# Patient Record
Sex: Female | Born: 1940 | Race: White | Hispanic: No | Marital: Single
Health system: Southern US, Community
[De-identification: ages and names within clinical notes are randomized; demographics above are authoritative.]

## PROBLEM LIST (undated history)

## (undated) DIAGNOSIS — M8580 Other specified disorders of bone density and structure, unspecified site: Secondary | ICD-10-CM

## (undated) DIAGNOSIS — I10 Essential (primary) hypertension: Secondary | ICD-10-CM

## (undated) DIAGNOSIS — Z923 Personal history of irradiation: Secondary | ICD-10-CM

## (undated) DIAGNOSIS — I472 Ventricular tachycardia: Secondary | ICD-10-CM

## (undated) DIAGNOSIS — I779 Disorder of arteries and arterioles, unspecified: Secondary | ICD-10-CM

## (undated) DIAGNOSIS — I4819 Other persistent atrial fibrillation: Secondary | ICD-10-CM

## (undated) DIAGNOSIS — C50919 Malignant neoplasm of unspecified site of unspecified female breast: Secondary | ICD-10-CM

## (undated) DIAGNOSIS — E785 Hyperlipidemia, unspecified: Secondary | ICD-10-CM

## (undated) DIAGNOSIS — I5030 Unspecified diastolic (congestive) heart failure: Secondary | ICD-10-CM

## (undated) DIAGNOSIS — I499 Cardiac arrhythmia, unspecified: Secondary | ICD-10-CM

## (undated) DIAGNOSIS — M159 Polyosteoarthritis, unspecified: Secondary | ICD-10-CM

## (undated) DIAGNOSIS — K219 Gastro-esophageal reflux disease without esophagitis: Secondary | ICD-10-CM

## (undated) DIAGNOSIS — I739 Peripheral vascular disease, unspecified: Secondary | ICD-10-CM

## (undated) DIAGNOSIS — C801 Malignant (primary) neoplasm, unspecified: Secondary | ICD-10-CM

## (undated) DIAGNOSIS — R000 Tachycardia, unspecified: Secondary | ICD-10-CM

## (undated) DIAGNOSIS — L409 Psoriasis, unspecified: Secondary | ICD-10-CM

## (undated) HISTORY — DX: Peripheral vascular disease, unspecified: I73.9

## (undated) HISTORY — DX: Essential (primary) hypertension: I10

## (undated) HISTORY — DX: Disorder of arteries and arterioles, unspecified: I77.9

## (undated) HISTORY — PX: FRACTURE SURGERY: SHX138

## (undated) HISTORY — PX: TONSILLECTOMY AND ADENOIDECTOMY: SHX28

## (undated) HISTORY — DX: Ventricular tachycardia: I47.2

## (undated) HISTORY — DX: Polyosteoarthritis, unspecified: M15.9

## (undated) HISTORY — PX: OOPHORECTOMY: SHX86

## (undated) HISTORY — DX: Gastro-esophageal reflux disease without esophagitis: K21.9

## (undated) HISTORY — DX: Malignant (primary) neoplasm, unspecified: C80.1

## (undated) HISTORY — DX: Other specified disorders of bone density and structure, unspecified site: M85.80

## (undated) HISTORY — PX: ABDOMINAL HYSTERECTOMY: SHX81

## (undated) HISTORY — DX: Tachycardia, unspecified: R00.0

## (undated) HISTORY — PX: BREAST EXCISIONAL BIOPSY: SUR124

## (undated) HISTORY — DX: Psoriasis, unspecified: L40.9

## (undated) HISTORY — DX: Unspecified diastolic (congestive) heart failure: I50.30

## (undated) HISTORY — DX: Other persistent atrial fibrillation: I48.19

## (undated) HISTORY — DX: Hyperlipidemia, unspecified: E78.5

---

## 1999-07-21 DIAGNOSIS — C801 Malignant (primary) neoplasm, unspecified: Secondary | ICD-10-CM

## 1999-07-21 DIAGNOSIS — C50919 Malignant neoplasm of unspecified site of unspecified female breast: Secondary | ICD-10-CM

## 1999-07-21 DIAGNOSIS — Z923 Personal history of irradiation: Secondary | ICD-10-CM

## 1999-07-21 HISTORY — DX: Malignant (primary) neoplasm, unspecified: C80.1

## 1999-07-21 HISTORY — PX: BREAST BIOPSY: SHX20

## 1999-07-21 HISTORY — DX: Personal history of irradiation: Z92.3

## 1999-07-21 HISTORY — PX: BREAST LUMPECTOMY: SHX2

## 1999-07-21 HISTORY — DX: Malignant neoplasm of unspecified site of unspecified female breast: C50.919

## 2000-08-17 ENCOUNTER — Encounter: Admission: RE | Admit: 2000-08-17 | Discharge: 2000-11-15 | Payer: Self-pay | Admitting: *Deleted

## 2002-07-20 HISTORY — PX: TOTAL HIP ARTHROPLASTY: SHX124

## 2004-06-03 ENCOUNTER — Ambulatory Visit: Payer: Self-pay | Admitting: Oncology

## 2004-06-25 ENCOUNTER — Ambulatory Visit: Payer: Self-pay | Admitting: Oncology

## 2004-08-12 ENCOUNTER — Ambulatory Visit: Payer: Self-pay | Admitting: Gastroenterology

## 2004-10-19 ENCOUNTER — Inpatient Hospital Stay (HOSPITAL_COMMUNITY): Admission: EM | Admit: 2004-10-19 | Discharge: 2004-10-24 | Payer: Self-pay | Admitting: Emergency Medicine

## 2004-10-20 ENCOUNTER — Encounter (INDEPENDENT_AMBULATORY_CARE_PROVIDER_SITE_OTHER): Payer: Self-pay | Admitting: Cardiology

## 2004-10-23 ENCOUNTER — Encounter (INDEPENDENT_AMBULATORY_CARE_PROVIDER_SITE_OTHER): Payer: Self-pay | Admitting: Specialist

## 2004-10-23 HISTORY — PX: CAROTID ENDARTERECTOMY: SUR193

## 2004-12-22 ENCOUNTER — Ambulatory Visit: Payer: Self-pay | Admitting: Oncology

## 2005-06-08 ENCOUNTER — Ambulatory Visit: Payer: Self-pay | Admitting: Oncology

## 2005-06-23 ENCOUNTER — Ambulatory Visit: Payer: Self-pay | Admitting: Oncology

## 2005-08-04 ENCOUNTER — Ambulatory Visit: Payer: Self-pay | Admitting: Oncology

## 2005-12-18 HISTORY — PX: SHOULDER SURGERY: SHX246

## 2006-01-04 ENCOUNTER — Ambulatory Visit: Payer: Self-pay | Admitting: General Practice

## 2006-02-01 ENCOUNTER — Ambulatory Visit: Payer: Self-pay | Admitting: Gastroenterology

## 2006-02-11 ENCOUNTER — Ambulatory Visit: Payer: Self-pay | Admitting: Gastroenterology

## 2006-03-26 ENCOUNTER — Ambulatory Visit: Payer: Self-pay | Admitting: General Practice

## 2006-05-17 ENCOUNTER — Ambulatory Visit: Payer: Self-pay | Admitting: Gastroenterology

## 2006-06-11 ENCOUNTER — Ambulatory Visit: Payer: Self-pay | Admitting: Unknown Physician Specialty

## 2006-10-19 ENCOUNTER — Inpatient Hospital Stay: Payer: Self-pay | Admitting: Unknown Physician Specialty

## 2006-10-19 ENCOUNTER — Other Ambulatory Visit: Payer: Self-pay

## 2006-10-19 HISTORY — PX: ANKLE FRACTURE SURGERY: SHX122

## 2006-12-07 ENCOUNTER — Encounter: Payer: Self-pay | Admitting: Internal Medicine

## 2006-12-08 ENCOUNTER — Emergency Department: Payer: Self-pay | Admitting: Emergency Medicine

## 2006-12-19 ENCOUNTER — Encounter: Payer: Self-pay | Admitting: Internal Medicine

## 2007-01-18 ENCOUNTER — Encounter: Payer: Self-pay | Admitting: Internal Medicine

## 2007-02-18 ENCOUNTER — Encounter: Payer: Self-pay | Admitting: Internal Medicine

## 2007-03-21 ENCOUNTER — Encounter: Payer: Self-pay | Admitting: Internal Medicine

## 2007-03-29 ENCOUNTER — Ambulatory Visit: Payer: Self-pay | Admitting: Gastroenterology

## 2007-06-29 ENCOUNTER — Ambulatory Visit: Payer: Self-pay | Admitting: Unknown Physician Specialty

## 2007-10-25 ENCOUNTER — Ambulatory Visit: Payer: Self-pay | Admitting: Vascular Surgery

## 2008-05-20 ENCOUNTER — Ambulatory Visit: Payer: Self-pay | Admitting: Oncology

## 2008-06-12 ENCOUNTER — Ambulatory Visit: Payer: Self-pay | Admitting: Oncology

## 2008-06-19 ENCOUNTER — Ambulatory Visit: Payer: Self-pay | Admitting: Oncology

## 2008-07-06 ENCOUNTER — Ambulatory Visit: Payer: Self-pay | Admitting: Unknown Physician Specialty

## 2008-07-20 ENCOUNTER — Ambulatory Visit: Payer: Self-pay | Admitting: Oncology

## 2008-07-24 ENCOUNTER — Ambulatory Visit: Payer: Self-pay | Admitting: Oncology

## 2008-08-20 ENCOUNTER — Ambulatory Visit: Payer: Self-pay | Admitting: Oncology

## 2008-10-18 ENCOUNTER — Ambulatory Visit: Payer: Self-pay | Admitting: Oncology

## 2008-10-23 ENCOUNTER — Ambulatory Visit: Payer: Self-pay | Admitting: Oncology

## 2008-11-17 ENCOUNTER — Ambulatory Visit: Payer: Self-pay | Admitting: Oncology

## 2009-01-29 ENCOUNTER — Ambulatory Visit: Payer: Self-pay | Admitting: Oncology

## 2009-02-17 ENCOUNTER — Ambulatory Visit: Payer: Self-pay | Admitting: Oncology

## 2009-03-12 ENCOUNTER — Ambulatory Visit: Payer: Self-pay | Admitting: Unknown Physician Specialty

## 2009-03-26 ENCOUNTER — Ambulatory Visit: Payer: Self-pay | Admitting: Gastroenterology

## 2009-04-19 ENCOUNTER — Ambulatory Visit: Payer: Self-pay | Admitting: Oncology

## 2009-04-23 ENCOUNTER — Ambulatory Visit: Payer: Self-pay | Admitting: Oncology

## 2009-05-20 ENCOUNTER — Ambulatory Visit: Payer: Self-pay | Admitting: Oncology

## 2009-09-23 ENCOUNTER — Ambulatory Visit: Payer: Self-pay | Admitting: Unknown Physician Specialty

## 2009-10-24 ENCOUNTER — Ambulatory Visit: Payer: Self-pay | Admitting: Vascular Surgery

## 2009-10-29 ENCOUNTER — Ambulatory Visit: Payer: Self-pay | Admitting: Oncology

## 2009-11-17 ENCOUNTER — Ambulatory Visit: Payer: Self-pay | Admitting: Oncology

## 2010-04-19 ENCOUNTER — Ambulatory Visit: Payer: Self-pay | Admitting: Oncology

## 2010-04-29 ENCOUNTER — Ambulatory Visit: Payer: Self-pay | Admitting: Oncology

## 2010-05-20 ENCOUNTER — Ambulatory Visit: Payer: Self-pay | Admitting: Oncology

## 2010-09-25 ENCOUNTER — Ambulatory Visit: Payer: PRIVATE HEALTH INSURANCE | Admitting: Unknown Physician Specialty

## 2010-12-02 NOTE — Procedures (Signed)
CAROTID DUPLEX EXAM   INDICATION:  Carotid disease.   HISTORY:  Diabetes:  No.  Cardiac:  No.  Hypertension:  Yes.  Smoking:  Previous.  Previous Surgery:  Left carotid endarterectomy on 10/23/2004.  CV History:  Currently asymptomatic.  Amaurosis Fugax No, Paresthesias No, Hemiparesis No.                                       RIGHT             LEFT  Brachial systolic pressure:         128               120  Brachial Doppler waveforms:         Normal            Normal  Vertebral direction of flow:        Antegrade         Antegrade  DUPLEX VELOCITIES (cm/sec)  CCA peak systolic                   87                97  ECA peak systolic                   60                84  ICA peak systolic                   80                80  ICA end diastolic                   29                34  PLAQUE MORPHOLOGY:                  Calcific  PLAQUE AMOUNT:                      Mild              None  PLAQUE LOCATION:                    ICA   IMPRESSION:  1. 1% to 39% stenosis of the right internal carotid artery.  2. Patent left carotid endarterectomy site with no left internal      carotid artery stenosis.  3. No significant change noted when compared to the previous      examination on 10/25/2007.   ___________________________________________  Di Kindle. Edilia Bo, M.D.   CH/MEDQ  D:  10/24/2009  T:  10/24/2009  Job:  [id number]

## 2010-12-02 NOTE — Procedures (Signed)
CAROTID DUPLEX EXAM   INDICATION:  Followup, carotid artery disease.   HISTORY:  Diabetes:  No.  Cardiac:  No.  Hypertension:  Yes.  Smoking:  Quit.  Previous Surgery:  Left CEA with DPA on 10/23/04 by Dr. Edilia Bo.  CV History:  Patient has had several syncopal episodes prior to CEA,  asymptomatic now.  Amaurosis Fugax No, Paresthesias No, Hemiparesis No                                       RIGHT             LEFT  Brachial systolic pressure:         122               118  Brachial Doppler waveforms:         Triphasic         Triphasic  Vertebral direction of flow:        Antegrade         Antegrade  DUPLEX VELOCITIES (cm/sec)  CCA peak systolic                   91                96  ECA peak systolic                   89                100  ICA peak systolic                   91                102  ICA end diastolic                   29                36  PLAQUE MORPHOLOGY:                  Calcified         None  PLAQUE AMOUNT:                      Mild              None  PLAQUE LOCATION:                    ICA               None   IMPRESSION:  1. Right internal carotid artery shows evidence of 1-39% stenosis.  2. No left internal carotid artery stenosis, status post carotid      endarterectomy with tortuosity noted distally.   ___________________________________________  Di Kindle. Edilia Bo, M.D.   AS/MEDQ  D:  10/25/2007  T:  10/25/2007  Job:  [id number]

## 2010-12-05 ENCOUNTER — Ambulatory Visit: Payer: PRIVATE HEALTH INSURANCE | Admitting: Gastroenterology

## 2010-12-05 NOTE — H&P (Signed)
[name], [name]            ACCOUNT NO.:  [id number]   MEDICAL RECORD NO.:  [id number]          PATIENT TYPE:  INP   LOCATION:  1831                         FACILITY:  [facility]   PHYSICIAN:  Lonia Blood, M.D.      DATE OF BIRTH:  March 23, 1941   DATE OF ADMISSION:  10/19/2004  DATE OF DISCHARGE:                                HISTORY & PHYSICAL   PRIMARY CARE PHYSICIAN:  Patient is unassigned. She is from [place].   PRESENTING COMPLAINT:  Falls, passing out, and pain in the right foot.   HISTORY OF PRESENT ILLNESS:  This is a 70 year old white female with history  of hypertension, breast cancer for the past four years, and arthritis who  presents with history of falls over the past 10 days. The patient has fallen  three times at least in the past four days all of which coming essentially  as passing out. She had no recollection of events at the time. His passing  out has lasted between 45 seconds to just a few seconds. Each time the  patient has either hit her head on the floor or one instance on the toilet  bowl. The patient has just started Baclofen last week. For the past six  months she has had spasms of her right hand for which she was referred to a  neurologist in [place], Dr. Quintella Reichert. He conducted nerve conduction studies  and EMG tests on her that indicated low normal, according to the patient. He  subsequently placed her on the baclofen last week which he started taking  just a week ago. The patient denied any dizziness, vertigo, nausea, and  vomiting. She says she takes water adequately. Per EMS record, the patient's  blood pressure was in the 80s over 40s when they went to her house. She,  however, denied feeling real dizziness. She is seeing what she calls  darkness or clouding of her vision, but that is about it.   PAST MEDICAL HISTORY:  1.  Hypertension.  2.  Osteoarthritis.  3.  Status post right hip replacement.  4.  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  5.  Psoriasis.  6.  GERD.  7.  Arthritis.  8.  History of breast cancer four years ago. She is currently on tamoxifen.   ALLERGIES:  The patient has no known drug allergies.   MEDICATIONS:  1.  Atacand/HCT 32/12.5 one daily.  2.  Metoprolol 100 mg two and a half tablets daily.  3.  Aciphex 20 mg daily.  4.  Tamoxifen 20 mg daily.  5.  Fosamax 70 mg daily.  6.  Baclofen 10 mg t.i.d.   SOCIAL HISTORY:  The patient lives in [place] with her husband. She has  been pretty active, driving, and she is a Education administrator. Denies any history of  tobacco use.  The patient takes one cocktail at night and one glass of wine  with dinner, but no other alcohol intake.   FAMILY HISTORY:  Mother died of old age.  Father died in his 86s from a  stroke. No family history  of cardiac disease. Some family members have had  cancer, leukemia to be exact.   REVIEW OF SYSTEMS:  Essentially as in HPI. All systems reviewed and stable  except for right leg pain and occasional hip pain. The patient also  complained of some headaches.   PHYSICAL EXAMINATION:  VITAL SIGNS: On exam here, temperature 97.1, blood  pressure 130/69, pulse 67, respiratory rate 20. Orthostatic check shows  blood pressure of 117/68 with a pulse of 70 lying and blood pressure of  104/63 with pulse of 71 sitting, and then 103/58 with a pulse of 59  standing. Respiratory rate 20 and saturation is 100% on room air.  GENERAL: The patient is alert and oriented, very pleasant, and in no acute  distress.  HEENT: PERRL. EOMI.  NECK: Supple with no JVD or lymphadenopathy. No carotid bruits appreciated.  RESPIRATORY: The patient has good air entry bilaterally. No wheezes or  rales.  CARDIOVASCULAR:  Regular rate and rhythm. No murmurs.  ABDOMEN: Soft, nontender, positive bowel sounds.  EXTREMITIES: No significant edema on the left. Mild swelling in the lateral  ankle on the right, which is tender to touch.   SKIN: The patient has psoriatic lesion in the right elbow and bruits.  Lesions on the left elbow.   LABORATORY DATA:  Sodium 140, potassium 4.0, chloride 112, BUN 34, glucose  100, bicarbonate 19. Hemoglobin 12.2.  Creatinine is 1.4. The rest of the  labs are pending.   Head CT without contrast is negative. Spinal CT and C-spine CT were also  negative. X-ray of the right foot showed  hairline fracture in the lateral  part of the malleoli which looks like an avulsion fracture.  EKG shows a  normal sinus rhythm with a rate of 65, normal intervals, normal axis, no ST-  T wave changes.   ASSESSMENT:  This is a 70 year old female with prior history of breast  cancer on tamoxifen and currently on baclofen and metoprolol who is  presenting with syncopal episode as well as a hairline fracture of the right  fibula. The differential diagnosis in this patient is numerous including  cardiac which may include arrhythmias, particularly drug-induced  bradyarrhythmias. It could be an MI, although less likely, also from a  cardiomyopathy.  1.  Neuro.  The patient could have TIAs, but if it is TIA must be of the      brainstem. It could be orthostasis secondary to either her drugs or      relative adrenal insufficiency since the patient has previously been on      steroids and she has had radiation. Other possibilities are some other      drugs themselves like baclofen, although her symptoms have started      before she got on the Baclofen. We will admit the patient to a telemetry      bed, hydrate her, check orthostatics daily. Check cardiac enzymes,      carotid Dopplers, TSH, and fasting lipid panel. Will also check a 2-D      echocardiogram and follow her telemetry tracing daily. Also, decrease      the dose of Baclofen to 5 mg t.i.d. since we cannot stop it abruptly.      Will put a foot splint. If all these fail to not show anything should     consider something like Holter monitoring versus tilt  table testing and      get EP involved.  2.  Avulsion fracture of the right  fibula. Will put the patient in a cast      and continue pain control.  Patient will also need possibly follow-up or      at least with her PCP on discharge.  3.  Hypertension. The patient is currently on metoprolol and Atacand/HCT.      The dose of  the metoprolol seems to be high. The patient is taking      about 250 mg and that will be the reason behind some of her falls. I      will put her on home medications so we will see if that is an issue that      may have been exacerbating and causing the falls. If that is the case we      will change her blood pressure medications and see if it resolves the      problem.  4.  Gastroesophageal reflux disease. I will put the patient on Protonix. She      is taking Aciphex at home.  5.  Osteoporosis. Will continue with the Fosamax and probably calcium      supplement also while she is here in the hospital.  6.  Psoriasis. The patient has been managing her psoriasis conservatively.      She currently does not have any symptoms that warrant except for the      mild lesion that is on the right elbow. I would therefore leave it alone      for now.      LG/MEDQ  D:  10/19/2004  T:  10/19/2004  Job:  [id number]

## 2010-12-05 NOTE — Op Note (Signed)
NAMEJOSILYN, [name]            ACCOUNT NO.:  [id number]   MEDICAL RECORD NO.:  [id number]          PATIENT TYPE:  INP   LOCATION:  4733                         FACILITY:  [facility]   PHYSICIAN:  Di Kindle. Edilia Bo, M.D.DATE OF BIRTH:  02-25-1941   DATE OF PROCEDURE:  10/23/2004  DATE OF DISCHARGE:                                 OPERATIVE REPORT   PREOPERATIVE DIAGNOSIS:  Greater than 80% left carotid stenosis.   POSTOPERATIVE DIAGNOSIS:  Greater than 80% left carotid stenosis.   PROCEDURE:  Left carotid endarterectomy with Dacron patch angioplasty.   SURGEON:  Di Kindle. Edilia Bo, M.D.   ASSISTANT:  Zadie Rhine, P.A.   ANESTHESIA:  General.   INDICATIONS:  This is a 70 year old woman who had been admitted after having  multiple episodes of falls and passing out. In addition, she had had some  unusual symptoms in both upper extremities including some episodes where she  was dropping things with her right hand. Part of her workup included a  carotid duplex scan which showed a greater than 80% left carotid stenosis  with no significant right carotid stenosis. Given the severity of the  stenosis on the left regardless of whether not it was potentially causing  her symptoms, left carotid endarterectomy was recommended in order to lower  her risk of future stroke. The procedure and potential complications  including but not limited to bleeding, nerve injury, stroke (periprocedural  risk 1-2%), MI, or other unpredictable medical problems were all discussed  with the patient and her husband.  All of their questions were answered and  they were agreeable to proceed.   TECHNIQUE:  The patient was taken to the operating room and received a  general anesthetic. The left neck was prepped and draped in the usual  sterile fashion. An incision was made along the anterior border of the  sternocleidomastoid and the dissection carried down to the common carotid  artery which was  dissected free and controlled with a Rumel tourniquet.  Internal carotid artery above the plaque was controlled with a blue vessel  loop. The external carotid artery was controlled with a blue vessel loop.  The patient was then heparinized. Clamps were then placed on the internal,  then the common, and the external carotid artery. Longitudinal arteriotomy  was made in the common carotid artery and this was extended through the  plaque into the internal carotid artery above the plaque. A #10 shunt was  placed into the internal carotid artery, back bled, and then placed into the  common carotid artery, and secured with a Rumel tourniquet. Flow was  reestablished through the shunt. An endarterectomy plane was established  proximally and the plaque was sharply divided. Eversion endarterectomy was  performed of the external carotid artery. Distally, there was a nice taper  in the plaque and no tacking sutures were required. The artery was irrigated  with copious amounts of heparin and dextran and all loose debris removed.  The Dacron patch was then sewn using continuous 6-0 Prolene suture. Prior to  completing the patch closure, the shunt was removed. The arteries were back  bled  and flushed appropriately and the patch closure completed. Flow was  reestablished first to the external carotid artery and then to the internal  carotid artery. At the completion, there was an excellent Doppler signal  distal to the patch and a good pulse. Hemostasis was obtained of the wounds  and the heparin was partially reversed with Protamine. The wound was closed  with a deep layer of 3-0 Vicryl and the platysma was closed with  running 3-0 Vicryl. The skin was closed with a 4-0 subcuticular stitch. A  sterile dressing was applied. The patient tolerated the procedure well and  was transferred to the recovery room in satisfactory condition. All needle  and sponge counts were correct.      CSD/MEDQ  D:   10/23/2004  T:  10/23/2004  Job:  [id number]   cc:   Dr. Garnette Czech

## 2010-12-05 NOTE — Consult Note (Signed)
[name], [name]            ACCOUNT NO.:  [id number]   MEDICAL RECORD NO.:  [id number]          PATIENT TYPE:  INP   LOCATION:  4733                         FACILITY:  [facility]   PHYSICIAN:  Di Kindle. Edilia Bo, M.D.DATE OF BIRTH:  [birth year]   DATE OF CONSULTATION:  10/21/2004  DATE OF DISCHARGE:                                   CONSULTATION   REASON FOR CONSULTATION:  Greater than 80% left carotid stenosis.   HISTORY:  This is a pleasant 70 year old right-handed woman who was admitted  on October 20, 2003 as she had been having multiple episodes of passing out and  falling. Over the last 10 days she has fallen multiple times; at least three  times in the last four days. She has no recollection of these events and she  usually awakens fairly quickly. Most recently, she injured her right leg  when she fell. She has also had problems with spasms in both hands and is  being seen by a neurologist in [place]. She has apparently had fairly  extensive workup for this and had been recently placed on baclofen.   On my questioning she denies any history of previous stroke, TIAs,  expressive or receptive aphasia, or amaurosis fugax.   PAST MEDICAL HISTORY:  1.  Significant for hypertension.  2.  Osteoporosis.  3.  History of breast cancer treated four years ago.  4.  She denies any history of diabetes, hypercholesterolemia, history of      previous myocardial infarction, history of congestive heart failure, or      history of emphysema.   PAST SURGICAL HISTORY:  Significant for previous right hip replacement two  years ago. She also had a left breast lumpectomy with postoperative  radiation therapy for breast cancer four years ago.   SOCIAL HISTORY:  She is married and has four children. She is a Education administrator. She  quit tobacco seven years ago. She had been smoking one pack per day for over  40 years.   ALLERGIES:  Patient that has no known drug allergies.   MEDICATIONS:  1.   Baclofen 10 mg p.o. t.i.d.  2.  Fosamax 70 mg daily.  3.  Tamoxifen 20 mg daily.  4.  Aciphex 20 mg daily.  5.  Metoprolol 100 mg two and a half tablets q.d.  6.  Atacand/HCT 32/12.51 mg with 1 q.d.   FAMILY HISTORY:  There is no history of premature cardiovascular disease.  Mother died of old age. Her father died in his 70s from stroke.   REVIEW OF SYSTEMS:  CARDIOVASCULAR:  She has had no recent chest pain, chest  pressure, palpitations, or arrhythmias. PULMONARY:  She had no bronchitis,  asthma or wheezing. GI: She does have a history of gastroesophageal reflux  disease. SKIN: She has a history of psoriasis. GU: She had no dysuria or  frequency. VASCULAR: She had no claudication, rest pain or nonhealing ulcer.  She has had some leg cramps at night. NEUROLOGICAL:  She has had blackouts  as above but no dizziness, headaches or seizures. Review of systems is  otherwise unremarkable.   PHYSICAL EXAMINATION:  VITAL SIGNS:  Temperature is 99.3, heart rate 86,  blood pressure 126/80.  NECK:  I did not detect any carotid bruits. There is no cervical  lymphadenopathy.  LUNGS:  Lungs are clear bilaterally to auscultation.  CARDIOVASCULAR:  She has a regular rate and rhythm.  ABDOMEN: Soft and nontender. I cannot palpate an aneurysm.  PULSES:  She has palpable femoral and popliteal pulses bilaterally.  EXTREMITIES:  She has a splint on her right leg but a warm, well-perfused  foot. On the left side, she is a palpable dorsalis pedis pulse. Feet are  warm and well-perfused.  NEUROLOGIC: Nonfocal.   LABORATORY DATA:  Carotid duplex scan shows a greater than a 80% left  carotid stenosis. End-diastolic velocity of 140 cm/sec would suggest  probably a 90% stenosis. There is no significant right carotid stenosis and  both vertebral arteries are patent with normally directed flow.   RECOMMENDATIONS:  I explained to the patient that I did not think any of her  symptoms could be attributed to her  left carotid stenosis. Certainly the  cramps in the hands would not fit with this and given that her vertebral  arteries area patent and she has no carotid disease on the right, I do not  think her passing out could be related to this. On my history, she does not  describe any history of TIAs, although on the note, there is some mention of  some transient right arm weakness. Regardless, given that the stenosis  greater than 80%, I have recommended left carotid endarterectomy in order to  lower her risk of future stroke. I have discussed the indications for  surgery, the potential complications including but not limited to bleeding,  perioperative stroke (1-2% risk), nerve injury, MI or, or other  unpredictable medical problems. As on my history, she is asymptomatic. If  her syncope workup needs to be completed before then we could schedule her  surgery electively once this is complete. If on the other hand it is felt  that she is safe for surgery and she would like to proceed, we could  potentially do her surgery later this week. I have, looking at her MRA, does  not look like she is on aspirin so I have added aspirin. She would like to  discuss this with her husband and I will discuss this more with her  tomorrow.      CSD/MEDQ  D:  10/21/2004  T:  10/21/2004  Job:  [id number]

## 2010-12-05 NOTE — Discharge Summary (Signed)
[name], [name]            ACCOUNT NO.:  [id number]   MEDICAL RECORD NO.:  [id number]          PATIENT TYPE:  INP   LOCATION:  3308                         FACILITY:  [facility]   PHYSICIAN:  Danae Chen, M.D.DATE OF BIRTH:  07-28-1940   DATE OF ADMISSION:  10/19/2004  DATE OF DISCHARGE:  10/24/2004                                 DISCHARGE SUMMARY   DISCHARGE DIAGNOSIS:  1.  Left carotid endarterectomy for left internal carotid artery stenosis.  2.  History of multiple falls in the past several weeks.  3.  Nondisplaced fracture of the distal right fibula.  4.  History of osteoporosis.  5.  History of  hypertension.  6.  History of breast cancer treated four years ago and currently on      Tamoxifen.  7.  Hypotension status post surgery, resolved.   DISCHARGE MEDICATIONS:  The patient has had some changes made to her  discharge medicines, she will be changed to Lopressor 12.5 mg p.o. b.i.d.,  new medication added is aspirin 325 mg daily, Fosamax 70 mg p.o. weekly,  AcipHex 20 mg p.o. daily, Tamoxifen 20 mg p.o. daily, Percocet 5/325 1-2  tablets every 4-6 hours as needed for moderate to severe pain, Tylox 1-2  q.4h. as needed for mild pain.  The patient is to discontinue her Atacand  and hydrochlorothiazide until she sees her primary care physician.   DISCHARGE INSTRUCTIONS:  She has a follow up appointment with Dr.  Waverly Ferrari on Wednesday, Nov 19, 2004, at 12:40 p.m.  The patient is  to call her primary care physician, Dr. Neldon Mc for follow up  appointment in one week time where she should be rechecked with her blood  pressure.  Also, to follow up with Dr. Ernest Pine, her primary orthopedist, in  one weeks time for follow up of her nondisplaced fibular fracture.   CONSULTATIONS:  Di Kindle. Edilia Bo, M.D., [facility]   PROCEDURE:  1.  Carotid Dopplers showing no evidence of significant ICA stenosis on the      right and 80% stenosis on the left with  bilateral vertebral antegrade      flow, October 20, 2004.  2.  Head CT with contrast showing no acute abnormality on October 20, 2004.  3.  CT of cervical spine without contrast showing no acute fractures, some      mild disc degeneration and spondylosis.  4.  MRI of the brain with and without contrast showing atrophy and mild      hemispheric small vessel disease, no acute process.  5.  MRA done on October 20, 2004, shows normal intracranial MR angiography, no      posterior circulation insufficiency.  6.  3-view of the right ankle done on October 20, 2004, shows nondisplaced      fracture of the distal fibula, medial malleolus intact, ankle mortise      maintained with some lateral soft tissue swelling.   HOSPITAL COURSE:  The patient is a very pleasant 70 year old white female  who was admitted after having several episodes of syncopal type episodes and  injuries sustained due to this.  She had some right foot pain, x-ray showing  the nondisplaced fracture as above.  A workup ensued and carotid Dopplers  did show significant stenosis of the left internal carotid artery.  Therefore, [facility] was consulted.  They did recommend a carotid endarterectomy  and that was performed on October 21, 2004, without complications.  During her  hospital stay, the patient did not have any recurrent symptoms of syncope,  headache, vision or memory loss.  It was felt that her frequent falls may  have been attributable to a combination of her muscle relaxant with some  orthostatic hypotension from low blood pressure.  Therefore, she was taken  off her ARB and diuretic and her beta blocker dosage was decreased.  An  aspirin was also added to her regimen.  The patient tolerated this well, in  fact, had hypotension that did require vasopressor support prior to  discharge.  At the time of discharge, however, her condition is improved,  she has been weaned off her Dopamine, ambulating well, and blood pressure is  normotensive.   Further instructions on wound care per CVTS, she is to keep  the incision site washed with warm soap and water and to notify the CVTS  office if she notices any drainage or opening of the incision site.  She is  not to drive or lift any heavy objects until she is followed up with CV[facility]  and she is also to ambulate 3-4 times a day and to progress as tolerated.  Regarding her nondisplaced fibular fracture, the patient may ambulate on it  with the boot walker she has currently.  Her primary orthopedist may elect  to repeat x-ray the extremity on follow up and make other recommendations as  needed at the time.  In regards to her follow up with the primary care  physician and her blood pressure medications, adjustments can be made based  on how she is responding to this new regimen of low dose beta blocker for  her hypertension.  In addition, we did discontinue the Baclofen to avoid  that contributing to any further falls.  The patient is cautioned to use her  Percocet and Tylox, that this may also cause some decreased mobility, and to  be careful as the patient does have a history of right hip replacement.  At  the time of discharge, the patient is alert and oriented, ambulatory.  There  are no complaints of headache, there is some slight discomfort over her  surgical site.  She is afebrile, blood pressure 115/50, pulse between 75 and  80, she is saturating 100% on room air.  Lungs clear.  Heart rate is  regular.  Her abdomen is soft.  Neurologically, alert and oriented as noted.  She has 5/5 strength in her bilateral upper and lower extremities.  She has  a normal gait.  No focal neurological abnormalities noted.      RLK/MEDQ  D:  10/24/2004  T:  10/24/2004  Job:  [id number]   cc:   Francesco Sor  [address]  [place]  [place] [address]  Fax: [phone]   Neldon Mc, M.D.

## 2011-04-06 ENCOUNTER — Encounter: Payer: Self-pay | Admitting: Vascular Surgery

## 2011-04-29 ENCOUNTER — Other Ambulatory Visit: Payer: Self-pay

## 2011-04-29 ENCOUNTER — Ambulatory Visit: Payer: Self-pay | Admitting: Vascular Surgery

## 2011-04-30 ENCOUNTER — Ambulatory Visit: Payer: PRIVATE HEALTH INSURANCE | Admitting: Oncology

## 2011-05-21 ENCOUNTER — Ambulatory Visit: Payer: PRIVATE HEALTH INSURANCE | Admitting: Oncology

## 2011-05-26 ENCOUNTER — Encounter: Payer: Self-pay | Admitting: Vascular Surgery

## 2011-05-27 ENCOUNTER — Ambulatory Visit (INDEPENDENT_AMBULATORY_CARE_PROVIDER_SITE_OTHER): Payer: Medicare Other | Admitting: Vascular Surgery

## 2011-05-27 ENCOUNTER — Encounter: Payer: Self-pay | Admitting: Vascular Surgery

## 2011-05-27 ENCOUNTER — Other Ambulatory Visit (INDEPENDENT_AMBULATORY_CARE_PROVIDER_SITE_OTHER): Payer: Medicare Other | Admitting: Vascular Surgery

## 2011-05-27 VITALS — BP 149/82 | HR 64 | Resp 12 | Ht 66.5 in | Wt 128.0 lb

## 2011-05-27 DIAGNOSIS — Z48812 Encounter for surgical aftercare following surgery on the circulatory system: Secondary | ICD-10-CM

## 2011-05-27 DIAGNOSIS — I6529 Occlusion and stenosis of unspecified carotid artery: Secondary | ICD-10-CM | POA: Insufficient documentation

## 2011-05-27 NOTE — Progress Notes (Signed)
[facility]  Patient name: [name] MRN: [id number] DOB: 12-May-1941 Sex: female  CC: followup of carotid disease  HPI: [name] is a 70 y.o. female who underwent a left carotid endarterectomy in April of 2006. She comes in for a routine postoperative duplex scan. As I last saw her 18 months ago she denies any history of stroke, TIAs, expressive or receptive aphasia, or amaurosis fugax. She has a history of hypertension and hyperlipidemia both of which are stable and her current medications.  Unfortunately her husband has developed Alzheimer's and she spends a fair amount of time taking care of him.  Past Medical History  Diagnosis Date  . Arthritis   . Hyperlipidemia   . Hypertension   . GERD (gastroesophageal reflux disease)   . Osteoporosis   . Cancer     breast    History reviewed. No pertinent family history.  SOCIAL HISTORY: History  Substance Use Topics  . Smoking status: Former Smoker -- 1.0 packs/day for 40 years    Types: Cigarettes    Quit date: 07/21/1995  . Smokeless tobacco: Not on file  . Alcohol Use: 7.0 oz/week    14 drink(s) per week    No Known Allergies  Current Outpatient Prescriptions  Medication Sig Dispense Refill  . Calcium Carbonate (CALTRATE 600 PO) Take by mouth 2 (two) times daily.        . Cholecalciferol (VITAMIN D PO) Take by mouth daily.        Marland Kitchen esomeprazole (NEXIUM) 40 MG capsule Take 40 mg by mouth daily before breakfast.        . lisinopril (PRINIVIL,ZESTRIL) 20 MG tablet Take 10 mg by mouth daily.       . metoprolol (TOPROL-XL) 50 MG 24 hr tablet Take 50 mg by mouth 2 (two) times daily.       . Multiple Vitamin (MULTIVITAMIN) capsule Take 1 capsule by mouth daily.        . ranitidine (ZANTAC) 150 MG tablet Take 150 mg by mouth 2 (two) times daily.          REVIEW OF SYSTEMS: Arly.Keller ] denotes positive finding; [  ] denotes negative finding CARDIOVASCULAR:  [ ]  chest pain   [ ]  chest pressure    [ ]  palpitations   [ ]  orthopnea   [ ]  dyspnea on exertion   [ ]  claudication   [ ]  rest pain   [ ]  DVT   [ ]  phlebitis PULMONARY:   [ ]  productive cough   [ ]  asthma   [ ]  wheezing NEUROLOGIC:   [ ]  weakness  [ ]  paresthesias  [ ]  aphasia  [ ]  amaurosis  [ ]  dizziness HEMATOLOGIC:   [ ]  bleeding problems   [ ]  clotting disorders MUSCULOSKELETAL:  [ ]  joint pain   [ ]  joint swelling GASTROINTESTINAL: [ ]   blood in stool  [ ]   hematemesis GENITOURINARY:  [ ]   dysuria  [ ]   hematuria PSYCHIATRIC:  [ ]  history of major depression INTEGUMENTARY:  [ ]  rashes  [ ]  ulcers CONSTITUTIONAL:  [ ]  fever   [ ]  chills  PHYSICAL EXAM: Filed Vitals:   05/27/11 1219 05/27/11 1222  BP: 149/81 149/82  Pulse: 67 64  Resp: 12 12  Height: 5' 6.5" (1.689 m)   Weight: 128 lb (58.06 kg)   SpO2: 99%    Body mass index is 20.35 kg/(m^2). GENERAL: The patient is a well-nourished female, in no  acute distress. The vital signs are documented above. CARDIOVASCULAR: There is a regular rate and rhythm without significant murmur appreciated. I do not detect any carotid bruits. PULMONARY: There is good air exchange bilaterally without wheezing or rales. ABDOMEN: Soft and non-tender with normal pitched bowel sounds.  MUSCULOSKELETAL: There are no major deformities or cyanosis. NEUROLOGIC: No focal weakness or paresthesias are detected. SKIN: There are no ulcers or rashes noted. PSYCHIATRIC: The patient has a normal affect.  DATA:  I have independently interpreted her carotid duplex scan. Shows no evidence of recurrent carotid stenosis on the left. She has minimal plaque on the right side with no significant stenosis on the right. Both vertebral arteries are patent with normally directed flow.  MEDICAL ISSUES: Overall pleased with her progress. I have ordered a followup carotid duplex scan in 18 months. I'll see her back at that time. She knows to call sooner if she has problems.  Chamya Hunton S [facility] [place] Office: [phone]

## 2011-06-03 NOTE — Procedures (Unsigned)
CAROTID DUPLEX EXAM  INDICATION:  Follow up carotid stenosis.  HISTORY: Diabetes:  No. Cardiac:  No. Hypertension:  Yes. Smoking:  Previous. Previous Surgery:  Left carotid endarterectomy on 10/23/2004. CV History:  Asymptomatic. Amaurosis Fugax No, Paresthesias No, Hemiparesis No.                                      RIGHT             LEFT Brachial systolic pressure:         170               160 Brachial Doppler waveforms:         WNL               WNL Vertebral direction of flow:        Antegrade         Antegrade DUPLEX VELOCITIES (cm/sec) CCA peak systolic                   104               91 ECA peak systolic                   115               94 ICA peak systolic                   71                78 ICA end diastolic                   16                22 PLAQUE MORPHOLOGY:                  Calcified         Not visualized PLAQUE AMOUNT:                      Mild              Not visualized PLAQUE LOCATION:                    CCA/ICA           Not visualized  IMPRESSION: 1. Right internal carotid artery stenosis present in the 1% to 39%     range. 2. Left internal carotid artery is patent with history of     endarterectomy, no hyperplasia or hemodynamically significant     plaque is identified. 3. Bilateral external carotid arteries are patent. 4. Bilateral vertebral arteries are patent and antegrade, essentially     unchanged since previous study on 04/07/201.       ___________________________________________ Di Kindle. Edilia Bo, M.D.  SH/MEDQ  D:  05/27/2011  T:  05/27/2011  Job:  [id number]

## 2011-08-05 ENCOUNTER — Ambulatory Visit (INDEPENDENT_AMBULATORY_CARE_PROVIDER_SITE_OTHER): Payer: Medicare Other | Admitting: Internal Medicine

## 2011-08-05 ENCOUNTER — Encounter: Payer: Self-pay | Admitting: Internal Medicine

## 2011-08-05 DIAGNOSIS — E785 Hyperlipidemia, unspecified: Secondary | ICD-10-CM | POA: Insufficient documentation

## 2011-08-05 DIAGNOSIS — I6529 Occlusion and stenosis of unspecified carotid artery: Secondary | ICD-10-CM | POA: Diagnosis not present

## 2011-08-05 DIAGNOSIS — M949 Disorder of cartilage, unspecified: Secondary | ICD-10-CM

## 2011-08-05 DIAGNOSIS — M8580 Other specified disorders of bone density and structure, unspecified site: Secondary | ICD-10-CM

## 2011-08-05 DIAGNOSIS — I10 Essential (primary) hypertension: Secondary | ICD-10-CM | POA: Insufficient documentation

## 2011-08-05 DIAGNOSIS — K219 Gastro-esophageal reflux disease without esophagitis: Secondary | ICD-10-CM

## 2011-08-05 DIAGNOSIS — M810 Age-related osteoporosis without current pathological fracture: Secondary | ICD-10-CM | POA: Insufficient documentation

## 2011-08-05 DIAGNOSIS — M899 Disorder of bone, unspecified: Secondary | ICD-10-CM

## 2011-08-05 NOTE — Assessment & Plan Note (Signed)
Asked her to start 81mg  of ASA daily

## 2011-08-05 NOTE — Progress Notes (Signed)
Subjective:    Patient ID: [name], female    DOB: October 16, 1940, 71 y.o.   MRN: [id number]  HPI Transferring care---Dr Lin Givens has moved  Has HTN for many years Has been under control meds first about 30 years ago  Psoriasis for many years Currently under control Mostly on buttocks and elbows---controlled with topical Rx  Has had long standing hyperlipidemia HDL is 74 Had been on statin but off meds now Low risk profile  GERD is also long standing Controlled with the nexium  Current Outpatient Prescriptions on File Prior to Visit  Medication Sig Dispense Refill  . Calcium Carbonate (CALTRATE 600 PO) Take by mouth 2 (two) times daily.        . Cholecalciferol (VITAMIN D PO) Take by mouth 2 (two) times daily.       Marland Kitchen esomeprazole (NEXIUM) 40 MG capsule Take 40 mg by mouth daily before breakfast.        . lisinopril (PRINIVIL,ZESTRIL) 20 MG tablet Take 10 mg by mouth daily.       . Multiple Vitamin (MULTIVITAMIN) capsule Take 1 capsule by mouth daily.          No Known Allergies  Past Medical History  Diagnosis Date  . Osteoarthrosis involving, or with mention of more than one site, but not specified as generalized, multiple sites   . Cancer     breast  . GERD (gastroesophageal reflux disease)   . Hyperlipidemia   . Hypertension   . Osteopenia     Past Surgical History  Procedure Date  . Carotid endarterectomy 10/23/2004  . Total hip arthroplasty 2004    right  . Breast lumpectomy 2001    left  . Shoulder surgery 6/07    left  . Abdominal hysterectomy   . Ankle fracture surgery 4/08    left---hardware still in place    Family History  Problem Relation Age of Onset  . Hypertension Brother   . Heart disease Neg Hx   . Diabetes Neg Hx     History   Social History  . Marital Status: Married    Spouse Name: N/A    Number of Children: N/A  . Years of Education: N/A   Occupational History  . Marketing     Retired   Social History Main Topics    . Smoking status: Former Smoker -- 1.0 packs/day for 40 years    Types: Cigarettes    Quit date: 07/21/1995  . Smokeless tobacco: Never Used  . Alcohol Use: 7.0 oz/week    14 drink(s) per week  . Drug Use:   . Sexually Active:    Other Topics Concern  . Not on file   Social History Narrative   1 natural child3 adopted childrenArtist---still teaches water colorsHusband has AltzheimersHas living willDaughter Amy is health care POARequests DNR No tube feeds if cognitively unaware   Review of Systems  Constitutional: Negative for fatigue and unexpected weight change.       Weight up and down some---stress with husband Wears seat belt  HENT: Negative for hearing loss, dental problem and tinnitus.   Eyes: Negative for visual disturbance.       Had cataract surgery bilat--with IOL  Respiratory: Negative for cough, chest tightness and shortness of breath.   Cardiovascular: Negative for chest pain, palpitations and leg swelling.  Gastrointestinal: Negative for nausea, vomiting, constipation and blood in stool.       Heartburn controlled on the nexium  Genitourinary: Negative for dysuria and  difficulty urinating.  Musculoskeletal: Positive for arthralgias. Negative for joint swelling.       Uses tylenol prn only  Neurological: Negative for dizziness, syncope, weakness, light-headedness and numbness.  Psychiatric/Behavioral: Positive for sleep disturbance. Negative for dysphoric mood. The patient is not nervous/anxious.        Sleep is variable       Objective:   Physical Exam  Constitutional: She is oriented to person, place, and time. She appears well-developed and well-nourished. No distress.  Neck: Normal range of motion. Neck supple. No thyromegaly present.  Cardiovascular: Normal rate, regular rhythm, normal heart sounds and intact distal pulses.  Exam reveals no gallop.   No murmur heard. Pulmonary/Chest: Effort normal and breath sounds normal. No respiratory distress. She has  no wheezes. She has no rales.  Abdominal: Soft. There is no tenderness.  Musculoskeletal: She exhibits no edema and no tenderness.  Lymphadenopathy:    She has no cervical adenopathy.  Neurological: She is alert and oriented to person, place, and time.  Psychiatric: She has a normal mood and affect. Her behavior is normal. Judgment and thought content normal.          Assessment & Plan:

## 2011-08-05 NOTE — Assessment & Plan Note (Signed)
BP Readings from Last 3 Encounters:  08/05/11 140/80  05/27/11 149/82   Good control  No changes Labs fine in October---reviewed

## 2011-08-05 NOTE — Assessment & Plan Note (Signed)
Controlled with the nexium Will continue

## 2011-08-05 NOTE — Assessment & Plan Note (Signed)
HDL 74 LDL 136  I discussed her vascular history and that statin is indicated She is not excited about going back on but will consider

## 2011-08-05 NOTE — Assessment & Plan Note (Signed)
Has DEXA coming up On vitamin D and calcium Has weight bearing exercise

## 2011-08-06 DIAGNOSIS — N951 Menopausal and female climacteric states: Secondary | ICD-10-CM | POA: Diagnosis not present

## 2011-08-19 ENCOUNTER — Telehealth: Payer: Self-pay | Admitting: Internal Medicine

## 2011-08-19 NOTE — Telephone Encounter (Signed)
Patient states she had tetanus in Feb 2010 and is not going to take a shingles shot because she hasn't had chicken pox.

## 2011-09-03 DIAGNOSIS — H353190 Nonexudative age-related macular degeneration, unspecified eye, stage unspecified: Secondary | ICD-10-CM | POA: Diagnosis not present

## 2011-10-27 ENCOUNTER — Telehealth: Payer: Self-pay | Admitting: Internal Medicine

## 2011-10-27 DIAGNOSIS — Z853 Personal history of malignant neoplasm of breast: Secondary | ICD-10-CM

## 2011-10-27 NOTE — Telephone Encounter (Signed)
Pt is calling to set up her Mammogram. She is a breast cancer survivor, she will need a Diagnostic order put in. She goes to [facility].

## 2011-10-28 DIAGNOSIS — Z853 Personal history of malignant neoplasm of breast: Secondary | ICD-10-CM | POA: Insufficient documentation

## 2011-10-28 NOTE — Telephone Encounter (Signed)
Order put in.

## 2011-11-23 ENCOUNTER — Ambulatory Visit: Payer: Self-pay | Admitting: Gastroenterology

## 2011-11-23 DIAGNOSIS — K2270 Barrett's esophagus without dysplasia: Secondary | ICD-10-CM | POA: Diagnosis not present

## 2011-11-23 DIAGNOSIS — Z79899 Other long term (current) drug therapy: Secondary | ICD-10-CM | POA: Diagnosis not present

## 2011-11-23 DIAGNOSIS — M87059 Idiopathic aseptic necrosis of unspecified femur: Secondary | ICD-10-CM | POA: Diagnosis not present

## 2011-11-23 DIAGNOSIS — D649 Anemia, unspecified: Secondary | ICD-10-CM | POA: Diagnosis not present

## 2011-11-23 DIAGNOSIS — K219 Gastro-esophageal reflux disease without esophagitis: Secondary | ICD-10-CM | POA: Diagnosis not present

## 2011-11-23 DIAGNOSIS — I10 Essential (primary) hypertension: Secondary | ICD-10-CM | POA: Diagnosis not present

## 2011-11-23 DIAGNOSIS — K449 Diaphragmatic hernia without obstruction or gangrene: Secondary | ICD-10-CM | POA: Diagnosis not present

## 2011-11-23 DIAGNOSIS — Z853 Personal history of malignant neoplasm of breast: Secondary | ICD-10-CM | POA: Diagnosis not present

## 2011-11-23 DIAGNOSIS — L408 Other psoriasis: Secondary | ICD-10-CM | POA: Diagnosis not present

## 2011-11-23 DIAGNOSIS — Z8261 Family history of arthritis: Secondary | ICD-10-CM | POA: Diagnosis not present

## 2011-11-23 DIAGNOSIS — B3781 Candidal esophagitis: Secondary | ICD-10-CM | POA: Diagnosis not present

## 2011-11-23 DIAGNOSIS — K209 Esophagitis, unspecified without bleeding: Secondary | ICD-10-CM | POA: Diagnosis not present

## 2011-11-23 DIAGNOSIS — B3784 Candidal otitis externa: Secondary | ICD-10-CM | POA: Diagnosis not present

## 2011-11-23 DIAGNOSIS — Z806 Family history of leukemia: Secondary | ICD-10-CM | POA: Diagnosis not present

## 2011-11-23 DIAGNOSIS — R1310 Dysphagia, unspecified: Secondary | ICD-10-CM | POA: Diagnosis not present

## 2011-11-23 DIAGNOSIS — E785 Hyperlipidemia, unspecified: Secondary | ICD-10-CM | POA: Diagnosis not present

## 2011-11-23 LAB — KOH PREP

## 2011-11-26 LAB — PATHOLOGY REPORT

## 2011-11-27 ENCOUNTER — Ambulatory Visit: Payer: Self-pay | Admitting: Internal Medicine

## 2011-11-27 ENCOUNTER — Encounter: Payer: Self-pay | Admitting: Internal Medicine

## 2011-11-27 DIAGNOSIS — Z853 Personal history of malignant neoplasm of breast: Secondary | ICD-10-CM | POA: Diagnosis not present

## 2011-11-30 ENCOUNTER — Telehealth: Payer: Self-pay

## 2011-11-30 MED ORDER — LORAZEPAM 0.5 MG PO TABS: 0.2500 mg | ORAL_TABLET | Freq: Two times a day (BID) | ORAL | Status: DC | PRN

## 2011-11-30 NOTE — Telephone Encounter (Signed)
Spoke with patient and advised results rx called into pharmacy  

## 2011-11-30 NOTE — Telephone Encounter (Signed)
Pt said attending Alzheimers support group; pt  needs med for anxiousness and depression. Pt said feelings come and go depending on pts husbands alzheimers condition. Pt tried wellbutrin years ago and that caused difficulty sleeping and nightmares. [facility] is pharmacy and pt can be reached at [phone].

## 2011-11-30 NOTE — Telephone Encounter (Signed)
Please let her know that I need to see her in office to discuss before I would start any daily anxiety med. If she would like a small quantity of a tranquilizer for when anxiety really flares, okay to send #6 of lorazepam 0.5mg  With no refill 1/2-1 tab po bid prn for anxiety  Have her set up appt in the next couple of weeks to discuss

## 2011-12-01 ENCOUNTER — Ambulatory Visit (INDEPENDENT_AMBULATORY_CARE_PROVIDER_SITE_OTHER): Payer: Medicare Other | Admitting: Internal Medicine

## 2011-12-01 ENCOUNTER — Encounter: Payer: Self-pay | Admitting: Internal Medicine

## 2011-12-01 ENCOUNTER — Encounter: Payer: Self-pay | Admitting: *Deleted

## 2011-12-01 VITALS — BP 122/62 | HR 80 | Temp 97.2°F | Ht 65.5 in | Wt 125.0 lb

## 2011-12-01 DIAGNOSIS — F411 Generalized anxiety disorder: Secondary | ICD-10-CM

## 2011-12-01 DIAGNOSIS — F418 Other specified anxiety disorders: Secondary | ICD-10-CM

## 2011-12-01 DIAGNOSIS — F39 Unspecified mood [affective] disorder: Secondary | ICD-10-CM | POA: Insufficient documentation

## 2011-12-01 NOTE — Assessment & Plan Note (Signed)
She did try lorazepam 0.25mg  last night----didn't see much effect I don't think her mood issues are amenable to medication treatment No ongoing depression or GAD Has clear caregiver stress  Counseled about half of 25 minute visit on reducing caregiver stress No daily meds Will continue the lorazepam on an as needed basis  Gave caregiver bill of rights

## 2011-12-01 NOTE — Progress Notes (Signed)
Subjective:    Patient ID: [name], female    DOB: 08/15/1940, 71 y.o.   MRN: [id number]  HPI Having stress with caregiving for husband with Altzheimer's He gets upset when she has to drive (he will reach across her to honk horn) Had EGD for Barrett's and she was fearful---"what would happen if something happened to me"  Support group at the [facility]. 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 aspirin 81 MG tablet Take 81 mg by mouth daily.      . Calcium Carbonate (CALTRATE 600 PO) Take by mouth 2 (two) times daily.        . Cholecalciferol (VITAMIN D PO) Take by mouth 2 (two) times daily.       Marland Kitchen esomeprazole (NEXIUM) 40 MG capsule Take 40 mg by mouth daily before breakfast.        . lisinopril (PRINIVIL,ZESTRIL) 20 MG tablet Take 10 mg by mouth daily.       Marland Kitchen LORazepam (ATIVAN) 0.5 MG tablet Take 0.5-1 tablets (0.25-0.5 mg total) by mouth 2 (two) times daily as needed for anxiety.  6 tablet  0  . metoprolol (LOPRESSOR) 50 MG tablet Take 50 mg by mouth 2 (two) times daily.       . Multiple Vitamin (MULTIVITAMIN) capsule Take 1 capsule by mouth daily.          Allergies  Allergen Reactions  . Wellbutrin (Bupropion) Other (See Comments)    problems sleeping and nightmares    Past Medical History  Diagnosis Date  . Osteoarthrosis involving, or with mention of more than one site, but not specified as generalized, multiple sites   . Cancer    breast  . GERD (gastroesophageal reflux disease)   . Hyperlipidemia   . Hypertension   . Osteopenia     Past Surgical History  Procedure Date  . Carotid endarterectomy 10/23/2004  . Total hip arthroplasty 2004    right  . Breast lumpectomy 2001    left  . Shoulder surgery 6/07    left  . Abdominal hysterectomy   . Ankle fracture surgery 4/08    left---hardware still in place    Family History  Problem Relation Age of Onset  . Hypertension Brother   . Heart disease Neg Hx   . Diabetes Neg Hx     History   Social History  . Marital Status: Married    Spouse Name: N/A    Number of Children: N/A  . Years of Education: N/A   Occupational History  . Marketing     Retired   Social History Main Topics  . Smoking status: Former Smoker -- 1.0 packs/day for 40 years    Types: Cigarettes    Quit date: 07/21/1995  . Smokeless tobacco: Never Used  . Alcohol Use: 7.0 oz/week    14 drink(s) per week  . Drug Use:   . Sexually Active:  Other Topics Concern  . Not on file   Social History Narrative   1 natural child3 adopted childrenArtist---still teaches water colorsHusband has AltzheimersHas living willDaughter Amy is health care POARequests DNR No tube feeds if cognitively unaware   Review of Systems Appetite is off--doesn't taste good But has put on weight    Objective:   Physical Exam  Constitutional: She is oriented to person, place, and time. She appears well-developed and well-nourished. No distress.  Neurological: She is alert and oriented to person, place, and time.  Psychiatric: She has a normal mood and affect. Her behavior is normal. Judgment and thought content normal.       Mood is neutral           Assessment & Plan:

## 2011-12-08 ENCOUNTER — Other Ambulatory Visit: Payer: Self-pay | Admitting: Internal Medicine

## 2011-12-15 ENCOUNTER — Other Ambulatory Visit: Payer: Self-pay | Admitting: *Deleted

## 2011-12-15 MED ORDER — LORAZEPAM 0.5 MG PO TABS: 0.2500 mg | ORAL_TABLET | Freq: Two times a day (BID) | ORAL | Status: DC | PRN

## 2011-12-15 NOTE — Telephone Encounter (Signed)
rx called into pharmacy

## 2011-12-15 NOTE — Telephone Encounter (Signed)
Okay #30 x 0 

## 2012-01-06 ENCOUNTER — Other Ambulatory Visit: Payer: Self-pay | Admitting: Internal Medicine

## 2012-02-01 ENCOUNTER — Ambulatory Visit (INDEPENDENT_AMBULATORY_CARE_PROVIDER_SITE_OTHER): Payer: Medicare Other | Admitting: Internal Medicine

## 2012-02-01 ENCOUNTER — Encounter: Payer: Self-pay | Admitting: Internal Medicine

## 2012-02-01 VITALS — BP 140/80 | HR 76 | Temp 98.5°F | Ht 65.5 in | Wt 129.0 lb

## 2012-02-01 DIAGNOSIS — I10 Essential (primary) hypertension: Secondary | ICD-10-CM

## 2012-02-01 DIAGNOSIS — E785 Hyperlipidemia, unspecified: Secondary | ICD-10-CM | POA: Diagnosis not present

## 2012-02-01 DIAGNOSIS — K219 Gastro-esophageal reflux disease without esophagitis: Secondary | ICD-10-CM | POA: Diagnosis not present

## 2012-02-01 DIAGNOSIS — F411 Generalized anxiety disorder: Secondary | ICD-10-CM | POA: Diagnosis not present

## 2012-02-01 DIAGNOSIS — F418 Other specified anxiety disorders: Secondary | ICD-10-CM

## 2012-02-01 DIAGNOSIS — M159 Polyosteoarthritis, unspecified: Secondary | ICD-10-CM | POA: Insufficient documentation

## 2012-02-01 LAB — LIPID PANEL
Cholesterol: 271 mg/dL — ABNORMAL HIGH (ref 0–200)
HDL: 77.7 mg/dL (ref >39.00)
Total CHOL/HDL Ratio: 3
Triglycerides: 131 mg/dL (ref 0.0–149.0)
VLDL: 26.2 mg/dL (ref 0.0–40.0)

## 2012-02-01 LAB — BASIC METABOLIC PANEL
BUN: 18 mg/dL (ref 6–23)
CO2: 25 mEq/L (ref 19–32)
Calcium: 9.8 mg/dL (ref 8.4–10.5)
Chloride: 105 mEq/L (ref 96–112)
Creatinine, Ser: 0.9 mg/dL (ref 0.4–1.2)
GFR: 64.43 mL/min (ref >60.00)
Glucose, Bld: 93 mg/dL (ref 70–99)
Potassium: 5.7 mEq/L — ABNORMAL HIGH (ref 3.5–5.1)
Sodium: 139 mEq/L (ref 135–145)

## 2012-02-01 LAB — HEPATIC FUNCTION PANEL
ALT: 13 U/L (ref 0–35)
AST: 20 U/L (ref 0–37)
Albumin: 4.4 g/dL (ref 3.5–5.2)
Alkaline Phosphatase: 55 U/L (ref 39–117)
Bilirubin, Direct: 0 mg/dL (ref 0.0–0.3)
Total Bilirubin: 0.6 mg/dL (ref 0.3–1.2)
Total Protein: 7.2 g/dL (ref 6.0–8.3)

## 2012-02-01 LAB — CBC WITH DIFFERENTIAL/PLATELET
Basophils Absolute: 0 10*3/uL (ref 0.0–0.1)
Basophils Relative: 0.3 % (ref 0.0–3.0)
Eosinophils Absolute: 0 10*3/uL (ref 0.0–0.7)
Eosinophils Relative: 1 % (ref 0.0–5.0)
HCT: 35.5 % — ABNORMAL LOW (ref 36.0–46.0)
Hemoglobin: 11.8 g/dL — ABNORMAL LOW (ref 12.0–15.0)
Lymphocytes Relative: 32.6 % (ref 12.0–46.0)
Lymphs Abs: 1.6 10*3/uL (ref 0.7–4.0)
MCHC: 33.4 g/dL (ref 30.0–36.0)
MCV: 108 fl — ABNORMAL HIGH (ref 78.0–100.0)
Monocytes Absolute: 0.6 10*3/uL (ref 0.1–1.0)
Monocytes Relative: 13.3 % — ABNORMAL HIGH (ref 3.0–12.0)
Neutro Abs: 2.6 10*3/uL (ref 1.4–7.7)
Neutrophils Relative %: 52.8 % (ref 43.0–77.0)
Platelets: 217 10*3/uL (ref 150.0–400.0)
RBC: 3.28 Mil/uL — ABNORMAL LOW (ref 3.87–5.11)
RDW: 14.3 % (ref 11.5–14.6)
WBC: 4.8 10*3/uL (ref 4.5–10.5)

## 2012-02-01 LAB — TSH: TSH: 0.9 u[IU]/mL (ref 0.35–5.50)

## 2012-02-01 LAB — LDL CHOLESTEROL, DIRECT: Direct LDL: 151.4 mg/dL

## 2012-02-01 MED ORDER — ESOMEPRAZOLE MAGNESIUM 40 MG PO CPDR: 40.0000 mg | DELAYED_RELEASE_CAPSULE | Freq: Every day | ORAL | Status: DC

## 2012-02-01 MED ORDER — LORAZEPAM 0.5 MG PO TABS: 0.2500 mg | ORAL_TABLET | Freq: Two times a day (BID) | ORAL | Status: DC | PRN

## 2012-02-01 NOTE — Assessment & Plan Note (Signed)
Mostly in hands Discussed decreased hand stress Tylenol prn

## 2012-02-01 NOTE — Assessment & Plan Note (Signed)
Off statins for about 3 years Not excited about going back on Will reassess

## 2012-02-01 NOTE — Assessment & Plan Note (Signed)
Quiet on the PPI 

## 2012-02-01 NOTE — Progress Notes (Signed)
Subjective:    Patient ID: [name], female    DOB: Dec 02, 1940, 71 y.o.   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 aspirin 81 MG tablet Take 81 mg by mouth daily.      . Calcium Carbonate (CALTRATE 600 PO) Take by mouth 2 (two) times daily.        . Cholecalciferol (VITAMIN D PO) Take by mouth 2 (two) times daily.       Marland Kitchen lisinopril (PRINIVIL,ZESTRIL) 10 MG tablet TAKE 1 TABLET BY MOUTH EVERY DAY  90 tablet  1  . LORazepam (ATIVAN) 0.5 MG tablet Take 0.5-1 tablets (0.25-0.5 mg total) by mouth 2 (two) times daily as needed for anxiety.  30 tablet  0  . metoprolol (LOPRESSOR) 50 MG tablet TAKE 1 TABLET TWICE A DAY  180 tablet  1  . Multiple Vitamin (MULTIVITAMIN) capsule Take 1 capsule by mouth daily.        Marland Kitchen DISCONTD: esomeprazole (NEXIUM) 40 MG capsule Take 40 mg by mouth daily before breakfast.        . DISCONTD: lisinopril (PRINIVIL,ZESTRIL) 20 MG tablet Take 10 mg by mouth daily.         Allergies  Allergen Reactions  . Wellbutrin (Bupropion) Other (See Comments)    problems sleeping and nightmares    Past Medical History  Diagnosis Date  . Osteoarthrosis involving, or with mention of more than one site, but not specified as generalized, multiple sites   . Cancer     breast  . GERD (gastroesophageal reflux disease)   . Hyperlipidemia   . Hypertension   . Osteopenia   . Osteoarthritis, multiple sites     Past Surgical  History  Procedure Date  . Carotid endarterectomy 10/23/2004  . Total hip arthroplasty 2004    right  . Breast lumpectomy 2001    left  . Shoulder surgery 6/07    left  . Abdominal hysterectomy   . Ankle fracture surgery 4/08    left---hardware still in place    Family History  Problem Relation Age of Onset  . Hypertension Brother   . Heart disease Neg Hx   . Diabetes Neg Hx     History   Social History  . Marital Status: Married    Spouse Name: N/A    Number of Children: N/A  . Years of Education: N/A   Occupational History  . Marketing     Retired   Social History Main Topics  . Smoking status: Former Smoker -- 1.0 packs/day for 40 years    Types: Cigarettes    Quit date: 07/21/1995  . Smokeless tobacco: Never Used  . Alcohol Use: 7.0 oz/week    14 drink(s) per week  . Drug Use:   . Sexually Active:    Other Topics Concern  . Not on file   Social History Narrative   1  natural child3 adopted childrenArtist---still teaches water colorsHusband has AltzheimersHas living willDaughter Amy is health care POARequests DNR No tube feeds if cognitively unaware   Review of Systems Appetite is okay Weight up a couple of pounds Sleeps fair---variable. Husband does sleep well so that is not the problem    Objective:   Physical Exam  Constitutional: She appears well-developed and well-nourished. No distress.  Neck: Normal range of motion. Neck supple. No thyromegaly present.  Cardiovascular: Normal rate, regular rhythm, normal heart sounds and intact distal pulses.  Exam reveals no gallop.   No murmur heard. Pulmonary/Chest: Effort normal and breath sounds normal. No respiratory distress. She has no wheezes. She has no rales.  Abdominal: Soft. There is no tenderness.  Musculoskeletal: She exhibits no edema and no tenderness.       Mild thickening in hand joints  Lymphadenopathy:    She has no cervical adenopathy.  Psychiatric: She has a normal mood and affect. Her  behavior is normal. Thought content normal.          Assessment & Plan:

## 2012-02-01 NOTE — Assessment & Plan Note (Signed)
BP Readings from Last 3 Encounters:  02/01/12 140/80  12/01/11 122/62  08/05/11 140/80   Reasonable control Due for labs

## 2012-02-01 NOTE — Assessment & Plan Note (Signed)
Had jolt with early death of another member of the [facility] support group Will continue the lorazepam prn

## 2012-02-15 ENCOUNTER — Ambulatory Visit: Payer: Self-pay | Admitting: Gastroenterology

## 2012-02-15 DIAGNOSIS — K769 Liver disease, unspecified: Secondary | ICD-10-CM | POA: Diagnosis not present

## 2012-02-15 DIAGNOSIS — D1800 Hemangioma unspecified site: Secondary | ICD-10-CM | POA: Diagnosis not present

## 2012-02-15 DIAGNOSIS — C50919 Malignant neoplasm of unspecified site of unspecified female breast: Secondary | ICD-10-CM | POA: Diagnosis not present

## 2012-02-15 DIAGNOSIS — K7689 Other specified diseases of liver: Secondary | ICD-10-CM | POA: Diagnosis not present

## 2012-02-19 ENCOUNTER — Other Ambulatory Visit: Payer: Self-pay | Admitting: Internal Medicine

## 2012-02-19 DIAGNOSIS — E875 Hyperkalemia: Secondary | ICD-10-CM

## 2012-02-19 DIAGNOSIS — D649 Anemia, unspecified: Secondary | ICD-10-CM

## 2012-02-19 DIAGNOSIS — D519 Vitamin B12 deficiency anemia, unspecified: Secondary | ICD-10-CM

## 2012-02-26 ENCOUNTER — Other Ambulatory Visit (INDEPENDENT_AMBULATORY_CARE_PROVIDER_SITE_OTHER): Payer: Medicare Other

## 2012-02-26 DIAGNOSIS — E875 Hyperkalemia: Secondary | ICD-10-CM

## 2012-02-26 DIAGNOSIS — D518 Other vitamin B12 deficiency anemias: Secondary | ICD-10-CM

## 2012-02-26 DIAGNOSIS — D649 Anemia, unspecified: Secondary | ICD-10-CM | POA: Diagnosis not present

## 2012-02-26 DIAGNOSIS — D519 Vitamin B12 deficiency anemia, unspecified: Secondary | ICD-10-CM

## 2012-02-26 LAB — FOLATE: Folate: >24.8 ng/mL (ref >5.9)

## 2012-02-26 LAB — POTASSIUM: Potassium: 5.3 mEq/L — ABNORMAL HIGH (ref 3.5–5.1)

## 2012-02-26 LAB — VITAMIN B12: Vitamin B-12: 330 pg/mL (ref 211–911)

## 2012-02-29 ENCOUNTER — Encounter: Payer: Self-pay | Admitting: *Deleted

## 2012-03-06 ENCOUNTER — Emergency Department: Payer: Self-pay | Admitting: Emergency Medicine

## 2012-03-06 DIAGNOSIS — I10 Essential (primary) hypertension: Secondary | ICD-10-CM | POA: Diagnosis not present

## 2012-03-06 DIAGNOSIS — R6889 Other general symptoms and signs: Secondary | ICD-10-CM | POA: Diagnosis not present

## 2012-03-06 DIAGNOSIS — Z043 Encounter for examination and observation following other accident: Secondary | ICD-10-CM | POA: Diagnosis not present

## 2012-03-06 DIAGNOSIS — T148XXA Other injury of unspecified body region, initial encounter: Secondary | ICD-10-CM | POA: Diagnosis not present

## 2012-03-06 DIAGNOSIS — S060X0A Concussion without loss of consciousness, initial encounter: Secondary | ICD-10-CM | POA: Diagnosis not present

## 2012-03-06 DIAGNOSIS — IMO0002 Reserved for concepts with insufficient information to code with codable children: Secondary | ICD-10-CM | POA: Diagnosis not present

## 2012-03-06 DIAGNOSIS — S0003XA Contusion of scalp, initial encounter: Secondary | ICD-10-CM | POA: Diagnosis not present

## 2012-03-06 DIAGNOSIS — Z79899 Other long term (current) drug therapy: Secondary | ICD-10-CM | POA: Diagnosis not present

## 2012-03-06 DIAGNOSIS — S0990XA Unspecified injury of head, initial encounter: Secondary | ICD-10-CM | POA: Diagnosis not present

## 2012-03-06 DIAGNOSIS — S0083XA Contusion of other part of head, initial encounter: Secondary | ICD-10-CM | POA: Diagnosis not present

## 2012-03-07 ENCOUNTER — Telehealth: Payer: Self-pay

## 2012-03-07 MED ORDER — TRAMADOL HCL 50 MG PO TABS: 25.0000 mg | ORAL_TABLET | Freq: Three times a day (TID) | ORAL | Status: DC | PRN

## 2012-03-07 NOTE — Telephone Encounter (Signed)
Pt fell 03/05/12 while walking her dog, fell off curb; seen [facility] ER (records have been requested). Head, shoulders,hands and knees injured,both eyes are black and swollen but vision is not affected. Pt states CT of head was OK and no broken bones or sutures were needed. Pt has generalized bruising. Pt aching all over and Tylenol is not helping. Pt request med for aching. Pt did not want to make appt. [facility]. Pt request call back.Please advise.

## 2012-03-07 NOTE — Telephone Encounter (Signed)
rx sent to pharmacy by e-script Spoke with patient and advised results   

## 2012-03-07 NOTE — Telephone Encounter (Signed)
I did review the records  Okay to Rx tramadol 50mg  1/2-1 tid prn pain #40 x 0 If ongoing pain, will need reevaluation

## 2012-03-22 ENCOUNTER — Ambulatory Visit (INDEPENDENT_AMBULATORY_CARE_PROVIDER_SITE_OTHER): Payer: Medicare Other | Admitting: Internal Medicine

## 2012-03-22 ENCOUNTER — Encounter: Payer: Self-pay | Admitting: Internal Medicine

## 2012-03-22 VITALS — BP 138/70 | HR 63 | Temp 98.1°F | Ht 65.5 in | Wt 126.0 lb

## 2012-03-22 DIAGNOSIS — S1093XA Contusion of unspecified part of neck, initial encounter: Secondary | ICD-10-CM | POA: Diagnosis not present

## 2012-03-22 DIAGNOSIS — S0003XA Contusion of scalp, initial encounter: Secondary | ICD-10-CM | POA: Diagnosis not present

## 2012-03-22 DIAGNOSIS — IMO0002 Reserved for concepts with insufficient information to code with codable children: Secondary | ICD-10-CM

## 2012-03-22 DIAGNOSIS — S0083XA Contusion of other part of head, initial encounter: Secondary | ICD-10-CM | POA: Diagnosis not present

## 2012-03-22 DIAGNOSIS — S0093XA Contusion of unspecified part of head, initial encounter: Secondary | ICD-10-CM

## 2012-03-22 MED ORDER — HYDROCODONE-ACETAMINOPHEN 5-325 MG PO TABS: 1.0000 | ORAL_TABLET | Freq: Four times a day (QID) | ORAL | Status: AC | PRN

## 2012-03-22 NOTE — Patient Instructions (Signed)
Don't take the lorazepam with the hydrocodone

## 2012-03-22 NOTE — Assessment & Plan Note (Signed)
Mostly hip adductors and abductors Considerable pain and disability more than 2 weeks after fall Will try hydrocodone PT referral

## 2012-03-22 NOTE — Progress Notes (Signed)
Subjective:    Patient ID: [name], female    DOB: [birth year], 71 y.o.   MRN: [id number]  HPI Here with husband Larey Seat 2.5 weeks ago Still with large hematoma on left forehead Bad pain in upper thighs and buttocks Left hand is painful  Slipped in grass walking dog and hit head on street Doesn't remember exactly what happened Thinks she blacked out Had to crawl back to her front door---then got in and called 911 (may have taken her 1 hour) ER records reviewed  CT scan negative  Using tylenol for pain---may be helping somewhat Tramadol no help though  Hard time even walking--pain in legs  Occ aching or "zingers" at left forehead  Memory and thinking are fine No amnesia for event and no cognitive changes  Current Outpatient Prescriptions on File Prior to Visit  Medication Sig Dispense Refill  . aspirin 81 MG tablet Take 81 mg by mouth daily.      . Calcium Carbonate (CALTRATE 600 PO) Take by mouth 2 (two) times daily.        . Cholecalciferol (VITAMIN D PO) Take by mouth 2 (two) times daily.       Marland Kitchen esomeprazole (NEXIUM) 40 MG capsule Take 1 capsule (40 mg total) by mouth daily before breakfast.  90 capsule  3  . lisinopril (PRINIVIL,ZESTRIL) 10 MG tablet TAKE 1 TABLET BY MOUTH EVERY DAY  90 tablet  1  . LORazepam (ATIVAN) 0.5 MG tablet Take 0.5-1 tablets (0.25-0.5 mg total) by mouth 2 (two) times daily as needed for anxiety.  60 tablet  0  . metoprolol (LOPRESSOR) 50 MG tablet TAKE 1 TABLET TWICE A DAY  180 tablet  1  . Multiple Vitamin (MULTIVITAMIN) capsule Take 1 capsule by mouth daily.        . traMADol (ULTRAM) 50 MG tablet Take 0.5-1 tablets (25-50 mg total) by mouth 3 (three) times daily as needed.  40 tablet  0    Allergies  Allergen Reactions  . Wellbutrin (Bupropion) Other (See Comments)    problems sleeping and nightmares    Past Medical History  Diagnosis Date  . Osteoarthrosis involving, or with mention of more than one site, but not specified as  generalized, multiple sites   . Cancer     breast  . GERD (gastroesophageal reflux disease)   . Hyperlipidemia   . Hypertension   . Osteopenia   . Osteoarthritis, multiple sites     Past Surgical History  Procedure Date  . Carotid endarterectomy 10/23/2004  . Total hip arthroplasty 2004    right  . Breast lumpectomy 2001    left  . Shoulder surgery 6/07    left  . Abdominal hysterectomy   . Ankle fracture surgery 4/08    left---hardware still in place    Family History  Problem Relation Age of Onset  . Hypertension Brother   . Heart disease Neg Hx   . Diabetes Neg Hx     History   Social History  . Marital Status: Married    Spouse Name: N/A    Number of Children: N/A  . Years of Education: N/A   Occupational History  . Marketing     Retired   Social History Main Topics  . Smoking status: Former Smoker -- 1.0 packs/day for 40 years    Types: Cigarettes    Quit date: 07/21/1995  . Smokeless tobacco: Never Used  . Alcohol Use: 7.0 oz/week    14 drink(s) per week  .  Drug Use:   . Sexually Active:    Other Topics Concern  . Not on file   Social History Narrative   1 natural child3 adopted childrenArtist---still teaches water colorsHusband has AltzheimersHas living willDaughter Amy is health care POARequests DNR No tube feeds if cognitively unaware   Review of Systems Able to sleep Appetite never good but not really changed No nausea or vomiting No headache other than over the hematoma    Objective:   Physical Exam  Constitutional: She appears well-developed and well-nourished.  HENT:       Moderate hematoma at left temple brusing down left face but no bony tenderness  Musculoskeletal:       ROM of hips is normal without pain Pain recreated with resisted abduction and adduction at both hips  Neurological: She exhibits normal muscle tone.       Gait is stiff and wide based but no ataxia Normal hand strength          Assessment & Plan:

## 2012-03-22 NOTE — Assessment & Plan Note (Signed)
Mild tenderness at site but no evidence of traumatic brain injury No further intervention except analgesia

## 2012-04-27 ENCOUNTER — Ambulatory Visit: Payer: Self-pay | Admitting: Oncology

## 2012-04-27 DIAGNOSIS — Z7982 Long term (current) use of aspirin: Secondary | ICD-10-CM | POA: Diagnosis not present

## 2012-04-27 DIAGNOSIS — Z96649 Presence of unspecified artificial hip joint: Secondary | ICD-10-CM | POA: Diagnosis not present

## 2012-04-27 DIAGNOSIS — Z79899 Other long term (current) drug therapy: Secondary | ICD-10-CM | POA: Diagnosis not present

## 2012-04-27 DIAGNOSIS — Z9089 Acquired absence of other organs: Secondary | ICD-10-CM | POA: Diagnosis not present

## 2012-04-27 DIAGNOSIS — I10 Essential (primary) hypertension: Secondary | ICD-10-CM | POA: Diagnosis not present

## 2012-04-27 DIAGNOSIS — Z90710 Acquired absence of both cervix and uterus: Secondary | ICD-10-CM | POA: Diagnosis not present

## 2012-04-27 DIAGNOSIS — D7589 Other specified diseases of blood and blood-forming organs: Secondary | ICD-10-CM | POA: Diagnosis not present

## 2012-04-27 DIAGNOSIS — M129 Arthropathy, unspecified: Secondary | ICD-10-CM | POA: Diagnosis not present

## 2012-04-27 DIAGNOSIS — D472 Monoclonal gammopathy: Secondary | ICD-10-CM | POA: Diagnosis not present

## 2012-04-27 DIAGNOSIS — L408 Other psoriasis: Secondary | ICD-10-CM | POA: Diagnosis not present

## 2012-04-27 DIAGNOSIS — K2270 Barrett's esophagus without dysplasia: Secondary | ICD-10-CM | POA: Diagnosis not present

## 2012-04-27 DIAGNOSIS — Z9889 Other specified postprocedural states: Secondary | ICD-10-CM | POA: Diagnosis not present

## 2012-04-27 DIAGNOSIS — Z9010 Acquired absence of unspecified breast and nipple: Secondary | ICD-10-CM | POA: Diagnosis not present

## 2012-04-27 DIAGNOSIS — Z853 Personal history of malignant neoplasm of breast: Secondary | ICD-10-CM | POA: Diagnosis not present

## 2012-04-28 DIAGNOSIS — I10 Essential (primary) hypertension: Secondary | ICD-10-CM | POA: Diagnosis not present

## 2012-04-28 DIAGNOSIS — L408 Other psoriasis: Secondary | ICD-10-CM | POA: Diagnosis not present

## 2012-04-28 DIAGNOSIS — D472 Monoclonal gammopathy: Secondary | ICD-10-CM | POA: Diagnosis not present

## 2012-04-28 DIAGNOSIS — M129 Arthropathy, unspecified: Secondary | ICD-10-CM | POA: Diagnosis not present

## 2012-04-28 DIAGNOSIS — D7589 Other specified diseases of blood and blood-forming organs: Secondary | ICD-10-CM | POA: Diagnosis not present

## 2012-04-28 DIAGNOSIS — Z853 Personal history of malignant neoplasm of breast: Secondary | ICD-10-CM | POA: Diagnosis not present

## 2012-04-28 LAB — CBC CANCER CENTER
Basophil #: 0 x10 3/mm (ref 0.0–0.1)
Basophil %: 0.7 %
Eosinophil #: 0.1 x10 3/mm (ref 0.0–0.7)
Eosinophil %: 1.2 %
HCT: 36.9 % (ref 35.0–47.0)
HGB: 12 g/dL (ref 12.0–16.0)
Lymphocyte #: 1.6 x10 3/mm (ref 1.0–3.6)
Lymphocyte %: 37 %
MCH: 36.5 pg — ABNORMAL HIGH (ref 26.0–34.0)
MCHC: 32.6 g/dL (ref 32.0–36.0)
MCV: 112 fL — ABNORMAL HIGH (ref 80–100)
Monocyte #: 0.6 x10 3/mm (ref 0.2–0.9)
Monocyte %: 14 %
Neutrophil #: 2.1 x10 3/mm (ref 1.4–6.5)
Neutrophil %: 47.1 %
Platelet: 282 x10 3/mm (ref 150–440)
RBC: 3.29 10*6/uL — ABNORMAL LOW (ref 3.80–5.20)
RDW: 13.1 % (ref 11.5–14.5)
WBC: 4.4 x10 3/mm (ref 3.6–11.0)

## 2012-04-28 LAB — BASIC METABOLIC PANEL
Anion Gap: 13 (ref 7–16)
BUN: 18 mg/dL (ref 7–18)
Calcium, Total: 9.5 mg/dL (ref 8.5–10.1)
Chloride: 102 mmol/L (ref 98–107)
Co2: 24 mmol/L (ref 21–32)
Creatinine: 0.97 mg/dL (ref 0.60–1.30)
EGFR (African American): >60
EGFR (Non-African Amer.): 58 — ABNORMAL LOW
Glucose: 95 mg/dL (ref 65–99)
Osmolality: 279 (ref 275–301)
Potassium: 5 mmol/L (ref 3.5–5.1)
Sodium: 139 mmol/L (ref 136–145)

## 2012-05-02 LAB — KAPPA/LAMBDA FREE LIGHT CHAINS (ARMC)

## 2012-05-02 LAB — PROT IMMUNOELECTROPHORES(ARMC)

## 2012-05-09 DIAGNOSIS — D472 Monoclonal gammopathy: Secondary | ICD-10-CM | POA: Diagnosis not present

## 2012-05-09 DIAGNOSIS — D7589 Other specified diseases of blood and blood-forming organs: Secondary | ICD-10-CM | POA: Diagnosis not present

## 2012-05-09 DIAGNOSIS — L408 Other psoriasis: Secondary | ICD-10-CM | POA: Diagnosis not present

## 2012-05-09 DIAGNOSIS — I10 Essential (primary) hypertension: Secondary | ICD-10-CM | POA: Diagnosis not present

## 2012-05-09 DIAGNOSIS — M129 Arthropathy, unspecified: Secondary | ICD-10-CM | POA: Diagnosis not present

## 2012-05-09 DIAGNOSIS — Z853 Personal history of malignant neoplasm of breast: Secondary | ICD-10-CM | POA: Diagnosis not present

## 2012-05-12 DIAGNOSIS — Z23 Encounter for immunization: Secondary | ICD-10-CM | POA: Diagnosis not present

## 2012-05-20 ENCOUNTER — Ambulatory Visit: Payer: Self-pay | Admitting: Oncology

## 2012-06-18 ENCOUNTER — Other Ambulatory Visit: Payer: Self-pay | Admitting: Internal Medicine

## 2012-07-06 DIAGNOSIS — L408 Other psoriasis: Secondary | ICD-10-CM | POA: Diagnosis not present

## 2012-07-16 ENCOUNTER — Other Ambulatory Visit: Payer: Self-pay | Admitting: Internal Medicine

## 2012-08-05 ENCOUNTER — Ambulatory Visit (INDEPENDENT_AMBULATORY_CARE_PROVIDER_SITE_OTHER)
Admission: RE | Admit: 2012-08-05 | Discharge: 2012-08-05 | Disposition: A | Discharge status: Home / Self Care | Payer: Medicare Other | Source: Ambulatory Visit | Attending: Internal Medicine | Admitting: Internal Medicine

## 2012-08-05 ENCOUNTER — Encounter: Payer: Self-pay | Admitting: Internal Medicine

## 2012-08-05 ENCOUNTER — Ambulatory Visit (INDEPENDENT_AMBULATORY_CARE_PROVIDER_SITE_OTHER): Payer: Medicare Other | Admitting: Internal Medicine

## 2012-08-05 VITALS — BP 100/80 | HR 64 | Temp 98.0°F | Wt 130.0 lb

## 2012-08-05 DIAGNOSIS — M79643 Pain in unspecified hand: Secondary | ICD-10-CM

## 2012-08-05 DIAGNOSIS — M79609 Pain in unspecified limb: Secondary | ICD-10-CM | POA: Diagnosis not present

## 2012-08-05 DIAGNOSIS — Z1331 Encounter for screening for depression: Secondary | ICD-10-CM | POA: Diagnosis not present

## 2012-08-05 DIAGNOSIS — M79641 Pain in right hand: Secondary | ICD-10-CM | POA: Insufficient documentation

## 2012-08-05 DIAGNOSIS — I10 Essential (primary) hypertension: Secondary | ICD-10-CM | POA: Diagnosis not present

## 2012-08-05 DIAGNOSIS — L409 Psoriasis, unspecified: Secondary | ICD-10-CM | POA: Insufficient documentation

## 2012-08-05 DIAGNOSIS — K219 Gastro-esophageal reflux disease without esophagitis: Secondary | ICD-10-CM | POA: Diagnosis not present

## 2012-08-05 DIAGNOSIS — F418 Other specified anxiety disorders: Secondary | ICD-10-CM

## 2012-08-05 DIAGNOSIS — M19049 Primary osteoarthritis, unspecified hand: Secondary | ICD-10-CM | POA: Diagnosis not present

## 2012-08-05 DIAGNOSIS — F411 Generalized anxiety disorder: Secondary | ICD-10-CM | POA: Diagnosis not present

## 2012-08-05 MED ORDER — LORAZEPAM 0.5 MG PO TABS: 0.2500 mg | ORAL_TABLET | Freq: Two times a day (BID) | ORAL | Status: DC | PRN

## 2012-08-05 NOTE — Assessment & Plan Note (Signed)
BP Readings from Last 3 Encounters:  08/05/12 100/80  03/22/12 138/70  02/01/12 140/80   Good control No changes for now

## 2012-08-05 NOTE — Progress Notes (Signed)
Subjective:    Patient ID: [name], female    DOB: 11-03-1940, 72 y.o.   MRN: [id number]  HPI Here with husband Ongoing stress with his care He goes to [facility] only 2 days per week---discussed increasing Fortunately he doesn't need help with personal care Does use the lorazepam at times---"takes the edge off" At times it is hard to initiate sleep--it helps then  Mostly healed from the head injury with the fall  Tries to exercise--some limitations with husband supervision Goes to silver slippers twice a week No chest pain No SOB or change in stamina No dizziness or syncope  Stomach has been fine Wonders if she still needs the nexium Has cut down to about 1/2 dose No heartburn  Still having a terrible time with her hands Uses tylenol up to bid [facility] and 2nd and 3rd PIP bilaterally  Saw Dr Gwen Pounds for flare of her psoriasis May have psoriatic arthritis He has recommended enbrel Had bad time with MTX in past so she is reluctant  Current Outpatient Prescriptions on File Prior to Visit  Medication Sig Dispense Refill  . aspirin 81 MG tablet Take 81 mg by mouth daily.      . Calcium Carbonate (CALTRATE 600 PO) Take by mouth 2 (two) times daily.        . Cholecalciferol (VITAMIN D PO) Take by mouth 2 (two) times daily.       Marland Kitchen esomeprazole (NEXIUM) 40 MG capsule Take 1 capsule (40 mg total) by mouth daily before breakfast.  90 capsule  3  . lisinopril (PRINIVIL,ZESTRIL) 10 MG tablet TAKE 1 TABLET BY MOUTH EVERY DAY  90 tablet  1  . LORazepam (ATIVAN) 0.5 MG tablet Take 0.5-1 tablets (0.25-0.5 mg total) by mouth 2 (two) times daily as needed for anxiety.  60 tablet  0  . metoprolol (LOPRESSOR) 50 MG tablet TAKE 1 TABLET TWICE A DAY  180 tablet  1  . Multiple Vitamin (MULTIVITAMIN) capsule Take 1 capsule by mouth daily.          Allergies  Allergen Reactions  . Wellbutrin (Bupropion) Other (See Comments)    problems sleeping and nightmares    Past Medical History    Diagnosis Date  . Osteoarthrosis involving, or with mention of more than one site, but not specified as generalized, multiple sites   . Cancer     breast  . GERD (gastroesophageal reflux disease)   . Hyperlipidemia   . Hypertension   . Osteopenia   . Osteoarthritis, multiple sites     Past Surgical History  Procedure Date  . Carotid endarterectomy 10/23/2004  . Total hip arthroplasty 2004    right  . Breast lumpectomy 2001    left  . Shoulder surgery 6/07    left  . Abdominal hysterectomy   . Ankle fracture surgery 4/08    left---hardware still in place    Family History  Problem Relation Age of Onset  . Hypertension Brother   . Heart disease Neg Hx   . Diabetes Neg Hx     History   Social History  . Marital Status: Married    Spouse Name: N/A    Number of Children: N/A  . Years of Education: N/A   Occupational History  . Marketing     Retired   Social History Main Topics  . Smoking status: Former Smoker -- 1.0 packs/day for 40 years    Types: Cigarettes    Quit date: 07/21/1995  . Smokeless tobacco:  Never Used  . Alcohol Use: 7.0 oz/week    14 drink(s) per week  . Drug Use:   . Sexually Active:    Other Topics Concern  . Not on file   Social History Narrative   1 natural child3 adopted childrenArtist---still teaches water colorsHusband has AltzheimersHas living willDaughter Amy is health care POARequests DNR No tube feeds if cognitively unaware   Review of Systems Generally sleeps okay---gets cycles of having more trouble Appetite is okay Weight up slightly---desired     Objective:   Physical Exam  Constitutional: She appears well-developed and well-nourished. No distress.  Neck: Normal range of motion. Neck supple. No thyromegaly present.  Cardiovascular: Normal rate, regular rhythm, normal heart sounds and intact distal pulses.  Exam reveals no gallop.   No murmur heard. Pulmonary/Chest: Effort normal and breath sounds normal. No respiratory  distress. She has no wheezes. She has no rales.  Musculoskeletal: She exhibits no edema and no tenderness.       Swelling in both 2nd and 3rd PIPs  Lymphadenopathy:    She has no cervical adenopathy.  Psychiatric: She has a normal mood and affect. Her behavior is normal.          Assessment & Plan:

## 2012-08-05 NOTE — Assessment & Plan Note (Signed)
Doing well while weaning PPI She will try to wean to stop

## 2012-08-05 NOTE — Assessment & Plan Note (Signed)
Pattern could be consistent with psoriatic arthritis i will check x-rays to see Hold off on enbrel for now---try regular tylenol If worsens, could consider

## 2012-08-05 NOTE — Assessment & Plan Note (Signed)
Ongoing stress Discussed respite Still has caregiver support group occ lorazepam

## 2012-09-30 DIAGNOSIS — H353190 Nonexudative age-related macular degeneration, unspecified eye, stage unspecified: Secondary | ICD-10-CM | POA: Diagnosis not present

## 2012-10-01 ENCOUNTER — Other Ambulatory Visit: Payer: Self-pay | Admitting: Internal Medicine

## 2012-10-01 NOTE — Telephone Encounter (Signed)
Okay #30 x 0 

## 2012-10-03 NOTE — Telephone Encounter (Signed)
rx called into pharmacy

## 2012-10-18 ENCOUNTER — Encounter: Payer: Self-pay | Admitting: Vascular Surgery

## 2012-10-19 ENCOUNTER — Encounter: Payer: Self-pay | Admitting: Vascular Surgery

## 2012-10-19 ENCOUNTER — Ambulatory Visit (INDEPENDENT_AMBULATORY_CARE_PROVIDER_SITE_OTHER): Payer: Medicare Other | Admitting: Vascular Surgery

## 2012-10-19 ENCOUNTER — Ambulatory Visit: Payer: Medicare Other | Admitting: Vascular Surgery

## 2012-10-19 ENCOUNTER — Other Ambulatory Visit: Payer: Self-pay | Admitting: *Deleted

## 2012-10-19 ENCOUNTER — Other Ambulatory Visit: Payer: Medicare Other

## 2012-10-19 ENCOUNTER — Other Ambulatory Visit (INDEPENDENT_AMBULATORY_CARE_PROVIDER_SITE_OTHER): Payer: Medicare Other | Admitting: *Deleted

## 2012-10-19 DIAGNOSIS — Z48812 Encounter for surgical aftercare following surgery on the circulatory system: Secondary | ICD-10-CM

## 2012-10-19 DIAGNOSIS — I6529 Occlusion and stenosis of unspecified carotid artery: Secondary | ICD-10-CM

## 2012-10-19 NOTE — Assessment & Plan Note (Signed)
Carotid duplex shows a widely patent left carotid endarterectomy site without evidence of stenosis on the right. I think we can continue duplex scans at 18 month intervals. I will only need to see her if there is any change in her duplex. We have reviewed the symptoms of ureteral vascular disease she knows to call she develops any new symptoms. She does know to continue taking her aspirin.

## 2012-10-19 NOTE — Progress Notes (Signed)
[facility]  Patient name: [name] MRN: [id number] DOB: 1941/01/01 Sex: female  REASON FOR VISIT: follow up of carotid disease.  HPI: [name] is a 72 y.o. female who underwent a left carotid endarterectomy in April of 2006. She comes in for an 18 month durable duplex. She denies any history of stroke, TIAs, expressive or receptive aphasia, or amaurosis fugax. She continues to teach water coloring. She has a history of hypertension and hyperlipidemia which had been well controlled on her current medications.  Past Medical History  Diagnosis Date  . Osteoarthrosis involving, or with mention of more than one site, but not specified as generalized, multiple sites   . Cancer     breast  . GERD (gastroesophageal reflux disease)   . Hyperlipidemia   . Hypertension   . Osteopenia   . Osteoarthritis, multiple sites   . Psoriasis     Family History  Problem Relation Age of Onset  . Hypertension Brother   . Heart disease Neg Hx   . Diabetes Neg Hx   . Pneumonia Mother   . Leukemia Father     SOCIAL HISTORY: History  Substance Use Topics  . Smoking status: Former Smoker -- 1.00 packs/day for 40 years    Types: Cigarettes    Quit date: 07/21/1995  . Smokeless tobacco: Never Used  . Alcohol Use: 7.0 oz/week    14 drink(s) per week    Allergies  Allergen Reactions  . Ibuprofen Nausea Only  . Wellbutrin (Bupropion) Other (See Comments)    problems sleeping and nightmares    Current Outpatient Prescriptions  Medication Sig Dispense Refill  . aspirin 81 MG tablet Take 81 mg by mouth daily.      . Cholecalciferol (VITAMIN D PO) Take by mouth 2 (two) times daily.       Marland Kitchen lisinopril (PRINIVIL,ZESTRIL) 10 MG tablet TAKE 1 TABLET BY MOUTH EVERY DAY  90 tablet  1  . metoprolol (LOPRESSOR) 50 MG tablet TAKE 1 TABLET TWICE A DAY  180 tablet  1  . Multiple Vitamin (MULTIVITAMIN) capsule Take 1 capsule by mouth daily.        . Calcium  Carbonate (CALTRATE 600 PO) Take by mouth 2 (two) times daily.        Marland Kitchen esomeprazole (NEXIUM) 40 MG capsule Take 1 capsule (40 mg total) by mouth daily before breakfast.  90 capsule  3  . HYDROcodone-acetaminophen (NORCO/VICODIN) 5-325 MG per tablet TAKE 1 TABLET EVERY 6 HOURS AS NEEDED FOR PAIN  30 tablet  0  . LORazepam (ATIVAN) 0.5 MG tablet Take 0.5-1 tablets (0.25-0.5 mg total) by mouth 2 (two) times daily as needed for anxiety.  60 tablet  0   No current facility-administered medications for this visit.    REVIEW OF SYSTEMS: Arly.Keller ] denotes positive finding; [  ] denotes negative finding  CARDIOVASCULAR:  [ ]  chest pain   [ ]  chest pressure   [ ]  palpitations   [ ]  orthopnea   [ ]  dyspnea on exertion   [ ]  claudication   [ ]  rest pain   [ ]  DVT   [ ]  phlebitis PULMONARY:   [ ]  productive cough   [ ]  asthma   [ ]  wheezing NEUROLOGIC:   [ ]  weakness  [ ]  paresthesias  [ ]  aphasia  [ ]  amaurosis  [ ]  dizziness HEMATOLOGIC:   [ ]  bleeding problems   [ ]  clotting disorders MUSCULOSKELETAL:  [ ]   joint pain   [ ]  joint swelling [ ]  leg swelling GASTROINTESTINAL: [ ]   blood in stool  [ ]   hematemesis GENITOURINARY:  [ ]   dysuria  [ ]   hematuria PSYCHIATRIC:  [ ]  history of major depression INTEGUMENTARY:  [ ]  rashes  [ ]  ulcers CONSTITUTIONAL:  [ ]  fever   [ ]  chills  PHYSICAL EXAM: Filed Vitals:   10/19/12 1056 10/19/12 1102  BP: 154/78 126/74  Pulse: 50 50  Temp: 97.4 F (36.3 C)   TempSrc: Oral   Resp: 16   Height: 5' 6.5" (1.689 m)   Weight: 132 lb 4.8 oz (60.011 kg)    Body mass index is 21.04 kg/(m^2). GENERAL: The patient is a well-nourished female, in no acute distress. The vital signs are documented above. CARDIOVASCULAR: There is a regular rate and rhythm. I do not detect carotid bruits. PULMONARY: There is good air exchange bilaterally without wheezing or rales. ABDOMEN: Soft and non-tender with normal pitched bowel sounds.  MUSCULOSKELETAL: There are no major deformities  or cyanosis. NEUROLOGIC: No focal weakness or paresthesias are detected. SKIN: There are no ulcers or rashes noted. PSYCHIATRIC: The patient has a normal affect.  DATA:  I have independently interpreted her carotid duplex in which shows a less than 40% right internal carotid artery stenosis. She has a widely patent left carotid endarterectomy site. Is not pressure discrepancy with evidence of left subclavian artery stenosis however she is asymptomatic from this standpoint.  MEDICAL ISSUES:  Occlusion and stenosis of carotid artery without mention of cerebral infarction Carotid duplex shows a widely patent left carotid endarterectomy site without evidence of stenosis on the right. I think we can continue duplex scans at 18 month intervals. I will only need to see her if there is any change in her duplex. We have reviewed the symptoms of ureteral vascular disease she knows to call she develops any new symptoms. She does know to continue taking her aspirin.   DICKSON,CHRISTOPHER S [facility] [place] [facility]: [phone]

## 2012-12-14 ENCOUNTER — Other Ambulatory Visit: Payer: Self-pay | Admitting: Internal Medicine

## 2012-12-14 NOTE — Telephone Encounter (Signed)
rx called into pharmacy

## 2012-12-14 NOTE — Telephone Encounter (Signed)
Okay #60 x 0 

## 2012-12-17 ENCOUNTER — Other Ambulatory Visit: Payer: Self-pay | Admitting: Internal Medicine

## 2012-12-29 ENCOUNTER — Telehealth: Payer: Self-pay

## 2012-12-29 DIAGNOSIS — Z853 Personal history of malignant neoplasm of breast: Secondary | ICD-10-CM

## 2012-12-29 NOTE — Telephone Encounter (Signed)
Pt request order for diagnostic mammogram to [facility]. Pt's last mammo 11/2011.  Bilateral diag mammo order in pts chart expired 12/27/12. Pt will wait to hear from pt care coordinator about appt.

## 2013-01-02 ENCOUNTER — Encounter: Payer: Self-pay | Admitting: Internal Medicine

## 2013-01-03 NOTE — Telephone Encounter (Signed)
Appt made for patient at [facility] and patient notified.

## 2013-01-07 ENCOUNTER — Other Ambulatory Visit: Payer: Self-pay | Admitting: Internal Medicine

## 2013-01-24 ENCOUNTER — Ambulatory Visit: Payer: Self-pay | Admitting: Internal Medicine

## 2013-01-24 DIAGNOSIS — Z853 Personal history of malignant neoplasm of breast: Secondary | ICD-10-CM | POA: Diagnosis not present

## 2013-01-24 DIAGNOSIS — R928 Other abnormal and inconclusive findings on diagnostic imaging of breast: Secondary | ICD-10-CM | POA: Diagnosis not present

## 2013-01-25 ENCOUNTER — Encounter: Payer: Self-pay | Admitting: Internal Medicine

## 2013-01-25 ENCOUNTER — Encounter: Payer: Self-pay | Admitting: *Deleted

## 2013-02-02 ENCOUNTER — Encounter: Payer: Self-pay | Admitting: Radiology

## 2013-02-03 ENCOUNTER — Ambulatory Visit (INDEPENDENT_AMBULATORY_CARE_PROVIDER_SITE_OTHER): Payer: Medicare Other | Admitting: Internal Medicine

## 2013-02-03 ENCOUNTER — Encounter: Payer: Self-pay | Admitting: Internal Medicine

## 2013-02-03 VITALS — BP 100/60 | HR 64 | Temp 98.3°F | Wt 126.0 lb

## 2013-02-03 DIAGNOSIS — L4050 Arthropathic psoriasis, unspecified: Secondary | ICD-10-CM | POA: Diagnosis not present

## 2013-02-03 DIAGNOSIS — I10 Essential (primary) hypertension: Secondary | ICD-10-CM

## 2013-02-03 DIAGNOSIS — K219 Gastro-esophageal reflux disease without esophagitis: Secondary | ICD-10-CM | POA: Diagnosis not present

## 2013-02-03 MED ORDER — METHOTREXATE 2.5 MG PO TABS: 10.0000 mg | ORAL_TABLET | ORAL | Status: DC

## 2013-02-03 MED ORDER — FOLIC ACID 1 MG PO TABS: 1.0000 mg | ORAL_TABLET | Freq: Every day | ORAL | Status: DC

## 2013-02-03 NOTE — Progress Notes (Signed)
Subjective:    Patient ID: [name], female    DOB: Oct 15, 1940, 72 y.o.   MRN: [id number]  HPI Here with husband  "I am not myself.... Everything is not like usual" Always a positive person and is overwhelmed Some family issues but mostly caring for husband with dementia Doesn't want to entertain now---always enjoyed this Just doesn't have energy Has used the lorazepam rarely to help sleep (keeps her from awakening with nightmares etc)  Ongoing hand pain Tylenol no help Using ibuprofen ~3 times per week with slight help No stomach problems with this  Still not ready for more aggressive Rx  Stomach fine Off the nexium without problems  No chest pain No SOB Still exercises at Y---but limited by supervision for husband  Current Outpatient Prescriptions on File Prior to Visit  Medication Sig Dispense Refill  . aspirin 81 MG tablet Take 81 mg by mouth daily.      . Cholecalciferol (VITAMIN D PO) Take by mouth 2 (two) times daily.       Marland Kitchen lisinopril (PRINIVIL,ZESTRIL) 10 MG tablet TAKE 1 TABLET BY MOUTH EVERY DAY  90 tablet  1  . LORazepam (ATIVAN) 0.5 MG tablet TAKE 1/2 - 1 TABLET TWICE A DAY AS NEEDED FOR ANXIETY  60 tablet  0  . metoprolol (LOPRESSOR) 50 MG tablet TAKE 1 TABLET TWICE A DAY  180 tablet  1  . Multiple Vitamin (MULTIVITAMIN) capsule Take 1 capsule by mouth daily.         No current facility-administered medications on file prior to visit.    Allergies  Allergen Reactions  . Ibuprofen Nausea Only  . Wellbutrin (Bupropion) Other (See Comments)    problems sleeping and nightmares    Past Medical History  Diagnosis Date  . Osteoarthrosis involving, or with mention of more than one site, but not specified as generalized, multiple sites   . Cancer     breast  . GERD (gastroesophageal reflux disease)   . Hyperlipidemia   . Hypertension   . Osteopenia   . Osteoarthritis, multiple sites   . Psoriasis     Past Surgical History  Procedure Laterality  Date  . Total hip arthroplasty  2004    right  . Breast lumpectomy  2001    left  . Shoulder surgery  6/07    left  . Abdominal hysterectomy    . Ankle fracture surgery  4/08    left---hardware still in place  . Fracture surgery    . Carotid endarterectomy Left 10/23/2004    Family History  Problem Relation Age of Onset  . Hypertension Brother   . Heart disease Neg Hx   . Diabetes Neg Hx   . Pneumonia Mother   . Leukemia Father     History   Social History  . Marital Status: Married    Spouse Name: N/A    Number of Children: N/A  . Years of Education: N/A   Occupational History  . Marketing     Retired   Social History Main Topics  . Smoking status: Former Smoker -- 1.00 packs/day for 40 years    Types: Cigarettes    Quit date: 07/21/1995  . Smokeless tobacco: Never Used  . Alcohol Use: 7.0 oz/week    14 drink(s) per week  . Drug Use: No  . Sexually Active: Not on file   Other Topics Concern  . Not on file   Social History Narrative   1 natural child   3  adopted children   Artist---still teaches water colors   Husband has Altzheimers   Has living will   Daughter Amy is health care POA   Requests DNR    No tube feeds if cognitively unaware   Review of Systems Sleeps poorly in general Appetite is fine Weight is up a few pounds     Objective:   Physical Exam  Constitutional: She appears well-developed and well-nourished. No distress.  Neck: Normal range of motion. Neck supple.  Cardiovascular: Normal rate, regular rhythm and normal heart sounds.  Exam reveals no gallop.   No murmur heard. Pulmonary/Chest: Effort normal and breath sounds normal. No respiratory distress. She has no wheezes. She has no rales.  Musculoskeletal: She exhibits no edema and no tenderness.  Marked synovitis in all hand PIPs  Lymphadenopathy:    She has no cervical adenopathy.  Psychiatric: She has a normal mood and affect. Her behavior is normal.          Assessment  & Plan:

## 2013-02-03 NOTE — Assessment & Plan Note (Signed)
Quiet even off the med

## 2013-02-03 NOTE — Patient Instructions (Signed)
Please call [facility] or get a home care aide through memory care. I recommend getting help at least 2-3 days a week, other than when he goes to [employer] [employer]

## 2013-02-03 NOTE — Assessment & Plan Note (Signed)
Ongoing synovitis Discussed her past experience---the drug she took wasn't just once a week, so not MTX Will start low dose MTX-- 10mg  weekly See back in 3 months Folic acid also

## 2013-02-03 NOTE — Assessment & Plan Note (Signed)
BP Readings from Last 3 Encounters:  02/03/13 100/60  10/19/12 126/74  08/05/12 100/80   Good control No changes needed

## 2013-03-18 ENCOUNTER — Other Ambulatory Visit: Payer: Self-pay | Admitting: Internal Medicine

## 2013-03-21 NOTE — Telephone Encounter (Signed)
Okay #60 x 0 

## 2013-03-21 NOTE — Telephone Encounter (Signed)
rx called into pharmacy

## 2013-05-01 ENCOUNTER — Ambulatory Visit: Payer: Self-pay | Admitting: Oncology

## 2013-05-01 DIAGNOSIS — Z79899 Other long term (current) drug therapy: Secondary | ICD-10-CM | POA: Diagnosis not present

## 2013-05-01 DIAGNOSIS — M129 Arthropathy, unspecified: Secondary | ICD-10-CM | POA: Diagnosis not present

## 2013-05-01 DIAGNOSIS — Z7982 Long term (current) use of aspirin: Secondary | ICD-10-CM | POA: Diagnosis not present

## 2013-05-01 DIAGNOSIS — Z90710 Acquired absence of both cervix and uterus: Secondary | ICD-10-CM | POA: Diagnosis not present

## 2013-05-01 DIAGNOSIS — D472 Monoclonal gammopathy: Secondary | ICD-10-CM | POA: Diagnosis not present

## 2013-05-01 DIAGNOSIS — Z806 Family history of leukemia: Secondary | ICD-10-CM | POA: Diagnosis not present

## 2013-05-01 DIAGNOSIS — K2270 Barrett's esophagus without dysplasia: Secondary | ICD-10-CM | POA: Diagnosis not present

## 2013-05-01 DIAGNOSIS — D7589 Other specified diseases of blood and blood-forming organs: Secondary | ICD-10-CM | POA: Diagnosis not present

## 2013-05-01 DIAGNOSIS — I10 Essential (primary) hypertension: Secondary | ICD-10-CM | POA: Diagnosis not present

## 2013-05-01 DIAGNOSIS — L408 Other psoriasis: Secondary | ICD-10-CM | POA: Diagnosis not present

## 2013-05-01 LAB — BASIC METABOLIC PANEL
Anion Gap: 9 (ref 7–16)
BUN: 17 mg/dL (ref 7–18)
Calcium, Total: 9.2 mg/dL (ref 8.5–10.1)
Chloride: 105 mmol/L (ref 98–107)
Co2: 25 mmol/L (ref 21–32)
Creatinine: 0.88 mg/dL (ref 0.60–1.30)
EGFR (African American): >60
EGFR (Non-African Amer.): >60
Glucose: 89 mg/dL (ref 65–99)
Osmolality: 279 (ref 275–301)
Potassium: 4.2 mmol/L (ref 3.5–5.1)
Sodium: 139 mmol/L (ref 136–145)

## 2013-05-01 LAB — CBC CANCER CENTER
Basophil #: 0 x10 3/mm (ref 0.0–0.1)
Basophil %: 0.5 %
Eosinophil #: 0.1 x10 3/mm (ref 0.0–0.7)
Eosinophil %: 1 %
HCT: 33.8 % — ABNORMAL LOW (ref 35.0–47.0)
HGB: 11.8 g/dL — ABNORMAL LOW (ref 12.0–16.0)
Lymphocyte #: 1.8 x10 3/mm (ref 1.0–3.6)
Lymphocyte %: 26.9 %
MCH: 42 pg — ABNORMAL HIGH (ref 26.0–34.0)
MCHC: 34.9 g/dL (ref 32.0–36.0)
MCV: 120 fL — ABNORMAL HIGH (ref 80–100)
Monocyte #: 0.5 x10 3/mm (ref 0.2–0.9)
Monocyte %: 7.7 %
Neutrophil #: 4.2 x10 3/mm (ref 1.4–6.5)
Neutrophil %: 63.9 %
Platelet: 262 x10 3/mm (ref 150–440)
RBC: 2.81 10*6/uL — ABNORMAL LOW (ref 3.80–5.20)
RDW: 16.5 % — ABNORMAL HIGH (ref 11.5–14.5)
WBC: 6.6 x10 3/mm (ref 3.6–11.0)

## 2013-05-02 LAB — PROT IMMUNOELECTROPHORES(ARMC)

## 2013-05-08 DIAGNOSIS — D472 Monoclonal gammopathy: Secondary | ICD-10-CM | POA: Diagnosis not present

## 2013-05-08 DIAGNOSIS — D7589 Other specified diseases of blood and blood-forming organs: Secondary | ICD-10-CM | POA: Diagnosis not present

## 2013-05-10 DIAGNOSIS — Z23 Encounter for immunization: Secondary | ICD-10-CM | POA: Diagnosis not present

## 2013-05-12 ENCOUNTER — Telehealth: Payer: Self-pay

## 2013-05-12 NOTE — Telephone Encounter (Signed)
Pt wanted Dr Alphonsus Sias to know that she has discontinued the Methotrexate and folic acid and will discuss further with Dr Alphonsus Sias at her appt on 05/19/13. Pt does not request any other med at this time and does not need cb.

## 2013-05-13 NOTE — Telephone Encounter (Signed)
Molli Knock We will review options in the office

## 2013-05-19 ENCOUNTER — Encounter: Payer: Self-pay | Admitting: Internal Medicine

## 2013-05-19 ENCOUNTER — Ambulatory Visit (INDEPENDENT_AMBULATORY_CARE_PROVIDER_SITE_OTHER): Payer: Medicare Other | Admitting: Internal Medicine

## 2013-05-19 VITALS — BP 104/66 | HR 59 | Temp 97.5°F | Wt 124.2 lb

## 2013-05-19 DIAGNOSIS — L4050 Arthropathic psoriasis, unspecified: Secondary | ICD-10-CM

## 2013-05-19 DIAGNOSIS — D539 Nutritional anemia, unspecified: Secondary | ICD-10-CM | POA: Diagnosis not present

## 2013-05-19 DIAGNOSIS — K219 Gastro-esophageal reflux disease without esophagitis: Secondary | ICD-10-CM

## 2013-05-19 DIAGNOSIS — E785 Hyperlipidemia, unspecified: Secondary | ICD-10-CM | POA: Diagnosis not present

## 2013-05-19 DIAGNOSIS — D649 Anemia, unspecified: Secondary | ICD-10-CM | POA: Insufficient documentation

## 2013-05-19 DIAGNOSIS — I10 Essential (primary) hypertension: Secondary | ICD-10-CM | POA: Diagnosis not present

## 2013-05-19 DIAGNOSIS — D638 Anemia in other chronic diseases classified elsewhere: Secondary | ICD-10-CM | POA: Insufficient documentation

## 2013-05-19 LAB — HEPATIC FUNCTION PANEL
ALT: 68 U/L — ABNORMAL HIGH (ref 0–35)
AST: 80 U/L — ABNORMAL HIGH (ref 0–37)
Albumin: 4.3 g/dL (ref 3.5–5.2)
Alkaline Phosphatase: 64 U/L (ref 39–117)
Bilirubin, Direct: 0.1 mg/dL (ref 0.0–0.3)
Total Bilirubin: 0.8 mg/dL (ref 0.3–1.2)
Total Protein: 7 g/dL (ref 6.0–8.3)

## 2013-05-19 LAB — CBC WITH DIFFERENTIAL/PLATELET
Basophils Absolute: 0 10*3/uL (ref 0.0–0.1)
Basophils Relative: 0.3 % (ref 0.0–3.0)
Eosinophils Absolute: 0.1 10*3/uL (ref 0.0–0.7)
Eosinophils Relative: 1.5 % (ref 0.0–5.0)
HCT: 32.9 % — ABNORMAL LOW (ref 36.0–46.0)
Hemoglobin: 11.2 g/dL — ABNORMAL LOW (ref 12.0–15.0)
Lymphocytes Relative: 37.5 % (ref 12.0–46.0)
Lymphs Abs: 1.6 10*3/uL (ref 0.7–4.0)
MCHC: 34 g/dL (ref 30.0–36.0)
MCV: 121.4 fl — ABNORMAL HIGH (ref 78.0–100.0)
Monocytes Absolute: 0.7 10*3/uL (ref 0.1–1.0)
Monocytes Relative: 15.8 % — ABNORMAL HIGH (ref 3.0–12.0)
Neutro Abs: 1.9 10*3/uL (ref 1.4–7.7)
Neutrophils Relative %: 44.9 % (ref 43.0–77.0)
Platelets: 221 10*3/uL (ref 150.0–400.0)
RBC: 2.71 Mil/uL — ABNORMAL LOW (ref 3.87–5.11)
RDW: 16.4 % — ABNORMAL HIGH (ref 11.5–14.6)
WBC: 4.3 10*3/uL — ABNORMAL LOW (ref 4.5–10.5)

## 2013-05-19 LAB — BASIC METABOLIC PANEL
BUN: 24 mg/dL — ABNORMAL HIGH (ref 6–23)
CO2: 25 mEq/L (ref 19–32)
Calcium: 9.9 mg/dL (ref 8.4–10.5)
Chloride: 102 mEq/L (ref 96–112)
Creatinine, Ser: 0.9 mg/dL (ref 0.4–1.2)
GFR: 66.73 mL/min (ref >60.00)
Glucose, Bld: 107 mg/dL — ABNORMAL HIGH (ref 70–99)
Potassium: 5.2 mEq/L — ABNORMAL HIGH (ref 3.5–5.1)
Sodium: 137 mEq/L (ref 135–145)

## 2013-05-19 LAB — TSH: TSH: 1.23 u[IU]/mL (ref 0.35–5.50)

## 2013-05-19 NOTE — Assessment & Plan Note (Signed)
Quiet without Rx °

## 2013-05-19 NOTE — Assessment & Plan Note (Signed)
Ongoing synovitis She is concerned about risks with disease modifying Rx Wants to hold off for now Could retry MTX but at higher dose (15-20mg  per week)

## 2013-05-19 NOTE — Assessment & Plan Note (Signed)
I will monitor for now Back to Dr Orlie Dakin only if worsens

## 2013-05-19 NOTE — Assessment & Plan Note (Signed)
Doesn't want primary prevention Will not recheck

## 2013-05-19 NOTE — Assessment & Plan Note (Signed)
BP Readings from Last 3 Encounters:  05/19/13 104/66  02/03/13 100/60  10/19/12 126/74   Will cut the metoprolol in half Due for labs

## 2013-05-19 NOTE — Progress Notes (Signed)
Subjective:    Patient ID: [name], female    DOB: 13-Jun-1941, 72 y.o.   MRN: [id number]  HPI Here with husband  Took the MTX for over 2 months No effect Stopped it Concerned because Dr Orlie Dakin wasn't too happy she was on this He has released her--needs regular follow up for macrocytic anemia here Hands still hurt but she doesn't want other Rx Tylenol helps some  BP has been low No chest pain No SOB Goes to [employer] regularly Does have fatigue---knows she needs to work on it harder  Gets episodic left neck pain Feels crampy Occasionally over to the right side Worse with weights Romeo Apple gay helps  Current Outpatient Prescriptions on File Prior to Visit  Medication Sig Dispense Refill  . aspirin 81 MG tablet Take 81 mg by mouth daily.      . Cholecalciferol (VITAMIN D PO) Take by mouth 2 (two) times daily.       Marland Kitchen lisinopril (PRINIVIL,ZESTRIL) 10 MG tablet TAKE 1 TABLET BY MOUTH EVERY DAY  90 tablet  1  . LORazepam (ATIVAN) 0.5 MG tablet TAKE 1/2 TO 1 TABLET BY MOUTH TWICE A DAY AS NEEDED FOR ANXIETY  60 tablet  0  . metoprolol (LOPRESSOR) 50 MG tablet TAKE 1 TABLET TWICE A DAY  180 tablet  1  . Multiple Vitamin (MULTIVITAMIN) capsule Take 1 capsule by mouth daily.         No current facility-administered medications on file prior to visit.    Allergies  Allergen Reactions  . Ibuprofen Nausea Only  . Wellbutrin [Bupropion] Other (See Comments)    problems sleeping and nightmares    Past Medical History  Diagnosis Date  . Osteoarthrosis involving, or with mention of more than one site, but not specified as generalized, multiple sites   . Cancer     breast  . GERD (gastroesophageal reflux disease)   . Hyperlipidemia   . Hypertension   . Osteopenia   . Osteoarthritis, multiple sites   . Psoriasis     Past Surgical History  Procedure Laterality Date  . Total hip arthroplasty  2004    right  . Breast lumpectomy  2001    left  . Shoulder surgery   6/07    left  . Abdominal hysterectomy    . Ankle fracture surgery  4/08    left---hardware still in place  . Fracture surgery    . Carotid endarterectomy Left 10/23/2004    Family History  Problem Relation Age of Onset  . Hypertension Brother   . Heart disease Neg Hx   . Diabetes Neg Hx   . Pneumonia Mother   . Leukemia Father     History   Social History  . Marital Status: Married    Spouse Name: N/A    Number of Children: N/A  . Years of Education: N/A   Occupational History  . Marketing     Retired   Social History Main Topics  . Smoking status: Former Smoker -- 1.00 packs/day for 40 years    Types: Cigarettes    Quit date: 07/21/1995  . Smokeless tobacco: Never Used  . Alcohol Use: 7.0 oz/week    14 drink(s) per week  . Drug Use: No  . Sexual Activity: Not on file   Other Topics Concern  . Not on file   Social History Narrative   1 natural child   3 adopted children   Artist---still teaches water colors   Husband has  Altzheimers   Has living will   Daughter Amy is health care POA   Requests DNR    No tube feeds if cognitively unaware   Review of Systems Never a good sleeper--no change. Uses lorazepam when her anxiety keeps her up Appetite is about the same--not great Weight down 1.5# Bowels are okay    Objective:   Physical Exam  Constitutional: She appears well-developed and well-nourished. No distress.  Neck: Normal range of motion. Neck supple. No thyromegaly present.  Cardiovascular: Regular rhythm and normal heart sounds.  Exam reveals no gallop.   No murmur heard. Slight bradycardia  Pulmonary/Chest: Effort normal and breath sounds normal. No respiratory distress. She has no wheezes. She has no rales.  Musculoskeletal: She exhibits no edema.  Still with swelling in PIP joints of hands  Lymphadenopathy:    She has no cervical adenopathy.  Psychiatric: She has a normal mood and affect. Her behavior is normal.          Assessment &  Plan:

## 2013-05-20 ENCOUNTER — Ambulatory Visit: Payer: Self-pay | Admitting: Oncology

## 2013-05-26 ENCOUNTER — Encounter: Payer: Self-pay | Admitting: *Deleted

## 2013-06-05 ENCOUNTER — Other Ambulatory Visit: Payer: Self-pay | Admitting: Internal Medicine

## 2013-06-05 NOTE — Telephone Encounter (Signed)
Last filled 03/18/2013

## 2013-06-05 NOTE — Telephone Encounter (Signed)
Okay #60 x 0 

## 2013-06-06 NOTE — Telephone Encounter (Signed)
rx called into pharmacy

## 2013-07-15 ENCOUNTER — Other Ambulatory Visit: Payer: Self-pay | Admitting: Internal Medicine

## 2013-07-22 ENCOUNTER — Other Ambulatory Visit: Payer: Self-pay | Admitting: Internal Medicine

## 2013-08-12 ENCOUNTER — Other Ambulatory Visit: Payer: Self-pay | Admitting: Internal Medicine

## 2013-08-14 NOTE — Telephone Encounter (Signed)
Okay #60 x 0 

## 2013-08-14 NOTE — Telephone Encounter (Signed)
rx called into pharmacy

## 2013-08-14 NOTE — Telephone Encounter (Signed)
06/05/13 

## 2013-09-12 ENCOUNTER — Other Ambulatory Visit: Payer: Self-pay | Admitting: Vascular Surgery

## 2013-09-12 DIAGNOSIS — Z48812 Encounter for surgical aftercare following surgery on the circulatory system: Secondary | ICD-10-CM

## 2013-09-12 DIAGNOSIS — I6529 Occlusion and stenosis of unspecified carotid artery: Secondary | ICD-10-CM

## 2013-10-17 ENCOUNTER — Other Ambulatory Visit: Payer: Self-pay | Admitting: Internal Medicine

## 2013-10-17 NOTE — Telephone Encounter (Signed)
rx called into pharmacy

## 2013-10-17 NOTE — Telephone Encounter (Signed)
08/12/13 

## 2013-10-17 NOTE — Telephone Encounter (Signed)
Okay #60 x 0 

## 2013-11-03 DIAGNOSIS — H353190 Nonexudative age-related macular degeneration, unspecified eye, stage unspecified: Secondary | ICD-10-CM | POA: Diagnosis not present

## 2013-11-20 ENCOUNTER — Encounter: Payer: Medicare Other | Admitting: Internal Medicine

## 2013-12-13 ENCOUNTER — Encounter: Payer: Self-pay | Admitting: *Deleted

## 2013-12-13 ENCOUNTER — Ambulatory Visit (INDEPENDENT_AMBULATORY_CARE_PROVIDER_SITE_OTHER): Payer: Medicare Other | Admitting: Internal Medicine

## 2013-12-13 ENCOUNTER — Encounter: Payer: Self-pay | Admitting: Internal Medicine

## 2013-12-13 VITALS — BP 118/70 | HR 56 | Temp 98.3°F | Ht 66.5 in | Wt 128.0 lb

## 2013-12-13 DIAGNOSIS — L4050 Arthropathic psoriasis, unspecified: Secondary | ICD-10-CM | POA: Diagnosis not present

## 2013-12-13 DIAGNOSIS — Z23 Encounter for immunization: Secondary | ICD-10-CM

## 2013-12-13 DIAGNOSIS — F39 Unspecified mood [affective] disorder: Secondary | ICD-10-CM | POA: Diagnosis not present

## 2013-12-13 DIAGNOSIS — D539 Nutritional anemia, unspecified: Secondary | ICD-10-CM

## 2013-12-13 DIAGNOSIS — I10 Essential (primary) hypertension: Secondary | ICD-10-CM

## 2013-12-13 DIAGNOSIS — Z79899 Other long term (current) drug therapy: Secondary | ICD-10-CM | POA: Diagnosis not present

## 2013-12-13 DIAGNOSIS — Z853 Personal history of malignant neoplasm of breast: Secondary | ICD-10-CM

## 2013-12-13 DIAGNOSIS — Z0000 Encounter for general adult medical examination without abnormal findings: Secondary | ICD-10-CM | POA: Diagnosis not present

## 2013-12-13 DIAGNOSIS — I6529 Occlusion and stenosis of unspecified carotid artery: Secondary | ICD-10-CM | POA: Diagnosis not present

## 2013-12-13 LAB — COMPREHENSIVE METABOLIC PANEL
ALT: 15 U/L (ref 0–35)
AST: 23 U/L (ref 0–37)
Albumin: 4.2 g/dL (ref 3.5–5.2)
Alkaline Phosphatase: 54 U/L (ref 39–117)
BUN: 19 mg/dL (ref 6–23)
CO2: 23 mEq/L (ref 19–32)
Calcium: 9.9 mg/dL (ref 8.4–10.5)
Chloride: 103 mEq/L (ref 96–112)
Creatinine, Ser: 1 mg/dL (ref 0.4–1.2)
GFR: 58.84 mL/min — ABNORMAL LOW (ref >60.00)
Glucose, Bld: 79 mg/dL (ref 70–99)
Potassium: 5.3 mEq/L — ABNORMAL HIGH (ref 3.5–5.1)
Sodium: 137 mEq/L (ref 135–145)
Total Bilirubin: 0.8 mg/dL (ref 0.2–1.2)
Total Protein: 6.8 g/dL (ref 6.0–8.3)

## 2013-12-13 LAB — CBC WITH DIFFERENTIAL/PLATELET
Basophils Absolute: 0 10*3/uL (ref 0.0–0.1)
Basophils Relative: 0.5 % (ref 0.0–3.0)
Eosinophils Absolute: 0.1 10*3/uL (ref 0.0–0.7)
Eosinophils Relative: 1 % (ref 0.0–5.0)
HCT: 37 % (ref 36.0–46.0)
Hemoglobin: 12.2 g/dL (ref 12.0–15.0)
Lymphocytes Relative: 29.1 % (ref 12.0–46.0)
Lymphs Abs: 1.9 10*3/uL (ref 0.7–4.0)
MCHC: 33 g/dL (ref 30.0–36.0)
MCV: 110.8 fl — ABNORMAL HIGH (ref 78.0–100.0)
Monocytes Absolute: 0.9 10*3/uL (ref 0.1–1.0)
Monocytes Relative: 13.2 % — ABNORMAL HIGH (ref 3.0–12.0)
Neutro Abs: 3.7 10*3/uL (ref 1.4–7.7)
Neutrophils Relative %: 56.2 % (ref 43.0–77.0)
Platelets: 253 10*3/uL (ref 150.0–400.0)
RBC: 3.33 Mil/uL — ABNORMAL LOW (ref 3.87–5.11)
RDW: 13.1 % (ref 11.5–15.5)
WBC: 6.7 10*3/uL (ref 4.0–10.5)

## 2013-12-13 LAB — T4, FREE: Free T4: 0.72 ng/dL (ref 0.60–1.60)

## 2013-12-13 LAB — TSH: TSH: 0.29 u[IU]/mL — ABNORMAL LOW (ref 0.35–4.50)

## 2013-12-13 NOTE — Assessment & Plan Note (Signed)
Discussed ongoing secondary screening She prefers no breast exam Will decrease to every other year mammograms

## 2013-12-13 NOTE — Assessment & Plan Note (Signed)
Gets yearly checks ?

## 2013-12-13 NOTE — Progress Notes (Signed)
Pre visit review using our clinic review tool, if applicable. No additional management support is needed unless otherwise documented below in the visit note. 

## 2013-12-13 NOTE — Assessment & Plan Note (Signed)
Mostly stress with caregiving Uses the lorazepam mostly at night

## 2013-12-13 NOTE — Assessment & Plan Note (Signed)
BP Readings from Last 3 Encounters:  12/13/13 118/70  05/19/13 104/66  02/03/13 100/60   Good control  No changes

## 2013-12-13 NOTE — Assessment & Plan Note (Signed)
I have personally reviewed the Medicare Annual Wellness questionnaire and have noted 1. The patient's medical and social history 2. Their use of alcohol, tobacco or illicit drugs 3. Their current medications and supplements 4. The patient's functional ability including ADL's, fall risks, home safety risks and hearing or visual             impairment. 5. Diet and physical activities 6. Evidence for depression or mood disorders  The patients weight, height, BMI and visual acuity have been recorded in the chart I have made referrals, counseling and provided education to the patient based review of the above and I have provided the pt with a written personalized care plan for preventive services.  I have provided you with a copy of your personalized plan for preventive services. Please take the time to review along with your updated medication list.  UTD on colon Will give prevnar No zostavax since never had varicella Yearly flu shots

## 2013-12-13 NOTE — Assessment & Plan Note (Signed)
Ongoing pain and some disability Due to hematologic issue--will hold off on further Rx NSAIDs haven't helped Will try OTC product

## 2013-12-13 NOTE — Patient Instructions (Signed)
You can try fexofenadine 180 daily or loratadine 10mg  1-2 daily for the drainage.

## 2013-12-13 NOTE — Addendum Note (Signed)
Addended by: Despina Hidden on: 12/13/2013 01:10 PM   Modules accepted: Orders

## 2013-12-13 NOTE — Assessment & Plan Note (Signed)
Will recheck Back to heme if worsens

## 2013-12-13 NOTE — Progress Notes (Signed)
Subjective:    Patient ID: [name], female    DOB: 08/17/1940, 73 y.o.   MRN: [id number]  HPI Here for Medicare wellness and follow up Reviewed her form and advanced directives No doctors beside for eyes and Dr Scot Dock for carotids Does exercise regularly No tobacco or alcohol Independent in instrumental ADLs No cognitive problems Hearing and vision are fine  Ongoing stress with caring for husband with Alzheimers Does get some help from support group He goes twice a week to [facility] Uses the lorazepam to help her sleep---prevents nightmares. Tries to limit to every other night Rare during the day if husband difficult Does have some down times--caregiver stress and limitations due to hands. 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 aspirin 81 MG tablet Take 81 mg by mouth daily.      . Cholecalciferol (VITAMIN D PO) Take by mouth 2 (two) times daily.       Marland Kitchen lisinopril (PRINIVIL,ZESTRIL) 10 MG tablet TAKE 1 TABLET BY MOUTH EVERY DAY  90 tablet  1  . LORazepam (ATIVAN) 0.5 MG tablet TAKE 1/2 - 1 TABLET TWICE A DAY AS NEEDED FOR ANXIETY  60 tablet  0  . metoprolol (LOPRESSOR) 50 MG tablet TAKE 1 TABLET TWICE A DAY  180 tablet  1  . Multiple Vitamin (MULTIVITAMIN) capsule Take 1 capsule by mouth daily.         No current facility-administered medications on file prior to visit.    Allergies  Allergen Reactions  . Ibuprofen Nausea Only  . Wellbutrin [Bupropion] Other (See Comments)   problems sleeping and nightmares    Past Medical History  Diagnosis Date  . Osteoarthrosis involving, or with mention of more than one site, but not specified as generalized, multiple sites   . Cancer     breast  . GERD (gastroesophageal reflux disease)   . Hyperlipidemia   . Hypertension   . Osteopenia   . Osteoarthritis, multiple sites   . Psoriasis     Past Surgical History  Procedure Laterality Date  . Total hip arthroplasty  2004    right  . Breast lumpectomy  2001    left  . Shoulder surgery  6/07    left  . Abdominal hysterectomy    . Ankle fracture surgery  4/08    left---hardware still in place  . Fracture surgery    . Carotid endarterectomy Left 10/23/2004    Family History  Problem Relation Age of Onset  . Hypertension Brother   . Heart disease Neg Hx   . Diabetes Neg Hx   . Pneumonia Mother   . Leukemia Father     History   Social History  . Marital Status: Married    Spouse Name: N/A    Number of Children: N/A  . Years of Education: N/A   Occupational History  . Marketing     Retired   Social History Main Topics  . Smoking status: Former Smoker -- 1.00 packs/day for 40 years  Types: Cigarettes    Quit date: 07/21/1995  . Smokeless tobacco: Never Used  . Alcohol Use: 7.0 oz/week    14 drink(s) per week  . Drug Use: No  . Sexual Activity: Not on file   Other Topics Concern  . Not on file   Social History Narrative   1 natural child   3 adopted children   Artist---still teaches water colors   Husband has Altzheimers      Has living will   Daughter Amy is health care POA   Requests DNR    No tube feeds if cognitively unaware   Review of Systems Appetite is fine Weight is stable Bowels are okay No urinary problems     Objective:   Physical Exam  Constitutional: She is oriented to person, place, and time. She appears well-developed and well-nourished. No distress.  HENT:  Mouth/Throat: Oropharynx is clear and moist. No  oropharyngeal exudate.  Neck: Normal range of motion. Neck supple. No thyromegaly present.  Cardiovascular: Normal rate, regular rhythm, normal heart sounds and intact distal pulses.  Exam reveals no gallop.   No murmur heard. Pulmonary/Chest: Effort normal and breath sounds normal. No respiratory distress. She has no wheezes. She has no rales.  Abdominal: Soft. She exhibits no distension. There is no tenderness. There is no rebound and no guarding.  Musculoskeletal: She exhibits no edema.  Moderate swelling of PIPs in hands  Lymphadenopathy:    She has no cervical adenopathy.  Neurological: She is alert and oriented to person, place, and time.  President-- "Obama, Tawni Pummel, [place] [id number] Recall 3/3  Skin: No rash noted. No erythema.  Psychiatric: She has a normal mood and affect. Her behavior is normal.          Assessment & Plan:

## 2013-12-14 ENCOUNTER — Telehealth: Payer: Self-pay | Admitting: Internal Medicine

## 2013-12-14 NOTE — Telephone Encounter (Signed)
Relevant patient education mailed to patient.  

## 2013-12-19 ENCOUNTER — Encounter: Payer: Self-pay | Admitting: *Deleted

## 2013-12-28 ENCOUNTER — Other Ambulatory Visit: Payer: Self-pay | Admitting: Internal Medicine

## 2013-12-28 NOTE — Telephone Encounter (Signed)
rx called into pharmacy

## 2013-12-28 NOTE — Telephone Encounter (Signed)
Ok to phone in ativan 

## 2013-12-28 NOTE — Telephone Encounter (Signed)
LETVAK PATIENT, Please send back to me for call in Last filled 10/17/13

## 2014-01-01 ENCOUNTER — Encounter: Payer: Self-pay | Admitting: Internal Medicine

## 2014-01-20 ENCOUNTER — Other Ambulatory Visit: Payer: Self-pay | Admitting: Internal Medicine

## 2014-02-15 DIAGNOSIS — R1310 Dysphagia, unspecified: Secondary | ICD-10-CM | POA: Diagnosis not present

## 2014-02-22 ENCOUNTER — Ambulatory Visit: Payer: Self-pay | Admitting: Gastroenterology

## 2014-02-22 DIAGNOSIS — Z87891 Personal history of nicotine dependence: Secondary | ICD-10-CM | POA: Diagnosis not present

## 2014-02-22 DIAGNOSIS — Z79899 Other long term (current) drug therapy: Secondary | ICD-10-CM | POA: Diagnosis not present

## 2014-02-22 DIAGNOSIS — R1310 Dysphagia, unspecified: Secondary | ICD-10-CM | POA: Diagnosis not present

## 2014-02-22 DIAGNOSIS — Z7982 Long term (current) use of aspirin: Secondary | ICD-10-CM | POA: Diagnosis not present

## 2014-02-22 DIAGNOSIS — K2100 Gastro-esophageal reflux disease with esophagitis, without bleeding: Secondary | ICD-10-CM | POA: Diagnosis not present

## 2014-02-22 DIAGNOSIS — Z886 Allergy status to analgesic agent status: Secondary | ICD-10-CM | POA: Diagnosis not present

## 2014-02-23 LAB — PATHOLOGY REPORT

## 2014-02-28 DIAGNOSIS — K2100 Gastro-esophageal reflux disease with esophagitis, without bleeding: Secondary | ICD-10-CM | POA: Diagnosis not present

## 2014-02-28 DIAGNOSIS — R1314 Dysphagia, pharyngoesophageal phase: Secondary | ICD-10-CM | POA: Diagnosis not present

## 2014-03-05 ENCOUNTER — Other Ambulatory Visit: Payer: Self-pay | Admitting: Internal Medicine

## 2014-03-05 NOTE — Telephone Encounter (Signed)
12/28/2013 

## 2014-03-05 NOTE — Telephone Encounter (Signed)
rx called into pharmacy

## 2014-03-05 NOTE — Telephone Encounter (Signed)
Okay #60 x 0 

## 2014-04-25 ENCOUNTER — Ambulatory Visit (HOSPITAL_COMMUNITY)
Admission: RE | Admit: 2014-04-25 | Discharge: 2014-04-25 | Disposition: A | Discharge status: Home / Self Care | Payer: Medicare Other | Source: Ambulatory Visit | Attending: Vascular Surgery | Admitting: Vascular Surgery

## 2014-04-25 DIAGNOSIS — I6529 Occlusion and stenosis of unspecified carotid artery: Secondary | ICD-10-CM | POA: Diagnosis not present

## 2014-04-25 DIAGNOSIS — Z48812 Encounter for surgical aftercare following surgery on the circulatory system: Secondary | ICD-10-CM | POA: Insufficient documentation

## 2014-05-07 ENCOUNTER — Other Ambulatory Visit: Payer: Self-pay | Admitting: Internal Medicine

## 2014-05-07 NOTE — Telephone Encounter (Signed)
03/05/14 

## 2014-05-08 NOTE — Telephone Encounter (Signed)
rx called into pharmacy

## 2014-05-08 NOTE — Telephone Encounter (Signed)
Okay #60 x 0 

## 2014-05-11 DIAGNOSIS — Z23 Encounter for immunization: Secondary | ICD-10-CM | POA: Diagnosis not present

## 2014-06-10 ENCOUNTER — Other Ambulatory Visit: Payer: Self-pay | Admitting: Internal Medicine

## 2014-07-03 ENCOUNTER — Other Ambulatory Visit: Payer: Self-pay | Admitting: Internal Medicine

## 2014-07-03 NOTE — Telephone Encounter (Signed)
05/08/14 

## 2014-07-04 NOTE — Telephone Encounter (Signed)
rx called into pharmacy

## 2014-07-04 NOTE — Telephone Encounter (Signed)
Approved: #[id number] 

## 2014-07-10 ENCOUNTER — Telehealth (HOSPITAL_COMMUNITY): Payer: Self-pay | Admitting: *Deleted

## 2014-07-10 NOTE — Telephone Encounter (Signed)
Patient called and left a voice message requesting results from a carotid duplex exam performed in our office on 04/25/2014.  I attempted to contact patient at 1:27 pm and there was no answer.  I left my contact information.  Amato Sevillano, RVT, RDCS

## 2014-07-10 NOTE — Telephone Encounter (Signed)
I spoke with [name] and gave her the results of her carotid duplex exam done on 04/25/2014.  I informed the patient that there was no significant changes when compared to her previous exam of 10/19/12.  [name], RVT, RDCS

## 2014-07-12 ENCOUNTER — Encounter: Payer: Self-pay | Admitting: Family

## 2014-07-12 ENCOUNTER — Other Ambulatory Visit: Payer: Self-pay

## 2014-07-12 DIAGNOSIS — I6522 Occlusion and stenosis of left carotid artery: Secondary | ICD-10-CM

## 2014-07-12 DIAGNOSIS — Z4889 Encounter for other specified surgical aftercare: Secondary | ICD-10-CM

## 2014-07-12 NOTE — Patient Instructions (Signed)
Dear Ms. Whitacre,  Your recent Vascular Lab study Oct. 7, 2015 indicates: NO significant change noted when compared to the previous exam on October 19, 2012. Please follow up with [place] in 18 months.          Stroke Prevention Some medical conditions and behaviors are associated with an increased chance of having a stroke. You may prevent a stroke by making healthy choices and managing medical conditions. HOW CAN I REDUCE MY RISK OF HAVING A STROKE?   Stay physically active. Get at least 30 minutes of activity on most or all days.  Do not smoke. It may also be helpful to avoid exposure to secondhand smoke.  Limit alcohol use. Moderate alcohol use is considered to be:  No more than 2 drinks per day for men.  No more than 1 drink per day for nonpregnant women.  Eat healthy foods. This involves:  Eating 5 or more servings of fruits and vegetables a day.  Making dietary changes that address high blood pressure (hypertension), high cholesterol, diabetes, or obesity.  Manage your cholesterol levels.  Making food choices that are high in fiber and low in saturated fat, trans fat, and cholesterol may control cholesterol levels.  Take any prescribed medicines to control cholesterol as directed by your health care provider.  Manage your diabetes.  Controlling your carbohydrate and sugar intake is recommended to manage diabetes.  Take any prescribed medicines to control diabetes as directed by your health care provider.  Control your hypertension.  Making food choices that are low in salt (sodium), saturated fat, trans fat, and cholesterol is recommended to manage hypertension.  Take any prescribed medicines to control hypertension as directed by your health care provider.  Maintain a healthy weight.  Reducing calorie intake and making food choices that are low in sodium, saturated fat, trans fat, and cholesterol are recommended to manage weight.  Stop drug abuse.  Avoid  taking birth control pills.  Talk to your health care provider about the risks of taking birth control pills if you are over 15 years old, smoke, get migraines, or have ever had a blood clot.  Get evaluated for sleep disorders (sleep apnea).  Talk to your health care provider about getting a sleep evaluation if you snore a lot or have excessive sleepiness.  Take medicines only as directed by your health care provider.  For some people, aspirin or blood thinners (anticoagulants) are helpful in reducing the risk of forming abnormal blood clots that can lead to stroke. If you have the irregular heart rhythm of atrial fibrillation, you should be on a blood thinner unless there is a good reason you cannot take them.  Understand all your medicine instructions.  Make sure that other conditions (such as anemia or atherosclerosis) are addressed. SEEK IMMEDIATE MEDICAL CARE IF:   You have sudden weakness or numbness of the face, arm, or leg, especially on one side of the body.  Your face or eyelid droops to one side.  You have sudden confusion.  You have trouble speaking (aphasia) or understanding.  You have sudden trouble seeing in one or both eyes.  You have sudden trouble walking.  You have dizziness.  You have a loss of balance or coordination.  You have a sudden, severe headache with no known cause.  You have new chest pain or an irregular heartbeat. Any of these symptoms may represent a serious problem that is an emergency. Do not wait to see if the symptoms will go away.  Get medical help at once. Call your local emergency services (911 in [place] Do not drive yourself to the hospital. Document Released: 08/13/2004 Document Revised: 11/20/2013 Document Reviewed: 01/06/2013 [facility] Patient Information 2015 [place], [place]. This information is not intended to replace advice given to you by your health care provider. Make sure you discuss any questions you have with your health care  provider.

## 2014-07-16 ENCOUNTER — Encounter: Payer: Self-pay | Admitting: Vascular Surgery

## 2014-07-25 ENCOUNTER — Other Ambulatory Visit: Payer: Self-pay | Admitting: Internal Medicine

## 2014-08-29 ENCOUNTER — Other Ambulatory Visit: Payer: Self-pay | Admitting: Internal Medicine

## 2014-08-29 NOTE — Telephone Encounter (Signed)
Approved: #[id number] 

## 2014-08-29 NOTE — Telephone Encounter (Signed)
Last filled 07/04/2014--please advise

## 2014-08-29 NOTE — Telephone Encounter (Signed)
Rx called in to pharmacy. 

## 2014-10-09 ENCOUNTER — Telehealth: Payer: Self-pay | Admitting: Internal Medicine

## 2014-10-09 DIAGNOSIS — Z853 Personal history of malignant neoplasm of breast: Secondary | ICD-10-CM

## 2014-10-09 NOTE — Telephone Encounter (Signed)
Please put in order for MM. Last one done 12/2012 that reccommended annual mammography. I'm am not sure if it needs to be screening or diagnostic b/c the report does not say. Please advise.  [place] Can go any day; early morning  [phone]

## 2014-10-10 NOTE — Telephone Encounter (Signed)
Order placed

## 2014-10-16 NOTE — Telephone Encounter (Signed)
Order cancelled

## 2014-10-16 NOTE — Addendum Note (Signed)
Addended by: Viviana Simpler I on: 10/16/2014 09:09 AM   Modules accepted: Orders

## 2014-10-16 NOTE — Telephone Encounter (Signed)
Just FYI: When scheduled MM with [facility] staff, they stated pt only needed screening MM. After 7 years of regular mammmograms, they switch back to screening.  Scheduled for Wed April 6 at 820am.

## 2014-10-16 NOTE — Telephone Encounter (Signed)
Sorry..please cancel diag bilat mm!

## 2014-10-24 ENCOUNTER — Ambulatory Visit
Admit: 2014-10-24 | Disposition: A | Discharge status: Home / Self Care | Payer: Self-pay | Attending: Internal Medicine | Admitting: Internal Medicine

## 2014-10-24 DIAGNOSIS — Z853 Personal history of malignant neoplasm of breast: Secondary | ICD-10-CM | POA: Diagnosis not present

## 2014-10-24 DIAGNOSIS — Z1231 Encounter for screening mammogram for malignant neoplasm of breast: Secondary | ICD-10-CM | POA: Diagnosis not present

## 2014-10-25 ENCOUNTER — Other Ambulatory Visit: Payer: Self-pay | Admitting: Internal Medicine

## 2014-10-25 NOTE — Telephone Encounter (Signed)
Approved: #[id number] 

## 2014-10-25 NOTE — Telephone Encounter (Signed)
rx called into pharmacy

## 2014-10-25 NOTE — Telephone Encounter (Signed)
08/29/14 

## 2014-10-31 ENCOUNTER — Encounter: Payer: Self-pay | Admitting: Internal Medicine

## 2014-11-01 ENCOUNTER — Encounter: Payer: Self-pay | Admitting: *Deleted

## 2014-11-30 DIAGNOSIS — H26491 Other secondary cataract, right eye: Secondary | ICD-10-CM | POA: Diagnosis not present

## 2014-12-18 ENCOUNTER — Ambulatory Visit (INDEPENDENT_AMBULATORY_CARE_PROVIDER_SITE_OTHER): Payer: Medicare Other | Admitting: Nurse Practitioner

## 2014-12-18 ENCOUNTER — Encounter (INDEPENDENT_AMBULATORY_CARE_PROVIDER_SITE_OTHER): Payer: Self-pay

## 2014-12-18 ENCOUNTER — Encounter: Payer: Self-pay | Admitting: Nurse Practitioner

## 2014-12-18 ENCOUNTER — Encounter: Payer: Medicare Other | Admitting: Internal Medicine

## 2014-12-18 VITALS — BP 128/80 | HR 62 | Temp 98.1°F | Resp 14 | Ht 66.0 in | Wt 127.8 lb

## 2014-12-18 DIAGNOSIS — E785 Hyperlipidemia, unspecified: Secondary | ICD-10-CM | POA: Diagnosis not present

## 2014-12-18 DIAGNOSIS — Z130 Encounter for screening for diseases of the blood and blood-forming organs and certain disorders involving the immune mechanism: Secondary | ICD-10-CM

## 2014-12-18 DIAGNOSIS — R208 Other disturbances of skin sensation: Secondary | ICD-10-CM | POA: Diagnosis not present

## 2014-12-18 DIAGNOSIS — Z131 Encounter for screening for diabetes mellitus: Secondary | ICD-10-CM | POA: Diagnosis not present

## 2014-12-18 DIAGNOSIS — I10 Essential (primary) hypertension: Secondary | ICD-10-CM

## 2014-12-18 DIAGNOSIS — L4050 Arthropathic psoriasis, unspecified: Secondary | ICD-10-CM

## 2014-12-18 DIAGNOSIS — IMO0002 Reserved for concepts with insufficient information to code with codable children: Secondary | ICD-10-CM

## 2014-12-18 LAB — COMPREHENSIVE METABOLIC PANEL
ALT: 14 U/L (ref 0–35)
AST: 21 U/L (ref 0–37)
Albumin: 4.4 g/dL (ref 3.5–5.2)
Alkaline Phosphatase: 53 U/L (ref 39–117)
BUN: 18 mg/dL (ref 6–23)
CO2: 26 mEq/L (ref 19–32)
Calcium: 9.9 mg/dL (ref 8.4–10.5)
Chloride: 104 mEq/L (ref 96–112)
Creatinine, Ser: 0.76 mg/dL (ref 0.40–1.20)
GFR: 78.69 mL/min (ref >60.00)
Glucose, Bld: 85 mg/dL (ref 70–99)
Potassium: 5.4 mEq/L — ABNORMAL HIGH (ref 3.5–5.1)
Sodium: 137 mEq/L (ref 135–145)
Total Bilirubin: 0.4 mg/dL (ref 0.2–1.2)
Total Protein: 7.1 g/dL (ref 6.0–8.3)

## 2014-12-18 LAB — VITAMIN B12: Vitamin B-12: 300 pg/mL (ref 211–911)

## 2014-12-18 LAB — LIPID PANEL
Cholesterol: 233 mg/dL — ABNORMAL HIGH (ref 0–200)
HDL: 80.7 mg/dL (ref >39.00)
LDL Cholesterol: 127 mg/dL — ABNORMAL HIGH (ref 0–99)
NonHDL: 152.3
Total CHOL/HDL Ratio: 3
Triglycerides: 127 mg/dL (ref 0.0–149.0)
VLDL: 25.4 mg/dL (ref 0.0–40.0)

## 2014-12-18 MED ORDER — LORAZEPAM 0.5 MG PO TABS: ORAL_TABLET | ORAL | Status: DC

## 2014-12-18 NOTE — Assessment & Plan Note (Signed)
Obtain updated lipid panel for 2016.

## 2014-12-18 NOTE — Patient Instructions (Addendum)
Nice to meet you!   Please visit the lab before leaving today.   Follow up in 1 year or as needed.

## 2014-12-18 NOTE — Progress Notes (Signed)
Subjective:    Patient ID: [name], female    DOB: 09-Nov-1940, 74 y.o.   MRN: [id number]  HPI  Ms. Ovens is a 74 yo female establishing care today. She was seen by Dr. Silvio Pate in 2015. CC of tingling toes, droopy eyelids, knotty knuckles.   1) New pt into:   Immunizations- Tdap 2012  Mammogram- April 2016   Pap- N/A, Complete hysterectomy   Bone Density- Unsure of date  Colonoscopy- 2 years ago   Eye Exam- UTD 11/2014   Dental Exam- UTD  3) Acute Problems-  Tingling toes- Only when standing up from getting out of bed, goes away shortly after moving around.   [employer]- twice a week [employer]akers   Droopy eyelids- just concerned about aesthetics  Knotty pointer finger of right hand- discussed callus   Review of Systems  Constitutional: Negative for fever, chills, diaphoresis and fatigue.  Respiratory: Negative for chest tightness, shortness of breath and wheezing.   Cardiovascular: Negative for chest pain, palpitations and leg swelling.  Gastrointestinal: Negative for nausea, vomiting, diarrhea and rectal pain.  Skin: Negative for rash.  Neurological: Negative for dizziness, weakness, numbness and headaches.  Psychiatric/Behavioral: The patient is not nervous/anxious.    Past Medical History  Diagnosis Date  . Osteoarthrosis involving, or with mention of more than one site, but not specified as generalized, multiple sites   . Cancer     breast  . GERD (gastroesophageal reflux disease)   . Hyperlipidemia   . Hypertension   . Osteopenia   . Osteoarthritis, multiple sites   . Psoriasis     History   Social History  . Marital Status: Married    Spouse Name: N/A  . Number of Children: N/A  . Years of Education: N/A   Occupational History  . Marketing     Retired   Social History Main Topics  . Smoking status: Former Smoker -- 1.00 packs/day for 40 years    Types: Cigarettes    Quit date: 07/21/1995  . Smokeless tobacco: Never Used  . Alcohol Use: 8.4  oz/week    14 Standard drinks or equivalent per week  . Drug Use: No  . Sexual Activity: No   Other Topics Concern  . Not on file   Social History Narrative   1 natural child   3 adopted children   Artist---still teaches water colors   Husband has Alzheimers      Has living will   Daughter Amy is health care POA   DNR    No tube feeds if cognitively unaware    Past Surgical History  Procedure Laterality Date  . Total hip arthroplasty  2004    right  . Breast lumpectomy  2001    left  . Shoulder surgery  6/07    left  . Abdominal hysterectomy    . Ankle fracture surgery  4/08    left---hardware still in place  . Fracture surgery    . Carotid endarterectomy Left 10/23/2004  . Tonsillectomy and adenoidectomy      Family History  Problem Relation Age of Onset  . Hypertension Brother   . Heart disease Neg Hx   . Diabetes Neg Hx   . Pneumonia Mother   . Leukemia Father     Allergies  Allergen Reactions  . Ibuprofen Nausea Only  . Wellbutrin [Bupropion] Other (See Comments)    problems sleeping and nightmares    Current Outpatient Prescriptions on File Prior to Visit  Medication Sig Dispense Refill  . aspirin 81 MG tablet Take 81 mg by mouth daily.    . Cholecalciferol (VITAMIN D PO) Take by mouth 2 (two) times daily.     Marland Kitchen lisinopril (PRINIVIL,ZESTRIL) 10 MG tablet TAKE 1 TABLET BY MOUTH EVERY DAY 90 tablet 3  . metoprolol (LOPRESSOR) 50 MG tablet TAKE 1 TABLET TWICE A DAY 180 tablet 1  . Multiple Vitamin (MULTIVITAMIN) capsule Take 1 capsule by mouth daily.       No current facility-administered medications on file prior to visit.      Objective:   Physical Exam  Constitutional: She is oriented to person, place, and time. She appears well-developed and well-nourished. No distress.  BP 128/80 mmHg  Pulse 62  Temp(Src) 98.1 F (36.7 C) (Oral)  Resp 14  Ht 5\' 6"  (1.676 m)  Wt 127 lb 12.8 oz (57.97 kg)  BMI 20.64 kg/m2  SpO2 96%  LMP    HENT:  Head:  Normocephalic and atraumatic.  Right Ear: External ear normal.  Left Ear: External ear normal.  Eyes: EOM are normal. Pupils are equal, round, and reactive to light. Right eye exhibits no discharge. Left eye exhibits no discharge. No scleral icterus.  Cardiovascular: Normal rate, regular rhythm, normal heart sounds and intact distal pulses.  Exam reveals no gallop and no friction rub.   No murmur heard. Pulmonary/Chest: Effort normal and breath sounds normal. No respiratory distress. She has no wheezes. She has no rales. She exhibits no tenderness.  Neurological: She is alert and oriented to person, place, and time. No cranial nerve deficit. She exhibits normal muscle tone. Coordination normal.  Skin: Skin is warm and dry. No rash noted. She is not diaphoretic.  Varicose veins of legs, prominent  Psychiatric: She has a normal mood and affect. Her behavior is normal. Judgment and thought content normal.      Assessment & Plan:

## 2014-12-18 NOTE — Assessment & Plan Note (Signed)
Will check B12 level. Pt has occasional numbness of toes when standing after lying down. This is relieved with moving around.

## 2014-12-18 NOTE — Assessment & Plan Note (Signed)
Stable. Getting updated CMET  BP Readings from Last 3 Encounters:  12/18/14 128/80  12/13/13 118/70  05/19/13 104/66

## 2014-12-18 NOTE — Assessment & Plan Note (Signed)
Discussed possible treatment options and referrals. She would like to try NSAIDs again. Advised slowly try advil for short period of time and take something to protect stomach like Zantac.

## 2014-12-18 NOTE — Progress Notes (Signed)
Pre visit review using our clinic review tool, if applicable. No additional management support is needed unless otherwise documented below in the visit note. 

## 2014-12-19 ENCOUNTER — Other Ambulatory Visit: Payer: Self-pay | Admitting: Internal Medicine

## 2014-12-19 LAB — CBC WITH DIFFERENTIAL/PLATELET
Basophils Absolute: 0.1 10*3/uL (ref 0.0–0.1)
Basophils Relative: 2.3 % (ref 0.0–3.0)
Eosinophils Absolute: 0.1 10*3/uL (ref 0.0–0.7)
Eosinophils Relative: 2.1 % (ref 0.0–5.0)
HCT: 36.8 % (ref 36.0–46.0)
Hemoglobin: 12.2 g/dL (ref 12.0–15.0)
Lymphocytes Relative: 33.1 % (ref 12.0–46.0)
Lymphs Abs: 1.9 10*3/uL (ref 0.7–4.0)
MCHC: 33.2 g/dL (ref 30.0–36.0)
MCV: 110.1 fl — ABNORMAL HIGH (ref 78.0–100.0)
Monocytes Absolute: 0.7 10*3/uL (ref 0.1–1.0)
Monocytes Relative: 11.8 % (ref 3.0–12.0)
Neutro Abs: 2.9 10*3/uL (ref 1.4–7.7)
Neutrophils Relative %: 50.7 % (ref 43.0–77.0)
Platelets: 250 10*3/uL (ref 150.0–400.0)
RBC: 3.34 Mil/uL — ABNORMAL LOW (ref 3.87–5.11)
RDW: 13.6 % (ref 11.5–15.5)
WBC: 5.8 10*3/uL (ref 4.0–10.5)

## 2014-12-19 LAB — HEMOGLOBIN A1C: Hgb A1c MFr Bld: 5.3 % (ref 4.6–6.5)

## 2014-12-20 ENCOUNTER — Other Ambulatory Visit: Payer: Self-pay | Admitting: Nurse Practitioner

## 2014-12-20 DIAGNOSIS — E875 Hyperkalemia: Secondary | ICD-10-CM

## 2014-12-22 ENCOUNTER — Other Ambulatory Visit: Payer: Self-pay | Admitting: Nurse Practitioner

## 2014-12-24 ENCOUNTER — Other Ambulatory Visit: Payer: Self-pay | Admitting: Nurse Practitioner

## 2014-12-24 NOTE — Telephone Encounter (Signed)
Last OV 5.31.16, next OV 6.6.17. Please advise on refill

## 2014-12-26 ENCOUNTER — Other Ambulatory Visit (INDEPENDENT_AMBULATORY_CARE_PROVIDER_SITE_OTHER): Payer: Medicare Other

## 2014-12-26 DIAGNOSIS — E875 Hyperkalemia: Secondary | ICD-10-CM

## 2014-12-26 LAB — POTASSIUM: Potassium: 5.4 mEq/L — ABNORMAL HIGH (ref 3.5–5.1)

## 2015-01-01 ENCOUNTER — Encounter: Payer: Self-pay | Admitting: Nurse Practitioner

## 2015-01-01 ENCOUNTER — Ambulatory Visit (INDEPENDENT_AMBULATORY_CARE_PROVIDER_SITE_OTHER): Payer: Medicare Other | Admitting: Nurse Practitioner

## 2015-01-01 VITALS — BP 118/68 | HR 78 | Temp 98.0°F | Resp 14 | Ht 66.0 in | Wt 126.8 lb

## 2015-01-01 DIAGNOSIS — E875 Hyperkalemia: Secondary | ICD-10-CM | POA: Diagnosis not present

## 2015-01-01 LAB — POTASSIUM: Potassium: 5.6 mEq/L — ABNORMAL HIGH (ref 3.5–5.1)

## 2015-01-01 NOTE — Progress Notes (Signed)
   Subjective:    Patient ID: [name], female    DOB: [birth year], 74 y.o.   MRN: [id number]  HPI  [name] is a 74 yo female here for a follow up and questions about her potassium labs.   1) Wants a list of foods, wants to know about conditions, why it would be so high ect... She has done some research, but seems concerned about info found on the web.   Review of Systems  Constitutional: Negative for fever, chills, diaphoresis and fatigue.  Respiratory: Negative for chest tightness, shortness of breath and wheezing.   Cardiovascular: Negative for chest pain, palpitations and leg swelling.  Gastrointestinal: Negative for nausea, vomiting and diarrhea.  Skin: Negative for rash.  Neurological: Negative for dizziness, weakness, numbness and headaches.  Psychiatric/Behavioral: The patient is not nervous/anxious.        Objective:   Physical Exam  Constitutional: She is oriented to person, place, and time. She appears well-developed and well-nourished. No distress.  BP 118/68 mmHg  Pulse 78  Temp(Src) 98 F (36.7 C) (Oral)  Resp 14  Ht 5\' 6"  (1.676 m)  Wt 126 lb 12.8 oz (57.516 kg)  BMI 20.48 kg/m2  SpO2 95%   HENT:  Head: Normocephalic and atraumatic.  Right Ear: External ear normal.  Left Ear: External ear normal.  Cardiovascular: Normal rate and regular rhythm.   Pulmonary/Chest: Effort normal and breath sounds normal.  Neurological: She is alert and oriented to person, place, and time. Coordination normal.  Skin: Skin is warm and dry. No rash noted. She is not diaphoretic.  Psychiatric: She has a normal mood and affect. Her behavior is normal. Judgment and thought content normal.      Assessment & Plan:

## 2015-01-01 NOTE — Progress Notes (Signed)
Pre visit review using our clinic review tool, if applicable. No additional management support is needed unless otherwise documented below in the visit note. 

## 2015-01-01 NOTE — Assessment & Plan Note (Signed)
Gave pt lots of info about potassium, the correct levels, foods high and low in K+. Pt denies any symptoms such as weakness, nausea, or arrhythmias. Want to keep an eye on trend of K+ so will obtain repeat at today's visit. FU prn worsening/failure to improve.

## 2015-01-01 NOTE — Patient Instructions (Signed)
We will call you with the results and follow up in 1 year.

## 2015-01-03 ENCOUNTER — Other Ambulatory Visit: Payer: Self-pay | Admitting: Nurse Practitioner

## 2015-01-04 ENCOUNTER — Other Ambulatory Visit: Payer: Self-pay | Admitting: Nurse Practitioner

## 2015-01-04 DIAGNOSIS — E875 Hyperkalemia: Secondary | ICD-10-CM

## 2015-01-08 ENCOUNTER — Other Ambulatory Visit (INDEPENDENT_AMBULATORY_CARE_PROVIDER_SITE_OTHER): Payer: Medicare Other

## 2015-01-08 DIAGNOSIS — E875 Hyperkalemia: Secondary | ICD-10-CM

## 2015-01-08 LAB — POTASSIUM: Potassium: 5.1 mEq/L (ref 3.5–5.1)

## 2015-01-15 ENCOUNTER — Telehealth: Payer: Self-pay

## 2015-01-15 NOTE — Telephone Encounter (Signed)
Informed pt that [name] had received BP readings and that she had a good average, not changing BP meds at this time. He potassium looked really good off the Lisinopril.

## 2015-01-15 NOTE — Telephone Encounter (Signed)
-----   Message from Rubbie Battiest, NP sent at 01/14/2015 11:29 AM EDT ----- Please let [name] know I have received her BPs and reviewed them. She has a great average and I will not replace her BP medication at this time. Her Potassium looked great off of lisinopril. Thanks!

## 2015-02-21 DIAGNOSIS — Z96641 Presence of right artificial hip joint: Secondary | ICD-10-CM | POA: Diagnosis not present

## 2015-03-01 ENCOUNTER — Other Ambulatory Visit: Payer: Self-pay | Admitting: Nurse Practitioner

## 2015-03-01 NOTE — Telephone Encounter (Signed)
Ok to fill 

## 2015-03-06 ENCOUNTER — Other Ambulatory Visit: Payer: Self-pay | Admitting: Nurse Practitioner

## 2015-03-06 DIAGNOSIS — F39 Unspecified mood [affective] disorder: Secondary | ICD-10-CM

## 2015-03-06 NOTE — Telephone Encounter (Signed)
Last Ov 6.14.16, next OV 6.6.17. Please advise refill

## 2015-03-07 NOTE — Telephone Encounter (Signed)
Rx faxed to pharmacy on 8.17.16

## 2015-03-07 NOTE — Telephone Encounter (Signed)
Rx sent on 8.17.16

## 2015-05-10 DIAGNOSIS — Z23 Encounter for immunization: Secondary | ICD-10-CM | POA: Diagnosis not present

## 2015-05-15 ENCOUNTER — Other Ambulatory Visit: Payer: Self-pay | Admitting: Nurse Practitioner

## 2015-05-15 NOTE — Telephone Encounter (Signed)
Please advise refill? Last OV was in june

## 2015-05-29 ENCOUNTER — Telehealth: Payer: Self-pay

## 2015-05-29 NOTE — Telephone Encounter (Signed)
Left message for patient to call back when ready to scheduled.

## 2015-05-29 NOTE — Telephone Encounter (Signed)
Pt would like to know if she can be switch to another provider. She feels that having  MD would be better since she is own medicare. Are you ok with this? If so who would you like to put her with?

## 2015-05-29 NOTE — Telephone Encounter (Signed)
Dr. Caryl Bis has agreed to take her as a patient. I am okay with the switch. She can see me for acute needs if she wishes.

## 2015-07-13 ENCOUNTER — Other Ambulatory Visit: Payer: Self-pay | Admitting: Nurse Practitioner

## 2015-07-16 NOTE — Telephone Encounter (Signed)
Advise? Patient seen in May and medication refilled in 05/15/15.

## 2015-08-17 ENCOUNTER — Other Ambulatory Visit: Payer: Self-pay | Admitting: Internal Medicine

## 2015-09-12 ENCOUNTER — Ambulatory Visit (INDEPENDENT_AMBULATORY_CARE_PROVIDER_SITE_OTHER): Payer: Medicare Other

## 2015-09-12 VITALS — BP 122/76 | HR 62 | Temp 97.4°F | Resp 12 | Ht 66.0 in | Wt 125.4 lb

## 2015-09-12 DIAGNOSIS — Z0000 Encounter for general adult medical examination without abnormal findings: Secondary | ICD-10-CM | POA: Diagnosis not present

## 2015-09-12 NOTE — Patient Instructions (Addendum)
Ms. Waage , Thank you for taking time to come for your Medicare Wellness Visit. I appreciate your ongoing commitment to your health goals. Please review the following plan we discussed and let me know if I can assist you in the future.      This is a list of the screening recommended for you and due dates:  Health Maintenance  Topic Date Due  . Flu Shot  02/18/2016  . Mammogram  10/29/2016  . Tetanus Vaccine  03/07/2021  . DEXA scan (bone density measurement)  Completed  . Shingles Vaccine  Addressed  . Pneumonia vaccines  Completed    Health Maintenance, Female Adopting a healthy lifestyle and getting preventive care can go a long way to promote health and wellness. Talk with your health care provider about what schedule of regular examinations is right for you. This is a good chance for you to check in with your provider about disease prevention and staying healthy. In between checkups, there are plenty of things you can do on your own. Experts have done a lot of research about which lifestyle changes and preventive measures are most likely to keep you healthy. Ask your health care provider for more information. WEIGHT AND DIET  Eat a healthy diet  Be sure to include plenty of vegetables, fruits, low-fat dairy products, and lean protein.  Do not eat a lot of foods high in solid fats, added sugars, or salt.  Get regular exercise. This is one of the most important things you can do for your health.  Most adults should exercise for at least 150 minutes each week. The exercise should increase your heart rate and make you sweat (moderate-intensity exercise).  Most adults should also do strengthening exercises at least twice a week. This is in addition to the moderate-intensity exercise.  Maintain a healthy weight  Body mass index (BMI) is a measurement that can be used to identify possible weight problems. It estimates body fat based on height and weight. Your health care provider  can help determine your BMI and help you achieve or maintain a healthy weight.  For females 20 years of age and older:   A BMI below 18.5 is considered underweight.  A BMI of 18.5 to 24.9 is normal.  A BMI of 25 to 29.9 is considered overweight.  A BMI of 30 and above is considered obese.  Watch levels of cholesterol and blood lipids  You should start having your blood tested for lipids and cholesterol at 77 years of age, then have this test every 5 years.  You may need to have your cholesterol levels checked more often if:  Your lipid or cholesterol levels are high.  You are older than 77 years of age.  You are at high risk for heart disease.  CANCER SCREENING   Lung Cancer  Lung cancer screening is recommended for adults 55-80 years old who are at high risk for lung cancer because of a history of smoking.  A yearly low-dose CT scan of the lungs is recommended for people who:  Currently smoke.  Have quit within the past 15 years.  Have at least a 30-pack-year history of smoking. A pack year is smoking an average of one pack of cigarettes a day for 1 year.  Yearly screening should continue until it has been 15 years since you quit.  Yearly screening should stop if you develop a health problem that would prevent you from having lung cancer treatment.  Breast Cancer    Practice breast self-awareness. This means understanding how your breasts normally appear and feel.  It also means doing regular breast self-exams. Let your health care provider know about any changes, no matter how small.  If you are in your 20s or 30s, you should have a clinical breast exam (CBE) by a health care provider every 1-3 years as part of a regular health exam.  If you are 40 or older, have a CBE every year. Also consider having a breast X-ray (mammogram) every year.  If you have a family history of breast cancer, talk to your health care provider about genetic screening.  If you are at  high risk for breast cancer, talk to your health care provider about having an MRI and a mammogram every year.  Breast cancer gene (BRCA) assessment is recommended for women who have family members with BRCA-related cancers. BRCA-related cancers include:  Breast.  Ovarian.  Tubal.  Peritoneal cancers.  Results of the assessment will determine the need for genetic counseling and BRCA1 and BRCA2 testing. Cervical Cancer Your health care provider may recommend that you be screened regularly for cancer of the pelvic organs (ovaries, uterus, and vagina). This screening involves a pelvic examination, including checking for microscopic changes to the surface of your cervix (Pap test). You may be encouraged to have this screening done every 3 years, beginning at age 21.  For women ages 30-65, health care providers may recommend pelvic exams and Pap testing every 3 years, or they may recommend the Pap and pelvic exam, combined with testing for human papilloma virus (HPV), every 5 years. Some types of HPV increase your risk of cervical cancer. Testing for HPV may also be done on women of any age with unclear Pap test results.  Other health care providers may not recommend any screening for nonpregnant women who are considered low risk for pelvic cancer and who do not have symptoms. Ask your health care provider if a screening pelvic exam is right for you.  If you have had past treatment for cervical cancer or a condition that could lead to cancer, you need Pap tests and screening for cancer for at least 20 years after your treatment. If Pap tests have been discontinued, your risk factors (such as having a new sexual partner) need to be reassessed to determine if screening should resume. Some women have medical problems that increase the chance of getting cervical cancer. In these cases, your health care provider may recommend more frequent screening and Pap tests. Colorectal Cancer  This type of cancer  can be detected and often prevented.  Routine colorectal cancer screening usually begins at 77 years of age and continues through 77 years of age.  Your health care provider may recommend screening at an earlier age if you have risk factors for colon cancer.  Your health care provider may also recommend using home test kits to check for hidden blood in the stool.  A small camera at the end of a tube can be used to examine your colon directly (sigmoidoscopy or colonoscopy). This is done to check for the earliest forms of colorectal cancer.  Routine screening usually begins at age 50.  Direct examination of the colon should be repeated every 5-10 years through 77 years of age. However, you may need to be screened more often if early forms of precancerous polyps or small growths are found. Skin Cancer  Check your skin from head to toe regularly.  Tell your health care provider about any   new moles or changes in moles, especially if there is a change in a mole's shape or color.  Also tell your health care provider if you have a mole that is larger than the size of a pencil eraser.  Always use sunscreen. Apply sunscreen liberally and repeatedly throughout the day.  Protect yourself by wearing long sleeves, pants, a wide-brimmed hat, and sunglasses whenever you are outside. HEART DISEASE, DIABETES, AND HIGH BLOOD PRESSURE   High blood pressure causes heart disease and increases the risk of stroke. High blood pressure is more likely to develop in:  People who have blood pressure in the high end of the normal range (130-139/85-89 mm Hg).  People who are overweight or obese.  People who are African American.  If you are 56-32 years of age, have your blood pressure checked every 3-5 years. If you are 55 years of age or older, have your blood pressure checked every year. You should have your blood pressure measured twice--once when you are at a hospital or clinic, and once when you are not at a  hospital or clinic. Record the average of the two measurements. To check your blood pressure when you are not at a hospital or clinic, you can use:  An automated blood pressure machine at a pharmacy.  A home blood pressure monitor.  If you are between 18 years and 107 years old, ask your health care provider if you should take aspirin to prevent strokes.  Have regular diabetes screenings. This involves taking a blood sample to check your fasting blood sugar level.  If you are at a normal weight and have a low risk for diabetes, have this test once every three years after 77 years of age.  If you are overweight and have a high risk for diabetes, consider being tested at a younger age or more often. PREVENTING INFECTION  Hepatitis B  If you have a higher risk for hepatitis B, you should be screened for this virus. You are considered at high risk for hepatitis B if:  You were born in a country where hepatitis B is common. Ask your health care provider which countries are considered high risk.  Your parents were born in a high-risk country, and you have not been immunized against hepatitis B (hepatitis B vaccine).  You have HIV or AIDS.  You use needles to inject street drugs.  You live with someone who has hepatitis B.  You have had sex with someone who has hepatitis B.  You get hemodialysis treatment.  You take certain medicines for conditions, including cancer, organ transplantation, and autoimmune conditions. Hepatitis C  Blood testing is recommended for:  Everyone born from 18 through 1965.  Anyone with known risk factors for hepatitis C. Sexually transmitted infections (STIs)  You should be screened for sexually transmitted infections (STIs) including gonorrhea and chlamydia if:  You are sexually active and are younger than 77 years of age.  You are older than 77 years of age and your health care provider tells you that you are at risk for this type of  infection.  Your sexual activity has changed since you were last screened and you are at an increased risk for chlamydia or gonorrhea. Ask your health care provider if you are at risk.  If you do not have HIV, but are at risk, it may be recommended that you take a prescription medicine daily to prevent HIV infection. This is called pre-exposure prophylaxis (PrEP). You are considered at risk  if:  You are sexually active and do not regularly use condoms or know the HIV status of your partner(s).  You take drugs by injection.  You are sexually active with a partner who has HIV. Talk with your health care provider about whether you are at high risk of being infected with HIV. If you choose to begin PrEP, you should first be tested for HIV. You should then be tested every 3 months for as long as you are taking PrEP.  PREGNANCY   If you are premenopausal and you may become pregnant, ask your health care provider about preconception counseling.  If you may become pregnant, take 400 to 800 micrograms (mcg) of folic acid every day.  If you want to prevent pregnancy, talk to your health care provider about birth control (contraception). OSTEOPOROSIS AND MENOPAUSE   Osteoporosis is a disease in which the bones lose minerals and strength with aging. This can result in serious bone fractures. Your risk for osteoporosis can be identified using a bone density scan.  If you are 55 years of age or older, or if you are at risk for osteoporosis and fractures, ask your health care provider if you should be screened.  Ask your health care provider whether you should take a calcium or vitamin D supplement to lower your risk for osteoporosis.  Menopause may have certain physical symptoms and risks.  Hormone replacement therapy may reduce some of these symptoms and risks. Talk to your health care provider about whether hormone replacement therapy is right for you.  HOME CARE INSTRUCTIONS   Schedule regular  health, dental, and eye exams.  Stay current with your immunizations.   Do not use any tobacco products including cigarettes, chewing tobacco, or electronic cigarettes.  If you are pregnant, do not drink alcohol.  If you are breastfeeding, limit how much and how often you drink alcohol.  Limit alcohol intake to no more than 1 drink per day for nonpregnant women. One drink equals 12 ounces of beer, 5 ounces of wine, or 1 ounces of hard liquor.  Do not use street drugs.  Do not share needles.  Ask your health care provider for help if you need support or information about quitting drugs.  Tell your health care provider if you often feel depressed.  Tell your health care provider if you have ever been abused or do not feel safe at home.   This information is not intended to replace advice given to you by your health care provider. Make sure you discuss any questions you have with your health care provider.   Document Released: 01/19/2011 Document Revised: 07/27/2014 Document Reviewed: 06/07/2013 Elsevier Interactive Patient Education [employer].

## 2015-09-12 NOTE — Progress Notes (Signed)
Subjective:   [name] is a 75 y.o. female who presents for Medicare Annual (Subsequent) preventive examination.  Review of Systems:  No ROS.  Medicare Wellness Visit.  Cardiac Risk Factors include: advanced age (>44men, >29 women);hypertension     Objective:     Vitals: BP 122/76 mmHg  Pulse 62  Temp(Src) 97.4 F (36.3 C) (Oral)  Resp 12  Ht 5\' 6"  (1.676 m)  Wt 125 lb 6.4 oz (56.881 kg)  BMI 20.25 kg/m2  SpO2 98%  Tobacco History  Smoking status  . Former Smoker -- 1.00 packs/day for 40 years  . Types: Cigarettes  . Quit date: 07/21/1995  Smokeless tobacco  . Never Used     Counseling given: Not Answered   Past Medical History  Diagnosis Date  . Osteoarthrosis involving, or with mention of more than one site, but not specified as generalized, multiple sites   . Cancer (HCC)     breast  . GERD (gastroesophageal reflux disease)   . Hyperlipidemia   . Hypertension   . Osteopenia   . Osteoarthritis, multiple sites   . Psoriasis    Past Surgical History  Procedure Laterality Date  . Total hip arthroplasty  2004    right  . Breast lumpectomy  2001    left  . Shoulder surgery  6/07    left  . Abdominal hysterectomy    . Ankle fracture surgery  4/08    left---hardware still in place  . Fracture surgery    . Carotid endarterectomy Left 10/23/2004  . Tonsillectomy and adenoidectomy     Family History  Problem Relation Age of Onset  . Hypertension Brother   . Heart disease Neg Hx   . Diabetes Neg Hx   . Pneumonia Mother   . Leukemia Father    History  Sexual Activity  . Sexual Activity: No    Outpatient Encounter Prescriptions as of 09/12/2015  Medication Sig  . aspirin 81 MG tablet Take 81 mg by mouth daily.  . Cholecalciferol (VITAMIN D PO) Take by mouth 2 (two) times daily.   Marland Kitchen LORazepam (ATIVAN) 0.5 MG tablet TAKE 1/2 TO 1 TABLET BY MOUTH TWICE A DAY AS NEEDED  . metoprolol (LOPRESSOR) 50 MG tablet TAKE 1 TABLET TWICE A DAY  .  Multiple Vitamin (MULTIVITAMIN) capsule Take 1 capsule by mouth daily.    . Multiple Vitamins-Minerals (PRESERVISION AREDS 2) CAPS Take 1 tablet by mouth 2 (two) times daily.   No facility-administered encounter medications on file as of 09/12/2015.    Activities of Daily Living In your present state of health, do you have any difficulty performing the following activities: 09/12/2015  Hearing? N  Vision? N  Difficulty concentrating or making decisions? N  Walking or climbing stairs? N  Dressing or bathing? N  Doing errands, shopping? N  Preparing Food and eating ? N  Using the Toilet? N  In the past six months, have you accidently leaked urine? N  Do you have problems with loss of bowel control? N  Managing your Medications? N  Managing your Finances? N  Housekeeping or managing your Housekeeping? N    Patient Care Team: [name], MD as PCP - General (Family Medicine)    Assessment:   This is a routine wellness examination for [place]. The goal of the wellness visit is to assist the patient how to close the gaps in care and create a preventative care plan for the patient.   Taking VIT  D Calcium as appropriate/Osteoporosis risk reviewed.  Medications reviewed; taking without issues or barriers.  Safety issues reviewed; smoke detectors in the home. No firearms in the home. Wears seatbelts when driving or riding with others. No violence in the home.  No identified risk were noted; The patient was oriented x 3; appropriate in dress and manner and no objective failures at ADL's or IADL's.   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 Nehemiah Massed Episode mood disorder-stable and followed by PCP Unspecified mood (affective) disorder-stable and followed by PCP  Patient Concerns:  C/O Arthritis in the hands getting worse.  Unsteady gait; no falls. No cane/walker used.  Tingles in both feet.  Declines  immediate follow up appointment.  Requests to wait for upcoming appointment in May.  Encouraged to follow up with PCP as needed.    Exercise Activities and Dietary recommendations Current Exercise Habits:: Structured exercise class (Attends [employer] at the [facility]), Time (Minutes): 60, Frequency (Times/Week): 2, Weekly Exercise (Minutes/Week): 120, Intensity: Mild  Goals    . Healthy Lifestyle     Maintain exercise regiment Stay hydrated!  Drink plenty of water Choose low carb foods, fruits and vegetables      Fall Risk Fall Risk  09/12/2015 12/18/2014 12/13/2013 08/05/2012  Falls in the past year? No No No Yes  Number falls in past yr: - - - 1  Injury with Fall? - - - Yes   Depression Screen PHQ 2/9 Scores 09/12/2015 12/18/2014 12/13/2013 08/05/2012  PHQ - 2 Score 0 2 1 0  PHQ- 9 Score - 2 - -     Cognitive Testing MMSE - Mini Mental State Exam 09/12/2015  Orientation to time 5  Orientation to Place 5  Registration 3  Attention/ Calculation 5  Recall 3  Language- name 2 objects 2  Language- repeat 1  Language- follow 3 step command 3  Language- read & follow direction 1  Write a sentence 1  Copy design 1  Total score 30    Immunization History  Administered Date(s) Administered  . Influenza Split 05/20/2011, 05/10/2013  . Influenza, High Dose Seasonal PF 05/10/2015  . Influenza-Unspecified 05/11/2014  . Pneumococcal Conjugate-13 12/13/2013  . Pneumococcal Polysaccharide-23 06/19/2009  . Tdap 03/08/2011   Screening Tests Health Maintenance  Topic Date Due  . INFLUENZA VACCINE  02/18/2016  . MAMMOGRAM  10/29/2016  . TETANUS/TDAP  03/07/2021  . DEXA SCAN  Completed  . ZOSTAVAX  Addressed  . PNA vac Low Risk Adult  Completed      Plan:    End of life planning; Advance aging; Advanced directives discussed. Copy of current HCPOA/Living Will requested.  During the course of the visit the patient was educated and counseled about the following appropriate screening  and preventive services:   Vaccines to include Pneumoccal, Influenza, Hepatitis B, Td, Zostavax, HCV  Electrocardiogram  Cardiovascular Disease  Colorectal cancer screening  Bone density screening  Diabetes screening  Glaucoma screening  Mammography/PAP  Nutrition counseling   Patient Instructions (the written plan) was given to the patient.   Varney Biles, LPN  [id number]

## 2015-09-17 ENCOUNTER — Other Ambulatory Visit: Payer: Self-pay | Admitting: Nurse Practitioner

## 2015-09-17 NOTE — Telephone Encounter (Signed)
Fine to refill x 1 month. This pt has not yet been seen by Dr. Caryl Bis. Looks like previously seen by Dr. Silvio Pate. Needs 67min visit to establish care with [facility].

## 2015-09-17 NOTE — Telephone Encounter (Signed)
Please advise refill.  Thanks 

## 2015-10-02 NOTE — Progress Notes (Signed)
Patient ID: [name], female   DOB: June 25, 1941, 75 y.o.   MRN: [id number] I have reviewed and agree with the above note. I agree the patient should follow up with me as scheduled as she declined immediate follow-up.

## 2015-10-29 ENCOUNTER — Telehealth: Payer: Self-pay

## 2015-10-29 NOTE — Telephone Encounter (Signed)
Phone call from pt.  Reported having "intermittent tightness, and pressure" in left neck.  Reported there is no pain associated with this.  Reported the symptoms occurred over the weekend, intermittently, and she wanted to make Dr. Scot Dock aware of this.  Denied any neurological deficits; denied temporary loss of vision, unilateral weakness, slurred speech, confusion, or imbalance.  Stated she has a follow-up in June, but wanted to check if needed to be evaluated any earlier.  Advised will discuss with Dr. Scot Dock tomorrow, and call pt. back.  Verb. Understanding.

## 2015-10-30 NOTE — Telephone Encounter (Signed)
Discussed pt's symptoms with Dr. Scot Dock; reviewed last carotid duplex of 04/2014.  Per Dr. Scot Dock, the symptoms pt. Reported would not be related to carotid disease; the follow up in June 2017 is appropriate.  Notified pt. Of Dr. Nicole Cella recommendation.  Advised if the tightness and pressure of left neck become persistent, she should f/u with PCP.  Verb. Understanding.

## 2015-11-21 ENCOUNTER — Other Ambulatory Visit: Payer: Self-pay | Admitting: Internal Medicine

## 2015-11-22 ENCOUNTER — Other Ambulatory Visit: Payer: Self-pay | Admitting: *Deleted

## 2015-11-22 ENCOUNTER — Telehealth: Payer: Self-pay

## 2015-11-22 MED ORDER — LORAZEPAM 0.5 MG PO TABS: ORAL_TABLET | ORAL | Status: DC

## 2015-11-22 NOTE — Telephone Encounter (Signed)
Ativan refill request.  Last filled 09/20/2015,  Last seen 01/01/2015.  Please advise.

## 2015-11-22 NOTE — Telephone Encounter (Signed)
Patient has requested a medication refill for lorazepam Pharmacy [facility]

## 2015-11-22 NOTE — Telephone Encounter (Signed)
Pt calling for refill of lorazepam;transferred pt to Janett Billow at [facility]; pt established care with Lorane Gell NP on 12/18/14 and Dr Gilford Rile refilled lorazepam on 09/20/15. Pt has CPX scheduled with Dr Caryl Bis 12/2015.

## 2015-11-22 NOTE — Telephone Encounter (Signed)
Refill given. Patient needs to keep her next appointment.

## 2015-11-22 NOTE — Telephone Encounter (Signed)
Please get the patient scheduled for an appointment for refill. Thanks.

## 2015-11-22 NOTE — Telephone Encounter (Signed)
Patient requesting refill on Lorazepam. Last fill 09/20/2015 with 0 refills- Last OV 01/01/2015-- CPE scheduled on 12/30/2015.

## 2015-12-13 DIAGNOSIS — H353132 Nonexudative age-related macular degeneration, bilateral, intermediate dry stage: Secondary | ICD-10-CM | POA: Diagnosis not present

## 2015-12-24 ENCOUNTER — Encounter: Payer: Self-pay | Admitting: Nurse Practitioner

## 2015-12-24 ENCOUNTER — Encounter: Payer: Self-pay | Admitting: Family Medicine

## 2015-12-30 ENCOUNTER — Ambulatory Visit (INDEPENDENT_AMBULATORY_CARE_PROVIDER_SITE_OTHER): Payer: Medicare Other | Admitting: Family Medicine

## 2015-12-30 ENCOUNTER — Encounter: Payer: Self-pay | Admitting: Family Medicine

## 2015-12-30 VITALS — BP 118/84 | HR 60 | Temp 97.8°F | Ht 66.0 in | Wt 119.4 lb

## 2015-12-30 DIAGNOSIS — R252 Cramp and spasm: Secondary | ICD-10-CM

## 2015-12-30 DIAGNOSIS — Z853 Personal history of malignant neoplasm of breast: Secondary | ICD-10-CM

## 2015-12-30 DIAGNOSIS — Z1239 Encounter for other screening for malignant neoplasm of breast: Secondary | ICD-10-CM | POA: Diagnosis not present

## 2015-12-30 DIAGNOSIS — R634 Abnormal weight loss: Secondary | ICD-10-CM

## 2015-12-30 DIAGNOSIS — R29898 Other symptoms and signs involving the musculoskeletal system: Secondary | ICD-10-CM

## 2015-12-30 DIAGNOSIS — F39 Unspecified mood [affective] disorder: Secondary | ICD-10-CM

## 2015-12-30 LAB — COMPREHENSIVE METABOLIC PANEL
ALT: 10 U/L (ref 0–35)
AST: 18 U/L (ref 0–37)
Albumin: 4.3 g/dL (ref 3.5–5.2)
Alkaline Phosphatase: 52 U/L (ref 39–117)
BUN: 17 mg/dL (ref 6–23)
CO2: 27 mEq/L (ref 19–32)
Calcium: 10.1 mg/dL (ref 8.4–10.5)
Chloride: 101 mEq/L (ref 96–112)
Creatinine, Ser: 0.75 mg/dL (ref 0.40–1.20)
GFR: 79.69 mL/min (ref >60.00)
Glucose, Bld: 91 mg/dL (ref 70–99)
Potassium: 5.4 mEq/L — ABNORMAL HIGH (ref 3.5–5.1)
Sodium: 136 mEq/L (ref 135–145)
Total Bilirubin: 0.6 mg/dL (ref 0.2–1.2)
Total Protein: 7.1 g/dL (ref 6.0–8.3)

## 2015-12-30 LAB — CBC
HCT: 37.9 % (ref 36.0–46.0)
Hemoglobin: 12.5 g/dL (ref 12.0–15.0)
MCHC: 33.1 g/dL (ref 30.0–36.0)
MCV: 109.9 fl — ABNORMAL HIGH (ref 78.0–100.0)
Platelets: 251 10*3/uL (ref 150.0–400.0)
RBC: 3.45 Mil/uL — ABNORMAL LOW (ref 3.87–5.11)
RDW: 13.5 % (ref 11.5–15.5)
WBC: 6 10*3/uL (ref 4.0–10.5)

## 2015-12-30 LAB — C-REACTIVE PROTEIN: CRP: 0.2 mg/dL — ABNORMAL LOW (ref 0.5–20.0)

## 2015-12-30 LAB — CK: Total CK: 33 U/L (ref 7–177)

## 2015-12-30 LAB — SEDIMENTATION RATE: Sed Rate: 7 mm/hr (ref 0–30)

## 2015-12-30 LAB — TSH: TSH: 1.18 u[IU]/mL (ref 0.35–4.50)

## 2015-12-30 MED ORDER — CLONAZEPAM 0.5 MG PO TABS: 0.2500 mg | ORAL_TABLET | Freq: Two times a day (BID) | ORAL | Status: DC | PRN

## 2015-12-30 NOTE — Patient Instructions (Addendum)
Nice to eat you. We are going to obtain some lab work to evaluate for a cause of your weight loss and cramps. We will also get a mammogram scheduled for you. Please do the exercises at home for your legs. We will change you to Klonopin for your anxiety. If you develop weakness, numbness, loss of bowel or bladder function, numbness between her legs, fevers, worsening depression or anxiety, thoughts of harming herself or others, or any new or changing symptoms please seek medical attention.  EXERCISES  RANGE OF MOTION (ROM) AND STRETCHING EXERCISES - Quadriceps Strain These exercises may help you when beginning to rehabilitate your injury. Your symptoms may resolve with or without further involvement from your physician, physical therapist or athletic trainer. While completing these exercises, remember:   Restoring tissue flexibility helps normal motion to return to the joints. This allows healthier, less painful movement and activity.  An effective stretch should be held for at least 30 seconds.  A stretch should never be painful. You should only feel a gentle lengthening or release in the stretched tissue. RANGE OF MOTION - Knee Flexion, Active  Lie on your back with both knees straight. (If this causes back discomfort, bend your opposite knee, placing your foot flat on the floor.)  Slowly slide your heel back toward your buttocks until you feel a gentle stretch in the front of your knee or thigh.  Hold for __________ seconds. Slowly slide your heel back to the starting position. Repeat __________ times. Complete this exercise __________ times per day.  STRETCH - Quadriceps, Prone  Lie on your stomach on a firm surface, such as a bed or padded floor.  Bend your right / left knee and grasp your ankle. If you are unable to reach, your ankle or pant leg, use a belt around your foot to lengthen your reach.  Gently pull your heel toward your buttocks. Your knee should not slide out to the  side. You should feel a stretch in the front of your thigh and/or knee.  Hold this position for __________ seconds. Repeat __________ times. Complete this stretch __________ times per day.  STRETCHING - Hip Flexors, Lunge  Half kneel with your right / left knee on the floor and your opposite knee bent and directly over your ankle.  Keep good posture with your head over your shoulders. Tighten your buttocks to point your tailbone downward; this will prevent your back from arching too much.  You should feel a gentle stretch in the front of your thigh and/or hip. If you do not feel any resistance, slightly slide your opposite foot forward and then slowly lunge forward so your knee once again lines up over your ankle. Be sure your tailbone remains pointed downward.  Hold this stretch for __________ seconds. Repeat __________ times. Complete this stretch __________ times per day. STRENGTHENING EXERCISES - Quadriceps Strain These exercises may help you when beginning to rehabilitate your injury. They may resolve your symptoms with or without further involvement from your physician, physical therapist or athletic trainer. While completing these exercises, remember:   Muscles can gain both the endurance and the strength needed for everyday activities through controlled exercises.  Complete these exercises as instructed by your physician, physical therapist or athletic trainer. Progress the resistance and repetitions only as guided. STRENGTH - Quadriceps, Isometrics  Lie on your back with your right / left leg extended and your opposite knee bent.  Gradually tense the muscles in the front of your right / left  thigh. You should see either your knee cap slide up toward your hip or increased dimpling just above the knee. This motion will push the back of the knee down toward the floor/mat/bed on which you are lying.  Hold the muscle as tight as you can without increasing your pain for __________  seconds.  Relax the muscles slowly and completely in between each repetition. Repeat __________ times. Complete this exercise __________ times per day.  STRENGTH - Quadriceps, Short Arcs   Lie on your back. Place a __________ inch towel roll under your knee so that the knee slightly bends.  Raise only your lower leg by tightening the muscles in the front of your thigh. Do not allow your thigh to rise.  Hold this position for __________ seconds. Repeat __________ times. Complete this exercise __________ times per day.  OPTIONAL ANKLE WEIGHTS: Begin with ____________________, but DO NOT exceed ____________________. Increase in1 lb/0.5 kg increments. STRENGTH - Quadriceps, Straight Leg Raises  Quality counts! Watch for signs that the quadriceps muscle is working to insure you are strengthening the correct muscles and not "cheating" by substituting with healthier muscles.  Lay on your back with your right / left leg extended and your opposite knee bent.  Tense the muscles in the front of your right / left thigh. You should see either your knee cap slide up or increased dimpling just above the knee. Your thigh may even quiver.  Tighten these muscles even more and raise your leg 4 to 6 inches off the floor. Hold for __________ seconds.  Keeping these muscles tense, lower your leg.  Relax the muscles slowly and completely in between each repetition. Repeat __________ times. Complete this exercise __________ times per day.  STRENGTH - Quadriceps, Wall Slides  Follow guidelines for form closely. Increased knee pain often results from poorly placed feet or knees.  Lean against a smooth wall or door and walk your feet out 18-24 inches. Place your feet hip-width apart.  Slowly slide down the wall or door until your knees bend __________ degrees.* Keep your knees over your heels, not your toes, and in line with your hips, not falling to either side.  Hold for __________ seconds. Stand up to rest  for __________ seconds in between each repetition. Repeat __________ times. Complete this exercise __________ times per day. * Your physician, physical therapist or athletic trainer will alter this angle based on your symptoms and progress. STRENGTH - Quadriceps, Step-Ups   Use a thick book, step or step stool that is __________ inches tall.  Holding a wall or counter for balance only, not support.  Slowly step-up with your right / left foot, keeping your knee in line with your hip and foot. Do not allow your knee to bend so far that you cannot see your toes.  Slowly unlock your knee and lower yourself to the starting position. Your muscles, not gravity, should lower you. Repeat __________ times. Complete this exercise __________ times per day.   This information is not intended to replace advice given to you by your health care provider. Make sure you discuss any questions you have with your health care provider.   Document Released: 07/06/2005 Document Revised: 11/20/2014 Document Reviewed: 10/18/2008 Elsevier Interactive Patient Education [employer].

## 2015-12-30 NOTE — Progress Notes (Signed)
Pre visit review using our clinic review tool, if applicable. No additional management support is needed unless otherwise documented below in the visit note. 

## 2015-12-30 NOTE — Progress Notes (Signed)
Patient ID: [name], female   DOB: 03/29/1941, 75 y.o.   MRN: [id number]  Dana Rumps, MD Phone: [phone]  [name] is a 75 y.o. female who presents today for follow-up.  Unintentional weight loss: Patient notes she has not been purposely losing weight. Is down 7 pounds over the last year and 6 pounds since February. She does report stress in her life with her husband having Alzheimer's and some housing issues. She does feel down sometimes. Some depression. Does have a history of breast cancer. Last mammogram was just over a year ago. Reports having had a colonoscopy in the last several years. Per review of chart it appears in 2010 she had a normal colonoscopy. She notes she had a total hysterectomy in the past for excessive bleeding though for noncancerous reasons.  Overall she states she does not have any energy. She does note a subjective weakness in her legs bilaterally. Notes going up stairs or an issue for her. [facility]. She feels her legs are not as strong as they should be. She does report some muscle cramps at night she's had for some time. Describes them as charley horses. Drinks plenty fluids. These resolve with getting up out of bed. She does report she has had mildly elevated potassium in the past. She does report occasionally feeling balance issues with this weakness. No back pain with this. No numbness between her legs. No loss of bowel or bladder function.  Anxiety: Patient notes she occasionally wakes up anxious at night and has to take lorazepam. Notes this helps only for about 3 hours. Occurs sporadically. A prescription usually lasts her about 2 months. She does feel this helps to does not last quite long enough. No SI.  Depression screen [facility] 2/9 12/30/2015 09/12/2015 12/18/2014  Decreased Interest 1 0 1  Down, Depressed, Hopeless 1 0 1  PHQ - 2 Score 2 0 2  Altered sleeping 2 - 0  Tired, decreased energy 3 - 0  Change in  appetite 2 - 0  Feeling bad or failure about yourself  1 - 0  Trouble concentrating 0 - 0  Moving slowly or fidgety/restless 0 - 0  Suicidal thoughts 0 - 0  PHQ-9 Score 10 - 2  Difficult doing work/chores Not difficult at all - Not difficult at all   GAD 7 : Generalized Anxiety Score 12/30/2015  Nervous, Anxious, on Edge 2  Control/stop worrying 2  Worry too much - different things 2  Trouble relaxing 1  Restless 0  Easily annoyed or irritable 1  Afraid - awful might happen 2  Total GAD 7 Score 10  Anxiety Difficulty Not difficult at all    PMH: former smoker  ROS  General:  Negative for nexplained weight loss, fever Skin: Negative for new or changing mole, sore that won't heal HEENT: Negative for trouble hearing, trouble seeing, ringing in ears, mouth sores, hoarseness, change in voice, dysphagia. CV:  Negative for chest pain, dyspnea, edema, palpitations Resp: Negative for cough, dyspnea, hemoptysis GI: Negative for nausea, vomiting, diarrhea, constipation, abdominal pain, melena, hematochezia. GU: Negative for dysuria, incontinence, urinary hesitance, hematuria, vaginal or penile discharge, polyuria, sexual difficulty, lumps in testicle or breasts MSK: Positive for muscle cramps or aches, negative for joint pain or swelling Neuro: Negative for headaches, weakness, numbness, dizziness, passing out/fainting Psych: Negative for depression, anxiety, memory problems  Objective  Physical Exam Filed Vitals:   12/30/15 1035  BP: 118/84  Pulse: 60  Temp: 97.8 F (36.6 C)    BP Readings from Last 3 Encounters:  12/30/15 118/84  09/12/15 122/76  01/01/15 118/68   Wt Readings from Last 3 Encounters:  12/30/15 119 lb 6.4 oz (54.159 kg)  09/12/15 125 lb 6.4 oz (56.881 kg)  01/01/15 126 lb 12.8 oz (57.516 kg)    Physical Exam  Constitutional: She is well-developed, well-nourished, and in no distress.  HENT:  Head: Normocephalic and atraumatic.  Right Ear: External ear  normal.  Left Ear: External ear normal.  Mouth/Throat: Oropharynx is clear and moist. No oropharyngeal exudate.  Eyes: Conjunctivae are normal. Pupils are equal, round, and reactive to light.  Cardiovascular: Normal rate, regular rhythm and normal heart sounds.   Pulmonary/Chest: Effort normal and breath sounds normal.  Abdominal: Soft. Bowel sounds are normal. She exhibits no distension. There is no tenderness. There is no rebound and no guarding.  Neurological: She is alert.  CN 2-12 intact, 5/5 strength in bilateral biceps, triceps, grip, quads, hamstrings, plantar and dorsiflexion, hip flexion, sensation to light touch intact in bilateral UE and LE, normal gait, 2+ patellar reflexes, negative Romberg  Skin: Skin is warm and dry. She is not diaphoretic.  Psychiatric: Mood and affect normal.     Assessment/Plan: Please see individual problem list.  Unintentional weight loss Patient reports unintentional weight loss over the last several months. She is not up-to-date on breast cancer screening. Mammogram is ordered. She is up-to-date on colonoscopy. No need for Pap smear given her report of hysterectomy for noncancerous reasons and given age. Could be related to anxiety and depression. Lab work as outlined below will be obtained to work this up further. Could consider chest x-ray if workup above is negative.  History of breast cancer Mammogram ordered.  Episodic mood disorder Patient with some depression and anxiety. These do not appear to be affecting her life per her report. Discussed that this could be affecting her weight loss. Offered daily medication such as SSRI though she declined this. We will change her to Klonopin to get a possible longer duration of antianxiety effect from her medication. She will stop the lorazepam. She will continue to monitor. She is given return precautions.  Leg weakness, bilateral Patient reports subjective bilateral leg weakness. She rises from seated  position in the room easily. She gets on the exam table easily. Is neurologically intact. No indication that this is coming from her back. No obvious cause on exam or abnormality on exam. We will check a CK to ensure this is normal. We will check lab work as outlined below. Does report occasional muscle cramps. We'll check electrolytes to work this up. She is given exercises to complete at home and offered physical therapy if she would like. She is given return precautions.    Orders Placed This Encounter  Procedures  . MM Digital Diagnostic Bilat    Standing Status: Future     Number of Occurrences:      Standing Expiration Date: 02/28/2017    Order Specific Question:  Reason for Exam (SYMPTOM  OR DIAGNOSIS REQUIRED)    Answer:  breast cnacer screening in patient with history of breast cancer    Order Specific Question:  Preferred imaging location?    Answer:  [facility]  . Comp Met (CMET)  . CBC  . Sed Rate (ESR)  . C-reactive protein  . TSH  . CK (Creatine Kinase)    Meds ordered this encounter  Medications  . clonazePAM (  KLONOPIN) 0.5 MG tablet    Sig: Take 0.5 tablets (0.25 mg total) by mouth 2 (two) times daily as needed for anxiety.    Dispense:  30 tablet    Refill:  [place], MD [place]

## 2015-12-30 NOTE — Assessment & Plan Note (Addendum)
Patient reports unintentional weight loss over the last several months. She is not up-to-date on breast cancer screening. Mammogram is ordered. She is up-to-date on colonoscopy. No need for Pap smear given her report of hysterectomy for noncancerous reasons and given age. Could be related to anxiety and depression. Lab work as outlined below will be obtained to work this up further. Could consider chest x-ray if workup above is negative.

## 2015-12-30 NOTE — Assessment & Plan Note (Signed)
Patient with some depression and anxiety. These do not appear to be affecting her life per her report. Discussed that this could be affecting her weight loss. Offered daily medication such as SSRI though she declined this. We will change her to Klonopin to get a possible longer duration of antianxiety effect from her medication. She will stop the lorazepam. She will continue to monitor. She is given return precautions.

## 2015-12-30 NOTE — Assessment & Plan Note (Signed)
Mammogram ordered

## 2015-12-30 NOTE — Assessment & Plan Note (Signed)
Patient reports subjective bilateral leg weakness. She rises from seated position in the room easily. She gets on the exam table easily. Is neurologically intact. No indication that this is coming from her back. No obvious cause on exam or abnormality on exam. We will check a CK to ensure this is normal. We will check lab work as outlined below. Does report occasional muscle cramps. We'll check electrolytes to work this up. She is given exercises to complete at home and offered physical therapy if she would like. She is given return precautions.

## 2016-01-08 ENCOUNTER — Encounter: Payer: Self-pay | Admitting: Family

## 2016-01-15 ENCOUNTER — Ambulatory Visit (INDEPENDENT_AMBULATORY_CARE_PROVIDER_SITE_OTHER): Payer: Medicare Other | Admitting: Family

## 2016-01-15 ENCOUNTER — Ambulatory Visit (HOSPITAL_COMMUNITY)
Admission: RE | Admit: 2016-01-15 | Discharge: 2016-01-15 | Disposition: A | Discharge status: Home / Self Care | Payer: Medicare Other | Source: Ambulatory Visit | Attending: Family | Admitting: Family

## 2016-01-15 ENCOUNTER — Encounter: Payer: Self-pay | Admitting: Family

## 2016-01-15 VITALS — BP 159/81 | HR 66 | Temp 97.4°F | Resp 18 | Ht 66.0 in | Wt 120.5 lb

## 2016-01-15 DIAGNOSIS — I10 Essential (primary) hypertension: Secondary | ICD-10-CM | POA: Insufficient documentation

## 2016-01-15 DIAGNOSIS — I6521 Occlusion and stenosis of right carotid artery: Secondary | ICD-10-CM | POA: Diagnosis not present

## 2016-01-15 DIAGNOSIS — Z4889 Encounter for other specified surgical aftercare: Secondary | ICD-10-CM | POA: Diagnosis not present

## 2016-01-15 DIAGNOSIS — Z9889 Other specified postprocedural states: Secondary | ICD-10-CM

## 2016-01-15 DIAGNOSIS — E785 Hyperlipidemia, unspecified: Secondary | ICD-10-CM | POA: Diagnosis not present

## 2016-01-15 DIAGNOSIS — I771 Stricture of artery: Secondary | ICD-10-CM

## 2016-01-15 DIAGNOSIS — I708 Atherosclerosis of other arteries: Secondary | ICD-10-CM | POA: Insufficient documentation

## 2016-01-15 DIAGNOSIS — I6523 Occlusion and stenosis of bilateral carotid arteries: Secondary | ICD-10-CM

## 2016-01-15 DIAGNOSIS — Z48812 Encounter for surgical aftercare following surgery on the circulatory system: Secondary | ICD-10-CM | POA: Diagnosis not present

## 2016-01-15 DIAGNOSIS — K219 Gastro-esophageal reflux disease without esophagitis: Secondary | ICD-10-CM | POA: Diagnosis not present

## 2016-01-15 DIAGNOSIS — I6522 Occlusion and stenosis of left carotid artery: Secondary | ICD-10-CM

## 2016-01-15 LAB — VAS US CAROTID
LEFT ECA DIAS: -6 cm/s
Left CCA dist dias: 19 cm/s
Left CCA dist sys: 88 cm/s
Left CCA prox dias: 15 cm/s
Left CCA prox sys: 86 cm/s
Left ICA dist dias: -21 cm/s
Left ICA dist sys: -78 cm/s
Left ICA prox dias: -15 cm/s
Left ICA prox sys: -68 cm/s
RIGHT CCA MID DIAS: 14 cm/s
RIGHT ECA DIAS: -12 cm/s
RIGHT VERTEBRAL DIAS: 21 cm/s
Right CCA prox dias: 16 cm/s
Right CCA prox sys: 100 cm/s
Right cca dist sys: -99 cm/s

## 2016-01-15 NOTE — Progress Notes (Signed)
Filed Vitals:   01/15/16 1151 01/15/16 1157  BP: 135/85 159/81  Pulse: 66   Temp: 97.4 F (36.3 C)   TempSrc: Oral   Resp: 18   Height: 5\' 6"  (1.676 m)   Weight: 120 lb 8 oz (54.658 kg)   SpO2: 100%

## 2016-01-15 NOTE — Patient Instructions (Signed)
Stroke Prevention Some medical conditions and behaviors are associated with an increased chance of having a stroke. You may prevent a stroke by making healthy choices and managing medical conditions. HOW CAN I REDUCE MY RISK OF HAVING A STROKE?   Stay physically active. Get at least 30 minutes of activity on most or all days.  Do not smoke. It may also be helpful to avoid exposure to secondhand smoke.  Limit alcohol use. Moderate alcohol use is considered to be:  No more than 2 drinks per day for men.  No more than 1 drink per day for nonpregnant women.  Eat healthy foods. This involves:  Eating 5 or more servings of fruits and vegetables a day.  Making dietary changes that address high blood pressure (hypertension), high cholesterol, diabetes, or obesity.  Manage your cholesterol levels.  Making food choices that are high in fiber and low in saturated fat, trans fat, and cholesterol may control cholesterol levels.  Take any prescribed medicines to control cholesterol as directed by your health care provider.  Manage your diabetes.  Controlling your carbohydrate and sugar intake is recommended to manage diabetes.  Take any prescribed medicines to control diabetes as directed by your health care provider.  Control your hypertension.  Making food choices that are low in salt (sodium), saturated fat, trans fat, and cholesterol is recommended to manage hypertension.  Ask your health care provider if you need treatment to lower your blood pressure. Take any prescribed medicines to control hypertension as directed by your health care provider.  If you are 18-39 years of age, have your blood pressure checked every 3-5 years. If you are 40 years of age or older, have your blood pressure checked every year.  Maintain a healthy weight.  Reducing calorie intake and making food choices that are low in sodium, saturated fat, trans fat, and cholesterol are recommended to manage  weight.  Stop drug abuse.  Avoid taking birth control pills.  Talk to your health care provider about the risks of taking birth control pills if you are over 35 years old, smoke, get migraines, or have ever had a blood clot.  Get evaluated for sleep disorders (sleep apnea).  Talk to your health care provider about getting a sleep evaluation if you snore a lot or have excessive sleepiness.  Take medicines only as directed by your health care provider.  For some people, aspirin or blood thinners (anticoagulants) are helpful in reducing the risk of forming abnormal blood clots that can lead to stroke. If you have the irregular heart rhythm of atrial fibrillation, you should be on a blood thinner unless there is a good reason you cannot take them.  Understand all your medicine instructions.  Make sure that other conditions (such as anemia or atherosclerosis) are addressed. SEEK IMMEDIATE MEDICAL CARE IF:   You have sudden weakness or numbness of the face, arm, or leg, especially on one side of the body.  Your face or eyelid droops to one side.  You have sudden confusion.  You have trouble speaking (aphasia) or understanding.  You have sudden trouble seeing in one or both eyes.  You have sudden trouble walking.  You have dizziness.  You have a loss of balance or coordination.  You have a sudden, severe headache with no known cause.  You have new chest pain or an irregular heartbeat. Any of these symptoms may represent a serious problem that is an emergency. Do not wait to see if the symptoms will   go away. Get medical help at once. Call your local emergency services (911 in [place] Do not drive yourself to the hospital.   This information is not intended to replace advice given to you by your health care provider. Make sure you discuss any questions you have with your health care provider.   Document Released: 08/13/2004 Document Revised: 07/27/2014 Document Reviewed:  01/06/2013 Elsevier Interactive Patient Education 2016 [employer]  

## 2016-01-15 NOTE — Progress Notes (Signed)
Chief Complaint: Follow up Extracranial Carotid Artery Stenosis   History of Present Illness  [name] is a 75 y.o. female patient of Dr. Scot Dock who is s/p left carotid endarterectomy in April of 2006. She comes in for an 18 month durable duplex. She denies any history of stroke, TIAs, expressive or receptive aphasia, or amaurosis fugax. She continues to teach water coloring. She has a history of hypertension and hyperlipidemia which had been well controlled on her current medications. She is the caregiver for her husband with Alzheimer's Disease.    She reports a mild "achey" intermittent sensation in her right forearm, denies steal sx's in left UE.   She had a left breast lumpectomy in 2001 for breast cancer, denies excision of any lymph nodes.   The patient denies New Medical or Surgical History.  Pt Diabetic: no Pt smoker: former smoker, quit about 2002, started at age 67 yrs  Pt meds include: Statin : no ASA: yes Other anticoagulants/antiplatelets: no    Past Medical History  Diagnosis Date  . Osteoarthrosis involving, or with mention of more than one site, but not specified as generalized, multiple sites   . Cancer (HCC)     breast  . GERD (gastroesophageal reflux disease)   . Hyperlipidemia   . Hypertension   . Osteopenia   . Osteoarthritis, multiple sites   . Psoriasis     Social History Social History  Substance Use Topics  . Smoking status: Former Smoker -- 1.00 packs/day for 40 years    Types: Cigarettes    Quit date: 07/21/1995  . Smokeless tobacco: Never Used  . Alcohol Use: 9.6 oz/week    2 Glasses of wine, 14 Standard drinks or equivalent per week     Comment: daily    Family History Family History  Problem Relation Age of Onset  . Hypertension Brother   . Heart disease Neg Hx   . Diabetes Neg Hx   . Pneumonia Mother   . Leukemia Father     Surgical History Past Surgical History  Procedure Laterality Date  . Total hip  arthroplasty  2004    right  . Breast lumpectomy  2001    left  . Shoulder surgery  6/07    left  . Abdominal hysterectomy    . Ankle fracture surgery  4/08    left---hardware still in place  . Fracture surgery    . Carotid endarterectomy Left 10/23/2004  . Tonsillectomy and adenoidectomy      Allergies  Allergen Reactions  . Ibuprofen Nausea Only  . Wellbutrin [Bupropion] Other (See Comments)    problems sleeping and nightmares    Current Outpatient Prescriptions  Medication Sig Dispense Refill  . aspirin 81 MG tablet Take 81 mg by mouth daily.    . Cholecalciferol (VITAMIN D PO) Take by mouth 2 (two) times daily.     . clonazePAM (KLONOPIN) 0.5 MG tablet Take 0.5 tablets (0.25 mg total) by mouth 2 (two) times daily as needed for anxiety. 30 tablet 1  . metoprolol (LOPRESSOR) 50 MG tablet TAKE 1 TABLET TWICE A DAY (Patient taking differently: takes 25 mg twice daily) 180 tablet 1  . Multiple Vitamin (MULTIVITAMIN) capsule Take 1 capsule by mouth daily.      . Multiple Vitamins-Minerals (PRESERVISION AREDS 2) CAPS Take 1 tablet by mouth 2 (two) times daily.     No current facility-administered medications for this visit.    Review of Systems : See HPI for pertinent  positives and negatives.  Physical Examination  Filed Vitals:   01/15/16 1151 01/15/16 1157  BP: 135/85 159/81  Pulse: 66   Temp: 97.4 F (36.3 C)   TempSrc: Oral   Resp: 18   Height: 5\' 6"  (1.676 m)   Weight: 120 lb 8 oz (54.658 kg)   SpO2: 100%    Body mass index is 19.46 kg/(m^2).  General: WDWN female in NAD GAIT: normal Eyes: PERRLA Pulmonary:  Respirations are non-labored, CTAB  Cardiac: regular rhythm, no detected murmur.  VASCULAR EXAM Carotid Bruits Right Left   Negative Negative    Aorta is not palpable. Radial pulses are 2+ palpable and equal.                                                                                                                            LE Pulses Right  Left       POPLITEAL  not palpable   not palpable       POSTERIOR TIBIAL   palpable    palpable        DORSALIS PEDIS      ANTERIOR TIBIAL  palpable   palpable     Gastrointestinal: soft, nontender, BS WNL, no r/g,  no palpable masses.  Musculoskeletal: no muscle atrophy/wasting. M/S 5/5 throughout except 4/5 left LE, extremities without ischemic changes. Muliple small varicosites in legs.  Neurologic: A&O X 3; Appropriate Affect, Speech is normal CN 2-12 intact, pain and light touch intact in extremities, Motor exam as listed above.   Non-Invasive Vascular Imaging CAROTID DUPLEX 01/15/2016   CEREBROVASCULAR DUPLEX EVALUATION    INDICATION: Carotid endarterectomy     PREVIOUS INTERVENTION(S): Left carotid endarterectomy on 10/19/04    DUPLEX EXAM:     RIGHT  LEFT  Peak Systolic Velocities (cm/s) End Diastolic Velocities (cm/s) Plaque LOCATION Peak Systolic Velocities (cm/s) End Diastolic Velocities (cm/s) Plaque  100 16  CCA PROXIMAL 87 15   66 14  CCA MID 75 19   74 19  CCA DISTAL 88 19   80 12  ECA 72 6   64 16 HT ICA PROXIMAL 68 15   78 23  ICA MID 123 37   99 23  ICA DISTAL 78 21      ICA / CCA Ratio (PSV)   Antegrade Vertebral Flow Antegrade  123XX123 Brachial Systolic Pressure (mmHg) XX123456  Triphasic Subclavian Artery Waveforms Monophasic    Plaque Morphology:  HM = Homogeneous, HT = Heterogeneous, CP = Calcific Plaque, SP = Smooth Plaque, IP = Irregular Plaque     ADDITIONAL FINDINGS: . Monophasic left proximal subclavian artery flow with a proximal peak systolic velocity of A999333, dampened biphasic left mid subclavian artery flow and a 54mm/Mg difference of the bilateral brachial pressures.    IMPRESSION: 1.  1-39% right internal carotid artery stenosis 2. Patent left carotid endarterectomy site with no left internal carotid artery stenosis. 3. Proximal left subclavian artery stenosis.  Compared to the previous exam:  No significant change noted when  compared to the previous exam on 04-25-14.      Assessment: [name] is a 75 y.o. female who is s/p left carotid endarterectomy in April of 2006.  She denies any history of stroke, TIAs, expressive or receptive aphasia, or amaurosis fugax.   She reports a mild "achey" intermittent sensation in her right forearm, denies steal sx's in left UE.   Today's carotid duplex suggests 1-39% right internal carotid artery stenosis Patent left carotid endarterectomy site with no left internal carotid artery stenosis. Proximal left subclavian artery stenosis. No significant change noted when compared to the previous exam on 04-25-14.    Plan: Follow-up in 2 years with Carotid Duplex scan.   I discussed in depth with the patient the nature of atherosclerosis, and emphasized the importance of maximal medical management including strict control of blood pressure, blood glucose, and lipid levels, obtaining regular exercise, and continued cessation of smoking.  The patient is aware that without maximal medical management the underlying atherosclerotic disease process will progress, limiting the benefit of any interventions. The patient was given information about stroke prevention and what symptoms should prompt the patient to seek immediate medical care. Thank you for allowing [place] to participate in this patient's care.  Clemon Chambers, RN, MSN, FNP-C [facility] Office: [phone]  Clinic Physician: Scot Dock  01/15/2016 12:11 PM

## 2016-01-30 ENCOUNTER — Ambulatory Visit: Payer: Self-pay | Admitting: Family Medicine

## 2016-02-07 ENCOUNTER — Other Ambulatory Visit: Payer: Self-pay | Admitting: Family Medicine

## 2016-02-07 NOTE — Telephone Encounter (Signed)
Refill given

## 2016-02-25 ENCOUNTER — Encounter: Payer: Self-pay | Admitting: Family Medicine

## 2016-02-25 ENCOUNTER — Ambulatory Visit (INDEPENDENT_AMBULATORY_CARE_PROVIDER_SITE_OTHER): Payer: Medicare Other | Admitting: Family Medicine

## 2016-02-25 VITALS — BP 112/66 | HR 65 | Temp 98.0°F | Wt 118.4 lb

## 2016-02-25 DIAGNOSIS — I6522 Occlusion and stenosis of left carotid artery: Secondary | ICD-10-CM | POA: Diagnosis not present

## 2016-02-25 DIAGNOSIS — I10 Essential (primary) hypertension: Secondary | ICD-10-CM | POA: Diagnosis not present

## 2016-02-25 DIAGNOSIS — F39 Unspecified mood [affective] disorder: Secondary | ICD-10-CM | POA: Diagnosis not present

## 2016-02-25 DIAGNOSIS — R634 Abnormal weight loss: Secondary | ICD-10-CM | POA: Diagnosis not present

## 2016-02-25 DIAGNOSIS — E875 Hyperkalemia: Secondary | ICD-10-CM

## 2016-02-25 LAB — BASIC METABOLIC PANEL
BUN: 15 mg/dL (ref 6–23)
CO2: 28 mEq/L (ref 19–32)
Calcium: 10 mg/dL (ref 8.4–10.5)
Chloride: 100 mEq/L (ref 96–112)
Creatinine, Ser: 0.69 mg/dL (ref 0.40–1.20)
GFR: 87.7 mL/min (ref >60.00)
Glucose, Bld: 86 mg/dL (ref 70–99)
Potassium: 5 mEq/L (ref 3.5–5.1)
Sodium: 138 mEq/L (ref 135–145)

## 2016-02-25 LAB — CBC WITH DIFFERENTIAL/PLATELET
Basophils Absolute: 0.1 10*3/uL (ref 0.0–0.1)
Basophils Relative: 1 % (ref 0.0–3.0)
Eosinophils Absolute: 0.1 10*3/uL (ref 0.0–0.7)
Eosinophils Relative: 1.5 % (ref 0.0–5.0)
HCT: 37.2 % (ref 36.0–46.0)
Hemoglobin: 12.5 g/dL (ref 12.0–15.0)
Lymphocytes Relative: 28.5 % (ref 12.0–46.0)
Lymphs Abs: 1.7 10*3/uL (ref 0.7–4.0)
MCHC: 33.7 g/dL (ref 30.0–36.0)
MCV: 109.4 fl — ABNORMAL HIGH (ref 78.0–100.0)
Monocytes Absolute: 0.8 10*3/uL (ref 0.1–1.0)
Monocytes Relative: 12.9 % — ABNORMAL HIGH (ref 3.0–12.0)
Neutro Abs: 3.3 10*3/uL (ref 1.4–7.7)
Neutrophils Relative %: 56.1 % (ref 43.0–77.0)
Platelets: 274 10*3/uL (ref 150.0–400.0)
RBC: 3.4 Mil/uL — ABNORMAL LOW (ref 3.87–5.11)
RDW: 13 % (ref 11.5–15.5)
WBC: 5.9 10*3/uL (ref 4.0–10.5)

## 2016-02-25 MED ORDER — LORAZEPAM 0.5 MG PO TABS: 0.5000 mg | ORAL_TABLET | Freq: Two times a day (BID) | ORAL | 0 refills | Status: DC | PRN

## 2016-02-25 MED ORDER — SERTRALINE HCL 50 MG PO TABS: 25.0000 mg | ORAL_TABLET | Freq: Every day | ORAL | 3 refills | Status: DC

## 2016-02-25 NOTE — Assessment & Plan Note (Signed)
Suspect this is related to depression and anxiety. Lab work overall was reassuring. She will continue to monitor as her weight has stabilized. We will treat her depression and anxiety.

## 2016-02-25 NOTE — Progress Notes (Signed)
Pre visit review using our clinic review tool, if applicable. No additional management support is needed unless otherwise documented below in the visit note. 

## 2016-02-25 NOTE — Progress Notes (Signed)
  [name], MD Phone: [phone]  [name] is a 75 y.o. female who presents today for follow-up.  Anxiety/depression: Patient notes this is worsened recently. Her husband has dementia, CHF, and A. fib. Notes they've done everything for him that they can and she has been told that he is going to go sooner rather than later. She's taking Ativan every other day. Notes this helps quickly.. No thoughts of harming herself. Notes not having much of an appetite and not having any interest in eating. Notes her weight has been stable since last checked.   HYPERTENSION  Disease Monitoring  Home BP Monitoring not checking Chest pain- no    Dyspnea- no Medications  Compliance-  taking metoprolol.  Edema- no  Hyperkalemia: Potassium noted to be minimally elevated persistently over the last several checks. She is not on any medications that can necessarily cause this. Does take Advil. No history of kidney issues. No palpitations.   PMH: Former smoker.   ROS see history of present illness  Objective  Physical Exam Vitals:   02/25/16 0841  BP: 112/66  Pulse: 65  Temp: 98 F (36.7 C)    BP Readings from Last 3 Encounters:  02/25/16 112/66  01/15/16 (!) 159/81  12/30/15 118/84   Wt Readings from Last 3 Encounters:  02/25/16 118 lb 6.4 oz (53.7 kg)  01/15/16 120 lb 8 oz (54.7 kg)  12/30/15 119 lb 6.4 oz (54.2 kg)    Physical Exam  Constitutional: No distress.  HENT:  Head: Normocephalic and atraumatic.  Mouth/Throat: Oropharynx is clear and moist. No oropharyngeal exudate.  Eyes: Conjunctivae are normal. Pupils are equal, round, and reactive to light.  Cardiovascular: Normal rate, regular rhythm and normal heart sounds.   Pulmonary/Chest: Effort normal and breath sounds normal.  Neurological: She is alert. Gait normal.  Skin: Skin is warm and dry. She is not diaphoretic.  Psychiatric:  Mood depressed, affect depressed     Assessment/Plan: Please see individual  problem list.  Unintentional weight loss Suspect this is related to depression and anxiety. Lab work overall was reassuring. She will continue to monitor as her weight has stabilized. We will treat her depression and anxiety.  Hyperkalemia Continued to have mildly elevated potassium. We will recheck this today and check an aldosterone and renal in level.  Episodic mood disorder Worsening anxiety and depression. Patient is open to starting an SSRI at this time. We will start on Zoloft. She'll continue Ativan as needed. Given return precautions.  Hypertension At goal. Continue metoprolol.   Orders Placed This Encounter  Procedures  . Basic Metabolic Panel (BMET)  . CBC w/Diff  . Aldosterone + renin activity w/ ratio    Meds ordered this encounter  Medications  . sertraline (ZOLOFT) 50 MG tablet    Sig: Take 0.5 tablets (25 mg total) by mouth daily.    Dispense:  30 tablet    Refill:  3  . LORazepam (ATIVAN) 0.5 MG tablet    Sig: Take 1 tablet (0.5 mg total) by mouth 2 (two) times daily as needed for anxiety.    Dispense:  60 tablet    Refill:  0    Not to exceed 5 additional fills before 05/20/2016    [name], MD [place]

## 2016-02-25 NOTE — Patient Instructions (Addendum)
Nice to see you. We'll start you on Zoloft for your anxiety. You can continue the lorazepam. We will recheck your potassium and another lab test to evaluate for a cause of your elevated potassium. If you develop thoughts of harming herself or worsening anxiety or depression please seek medical attention.

## 2016-02-25 NOTE — Assessment & Plan Note (Signed)
Continued to have mildly elevated potassium. We will recheck this today and check an aldosterone and renal in level.

## 2016-02-25 NOTE — Assessment & Plan Note (Signed)
Worsening anxiety and depression. Patient is open to starting an SSRI at this time. We will start on Zoloft. She'll continue Ativan as needed. Given return precautions.

## 2016-02-25 NOTE — Assessment & Plan Note (Signed)
At goal. Continue metoprolol. 

## 2016-02-26 ENCOUNTER — Encounter: Payer: Self-pay | Admitting: Family Medicine

## 2016-03-01 LAB — ALDOSTERONE + RENIN ACTIVITY W/ RATIO
ALDO / PRA Ratio: 18.9 Ratio (ref 0.9–28.9)
Aldosterone: 7 ng/dL
PRA LC/MS/MS: 0.37 ng/mL/h (ref 0.25–5.82)

## 2016-03-27 ENCOUNTER — Other Ambulatory Visit: Payer: Self-pay | Admitting: Family Medicine

## 2016-03-30 NOTE — Telephone Encounter (Signed)
It appears per my last note that the patient was taking Ativan. Please determine if she is taking Ativan or Klonopin. Thanks.

## 2016-03-30 NOTE — Telephone Encounter (Signed)
Can we refill this? 

## 2016-03-31 NOTE — Telephone Encounter (Signed)
It is not safe for her to be taking 2 different benzodiazepines. This places her at risk for confusion and falls. I will not refill her Klonopin. If she needs something for sleep she should follow-up in the office for a visit to discuss this.

## 2016-03-31 NOTE — Telephone Encounter (Signed)
Spoke with patient and she stated that she is taking the Klonopin about twice a week to help her sleep.

## 2016-04-01 NOTE — Telephone Encounter (Signed)
LM for patient to return call.

## 2016-04-28 ENCOUNTER — Ambulatory Visit: Payer: Self-pay | Admitting: Family Medicine

## 2016-05-01 ENCOUNTER — Ambulatory Visit (INDEPENDENT_AMBULATORY_CARE_PROVIDER_SITE_OTHER): Payer: Medicare Other | Admitting: Family Medicine

## 2016-05-01 ENCOUNTER — Encounter (INDEPENDENT_AMBULATORY_CARE_PROVIDER_SITE_OTHER): Payer: Self-pay

## 2016-05-01 ENCOUNTER — Encounter: Payer: Self-pay | Admitting: Family Medicine

## 2016-05-01 DIAGNOSIS — F39 Unspecified mood [affective] disorder: Secondary | ICD-10-CM | POA: Diagnosis not present

## 2016-05-01 DIAGNOSIS — W19XXXA Unspecified fall, initial encounter: Secondary | ICD-10-CM | POA: Insufficient documentation

## 2016-05-01 DIAGNOSIS — I6522 Occlusion and stenosis of left carotid artery: Secondary | ICD-10-CM

## 2016-05-01 MED ORDER — SERTRALINE HCL 50 MG PO TABS: ORAL_TABLET | ORAL | 3 refills | Status: DC

## 2016-05-01 NOTE — Progress Notes (Signed)
Pre visit review using our clinic review tool, if applicable. No additional management support is needed unless otherwise documented below in the visit note. 

## 2016-05-01 NOTE — Assessment & Plan Note (Signed)
Stable since previous visit. She is on a very small dose of Zoloft. We will increase 50 mg daily for 2 weeks and then increase to 100 mg daily. I offered therapy referral that she declined. Should continue to monitor. She's given return precautions.

## 2016-05-01 NOTE — Assessment & Plan Note (Signed)
Patient with likely mechanical fall over a flower pot. Appears to have recovered well. Asymptomatic at this time. No loss of consciousness. Neurologically intact. I discussed fall prevention strategies. I gave her return precautions.

## 2016-05-01 NOTE — Patient Instructions (Signed)
Nice to see you. We are going to increase her Zoloft. Please follow the directions as outlined on the prescription. If you develop thoughts of harming yourself, headaches, vision changes, or any new or changing symptoms please seek medical attention.

## 2016-05-01 NOTE — Progress Notes (Signed)
  [name], MD Phone: [phone]  [name] is a 75 y.o. female who presents today for follow-up.  Anxiety and depression: Patient notes this is about the same. She's been on 25 mg of Zoloft for the last 2 months. Taking Ativan 3 times a week. Notes her husband has Alzheimer's and her son was in a fairly significant accident recently with fractures to his arm neck and back. She notes dealing with all this has been difficult. No SI.   She notes she did fall 5-6 weeks ago. She tripped over a flower pot. She fell forward and landed on her face. She had 2 black eyes. She also had some dental damage for which she is already seen dentistry. She's not had any headaches or vision changes since then. No loss of consciousness with this. She's asymptomatic at this time.  PMH: Former smoker   ROS see history of present illness  Objective  Physical Exam Vitals:   05/01/16 1107  BP: 136/88  Pulse: 66  Temp: 97.9 F (36.6 C)    BP Readings from Last 3 Encounters:  05/01/16 136/88  02/25/16 112/66  01/15/16 (!) 159/81   Wt Readings from Last 3 Encounters:  05/01/16 116 lb (52.6 kg)  02/25/16 118 lb 6.4 oz (53.7 kg)  01/15/16 120 lb 8 oz (54.7 kg)    Physical Exam  Constitutional: She is well-developed, well-nourished, and in no distress.  HENT:  Head: Normocephalic and atraumatic.  Mouth/Throat: Oropharynx is clear and moist. No oropharyngeal exudate.  No bony tenderness of the forehead, or bony structures of the face  Eyes: Conjunctivae are normal. Pupils are equal, round, and reactive to light.  Cardiovascular: Normal rate, regular rhythm and normal heart sounds.   Pulmonary/Chest: Effort normal and breath sounds normal.  Musculoskeletal: She exhibits no edema.  Neurological: She is alert.  CN 2-12 intact, 5/5 strength in bilateral biceps, triceps, grip, quads, hamstrings, plantar and dorsiflexion, sensation to light touch intact in bilateral UE and LE, normal gait,  2+ patellar reflexes  Psychiatric:  Mood anxious, affect anxious     Assessment/Plan: Please see individual problem list.  Episodic mood disorder Stable since previous visit. She is on a very small dose of Zoloft. We will increase 50 mg daily for 2 weeks and then increase to 100 mg daily. I offered therapy referral that she declined. Should continue to monitor. She's given return precautions.  Fall Patient with likely mechanical fall over a flower pot. Appears to have recovered well. Asymptomatic at this time. No loss of consciousness. Neurologically intact. I discussed fall prevention strategies. I gave her return precautions.   No orders of the defined types were placed in this encounter.   Meds ordered this encounter  Medications  . sertraline (ZOLOFT) 50 MG tablet    Sig: Take 50 mg (one tablet) daily for 2 weeks, then increase to 100 mg (2 tablets) daily    Dispense:  60 tablet    Refill:  [place], MD [place]

## 2016-05-04 DIAGNOSIS — Z23 Encounter for immunization: Secondary | ICD-10-CM | POA: Diagnosis not present

## 2016-05-18 ENCOUNTER — Telehealth: Payer: Self-pay | Admitting: Family Medicine

## 2016-05-18 ENCOUNTER — Encounter: Payer: Self-pay | Admitting: Family Medicine

## 2016-05-18 ENCOUNTER — Ambulatory Visit (INDEPENDENT_AMBULATORY_CARE_PROVIDER_SITE_OTHER): Payer: Medicare Other | Admitting: Family Medicine

## 2016-05-18 DIAGNOSIS — I6522 Occlusion and stenosis of left carotid artery: Secondary | ICD-10-CM

## 2016-05-18 DIAGNOSIS — R634 Abnormal weight loss: Secondary | ICD-10-CM | POA: Diagnosis not present

## 2016-05-18 DIAGNOSIS — R197 Diarrhea, unspecified: Secondary | ICD-10-CM | POA: Diagnosis not present

## 2016-05-18 MED ORDER — CITALOPRAM HYDROBROMIDE 20 MG PO TABS: 20.0000 mg | ORAL_TABLET | Freq: Every day | ORAL | 3 refills | Status: DC

## 2016-05-18 NOTE — Patient Instructions (Signed)
Nice to see you. We're going to taper you off Zoloft. Please decrease back down to 50 mg for 3 days and then down to 25 mg for one week. You may then start on the Celexa. Please monitor your diarrhea and if this does not improve with this change please let us know. If you develop abdominal pain, blood in your stool, fever, or any new or changing symptoms please seek medical attention.

## 2016-05-18 NOTE — Assessment & Plan Note (Signed)
Patient with several weeks of diarrhea. This coincided with increasing her dose of Zoloft and on review it appears that 20% of patients can get diarrhea with this medication. She has a benign abdominal exam. No nausea, vomiting, or fevers to indicate an infectious cause. Suspect related to her Zoloft. We will taper her down off of this. We will then start her on Celexa which has less of a chance to cause diarrhea. She will continue Imodium. If she develops any new symptoms she'll let us know. She is given return precautions.

## 2016-05-18 NOTE — Telephone Encounter (Signed)
Pt called and stated that she has had diarrhea for 3 weeks. Can Dr. Caryl Bis can prescrbe thing or does she need to come in for an appt. Please advise, thank you !  Call pt @ [phone]

## 2016-05-18 NOTE — Assessment & Plan Note (Addendum)
Weight remains relatively stable. Prior lab work unremarkable. It appears that she is up-to-date on colonoscopy and mammogram. Status post hysterectomy. Suspect related to anxiety and depression. We will treat with Celexa after tapering off of Zoloft for this. She'll continue to monitor her weight.

## 2016-05-18 NOTE — Progress Notes (Signed)
Pre visit review using our clinic review tool, if applicable. No additional management support is needed unless otherwise documented below in the visit note. 

## 2016-05-18 NOTE — Progress Notes (Signed)
  [name], MD Phone: [phone]  [name] is a 75 y.o. female who presents today for follow-up.  Patient notes for the last 3 weeks she's had intermittent diarrhea. Notes sometimes stool is loose and sometimes it is liquidy. Notes other times it is soft. Notes some cramps with this though no abdominal pain. Notes her stomach has been noisy as well. No vomiting. No nausea. No fevers. Just doesn't feel like eating at times. Does not like to cook or eat food very much. Does get full with a little amount of food though this hasn't worsened very much over the years. Weight appears to have been relatively stable recently. She has been using Imodium and reflux pill over-the-counter for this. Of note right before she started to have the symptoms she increase her Zoloft dose from 25 to 50 mg. Currently taking 100 mg for the last 3 days. She is drinking plenty of fluids.  PMH: Former smoker   ROS see history of present illness  Objective  Physical Exam Vitals:   05/18/16 0945  BP: 138/82  Pulse: 66  Temp: 98.2 F (36.8 C)    BP Readings from Last 3 Encounters:  05/18/16 138/82  05/01/16 136/88  02/25/16 112/66   Wt Readings from Last 3 Encounters:  05/18/16 116 lb 12.8 oz (53 kg)  05/01/16 116 lb (52.6 kg)  02/25/16 118 lb 6.4 oz (53.7 kg)    Physical Exam  Constitutional: No distress.  Cardiovascular: Normal rate, regular rhythm and normal heart sounds.   Pulmonary/Chest: Effort normal and breath sounds normal.  Abdominal: Soft. Bowel sounds are normal. She exhibits no distension. There is no tenderness. There is no rebound and no guarding.  Musculoskeletal: She exhibits no edema.  Neurological: She is alert. Gait normal.  Skin: Skin is warm and dry. She is not diaphoretic.     Assessment/Plan: Please see individual problem list.  Diarrhea Patient with several weeks of diarrhea. This coincided with increasing her dose of Zoloft and on review it appears that  20% of patients can get diarrhea with this medication. She has a benign abdominal exam. No nausea, vomiting, or fevers to indicate an infectious cause. Suspect related to her Zoloft. We will taper her down off of this. We will then start her on Celexa which has less of a chance to cause diarrhea. She will continue Imodium. If she develops any new symptoms she'll let us know. She is given return precautions.  Unintentional weight loss Weight remains relatively stable. Prior lab work unremarkable. It appears that she is up-to-date on colonoscopy and mammogram. Status post hysterectomy. Suspect related to anxiety and depression. We will treat with Celexa after tapering off of Zoloft for this. She'll continue to monitor her weight.   No orders of the defined types were placed in this encounter.   Meds ordered this encounter  Medications  . citalopram (CELEXA) 20 MG tablet    Sig: Take 1 tablet (20 mg total) by mouth daily.    Dispense:  30 tablet    Refill:  [place], MD [place]

## 2016-05-18 NOTE — Telephone Encounter (Signed)
Scheduled patient to come in for appointment today at 9:45 AM

## 2016-06-01 ENCOUNTER — Telehealth: Payer: Self-pay | Admitting: *Deleted

## 2016-06-01 DIAGNOSIS — R197 Diarrhea, unspecified: Secondary | ICD-10-CM

## 2016-06-01 NOTE — Telephone Encounter (Signed)
LM for patient to return call and get more information.

## 2016-06-01 NOTE — Telephone Encounter (Signed)
Please call pt after 4pm  Contact [phone]

## 2016-06-01 NOTE — Telephone Encounter (Signed)
Patient requested a call in reference to her diarrhea, she stated that Dr. Caryl Bis was aware of this. She also stated that her OTC imodium AD is no longer working Pt contact [phone]

## 2016-06-01 NOTE — Telephone Encounter (Signed)
Please attempt to call patient and gather more details.

## 2016-06-02 NOTE — Telephone Encounter (Signed)
Spoke with patient and she is still having the diarrhea in the AM that has been going on for about 6 weeks. She has been taking the imodium OTC with out any relief. She is not having any normal bowel movements during the day, no vomiting, fever, bloating or chills. She is having some stomach cramps. Patient stated that she has been taking nexium and this seems to be helping.  She stated that she has had this in the past and was given lomotil in the past and it helped. She is wanting to see if you can send her in something for this.

## 2016-06-02 NOTE — Telephone Encounter (Signed)
Pt called back looking for an update. Thank you!  Call pt @ [phone]

## 2016-06-03 NOTE — Telephone Encounter (Signed)
Given persistent symptoms I would like for her to do stool studies prior to prescribing Lomotil. If the stool studies are negative we can try Lomotil. Thanks.

## 2016-06-03 NOTE — Telephone Encounter (Signed)
Left message to call back. Once pt is notified, please have Dr. Caryl Bis place order for the type of stool study he wants to order.

## 2016-06-03 NOTE — Telephone Encounter (Signed)
Patient will come by tomorrow morning to pick up stool studies. Please place order

## 2016-06-03 NOTE — Telephone Encounter (Signed)
Pt called returning call. Thank you!  Call pt [phone]

## 2016-06-03 NOTE — Telephone Encounter (Signed)
Orders placed.

## 2016-06-05 ENCOUNTER — Other Ambulatory Visit: Payer: Medicare Other

## 2016-06-05 DIAGNOSIS — R197 Diarrhea, unspecified: Secondary | ICD-10-CM | POA: Diagnosis not present

## 2016-06-06 LAB — CLOSTRIDIUM DIFFICILE BY PCR: Toxigenic C. Difficile by PCR: NOT DETECTED

## 2016-06-09 LAB — GASTROINTESTINAL PATHOGEN PANEL PCR
C. difficile Tox A/B, PCR: NOT DETECTED
Campylobacter, PCR: NOT DETECTED
Cryptosporidium, PCR: NOT DETECTED
E coli (ETEC) LT/ST PCR: NOT DETECTED
E coli (STEC) stx1/stx2, PCR: NOT DETECTED
E coli 0157, PCR: NOT DETECTED
Giardia lamblia, PCR: NOT DETECTED
Norovirus, PCR: NOT DETECTED
Rotavirus A, PCR: NOT DETECTED
Salmonella, PCR: NOT DETECTED
Shigella, PCR: NOT DETECTED

## 2016-06-10 ENCOUNTER — Other Ambulatory Visit: Payer: Self-pay | Admitting: Family Medicine

## 2016-06-10 NOTE — Telephone Encounter (Signed)
Refill given. Please fax. 

## 2016-06-10 NOTE — Telephone Encounter (Signed)
RX faxed

## 2016-06-10 NOTE — Telephone Encounter (Signed)
Please advise 

## 2016-06-16 ENCOUNTER — Telehealth: Payer: Self-pay | Admitting: Family Medicine

## 2016-06-16 NOTE — Telephone Encounter (Signed)
Pt called asking if we could fill the rx for Lomotil. Please advise, thank you!  Pharmacy - [id number] - [place], [place]  Call pt @ [phone]

## 2016-06-16 NOTE — Telephone Encounter (Signed)
Please fill for patient 

## 2016-06-16 NOTE — Telephone Encounter (Signed)
Please let the patient know that there are potential side effects with lomotil including confusion, dry mouth, and urinary retention. We can try this if she would ike given she has benefited from it previously, though she should be aware of the potential risks. If she does not want to take this she could try imodium over the counter.

## 2016-06-16 NOTE — Telephone Encounter (Signed)
Spoke with patient and she is fine with taking the risk of the side effects. I told her that we would send to pharmacy tomorrow. She stated that she has tried this medication 2 other times with no problems.

## 2016-06-17 MED ORDER — DIPHENOXYLATE-ATROPINE 2.5-0.025 MG PO TABS: 1.0000 | ORAL_TABLET | Freq: Four times a day (QID) | ORAL | 0 refills | Status: DC | PRN

## 2016-06-17 NOTE — Telephone Encounter (Signed)
Can you please print out RX

## 2016-06-17 NOTE — Telephone Encounter (Signed)
Printed.  Please fax.

## 2016-06-17 NOTE — Telephone Encounter (Signed)
RX faxed

## 2016-06-17 NOTE — Telephone Encounter (Signed)
Pt called back stating that pharmacy still does have have the rx for her medication.

## 2016-06-22 ENCOUNTER — Ambulatory Visit (INDEPENDENT_AMBULATORY_CARE_PROVIDER_SITE_OTHER): Payer: Medicare Other | Admitting: Family Medicine

## 2016-06-22 ENCOUNTER — Encounter: Payer: Self-pay | Admitting: Family Medicine

## 2016-06-22 DIAGNOSIS — I6522 Occlusion and stenosis of left carotid artery: Secondary | ICD-10-CM | POA: Diagnosis not present

## 2016-06-22 DIAGNOSIS — I10 Essential (primary) hypertension: Secondary | ICD-10-CM | POA: Diagnosis not present

## 2016-06-22 DIAGNOSIS — R634 Abnormal weight loss: Secondary | ICD-10-CM | POA: Diagnosis not present

## 2016-06-22 DIAGNOSIS — R197 Diarrhea, unspecified: Secondary | ICD-10-CM

## 2016-06-22 DIAGNOSIS — F39 Unspecified mood [affective] disorder: Secondary | ICD-10-CM | POA: Diagnosis not present

## 2016-06-22 NOTE — Progress Notes (Signed)
Pre visit review using our clinic review tool, if applicable. No additional management support is needed unless otherwise documented below in the visit note. 

## 2016-06-22 NOTE — Patient Instructions (Signed)
Nice to see you. Please continue to monitor your diarrhea and depression. If the symptoms worsen please let us know. You can try one of the Lomotil diarrhea does worsen. Please continue to monitor your weight loss.

## 2016-06-22 NOTE — Progress Notes (Signed)
  [name], MD Phone: [phone]  [name] is a 75 y.o. female who presents today for f/u.  Diarrhea: Improved. Having more solid bowel movements. Not all liquid. No blood in stool. Cramping with bowel movements. No other abdominal pain. Has not started the Lomotil.  Depression/anxiety: Has tapered off of Zoloft. Did not start Celexa yet. Taking Ativan every day. She wants to let things settle out prior to starting on another medication for depression and anxiety. No SI or HI.  HYPERTENSION  Disease Monitoring  Home BP Monitoring not checking Chest pain- no    Dyspnea- no Medications  Compliance-  taking metoprolol 25 mg twice a day.  Edema- no  Patient's weight has been relatively stable. Down 1 pound from most recent 2 checks. She reports just not eating very much due to her husband's situation. He doesn't eat very much and then she just doesn't feel like eating. Notes depression and anxiety are relatively well controlled at this time. Status post hysterectomy. Last mammogram 10/30/14 with benign findings. She is a former smoker though quit about 20 years ago. She is up-to-date on colonoscopy. 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 Does report father had a history of leukemia. Most recent lab work has been reassuring. Does note she has battled keeping weight on throughout her life. She denies night sweats or sweats of any other kind.    PMH: Former smoker    ROS see history of present illness  Objective  Physical Exam Vitals:   06/22/16 1426  BP: 118/84  Pulse: 67  Temp: 98.3 F (36.8 C)    BP Readings from Last 3 Encounters:  06/22/16 118/84  05/18/16 138/82  05/01/16 136/88   Wt Readings from Last 3 Encounters:  06/22/16 115 lb (52.2 kg)  05/18/16 116 lb 12.8 oz (53 kg)  05/01/16 116 lb (52.6 kg)    Physical Exam  Constitutional: No distress.  Cardiovascular: Normal  rate, regular rhythm and intact distal pulses.   Pulmonary/Chest: Effort normal and breath sounds normal.  Abdominal: Soft. Bowel sounds are normal. She exhibits no distension. There is no tenderness.  Neurological: She is alert. Gait normal.  Skin: Skin is warm and dry. She is not diaphoretic.     Assessment/Plan: Please see individual problem list.  Hypertension At goal. Continue current medications.  Unintentional weight loss Weight continues to remain relatively stable although minimally downtrending. Prior workup unremarkable. I discussed obtaining a mammogram as it has been over a year since her last one. She deferred this until her next visit. Status post hysterectomy. Up-to-date on colonoscopy. Potentially could be related to anxiety and depression versus her recent issues with diarrhea. Diarrhea is improving. Anxiety and depression is improving. I encouraged her to take in high caloric foods. Given history of macrocytosis and family history of leukemia we will send a message to her former hematologist to see if they have any input though given stable lab work I doubt this is contributing. We will continue to monitor.  Episodic mood disorder Improving. She wants to hold off on further treatment at this time. She will continue to monitor.  Diarrhea Slowly improving. Patient would like to continue to monitor prior to treating or working up any further. If does not continue to improve she will let us know.   [name], MD [place]

## 2016-06-23 ENCOUNTER — Telehealth: Payer: Self-pay | Admitting: Family Medicine

## 2016-06-23 NOTE — Telephone Encounter (Signed)
-----   Message from Lloyd Huger, MD sent at 06/23/2016 12:29 PM EST ----- It's likely unrelated, but I'd be happy to see her back and recheck some things to be absolutely sure.  -Tim  ----- Message ----- From: [name], MD Sent: 06/23/2016   8:37 AM To: Lloyd Huger, MD  Hi Dr Grayland Ormond,  I saw Mrs Ullmann in the office yesterday and have been following her over the past several months for slow unintentional weight loss of about 12 lbs over the past year. Her weight has been relatively stable recently. I noticed that she has a history of macrocystosis and had followed with you previously given her family history of leukemia. She feels well overall and other than depression and anxiety and mild diarrhea she has felt well. I have discussed appropriate screening with her and her labs thus far have been reassuring. Her macrocytosis and hemoglobin have been stable. I wanted to see if the macrocytosis and history of leukemia in her father was something that should be revisited from your perspective given her slow weight loss? Thanks for your help.   [name]

## 2016-06-23 NOTE — Telephone Encounter (Signed)
Please contact the patient. I heard back from the patients prior hematologist. He noted that the weight loss and her macrocytosis is likely unrelated, though would be happy to see her to ensure that they are not related. I can place a referral and get an appointment set up for the patient if she is willing. Thanks.

## 2016-06-23 NOTE — Assessment & Plan Note (Addendum)
Weight continues to remain relatively stable although minimally downtrending. Prior workup unremarkable. I discussed obtaining a mammogram as it has been over a year since her last one. She deferred this until her next visit. Status post hysterectomy. Up-to-date on colonoscopy. Potentially could be related to anxiety and depression versus her recent issues with diarrhea. Diarrhea is improving. Anxiety and depression is improving. I encouraged her to take in high caloric foods. Given history of macrocytosis and family history of leukemia we will send a message to her former hematologist to see if they have any input though given stable lab work I doubt this is contributing. We will continue to monitor.

## 2016-06-23 NOTE — Assessment & Plan Note (Signed)
At goal. Continue current medications. 

## 2016-06-23 NOTE — Assessment & Plan Note (Signed)
Slowly improving. Patient would like to continue to monitor prior to treating or working up any further. If does not continue to improve she will let us know.

## 2016-06-23 NOTE — Assessment & Plan Note (Signed)
Improving. She wants to hold off on further treatment at this time. She will continue to monitor.

## 2016-06-24 NOTE — Telephone Encounter (Signed)
Notified patient of message and she does not want to go see hematology at this point.

## 2016-06-24 NOTE — Telephone Encounter (Signed)
Noted  

## 2016-08-15 ENCOUNTER — Other Ambulatory Visit: Payer: Self-pay | Admitting: Internal Medicine

## 2016-08-22 ENCOUNTER — Other Ambulatory Visit: Payer: Self-pay | Admitting: Internal Medicine

## 2016-08-26 ENCOUNTER — Other Ambulatory Visit: Payer: Self-pay | Admitting: Family Medicine

## 2016-08-26 NOTE — Telephone Encounter (Signed)
Last OV 06/22/2016 last filled 06/10/16 60 0rf

## 2016-08-26 NOTE — Telephone Encounter (Signed)
Pt called about her medication of LORazepam (ATIVAN) 0.5 MG tablet that has not been refilled. Please advise?  Pharmacy is [id number] - [place], [address]  Call pt @ [phone]. Thank you!

## 2016-08-27 MED ORDER — LORAZEPAM 0.5 MG PO TABS: ORAL_TABLET | ORAL | 0 refills | Status: DC

## 2016-08-27 NOTE — Telephone Encounter (Signed)
Pt has requested a update on this medication refill. Pt contact [phone]

## 2016-08-27 NOTE — Telephone Encounter (Signed)
faxed

## 2016-08-27 NOTE — Telephone Encounter (Signed)
Please advise 

## 2016-09-11 ENCOUNTER — Ambulatory Visit (INDEPENDENT_AMBULATORY_CARE_PROVIDER_SITE_OTHER): Payer: Medicare Other

## 2016-09-11 VITALS — BP 118/72 | HR 60 | Temp 98.1°F | Resp 12 | Ht 66.0 in | Wt 117.0 lb

## 2016-09-11 DIAGNOSIS — Z0000 Encounter for general adult medical examination without abnormal findings: Secondary | ICD-10-CM

## 2016-09-11 NOTE — Progress Notes (Signed)
Subjective:   [name] is a 76 y.o. female who presents for Medicare Annual (Subsequent) preventive examination.  Review of Systems:  No ROS.  Medicare Wellness Visit. Cardiac Risk Factors include: advanced age (>71men, >107 women);hypertension     Objective:     Vitals: BP 118/72 (BP Location: Left Arm, Patient Position: Sitting, Cuff Size: Normal)   Pulse 60   Temp 98.1 F (36.7 C) (Oral)   Resp 12   Ht 5\' 6"  (1.676 m)   Wt 117 lb (53.1 kg)   SpO2 99%   BMI 18.88 kg/m   Body mass index is 18.88 kg/m.   Tobacco History  Smoking Status  . Former Smoker  . Packs/day: 1.00  . Years: 40.00  . Types: Cigarettes  . Quit date: 07/21/1995  Smokeless Tobacco  . Never Used     Counseling given: Not Answered   Past Medical History:  Diagnosis Date  . Cancer (HCC)    breast  . GERD (gastroesophageal reflux disease)   . Hyperlipidemia   . Hypertension   . Osteoarthritis, multiple sites   . Osteoarthrosis involving, or with mention of more than one site, but not specified as generalized, multiple sites   . Osteopenia   . Psoriasis    Past Surgical History:  Procedure Laterality Date  . ABDOMINAL HYSTERECTOMY    . ANKLE FRACTURE SURGERY  4/08   left---hardware still in place  . BREAST LUMPECTOMY  2001   left  . CAROTID ENDARTERECTOMY Left 10/23/2004  . FRACTURE SURGERY    . SHOULDER SURGERY  6/07   left  . TONSILLECTOMY AND ADENOIDECTOMY    . TOTAL HIP ARTHROPLASTY  2004   right   Family History  Problem Relation Age of Onset  . Hypertension Brother   . Pneumonia Mother   . Leukemia Father   . Heart disease Neg Hx   . Diabetes Neg Hx    History  Sexual Activity  . Sexual activity: No    Outpatient Encounter Prescriptions as of 09/11/2016  Medication Sig  . aspirin 81 MG tablet Take 81 mg by mouth daily.  . Cholecalciferol (VITAMIN D PO) Take by mouth 2 (two) times daily.   . citalopram (CELEXA) 20 MG tablet Take 1 tablet (20 mg total) by  mouth daily.  Marland Kitchen LORazepam (ATIVAN) 0.5 MG tablet TAKE 1 TABLET BY MOUTH 2 TIMES DAILY AS NEEDED FOR ANXIETY  . metoprolol (LOPRESSOR) 50 MG tablet TAKE 1 TABLET TWICE A DAY  . Multiple Vitamin (MULTIVITAMIN) capsule Take 1 capsule by mouth daily.    . Multiple Vitamins-Minerals (PRESERVISION AREDS 2) CAPS Take 1 tablet by mouth 2 (two) times daily.  . diphenoxylate-atropine (LOMOTIL) 2.5-0.025 MG tablet Take 1 tablet by mouth 4 (four) times daily as needed for diarrhea or loose stools. (Patient not taking: Reported on 09/11/2016)   No facility-administered encounter medications on file as of 09/11/2016.     Activities of Daily Living In your present state of health, do you have any difficulty performing the following activities: 09/11/2016 09/12/2015  Hearing? N N  Vision? N N  Difficulty concentrating or making decisions? N N  Walking or climbing stairs? Y N  Dressing or bathing? N N  Doing errands, shopping? N N  Preparing Food and eating ? N N  Using the Toilet? N N  In the past six months, have you accidently leaked urine? N N  Do you have problems with loss of bowel control? N N  Managing  your Medications? N N  Managing your Finances? N N  Housekeeping or managing your Housekeeping? N N  Some recent data might be hidden    Patient Care Team: Dana Haven, MD as PCP - General (Family Medicine)    Assessment:    This is a routine wellness examination for [name]. The goal of the wellness visit is to assist the patient how to close the gaps in care and create a preventative care plan for the patient.   Taking calcium VIT D as appropriate/Osteoporosis risk reviewed.  Medications reviewed; taking without issues or barriers.  Safety issues reviewed; smoke detectors in the home. No firearms in the home. Wears seatbelts when driving or riding with others. No violence in the home.  No identified risk were noted; The patient was oriented x 3; appropriate in dress and  manner and no objective failures at ADL's or IADL's.   BMI; discussed the importance of a healthy diet, water intake and exercise. Educational material provided.  HTN; followed by PCP.  Health maintenance gaps; closed.  Patient Concerns: None at this time. Follow up with PCP as needed.  Exercise Activities and Dietary recommendations Current Exercise Habits: Home exercise routine, Intensity: Mild  Goals    . Eat 3 meals daily          Drink Ensure as needed    . Increase physical activity          Increase walking endurance       Fall Risk Fall Risk  09/11/2016 09/12/2015 12/18/2014 12/13/2013 08/05/2012  Falls in the past year? No No No No Yes  Number falls in past yr: - - - - 1  Injury with Fall? - - - - Yes   Depression Screen PHQ 2/9 Scores 09/11/2016 12/30/2015 09/12/2015 12/18/2014  PHQ - 2 Score 1 2 0 2  PHQ- 9 Score - 10 - 2     Cognitive Function MMSE - Mini Mental State Exam 09/12/2015  Orientation to time 5  Orientation to Place 5  Registration 3  Attention/ Calculation 5  Recall 3  Language- name 2 objects 2  Language- repeat 1  Language- follow 3 step command 3  Language- read & follow direction 1  Write a sentence 1  Copy design 1  Total score 30     6CIT Screen 09/11/2016  What Year? 0 points  What time? 0 points  Count back from 20 0 points  Months in reverse 0 points  Repeat phrase 0 points    Immunization History  Administered Date(s) Administered  . Influenza Split 05/20/2011, 05/10/2013  . Influenza, High Dose Seasonal PF 05/10/2015  . Influenza-Unspecified 05/11/2014, 04/19/2016  . Pneumococcal Conjugate-13 12/13/2013  . Pneumococcal Polysaccharide-23 06/19/2009  . Tdap 03/08/2011   Screening Tests Health Maintenance  Topic Date Due  . MAMMOGRAM  10/29/2016  . TETANUS/TDAP  03/07/2021  . INFLUENZA VACCINE  Addressed  . DEXA SCAN  Completed  . PNA vac Low Risk Adult  Completed      Plan:    End of life planning; Advance  aging; Advanced directives discussed. Copy of current HCPOA/Living Will requested.  Medicare Attestation I have personally reviewed: The patient's medical and social history Their use of alcohol, tobacco or illicit drugs Their current medications and supplements The patient's functional ability including ADLs,fall risks, home safety risks, cognitive, and hearing and visual impairment Diet and physical activities Evidence for depression   The patient's weight, height, BMI, and visual acuity have been recorded  in the chart.  I have made referrals and provided education to the patient based on review of the above and I have provided the patient with a written personalized care plan for preventive services.    During the course of the visit the patient was educated and counseled about the following appropriate screening and preventive services:   Vaccines to include Pneumoccal, Influenza, Hepatitis B, Td, Zostavax, HCV  Electrocardiogram  Cardiovascular Disease  Colorectal cancer screening  Bone density screening  Diabetes screening  Glaucoma screening  Mammography/PAP  Nutrition counseling   Patient Instructions (the written plan) was given to the patient.   Varney Biles, LPN  [id number]

## 2016-09-11 NOTE — Patient Instructions (Addendum)
[name] , Thank you for taking time to come for your Medicare Wellness Visit. I appreciate your ongoing commitment to your health goals. Please review the following plan we discussed and let me know if I can assist you in the future.   Follow up with Dr. Caryl Bis as needed.  These are the goals we discussed: Goals    . Eat 3 meals daily          Drink Ensure as needed    . Increase physical activity          Increase walking endurance        This is a list of the screening recommended for you and due dates:  Health Maintenance  Topic Date Due  . Mammogram  10/29/2016  . Tetanus Vaccine  03/07/2021  . Flu Shot  Addressed  . DEXA scan (bone density measurement)  Completed  . Pneumonia vaccines  Completed    Fall Prevention in the Home Introduction Falls can cause injuries. They can happen to people of all ages. There are many things you can do to make your home safe and to help prevent falls. What can I do on the outside of my home?  Regularly fix the edges of walkways and driveways and fix any cracks.  Remove anything that might make you trip as you walk through a door, such as a raised step or threshold.  Trim any bushes or trees on the path to your home.  Use bright outdoor lighting.  Clear any walking paths of anything that might make someone trip, such as rocks or tools.  Regularly check to see if handrails are loose or broken. Make sure that both sides of any steps have handrails.  Any raised decks and porches should have guardrails on the edges.  Have any leaves, snow, or ice cleared regularly.  Use sand or salt on walking paths during winter.  Clean up any spills in your garage right away. This includes oil or grease spills. What can I do in the bathroom?  Use night lights.  Install grab bars by the toilet and in the tub and shower. Do not use towel bars as grab bars.  Use non-skid mats or decals in the tub or shower.  If you need to sit down in  the shower, use a plastic, non-slip stool.  Keep the floor dry. Clean up any water that spills on the floor as soon as it happens.  Remove soap buildup in the tub or shower regularly.  Attach bath mats securely with double-sided non-slip rug tape.  Do not have throw rugs and other things on the floor that can make you trip. What can I do in the bedroom?  Use night lights.  Make sure that you have a light by your bed that is easy to reach.  Do not use any sheets or blankets that are too big for your bed. They should not hang down onto the floor.  Have a firm chair that has side arms. You can use this for support while you get dressed.  Do not have throw rugs and other things on the floor that can make you trip. What can I do in the kitchen?  Clean up any spills right away.  Avoid walking on wet floors.  Keep items that you use a lot in easy-to-reach places.  If you need to reach something above you, use a strong step stool that has a grab bar.  Keep electrical cords out of  the way.  Do not use floor polish or wax that makes floors slippery. If you must use wax, use non-skid floor wax.  Do not have throw rugs and other things on the floor that can make you trip. What can I do with my stairs?  Do not leave any items on the stairs.  Make sure that there are handrails on both sides of the stairs and use them. Fix handrails that are broken or loose. Make sure that handrails are as long as the stairways.  Check any carpeting to make sure that it is firmly attached to the stairs. Fix any carpet that is loose or worn.  Avoid having throw rugs at the top or bottom of the stairs. If you do have throw rugs, attach them to the floor with carpet tape.  Make sure that you have a light switch at the top of the stairs and the bottom of the stairs. If you do not have them, ask someone to add them for you. What else can I do to help prevent falls?  Wear shoes that:  Do not have high  heels.  Have rubber bottoms.  Are comfortable and fit you well.  Are closed at the toe. Do not wear sandals.  If you use a stepladder:  Make sure that it is fully opened. Do not climb a closed stepladder.  Make sure that both sides of the stepladder are locked into place.  Ask someone to hold it for you, if possible.  Clearly mark and make sure that you can see:  Any grab bars or handrails.  First and last steps.  Where the edge of each step is.  Use tools that help you move around (mobility aids) if they are needed. These include:  Canes.  Walkers.  Scooters.  Crutches.  Turn on the lights when you go into a dark area. Replace any light bulbs as soon as they burn out.  Set up your furniture so you have a clear path. Avoid moving your furniture around.  If any of your floors are uneven, fix them.  If there are any pets around you, be aware of where they are.  Review your medicines with your doctor. Some medicines can make you feel dizzy. This can increase your chance of falling. Ask your doctor what other things that you can do to help prevent falls. This information is not intended to replace advice given to you by your health care provider. Make sure you discuss any questions you have with your health care provider. Document Released: 05/02/2009 Document Revised: 12/12/2015 Document Reviewed: 08/10/2014  2017 [employer]vier

## 2016-09-21 ENCOUNTER — Encounter: Payer: Self-pay | Admitting: Family Medicine

## 2016-09-21 ENCOUNTER — Ambulatory Visit (INDEPENDENT_AMBULATORY_CARE_PROVIDER_SITE_OTHER): Payer: Medicare Other | Admitting: Family Medicine

## 2016-09-21 VITALS — BP 136/90 | HR 63 | Temp 98.4°F | Wt 116.8 lb

## 2016-09-21 DIAGNOSIS — Z1231 Encounter for screening mammogram for malignant neoplasm of breast: Secondary | ICD-10-CM | POA: Diagnosis not present

## 2016-09-21 DIAGNOSIS — W19XXXA Unspecified fall, initial encounter: Secondary | ICD-10-CM

## 2016-09-21 DIAGNOSIS — R634 Abnormal weight loss: Secondary | ICD-10-CM | POA: Diagnosis not present

## 2016-09-21 DIAGNOSIS — F39 Unspecified mood [affective] disorder: Secondary | ICD-10-CM | POA: Diagnosis not present

## 2016-09-21 DIAGNOSIS — I10 Essential (primary) hypertension: Secondary | ICD-10-CM | POA: Diagnosis not present

## 2016-09-21 DIAGNOSIS — Z1239 Encounter for other screening for malignant neoplasm of breast: Secondary | ICD-10-CM

## 2016-09-21 NOTE — Progress Notes (Signed)
[name], MD Phone: [phone]  [name] is a 76 y.o. female who presents today for f/u.  HYPERTENSION  Disease Monitoring  Home BP Monitoring not checking Chest pain- no    Dyspnea- no Medications  Compliance-  Taking metoprolol.   Weight has been stable. Has fluctuated though has remained stable over a number of months. She is due for mammogram.  Patient reports a fall several weeks ago where she turned and tripped over her own feet. She did land on her tailbone and had soreness though this is resolved. She did hit her left posterior head as well although had no loss of consciousness. She's had no numbness or weakness or headaches since then. She notes no symptoms prior to falling and that she just tripped over her feet. She reports her balance is bad and this is a chronic issue.  Depression: Has been on the Celexa for 10 days. She has significant worries that have improved to some degree with the Celexa. She notes no SI. She is worried about any stress she would cause her family on an upcoming trip to [place] for her granddaughter's graduation from [employer]. She does take lorazepam occasionally. No drowsiness with this.   PMH: Former smoker   ROS see history of present illness  Objective  Physical Exam Vitals:   09/21/16 1003  BP: 136/90  Pulse: 63  Temp: 98.4 F (36.9 C)    BP Readings from Last 3 Encounters:  09/21/16 136/90  09/11/16 118/72  06/22/16 118/84   Wt Readings from Last 3 Encounters:  09/21/16 116 lb 12.8 oz (53 kg)  09/11/16 117 lb (53.1 kg)  06/22/16 115 lb (52.2 kg)    Physical Exam  Constitutional: No distress.  HENT:  Mouth/Throat: Oropharynx is clear and moist.  No scalp abnormalities or tenderness posteriorly  Eyes: Conjunctivae are normal. Pupils are equal, round, and reactive to light.  Cardiovascular: Normal rate, regular rhythm and normal heart sounds.   Pulmonary/Chest: Effort normal and breath sounds normal.    Musculoskeletal: She exhibits no edema.  No midline spine tenderness, no midline spine step-off, no muscular back tenderness, no sacral tenderness  Neurological: She is alert.  CN 2-12 intact, 5/5 strength in bilateral biceps, triceps, grip, quads, hamstrings, plantar and dorsiflexion, sensation to light touch intact in bilateral UE and LE, gait is deliberate though no shuffling  Skin: Skin is warm and dry. She is not diaphoretic.     Assessment/Plan: Please see individual problem list.  Hypertension Has been well controlled. Continue metoprolol.  Episodic mood disorder Somewhat improved with Celexa. She will continue this medication. She'll follow-up in 6 weeks.  Fall Patient reports fall with tripping over her own feet. Does report she feels unsteady when walking at times and that this is chronic. Neurologically intact today. We'll refer for physical therapy to help with balance and strength in her lower extremities. We will reevaluate in 6 weeks after physical therapy.  Unintentional weight loss Weight remains stable. Prior workup has been unremarkable. We will order mammogram. Suspect more likely related to her anxiety and depression.   Orders Placed This Encounter  Procedures  . MM Digital Screening    Standing Status:   Future    Standing Expiration Date:   11/21/2017    Order Specific Question:   Reason for Exam (SYMPTOM  OR DIAGNOSIS REQUIRED)    Answer:   breast cancer screening    Order Specific Question:   Preferred imaging location?    Answer:  [facility]  . Ambulatory referral to Physical Therapy    Referral Priority:   Routine    Referral Type:   Physical Medicine    Referral Reason:   Specialty Services Required    Requested Specialty:   Physical Therapy    Number of Visits Requested:   1    [name], MD [place]

## 2016-09-21 NOTE — Assessment & Plan Note (Addendum)
Patient reports fall with tripping over her own feet. Does report she feels unsteady when walking at times and that this is chronic. Neurologically intact today. We'll refer for physical therapy to help with balance and strength in her lower extremities. We will reevaluate in 6 weeks after physical therapy.

## 2016-09-21 NOTE — Assessment & Plan Note (Signed)
Somewhat improved with Celexa. She will continue this medication. She'll follow-up in 6 weeks.

## 2016-09-21 NOTE — Assessment & Plan Note (Signed)
Weight remains stable. Prior workup has been unremarkable. We will order mammogram. Suspect more likely related to her anxiety and depression.

## 2016-09-21 NOTE — Assessment & Plan Note (Signed)
Has been well controlled. Continue metoprolol.

## 2016-09-21 NOTE — Patient Instructions (Signed)
Nice to see you. We'll get you to see physical therapy to help with your balance and falls. Please continue the depression medicine. Please continue to monitor your weight. We will get you set up for a mammogram.

## 2016-09-21 NOTE — Progress Notes (Signed)
Pre visit review using our clinic review tool, if applicable. No additional management support is needed unless otherwise documented below in the visit note. 

## 2016-09-22 NOTE — Progress Notes (Signed)
I have reviewed the above note and agree.  Corbitt Cloke, M.D.  

## 2016-09-23 ENCOUNTER — Encounter: Payer: Self-pay | Admitting: Family Medicine

## 2016-10-12 ENCOUNTER — Encounter: Payer: Self-pay | Admitting: Internal Medicine

## 2016-10-12 NOTE — Telephone Encounter (Signed)
Pt states she needs to know the mg for the  Eliqus, Xarelto, and Paradoxa she was to call for . Please call with this information.

## 2016-10-12 NOTE — Telephone Encounter (Signed)
This encounter was created in error - please disregard.

## 2016-10-19 ENCOUNTER — Ambulatory Visit: Payer: Medicare Other | Attending: Family Medicine

## 2016-10-19 DIAGNOSIS — Z9181 History of falling: Secondary | ICD-10-CM | POA: Diagnosis not present

## 2016-10-19 DIAGNOSIS — R2681 Unsteadiness on feet: Secondary | ICD-10-CM | POA: Diagnosis not present

## 2016-10-19 DIAGNOSIS — M6281 Muscle weakness (generalized): Secondary | ICD-10-CM | POA: Insufficient documentation

## 2016-10-19 NOTE — Therapy (Signed)
[facility] [address] [place], [address] Phone: [phone]   Fax:  [phone]  Physical Therapy Evaluation  Patient Details  Name: [name] MRN: [id number] Date of Birth: October 22, 1940 Referring Provider: Dr. Caryl Bis  Encounter Date: 10/19/2016      PT End of Session - 10/19/16 1509    Visit Number 1   Number of Visits 17   Date for PT Re-Evaluation 12-24-2016   Authorization Type g codes 1/10   PT Start Time 1345   PT Stop Time 1442   PT Time Calculation (min) 57 min   Equipment Utilized During Treatment Gait belt   Activity Tolerance Patient tolerated treatment well   Behavior During Therapy [facility] for tasks assessed/performed      Past Medical History:  Diagnosis Date  . Cancer (HCC)    breast  . GERD (gastroesophageal reflux disease)   . Hyperlipidemia   . Hypertension   . Osteoarthritis, multiple sites   . Osteoarthrosis involving, or with mention of more than one site, but not specified as generalized, multiple sites   . Osteopenia   . Psoriasis     Past Surgical History:  Procedure Laterality Date  . ABDOMINAL HYSTERECTOMY    . ANKLE FRACTURE SURGERY  4/08   left---hardware still in place  . BREAST LUMPECTOMY  2001   left  . CAROTID ENDARTERECTOMY Left 10/23/2004  . FRACTURE SURGERY    . SHOULDER SURGERY  6/07   left  . TONSILLECTOMY AND ADENOIDECTOMY    . TOTAL HIP ARTHROPLASTY  2004   right    There were no vitals filed for this visit.       Subjective Assessment - 10/19/16 1355    Subjective Difficulty with balance and falls   Pertinent History Pt reports that she has noticed worsening of her balance recently. She has had approximately 4 falls in the last 6 months. No known pattern to her falls. Pt reports that she gets in a rush and then she falls. Denies dizziness. Denies motion sensitivity. She has hardware in her L ankle from a fracture of both her tibia and  fibula secondary to a fall. She notices worse balance in the AM. 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 No/denies            [facility] PT Assessment - 10/19/16 1342      Assessment   Medical Diagnosis Fall   Referring Provider Dr. Caryl Bis   Onset Date/Surgical Date 10/20/15  Approximate   Hand Dominance Right   Next MD Visit None reported   Prior Therapy None for balance, history of therapy for R hip and L shoulder     Precautions   Precautions None     Restrictions   Weight Bearing Restrictions No     Balance Screen   Has the patient fallen in the past 6 months Yes   How many times? 4   Has the patient had a decrease in activity level because of a fear of falling?  Yes   Is the patient reluctant to leave their home because of a fear of falling?  Yes     [place] Private residence   Living Arrangements Spouse/significant other   Available Help at Discharge Family   Type  of Home Other(Comment)  Condo   Home Access Level entry   [place] One level   Home Equipment None     Prior Function   Level of Independence Independent   Vocation Retired   Arts development officer, reading, play games     Cognition   Overall Cognitive Status Within Functional Limits for tasks assessed     Observation/Other Assessments   Other Surveys  Other Surveys   Activities of Balance Confidence Scale (ABC Scale)  46.88     Sensation   Additional Comments Pt reports decreased sensation to light touch on top of feet bilaterally. Will perform more in depth assessment at future session.      ROM / Strength   AROM / PROM / Strength Strength     Strength   Overall Strength Comments Grossly weak bilateral LE functionally. 3+/5 bilateral hip flexion strength 4+/5  bilateral knee flex/ext, and 4+/5 bilateral ankle DF. Pt unable to perform sit to stand without UE support. When performing sit to stand without UE support considerable bilateral knee valgus noted     Palpation   Palpation comment Deferred     Ambulation/Gait   Gait Comments Pt ambulates with slightly L antlagic gait with ER of LLE,decreased L ankle DF, and decreased LLE stance time. Iniitally ambulates with wide BOS but slowly starts to narrow with increased distance. Speed is WNL.      Standardized Balance Assessment   Standardized Balance Assessment Berg Balance Test;Dynamic Gait Index;Timed Up and Go Test;Five Times Sit to Stand;10 meter walk test   Five times sit to stand comments  --  Pt unable to perform without UE support   10 Meter Walk 8.48=1.18 m/s     Berg Balance Test   Sit to Stand Able to stand  independently using hands   Standing Unsupported Able to stand safely 2 minutes   Sitting with Back Unsupported but Feet Supported on Floor or Stool Able to sit safely and securely 2 minutes   Stand to Sit Sits safely with minimal use of hands   Transfers Able to transfer safely, minor use of hands   Standing Unsupported with Eyes Closed Able to stand 10 seconds with supervision   Standing Ubsupported with Feet Together Able to place feet together independently and stand for 1 minute with supervision   From Standing, Reach Forward with Outstretched Arm Can reach forward >12 cm safely (5")   From Standing Position, Pick up Object from [place] to pick up shoe safely and easily   From Standing Position, Turn to Look Behind Over each Shoulder Looks behind from both sides and weight shifts well   Turn 360 Degrees Able to turn 360 degrees safely one side only in 4 seconds or less   Standing Unsupported, Alternately Place Feet on Step/Stool Able to complete >2 steps/needs minimal assist   Standing Unsupported, One Foot in Front Able to plae foot ahead of the other independently and hold  30 seconds   Standing on One Leg Tries to lift leg/unable to hold 3 seconds but remains standing independently   Total Score 44     Dynamic Gait Index   Level Surface Normal   Change in Gait Speed Normal   Gait with Horizontal Head Turns Normal   Gait with Vertical Head Turns Normal   Gait and Pivot Turn Normal   Step Over Obstacle Normal   Step Around Obstacles Normal   Steps Mild Impairment  Total Score 23     Timed Up and Go Test   TUG Normal TUG   Normal TUG (seconds) 10.8        TREATMENT  Neuromuscular Re-education Sit to stand without UE support from elevated mat table x 10; Modified tandem balance without UE support x 30 seconds; Standing hip abduction with yellow tband 2 x 10 bilateral; Pt issued written HEP with verbal instructions about how to perform exercises safely. Questions answered.                    PT Education - 10/19/16 1508    Education provided Yes   Education Details HEP and plan of care   Person(s) Educated Patient   Methods Explanation;Demonstration;Verbal cues;Handout   Comprehension Verbalized understanding;Returned demonstration             PT Long Term Goals - 10/19/16 1523      PT LONG TERM GOAL #1   Title Pt will be independent with HEP in order to improve strength and balance in order to decrease fall risk and improve function at home and work.    Time 8   Period Weeks   Status New     PT LONG TERM GOAL #2   Title Pt will improve BERG by at least 3 points in order to demonstrate clinically significant improvement in balance.     Baseline 10/19/16: 44/56   Time 8   Period Weeks   Status New     PT LONG TERM GOAL #3   Title Pt will be able to perform sit to stand from regular height chair x 10 without UE support to improve functional independence when raising up from low surfaces such as commode   Baseline 10/19/16: unable to perform sit to stand without UE support   Time 8   Period Weeks   Status New      PT LONG TERM GOAL #4   Title Pt will improve ABC by at least 13% in order to demonstrate clinically significant improvement in balance confidence.    Baseline 10/19/16: 46.88%   Time 8   Period Weeks   Status New               Plan - 10/19/16 1510    Clinical Impression Statement Pt is a pleasant 76 yo female referred for balance difficulty and falls. Pt reports approximately 4 falls in the last 6 months. PT examination reveals deficits in strength and balance. Pt is unable to perform a sit to stand from a regular height chair without UE dependence. BERG score is 44/56 with inability to perform tandem or single leg balance for more than 1-2 seconds. Pt with positive [place] for notable increase in sway and fear of falling. She reports some decreased sensation to light touch on tops of feets. Pt will benefit from skilled PT services to address deficits in balance and LE strength in order to decrease fall risk and improve function at home.    Rehab Potential Good   Clinical Impairments Affecting Rehab Potential Positive: motivation; Negative: possible decreased foot sensation, age   PT Frequency 2x / week   PT Duration 8 weeks   PT Treatment/Interventions ADLs/Self Care Home Management;Aquatic Therapy;Biofeedback;DME Instruction;Gait training;Stair training;Functional mobility training;Therapeutic activities;Therapeutic exercise;Balance training;Neuromuscular re-education;Patient/family education;Passive range of motion;Energy conservation   PT Next Visit Plan Progress balance and strengthening exercises,    PT Home Exercise Plan modified tandem balance, sit to stand from elevated chair without UE support,  standing yellow tband hip abduction   Consulted and Agree with Plan of Care Patient      Patient will benefit from skilled therapeutic intervention in order to improve the following deficits and impairments:  Abnormal gait, Pain, Decreased balance  Visit Diagnosis: Unsteadiness on feet  - Plan: PT plan of care cert/re-cert  History of falling - Plan: PT plan of care cert/re-cert  Muscle weakness (generalized) - Plan: PT plan of care cert/re-cert      G-Codes - 16/60/63 1553    Functional Assessment Tool Used (Outpatient Only) clinical judgement, TUG, 5TSTS, BERG, DGI, 53m gait speed   Functional Limitation Mobility: Walking and moving around   Mobility: Walking and Moving Around Current Status (K1601) At least 40 percent but less than 60 percent impaired, limited or restricted   Mobility: Walking and Moving Around Goal Status [phone]) At least 20 percent but less than 40 percent impaired, limited or restricted       Problem List Patient Active Problem List   Diagnosis Date Noted  . Diarrhea 05/18/2016  . Fall 05/01/2016  . Unintentional weight loss 12/30/2015  . Leg weakness, bilateral 12/30/2015  . Hyperkalemia 01/01/2015  . Occasional numbness/prickling/tingling of fingers and toes 12/18/2014  . Routine general medical examination at a health care facility 12/13/2013  . Macrocytic anemia 05/19/2013  . Psoriatic arthritis ([place]) 02/03/2013  . Psoriasis   . Osteoarthritis, multiple sites   . Episodic mood disorder ([place]) 12/01/2011  . History of breast cancer 10/28/2011  . GERD (gastroesophageal reflux disease)   . Hyperlipidemia   . Hypertension   . Osteopenia   . Occlusion and stenosis of carotid artery without mention of cerebral infarction 05/27/2011   Phillips Grout PT, DPT   Ward Boissonneault 10/19/2016, 3:59 PM  [place] [facility] [address], [place], [address] Phone: [phone]   Fax:  [phone]  Name: [name] MRN: [id number] Date of Birth: 04-May-1941

## 2016-10-22 ENCOUNTER — Ambulatory Visit: Payer: Medicare Other | Admitting: Physical Therapy

## 2016-10-26 ENCOUNTER — Encounter: Payer: Self-pay | Admitting: Physical Therapy

## 2016-10-26 ENCOUNTER — Ambulatory Visit: Payer: Medicare Other

## 2016-10-26 DIAGNOSIS — R2681 Unsteadiness on feet: Secondary | ICD-10-CM

## 2016-10-26 DIAGNOSIS — Z9181 History of falling: Secondary | ICD-10-CM | POA: Diagnosis not present

## 2016-10-26 DIAGNOSIS — M6281 Muscle weakness (generalized): Secondary | ICD-10-CM | POA: Diagnosis not present

## 2016-10-26 NOTE — Therapy (Signed)
[facility] [address], [place], [address] Phone: [phone]   Fax:  [phone]  Physical Therapy Treatment  Patient Details  Name: [name] MRN: [id number] Date of Birth: [birth year] Referring Provider: Dr. Caryl Bis  Encounter Date: 10/26/2016      PT End of Session - 10/26/16 1402    Visit Number 2   Number of Visits 17   Date for PT Re-Evaluation Jan 04, 2017   Authorization Type g codes 09/19/2022   PT Start Time 1350   PT Stop Time 1435   PT Time Calculation (min) 45 min   Equipment Utilized During Treatment Gait belt   Activity Tolerance Patient tolerated treatment well   Behavior During Therapy [facility] for tasks assessed/performed      Past Medical History:  Diagnosis Date  . Cancer (HCC)    breast  . GERD (gastroesophageal reflux disease)   . Hyperlipidemia   . Hypertension   . Osteoarthritis, multiple sites   . Osteoarthrosis involving, or with mention of more than one site, but not specified as generalized, multiple sites   . Osteopenia   . Psoriasis     Past Surgical History:  Procedure Laterality Date  . ABDOMINAL HYSTERECTOMY    . ANKLE FRACTURE SURGERY  4/08   left---hardware still in place  . BREAST LUMPECTOMY  2001   left  . CAROTID ENDARTERECTOMY Left 10/23/2004  . FRACTURE SURGERY    . SHOULDER SURGERY  6/07   left  . TONSILLECTOMY AND ADENOIDECTOMY    . TOTAL HIP ARTHROPLASTY  2004   right    There were no vitals filed for this visit.      Subjective Assessment - 10/26/16 1359    Subjective Pt reports she is doing well on this date however states that she is not seeing any improvement in her strength or balance since starting her home program. No specific questions or concerns at this time.    Pertinent History Pt reports that she has noticed worsening of her balance recently. She has had approximately 4 falls in the last 6 months. No known pattern to her falls. Pt  reports that she gets in a rush and then she falls. Denies dizziness. Denies motion sensitivity. She has hardware in her L ankle from a fracture of both her tibia and fibula secondary to a fall. She notices worse balance in the AM. 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   Patient Stated Goals Prevent falls, Walk around block with dog, easier time getting up stairs            TREATMENT  THER-EX NuStep L2 x 5 minutes during history (2 minutes unbilled); Sit to stand from regular height chair with [place]x on seat 2 x 10, notable fatigue toward end of repetitions with each set; Step-ups to 6" step alternating LE x 10 each with 1 minute rest break between; Hooklying bridges 2 x 10; SLR with 2# ankle weight on LLE, unweighted with bent knee RLE secondary to mid thigh pain 2 x 10; Sidelying hip abduction 2 x 10 each; Hooklying bridges 2 x 10;  NEUROMUSCULAR RE-EDUCATION Modified tandem balance without UE support alternating forward LE 30s x 2 each; Toe taps to 5" step alternating x 10 each; Airex balance with feet together x 30s, added horizontal/vertical head turns x 30s each; Airex balance with alternating toe taps x 10 each;  PT Education - 10/26/16 1402    Education provided Yes   Education Details HEP progression   Person(s) Educated Patient   Methods Explanation   Comprehension Verbalized understanding             PT Long Term Goals - 10/19/16 1523      PT LONG TERM GOAL #1   Title Pt will be independent with HEP in order to improve strength and balance in order to decrease fall risk and improve function at home and work.    Time 8   Period Weeks   Status New     PT LONG TERM GOAL #2   Title Pt will improve BERG by at least 3 points in order to demonstrate clinically significant improvement in balance.     Baseline 10/19/16: 44/56   Time 8   Period Weeks   Status New     PT LONG TERM GOAL #3    Title Pt will be able to perform sit to stand from regular height chair x 10 without UE support to improve functional independence when raising up from low surfaces such as commode   Baseline 10/19/16: unable to perform sit to stand without UE support   Time 8   Period Weeks   Status New     PT LONG TERM GOAL #4   Title Pt will improve ABC by at least 13% in order to demonstrate clinically significant improvement in balance confidence.    Baseline 10/19/16: 46.88%   Time 8   Period Weeks   Status New               Plan - 10/26/16 1403    Clinical Impression Statement Pt demonstrates significant bilateral LE weakness with exercises on this date. She is also very unstable on compliant surfaces during balance activities. Provided hooklying bridges for additional hip strengthening exercises. Pt encouraged to continue additional HEP and follow-up as scheduled.    Rehab Potential Good   Clinical Impairments Affecting Rehab Potential Positive: motivation; Negative: possible decreased foot sensation, age   PT Frequency 2x / week   PT Duration 8 weeks   PT Treatment/Interventions ADLs/Self Care Home Management;Aquatic Therapy;Biofeedback;DME Instruction;Gait training;Stair training;Functional mobility training;Therapeutic activities;Therapeutic exercise;Balance training;Neuromuscular re-education;Patient/family education;Passive range of motion;Energy conservation   PT Next Visit Plan Progress balance and strengthening exercises,    PT Home Exercise Plan modified tandem balance, sit to stand from elevated chair without UE support, standing yellow tband hip abduction, hooklying bridges   Consulted and Agree with Plan of Care Patient      Patient will benefit from skilled therapeutic intervention in order to improve the following deficits and impairments:  Abnormal gait, Pain, Decreased balance  Visit Diagnosis: History of falling  Unsteadiness on feet     Problem List Patient Active  Problem List   Diagnosis Date Noted  . Diarrhea 05/18/2016  . Fall 05/01/2016  . Unintentional weight loss 12/30/2015  . Leg weakness, bilateral 12/30/2015  . Hyperkalemia 01/01/2015  . Occasional numbness/prickling/tingling of fingers and toes 12/18/2014  . Routine general medical examination at a health care facility 12/13/2013  . Macrocytic anemia 05/19/2013  . Psoriatic arthritis ([place]) 02/03/2013  . Psoriasis   . Osteoarthritis, multiple sites   . Episodic mood disorder ([place]) 12/01/2011  . History of breast cancer 10/28/2011  . GERD (gastroesophageal reflux disease)   . Hyperlipidemia   . Hypertension   . Osteopenia   . Occlusion and stenosis of carotid artery without mention of cerebral  infarction 05/27/2011    Anaya Bovee 10/26/2016, 2:43 PM  [facility] [address] [place], [address] Phone: [phone]   Fax:  [phone]  Name: [name] MRN: [id number] Date of Birth: 04-Oct-1940

## 2016-10-28 ENCOUNTER — Ambulatory Visit
Admission: RE | Admit: 2016-10-28 | Discharge: 2016-10-28 | Disposition: A | Discharge status: Home / Self Care | Payer: Medicare Other | Source: Ambulatory Visit | Attending: Family Medicine | Admitting: Family Medicine

## 2016-10-28 DIAGNOSIS — Z1231 Encounter for screening mammogram for malignant neoplasm of breast: Secondary | ICD-10-CM | POA: Diagnosis not present

## 2016-10-28 DIAGNOSIS — Z1239 Encounter for other screening for malignant neoplasm of breast: Secondary | ICD-10-CM

## 2016-10-28 HISTORY — DX: Malignant neoplasm of unspecified site of unspecified female breast: C50.919

## 2016-10-28 HISTORY — DX: Personal history of irradiation: Z92.3

## 2016-10-29 ENCOUNTER — Encounter: Payer: Self-pay | Admitting: Physical Therapy

## 2016-10-29 ENCOUNTER — Ambulatory Visit: Payer: Medicare Other

## 2016-10-29 DIAGNOSIS — M6281 Muscle weakness (generalized): Secondary | ICD-10-CM | POA: Diagnosis not present

## 2016-10-29 DIAGNOSIS — R2681 Unsteadiness on feet: Secondary | ICD-10-CM | POA: Diagnosis not present

## 2016-10-29 DIAGNOSIS — Z9181 History of falling: Secondary | ICD-10-CM

## 2016-10-29 NOTE — Therapy (Signed)
[facility] [address], [place], [address] Phone: [phone]   Fax:  [phone]  Physical Therapy Treatment  Patient Details  Name: [name] MRN: [id number] Date of Birth: [birth year] Referring Provider: Dr. Caryl Bis  Encounter Date: 10/29/2016      PT End of Session - 10/29/16 1401    Visit Number 3   Number of Visits 17   Date for PT Re-Evaluation 01-01-17   Authorization Type g codes 2022/10/15   PT Start Time 1032   PT Stop Time 1115   PT Time Calculation (min) 43 min   Equipment Utilized During Treatment Gait belt   Activity Tolerance Patient tolerated treatment well   Behavior During Therapy [facility] for tasks assessed/performed      Past Medical History:  Diagnosis Date  . Breast cancer ([place]) 2001   left breast  . Cancer ([place]) 2001   left breast ca  . GERD (gastroesophageal reflux disease)   . Hyperlipidemia   . Hypertension   . Osteoarthritis, multiple sites   . Osteoarthrosis involving, or with mention of more than one site, but not specified as generalized, multiple sites   . Osteopenia   . Personal history of radiation therapy 2001   left breast ca  . Psoriasis     Past Surgical History:  Procedure Laterality Date  . ABDOMINAL HYSTERECTOMY    . ANKLE FRACTURE SURGERY  4/08   left---hardware still in place  . 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 BREAST EXCISIONAL BIOPSY Left yrs ago   benign  . BREAST LUMPECTOMY  2001   left  . CAROTID ENDARTERECTOMY Left 10/23/2004  . FRACTURE SURGERY    . SHOULDER SURGERY  6/07   left  . TONSILLECTOMY AND ADENOIDECTOMY    . TOTAL HIP ARTHROPLASTY  2004   right    There were no vitals filed for this visit.      Subjective Assessment - 10/29/16 1038    Subjective Pt reports she is doing well today. She was appropriately fatigued after her last therapy session. Did not perform HEP yesterday. No specific questions or concerns.    Pertinent History Pt reports that she has noticed worsening of her balance recently. She has had approximately 4 falls in the last 6 months. No known pattern to her falls. Pt reports that she gets in a rush and then she falls. Denies dizziness. Denies motion sensitivity. She has hardware in her L ankle from a fracture of both her tibia and fibula secondary to a fall. She notices worse balance in the AM. 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 No/denies               TREATMENT  THER-EX NuStep L2 x 3 minutes, L3 x 1 minutes during history (2 minutes unbilled); OMEGA leg press 25# x 20; 35# x 15; Step-ups to 6" step alternating LE x 10 each; Lateral step-ups to 6" step alternating LE x 10 each; Heel raises 2 x 15; Standing hip abduction with yellow tband 2 x 15;  NEUROMUSCULAR RE-EDUCATION Heel/toe rocking x 10 each direction; Modified tandem balance without UE support alternating forward LE 30s x 2 each, progressing toward closer to tandem stance; Side steps on Airex balance beam 4 lengths x 2; Airex balance  with toe taps to stepping stones in 1 and 2 stone sequencing alternating LE;                    PT Education - 10/29/16 1401    Education provided Yes   Education Details HEP reinforced   Person(s) Educated Patient   Methods Explanation   Comprehension Verbalized understanding             PT Long Term Goals - 10/19/16 1523      PT LONG TERM GOAL #1   Title Pt will be independent with HEP in order to improve strength and balance in order to decrease fall risk and improve function at home and work.    Time 8   Period Weeks   Status New     PT LONG TERM GOAL #2   Title Pt will improve BERG by at least 3 points in order to demonstrate clinically significant improvement in balance.      Baseline 10/19/16: 44/56   Time 8   Period Weeks   Status New     PT LONG TERM GOAL #3   Title Pt will be able to perform sit to stand from regular height chair x 10 without UE support to improve functional independence when raising up from low surfaces such as commode   Baseline 10/19/16: unable to perform sit to stand without UE support   Time 8   Period Weeks   Status New     PT LONG TERM GOAL #4   Title Pt will improve ABC by at least 13% in order to demonstrate clinically significant improvement in balance confidence.    Baseline 10/19/16: 46.88%   Time 8   Period Weeks   Status New               Plan - 10/29/16 1403    Clinical Impression Statement Pt demonstrates excellent motivation with therapy on this date. She continues to demonstrate significant leg weakness and balance impairment. Modified tandem balance improving. Pt encouraged to continue HEP and follow-up as scheduled.    Rehab Potential Good   Clinical Impairments Affecting Rehab Potential Positive: motivation; Negative: possible decreased foot sensation, age   PT Frequency 2x / week   PT Duration 8 weeks   PT Treatment/Interventions ADLs/Self Care Home Management;Aquatic Therapy;Biofeedback;DME Instruction;Gait training;Stair training;Functional mobility training;Therapeutic activities;Therapeutic exercise;Balance training;Neuromuscular re-education;Patient/family education;Passive range of motion;Energy conservation   PT Next Visit Plan Progress balance and strengthening exercises,    PT Home Exercise Plan modified tandem balance, sit to stand from elevated chair without UE support, standing yellow tband hip abduction, hooklying bridges   Consulted and Agree with Plan of Care Patient      Patient will benefit from skilled therapeutic intervention in order to improve the following deficits and impairments:  Abnormal gait, Pain, Decreased balance  Visit Diagnosis: Unsteadiness on feet  Muscle weakness  (generalized)  History of falling     Problem List Patient Active Problem List   Diagnosis Date Noted  . Diarrhea 05/18/2016  . Fall 05/01/2016  . Unintentional weight loss 12/30/2015  . Leg weakness, bilateral 12/30/2015  . Hyperkalemia 01/01/2015  . Occasional numbness/prickling/tingling of fingers and toes 12/18/2014  . Routine general medical examination at a health care facility 12/13/2013  . Macrocytic anemia 05/19/2013  . 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 Psoriasis   . Osteoarthritis, multiple sites   . Episodic mood disorder ([place]) 12/01/2011  . History of breast cancer 10/28/2011  .  GERD (gastroesophageal reflux disease)   . Hyperlipidemia   . Hypertension   . Osteopenia   . Occlusion and stenosis of carotid artery without mention of cerebral infarction 05/27/2011   Phillips Grout PT, DPT   Huprich,Jason 10/29/2016, 2:08 PM  [facility] [address], [place], [address] Phone: [phone]   Fax:  [phone]  Name: [name] MRN: [id number] Date of Birth: 15-Feb-1941

## 2016-11-03 ENCOUNTER — Ambulatory Visit (INDEPENDENT_AMBULATORY_CARE_PROVIDER_SITE_OTHER): Payer: Medicare Other | Admitting: Family Medicine

## 2016-11-03 ENCOUNTER — Encounter: Payer: Self-pay | Admitting: Family Medicine

## 2016-11-03 VITALS — BP 112/62 | HR 59 | Temp 97.5°F | Wt 114.0 lb

## 2016-11-03 DIAGNOSIS — W19XXXD Unspecified fall, subsequent encounter: Secondary | ICD-10-CM

## 2016-11-03 DIAGNOSIS — Z5181 Encounter for therapeutic drug level monitoring: Secondary | ICD-10-CM | POA: Diagnosis not present

## 2016-11-03 DIAGNOSIS — F39 Unspecified mood [affective] disorder: Secondary | ICD-10-CM | POA: Diagnosis not present

## 2016-11-03 DIAGNOSIS — R634 Abnormal weight loss: Secondary | ICD-10-CM

## 2016-11-03 LAB — COMPREHENSIVE METABOLIC PANEL
ALT: 14 U/L (ref 0–35)
AST: 23 U/L (ref 0–37)
Albumin: 4.7 g/dL (ref 3.5–5.2)
Alkaline Phosphatase: 57 U/L (ref 39–117)
BUN: 16 mg/dL (ref 6–23)
CO2: 29 mEq/L (ref 19–32)
Calcium: 10.2 mg/dL (ref 8.4–10.5)
Chloride: 96 mEq/L (ref 96–112)
Creatinine, Ser: 0.68 mg/dL (ref 0.40–1.20)
GFR: 89.03 mL/min (ref >60.00)
Glucose, Bld: 92 mg/dL (ref 70–99)
Potassium: 4.5 mEq/L (ref 3.5–5.1)
Sodium: 134 mEq/L — ABNORMAL LOW (ref 135–145)
Total Bilirubin: 0.7 mg/dL (ref 0.2–1.2)
Total Protein: 7.5 g/dL (ref 6.0–8.3)

## 2016-11-03 MED ORDER — CITALOPRAM HYDROBROMIDE 20 MG PO TABS: 40.0000 mg | ORAL_TABLET | Freq: Every day | ORAL | 3 refills | Status: DC

## 2016-11-03 NOTE — Progress Notes (Signed)
[name], MD Phone: [phone]  [name] is a 76 y.o. female who presents today for f/u.  Depression: Notes this is not improved at all. She feels overwhelmed. Things seem so monumental to her. Some anxiety. Taking Celexa. Taking lorazepam as well. Does not make her drowsy. No SI or HI. She just feels out of whack caring for her husband with Alzheimer's.  Patient notes her weight is down a little bit from previously. Her appetite has not been good. She's not been eating much. She does not eat breakfast. Nothing looks good to her. She only has small amount of soup and half a sandwich for lunch. Does not always eat all of her dinner as her husband doesn't always eat everything. She notes no night sweats. She's not had any cough or congestion. No cough productive of blood. Mammogram was normal. Last colonoscopy with no polyps. She is status post total hysterectomy and BSO. She did smoke for about 45 years though quit 20 years ago.  Falls: Notes no recent falls. Physical therapy has been beneficial though she has not had good luck being able to complete PT exercises at home.  PMH: Former smoker   ROS see history of present illness  Objective  Physical Exam Vitals:   11/03/16 1000  BP: 112/62  Pulse: (!) 59  Temp: 97.5 F (36.4 C)    BP Readings from Last 3 Encounters:  11/03/16 112/62  09/21/16 136/90  09/11/16 118/72   Wt Readings from Last 3 Encounters:  11/03/16 114 lb (51.7 kg)  09/21/16 116 lb 12.8 oz (53 kg)  09/11/16 117 lb (53.1 kg)    Physical Exam  Constitutional: No distress.  HENT:  Head: Normocephalic and atraumatic.  Mouth/Throat: Oropharynx is clear and moist.  Neck: Neck supple.  Cardiovascular: Normal rate, regular rhythm and normal heart sounds.   Pulmonary/Chest: Effort normal and breath sounds normal.  Musculoskeletal: She exhibits no edema.  Lymphadenopathy:       Head (right side): No submental and no submandibular adenopathy  present.       Head (left side): No submental and no submandibular adenopathy present.    She has no cervical adenopathy.    She has no axillary adenopathy.       Right: No supraclavicular adenopathy present.       Left: No supraclavicular adenopathy present.  Neurological: She is alert. Gait normal.  Skin: Skin is warm and dry. She is not diaphoretic.     Assessment/Plan: Please see individual problem list.  Fall No recurrent falls. She has done well with physical therapy.  Episodic mood disorder No improvement. Still feels depressed. This is reflected in her report and in the PHQ 9. Suspect this is the cause of her weight loss as it has led to decreased appetite. We will increase her Celexa to 40 mg. We'll check lab work today. She'll follow-up in 6 weeks. She is given return precautions.  Unintentional weight loss Weight is down 2 pounds. Down about 10 pounds over the last year and a half. Lab work previously was unremarkable. Mammogram is normal. Colonoscopy most recently with no polyps. She is status post total hysterectomy and BSO. Symptoms most likely related to her anxiety and depression and we are working on treating this. I did discuss given her smoking history obtaining a chest x-ray though she declined this today and opted to continue to monitor. Discussed getting 3 good meals a day with high calorie foods and also meal supplementation shakes in  between meals.   Orders Placed This Encounter  Procedures  . Comp Met (CMET)    Meds ordered this encounter  Medications  . citalopram (CELEXA) 20 MG tablet    Sig: Take 2 tablets (40 mg total) by mouth daily.    Dispense:  60 tablet    Refill:  [place], MD [place]

## 2016-11-03 NOTE — Assessment & Plan Note (Signed)
Weight is down 2 pounds. Down about 10 pounds over the last year and a half. Lab work previously was unremarkable. Mammogram is normal. Colonoscopy most recently with no polyps. She is status post total hysterectomy and BSO. Symptoms most likely related to her anxiety and depression and we are working on treating this. I did discuss given her smoking history obtaining a chest x-ray though she declined this today and opted to continue to monitor. Discussed getting 3 good meals a day with high calorie foods and also meal supplementation shakes in between meals.

## 2016-11-03 NOTE — Progress Notes (Signed)
Pre visit review using our clinic review tool, if applicable. No additional management support is needed unless otherwise documented below in the visit note. 

## 2016-11-03 NOTE — Patient Instructions (Signed)
Nice to see you. We will increase your Celexa to 40 mg. Please continue physical therapy. Please try to eat 3 good meals a day. You can supplement with boost or in sure. We'll continue to monitor your weight.

## 2016-11-03 NOTE — Assessment & Plan Note (Signed)
No recurrent falls. She has done well with physical therapy.

## 2016-11-03 NOTE — Assessment & Plan Note (Signed)
No improvement. Still feels depressed. This is reflected in her report and in the PHQ 9. Suspect this is the cause of her weight loss as it has led to decreased appetite. We will increase her Celexa to 40 mg. We'll check lab work today. She'll follow-up in 6 weeks. She is given return precautions.

## 2016-11-04 ENCOUNTER — Ambulatory Visit: Payer: Medicare Other | Admitting: Physical Therapy

## 2016-11-04 DIAGNOSIS — R2681 Unsteadiness on feet: Secondary | ICD-10-CM | POA: Diagnosis not present

## 2016-11-04 DIAGNOSIS — M6281 Muscle weakness (generalized): Secondary | ICD-10-CM | POA: Diagnosis not present

## 2016-11-04 DIAGNOSIS — Z9181 History of falling: Secondary | ICD-10-CM

## 2016-11-04 NOTE — Patient Instructions (Addendum)
TRX sit to stands with cuing for moving as fast as possible 3 sets x 8 repetitions   BOSU  Step ups - with 1 HHA x10 for 2 sets with marching movement on contralateral movement   BP - 188/82 HR - 63   After 3 minutes of rest -- 107/82 HR - 60   Leg Press 35# x 6 repetitions for 3 sets   BOSU standing hip abductions x 12 for 2 sets (BP in between to 171/66 - HR - 59)  BP- 119/64

## 2016-11-04 NOTE — Therapy (Signed)
[facility] [address] [place], [address] Phone: [phone]   Fax:  [phone]  Physical Therapy Treatment  Patient Details  Name: [name] MRN: [id number] Date of Birth: 1941/04/22 Referring Provider: Dr. Caryl Bis  Encounter Date: 11/04/2016      PT End of Session - 11/04/16 0932    Visit Number 4   Number of Visits 17   Date for PT Re-Evaluation 2017-01-12   Authorization Type g codes 11/26/2022   PT Start Time 0919   PT Stop Time 1001   PT Time Calculation (min) 42 min   Equipment Utilized During Treatment Gait belt   Activity Tolerance Patient tolerated treatment well   Behavior During Therapy [facility] for tasks assessed/performed      Past Medical History:  Diagnosis Date  . Breast cancer ([place]) 1999/11/15   left breast  . Cancer ([place]) 1999/11/15   left breast ca  . GERD (gastroesophageal reflux disease)   . Hyperlipidemia   . Hypertension   . Osteoarthritis, multiple sites   . Osteoarthrosis involving, or with mention of more than one site, but not specified as generalized, multiple sites   . Osteopenia   . Personal history of radiation therapy 1999-11-15   left breast ca  . Psoriasis     Past Surgical History:  Procedure Laterality Date  . ABDOMINAL HYSTERECTOMY    . ANKLE FRACTURE SURGERY  4/08   left---hardware still in place  . BREAST BIOPSY Left 15-Nov-1999   breast ca  . BREAST EXCISIONAL BIOPSY Left yrs ago   benign  . BREAST LUMPECTOMY  1999-11-15   left  . CAROTID ENDARTERECTOMY Left 10/23/2004  . FRACTURE SURGERY    . SHOULDER SURGERY  6/07   left  . TONSILLECTOMY AND ADENOIDECTOMY    . TOTAL HIP ARTHROPLASTY  Nov 15, 2002   right    There were no vitals filed for this visit.      Subjective Assessment - 11/04/16 0921    Subjective Patient reports she fell about 2 months ago. Patient has been completing HEP (primarily static balance and standing hip abductions).    Pertinent History Pt reports  that she has noticed worsening of her balance recently. She has had approximately 4 falls in the last 6 months. No known pattern to her falls. Pt reports that she gets in a rush and then she falls. Denies dizziness. Denies motion sensitivity. She has hardware in her L ankle from a fracture of both her tibia and fibula secondary to a fall. She notices worse balance in the AM. 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 No/denies      TRX sit to stands with cuing for moving as fast as possible 3 sets x 8 repetitions   BOSU  Step ups - with 1 HHA x10 for 2 sets with marching movement on contralateral movement   BP - 188/82 HR - 63   After 3 minutes of rest -- 107/82 HR - 60   Leg Press 35# x 6 repetitions for 3 sets   BOSU standing hip abductions x 12 for 2 sets (BP in between to 171/66 - HR - 59)  BP- 119/64  PT Education - 11/04/16 0932    Education provided Yes   Education Details That speed/strength are also important in balance (specifically catching yourself if your COM leaves base of support).    Person(s) Educated Patient   Methods Explanation;Demonstration   Comprehension Verbalized understanding;Returned demonstration             PT Long Term Goals - 10/19/16 1523      PT LONG TERM GOAL #1   Title Pt will be independent with HEP in order to improve strength and balance in order to decrease fall risk and improve function at home and work.    Time 8   Period Weeks   Status New     PT LONG TERM GOAL #2   Title Pt will improve BERG by at least 3 points in order to demonstrate clinically significant improvement in balance.     Baseline 10/19/16: 44/56   Time 8   Period Weeks   Status New     PT LONG TERM GOAL #3   Title Pt will be able to perform sit to stand from  regular height chair x 10 without UE support to improve functional independence when raising up from low surfaces such as commode   Baseline 10/19/16: unable to perform sit to stand without UE support   Time 8   Period Weeks   Status New     PT LONG TERM GOAL #4   Title Pt will improve ABC by at least 13% in order to demonstrate clinically significant improvement in balance confidence.    Baseline 10/19/16: 46.88%   Time 8   Period Weeks   Status New               Plan - 11/04/16 0933    Clinical Impression Statement Progressed patient with more speed/power based training on this date as she has been completing static center of gravity management exercises at home. She demonstrates significant LE weakness (Leg press at 35#), which would impair her ability to generate or dissipate forces quickly, leading to increased falls risk. She would benefit from additional strength and power based training to address functional power deficits.    Rehab Potential Good   Clinical Impairments Affecting Rehab Potential Positive: motivation; Negative: possible decreased foot sensation, age   PT Frequency 2x / week   PT Duration 8 weeks   PT Treatment/Interventions ADLs/Self Care Home Management;Aquatic Therapy;Biofeedback;DME Instruction;Gait training;Stair training;Functional mobility training;Therapeutic activities;Therapeutic exercise;Balance training;Neuromuscular re-education;Patient/family education;Passive range of motion;Energy conservation   PT Next Visit Plan Progress balance and strengthening exercises,    PT Home Exercise Plan modified tandem balance, sit to stand from elevated chair without UE support, standing yellow tband hip abduction, hooklying bridges   Consulted and Agree with Plan of Care Patient      Patient will benefit from skilled therapeutic intervention in order to improve the following deficits and impairments:  Abnormal gait, Pain, Decreased balance  Visit  Diagnosis: Unsteadiness on feet  Muscle weakness (generalized)  History of falling     Problem List Patient Active Problem List   Diagnosis Date Noted  . Diarrhea 05/18/2016  . Fall 05/01/2016  . Unintentional weight loss 12/30/2015  . Leg weakness, bilateral 12/30/2015  . Hyperkalemia 01/01/2015  . Occasional numbness/prickling/tingling of fingers and toes 12/18/2014  . Routine general medical examination at a health care facility 12/13/2013  . Macrocytic anemia 05/19/2013  . 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 Psoriasis   . Osteoarthritis, multiple sites   .  Episodic mood disorder ([place]) 12/01/2011  . History of breast cancer 10/28/2011  . GERD (gastroesophageal reflux disease)   . Hyperlipidemia   . Hypertension   . Osteopenia   . Occlusion and stenosis of carotid artery without mention of cerebral infarction 05/27/2011    Royce Macadamia PT, DPT, CSCS    11/04/2016, 11:06 AM  [facility] [address] [place], [address] Phone: [phone]   Fax:  [phone]  Name: SHERITA DECOSTE MRN: [id number] Date of Birth: 1941-07-18

## 2016-11-06 ENCOUNTER — Telehealth: Payer: Self-pay

## 2016-11-06 NOTE — Telephone Encounter (Signed)
error 

## 2016-11-06 NOTE — Telephone Encounter (Signed)
Patient advised of below and verbalized an understanding .  Patient wants to wait until  12/21/16  To re-check sodium level is that ok. Please advise.  Thanks.

## 2016-11-06 NOTE — Telephone Encounter (Signed)
Left voice mail to call back 

## 2016-11-06 NOTE — Telephone Encounter (Signed)
-----   Message from [name], MD sent at 11/05/2016  2:10 PM EDT ----- Please let the patient know her sodium is minimally low otherwise her labs are normal. We'll need to recheck this in several weeks after she has been on the increased dose of Celexa. Thanks.

## 2016-11-06 NOTE — Telephone Encounter (Signed)
Patient requested a call [phone]

## 2016-11-06 NOTE — Telephone Encounter (Signed)
That will be fine. 

## 2016-11-06 NOTE — Telephone Encounter (Deleted)
-----   Message from [name], MD sent at 11/05/2016  2:10 PM EDT ----- Please let the patient know her sodium is minimally low otherwise her labs are normal. We'll need to recheck this in several weeks after she has been on the increased dose of Celexa. Thanks.

## 2016-11-09 ENCOUNTER — Ambulatory Visit: Payer: Medicare Other

## 2016-11-09 DIAGNOSIS — M6281 Muscle weakness (generalized): Secondary | ICD-10-CM

## 2016-11-09 DIAGNOSIS — R2681 Unsteadiness on feet: Secondary | ICD-10-CM | POA: Diagnosis not present

## 2016-11-09 DIAGNOSIS — Z9181 History of falling: Secondary | ICD-10-CM | POA: Diagnosis not present

## 2016-11-09 NOTE — Therapy (Signed)
[facility] [address], [place], [address] Phone: [phone]   Fax:  [phone]  Physical Therapy Treatment  Patient Details  Name: [name] MRN: [id number] Date of Birth: [birth year] Referring Provider: Dr. Caryl Bis  Encounter Date: 11/09/2016      PT End of Session - 11/09/16 1143    Visit Number 5   Number of Visits 17   Date for PT Re-Evaluation 2017-01-05   Authorization Type g codes 5/10   PT Start Time 11-07-1116   PT Stop Time 1200   PT Time Calculation (min) 42 min   Equipment Utilized During Treatment Gait belt   Activity Tolerance Patient tolerated treatment well   Behavior During Therapy [facility] for tasks assessed/performed      Past Medical History:  Diagnosis Date  . Breast cancer ([place]) 08-Nov-1999   left breast  . Cancer ([place]) Nov 08, 1999   left breast ca  . GERD (gastroesophageal reflux disease)   . Hyperlipidemia   . Hypertension   . Osteoarthritis, multiple sites   . Osteoarthrosis involving, or with mention of more than one site, but not specified as generalized, multiple sites   . Osteopenia   . Personal history of radiation therapy 11/08/99   left breast ca  . Psoriasis     Past Surgical History:  Procedure Laterality Date  . ABDOMINAL HYSTERECTOMY    . ANKLE FRACTURE SURGERY  4/08   left---hardware still in place  . BREAST BIOPSY Left 1999-11-08   breast ca  . BREAST EXCISIONAL BIOPSY Left yrs ago   benign  . BREAST LUMPECTOMY  11-08-1999   left  . CAROTID ENDARTERECTOMY Left 10/23/2004  . FRACTURE SURGERY    . SHOULDER SURGERY  6/07   left  . TONSILLECTOMY AND ADENOIDECTOMY    . TOTAL HIP ARTHROPLASTY  2002-11-08   right    There were no vitals filed for this visit.      Subjective Assessment - 11/09/16 1123    Subjective Patient reports no major changes since the previous visit. Patient reports she is doing well today.    Pertinent History Pt reports that she has noticed worsening of  her balance recently. She has had approximately 4 falls in the last 6 months. No known pattern to her falls. Pt reports that she gets in a rush and then she falls. Denies dizziness. Denies motion sensitivity. She has hardware in her L ankle from a fracture of both her tibia and fibula secondary to a fall. She notices worse balance in the AM. 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 No/denies        Therapeutic Exercise: Side stepping on airex beam - x7 down/back with intermittent UE support Nustep with level 3 with cueing on speed of performance - 65min Tandem ambulation across airex beam - x5 down and back  Semi tandem stance with body rotations - x 30 B sides Step taps from airex pad onto 8 in step - x 25  Sit to stands with use of TRX straps - x20 Step ups with unilateral UE support - x 20 B Les Side stepping with RTB - 76ft x 2 B        PT Education - 11/09/16 1136    Education provided Yes   Education Details Form/technique with  exercise performance   Person(s) Educated Patient   Methods Explanation;Demonstration   Comprehension Verbalized understanding;Returned demonstration             PT Long Term Goals - 10/19/16 1523      PT LONG TERM GOAL #1   Title Pt will be independent with HEP in order to improve strength and balance in order to decrease fall risk and improve function at home and work.    Time 8   Period Weeks   Status New     PT LONG TERM GOAL #2   Title Pt will improve BERG by at least 3 points in order to demonstrate clinically significant improvement in balance.     Baseline 10/19/16: 44/56   Time 8   Period Weeks   Status New     PT LONG TERM GOAL #3   Title Pt will be able to perform sit to stand from regular height chair x 10 without UE support to improve functional independence when raising up  from low surfaces such as commode   Baseline 10/19/16: unable to perform sit to stand without UE support   Time 8   Period Weeks   Status New     PT LONG TERM GOAL #4   Title Pt will improve ABC by at least 13% in order to demonstrate clinically significant improvement in balance confidence.    Baseline 10/19/16: 46.88%   Time 8   Period Weeks   Status New               Plan - 11/09/16 1153    Clinical Impression Statement Patient demonstrates decreased hip ER/abduction with performing step ups indicating increased weakness in the lateral hip muscular. Patient demonstrates decreased quadriceps strength and balance as indicated by increased postural sway with exercise and patient will benefit from furter skilled therapy to return to prior level of function.    Rehab Potential Good   Clinical Impairments Affecting Rehab Potential Positive: motivation; Negative: possible decreased foot sensation, age   PT Frequency 2x / week   PT Duration 8 weeks   PT Treatment/Interventions ADLs/Self Care Home Management;Aquatic Therapy;Biofeedback;DME Instruction;Gait training;Stair training;Functional mobility training;Therapeutic activities;Therapeutic exercise;Balance training;Neuromuscular re-education;Patient/family education;Passive range of motion;Energy conservation   PT Next Visit Plan Progress balance and strengthening exercises,    PT Home Exercise Plan modified tandem balance, sit to stand from elevated chair without UE support, standing yellow tband hip abduction, hooklying bridges   Consulted and Agree with Plan of Care Patient      Patient will benefit from skilled therapeutic intervention in order to improve the following deficits and impairments:  Abnormal gait, Pain, Decreased balance  Visit Diagnosis: Unsteadiness on feet  Muscle weakness (generalized)  History of falling     Problem List Patient Active Problem List   Diagnosis Date Noted  . Diarrhea 05/18/2016  . Fall  05/01/2016  . Unintentional weight loss 12/30/2015  . Leg weakness, bilateral 12/30/2015  . Hyperkalemia 01/01/2015  . Occasional numbness/prickling/tingling of fingers and toes 12/18/2014  . Routine general medical examination at a health care facility 12/13/2013  . Macrocytic anemia 05/19/2013  . 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 Psoriasis   . Osteoarthritis, multiple sites   . Episodic mood disorder ([place]) 12/01/2011  . History of breast cancer 10/28/2011  . GERD (gastroesophageal reflux disease)   . Hyperlipidemia   . Hypertension   . Osteopenia   . Occlusion and stenosis of carotid artery without mention of cerebral infarction 05/27/2011  Blythe Stanford, PT DPT 11/09/2016, 12:07 PM  [facility] [address], [place], [address] Phone: [phone]   Fax:  [phone]  Name: [name] MRN: [id number] Date of Birth: [birth year]

## 2016-11-10 ENCOUNTER — Encounter: Payer: Self-pay | Admitting: Physical Therapy

## 2016-11-11 ENCOUNTER — Ambulatory Visit: Payer: Medicare Other | Admitting: Physical Therapy

## 2016-11-12 ENCOUNTER — Ambulatory Visit: Payer: Medicare Other

## 2016-11-12 DIAGNOSIS — R2681 Unsteadiness on feet: Secondary | ICD-10-CM | POA: Diagnosis not present

## 2016-11-12 DIAGNOSIS — Z9181 History of falling: Secondary | ICD-10-CM

## 2016-11-12 DIAGNOSIS — M6281 Muscle weakness (generalized): Secondary | ICD-10-CM | POA: Diagnosis not present

## 2016-11-12 NOTE — Therapy (Signed)
[facility] [address], [place], [address] Phone: [phone]   Fax:  [phone]  Physical Therapy Treatment  Patient Details  Name: [name] MRN: [id number] Date of Birth: [birth year] Referring Provider: Dr. Caryl Bis  Encounter Date: 11/12/2016      PT End of Session - 11/12/16 1439    Visit Number 6   Number of Visits 17   Date for PT Re-Evaluation 2017-01-13   Authorization Type g codes 6/10   PT Start Time 1430   PT Stop Time 1515   PT Time Calculation (min) 45 min   Equipment Utilized During Treatment Gait belt   Activity Tolerance Patient tolerated treatment well   Behavior During Therapy [facility] for tasks assessed/performed      Past Medical History:  Diagnosis Date  . Breast cancer ([place]) 2001   left breast  . Cancer ([place]) 2001   left breast ca  . GERD (gastroesophageal reflux disease)   . Hyperlipidemia   . Hypertension   . Osteoarthritis, multiple sites   . Osteoarthrosis involving, or with mention of more than one site, but not specified as generalized, multiple sites   . Osteopenia   . Personal history of radiation therapy 2001   left breast ca  . Psoriasis     Past Surgical History:  Procedure Laterality Date  . ABDOMINAL HYSTERECTOMY    . ANKLE FRACTURE SURGERY  4/08   left---hardware still in place  . 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 BREAST EXCISIONAL BIOPSY Left yrs ago   benign  . BREAST LUMPECTOMY  2001   left  . CAROTID ENDARTERECTOMY Left 10/23/2004  . FRACTURE SURGERY    . SHOULDER SURGERY  6/07   left  . TONSILLECTOMY AND ADENOIDECTOMY    . TOTAL HIP ARTHROPLASTY  2004   right    There were no vitals filed for this visit.      Subjective Assessment - 11/12/16 1435    Subjective Patient reports no major changes since the previous visit; Patient reports no increased soreness since her previous visit.    Pertinent History Pt reports that she has  noticed worsening of her balance recently. She has had approximately 4 falls in the last 6 months. No known pattern to her falls. Pt reports that she gets in a rush and then she falls. Denies dizziness. Denies motion sensitivity. She has hardware in her L ankle from a fracture of both her tibia and fibula secondary to a fall. She notices worse balance in the AM. 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 No/denies        Therapeutic Exercise: Side stepping on airex beam - x8 down/back with intermittent UE support Nustep with level 5 with cueing on speed of performance and use of UEs- 71min Sit to stands with therapist assist from chair with airex on top - 2 x 10 and GTB around knees Mini Squats on airex beam - with minimal UE support - 2 x 10  Hip machine - hip abduction - 2 x 15 with cueing on hip and UE positioning. Heel strike from airex pad onto 6" - x15 B  Side stepping up onto 6" B - 2 x10 Foot slides with intermittent UE support - 2 x 10 B  PT Education - 11/12/16 1438    Education provided Yes   Education Details form/technique with exercise   Person(s) Educated Patient   Methods Demonstration;Explanation   Comprehension Returned demonstration;Verbalized understanding             PT Long Term Goals - 10/19/16 1523      PT LONG TERM GOAL #1   Title Pt will be independent with HEP in order to improve strength and balance in order to decrease fall risk and improve function at home and work.    Time 8   Period Weeks   Status New     PT LONG TERM GOAL #2   Title Pt will improve BERG by at least 3 points in order to demonstrate clinically significant improvement in balance.     Baseline 10/19/16: 44/56   Time 8   Period Weeks   Status New     PT LONG TERM GOAL #3   Title Pt will be able to perform sit to stand  from regular height chair x 10 without UE support to improve functional independence when raising up from low surfaces such as commode   Baseline 10/19/16: unable to perform sit to stand without UE support   Time 8   Period Weeks   Status New     PT LONG TERM GOAL #4   Title Pt will improve ABC by at least 13% in order to demonstrate clinically significant improvement in balance confidence.    Baseline 10/19/16: 46.88%   Time 8   Period Weeks   Status New               Plan - 11/12/16 1457    Clinical Impression Statement Patient demonstrates improvement in ER/abduction requiring less cueing to correct versus previous treatment indicating functional carryover between visits. Although pt is improving, she continues to demonstrate decreased static balance and muscular strength/endurance and will benefit from further skilled therapy to retun to prior level of function.    Rehab Potential Good   Clinical Impairments Affecting Rehab Potential Positive: motivation; Negative: possible decreased foot sensation, age   PT Frequency 2x / week   PT Duration 8 weeks   PT Treatment/Interventions ADLs/Self Care Home Management;Aquatic Therapy;Biofeedback;DME Instruction;Gait training;Stair training;Functional mobility training;Therapeutic activities;Therapeutic exercise;Balance training;Neuromuscular re-education;Patient/family education;Passive range of motion;Energy conservation   PT Next Visit Plan Progress balance and strengthening exercises,    PT Home Exercise Plan modified tandem balance, sit to stand from elevated chair without UE support, standing yellow tband hip abduction, hooklying bridges   Consulted and Agree with Plan of Care Patient      Patient will benefit from skilled therapeutic intervention in order to improve the following deficits and impairments:  Abnormal gait, Pain, Decreased balance  Visit Diagnosis: Unsteadiness on feet  Muscle weakness (generalized)  History of  falling     Problem List Patient Active Problem List   Diagnosis Date Noted  . Diarrhea 05/18/2016  . Fall 05/01/2016  . Unintentional weight loss 12/30/2015  . Leg weakness, bilateral 12/30/2015  . Hyperkalemia 01/01/2015  . Occasional numbness/prickling/tingling of fingers and toes 12/18/2014  . Routine general medical examination at a health care facility 12/13/2013  . Macrocytic anemia 05/19/2013  . 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 Psoriasis   . Osteoarthritis, multiple sites   . Episodic mood disorder ([place]) 12/01/2011  . History of breast cancer 10/28/2011  . GERD (gastroesophageal reflux disease)   . Hyperlipidemia   . Hypertension   . Osteopenia   .  Occlusion and stenosis of carotid artery without mention of cerebral infarction 05/27/2011    Blythe Stanford, PT DPT 11/12/2016, 3:11 PM  [facility] [address], [place], [address] Phone: [phone]   Fax:  [phone]  Name: [name] MRN: [id number] Date of Birth: [birth year]

## 2016-11-16 ENCOUNTER — Ambulatory Visit: Payer: Medicare Other | Admitting: Physical Therapy

## 2016-11-18 ENCOUNTER — Ambulatory Visit: Payer: Medicare Other | Admitting: Physical Therapy

## 2016-11-18 ENCOUNTER — Ambulatory Visit: Payer: Medicare Other | Attending: Family Medicine

## 2016-11-18 DIAGNOSIS — Z9181 History of falling: Secondary | ICD-10-CM | POA: Diagnosis not present

## 2016-11-18 DIAGNOSIS — R2681 Unsteadiness on feet: Secondary | ICD-10-CM | POA: Insufficient documentation

## 2016-11-18 DIAGNOSIS — M6281 Muscle weakness (generalized): Secondary | ICD-10-CM | POA: Diagnosis not present

## 2016-11-18 NOTE — Therapy (Signed)
[facility] [address] [place], [address] Phone: [phone]   Fax:  [phone]  Physical Therapy Treatment  Patient Details  Name: [name] MRN: [id number] Date of Birth: [birth year] Referring Provider: Dr. Caryl Bis  Encounter Date: 11/18/2016      PT End of Session - 11/18/16 1028    Visit Number 7   Number of Visits 17   Date for PT Re-Evaluation 01-05-17   Authorization Type g codes 7/10   PT Start Time 11-07-16   PT Stop Time 1100   PT Time Calculation (min) 42 min   Equipment Utilized During Treatment Gait belt   Activity Tolerance Patient tolerated treatment well   Behavior During Therapy [facility] for tasks assessed/performed      Past Medical History:  Diagnosis Date  . Breast cancer ([place]) November 08, 1999   left breast  . Cancer ([place]) 1999/11/08   left breast ca  . GERD (gastroesophageal reflux disease)   . Hyperlipidemia   . Hypertension   . Osteoarthritis, multiple sites   . Osteoarthrosis involving, or with mention of more than one site, but not specified as generalized, multiple sites   . Osteopenia   . Personal history of radiation therapy 11/08/99   left breast ca  . Psoriasis     Past Surgical History:  Procedure Laterality Date  . ABDOMINAL HYSTERECTOMY    . ANKLE FRACTURE SURGERY  4/08   left---hardware still in place  . BREAST BIOPSY Left November 08, 1999   breast ca  . BREAST EXCISIONAL BIOPSY Left yrs ago   benign  . BREAST LUMPECTOMY  1999/11/08   left  . CAROTID ENDARTERECTOMY Left 10/23/2004  . FRACTURE SURGERY    . SHOULDER SURGERY  6/07   left  . TONSILLECTOMY AND ADENOIDECTOMY    . TOTAL HIP ARTHROPLASTY  11/08/02   right    There were no vitals filed for this visit.      Subjective Assessment - 11/18/16 1026    Subjective Patient reports no major changes since the previous visit and states no increased pain or soreness after the previous treatment.    Pertinent History Pt reports that  she has noticed worsening of her balance recently. She has had approximately 4 falls in the last 6 months. No known pattern to her falls. Pt reports that she gets in a rush and then she falls. Denies dizziness. Denies motion sensitivity. She has hardware in her L ankle from a fracture of both her tibia and fibula secondary to a fall. She notices worse balance in the AM. 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 No/denies        Therapeutic Exercise: Side stepping on airex beam - x8 down/back with intermittent UE support Nustep with level 5 with cueing on speed of performance and use of UEs- 4min Hip machine - hip abduction/ extension - 2 x 10 with cueing on hip and UE positioning 40# Step ups onto 8" step with B LE - 2 x 10 with B LEs Sit to stands with therapist assist from chair with airex on top - 2 x 10 and GTB around knees Seated clamshells with GTB - 2 x 15 Walking across balance stones forward/backward - x 5 (6 stones) Single leg hip abduction with stance leg on balance stone --  2 x 10        PT Education - 11/18/16 1027    Education provided Yes   Education Details Form/technique with exercise   Person(s) Educated Patient   Methods Explanation;Demonstration   Comprehension Verbalized understanding;Returned demonstration             PT Long Term Goals - 10/19/16 1523      PT LONG TERM GOAL #1   Title Pt will be independent with HEP in order to improve strength and balance in order to decrease fall risk and improve function at home and work.    Time 8   Period Weeks   Status New     PT LONG TERM GOAL #2   Title Pt will improve BERG by at least 3 points in order to demonstrate clinically significant improvement in balance.     Baseline 10/19/16: 44/56   Time 8   Period Weeks   Status New     PT LONG TERM GOAL #3    Title Pt will be able to perform sit to stand from regular height chair x 10 without UE support to improve functional independence when raising up from low surfaces such as commode   Baseline 10/19/16: unable to perform sit to stand without UE support   Time 8   Period Weeks   Status New     PT LONG TERM GOAL #4   Title Pt will improve ABC by at least 13% in order to demonstrate clinically significant improvement in balance confidence.    Baseline 10/19/16: 46.88%   Time 8   Period Weeks   Status New               Plan - 11/18/16 1035    Clinical Impression Statement Patient demonstrates decreased strength and focused on improving strength and power to improve reactionary forces when patient has loss of balance in standing. Patient continues to demonstrate hip IR and ADD with squatting motions and patient will benefit from further skilled therapy to return to prior level of function.     Rehab Potential Good   Clinical Impairments Affecting Rehab Potential Positive: motivation; Negative: possible decreased foot sensation, age   PT Frequency 2x / week   PT Duration 8 weeks   PT Treatment/Interventions ADLs/Self Care Home Management;Aquatic Therapy;Biofeedback;DME Instruction;Gait training;Stair training;Functional mobility training;Therapeutic activities;Therapeutic exercise;Balance training;Neuromuscular re-education;Patient/family education;Passive range of motion;Energy conservation   PT Next Visit Plan Progress balance and strengthening exercises,    PT Home Exercise Plan modified tandem balance, sit to stand from elevated chair without UE support, standing yellow tband hip abduction, hooklying bridges   Consulted and Agree with Plan of Care Patient      Patient will benefit from skilled therapeutic intervention in order to improve the following deficits and impairments:  Abnormal gait, Pain, Decreased balance  Visit Diagnosis: Unsteadiness on feet  Muscle weakness  (generalized)  History of falling     Problem List Patient Active Problem List   Diagnosis Date Noted  . Diarrhea 05/18/2016  . Fall 05/01/2016  . Unintentional weight loss 12/30/2015  . Leg weakness, bilateral 12/30/2015  . Hyperkalemia 01/01/2015  . Occasional numbness/prickling/tingling of fingers and toes 12/18/2014  . Routine general medical examination at a health care facility 12/13/2013  . Macrocytic anemia 05/19/2013  . 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 Psoriasis   . Osteoarthritis, multiple sites   . Episodic mood disorder ([place]) 12/01/2011  . History of breast cancer 10/28/2011  . GERD (gastroesophageal reflux  disease)   . Hyperlipidemia   . Hypertension   . Osteopenia   . Occlusion and stenosis of carotid artery without mention of cerebral infarction 05/27/2011    Blythe Stanford, PT DPT 11/18/2016, 10:52 AM  [facility] [address], [place], [address] Phone: [phone]   Fax:  [phone]  Name: [name] MRN: [id number] Date of Birth: Jul 19, 1941

## 2016-11-19 ENCOUNTER — Encounter: Payer: Self-pay | Admitting: Physical Therapy

## 2016-11-20 ENCOUNTER — Other Ambulatory Visit: Payer: Self-pay | Admitting: *Deleted

## 2016-11-20 MED ORDER — LORAZEPAM 0.5 MG PO TABS: ORAL_TABLET | ORAL | 0 refills | Status: DC

## 2016-11-20 NOTE — Telephone Encounter (Signed)
Last OV 11/03/16 last filled 08/27/16  60 0rf

## 2016-11-20 NOTE — Telephone Encounter (Signed)
Medication Refill requested for :Lorazepam  Pharmacy:[facility] [address]  Return Contact : [phone]

## 2016-11-20 NOTE — Telephone Encounter (Signed)
faxed

## 2016-11-23 ENCOUNTER — Ambulatory Visit: Payer: Medicare Other

## 2016-11-23 DIAGNOSIS — Z9181 History of falling: Secondary | ICD-10-CM | POA: Diagnosis not present

## 2016-11-23 DIAGNOSIS — R2681 Unsteadiness on feet: Secondary | ICD-10-CM | POA: Diagnosis not present

## 2016-11-23 DIAGNOSIS — M6281 Muscle weakness (generalized): Secondary | ICD-10-CM | POA: Diagnosis not present

## 2016-11-23 NOTE — Therapy (Signed)
[facility] [address] [place], [address] Phone: [phone]   Fax:  [phone]  Physical Therapy Treatment  Patient Details  Name: [name] MRN: [id number] Date of Birth: [birth year] Referring Provider: Dr. Caryl Bis  Encounter Date: 11/23/2016      PT End of Session - 11/23/16 0915    Visit Number 8   Number of Visits 17   Date for PT Re-Evaluation 18-Dec-2016   Authorization Type g codes 8/10   PT Start Time 0830   PT Stop Time 0845   PT Time Calculation (min) 15 min   Equipment Utilized During Treatment Gait belt   Activity Tolerance Patient tolerated treatment well   Behavior During Therapy [facility] for tasks assessed/performed      Past Medical History:  Diagnosis Date  . Breast cancer ([place]) 2001   left breast  . Cancer ([place]) 2001   left breast ca  . GERD (gastroesophageal reflux disease)   . Hyperlipidemia   . Hypertension   . Osteoarthritis, multiple sites   . Osteoarthrosis involving, or with mention of more than one site, but not specified as generalized, multiple sites   . Osteopenia   . Personal history of radiation therapy 2001   left breast ca  . Psoriasis     Past Surgical History:  Procedure Laterality Date  . ABDOMINAL HYSTERECTOMY    . ANKLE FRACTURE SURGERY  4/08   left---hardware still in place  . 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 BREAST EXCISIONAL BIOPSY Left yrs ago   benign  . BREAST LUMPECTOMY  2001   left  . CAROTID ENDARTERECTOMY Left 10/23/2004  . FRACTURE SURGERY    . SHOULDER SURGERY  6/07   left  . TONSILLECTOMY AND ADENOIDECTOMY    . TOTAL HIP ARTHROPLASTY  2004   right    There were no vitals filed for this visit.      Subjective Assessment - 11/23/16 0906    Subjective Patient reports she is feeling stronger and her balance is feeling improved today. Patient states no increased soreness after the previous session.    Pertinent History  Pt reports that she has noticed worsening of her balance recently. She has had approximately 4 falls in the last 6 months. No known pattern to her falls. Pt reports that she gets in a rush and then she falls. Denies dizziness. Denies motion sensitivity. She has hardware in her L ankle from a fracture of both her tibia and fibula secondary to a fall. She notices worse balance in the AM. 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 No/denies       Therapeutic Exercise: Side stepping on airex beam - x10 down/back with intermittent UE support Nustep with level 4 with cueing on speed of performance and use of UEs- 33min Semi tandem stance with intermittent UE support - 2 x 55ft Four square circles with YTB around knees - x15; rotational stepping B - x 10  Sit to stands with therapist assist from chair-  x 10 and YTB around knees, x10 with airex on top Hip machine - hip abduction - 2 x 10 with cueing on hip and UE positioning 40# Leg Press for quadriceps strength - 2 x 10 35# Tandem Ambulation without UE support - 6  x 10 forward Step ups onto 8" step with B LE - x 10 with B Les  -Patient demonstrate increased fatigue at end of therapy       PT Education - 11/23/16 0908    Education provided Yes   Education Details form/technique with exercise   Person(s) Educated Patient   Methods Explanation;Demonstration   Comprehension Verbalized understanding;Returned demonstration             PT Long Term Goals - 10/19/16 1523      PT LONG TERM GOAL #1   Title Pt will be independent with HEP in order to improve strength and balance in order to decrease fall risk and improve function at home and work.    Time 8   Period Weeks   Status New     PT LONG TERM GOAL #2   Title Pt will improve BERG by at least 3 points in order to demonstrate  clinically significant improvement in balance.     Baseline 10/19/16: 44/56   Time 8   Period Weeks   Status New     PT LONG TERM GOAL #3   Title Pt will be able to perform sit to stand from regular height chair x 10 without UE support to improve functional independence when raising up from low surfaces such as commode   Baseline 10/19/16: unable to perform sit to stand without UE support   Time 8   Period Weeks   Status New     PT LONG TERM GOAL #4   Title Pt will improve ABC by at least 13% in order to demonstrate clinically significant improvement in balance confidence.    Baseline 10/19/16: 46.88%   Time 8   Period Weeks   Status New               Plan - 11/23/16 0916    Clinical Impression Statement Patient demonstrates improvement in strength requiring less use of UEs to perform sit to stand exercise. Patient demonstrates improved balance and reactionary stepping ability to maintain BOS. Although patient is improving, she demonstrates increased LE weakness and patient will benefit from further skilled therapy to return to prior level of function.    Rehab Potential Good   Clinical Impairments Affecting Rehab Potential Positive: motivation; Negative: possible decreased foot sensation, age   PT Frequency 2x / week   PT Duration 8 weeks   PT Treatment/Interventions ADLs/Self Care Home Management;Aquatic Therapy;Biofeedback;DME Instruction;Gait training;Stair training;Functional mobility training;Therapeutic activities;Therapeutic exercise;Balance training;Neuromuscular re-education;Patient/family education;Passive range of motion;Energy conservation   PT Next Visit Plan Progress balance and strengthening exercises,    PT Home Exercise Plan modified tandem balance, sit to stand from elevated chair without UE support, standing yellow tband hip abduction, hooklying bridges   Consulted and Agree with Plan of Care Patient      Patient will benefit from skilled therapeutic  intervention in order to improve the following deficits and impairments:  Abnormal gait, Pain, Decreased balance  Visit Diagnosis: Unsteadiness on feet  Muscle weakness (generalized)  History of falling     Problem List Patient Active Problem List   Diagnosis Date Noted  . Diarrhea 05/18/2016  . Fall 05/01/2016  . Unintentional weight loss 12/30/2015  . Leg weakness, bilateral 12/30/2015  . Hyperkalemia 01/01/2015  . Occasional numbness/prickling/tingling of fingers and toes 12/18/2014  . Routine general medical examination at a health care facility 12/13/2013  . Macrocytic anemia 05/19/2013  . 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 Psoriasis   .  Osteoarthritis, multiple sites   . Episodic mood disorder ([place]) 12/01/2011  . History of breast cancer 10/28/2011  . GERD (gastroesophageal reflux disease)   . Hyperlipidemia   . Hypertension   . Osteopenia   . Occlusion and stenosis of carotid artery without mention of cerebral infarction 05/27/2011    Blythe Stanford, PT DPT 11/23/2016, 9:22 AM  [facility] [address], [place], [address] Phone: [phone]   Fax:  [phone]  Name: IDA MILBRATH MRN: [id number] Date of Birth: [birth year]

## 2016-11-24 ENCOUNTER — Ambulatory Visit: Payer: Medicare Other

## 2016-11-25 ENCOUNTER — Telehealth: Payer: Self-pay | Admitting: *Deleted

## 2016-11-25 ENCOUNTER — Encounter: Payer: Self-pay | Admitting: Physical Therapy

## 2016-11-25 NOTE — Telephone Encounter (Signed)
Medication Refill requested for : [email or web address]  Pharmacy: [facility]  Return Contact : [phone]

## 2016-11-25 NOTE — Telephone Encounter (Signed)
Last OV 11/03/16 last filled 11/20/16

## 2016-11-25 NOTE — Telephone Encounter (Signed)
Patient notified rx was sent to pharmacy 11/20/16

## 2016-11-26 ENCOUNTER — Ambulatory Visit: Payer: Medicare Other | Admitting: Physical Therapy

## 2016-11-26 DIAGNOSIS — M6281 Muscle weakness (generalized): Secondary | ICD-10-CM | POA: Diagnosis not present

## 2016-11-26 DIAGNOSIS — Z9181 History of falling: Secondary | ICD-10-CM

## 2016-11-26 DIAGNOSIS — R2681 Unsteadiness on feet: Secondary | ICD-10-CM

## 2016-11-26 NOTE — Patient Instructions (Addendum)
TRX sit to stands x 12    Step ups laterally to two steps on RLE, 1 step on LLE x 10 in each condition for 2nd set 1 step on each side   Single leg stance with minA on blue side of BOSU up 2 sets x 15" secnds per side

## 2016-11-26 NOTE — Therapy (Signed)
[facility] [address], [place], [address] Phone: [phone]   Fax:  [phone]  Physical Therapy Treatment  Patient Details  Name: [name] MRN: [id number] Date of Birth: 10-13-1940 Referring Provider: Dr. Caryl Bis  Encounter Date: 11/26/2016      PT End of Session - 11/26/16 1010    Visit Number 9   Number of Visits 17   Date for PT Re-Evaluation 12/28/16   Authorization Type g codes 1/10   PT Start Time 0953   PT Stop Time 1034   PT Time Calculation (min) 41 min   Equipment Utilized During Treatment Gait belt   Activity Tolerance Patient tolerated treatment well   Behavior During Therapy [facility] for tasks assessed/performed      Past Medical History:  Diagnosis Date  . Breast cancer ([place]) 2001   left breast  . Cancer ([place]) 2001   left breast ca  . GERD (gastroesophageal reflux disease)   . Hyperlipidemia   . Hypertension   . Osteoarthritis, multiple sites   . Osteoarthrosis involving, or with mention of more than one site, but not specified as generalized, multiple sites   . Osteopenia   . Personal history of radiation therapy 2001   left breast ca  . Psoriasis     Past Surgical History:  Procedure Laterality Date  . ABDOMINAL HYSTERECTOMY    . ANKLE FRACTURE SURGERY  4/08   left---hardware still in place  . 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 BREAST EXCISIONAL BIOPSY Left yrs ago   benign  . BREAST LUMPECTOMY  2001   left  . CAROTID ENDARTERECTOMY Left 10/23/2004  . FRACTURE SURGERY    . SHOULDER SURGERY  6/07   left  . TONSILLECTOMY AND ADENOIDECTOMY    . TOTAL HIP ARTHROPLASTY  2004   right    There were no vitals filed for this visit.      Subjective Assessment - 11/26/16 1010    Subjective Patient reports no new falls, she has been completing her HEP and feels like she is getting stronger but still relies heavily on her UEs to transition from sit to  stand.   Pertinent History Pt reports that she has noticed worsening of her balance recently. She has had approximately 4 falls in the last 6 months. No known pattern to her falls. Pt reports that she gets in a rush and then she falls. Denies dizziness. Denies motion sensitivity. She has hardware in her L ankle from a fracture of both her tibia and fibula secondary to a fall. She notices worse balance in the AM. 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 No/denies      TRX sit to stands x 12  X 2 sets (challenging on her quads)  Step ups laterally to two steps on RLE, 1 step on LLE x 10 in each condition for 2nd set 1 step on each side   Single leg stance with minA on blue side of BOSU up 2 sets x 15" secnds per side   Supine bridging x 12 (moderate) x 5 per side in single leg (much more challenging)                       [facility] Adult PT Treatment/Exercise -  11/26/16 0001      Berg Balance Test   Sit to Stand Able to stand  independently using hands   Standing Unsupported Able to stand safely 2 minutes   Sitting with Back Unsupported but Feet Supported on Floor or Stool Able to sit safely and securely 2 minutes   Stand to Sit Controls descent by using hands   Transfers Able to transfer safely, definite need of hands   Standing Unsupported with Eyes Closed Able to stand 10 seconds safely   Standing Ubsupported with Feet Together Able to place feet together independently and stand 1 minute safely   From Standing, Reach Forward with Outstretched Arm Can reach confidently >25 cm (10")   From Standing Position, Pick up Object from Floor Able to pick up shoe safely and easily   From Standing Position, Turn to Look Behind Over each Shoulder Looks behind from both sides and weight shifts well   Turn 360 Degrees Able to turn 360  degrees safely in 4 seconds or less   Standing Unsupported, Alternately Place Feet on Step/Stool Able to stand independently and safely and complete 8 steps in 20 seconds   Standing Unsupported, One Foot in Front Needs help to step but can hold 15 seconds   Standing on One Leg Tries to lift leg/unable to hold 3 seconds but remains standing independently   Total Score 47                PT Education - 11/26/16 1019    Education provided Yes   Education Details Progress to single leg bridging exercise.    Person(s) Educated Patient   Methods Explanation;Demonstration   Comprehension Verbalized understanding;Returned demonstration             PT Long Term Goals - 11/26/16 1044      PT LONG TERM GOAL #1   Title Pt will be independent with HEP in order to improve strength and balance in order to decrease fall risk and improve function at home and work.    Time 8   Period Weeks   Status Achieved     PT LONG TERM GOAL #2   Title Pt will improve BERG by at least 3 points in order to demonstrate clinically significant improvement in balance.     Baseline 10/19/16: 44/56, 11/26/16 -- 47/56   Time 8   Period Weeks   Status Achieved     PT LONG TERM GOAL #3   Title Pt will be able to perform sit to stand from regular height chair x 10 without UE support to improve functional independence when raising up from low surfaces such as commode   Baseline 10/19/16: unable to perform sit to stand without UE support -- 5/10 still unable to complete 1 without use of UEs.    Time 8   Period Weeks   Status On-going     PT LONG TERM GOAL #4   Title Pt will improve ABC by at least 13% in order to demonstrate clinically significant improvement in balance confidence.    Baseline 10/19/16: 46.88%   Time 8   Period Weeks   Status On-going               Plan - 11/26/16 1010    Clinical Impression Statement Patient was re-evaluated this date, noted to have improvement in Berg Balance  score from 44 to 47. Her deficits currently are in narrow base of support and single leg stance, likely a combination of  neuropathy in her feet and decreased LE strength inhibiting her from appropriately adjusting her COM over BOS. She would continue to benefit from skilled PT services to address the listed deficits.    Rehab Potential Good   Clinical Impairments Affecting Rehab Potential Positive: motivation; Negative: possible decreased foot sensation, age   PT Frequency 2x / week   PT Duration 8 weeks   PT Treatment/Interventions ADLs/Self Care Home Management;Aquatic Therapy;Biofeedback;DME Instruction;Gait training;Stair training;Functional mobility training;Therapeutic activities;Therapeutic exercise;Balance training;Neuromuscular re-education;Patient/family education;Passive range of motion;Energy conservation   PT Next Visit Plan Progress balance and strengthening exercises,    PT Home Exercise Plan modified tandem balance, sit to stand from elevated chair without UE support, standing yellow tband hip abduction, hooklying bridges   Consulted and Agree with Plan of Care Patient      Patient will benefit from skilled therapeutic intervention in order to improve the following deficits and impairments:  Abnormal gait, Pain, Decreased balance  Visit Diagnosis: Unsteadiness on feet  Muscle weakness (generalized)  History of falling       G-Codes - Dec 16, 2016 1036    Functional Assessment Tool Used (Outpatient Only) BERG   Functional Limitation Mobility: Walking and moving around   Mobility: Walking and Moving Around Current Status ([id number]) At least 20 percent but less than 40 percent impaired, limited or restricted   Mobility: Walking and Moving Around Goal Status [phone]) At least 20 percent but less than 40 percent impaired, limited or restricted      Problem List Patient Active Problem List   Diagnosis Date Noted  . Diarrhea 05/18/2016  . Fall 05/01/2016  . Unintentional weight  loss 12/30/2015  . Leg weakness, bilateral 12/30/2015  . Hyperkalemia 01/01/2015  . Occasional numbness/prickling/tingling of fingers and toes 12/18/2014  . Routine general medical examination at a health care facility 12/13/2013  . Macrocytic anemia 05/19/2013  . 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 Psoriasis   . Osteoarthritis, multiple sites   . Episodic mood disorder ([place]) 12/01/2011  . History of breast cancer 10/28/2011  . GERD (gastroesophageal reflux disease)   . Hyperlipidemia   . Hypertension   . Osteopenia   . Occlusion and stenosis of carotid artery without mention of cerebral infarction 05/27/2011   Royce Macadamia PT, DPT, CSCS    2016-12-16, 10:45 AM  [facility] [address] [place], [address] Phone: [phone]   Fax:  [phone]  Name: [name] MRN: [id number] Date of Birth: Mar 17, 1941

## 2016-11-30 ENCOUNTER — Encounter: Payer: Self-pay | Admitting: Physical Therapy

## 2016-12-01 ENCOUNTER — Ambulatory Visit: Payer: Medicare Other

## 2016-12-02 ENCOUNTER — Encounter: Payer: Self-pay | Admitting: Physical Therapy

## 2016-12-03 ENCOUNTER — Ambulatory Visit: Payer: Medicare Other

## 2016-12-03 DIAGNOSIS — Z9181 History of falling: Secondary | ICD-10-CM

## 2016-12-03 DIAGNOSIS — R2681 Unsteadiness on feet: Secondary | ICD-10-CM

## 2016-12-03 DIAGNOSIS — M6281 Muscle weakness (generalized): Secondary | ICD-10-CM | POA: Diagnosis not present

## 2016-12-03 NOTE — Therapy (Signed)
[facility] [address] [place], [address] Phone: [phone]   Fax:  [phone]  Physical Therapy Treatment  Patient Details  Name: [name] MRN: [id number] Date of Birth: 10/22/1940 Referring Provider: Dr. Caryl Bis  Encounter Date: 12/03/2016      PT End of Session - 12/03/16 1316    Visit Number 10   Number of Visits 17   Date for PT Re-Evaluation December 22, 2016   Authorization Type g codes 1/10   PT Start Time 10/24/05   PT Stop Time 1345   PT Time Calculation (min) 38 min   Equipment Utilized During Treatment Gait belt   Activity Tolerance Patient tolerated treatment well   Behavior During Therapy [facility] for tasks assessed/performed      Past Medical History:  Diagnosis Date  . Breast cancer ([place]) 25-Oct-1999   left breast  . Cancer ([place]) 1999-10-25   left breast ca  . GERD (gastroesophageal reflux disease)   . Hyperlipidemia   . Hypertension   . Osteoarthritis, multiple sites   . Osteoarthrosis involving, or with mention of more than one site, but not specified as generalized, multiple sites   . Osteopenia   . Personal history of radiation therapy 10-25-1999   left breast ca  . Psoriasis     Past Surgical History:  Procedure Laterality Date  . ABDOMINAL HYSTERECTOMY    . ANKLE FRACTURE SURGERY  4/08   left---hardware still in place  . BREAST BIOPSY Left October 25, 1999   breast ca  . BREAST EXCISIONAL BIOPSY Left yrs ago   benign  . BREAST LUMPECTOMY  October 25, 1999   left  . CAROTID ENDARTERECTOMY Left 10/23/2004  . FRACTURE SURGERY    . SHOULDER SURGERY  6/07   left  . TONSILLECTOMY AND ADENOIDECTOMY    . TOTAL HIP ARTHROPLASTY  25-Oct-2002   right    There were no vitals filed for this visit.      Subjective Assessment - 12/03/16 1310    Subjective Patient reports no falls since the previous visit. Patient reports she feels physically weak today from not getting enough sleep.    Pertinent History Pt reports that  she has noticed worsening of her balance recently. She has had approximately 4 falls in the last 6 months. No known pattern to her falls. Pt reports that she gets in a rush and then she falls. Denies dizziness. Denies motion sensitivity. She has hardware in her L ankle from a fracture of both her tibia and fibula secondary to a fall. She notices worse balance in the AM. 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 No/denies      Therapeutic Exercise: Side stepping on airex beam - x10 down/back with intermittent UE support Nustep with level 4 with cueing on speed of performance and use of UEs- 30min Step ups onto 8" step with B LE - 2 x 10 with B Les Airex pad step taps onto 8" - 2 x 20  Hip machine - hip abduction - 2 x 15 with cueing on hip and UE positioning 40#  Heel raises in standing with UE support - 2 x 20 Mini Squats with UE support and cueing to perform greater hip flexion - 2 x 20  Stepping marches on balance stones - 2 x 15 with UE support  Reaching forward  with single leg stance on airex pad - 2 x8 round (~3 directions)  -Patient demonstrate increased fatigue in the LEs at end of therapy       PT Education - 12/03/16 1315    Education provided Yes   Education Details form/technique with exercise   Person(s) Educated Patient   Methods Explanation;Demonstration   Comprehension Verbalized understanding;Returned demonstration             PT Long Term Goals - 11/26/16 1044      PT LONG TERM GOAL #1   Title Pt will be independent with HEP in order to improve strength and balance in order to decrease fall risk and improve function at home and work.    Time 8   Period Weeks   Status Achieved     PT LONG TERM GOAL #2   Title Pt will improve BERG by at least 3 points in order to demonstrate clinically significant  improvement in balance.     Baseline 10/19/16: 44/56, 11/26/16 -- 47/56   Time 8   Period Weeks   Status Achieved     PT LONG TERM GOAL #3   Title Pt will be able to perform sit to stand from regular height chair x 10 without UE support to improve functional independence when raising up from low surfaces such as commode   Baseline 10/19/16: unable to perform sit to stand without UE support -- 5/10 still unable to complete 1 without use of UEs.    Time 8   Period Weeks   Status On-going     PT LONG TERM GOAL #4   Title Pt will improve ABC by at least 13% in order to demonstrate clinically significant improvement in balance confidence.    Baseline 10/19/16: 46.88%   Time 8   Period Weeks   Status On-going               Plan - 12/03/16 1317    Clinical Impression Statement Continue to address balance and strength definiencies as patient demonstrates increased postural sway and fatigue with exercise performance. Patient demonstrates increased postural sway most noteably with narrow base of support and patient will benefit from further skilled therapy focused on improving current limitations to decrease fall risk and return to prior level of function.   Rehab Potential Good   Clinical Impairments Affecting Rehab Potential Positive: motivation; Negative: possible decreased foot sensation, age   PT Frequency 2x / week   PT Duration 8 weeks   PT Treatment/Interventions ADLs/Self Care Home Management;Aquatic Therapy;Biofeedback;DME Instruction;Gait training;Stair training;Functional mobility training;Therapeutic activities;Therapeutic exercise;Balance training;Neuromuscular re-education;Patient/family education;Passive range of motion;Energy conservation   PT Next Visit Plan Progress balance and strengthening exercises,    PT Home Exercise Plan modified tandem balance, sit to stand from elevated chair without UE support, standing yellow tband hip abduction, hooklying bridges   Consulted and  Agree with Plan of Care Patient      Patient will benefit from skilled therapeutic intervention in order to improve the following deficits and impairments:  Abnormal gait, Pain, Decreased balance  Visit Diagnosis: Unsteadiness on feet  Muscle weakness (generalized)  History of falling     Problem List Patient Active Problem List   Diagnosis Date Noted  . Diarrhea 05/18/2016  . Fall 05/01/2016  . Unintentional weight loss 12/30/2015  . Leg weakness, bilateral 12/30/2015  . Hyperkalemia 01/01/2015  . Occasional numbness/prickling/tingling of fingers and toes 12/18/2014  . Routine general medical examination at a health care  facility 12/13/2013  . Macrocytic anemia 05/19/2013  . 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 Psoriasis   . Osteoarthritis, multiple sites   . Episodic mood disorder ([place]) 12/01/2011  . History of breast cancer 10/28/2011  . GERD (gastroesophageal reflux disease)   . Hyperlipidemia   . Hypertension   . Osteopenia   . Occlusion and stenosis of carotid artery without mention of cerebral infarction 05/27/2011    Blythe Stanford, PT DPT 12/03/2016, 1:42 PM  [facility] [address] [place], [address] Phone: [phone]   Fax:  [phone]  Name: [name] MRN: [id number] Date of Birth: [birth year]

## 2016-12-08 ENCOUNTER — Encounter: Payer: Self-pay | Admitting: Physical Therapy

## 2016-12-10 ENCOUNTER — Encounter: Payer: Self-pay | Admitting: Physical Therapy

## 2016-12-11 ENCOUNTER — Ambulatory Visit: Payer: Medicare Other

## 2016-12-15 ENCOUNTER — Encounter: Payer: Self-pay | Admitting: Physical Therapy

## 2016-12-16 ENCOUNTER — Ambulatory Visit: Payer: Medicare Other

## 2016-12-16 DIAGNOSIS — Z9181 History of falling: Secondary | ICD-10-CM

## 2016-12-16 DIAGNOSIS — R2681 Unsteadiness on feet: Secondary | ICD-10-CM | POA: Diagnosis not present

## 2016-12-16 DIAGNOSIS — M6281 Muscle weakness (generalized): Secondary | ICD-10-CM | POA: Diagnosis not present

## 2016-12-16 NOTE — Therapy (Signed)
[facility] [address] [place], [address] Phone: [phone]   Fax:  [phone]  Physical Therapy Treatment  Patient Details  Name: BREA COLESON MRN: [id number] Date of Birth: [birth year] Referring Provider: Dr. Caryl Bis  Encounter Date: 12/16/2016      PT End of Session - 12/16/16 1122    Visit Number 11   Number of Visits 17   Date for PT Re-Evaluation 01/30/17   Authorization Type g codes 2022-10-14   PT Start Time 1045   PT Stop Time 1130   PT Time Calculation (min) 45 min   Equipment Utilized During Treatment Gait belt   Activity Tolerance Patient tolerated treatment well   Behavior During Therapy [facility] for tasks assessed/performed      Past Medical History:  Diagnosis Date  . Breast cancer ([place]) 2001   left breast  . Cancer ([place]) 2001   left breast ca  . GERD (gastroesophageal reflux disease)   . Hyperlipidemia   . Hypertension   . Osteoarthritis, multiple sites   . Osteoarthrosis involving, or with mention of more than one site, but not specified as generalized, multiple sites   . Osteopenia   . Personal history of radiation therapy 2001   left breast ca  . Psoriasis     Past Surgical History:  Procedure Laterality Date  . ABDOMINAL HYSTERECTOMY    . ANKLE FRACTURE SURGERY  4/08   left---hardware still in place  . 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 BREAST EXCISIONAL BIOPSY Left yrs ago   benign  . BREAST LUMPECTOMY  2001   left  . CAROTID ENDARTERECTOMY Left 10/23/2004  . FRACTURE SURGERY    . SHOULDER SURGERY  6/07   left  . TONSILLECTOMY AND ADENOIDECTOMY    . TOTAL HIP ARTHROPLASTY  2004   right    There were no vitals filed for this visit.      Subjective Assessment - 12/16/16 1105    Subjective Patient reports no fall since the previous visit. Patient reports she got a charlie horse last Thursday which resulted in a muscle pull of her R hip flexor. Patient  reports increased pain with perform hip flexion on the R.    Pertinent History Pt reports that she has noticed worsening of her balance recently. She has had approximately 4 falls in the last 6 months. No known pattern to her falls. Pt reports that she gets in a rush and then she falls. Denies dizziness. Denies motion sensitivity. She has hardware in her L ankle from a fracture of both her tibia and fibula secondary to a fall. She notices worse balance in the AM. 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 Yes   Pain Score 4    Pain Location Hip   Pain Orientation Right   Pain Descriptors / Indicators Sharp   Pain Type Acute pain   Pain Onset In the past 7 days   Pain Frequency Intermittent      TREATMENT: Recert forward forward g Codes Nustep - 6 min Level 1 to improve hip mobility and cueing on speed Bridges - x 20 in hooklying with cueing to activate glutes Psoas stetch in standing - 2 x 30sec holds Hip adduction in hooklying with cueing on glute activation against ball -  x 20 Hip abduction in hooklying against RTB - x 20 Isometric hip flexion knee bent - x2 sec hold x 10  Isometric hip flexion knee straight - x 2 sec hold x 10 Marches in Hooklying - 2 x 10  Manual therapy: STM rectus femoris, iliopsoas with patient in hooklying to decrease pain and spasms in the affected area       PT Education - 12/16/16 1108    Education provided Yes   Education Details Educated on muscle strains and POC   Person(s) Educated Patient   Methods Explanation;Demonstration   Comprehension Returned demonstration;Verbalized understanding             PT Long Term Goals - 12/16/16 1219      PT LONG TERM GOAL #1   Title Pt will be independent with HEP in order to improve strength and balance in order to decrease fall risk and improve  function at home and work.    Time 8   Period Weeks   Status Achieved     PT LONG TERM GOAL #2   Title Pt will improve BERG by at least 3 points in order to demonstrate clinically significant improvement in balance.     Baseline 10/19/16: 44/56, 11/26/16 -- 47/56   Time 8   Period Weeks   Status Achieved     PT LONG TERM GOAL #3   Title Pt will be able to perform sit to stand from regular height chair x 10 without UE support to improve functional independence when raising up from low surfaces such as commode   Baseline 10/19/16: unable to perform sit to stand without UE support -- 5/10 still unable to complete 1 without use of UEs.    Time 8   Period Weeks   Status On-going     PT LONG TERM GOAL #4   Title Pt will improve ABC by at least 13% in order to demonstrate clinically significant improvement in balance confidence.    Baseline 10/19/16: 46.88%   Time 8   Period Weeks   Status On-going               Plan - 12/16/16 1216    Clinical Impression Statement Patient demonstrates increased pain in her R hip flexor and required exercises aimed at improving pain to perform standing balance exercises. Patient demonstrated decreased pain after performing hip flexion isometrics, hip flexion AAROM, and Nustep activitty. Patient reports decreased pain at end of session indicating decreased guarding and patient will benefit from further skilled therapy focused on improving R hip flexor pain to allow for the performance of balance/strength exercises to decrease fall risk.    Rehab Potential Good   Clinical Impairments Affecting Rehab Potential Positive: motivation; Negative: possible decreased foot sensation, age   PT Frequency 2x / week   PT Duration 8 weeks   PT Treatment/Interventions ADLs/Self Care Home Management;Aquatic Therapy;Biofeedback;DME Instruction;Gait training;Stair training;Functional mobility training;Therapeutic activities;Therapeutic exercise;Balance training;Neuromuscular  re-education;Patient/family education;Passive range of motion;Energy conservation   PT Next Visit Plan Progress balance and strengthening exercises,    PT Home Exercise Plan modified tandem balance, sit to stand from elevated chair without UE support, standing yellow tband hip abduction, hooklying bridges   Consulted and Agree with Plan of Care Patient      Patient will benefit from skilled therapeutic intervention in order to improve the following deficits and impairments:  Abnormal gait, Pain, Decreased balance  Visit Diagnosis: Unsteadiness on feet  Muscle weakness (generalized)  History of falling  Problem List Patient Active Problem List   Diagnosis Date Noted  . Diarrhea 05/18/2016  . Fall 05/01/2016  . Unintentional weight loss 12/30/2015  . Leg weakness, bilateral 12/30/2015  . Hyperkalemia 01/01/2015  . Occasional numbness/prickling/tingling of fingers and toes 12/18/2014  . Routine general medical examination at a health care facility 12/13/2013  . Macrocytic anemia 05/19/2013  . 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 Psoriasis   . Osteoarthritis, multiple sites   . Episodic mood disorder ([place]) 12/01/2011  . History of breast cancer 10/28/2011  . GERD (gastroesophageal reflux disease)   . Hyperlipidemia   . Hypertension   . Osteopenia   . Occlusion and stenosis of carotid artery without mention of cerebral infarction 05/27/2011    Blythe Stanford, PT DPT 12/16/2016, 12:21 PM  [facility] [address], [place], [address] Phone: [phone]   Fax:  [phone]  Name: GIAH FICKETT MRN: [id number] Date of Birth: 15-Oct-1940

## 2016-12-17 ENCOUNTER — Encounter: Payer: Self-pay | Admitting: Physical Therapy

## 2016-12-21 ENCOUNTER — Ambulatory Visit (INDEPENDENT_AMBULATORY_CARE_PROVIDER_SITE_OTHER): Payer: Medicare Other | Admitting: Family Medicine

## 2016-12-21 ENCOUNTER — Encounter: Payer: Self-pay | Admitting: Family Medicine

## 2016-12-21 VITALS — BP 158/102 | HR 64 | Temp 98.6°F | Wt 116.0 lb

## 2016-12-21 DIAGNOSIS — R634 Abnormal weight loss: Secondary | ICD-10-CM | POA: Diagnosis not present

## 2016-12-21 DIAGNOSIS — I10 Essential (primary) hypertension: Secondary | ICD-10-CM | POA: Diagnosis not present

## 2016-12-21 DIAGNOSIS — F39 Unspecified mood [affective] disorder: Secondary | ICD-10-CM

## 2016-12-21 LAB — COMPREHENSIVE METABOLIC PANEL
ALT: 10 U/L (ref 0–35)
AST: 18 U/L (ref 0–37)
Albumin: 4.4 g/dL (ref 3.5–5.2)
Alkaline Phosphatase: 58 U/L (ref 39–117)
BUN: 19 mg/dL (ref 6–23)
CO2: 30 mEq/L (ref 19–32)
Calcium: 9.8 mg/dL (ref 8.4–10.5)
Chloride: 98 mEq/L (ref 96–112)
Creatinine, Ser: 0.71 mg/dL (ref 0.40–1.20)
GFR: 84.67 mL/min (ref >60.00)
Glucose, Bld: 90 mg/dL (ref 70–99)
Potassium: 4.8 mEq/L (ref 3.5–5.1)
Sodium: 135 mEq/L (ref 135–145)
Total Bilirubin: 0.5 mg/dL (ref 0.2–1.2)
Total Protein: 6.8 g/dL (ref 6.0–8.3)

## 2016-12-21 NOTE — Assessment & Plan Note (Addendum)
Uncontrolled today. Has not been checking at home. Was previously well controlled. Discussed starting on an additional medication given elevation today though she was hesitant to do this. She will return in 1 week for a blood pressure check with our nurse. If remains elevated we will start on new medication. We'll check a CMP today.

## 2016-12-21 NOTE — Assessment & Plan Note (Signed)
Continues to have no improvement with the increased dose of Celexa. Still feels depressed and anxious. Suspect this is leading to her prior weight loss which has stabilized. We will taper her off of the Celexa. Once she is off of this we will trial her on Remeron. She'll follow-up 8 weeks after starting on the Remeron.

## 2016-12-21 NOTE — Assessment & Plan Note (Signed)
This has stabilized. This is most likely related to her anxiety and depression and we are working on treating this. I did rediscuss obtaining a chest x-ray given her smoking history though she opted against this at this time. Other workup has been unremarkable. She'll continue to monitor her weight at home and if it trends down she will let us know.

## 2016-12-21 NOTE — Patient Instructions (Addendum)
Nice to see you. Please continue your blood pressure medication. Please start checking your blood pressure at home. We will taper you off of Celexa. You should take Celexa 20 mg by mouth daily for 2 weeks, then decrease to 10 mg by mouth daily for 1 week and then decrease to 10 mg by mouth every other day for 1 week and discontinue. When you get to the last week please contact us and we will send in a new medication for you.

## 2016-12-21 NOTE — Progress Notes (Signed)
[name], MD Phone: [phone]  [name] is a 76 y.o. female who presents today for follow-up.  Hypertension: Not checking at home. Is taking metoprolol. No chest pain or shortness of breath or edema.  Depression: Notes no difference in this with the increased dose of Celexa. No SI or HI. Still remains depressed and anxious. Just has no energy. Appetite is not there. Finds it difficult to cook large amounts of food given that her husband doesn't eat very much with his Alzheimer's. Does feel like she has support from her brother and daughters. Also has an Alzheimer's support group at her living facility. Weight has been stable now for some time. Prior workup has been unremarkable. Cancer screening up-to-date. Does not eat a whole lot given lack of appetite. She reports no early satiety. No night sweats. No abdominal pain. She is a former smoker. We discussed obtaining a chest x-ray during her last visit.  PMH: Former smoker   ROS see history of present illness  Objective  Physical Exam Vitals:   12/21/16 1359  BP: (!) 158/102  Pulse: 64  Temp: 98.6 F (37 C)    BP Readings from Last 3 Encounters:  12/21/16 (!) 158/102  11/03/16 112/62  09/21/16 136/90   Wt Readings from Last 3 Encounters:  12/21/16 116 lb (52.6 kg)  11/03/16 114 lb (51.7 kg)  09/21/16 116 lb 12.8 oz (53 kg)    Physical Exam  Constitutional: No distress.  Cardiovascular: Normal rate, regular rhythm and normal heart sounds.   Pulmonary/Chest: Effort normal and breath sounds normal.  Abdominal: Soft. Bowel sounds are normal. She exhibits no distension. There is no tenderness. There is no rebound and no guarding.  Musculoskeletal: She exhibits no edema.  Lymphadenopathy:       Head (right side): No submental and no submandibular adenopathy present.       Head (left side): No submental and no submandibular adenopathy present.    She has no cervical adenopathy.       Right: No  supraclavicular adenopathy present.       Left: No supraclavicular adenopathy present.  Neurological: She is alert. Gait normal.  Skin: Skin is warm and dry. She is not diaphoretic.     Assessment/Plan: Please see individual problem list.  Episodic mood disorder Continues to have no improvement with the increased dose of Celexa. Still feels depressed and anxious. Suspect this is leading to her prior weight loss which has stabilized. We will taper her off of the Celexa. Once she is off of this we will trial her on Remeron. She'll follow-up 8 weeks after starting on the Remeron.  Hypertension Uncontrolled today. Has not been checking at home. Was previously well controlled. Discussed starting on an additional medication given elevation today though she was hesitant to do this. She will return in 1 week for a blood pressure check with our nurse. If remains elevated we will start on new medication. We'll check a CMP today.  Unintentional weight loss This has stabilized. This is most likely related to her anxiety and depression and we are working on treating this. I did rediscuss obtaining a chest x-ray given her smoking history though she opted against this at this time. Other workup has been unremarkable. She'll continue to monitor her weight at home and if it trends down she will let us know.   Orders Placed This Encounter  Procedures  . Comp Met (CMET)   [name], MD [place]

## 2016-12-22 ENCOUNTER — Encounter: Payer: Self-pay | Admitting: Physical Therapy

## 2016-12-22 ENCOUNTER — Ambulatory Visit: Payer: Medicare Other | Attending: Family Medicine

## 2016-12-22 DIAGNOSIS — R2681 Unsteadiness on feet: Secondary | ICD-10-CM | POA: Insufficient documentation

## 2016-12-22 DIAGNOSIS — Z9181 History of falling: Secondary | ICD-10-CM | POA: Insufficient documentation

## 2016-12-22 DIAGNOSIS — M6281 Muscle weakness (generalized): Secondary | ICD-10-CM | POA: Diagnosis not present

## 2016-12-22 NOTE — Therapy (Signed)
[facility] [address] [place], [address] Phone: [phone]   Fax:  [phone]  Physical Therapy Treatment  Patient Details  Name: [name] MRN: [id number] Date of Birth: [birth year] Referring Provider: Dr. Caryl Bis  Encounter Date: 12/22/2016      PT End of Session - 12/22/16 1156    Visit Number 12   Number of Visits 17   Date for PT Re-Evaluation Jan 26, 2017   Authorization Type g codes 2022-11-10   PT Start Time Oct 29, 1117   PT Stop Time 10-30-1155   PT Time Calculation (min) 38 min   Equipment Utilized During Treatment Gait belt   Activity Tolerance Patient tolerated treatment well   Behavior During Therapy [facility] for tasks assessed/performed      Past Medical History:  Diagnosis Date  . Breast cancer ([place]) 10/30/1999   left breast  . Cancer ([place]) October 30, 1999   left breast ca  . GERD (gastroesophageal reflux disease)   . Hyperlipidemia   . Hypertension   . Osteoarthritis, multiple sites   . Osteoarthrosis involving, or with mention of more than one site, but not specified as generalized, multiple sites   . Osteopenia   . Personal history of radiation therapy 10/30/1999   left breast ca  . Psoriasis     Past Surgical History:  Procedure Laterality Date  . ABDOMINAL HYSTERECTOMY    . ANKLE FRACTURE SURGERY  4/08   left---hardware still in place  . BREAST BIOPSY Left 10-30-1999   breast ca  . BREAST EXCISIONAL BIOPSY Left yrs ago   benign  . BREAST LUMPECTOMY  October 30, 1999   left  . CAROTID ENDARTERECTOMY Left 10/23/2004  . FRACTURE SURGERY    . SHOULDER SURGERY  6/07   left  . TONSILLECTOMY AND ADENOIDECTOMY    . TOTAL HIP ARTHROPLASTY  10/30/02   right    There were no vitals filed for this visit.      Subjective Assessment - 12/22/16 1149    Subjective Patient reports the previous treatment helped a lot to decrease her pain. Patient reports her groin pain she had before the previous visit has decreased considerably  and does not currently have any pain in her groin.    Pertinent History Pt reports that she has noticed worsening of her balance recently. She has had approximately 4 falls in the last 6 months. No known pattern to her falls. Pt reports that she gets in a rush and then she falls. Denies dizziness. Denies motion sensitivity. She has hardware in her L ankle from a fracture of both her tibia and fibula secondary to a fall. She notices worse balance in the AM. 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 No/denies   Pain Onset In the past 7 days        TREATMENT:   Nustep - 6 min Level 3 to improve hip mobility and cueing on speed Side stepping along airex beam - x 8 with intermittent UE support  Step up/ side stepping with airex beam - x 3 down and back  Stand ups from chair with airex pad - x 15 Hip abduction with light use of UEs - 2x 15 B Feet together Pertubations for balance - 2 x 91min  Reaching out laterally forward and backward for hip stability - x  3 directions x7 bouts  Step ups onto 6" step - 2 x 10 B LE        PT Education - 12/22/16 1154    Education provided Yes   Education Details form/technique with exercise   Person(s) Educated Patient   Methods Explanation;Demonstration   Comprehension Verbalized understanding;Returned demonstration             PT Long Term Goals - 12/16/16 1219      PT LONG TERM GOAL #1   Title Pt will be independent with HEP in order to improve strength and balance in order to decrease fall risk and improve function at home and work.    Time 8   Period Weeks   Status Achieved     PT LONG TERM GOAL #2   Title Pt will improve BERG by at least 3 points in order to demonstrate clinically significant improvement in balance.     Baseline 10/19/16: 44/56, 11/26/16 -- 47/56   Time 8   Period  Weeks   Status Achieved     PT LONG TERM GOAL #3   Title Pt will be able to perform sit to stand from regular height chair x 10 without UE support to improve functional independence when raising up from low surfaces such as commode   Baseline 10/19/16: unable to perform sit to stand without UE support -- 5/10 still unable to complete 1 without use of UEs.    Time 8   Period Weeks   Status On-going     PT LONG TERM GOAL #4   Title Pt will improve ABC by at least 13% in order to demonstrate clinically significant improvement in balance confidence.    Baseline 10/19/16: 46.88%   Time 8   Period Weeks   Status On-going               Plan - 12/22/16 1238    Clinical Impression Statement Patient demonstrates improvement in hip flexor strain and is able to tolerate increased resistance exercise without increase in pain. Patient demonstrates fatigue and weakness in B LEs and hips and patient will benefit from further skilled therapy focused on increasing hip strength to return to prior level of function.    Rehab Potential Good   Clinical Impairments Affecting Rehab Potential Positive: motivation; Negative: possible decreased foot sensation, age   PT Frequency 2x / week   PT Duration 8 weeks   PT Treatment/Interventions ADLs/Self Care Home Management;Aquatic Therapy;Biofeedback;DME Instruction;Gait training;Stair training;Functional mobility training;Therapeutic activities;Therapeutic exercise;Balance training;Neuromuscular re-education;Patient/family education;Passive range of motion;Energy conservation   PT Next Visit Plan Progress balance and strengthening exercises,    PT Home Exercise Plan modified tandem balance, sit to stand from elevated chair without UE support, standing yellow tband hip abduction, hooklying bridges   Consulted and Agree with Plan of Care Patient      Patient will benefit from skilled therapeutic intervention in order to improve the following deficits and  impairments:  Abnormal gait, Pain, Decreased balance  Visit Diagnosis: Unsteadiness on feet  Muscle weakness (generalized)  History of falling     Problem List Patient Active Problem List   Diagnosis Date Noted  . Diarrhea 05/18/2016  . Fall 05/01/2016  . Unintentional weight loss 12/30/2015  . Leg weakness, bilateral 12/30/2015  . Hyperkalemia 01/01/2015  . Occasional numbness/prickling/tingling of fingers and toes 12/18/2014  . Routine general medical examination at a health care facility 12/13/2013  . Macrocytic anemia 05/19/2013  . 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  Psoriasis   . Osteoarthritis, multiple sites   . Episodic mood disorder ([place]) 12/01/2011  . History of breast cancer 10/28/2011  . GERD (gastroesophageal reflux disease)   . Hyperlipidemia   . Hypertension   . Osteopenia   . Occlusion and stenosis of carotid artery without mention of cerebral infarction 05/27/2011    Blythe Stanford, PT DPT 12/22/2016, 12:45 PM  [facility] [address] [place], [address] Phone: [phone]   Fax:  [phone]  Name: [name] MRN: [id number] Date of Birth: 09-Apr-1941

## 2016-12-24 ENCOUNTER — Ambulatory Visit: Payer: Medicare Other

## 2016-12-24 ENCOUNTER — Encounter: Payer: Self-pay | Admitting: Physical Therapy

## 2016-12-24 DIAGNOSIS — M6281 Muscle weakness (generalized): Secondary | ICD-10-CM | POA: Diagnosis not present

## 2016-12-24 DIAGNOSIS — R2681 Unsteadiness on feet: Secondary | ICD-10-CM | POA: Diagnosis not present

## 2016-12-24 DIAGNOSIS — Z9181 History of falling: Secondary | ICD-10-CM

## 2016-12-24 NOTE — Therapy (Signed)
[place] [facility] Nov 09, [address], [place], [address] Phone: [phone]   Fax:  [phone]  Physical Therapy Treatment  Patient Details  Name: [name] MRN: [id number] Date of Birth: 08/07/1940 Referring Provider: Dr. Caryl Bis  Encounter Date: 12/24/2016      PT End of Session - 12/24/16 1320    Visit Number 13   Number of Visits 17   Date for PT Re-Evaluation February 06, 2017   Authorization Type g codes 5/10   PT Start Time 11-10-1310   PT Stop Time 1345   PT Time Calculation (min) 33 min   Equipment Utilized During Treatment Gait belt   Activity Tolerance Patient tolerated treatment well   Behavior During Therapy [facility] for tasks assessed/performed      Past Medical History:  Diagnosis Date  . Breast cancer ([place]) 1999/11/10   left breast  . Cancer ([place]) 11/10/99   left breast ca  . GERD (gastroesophageal reflux disease)   . Hyperlipidemia   . Hypertension   . Osteoarthritis, multiple sites   . Osteoarthrosis involving, or with mention of more than one site, but not specified as generalized, multiple sites   . Osteopenia   . Personal history of radiation therapy 11/10/1999   left breast ca  . Psoriasis     Past Surgical History:  Procedure Laterality Date  . ABDOMINAL HYSTERECTOMY    . ANKLE FRACTURE SURGERY  4/08   left---hardware still in place  . BREAST BIOPSY Left 11/10/99   breast ca  . BREAST EXCISIONAL BIOPSY Left yrs ago   benign  . BREAST LUMPECTOMY  November 10, 1999   left  . CAROTID ENDARTERECTOMY Left 10/23/2004  . FRACTURE SURGERY    . SHOULDER SURGERY  6/07   left  . TONSILLECTOMY AND ADENOIDECTOMY    . TOTAL HIP ARTHROPLASTY  2002-11-10   right    There were no vitals filed for this visit.      Subjective Assessment - 12/24/16 1317    Subjective Patient reports no major changes since the previous visit. Patient states she has not fell since the previous visit.    Pertinent History Pt reports that she has  noticed worsening of her balance recently. She has had approximately 4 falls in the last 6 months. No known pattern to her falls. Pt reports that she gets in a rush and then she falls. Denies dizziness. Denies motion sensitivity. She has hardware in her L ankle from a fracture of both her tibia and fibula secondary to a fall. She notices worse balance in the AM. 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 No/denies   Pain Onset In the past 7 days        TREATMENT:    Nustep - 5 min Level 5 to improve hip mobility and cueing on speed Hip Machine:  hip abduction - 2 x 20 40#  Leg Press OMEGA - 2 x 10 65# Side stepping along airex beam with 4" step under beam - x 8 with intermittent UE support  Stand ups from chair with RTB around knees - x 15 Stepping hip abduction/Diagonal/Forward/backward stepping with RTB around knees - x 20 Patient reports increased fatigue at end of session.        PT Education - 12/24/16 1319    Education provided Yes  Education Details form/technique with exercise   Person(s) Educated Patient   Methods Explanation;Demonstration   Comprehension Verbalized understanding;Returned demonstration             PT Long Term Goals - 12/16/16 1219      PT LONG TERM GOAL #1   Title Pt will be independent with HEP in order to improve strength and balance in order to decrease fall risk and improve function at home and work.    Time 8   Period Weeks   Status Achieved     PT LONG TERM GOAL #2   Title Pt will improve BERG by at least 3 points in order to demonstrate clinically significant improvement in balance.     Baseline 10/19/16: 44/56, 11/26/16 -- 47/56   Time 8   Period Weeks   Status Achieved     PT LONG TERM GOAL #3   Title Pt will be able to perform sit to stand from regular height chair x 10 without UE  support to improve functional independence when raising up from low surfaces such as commode   Baseline 10/19/16: unable to perform sit to stand without UE support -- 5/10 still unable to complete 1 without use of UEs.    Time 8   Period Weeks   Status On-going     PT LONG TERM GOAL #4   Title Pt will improve ABC by at least 13% in order to demonstrate clinically significant improvement in balance confidence.    Baseline 10/19/16: 46.88%   Time 8   Period Weeks   Status On-going               Plan - 12/24/16 1320    Clinical Impression Statement Patient arrives 12 min late for appointment and performed shorted session today secondary to tardiness. Continued to focus on improving quadricep strength and hip stabilization to improve sit to stand ability and walking with greater stabilization. Patient demonstrates increased quad weakness and will benefit from further skilled therapy focused on improving quad/hip strength to return to prior level of function.   Rehab Potential Good   Clinical Impairments Affecting Rehab Potential Positive: motivation; Negative: possible decreased foot sensation, age   PT Frequency 2x / week   PT Duration 8 weeks   PT Treatment/Interventions ADLs/Self Care Home Management;Aquatic Therapy;Biofeedback;DME Instruction;Gait training;Stair training;Functional mobility training;Therapeutic activities;Therapeutic exercise;Balance training;Neuromuscular re-education;Patient/family education;Passive range of motion;Energy conservation   PT Next Visit Plan Progress balance and strengthening exercises,    PT Home Exercise Plan modified tandem balance, sit to stand from elevated chair without UE support, standing yellow tband hip abduction, hooklying bridges   Consulted and Agree with Plan of Care Patient      Patient will benefit from skilled therapeutic intervention in order to improve the following deficits and impairments:  Abnormal gait, Pain, Decreased  balance  Visit Diagnosis: Unsteadiness on feet  Muscle weakness (generalized)  History of falling     Problem List Patient Active Problem List   Diagnosis Date Noted  . Diarrhea 05/18/2016  . Fall 05/01/2016  . Unintentional weight loss 12/30/2015  . Leg weakness, bilateral 12/30/2015  . Hyperkalemia 01/01/2015  . Occasional numbness/prickling/tingling of fingers and toes 12/18/2014  . Routine general medical examination at a health care facility 12/13/2013  . Macrocytic anemia 05/19/2013  . Psoriatic arthritis ([place]) 02/03/2013  . Psoriasis   . Osteoarthritis, multiple sites   . Episodic mood disorder ([place]) 12/01/2011  . History of breast cancer 10/28/2011  . GERD (gastroesophageal  reflux disease)   . Hyperlipidemia   . Hypertension   . Osteopenia   . Occlusion and stenosis of carotid artery without mention of cerebral infarction 05/27/2011    Blythe Stanford, PT DPT 12/24/2016, 1:57 PM  [place] [facility] [address] [place], [address] Phone: [phone]   Fax:  [phone]  Name: [name] MRN: [id number] Date of Birth: 11/05/1940

## 2016-12-28 ENCOUNTER — Ambulatory Visit: Payer: Medicare Other

## 2016-12-28 DIAGNOSIS — Z9181 History of falling: Secondary | ICD-10-CM

## 2016-12-28 DIAGNOSIS — M6281 Muscle weakness (generalized): Secondary | ICD-10-CM | POA: Diagnosis not present

## 2016-12-28 DIAGNOSIS — R2681 Unsteadiness on feet: Secondary | ICD-10-CM | POA: Diagnosis not present

## 2016-12-28 NOTE — Therapy (Signed)
[place] [facility] [address], [place], [address] Phone: [phone]   Fax:  [phone]  Physical Therapy Treatment  Patient Details  Name: [name] MRN: [id number] Date of Birth: 08-Oct-1940 Referring Provider: Dr. Caryl Bis  Encounter Date: 12/28/2016      PT End of Session - 12/28/16 0956    Visit Number 14   Number of Visits 17   Date for PT Re-Evaluation 2017-01-16   Authorization Type g codes 6/10   PT Start Time 0950   PT Stop Time 1030   PT Time Calculation (min) 40 min   Equipment Utilized During Treatment Gait belt   Activity Tolerance Patient tolerated treatment well   Behavior During Therapy [facility] for tasks assessed/performed      Past Medical History:  Diagnosis Date  . Breast cancer ([place]) 2001   left breast  . Cancer ([place]) 2001   left breast ca  . GERD (gastroesophageal reflux disease)   . Hyperlipidemia   . Hypertension   . Osteoarthritis, multiple sites   . Osteoarthrosis involving, or with mention of more than one site, but not specified as generalized, multiple sites   . Osteopenia   . Personal history of radiation therapy 2001   left breast ca  . Psoriasis     Past Surgical History:  Procedure Laterality Date  . ABDOMINAL HYSTERECTOMY    . ANKLE FRACTURE SURGERY  4/08   left---hardware still in place  . 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 BREAST EXCISIONAL BIOPSY Left yrs ago   benign  . BREAST LUMPECTOMY  2001   left  . CAROTID ENDARTERECTOMY Left 10/23/2004  . FRACTURE SURGERY    . SHOULDER SURGERY  6/07   left  . TONSILLECTOMY AND ADENOIDECTOMY    . TOTAL HIP ARTHROPLASTY  2004   right    There were no vitals filed for this visit.      Subjective Assessment - 12/28/16 0954    Subjective Patient repots no major changes since the previous visit. Patient reports she has not had a return of the groin pain.   Pertinent History Pt reports that she has  noticed worsening of her balance recently. She has had approximately 4 falls in the last 6 months. No known pattern to her falls. Pt reports that she gets in a rush and then she falls. Denies dizziness. Denies motion sensitivity. She has hardware in her L ankle from a fracture of both her tibia and fibula secondary to a fall. She notices worse balance in the AM. 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 No/denies   Pain Onset In the past 7 days      TREATMENT:    Nustep - 5 min Level 5 to improve hip mobility and cueing on speed Side stepping across airex beam - x 10 down and back Hip Machine:  hip abduction - 2 x 20 40#  Obstacle course stepping over canes and half foam rollers and airex pad s - x5 down and back ~74ft in length Side stepping over 6 " step - 2 x 20 up and over  Leg Press OMEGA - 2 x 10 65# with minimal therapist assist Tandem stance heel/toe raises with intermittent UE support. -- x 20  Patient reports increased fatigue at  end of session.        PT Education - 12/28/16 0955    Education provided Yes   Education Details Form/technique with exercise   Person(s) Educated Patient   Methods Explanation;Demonstration   Comprehension Verbalized understanding;Returned demonstration             PT Long Term Goals - 12/16/16 1219      PT LONG TERM GOAL #1   Title Pt will be independent with HEP in order to improve strength and balance in order to decrease fall risk and improve function at home and work.    Time 8   Period Weeks   Status Achieved     PT LONG TERM GOAL #2   Title Pt will improve BERG by at least 3 points in order to demonstrate clinically significant improvement in balance.     Baseline 10/19/16: 44/56, 11/26/16 -- 47/56   Time 8   Period Weeks   Status Achieved     PT LONG TERM GOAL #3   Title  Pt will be able to perform sit to stand from regular height chair x 10 without UE support to improve functional independence when raising up from low surfaces such as commode   Baseline 10/19/16: unable to perform sit to stand without UE support -- 5/10 still unable to complete 1 without use of UEs.    Time 8   Period Weeks   Status On-going     PT LONG TERM GOAL #4   Title Pt will improve ABC by at least 13% in order to demonstrate clinically significant improvement in balance confidence.    Baseline 10/19/16: 46.88%   Time 8   Period Weeks   Status On-going               Plan - 12/28/16 1023    Clinical Impression Statement Patient demonstrates improvement in LE strength and balance today with less postural sway with balancing activities indicating functional carryover between session. Although patient is improving, she continues to demonstrate decreased LE strength and will benefit from further skilled therapy to return to prior level of function.   Rehab Potential Good   Clinical Impairments Affecting Rehab Potential Positive: motivation; Negative: possible decreased foot sensation, age   PT Frequency 2x / week   PT Duration 8 weeks   PT Treatment/Interventions ADLs/Self Care Home Management;Aquatic Therapy;Biofeedback;DME Instruction;Gait training;Stair training;Functional mobility training;Therapeutic activities;Therapeutic exercise;Balance training;Neuromuscular re-education;Patient/family education;Passive range of motion;Energy conservation   PT Next Visit Plan Progress balance and strengthening exercises,    PT Home Exercise Plan modified tandem balance, sit to stand from elevated chair without UE support, standing yellow tband hip abduction, hooklying bridges   Consulted and Agree with Plan of Care Patient      Patient will benefit from skilled therapeutic intervention in order to improve the following deficits and impairments:  Abnormal gait, Pain, Decreased  balance  Visit Diagnosis: Unsteadiness on feet  Muscle weakness (generalized)  History of falling     Problem List Patient Active Problem List   Diagnosis Date Noted  . Diarrhea 05/18/2016  . Fall 05/01/2016  . Unintentional weight loss 12/30/2015  . Leg weakness, bilateral 12/30/2015  . Hyperkalemia 01/01/2015  . Occasional numbness/prickling/tingling of fingers and toes 12/18/2014  . Routine general medical examination at a health care facility 12/13/2013  . Macrocytic anemia 05/19/2013  . 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 Psoriasis   . Osteoarthritis, multiple sites   . Episodic mood disorder ([place]) 12/01/2011  .  History of breast cancer 10/28/2011  . GERD (gastroesophageal reflux disease)   . Hyperlipidemia   . Hypertension   . Osteopenia   . Occlusion and stenosis of carotid artery without mention of cerebral infarction 05/27/2011    Blythe Stanford, PT DPT 12/28/2016, 10:32 AM  [place] [facility] [address] [place], [address] Phone: [phone]   Fax:  [phone]  Name: [name] MRN: [id number] Date of Birth: 1940/09/01

## 2016-12-29 ENCOUNTER — Ambulatory Visit (INDEPENDENT_AMBULATORY_CARE_PROVIDER_SITE_OTHER): Payer: Medicare Other | Admitting: *Deleted

## 2016-12-29 VITALS — BP 140/88 | HR 59 | Resp 18

## 2016-12-29 DIAGNOSIS — I10 Essential (primary) hypertension: Secondary | ICD-10-CM

## 2016-12-29 DIAGNOSIS — Z111 Encounter for screening for respiratory tuberculosis: Secondary | ICD-10-CM

## 2016-12-29 NOTE — Progress Notes (Signed)
At goal for age. Continue current medications.  [name], M.D.

## 2016-12-29 NOTE — Progress Notes (Signed)
Patient present for 1 week Follow up on BP in office, today BP left arm 138/88 pulse 57, right arm BP attained 140/88 pulse 57 , patient had taken Metoprolol 50 mg at 7 Am .

## 2016-12-29 NOTE — Progress Notes (Signed)
Patient notified and voiced understanding.

## 2016-12-31 ENCOUNTER — Ambulatory Visit: Payer: Medicare Other

## 2016-12-31 DIAGNOSIS — M6281 Muscle weakness (generalized): Secondary | ICD-10-CM

## 2016-12-31 DIAGNOSIS — Z9181 History of falling: Secondary | ICD-10-CM

## 2016-12-31 DIAGNOSIS — R2681 Unsteadiness on feet: Secondary | ICD-10-CM

## 2016-12-31 NOTE — Therapy (Signed)
[place] [facility] [address] [place], [address] Phone: [phone]   Fax:  [phone]  Physical Therapy Treatment  Patient Details  Name: [name] MRN: [id number] Date of Birth: [birth year] Referring Provider: Dr. Caryl Bis  Encounter Date: 12/31/2016      PT End of Session - 12/31/16 1109    Visit Number 15   Number of Visits 17   Date for PT Re-Evaluation 2017-02-06   Authorization Type g codes 7/10   PT Start Time 1040   PT Stop Time 1115   PT Time Calculation (min) 35 min   Equipment Utilized During Treatment Gait belt   Activity Tolerance Patient tolerated treatment well   Behavior During Therapy [facility] for tasks assessed/performed      Past Medical History:  Diagnosis Date  . Breast cancer ([place]) 2001   left breast  . Cancer ([place]) 2001   left breast ca  . GERD (gastroesophageal reflux disease)   . Hyperlipidemia   . Hypertension   . Osteoarthritis, multiple sites   . Osteoarthrosis involving, or with mention of more than one site, but not specified as generalized, multiple sites   . Osteopenia   . Personal history of radiation therapy 2001   left breast ca  . Psoriasis     Past Surgical History:  Procedure Laterality Date  . ABDOMINAL HYSTERECTOMY    . ANKLE FRACTURE SURGERY  4/08   left---hardware still in place  . 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 BREAST EXCISIONAL BIOPSY Left yrs ago   benign  . BREAST LUMPECTOMY  2001   left  . CAROTID ENDARTERECTOMY Left 10/23/2004  . FRACTURE SURGERY    . SHOULDER SURGERY  6/07   left  . TONSILLECTOMY AND ADENOIDECTOMY    . TOTAL HIP ARTHROPLASTY  2004   right    There were no vitals filed for this visit.      Subjective Assessment - 12/31/16 1048    Subjective Patient reports no major changes since the previous treatment session; and reports increased fatigue at end of session.   Pertinent History Pt reports that she has  noticed worsening of her balance recently. She has had approximately 4 falls in the last 6 months. No known pattern to her falls. Pt reports that she gets in a rush and then she falls. Denies dizziness. Denies motion sensitivity. She has hardware in her L ankle from a fracture of both her tibia and fibula secondary to a fall. She notices worse balance in the AM. 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 No/denies   Pain Onset In the past 7 days        TREATMENT:    Nustep - 5 min Level 6 to improve hip mobility and cueing on speed Hip Machine:  hip abduction - x 25 40#, x25 25# Mini Squats with UE support with focus on hip flexion and glute activation - 2 x 25 Wobble board in standing lateral weight shifts --  x29min Leg Press OMEGA - 2 x 10 75# with minimal therapist assist Side stepping across airex beam - x 10 down and back  Patient reports increased fatigue within her quads at end of session       PT Education - 12/31/16 1051  Education provided Yes   Education Details Form/technique with exercise   Person(s) Educated Patient   Methods Explanation;Demonstration   Comprehension Verbalized understanding;Returned demonstration             PT Long Term Goals - 12/16/16 1219      PT LONG TERM GOAL #1   Title Pt will be independent with HEP in order to improve strength and balance in order to decrease fall risk and improve function at home and work.    Time 8   Period Weeks   Status Achieved     PT LONG TERM GOAL #2   Title Pt will improve BERG by at least 3 points in order to demonstrate clinically significant improvement in balance.     Baseline 10/19/16: 44/56, 11/26/16 -- 47/56   Time 8   Period Weeks   Status Achieved     PT LONG TERM GOAL #3   Title Pt will be able to perform sit to stand from regular height  chair x 10 without UE support to improve functional independence when raising up from low surfaces such as commode   Baseline 10/19/16: unable to perform sit to stand without UE support -- 5/10 still unable to complete 1 without use of UEs.    Time 8   Period Weeks   Status On-going     PT LONG TERM GOAL #4   Title Pt will improve ABC by at least 13% in order to demonstrate clinically significant improvement in balance confidence.    Baseline 10/19/16: 46.88%   Time 8   Period Weeks   Status On-going               Plan - 12/31/16 1112    Clinical Impression Statement Patient arrives late for appointment today therefore performed shorted session. Patient demonstrates ability to perform more weight with leg press today indicating improvement in strength and carryover between visits. Patient will benefit from further skilled therpay focused on improving LE strength to return to prior level of function.    Rehab Potential Good   Clinical Impairments Affecting Rehab Potential Positive: motivation; Negative: possible decreased foot sensation, age   PT Frequency 2x / week   PT Duration 8 weeks   PT Treatment/Interventions ADLs/Self Care Home Management;Aquatic Therapy;Biofeedback;DME Instruction;Gait training;Stair training;Functional mobility training;Therapeutic activities;Therapeutic exercise;Balance training;Neuromuscular re-education;Patient/family education;Passive range of motion;Energy conservation   PT Next Visit Plan Progress balance and strengthening exercises,    PT Home Exercise Plan modified tandem balance, sit to stand from elevated chair without UE support, standing yellow tband hip abduction, hooklying bridges   Consulted and Agree with Plan of Care Patient      Patient will benefit from skilled therapeutic intervention in order to improve the following deficits and impairments:  Abnormal gait, Pain, Decreased balance  Visit Diagnosis: Unsteadiness on feet  Muscle  weakness (generalized)  History of falling     Problem List Patient Active Problem List   Diagnosis Date Noted  . Diarrhea 05/18/2016  . Fall 05/01/2016  . Unintentional weight loss 12/30/2015  . Leg weakness, bilateral 12/30/2015  . Hyperkalemia 01/01/2015  . Occasional numbness/prickling/tingling of fingers and toes 12/18/2014  . Routine general medical examination at a health care facility 12/13/2013  . Macrocytic anemia 05/19/2013  . 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 Psoriasis   . Osteoarthritis, multiple sites   . Episodic mood disorder ([place]) 12/01/2011  . History of breast cancer 10/28/2011  . GERD (gastroesophageal reflux disease)   . Hyperlipidemia   .  Hypertension   . Osteopenia   . Occlusion and stenosis of carotid artery without mention of cerebral infarction 05/27/2011    Blythe Stanford, PT DPT 12/31/2016, 11:15 AM  [place] [facility] [address], [place], [address] Phone: [phone]   Fax:  [phone]  Name: CHERREE CONERLY MRN: [id number] Date of Birth: 12/13/1940

## 2017-01-04 ENCOUNTER — Ambulatory Visit: Payer: Medicare Other

## 2017-01-04 DIAGNOSIS — M6281 Muscle weakness (generalized): Secondary | ICD-10-CM | POA: Diagnosis not present

## 2017-01-04 DIAGNOSIS — Z9181 History of falling: Secondary | ICD-10-CM | POA: Diagnosis not present

## 2017-01-04 DIAGNOSIS — R2681 Unsteadiness on feet: Secondary | ICD-10-CM | POA: Diagnosis not present

## 2017-01-04 NOTE — Therapy (Signed)
[facility] [address], [place], [address] Phone: [phone]   Fax:  [phone]  Physical Therapy Treatment  Patient Details  Name: [name] MRN: [id number] Date of Birth: 06/14/1941 Referring Provider: Dr. Caryl Bis  Encounter Date: 01/04/2017      PT End of Session - 01/04/17 1441    Visit Number 16   Number of Visits 17   Date for PT Re-Evaluation 01/30/2017   Authorization Type g codes 8/10   PT Start Time 1430   PT Stop Time 1500   PT Time Calculation (min) 30 min   Equipment Utilized During Treatment Gait belt   Activity Tolerance Patient tolerated treatment well   Behavior During Therapy [facility] for tasks assessed/performed      Past Medical History:  Diagnosis Date  . Breast cancer ([place]) 2001   left breast  . Cancer ([place]) 2001   left breast ca  . GERD (gastroesophageal reflux disease)   . Hyperlipidemia   . Hypertension   . Osteoarthritis, multiple sites   . Osteoarthrosis involving, or with mention of more than one site, but not specified as generalized, multiple sites   . Osteopenia   . Personal history of radiation therapy 2001   left breast ca  . Psoriasis     Past Surgical History:  Procedure Laterality Date  . ABDOMINAL HYSTERECTOMY    . ANKLE FRACTURE SURGERY  4/08   left---hardware still in place  . 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 BREAST EXCISIONAL BIOPSY Left yrs ago   benign  . BREAST LUMPECTOMY  2001   left  . CAROTID ENDARTERECTOMY Left 10/23/2004  . FRACTURE SURGERY    . SHOULDER SURGERY  6/07   left  . TONSILLECTOMY AND ADENOIDECTOMY    . TOTAL HIP ARTHROPLASTY  2004   right    There were no vitals filed for this visit.      Subjective Assessment - 01/04/17 1436    Subjective Patient reports no major changes since the previous visit but reports she feels she is getting stronger.    Pertinent History Pt reports that she has noticed  worsening of her balance recently. She has had approximately 4 falls in the last 6 months. No known pattern to her falls. Pt reports that she gets in a rush and then she falls. Denies dizziness. Denies motion sensitivity. She has hardware in her L ankle from a fracture of both her tibia and fibula secondary to a fall. She notices worse balance in the AM. 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 No/denies   Pain Onset In the past 7 days       TREATMENT:    Nustep - 5 min Level 5 to improve hip mobility and cueing on speed Side stepping across airex beam - x 10 down and back Sit to stands - 2 x 10  Tandem ambulation over airex beam - x 10 down and back  Leg Press OMEGA - 2 x 10 75# with minimal therapist assist Hip Machine:  hip abduction - 2 x 10 40# B   Patient reports increased fatigue within her quads/hips at end of session       PT Education - 01/04/17 1440    Education provided Yes   Education Details  Form/technique with exercise    Person(s) Educated Patient   Methods Explanation;Demonstration   Comprehension Verbalized understanding;Returned demonstration             PT Long Term Goals - 12/16/16 1219      PT LONG TERM GOAL #1   Title Pt will be independent with HEP in order to improve strength and balance in order to decrease fall risk and improve function at home and work.    Time 8   Period Weeks   Status Achieved     PT LONG TERM GOAL #2   Title Pt will improve BERG by at least 3 points in order to demonstrate clinically significant improvement in balance.     Baseline 10/19/16: 44/56, 11/26/16 -- 47/56   Time 8   Period Weeks   Status Achieved     PT LONG TERM GOAL #3   Title Pt will be able to perform sit to stand from regular height chair x 10 without UE support to improve functional independence when  raising up from low surfaces such as commode   Baseline 10/19/16: unable to perform sit to stand without UE support -- 5/10 still unable to complete 1 without use of UEs.    Time 8   Period Weeks   Status On-going     PT LONG TERM GOAL #4   Title Pt will improve ABC by at least 13% in order to demonstrate clinically significant improvement in balance confidence.    Baseline 10/19/16: 46.88%   Time 8   Period Weeks   Status On-going               Plan - 01/04/17 1445    Clinical Impression Statement Patient demonstrates improvement in LE strength and function with ability to perform sit to stand without using her UEs indicating funcitonal carryover between sessions. Patient demonstrates decreased strength and balance with increased postural sway and patient will benefit from further skilled therapy to retutn to prior level of function.     Rehab Potential Good   Clinical Impairments Affecting Rehab Potential Positive: motivation; Negative: possible decreased foot sensation, age   PT Frequency 2x / week   PT Duration 8 weeks   PT Treatment/Interventions ADLs/Self Care Home Management;Aquatic Therapy;Biofeedback;DME Instruction;Gait training;Stair training;Functional mobility training;Therapeutic activities;Therapeutic exercise;Balance training;Neuromuscular re-education;Patient/family education;Passive range of motion;Energy conservation   PT Next Visit Plan Progress balance and strengthening exercises,    PT Home Exercise Plan modified tandem balance, sit to stand from elevated chair without UE support, standing yellow tband hip abduction, hooklying bridges   Consulted and Agree with Plan of Care Patient      Patient will benefit from skilled therapeutic intervention in order to improve the following deficits and impairments:  Abnormal gait, Pain, Decreased balance  Visit Diagnosis: Unsteadiness on feet  Muscle weakness (generalized)  History of falling     Problem  List Patient Active Problem List   Diagnosis Date Noted  . Diarrhea 05/18/2016  . Fall 05/01/2016  . Unintentional weight loss 12/30/2015  . Leg weakness, bilateral 12/30/2015  . Hyperkalemia 01/01/2015  . Occasional numbness/prickling/tingling of fingers and toes 12/18/2014  . Routine general medical examination at a health care facility 12/13/2013  . Macrocytic anemia 05/19/2013  . 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 Psoriasis   . Osteoarthritis, multiple sites   . Episodic mood disorder ([place]) 12/01/2011  . History of breast cancer 10/28/2011  . GERD (gastroesophageal reflux disease)   . Hyperlipidemia   . Hypertension   .  Osteopenia   . Occlusion and stenosis of carotid artery without mention of cerebral infarction 05/27/2011    Blythe Stanford, PT DPT 01/04/2017, 2:59 PM  [facility] [address] [place], [address] Phone: [phone]   Fax:  [phone]  Name: [name] MRN: [id number] Date of Birth: Mar 31, 1941

## 2017-01-07 ENCOUNTER — Ambulatory Visit: Payer: Medicare Other

## 2017-01-07 DIAGNOSIS — M6281 Muscle weakness (generalized): Secondary | ICD-10-CM | POA: Diagnosis not present

## 2017-01-07 DIAGNOSIS — Z9181 History of falling: Secondary | ICD-10-CM | POA: Diagnosis not present

## 2017-01-07 DIAGNOSIS — R2681 Unsteadiness on feet: Secondary | ICD-10-CM | POA: Diagnosis not present

## 2017-01-07 NOTE — Therapy (Signed)
[facility] [address], [place], [address] Phone: [phone]   Fax:  [phone]  Physical Therapy Treatment  Patient Details  Name: [name] MRN: [id number] Date of Birth: Mar 22, 1941 Referring Provider: Dr. Caryl Bis  Encounter Date: 01/07/2017      PT End of Session - 01/07/17 1440    Visit Number 17   Number of Visits 25   Date for PT Re-Evaluation January 22, 2017   Authorization Type g codes 2023-04-08   PT Start Time 1430   PT Stop Time 1515   PT Time Calculation (min) 45 min   Equipment Utilized During Treatment Gait belt   Activity Tolerance Patient tolerated treatment well   Behavior During Therapy [facility] for tasks assessed/performed      Past Medical History:  Diagnosis Date  . Breast cancer ([place]) 2001   left breast  . Cancer ([place]) 2001   left breast ca  . GERD (gastroesophageal reflux disease)   . Hyperlipidemia   . Hypertension   . Osteoarthritis, multiple sites   . Osteoarthrosis involving, or with mention of more than one site, but not specified as generalized, multiple sites   . Osteopenia   . Personal history of radiation therapy 2001   left breast ca  . Psoriasis     Past Surgical History:  Procedure Laterality Date  . ABDOMINAL HYSTERECTOMY    . ANKLE FRACTURE SURGERY  4/08   left---hardware still in place  . 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 BREAST EXCISIONAL BIOPSY Left yrs ago   benign  . BREAST LUMPECTOMY  2001   left  . CAROTID ENDARTERECTOMY Left 10/23/2004  . FRACTURE SURGERY    . SHOULDER SURGERY  6/07   left  . TONSILLECTOMY AND ADENOIDECTOMY    . TOTAL HIP ARTHROPLASTY  2004   right    There were no vitals filed for this visit.      Subjective Assessment - 01/07/17 1437    Subjective Patient reports no major changes since the previous visit and states she feels she continues to be getting stronger and having improved balance   Pertinent History Pt  reports that she has noticed worsening of her balance recently. She has had approximately 4 falls in the last 6 months. No known pattern to her falls. Pt reports that she gets in a rush and then she falls. Denies dizziness. Denies motion sensitivity. She has hardware in her L ankle from a fracture of both her tibia and fibula secondary to a fall. She notices worse balance in the AM. 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 No/denies   Pain Onset In the past 7 days      TREATMENT:    Nustep - 5 min Level 6 to improve hip mobility and cueing on speed Hip Machine:  hip abduction - 2 x 20 40# B Leg Press OMEGA - 2 x 10 75# with minimal therapist assist Side stepping across 6 balance stones - x 7 down and back Sit to stands with blue airex pad - x10 ,x 15 Walking over balance stones  - x 10 down and back  [employer] with 7 # dbs in each hand - 6 x 12 ft  Heel/toe raises on airex pad  -- x 20  Patient reports increased fatigue within her quads/hips at end of session      PT Education - 01/07/17 1439    Education provided Yes   Education Details Reinforced HEP   Person(s) Educated Patient   Methods Explanation;Demonstration   Comprehension Verbalized understanding;Returned demonstration             PT Long Term Goals - 12/16/16 1219      PT LONG TERM GOAL #1   Title Pt will be independent with HEP in order to improve strength and balance in order to decrease fall risk and improve function at home and work.    Time 8   Period Weeks   Status Achieved     PT LONG TERM GOAL #2   Title Pt will improve BERG by at least 3 points in order to demonstrate clinically significant improvement in balance.     Baseline 10/19/16: 44/56, 11/26/16 -- 47/56   Time 8   Period Weeks   Status Achieved     PT LONG TERM GOAL #3   Title Pt  will be able to perform sit to stand from regular height chair x 10 without UE support to improve functional independence when raising up from low surfaces such as commode   Baseline 10/19/16: unable to perform sit to stand without UE support -- 5/10 still unable to complete 1 without use of UEs.    Time 8   Period Weeks   Status On-going     PT LONG TERM GOAL #4   Title Pt will improve ABC by at least 13% in order to demonstrate clinically significant improvement in balance confidence.    Baseline 10/19/16: 46.88%   Time 8   Period Weeks   Status On-going               Plan - 01/07/17 1447    Clinical Impression Statement Patient demonstrates increased postural sway with standing balance exercises indicating decreased dynamic balance. Patient demonstrates improvement in glute med activation as indicated by being able to perform greater amount of exercises before fatigue and patient will benefit from further skilled therapy to return to prior level of function.    Rehab Potential Good   Clinical Impairments Affecting Rehab Potential Positive: motivation; Negative: possible decreased foot sensation, age   PT Frequency 2x / week   PT Duration 8 weeks   PT Treatment/Interventions ADLs/Self Care Home Management;Aquatic Therapy;Biofeedback;DME Instruction;Gait training;Stair training;Functional mobility training;Therapeutic activities;Therapeutic exercise;Balance training;Neuromuscular re-education;Patient/family education;Passive range of motion;Energy conservation   PT Next Visit Plan Progress balance and strengthening exercises,    PT Home Exercise Plan modified tandem balance, sit to stand from elevated chair without UE support, standing yellow tband hip abduction, hooklying bridges   Consulted and Agree with Plan of Care Patient      Patient will benefit from skilled therapeutic intervention in order to improve the following deficits and impairments:  Abnormal gait, Pain, Decreased  balance  Visit Diagnosis: Unsteadiness on feet  Muscle weakness (generalized)  History of falling     Problem List Patient Active Problem List   Diagnosis Date Noted  . Diarrhea 05/18/2016  . Fall 05/01/2016  . Unintentional weight loss 12/30/2015  . Leg weakness, bilateral 12/30/2015  . Hyperkalemia 01/01/2015  . Occasional numbness/prickling/tingling of fingers and toes 12/18/2014  . Routine general medical examination at a health care facility 12/13/2013  . Macrocytic anemia 05/19/2013  . 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 Psoriasis   . Osteoarthritis, multiple sites   .  Episodic mood disorder ([place]) 12/01/2011  . History of breast cancer 10/28/2011  . GERD (gastroesophageal reflux disease)   . Hyperlipidemia   . Hypertension   . Osteopenia   . Occlusion and stenosis of carotid artery without mention of cerebral infarction 05/27/2011    Blythe Stanford, PT DPT 01/07/2017, 3:11 PM  [place] [address] [place], [address] Phone: [phone]   Fax:  [phone]  Name: GORDON CARLSON MRN: [id number] Date of Birth: 1941-02-23

## 2017-01-11 ENCOUNTER — Ambulatory Visit: Payer: Medicare Other

## 2017-01-11 DIAGNOSIS — Z9181 History of falling: Secondary | ICD-10-CM

## 2017-01-11 DIAGNOSIS — M6281 Muscle weakness (generalized): Secondary | ICD-10-CM

## 2017-01-11 DIAGNOSIS — R2681 Unsteadiness on feet: Secondary | ICD-10-CM | POA: Diagnosis not present

## 2017-01-11 NOTE — Therapy (Signed)
[facility] [address] [place], [address] Phone: [phone]   Fax:  [phone]  Physical Therapy Treatment  Patient Details  Name: [name] MRN: [id number] Date of Birth: 09-05-1940 Referring Provider: Dr. Caryl Bis  Encounter Date: 01/11/2017      PT End of Session - 01/11/17 1101    Visit Number 18   Number of Visits 25   Date for PT Re-Evaluation 02/06/2017   Authorization Type g codes 10/10   PT Start Time 1031   PT Stop Time 1115   PT Time Calculation (min) 44 min   Equipment Utilized During Treatment Gait belt   Activity Tolerance Patient tolerated treatment well   Behavior During Therapy [facility] for tasks assessed/performed      Past Medical History:  Diagnosis Date  . Breast cancer ([place]) 2001   left breast  . Cancer ([place]) 2001   left breast ca  . GERD (gastroesophageal reflux disease)   . Hyperlipidemia   . Hypertension   . Osteoarthritis, multiple sites   . Osteoarthrosis involving, or with mention of more than one site, but not specified as generalized, multiple sites   . Osteopenia   . Personal history of radiation therapy 2001   left breast ca  . Psoriasis     Past Surgical History:  Procedure Laterality Date  . ABDOMINAL HYSTERECTOMY    . ANKLE FRACTURE SURGERY  4/08   left---hardware still in place  . 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 BREAST EXCISIONAL BIOPSY Left yrs ago   benign  . BREAST LUMPECTOMY  2001   left  . CAROTID ENDARTERECTOMY Left 10/23/2004  . FRACTURE SURGERY    . SHOULDER SURGERY  6/07   left  . TONSILLECTOMY AND ADENOIDECTOMY    . TOTAL HIP ARTHROPLASTY  2004   right    There were no vitals filed for this visit.      Subjective Assessment - 01/11/17 1052    Subjective Patient reports no major changes since the previous visit. Patient reports increased LE fatigue at end of session.    Pertinent History Pt reports that she has  noticed worsening of her balance recently. She has had approximately 4 falls in the last 6 months. No known pattern to her falls. Pt reports that she gets in a rush and then she falls. Denies dizziness. Denies motion sensitivity. She has hardware in her L ankle from a fracture of both her tibia and fibula secondary to a fall. She notices worse balance in the AM. 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 No/denies   Pain Onset In the past 7 days         TREATMENT:    Nustep - 5 min Level 6 to improve hip mobility and cueing on speed Hip Machine:  hip abduction - 2 x 20 40# B Leg Press OMEGA - 2 x 10 75# with minimal therapist assist Side stepping across 4 balance stones - x 10 down and back Running man - 2x15 B with stance foot on airex pad Total Gym squats at 26 level - 2 x 25 Sit to stands with blue airex pad - x 15   Patient reports increased fatigue within her quads/hips at end of session  PT Education - 2017-01-25 1101    Education provided Yes   Education Details Form/technique with exercise   Person(s) Educated Patient   Methods Explanation;Demonstration   Comprehension Verbalized understanding;Returned demonstration             PT Long Term Goals - 12/16/16 1219      PT LONG TERM GOAL #1   Title Pt will be independent with HEP in order to improve strength and balance in order to decrease fall risk and improve function at home and work.    Time 8   Period Weeks   Status Achieved     PT LONG TERM GOAL #2   Title Pt will improve BERG by at least 3 points in order to demonstrate clinically significant improvement in balance.     Baseline 10/19/16: 44/56, 11/26/16 -- 47/56   Time 8   Period Weeks   Status Achieved     PT LONG TERM GOAL #3   Title Pt will be able to perform sit to stand from regular height  chair x 10 without UE support to improve functional independence when raising up from low surfaces such as commode   Baseline 10/19/16: unable to perform sit to stand without UE support -- 5/10 still unable to complete 1 without use of UEs.    Time 8   Period Weeks   Status On-going     PT LONG TERM GOAL #4   Title Pt will improve ABC by at least 13% in order to demonstrate clinically significant improvement in balance confidence.    Baseline 10/19/16: 46.88%   Time 8   Period Weeks   Status On-going               Plan - 01-25-2017 1107    Clinical Impression Statement Patient demonstrates increased fatigue with hip muscular exercises indicating decreased strength and endurance. Patient demonstrates improvement with LE exercises and improved strength; patient will benefit from further skilled therapy to return to prior level of function.    Rehab Potential Good   Clinical Impairments Affecting Rehab Potential Positive: motivation; Negative: possible decreased foot sensation, age   PT Frequency 2x / week   PT Duration 8 weeks   PT Treatment/Interventions ADLs/Self Care Home Management;Aquatic Therapy;Biofeedback;DME Instruction;Gait training;Stair training;Functional mobility training;Therapeutic activities;Therapeutic exercise;Balance training;Neuromuscular re-education;Patient/family education;Passive range of motion;Energy conservation   PT Next Visit Plan Progress balance and strengthening exercises,    PT Home Exercise Plan modified tandem balance, sit to stand from elevated chair without UE support, standing yellow tband hip abduction, hooklying bridges   Consulted and Agree with Plan of Care Patient      Patient will benefit from skilled therapeutic intervention in order to improve the following deficits and impairments:  Abnormal gait, Pain, Decreased balance  Visit Diagnosis: Unsteadiness on feet  Muscle weakness (generalized)  History of falling       G-Codes -  Jan 25, 2017 1114    Functional Assessment Tool Used (Outpatient Only) clinical judgement, Functional strength [id number]   Functional Limitation Mobility: Walking and moving around   Mobility: Walking and Moving Around Current Status ([id number]) At least 20 percent but less than 40 percent impaired, limited or restricted   Mobility: Walking and Moving Around Goal Status [id number]) At least 20 percent but less than 40 percent impaired, limited or restricted      Problem List Patient Active Problem List   Diagnosis Date Noted  . Diarrhea 05/18/2016  . Fall 05/01/2016  . Unintentional weight loss 12/30/2015  .  Leg weakness, bilateral 12/30/2015  . Hyperkalemia 01/01/2015  . Occasional numbness/prickling/tingling of fingers and toes 12/18/2014  . Routine general medical examination at a health care facility 12/13/2013  . Macrocytic anemia 05/19/2013  . 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 Psoriasis   . Osteoarthritis, multiple sites   . Episodic mood disorder ([place]) 12/01/2011  . History of breast cancer 10/28/2011  . GERD (gastroesophageal reflux disease)   . Hyperlipidemia   . Hypertension   . Osteopenia   . Occlusion and stenosis of carotid artery without mention of cerebral infarction 05/27/2011    Blythe Stanford, PT DPT 01/11/2017, 11:14 AM  [facility] [address], [place], [address] Phone: [phone]   Fax:  [phone]  Name: [name] MRN: [id number] Date of Birth: 1940/12/18

## 2017-01-14 ENCOUNTER — Ambulatory Visit: Payer: Medicare Other

## 2017-01-14 DIAGNOSIS — M6281 Muscle weakness (generalized): Secondary | ICD-10-CM

## 2017-01-14 DIAGNOSIS — Z9181 History of falling: Secondary | ICD-10-CM

## 2017-01-14 DIAGNOSIS — R2681 Unsteadiness on feet: Secondary | ICD-10-CM

## 2017-01-14 NOTE — Therapy (Signed)
[facility] [address] [place], [address] Phone: [phone]   Fax:  [phone]  Physical Therapy Treatment  Patient Details  Name: [name] MRN: [id number] Date of Birth: August 29, 1940 Referring Provider: Dr. Caryl Bis  Encounter Date: 01/14/2017      PT End of Session - 01/14/17 1135    Visit Number 19   Number of Visits 25   Date for PT Re-Evaluation March 13, 2017   Authorization Type g codes 1/10   PT Start Time 1120   PT Stop Time 1200   PT Time Calculation (min) 40 min   Equipment Utilized During Treatment Gait belt   Activity Tolerance Patient tolerated treatment well   Behavior During Therapy [facility] for tasks assessed/performed      Past Medical History:  Diagnosis Date  . Breast cancer ([place]) 2001   left breast  . Cancer ([place]) 2001   left breast ca  . GERD (gastroesophageal reflux disease)   . Hyperlipidemia   . Hypertension   . Osteoarthritis, multiple sites   . Osteoarthrosis involving, or with mention of more than one site, but not specified as generalized, multiple sites   . Osteopenia   . Personal history of radiation therapy 2001   left breast ca  . Psoriasis     Past Surgical History:  Procedure Laterality Date  . ABDOMINAL HYSTERECTOMY    . ANKLE FRACTURE SURGERY  4/08   left---hardware still in place  . 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 BREAST EXCISIONAL BIOPSY Left yrs ago   benign  . BREAST LUMPECTOMY  2001   left  . CAROTID ENDARTERECTOMY Left 10/23/2004  . FRACTURE SURGERY    . SHOULDER SURGERY  6/07   left  . TONSILLECTOMY AND ADENOIDECTOMY    . TOTAL HIP ARTHROPLASTY  2004   right    There were no vitals filed for this visit.      Subjective Assessment - 01/14/17 1134    Subjective Patient reports she's more easily able to walk across the grass/gravel when performing ADLs at home compared to the past.    Pertinent History Pt reports that she has  noticed worsening of her balance recently. She has had approximately 4 falls in the last 6 months. No known pattern to her falls. Pt reports that she gets in a rush and then she falls. Denies dizziness. Denies motion sensitivity. She has hardware in her L ankle from a fracture of both her tibia and fibula secondary to a fall. She notices worse balance in the AM. 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 No/denies   Pain Onset In the past 7 days            [facility] PT Assessment - 01/14/17 0001      Berg Balance Test   Sit to Stand Able to stand  independently using hands   Standing Unsupported Able to stand safely 2 minutes   Sitting with Back Unsupported but Feet Supported on Floor or Stool Able to sit safely and securely 2 minutes   Stand to Sit Sits safely with minimal use of hands   Transfers Able to transfer safely, minor use of hands   Standing Unsupported with Eyes Closed Able to stand 10 seconds safely   Standing Ubsupported with  Feet Together Able to place feet together independently and stand for 1 minute with supervision   From Standing, Reach Forward with Outstretched Arm Can reach confidently >25 cm (10")   From Standing Position, Pick up Object from [place] to pick up shoe safely and easily   From Standing Position, Turn to Look Behind Over each Shoulder Looks behind from both sides and weight shifts well   Turn 360 Degrees Able to turn 360 degrees safely in 4 seconds or less   Standing Unsupported, Alternately Place Feet on Step/Stool Able to complete 4 steps without aid or supervision   Standing Unsupported, One Foot in Front Able to plae foot ahead of the other independently and hold 30 seconds   Standing on One Leg Tries to lift leg/unable to hold 3 seconds but remains standing independently   Total Score 48        Observation: ABC: 65% Sit to stand to fatigue -- x17 with airex pad on chair  TREATMENT: Step taps onto 6" step -- x20 with intermittent UE support Single leg stance -- 2 x 30sec B Heel raises off of airex beam -- x30 with intermittent UE support Sit to stands -- x10, x17 with UE support Side stepping along airex beam without UE support -- x 20  Patient responds well to therapy with increased LE fatigue at end of session.          PT Education - 01/14/17 1134    Education provided Yes   Education Details form/technique with exercise   Person(s) Educated Patient   Methods Explanation;Demonstration   Comprehension Verbalized understanding;Returned demonstration             PT Long Term Goals - 01/14/17 1130      PT LONG TERM GOAL #1   Title Pt will be independent with HEP in order to improve strength and balance in order to decrease fall risk and improve function at home and work.    Time 8   Period Weeks   Status Achieved     PT LONG TERM GOAL #2   Title Pt will improve BERG by at least 3 points in order to demonstrate clinically significant improvement in balance.     Baseline 10/19/16: 44/56, 11/26/16 -- 47/56; 01/14/17: 49/56   Time 8   Period Weeks   Status Achieved     PT LONG TERM GOAL #3   Title Pt will be able to perform sit to stand from regular height chair x 10 without UE support to improve functional independence when raising up from low surfaces such as commode   Baseline 10/19/16: unable to perform sit to stand without UE support -- 5/10 still unable to complete 1 without use of UEs. 01/14/2017: able to perform x17 with airex on chair    Time 8   Period Weeks   Status On-going     PT LONG TERM GOAL #4   Title Pt will improve ABC by at least 13% in order to demonstrate clinically significant improvement in balance confidence.    Baseline 10/19/16: 46.88% 01/14/2017: 65%   Time 8   Period Weeks   Status Achieved               Plan -  01/14/17 1148    Clinical Impression Statement Patient demonstrates improvement with ABC scale, BERG, and sit to stands indicating functional improvement in balance and functional strength. Patient continues to demonstrate difficulty with performing sit to stands without UE support and  patient will benefit from further skilled therapy to return to prior level of funciton.    Rehab Potential Good   Clinical Impairments Affecting Rehab Potential Positive: motivation; Negative: possible decreased foot sensation, age   PT Frequency 2x / week   PT Duration 8 weeks   PT Treatment/Interventions ADLs/Self Care Home Management;Aquatic Therapy;Biofeedback;DME Instruction;Gait training;Stair training;Functional mobility training;Therapeutic activities;Therapeutic exercise;Balance training;Neuromuscular re-education;Patient/family education;Passive range of motion;Energy conservation   PT Next Visit Plan Progress balance and strengthening exercises,    PT Home Exercise Plan modified tandem balance, sit to stand from elevated chair without UE support, standing yellow tband hip abduction, hooklying bridges   Consulted and Agree with Plan of Care Patient      Patient will benefit from skilled therapeutic intervention in order to improve the following deficits and impairments:  Abnormal gait, Pain, Decreased balance  Visit Diagnosis: Unsteadiness on feet - Plan: PT plan of care cert/re-cert  Muscle weakness (generalized) - Plan: PT plan of care cert/re-cert  History of falling - Plan: PT plan of care cert/re-cert     Problem List Patient Active Problem List   Diagnosis Date Noted  . Diarrhea 05/18/2016  . Fall 05/01/2016  . Unintentional weight loss 12/30/2015  . Leg weakness, bilateral 12/30/2015  . Hyperkalemia 01/01/2015  . Occasional numbness/prickling/tingling of fingers and toes 12/18/2014  . Routine general medical examination at a health care facility 12/13/2013  . Macrocytic anemia  05/19/2013  . 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 Psoriasis   . Osteoarthritis, multiple sites   . Episodic mood disorder ([place]) 12/01/2011  . History of breast cancer 10/28/2011  . GERD (gastroesophageal reflux disease)   . Hyperlipidemia   . Hypertension   . Osteopenia   . Occlusion and stenosis of carotid artery without mention of cerebral infarction 05/27/2011    Blythe Stanford, PT DPT 01/14/2017, 12:01 PM  [facility] [address] [place], [address] Phone: [phone]   Fax:  [phone]  Name: [name] MRN: [id number] Date of Birth: 1940-12-22

## 2017-01-15 ENCOUNTER — Telehealth: Payer: Self-pay | Admitting: Family Medicine

## 2017-01-15 MED ORDER — MIRTAZAPINE 15 MG PO TABS: 15.0000 mg | ORAL_TABLET | Freq: Every day | ORAL | 3 refills | Status: DC

## 2017-01-15 NOTE — Telephone Encounter (Signed)
Remeron sent to pharmacy. She should start this 1 week after she takes her last dose of Celexa. She should follow-up in about 8 weeks. Thanks.

## 2017-01-15 NOTE — Telephone Encounter (Signed)
fyi

## 2017-01-15 NOTE — Telephone Encounter (Signed)
Pt returned office phone call. Please call her back. °

## 2017-01-15 NOTE — Telephone Encounter (Signed)
Pt was suppose to call and let Dr. Caryl Bis know when she completed her citalopram (CELEXA) 20 MG tablet. Her last dose will be on Monday, (half of a pill). Please call her at [phone].

## 2017-01-15 NOTE — Telephone Encounter (Signed)
Patient notified

## 2017-01-15 NOTE — Telephone Encounter (Signed)
Left message to return call 

## 2017-01-18 ENCOUNTER — Encounter: Payer: Self-pay | Admitting: Physical Therapy

## 2017-01-18 ENCOUNTER — Ambulatory Visit: Payer: Medicare Other | Attending: Family Medicine | Admitting: Physical Therapy

## 2017-01-18 DIAGNOSIS — Z9181 History of falling: Secondary | ICD-10-CM | POA: Insufficient documentation

## 2017-01-18 DIAGNOSIS — R2681 Unsteadiness on feet: Secondary | ICD-10-CM | POA: Diagnosis not present

## 2017-01-18 DIAGNOSIS — M6281 Muscle weakness (generalized): Secondary | ICD-10-CM

## 2017-01-18 NOTE — Therapy (Signed)
[facility] October 22, [address], [place], [address] Phone: [phone]   Fax:  [phone]  Physical Therapy Treatment  Patient Details  Name: [name] MRN: [id number] Date of Birth: 1940/11/03 Referring Provider: Dr. Caryl Bis  Encounter Date: 01/18/2017      PT End of Session - 01/18/17 1035    Visit Number 20   Number of Visits 25   Date for PT Re-Evaluation February 17, 2017   Authorization Type g codes 09/04/2022   PT Start Time 10-22-1032   PT Stop Time 1113   PT Time Calculation (min) 39 min   Equipment Utilized During Treatment Gait belt   Activity Tolerance Patient tolerated treatment well   Behavior During Therapy [facility] for tasks assessed/performed      Past Medical History:  Diagnosis Date  . Breast cancer ([place]) Oct 23, 1999   left breast  . Cancer ([place]) Oct 23, 1999   left breast ca  . GERD (gastroesophageal reflux disease)   . Hyperlipidemia   . Hypertension   . Osteoarthritis, multiple sites   . Osteoarthrosis involving, or with mention of more than one site, but not specified as generalized, multiple sites   . Osteopenia   . Personal history of radiation therapy 1999-10-23   left breast ca  . Psoriasis     Past Surgical History:  Procedure Laterality Date  . ABDOMINAL HYSTERECTOMY    . ANKLE FRACTURE SURGERY  4/08   left---hardware still in place  . BREAST BIOPSY Left 1999/10/23   breast ca  . BREAST EXCISIONAL BIOPSY Left yrs ago   benign  . BREAST LUMPECTOMY  Oct 23, 1999   left  . CAROTID ENDARTERECTOMY Left 10/23/2004  . FRACTURE SURGERY    . SHOULDER SURGERY  6/07   left  . TONSILLECTOMY AND ADENOIDECTOMY    . TOTAL HIP ARTHROPLASTY  10-23-02   right    There were no vitals filed for this visit.      Subjective Assessment - 01/18/17 1036    Subjective Pt reports she is sore from last session which has since resolved.  Pt denies any falls since starting therapy.     Pertinent History Pt reports that she has noticed  worsening of her balance recently. She has had approximately 4 falls in the last 6 months. No known pattern to her falls. Pt reports that she gets in a rush and then she falls. Denies dizziness. Denies motion sensitivity. She has hardware in her L ankle from a fracture of both her tibia and fibula secondary to a fall. She notices worse balance in the AM. 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 No/denies   Multiple Pain Sites No        TREATMENT:   Step taps onto 6" step -- x20 with intermittent UE support. Cues to increase R knee flexion to prevent R hip compensatory circumduction with improved stability   Single leg stance -- 2 x 30sec Bil. intermittent UE support required   Side stepping over small medicine ball with cues for knee flexion 2x20 each direction   Single leg heel raises off of airex beam - 2x20 each LE with intermittent UE support   Marching on airex pad with intermittent UE support 2x20 each LE   Sit to stands -- x20, x17 without UE support. Cues to power  up into standing.   Side stepping along airex beam without UE support -- x 10            PT Education - 01/18/17 1034    Education provided Yes   Education Details Exercise technique   Person(s) Educated Patient   Methods Explanation;Demonstration;Verbal cues   Comprehension Verbalized understanding;Returned demonstration;Verbal cues required;Need further instruction             PT Long Term Goals - 01/14/17 1130      PT LONG TERM GOAL #1   Title Pt will be independent with HEP in order to improve strength and balance in order to decrease fall risk and improve function at home and work.    Time 8   Period Weeks   Status Achieved     PT LONG TERM GOAL #2   Title Pt will improve BERG by at least 3 points in order to demonstrate clinically  significant improvement in balance.     Baseline 10/19/16: 44/56, 11/26/16 -- 47/56; 01/14/17: 49/56   Time 8   Period Weeks   Status Achieved     PT LONG TERM GOAL #3   Title Pt will be able to perform sit to stand from regular height chair x 10 without UE support to improve functional independence when raising up from low surfaces such as commode   Baseline 10/19/16: unable to perform sit to stand without UE support -- 5/10 still unable to complete 1 without use of UEs. 01/14/2017: able to perform x17 with airex on chair    Time 8   Period Weeks   Status On-going     PT LONG TERM GOAL #4   Title Pt will improve ABC by at least 13% in order to demonstrate clinically significant improvement in balance confidence.    Baseline 10/19/16: 46.88% 01/14/2017: 65%   Time 8   Period Weeks   Status Achieved               Plan - 01/18/17 1045    Clinical Impression Statement Pt demonstrates instability with balance exercises, espcially with SLS activities, requiring intermittent UE support to prevent LOB.  Cues provided for improved Bil knee flexion with toe tapping exercise as compensatory circumduction and hip hike noted, pt with improved stability following cues.  Pt requires several rest breaks due to fatigue and will benefit from continued skilled PT interventions for improved balance and decreased risk of falling.     Rehab Potential Good   Clinical Impairments Affecting Rehab Potential Positive: motivation; Negative: possible decreased foot sensation, age   PT Frequency 2x / week   PT Duration 8 weeks   PT Treatment/Interventions ADLs/Self Care Home Management;Aquatic Therapy;Biofeedback;DME Instruction;Gait training;Stair training;Functional mobility training;Therapeutic activities;Therapeutic exercise;Balance training;Neuromuscular re-education;Patient/family education;Passive range of motion;Energy conservation   PT Next Visit Plan Progress balance and strengthening exercises,    PT Home  Exercise Plan modified tandem balance, sit to stand from elevated chair without UE support, standing yellow tband hip abduction, hooklying bridges   Consulted and Agree with Plan of Care Patient      Patient will benefit from skilled therapeutic intervention in order to improve the following deficits and impairments:  Abnormal gait, Pain, Decreased balance  Visit Diagnosis: Unsteadiness on feet  Muscle weakness (generalized)  History of falling     Problem List Patient Active Problem List   Diagnosis Date Noted  . Diarrhea 05/18/2016  . Fall 05/01/2016  . Unintentional weight loss 12/30/2015  . Leg weakness,  bilateral 12/30/2015  . Hyperkalemia 01/01/2015  . Occasional numbness/prickling/tingling of fingers and toes 12/18/2014  . Routine general medical examination at a health care facility 12/13/2013  . Macrocytic anemia 05/19/2013  . 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 Psoriasis   . Osteoarthritis, multiple sites   . Episodic mood disorder ([place]) 12/01/2011  . History of breast cancer 10/28/2011  . GERD (gastroesophageal reflux disease)   . Hyperlipidemia   . Hypertension   . Osteopenia   . Occlusion and stenosis of carotid artery without mention of cerebral infarction 05/27/2011    Collie Siad PT, DPT 01/18/2017, 11:14 AM  [facility] [address] [place], [address] Phone: [phone]   Fax:  [phone]  Name: [name] MRN: [id number] Date of Birth: 12-02-1940

## 2017-01-25 ENCOUNTER — Ambulatory Visit: Payer: Medicare Other

## 2017-01-26 ENCOUNTER — Ambulatory Visit: Payer: Medicare Other

## 2017-01-28 ENCOUNTER — Ambulatory Visit: Payer: Medicare Other

## 2017-01-28 DIAGNOSIS — M6281 Muscle weakness (generalized): Secondary | ICD-10-CM

## 2017-01-28 DIAGNOSIS — Z9181 History of falling: Secondary | ICD-10-CM | POA: Diagnosis not present

## 2017-01-28 DIAGNOSIS — R2681 Unsteadiness on feet: Secondary | ICD-10-CM | POA: Diagnosis not present

## 2017-01-28 NOTE — Therapy (Signed)
[facility] [address], [place], [address] Phone: [phone]   Fax:  [phone]  Physical Therapy Treatment  Patient Details  Name: [name] MRN: [id number] Date of Birth: [birth year] Referring Provider: Dr. Caryl Bis  Encounter Date: 01/28/2017      PT End of Session - 01/28/17 1000    Visit Number 21   Number of Visits 25   Date for PT Re-Evaluation 03/08/17   Authorization Type g codes 22-Oct-2022   PT Start Time 0945   PT Stop Time 1030   PT Time Calculation (min) 45 min   Equipment Utilized During Treatment Gait belt   Activity Tolerance Patient tolerated treatment well   Behavior During Therapy [facility] for tasks assessed/performed      Past Medical History:  Diagnosis Date  . Breast cancer ([place]) 2001   left breast  . Cancer ([place]) 2001   left breast ca  . GERD (gastroesophageal reflux disease)   . Hyperlipidemia   . Hypertension   . Osteoarthritis, multiple sites   . Osteoarthrosis involving, or with mention of more than one site, but not specified as generalized, multiple sites   . Osteopenia   . Personal history of radiation therapy 2001   left breast ca  . Psoriasis     Past Surgical History:  Procedure Laterality Date  . ABDOMINAL HYSTERECTOMY    . ANKLE FRACTURE SURGERY  4/08   left---hardware still in place  . 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 BREAST EXCISIONAL BIOPSY Left yrs ago   benign  . BREAST LUMPECTOMY  2001   left  . CAROTID ENDARTERECTOMY Left 10/23/2004  . FRACTURE SURGERY    . SHOULDER SURGERY  6/07   left  . TONSILLECTOMY AND ADENOIDECTOMY    . TOTAL HIP ARTHROPLASTY  2004   right    There were no vitals filed for this visit.      Subjective Assessment - 01/28/17 0956    Subjective Patient reports increased soreness after the previous session and states improvement with standing ability.    Pertinent History Pt reports that she has noticed  worsening of her balance recently. She has had approximately 4 falls in the last 6 months. No known pattern to her falls. Pt reports that she gets in a rush and then she falls. Denies dizziness. Denies motion sensitivity. She has hardware in her L ankle from a fracture of both her tibia and fibula secondary to a fall. She notices worse balance in the AM. 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 No/denies      TREATMENT: Nustep - 65min, level 5 at seat level 9 with cueing on speed of performance Sit to stands -- x15, x15 with UE support Side stepping along airex beam without UE support -- x 20 Step taps from airex pad onto 6" step - x 10  Step ups onto 6" step - 2 x 15 B  Step ups onto bosu with marching at the top position - x15 Side stepping up and over on bosu ball - x10 Heel/toe raises on Bosu ball with intermittent UE support    Patient responds well to therapy with increased LE fatigue at end of session.         PT Education - 01/28/17 3474  Education provided Yes   Education Details form/technique with exercise   Person(s) Educated Patient   Methods Explanation;Demonstration   Comprehension Verbalized understanding;Returned demonstration             PT Long Term Goals - 01/14/17 1130      PT LONG TERM GOAL #1   Title Pt will be independent with HEP in order to improve strength and balance in order to decrease fall risk and improve function at home and work.    Time 8   Period Weeks   Status Achieved     PT LONG TERM GOAL #2   Title Pt will improve BERG by at least 3 points in order to demonstrate clinically significant improvement in balance.     Baseline 10/19/16: 44/56, 11/26/16 -- 47/56; 01/14/17: 49/56   Time 8   Period Weeks   Status Achieved     PT LONG TERM GOAL #3   Title Pt will be able to perform  sit to stand from regular height chair x 10 without UE support to improve functional independence when raising up from low surfaces such as commode   Baseline 10/19/16: unable to perform sit to stand without UE support -- 5/10 still unable to complete 1 without use of UEs. 01/14/2017: able to perform x17 with airex on chair    Time 8   Period Weeks   Status On-going     PT LONG TERM GOAL #4   Title Pt will improve ABC by at least 13% in order to demonstrate clinically significant improvement in balance confidence.    Baseline 10/19/16: 46.88% 01/14/2017: 65%   Time 8   Period Weeks   Status Achieved               Plan - 01/28/17 1006    Clinical Impression Statement Pt demonstrates improvement sit to stand ability with ability to perform greater reps before fatigue. Focused on performing LE strengthening and balance to decrease risk of falls and patient will benefit from further skilled therapy to return to prior level of function.    Rehab Potential Good   Clinical Impairments Affecting Rehab Potential Positive: motivation; Negative: possible decreased foot sensation, age   PT Frequency 2x / week   PT Duration 8 weeks   PT Treatment/Interventions ADLs/Self Care Home Management;Aquatic Therapy;Biofeedback;DME Instruction;Gait training;Stair training;Functional mobility training;Therapeutic activities;Therapeutic exercise;Balance training;Neuromuscular re-education;Patient/family education;Passive range of motion;Energy conservation   PT Next Visit Plan Progress balance and strengthening exercises,    PT Home Exercise Plan modified tandem balance, sit to stand from elevated chair without UE support, standing yellow tband hip abduction, hooklying bridges   Consulted and Agree with Plan of Care Patient      Patient will benefit from skilled therapeutic intervention in order to improve the following deficits and impairments:  Abnormal gait, Pain, Decreased balance  Visit  Diagnosis: Unsteadiness on feet  Muscle weakness (generalized)  History of falling     Problem List Patient Active Problem List   Diagnosis Date Noted  . Diarrhea 05/18/2016  . Fall 05/01/2016  . Unintentional weight loss 12/30/2015  . Leg weakness, bilateral 12/30/2015  . Hyperkalemia 01/01/2015  . Occasional numbness/prickling/tingling of fingers and toes 12/18/2014  . Routine general medical examination at a health care facility 12/13/2013  . Macrocytic anemia 05/19/2013  . 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 Psoriasis   . Osteoarthritis, multiple sites   . Episodic mood disorder ([place]) 12/01/2011  . History of breast cancer 10/28/2011  . GERD (  gastroesophageal reflux disease)   . Hyperlipidemia   . Hypertension   . Osteopenia   . Occlusion and stenosis of carotid artery without mention of cerebral infarction 05/27/2011    Blythe Stanford, PT DPT 01/28/2017, 10:25 AM  [facility] [address] [place], [address] Phone: [phone]   Fax:  [phone]  Name: [name] MRN: [id number] Date of Birth: [birth year]

## 2017-02-01 ENCOUNTER — Ambulatory Visit: Payer: Medicare Other

## 2017-02-01 DIAGNOSIS — R2681 Unsteadiness on feet: Secondary | ICD-10-CM

## 2017-02-01 DIAGNOSIS — M6281 Muscle weakness (generalized): Secondary | ICD-10-CM | POA: Diagnosis not present

## 2017-02-01 DIAGNOSIS — Z9181 History of falling: Secondary | ICD-10-CM

## 2017-02-01 NOTE — Therapy (Signed)
[facility] November 09, [address], [place], [address] Phone: [phone]   Fax:  [phone]  Physical Therapy Treatment  Patient Details  Name: [name] MRN: [id number] Date of Birth: 17-Mar-1941 Referring Provider: Dr. Caryl Bis  Encounter Date: 02/01/2017      PT End of Session - 02/01/17 1153    Visit Number 22   Number of Visits 25   Date for PT Re-Evaluation 2017-03-07   Authorization Type g codes Nov 21, 2022   PT Start Time 1116-11-09   PT Stop Time 1155/11/10   PT Time Calculation (min) 39 min   Equipment Utilized During Treatment Gait belt   Activity Tolerance Patient tolerated treatment well   Behavior During Therapy [facility] for tasks assessed/performed      Past Medical History:  Diagnosis Date  . Breast cancer ([place]) November 10, 1999   left breast  . Cancer ([place]) 10-Nov-1999   left breast ca  . GERD (gastroesophageal reflux disease)   . Hyperlipidemia   . Hypertension   . Osteoarthritis, multiple sites   . Osteoarthrosis involving, or with mention of more than one site, but not specified as generalized, multiple sites   . Osteopenia   . Personal history of radiation therapy 11/10/1999   left breast ca  . Psoriasis     Past Surgical History:  Procedure Laterality Date  . ABDOMINAL HYSTERECTOMY    . ANKLE FRACTURE SURGERY  4/08   left---hardware still in place  . BREAST BIOPSY Left 11/10/1999   breast ca  . BREAST EXCISIONAL BIOPSY Left yrs ago   benign  . BREAST LUMPECTOMY  11/10/99   left  . CAROTID ENDARTERECTOMY Left 10/23/2004  . FRACTURE SURGERY    . SHOULDER SURGERY  6/07   left  . TONSILLECTOMY AND ADENOIDECTOMY    . TOTAL HIP ARTHROPLASTY  11-10-2002   right    There were no vitals filed for this visit.      Subjective Assessment - 02/01/17 1146    Subjective Patient reports no major changes since the previous visit. Patient states she feels getting better.   Pertinent History Pt reports that she has noticed worsening of  her balance recently. She has had approximately 4 falls in the last 6 months. No known pattern to her falls. Pt reports that she gets in a rush and then she falls. Denies dizziness. Denies motion sensitivity. She has hardware in her L ankle from a fracture of both her tibia and fibula secondary to a fall. She notices worse balance in the AM. 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 No/denies      TREATMENT: Nustep - 53min, level 6 at seat level 9 with cueing on speed of performance Sit to stands -- x15, x15 with UE support Step ups onto 6" step - x20 B  Hip abduction off of balance stone - x 20 B Standing marches on balance stones - x 20 B  Weight shifting on balance stones - x 2 min Hip machine abduction-2 x 15 40# Standing rotational cone taps from airex pad - x90 sec Heel raises off of step - 2 x 10   Patient responds well to therapy with increased LE fatigue at end of session.       PT Education - 02/01/17 1153    Education provided  Yes   Education Details form/tehcnique with exercise   Person(s) Educated Patient   Methods Explanation;Demonstration   Comprehension Verbalized understanding;Returned demonstration             PT Long Term Goals - 01/14/17 1130      PT LONG TERM GOAL #1   Title Pt will be independent with HEP in order to improve strength and balance in order to decrease fall risk and improve function at home and work.    Time 8   Period Weeks   Status Achieved     PT LONG TERM GOAL #2   Title Pt will improve BERG by at least 3 points in order to demonstrate clinically significant improvement in balance.     Baseline 10/19/16: 44/56, 11/26/16 -- 47/56; 01/14/17: 49/56   Time 8   Period Weeks   Status Achieved     PT LONG TERM GOAL #3   Title Pt will be able to perform sit to stand from regular height  chair x 10 without UE support to improve functional independence when raising up from low surfaces such as commode   Baseline 10/19/16: unable to perform sit to stand without UE support -- 5/10 still unable to complete 1 without use of UEs. 01/14/2017: able to perform x17 with airex on chair    Time 8   Period Weeks   Status On-going     PT LONG TERM GOAL #4   Title Pt will improve ABC by at least 13% in order to demonstrate clinically significant improvement in balance confidence.    Baseline 10/19/16: 46.88% 01/14/2017: 65%   Time 8   Period Weeks   Status Achieved               Plan - 02/01/17 1155    Clinical Impression Statement Continued to advance exercises with patient performing greater amount of exercise repetions and resistance. Although patient is improving, she continues to demonstrate decreased LE strength and coordination. Patient will benefit from further skilled therapy to return to prior level of function.    Rehab Potential Good   Clinical Impairments Affecting Rehab Potential Positive: motivation; Negative: possible decreased foot sensation, age   PT Frequency 2x / week   PT Duration 8 weeks   PT Treatment/Interventions ADLs/Self Care Home Management;Aquatic Therapy;Biofeedback;DME Instruction;Gait training;Stair training;Functional mobility training;Therapeutic activities;Therapeutic exercise;Balance training;Neuromuscular re-education;Patient/family education;Passive range of motion;Energy conservation   PT Next Visit Plan Progress balance and strengthening exercises,    PT Home Exercise Plan modified tandem balance, sit to stand from elevated chair without UE support, standing yellow tband hip abduction, hooklying bridges   Consulted and Agree with Plan of Care Patient      Patient will benefit from skilled therapeutic intervention in order to improve the following deficits and impairments:  Abnormal gait, Pain, Decreased balance  Visit Diagnosis: Unsteadiness  on feet  Muscle weakness (generalized)  History of falling     Problem List Patient Active Problem List   Diagnosis Date Noted  . Diarrhea 05/18/2016  . Fall 05/01/2016  . Unintentional weight loss 12/30/2015  . Leg weakness, bilateral 12/30/2015  . Hyperkalemia 01/01/2015  . Occasional numbness/prickling/tingling of fingers and toes 12/18/2014  . Routine general medical examination at a health care facility 12/13/2013  . Macrocytic anemia 05/19/2013  . 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 Psoriasis   . Osteoarthritis, multiple sites   . Episodic mood disorder ([place]) 12/01/2011  . History of breast cancer 10/28/2011  . GERD (gastroesophageal reflux disease)   .  Hyperlipidemia   . Hypertension   . Osteopenia   . Occlusion and stenosis of carotid artery without mention of cerebral infarction 05/27/2011    Blythe Stanford, PT DPT 02/01/2017, 12:01 PM  [facility] [address], [place], [address] Phone: [phone]   Fax:  [phone]  Name: [name] MRN: [id number] Date of Birth: [birth year]

## 2017-02-04 ENCOUNTER — Ambulatory Visit: Payer: Medicare Other

## 2017-02-04 DIAGNOSIS — Z9181 History of falling: Secondary | ICD-10-CM | POA: Diagnosis not present

## 2017-02-04 DIAGNOSIS — M6281 Muscle weakness (generalized): Secondary | ICD-10-CM | POA: Diagnosis not present

## 2017-02-04 DIAGNOSIS — R2681 Unsteadiness on feet: Secondary | ICD-10-CM

## 2017-02-04 NOTE — Therapy (Signed)
[facility] [address], [place], [address] Phone: [phone]   Fax:  [phone]  Physical Therapy Treatment  Patient Details  Name: [name] MRN: [id number] Date of Birth: 1941/07/13 Referring Provider: Dr. Caryl Bis  Encounter Date: 02/04/2017      PT End of Session - 02/04/17 1405    Visit Number 23   Number of Visits 25   Date for PT Re-Evaluation 13-Mar-2017   Authorization Type g codes 5/10   PT Start Time 1345   PT Stop Time 1430   PT Time Calculation (min) 45 min   Equipment Utilized During Treatment Gait belt   Activity Tolerance Patient tolerated treatment well   Behavior During Therapy [facility] for tasks assessed/performed      Past Medical History:  Diagnosis Date  . Breast cancer ([place]) 2001   left breast  . Cancer ([place]) 2001   left breast ca  . GERD (gastroesophageal reflux disease)   . Hyperlipidemia   . Hypertension   . Osteoarthritis, multiple sites   . Osteoarthrosis involving, or with mention of more than one site, but not specified as generalized, multiple sites   . Osteopenia   . Personal history of radiation therapy 2001   left breast ca  . Psoriasis     Past Surgical History:  Procedure Laterality Date  . ABDOMINAL HYSTERECTOMY    . ANKLE FRACTURE SURGERY  4/08   left---hardware still in place  . 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 BREAST EXCISIONAL BIOPSY Left yrs ago   benign  . BREAST LUMPECTOMY  2001   left  . CAROTID ENDARTERECTOMY Left 10/23/2004  . FRACTURE SURGERY    . SHOULDER SURGERY  6/07   left  . TONSILLECTOMY AND ADENOIDECTOMY    . TOTAL HIP ARTHROPLASTY  2004   right    There were no vitals filed for this visit.      Subjective Assessment - 02/04/17 1354    Subjective Patient reports no major changes since the previous visit. Patient reports no significant soreness after the previous visit.    Pertinent History Pt reports that she  has noticed worsening of her balance recently. She has had approximately 4 falls in the last 6 months. No known pattern to her falls. Pt reports that she gets in a rush and then she falls. Denies dizziness. Denies motion sensitivity. She has hardware in her L ankle from a fracture of both her tibia and fibula secondary to a fall. She notices worse balance in the AM. 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 No/denies      TREATMENT: Hip machine abduction-2 x 15 40# Total Gym squats at level 21 - 3 x 20 with focus on improving quadriceps strength/endurance Nustep - 59min, level 4 at seat level 9 with cueing on speed of performance without UE support Heel taps off of 4" step - 2 x 20 Side Stepping at airex beam without UE support - x6 down and back Heel raises off of airex pad - x 20; off of 4" step - x20  Sit to stands - x5 with UE support; 2 x 15 with airex pad on chair   Patient responds well to therapy with increased LE fatigue at end of session.  PT Education - 02/04/17 1405    Education provided Yes   Education Details form/technique with exercise   Person(s) Educated Patient   Methods Explanation;Demonstration   Comprehension Verbalized understanding;Returned demonstration             PT Long Term Goals - 01/14/17 1130      PT LONG TERM GOAL #1   Title Pt will be independent with HEP in order to improve strength and balance in order to decrease fall risk and improve function at home and work.    Time 8   Period Weeks   Status Achieved     PT LONG TERM GOAL #2   Title Pt will improve BERG by at least 3 points in order to demonstrate clinically significant improvement in balance.     Baseline 10/19/16: 44/56, 11/26/16 -- 47/56; 01/14/17: 49/56   Time 8   Period Weeks   Status Achieved     PT LONG TERM GOAL #3    Title Pt will be able to perform sit to stand from regular height chair x 10 without UE support to improve functional independence when raising up from low surfaces such as commode   Baseline 10/19/16: unable to perform sit to stand without UE support -- 5/10 still unable to complete 1 without use of UEs. 01/14/2017: able to perform x17 with airex on chair    Time 8   Period Weeks   Status On-going     PT LONG TERM GOAL #4   Title Pt will improve ABC by at least 13% in order to demonstrate clinically significant improvement in balance confidence.    Baseline 10/19/16: 46.88% 01/14/2017: 65%   Time 8   Period Weeks   Status Achieved               Plan - 02/04/17 1412    Clinical Impression Statement Focused on improving quadriceps and hip strength to improve sit to stand ability and provide greater muscular stability when performing ambulation and standing. Patient demonstrates increased hip adduction and forward knee translation with exercise performance indicating decreased motor control and strength; patient will benefit from further skilled therapy to return to prior of level of function and improve balance.     Rehab Potential Good   Clinical Impairments Affecting Rehab Potential Positive: motivation; Negative: possible decreased foot sensation, age   PT Frequency 2x / week   PT Duration 8 weeks   PT Treatment/Interventions ADLs/Self Care Home Management;Aquatic Therapy;Biofeedback;DME Instruction;Gait training;Stair training;Functional mobility training;Therapeutic activities;Therapeutic exercise;Balance training;Neuromuscular re-education;Patient/family education;Passive range of motion;Energy conservation   PT Next Visit Plan Progress balance and strengthening exercises,    PT Home Exercise Plan modified tandem balance, sit to stand from elevated chair without UE support, standing yellow tband hip abduction, hooklying bridges   Consulted and Agree with Plan of Care Patient       Patient will benefit from skilled therapeutic intervention in order to improve the following deficits and impairments:  Abnormal gait, Pain, Decreased balance  Visit Diagnosis: Unsteadiness on feet  Muscle weakness (generalized)  History of falling     Problem List Patient Active Problem List   Diagnosis Date Noted  . Diarrhea 05/18/2016  . Fall 05/01/2016  . Unintentional weight loss 12/30/2015  . Leg weakness, bilateral 12/30/2015  . Hyperkalemia 01/01/2015  . Occasional numbness/prickling/tingling of fingers and toes 12/18/2014  . Routine general medical examination at a health care facility 12/13/2013  . Macrocytic anemia 05/19/2013  . 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  Psoriasis   . Osteoarthritis, multiple sites   . Episodic mood disorder ([place]) 12/01/2011  . History of breast cancer 10/28/2011  . GERD (gastroesophageal reflux disease)   . Hyperlipidemia   . Hypertension   . Osteopenia   . Occlusion and stenosis of carotid artery without mention of cerebral infarction 05/27/2011    Blythe Stanford, PT DPT 02/04/2017, 2:24 PM  [facility] [address], [place], [address] Phone: [phone]   Fax:  [phone]  Name: [name] MRN: [id number] Date of Birth: 1941-02-09

## 2017-02-08 ENCOUNTER — Ambulatory Visit: Payer: Medicare Other

## 2017-02-08 DIAGNOSIS — M6281 Muscle weakness (generalized): Secondary | ICD-10-CM

## 2017-02-08 DIAGNOSIS — R2681 Unsteadiness on feet: Secondary | ICD-10-CM | POA: Diagnosis not present

## 2017-02-08 DIAGNOSIS — Z9181 History of falling: Secondary | ICD-10-CM

## 2017-02-08 NOTE — Therapy (Signed)
[facility] [address] [place], [address] Phone: [phone]   Fax:  [phone]  Physical Therapy Treatment  Patient Details  Name: [name] MRN: [id number] Date of Birth: July 18, 1941 Referring Provider: Dr. Caryl Bis  Encounter Date: 02/08/2017      PT End of Session - 02/08/17 1413    Visit Number 24   Number of Visits 25   Date for PT Re-Evaluation February 22, 2017   Authorization Type g codes 6/10   PT Start Time 3557   PT Stop Time 1430   PT Time Calculation (min) 45 min   Equipment Utilized During Treatment Gait belt   Activity Tolerance Patient tolerated treatment well   Behavior During Therapy [facility] for tasks assessed/performed      Past Medical History:  Diagnosis Date  . Breast cancer ([place]) 2001   left breast  . Cancer ([place]) 2001   left breast ca  . GERD (gastroesophageal reflux disease)   . Hyperlipidemia   . Hypertension   . Osteoarthritis, multiple sites   . Osteoarthrosis involving, or with mention of more than one site, but not specified as generalized, multiple sites   . Osteopenia   . Personal history of radiation therapy 2001   left breast ca  . Psoriasis     Past Surgical History:  Procedure Laterality Date  . ABDOMINAL HYSTERECTOMY    . ANKLE FRACTURE SURGERY  4/08   left---hardware still in place  . 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 BREAST EXCISIONAL BIOPSY Left yrs ago   benign  . BREAST LUMPECTOMY  2001   left  . CAROTID ENDARTERECTOMY Left 10/23/2004  . FRACTURE SURGERY    . SHOULDER SURGERY  6/07   left  . TONSILLECTOMY AND ADENOIDECTOMY    . TOTAL HIP ARTHROPLASTY  2004   right    There were no vitals filed for this visit.      Subjective Assessment - 02/08/17 1408    Subjective Patient reports she feels increased stiffness in her hips and low back. Patient reports no soreness since the previous visit.    Pertinent History Pt reports that she  has noticed worsening of her balance recently. She has had approximately 4 falls in the last 6 months. No known pattern to her falls. Pt reports that she gets in a rush and then she falls. Denies dizziness. Denies motion sensitivity. She has hardware in her L ankle from a fracture of both her tibia and fibula secondary to a fall. She notices worse balance in the AM. 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 No/denies        TREATMENT: Nustep - 38min, level 3 at seat level 9 with cueing on speed of performance without UE support CKC lumbar extension in standing - x15 with 3 sec hold Lumbar extension tandem - x15 with 3 sec holds  Hip adduction against ball with glute squeeze - x 15 Feet together on large blue dynadisc - x 20sec x 3 Step taps laterally for single leg - x 10  Step taps onto 6 " step - x 2 min Step ups onto 6" step - 2 x 10  Side stepping onto 6" step - x15 B  Marching on balance stones with UE support - x 10 Sit  to stands - x 15 with airex pad on chair     Patient responds well to therapy with increased LE fatigue at end of session.        PT Education - 02/08/17 1410    Education provided Yes   Education Details form/technique with exercise   Person(s) Educated Patient   Methods Explanation;Demonstration   Comprehension Verbalized understanding;Returned demonstration             PT Long Term Goals - 01/14/17 1130      PT LONG TERM GOAL #1   Title Pt will be independent with HEP in order to improve strength and balance in order to decrease fall risk and improve function at home and work.    Time 8   Period Weeks   Status Achieved     PT LONG TERM GOAL #2   Title Pt will improve BERG by at least 3 points in order to demonstrate clinically significant improvement in balance.     Baseline 10/19/16: 44/56,  11/26/16 -- 47/56; 01/14/17: 49/56   Time 8   Period Weeks   Status Achieved     PT LONG TERM GOAL #3   Title Pt will be able to perform sit to stand from regular height chair x 10 without UE support to improve functional independence when raising up from low surfaces such as commode   Baseline 10/19/16: unable to perform sit to stand without UE support -- 5/10 still unable to complete 1 without use of UEs. 01/14/2017: able to perform x17 with airex on chair    Time 8   Period Weeks   Status On-going     PT LONG TERM GOAL #4   Title Pt will improve ABC by at least 13% in order to demonstrate clinically significant improvement in balance confidence.    Baseline 10/19/16: 46.88% 01/14/2017: 65%   Time 8   Period Weeks   Status Achieved               Plan - 02/08/17 1415    Clinical Impression Statement Focused the start of the session on improving hip/lumbar mobility to improve upon patient's stiffness; then focused on improving hip and LE strength to further improve standing and walking ability. Patient will benefit from further skilled therapy to retrun to prior level of function.    Rehab Potential Good   Clinical Impairments Affecting Rehab Potential Positive: motivation; Negative: possible decreased foot sensation, age   PT Frequency 2x / week   PT Duration 8 weeks   PT Treatment/Interventions ADLs/Self Care Home Management;Aquatic Therapy;Biofeedback;DME Instruction;Gait training;Stair training;Functional mobility training;Therapeutic activities;Therapeutic exercise;Balance training;Neuromuscular re-education;Patient/family education;Passive range of motion;Energy conservation   PT Next Visit Plan Progress balance and strengthening exercises,    PT Home Exercise Plan modified tandem balance, sit to stand from elevated chair without UE support, standing yellow tband hip abduction, hooklying bridges   Consulted and Agree with Plan of Care Patient      Patient will benefit from  skilled therapeutic intervention in order to improve the following deficits and impairments:  Abnormal gait, Pain, Decreased balance  Visit Diagnosis: Unsteadiness on feet  Muscle weakness (generalized)  History of falling     Problem List Patient Active Problem List   Diagnosis Date Noted  . Diarrhea 05/18/2016  . Fall 05/01/2016  . Unintentional weight loss 12/30/2015  . Leg weakness, bilateral 12/30/2015  . Hyperkalemia 01/01/2015  . Occasional numbness/prickling/tingling of fingers and toes 12/18/2014  . Routine general medical examination  at a health care facility 12/13/2013  . Macrocytic anemia 05/19/2013  . 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 Psoriasis   . Osteoarthritis, multiple sites   . Episodic mood disorder ([place]) 12/01/2011  . History of breast cancer 10/28/2011  . GERD (gastroesophageal reflux disease)   . Hyperlipidemia   . Hypertension   . Osteopenia   . Occlusion and stenosis of carotid artery without mention of cerebral infarction 05/27/2011    Blythe Stanford, PT DPT 02/08/2017, 2:33 PM  [facility] [address] [place], [address] Phone: [phone]   Fax:  [phone]  Name: SYMPHANY FLEISSNER MRN: [id number] Date of Birth: 04/21/1941

## 2017-02-11 ENCOUNTER — Ambulatory Visit: Payer: Medicare Other

## 2017-02-11 DIAGNOSIS — Z9181 History of falling: Secondary | ICD-10-CM

## 2017-02-11 DIAGNOSIS — R2681 Unsteadiness on feet: Secondary | ICD-10-CM

## 2017-02-11 DIAGNOSIS — M6281 Muscle weakness (generalized): Secondary | ICD-10-CM

## 2017-02-11 NOTE — Therapy (Signed)
[facility] [address] [place], [address] Phone: [phone]   Fax:  [phone]  Physical Therapy Treatment  Patient Details  Name: [name] MRN: [id number] Date of Birth: 20-Oct-1940 Referring Provider: Dr. Caryl Bis  Encounter Date: February 25, 2017      PT End of Session - 02/25/17 1009    Visit Number 25   Number of Visits 25   Date for PT Re-Evaluation 02-25-2017   Authorization Type g codes 7/10   PT Start Time 0945   PT Stop Time 1030   PT Time Calculation (min) 45 min   Equipment Utilized During Treatment Gait belt   Activity Tolerance Patient tolerated treatment well   Behavior During Therapy [facility] for tasks assessed/performed      Past Medical History:  Diagnosis Date  . Breast cancer ([place]) 2001   left breast  . Cancer ([place]) 2001   left breast ca  . GERD (gastroesophageal reflux disease)   . Hyperlipidemia   . Hypertension   . Osteoarthritis, multiple sites   . Osteoarthrosis involving, or with mention of more than one site, but not specified as generalized, multiple sites   . Osteopenia   . Personal history of radiation therapy 2001   left breast ca  . Psoriasis     Past Surgical History:  Procedure Laterality Date  . ABDOMINAL HYSTERECTOMY    . ANKLE FRACTURE SURGERY  4/08   left---hardware still in place  . 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 BREAST EXCISIONAL BIOPSY Left yrs ago   benign  . BREAST LUMPECTOMY  2001   left  . CAROTID ENDARTERECTOMY Left 10/23/2004  . FRACTURE SURGERY    . SHOULDER SURGERY  6/07   left  . TONSILLECTOMY AND ADENOIDECTOMY    . TOTAL HIP ARTHROPLASTY  2004   right    There were no vitals filed for this visit.      Subjective Assessment - February 25, 2017 0956    Subjective Patient reports she continues to have increased stiffness in her hip and her low back. Patient states no major changes since the previous visit.    Pertinent History  Pt reports that she has noticed worsening of her balance recently. She has had approximately 4 falls in the last 6 months. No known pattern to her falls. Pt reports that she gets in a rush and then she falls. Denies dizziness. Denies motion sensitivity. She has hardware in her L ankle from a fracture of both her tibia and fibula secondary to a fall. She notices worse balance in the AM. 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 No/denies        TREATMENT: Nustep - 71min, level (4) 2.86min; level (3) 2.26min at seat level 9 with cueing on speed of performance without UE support Hip abduction in standing - 2 x 20  Side Stepping up and down - x20 with UE support Stepping out in all directions with single leg on airex pad - x 10 B Rotational step taps onto cones from airex pad - x 20  Sit to stands - 2 x 15 with airex pad on chair  Heel raises/ toe raises in standing - 2 x 20  Side stepping with agility ladder - x 7 down and back  Patient responds well to therapy with increased LE fatigue at end of session.       PT Education - 02/11/17 1008    Education provided Yes   Education Details form/technique with exercise   Person(s) Educated Patient   Methods Demonstration;Explanation   Comprehension Verbalized understanding;Returned demonstration             PT Long Term Goals - 01/14/17 1130      PT LONG TERM GOAL #1   Title Pt will be independent with HEP in order to improve strength and balance in order to decrease fall risk and improve function at home and work.    Time 8   Period Weeks   Status Achieved     PT LONG TERM GOAL #2   Title Pt will improve BERG by at least 3 points in order to demonstrate clinically significant improvement in balance.     Baseline 10/19/16: 44/56, 11/26/16 -- 47/56; 01/14/17: 49/56   Time 8    Period Weeks   Status Achieved     PT LONG TERM GOAL #3   Title Pt will be able to perform sit to stand from regular height chair x 10 without UE support to improve functional independence when raising up from low surfaces such as commode   Baseline 10/19/16: unable to perform sit to stand without UE support -- 5/10 still unable to complete 1 without use of UEs. 01/14/2017: able to perform x17 with airex on chair    Time 8   Period Weeks   Status On-going     PT LONG TERM GOAL #4   Title Pt will improve ABC by at least 13% in order to demonstrate clinically significant improvement in balance confidence.    Baseline 10/19/16: 46.88% 01/14/2017: 65%   Time 8   Period Weeks   Status Achieved               Plan - 02/11/17 1010    Clinical Impression Statement Patient demonstrates increased fatigue along her hips with exercise performance indicating decreased muscular endurance/strength. Conitnued to focus on improving quadricep/hip strength to allow for greater ability to perform sit to stand exercise. Patient continues to demonstrate decreased balance and strength with exercises and will benefit from further skilled therapy to return to prior level of function.    Rehab Potential Good   Clinical Impairments Affecting Rehab Potential Positive: motivation; Negative: possible decreased foot sensation, age   PT Frequency 2x / week   PT Duration 8 weeks   PT Treatment/Interventions ADLs/Self Care Home Management;Aquatic Therapy;Biofeedback;DME Instruction;Gait training;Stair training;Functional mobility training;Therapeutic activities;Therapeutic exercise;Balance training;Neuromuscular re-education;Patient/family education;Passive range of motion;Energy conservation   PT Next Visit Plan Progress balance and strengthening exercises,    PT Home Exercise Plan modified tandem balance, sit to stand from elevated chair without UE support, standing yellow tband hip abduction, hooklying bridges    Consulted and Agree with Plan of Care Patient      Patient will benefit from skilled therapeutic intervention in order to improve the following deficits and impairments:  Abnormal gait, Pain, Decreased balance  Visit Diagnosis: Unsteadiness on feet  Muscle weakness (generalized)  History of falling     Problem List Patient Active Problem List   Diagnosis Date Noted  . Diarrhea 05/18/2016  . Fall 05/01/2016  . Unintentional weight loss 12/30/2015  . Leg weakness, bilateral 12/30/2015  . Hyperkalemia 01/01/2015  . Occasional numbness/prickling/tingling of fingers and toes 12/18/2014  . Routine general medical examination at a health  care facility 12/13/2013  . Macrocytic anemia 05/19/2013  . 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 Psoriasis   . Osteoarthritis, multiple sites   . Episodic mood disorder ([place]) 12/01/2011  . History of breast cancer 10/28/2011  . GERD (gastroesophageal reflux disease)   . Hyperlipidemia   . Hypertension   . Osteopenia   . Occlusion and stenosis of carotid artery without mention of cerebral infarction 05/27/2011    Blythe Stanford, PT DPT 02/11/2017, 10:27 AM  [facility] [address] [place], [address] Phone: [phone]   Fax:  [phone]  Name: [name] MRN: [id number] Date of Birth: 10-01-1940

## 2017-02-15 ENCOUNTER — Ambulatory Visit: Payer: Medicare Other

## 2017-02-15 DIAGNOSIS — R2681 Unsteadiness on feet: Secondary | ICD-10-CM

## 2017-02-15 DIAGNOSIS — M6281 Muscle weakness (generalized): Secondary | ICD-10-CM | POA: Diagnosis not present

## 2017-02-15 DIAGNOSIS — Z9181 History of falling: Secondary | ICD-10-CM | POA: Diagnosis not present

## 2017-02-15 NOTE — Addendum Note (Signed)
Addended by: Blain Pais on: 02/15/2017 11:25 AM   Modules accepted: Orders

## 2017-02-15 NOTE — Therapy (Signed)
[place] [address] [place], [address] Phone: [phone]   Fax:  [phone]  Physical Therapy Treatment  Patient Details  Name: [name] MRN: [id number] Date of Birth: 29-Jan-1941 Referring Provider: Dr. Caryl Bis  Encounter Date: 02/15/2017      PT End of Session - 02/15/17 1108    Visit Number 26   Number of Visits 34   Date for PT Re-Evaluation 2017/03/03   Authorization Type g codes 1/10   PT Start Time 1030   PT Stop Time 1112   PT Time Calculation (min) 42 min   Equipment Utilized During Treatment Gait belt   Activity Tolerance Patient tolerated treatment well   Behavior During Therapy [facility] for tasks assessed/performed      Past Medical History:  Diagnosis Date  . Breast cancer ([place]) 2001   left breast  . Cancer ([place]) 2001   left breast ca  . GERD (gastroesophageal reflux disease)   . Hyperlipidemia   . Hypertension   . Osteoarthritis, multiple sites   . Osteoarthrosis involving, or with mention of more than one site, but not specified as generalized, multiple sites   . Osteopenia   . Personal history of radiation therapy 2001   left breast ca  . Psoriasis     Past Surgical History:  Procedure Laterality Date  . ABDOMINAL HYSTERECTOMY    . ANKLE FRACTURE SURGERY  4/08   left---hardware still in place  . 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 BREAST EXCISIONAL BIOPSY Left yrs ago   benign  . BREAST LUMPECTOMY  2001   left  . CAROTID ENDARTERECTOMY Left 10/23/2004  . FRACTURE SURGERY    . SHOULDER SURGERY  6/07   left  . TONSILLECTOMY AND ADENOIDECTOMY    . TOTAL HIP ARTHROPLASTY  2004   right    There were no vitals filed for this visit.      Subjective Assessment - 02/15/17 1038    Subjective Patient reports no major changes and patient reports her stiffness in her hip and low improves as the day progresses.    Pertinent History Pt reports that she has  noticed worsening of her balance recently. She has had approximately 4 falls in the last 6 months. No known pattern to her falls. Pt reports that she gets in a rush and then she falls. Denies dizziness. Denies motion sensitivity. She has hardware in her L ankle from a fracture of both her tibia and fibula secondary to a fall. She notices worse balance in the AM. 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 No/denies         TREATMENT: Nustep - 57min, level (2) 5 min at seat level 9 with cueing on speed of performance without UE support Sit to stand from chair with airex pad - x 10, with pillow on chair - x10 Single leg stance B - x 68min B Hip abduction at hip machine in standing - 2 x 20 25# Stepping out in all directions - x 10 B Standing feet together on half foam with intermittent UE support - x20 Side stepping down and back airex beam - x5 without UE support   Patient responds well to therapy with increased LE fatigue at end of session.  PT Long Term Goals - 03/10/2017 1049      PT LONG TERM GOAL #1   Title Pt will be independent with HEP in order to improve strength and balance in order to decrease fall risk and improve function at home and work.    Time 8   Period Weeks   Status Achieved     PT LONG TERM GOAL #2   Title Pt will improve BERG by at least 3 points in order to demonstrate clinically significant improvement in balance.     Baseline 10/19/16: 44/56, 11/26/16 -- 47/56; 01/14/17: 49/56   Time 8   Period Weeks   Status Achieved     PT LONG TERM GOAL #3   Title Pt will be able to perform sit to stand from regular height chair x 10 without UE support to improve functional independence when raising up from low surfaces such as commode   Baseline 10/19/16: unable to perform sit to stand without UE support -- 5/10 still  unable to complete 1 without use of UEs. 01/14/2017: able to perform x17 with airex on chair March 10, 2017: Able to perform sit to stand -- x12 with pillow on standard chair    Time 8   Period Weeks   Status On-going     PT LONG TERM GOAL #4   Title Pt will improve ABC by at least 13% in order to demonstrate clinically significant improvement in balance confidence.    Baseline 10/19/16: 46.88% 01/14/2017: 65%   Time 8   Period Weeks   Status Achieved     PT LONG TERM GOAL #5   Title Patient will improve SLS balance to over 10secs B to improve balance while high cabinets and decreasing fall risk.    Baseline 2 sec B   Time 6   Period Weeks   Status New               Plan - 03-10-17 1112    Clinical Impression Statement Patient demonstrates improvement with sit to stand ability and single leg stance compared to beginning of therapy indicating functional improvement in LE strength and static balance. Although patient is improving, she continues to demonstrates decreased hip and LE strength with inability to perform sit to stand without use of UEs. Patient will benefit from further skilled therapy to return to prior level of function.    Rehab Potential Good   Clinical Impairments Affecting Rehab Potential Positive: motivation; Negative: possible decreased foot sensation, age   PT Frequency 2x / week   PT Duration 8 weeks   PT Treatment/Interventions ADLs/Self Care Home Management;Aquatic Therapy;Biofeedback;DME Instruction;Gait training;Stair training;Functional mobility training;Therapeutic activities;Therapeutic exercise;Balance training;Neuromuscular re-education;Patient/family education;Passive range of motion;Energy conservation   PT Next Visit Plan Progress balance and strengthening exercises,    PT Home Exercise Plan modified tandem balance, sit to stand from elevated chair without UE support, standing yellow tband hip abduction, hooklying bridges   Consulted and Agree with Plan of  Care Patient      Patient will benefit from skilled therapeutic intervention in order to improve the following deficits and impairments:  Abnormal gait, Pain, Decreased balance  Visit Diagnosis: Unsteadiness on feet  Muscle weakness (generalized)       G-Codes - March 10, 2017 1114    Functional Assessment Tool Used (Outpatient Only) clinical judgement, Functional strength [id number]   Functional Limitation Mobility: Walking and moving around   Mobility: Walking and Moving Around Current Status ([id number]) At least 20 percent but less than 40 percent impaired,  limited or restricted   Mobility: Walking and Moving Around Goal Status [phone]) At least 20 percent but less than 40 percent impaired, limited or restricted      Problem List Patient Active Problem List   Diagnosis Date Noted  . Diarrhea 05/18/2016  . Fall 05/01/2016  . Unintentional weight loss 12/30/2015  . Leg weakness, bilateral 12/30/2015  . Hyperkalemia 01/01/2015  . Occasional numbness/prickling/tingling of fingers and toes 12/18/2014  . Routine general medical examination at a health care facility 12/13/2013  . Macrocytic anemia 05/19/2013  . 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 Psoriasis   . Osteoarthritis, multiple sites   . Episodic mood disorder ([place]) 12/01/2011  . History of breast cancer 10/28/2011  . GERD (gastroesophageal reflux disease)   . Hyperlipidemia   . Hypertension   . Osteopenia   . Occlusion and stenosis of carotid artery without mention of cerebral infarction 05/27/2011    Blythe Stanford, PT DPT 02/15/2017, 11:15 AM  [place] [address] [place], [address] Phone: [phone]   Fax:  [phone]  Name: ALBANY WINSLOW MRN: [id number] Date of Birth: 01/10/1941

## 2017-02-17 ENCOUNTER — Ambulatory Visit: Payer: Medicare Other | Attending: Family Medicine

## 2017-02-17 DIAGNOSIS — M6281 Muscle weakness (generalized): Secondary | ICD-10-CM | POA: Insufficient documentation

## 2017-02-17 DIAGNOSIS — R2681 Unsteadiness on feet: Secondary | ICD-10-CM | POA: Diagnosis not present

## 2017-02-17 DIAGNOSIS — Z9181 History of falling: Secondary | ICD-10-CM | POA: Diagnosis not present

## 2017-02-17 NOTE — Therapy (Signed)
[facility] [address], [place], [address] Phone: [phone]   Fax:  [phone]  Physical Therapy Treatment  Patient Details  Name: [name] MRN: [id number] Date of Birth: 25-Apr-1941 Referring Provider: Dr. Caryl Bis  Encounter Date: 02/17/2017      PT End of Session - 02/17/17 1553    Visit Number 27   Number of Visits 42   Date for PT Re-Evaluation 03/16/17   Authorization Type g codes Aug 30, 2022   PT Start Time 1545   PT Stop Time 1630   PT Time Calculation (min) 45 min   Equipment Utilized During Treatment Gait belt   Activity Tolerance Patient tolerated treatment well   Behavior During Therapy [facility] for tasks assessed/performed      Past Medical History:  Diagnosis Date  . Breast cancer ([place]) 2001   left breast  . Cancer ([place]) 2001   left breast ca  . GERD (gastroesophageal reflux disease)   . Hyperlipidemia   . Hypertension   . Osteoarthritis, multiple sites   . Osteoarthrosis involving, or with mention of more than one site, but not specified as generalized, multiple sites   . Osteopenia   . Personal history of radiation therapy 2001   left breast ca  . Psoriasis     Past Surgical History:  Procedure Laterality Date  . ABDOMINAL HYSTERECTOMY    . ANKLE FRACTURE SURGERY  4/08   left---hardware still in place  . 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 BREAST EXCISIONAL BIOPSY Left yrs ago   benign  . BREAST LUMPECTOMY  2001   left  . CAROTID ENDARTERECTOMY Left 10/23/2004  . FRACTURE SURGERY    . SHOULDER SURGERY  6/07   left  . TONSILLECTOMY AND ADENOIDECTOMY    . TOTAL HIP ARTHROPLASTY  2004   right    There were no vitals filed for this visit.      Subjective Assessment - 02/17/17 1559    Subjective Patient reports no major changes but reports she feels she's getting stronger.    Pertinent History Pt reports that she has noticed worsening of her balance recently.  She has had approximately 4 falls in the last 6 months. No known pattern to her falls. Pt reports that she gets in a rush and then she falls. Denies dizziness. Denies motion sensitivity. She has hardware in her L ankle from a fracture of both her tibia and fibula secondary to a fall. She notices worse balance in the AM. 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 No/denies         TREATMENT: Nustep - 102min, level (4) 3 min; level (3) 2 min at seat level 9 with cueing on speed of performance without UE support Leg Press at [facility] 55# -- 2 x 20 Side Stepping up and over BOSU ball - x 15 B Step ups onto BOSU ball - x 10  Side stepping down and back on Airex beam with airex pad underneath - x5  Butt kick on double airex pad with unilateral UE support - x 10  Feet together on double airex weight shifting - x 20  Hip abduction at Hip Machine - 2 x 10 40# Sit to stand from chair with pillow on chair - x10, x11 with pillow  on maximally lowered large table Side Step outs B with intermittent UE support - x 15    Patient responds well to therapy with increased LE fatigue at end of session.        PT Education - 02/17/17 1557    Education provided Yes   Education Details form/technique with exercise   Person(s) Educated Patient   Methods Explanation;Demonstration   Comprehension Verbalized understanding;Returned demonstration             PT Long Term Goals - 02/15/17 1049      PT LONG TERM GOAL #1   Title Pt will be independent with HEP in order to improve strength and balance in order to decrease fall risk and improve function at home and work.    Time 8   Period Weeks   Status Achieved     PT LONG TERM GOAL #2   Title Pt will improve BERG by at least 3 points in order to demonstrate clinically significant improvement in balance.      Baseline 10/19/16: 44/56, 11/26/16 -- 47/56; 01/14/17: 49/56   Time 8   Period Weeks   Status Achieved     PT LONG TERM GOAL #3   Title Pt will be able to perform sit to stand from regular height chair x 10 without UE support to improve functional independence when raising up from low surfaces such as commode   Baseline 10/19/16: unable to perform sit to stand without UE support -- 5/10 still unable to complete 1 without use of UEs. 01/14/2017: able to perform x17 with airex on chair 02/15/2017: Able to perform sit to stand -- x12 with pillow on standard chair    Time 8   Period Weeks   Status On-going     PT LONG TERM GOAL #4   Title Pt will improve ABC by at least 13% in order to demonstrate clinically significant improvement in balance confidence.    Baseline 10/19/16: 46.88% 01/14/2017: 65%   Time 8   Period Weeks   Status Achieved     PT LONG TERM GOAL #5   Title Patient will improve SLS balance to over 10secs B to improve balance while high cabinets and decreasing fall risk.    Baseline 2 sec B   Time 6   Period Weeks   Status New               Plan - 02/17/17 1601    Clinical Impression Statement Focused on advancing patient's exercises today trying to improve patient's quadriceps to allow for improvement in performing sit to stands. Although patient is improving, she continues to have difficulty performing sit to stand exercises and will benefit from further skilled therapy to return to prior level of function.    Rehab Potential Good   Clinical Impairments Affecting Rehab Potential Positive: motivation; Negative: possible decreased foot sensation, age   PT Frequency 2x / week   PT Duration 8 weeks   PT Treatment/Interventions ADLs/Self Care Home Management;Aquatic Therapy;Biofeedback;DME Instruction;Gait training;Stair training;Functional mobility training;Therapeutic activities;Therapeutic exercise;Balance training;Neuromuscular re-education;Patient/family education;Passive  range of motion;Energy conservation   PT Next Visit Plan Progress balance and strengthening exercises,    PT Home Exercise Plan modified tandem balance, sit to stand from elevated chair without UE support, standing yellow tband hip abduction, hooklying bridges   Consulted and Agree with Plan of Care Patient      Patient will benefit from skilled therapeutic intervention in order to improve the following deficits and impairments:  Abnormal gait, Pain, Decreased balance  Visit Diagnosis: Unsteadiness on feet  Muscle weakness (generalized)  History of falling     Problem List Patient Active Problem List   Diagnosis Date Noted  . Diarrhea 05/18/2016  . Fall 05/01/2016  . Unintentional weight loss 12/30/2015  . Leg weakness, bilateral 12/30/2015  . Hyperkalemia 01/01/2015  . Occasional numbness/prickling/tingling of fingers and toes 12/18/2014  . Routine general medical examination at a health care facility 12/13/2013  . Macrocytic anemia 05/19/2013  . 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 Psoriasis   . Osteoarthritis, multiple sites   . Episodic mood disorder ([place]) 12/01/2011  . History of breast cancer 10/28/2011  . GERD (gastroesophageal reflux disease)   . Hyperlipidemia   . Hypertension   . Osteopenia   . Occlusion and stenosis of carotid artery without mention of cerebral infarction 05/27/2011    Blythe Stanford, PT DPT 02/17/2017, 4:33 PM  [facility] [address] [place], [address] Phone: [phone]   Fax:  [phone]  Name: ROCKELLE HEUERMAN MRN: [id number] Date of Birth: 10-Feb-1941

## 2017-02-20 ENCOUNTER — Other Ambulatory Visit: Payer: Self-pay | Admitting: Internal Medicine

## 2017-02-23 ENCOUNTER — Ambulatory Visit: Payer: Medicare Other

## 2017-02-23 DIAGNOSIS — M6281 Muscle weakness (generalized): Secondary | ICD-10-CM | POA: Diagnosis not present

## 2017-02-23 DIAGNOSIS — R2681 Unsteadiness on feet: Secondary | ICD-10-CM

## 2017-02-23 DIAGNOSIS — Z9181 History of falling: Secondary | ICD-10-CM | POA: Diagnosis not present

## 2017-02-23 NOTE — Therapy (Signed)
[facility] [address], [place], [address] Phone: [phone]   Fax:  [phone]  Physical Therapy Treatment  Patient Details  Name: [name] MRN: [id number] Date of Birth: May 30, 1941 Referring Provider: Dr. Caryl Bis  Encounter Date: 02/23/2017      PT End of Session - 02/23/17 1010    Visit Number 28   Number of Visits 42   Date for PT Re-Evaluation 03/26/2017   Authorization Type g codes 2022-10-08   PT Start Time 0945   PT Stop Time 1030   PT Time Calculation (min) 45 min   Equipment Utilized During Treatment Gait belt   Activity Tolerance Patient tolerated treatment well   Behavior During Therapy [facility] for tasks assessed/performed      Past Medical History:  Diagnosis Date  . Breast cancer ([place]) 2001   left breast  . Cancer ([place]) 2001   left breast ca  . GERD (gastroesophageal reflux disease)   . Hyperlipidemia   . Hypertension   . Osteoarthritis, multiple sites   . Osteoarthrosis involving, or with mention of more than one site, but not specified as generalized, multiple sites   . Osteopenia   . Personal history of radiation therapy 2001   left breast ca  . Psoriasis     Past Surgical History:  Procedure Laterality Date  . ABDOMINAL HYSTERECTOMY    . ANKLE FRACTURE SURGERY  4/08   left---hardware still in place  . 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 BREAST EXCISIONAL BIOPSY Left yrs ago   benign  . BREAST LUMPECTOMY  2001   left  . CAROTID ENDARTERECTOMY Left 10/23/2004  . FRACTURE SURGERY    . SHOULDER SURGERY  6/07   left  . TONSILLECTOMY AND ADENOIDECTOMY    . TOTAL HIP ARTHROPLASTY  2004   right    There were no vitals filed for this visit.      Subjective Assessment - 02/23/17 1005    Subjective Patient reports increased Right knee pain after falling on her knee while getting out of the car yesterday. Patient reports she 'twisted' her knee and is continueing to  have pain.    Pertinent History Pt reports that she has noticed worsening of her balance recently. She has had approximately 4 falls in the last 6 months. No known pattern to her falls. Pt reports that she gets in a rush and then she falls. Denies dizziness. Denies motion sensitivity. She has hardware in her L ankle from a fracture of both her tibia and fibula secondary to a fall. She notices worse balance in the AM. 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 Yes   Pain Score 6    Pain Location Knee   Pain Orientation Right   Pain Descriptors / Indicators Sharp   Pain Type Acute pain   Pain Onset Yesterday        TREATMENT: Nustep - 31min, level (3) 5 min at seat level 9 with cueing on speed of performance without UE support Bridges in hooklying - 2 x 20 Knees to chest with physioball - x 55min LTRs on physioballs - x 2 min  SLR's in supine - x 10 B Seated knee AROM with weighted ball - x20 B  Resisted hip abduction in hooklying position -  30sec holds x 3 Seated Hip adduction with small pink ball - x20 3 sec holds    Patient responds with no increase to knee pain throughout entirety of session.        PT Education - 02/23/17 1010    Education provided Yes   Education Details form/technique with exercise   Person(s) Educated Patient   Methods Explanation;Demonstration   Comprehension Verbalized understanding;Returned demonstration             PT Long Term Goals - 02/15/17 1049      PT LONG TERM GOAL #1   Title Pt will be independent with HEP in order to improve strength and balance in order to decrease fall risk and improve function at home and work.    Time 8   Period Weeks   Status Achieved     PT LONG TERM GOAL #2   Title Pt will improve BERG by at least 3 points in order to demonstrate clinically significant  improvement in balance.     Baseline 10/19/16: 44/56, 11/26/16 -- 47/56; 01/14/17: 49/56   Time 8   Period Weeks   Status Achieved     PT LONG TERM GOAL #3   Title Pt will be able to perform sit to stand from regular height chair x 10 without UE support to improve functional independence when raising up from low surfaces such as commode   Baseline 10/19/16: unable to perform sit to stand without UE support -- 5/10 still unable to complete 1 without use of UEs. 01/14/2017: able to perform x17 with airex on chair 02/15/2017: Able to perform sit to stand -- x12 with pillow on standard chair    Time 8   Period Weeks   Status On-going     PT LONG TERM GOAL #4   Title Pt will improve ABC by at least 13% in order to demonstrate clinically significant improvement in balance confidence.    Baseline 10/19/16: 46.88% 01/14/2017: 65%   Time 8   Period Weeks   Status Achieved     PT LONG TERM GOAL #5   Title Patient will improve SLS balance to over 10secs B to improve balance while high cabinets and decreasing fall risk.    Baseline 2 sec B   Time 6   Period Weeks   Status New               Plan - 02/23/17 1027    Clinical Impression Statement Performed exercises in non-weight bearing position as patient injured her knee yesterday. Patient demonstrates continuous pain in the knee at end of the session and educated her to monitor symptoms and use ice to decrease swelling. Patient demonstrates increased fatigue with exercises focusing on improving glute activation indicating increased weakness and poor coordination. Patient will benefit from further skilled therapy to return to prior level of function.    Rehab Potential Good   Clinical Impairments Affecting Rehab Potential Positive: motivation; Negative: possible decreased foot sensation, age   PT Frequency 2x / week   PT Duration 8 weeks   PT Treatment/Interventions ADLs/Self Care Home Management;Aquatic Therapy;Biofeedback;DME Instruction;Gait  training;Stair training;Functional mobility training;Therapeutic activities;Therapeutic exercise;Balance training;Neuromuscular re-education;Patient/family education;Passive range of motion;Energy conservation   PT Next Visit Plan Progress balance and strengthening exercises,    PT Home Exercise Plan modified tandem balance, sit to stand from elevated chair without UE support, standing yellow tband hip abduction, hooklying bridges   Consulted and Agree with Plan of Care Patient  Patient will benefit from skilled therapeutic intervention in order to improve the following deficits and impairments:  Abnormal gait, Pain, Decreased balance  Visit Diagnosis: Unsteadiness on feet  Muscle weakness (generalized)  History of falling     Problem List Patient Active Problem List   Diagnosis Date Noted  . Diarrhea 05/18/2016  . Fall 05/01/2016  . Unintentional weight loss 12/30/2015  . Leg weakness, bilateral 12/30/2015  . Hyperkalemia 01/01/2015  . Occasional numbness/prickling/tingling of fingers and toes 12/18/2014  . Routine general medical examination at a health care facility 12/13/2013  . Macrocytic anemia 05/19/2013  . 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 Psoriasis   . Osteoarthritis, multiple sites   . Episodic mood disorder ([place]) 12/01/2011  . History of breast cancer 10/28/2011  . GERD (gastroesophageal reflux disease)   . Hyperlipidemia   . Hypertension   . Osteopenia   . Occlusion and stenosis of carotid artery without mention of cerebral infarction 05/27/2011    Blythe Stanford, PT DPT 02/23/2017, 10:33 AM  [facility] [address] [place], [address] Phone: [phone]   Fax:  [phone]  Name: [name] MRN: [id number] Date of Birth: 1941/03/30

## 2017-02-25 ENCOUNTER — Ambulatory Visit: Payer: Medicare Other

## 2017-02-25 DIAGNOSIS — M6281 Muscle weakness (generalized): Secondary | ICD-10-CM | POA: Diagnosis not present

## 2017-02-25 DIAGNOSIS — R2681 Unsteadiness on feet: Secondary | ICD-10-CM | POA: Diagnosis not present

## 2017-02-25 DIAGNOSIS — Z9181 History of falling: Secondary | ICD-10-CM | POA: Diagnosis not present

## 2017-02-25 NOTE — Therapy (Signed)
[facility] [address] [place], [address] Phone: [phone]   Fax:  [phone]  Physical Therapy Treatment  Patient Details  Name: [name] MRN: [id number] Date of Birth: Nov 22, 1940 Referring Provider: Dr. Caryl Bis  Encounter Date: 02/25/2017      PT End of Session - 02/25/17 1406    Visit Number 28   Number of Visits 42   Date for PT Re-Evaluation 04/07/2017   Authorization Type g codes 10/20/22   PT Start Time 1330   PT Stop Time 1415   PT Time Calculation (min) 45 min   Equipment Utilized During Treatment Gait belt   Activity Tolerance Patient tolerated treatment well   Behavior During Therapy [facility] for tasks assessed/performed      Past Medical History:  Diagnosis Date  . Breast cancer ([place]) 2001   left breast  . Cancer ([place]) 2001   left breast ca  . GERD (gastroesophageal reflux disease)   . Hyperlipidemia   . Hypertension   . Osteoarthritis, multiple sites   . Osteoarthrosis involving, or with mention of more than one site, but not specified as generalized, multiple sites   . Osteopenia   . Personal history of radiation therapy 2001   left breast ca  . Psoriasis     Past Surgical History:  Procedure Laterality Date  . ABDOMINAL HYSTERECTOMY    . ANKLE FRACTURE SURGERY  4/08   left---hardware still in place  . 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 BREAST EXCISIONAL BIOPSY Left yrs ago   benign  . BREAST LUMPECTOMY  2001   left  . CAROTID ENDARTERECTOMY Left 10/23/2004  . FRACTURE SURGERY    . SHOULDER SURGERY  6/07   left  . TONSILLECTOMY AND ADENOIDECTOMY    . TOTAL HIP ARTHROPLASTY  2004   right    There were no vitals filed for this visit.      Subjective Assessment - 02/25/17 1403    Subjective Patient reports the pain in the knee is feeling much better since the previous visit. Patient reports she feels good today.    Pertinent History Pt reports that she  has noticed worsening of her balance recently. She has had approximately 4 falls in the last 6 months. No known pattern to her falls. Pt reports that she gets in a rush and then she falls. Denies dizziness. Denies motion sensitivity. She has hardware in her L ankle from a fracture of both her tibia and fibula secondary to a fall. She notices worse balance in the AM. 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 No/denies   Pain Onset Yesterday         TREATMENT: Nustep - 46min, level (3) 2 min, level (4) 3 min; at seat level 9 with cueing on speed of performance without UE support Tandem Stance on airex beam - UE support x10 with UE support Side stepping across airex beam with airex pad underneath - x20 B Leg Press at [facility] - 55# 3 x 15 Heel raises off of airex beam - x 20 with UE support  Sit to stands with pillow on chair -  x 10 , x 8 Hip machine Hip abduction - 2 x 10 B 40#   Patient demonstrates increased fatigue at the end  of session.         PT Education - 02/25/17 1406    Education provided Yes   Education Details form/technique with exercise   Person(s) Educated Patient   Methods Explanation;Demonstration   Comprehension Returned demonstration;Verbalized understanding             PT Long Term Goals - 02/15/17 1049      PT LONG TERM GOAL #1   Title Pt will be independent with HEP in order to improve strength and balance in order to decrease fall risk and improve function at home and work.    Time 8   Period Weeks   Status Achieved     PT LONG TERM GOAL #2   Title Pt will improve BERG by at least 3 points in order to demonstrate clinically significant improvement in balance.     Baseline 10/19/16: 44/56, 11/26/16 -- 47/56; 01/14/17: 49/56   Time 8   Period Weeks   Status Achieved     PT LONG TERM GOAL #3   Title  Pt will be able to perform sit to stand from regular height chair x 10 without UE support to improve functional independence when raising up from low surfaces such as commode   Baseline 10/19/16: unable to perform sit to stand without UE support -- 5/10 still unable to complete 1 without use of UEs. 01/14/2017: able to perform x17 with airex on chair 02/15/2017: Able to perform sit to stand -- x12 with pillow on standard chair    Time 8   Period Weeks   Status On-going     PT LONG TERM GOAL #4   Title Pt will improve ABC by at least 13% in order to demonstrate clinically significant improvement in balance confidence.    Baseline 10/19/16: 46.88% 01/14/2017: 65%   Time 8   Period Weeks   Status Achieved     PT LONG TERM GOAL #5   Title Patient will improve SLS balance to over 10secs B to improve balance while high cabinets and decreasing fall risk.    Baseline 2 sec B   Time 6   Period Weeks   Status New               Plan - 02/25/17 1407    Clinical Impression Statement Focused on improving strength again today secondary to patient demonstrating improvement with knee pain. Patient demonstrates ability to perform greater amount of sit to stands versus previous visit sessions indicating functional carryover between visits. Patient will benefit from further skilled therapy to return to prior level of function.    Rehab Potential Good   Clinical Impairments Affecting Rehab Potential Positive: motivation; Negative: possible decreased foot sensation, age   PT Frequency 2x / week   PT Duration 8 weeks   PT Treatment/Interventions ADLs/Self Care Home Management;Aquatic Therapy;Biofeedback;DME Instruction;Gait training;Stair training;Functional mobility training;Therapeutic activities;Therapeutic exercise;Balance training;Neuromuscular re-education;Patient/family education;Passive range of motion;Energy conservation   PT Next Visit Plan Progress balance and strengthening exercises,    PT Home  Exercise Plan modified tandem balance, sit to stand from elevated chair without UE support, standing yellow tband hip abduction, hooklying bridges   Consulted and Agree with Plan of Care Patient      Patient will benefit from skilled therapeutic intervention in order to improve the following deficits and impairments:  Abnormal gait, Pain, Decreased balance  Visit Diagnosis: Unsteadiness on feet  Muscle weakness (generalized)  History of falling     Problem List Patient Active Problem List  Diagnosis Date Noted  . Diarrhea 05/18/2016  . Fall 05/01/2016  . Unintentional weight loss 12/30/2015  . Leg weakness, bilateral 12/30/2015  . Hyperkalemia 01/01/2015  . Occasional numbness/prickling/tingling of fingers and toes 12/18/2014  . Routine general medical examination at a health care facility 12/13/2013  . Macrocytic anemia 05/19/2013  . 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 Psoriasis   . Osteoarthritis, multiple sites   . Episodic mood disorder ([place]) 12/01/2011  . History of breast cancer 10/28/2011  . GERD (gastroesophageal reflux disease)   . Hyperlipidemia   . Hypertension   . Osteopenia   . Occlusion and stenosis of carotid artery without mention of cerebral infarction 05/27/2011    Blythe Stanford, PT DPT 02/25/2017, 2:15 PM  [facility] [address], [place], [address] Phone: [phone]   Fax:  [phone]  Name: [name] MRN: [id number] Date of Birth: 1940-10-26

## 2017-02-26 ENCOUNTER — Other Ambulatory Visit: Payer: Self-pay | Admitting: Family Medicine

## 2017-03-01 ENCOUNTER — Ambulatory Visit: Payer: Medicare Other

## 2017-03-01 DIAGNOSIS — M6281 Muscle weakness (generalized): Secondary | ICD-10-CM

## 2017-03-01 DIAGNOSIS — Z9181 History of falling: Secondary | ICD-10-CM | POA: Diagnosis not present

## 2017-03-01 DIAGNOSIS — R2681 Unsteadiness on feet: Secondary | ICD-10-CM

## 2017-03-01 NOTE — Therapy (Signed)
[facility] [address], [place], [address] Phone: [phone]   Fax:  [phone]  Physical Therapy Treatment  Patient Details  Name: [name] MRN: [id number] Date of Birth: Sep 16, 1940 Referring Provider: Dr. Caryl Bis  Encounter Date: 03/01/2017      PT End of Session - 03/01/17 1305    Visit Number 30   Number of Visits 42   Date for PT Re-Evaluation 03-31-17   Authorization Type g codes 5/10   PT Start Time 1300   PT Stop Time 1345   PT Time Calculation (min) 45 min   Equipment Utilized During Treatment Gait belt   Activity Tolerance Patient tolerated treatment well   Behavior During Therapy [facility] for tasks assessed/performed      Past Medical History:  Diagnosis Date  . Breast cancer ([place]) 2001   left breast  . Cancer ([place]) 2001   left breast ca  . GERD (gastroesophageal reflux disease)   . Hyperlipidemia   . Hypertension   . Osteoarthritis, multiple sites   . Osteoarthrosis involving, or with mention of more than one site, but not specified as generalized, multiple sites   . Osteopenia   . Personal history of radiation therapy 2001   left breast ca  . Psoriasis     Past Surgical History:  Procedure Laterality Date  . ABDOMINAL HYSTERECTOMY    . ANKLE FRACTURE SURGERY  4/08   left---hardware still in place  . 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 BREAST EXCISIONAL BIOPSY Left yrs ago   benign  . BREAST LUMPECTOMY  2001   left  . CAROTID ENDARTERECTOMY Left 10/23/2004  . FRACTURE SURGERY    . SHOULDER SURGERY  6/07   left  . TONSILLECTOMY AND ADENOIDECTOMY    . TOTAL HIP ARTHROPLASTY  2004   right    There were no vitals filed for this visit.      Subjective Assessment - 03/01/17 1302    Subjective Pt reports that she is doing well on this date. She believes that she is improving with respect to her strength and balance. No specific questions or concerns at this  time.    Pertinent History Pt reports that she has noticed worsening of her balance recently. She has had approximately 4 falls in the last 6 months. No known pattern to her falls. Pt reports that she gets in a rush and then she falls. Denies dizziness. Denies motion sensitivity. She has hardware in her L ankle from a fracture of both her tibia and fibula secondary to a fall. She notices worse balance in the AM. 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 No/denies         TREATMENT:   Ther-ex Nustep - 93min, level (3) 2 min, level (4) 3 min; at seat level 9 with cueing on speed of performance without UE support Sit to stands with Airex on chair x 10, no Airex x 5 with intermittent minA+1 assist from therapist with reps 3-5; Total Gym (TG) squats L26 x 10; TG single leg squats L12 x 10, L14 x 10 bilateral with contralateral support provided by therapist;   Matrix hip abduction 40# 2 x 10 bilateral;  Neuromuscular Re-education Tandem Stance on airex beam - UE support x10 with UE support;  Side stepping across airex beam x multiple laps; Tandem ambulation on Airex beam x multiple laps; Airex balance with toe tapping on stepping stones in 1 and 2 stone sequencing as called out by therapist;  Patient demonstrates increased fatigue at the end of session. Requires cues throughout session for proper exercise form a technique especially to avoid hip adduction during sit to stand and TG squats.                            PT Education - 03/01/17 1303    Education provided Yes   Education Details form/technique with exercise   Person(s) Educated Patient   Methods Explanation   Comprehension Verbalized understanding             PT Long Term Goals - 02/15/17 1049      PT LONG TERM GOAL #1   Title Pt will be  independent with HEP in order to improve strength and balance in order to decrease fall risk and improve function at home and work.    Time 8   Period Weeks   Status Achieved     PT LONG TERM GOAL #2   Title Pt will improve BERG by at least 3 points in order to demonstrate clinically significant improvement in balance.     Baseline 10/19/16: 44/56, 11/26/16 -- 47/56; 01/14/17: 49/56   Time 8   Period Weeks   Status Achieved     PT LONG TERM GOAL #3   Title Pt will be able to perform sit to stand from regular height chair x 10 without UE support to improve functional independence when raising up from low surfaces such as commode   Baseline 10/19/16: unable to perform sit to stand without UE support -- 5/10 still unable to complete 1 without use of UEs. 01/14/2017: able to perform x17 with airex on chair 02/15/2017: Able to perform sit to stand -- x12 with pillow on standard chair    Time 8   Period Weeks   Status On-going     PT LONG TERM GOAL #4   Title Pt will improve ABC by at least 13% in order to demonstrate clinically significant improvement in balance confidence.    Baseline 10/19/16: 46.88% 01/14/2017: 65%   Time 8   Period Weeks   Status Achieved     PT LONG TERM GOAL #5   Title Patient will improve SLS balance to over 10secs B to improve balance while high cabinets and decreasing fall risk.    Baseline 2 sec B   Time 6   Period Weeks   Status New               Plan - 03/01/17 1305    Clinical Impression Statement Pt continues to demonstrate improvement with sit to stand testing. Requires cues for proper technique and reports fear of falling forward with adequate weight shifting. Pt encouraged to continue HEP and follow-up as scheduled.    Clinical Presentation Stable   Clinical Decision Making Low   Rehab Potential Good   Clinical Impairments Affecting Rehab Potential Positive: motivation; Negative: possible decreased foot sensation, age   PT Frequency 2x / week   PT  Duration 8 weeks   PT Treatment/Interventions ADLs/Self Care Home Management;Aquatic Therapy;Biofeedback;DME Instruction;Gait training;Stair training;Functional mobility training;Therapeutic activities;Therapeutic exercise;Balance training;Neuromuscular re-education;Patient/family education;Passive range of motion;Energy conservation   PT Next Visit Plan Progress balance and strengthening exercises,    PT Home Exercise Plan  modified tandem balance, sit to stand from elevated chair without UE support, standing yellow tband hip abduction, hooklying bridges   Consulted and Agree with Plan of Care Patient      Patient will benefit from skilled therapeutic intervention in order to improve the following deficits and impairments:  Abnormal gait, Pain, Decreased balance  Visit Diagnosis: Unsteadiness on feet  Muscle weakness (generalized)     Problem List Patient Active Problem List   Diagnosis Date Noted  . Diarrhea 05/18/2016  . Fall 05/01/2016  . Unintentional weight loss 12/30/2015  . Leg weakness, bilateral 12/30/2015  . Hyperkalemia 01/01/2015  . Occasional numbness/prickling/tingling of fingers and toes 12/18/2014  . Routine general medical examination at a health care facility 12/13/2013  . Macrocytic anemia 05/19/2013  . 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 Psoriasis   . Osteoarthritis, multiple sites   . Episodic mood disorder ([place]) 12/01/2011  . History of breast cancer 10/28/2011  . GERD (gastroesophageal reflux disease)   . Hyperlipidemia   . Hypertension   . Osteopenia   . Occlusion and stenosis of carotid artery without mention of cerebral infarction 05/27/2011   Phillips Grout PT, DPT   Anureet Bruington 03/01/2017, 3:52 PM  [facility] [address], [place], [address] Phone: [phone]   Fax:  [phone]  Name: [name] MRN: [id number] Date of Birth: 1941-02-25

## 2017-03-04 ENCOUNTER — Ambulatory Visit: Payer: Medicare Other

## 2017-03-04 DIAGNOSIS — R2681 Unsteadiness on feet: Secondary | ICD-10-CM

## 2017-03-04 DIAGNOSIS — M6281 Muscle weakness (generalized): Secondary | ICD-10-CM | POA: Diagnosis not present

## 2017-03-04 DIAGNOSIS — Z9181 History of falling: Secondary | ICD-10-CM

## 2017-03-04 NOTE — Therapy (Signed)
[facility] [address] [place], [address] Phone: [phone]   Fax:  [phone]  Physical Therapy Treatment  Patient Details  Name: [name] MRN: [id number] Date of Birth: 16-Jun-1941 Referring Provider: Dr. Caryl Bis  Encounter Date: 03/04/2017      PT End of Session - 03/04/17 1354    Visit Number 31   Number of Visits 42   Date for PT Re-Evaluation 04/12/17   Authorization Type g codes 6/10   PT Start Time 1346   PT Stop Time 1430   PT Time Calculation (min) 44 min   Equipment Utilized During Treatment Gait belt   Activity Tolerance Patient tolerated treatment well   Behavior During Therapy [facility] for tasks assessed/performed      Past Medical History:  Diagnosis Date  . Breast cancer ([place]) 2001   left breast  . Cancer ([place]) 2001   left breast ca  . GERD (gastroesophageal reflux disease)   . Hyperlipidemia   . Hypertension   . Osteoarthritis, multiple sites   . Osteoarthrosis involving, or with mention of more than one site, but not specified as generalized, multiple sites   . Osteopenia   . Personal history of radiation therapy 2001   left breast ca  . Psoriasis     Past Surgical History:  Procedure Laterality Date  . ABDOMINAL HYSTERECTOMY    . ANKLE FRACTURE SURGERY  4/08   left---hardware still in place  . 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 BREAST EXCISIONAL BIOPSY Left yrs ago   benign  . BREAST LUMPECTOMY  2001   left  . CAROTID ENDARTERECTOMY Left 10/23/2004  . FRACTURE SURGERY    . SHOULDER SURGERY  6/07   left  . TONSILLECTOMY AND ADENOIDECTOMY    . TOTAL HIP ARTHROPLASTY  2004   right    There were no vitals filed for this visit.      Subjective Assessment - 03/04/17 1351    Subjective Pt reports no major changes since the previous visit and states she is gettting stronger.    Pertinent History Pt reports that she has noticed worsening  of her balance recently. She has had approximately 4 falls in the last 6 months. No known pattern to her falls. Pt reports that she gets in a rush and then she falls. Denies dizziness. Denies motion sensitivity. She has hardware in her L ankle from a fracture of both her tibia and fibula secondary to a fall. She notices worse balance in the AM. 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 No/denies      TREATMENT: Nustep - 63min, level (3) 1 min, level (4) 4 min; at seat level 9 with cueing on speed of performance without UE support Seated sit to stand with pillow on a chair - 2x10 Standing hip abduction with RTB around ankles - 2 x 15 Leg Press at [facility] - 55# 3 x 20 Single leg stance with intermittent UE support - 2 x 30sec Side stepping over airex beam with pad underneath - x 10 B Heel raises off of airex beam - x 25 Pre gait weight shifts - x 20     Patient demonstrates increased LE fatigue at the end of session.  PT Education - 03/04/17 1353    Education provided Yes   Education Details form/technique with exercise   Person(s) Educated Patient   Methods Explanation;Demonstration   Comprehension Verbalized understanding;Returned demonstration             PT Long Term Goals - 02/15/17 1049      PT LONG TERM GOAL #1   Title Pt will be independent with HEP in order to improve strength and balance in order to decrease fall risk and improve function at home and work.    Time 8   Period Weeks   Status Achieved     PT LONG TERM GOAL #2   Title Pt will improve BERG by at least 3 points in order to demonstrate clinically significant improvement in balance.     Baseline 10/19/16: 44/56, 11/26/16 -- 47/56; 01/14/17: 49/56   Time 8   Period Weeks   Status Achieved     PT LONG TERM GOAL #3   Title Pt will be able to perform  sit to stand from regular height chair x 10 without UE support to improve functional independence when raising up from low surfaces such as commode   Baseline 10/19/16: unable to perform sit to stand without UE support -- 5/10 still unable to complete 1 without use of UEs. 01/14/2017: able to perform x17 with airex on chair 02/15/2017: Able to perform sit to stand -- x12 with pillow on standard chair    Time 8   Period Weeks   Status On-going     PT LONG TERM GOAL #4   Title Pt will improve ABC by at least 13% in order to demonstrate clinically significant improvement in balance confidence.    Baseline 10/19/16: 46.88% 01/14/2017: 65%   Time 8   Period Weeks   Status Achieved     PT LONG TERM GOAL #5   Title Patient will improve SLS balance to over 10secs B to improve balance while high cabinets and decreasing fall risk.    Baseline 2 sec B   Time 6   Period Weeks   Status New               Plan - 03/04/17 1417    Clinical Impression Statement Patient continues to demonstrates decreased LE strength with exercises as indicated by requiring frequent rest breaks and increased fatigue with therapy. Patient demonstrates improvement with sit to stand performance with ability to perform more repetitions before requiring a sitting rest break. Patient will benefit from further skilled therapy focused on improving current limitations to return to prior level of function.    Rehab Potential Good   Clinical Impairments Affecting Rehab Potential Positive: motivation; Negative: possible decreased foot sensation, age   PT Frequency 2x / week   PT Duration 8 weeks   PT Treatment/Interventions ADLs/Self Care Home Management;Aquatic Therapy;Biofeedback;DME Instruction;Gait training;Stair training;Functional mobility training;Therapeutic activities;Therapeutic exercise;Balance training;Neuromuscular re-education;Patient/family education;Passive range of motion;Energy conservation   PT Next Visit Plan  Progress balance and strengthening exercises,    PT Home Exercise Plan modified tandem balance, sit to stand from elevated chair without UE support, standing yellow tband hip abduction, hooklying bridges   Consulted and Agree with Plan of Care Patient      Patient will benefit from skilled therapeutic intervention in order to improve the following deficits and impairments:  Abnormal gait, Pain, Decreased balance  Visit Diagnosis: Unsteadiness on feet  Muscle weakness (generalized)  History of falling     Problem List Patient Active  Problem List   Diagnosis Date Noted  . Diarrhea 05/18/2016  . Fall 05/01/2016  . Unintentional weight loss 12/30/2015  . Leg weakness, bilateral 12/30/2015  . Hyperkalemia 01/01/2015  . Occasional numbness/prickling/tingling of fingers and toes 12/18/2014  . Routine general medical examination at a health care facility 12/13/2013  . Macrocytic anemia 05/19/2013  . 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 Psoriasis   . Osteoarthritis, multiple sites   . Episodic mood disorder ([place]) 12/01/2011  . History of breast cancer 10/28/2011  . GERD (gastroesophageal reflux disease)   . Hyperlipidemia   . Hypertension   . Osteopenia   . Occlusion and stenosis of carotid artery without mention of cerebral infarction 05/27/2011    Blythe Stanford, PT DPT 03/04/2017, 2:30 PM  [facility] [address], [place], [address] Phone: [phone]   Fax:  [phone]  Name: [name] MRN: [id number] Date of Birth: 1941/03/30

## 2017-03-08 ENCOUNTER — Ambulatory Visit: Payer: Medicare Other

## 2017-03-08 DIAGNOSIS — M6281 Muscle weakness (generalized): Secondary | ICD-10-CM

## 2017-03-08 DIAGNOSIS — R2681 Unsteadiness on feet: Secondary | ICD-10-CM

## 2017-03-08 DIAGNOSIS — Z9181 History of falling: Secondary | ICD-10-CM | POA: Diagnosis not present

## 2017-03-08 NOTE — Therapy (Signed)
[place] [facility] [address], [place], [address] Phone: [phone]   Fax:  [phone]  Physical Therapy Treatment  Patient Details  Name: [name] MRN: [id number] Date of Birth: 1940/11/05 Referring Provider: Dr. Caryl Bis  Encounter Date: 03/08/2017      PT End of Session - 03/08/17 1309    Visit Number 32   Number of Visits 42   Date for PT Re-Evaluation 03-17-17   Authorization Type g codes 7/10   PT Start Time 1300   PT Stop Time 1345   PT Time Calculation (min) 45 min   Equipment Utilized During Treatment Gait belt   Activity Tolerance Patient tolerated treatment well   Behavior During Therapy [facility] for tasks assessed/performed      Past Medical History:  Diagnosis Date  . Breast cancer ([place]) 2001   left breast  . Cancer ([place]) 2001   left breast ca  . GERD (gastroesophageal reflux disease)   . Hyperlipidemia   . Hypertension   . Osteoarthritis, multiple sites   . Osteoarthrosis involving, or with mention of more than one site, but not specified as generalized, multiple sites   . Osteopenia   . Personal history of radiation therapy 2001   left breast ca  . Psoriasis     Past Surgical History:  Procedure Laterality Date  . ABDOMINAL HYSTERECTOMY    . ANKLE FRACTURE SURGERY  4/08   left---hardware still in place  . 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 BREAST EXCISIONAL BIOPSY Left yrs ago   benign  . BREAST LUMPECTOMY  2001   left  . CAROTID ENDARTERECTOMY Left 10/23/2004  . FRACTURE SURGERY    . SHOULDER SURGERY  6/07   left  . TONSILLECTOMY AND ADENOIDECTOMY    . TOTAL HIP ARTHROPLASTY  2004   right    There were no vitals filed for this visit.      Subjective Assessment - 03/08/17 1308    Subjective Pt reports no major changes since the previous visit. Patient reports her legs continue to feel stronger.   Pertinent History Pt reports that she has noticed  worsening of her balance recently. She has had approximately 4 falls in the last 6 months. No known pattern to her falls. Pt reports that she gets in a rush and then she falls. Denies dizziness. Denies motion sensitivity. She has hardware in her L ankle from a fracture of both her tibia and fibula secondary to a fall. She notices worse balance in the AM. 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 No/denies        TREATMENT: Nustep - 48min, level (3) 1 min, level (4) 4 min; at seat level 9 with cueing on speed of performance without UE support Hip Machine Hip abduction - 2 x 10 40#  Leg Press at [facility] - 65# 3 x 10 Feet together on half foam with flat side up - 2 min Side stepping & forward & backwardover 4 cones stacked - x10 in each direction Seated sit to stand with pillow on a chair - x15, x5   Patient demonstrates increased LE fatigue at the end of session.        PT Education - 03/08/17 1309    Education provided Yes   Education  Details form/technique for exercise   Person(s) Educated Patient   Methods Explanation;Demonstration   Comprehension Verbalized understanding;Returned demonstration             PT Long Term Goals - 02/15/17 1049      PT LONG TERM GOAL #1   Title Pt will be independent with HEP in order to improve strength and balance in order to decrease fall risk and improve function at home and work.    Time 8   Period Weeks   Status Achieved     PT LONG TERM GOAL #2   Title Pt will improve BERG by at least 3 points in order to demonstrate clinically significant improvement in balance.     Baseline 10/19/16: 44/56, 11/26/16 -- 47/56; 01/14/17: 49/56   Time 8   Period Weeks   Status Achieved     PT LONG TERM GOAL #3   Title Pt will be able to perform sit to stand from regular height chair x 10 without UE  support to improve functional independence when raising up from low surfaces such as commode   Baseline 10/19/16: unable to perform sit to stand without UE support -- 5/10 still unable to complete 1 without use of UEs. 01/14/2017: able to perform x17 with airex on chair 02/15/2017: Able to perform sit to stand -- x12 with pillow on standard chair    Time 8   Period Weeks   Status On-going     PT LONG TERM GOAL #4   Title Pt will improve ABC by at least 13% in order to demonstrate clinically significant improvement in balance confidence.    Baseline 10/19/16: 46.88% 01/14/2017: 65%   Time 8   Period Weeks   Status Achieved     PT LONG TERM GOAL #5   Title Patient will improve SLS balance to over 10secs B to improve balance while high cabinets and decreasing fall risk.    Baseline 2 sec B   Time 6   Period Weeks   Status New               Plan - 03/08/17 1317    Clinical Impression Statement Patient demonstrates improvement in quadricep strength as indicated by improvement with performance of leg press exercise. Continued to focus on balance and stepping over large items and patient demonstrates improvement to balance requiring intermittent UE support throughout session. Patient will benefit from further skilled therapy to return to prior level of function.    Rehab Potential Good   Clinical Impairments Affecting Rehab Potential Positive: motivation; Negative: possible decreased foot sensation, age   PT Frequency 2x / week   PT Duration 8 weeks   PT Treatment/Interventions ADLs/Self Care Home Management;Aquatic Therapy;Biofeedback;DME Instruction;Gait training;Stair training;Functional mobility training;Therapeutic activities;Therapeutic exercise;Balance training;Neuromuscular re-education;Patient/family education;Passive range of motion;Energy conservation   PT Next Visit Plan Progress balance and strengthening exercises,    PT Home Exercise Plan modified tandem balance, sit to stand  from elevated chair without UE support, standing yellow tband hip abduction, hooklying bridges   Consulted and Agree with Plan of Care Patient      Patient will benefit from skilled therapeutic intervention in order to improve the following deficits and impairments:  Abnormal gait, Pain, Decreased balance  Visit Diagnosis: Unsteadiness on feet  Muscle weakness (generalized)  History of falling     Problem List Patient Active Problem List   Diagnosis Date Noted  . Diarrhea 05/18/2016  . Fall 05/01/2016  . Unintentional weight loss 12/30/2015  .  Leg weakness, bilateral 12/30/2015  . Hyperkalemia 01/01/2015  . Occasional numbness/prickling/tingling of fingers and toes 12/18/2014  . Routine general medical examination at a health care facility 12/13/2013  . Macrocytic anemia 05/19/2013  . 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 Psoriasis   . Osteoarthritis, multiple sites   . Episodic mood disorder ([place]) 12/01/2011  . History of breast cancer 10/28/2011  . GERD (gastroesophageal reflux disease)   . Hyperlipidemia   . Hypertension   . Osteopenia   . Occlusion and stenosis of carotid artery without mention of cerebral infarction 05/27/2011    Blythe Stanford, PT DPT 03/08/2017, 1:43 PM  [place] [facility] [address], [place], [address] Phone: [phone]   Fax:  [phone]  Name: MARZELLE RUTTEN MRN: [id number] Date of Birth: 1941-04-04

## 2017-03-11 ENCOUNTER — Ambulatory Visit: Payer: Medicare Other

## 2017-03-11 DIAGNOSIS — M6281 Muscle weakness (generalized): Secondary | ICD-10-CM | POA: Diagnosis not present

## 2017-03-11 DIAGNOSIS — Z9181 History of falling: Secondary | ICD-10-CM

## 2017-03-11 DIAGNOSIS — R2681 Unsteadiness on feet: Secondary | ICD-10-CM

## 2017-03-11 NOTE — Therapy (Signed)
[place] [facility] [address] [place], [address] Phone: [phone]   Fax:  [phone]  Physical Therapy Treatment  Patient Details  Name: [name] MRN: [id number] Date of Birth: Jul 03, 1941 Referring Provider: Dr. Caryl Bis  Encounter Date: 03/11/2017      PT End of Session - 03/11/17 1015    Visit Number 33   Number of Visits 42   Date for PT Re-Evaluation 04-01-2017   Authorization Type g codes 8/10   PT Start Time 0955   PT Stop Time 1033   PT Time Calculation (min) 38 min   Equipment Utilized During Treatment Gait belt   Activity Tolerance Patient tolerated treatment well   Behavior During Therapy [facility] for tasks assessed/performed      Past Medical History:  Diagnosis Date  . Breast cancer ([place]) 2001   left breast  . Cancer ([place]) 2001   left breast ca  . GERD (gastroesophageal reflux disease)   . Hyperlipidemia   . Hypertension   . Osteoarthritis, multiple sites   . Osteoarthrosis involving, or with mention of more than one site, but not specified as generalized, multiple sites   . Osteopenia   . Personal history of radiation therapy 2001   left breast ca  . Psoriasis     Past Surgical History:  Procedure Laterality Date  . ABDOMINAL HYSTERECTOMY    . ANKLE FRACTURE SURGERY  4/08   left---hardware still in place  . 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 BREAST EXCISIONAL BIOPSY Left yrs ago   benign  . BREAST LUMPECTOMY  2001   left  . CAROTID ENDARTERECTOMY Left 10/23/2004  . FRACTURE SURGERY    . SHOULDER SURGERY  6/07   left  . TONSILLECTOMY AND ADENOIDECTOMY    . TOTAL HIP ARTHROPLASTY  2004   right    There were no vitals filed for this visit.      Subjective Assessment - 03/11/17 0958    Subjective Pt reports no major changes. Reports she was 10min late for her appointment today secondary to her husband having difficulty with arrising from sleeping.    Pertinent  History Pt reports that she has noticed worsening of her balance recently. She has had approximately 4 falls in the last 6 months. No known pattern to her falls. Pt reports that she gets in a rush and then she falls. Denies dizziness. Denies motion sensitivity. She has hardware in her L ankle from a fracture of both her tibia and fibula secondary to a fall. She notices worse balance in the AM. 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 No/denies         TREATMENT: Nustep - 55min, level (4) 5 min; at seat level 9 with cueing on speed of performance without UE support Feet together on half foam with flat side up - 2 min Hip abduction stepping on airex beam - x 10 down and back Tandem ambulation with balance stones - x 5 down and back (4 stones) Seated sit to stand with pillow on a chair - x15, x10 Side stepping up and over airex - x 15 with intermittent UE support    Patient demonstrates increased LE fatigue at the end of session.        PT Education -  03/11/17 1014    Education provided Yes   Education Details form/technique for exercise   Person(s) Educated Patient   Methods Explanation;Demonstration   Comprehension Verbalized understanding;Returned demonstration             PT Long Term Goals - 02/15/17 1049      PT LONG TERM GOAL #1   Title Pt will be independent with HEP in order to improve strength and balance in order to decrease fall risk and improve function at home and work.    Time 8   Period Weeks   Status Achieved     PT LONG TERM GOAL #2   Title Pt will improve BERG by at least 3 points in order to demonstrate clinically significant improvement in balance.     Baseline 10/19/16: 44/56, 11/26/16 -- 47/56; 01/14/17: 49/56   Time 8   Period Weeks   Status Achieved     PT LONG TERM GOAL #3   Title Pt will be  able to perform sit to stand from regular height chair x 10 without UE support to improve functional independence when raising up from low surfaces such as commode   Baseline 10/19/16: unable to perform sit to stand without UE support -- 5/10 still unable to complete 1 without use of UEs. 01/14/2017: able to perform x17 with airex on chair 02/15/2017: Able to perform sit to stand -- x12 with pillow on standard chair    Time 8   Period Weeks   Status On-going     PT LONG TERM GOAL #4   Title Pt will improve ABC by at least 13% in order to demonstrate clinically significant improvement in balance confidence.    Baseline 10/19/16: 46.88% 01/14/2017: 65%   Time 8   Period Weeks   Status Achieved     PT LONG TERM GOAL #5   Title Patient will improve SLS balance to over 10secs B to improve balance while high cabinets and decreasing fall risk.    Baseline 2 sec B   Time 6   Period Weeks   Status New               Plan - 03/11/17 1021    Clinical Impression Statement Patient continues to demonstrate improvement in strength and balance with ability to perform greater amount of sit to stands compared to previous visits indicating functional improvement between visitation sessions. Although patient is improving, she continues to demonstrate decreased balance with exercises. Patient will benefit from further skilled therapy to return to prior level of function.    Rehab Potential Good   Clinical Impairments Affecting Rehab Potential Positive: motivation; Negative: possible decreased foot sensation, age   PT Frequency 2x / week   PT Duration 8 weeks   PT Treatment/Interventions ADLs/Self Care Home Management;Aquatic Therapy;Biofeedback;DME Instruction;Gait training;Stair training;Functional mobility training;Therapeutic activities;Therapeutic exercise;Balance training;Neuromuscular re-education;Patient/family education;Passive range of motion;Energy conservation   PT Next Visit Plan Progress balance  and strengthening exercises,    PT Home Exercise Plan modified tandem balance, sit to stand from elevated chair without UE support, standing yellow tband hip abduction, hooklying bridges   Consulted and Agree with Plan of Care Patient      Patient will benefit from skilled therapeutic intervention in order to improve the following deficits and impairments:  Abnormal gait, Pain, Decreased balance  Visit Diagnosis: Unsteadiness on feet  Muscle weakness (generalized)  History of falling     Problem List Patient Active Problem List   Diagnosis Date Noted  .  Diarrhea 05/18/2016  . Fall 05/01/2016  . Unintentional weight loss 12/30/2015  . Leg weakness, bilateral 12/30/2015  . Hyperkalemia 01/01/2015  . Occasional numbness/prickling/tingling of fingers and toes 12/18/2014  . Routine general medical examination at a health care facility 12/13/2013  . Macrocytic anemia 05/19/2013  . 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 Psoriasis   . Osteoarthritis, multiple sites   . Episodic mood disorder ([place]) 12/01/2011  . History of breast cancer 10/28/2011  . GERD (gastroesophageal reflux disease)   . Hyperlipidemia   . Hypertension   . Osteopenia   . Occlusion and stenosis of carotid artery without mention of cerebral infarction 05/27/2011    Blythe Stanford, PT DPT 03/11/2017, 10:29 AM  [place] [address] [place], [address] Phone: [phone]   Fax:  [phone]  Name: [name] MRN: [id number] Date of Birth: 05-08-1941

## 2017-03-15 ENCOUNTER — Ambulatory Visit: Payer: Medicare Other

## 2017-03-15 DIAGNOSIS — R2681 Unsteadiness on feet: Secondary | ICD-10-CM

## 2017-03-15 DIAGNOSIS — Z9181 History of falling: Secondary | ICD-10-CM | POA: Diagnosis not present

## 2017-03-15 DIAGNOSIS — M6281 Muscle weakness (generalized): Secondary | ICD-10-CM

## 2017-03-15 NOTE — Therapy (Signed)
[facility] [address] [place], [address] Phone: [phone]   Fax:  [phone]  Physical Therapy Treatment  Patient Details  Name: [name] MRN: [id number] Date of Birth: 1941-05-01 Referring Provider: Dr. Caryl Bis  Encounter Date: 04-01-2017      PT End of Session - 04/01/17 1344    Visit Number 34   Number of Visits 42   Date for PT Re-Evaluation April 01, 2017   Authorization Type g codes Apr 16, 2023   PT Start Time 1300   PT Stop Time 1345   PT Time Calculation (min) 45 min   Equipment Utilized During Treatment Gait belt   Activity Tolerance Patient tolerated treatment well   Behavior During Therapy [facility] for tasks assessed/performed      Past Medical History:  Diagnosis Date  . Breast cancer ([place]) 2001   left breast  . Cancer ([place]) 2001   left breast ca  . GERD (gastroesophageal reflux disease)   . Hyperlipidemia   . Hypertension   . Osteoarthritis, multiple sites   . Osteoarthrosis involving, or with mention of more than one site, but not specified as generalized, multiple sites   . Osteopenia   . Personal history of radiation therapy 2001   left breast ca  . Psoriasis     Past Surgical History:  Procedure Laterality Date  . ABDOMINAL HYSTERECTOMY    . ANKLE FRACTURE SURGERY  4/08   left---hardware still in place  . 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 BREAST EXCISIONAL BIOPSY Left yrs ago   benign  . BREAST LUMPECTOMY  2001   left  . CAROTID ENDARTERECTOMY Left 10/23/2004  . FRACTURE SURGERY    . SHOULDER SURGERY  6/07   left  . TONSILLECTOMY AND ADENOIDECTOMY    . TOTAL HIP ARTHROPLASTY  2004   right    There were no vitals filed for this visit.      Subjective Assessment - 04-01-2017 1315    Subjective Patient reports no major changes since the previous visit. Turning around quickly at home to answer the phone is difficult.    Pertinent History Pt reports that she  has noticed worsening of her balance recently. She has had approximately 4 falls in the last 6 months. No known pattern to her falls. Pt reports that she gets in a rush and then she falls. Denies dizziness. Denies motion sensitivity. She has hardware in her L ankle from a fracture of both her tibia and fibula secondary to a fall. She notices worse balance in the AM. 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 No/denies          TREATMENT: Nustep - 7min, level (4) 5 min; at seat level 9 with cueing on speed of performance without UE support Feet together on airex pad  - 2 min, x15  Seated sit to stand with pillow on a chair - x15, x10 Cross tapping cones from airex pad - x 10 Stepping reaching out laterally and posteriorly - 2 x 10 Hip abduction at hip machine - 2 x10 #40 Walking around cones on the ground - x3 around 6 cones  Calf raises airex pad - x 20     Patient demonstrates increased LE fatigue at the end of session.  PT Education - 03/15/17 1343    Education provided Yes   Education Details form/technique with exercise   Person(s) Educated Patient   Methods Demonstration;Explanation   Comprehension Verbalized understanding;Returned demonstration             PT Long Term Goals - 02/15/17 1049      PT LONG TERM GOAL #1   Title Pt will be independent with HEP in order to improve strength and balance in order to decrease fall risk and improve function at home and work.    Time 8   Period Weeks   Status Achieved     PT LONG TERM GOAL #2   Title Pt will improve BERG by at least 3 points in order to demonstrate clinically significant improvement in balance.     Baseline 10/19/16: 44/56, 11/26/16 -- 47/56; 01/14/17: 49/56   Time 8   Period Weeks   Status Achieved     PT LONG TERM GOAL #3   Title Pt will be able to  perform sit to stand from regular height chair x 10 without UE support to improve functional independence when raising up from low surfaces such as commode   Baseline 10/19/16: unable to perform sit to stand without UE support -- 5/10 still unable to complete 1 without use of UEs. 01/14/2017: able to perform x17 with airex on chair 02/15/2017: Able to perform sit to stand -- x12 with pillow on standard chair    Time 8   Period Weeks   Status On-going     PT LONG TERM GOAL #4   Title Pt will improve ABC by at least 13% in order to demonstrate clinically significant improvement in balance confidence.    Baseline 10/19/16: 46.88% 01/14/2017: 65%   Time 8   Period Weeks   Status Achieved     PT LONG TERM GOAL #5   Title Patient will improve SLS balance to over 10secs B to improve balance while high cabinets and decreasing fall risk.    Baseline 2 sec B   Time 6   Period Weeks   Status New               Plan - 03/15/17 1345    Clinical Impression Statement Patient continues to demonstrate improvement in strength. Patient demonstrates decreased balance with activities especially turning/rotation. Patient is making progress towards  long term goals but continues to demonstrate increased difficulty with narrow BOS. Patient will benefit ftom further skilled therapy therapy.    Rehab Potential Good   Clinical Impairments Affecting Rehab Potential Positive: motivation; Negative: possible decreased foot sensation, age   PT Frequency 2x / week   PT Duration 8 weeks   PT Treatment/Interventions ADLs/Self Care Home Management;Aquatic Therapy;Biofeedback;DME Instruction;Gait training;Stair training;Functional mobility training;Therapeutic activities;Therapeutic exercise;Balance training;Neuromuscular re-education;Patient/family education;Passive range of motion;Energy conservation   PT Next Visit Plan Progress balance and strengthening exercises,    PT Home Exercise Plan modified tandem balance, sit to  stand from elevated chair without UE support, standing yellow tband hip abduction, hooklying bridges   Consulted and Agree with Plan of Care Patient      Patient will benefit from skilled therapeutic intervention in order to improve the following deficits and impairments:  Abnormal gait, Pain, Decreased balance  Visit Diagnosis: Unsteadiness on feet  Muscle weakness (generalized)  History of falling     Problem List Patient Active Problem List   Diagnosis Date Noted  . Diarrhea 05/18/2016  . Fall 05/01/2016  . Unintentional weight  loss 12/30/2015  . Leg weakness, bilateral 12/30/2015  . Hyperkalemia 01/01/2015  . Occasional numbness/prickling/tingling of fingers and toes 12/18/2014  . Routine general medical examination at a health care facility 12/13/2013  . Macrocytic anemia 05/19/2013  . 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 Psoriasis   . Osteoarthritis, multiple sites   . Episodic mood disorder ([place]) 12/01/2011  . History of breast cancer 10/28/2011  . GERD (gastroesophageal reflux disease)   . Hyperlipidemia   . Hypertension   . Osteopenia   . Occlusion and stenosis of carotid artery without mention of cerebral infarction 05/27/2011    Blythe Stanford, PT DPT 03/15/2017, 1:49 PM  [facility] [address] [place], [address] Phone: [phone]   Fax:  [phone]  Name: [name] MRN: [id number] Date of Birth: 09-05-1940

## 2017-03-18 ENCOUNTER — Ambulatory Visit: Payer: Medicare Other

## 2017-03-18 DIAGNOSIS — R2681 Unsteadiness on feet: Secondary | ICD-10-CM

## 2017-03-18 DIAGNOSIS — M6281 Muscle weakness (generalized): Secondary | ICD-10-CM

## 2017-03-18 DIAGNOSIS — Z9181 History of falling: Secondary | ICD-10-CM

## 2017-03-18 NOTE — Therapy (Signed)
[facility] [address], [place], [address] Phone: [phone]   Fax:  [phone]  Physical Therapy Treatment  Patient Details  Name: [name] MRN: [id number] Date of Birth: 09-10-1940 Referring Provider: Dr. Caryl Bis  Encounter Date: 03/18/2017      PT End of Session - 03/18/17 1009    Visit Number 35   Number of Visits 50   Date for PT Re-Evaluation March 24, 2017   Authorization Type g codes 10/10   PT Start Time 1000   PT Stop Time 1030   PT Time Calculation (min) 30 min   Equipment Utilized During Treatment Gait belt   Activity Tolerance Patient tolerated treatment well   Behavior During Therapy [facility] for tasks assessed/performed      Past Medical History:  Diagnosis Date  . Breast cancer ([place]) 2001   left breast  . Cancer ([place]) 2001   left breast ca  . GERD (gastroesophageal reflux disease)   . Hyperlipidemia   . Hypertension   . Osteoarthritis, multiple sites   . Osteoarthrosis involving, or with mention of more than one site, but not specified as generalized, multiple sites   . Osteopenia   . Personal history of radiation therapy 2001   left breast ca  . Psoriasis     Past Surgical History:  Procedure Laterality Date  . ABDOMINAL HYSTERECTOMY    . ANKLE FRACTURE SURGERY  4/08   left---hardware still in place  . 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 BREAST EXCISIONAL BIOPSY Left yrs ago   benign  . BREAST LUMPECTOMY  2001   left  . CAROTID ENDARTERECTOMY Left 10/23/2004  . FRACTURE SURGERY    . SHOULDER SURGERY  6/07   left  . TONSILLECTOMY AND ADENOIDECTOMY    . TOTAL HIP ARTHROPLASTY  2004   right    There were no vitals filed for this visit.      Subjective Assessment - 03/18/17 1005    Subjective Patient reports no major changes compared to previous sessions. Patient reports she continues to demonstrate difficulty with turning.    Pertinent History Pt reports  that she has noticed worsening of her balance recently. She has had approximately 4 falls in the last 6 months. No known pattern to her falls. Pt reports that she gets in a rush and then she falls. Denies dizziness. Denies motion sensitivity. She has hardware in her L ankle from a fracture of both her tibia and fibula secondary to a fall. She notices worse balance in the AM. 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 No/denies      Observation: Single leg stance -- 4 sec B Sit to stand test with pillow on standard chair -- 18reps before fatigue  TREATMENT: Single leg stance in standing with intermittent UE support -- 1.5 min performed B Sit to stands from chair with pillow -- x 18 rep Nustep at seat level 9 -- level 4 for 2 min; level 3 for 3 min Serpentine pattern around 4 cones for balance -- x5 down and back for dynamic balance Side stepping over balance stones -- x 3 down and back without UE support  Patient demonstrates increased LE fatigue at end of session.          PT Education -  03/18/17 1008    Education provided Yes   Education Details form/technique with exercise   Person(s) Educated Patient   Methods Explanation;Demonstration   Comprehension Verbalized understanding;Returned demonstration             PT Long Term Goals - 03/18/17 1010      PT LONG TERM GOAL #1   Title Pt will be independent with HEP in order to improve strength and balance in order to decrease fall risk and improve function at home and work.    Time 8   Period Weeks   Status Achieved     PT LONG TERM GOAL #2   Title Pt will improve BERG by at least 3 points in order to demonstrate clinically significant improvement in balance.     Baseline 10/19/16: 44/56, 11/26/16 -- 47/56; 01/14/17: 49/56   Time 8   Period Weeks   Status Achieved      PT LONG TERM GOAL #3   Title Pt will be able to perform sit to stand from regular height chair x 10 without UE support to improve functional independence when raising up from low surfaces such as commode   Baseline 10/19/16: unable to perform sit to stand without UE support -- 5/10 still unable to complete 1 without use of UEs. 01/14/2017: able to perform x17 with airex on chair 02/15/2017: Able to perform sit to stand -- x12 with pillow on standard chair 03/18/17: x18 sit to stands with pillow on a chair     Time 8   Period Weeks   Status On-going     PT LONG TERM GOAL #4   Title Pt will improve ABC by at least 13% in order to demonstrate clinically significant improvement in balance confidence.    Baseline 10/19/16: 46.88% 01/14/2017: 65%   Time 8   Period Weeks   Status Achieved     PT LONG TERM GOAL #5   Title Patient will improve SLS balance to over 10secs B to improve balance while high cabinets and decreasing fall risk.    Baseline 2 sec B; 03/18/2017: 4 sec B   Time 6   Period Weeks   Status On-going               Plan - 03/18/17 1018    Clinical Impression Statement Patient demonstrates improvement with SLS and sit to stand performance indicating improvement in functional muscular strength and static balance. Although patient is improving, she continues to be unable to perform more than 10 sec of SLS and unability to perform sit to stand from standard height chair. Patient will benefit from further skilled therapy focused on improving functional strength  and balance to decrease fall risk.    Rehab Potential Good   Clinical Impairments Affecting Rehab Potential Positive: motivation; Negative: possible decreased foot sensation, age   PT Frequency 2x / week   PT Duration 8 weeks   PT Treatment/Interventions ADLs/Self Care Home Management;Aquatic Therapy;Biofeedback;DME Instruction;Gait training;Stair training;Functional mobility training;Therapeutic activities;Therapeutic  exercise;Balance training;Neuromuscular re-education;Patient/family education;Passive range of motion;Energy conservation   PT Next Visit Plan Progress balance and strengthening exercises,    PT Home Exercise Plan modified tandem balance, sit to stand from elevated chair without UE support, standing yellow tband hip abduction, hooklying bridges   Consulted and Agree with Plan of Care Patient      Patient will benefit from skilled therapeutic intervention in order to improve the following deficits and impairments:  Abnormal gait, Pain, Decreased balance  Visit Diagnosis: Unsteadiness  on feet  Muscle weakness (generalized)  History of falling     Problem List Patient Active Problem List   Diagnosis Date Noted  . Diarrhea 05/18/2016  . Fall 05/01/2016  . Unintentional weight loss 12/30/2015  . Leg weakness, bilateral 12/30/2015  . Hyperkalemia 01/01/2015  . Occasional numbness/prickling/tingling of fingers and toes 12/18/2014  . Routine general medical examination at a health care facility 12/13/2013  . Macrocytic anemia 05/19/2013  . 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 Psoriasis   . Osteoarthritis, multiple sites   . Episodic mood disorder ([place]) 12/01/2011  . History of breast cancer 10/28/2011  . GERD (gastroesophageal reflux disease)   . Hyperlipidemia   . Hypertension   . Osteopenia   . Occlusion and stenosis of carotid artery without mention of cerebral infarction 05/27/2011    Blythe Stanford, PT DPT 03/18/2017, 10:30 AM  [facility] [address] [place], [address] Phone: [phone]   Fax:  [phone]  Name: [name] MRN: [id number] Date of Birth: [birth year]

## 2017-03-18 NOTE — Addendum Note (Signed)
Addended by: Blain Pais on: 03/18/2017 10:57 AM   Modules accepted: Orders

## 2017-03-29 ENCOUNTER — Encounter: Payer: Self-pay | Admitting: Family Medicine

## 2017-03-29 ENCOUNTER — Telehealth: Payer: Self-pay | Admitting: Family Medicine

## 2017-03-29 ENCOUNTER — Ambulatory Visit (INDEPENDENT_AMBULATORY_CARE_PROVIDER_SITE_OTHER): Payer: Medicare Other | Admitting: Family Medicine

## 2017-03-29 ENCOUNTER — Ambulatory Visit
Admission: RE | Admit: 2017-03-29 | Discharge: 2017-03-29 | Disposition: A | Discharge status: Home / Self Care | Payer: Medicare Other | Source: Ambulatory Visit | Attending: Family Medicine | Admitting: Family Medicine

## 2017-03-29 VITALS — BP 122/82 | HR 65 | Temp 97.7°F | Wt 122.2 lb

## 2017-03-29 DIAGNOSIS — M7989 Other specified soft tissue disorders: Secondary | ICD-10-CM

## 2017-03-29 DIAGNOSIS — I10 Essential (primary) hypertension: Secondary | ICD-10-CM

## 2017-03-29 DIAGNOSIS — R634 Abnormal weight loss: Secondary | ICD-10-CM | POA: Diagnosis not present

## 2017-03-29 DIAGNOSIS — M25572 Pain in left ankle and joints of left foot: Secondary | ICD-10-CM | POA: Insufficient documentation

## 2017-03-29 DIAGNOSIS — F39 Unspecified mood [affective] disorder: Secondary | ICD-10-CM

## 2017-03-29 DIAGNOSIS — M79604 Pain in right leg: Secondary | ICD-10-CM | POA: Insufficient documentation

## 2017-03-29 DIAGNOSIS — M25511 Pain in right shoulder: Secondary | ICD-10-CM | POA: Insufficient documentation

## 2017-03-29 DIAGNOSIS — R600 Localized edema: Secondary | ICD-10-CM | POA: Diagnosis not present

## 2017-03-29 DIAGNOSIS — M25472 Effusion, left ankle: Secondary | ICD-10-CM | POA: Insufficient documentation

## 2017-03-29 NOTE — Patient Instructions (Signed)
Nice to see you. We'll get an ultrasound of your right leg. We will have to do physical therapy for your right shoulder. Please monitor your anxiety and depression. If they worsen please let us know.

## 2017-03-29 NOTE — Telephone Encounter (Signed)
[facility] call report patient negative for DVT right leg report in chart.

## 2017-03-29 NOTE — Assessment & Plan Note (Signed)
Weight has trended up a fair bit since starting on Remeron and getting her depression and anxiety under control. I suspect that was the cause of her weight loss. She'll continue to monitor.

## 2017-03-29 NOTE — Assessment & Plan Note (Signed)
New onset swelling in her right lower extremity with slight discomfort. There is a difference between the legs on measurement. We'll obtain an ultrasound to evaluate for DVT. Discussed need for anticoagulation if positive.

## 2017-03-29 NOTE — Telephone Encounter (Signed)
See other phone note

## 2017-03-29 NOTE — Assessment & Plan Note (Signed)
Suspect rotator cuff issue versus arthritic issue. We'll have her do physical therapy for this. Consider having her see her surgeon if not improving.

## 2017-03-29 NOTE — Telephone Encounter (Signed)
Spoke with patient regarding results from ultrasound. Negative for DVT. Swelling possibly related to venous insufficiency given ultrasound being negative. Discussed elevation of her leg and monitoring.

## 2017-03-29 NOTE — Progress Notes (Signed)
[name], MD Phone: [phone]  [name] is a 76 y.o. female who presents today for f/u.  Patient notes for the last week her right leg has been swollen compared to left. It also appears lumpy to her. She's not had any surgeries. She's not had any travel. No blood clot history. No prior swelling.  Hypertension: Not checking at home. She's taking metoprolol. No chest pain or shortness breath.  Anxiety/depression: Patient notes the Remeron is helped significantly. She notes no anxiety or depression. Her weight is up. She has not had any negative dreams though does have some positive odd dreams on the medication.  Patient reports her right shoulder has been bothering her catching at times. Particularly if she reaches a certain way. She had surgery on her left shoulder for similar symptoms and this was found to be related to bone spurs.  PMH: Former smoker   ROS see history of present illness  Objective  Physical Exam Vitals:   03/29/17 1352 03/29/17 1427  BP: (!) 130/92 122/82  Pulse: 65   Temp: 97.7 F (36.5 C)   SpO2: 98%     BP Readings from Last 3 Encounters:  03/29/17 122/82  12/29/16 140/88  12/21/16 (!) 158/102   Wt Readings from Last 3 Encounters:  03/29/17 122 lb 3.2 oz (55.4 kg)  12/21/16 116 lb (52.6 kg)  11/03/16 114 lb (51.7 kg)    Physical Exam  Constitutional: No distress.  Cardiovascular: Normal rate, regular rhythm and normal heart sounds.   Pulmonary/Chest: Effort normal and breath sounds normal.  Musculoskeletal:  Right lower extremity 32 cm, left lower extremity 29 cm, no calf tenderness or cords palpated in either leg, there are chronic varicose veins noted bilaterally, right shoulder with discomfort on active range of motion though not passive range of motion, no tenderness, positive speeds, negative empty can, left shoulder with no discomfort, negative speeds and empty can  Neurological: She is alert. Gait normal.  Skin: Skin is  warm and dry. She is not diaphoretic.  2+ DP pulses   Assessment/Plan: Please see individual problem list.  Episodic mood disorder Much improved on Remeron. Has trended up. Suspect weight loss is related to her anxiety and depression. She'll continue to monitor symptoms and if they worsen or return she'll let us know.  Unintentional weight loss Weight has trended up a fair bit since starting on Remeron and getting her depression and anxiety under control. I suspect that was the cause of her weight loss. She'll continue to monitor.  Pain and swelling of lower extremity, right New onset swelling in her right lower extremity with slight discomfort. There is a difference between the legs on measurement. We'll obtain an ultrasound to evaluate for DVT. Discussed need for anticoagulation if positive.  Right shoulder pain Suspect rotator cuff issue versus arthritic issue. We'll have her do physical therapy for this. Consider having her see her surgeon if not improving.  Hypertension At goal on recheck. Continue current medications.   Orders Placed This Encounter  Procedures  . US Venous Img Lower Unilateral Right    Standing Status:   Future    Standing Expiration Date:   05/29/2018    Order Specific Question:   Reason for Exam (SYMPTOM  OR DIAGNOSIS REQUIRED)    Answer:   right lower ext pain and swelling    Order Specific Question:   Preferred imaging location?    Answer:   [facility]    Order Specific Question:  Call Results- Best Contact Number?    Answer:   [phone] hold patient    [name], MD [place]

## 2017-03-29 NOTE — Assessment & Plan Note (Signed)
At goal on recheck. Continue current medications. 

## 2017-03-29 NOTE — Assessment & Plan Note (Signed)
Much improved on Remeron. Has trended up. Suspect weight loss is related to her anxiety and depression. She'll continue to monitor symptoms and if they worsen or return she'll let us know.

## 2017-03-30 ENCOUNTER — Ambulatory Visit: Payer: Medicare Other | Attending: Family Medicine

## 2017-03-30 DIAGNOSIS — Z9181 History of falling: Secondary | ICD-10-CM | POA: Diagnosis not present

## 2017-03-30 DIAGNOSIS — M6281 Muscle weakness (generalized): Secondary | ICD-10-CM | POA: Insufficient documentation

## 2017-03-30 DIAGNOSIS — R2681 Unsteadiness on feet: Secondary | ICD-10-CM | POA: Insufficient documentation

## 2017-03-30 NOTE — Therapy (Signed)
[facility] [address], [place], [address] Phone: [phone]   Fax:  [phone]  Physical Therapy Treatment  Patient Details  Name: [name] MRN: [id number] Date of Birth: 18-Dec-1940 Referring Provider: Dr. Caryl Bis  Encounter Date: 03/30/2017      PT End of Session - 03/30/17 1140    Visit Number 36   Number of Visits 50   Date for PT Re-Evaluation 2017/04/30   Authorization Type g codes 1/10   PT Start Time 10-30-1116   PT Stop Time 1200   PT Time Calculation (min) 42 min   Equipment Utilized During Treatment Gait belt   Activity Tolerance Patient tolerated treatment well   Behavior During Therapy [facility] for tasks assessed/performed      Past Medical History:  Diagnosis Date  . Breast cancer ([place]) 10/31/1999   left breast  . Cancer ([place]) 10/31/1999   left breast ca  . GERD (gastroesophageal reflux disease)   . Hyperlipidemia   . Hypertension   . Osteoarthritis, multiple sites   . Osteoarthrosis involving, or with mention of more than one site, but not specified as generalized, multiple sites   . Osteopenia   . Personal history of radiation therapy 31-Oct-1999   left breast ca  . Psoriasis     Past Surgical History:  Procedure Laterality Date  . ABDOMINAL HYSTERECTOMY    . ANKLE FRACTURE SURGERY  4/08   left---hardware still in place  . BREAST BIOPSY Left 10/31/1999   breast ca  . BREAST EXCISIONAL BIOPSY Left yrs ago   benign  . BREAST LUMPECTOMY  10/31/99   left  . CAROTID ENDARTERECTOMY Left 10/23/2004  . FRACTURE SURGERY    . SHOULDER SURGERY  6/07   left  . TONSILLECTOMY AND ADENOIDECTOMY    . TOTAL HIP ARTHROPLASTY  31-Oct-2002   right    There were no vitals filed for this visit.      Subjective Assessment - 03/30/17 1128    Subjective Patient reports she continues to have balance difficulties when ambulating over uneven ground. Patient reports trouble and difficulty with turning.     Pertinent  History Pt reports that she has noticed worsening of her balance recently. She has had approximately 4 falls in the last 6 months. No known pattern to her falls. Pt reports that she gets in a rush and then she falls. Denies dizziness. Denies motion sensitivity. She has hardware in her L ankle from a fracture of both her tibia and fibula secondary to a fall. She notices worse balance in the AM. 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 No/denies            TREATMENT: Nustep at seat level 9 -- level 3 for 5.5 min Serpentine pattern around 3 balance stones for balance - x10 down and back for dynamic balance Hip Machine hip abduction - 2 x 15 40# Standing feet together on half foam flat side up - x 20  Side stepping on balance stones in standing with intermittent UE support - x 20 B Squats with UE support performed on balance stones - x 20 Tandem ambulation over airex beam - x 10 down and back   Patient responds well to therapy requiring minimal UE support to perform exercises  PT Education - 03/30/17 1138    Education provided Yes   Education Details form/technique with exercise   Person(s) Educated Patient   Methods Explanation;Demonstration   Comprehension Verbalized understanding;Returned demonstration             PT Long Term Goals - 03/18/17 1010      PT LONG TERM GOAL #1   Title Pt will be independent with HEP in order to improve strength and balance in order to decrease fall risk and improve function at home and work.    Time 8   Period Weeks   Status Achieved     PT LONG TERM GOAL #2   Title Pt will improve BERG by at least 3 points in order to demonstrate clinically significant improvement in balance.     Baseline 10/19/16: 44/56, 11/26/16 -- 47/56; 01/14/17: 49/56   Time 8   Period Weeks   Status Achieved      PT LONG TERM GOAL #3   Title Pt will be able to perform sit to stand from regular height chair x 10 without UE support to improve functional independence when raising up from low surfaces such as commode   Baseline 10/19/16: unable to perform sit to stand without UE support -- 5/10 still unable to complete 1 without use of UEs. 01/14/2017: able to perform x17 with airex on chair 02/15/2017: Able to perform sit to stand -- x12 with pillow on standard chair 03/18/17: x18 sit to stands with pillow on a chair     Time 8   Period Weeks   Status On-going     PT LONG TERM GOAL #4   Title Pt will improve ABC by at least 13% in order to demonstrate clinically significant improvement in balance confidence.    Baseline 10/19/16: 46.88% 01/14/2017: 65%   Time 8   Period Weeks   Status Achieved     PT LONG TERM GOAL #5   Title Patient will improve SLS balance to over 10secs B to improve balance while high cabinets and decreasing fall risk.    Baseline 2 sec B; 03/18/2017: 4 sec B   Time 6   Period Weeks   Status On-going               Plan - 03/30/17 1149    Clinical Impression Statement Patient demonstrates increased muscular fatigue with exercises indicating decreased muscular endurance and strength. Patient required intermittent UE support to perform balancing exercises indicating poor static and dynamic balance. Patient will benefit from further skillled therapy to return to prior level of function.    Rehab Potential Good   Clinical Impairments Affecting Rehab Potential Positive: motivation; Negative: possible decreased foot sensation, age   PT Frequency 2x / week   PT Duration 8 weeks   PT Treatment/Interventions ADLs/Self Care Home Management;Aquatic Therapy;Biofeedback;DME Instruction;Gait training;Stair training;Functional mobility training;Therapeutic activities;Therapeutic exercise;Balance training;Neuromuscular re-education;Patient/family education;Passive range of motion;Energy  conservation   PT Next Visit Plan Progress balance and strengthening exercises,    PT Home Exercise Plan modified tandem balance, sit to stand from elevated chair without UE support, standing yellow tband hip abduction, hooklying bridges   Consulted and Agree with Plan of Care Patient      Patient will benefit from skilled therapeutic intervention in order to improve the following deficits and impairments:  Abnormal gait, Pain, Decreased balance  Visit Diagnosis: Unsteadiness on feet  Muscle weakness (generalized)  History of falling     Problem List Patient Active Problem List  Diagnosis Date Noted  . Pain and swelling of lower extremity, right 03/29/2017  . Right shoulder pain 03/29/2017  . Diarrhea 05/18/2016  . Fall 05/01/2016  . Unintentional weight loss 12/30/2015  . Leg weakness, bilateral 12/30/2015  . Hyperkalemia 01/01/2015  . Occasional numbness/prickling/tingling of fingers and toes 12/18/2014  . Routine general medical examination at a health care facility 12/13/2013  . Macrocytic anemia 05/19/2013  . 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 Psoriasis   . Osteoarthritis, multiple sites   . Episodic mood disorder ([place]) 12/01/2011  . History of breast cancer 10/28/2011  . GERD (gastroesophageal reflux disease)   . Hyperlipidemia   . Hypertension   . Osteopenia   . Occlusion and stenosis of carotid artery without mention of cerebral infarction 05/27/2011    Blythe Stanford, PT DPT 03/30/2017, 12:00 PM  [facility] [address] [place], [address] Phone: [phone]   Fax:  [phone]  Name: [name] MRN: [id number] Date of Birth: [birth year]

## 2017-04-01 DIAGNOSIS — H01003 Unspecified blepharitis right eye, unspecified eyelid: Secondary | ICD-10-CM | POA: Diagnosis not present

## 2017-04-01 DIAGNOSIS — H353132 Nonexudative age-related macular degeneration, bilateral, intermediate dry stage: Secondary | ICD-10-CM | POA: Diagnosis not present

## 2017-04-05 ENCOUNTER — Ambulatory Visit: Payer: Medicare Other

## 2017-04-05 DIAGNOSIS — Z9181 History of falling: Secondary | ICD-10-CM | POA: Diagnosis not present

## 2017-04-05 DIAGNOSIS — R2681 Unsteadiness on feet: Secondary | ICD-10-CM | POA: Diagnosis not present

## 2017-04-05 DIAGNOSIS — M6281 Muscle weakness (generalized): Secondary | ICD-10-CM | POA: Diagnosis not present
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
## 2017-04-08 ENCOUNTER — Encounter: Payer: Self-pay | Admitting: Physical Therapy

## 2017-04-08 ENCOUNTER — Ambulatory Visit: Payer: Medicare Other | Admitting: Physical Therapy

## 2017-04-08 DIAGNOSIS — M6281 Muscle weakness (generalized): Secondary | ICD-10-CM | POA: Diagnosis not present

## 2017-04-08 DIAGNOSIS — R2681 Unsteadiness on feet: Secondary | ICD-10-CM | POA: Diagnosis not present

## 2017-04-08 DIAGNOSIS — Z9181 History of falling: Secondary | ICD-10-CM | POA: Diagnosis not present

## 2017-04-08 NOTE — Therapy (Signed)
[facility] [address] [place], [address] Phone: [phone]   Fax:  [phone]  Physical Therapy Treatment  Patient Details  Name: [name] MRN: [id number] Date of Birth: September 21, 1940 Referring Provider: Dr. Caryl Bis  Encounter Date: 04/08/2017      PT End of Session - 04/08/17 1301    Visit Number 38   Number of Visits 50   Date for PT Re-Evaluation 05-07-17   Authorization Type g codes 2022/10/19   PT Start Time 1300   PT Stop Time 1343   PT Time Calculation (min) 43 min   Equipment Utilized During Treatment Gait belt   Activity Tolerance Patient tolerated treatment well   Behavior During Therapy [facility] for tasks assessed/performed      Past Medical History:  Diagnosis Date  . Breast cancer ([place]) 2001   left breast  . Cancer ([place]) 2001   left breast ca  . GERD (gastroesophageal reflux disease)   . Hyperlipidemia   . Hypertension   . Osteoarthritis, multiple sites   . Osteoarthrosis involving, or with mention of more than one site, but not specified as generalized, multiple sites   . Osteopenia   . Personal history of radiation therapy 2001   left breast ca  . Psoriasis     Past Surgical History:  Procedure Laterality Date  . ABDOMINAL HYSTERECTOMY    . ANKLE FRACTURE SURGERY  4/08   left---hardware still in place  . 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 BREAST EXCISIONAL BIOPSY Left yrs ago   benign  . BREAST LUMPECTOMY  2001   left  . CAROTID ENDARTERECTOMY Left 10/23/2004  . FRACTURE SURGERY    . SHOULDER SURGERY  6/07   left  . TONSILLECTOMY AND ADENOIDECTOMY    . TOTAL HIP ARTHROPLASTY  2004   right    There were no vitals filed for this visit.      Subjective Assessment - 04/08/17 1303    Subjective Pt reports the swelling in her RLE has been improving.  Pt would like to continue working on balance.  Pt reports she is active but has limited time completing her HEP  with her duties caring for her husband.     Pertinent History Pt reports that she has noticed worsening of her balance recently. She has had approximately 4 falls in the last 6 months. No known pattern to her falls. Pt reports that she gets in a rush and then she falls. Denies dizziness. Denies motion sensitivity. She has hardware in her L ankle from a fracture of both her tibia and fibula secondary to a fall. She notices worse balance in the AM. 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 Yes   Pain Score 5    Pain Location --  generalized arthritis   Pain Descriptors / Indicators Aching  arthritic pain   Pain Type Chronic pain   Multiple Pain Sites No       TREATMENT:  Forward walking on balance stones in tandem x1 minute Side stepping on balance stones x1 minute Ambulating in gym with spontaneous cues for direction changes forward, back, left, right.  x2 minutes Marching on airex x30 each LE with cues for increased hip flexion Laterally stepping up and over airex pad x20 each direction  Rhomberg stance on airex with ball toss to each side x20.  Pt reports she enjoyed this exercise because it was challenging and she would like to continue doing this at future sessions.   Lateral stepping over cone x20 each direction Resisted walking with gray theratube around waist x10 ft x5 each direction forward, back, left, right Ambulating in gym with vertical and horizontal head turns x2 minutes Forward step ups to 4" step x10           PT Education - 04/08/17 1301    Education provided Yes   Education Details Exercise technique   Person(s) Educated Patient   Methods Explanation;Demonstration;Verbal cues   Comprehension Verbalized understanding;Returned demonstration;Verbal cues required;Need further instruction             PT  Long Term Goals - 03/18/17 1010      PT LONG TERM GOAL #1   Title Pt will be independent with HEP in order to improve strength and balance in order to decrease fall risk and improve function at home and work.    Time 8   Period Weeks   Status Achieved     PT LONG TERM GOAL #2   Title Pt will improve BERG by at least 3 points in order to demonstrate clinically significant improvement in balance.     Baseline 10/19/16: 44/56, 11/26/16 -- 47/56; 01/14/17: 49/56   Time 8   Period Weeks   Status Achieved     PT LONG TERM GOAL #3   Title Pt will be able to perform sit to stand from regular height chair x 10 without UE support to improve functional independence when raising up from low surfaces such as commode   Baseline 10/19/16: unable to perform sit to stand without UE support -- 5/10 still unable to complete 1 without use of UEs. 01/14/2017: able to perform x17 with airex on chair 02/15/2017: Able to perform sit to stand -- x12 with pillow on standard chair 03/18/17: x18 sit to stands with pillow on a chair     Time 8   Period Weeks   Status On-going     PT LONG TERM GOAL #4   Title Pt will improve ABC by at least 13% in order to demonstrate clinically significant improvement in balance confidence.    Baseline 10/19/16: 46.88% 01/14/2017: 65%   Time 8   Period Weeks   Status Achieved     PT LONG TERM GOAL #5   Title Patient will improve SLS balance to over 10secs B to improve balance while high cabinets and decreasing fall risk.    Baseline 2 sec B; 03/18/2017: 4 sec B   Time 6   Period Weeks   Status On-going               Plan - 04/08/17 1343    Clinical Impression Statement Pt demonstrates a significant fear of falling with balance training, specifically with resisted walking.  She demonstrates impaired balance with single leg activities.  Pt performed well with head turns while ambulating.  She appears to be making progress with her balance but continues to demontrate instability  and will benefit from continued skilled PT interventions for improved strength and balance.    Rehab Potential Good   Clinical Impairments Affecting Rehab Potential Positive: motivation; Negative: possible decreased foot sensation, age   PT Frequency 2x / week   PT Duration 8 weeks   PT Treatment/Interventions ADLs/Self Care Home Management;Aquatic Therapy;Biofeedback;DME Instruction;Gait training;Stair training;Functional mobility training;Therapeutic  activities;Therapeutic exercise;Balance training;Neuromuscular re-education;Patient/family education;Passive range of motion;Energy conservation   PT Next Visit Plan Progress balance and strengthening exercises,    PT Home Exercise Plan modified tandem balance, sit to stand from elevated chair without UE support, standing yellow tband hip abduction, hooklying bridges   Consulted and Agree with Plan of Care Patient      Patient will benefit from skilled therapeutic intervention in order to improve the following deficits and impairments:  Abnormal gait, Pain, Decreased balance  Visit Diagnosis: Unsteadiness on feet  Muscle weakness (generalized)  History of falling     Problem List Patient Active Problem List   Diagnosis Date Noted  . Pain and swelling of lower extremity, right 03/29/2017  . Right shoulder pain 03/29/2017  . Diarrhea 05/18/2016  . Fall 05/01/2016  . Unintentional weight loss 12/30/2015  . Leg weakness, bilateral 12/30/2015  . Hyperkalemia 01/01/2015  . Occasional numbness/prickling/tingling of fingers and toes 12/18/2014  . Routine general medical examination at a health care facility 12/13/2013  . Macrocytic anemia 05/19/2013  . 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 Psoriasis   . Osteoarthritis, multiple sites   . Episodic mood disorder ([place]) 12/01/2011  . History of breast cancer 10/28/2011  . GERD (gastroesophageal reflux disease)   . Hyperlipidemia   . Hypertension   . Osteopenia   . Occlusion and  stenosis of carotid artery without mention of cerebral infarction 05/27/2011    Collie Siad PT, DPT 04/08/2017, 1:45 PM  [facility] [address], [place], [address] Phone: [phone]   Fax:  [phone]  Name: JANAIYA BEAUCHESNE MRN: [id number] Date of Birth: [birth year]

## 2017-04-12 ENCOUNTER — Ambulatory Visit: Payer: Medicare Other

## 2017-04-12 DIAGNOSIS — Z9181 History of falling: Secondary | ICD-10-CM | POA: Diagnosis not present

## 2017-04-12 DIAGNOSIS — M6281 Muscle weakness (generalized): Secondary | ICD-10-CM | POA: Diagnosis not present

## 2017-04-12 DIAGNOSIS — R2681 Unsteadiness on feet: Secondary | ICD-10-CM

## 2017-04-12 NOTE — Therapy (Signed)
[facility] [address], [place], [address] Phone: [phone]   Fax:  [phone]  Physical Therapy Treatment  Patient Details  Name: [name] MRN: [id number] Date of Birth: [birth year] Referring Provider: Dr. Caryl Bis  Encounter Date: 04/12/2017      PT End of Session - 04/12/17 1011    Visit Number 39   Number of Visits 50   Date for PT Re-Evaluation 04-26-2017   Authorization Type g codes 11/08/22   PT Start Time 0952   PT Stop Time 1030   PT Time Calculation (min) 38 min   Equipment Utilized During Treatment Gait belt   Activity Tolerance Patient tolerated treatment well   Behavior During Therapy [facility] for tasks assessed/performed      Past Medical History:  Diagnosis Date  . Breast cancer ([place]) 27-Oct-1999   left breast  . Cancer ([place]) 10/27/99   left breast ca  . GERD (gastroesophageal reflux disease)   . Hyperlipidemia   . Hypertension   . Osteoarthritis, multiple sites   . Osteoarthrosis involving, or with mention of more than one site, but not specified as generalized, multiple sites   . Osteopenia   . Personal history of radiation therapy October 27, 1999   left breast ca  . Psoriasis     Past Surgical History:  Procedure Laterality Date  . ABDOMINAL HYSTERECTOMY    . ANKLE FRACTURE SURGERY  4/08   left---hardware still in place  . BREAST BIOPSY Left 10/27/99   breast ca  . BREAST EXCISIONAL BIOPSY Left yrs ago   benign  . BREAST LUMPECTOMY  10-27-1999   left  . CAROTID ENDARTERECTOMY Left 10/23/2004  . FRACTURE SURGERY    . SHOULDER SURGERY  6/07   left  . TONSILLECTOMY AND ADENOIDECTOMY    . TOTAL HIP ARTHROPLASTY  2002/10/27   right    There were no vitals filed for this visit.      Subjective Assessment - 04/12/17 0956    Subjective Patient states she had a tough weekend because her husband was having difficulty. Patient states no major changes since the previous visit.    Pertinent History Pt  reports that she has noticed worsening of her balance recently. She has had approximately 4 falls in the last 6 months. No known pattern to her falls. Pt reports that she gets in a rush and then she falls. Denies dizziness. Denies motion sensitivity. She has hardware in her L ankle from a fracture of both her tibia and fibula secondary to a fall. She notices worse balance in the AM. 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 No/denies       TREATMENT:  Nustep - 5 min level 4 with cueing on speed of performance Side stepping up and over airex pad without UE support Marching on airex x30 each LE with cues for increased hip flexion Ambulating in gym with spontaneous cues for direction changes forward, back, left, right.  x2 minutes Forward walking on balance stones in tandem - x 10 down and back (4 cones)  Isometric hip flexion in sitting - x 15 Side stepping over 2 cones - x 15 Ambulating in gym with vertical, horizontal, and lateral  head turns x2 minutes Cone taps in standing while on airex pad - x20  Patient tolerates  therapy well with increased ease with hip flexion performance at end of session        PT Education - 04/12/17 1000    Education provided Yes   Education Details form/technique with exercise   Person(s) Educated Patient   Methods Explanation;Demonstration   Comprehension Verbalized understanding;Returned demonstration             PT Long Term Goals - 03/18/17 1010      PT LONG TERM GOAL #1   Title Pt will be independent with HEP in order to improve strength and balance in order to decrease fall risk and improve function at home and work.    Time 8   Period Weeks   Status Achieved     PT LONG TERM GOAL #2   Title Pt will improve BERG by at least 3 points in order to demonstrate clinically significant  improvement in balance.     Baseline 10/19/16: 44/56, 11/26/16 -- 47/56; 01/14/17: 49/56   Time 8   Period Weeks   Status Achieved     PT LONG TERM GOAL #3   Title Pt will be able to perform sit to stand from regular height chair x 10 without UE support to improve functional independence when raising up from low surfaces such as commode   Baseline 10/19/16: unable to perform sit to stand without UE support -- 5/10 still unable to complete 1 without use of UEs. 01/14/2017: able to perform x17 with airex on chair 02/15/2017: Able to perform sit to stand -- x12 with pillow on standard chair 03/18/17: x18 sit to stands with pillow on a chair     Time 8   Period Weeks   Status On-going     PT LONG TERM GOAL #4   Title Pt will improve ABC by at least 13% in order to demonstrate clinically significant improvement in balance confidence.    Baseline 10/19/16: 46.88% 01/14/2017: 65%   Time 8   Period Weeks   Status Achieved     PT LONG TERM GOAL #5   Title Patient will improve SLS balance to over 10secs B to improve balance while high cabinets and decreasing fall risk.    Baseline 2 sec B; 03/18/2017: 4 sec B   Time 6   Period Weeks   Status On-going               Plan - 04/12/17 1017    Clinical Impression Statement Patient demonstrates difficulty with hip flexion performance on the R hip indicating increased weakness and possible tendon involvement. Patient continues to demonstrates difficulty with single leg performance and turning while in standing. Patient will benefit from further skilled therapy to return to prior level of function.    Rehab Potential Good   Clinical Impairments Affecting Rehab Potential Positive: motivation; Negative: possible decreased foot sensation, age   PT Frequency 2x / week   PT Duration 8 weeks   PT Treatment/Interventions ADLs/Self Care Home Management;Aquatic Therapy;Biofeedback;DME Instruction;Gait training;Stair training;Functional mobility training;Therapeutic  activities;Therapeutic exercise;Balance training;Neuromuscular re-education;Patient/family education;Passive range of motion;Energy conservation   PT Next Visit Plan Progress balance and strengthening exercises,    PT Home Exercise Plan modified tandem balance, sit to stand from elevated chair without UE support, standing yellow tband hip abduction, hooklying bridges   Consulted and Agree with Plan of Care Patient      Patient will benefit from skilled therapeutic intervention in order to improve the following deficits and impairments:  Abnormal gait, Pain, Decreased balance  Visit Diagnosis: Unsteadiness on feet  Muscle weakness (generalized)  History of falling     Problem List Patient Active Problem List   Diagnosis Date Noted  . Pain and swelling of lower extremity, right 03/29/2017  . Right shoulder pain 03/29/2017  . Diarrhea 05/18/2016  . Fall 05/01/2016  . Unintentional weight loss 12/30/2015  . Leg weakness, bilateral 12/30/2015  . Hyperkalemia 01/01/2015  . Occasional numbness/prickling/tingling of fingers and toes 12/18/2014  . Routine general medical examination at a health care facility 12/13/2013  . Macrocytic anemia 05/19/2013  . 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 Psoriasis   . Osteoarthritis, multiple sites   . Episodic mood disorder ([place]) 12/01/2011  . History of breast cancer 10/28/2011  . GERD (gastroesophageal reflux disease)   . Hyperlipidemia   . Hypertension   . Osteopenia   . Occlusion and stenosis of carotid artery without mention of cerebral infarction 05/27/2011    Blythe Stanford, PT DPT 04/12/2017, 10:32 AM  [facility] [address], [place], [address] Phone: [phone]   Fax:  [phone]  Name: [name] MRN: [id number] Date of Birth: [birth year]

## 2017-04-15 ENCOUNTER — Ambulatory Visit: Payer: Medicare Other

## 2017-04-19 ENCOUNTER — Ambulatory Visit: Payer: Medicare Other | Attending: Family Medicine

## 2017-04-19 DIAGNOSIS — M6281 Muscle weakness (generalized): Secondary | ICD-10-CM | POA: Insufficient documentation

## 2017-04-19 DIAGNOSIS — R2681 Unsteadiness on feet: Secondary | ICD-10-CM

## 2017-04-19 DIAGNOSIS — Z9181 History of falling: Secondary | ICD-10-CM | POA: Insufficient documentation

## 2017-04-19 NOTE — Therapy (Signed)
[facility] [address], [place], [address] Phone: [phone]   Fax:  [phone]  Physical Therapy Treatment  Patient Details  Name: [name] MRN: [id number] Date of Birth: Jul 20, 1941 Referring Provider: Dr. Caryl Bis  Encounter Date: 04/19/2017      PT End of Session - 04/19/17 1028    Visit Number 40   Number of Visits 50   Date for PT Re-Evaluation May 20, 2017   Authorization Type g codes 5/10   PT Start Time 18-Oct-1005   PT Stop Time 1045   PT Time Calculation (min) 38 min   Equipment Utilized During Treatment Gait belt   Activity Tolerance Patient tolerated treatment well   Behavior During Therapy [facility] for tasks assessed/performed      Past Medical History:  Diagnosis Date  . Breast cancer ([place]) Oct 19, 1999   left breast  . Cancer ([place]) 1999-10-19   left breast ca  . GERD (gastroesophageal reflux disease)   . Hyperlipidemia   . Hypertension   . Osteoarthritis, multiple sites   . Osteoarthrosis involving, or with mention of more than one site, but not specified as generalized, multiple sites   . Osteopenia   . Personal history of radiation therapy 10-19-1999   left breast ca  . Psoriasis     Past Surgical History:  Procedure Laterality Date  . ABDOMINAL HYSTERECTOMY    . ANKLE FRACTURE SURGERY  4/08   left---hardware still in place  . BREAST BIOPSY Left 10/19/99   breast ca  . BREAST EXCISIONAL BIOPSY Left yrs ago   benign  . BREAST LUMPECTOMY  1999-10-19   left  . CAROTID ENDARTERECTOMY Left 10/23/2004  . FRACTURE SURGERY    . SHOULDER SURGERY  6/07   left  . TONSILLECTOMY AND ADENOIDECTOMY    . TOTAL HIP ARTHROPLASTY  2002/10/19   right    There were no vitals filed for this visit.      Subjective Assessment - 04/19/17 1026    Subjective Patient reports no major changes since the previous visit. Patient states she feels she is improving but conitnues to have difficulty with walking.    Pertinent  History Pt reports that she has noticed worsening of her balance recently. She has had approximately 4 falls in the last 6 months. No known pattern to her falls. Pt reports that she gets in a rush and then she falls. Denies dizziness. Denies motion sensitivity. She has hardware in her L ankle from a fracture of both her tibia and fibula secondary to a fall. She notices worse balance in the AM. 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 No/denies      TREATMENT: Therapeutic Exercise: Sit to stands with pillow on a chair -- x 20  Nustep level 4 with seat level 9 -- x 95min Tandem amb on airex beam with YTB around knees -- x 5 down/back Straddling airex beam with YTB around knees -- x5 down/back Side stepping down and back with YTB around knees -- x79ft x 4 Step ups with intermittent UE support -- x10  Observation:  FGA: 25/30  Patient demonstrates increased LE fatigue at end of session           PT Education - 04/19/17 1025/10/18    Education provided Yes   Education Details form/technique  with exercise   Person(s) Educated Patient   Methods Explanation;Demonstration   Comprehension Verbalized understanding;Returned demonstration             PT Long Term Goals - 04/19/17 1029      PT LONG TERM GOAL #1   Title Pt will be independent with HEP in order to improve strength and balance in order to decrease fall risk and improve function at home and work.    Time 8   Period Weeks   Status Achieved     PT LONG TERM GOAL #2   Title Pt will improve BERG by at least 3 points in order to demonstrate clinically significant improvement in balance.     Baseline 10/19/16: 44/56, 11/26/16 -- 47/56; 01/14/17: 49/56   Time 8   Period Weeks   Status Achieved     PT LONG TERM GOAL #3   Title Pt will be able to perform sit to stand from  regular height chair x 10 without UE support to improve functional independence when raising up from low surfaces such as commode   Baseline 10/19/16: unable to perform sit to stand without UE support -- 5/10 still unable to complete 1 without use of UEs. 01/14/2017: able to perform x17 with airex on chair 02/15/2017: Able to perform sit to stand -- x12 with pillow on standard chair 03/18/17: x18 sit to stands with pillow on a chair; 04/19/17: x20 sit to stands with pillow on the chair    Time 8   Period Weeks   Status On-going     PT LONG TERM GOAL #4   Title Pt will improve ABC by at least 13% in order to demonstrate clinically significant improvement in balance confidence.    Baseline 10/19/16: 46.88% 01/14/2017: 65%   Time 8   Period Weeks   Status Achieved     PT LONG TERM GOAL #5   Title Patient will improve SLS balance to over 10secs B to improve balance while high cabinets and decreasing fall risk.    Baseline 2 sec B; 03/18/2017: 4 sec B; 04/19/17: 6 sec   Time 6   Period Weeks   Status On-going     Additional Long Term Goals   Additional Long Term Goals Yes     PT LONG TERM GOAL #6   Title Patient will improve FGA to over 28/30 to improve ability to ambulate over varied surfaces such as grass when at home.    Baseline 25/30   Time 6   Period Weeks   Status New               Plan - 04/19/17 1043    Clinical Impression Statement Patient demonstrates improvement in single leg stance and sit to stand amount completed until fatigue indicating functional improvement in strength and static balance. Patient continues to have difficulty with dynamic balance as indicated by a decreased functional gait assessment score indicating increased fall risk on varied surface when ambulating. Patient will benefit from further skilled therapy focused on improving limitations to return to prior level of function.    Rehab Potential Good   Clinical Impairments Affecting Rehab Potential Positive:  motivation; Negative: possible decreased foot sensation, age   PT Frequency 2x / week   PT Duration 8 weeks   PT Treatment/Interventions ADLs/Self Care Home Management;Aquatic Therapy;Biofeedback;DME Instruction;Gait training;Stair training;Functional mobility training;Therapeutic activities;Therapeutic exercise;Balance training;Neuromuscular re-education;Patient/family education;Passive range of motion;Energy conservation   PT Next Visit Plan Progress balance and strengthening exercises,  PT Home Exercise Plan modified tandem balance, sit to stand from elevated chair without UE support, standing yellow tband hip abduction, hooklying bridges   Consulted and Agree with Plan of Care Patient      Patient will benefit from skilled therapeutic intervention in order to improve the following deficits and impairments:  Abnormal gait, Pain, Decreased balance  Visit Diagnosis: Unsteadiness on feet - Plan: PT plan of care cert/re-cert  Muscle weakness (generalized) - Plan: PT plan of care cert/re-cert  History of falling - Plan: PT plan of care cert/re-cert     Problem List Patient Active Problem List   Diagnosis Date Noted  . Pain and swelling of lower extremity, right 03/29/2017  . Right shoulder pain 03/29/2017  . Diarrhea 05/18/2016  . Fall 05/01/2016  . Unintentional weight loss 12/30/2015  . Leg weakness, bilateral 12/30/2015  . Hyperkalemia 01/01/2015  . Occasional numbness/prickling/tingling of fingers and toes 12/18/2014  . Routine general medical examination at a health care facility 12/13/2013  . Macrocytic anemia 05/19/2013  . 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 Psoriasis   . Osteoarthritis, multiple sites   . Episodic mood disorder ([place]) 12/01/2011  . History of breast cancer 10/28/2011  . GERD (gastroesophageal reflux disease)   . Hyperlipidemia   . Hypertension   . Osteopenia   . Occlusion and stenosis of carotid artery without mention of cerebral infarction  05/27/2011    Blythe Stanford, PT DPT 04/19/2017, 10:47 AM  [facility] [address] [place], [address] Phone: [phone]   Fax:  [phone]  Name: [name] MRN: [id number] Date of Birth: 17-Jul-1941

## 2017-04-22 ENCOUNTER — Ambulatory Visit: Payer: Medicare Other

## 2017-04-22 DIAGNOSIS — M6281 Muscle weakness (generalized): Secondary | ICD-10-CM | POA: Diagnosis not present

## 2017-04-22 DIAGNOSIS — Z9181 History of falling: Secondary | ICD-10-CM | POA: Diagnosis not present

## 2017-04-22 DIAGNOSIS — R2681 Unsteadiness on feet: Secondary | ICD-10-CM | POA: Diagnosis not present

## 2017-04-22 NOTE — Therapy (Signed)
[facility] [address], [place], [address] Phone: [phone]   Fax:  [phone]  Physical Therapy Treatment  Patient Details  Name: [name] MRN: [id number] Date of Birth: Nov 29, 1940 Referring Provider: Dr. Caryl Bis  Encounter Date: 04/22/2017      PT End of Session - 04/22/17 1425    Visit Number 41   Number of Visits 50   Date for PT Re-Evaluation 2017/05/20   Authorization Type g codes 6/10   PT Start Time 1941   PT Stop Time 1430   PT Time Calculation (min) 45 min   Equipment Utilized During Treatment Gait belt   Activity Tolerance Patient tolerated treatment well   Behavior During Therapy [facility] for tasks assessed/performed      Past Medical History:  Diagnosis Date  . Breast cancer ([place]) 2001   left breast  . Cancer ([place]) 2001   left breast ca  . GERD (gastroesophageal reflux disease)   . Hyperlipidemia   . Hypertension   . Osteoarthritis, multiple sites   . Osteoarthrosis involving, or with mention of more than one site, but not specified as generalized, multiple sites   . Osteopenia   . Personal history of radiation therapy 2001   left breast ca  . Psoriasis     Past Surgical History:  Procedure Laterality Date  . ABDOMINAL HYSTERECTOMY    . ANKLE FRACTURE SURGERY  4/08   left---hardware still in place  . 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 BREAST EXCISIONAL BIOPSY Left yrs ago   benign  . BREAST LUMPECTOMY  2001   left  . CAROTID ENDARTERECTOMY Left 10/23/2004  . FRACTURE SURGERY    . SHOULDER SURGERY  6/07   left  . TONSILLECTOMY AND ADENOIDECTOMY    . TOTAL HIP ARTHROPLASTY  2004   right    There were no vitals filed for this visit.      Subjective Assessment - 04/22/17 1420    Subjective Patient reports no major changes since the previous visit, patient states she has difficulty with walking.    Pertinent History Pt reports that she has noticed  worsening of her balance recently. She has had approximately 4 falls in the last 6 months. No known pattern to her falls. Pt reports that she gets in a rush and then she falls. Denies dizziness. Denies motion sensitivity. She has hardware in her L ankle from a fracture of both her tibia and fibula secondary to a fall. She notices worse balance in the AM. 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 No/denies        TREATMENT: Therapeutic Exercise: Nustep level 4 with seat level 9 -- x 44min; level 5 -2 min  Tandem stance ball throws - x 20 B  Step ups onto Blu side of the [place] - 2 x 10  Side stepping with ball tosss - 4 x 18ft ; 4 x 59ft without ball toss Side stepping around cones - 4 x 39ft; with ball toss around cones 4  x 73ft Hip abduction at machine - 2 x 15 B 40#; 2 x 20 55#  Sit to stands with pillow on a chair -- x 20    Patient demonstrates increased LE fatigue at end of session  PT Education - 04/22/17 1424    Education provided Yes   Education Details form/technique with exercise   Person(s) Educated Patient   Methods Explanation;Demonstration   Comprehension Verbalized understanding;Returned demonstration             PT Long Term Goals - 04/19/17 1029      PT LONG TERM GOAL #1   Title Pt will be independent with HEP in order to improve strength and balance in order to decrease fall risk and improve function at home and work.    Time 8   Period Weeks   Status Achieved     PT LONG TERM GOAL #2   Title Pt will improve BERG by at least 3 points in order to demonstrate clinically significant improvement in balance.     Baseline 10/19/16: 44/56, 11/26/16 -- 47/56; 01/14/17: 49/56   Time 8   Period Weeks   Status Achieved     PT LONG TERM GOAL #3   Title Pt will be able to perform sit to stand from regular  height chair x 10 without UE support to improve functional independence when raising up from low surfaces such as commode   Baseline 10/19/16: unable to perform sit to stand without UE support -- 5/10 still unable to complete 1 without use of UEs. 01/14/2017: able to perform x17 with airex on chair 02/15/2017: Able to perform sit to stand -- x12 with pillow on standard chair 03/18/17: x18 sit to stands with pillow on a chair; 04/19/17: x20 sit to stands with pillow on the chair    Time 8   Period Weeks   Status On-going     PT LONG TERM GOAL #4   Title Pt will improve ABC by at least 13% in order to demonstrate clinically significant improvement in balance confidence.    Baseline 10/19/16: 46.88% 01/14/2017: 65%   Time 8   Period Weeks   Status Achieved     PT LONG TERM GOAL #5   Title Patient will improve SLS balance to over 10secs B to improve balance while high cabinets and decreasing fall risk.    Baseline 2 sec B; 03/18/2017: 4 sec B; 04/19/17: 6 sec   Time 6   Period Weeks   Status On-going     Additional Long Term Goals   Additional Long Term Goals Yes     PT LONG TERM GOAL #6   Title Patient will improve FGA to over 28/30 to improve ability to ambulate over varied surfaces such as grass when at home.    Baseline 25/30   Time 6   Period Weeks   Status New               Plan - 04/22/17 1428    Clinical Impression Statement Patient demonstrates improvement with dynamic balance with ability to perform ball toss with side stepping. Although patient is improving, she demonstrates poor balance in single leg stance and requires performance of static balance exercises in tandem. Patient will benefit from further skilled therapy to return to prior level of function.     Rehab Potential Good   Clinical Impairments Affecting Rehab Potential Positive: motivation; Negative: possible decreased foot sensation, age   PT Frequency 2x / week   PT Duration 8 weeks   PT Treatment/Interventions  ADLs/Self Care Home Management;Aquatic Therapy;Biofeedback;DME Instruction;Gait training;Stair training;Functional mobility training;Therapeutic activities;Therapeutic exercise;Balance training;Neuromuscular re-education;Patient/family education;Passive range of motion;Energy conservation   PT Next Visit Plan Progress balance and strengthening exercises,  PT Home Exercise Plan modified tandem balance, sit to stand from elevated chair without UE support, standing yellow tband hip abduction, hooklying bridges   Consulted and Agree with Plan of Care Patient      Patient will benefit from skilled therapeutic intervention in order to improve the following deficits and impairments:  Abnormal gait, Pain, Decreased balance  Visit Diagnosis: Unsteadiness on feet  Muscle weakness (generalized)  History of falling     Problem List Patient Active Problem List   Diagnosis Date Noted  . Pain and swelling of lower extremity, right 03/29/2017  . Right shoulder pain 03/29/2017  . Diarrhea 05/18/2016  . Fall 05/01/2016  . Unintentional weight loss 12/30/2015  . Leg weakness, bilateral 12/30/2015  . Hyperkalemia 01/01/2015  . Occasional numbness/prickling/tingling of fingers and toes 12/18/2014  . Routine general medical examination at a health care facility 12/13/2013  . Macrocytic anemia 05/19/2013  . 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 Psoriasis   . Osteoarthritis, multiple sites   . Episodic mood disorder ([place]) 12/01/2011  . History of breast cancer 10/28/2011  . GERD (gastroesophageal reflux disease)   . Hyperlipidemia   . Hypertension   . Osteopenia   . Occlusion and stenosis of carotid artery without mention of cerebral infarction 05/27/2011    Blythe Stanford, PT DPT 04/22/2017, 2:33 PM  [facility] [address] [place], [address] Phone: [phone]   Fax:  [phone]  Name: HELAYNE METSKER MRN: [id number] Date of Birth: 1941/06/10

## 2017-04-24 DIAGNOSIS — Z23 Encounter for immunization: Secondary | ICD-10-CM | POA: Diagnosis not present

## 2017-04-26 ENCOUNTER — Ambulatory Visit: Payer: Medicare Other

## 2017-04-26 DIAGNOSIS — M6281 Muscle weakness (generalized): Secondary | ICD-10-CM

## 2017-04-26 DIAGNOSIS — R2681 Unsteadiness on feet: Secondary | ICD-10-CM | POA: Diagnosis not present

## 2017-04-26 DIAGNOSIS — Z9181 History of falling: Secondary | ICD-10-CM | POA: Diagnosis not present

## 2017-04-26 NOTE — Therapy (Signed)
[facility] [address], [place], [address] Phone: [phone]   Fax:  [phone]  Physical Therapy Treatment  Patient Details  Name: [name] MRN: [id number] Date of Birth: 1940/07/31 Referring Provider: Dr. Caryl Bis  Encounter Date: 04/26/2017      PT End of Session - 04/26/17 1033    Visit Number 42   Number of Visits 50   Date for PT Re-Evaluation 2017/05/28   Authorization Type g codes 7/10   PT Start Time October 26, 1005   PT Stop Time 1045   PT Time Calculation (min) 38 min   Equipment Utilized During Treatment Gait belt   Activity Tolerance Patient tolerated treatment well   Behavior During Therapy [facility] for tasks assessed/performed      Past Medical History:  Diagnosis Date  . Breast cancer ([place]) 1999-10-27   left breast  . Cancer ([place]) 10-27-1999   left breast ca  . GERD (gastroesophageal reflux disease)   . Hyperlipidemia   . Hypertension   . Osteoarthritis, multiple sites   . Osteoarthrosis involving, or with mention of more than one site, but not specified as generalized, multiple sites   . Osteopenia   . Personal history of radiation therapy Oct 27, 1999   left breast ca  . Psoriasis     Past Surgical History:  Procedure Laterality Date  . ABDOMINAL HYSTERECTOMY    . ANKLE FRACTURE SURGERY  4/08   left---hardware still in place  . BREAST BIOPSY Left 1999/10/27   breast ca  . BREAST EXCISIONAL BIOPSY Left yrs ago   benign  . BREAST LUMPECTOMY  October 27, 1999   left  . CAROTID ENDARTERECTOMY Left 10/23/2004  . FRACTURE SURGERY    . SHOULDER SURGERY  6/07   left  . TONSILLECTOMY AND ADENOIDECTOMY    . TOTAL HIP ARTHROPLASTY  10-27-02   right    There were no vitals filed for this visit.      Subjective Assessment - 04/26/17 1010    Subjective Patient reports she continues to have balance issues with walking.    Pertinent History Pt reports that she has noticed worsening of her balance recently. She has had  approximately 4 falls in the last 6 months. No known pattern to her falls. Pt reports that she gets in a rush and then she falls. Denies dizziness. Denies motion sensitivity. She has hardware in her L ankle from a fracture of both her tibia and fibula secondary to a fall. She notices worse balance in the AM. 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 No/denies         TREATMENT: Therapeutic Exercise: Nustep level 4 with seat level 9 -- x 61min Side stepping with ball tosss - 4 x 3ft  Walking with ball follow with head turns L/R - 4 x 30 Side stepping with high knee with airex pad - x 20  Standing feet apart on wobbleboard - 2 x 60sec Feet together balance on half foam - 2 x 60sec Hip machine- hip abduction - 40#  2 x 15 B Sit to stands with pillow on a chair -- x 20  Obstacle course: walking around 2 cones; stepping over 4 point canes with complete turns at the end of each course - x10 laps   Patient demonstrates increased LE fatigue at  end of session         PT Education - 04/26/17 1013    Education provided Yes   Education Details form/technique with exercise   Person(s) Educated Patient   Methods Explanation;Demonstration   Comprehension Verbalized understanding;Returned demonstration             PT Long Term Goals - 04/19/17 1029      PT LONG TERM GOAL #1   Title Pt will be independent with HEP in order to improve strength and balance in order to decrease fall risk and improve function at home and work.    Time 8   Period Weeks   Status Achieved     PT LONG TERM GOAL #2   Title Pt will improve BERG by at least 3 points in order to demonstrate clinically significant improvement in balance.     Baseline 10/19/16: 44/56, 11/26/16 -- 47/56; 01/14/17: 49/56   Time 8   Period Weeks   Status Achieved     PT LONG TERM  GOAL #3   Title Pt will be able to perform sit to stand from regular height chair x 10 without UE support to improve functional independence when raising up from low surfaces such as commode   Baseline 10/19/16: unable to perform sit to stand without UE support -- 5/10 still unable to complete 1 without use of UEs. 01/14/2017: able to perform x17 with airex on chair 02/15/2017: Able to perform sit to stand -- x12 with pillow on standard chair 03/18/17: x18 sit to stands with pillow on a chair; 04/19/17: x20 sit to stands with pillow on the chair    Time 8   Period Weeks   Status On-going     PT LONG TERM GOAL #4   Title Pt will improve ABC by at least 13% in order to demonstrate clinically significant improvement in balance confidence.    Baseline 10/19/16: 46.88% 01/14/2017: 65%   Time 8   Period Weeks   Status Achieved     PT LONG TERM GOAL #5   Title Patient will improve SLS balance to over 10secs B to improve balance while high cabinets and decreasing fall risk.    Baseline 2 sec B; 03/18/2017: 4 sec B; 04/19/17: 6 sec   Time 6   Period Weeks   Status On-going     Additional Long Term Goals   Additional Long Term Goals Yes     PT LONG TERM GOAL #6   Title Patient will improve FGA to over 28/30 to improve ability to ambulate over varied surfaces such as grass when at home.    Baseline 25/30   Time 6   Period Weeks   Status New               Plan - 04/26/17 1033    Clinical Impression Statement Focused more on balance today to improve ability to perform ADL such as cooking and vaccuming in standing without loss of balance. Patient demonstrates difficulty with single leg stance balance and ambulation with subsequent challenges to improve righting responses and improve ability to perform standing activites without worrying of a fall. Patient continues to demonstrate incrceased postural sway with balance exercises and will benefit from further skilled therapy to return to prior level of  function.    Rehab Potential Good   Clinical Impairments Affecting Rehab Potential Positive: motivation; Negative: possible decreased foot sensation, age   PT Frequency 2x / week   PT Duration 8 weeks  PT Treatment/Interventions ADLs/Self Care Home Management;Aquatic Therapy;Biofeedback;DME Instruction;Gait training;Stair training;Functional mobility training;Therapeutic activities;Therapeutic exercise;Balance training;Neuromuscular re-education;Patient/family education;Passive range of motion;Energy conservation   PT Next Visit Plan Progress balance and strengthening exercises,    PT Home Exercise Plan modified tandem balance, sit to stand from elevated chair without UE support, standing yellow tband hip abduction, hooklying bridges   Consulted and Agree with Plan of Care Patient      Patient will benefit from skilled therapeutic intervention in order to improve the following deficits and impairments:  Abnormal gait, Pain, Decreased balance  Visit Diagnosis: Unsteadiness on feet  Muscle weakness (generalized)  History of falling     Problem List Patient Active Problem List   Diagnosis Date Noted  . Pain and swelling of lower extremity, right 03/29/2017  . Right shoulder pain 03/29/2017  . Diarrhea 05/18/2016  . Fall 05/01/2016  . Unintentional weight loss 12/30/2015  . Leg weakness, bilateral 12/30/2015  . Hyperkalemia 01/01/2015  . Occasional numbness/prickling/tingling of fingers and toes 12/18/2014  . Routine general medical examination at a health care facility 12/13/2013  . Macrocytic anemia 05/19/2013  . 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 Psoriasis   . Osteoarthritis, multiple sites   . Episodic mood disorder ([place]) 12/01/2011  . History of breast cancer 10/28/2011  . GERD (gastroesophageal reflux disease)   . Hyperlipidemia   . Hypertension   . Osteopenia   . Occlusion and stenosis of carotid artery without mention of cerebral infarction 05/27/2011     Blythe Stanford, PT DPT 04/26/2017, 11:18 AM  [facility] [address], [place], [address] Phone: [phone]   Fax:  [phone]  Name: [name] MRN: [id number] Date of Birth: September 10, 1940

## 2017-04-29 ENCOUNTER — Ambulatory Visit: Payer: Medicare Other

## 2017-04-29 DIAGNOSIS — M6281 Muscle weakness (generalized): Secondary | ICD-10-CM | POA: Diagnosis not present

## 2017-04-29 DIAGNOSIS — Z9181 History of falling: Secondary | ICD-10-CM

## 2017-04-29 DIAGNOSIS — R2681 Unsteadiness on feet: Secondary | ICD-10-CM

## 2017-04-29 NOTE — Therapy (Signed)
[facility] [address] [place], [address] Phone: [phone]   Fax:  [phone]  Physical Therapy Treatment  Patient Details  Name: [name] MRN: [id number] Date of Birth: 1940-12-05 Referring Provider: Dr. Caryl Bis  Encounter Date: 04/29/2017      PT End of Session - 04/29/17 1337    Visit Number 43   Number of Visits 50   Date for PT Re-Evaluation 2017/05/26   Authorization Type g codes 8/10   PT Start Time 1330   PT Stop Time 1415   PT Time Calculation (min) 45 min   Equipment Utilized During Treatment Gait belt   Activity Tolerance Patient tolerated treatment well   Behavior During Therapy [facility] for tasks assessed/performed      Past Medical History:  Diagnosis Date  . Breast cancer ([place]) 2001   left breast  . Cancer ([place]) 2001   left breast ca  . GERD (gastroesophageal reflux disease)   . Hyperlipidemia   . Hypertension   . Osteoarthritis, multiple sites   . Osteoarthrosis involving, or with mention of more than one site, but not specified as generalized, multiple sites   . Osteopenia   . Personal history of radiation therapy 2001   left breast ca  . Psoriasis     Past Surgical History:  Procedure Laterality Date  . ABDOMINAL HYSTERECTOMY    . ANKLE FRACTURE SURGERY  4/08   left---hardware still in place  . 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 BREAST EXCISIONAL BIOPSY Left yrs ago   benign  . BREAST LUMPECTOMY  2001   left  . CAROTID ENDARTERECTOMY Left 10/23/2004  . FRACTURE SURGERY    . SHOULDER SURGERY  6/07   left  . TONSILLECTOMY AND ADENOIDECTOMY    . TOTAL HIP ARTHROPLASTY  2004   right    There were no vitals filed for this visit.      Subjective Assessment - 04/29/17 1334    Subjective Patient reports no major changes since the previous session. Patient reports she continues to have difficuly with turning when in standing.    Pertinent History Pt  reports that she has noticed worsening of her balance recently. She has had approximately 4 falls in the last 6 months. No known pattern to her falls. Pt reports that she gets in a rush and then she falls. Denies dizziness. Denies motion sensitivity. She has hardware in her L ankle from a fracture of both her tibia and fibula secondary to a fall. She notices worse balance in the AM. 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 No/denies      TREATMENT: Therapeutic Exercise: Nustep level 5 with seat level 9 -- x 90min, level 4 - x 2 min  Feet together balance on blue airex pad - 20 sec x 6  Hip abduction in standing with UE support - 2 x 20  Marches in standing on balance stones - 2 x 20  Standing feet apart on wobbleboard - 2 x 60sec Standing feet apart mini squats on wobbleboard - x 20  Side stepping over 4 point canes (2 canes) - x10 down and back Step ups onto a 14" step - 2 x 5 B LEs Hip machine- hip abduction - 40#  2 x 15 B  Patient demonstrates increased LE fatigue at end of session       PT Education - 04/29/17 1336    Education provided Yes   Education Details form/technique with exercise   Person(s) Educated Patient   Methods Explanation;Demonstration   Comprehension Verbalized understanding;Returned demonstration             PT Long Term Goals - 04/19/17 1029      PT LONG TERM GOAL #1   Title Pt will be independent with HEP in order to improve strength and balance in order to decrease fall risk and improve function at home and work.    Time 8   Period Weeks   Status Achieved     PT LONG TERM GOAL #2   Title Pt will improve BERG by at least 3 points in order to demonstrate clinically significant improvement in balance.     Baseline 10/19/16: 44/56, 11/26/16 -- 47/56; 01/14/17: 49/56   Time 8   Period Weeks    Status Achieved     PT LONG TERM GOAL #3   Title Pt will be able to perform sit to stand from regular height chair x 10 without UE support to improve functional independence when raising up from low surfaces such as commode   Baseline 10/19/16: unable to perform sit to stand without UE support -- 5/10 still unable to complete 1 without use of UEs. 01/14/2017: able to perform x17 with airex on chair 02/15/2017: Able to perform sit to stand -- x12 with pillow on standard chair 03/18/17: x18 sit to stands with pillow on a chair; 04/19/17: x20 sit to stands with pillow on the chair    Time 8   Period Weeks   Status On-going     PT LONG TERM GOAL #4   Title Pt will improve ABC by at least 13% in order to demonstrate clinically significant improvement in balance confidence.    Baseline 10/19/16: 46.88% 01/14/2017: 65%   Time 8   Period Weeks   Status Achieved     PT LONG TERM GOAL #5   Title Patient will improve SLS balance to over 10secs B to improve balance while high cabinets and decreasing fall risk.    Baseline 2 sec B; 03/18/2017: 4 sec B; 04/19/17: 6 sec   Time 6   Period Weeks   Status On-going     Additional Long Term Goals   Additional Long Term Goals Yes     PT LONG TERM GOAL #6   Title Patient will improve FGA to over 28/30 to improve ability to ambulate over varied surfaces such as grass when at home.    Baseline 25/30   Time 6   Period Weeks   Status New               Plan - 04/29/17 1413    Clinical Impression Statement Continued to focus on improving power and strength with exercises and patient demonstrates decreased strength and balance with exercise performance. Although patient demonstrates decreased strength, her static stance balance is improved compared to the previous visit indicating functional carryover. Patient will benefit from further skilled therapy to return to prior level of function.    Rehab Potential Good   Clinical Impairments Affecting Rehab Potential  Positive: motivation; Negative: possible decreased foot sensation, age   PT Frequency 2x / week   PT Duration 8 weeks   PT Treatment/Interventions ADLs/Self Care Home Management;Aquatic Therapy;Biofeedback;DME Instruction;Gait training;Stair training;Functional mobility training;Therapeutic activities;Therapeutic exercise;Balance training;Neuromuscular re-education;Patient/family education;Passive  range of motion;Energy conservation   PT Next Visit Plan Progress balance and strengthening exercises,    PT Home Exercise Plan modified tandem balance, sit to stand from elevated chair without UE support, standing yellow tband hip abduction, hooklying bridges   Consulted and Agree with Plan of Care Patient      Patient will benefit from skilled therapeutic intervention in order to improve the following deficits and impairments:  Abnormal gait, Pain, Decreased balance  Visit Diagnosis: Unsteadiness on feet  Muscle weakness (generalized)  History of falling     Problem List Patient Active Problem List   Diagnosis Date Noted  . Pain and swelling of lower extremity, right 03/29/2017  . Right shoulder pain 03/29/2017  . Diarrhea 05/18/2016  . Fall 05/01/2016  . Unintentional weight loss 12/30/2015  . Leg weakness, bilateral 12/30/2015  . Hyperkalemia 01/01/2015  . Occasional numbness/prickling/tingling of fingers and toes 12/18/2014  . Routine general medical examination at a health care facility 12/13/2013  . Macrocytic anemia 05/19/2013  . 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 Psoriasis   . Osteoarthritis, multiple sites   . Episodic mood disorder ([place]) 12/01/2011  . History of breast cancer 10/28/2011  . GERD (gastroesophageal reflux disease)   . Hyperlipidemia   . Hypertension   . Osteopenia   . Occlusion and stenosis of carotid artery without mention of cerebral infarction 05/27/2011    Blythe Stanford, PT DPT 04/29/2017, 2:17 PM  [facility] [address] [place], [address] Phone: [phone]   Fax:  [phone]  Name: QUIANA COBAUGH MRN: [id number] Date of Birth: Oct 31, 1940

## 2017-05-03 ENCOUNTER — Ambulatory Visit: Payer: Medicare Other

## 2017-05-06 ENCOUNTER — Ambulatory Visit: Payer: Medicare Other

## 2017-05-06 DIAGNOSIS — R2681 Unsteadiness on feet: Secondary | ICD-10-CM

## 2017-05-06 DIAGNOSIS — M6281 Muscle weakness (generalized): Secondary | ICD-10-CM

## 2017-05-06 DIAGNOSIS — Z9181 History of falling: Secondary | ICD-10-CM | POA: Diagnosis not present

## 2017-05-06 NOTE — Therapy (Signed)
[place] [facility] [address], [place], [address] Phone: [phone]   Fax:  [phone]  Physical Therapy Treatment  Patient Details  Name: [name] MRN: [id number] Date of Birth: Jan 24, 1941 Referring Provider: Dr. Caryl Bis  Encounter Date: 05/06/2017      PT End of Session - 05/06/17 1426    Visit Number 44   Number of Visits 50   Date for PT Re-Evaluation 16-Jun-2017   Authorization Type g codes 29-Apr-2023   PT Start Time 1324   PT Stop Time 1430   PT Time Calculation (min) 45 min   Equipment Utilized During Treatment Gait belt   Activity Tolerance Patient tolerated treatment well   Behavior During Therapy [facility] for tasks assessed/performed      Past Medical History:  Diagnosis Date  . Breast cancer ([place]) 2001   left breast  . Cancer ([place]) 2001   left breast ca  . GERD (gastroesophageal reflux disease)   . Hyperlipidemia   . Hypertension   . Osteoarthritis, multiple sites   . Osteoarthrosis involving, or with mention of more than one site, but not specified as generalized, multiple sites   . Osteopenia   . Personal history of radiation therapy 2001   left breast ca  . Psoriasis     Past Surgical History:  Procedure Laterality Date  . ABDOMINAL HYSTERECTOMY    . ANKLE FRACTURE SURGERY  4/08   left---hardware still in place  . 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 BREAST EXCISIONAL BIOPSY Left yrs ago   benign  . BREAST LUMPECTOMY  2001   left  . CAROTID ENDARTERECTOMY Left 10/23/2004  . FRACTURE SURGERY    . SHOULDER SURGERY  6/07   left  . TONSILLECTOMY AND ADENOIDECTOMY    . TOTAL HIP ARTHROPLASTY  2004   right    There were no vitals filed for this visit.      Subjective Assessment - 05/06/17 1423    Subjective Patient reports no major changes; states she had to miss her apointment secondary to increased family difficulties.    Pertinent History Pt reports that she has  noticed worsening of her balance recently. She has had approximately 4 falls in the last 6 months. No known pattern to her falls. Pt reports that she gets in a rush and then she falls. Denies dizziness. Denies motion sensitivity. She has hardware in her L ankle from a fracture of both her tibia and fibula secondary to a fall. She notices worse balance in the AM. 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 No/denies      TREATMENT: Therapeutic Exercise: Nustep level 4 with seat level 9 -- x 44min Feet together balance on blue airex pad with ball toss - 2 x 20  Hip machine- hip abduction - 40#  2 x 15 B Standing feet apart on wobbleboard - 2 x 60sec Step ups onto a 14" step - 2 x 5 B LEs Feet together balance on half foam roller - 2 x 60sec  Tandem ambulation over airex beam - x 7 down and back on beam Heel lifts off of step - x 20 with UE support   Patient demonstrates increased LE fatigue at end of session       PT Education -  05/06/17 1426    Education provided Yes   Education Details form/technique with exercise   Person(s) Educated Patient   Methods Explanation;Demonstration   Comprehension Verbalized understanding;Returned demonstration             PT Long Term Goals - 04/19/17 1029      PT LONG TERM GOAL #1   Title Pt will be independent with HEP in order to improve strength and balance in order to decrease fall risk and improve function at home and work.    Time 8   Period Weeks   Status Achieved     PT LONG TERM GOAL #2   Title Pt will improve BERG by at least 3 points in order to demonstrate clinically significant improvement in balance.     Baseline 10/19/16: 44/56, 11/26/16 -- 47/56; 01/14/17: 49/56   Time 8   Period Weeks   Status Achieved     PT LONG TERM GOAL #3   Title Pt will be able to perform sit to  stand from regular height chair x 10 without UE support to improve functional independence when raising up from low surfaces such as commode   Baseline 10/19/16: unable to perform sit to stand without UE support -- 5/10 still unable to complete 1 without use of UEs. 01/14/2017: able to perform x17 with airex on chair 02/15/2017: Able to perform sit to stand -- x12 with pillow on standard chair 03/18/17: x18 sit to stands with pillow on a chair; 04/19/17: x20 sit to stands with pillow on the chair    Time 8   Period Weeks   Status On-going     PT LONG TERM GOAL #4   Title Pt will improve ABC by at least 13% in order to demonstrate clinically significant improvement in balance confidence.    Baseline 10/19/16: 46.88% 01/14/2017: 65%   Time 8   Period Weeks   Status Achieved     PT LONG TERM GOAL #5   Title Patient will improve SLS balance to over 10secs B to improve balance while high cabinets and decreasing fall risk.    Baseline 2 sec B; 03/18/2017: 4 sec B; 04/19/17: 6 sec   Time 6   Period Weeks   Status On-going     Additional Long Term Goals   Additional Long Term Goals Yes     PT LONG TERM GOAL #6   Title Patient will improve FGA to over 28/30 to improve ability to ambulate over varied surfaces such as grass when at home.    Baseline 25/30   Time 6   Period Weeks   Status New               Plan - 05/06/17 1427    Clinical Impression Statement Patient demonstrates improvement with balancing on wobble board requiring less use of UE support to perform exercises indicating functional carryover between treatment sessions. Patient continues to demonstrate poor hip control and weakness with hip exercises and will benefit from further skilled therapy to return to prior level of function.    Rehab Potential Good   Clinical Impairments Affecting Rehab Potential Positive: motivation; Negative: possible decreased foot sensation, age   PT Frequency 2x / week   PT Duration 8 weeks   PT  Treatment/Interventions ADLs/Self Care Home Management;Aquatic Therapy;Biofeedback;DME Instruction;Gait training;Stair training;Functional mobility training;Therapeutic activities;Therapeutic exercise;Balance training;Neuromuscular re-education;Patient/family education;Passive range of motion;Energy conservation   PT Next Visit Plan Progress balance and strengthening exercises,    PT Home Exercise Plan  modified tandem balance, sit to stand from elevated chair without UE support, standing yellow tband hip abduction, hooklying bridges   Consulted and Agree with Plan of Care Patient      Patient will benefit from skilled therapeutic intervention in order to improve the following deficits and impairments:  Abnormal gait, Pain, Decreased balance  Visit Diagnosis: Unsteadiness on feet  History of falling  Muscle weakness (generalized)     Problem List Patient Active Problem List   Diagnosis Date Noted  . Pain and swelling of lower extremity, right 03/29/2017  . Right shoulder pain 03/29/2017  . Diarrhea 05/18/2016  . Fall 05/01/2016  . Unintentional weight loss 12/30/2015  . Leg weakness, bilateral 12/30/2015  . Hyperkalemia 01/01/2015  . Occasional numbness/prickling/tingling of fingers and toes 12/18/2014  . Routine general medical examination at a health care facility 12/13/2013  . Macrocytic anemia 05/19/2013  . 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 Psoriasis   . Osteoarthritis, multiple sites   . Episodic mood disorder ([place]) 12/01/2011  . History of breast cancer 10/28/2011  . GERD (gastroesophageal reflux disease)   . Hyperlipidemia   . Hypertension   . Osteopenia   . Occlusion and stenosis of carotid artery without mention of cerebral infarction 05/27/2011    Blythe Stanford, PT DPT 05/06/2017, 2:31 PM  [facility] [address], [place], [address] Phone: [phone]   Fax:   [phone]  Name: [name] MRN: [id number] Date of Birth: [birth year]

## 2017-05-10 ENCOUNTER — Ambulatory Visit: Payer: Medicare Other

## 2017-05-10 DIAGNOSIS — R2681 Unsteadiness on feet: Secondary | ICD-10-CM

## 2017-05-10 DIAGNOSIS — M6281 Muscle weakness (generalized): Secondary | ICD-10-CM | POA: Diagnosis not present

## 2017-05-10 DIAGNOSIS — Z9181 History of falling: Secondary | ICD-10-CM

## 2017-05-10 NOTE — Therapy (Signed)
[facility] [address], [place], [address] Phone: [phone]   Fax:  [phone]  Physical Therapy Treatment  Patient Details  Name: [name] MRN: [id number] Date of Birth: Oct 04, 1940 Referring Provider: Dr. Caryl Bis  Encounter Date: 05/10/2017      PT End of Session - 05/10/17 1027    Visit Number 45   Number of Visits 50   Date for PT Re-Evaluation 05/18/2017   Authorization Type g codes 10/10   PT Start Time 1007   PT Stop Time 1045   PT Time Calculation (min) 38 min   Equipment Utilized During Treatment Gait belt   Activity Tolerance Patient tolerated treatment well   Behavior During Therapy [facility] for tasks assessed/performed      Past Medical History:  Diagnosis Date  . Breast cancer ([place]) 2001   left breast  . Cancer ([place]) 2001   left breast ca  . GERD (gastroesophageal reflux disease)   . Hyperlipidemia   . Hypertension   . Osteoarthritis, multiple sites   . Osteoarthrosis involving, or with mention of more than one site, but not specified as generalized, multiple sites   . Osteopenia   . Personal history of radiation therapy 2001   left breast ca  . Psoriasis     Past Surgical History:  Procedure Laterality Date  . ABDOMINAL HYSTERECTOMY    . ANKLE FRACTURE SURGERY  4/08   left---hardware still in place  . 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 BREAST EXCISIONAL BIOPSY Left yrs ago   benign  . BREAST LUMPECTOMY  2001   left  . CAROTID ENDARTERECTOMY Left 10/23/2004  . FRACTURE SURGERY    . SHOULDER SURGERY  6/07   left  . TONSILLECTOMY AND ADENOIDECTOMY    . TOTAL HIP ARTHROPLASTY  2004   right    There were no vitals filed for this visit.      Subjective Assessment - 05/10/17 1022    Subjective Patient reports she's been tired secondary to dealing with husband with Alzheimer's dz.    Pertinent History Pt reports that she has noticed worsening of her  balance recently. She has had approximately 4 falls in the last 6 months. No known pattern to her falls. Pt reports that she gets in a rush and then she falls. Denies dizziness. Denies motion sensitivity. She has hardware in her L ankle from a fracture of both her tibia and fibula secondary to a fall. She notices worse balance in the AM. 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 No/denies      TREATMENT: Therapeutic Exercise: Nustep level 4 with seat level 9 -- x 55min Standing feet apart on wobbleboard - 2 x 60sec; heel tapping with intermittent UE support Feet together balance on blue airex pad with ball toss - 2 x 20  Side stepping up and over airex pad - x 20 with intermittent UE support Ball toss feet together on airex pad - 2 x 15  Step ups onto a 14" step - 2 x 7 B LEs Step taps onto airex pad from R LE - 2 x 10    Patient demonstrates increased LE fatigue at end of session       PT Education - 05/10/17 1026    Education  provided Yes   Education Details form/technique with exercise   Person(s) Educated Patient   Methods Explanation;Demonstration   Comprehension Verbalized understanding;Returned demonstration             PT Long Term Goals - 05/10/17 1230      PT LONG TERM GOAL #1   Title Pt will be independent with HEP in order to improve strength and balance in order to decrease fall risk and improve function at home and work.    Time 8   Period Weeks   Status Achieved     PT LONG TERM GOAL #2   Title Pt will improve BERG by at least 3 points in order to demonstrate clinically significant improvement in balance.     Baseline 10/19/16: 44/56, 11/26/16 -- 47/56; 01/14/17: 49/56   Time 8   Period Weeks   Status Achieved     PT LONG TERM GOAL #3   Title Pt will be able to perform sit to stand from regular height  chair x 10 without UE support to improve functional independence when raising up from low surfaces such as commode   Baseline 10/19/16: unable to perform sit to stand without UE support -- 5/10 still unable to complete 1 without use of UEs. 01/14/2017: able to perform x17 with airex on chair 02/15/2017: Able to perform sit to stand -- x12 with pillow on standard chair 03/18/17: x18 sit to stands with pillow on a chair; 04/19/17: x20 sit to stands with pillow on the chair    Time 8   Period Weeks   Status On-going     PT LONG TERM GOAL #4   Title Pt will improve ABC by at least 13% in order to demonstrate clinically significant improvement in balance confidence.    Baseline 10/19/16: 46.88% 01/14/2017: 65%   Time 8   Period Weeks   Status Achieved     PT LONG TERM GOAL #5   Title Patient will improve SLS balance to over 10secs B to improve balance while high cabinets and decreasing fall risk.    Baseline 2 sec B; 03/18/2017: 4 sec B; 04/19/17: 6 sec   Time 6   Period Weeks   Status On-going     PT LONG TERM GOAL #6   Title Patient will improve FGA to over 28/30 to improve ability to ambulate over varied surfaces such as grass when at home.    Baseline 25/30   Time 6   Period Weeks   Status New               Plan - 05/10/17 1036    Clinical Impression Statement [place] continues to demonstrate decreased dynamic balance requiring UE support to perform higher level exercises such as movement on airex pad. However, patient demonstrates improvement with static balance requiring less UE support to perform standing with feet together on the wobble board indicating functional carryover between visits. Patient will benefit from further skilled therapy to return to prior level of function.    Rehab Potential Good   Clinical Impairments Affecting Rehab Potential Positive: motivation; Negative: possible decreased foot sensation, age   PT Frequency 2x / week   PT Duration 8 weeks   PT  Treatment/Interventions ADLs/Self Care Home Management;Aquatic Therapy;Biofeedback;DME Instruction;Gait training;Stair training;Functional mobility training;Therapeutic activities;Therapeutic exercise;Balance training;Neuromuscular re-education;Patient/family education;Passive range of motion;Energy conservation   PT Next Visit Plan Progress balance and strengthening exercises,    PT Home Exercise Plan modified tandem balance, sit to stand from elevated chair  without UE support, standing yellow tband hip abduction, hooklying bridges   Consulted and Agree with Plan of Care Patient      Patient will benefit from skilled therapeutic intervention in order to improve the following deficits and impairments:  Abnormal gait, Pain, Decreased balance  Visit Diagnosis: Unsteadiness on feet  History of falling  Muscle weakness (generalized)       G-Codes - 05/30/17 1233    Functional Assessment Tool Used (Outpatient Only) clinical judgement, Functional strength [id number]   Functional Limitation Mobility: Walking and moving around   Mobility: Walking and Moving Around Current Status ([id number]) At least 20 percent but less than 40 percent impaired, limited or restricted   Mobility: Walking and Moving Around Goal Status [phone]) At least 20 percent but less than 40 percent impaired, limited or restricted      Problem List Patient Active Problem List   Diagnosis Date Noted  . Pain and swelling of lower extremity, right 03/29/2017  . Right shoulder pain 03/29/2017  . Diarrhea 05/18/2016  . Fall 05/01/2016  . Unintentional weight loss 12/30/2015  . Leg weakness, bilateral 12/30/2015  . Hyperkalemia 01/01/2015  . Occasional numbness/prickling/tingling of fingers and toes 12/18/2014  . Routine general medical examination at a health care facility 12/13/2013  . Macrocytic anemia 05/19/2013  . 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 Psoriasis   . Osteoarthritis, multiple sites   . Episodic mood disorder  ([place]) 12/01/2011  . History of breast cancer 10/28/2011  . GERD (gastroesophageal reflux disease)   . Hyperlipidemia   . Hypertension   . Osteopenia   . Occlusion and stenosis of carotid artery without mention of cerebral infarction 05/27/2011    Blythe Stanford, PT DPT 05/30/2017, 12:34 PM  [facility] [address], [place], [address] Phone: [phone]   Fax:  [phone]  Name: [name] MRN: [id number] Date of Birth: [birth year]

## 2017-05-13 ENCOUNTER — Ambulatory Visit: Payer: Medicare Other

## 2017-05-13 DIAGNOSIS — M6281 Muscle weakness (generalized): Secondary | ICD-10-CM | POA: Diagnosis not present

## 2017-05-13 DIAGNOSIS — Z9181 History of falling: Secondary | ICD-10-CM | POA: Diagnosis not present

## 2017-05-13 DIAGNOSIS — R2681 Unsteadiness on feet: Secondary | ICD-10-CM | POA: Diagnosis not present

## 2017-05-13 NOTE — Therapy (Signed)
[facility] [address] [place], [address] Phone: [phone]   Fax:  [phone]  Physical Therapy Treatment  Patient Details  Name: [name] MRN: [id number] Date of Birth: 04-21-1941 Referring Provider: Dr. Caryl Bis  Encounter Date: 05/13/2017      PT End of Session - 05/13/17 1434    Visit Number 46   Number of Visits 50   Date for PT Re-Evaluation 06/08/17   Authorization Type g codes 1/10   PT Start Time 1345   PT Stop Time 1430   PT Time Calculation (min) 45 min   Equipment Utilized During Treatment Gait belt   Activity Tolerance Patient tolerated treatment well   Behavior During Therapy [facility] for tasks assessed/performed      Past Medical History:  Diagnosis Date  . Breast cancer ([place]) 2001   left breast  . Cancer ([place]) 2001   left breast ca  . GERD (gastroesophageal reflux disease)   . Hyperlipidemia   . Hypertension   . Osteoarthritis, multiple sites   . Osteoarthrosis involving, or with mention of more than one site, but not specified as generalized, multiple sites   . Osteopenia   . Personal history of radiation therapy 2001   left breast ca  . Psoriasis     Past Surgical History:  Procedure Laterality Date  . ABDOMINAL HYSTERECTOMY    . ANKLE FRACTURE SURGERY  4/08   left---hardware still in place  . 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 BREAST EXCISIONAL BIOPSY Left yrs ago   benign  . BREAST LUMPECTOMY  2001   left  . CAROTID ENDARTERECTOMY Left 10/23/2004  . FRACTURE SURGERY    . SHOULDER SURGERY  6/07   left  . TONSILLECTOMY AND ADENOIDECTOMY    . TOTAL HIP ARTHROPLASTY  2004   right    There were no vitals filed for this visit.      Subjective Assessment - 05/13/17 1402    Subjective Patient reports no major changes since the previous visit.    Pertinent History Pt reports that she has noticed worsening of her balance recently. She has had  approximately 4 falls in the last 6 months. No known pattern to her falls. Pt reports that she gets in a rush and then she falls. Denies dizziness. Denies motion sensitivity. She has hardware in her L ankle from a fracture of both her tibia and fibula secondary to a fall. She notices worse balance in the AM. 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 No/denies      TREATMENT: Therapeutic Exercise: Nustep level 4 with seat level 9 -- x 10min Step ups onto a 14" step - 2 x 7 B LEs Standing feet apart on wobbleboard sideways; and back and forth- 2 x 60sec; heel tapping with intermittent UE support Ball toss widened tandem stance - x 20 on airex B Ball toss narrow tandem stance - x20 on airex B Squats on airex pad - x 25 Hip abduction - Hip machine - 2 x 15 55# Sit to stands - x 20 with pillow on the chair    Patient demonstrates increased LE fatigue at end of session       PT Education - 05/13/17 1433    Education provided Yes  Education Details form/technique with exercise   Person(s) Educated Patient   Methods Explanation;Demonstration   Comprehension Verbalized understanding;Returned demonstration             PT Long Term Goals - 05/10/17 1230      PT LONG TERM GOAL #1   Title Pt will be independent with HEP in order to improve strength and balance in order to decrease fall risk and improve function at home and work.    Time 8   Period Weeks   Status Achieved     PT LONG TERM GOAL #2   Title Pt will improve BERG by at least 3 points in order to demonstrate clinically significant improvement in balance.     Baseline 10/19/16: 44/56, 11/26/16 -- 47/56; 01/14/17: 49/56   Time 8   Period Weeks   Status Achieved     PT LONG TERM GOAL #3   Title Pt will be able to perform sit to stand from regular height chair x 10 without  UE support to improve functional independence when raising up from low surfaces such as commode   Baseline 10/19/16: unable to perform sit to stand without UE support -- 5/10 still unable to complete 1 without use of UEs. 01/14/2017: able to perform x17 with airex on chair 02/15/2017: Able to perform sit to stand -- x12 with pillow on standard chair 03/18/17: x18 sit to stands with pillow on a chair; 04/19/17: x20 sit to stands with pillow on the chair    Time 8   Period Weeks   Status On-going     PT LONG TERM GOAL #4   Title Pt will improve ABC by at least 13% in order to demonstrate clinically significant improvement in balance confidence.    Baseline 10/19/16: 46.88% 01/14/2017: 65%   Time 8   Period Weeks   Status Achieved     PT LONG TERM GOAL #5   Title Patient will improve SLS balance to over 10secs B to improve balance while high cabinets and decreasing fall risk.    Baseline 2 sec B; 03/18/2017: 4 sec B; 04/19/17: 6 sec   Time 6   Period Weeks   Status On-going     PT LONG TERM GOAL #6   Title Patient will improve FGA to over 28/30 to improve ability to ambulate over varied surfaces such as grass when at home.    Baseline 25/30   Time 6   Period Weeks   Status New               Plan - 05/13/17 1436    Clinical Impression Statement Patient continues to demonstrate decreased quadriceps strength with exercises today requiring increased height of the chair to perform sit to stands. Patient demonstrates improvement in static balance requiring less UE support to perform exercises. Patient will benefit from further skilled therapy to return to prior level of function.     Rehab Potential Good   Clinical Impairments Affecting Rehab Potential Positive: motivation; Negative: possible decreased foot sensation, age   PT Frequency 2x / week   PT Duration 8 weeks   PT Treatment/Interventions ADLs/Self Care Home Management;Aquatic Therapy;Biofeedback;DME Instruction;Gait training;Stair  training;Functional mobility training;Therapeutic activities;Therapeutic exercise;Balance training;Neuromuscular re-education;Patient/family education;Passive range of motion;Energy conservation   PT Next Visit Plan Progress balance and strengthening exercises,    PT Home Exercise Plan modified tandem balance, sit to stand from elevated chair without UE support, standing yellow tband hip abduction, hooklying bridges   Consulted and Agree with  Plan of Care Patient      Patient will benefit from skilled therapeutic intervention in order to improve the following deficits and impairments:  Abnormal gait, Pain, Decreased balance  Visit Diagnosis: Unsteadiness on feet  History of falling  Muscle weakness (generalized)     Problem List Patient Active Problem List   Diagnosis Date Noted  . Pain and swelling of lower extremity, right 03/29/2017  . Right shoulder pain 03/29/2017  . Diarrhea 05/18/2016  . Fall 05/01/2016  . Unintentional weight loss 12/30/2015  . Leg weakness, bilateral 12/30/2015  . Hyperkalemia 01/01/2015  . Occasional numbness/prickling/tingling of fingers and toes 12/18/2014  . Routine general medical examination at a health care facility 12/13/2013  . Macrocytic anemia 05/19/2013  . 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 Psoriasis   . Osteoarthritis, multiple sites   . Episodic mood disorder ([place]) 12/01/2011  . History of breast cancer 10/28/2011  . GERD (gastroesophageal reflux disease)   . Hyperlipidemia   . Hypertension   . Osteopenia   . Occlusion and stenosis of carotid artery without mention of cerebral infarction 05/27/2011    Blythe Stanford, PT DPT 05/13/2017, 3:00 PM  [facility] [address], [place], [address] Phone: [phone]   Fax:  [phone]  Name: [name] MRN: [id number] Date of Birth: Aug 16, 1940

## 2017-05-17 ENCOUNTER — Ambulatory Visit: Payer: Medicare Other

## 2017-05-17 DIAGNOSIS — R2681 Unsteadiness on feet: Secondary | ICD-10-CM

## 2017-05-17 DIAGNOSIS — Z9181 History of falling: Secondary | ICD-10-CM

## 2017-05-17 DIAGNOSIS — M6281 Muscle weakness (generalized): Secondary | ICD-10-CM

## 2017-05-17 NOTE — Therapy (Signed)
[facility] [address] [place], [address] Phone: [phone]   Fax:  [phone]  Physical Therapy Treatment  Patient Details  Name: [name] MRN: [id number] Date of Birth: Jun 18, 1941 Referring Provider: Dr. Caryl Bis  Encounter Date: 2017/06/02      PT End of Session - 06/02/2017 1011    Visit Number 47   Number of Visits 50   Date for PT Re-Evaluation 06/02/17   Authorization Type g codes Sep 14, 2022   PT Start Time 1000   PT Stop Time 1045   PT Time Calculation (min) 45 min   Equipment Utilized During Treatment Gait belt   Activity Tolerance Patient tolerated treatment well   Behavior During Therapy [facility] for tasks assessed/performed      Past Medical History:  Diagnosis Date  . Breast cancer ([place]) 2001   left breast  . Cancer ([place]) 2001   left breast ca  . GERD (gastroesophageal reflux disease)   . Hyperlipidemia   . Hypertension   . Osteoarthritis, multiple sites   . Osteoarthrosis involving, or with mention of more than one site, but not specified as generalized, multiple sites   . Osteopenia   . Personal history of radiation therapy 2001   left breast ca  . Psoriasis     Past Surgical History:  Procedure Laterality Date  . ABDOMINAL HYSTERECTOMY    . ANKLE FRACTURE SURGERY  4/08   left---hardware still in place  . 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 BREAST EXCISIONAL BIOPSY Left yrs ago   benign  . BREAST LUMPECTOMY  2001   left  . CAROTID ENDARTERECTOMY Left 10/23/2004  . FRACTURE SURGERY    . SHOULDER SURGERY  6/07   left  . TONSILLECTOMY AND ADENOIDECTOMY    . TOTAL HIP ARTHROPLASTY  2004   right    There were no vitals filed for this visit.      Subjective Assessment - 06/02/2017 1007    Subjective Patient reports no major changes since the previous visit and states her weekend was 'lousy' reporting difficulty with managing husband's illness.   Pertinent  History Pt reports that she has noticed worsening of her balance recently. She has had approximately 4 falls in the last 6 months. No known pattern to her falls. Pt reports that she gets in a rush and then she falls. Denies dizziness. Denies motion sensitivity. She has hardware in her L ankle from a fracture of both her tibia and fibula secondary to a fall. She notices worse balance in the AM. 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 No/denies      TREATMENT: Therapeutic Exercise: Nustep level 4 with seat level 9 -- x 85min Standing feet apart on wobbleboard sideways; and back and forth- 2 x 60sec; heel tapping with intermittent UE support Walking head turns - up/down, left/right, head circles - 2 x 55ft each way  Step ups onto a 14" step - 2 x 5 B LEs [place] pattern around cones - 3 x 5 cones  Eye closed - ambulation forward - 3 x 38ft   Patient demonstrates increased LE fatigue at end of session          PT Education - 2017-06-02 1010    Education provided Yes   Education Details  form/technique with exercise   Person(s) Educated Patient   Methods Explanation;Demonstration   Comprehension Verbalized understanding;Returned demonstration             PT Long Term Goals - 05/10/17 1230      PT LONG TERM GOAL #1   Title Pt will be independent with HEP in order to improve strength and balance in order to decrease fall risk and improve function at home and work.    Time 8   Period Weeks   Status Achieved     PT LONG TERM GOAL #2   Title Pt will improve BERG by at least 3 points in order to demonstrate clinically significant improvement in balance.     Baseline 10/19/16: 44/56, 11/26/16 -- 47/56; 01/14/17: 49/56   Time 8   Period Weeks   Status Achieved     PT LONG TERM GOAL #3   Title Pt will be able to perform sit to  stand from regular height chair x 10 without UE support to improve functional independence when raising up from low surfaces such as commode   Baseline 10/19/16: unable to perform sit to stand without UE support -- 5/10 still unable to complete 1 without use of UEs. 01/14/2017: able to perform x17 with airex on chair 02/15/2017: Able to perform sit to stand -- x12 with pillow on standard chair 03/18/17: x18 sit to stands with pillow on a chair; 04/19/17: x20 sit to stands with pillow on the chair    Time 8   Period Weeks   Status On-going     PT LONG TERM GOAL #4   Title Pt will improve ABC by at least 13% in order to demonstrate clinically significant improvement in balance confidence.    Baseline 10/19/16: 46.88% 01/14/2017: 65%   Time 8   Period Weeks   Status Achieved     PT LONG TERM GOAL #5   Title Patient will improve SLS balance to over 10secs B to improve balance while high cabinets and decreasing fall risk.    Baseline 2 sec B; 03/18/2017: 4 sec B; 04/19/17: 6 sec   Time 6   Period Weeks   Status On-going     PT LONG TERM GOAL #6   Title Patient will improve FGA to over 28/30 to improve ability to ambulate over varied surfaces such as grass when at home.    Baseline 25/30   Time 6   Period Weeks   Status New               Plan - 05/17/17 1046    Clinical Impression Statement Focused on improving dynamic balance as patient continues to report balance difficulties with  waking up in the middle of the night. Patient reports she has to walk with her eyes closed at night and has difficulty with performance. Patient demonstrates poor balance without eyes closed with serious gait deviations. Patient will benefit from further skilled therapy focused on improving limitations to return to prior level of function.    Rehab Potential Good   Clinical Impairments Affecting Rehab Potential Positive: motivation; Negative: possible decreased foot sensation, age   PT Frequency 2x / week   PT  Duration 8 weeks   PT Treatment/Interventions ADLs/Self Care Home Management;Aquatic Therapy;Biofeedback;DME Instruction;Gait training;Stair training;Functional mobility training;Therapeutic activities;Therapeutic exercise;Balance training;Neuromuscular re-education;Patient/family education;Passive range of motion;Energy conservation   PT Next Visit Plan Progress balance and strengthening exercises,    PT Home Exercise Plan modified tandem balance, sit to stand from elevated  chair without UE support, standing yellow tband hip abduction, hooklying bridges   Consulted and Agree with Plan of Care Patient      Patient will benefit from skilled therapeutic intervention in order to improve the following deficits and impairments:  Abnormal gait, Pain, Decreased balance  Visit Diagnosis: Unsteadiness on feet  History of falling  Muscle weakness (generalized)     Problem List Patient Active Problem List   Diagnosis Date Noted  . Pain and swelling of lower extremity, right 03/29/2017  . Right shoulder pain 03/29/2017  . Diarrhea 05/18/2016  . Fall 05/01/2016  . Unintentional weight loss 12/30/2015  . Leg weakness, bilateral 12/30/2015  . Hyperkalemia 01/01/2015  . Occasional numbness/prickling/tingling of fingers and toes 12/18/2014  . Routine general medical examination at a health care facility 12/13/2013  . Macrocytic anemia 05/19/2013  . 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 Psoriasis   . Osteoarthritis, multiple sites   . Episodic mood disorder ([place]) 12/01/2011  . History of breast cancer 10/28/2011  . GERD (gastroesophageal reflux disease)   . Hyperlipidemia   . Hypertension   . Osteopenia   . Occlusion and stenosis of carotid artery without mention of cerebral infarction 05/27/2011    Blythe Stanford, PT DPT 05/17/2017, 10:50 AM  [facility] [address] [place], [address] Phone: [phone]    Fax:  [phone]  Name: [name] MRN: [id number] Date of Birth: December 29, 1940

## 2017-05-20 ENCOUNTER — Ambulatory Visit: Payer: Medicare Other

## 2017-05-24 ENCOUNTER — Ambulatory Visit: Payer: Medicare Other | Attending: Family Medicine

## 2017-05-24 DIAGNOSIS — Z9181 History of falling: Secondary | ICD-10-CM | POA: Insufficient documentation

## 2017-05-24 DIAGNOSIS — M25511 Pain in right shoulder: Secondary | ICD-10-CM | POA: Diagnosis not present

## 2017-05-24 DIAGNOSIS — M6281 Muscle weakness (generalized): Secondary | ICD-10-CM | POA: Diagnosis not present

## 2017-05-24 DIAGNOSIS — R2681 Unsteadiness on feet: Secondary | ICD-10-CM | POA: Insufficient documentation

## 2017-05-24 NOTE — Therapy (Signed)
[facility] [address] [place], [address] Phone: [phone]   Fax:  [phone]  Physical Therapy Treatment  Patient Details  Name: [name] MRN: [id number] Date of Birth: [birth year] Referring Provider: Dr. Caryl Bis   Encounter Date: 05/24/2017  PT End of Session - 05/24/17 1426    Visit Number  48    Number of Visits  58    Date for PT Re-Evaluation  06/21/17    Authorization Type  g codes 10/23/22    PT Start Time  2297    PT Stop Time  1427    PT Time Calculation (min)  40 min    Equipment Utilized During Treatment  Gait belt    Activity Tolerance  Patient tolerated treatment well    Behavior During Therapy  [facility] for tasks assessed/performed       Past Medical History:  Diagnosis Date  . Breast cancer ([place]) 2001   left breast  . Cancer ([place]) 2001   left breast ca  . GERD (gastroesophageal reflux disease)   . Hyperlipidemia   . Hypertension   . Osteoarthritis, multiple sites   . Osteoarthrosis involving, or with mention of more than one site, but not specified as generalized, multiple sites   . Osteopenia   . Personal history of radiation therapy 2001   left breast ca  . Psoriasis     Past Surgical History:  Procedure Laterality Date  . ABDOMINAL HYSTERECTOMY    . ANKLE FRACTURE SURGERY  4/08   left---hardware still in place  . 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 BREAST EXCISIONAL BIOPSY Left yrs ago   benign  . BREAST LUMPECTOMY  2001   left  . CAROTID ENDARTERECTOMY Left 10/23/2004  . FRACTURE SURGERY    . SHOULDER SURGERY  6/07   left  . TONSILLECTOMY AND ADENOIDECTOMY    . TOTAL HIP ARTHROPLASTY  2004   right    There were no vitals filed for this visit.  Subjective Assessment - 05/24/17 1422    Subjective  Patient reports having a difficult weekend with managing husbands symptoms over the weekend. Patient states no major changes otherwise.     Pertinent  History  Pt reports that she has noticed worsening of her balance recently. She has had approximately 4 falls in the last 6 months. No known pattern to her falls. Pt reports that she gets in a rush and then she falls. Denies dizziness. Denies motion sensitivity. She has hardware in her L ankle from a fracture of both her tibia and fibula secondary to a fall. She notices worse balance in the AM. 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  No/denies         TREATMENT: Therapeutic Exercise: Nustep level 5 with seat level 9 -- x 53min ; with use of UEs to performed Standing feet apart on wobbleboard sideways; and back and forth- 2 x 60sec; heel tapping with intermittent UE support Tandem stance ambulation over airex beam - x 7 down and back with minimal use of UEs  Eye closed - ambulation forward - 3 x 70ft Walking head turns -head circles - 2 x 58ft each way  Sit to stands from chair - x 10 with pillow on chair Step  ups onto a 14" step -  x 5 B LEs   Patient demonstrates increased LE fatigue at end of session    PT Education - 05/24/17 1425    Education provided  Yes    Education Details  form/technique with exercise    Person(s) Educated  Patient    Methods  Explanation;Demonstration    Comprehension  Verbalized understanding;Returned demonstration          PT Long Term Goals - 05/24/17 1432      PT LONG TERM GOAL #1   Title  Pt will be independent with HEP in order to improve strength and balance in order to decrease fall risk and improve function at home and work.     Time  8    Period  Weeks    Status  Achieved      PT LONG TERM GOAL #2   Title  Pt will improve BERG by at least 3 points in order to demonstrate clinically significant improvement in balance.      Baseline  10/19/16: 44/56, 11/26/16 -- 47/56; 01/14/17: 49/56     Time  8    Period  Weeks    Status  Achieved      PT LONG TERM GOAL #3   Title  Pt will be able to perform sit to stand from regular height chair x 10 without UE support to improve functional independence when raising up from low surfaces such as commode    Baseline  10/19/16: unable to perform sit to stand without UE support -- 5/10 still unable to complete 1 without use of UEs. 01/14/2017: able to perform x17 with airex on chair 02/15/2017: Able to perform sit to stand -- x12 with pillow on standard chair 03/18/17: x18 sit to stands with pillow on a chair; 04/19/17: x20 sit to stands with pillow on the chair     Time  8    Period  Weeks    Status  On-going      PT LONG TERM GOAL #4   Title  Pt will improve ABC by at least 13% in order to demonstrate clinically significant improvement in balance confidence.     Baseline  10/19/16: 46.88% 01/14/2017: 65%    Time  8    Period  Weeks    Status  Achieved      PT LONG TERM GOAL #5   Title  Patient will improve SLS balance to over 10secs B to improve balance while high cabinets and decreasing fall risk.     Baseline  2 sec B; 03/18/2017: 4 sec B; 04/19/17: 6 sec    Time  6    Period  Weeks    Status  On-going      PT LONG TERM GOAL #6   Title  Patient will improve FGA to over 28/30 to improve ability to ambulate over varied surfaces such as grass when at home.     Baseline  25/30    Time  6    Period  Weeks    Status  New            Plan - 05/24/17 1428    Clinical Impression Statement  Continued to focus on improving dynamic balanace and LE strengthening as patient continues to difficulties with these motions. Patient is making progress towards long term goals however, with improvement in DGI and sit to stand performance. However patient continues to demonstrates decreased balance most noteably in dynamic balance. Patient will benefit  from further skilled therapy to return to prior level of function.     Rehab Potential  Good     Clinical Impairments Affecting Rehab Potential  Positive: motivation; Negative: possible decreased foot sensation, age    PT Frequency  2x / week    PT Duration  8 weeks    PT Treatment/Interventions  ADLs/Self Care Home Management;Aquatic Therapy;Biofeedback;DME Instruction;Gait training;Stair training;Functional mobility training;Therapeutic activities;Therapeutic exercise;Balance training;Neuromuscular re-education;Patient/family education;Passive range of motion;Energy conservation    PT Next Visit Plan  Progress balance and strengthening exercises,     PT Home Exercise Plan  modified tandem balance, sit to stand from elevated chair without UE support, standing yellow tband hip abduction, hooklying bridges    Consulted and Agree with Plan of Care  Patient       Patient will benefit from skilled therapeutic intervention in order to improve the following deficits and impairments:  Abnormal gait, Pain, Decreased balance  Visit Diagnosis: Unsteadiness on feet  History of falling  Muscle weakness (generalized)     Problem List Patient Active Problem List   Diagnosis Date Noted  . Pain and swelling of lower extremity, right 03/29/2017  . Right shoulder pain 03/29/2017  . Diarrhea 05/18/2016  . Fall 05/01/2016  . Unintentional weight loss 12/30/2015  . Leg weakness, bilateral 12/30/2015  . Hyperkalemia 01/01/2015  . Occasional numbness/prickling/tingling of fingers and toes 12/18/2014  . Routine general medical examination at a health care facility 12/13/2013  . Macrocytic anemia 05/19/2013  . 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 Psoriasis   . Osteoarthritis, multiple sites   . Episodic mood disorder ([place]) 12/01/2011  . History of breast cancer 10/28/2011  . GERD (gastroesophageal reflux disease)   . Hyperlipidemia   . Hypertension   . Osteopenia   . Occlusion and stenosis of carotid artery without mention of cerebral infarction 05/27/2011    Blythe Stanford, PT  DPT 05/24/2017, 2:33 PM  [facility] [address], [place], [address] Phone: [phone]   Fax:  [phone]  Name: [name] MRN: [id number] Date of Birth: 10/25/1940

## 2017-05-26 ENCOUNTER — Ambulatory Visit: Payer: Medicare Other

## 2017-05-26 DIAGNOSIS — R2681 Unsteadiness on feet: Secondary | ICD-10-CM

## 2017-05-26 DIAGNOSIS — M6281 Muscle weakness (generalized): Secondary | ICD-10-CM | POA: Diagnosis not present

## 2017-05-26 DIAGNOSIS — Z9181 History of falling: Secondary | ICD-10-CM

## 2017-05-26 DIAGNOSIS — M25511 Pain in right shoulder: Secondary | ICD-10-CM | POA: Diagnosis not present

## 2017-05-26 NOTE — Therapy (Signed)
[facility] [address] [place], [address] Phone: [phone]   Fax:  [phone]  Physical Therapy Treatment  Patient Details  Name: [name] MRN: [id number] Date of Birth: 1941/06/18 Referring Provider: Dr. Caryl Bis   Encounter Date: 05/26/2017  PT End of Session - 05/26/17 0956    Visit Number  50    Number of Visits  58    Date for PT Re-Evaluation  06/21/17    Authorization Type  g codes 11-18-2022    PT Start Time  0948    PT Stop Time  1020    PT Time Calculation (min)  32 min    Equipment Utilized During Treatment  Gait belt    Activity Tolerance  Patient tolerated treatment well    Behavior During Therapy  [facility] for tasks assessed/performed       Past Medical History:  Diagnosis Date  . Breast cancer ([place]) 2001   left breast  . Cancer ([place]) 2001   left breast ca  . GERD (gastroesophageal reflux disease)   . Hyperlipidemia   . Hypertension   . Osteoarthritis, multiple sites   . Osteoarthrosis involving, or with mention of more than one site, but not specified as generalized, multiple sites   . Osteopenia   . Personal history of radiation therapy 2001   left breast ca  . Psoriasis     Past Surgical History:  Procedure Laterality Date  . ABDOMINAL HYSTERECTOMY    . ANKLE FRACTURE SURGERY  4/08   left---hardware still in place  . 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 BREAST EXCISIONAL BIOPSY Left yrs ago   benign  . BREAST LUMPECTOMY  2001   left  . CAROTID ENDARTERECTOMY Left 10/23/2004  . FRACTURE SURGERY    . SHOULDER SURGERY  6/07   left  . TONSILLECTOMY AND ADENOIDECTOMY    . TOTAL HIP ARTHROPLASTY  2004   right    There were no vitals filed for this visit.  Subjective Assessment - 05/26/17 0953    Subjective  Patient reports no major changes since the previous session. Patient reports she still afraid of falling but reports she is much stronger.     Pertinent  History  Pt reports that she has noticed worsening of her balance recently. She has had approximately 4 falls in the last 6 months. No known pattern to her falls. Pt reports that she gets in a rush and then she falls. Denies dizziness. Denies motion sensitivity. She has hardware in her L ankle from a fracture of both her tibia and fibula secondary to a fall. She notices worse balance in the AM. 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  No/denies       TREATMENT: Therapeutic Exercise: Nustep level 5 with seat level 9 -- x 63min ; with use of UEs to performed Standing feet apart on wobbleboard sideways; and back and forth- 2 x 60sec; heel tapping with intermittent UE support Hip sliders hip abduction with UE support - x 20 B  Side stepping across balance stones - x down and back x10 with 4 balance stones Tandem stance ambulation over balance stones - x 5 down and back  Heel raises in standing onto balance stones - x15  Patient demonstrates increased LE fatigue at end of session     PT Education - 05/26/17 0956    Education provided  Yes    Education Details  form/technique with exercise    Person(s) Educated  Patient    Methods  Explanation;Demonstration    Comprehension  Verbalized understanding;Returned demonstration          PT Long Term Goals - 05/24/17 1432      PT LONG TERM GOAL #1   Title  Pt will be independent with HEP in order to improve strength and balance in order to decrease fall risk and improve function at home and work.     Time  8    Period  Weeks    Status  Achieved      PT LONG TERM GOAL #2   Title  Pt will improve BERG by at least 3 points in order to demonstrate clinically significant improvement in balance.      Baseline  10/19/16: 44/56, 11/26/16 -- 47/56; 01/14/17: 49/56    Time  8    Period  Weeks     Status  Achieved      PT LONG TERM GOAL #3   Title  Pt will be able to perform sit to stand from regular height chair x 10 without UE support to improve functional independence when raising up from low surfaces such as commode    Baseline  10/19/16: unable to perform sit to stand without UE support -- 5/10 still unable to complete 1 without use of UEs. 01/14/2017: able to perform x17 with airex on chair 02/15/2017: Able to perform sit to stand -- x12 with pillow on standard chair 03/18/17: x18 sit to stands with pillow on a chair; 04/19/17: x20 sit to stands with pillow on the chair     Time  8    Period  Weeks    Status  On-going      PT LONG TERM GOAL #4   Title  Pt will improve ABC by at least 13% in order to demonstrate clinically significant improvement in balance confidence.     Baseline  10/19/16: 46.88% 01/14/2017: 65%    Time  8    Period  Weeks    Status  Achieved      PT LONG TERM GOAL #5   Title  Patient will improve SLS balance to over 10secs B to improve balance while high cabinets and decreasing fall risk.     Baseline  2 sec B; 03/18/2017: 4 sec B; 04/19/17: 6 sec    Time  6    Period  Weeks    Status  On-going      PT LONG TERM GOAL #6   Title  Patient will improve FGA to over 28/30 to improve ability to ambulate over varied surfaces such as grass when at home.     Baseline  25/30    Time  6    Period  Weeks    Status  New            Plan - 05/26/17 1011    Clinical Impression Statement  Had to end treatment session 10 min early secondary to patient's husband being at home alone. Patient demonstrates improvement with single leg stance requiring UE support after abount 5 sec of balancing. Patient demonstrates decreased confidence with single leg stance with poorer performance when stepping away from UE support. Patient will benefit from further skilled therapy to return to prior level of function.  Rehab Potential  Good    Clinical Impairments Affecting Rehab  Potential  Positive: motivation; Negative: possible decreased foot sensation, age    PT Frequency  2x / week    PT Duration  8 weeks    PT Treatment/Interventions  ADLs/Self Care Home Management;Aquatic Therapy;Biofeedback;DME Instruction;Gait training;Stair training;Functional mobility training;Therapeutic activities;Therapeutic exercise;Balance training;Neuromuscular re-education;Patient/family education;Passive range of motion;Energy conservation    PT Next Visit Plan  Progress balance and strengthening exercises,     PT Home Exercise Plan  modified tandem balance, sit to stand from elevated chair without UE support, standing yellow tband hip abduction, hooklying bridges    Consulted and Agree with Plan of Care  Patient       Patient will benefit from skilled therapeutic intervention in order to improve the following deficits and impairments:  Abnormal gait, Pain, Decreased balance  Visit Diagnosis: Muscle weakness (generalized)  History of falling  Unsteadiness on feet     Problem List Patient Active Problem List   Diagnosis Date Noted  . Pain and swelling of lower extremity, right 03/29/2017  . Right shoulder pain 03/29/2017  . Diarrhea 05/18/2016  . Fall 05/01/2016  . Unintentional weight loss 12/30/2015  . Leg weakness, bilateral 12/30/2015  . Hyperkalemia 01/01/2015  . Occasional numbness/prickling/tingling of fingers and toes 12/18/2014  . Routine general medical examination at a health care facility 12/13/2013  . Macrocytic anemia 05/19/2013  . 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 Psoriasis   . Osteoarthritis, multiple sites   . Episodic mood disorder ([place]) 12/01/2011  . History of breast cancer 10/28/2011  . GERD (gastroesophageal reflux disease)   . Hyperlipidemia   . Hypertension   . Osteopenia   . Occlusion and stenosis of carotid artery without mention of cerebral infarction 05/27/2011    Blythe Stanford, PT DPT 05/26/2017, 10:26 AM  [facility] [address], [place], [address] Phone: [phone]   Fax:  [phone]  Name: [name] MRN: [id number] Date of Birth: Mar 02, 1941

## 2017-06-02 ENCOUNTER — Ambulatory Visit: Payer: Medicare Other

## 2017-06-02 DIAGNOSIS — R2681 Unsteadiness on feet: Secondary | ICD-10-CM | POA: Diagnosis not present

## 2017-06-02 DIAGNOSIS — Z9181 History of falling: Secondary | ICD-10-CM

## 2017-06-02 DIAGNOSIS — M6281 Muscle weakness (generalized): Secondary | ICD-10-CM

## 2017-06-02 DIAGNOSIS — M25511 Pain in right shoulder: Secondary | ICD-10-CM | POA: Diagnosis not present

## 2017-06-02 NOTE — Therapy (Signed)
[facility] [address], [place], [address] Phone: [phone]   Fax:  [phone]  Physical Therapy Evaluation  Patient Details  Name: [name] MRN: [id number] Date of Birth: [birth year] Referring Provider: Dr. Caryl Bis   Encounter Date: 06/02/2017  PT End of Session - 06/02/17 1021    Visit Number  50    Number of Visits  58    Date for PT Re-Evaluation  06/21/17    Authorization Type  g codes 5/10    PT Start Time  1000    PT Stop Time  1045    PT Time Calculation (min)  45 min    Equipment Utilized During Treatment  Gait belt    Activity Tolerance  Patient tolerated treatment well    Behavior During Therapy  [facility] for tasks assessed/performed       Past Medical History:  Diagnosis Date  . Breast cancer ([place]) 2001   left breast  . Cancer ([place]) 2001   left breast ca  . GERD (gastroesophageal reflux disease)   . Hyperlipidemia   . Hypertension   . Osteoarthritis, multiple sites   . Osteoarthrosis involving, or with mention of more than one site, but not specified as generalized, multiple sites   . Osteopenia   . Personal history of radiation therapy 2001   left breast ca  . Psoriasis     Past Surgical History:  Procedure Laterality Date  . ABDOMINAL HYSTERECTOMY    . ANKLE FRACTURE SURGERY  4/08   left---hardware still in place  . 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 BREAST EXCISIONAL BIOPSY Left yrs ago   benign  . BREAST LUMPECTOMY  2001   left  . CAROTID ENDARTERECTOMY Left 10/23/2004  . FRACTURE SURGERY    . SHOULDER SURGERY  6/07   left  . TONSILLECTOMY AND ADENOIDECTOMY    . TOTAL HIP ARTHROPLASTY  2004   right    There were no vitals filed for this visit.   Subjective Assessment - 06/02/17 1017    Subjective  Patient reports she continues to have balancing difficulties most notably when walking and turning directions. Patient reports she continues to have a  fear of falling.    Pertinent History  Pt reports that she has noticed worsening of her balance recently. She has had approximately 4 falls in the last 6 months. No known pattern to her falls. Pt reports that she gets in a rush and then she falls. Denies dizziness. Denies motion sensitivity. She has hardware in her L ankle from a fracture of both her tibia and fibula secondary to a fall. She notices worse balance in the AM. 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  No/denies       Objective measurements completed on examination: See above findings.    TREATMENT: Therapeutic Exercise: Nustep level 5 with seat level 9 -- x 36min ; with use of UEs to performed Standing feet apart on wobbleboard sideways; and back and forth- 2 x 60sec; heel tapping with intermittent UE support Hip machine hip abduction - 2 x 20 40# Squats in standing on airex pad - x 20  Leg Press - quadriceps activation - 2 x 20 45# TRX single leg stance for balance - 30sec  x 2  Marches in standing on balance stones - x 20 with UE support   Patient demonstrates increased LE fatigue at end of session    PT Education - 06/02/17 1020    Education provided  Yes    Education Details  form/technique with exercise    Person(s) Educated  Patient    Methods  Explanation;Demonstration    Comprehension  Verbalized understanding;Returned demonstration          PT Long Term Goals - 05/24/17 1432      PT LONG TERM GOAL #1   Title  Pt will be independent with HEP in order to improve strength and balance in order to decrease fall risk and improve function at home and work.     Time  8    Period  Weeks    Status  Achieved      PT LONG TERM GOAL #2   Title  Pt will improve BERG by at least 3 points in order to demonstrate clinically significant improvement in balance.       Baseline  10/19/16: 44/56, 11/26/16 -- 47/56; 01/14/17: 49/56    Time  8    Period  Weeks    Status  Achieved      PT LONG TERM GOAL #3   Title  Pt will be able to perform sit to stand from regular height chair x 10 without UE support to improve functional independence when raising up from low surfaces such as commode    Baseline  10/19/16: unable to perform sit to stand without UE support -- 5/10 still unable to complete 1 without use of UEs. 01/14/2017: able to perform x17 with airex on chair 02/15/2017: Able to perform sit to stand -- x12 with pillow on standard chair 03/18/17: x18 sit to stands with pillow on a chair; 04/19/17: x20 sit to stands with pillow on the chair     Time  8    Period  Weeks    Status  On-going      PT LONG TERM GOAL #4   Title  Pt will improve ABC by at least 13% in order to demonstrate clinically significant improvement in balance confidence.     Baseline  10/19/16: 46.88% 01/14/2017: 65%    Time  8    Period  Weeks    Status  Achieved      PT LONG TERM GOAL #5   Title  Patient will improve SLS balance to over 10secs B to improve balance while high cabinets and decreasing fall risk.     Baseline  2 sec B; 03/18/2017: 4 sec B; 04/19/17: 6 sec    Time  6    Period  Weeks    Status  On-going      PT LONG TERM GOAL #6   Title  Patient will improve FGA to over 28/30 to improve ability to ambulate over varied surfaces such as grass when at home.     Baseline  25/30    Time  6    Period  Weeks    Status  New             Plan - 06/02/17 1040    Clinical Impression Statement  Patient demonstrates difficulty with performing single leg stance and continues to demonstrate increased weakeness along her LEs. Patient requires UE support to perform balancing activites indicating decreased ability with narrow BOS and patient will benefit from further skilled therapy to return to prior level of function.  Rehab Potential  Good    Clinical Impairments Affecting Rehab  Potential  Positive: motivation; Negative: possible decreased foot sensation, age    PT Frequency  2x / week    PT Duration  8 weeks    PT Treatment/Interventions  ADLs/Self Care Home Management;Aquatic Therapy;Biofeedback;DME Instruction;Gait training;Stair training;Functional mobility training;Therapeutic activities;Therapeutic exercise;Balance training;Neuromuscular re-education;Patient/family education;Passive range of motion;Energy conservation    PT Next Visit Plan  Progress balance and strengthening exercises,     PT Home Exercise Plan  modified tandem balance, sit to stand from elevated chair without UE support, standing yellow tband hip abduction, hooklying bridges    Consulted and Agree with Plan of Care  Patient       Patient will benefit from skilled therapeutic intervention in order to improve the following deficits and impairments:  Abnormal gait, Pain, Decreased balance  Visit Diagnosis: History of falling  Unsteadiness on feet  Muscle weakness (generalized)     Problem List Patient Active Problem List   Diagnosis Date Noted  . Pain and swelling of lower extremity, right 03/29/2017  . Right shoulder pain 03/29/2017  . Diarrhea 05/18/2016  . Fall 05/01/2016  . Unintentional weight loss 12/30/2015  . Leg weakness, bilateral 12/30/2015  . Hyperkalemia 01/01/2015  . Occasional numbness/prickling/tingling of fingers and toes 12/18/2014  . Routine general medical examination at a health care facility 12/13/2013  . Macrocytic anemia 05/19/2013  . 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 Psoriasis   . Osteoarthritis, multiple sites   . Episodic mood disorder ([place]) 12/01/2011  . History of breast cancer 10/28/2011  . GERD (gastroesophageal reflux disease)   . Hyperlipidemia   . Hypertension   . Osteopenia   . Occlusion and stenosis of carotid artery without mention of cerebral infarction 05/27/2011    Blythe Stanford, PT DPT 06/02/2017, 10:46 AM  [facility] [address] [place], [address] Phone: [phone]   Fax:  [phone]  Name: [name] MRN: [id number] Date of Birth: January 03, 1941

## 2017-06-03 ENCOUNTER — Ambulatory Visit: Payer: Medicare Other

## 2017-06-03 DIAGNOSIS — M25511 Pain in right shoulder: Secondary | ICD-10-CM | POA: Diagnosis not present

## 2017-06-03 DIAGNOSIS — M6281 Muscle weakness (generalized): Secondary | ICD-10-CM | POA: Diagnosis not present

## 2017-06-03 DIAGNOSIS — G8929 Other chronic pain: Secondary | ICD-10-CM | POA: Diagnosis not present

## 2017-06-03 DIAGNOSIS — Z96641 Presence of right artificial hip joint: Secondary | ICD-10-CM | POA: Diagnosis not present

## 2017-06-03 DIAGNOSIS — Z9181 History of falling: Secondary | ICD-10-CM | POA: Diagnosis not present

## 2017-06-03 DIAGNOSIS — R2681 Unsteadiness on feet: Secondary | ICD-10-CM | POA: Diagnosis not present

## 2017-06-03 DIAGNOSIS — M7541 Impingement syndrome of right shoulder: Secondary | ICD-10-CM | POA: Diagnosis not present

## 2017-06-03 NOTE — Therapy (Signed)
[facility] [address], [place], [address] Phone: [phone]   Fax:  [phone]  Physical Therapy Evaluation  Patient Details  Name: [name] MRN: [id number] Date of Birth: Mar 08, 1941 Referring Provider: Dr. Marry Guan   Encounter Date: 06/03/2017  PT End of Session - 06/03/17 1541    Visit Number  1    Number of Visits  13    Date for PT Re-Evaluation  07/15/17    Authorization Type  g codes 5/10    PT Start Time  1430    PT Stop Time  1515    PT Time Calculation (min)  45 min    Equipment Utilized During Treatment  Gait belt    Activity Tolerance  Patient tolerated treatment well    Behavior During Therapy  [facility] for tasks assessed/performed       Past Medical History:  Diagnosis Date  . Breast cancer ([place]) 2001   left breast  . Cancer ([place]) 2001   left breast ca  . GERD (gastroesophageal reflux disease)   . Hyperlipidemia   . Hypertension   . Osteoarthritis, multiple sites   . Osteoarthrosis involving, or with mention of more than one site, but not specified as generalized, multiple sites   . Osteopenia   . Personal history of radiation therapy 2001   left breast ca  . Psoriasis     Past Surgical History:  Procedure Laterality Date  . ABDOMINAL HYSTERECTOMY    . ANKLE FRACTURE SURGERY  4/08   left---hardware still in place  . 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 BREAST EXCISIONAL BIOPSY Left yrs ago   benign  . BREAST LUMPECTOMY  2001   left  . CAROTID ENDARTERECTOMY Left 10/23/2004  . FRACTURE SURGERY    . SHOULDER SURGERY  6/07   left  . TONSILLECTOMY AND ADENOIDECTOMY    . TOTAL HIP ARTHROPLASTY  2004   right    There were no vitals filed for this visit.   Subjective Assessment - 06/03/17 1447    Subjective  Patient reports increased R shoulder pain starting 6 months prior from insidious onsetlif. Patient reports increased pain with lifting her arm over head,  turning a key in an ignition of a car, lifting items from the floor(most notebaly when dressing her and her husband) , and lowering from the overhead position. Patient reports without movement, she does not have pain without movement of her shoulder. Patient reports minor radiating pain running down the anterior aspect of the arm and momentarily into the cervical region. Patient reports no current neck pain. Patient reports she has a headache ~ 2 x week. 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 Patient reports increased shoulder pain along the shoulder joint.     Pertinent History  Patient has been being seen for balance and strengthening of her LEs. Patient reports no falls in the past six months. Patient reports pain has not improved for the past month and has been about the same since the onset of pain.     Patient Stated Goals  Improve shoulder pain    Currently in Pain?  Yes    Pain Score  0-No pain 5/10 Worst; best 0/10    Pain Location  Shoulder    Pain Orientation  Right    Pain Descriptors / Indicators  Aching  Pain Type  Chronic pain    Pain Onset  More than a month ago    Pain Frequency  Intermittent         [facility] PT Assessment - 06/03/17 1445      Assessment   Medical Diagnosis  R shoulder pain    Referring Provider  Dr. Marry Guan    Onset Date/Surgical Date  11/17/16    Hand Dominance  Right    Next MD Visit  07/01/2017    Prior Therapy  yes      Balance Screen   Has the patient fallen in the past 6 months  No    Has the patient had a decrease in activity level because of a fear of falling?   No    Is the patient reluctant to leave their home because of a fear of falling?   No      Prior Function   Level of Independence  Independent    Vocation  Retired    Tree surgeon, reading, play games       Objective measurements completed on examination: See  above findings.   TREATMENT: Therapeutic Exercise Hooklying shoulder flexion AAROM -- x15 Behind the back Scapular retraction -- x15  Patient demonstrates no increase in pain throughout therapy session      PT Education - 06/03/17 1539    Education provided  Yes    Education Details  HEP: shoulder flexion in hooklying AAROM; behind the back scapular retraction    Person(s) Educated  Patient    Methods  Explanation;Demonstration;Handout    Comprehension  Verbalized understanding;Returned demonstration          PT Long Term Goals - 06/03/17 1545      PT LONG TERM GOAL #1   Title  Patient will be independent with HEP to continue benefits of therapy after discharge.     Baseline  Dependent with form and technique     Time  6    Period  Weeks    Status  New    Target Date  07/01/17      PT LONG TERM GOAL #2   Title  Patient will have full pain free shoulder flexion AROM to be able to lifts items on a high cabinet without pain/difficulty.     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     Baseline  Increased pain when dressing her husband    Time  6    Period  Weeks    Status  New    Target Date  07/15/17             Plan - 06/03/17 1542    Clinical Impression Statement  Patient is a 76 yo right hand dominant female presenting with increased R shoulder pain from insidious onset 6 months prior. Patient demonstrates R shoulder dysfunction with possible impingement and supraspinatus involvement. Patient also demonstrates decreased muscular endurance, strength, and coordination and patient will benefit from further skilled therapy tor return to prior  level of funciton.     Rehab Potential  Good    Clinical Impairments Affecting Rehab Potential  Positive: motivation; Negative: possible decreased foot sensation, age    PT Frequency  2x / week    PT Duration  6 weeks    PT Treatment/Interventions  ADLs/Self Care Home Management;Aquatic Therapy;Biofeedback;DME Instruction;Gait training;Stair training;Functional mobility training;Therapeutic activities;Therapeutic exercise;Balance training;Neuromuscular re-education;Patient/family education;Passive range of motion;Energy conservation;Ultrasound;Moist Heat;Iontophoresis 4mg /ml Dexamethasone;Cryotherapy;Electrical Stimulation;Manual techniques;Dry needling    PT Next Visit Plan  Progress balance and strengthening exercises,     PT Home Exercise Plan  See education section    Consulted and Agree with Plan of Care  Patient       Patient will benefit from skilled therapeutic intervention in order to improve the following deficits and impairments:  Pain, Increased fascial restricitons, Decreased coordination, Decreased mobility, Increased muscle spasms, Decreased activity tolerance, Decreased endurance, Decreased range of motion, Decreased strength, Hypomobility, Postural dysfunction  Visit Diagnosis: Acute pain of right shoulder - Plan: PT plan of care cert/re-cert  Muscle weakness (generalized) - Plan: PT plan of care cert/re-cert  G-Codes - 93/57/01 1549    Functional Assessment Tool Used (Outpatient Only)  clinical judgement, MMT, painful arc, AROM    Functional Limitation  Changing and maintaining body position    Mobility: Walking and Moving Around Current Status (X7939)  --    Mobility: Walking and Moving Around Goal Status (Q3009)  --    Changing and Maintaining Body Position Current Status (Q3300)  At least 40 percent but less than 60 percent impaired, limited or restricted    Changing and Maintaining Body Position Goal Status (T6226)  At least 1 percent but less than 20 percent impaired,  limited or restricted        Problem List Patient Active Problem List   Diagnosis Date Noted  . Pain and swelling of lower extremity, right 03/29/2017  . Right shoulder pain 03/29/2017  . Diarrhea 05/18/2016  . Fall 05/01/2016  . Unintentional weight loss 12/30/2015  . Leg weakness, bilateral 12/30/2015  . Hyperkalemia 01/01/2015  . Occasional numbness/prickling/tingling of fingers and toes 12/18/2014  . Routine general medical examination at a health care facility 12/13/2013  . Macrocytic anemia 05/19/2013  . 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 Psoriasis   . Osteoarthritis, multiple sites   . Episodic mood disorder ([place]) 12/01/2011  . History of breast cancer 10/28/2011  . GERD (gastroesophageal reflux disease)   . Hyperlipidemia   . Hypertension   . Osteopenia   . Occlusion and stenosis of carotid artery without mention of cerebral infarction 05/27/2011    Blythe Stanford, PT DPT 06/03/2017, 3:52 PM  [facility] [address], [place], [address] Phone: [phone]   Fax:  [phone]  Name: [name] MRN: [id number] Date of Birth: 10-Nov-1940

## 2017-06-07 ENCOUNTER — Ambulatory Visit: Payer: Medicare Other

## 2017-06-07 DIAGNOSIS — M25511 Pain in right shoulder: Secondary | ICD-10-CM | POA: Diagnosis not present

## 2017-06-07 DIAGNOSIS — M6281 Muscle weakness (generalized): Secondary | ICD-10-CM

## 2017-06-07 DIAGNOSIS — Z9181 History of falling: Secondary | ICD-10-CM | POA: Diagnosis not present

## 2017-06-07 DIAGNOSIS — R2681 Unsteadiness on feet: Secondary | ICD-10-CM | POA: Diagnosis not present

## 2017-06-07 NOTE — Therapy (Signed)
[facility] [address] [place], [address] Phone: [phone]   Fax:  [phone]  Physical Therapy Treatment  Patient Details  Name: [name] MRN: [id number] Date of Birth: 09-13-1940 Referring Provider: Dr. Marry Guan   Encounter Date: 06/07/2017  PT End of Session - 06/07/17 1113    Visit Number  2    Number of Visits  13    Date for PT Re-Evaluation  07/15/17    Authorization Type  g codes 09/14/2022    PT Start Time  11/01/30    PT Stop Time  1100    PT Time Calculation (min)  28 min    Equipment Utilized During Treatment  Gait belt    Activity Tolerance  Patient tolerated treatment well    Behavior During Therapy  [facility] for tasks assessed/performed       Past Medical History:  Diagnosis Date  . Breast cancer ([place]) Nov 01, 1999   left breast  . Cancer ([place]) 1999-11-01   left breast ca  . GERD (gastroesophageal reflux disease)   . Hyperlipidemia   . Hypertension   . Osteoarthritis, multiple sites   . Osteoarthrosis involving, or with mention of more than one site, but not specified as generalized, multiple sites   . Osteopenia   . Personal history of radiation therapy 11/01/1999   left breast ca  . Psoriasis     Past Surgical History:  Procedure Laterality Date  . ABDOMINAL HYSTERECTOMY    . ANKLE FRACTURE SURGERY  4/08   left---hardware still in place  . BREAST BIOPSY Left November 01, 1999   breast ca  . BREAST EXCISIONAL BIOPSY Left yrs ago   benign  . BREAST LUMPECTOMY  11-01-99   left  . CAROTID ENDARTERECTOMY Left 10/23/2004  . FRACTURE SURGERY    . SHOULDER SURGERY  6/07   left  . TONSILLECTOMY AND ADENOIDECTOMY    . TOTAL HIP ARTHROPLASTY  2002-11-01   right    There were no vitals filed for this visit.  Subjective Assessment - 06/07/17 1043    Subjective  Patient reports her shouder has been feeling better since the previous treatment session. Patient reports no pain at rest but a 3/10 pain when lifting her arm  overhead.    Pertinent History  Patient has been being seen for balance and strengthening of her LEs. Patient reports no falls in the past six months. Patient reports pain has not improved for the past month and has been about the same since the onset of pain.     Patient Stated Goals  Improve shoulder pain    Currently in Pain?  No/denies    Pain Onset  More than a month ago          TREATMENT Therapeutic Exercise Standing scapular retractions at [place] -x 20 10# AAROM in standing with other UE support  -- x 20  UE ranger ER/IR and flexion standing - x 20 for each movement Standing scapular retraction without resistance - x20 with arms behind back  Standing straight arm push downs at [place] - x 10 10# Patient reports increase in fatigue at end of session.     PT Education - 06/07/17 1107    Education provided  Yes    Education Details  HEP shoulder flexion with AAROM    Person(s) Educated  Patient    Methods  Explanation;Demonstration    Comprehension  Verbalized understanding;Returned demonstration  PT Long Term Goals - 06/03/17 1545      PT LONG TERM GOAL #1   Title  Patient will be independent with HEP to continue benefits of therapy after discharge.     Baseline  Dependent with form and technique     Time  6    Period  Weeks    Status  New    Target Date  07/01/17      PT LONG TERM GOAL #2   Title  Patient will have full pain free shoulder flexion AROM to be able to lifts items on a high cabinet without pain/difficulty.     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     Baseline  Increased pain when dressing her husband    Time  6     Period  Weeks    Status  New    Target Date  07/15/17            Plan - 06/07/17 1114    Clinical Impression Statement  Patient demonstrates improvement in pain when raising the shoulder overhead compared to the previous visits. Patient demonstrates less painful arc symptoms and has a lowered amount of pain throughout a fuller AROM indicating shoulder muscular weakness and poor motor control. Patient will benefit from further skilled therapy focused on improving limitations to return to prior level of function.      Rehab Potential  Good    Clinical Impairments Affecting Rehab Potential  Positive: motivation; Negative: possible decreased foot sensation, age    PT Frequency  2x / week    PT Duration  6 weeks    PT Treatment/Interventions  ADLs/Self Care Home Management;Aquatic Therapy;Biofeedback;DME Instruction;Gait training;Stair training;Functional mobility training;Therapeutic activities;Therapeutic exercise;Balance training;Neuromuscular re-education;Patient/family education;Passive range of motion;Energy conservation;Ultrasound;Moist Heat;Iontophoresis 4mg /ml Dexamethasone;Cryotherapy;Electrical Stimulation;Manual techniques;Dry needling    PT Next Visit Plan  Progress balance and strengthening exercises,     PT Home Exercise Plan  See education section    Consulted and Agree with Plan of Care  Patient       Patient will benefit from skilled therapeutic intervention in order to improve the following deficits and impairments:  Pain, Increased fascial restricitons, Decreased coordination, Decreased mobility, Increased muscle spasms, Decreased activity tolerance, Decreased endurance, Decreased range of motion, Decreased strength, Hypomobility, Postural dysfunction  Visit Diagnosis: Acute pain of right shoulder  Muscle weakness (generalized)     Problem List Patient Active Problem List   Diagnosis Date Noted  . Pain and swelling of lower extremity, right 03/29/2017  . Right  shoulder pain 03/29/2017  . Diarrhea 05/18/2016  . Fall 05/01/2016  . Unintentional weight loss 12/30/2015  . Leg weakness, bilateral 12/30/2015  . Hyperkalemia 01/01/2015  . Occasional numbness/prickling/tingling of fingers and toes 12/18/2014  . Routine general medical examination at a health care facility 12/13/2013  . Macrocytic anemia 05/19/2013  . 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 Psoriasis   . Osteoarthritis, multiple sites   . Episodic mood disorder ([place]) 12/01/2011  . History of breast cancer 10/28/2011  . GERD (gastroesophageal reflux disease)   .  Hyperlipidemia   . Hypertension   . Osteopenia   . Occlusion and stenosis of carotid artery without mention of cerebral infarction 05/27/2011    Blythe Stanford, PT DPT 06/07/2017, 11:17 AM  [facility] [address], [place], [address] Phone: [phone]   Fax:  [phone]  Name: [name] MRN: [id number] Date of Birth: Apr 05, 1941

## 2017-06-09 ENCOUNTER — Ambulatory Visit: Payer: Medicare Other

## 2017-06-09 DIAGNOSIS — Z9181 History of falling: Secondary | ICD-10-CM | POA: Diagnosis not present

## 2017-06-09 DIAGNOSIS — M25511 Pain in right shoulder: Secondary | ICD-10-CM

## 2017-06-09 DIAGNOSIS — M6281 Muscle weakness (generalized): Secondary | ICD-10-CM | POA: Diagnosis not present

## 2017-06-09 DIAGNOSIS — R2681 Unsteadiness on feet: Secondary | ICD-10-CM | POA: Diagnosis not present

## 2017-06-09 NOTE — Therapy (Signed)
[facility] [address] [place], [address] Phone: [phone]   Fax:  [phone]  Physical Therapy Treatment  Patient Details  Name: [name] MRN: [id number] Date of Birth: 1940-10-30 Referring Provider: Dr. Marry Guan   Encounter Date: 06/09/2017  PT End of Session - 06/09/17 1003    Visit Number  3    Number of Visits  13    Date for PT Re-Evaluation  07/15/17    Authorization Type  g codes 2022/10/02    PT Start Time  0945    PT Stop Time  1030    PT Time Calculation (min)  45 min    Equipment Utilized During Treatment  Gait belt    Activity Tolerance  Patient tolerated treatment well    Behavior During Therapy  [facility] for tasks assessed/performed       Past Medical History:  Diagnosis Date  . Breast cancer ([place]) 2001   left breast  . Cancer ([place]) 2001   left breast ca  . GERD (gastroesophageal reflux disease)   . Hyperlipidemia   . Hypertension   . Osteoarthritis, multiple sites   . Osteoarthrosis involving, or with mention of more than one site, but not specified as generalized, multiple sites   . Osteopenia   . Personal history of radiation therapy 2001   left breast ca  . Psoriasis     Past Surgical History:  Procedure Laterality Date  . ABDOMINAL HYSTERECTOMY    . ANKLE FRACTURE SURGERY  4/08   left---hardware still in place  . 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 BREAST EXCISIONAL BIOPSY Left yrs ago   benign  . BREAST LUMPECTOMY  2001   left  . CAROTID ENDARTERECTOMY Left 10/23/2004  . FRACTURE SURGERY    . SHOULDER SURGERY  6/07   left  . TONSILLECTOMY AND ADENOIDECTOMY    . TOTAL HIP ARTHROPLASTY  2004   right    There were no vitals filed for this visit.  Subjective Assessment - 06/09/17 0956    Subjective  Patient reports she aggrvated her shoulder yeserday when lifting her dog up. Patient reports a resting pain of 1/10.     Pertinent History  Patient has been  being seen for balance and strengthening of her LEs. Patient reports no falls in the past six months. Patient reports pain has not improved for the past month and has been about the same since the onset of pain.     Patient Stated Goals  Improve shoulder pain    Currently in Pain?  No/denies    Pain Onset  More than a month ago       TREATMENT Therapeutic Exercise Sitting scapular retractions at [place] -x 20 10# AAROM in standing with other UE support  -- 2 x 10 Standing ER with YTB - x 20  Seated High Row at [facility] - x 20 15# Low Row in standing at [place] - x 20 5# UE ranger ER/IR and flexion standing - x 20 for each movement Behind the back with 4# weight - x 20  Serratus punches with YTB - x 20   Patient reports increase in fatigue at end of session.    PT Education - 06/09/17 1002    Education provided  Yes    Education Details  Form/technique with exercise    Person(s) Educated  Patient    Methods  Explanation;Demonstration    Comprehension  Verbalized  understanding;Returned demonstration          PT Long Term Goals - 06/03/17 1545      PT LONG TERM GOAL #1   Title  Patient will be independent with HEP to continue benefits of therapy after discharge.     Baseline  Dependent with form and technique     Time  6    Period  Weeks    Status  New    Target Date  07/01/17      PT LONG TERM GOAL #2   Title  Patient will have full pain free shoulder flexion AROM to be able to lifts items on a high cabinet without pain/difficulty.     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     Baseline  Increased pain when dressing her husband    Time  6    Period   Weeks    Status  New    Target Date  07/15/17            Plan - 06/09/17 1018    Clinical Impression Statement  Patient demonstrates improvement in pain and movement compared to previous visits with ability to perform higher level exercises without increase in pain indicating functional carryover between sessions. Although patient is improving, she continues to demonstrate increased pain and muscular spasms with performing overhead motions. Patient will benefit from further skilled therapy to return to prior level  of function.      Rehab Potential  Good    Clinical Impairments Affecting Rehab Potential  Positive: motivation; Negative: possible decreased foot sensation, age    PT Frequency  2x / week    PT Duration  6 weeks    PT Treatment/Interventions  ADLs/Self Care Home Management;Aquatic Therapy;Biofeedback;DME Instruction;Gait training;Stair training;Functional mobility training;Therapeutic activities;Therapeutic exercise;Balance training;Neuromuscular re-education;Patient/family education;Passive range of motion;Energy conservation;Ultrasound;Moist Heat;Iontophoresis 4mg /ml Dexamethasone;Cryotherapy;Electrical Stimulation;Manual techniques;Dry needling    PT Next Visit Plan  Progress balance and strengthening exercises,     PT Home Exercise Plan  See education section    Consulted and Agree with Plan of Care  Patient       Patient will benefit from skilled therapeutic intervention in order to improve the following deficits and impairments:  Pain, Increased fascial restricitons, Decreased coordination, Decreased mobility, Increased muscle spasms, Decreased activity tolerance, Decreased endurance, Decreased range of motion, Decreased strength, Hypomobility, Postural dysfunction  Visit Diagnosis: Acute pain of right shoulder  Muscle weakness (generalized)     Problem List Patient Active Problem List   Diagnosis Date Noted  . Pain and swelling of lower extremity, right  03/29/2017  . Right shoulder pain 03/29/2017  . Diarrhea 05/18/2016  . Fall 05/01/2016  . Unintentional weight loss 12/30/2015  . Leg weakness, bilateral 12/30/2015  . Hyperkalemia 01/01/2015  . Occasional numbness/prickling/tingling of fingers and toes 12/18/2014  . Routine general medical examination at a health care facility 12/13/2013  . Macrocytic anemia 05/19/2013  . 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 Psoriasis   . Osteoarthritis, multiple sites   . Episodic mood disorder ([place]) 12/01/2011  .  History of breast cancer 10/28/2011  . GERD (gastroesophageal reflux disease)   . Hyperlipidemia   . Hypertension   . Osteopenia   . Occlusion and stenosis of carotid artery without mention of cerebral infarction 05/27/2011    Blythe Stanford 06/09/2017, 12:24 PM  [facility] [address], [place], [address] Phone: [phone]   Fax:  [phone]  Name: NIMA BAMBURG MRN: [id number] Date of Birth: May 06, 1941

## 2017-06-14 ENCOUNTER — Ambulatory Visit: Payer: Medicare Other

## 2017-06-14 DIAGNOSIS — M25511 Pain in right shoulder: Secondary | ICD-10-CM

## 2017-06-14 DIAGNOSIS — R2681 Unsteadiness on feet: Secondary | ICD-10-CM

## 2017-06-14 DIAGNOSIS — M6281 Muscle weakness (generalized): Secondary | ICD-10-CM

## 2017-06-14 DIAGNOSIS — Z9181 History of falling: Secondary | ICD-10-CM | POA: Diagnosis not present

## 2017-06-14 NOTE — Therapy (Signed)
[facility] [address] [place], [address] Phone: [phone]   Fax:  [phone]  Physical Therapy Treatment  Patient Details  Name: [name] MRN: [id number] Date of Birth: 07-Aug-1940 Referring Provider: Dr. Marry Guan   Encounter Date: 06/14/2017  PT End of Session - 06/14/17 1420    Visit Number  4    Number of Visits  13    Date for PT Re-Evaluation  07/15/17    Authorization Type  g codes 10-31-2022    PT Start Time  3716    PT Stop Time  1430    PT Time Calculation (min)  45 min    Equipment Utilized During Treatment  Gait belt    Activity Tolerance  Patient tolerated treatment well    Behavior During Therapy  [facility] for tasks assessed/performed       Past Medical History:  Diagnosis Date  . Breast cancer ([place]) 2001   left breast  . Cancer ([place]) 2001   left breast ca  . GERD (gastroesophageal reflux disease)   . Hyperlipidemia   . Hypertension   . Osteoarthritis, multiple sites   . Osteoarthrosis involving, or with mention of more than one site, but not specified as generalized, multiple sites   . Osteopenia   . Personal history of radiation therapy 2001   left breast ca  . Psoriasis     Past Surgical History:  Procedure Laterality Date  . ABDOMINAL HYSTERECTOMY    . ANKLE FRACTURE SURGERY  4/08   left---hardware still in place  . 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 BREAST EXCISIONAL BIOPSY Left yrs ago   benign  . BREAST LUMPECTOMY  2001   left  . CAROTID ENDARTERECTOMY Left 10/23/2004  . FRACTURE SURGERY    . SHOULDER SURGERY  6/07   left  . TONSILLECTOMY AND ADENOIDECTOMY    . TOTAL HIP ARTHROPLASTY  2004   right    There were no vitals filed for this visit.  Subjective Assessment - 06/14/17 1404    Subjective  Patient reports her shoulder is feeling than it did the previous visit but continues to have increased shoulder pain when lowering her arm from flexion.     Pertinent History  Patient has been being seen for balance and strengthening of her LEs. Patient reports no falls in the past six months. Patient reports pain has not improved for the past month and has been about the same since the onset of pain.     Patient Stated Goals  Improve shoulder pain    Currently in Pain?  No/denies    Pain Onset  More than a month ago       TREATMENT Therapeutic Exercise Seated shoulder pulleys - 5 min  TRX Rows in standing - x30 Shoulder AROM in standing - x 20 Sitting scapular retractions at OMEGA -x 20 10# Straight arm push down at [place] - x20 10# Standing ER with YTB - x 20  UE Ranger flexion - x 20  Low Row in standing at [place] - x 20 7# Serratus punches with YTB - x 20  Behind the back with 4# weight - x 20    Patient reports increase in fatigue at end of session.     PT Education - 06/14/17 1419    Education provided  Yes    Education Details  form/technique with exercise    Person(s) Educated  Patient  Methods  Explanation;Demonstration    Comprehension  Verbalized understanding;Returned demonstration          PT Long Term Goals - 06/03/17 1545      PT LONG TERM GOAL #1   Title  Patient will be independent with HEP to continue benefits of therapy after discharge.     Baseline  Dependent with form and technique     Time  6    Period  Weeks    Status  New    Target Date  07/01/17      PT LONG TERM GOAL #2   Title  Patient will have full pain free shoulder flexion AROM to be able to lifts items on a high cabinet without pain/difficulty.     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     Baseline  Increased pain  when dressing her husband    Time  6    Period  Weeks    Status  New    Target Date  07/15/17            Plan - 06/14/17 1423    Clinical Impression Statement  Patient demonstrates improvement with demonstrating greater AROM with less pain indicating functional carryover between sessions. Although patient is improving, she continues to have increased pain and spasms with performing shoulder flexion outside of BOS and will benefit from further skilled therapy to return to prior level of function.     Rehab Potential  Good    Clinical Impairments Affecting Rehab Potential  Positive: motivation; Negative: possible decreased foot sensation, age    PT Frequency  2x / week    PT Duration  6 weeks    PT Treatment/Interventions  ADLs/Self Care Home Management;Aquatic Therapy;Biofeedback;DME Instruction;Gait training;Stair training;Functional mobility training;Therapeutic activities;Therapeutic exercise;Balance training;Neuromuscular re-education;Patient/family education;Passive range of motion;Energy conservation;Ultrasound;Moist Heat;Iontophoresis 4mg /ml Dexamethasone;Cryotherapy;Electrical Stimulation;Manual techniques;Dry needling    PT Next Visit Plan  Progress balance and strengthening exercises,     PT Home Exercise Plan  See education section    Consulted and Agree with Plan of Care  Patient       Patient will benefit from skilled therapeutic intervention in order to improve the following deficits and impairments:  Pain, Increased fascial restricitons, Decreased coordination, Decreased mobility, Increased muscle spasms, Decreased activity tolerance, Decreased endurance, Decreased range of motion, Decreased strength, Hypomobility, Postural dysfunction  Visit Diagnosis: Acute pain of right shoulder  Muscle weakness (generalized)  History of falling  Unsteadiness on feet     Problem List Patient Active Problem List   Diagnosis Date Noted  . Pain and swelling of lower extremity,  right 03/29/2017  . Right shoulder pain 03/29/2017  . Diarrhea 05/18/2016  . Fall 05/01/2016  . Unintentional weight loss 12/30/2015  . Leg weakness, bilateral 12/30/2015  . Hyperkalemia 01/01/2015  . Occasional numbness/prickling/tingling of fingers and toes 12/18/2014  . Routine general medical examination at a health care facility 12/13/2013  . Macrocytic anemia 05/19/2013  . 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 Psoriasis   . Osteoarthritis, multiple sites   . Episodic mood  disorder ([place]) 12/01/2011  . History of breast cancer 10/28/2011  . GERD (gastroesophageal reflux disease)   . Hyperlipidemia   . Hypertension   . Osteopenia   . Occlusion and stenosis of carotid artery without mention of cerebral infarction 05/27/2011    Blythe Stanford, PT DPT 06/14/2017, 2:29 PM  [facility] [address], [place], [address] Phone: [phone]   Fax:  [phone]  Name: [name] MRN: [id number] Date of Birth: 1941/01/06

## 2017-06-17 ENCOUNTER — Ambulatory Visit: Payer: Medicare Other

## 2017-06-21 ENCOUNTER — Ambulatory Visit: Payer: Medicare Other | Attending: Family Medicine

## 2017-06-21 DIAGNOSIS — M25511 Pain in right shoulder: Secondary | ICD-10-CM | POA: Diagnosis not present

## 2017-06-21 DIAGNOSIS — R2681 Unsteadiness on feet: Secondary | ICD-10-CM | POA: Insufficient documentation

## 2017-06-21 DIAGNOSIS — M6281 Muscle weakness (generalized): Secondary | ICD-10-CM | POA: Diagnosis not present

## 2017-06-21 DIAGNOSIS — Z9181 History of falling: Secondary | ICD-10-CM | POA: Insufficient documentation

## 2017-06-21 NOTE — Therapy (Signed)
[facility] [address], [place], [address] Phone: [phone]   Fax:  [phone]  Physical Therapy Treatment  Patient Details  Name: [name] MRN: [id number] Date of Birth: 10-24-1940 Referring Provider: Dr. Marry Guan   Encounter Date: 06/21/2017  PT End of Session - 06/21/17 1017    Visit Number  5    Number of Visits  13    Date for PT Re-Evaluation  07/15/17    Authorization Type  g codes 5/10    PT Start Time  10/29/1005    PT Stop Time  1045    PT Time Calculation (min)  38 min    Equipment Utilized During Treatment  Gait belt    Activity Tolerance  Patient tolerated treatment well    Behavior During Therapy  [facility] for tasks assessed/performed       Past Medical History:  Diagnosis Date  . Breast cancer ([place]) 1999-10-30   left breast  . Cancer ([place]) 10/30/99   left breast ca  . GERD (gastroesophageal reflux disease)   . Hyperlipidemia   . Hypertension   . Osteoarthritis, multiple sites   . Osteoarthrosis involving, or with mention of more than one site, but not specified as generalized, multiple sites   . Osteopenia   . Personal history of radiation therapy Oct 30, 1999   left breast ca  . Psoriasis     Past Surgical History:  Procedure Laterality Date  . ABDOMINAL HYSTERECTOMY    . ANKLE FRACTURE SURGERY  4/08   left---hardware still in place  . BREAST BIOPSY Left 10-30-99   breast ca  . BREAST EXCISIONAL BIOPSY Left yrs ago   benign  . BREAST LUMPECTOMY  1999-10-30   left  . CAROTID ENDARTERECTOMY Left 10/23/2004  . FRACTURE SURGERY    . SHOULDER SURGERY  6/07   left  . TONSILLECTOMY AND ADENOIDECTOMY    . TOTAL HIP ARTHROPLASTY  10/30/02   right    There were no vitals filed for this visit.  Subjective Assessment - 06/21/17 1014    Subjective  Patient reports her shoulder is feeling much better than it did. Patient feels she is making improvement with physical therapy.     Pertinent History  Patient  has been being seen for balance and strengthening of her LEs. Patient reports no falls in the past six months. Patient reports pain has not improved for the past month and has been about the same since the onset of pain.     Patient Stated Goals  Improve shoulder pain    Currently in Pain?  No/denies    Pain Onset  More than a month ago         TREATMENT Therapeutic Exercise Nustep in sitting - 5 min level 36min Standing scapular retractions at OMEGA -x 20 10#  Low Row in standing at [place] - x 20 7# Shoulder flexion in standing - x 20  Behind the back with 4# weight - x 20 Standing Y's facing wall - x 20  Standing push up PLUS at the wall - x 20  TRX Rows in standing - x30 UE Ranger flexion - x 20 ;  x 20 3#    Patient reports increase in fatigue at end of session.     PT Education - 06/21/17 1016    Education provided  Yes    Education Details  form/technique with exercise    Person(s) Educated  Patient  Methods  Explanation;Demonstration    Comprehension  Returned demonstration;Verbalized understanding          PT Long Term Goals - 06/03/17 1545      PT LONG TERM GOAL #1   Title  Patient will be independent with HEP to continue benefits of therapy after discharge.     Baseline  Dependent with form and technique     Time  6    Period  Weeks    Status  New    Target Date  07/01/17      PT LONG TERM GOAL #2   Title  Patient will have full pain free shoulder flexion AROM to be able to lifts items on a high cabinet without pain/difficulty.     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     Baseline  Increased pain when dressing her husband    Time   6    Period  Weeks    Status  New    Target Date  07/15/17            Plan - 06/21/17 1022    Clinical Impression Statement  [place] continues to demonstrates improvement with over head motions with demonstration of no increase of pain with raising or lowering her arm overhead. Although patient is experiencing less pain, she continues to demonstrate decreased muscular endurance and strength. Patient will benefit from further skilled therapy to return to prior level of function.      Rehab Potential  Good    Clinical Impairments Affecting Rehab Potential  Positive: motivation; Negative: possible decreased foot sensation, age    PT Frequency  2x / week    PT Duration  6 weeks    PT Treatment/Interventions  ADLs/Self Care Home Management;Aquatic Therapy;Biofeedback;DME Instruction;Gait training;Stair training;Functional mobility training;Therapeutic activities;Therapeutic exercise;Balance training;Neuromuscular re-education;Patient/family education;Passive range of motion;Energy conservation;Ultrasound;Moist Heat;Iontophoresis 4mg /ml Dexamethasone;Cryotherapy;Electrical Stimulation;Manual techniques;Dry needling    PT Next Visit Plan  Progress balance and strengthening exercises,     PT Home Exercise Plan  See education section    Consulted and Agree with Plan of Care  Patient       Patient will benefit from skilled therapeutic intervention in order to improve the following deficits and impairments:  Pain, Increased fascial restricitons, Decreased coordination, Decreased mobility, Increased muscle spasms, Decreased activity tolerance, Decreased endurance, Decreased range of motion, Decreased strength, Hypomobility, Postural dysfunction  Visit Diagnosis: Acute pain of right shoulder  Muscle weakness (generalized)  History of falling  Unsteadiness on feet     Problem List Patient Active Problem List   Diagnosis Date Noted  . Pain and swelling of lower extremity, right 03/29/2017   . Right shoulder pain 03/29/2017  . Diarrhea 05/18/2016  . Fall 05/01/2016  . Unintentional weight loss 12/30/2015  . Leg weakness, bilateral 12/30/2015  . Hyperkalemia 01/01/2015  . Occasional numbness/prickling/tingling of fingers and toes 12/18/2014  . Routine general medical examination at a health care facility 12/13/2013  . Macrocytic anemia 05/19/2013  . 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 Psoriasis   . Osteoarthritis, multiple sites   .  Episodic mood disorder ([place]) 12/01/2011  . History of breast cancer 10/28/2011  . GERD (gastroesophageal reflux disease)   . Hyperlipidemia   . Hypertension   . Osteopenia   . Occlusion and stenosis of carotid artery without mention of cerebral infarction 05/27/2011    Blythe Stanford, PT DPT 06/21/2017, 10:41 AM  [facility] [address], [place], [address] Phone: [phone]   Fax:  [phone]  Name: [name] MRN: [id number] Date of Birth: February 28, 1941

## 2017-06-23 ENCOUNTER — Ambulatory Visit: Payer: Medicare Other

## 2017-06-23 DIAGNOSIS — M6281 Muscle weakness (generalized): Secondary | ICD-10-CM

## 2017-06-23 DIAGNOSIS — Z9181 History of falling: Secondary | ICD-10-CM | POA: Diagnosis not present

## 2017-06-23 DIAGNOSIS — M25511 Pain in right shoulder: Secondary | ICD-10-CM | POA: Diagnosis not present

## 2017-06-23 DIAGNOSIS — R2681 Unsteadiness on feet: Secondary | ICD-10-CM | POA: Diagnosis not present

## 2017-06-23 NOTE — Therapy (Signed)
[facility] [address], [place], [address] Phone: [phone]   Fax:  [phone]  Physical Therapy Treatment  Patient Details  Name: [name] MRN: [id number] Date of Birth: 07/02/1941 Referring Provider: Dr. Marry Guan   Encounter Date: 06/23/2017  PT End of Session - 06/23/17 1111    Visit Number  6    Number of Visits  13    Date for PT Re-Evaluation  07/15/17    Authorization Type  g codes 6/10    PT Start Time  1028-10-22    PT Stop Time  1045    PT Time Calculation (min)  15 min    Equipment Utilized During Treatment  Gait belt    Activity Tolerance  Patient tolerated treatment well    Behavior During Therapy  [facility] for tasks assessed/performed       Past Medical History:  Diagnosis Date  . Breast cancer ([place]) 10/23/99   left breast  . Cancer ([place]) Oct 23, 1999   left breast ca  . GERD (gastroesophageal reflux disease)   . Hyperlipidemia   . Hypertension   . Osteoarthritis, multiple sites   . Osteoarthrosis involving, or with mention of more than one site, but not specified as generalized, multiple sites   . Osteopenia   . Personal history of radiation therapy 23-Oct-1999   left breast ca  . Psoriasis     Past Surgical History:  Procedure Laterality Date  . ABDOMINAL HYSTERECTOMY    . ANKLE FRACTURE SURGERY  4/08   left---hardware still in place  . BREAST BIOPSY Left 10/23/99   breast ca  . BREAST EXCISIONAL BIOPSY Left yrs ago   benign  . BREAST LUMPECTOMY  10-23-99   left  . CAROTID ENDARTERECTOMY Left 10/23/2004  . FRACTURE SURGERY    . SHOULDER SURGERY  6/07   left  . TONSILLECTOMY AND ADENOIDECTOMY    . TOTAL HIP ARTHROPLASTY  2002/10/23   right    There were no vitals filed for this visit.  Subjective Assessment - 06/23/17 1108    Subjective  Patient reports her shoulder continues to feel much better since the start of therapy. Patient reports no pain since the previous session but states she has a  lump along the anterior aspect of her arm.     Pertinent History  Patient has been being seen for balance and strengthening of her LEs. Patient reports no falls in the past six months. Patient reports pain has not improved for the past month and has been about the same since the onset of pain.     Patient Stated Goals  Improve shoulder pain    Currently in Pain?  No/denies    Pain Onset  More than a month ago       TREATMENT: Therapeutic Exercise: Overhead to UE ranger, ER/IR at ranger - x 20 B Seated chest press - x 20 5# at the [facility] with PT assist secondary to weakness Shoulder flexion with 1# weight - x 25  Observation: + Popeye sign on the affected arm  Patient reports no increase in pain at end of session      PT Education - 06/23/17 1109    Education provided  Yes    Education Details  form/technique with exercise    Person(s) Educated  Patient    Methods  Explanation;Demonstration    Comprehension  Verbalized understanding;Returned demonstration          PT Long Term  Goals - 06/03/17 1545      PT LONG TERM GOAL #1   Title  Patient will be independent with HEP to continue benefits of therapy after discharge.     Baseline  Dependent with form and technique     Time  6    Period  Weeks    Status  New    Target Date  07/01/17      PT LONG TERM GOAL #2   Title  Patient will have full pain free shoulder flexion AROM to be able to lifts items on a high cabinet without pain/difficulty.     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     Baseline  Increased pain when dressing her husband    Time  6    Period  Weeks    Status  New    Target Date   07/15/17            Plan - 06/23/17 1112    Clinical Impression Statement  Performed shortened session today secondary to not wanting to leave husband (who has Alzheimers) at home without cargiver support for an extended period of time (patient thought her appointment was at a different time). Patient demonstrates increased pain after repeated repetitions performed but is resolved after a period of rest. Patient will benefit from further skilled therapy to return to prior level of function.     Rehab Potential  Good    Clinical Impairments Affecting Rehab Potential  Positive: motivation; Negative: possible decreased foot sensation, age    PT Frequency  2x / week    PT Duration  6 weeks    PT Treatment/Interventions  ADLs/Self Care Home Management;Aquatic Therapy;Biofeedback;DME Instruction;Gait training;Stair training;Functional mobility training;Therapeutic activities;Therapeutic exercise;Balance training;Neuromuscular re-education;Patient/family education;Passive range of motion;Energy conservation;Ultrasound;Moist Heat;Iontophoresis 4mg /ml Dexamethasone;Cryotherapy;Electrical Stimulation;Manual techniques;Dry needling    PT Next Visit Plan  Progress balance and strengthening exercises,     PT Home Exercise Plan  See education section    Consulted and Agree with Plan of Care  Patient       Patient will benefit from skilled therapeutic intervention in order to improve the following deficits and impairments:  Pain, Increased fascial restricitons, Decreased coordination, Decreased mobility, Increased muscle spasms, Decreased activity tolerance, Decreased endurance, Decreased range of motion, Decreased strength, Hypomobility, Postural dysfunction  Visit Diagnosis: Acute pain of right shoulder  Muscle weakness (generalized)     Problem List Patient Active Problem List   Diagnosis Date Noted  . Pain and swelling of lower extremity, right 03/29/2017  . Right shoulder pain 03/29/2017   . Diarrhea 05/18/2016  . Fall 05/01/2016  . Unintentional weight loss 12/30/2015  . Leg weakness, bilateral 12/30/2015  . Hyperkalemia 01/01/2015  . Occasional numbness/prickling/tingling of fingers and toes 12/18/2014  . Routine general medical examination at a health care facility 12/13/2013  . Macrocytic anemia 05/19/2013  . 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 Psoriasis   . Osteoarthritis, multiple sites   . Episodic mood disorder ([place]) 12/01/2011  . History of breast cancer 10/28/2011  . GERD (gastroesophageal reflux  disease)   . Hyperlipidemia   . Hypertension   . Osteopenia   . Occlusion and stenosis of carotid artery without mention of cerebral infarction 05/27/2011    Blythe Stanford, PT DPT 06/23/2017, 12:45 PM  [facility] [address] [place], [address] Phone: [phone]   Fax:  [phone]  Name: [name] MRN: [id number] Date of Birth: 1940/11/22

## 2017-06-25 ENCOUNTER — Telehealth: Payer: Self-pay | Admitting: Family Medicine

## 2017-06-25 MED ORDER — LORAZEPAM 0.5 MG PO TABS: ORAL_TABLET | ORAL | 0 refills | Status: DC

## 2017-06-25 NOTE — Telephone Encounter (Signed)
Copied from [place] [id number]. Topic: General - Other >> Jun 25, 2017 11:08 AM Yvette Rack wrote: Reason for CRM: patient calling to let Dr Caryl Bis know that 911 came to her home this morning because of nose bleed she didn't go to hospital because she promised EMS that she would let her provider know that this had happened

## 2017-06-25 NOTE — Telephone Encounter (Signed)
Copied from [place] [id number]. Topic: Quick Communication - Rx Refill/Question >> Jun 25, 2017 11:45 AM Yvette Rack wrote: Has the patient contacted their pharmacy? No.   (Agent: If no, request that the patient contact the pharmacy for the refill.)   Lorazepam 0.5mg    Preferred Pharmacy (with phone number or street name): [facility] [address]  [phone]   Agent: Please be advised that RX refills may take up to 3 business days. We ask that you follow-up with your pharmacy.

## 2017-06-25 NOTE — Telephone Encounter (Signed)
Copied from [place] [phone]. Topic: General - Other >> Jun 25, 2017 11:08 AM Yvette Rack wrote: Reason for CRM: patient calling to let Dr Caryl Bis know that 911 came to her home this morning because of nose bleed she didn't go to hospital because she promised EMS that she would let her provider know that this had happened

## 2017-06-25 NOTE — Telephone Encounter (Signed)
Has been forwarded to Dr. Caryl Bis.

## 2017-06-25 NOTE — Telephone Encounter (Signed)
faxed

## 2017-06-25 NOTE — Telephone Encounter (Signed)
Noted. She should be evaluated. Please fax rx. Thanks.

## 2017-06-25 NOTE — Telephone Encounter (Signed)
Patient stated she was having sharpe pain in ears and  Blood was coming from mouth and nose, patient stated that sneezing was what started the nose bleed, that she tried applying Ice and squeezing nose and applying pressure when this did not work she called EMS  came out and applied pressure and bleeding stopped but they tried to get her to be evaluated but she explained she has a husband with alzheimers she cannot leave.  Patient states she feels fine now bt that once in a while she still will have a drop or two of blood from nose.  Advised patient she should really be evaluated due to the sharpe pain in ears and the prolonged bleed time patient refused and stated again she cannot leave her husband. Patient also ask about Lorazepam 0.5 mg refill? Last filled, last OV 03/29/17.

## 2017-06-25 NOTE — Telephone Encounter (Signed)
Has then been faxed?

## 2017-06-28 ENCOUNTER — Ambulatory Visit: Payer: Medicare Other

## 2017-07-01 ENCOUNTER — Telehealth: Payer: Self-pay | Admitting: Family Medicine

## 2017-07-01 ENCOUNTER — Ambulatory Visit: Payer: Medicare Other

## 2017-07-01 DIAGNOSIS — R2689 Other abnormalities of gait and mobility: Secondary | ICD-10-CM

## 2017-07-01 DIAGNOSIS — M6281 Muscle weakness (generalized): Secondary | ICD-10-CM | POA: Diagnosis not present

## 2017-07-01 DIAGNOSIS — M25511 Pain in right shoulder: Secondary | ICD-10-CM

## 2017-07-01 DIAGNOSIS — Z9181 History of falling: Secondary | ICD-10-CM | POA: Diagnosis not present

## 2017-07-01 DIAGNOSIS — S46211A Strain of muscle, fascia and tendon of other parts of biceps, right arm, initial encounter: Secondary | ICD-10-CM | POA: Diagnosis not present

## 2017-07-01 DIAGNOSIS — R2681 Unsteadiness on feet: Secondary | ICD-10-CM | POA: Diagnosis not present

## 2017-07-01 NOTE — Therapy (Signed)
[facility] [id number] [place], [address] Phone: [phone]   Fax:  [phone]  Physical Therapy Treatment  Patient Details  Name: [name] MRN: [id number] Date of Birth: 07-29-1940 Referring Provider: Dr. Marry Guan   Encounter Date: 07/01/2017  PT End of Session - 07/01/17 1205    Visit Number  7    Number of Visits  13    Date for PT Re-Evaluation  07/15/17    Authorization Type  g codes 7/10    PT Start Time  10/20/1117    PT Stop Time  1200    PT Time Calculation (min)  41 min    Equipment Utilized During Treatment  Gait belt    Activity Tolerance  Patient tolerated treatment well    Behavior During Therapy  [facility] for tasks assessed/performed       Past Medical History:  Diagnosis Date  . Breast cancer ([place]) 1999/10/21   left breast  . Cancer ([place]) 10-21-99   left breast ca  . GERD (gastroesophageal reflux disease)   . Hyperlipidemia   . Hypertension   . Osteoarthritis, multiple sites   . Osteoarthrosis involving, or with mention of more than one site, but not specified as generalized, multiple sites   . Osteopenia   . Personal history of radiation therapy 10-21-1999   left breast ca  . Psoriasis     Past Surgical History:  Procedure Laterality Date  . ABDOMINAL HYSTERECTOMY    . ANKLE FRACTURE SURGERY  4/08   left---hardware still in place  . BREAST BIOPSY Left 10-21-99   breast ca  . BREAST EXCISIONAL BIOPSY Left yrs ago   benign  . BREAST LUMPECTOMY  10-21-99   left  . CAROTID ENDARTERECTOMY Left 10/23/2004  . FRACTURE SURGERY    . SHOULDER SURGERY  6/07   left  . TONSILLECTOMY AND ADENOIDECTOMY    . TOTAL HIP ARTHROPLASTY  2002/10/21   right    There were no vitals filed for this visit.  Subjective Assessment - 07/01/17 1203    Subjective  Patient reports her shoulder has conitnued to feel better since the last session and is ready to discontinue and be discharged from her shoulder physical  therapy. Patient would like to continue performing balance physical therapy secondary to continuing to feel unsteady when standing.     Pertinent History  Patient has been being seen for balance and strengthening of her LEs. Patient reports no falls in the past six months. Patient reports pain has not improved for the past month and has been about the same since the onset of pain.     Patient Stated Goals  Improve shoulder pain    Currently in Pain?  No/denies    Pain Onset  More than a month ago       TREATMENT Therapeutic Exercise Standing Scapular retraction marching on airex pad - x20  Wedding Marches while holding 6# weights in each hand - 2 x 19ft Side stepping over airex pad down and back 2 x 10 TRX squats in standing - x 20 with UE support  Side stepping over airex beam with YTB around knees - 2 x 10  Overhead flexion with body blade on airex pad - 2 x 10  Heel raises off of airex beam - x 20   Patient demonstrates increased fatigue at end of the session.    PT Education - 07/01/17 1205    Education provided  Yes    Education Details  form/technique with exercise    Person(s) Educated  Patient    Methods  Explanation;Demonstration    Comprehension  Verbalized understanding;Returned demonstration          PT Long Term Goals - 07-11-17 1215      PT LONG TERM GOAL #1   Title  Patient will be independent with HEP to continue benefits of therapy after discharge.     Baseline  Dependent with form and technique; Independent with technique    Time  6    Period  Weeks    Status  Achieved      PT LONG TERM GOAL #2   Title  Patient will have full pain free shoulder flexion AROM to be able to lifts items on a high cabinet without pain/difficulty.     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     Baseline  Increased pain when dressing her husband; no increased pain    Time  6    Period  Weeks    Status  Achieved            Plan - 2017/07/11 1205    Clinical Impression Statement  Patient demonstrates significant and functional improvement with shoulder function with ability to perform shoulder flexion to the same level as the contralateral UE. Patient also reports she has baeen able to dress her husband without increased pain and difficutly. Patient is to be discharged at the end of this session secondary to goal completion.      Rehab Potential  Good    Clinical Impairments Affecting Rehab Potential  Positive: motivation; Negative: possible decreased foot sensation, age    PT Frequency  2x / week    PT Duration  6 weeks    PT Treatment/Interventions  ADLs/Self Care Home Management;Aquatic Therapy;Biofeedback;DME Instruction;Gait training;Stair training;Functional mobility training;Therapeutic activities;Therapeutic exercise;Balance training;Neuromuscular re-education;Patient/family education;Passive range of motion;Energy conservation;Ultrasound;Moist Heat;Iontophoresis 4mg /ml Dexamethasone;Cryotherapy;Electrical Stimulation;Manual techniques;Dry needling    PT Next Visit Plan  Progress balance and strengthening exercises,     PT Home Exercise Plan  See education section    Consulted and Agree with Plan of Care  Patient       Patient will benefit from skilled therapeutic intervention in order to improve the following deficits and impairments:  Pain, Increased fascial restricitons, Decreased coordination, Decreased mobility, Increased muscle spasms, Decreased activity tolerance, Decreased endurance, Decreased range of motion, Decreased strength, Hypomobility, Postural dysfunction  Visit Diagnosis: Acute pain of right  shoulder  Muscle weakness (generalized)   G-Codes - 07/11/2017 1216    Functional Assessment Tool Used (Outpatient Only)  clinical judgement, MMT, painful arc, AROM    Functional Limitation  Changing and maintaining body position    Changing and Maintaining Body Position Current Status (K9983)  At least 1 percent but less than 20 percent impaired, limited or restricted    Changing and Maintaining Body Position Goal Status (J8250)  At least 1 percent but less than 20 percent impaired, limited or restricted    Changing and Maintaining Body Position Discharge  Status [phone])  At least 1 percent but less than 20 percent impaired, limited or restricted       Problem List Patient Active Problem List   Diagnosis Date Noted  . Pain and swelling of lower extremity, right 03/29/2017  . Right shoulder pain 03/29/2017  . Diarrhea 05/18/2016  . Fall 05/01/2016  . Unintentional weight loss 12/30/2015  . Leg weakness, bilateral 12/30/2015  . Hyperkalemia 01/01/2015  . Occasional numbness/prickling/tingling of fingers and toes 12/18/2014  . Routine general medical examination at a health care facility 12/13/2013  . Macrocytic anemia 05/19/2013  . 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 Psoriasis   . Osteoarthritis, multiple sites   . Episodic mood disorder ([place]) 12/01/2011  . History of breast cancer 10/28/2011  . GERD (gastroesophageal reflux disease)   . Hyperlipidemia   . Hypertension   . Osteopenia   . Occlusion and stenosis of carotid artery without mention of cerebral infarction 05/27/2011    Blythe Stanford, PT DPT 07/01/2017, 12:18 PM  [facility] [address] [place], [address] Phone: [phone]   Fax:  [phone]  Name: [name] MRN: [id number] Date of Birth: April 02, 1941

## 2017-07-01 NOTE — Telephone Encounter (Signed)
Pt states she would like another referral to the Physical therapist. Please and thank you!

## 2017-07-02 NOTE — Telephone Encounter (Signed)
Referral placed. Please make sure this is the chronic balance issue she was having and not something new. If new she should be evaluated.

## 2017-07-02 NOTE — Telephone Encounter (Signed)
Patient would like a referral for balance was told by physical therapy she would need another referral

## 2017-07-02 NOTE — Telephone Encounter (Signed)
Noted. Thanks.

## 2017-07-02 NOTE — Telephone Encounter (Signed)
Patient states its the same balance issue as before

## 2017-07-05 ENCOUNTER — Other Ambulatory Visit: Payer: Self-pay

## 2017-07-05 ENCOUNTER — Ambulatory Visit (INDEPENDENT_AMBULATORY_CARE_PROVIDER_SITE_OTHER): Payer: Medicare Other | Admitting: Family Medicine

## 2017-07-05 ENCOUNTER — Encounter: Payer: Self-pay | Admitting: Family Medicine

## 2017-07-05 VITALS — BP 110/80 | HR 69 | Temp 97.6°F | Wt 125.6 lb

## 2017-07-05 DIAGNOSIS — I6529 Occlusion and stenosis of unspecified carotid artery: Secondary | ICD-10-CM | POA: Diagnosis not present

## 2017-07-05 DIAGNOSIS — I10 Essential (primary) hypertension: Secondary | ICD-10-CM

## 2017-07-05 DIAGNOSIS — F39 Unspecified mood [affective] disorder: Secondary | ICD-10-CM

## 2017-07-05 DIAGNOSIS — E785 Hyperlipidemia, unspecified: Secondary | ICD-10-CM

## 2017-07-05 DIAGNOSIS — D539 Nutritional anemia, unspecified: Secondary | ICD-10-CM

## 2017-07-05 DIAGNOSIS — W19XXXD Unspecified fall, subsequent encounter: Secondary | ICD-10-CM | POA: Diagnosis not present

## 2017-07-05 DIAGNOSIS — H04123 Dry eye syndrome of bilateral lacrimal glands: Secondary | ICD-10-CM | POA: Diagnosis not present

## 2017-07-05 NOTE — Progress Notes (Signed)
[name], MD Phone: [phone]  JOSEFINA Harris is a 76 y.o. female who presents today for follow-up.  Anxiety/depression: Notes this is quite a bit better.  Notes occasional symptoms.  No SI.  She is taking Remeron and Ativan.  Ativan not that frequently and it does not make her drowsy.  She has a history of carotid artery stenosis.  She follows up every 2 years for duplex.  She notes no numbness, weakness, or vision changes.  She reports dry eyes that only occur at night and she has discussed this with her ophthalmologist and they prescribed a cream to try though this has not helped significantly.  She has tried over-the-counter moisturizing drops that are not beneficial either.  She does note occasional dry mouth but this only occurs at night as well.  Hypertension: She is taking metoprolol.  No chest pain or shortness of breath.  Physical therapy has been very beneficial for her balance and she is going to restart this after being treated for right shoulder pain with therapy as advised by her orthopedist.  Social History   Tobacco Use  Smoking Status Former Smoker  . Packs/day: 1.00  . Years: 40.00  . Pack years: 40.00  . Types: Cigarettes  . Last attempt to quit: 07/21/1995  . Years since quitting: 21.9  Smokeless Tobacco Never Used     ROS see history of present illness  Objective  Physical Exam Vitals:   07/05/17 1554  BP: 110/80  Pulse: 69  Temp: 97.6 F (36.4 C)  SpO2: 99%    BP Readings from Last 3 Encounters:  07/05/17 110/80  03/29/17 122/82  12/29/16 140/88   Wt Readings from Last 3 Encounters:  07/05/17 125 lb 9.6 oz (57 kg)  03/29/17 122 lb 3.2 oz (55.4 kg)  12/21/16 116 lb (52.6 kg)    Physical Exam  Constitutional: No distress.  Eyes: Conjunctivae are normal. Pupils are equal, round, and reactive to light.  Cardiovascular: Normal rate, regular rhythm and normal heart sounds.  No carotid bruits  Pulmonary/Chest: Effort normal and  breath sounds normal.  Musculoskeletal: She exhibits no edema.  Neurological: She is alert. Gait normal.  Skin: Skin is warm and dry. She is not diaphoretic.     Assessment/Plan: Please see individual problem list.  Carotid stenosis History of this.  Followed by vascular surgery every 2 years.  She will continue to follow with them.  Given return precautions.  Episodic mood disorder Much improved.  She will continue her current regimen.  Fall Notes physical therapy has been beneficial.  She will resume this.  Dry eyes Discussed contacting her ophthalmologist to get further recommendations.  Macrocytic anemia Due for recheck of CBC.  Macrocytosis has been stable.  Previously evaluated by hematology.   [name] was seen today for follow-up.  Diagnoses and all orders for this visit:  Essential hypertension -     Comp Met (CMET); Future  Macrocytic anemia -     CBC; Future  Hyperlipidemia, unspecified hyperlipidemia type -     Lipid panel; Future  Stenosis of carotid artery, unspecified laterality  Episodic mood disorder ([place])  Fall, subsequent encounter  Dry eyes    Orders Placed This Encounter  Procedures  . Lipid panel    Standing Status:   Future    Standing Expiration Date:   07/05/2018  . Comp Met (CMET)    Standing Status:   Future    Standing Expiration Date:   07/05/2018  . CBC  Standing Status:   Future    Standing Expiration Date:   07/05/2018    No orders of the defined types were placed in this encounter.    [name], MD [place]

## 2017-07-05 NOTE — Patient Instructions (Signed)
Nice to see you. We will have you return for lab work. Please contact your ophthalmologist to see if they can recommend a treatment for your dry eyes.

## 2017-07-06 ENCOUNTER — Ambulatory Visit: Payer: Medicare Other

## 2017-07-06 ENCOUNTER — Other Ambulatory Visit: Payer: Self-pay | Admitting: Family Medicine

## 2017-07-06 DIAGNOSIS — Z9181 History of falling: Secondary | ICD-10-CM | POA: Diagnosis not present

## 2017-07-06 DIAGNOSIS — R2681 Unsteadiness on feet: Secondary | ICD-10-CM

## 2017-07-06 DIAGNOSIS — M25511 Pain in right shoulder: Secondary | ICD-10-CM | POA: Diagnosis not present

## 2017-07-06 DIAGNOSIS — H04123 Dry eye syndrome of bilateral lacrimal glands: Secondary | ICD-10-CM | POA: Insufficient documentation

## 2017-07-06 DIAGNOSIS — M6281 Muscle weakness (generalized): Secondary | ICD-10-CM | POA: Diagnosis not present

## 2017-07-06 NOTE — Assessment & Plan Note (Signed)
Due for recheck of CBC.  Macrocytosis has been stable.  Previously evaluated by hematology.

## 2017-07-06 NOTE — Assessment & Plan Note (Signed)
Notes physical therapy has been beneficial.  She will resume this.

## 2017-07-06 NOTE — Assessment & Plan Note (Signed)
Much improved.  She will continue her current regimen.

## 2017-07-06 NOTE — Telephone Encounter (Signed)
Refilled: 01/15/2017 Last OV: 07/05/2017 Next OV: 01/03/2018

## 2017-07-06 NOTE — Therapy (Signed)
[facility] [address], [place], [address] Phone: [phone]   Fax:  [phone]  Physical Therapy Treatment  Patient Details  Name: [name] MRN: [id number] Date of Birth: [birth year] Referring Provider: Caryl Bis MD   Encounter Date: 07/06/2017  PT End of Session - 07/06/17 1238    Visit Number  1    Number of Visits  16    Date for PT Re-Evaluation  08/31/17    Authorization Type  1 / 10 G code    PT Start Time  1130    PT Stop Time  1200    PT Time Calculation (min)  30 min    Activity Tolerance  Patient tolerated treatment well    Behavior During Therapy  [facility] for tasks assessed/performed       Past Medical History:  Diagnosis Date  . Breast cancer ([place]) 2001   left breast  . Cancer ([place]) 2001   left breast ca  . GERD (gastroesophageal reflux disease)   . Hyperlipidemia   . Hypertension   . Osteoarthritis, multiple sites   . Osteoarthrosis involving, or with mention of more than one site, but not specified as generalized, multiple sites   . Osteopenia   . Personal history of radiation therapy 2001   left breast ca  . Psoriasis     Past Surgical History:  Procedure Laterality Date  . ABDOMINAL HYSTERECTOMY    . ANKLE FRACTURE SURGERY  4/08   left---hardware still in place  . 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 BREAST EXCISIONAL BIOPSY Left yrs ago   benign  . BREAST LUMPECTOMY  2001   left  . CAROTID ENDARTERECTOMY Left 10/23/2004  . FRACTURE SURGERY    . SHOULDER SURGERY  6/07   left  . TONSILLECTOMY AND ADENOIDECTOMY    . TOTAL HIP ARTHROPLASTY  2004   right    There were no vitals filed for this visit.  Subjective Assessment - 07/06/17 1206    Subjective  Patient reports she has been having difficulty with her balanace and performing ADLs such as getting dressed, reaching down for bathing, ascending/descending the stiars and walking over uneven surfaces.  Patient also reports she often has to wake up in the middle of the night to use the restroom and has diffculty walking to the restroom because of a decreased ability to open her eyes at night. Patient reports she feels unsafe with walking and reports she has made progress from previous therapy but is not yet to the level of safety she would like to feel when ambulating. Patient states she take care of her husband with Alzheimers which has been stressful and functionally challenging most notably with dressing him. Patient does not reports any pain currently.     Pertinent History  Patient previously seen for balance difficulties and shoulder pain and the right.  Patient reports no falls in the past six months. Patient reports pain has not improved for the past month and has been about the same since the onset of pain.     Limitations  Walking    Patient Stated Goals  To improve balance when walking     Currently in Pain?  No/denies    Pain Onset  More than a month ago         [facility] PT Assessment - 07/06/17 1215      Assessment   Medical Diagnosis  Fall  Referring Provider  Sonnenberg MD    Onset Date/Surgical Date  10/20/15 Approximate    Hand Dominance  Right    Next MD Visit  None reported    Prior Therapy  yes      Precautions   Precautions  None      Restrictions   Weight Bearing Restrictions  No      Balance Screen   Has the patient fallen in the past 6 months  No    Has the patient had a decrease in activity level because of a fear of falling?   No    Is the patient reluctant to leave their home because of a fear of falling?   No      Home Film/video editor residence    Living Arrangements  Spouse/significant other    Available Help at Discharge  Family    Type of Home  Other(Comment) [place]  Level entry    [place]  One level    [place]  None      Prior Function   Level of [place]  Retired     Tree surgeon, reading, play games      Cognition   Overall Cognitive Status  Within Functional Limits for tasks assessed      Observation/Other Assessments   Other Surveys   --    Activities of Balance Confidence Scale (ABC Scale)   --      Sensation   Light Touch  Appears Intact    Additional Comments  --      Posture/Postural Control   Posture Comments  Forward head posture, lumber flexion in standing (decreased hip extension)      ROM / Strength   AROM / PROM / Strength  AROM;Strength      AROM   AROM Assessment Site  Hip    Right/Left Shoulder  Right;Left    Right/Left Hip  Left;Right    Right Hip Flexion  110    Right Hip External Rotation   10    Right Hip Internal Rotation   30    Right Hip ABduction  40    Right Hip ADduction  20    Left Hip Flexion  110    Left Hip External Rotation   10    Left Hip Internal Rotation   30    Left Hip ABduction  40    Left Hip ADduction  20      Strength   Overall Strength Comments  --    Strength Assessment Site  Hip;Knee;Ankle    Right/Left Hip  Right;Left    Right Hip Flexion  4-/5    Right Hip External Rotation   3-/5    Right Hip Internal Rotation  4/5    Right Hip ABduction  4/5    Right Hip ADduction  4+/5    Left Hip Flexion  4-/5    Left Hip External Rotation  4/5    Left Hip Internal Rotation  4/5    Left Hip ABduction  4/5    Left Hip ADduction  4+/5    Right/Left Knee  Right;Left    Right Knee Flexion  4/5    Right Knee Extension  5/5    Left Knee Flexion  4/5    Left Knee Extension  5/5    Right/Left Ankle  Right;Left    Right Ankle Dorsiflexion  4+/5    Left Ankle Dorsiflexion  4+/5      Palpation   Palpation comment  --      Transfers   Five time sit to stand comments   19sec with pillow on chair    Comments  FGA: 22/30      Ambulation/Gait   Gait Comments  L antalgic gait, decreased hip extension in terminal stance, slight increase in lumbar  flexion      Standardized Balance Assessment   Standardized Balance Assessment  Dynamic Gait Index;Five Times Sit to Stand;10 meter walk test    Five times sit to stand comments   -- Pt unable to perform without UE support    10 Meter Walk  1.1 m/s      Berg Balance Test   Sit to Stand  --    Standing Unsupported  --    Sitting with Back Unsupported but Feet Supported on Floor or Stool  --    Stand to Sit  --    Transfers  --    Standing Unsupported with Eyes Closed  --    Standing Ubsupported with Feet Together  --    From Standing, Reach Forward with Outstretched Arm  --    From Standing Position, Pick up Object from Floor  --    From Standing Position, Turn to Look Behind Over each Shoulder  --    Turn 360 Degrees  --    Standing Unsupported, Alternately Place Feet on Step/Stool  --    Standing Unsupported, One Foot in Front  --    Standing on One Leg  --    Total Score  --      Dynamic Gait Index   Level Surface  Normal    Change in Gait Speed  Normal    Gait with Horizontal Head Turns  Normal    Gait with Vertical Head Turns  Normal    Gait and Pivot Turn  Normal    Step Over Obstacle  Normal    Step Around Obstacles  Normal    Steps  Mild Impairment    Total Score  23      Timed Up and Go Test   TUG  --    Normal TUG (seconds)  --       TREATMENT: Standing single leg balance -- x30sec  Standing hip external rotation in standing -- x 20   Patient demonstrates increased fatigue at end of the session      PT Education - 07/06/17 1238    Education provided  Yes    Education Details  HEP: single leg stance, hip external rot in standing    Person(s) Educated  Patient    Methods  Explanation;Demonstration    Comprehension  Verbalized understanding;Returned demonstration          PT Long Term Goals - 07/06/17 1247      PT LONG TERM GOAL #1   Title  Patient will be independent with HEP to continue benefits of therapy after discharge.     Baseline   Dependent with form and technique for balancing exercises    Time  4    Period  Weeks    Status  New    Target Date  08/03/17      PT LONG TERM GOAL #2   Title  Patient will improve FGA to over 26/30 to indicate functional improvement in strength and balance limitations and  decrease fall risk    Baseline  22/30    Time  8    Period  Weeks    Status  New    Target Date  08/31/17      PT LONG TERM GOAL #3   Title  Patient will improve 5xSTS to under 16 seconds without use of a pillow to indicate functional improvement of LE function and decreased fall risk    Baseline  19 sec with pillow under chair    Time  8    Period  Weeks    Status  New    Target Date  08/31/17      PT LONG TERM GOAL #4   Title  Patient will be able to balance for 10sec with SLS to improve static balance and ability to get dressed when in standing    Baseline  1 sec     Time  8    Period  Weeks    Status  New    Target Date  08/31/17            Plan - 07/06/17 1239    Clinical Impression Statement  Patient is a 76 yo right hand dominant female presenting with balance difficulties and LE weakness. Patient demonstrates increased fall risk and balance difficulties indicated by decreased 5xSTS times, decreased score of the FGA (functional gait assessment) and decreased MMT scores. Patient will benefit from further skilled therapy focused on improving limitations to return to prior level of function.     History and Personal Factors relevant to plan of care:  Previous Balance dififculties, shoulder pain    Clinical Presentation  Stable    Clinical Presentation due to:  No recent worsening of symptoms    Clinical Decision Making  Low    Rehab Potential  Good    Clinical Impairments Affecting Rehab Potential  Positive: motivation; Negative: , age    PT Frequency  2x / week    PT Duration  6 weeks    PT Treatment/Interventions  ADLs/Self Care Home Management;Aquatic Therapy;Biofeedback;DME Instruction;Gait  training;Stair training;Functional mobility training;Therapeutic activities;Therapeutic exercise;Balance training;Neuromuscular re-education;Patient/family education;Passive range of motion;Energy conservation;Ultrasound;Moist Heat;Iontophoresis 4mg /ml Dexamethasone;Cryotherapy;Electrical Stimulation;Manual techniques;Dry needling    PT Next Visit Plan  Progress balance and strengthening exercises,     PT Home Exercise Plan  See education section    Consulted and Agree with Plan of Care  Patient       Patient will benefit from skilled therapeutic intervention in order to improve the following deficits and impairments:  Pain, Increased fascial restricitons, Decreased coordination, Decreased mobility, Decreased activity tolerance, Decreased endurance, Decreased range of motion, Decreased strength, Hypomobility, Postural dysfunction, Abnormal gait, Decreased balance, Difficulty walking  Visit Diagnosis: Muscle weakness (generalized) - Plan: PT plan of care cert/re-cert  History of falling - Plan: PT plan of care cert/re-cert  Unsteadiness on feet - Plan: PT plan of care cert/re-cert   G-Codes - 73/71/06 1253    Functional Assessment Tool Used (Outpatient Only)  clinical judgement, MMT, FGA, 5xSTS    Functional Limitation  Mobility: Walking and moving around    Mobility: Walking and Moving Around Current Status ([id number])  At least 20 percent but less than 40 percent impaired, limited or restricted    Mobility: Walking and Moving Around Goal Status [phone])  At least 1 percent but less than 20 percent impaired, limited or restricted       Problem List Patient Active Problem List   Diagnosis Date Noted  . Pain  and swelling of lower extremity, right 03/29/2017  . Right shoulder pain 03/29/2017  . Diarrhea 05/18/2016  . Fall 05/01/2016  . Unintentional weight loss 12/30/2015  . Leg weakness, bilateral 12/30/2015  . Hyperkalemia 01/01/2015  . Occasional numbness/prickling/tingling of fingers  and toes 12/18/2014  . Routine general medical examination at a health care facility 12/13/2013  . Macrocytic anemia 05/19/2013  . 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 Psoriasis   . Osteoarthritis, multiple sites   . Episodic mood disorder ([place]) 12/01/2011  . History of breast cancer 10/28/2011  . GERD (gastroesophageal reflux disease)   . Hyperlipidemia   . Hypertension   . Osteopenia   . Occlusion and stenosis of carotid artery without mention of cerebral infarction 05/27/2011    Blythe Stanford, PT DPT 07/06/2017, 12:57 PM  [facility] [address] [place], [address] Phone: [phone]   Fax:  [phone]  Name: [name] MRN: [id number] Date of Birth: [birth year]

## 2017-07-06 NOTE — Assessment & Plan Note (Signed)
History of this.  Followed by vascular surgery every 2 years.  She will continue to follow with them.  Given return precautions.

## 2017-07-06 NOTE — Assessment & Plan Note (Signed)
Discussed contacting her ophthalmologist to get further recommendations.

## 2017-07-08 ENCOUNTER — Ambulatory Visit: Payer: Medicare Other

## 2017-07-08 DIAGNOSIS — R2681 Unsteadiness on feet: Secondary | ICD-10-CM

## 2017-07-08 DIAGNOSIS — M25511 Pain in right shoulder: Secondary | ICD-10-CM | POA: Diagnosis not present

## 2017-07-08 DIAGNOSIS — M6281 Muscle weakness (generalized): Secondary | ICD-10-CM

## 2017-07-08 DIAGNOSIS — Z9181 History of falling: Secondary | ICD-10-CM | POA: Diagnosis not present

## 2017-07-08 NOTE — Therapy (Signed)
[facility] [address] [place], [address] Phone: [phone]   Fax:  [phone]  Physical Therapy Treatment  Patient Details  Name: [name] MRN: [id number] Date of Birth: Dec 24, 1940 Referring Provider: Caryl Bis MD   Encounter Date: 07/08/2017  PT End of Session - 07/08/17 1422    Visit Number  2    Number of Visits  16    Date for PT Re-Evaluation  08/31/17    Authorization Type  2 / 10 G code    PT Start Time  1350    PT Stop Time  1430    PT Time Calculation (min)  40 min    Activity Tolerance  Patient tolerated treatment well    Behavior During Therapy  [facility] for tasks assessed/performed       Past Medical History:  Diagnosis Date  . Breast cancer ([place]) 2001   left breast  . Cancer ([place]) 2001   left breast ca  . GERD (gastroesophageal reflux disease)   . Hyperlipidemia   . Hypertension   . Osteoarthritis, multiple sites   . Osteoarthrosis involving, or with mention of more than one site, but not specified as generalized, multiple sites   . Osteopenia   . Personal history of radiation therapy 2001   left breast ca  . Psoriasis     Past Surgical History:  Procedure Laterality Date  . ABDOMINAL HYSTERECTOMY    . ANKLE FRACTURE SURGERY  4/08   left---hardware still in place  . 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 BREAST EXCISIONAL BIOPSY Left yrs ago   benign  . BREAST LUMPECTOMY  2001   left  . CAROTID ENDARTERECTOMY Left 10/23/2004  . FRACTURE SURGERY    . SHOULDER SURGERY  6/07   left  . TONSILLECTOMY AND ADENOIDECTOMY    . TOTAL HIP ARTHROPLASTY  2004   right    There were no vitals filed for this visit.  Subjective Assessment - 07/08/17 1406    Subjective  Patient reports she continues to have baalncing difficulties with walking within narrow bases of support.     Pertinent History  Patient previously seen for balance difficulties and shoulder pain and the  right.  Patient reports no falls in the past six months. Patient reports pain has not improved for the past month and has been about the same since the onset of pain.     Limitations  Walking    Patient Stated Goals  To improve balance when walking     Currently in Pain?  No/denies    Pain Onset  More than a month ago       TREATMENT: Therapeutic Exercise: Hip abduction in standing with UE support - 2 x 15 Hip abd/ext in standing with UE support - 2 x 15  Backward walking - 2 x 58ft  Walking with eyes closed - 2 x 38ft  Nustep level 5 with use of UE for improvement in strength - 5 min  Tandem Ambulation without UE support - 2 x 34ft Tandem Stance with  intermittent UE support - 2 x 30sec B Single leg stance - 2 x 45sec TRX squats in standing with UE support - x15 Patient demonstrates increased fatigue at the end of the session   PT Education - 07/08/17 1421    Education provided  Yes    Education Details  HEP: tandem Designer, television/film set) Educated  Patient  Methods  Explanation;Demonstration    Comprehension  Verbalized understanding;Returned demonstration          PT Long Term Goals - 07/06/17 1247      PT LONG TERM GOAL #1   Title  Patient will be independent with HEP to continue benefits of therapy after discharge.     Baseline  Dependent with form and technique for balancing exercises    Time  4    Period  Weeks    Status  New    Target Date  08/03/17      PT LONG TERM GOAL #2   Title  Patient will improve FGA to over 26/30 to indicate functional improvement in strength and balance limitations and decrease fall risk    Baseline  22/30    Time  8    Period  Weeks    Status  New    Target Date  08/31/17      PT LONG TERM GOAL #3   Title  Patient will improve 5xSTS to under 16 seconds without use of a pillow to indicate functional improvement of LE function and decreased fall risk    Baseline  19 sec with pillow under chair    Time  8    Period  Weeks     Status  New    Target Date  08/31/17      PT LONG TERM GOAL #4   Title  Patient will be able to balance for 10sec with SLS to improve static balance and ability to get dressed when in standing    Baseline  1 sec     Time  8    Period  Weeks    Status  New    Target Date  08/31/17            Plan - 07/08/17 1424    Clinical Impression Statement  Patient demonstrates improvement in single leg stance and tandem stance in static positioning indicating functional carryover between sessions. Although patient is improving, she continues to demonstrate decreased balance most notably with dynamic positioning and will benefit from further skilled therapy to return to prior level of function.     Rehab Potential  Good    Clinical Impairments Affecting Rehab Potential  Positive: motivation; Negative: , age    PT Frequency  2x / week    PT Duration  6 weeks    PT Treatment/Interventions  ADLs/Self Care Home Management;Aquatic Therapy;Biofeedback;DME Instruction;Gait training;Stair training;Functional mobility training;Therapeutic activities;Therapeutic exercise;Balance training;Neuromuscular re-education;Patient/family education;Passive range of motion;Energy conservation;Ultrasound;Moist Heat;Iontophoresis 4mg /ml Dexamethasone;Cryotherapy;Electrical Stimulation;Manual techniques;Dry needling    PT Next Visit Plan  Progress balance and strengthening exercises,     PT Home Exercise Plan  See education section    Consulted and Agree with Plan of Care  Patient       Patient will benefit from skilled therapeutic intervention in order to improve the following deficits and impairments:  Pain, Increased fascial restricitons, Decreased coordination, Decreased mobility, Decreased activity tolerance, Decreased endurance, Decreased range of motion, Decreased strength, Hypomobility, Postural dysfunction, Abnormal gait, Decreased balance, Difficulty walking  Visit Diagnosis: Muscle weakness  (generalized)  History of falling  Unsteadiness on feet     Problem List Patient Active Problem List   Diagnosis Date Noted  . Dry eyes 07/06/2017  . Pain and swelling of lower extremity, right 03/29/2017  . Right shoulder pain 03/29/2017  . Diarrhea 05/18/2016  . Fall 05/01/2016  . Unintentional weight loss 12/30/2015  . Leg weakness, bilateral 12/30/2015  .  Hyperkalemia 01/01/2015  . Occasional numbness/prickling/tingling of fingers and toes 12/18/2014  . Routine general medical examination at a health care facility 12/13/2013  . Macrocytic anemia 05/19/2013  . 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 Psoriasis   . Osteoarthritis, multiple sites   . Episodic mood disorder ([place]) 12/01/2011  . History of breast cancer 10/28/2011  . GERD (gastroesophageal reflux disease)   . Hyperlipidemia   . Hypertension   . Osteopenia   . Carotid stenosis 05/27/2011    Blythe Stanford, PT DPT 07/08/2017, 2:32 PM  [facility] [address] [place], [address] Phone: [phone]   Fax:  [phone]  Name: [name] MRN: [id number] Date of Birth: Mar 25, 1941

## 2017-07-09 ENCOUNTER — Other Ambulatory Visit (INDEPENDENT_AMBULATORY_CARE_PROVIDER_SITE_OTHER): Payer: Medicare Other

## 2017-07-09 DIAGNOSIS — I10 Essential (primary) hypertension: Secondary | ICD-10-CM | POA: Diagnosis not present

## 2017-07-09 DIAGNOSIS — E785 Hyperlipidemia, unspecified: Secondary | ICD-10-CM

## 2017-07-09 DIAGNOSIS — D539 Nutritional anemia, unspecified: Secondary | ICD-10-CM

## 2017-07-09 LAB — CBC
HCT: 35.6 % — ABNORMAL LOW (ref 36.0–46.0)
Hemoglobin: 11.7 g/dL — ABNORMAL LOW (ref 12.0–15.0)
MCHC: 32.7 g/dL (ref 30.0–36.0)
MCV: 111 fl — ABNORMAL HIGH (ref 78.0–100.0)
Platelets: 269 10*3/uL (ref 150.0–400.0)
RBC: 3.21 Mil/uL — ABNORMAL LOW (ref 3.87–5.11)
RDW: 13.1 % (ref 11.5–15.5)
WBC: 5.6 10*3/uL (ref 4.0–10.5)

## 2017-07-09 LAB — COMPREHENSIVE METABOLIC PANEL
ALT: 10 U/L (ref 0–35)
AST: 17 U/L (ref 0–37)
Albumin: 4.2 g/dL (ref 3.5–5.2)
Alkaline Phosphatase: 77 U/L (ref 39–117)
BUN: 20 mg/dL (ref 6–23)
CO2: 28 mEq/L (ref 19–32)
Calcium: 9.6 mg/dL (ref 8.4–10.5)
Chloride: 101 mEq/L (ref 96–112)
Creatinine, Ser: 0.79 mg/dL (ref 0.40–1.20)
GFR: 74.75 mL/min (ref >60.00)
Glucose, Bld: 100 mg/dL — ABNORMAL HIGH (ref 70–99)
Potassium: 4.6 mEq/L (ref 3.5–5.1)
Sodium: 137 mEq/L (ref 135–145)
Total Bilirubin: 0.6 mg/dL (ref 0.2–1.2)
Total Protein: 7.1 g/dL (ref 6.0–8.3)

## 2017-07-09 LAB — LIPID PANEL
Cholesterol: 199 mg/dL (ref 0–200)
HDL: 90.9 mg/dL (ref >39.00)
LDL Cholesterol: 92 mg/dL (ref 0–99)
NonHDL: 107.79
Total CHOL/HDL Ratio: 2
Triglycerides: 77 mg/dL (ref 0.0–149.0)
VLDL: 15.4 mg/dL (ref 0.0–40.0)

## 2017-07-14 ENCOUNTER — Other Ambulatory Visit: Payer: Self-pay | Admitting: Family Medicine

## 2017-07-14 DIAGNOSIS — D539 Nutritional anemia, unspecified: Secondary | ICD-10-CM

## 2017-07-22 ENCOUNTER — Ambulatory Visit: Payer: Medicare Other

## 2017-07-27 ENCOUNTER — Ambulatory Visit: Payer: Medicare Other | Attending: Family Medicine

## 2017-07-27 ENCOUNTER — Ambulatory Visit: Payer: Self-pay | Admitting: *Deleted

## 2017-07-27 DIAGNOSIS — Z9181 History of falling: Secondary | ICD-10-CM | POA: Insufficient documentation

## 2017-07-27 DIAGNOSIS — M6281 Muscle weakness (generalized): Secondary | ICD-10-CM | POA: Diagnosis not present

## 2017-07-27 DIAGNOSIS — R2681 Unsteadiness on feet: Secondary | ICD-10-CM | POA: Insufficient documentation

## 2017-07-27 DIAGNOSIS — M25511 Pain in right shoulder: Secondary | ICD-10-CM | POA: Insufficient documentation

## 2017-07-27 NOTE — Telephone Encounter (Signed)
FYI

## 2017-07-27 NOTE — Telephone Encounter (Signed)
Patient is concerned about her nosebleeds- she has sporadic nosebleeds that come from nowhere. She is concerned about coming to the office because her husband is mentally disabled and it is hard for her to bring him in for a long wait. She does have an appointment on Thursday. If this needs to moved up she will need advanced notice. Encouraged moist air, saline and Vaseline for nares. Cold packs and pressure if reoccurrence. Patient to call back if she has another nose bleed before appointment. Reason for Disposition . Hard-to-stop nosebleeds are a chronic symptom (recurrent or ongoing AND present > 4 weeks)  Answer Assessment - Initial Assessment Questions 1. AMOUNT OF BLEEDING: "How bad is the bleeding?" "How much blood was lost?" "Has the bleeding stopped?"   - MILD: needed a couple tissues   - MODERATE: needed many tissues   - SEVERE: large blood clots, soaked many tissues, lasted more than 30 minutes      severe 2. ONSET: "When did the nosebleed start?"      11:00 last night 3. FREQUENCY: "How many nosebleeds have you had in the last 24 hours?"      2 in the last 24 hours- then 1 a month ago 4. RECURRENT SYMPTOMS: "Have there been other recent nosebleeds?" If so, ask: "How long did it take you to stop the bleeding?" "What worked best?"      Yes-  Over 1 hour- ice and pressure 5. CAUSE: "What do you think caused this nosebleed?"     Unknown- happens out of nowhere 6. LOCAL FACTORS: "Do you have any cold symptoms?", "Have you been rubbing or picking at your nose?"    Cold symptoms are present- but not related, dryness in nasal passages 7. SYSTEMIC FACTORS: "Do you have high blood pressure or any bleeding problems?"     Patient is on BP medications and aspirin- patient has a history of nosebleeds- started early 20's. Her father also had them 8. BLOOD THINNERS: "Do you take any blood thinners?" (e.g., coumadin, heparin, aspirin, Plavix)     asprin 9. OTHER SYMPTOMS: "Do you have any other  symptoms?" (e.g., lightheadedness)     Patient does not have any other symptoms- she is fatigued afterward 10. PREGNANCY: "Is there any chance you are pregnant?" "When was your last menstrual period?"       n/a  Protocols used: [id number]

## 2017-07-27 NOTE — Therapy (Signed)
[facility] [facility] [address], [place], [address] Phone: [phone]   Fax:  [phone]  Physical Therapy Treatment  Patient Details  Name: [name] MRN: [id number] Date of Birth: 03/28/1941 Referring Provider: Caryl Bis MD   Encounter Date: 07/27/2017  PT End of Session - 07/27/17 1207    Visit Number  3    Number of Visits  16    Date for PT Re-Evaluation  08/31/17    PT Start Time  1115    PT Stop Time  1200    PT Time Calculation (min)  45 min    Activity Tolerance  Patient tolerated treatment well    Behavior During Therapy  [facility] for tasks assessed/performed       Past Medical History:  Diagnosis Date  . Breast cancer ([place]) 2001   left breast  . Cancer ([place]) 2001   left breast ca  . GERD (gastroesophageal reflux disease)   . Hyperlipidemia   . Hypertension   . Osteoarthritis, multiple sites   . Osteoarthrosis involving, or with mention of more than one site, but not specified as generalized, multiple sites   . Osteopenia   . Personal history of radiation therapy 2001   left breast ca  . Psoriasis     Past Surgical History:  Procedure Laterality Date  . ABDOMINAL HYSTERECTOMY    . ANKLE FRACTURE SURGERY  4/08   left---hardware still in place  . 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 BREAST EXCISIONAL BIOPSY Left yrs ago   benign  . BREAST LUMPECTOMY  2001   left  . CAROTID ENDARTERECTOMY Left 10/23/2004  . FRACTURE SURGERY    . SHOULDER SURGERY  6/07   left  . TONSILLECTOMY AND ADENOIDECTOMY    . TOTAL HIP ARTHROPLASTY  2004   right    There were no vitals filed for this visit.  Subjective Assessment - 07/27/17 1203    Subjective  Patient reports increased balance difficulties most notably when walking. Patient states she has been having nose bleeds. She also reports she has a bump on her leg which she states the dr took imaging and ruled out a dvt.     Pertinent History   Patient previously seen for balance difficulties and shoulder pain and the right.  Patient reports no falls in the past six months. Patient reports pain has not improved for the past month and has been about the same since the onset of pain.     Limitations  Walking    Patient Stated Goals  To improve balance when walking     Currently in Pain?  No/denies    Pain Onset  More than a month ago        TREATMENT: Therapeutic Exercise: Nustep level 4 with use of UE for improvement in strength - 5 min  Hip abduction in standing with UE support - 2 x 15 Tandem Ambulation without UE support - 6 x 64ft Feet together balance on half foam roller - 3 x 45sec  Foot taps onto 8" platform - x20 B 4 - Square taps diagonal steps - x 20 B Single leg stance - 2 x 45sec  Patient demonstrates increased fatigue at the end of the session     PT Education - 07/27/17 1207    Education provided  Yes    Education Details  form/technique with exercise    Person(s) Educated  Patient  Methods  Explanation;Demonstration    Comprehension  Verbalized understanding;Returned demonstration          PT Long Term Goals - 07/06/17 1247      PT LONG TERM GOAL #1   Title  Patient will be independent with HEP to continue benefits of therapy after discharge.     Baseline  Dependent with form and technique for balancing exercises    Time  4    Period  Weeks    Status  New    Target Date  08/03/17      PT LONG TERM GOAL #2   Title  Patient will improve FGA to over 26/30 to indicate functional improvement in strength and balance limitations and decrease fall risk    Baseline  22/30    Time  8    Period  Weeks    Status  New    Target Date  08/31/17      PT LONG TERM GOAL #3   Title  Patient will improve 5xSTS to under 16 seconds without use of a pillow to indicate functional improvement of LE function and decreased fall risk    Baseline  19 sec with pillow under chair    Time  8    Period  Weeks     Status  New    Target Date  08/31/17      PT LONG TERM GOAL #4   Title  Patient will be able to balance for 10sec with SLS to improve static balance and ability to get dressed when in standing    Baseline  1 sec     Time  8    Period  Weeks    Status  New    Target Date  08/31/17            Plan - 07/27/17 1208    Clinical Impression Statement  Patient demonstrates decreased ability to perform tandem ambulation exercise with inability to place foot directly in front of the other foot. Patient demonstrates decreased strength with functional exercises with increased fatigue at the end of the session. Assessed mass along patient's R leg with patient experiencing increased pitting edema (over 30sec of impression) which is painful with palpation upon the lateral aspect of the mass. Patient will benefit from further skilled therapy to return to prior level of function.     Rehab Potential  Good    Clinical Impairments Affecting Rehab Potential  Positive: motivation; Negative: , age    PT Frequency  2x / week    PT Duration  6 weeks    PT Treatment/Interventions  ADLs/Self Care Home Management;Aquatic Therapy;Biofeedback;DME Instruction;Gait training;Stair training;Functional mobility training;Therapeutic activities;Therapeutic exercise;Balance training;Neuromuscular re-education;Patient/family education;Passive range of motion;Energy conservation;Ultrasound;Moist Heat;Iontophoresis 4mg /ml Dexamethasone;Cryotherapy;Electrical Stimulation;Manual techniques;Dry needling    PT Next Visit Plan  Progress balance and strengthening exercises,     PT Home Exercise Plan  See education section    Consulted and Agree with Plan of Care  Patient       Patient will benefit from skilled therapeutic intervention in order to improve the following deficits and impairments:  Pain, Increased fascial restricitons, Decreased coordination, Decreased mobility, Decreased activity tolerance, Decreased endurance,  Decreased range of motion, Decreased strength, Hypomobility, Postural dysfunction, Abnormal gait, Decreased balance, Difficulty walking  Visit Diagnosis: Muscle weakness (generalized)  History of falling  Unsteadiness on feet  Acute pain of right shoulder     Problem List Patient Active Problem List   Diagnosis Date Noted  . Dry  eyes 07/06/2017  . Pain and swelling of lower extremity, right 03/29/2017  . Right shoulder pain 03/29/2017  . Diarrhea 05/18/2016  . Fall 05/01/2016  . Unintentional weight loss 12/30/2015  . Leg weakness, bilateral 12/30/2015  . Hyperkalemia 01/01/2015  . Occasional numbness/prickling/tingling of fingers and toes 12/18/2014  . Routine general medical examination at a health care facility 12/13/2013  . Macrocytic anemia 05/19/2013  . 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 Psoriasis   . Osteoarthritis, multiple sites   . Episodic mood disorder ([place]) 12/01/2011  . History of breast cancer 10/28/2011  . GERD (gastroesophageal reflux disease)   . Hyperlipidemia   . Hypertension   . Osteopenia   . Carotid stenosis 05/27/2011    Blythe Stanford, PT DPT 07/27/2017, 12:16 PM  [place] [address], [place], [address] Phone: [phone]   Fax:  [phone]  Name: ANJELI CASAD MRN: [id number] Date of Birth: 1940-07-27

## 2017-07-27 NOTE — Telephone Encounter (Signed)
Patient should be evaluated prior to Thursday if she has light headedness, shortness of breath, fatigue, or worsening nose bleeds. If she is not able to get it to stop after 15-20 minutes with her next nose bleed she needs to be evaluated to consider treatment. Evaluation for those things would be at urgent care or ED. Thanks.

## 2017-07-27 NOTE — Telephone Encounter (Signed)
Patient is aware and agreed to comply.

## 2017-07-29 ENCOUNTER — Ambulatory Visit (INDEPENDENT_AMBULATORY_CARE_PROVIDER_SITE_OTHER): Payer: Medicare Other | Admitting: Family Medicine

## 2017-07-29 ENCOUNTER — Encounter: Payer: Self-pay | Admitting: Family Medicine

## 2017-07-29 ENCOUNTER — Ambulatory Visit (INDEPENDENT_AMBULATORY_CARE_PROVIDER_SITE_OTHER): Payer: Medicare Other

## 2017-07-29 VITALS — BP 140/90 | HR 66 | Temp 97.3°F | Wt 126.6 lb

## 2017-07-29 DIAGNOSIS — I10 Essential (primary) hypertension: Secondary | ICD-10-CM

## 2017-07-29 DIAGNOSIS — R040 Epistaxis: Secondary | ICD-10-CM | POA: Diagnosis not present

## 2017-07-29 DIAGNOSIS — M7989 Other specified soft tissue disorders: Secondary | ICD-10-CM | POA: Diagnosis not present

## 2017-07-29 LAB — CBC
HCT: 31.3 % — ABNORMAL LOW (ref 36.0–46.0)
Hemoglobin: 10.4 g/dL — ABNORMAL LOW (ref 12.0–15.0)
MCHC: 33.3 g/dL (ref 30.0–36.0)
MCV: 109.2 fl — ABNORMAL HIGH (ref 78.0–100.0)
Platelets: 271 10*3/uL (ref 150.0–400.0)
RBC: 2.87 Mil/uL — ABNORMAL LOW (ref 3.87–5.11)
RDW: 12.5 % (ref 11.5–15.5)
WBC: 10.7 10*3/uL — ABNORMAL HIGH (ref 4.0–10.5)

## 2017-07-29 NOTE — Patient Instructions (Signed)
Nice to see you. We will check a CBC and an x-ray today and contact you with the results. We will get you to see ENT though if your nosebleeds go away you can hold off on seeing them.

## 2017-07-30 NOTE — Progress Notes (Signed)
[name], MD Phone: [phone]  [name] is a 77 y.o. female who presents today for follow-up.  Patient reports intermittent epistaxis over the last month.  She will bleed fairly profusely with clots.  Will start on one side and then both her nostrils with the bleeding.  She notes no other symptoms with this.  It has gotten less heavy.  No bleeding from elsewhere.  She has had some congestion though this started after the nosebleeds.  Patient continues to have swelling in her right leg.  It appears more focal now.  She was evaluated previously with an ultrasound that was negative for DVT.  She notes some discomfort in the area.  It is tender.  It gets bigger and then gets a little smaller.  Does not extend down to her ankle.  Does not extend past her mid shin.  Has been going on for 6 months.  BP borderline elevated today.  She is not checking consistently.  She is taking metoprolol.  No chest pain or trouble breathing.  Social History   Tobacco Use  Smoking Status Former Smoker  . Packs/day: 1.00  . Years: 40.00  . Pack years: 40.00  . Types: Cigarettes  . Last attempt to quit: 07/21/1995  . Years since quitting: 22.0  Smokeless Tobacco Never Used     ROS see history of present illness  Objective  Physical Exam Vitals:   07/29/17 0828  BP: 140/90  Pulse: 66  Temp: (!) 97.3 F (36.3 C)  SpO2: 98%    BP Readings from Last 3 Encounters:  07/29/17 140/90  07/05/17 110/80  03/29/17 122/82   Wt Readings from Last 3 Encounters:  07/29/17 126 lb 9.6 oz (57.4 kg)  07/05/17 125 lb 9.6 oz (57 kg)  03/29/17 122 lb 3.2 oz (55.4 kg)    Physical Exam  Constitutional: No distress.  HENT:  Slight irritation bilateral anterior nasal mucosa, no focal areas of bleeding  Cardiovascular: Normal rate, regular rhythm and normal heart sounds.  Pulmonary/Chest: Effort normal and breath sounds normal.  Musculoskeletal: She exhibits no edema.        Legs: Neurological: She is alert. Gait normal.  Skin: Skin is warm and dry. She is not diaphoretic.     Assessment/Plan: Please see individual problem list.  Epistaxis Persistent epistaxis.  No bleeding from elsewhere.  No bleeding currently.  She is aware of appropriate measure to get her nosebleed to stop.  We will check a CBC.  Refer to ENT.  Given return precautions.  Leg swelling Area of circumferential focal swelling in her right lower leg above her ankle to her mid shin.  Swelling was previously evaluated with an ultrasound that was negative for DVT.  This has persisted.  We will check an x-ray to ensure no underlying bony lesion.  Consider referral to orthopedics versus general surgery if persists.  Hypertension Borderline control for age.  Discussed checking at home.  Continue metoprolol.   Orders Placed This Encounter  Procedures  . DG Tibia/Fibula Right    Standing Status:   Future    Number of Occurrences:   1    Standing Expiration Date:   09/27/2018    Order Specific Question:   Reason for Exam (SYMPTOM  OR DIAGNOSIS REQUIRED)    Answer:   focal swelling over the lower right leg, present for 6 months, negative venous US previously    Order Specific Question:   Preferred imaging location?    Answer:  [facility] Specific Question:   Radiology Contrast Protocol - do NOT remove file path    Answer:   \\[email or web address].pdf  . CBC  . Ambulatory referral to ENT    Referral Priority:   Routine    Referral Type:   Consultation    Referral Reason:   Specialty Services Required    Requested Specialty:   Otolaryngology    Number of Visits Requested:   1    No orders of the defined types were placed in this encounter.    [name], MD [place]

## 2017-07-31 ENCOUNTER — Other Ambulatory Visit: Payer: Self-pay | Admitting: Family Medicine

## 2017-07-31 DIAGNOSIS — D649 Anemia, unspecified: Secondary | ICD-10-CM

## 2017-07-31 DIAGNOSIS — M7989 Other specified soft tissue disorders: Secondary | ICD-10-CM | POA: Insufficient documentation

## 2017-07-31 DIAGNOSIS — R040 Epistaxis: Secondary | ICD-10-CM | POA: Insufficient documentation

## 2017-07-31 NOTE — Assessment & Plan Note (Signed)
Area of circumferential focal swelling in her right lower leg above her ankle to her mid shin.  Swelling was previously evaluated with an ultrasound that was negative for DVT.  This has persisted.  We will check an x-ray to ensure no underlying bony lesion.  Consider referral to orthopedics versus general surgery if persists.

## 2017-07-31 NOTE — Assessment & Plan Note (Signed)
Persistent epistaxis.  No bleeding from elsewhere.  No bleeding currently.  She is aware of appropriate measure to get her nosebleed to stop.  We will check a CBC.  Refer to ENT.  Given return precautions.

## 2017-07-31 NOTE — Assessment & Plan Note (Signed)
Borderline control for age.  Discussed checking at home.  Continue metoprolol.

## 2017-08-02 ENCOUNTER — Ambulatory Visit: Payer: Medicare Other

## 2017-08-04 ENCOUNTER — Ambulatory Visit: Payer: Medicare Other

## 2017-08-04 DIAGNOSIS — M25511 Pain in right shoulder: Secondary | ICD-10-CM | POA: Diagnosis not present

## 2017-08-04 DIAGNOSIS — M6281 Muscle weakness (generalized): Secondary | ICD-10-CM

## 2017-08-04 DIAGNOSIS — R2681 Unsteadiness on feet: Secondary | ICD-10-CM

## 2017-08-04 DIAGNOSIS — Z9181 History of falling: Secondary | ICD-10-CM | POA: Diagnosis not present

## 2017-08-04 NOTE — Therapy (Signed)
[place] [facility] [address], [place], [address] Phone: [phone]   Fax:  [phone]  Physical Therapy Treatment  Patient Details  Name: [name] MRN: [id number] Date of Birth: Jun 18, 1941 Referring Provider: Caryl Bis MD   Encounter Date: 08/04/2017  PT End of Session - 08/04/17 0925    Visit Number  4    Number of Visits  16    Date for PT Re-Evaluation  08/31/17    PT Start Time  0830    PT Stop Time  0913    PT Time Calculation (min)  43 min    Activity Tolerance  Patient tolerated treatment well    Behavior During Therapy  [facility] for tasks assessed/performed       Past Medical History:  Diagnosis Date  . Breast cancer ([place]) 2001   left breast  . Cancer ([place]) 2001   left breast ca  . GERD (gastroesophageal reflux disease)   . Hyperlipidemia   . Hypertension   . Osteoarthritis, multiple sites   . Osteoarthrosis involving, or with mention of more than one site, but not specified as generalized, multiple sites   . Osteopenia   . Personal history of radiation therapy 2001   left breast ca  . Psoriasis     Past Surgical History:  Procedure Laterality Date  . ABDOMINAL HYSTERECTOMY    . ANKLE FRACTURE SURGERY  4/08   left---hardware still in place  . 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 BREAST EXCISIONAL BIOPSY Left yrs ago   benign  . BREAST LUMPECTOMY  2001   left  . CAROTID ENDARTERECTOMY Left 10/23/2004  . FRACTURE SURGERY    . SHOULDER SURGERY  6/07   left  . TONSILLECTOMY AND ADENOIDECTOMY    . TOTAL HIP ARTHROPLASTY  2004   right    There were no vitals filed for this visit.  Subjective Assessment - 08/04/17 0833    Subjective  Patient reports feelings of depression. Patient reports difficulties with balance since the previous session and that she has been trying to walk more at home. Patient states she is "afraid to walk far."     Pertinent History  Patient previously  seen for balance difficulties and shoulder pain and the right.  Patient reports no falls in the past six months. Patient reports pain has not improved for the past month and has been about the same since the onset of pain.     Limitations  Walking    Patient Stated Goals  To improve balance when walking     Currently in Pain?  No/denies    Pain Onset  More than a month ago       TREATMENT:  Therapeutic Exercise:  Nustep level 5 with use of UE for improvement in strength - 5 min   Hip abduction in standing with UE support to increase LE endurance - 2x20  Tandem Ambulation without UE support for improvement in dynamic balance- 7x10 ft  Static balance on unstable foam pad surface without UE support to improve static balance - 2x 60 sec   Wide tandem stance with ball toss B - 2x15  Partial squats with UE support to increase LE strength -- 1x10   Partial squats without UE support -- 1x15   Double leg stance on wobble board without UE support to improve static balance -2x45 sec  Standing on smaller wobble board with occasional UE support - 2x30 sec  Patient demonstrates increased fatigue at end of session.      PT Education - 08/04/17 0924    Education provided  Yes    Education Details  HEP: partial squats with UE support    Person(s) Educated  Patient    Methods  Explanation;Demonstration    Comprehension  Verbalized understanding;Returned demonstration          PT Long Term Goals - 07/06/17 1247      PT LONG TERM GOAL #1   Title  Patient will be independent with HEP to continue benefits of therapy after discharge.     Baseline  Dependent with form and technique for balancing exercises    Time  4    Period  Weeks    Status  New    Target Date  08/03/17      PT LONG TERM GOAL #2   Title  Patient will improve FGA to over 26/30 to indicate functional improvement in strength and balance limitations and decrease fall risk    Baseline  22/30    Time  8     Period  Weeks    Status  New    Target Date  08/31/17      PT LONG TERM GOAL #3   Title  Patient will improve 5xSTS to under 16 seconds without use of a pillow to indicate functional improvement of LE function and decreased fall risk    Baseline  19 sec with pillow under chair    Time  8    Period  Weeks    Status  New    Target Date  08/31/17      PT LONG TERM GOAL #4   Title  Patient will be able to balance for 10sec with SLS to improve static balance and ability to get dressed when in standing    Baseline  1 sec     Time  8    Period  Weeks    Status  New    Target Date  08/31/17            Plan - 08/04/17 0926    Clinical Impression Statement  Patient demonstrates increased ability to maintain static balance without UE support on wobble board indicating increase in LE and core strength and motor control.  Patient demonstrates poor dynamic balance with postrual sway during tandem ambulation with gait belt and without UE support indicating decreased motor control and LE strength for ambulation. Patient will benefit from further skilled therapy to return to prior level of function.    Rehab Potential  Good    Clinical Impairments Affecting Rehab Potential  Positive: motivation; Negative: , age    PT Frequency  2x / week    PT Duration  6 weeks    PT Treatment/Interventions  ADLs/Self Care Home Management;Aquatic Therapy;Biofeedback;DME Instruction;Gait training;Stair training;Functional mobility training;Therapeutic activities;Therapeutic exercise;Balance training;Neuromuscular re-education;Patient/family education;Passive range of motion;Energy conservation;Ultrasound;Moist Heat;Iontophoresis 4mg /ml Dexamethasone;Cryotherapy;Electrical Stimulation;Manual techniques;Dry needling    PT Next Visit Plan  Progress balance and strengthening exercises,     PT Home Exercise Plan  See education section    Consulted and Agree with Plan of Care  Patient       Patient will benefit from  skilled therapeutic intervention in order to improve the following deficits and impairments:  Pain, Increased fascial restricitons, Decreased coordination, Decreased mobility, Decreased activity tolerance, Decreased endurance, Decreased range of motion, Decreased strength, Hypomobility, Postural dysfunction, Abnormal gait, Decreased balance, Difficulty walking  Visit Diagnosis: Unsteadiness on  feet  Muscle weakness (generalized)  History of falling     Problem List Patient Active Problem List   Diagnosis Date Noted  . Epistaxis 07/31/2017  . Leg swelling 07/31/2017  . Dry eyes 07/06/2017  . Pain and swelling of lower extremity, right 03/29/2017  . Right shoulder pain 03/29/2017  . Diarrhea 05/18/2016  . Fall 05/01/2016  . Unintentional weight loss 12/30/2015  . Leg weakness, bilateral 12/30/2015  . Hyperkalemia 01/01/2015  . Occasional numbness/prickling/tingling of fingers and toes 12/18/2014  . Routine general medical examination at a health care facility 12/13/2013  . Macrocytic anemia 05/19/2013  . 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 Psoriasis   . Osteoarthritis, multiple sites   . Episodic mood disorder ([place]) 12/01/2011  . History of breast cancer 10/28/2011  . GERD (gastroesophageal reflux disease)   . Hyperlipidemia   . Hypertension   . Osteopenia   . Carotid stenosis 05/27/2011    Ricard Dillon, SPT 08/04/2017, 9:33 AM  [facility] [address], [place], [address] Phone: [phone]   Fax:  [phone]  Name: [name] MRN: [id number] Date of Birth: 04-12-1941

## 2017-08-09 ENCOUNTER — Ambulatory Visit: Payer: Medicare Other

## 2017-08-12 ENCOUNTER — Other Ambulatory Visit: Payer: Self-pay

## 2017-08-12 ENCOUNTER — Telehealth: Payer: Self-pay | Admitting: Family Medicine

## 2017-08-12 ENCOUNTER — Ambulatory Visit: Payer: Medicare Other

## 2017-08-12 NOTE — Telephone Encounter (Signed)
Please advise 

## 2017-08-12 NOTE — Telephone Encounter (Signed)
Monday at 1115.

## 2017-08-12 NOTE — Telephone Encounter (Signed)
Please advise where to schedule.

## 2017-08-12 NOTE — Telephone Encounter (Signed)
Copied from [place] [id number]. Topic: Appointment Scheduling - Scheduling Inquiry for Clinic >> Aug 12, 2017 11:23 AM Cecelia Byars, NT wrote: Reason for CRM: This patient would like to be seen by [facility] next ,week she is having issues with a split toe nail , her husband just passed and she really wants to be seen by her pcp only if possible please advise [phone]

## 2017-08-12 NOTE — Telephone Encounter (Signed)
Copied from [place] [id number]. Topic: Appointment Scheduling - Scheduling Inquiry for Clinic >> Aug 12, 2017 11:23 AM Cecelia Byars, NT wrote: Reason for CRM: This patient would like to be seen by [facility] next ,week she is having issues with a split toe nail , her husband just passed and she really wants to be seen by her pcp only if possible please advise [phone]

## 2017-08-13 NOTE — Telephone Encounter (Signed)
Patient is scheduled with rehabilitation at 1030

## 2017-08-15 NOTE — Telephone Encounter (Signed)
Please check on a 430 appointment on Tuesday.  Thanks.

## 2017-08-16 ENCOUNTER — Ambulatory Visit: Payer: Medicare Other

## 2017-08-16 NOTE — Telephone Encounter (Signed)
Patient scheduled.

## 2017-08-17 ENCOUNTER — Ambulatory Visit (INDEPENDENT_AMBULATORY_CARE_PROVIDER_SITE_OTHER): Payer: Medicare Other | Admitting: Family Medicine

## 2017-08-17 ENCOUNTER — Ambulatory Visit (INDEPENDENT_AMBULATORY_CARE_PROVIDER_SITE_OTHER): Payer: Medicare Other

## 2017-08-17 ENCOUNTER — Encounter: Payer: Self-pay | Admitting: Family Medicine

## 2017-08-17 VITALS — BP 140/90 | HR 71 | Temp 97.4°F | Wt 123.6 lb

## 2017-08-17 DIAGNOSIS — M79675 Pain in left toe(s): Secondary | ICD-10-CM | POA: Diagnosis not present

## 2017-08-17 DIAGNOSIS — D539 Nutritional anemia, unspecified: Secondary | ICD-10-CM

## 2017-08-17 DIAGNOSIS — M79604 Pain in right leg: Secondary | ICD-10-CM | POA: Diagnosis not present

## 2017-08-17 DIAGNOSIS — F4321 Adjustment disorder with depressed mood: Secondary | ICD-10-CM | POA: Insufficient documentation

## 2017-08-17 DIAGNOSIS — M19072 Primary osteoarthritis, left ankle and foot: Secondary | ICD-10-CM | POA: Diagnosis not present

## 2017-08-17 DIAGNOSIS — M7989 Other specified soft tissue disorders: Secondary | ICD-10-CM | POA: Diagnosis not present

## 2017-08-17 NOTE — Patient Instructions (Signed)
Nice to see you. We will get you set up with podiatry. We will get an x-ray today of your toe. If you develop fevers or increasing issues with this please let us know.

## 2017-08-17 NOTE — Assessment & Plan Note (Signed)
Previously noted to be slightly worse.  She was supposed to have this repeated though did not.  She does not want to do this today.

## 2017-08-17 NOTE — Assessment & Plan Note (Signed)
I provided support.  Encouraged her to undergo grief counseling through hospice.

## 2017-08-17 NOTE — Assessment & Plan Note (Signed)
Focal swelling remains.  I have advised referral to vascular surgery to evaluate further though she defers at this time.

## 2017-08-17 NOTE — Progress Notes (Signed)
[name], MD Phone: [phone]  [name] is a 77 y.o. female who presents today for same day visit.  Patient notes for at least the last month she has had issues with her toes.  Notes discomfort in her first 3 toes.  Notes the toenails have been thickened and have split.  Notes quite a large callus on the tip of her middle left toe.  That toenail is the worst.  She has had no fevers or drainage.  Has a small cut at the base of the callus.  Does note some grief related to her husband's recent death.  No depression.  She does have some support through friends and some of her family members.  Hospice is going to come out as well.  Previously with slight anemia.  She did not come back for repeat lab work.  She does not want to do any lab work today.  She continues to have the swelling in her right leg that is relatively focal.  She underwent ultrasound as well as x-ray is.  She has been hesitant to see a specialist regarding this.  Social History   Tobacco Use  Smoking Status Former Smoker  . Packs/day: 1.00  . Years: 40.00  . Pack years: 40.00  . Types: Cigarettes  . Last attempt to quit: 07/21/1995  . Years since quitting: 22.0  Smokeless Tobacco Never Used     ROS see history of present illness  Objective  Physical Exam Vitals:   08/17/17 0932  BP: 140/90  Pulse: 71  Temp: (!) 97.4 F (36.3 C)  SpO2: 99%    BP Readings from Last 3 Encounters:  08/17/17 140/90  07/29/17 140/90  07/05/17 110/80   Wt Readings from Last 3 Encounters:  08/17/17 123 lb 9.6 oz (56.1 kg)  07/29/17 126 lb 9.6 oz (57.4 kg)  07/05/17 125 lb 9.6 oz (57 kg)    Physical Exam  Constitutional: No distress.  Pulmonary/Chest: Effort normal.  Musculoskeletal: She exhibits edema (Focal swelling again remains in the right lower shin).       Feet:  No ulcerations or signs of infection in feet, left foot appears warm and well perfused  Neurological: She is alert. Gait normal.    Skin: Skin is warm and dry. She is not diaphoretic.     Assessment/Plan: Please see individual problem list.  Pain of toe of left foot Patient with pain in her toes in her left feet for at least a month now.  She has a fairly significant callus over her left third toe tip.  Small cut noted.  No signs of infection.  No systemic signs of infection.  Unlikely any underlying osteomyelitis or infection.  Given the level of discomfort in the toe we will x-ray the toe.  Will refer to podiatry.  Discussed keeping it covered as she appears to have been walking around with no socks on in her shoes.  Discussed signs of infection to monitor for.  Pain and swelling of lower extremity, right Focal swelling remains.  I have advised referral to vascular surgery to evaluate further though she defers at this time.  Grief I provided support.  Encouraged her to undergo grief counseling through hospice.  Macrocytic anemia Previously noted to be slightly worse.  She was supposed to have this repeated though did not.  She does not want to do this today.   Orders Placed This Encounter  Procedures  . DG Toe 3rd Left    Standing Status:  Future    Number of Occurrences:   1    Standing Expiration Date:   10/16/2018    Order Specific Question:   Reason for Exam (SYMPTOM  OR DIAGNOSIS REQUIRED)    Answer:   left 3rd toe pain, callus formation at tip, small cut noted, no bone felt on probing, issue present for at least 1 month    Order Specific Question:   Preferred imaging location?    Answer:   [facility] Specific Question:   Radiology Contrast Protocol - do NOT remove file path    Answer:   \\[email or web address].pdf  . Ambulatory referral to Podiatry    Referral Priority:   Routine    Referral Type:   Consultation    Referral Reason:   Specialty Services Required    Requested Specialty:   Podiatry    Number of Visits Requested:   1    No orders  of the defined types were placed in this encounter.    [name], MD [place]

## 2017-08-17 NOTE — Assessment & Plan Note (Signed)
Patient with pain in her toes in her left feet for at least a month now.  She has a fairly significant callus over her left third toe tip.  Small cut noted.  No signs of infection.  No systemic signs of infection.  Unlikely any underlying osteomyelitis or infection.  Given the level of discomfort in the toe we will x-ray the toe.  Will refer to podiatry.  Discussed keeping it covered as she appears to have been walking around with no socks on in her shoes.  Discussed signs of infection to monitor for.

## 2017-08-18 NOTE — Telephone Encounter (Signed)
Patient called for information about her referral.  She was told about the 08/24/17 appt at 1:30pm with the [facility] in [place].  She was also given the address and telephone number, but she sees several appt and don't understand.  She would like a return call.

## 2017-08-18 NOTE — Progress Notes (Signed)
Patient states she just wants her foot taken care of now and will call back later to schedule lab

## 2017-08-19 ENCOUNTER — Ambulatory Visit: Payer: Medicare Other

## 2017-08-19 DIAGNOSIS — M25511 Pain in right shoulder: Secondary | ICD-10-CM | POA: Diagnosis not present

## 2017-08-19 DIAGNOSIS — R2681 Unsteadiness on feet: Secondary | ICD-10-CM | POA: Diagnosis not present

## 2017-08-19 DIAGNOSIS — Z9181 History of falling: Secondary | ICD-10-CM | POA: Diagnosis not present

## 2017-08-19 DIAGNOSIS — M6281 Muscle weakness (generalized): Secondary | ICD-10-CM

## 2017-08-19 NOTE — Therapy (Signed)
[facility] [address], [place], [address] Phone: [phone]   Fax:  [phone]  Physical Therapy Treatment  Patient Details  Name: [name] MRN: [id number] Date of Birth: April 03, 1941 Referring Provider: Caryl Bis MD   Encounter Date: 08/19/2017  PT End of Session - 08/19/17 1100    Visit Number  5    Number of Visits  16    Date for PT Re-Evaluation  08/31/17    PT Start Time  7106    PT Stop Time  1115    PT Time Calculation (min)  35 min    Activity Tolerance  Patient tolerated treatment well    Behavior During Therapy  [facility] for tasks assessed/performed       Past Medical History:  Diagnosis Date  . Breast cancer ([place]) 2001   left breast  . Cancer ([place]) 2001   left breast ca  . GERD (gastroesophageal reflux disease)   . Hyperlipidemia   . Hypertension   . Osteoarthritis, multiple sites   . Osteoarthrosis involving, or with mention of more than one site, but not specified as generalized, multiple sites   . Osteopenia   . Personal history of radiation therapy 2001   left breast ca  . Psoriasis     Past Surgical History:  Procedure Laterality Date  . ABDOMINAL HYSTERECTOMY    . ANKLE FRACTURE SURGERY  4/08   left---hardware still in place  . 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 BREAST EXCISIONAL BIOPSY Left yrs ago   benign  . BREAST LUMPECTOMY  2001   left  . CAROTID ENDARTERECTOMY Left 10/23/2004  . FRACTURE SURGERY    . SHOULDER SURGERY  6/07   left  . TONSILLECTOMY AND ADENOIDECTOMY    . TOTAL HIP ARTHROPLASTY  2004   right    There were no vitals filed for this visit.  Subjective Assessment - 08/19/17 1044    Subjective  Pt reports that she is doing alright today. She is feeling somewhat emotional today due to the recent passing of her husband. She denies any pain today. Reports persistent swelling in RLE which MD has been following.      Pertinent History   Patient previously seen for balance difficulties and shoulder pain and the right.  Patient reports no falls in the past six months. Patient reports pain has not improved for the past month and has been about the same since the onset of pain.     Limitations  Walking    Patient Stated Goals  To improve balance when walking     Currently in Pain?  No/denies    Pain Onset  --         TREATMENT:  Ther-ex  Nustep level 4 with use of UE for improvement in strength during history x 5 minutes; Hip abduction in standing with UE support to increase LE endurance 2 x 20 Partial squats with UE support to increase LE strength x 10; Partial squats without UE support x 10;  Neuromuscular Re-education Tandem Ambulation without UE support for improvement in dynamic balance 10' x 2; Airex balance with feet together eyes open/closed 30s x multiple bouts; Airex balance with toe taps alternating LE x 10 each; Double leg stance on wobble board without UE support to improve static balance in A/P and R/L directions 60s x multiple bouts;  Patient demonstrates increased fatigue at end of session.  PT Education - 08/19/17 1100    Education provided  Yes    Education Details  exercise form/technique    Person(s) Educated  Patient    Methods  Explanation;Demonstration    Comprehension  Verbalized understanding          PT Long Term Goals - 07/06/17 1247      PT LONG TERM GOAL #1   Title  Patient will be independent with HEP to continue benefits of therapy after discharge.     Baseline  Dependent with form and technique for balancing exercises    Time  4    Period  Weeks    Status  New    Target Date  08/03/17      PT LONG TERM GOAL #2   Title  Patient will improve FGA to over 26/30 to indicate functional improvement in strength and balance limitations and decrease fall risk    Baseline  22/30    Time  8    Period  Weeks    Status  New    Target Date   08/31/17      PT LONG TERM GOAL #3   Title  Patient will improve 5xSTS to under 16 seconds without use of a pillow to indicate functional improvement of LE function and decreased fall risk    Baseline  19 sec with pillow under chair    Time  8    Period  Weeks    Status  New    Target Date  08/31/17      PT LONG TERM GOAL #4   Title  Patient will be able to balance for 10sec with SLS to improve static balance and ability to get dressed when in standing    Baseline  1 sec     Time  8    Period  Weeks    Status  New    Target Date  08/31/17            Plan - 08/19/17 1120    Clinical Impression Statement  Pt demonstrates good motivation wih therapy today. She has definately improved her balance on unstable surfaces as well as eyes closed balance. Pt encouraged to continue HEP and follow-up as scheduled.     Rehab Potential  Good    Clinical Impairments Affecting Rehab Potential  Positive: motivation; Negative: , age    PT Frequency  2x / week    PT Duration  6 weeks    PT Treatment/Interventions  ADLs/Self Care Home Management;Aquatic Therapy;Biofeedback;DME Instruction;Gait training;Stair training;Functional mobility training;Therapeutic activities;Therapeutic exercise;Balance training;Neuromuscular re-education;Patient/family education;Passive range of motion;Energy conservation;Ultrasound;Moist Heat;Iontophoresis 4mg /ml Dexamethasone;Cryotherapy;Electrical Stimulation;Manual techniques;Dry needling    PT Next Visit Plan  Progress balance and strengthening exercises,     PT Home Exercise Plan  See education section    Consulted and Agree with Plan of Care  Patient       Patient will benefit from skilled therapeutic intervention in order to improve the following deficits and impairments:  Pain, Increased fascial restricitons, Decreased coordination, Decreased mobility, Decreased activity tolerance, Decreased endurance, Decreased range of motion, Decreased strength, Hypomobility,  Postural dysfunction, Abnormal gait, Decreased balance, Difficulty walking  Visit Diagnosis: Unsteadiness on feet  Muscle weakness (generalized)     Problem List Patient Active Problem List   Diagnosis Date Noted  . Pain of toe of left foot 08/17/2017  . Grief 08/17/2017  . Epistaxis 07/31/2017  . Leg swelling 07/31/2017  . Dry eyes 07/06/2017  . Pain and swelling  of lower extremity, right 03/29/2017  . Right shoulder pain 03/29/2017  . Diarrhea 05/18/2016  . Fall 05/01/2016  . Unintentional weight loss 12/30/2015  . Leg weakness, bilateral 12/30/2015  . Hyperkalemia 01/01/2015  . Occasional numbness/prickling/tingling of fingers and toes 12/18/2014  . Routine general medical examination at a health care facility 12/13/2013  . Macrocytic anemia 05/19/2013  . 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 Psoriasis   . Osteoarthritis, multiple sites   . Episodic mood disorder ([place]) 12/01/2011  . History of breast cancer 10/28/2011  . GERD (gastroesophageal reflux disease)   . Hyperlipidemia   . Hypertension   . Osteopenia   . Carotid stenosis 05/27/2011   Phillips Grout PT, DPT   Taija Mathias 08/19/2017, 11:22 AM  [facility] [address], [place], [address] Phone: [phone]   Fax:  [phone]  Name: SKAI LICKTEIG MRN: [id number] Date of Birth: 06/28/1941

## 2017-08-19 NOTE — Telephone Encounter (Signed)
Patient notified of appointment.  

## 2017-08-19 NOTE — Telephone Encounter (Signed)
Ok. Thank you.

## 2017-08-19 NOTE — Telephone Encounter (Signed)
Please advise 

## 2017-08-21 ENCOUNTER — Other Ambulatory Visit: Payer: Self-pay | Admitting: Family Medicine

## 2017-08-24 ENCOUNTER — Ambulatory Visit: Payer: Medicare Other | Attending: Family Medicine

## 2017-08-24 ENCOUNTER — Encounter: Payer: Self-pay | Admitting: Podiatry

## 2017-08-24 ENCOUNTER — Ambulatory Visit (INDEPENDENT_AMBULATORY_CARE_PROVIDER_SITE_OTHER): Payer: Medicare Other | Admitting: Podiatry

## 2017-08-24 DIAGNOSIS — M6281 Muscle weakness (generalized): Secondary | ICD-10-CM | POA: Diagnosis not present

## 2017-08-24 DIAGNOSIS — M79676 Pain in unspecified toe(s): Secondary | ICD-10-CM | POA: Diagnosis not present

## 2017-08-24 DIAGNOSIS — I83025 Varicose veins of left lower extremity with ulcer other part of foot: Secondary | ICD-10-CM

## 2017-08-24 DIAGNOSIS — Z9181 History of falling: Secondary | ICD-10-CM

## 2017-08-24 DIAGNOSIS — L97522 Non-pressure chronic ulcer of other part of left foot with fat layer exposed: Secondary | ICD-10-CM | POA: Diagnosis not present

## 2017-08-24 DIAGNOSIS — L97529 Non-pressure chronic ulcer of other part of left foot with unspecified severity: Secondary | ICD-10-CM | POA: Diagnosis not present

## 2017-08-24 DIAGNOSIS — B351 Tinea unguium: Secondary | ICD-10-CM

## 2017-08-24 DIAGNOSIS — R2681 Unsteadiness on feet: Secondary | ICD-10-CM | POA: Insufficient documentation

## 2017-08-24 MED ORDER — GENTAMICIN SULFATE 0.1 % EX CREA: 1.0000 "application " | TOPICAL_CREAM | Freq: Three times a day (TID) | CUTANEOUS | 1 refills | Status: DC

## 2017-08-24 NOTE — Progress Notes (Signed)
   Subjective:    Patient ID: [name], female    DOB: Mar 22, 1941, 77 y.o.   MRN: [id number]  HPI    Review of Systems  All other systems reviewed and are negative.      Objective:   Physical Exam        Assessment & Plan:

## 2017-08-24 NOTE — Therapy (Signed)
[facility] [address], [place], [address] Phone: [phone]   Fax:  [phone]  Physical Therapy Treatment  Patient Details  Name: [name] MRN: [id number] Date of Birth: 28-Apr-1941 Referring Provider: Caryl Bis MD   Encounter Date: 08/24/2017  PT End of Session - 08/24/17 0957    Visit Number  6    Number of Visits  16    Date for PT Re-Evaluation  08/31/17    PT Start Time  0900    PT Stop Time  0945    PT Time Calculation (min)  45 min    Activity Tolerance  Patient tolerated treatment well    Behavior During Therapy  [facility] for tasks assessed/performed       Past Medical History:  Diagnosis Date  . Breast cancer ([place]) 2001   left breast  . Cancer ([place]) 2001   left breast ca  . GERD (gastroesophageal reflux disease)   . Hyperlipidemia   . Hypertension   . Osteoarthritis, multiple sites   . Osteoarthrosis involving, or with mention of more than one site, but not specified as generalized, multiple sites   . Osteopenia   . Personal history of radiation therapy 2001   left breast ca  . Psoriasis     Past Surgical History:  Procedure Laterality Date  . ABDOMINAL HYSTERECTOMY    . ANKLE FRACTURE SURGERY  4/08   left---hardware still in place  . 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 BREAST EXCISIONAL BIOPSY Left yrs ago   benign  . BREAST LUMPECTOMY  2001   left  . CAROTID ENDARTERECTOMY Left 10/23/2004  . FRACTURE SURGERY    . SHOULDER SURGERY  6/07   left  . TONSILLECTOMY AND ADENOIDECTOMY    . TOTAL HIP ARTHROPLASTY  2004   right    There were no vitals filed for this visit.  Subjective Assessment - 08/24/17 0910    Subjective  Pt reports she is feeling upset today due to the recent passing of her husband. Pt reports she is seeing her physician for her R LE.    Pertinent History  Patient previously seen for balance difficulties and shoulder pain and the right.  Patient  reports no falls in the past six months. Patient reports pain has not improved for the past month and has been about the same since the onset of pain.     Limitations  Walking    Patient Stated Goals  To improve balance when walking     Currently in Pain?  No/denies        Treatment:  Double leg stance on wobble board with intermittent UE support - x3 min to improve static balance Tandem stance heel rocks with intermittent UE support - 1x15 B to improve dynamic  and LE strength Monster walks with YTB - 6x28ft to increase strength and endurance of the hip musculature TRX squats - 1x25 to increase LE strength bilaterally Small wobble board  R/L with intermittent UE support - 1x35 to increase dynamic balance  Orange cone toe taps while standing on airex pad -  1x25 to increase dynamic balance   Patient demonstrates increased fatigue with exercise at end of session.    PT Education - 08/24/17 0954    Education provided  Yes    Education Details  exercise form/technique    Person(s) Educated  Patient    Methods  Explanation;Demonstration    Comprehension  Verbalized understanding;Returned demonstration          PT Long Term Goals - 07/06/17 1247      PT LONG TERM GOAL #1   Title  Patient will be independent with HEP to continue benefits of therapy after discharge.     Baseline  Dependent with form and technique for balancing exercises    Time  4    Period  Weeks    Status  New    Target Date  08/03/17      PT LONG TERM GOAL #2   Title  Patient will improve FGA to over 26/30 to indicate functional improvement in strength and balance limitations and decrease fall risk    Baseline  22/30    Time  8    Period  Weeks    Status  New    Target Date  08/31/17      PT LONG TERM GOAL #3   Title  Patient will improve 5xSTS to under 16 seconds without use of a pillow to indicate functional improvement of LE function and decreased fall risk    Baseline  19 sec with pillow under  chair    Time  8    Period  Weeks    Status  New    Target Date  08/31/17      PT LONG TERM GOAL #4   Title  Patient will be able to balance for 10sec with SLS to improve static balance and ability to get dressed when in standing    Baseline  1 sec     Time  8    Period  Weeks    Status  New    Target Date  08/31/17            Plan - 08/24/17 0867    Clinical Impression Statement  Pt continues to demonstrate good motivation with therapy today.  Patient continues to require UE support to maintain stability in tandem stance heel rocks and demonstrates decreased plantarflexion, indicating poor dynamic balance and weakness of the plantar flexors.  Patient will benefit from further skilled therapy to return to prior levels of function.    Rehab Potential  Good    Clinical Impairments Affecting Rehab Potential  Positive: motivation; Negative: , age    PT Frequency  2x / week    PT Duration  6 weeks    PT Treatment/Interventions  ADLs/Self Care Home Management;Aquatic Therapy;Biofeedback;DME Instruction;Gait training;Stair training;Functional mobility training;Therapeutic activities;Therapeutic exercise;Balance training;Neuromuscular re-education;Patient/family education;Passive range of motion;Energy conservation;Ultrasound;Moist Heat;Iontophoresis 4mg /ml Dexamethasone;Cryotherapy;Electrical Stimulation;Manual techniques;Dry needling    PT Next Visit Plan  Progress balance and strengthening exercises,     PT Home Exercise Plan  See education section    Consulted and Agree with Plan of Care  Patient       Patient will benefit from skilled therapeutic intervention in order to improve the following deficits and impairments:  Pain, Increased fascial restricitons, Decreased coordination, Decreased mobility, Decreased activity tolerance, Decreased endurance, Decreased range of motion, Decreased strength, Hypomobility, Postural dysfunction, Abnormal gait, Decreased balance, Difficulty  walking  Visit Diagnosis: Unsteadiness on feet  Muscle weakness (generalized)  History of falling     Problem List Patient Active Problem List   Diagnosis Date Noted  . Pain of toe of left foot 08/17/2017  . Grief 08/17/2017  . Epistaxis 07/31/2017  . Leg swelling 07/31/2017  . Dry eyes 07/06/2017  . Pain and swelling of lower extremity, right 03/29/2017  . Right shoulder pain 03/29/2017  .  Diarrhea 05/18/2016  . Fall 05/01/2016  . Unintentional weight loss 12/30/2015  . Leg weakness, bilateral 12/30/2015  . Hyperkalemia 01/01/2015  . Occasional numbness/prickling/tingling of fingers and toes 12/18/2014  . Routine general medical examination at a health care facility 12/13/2013  . Macrocytic anemia 05/19/2013  . 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 Psoriasis   . Osteoarthritis, multiple sites   . Episodic mood disorder ([place]) 12/01/2011  . History of breast cancer 10/28/2011  . GERD (gastroesophageal reflux disease)   . Hyperlipidemia   . Hypertension   . Osteopenia   . Carotid stenosis 05/27/2011    Ricard Dillon, SPT 08/24/2017, 10:10 AM  [facility] [address] [place], [address] Phone: [phone]   Fax:  [phone]  Name: [name] MRN: [id number] Date of Birth: 1940/09/30

## 2017-08-26 ENCOUNTER — Ambulatory Visit: Payer: Medicare Other

## 2017-08-26 DIAGNOSIS — M6281 Muscle weakness (generalized): Secondary | ICD-10-CM

## 2017-08-26 DIAGNOSIS — R2681 Unsteadiness on feet: Secondary | ICD-10-CM | POA: Diagnosis not present

## 2017-08-26 DIAGNOSIS — Z9181 History of falling: Secondary | ICD-10-CM | POA: Diagnosis not present

## 2017-08-26 NOTE — Progress Notes (Signed)
   Subjective:  77 year old female presenting today as a new patient with intermittent aching and throbbing to the third left toe that began 4-6 weeks ago. She reports a malodorous wound to the third toe. She has not done anything to treat the area. There are no modifying factors noted.  She also reports thickened, elongated and tender nails of bilateral feet. She reports pain while ambulating while wearing shoes. She is unable to trim her own nails. Patient is here for further evaluation and treatment.   Past Medical History:  Diagnosis Date  . Breast cancer ([place]) 2001   left breast  . Cancer ([place]) 2001   left breast ca  . GERD (gastroesophageal reflux disease)   . Hyperlipidemia   . Hypertension   . Osteoarthritis, multiple sites   . Osteoarthrosis involving, or with mention of more than one site, but not specified as generalized, multiple sites   . Osteopenia   . Personal history of radiation therapy 2001   left breast ca  . Psoriasis      Objective/Physical Exam General: The patient is alert and oriented x3 in no acute distress.  Dermatology:  Wound #1 noted to the left third toe measuring 0.3 x 0.3 x 0.1 cm (LxWxD).   To the noted ulceration(s), there is no eschar. There is a moderate amount of slough, fibrin, and necrotic tissue noted. Granulation tissue and wound base is red. There is a minimal amount of serosanguineous drainage noted. There is no exposed bone muscle-tendon ligament or joint. There is no malodor. Periwound integrity is intact. Skin is warm, dry and supple bilateral lower extremities. Nails are tender, long, thickened and dystrophic with subungual debris, consistent with onychomycosis, 1-5 bilateral.   Vascular: Palpable pedal pulses bilaterally. Mild edema noted. Capillary refill within normal limits. Varicosities noted bilateral lower extremities.   Neurological: Epicritic and protective threshold absent bilaterally.   Musculoskeletal Exam: Range of  motion within normal limits to all pedal and ankle joints bilateral. Muscle strength 5/5 in all groups bilateral.   Assessment: #1 ulceration left third toe secondary to venous insufficiency #2 varicosities bilateral lower extremities #3 Onychodystrophic nails 1-5 bilateral with hyperkeratosis of nails.  #4 Onychomycosis of nail due to dermatophyte bilateral #5 Pain in foot bilateral   Plan of Care:  #1 Patient was evaluated. #2 medically necessary excisional debridement including subcutaneous tissue was performed using a tissue nipper and a chisel blade. Excisional debridement of all the necrotic nonviable tissue down to healthy bleeding viable tissue was performed with post-debridement measurements same as pre-. #3 the wound was cleansed with normal saline. #4 Prescription for gentamicin cream to be used daily with a Band-Aid. #5 Toe crest pads dispensed.  #6 Mechanical debridement of nails 1-5 bilaterally performed using a nail nipper. Filed with dremel without incident.  #7 Return to clinic in 3 weeks.   Husband passed away two weeks ago. Married 59 years.    Edrick Kins, DPM [facility]  Dr. Edrick Kins, [address]                                        [place], [place] [address]                Office [phone]  Fax [phone]

## 2017-08-26 NOTE — Therapy (Signed)
[facility] [address] [place], [address] Phone: [phone]   Fax:  [phone]  Physical Therapy Treatment  Patient Details  Name: [name] MRN: [id number] Date of Birth: 04/06/1941 Referring Provider: Caryl Bis MD   Encounter Date: 08/26/2017  PT End of Session - 08/26/17 1526    Visit Number  7    Number of Visits  16    Date for PT Re-Evaluation  08/31/17    PT Start Time  1430    PT Stop Time  1515    PT Time Calculation (min)  45 min    Activity Tolerance  Patient tolerated treatment well    Behavior During Therapy  [facility] for tasks assessed/performed       Past Medical History:  Diagnosis Date  . Breast cancer ([place]) 2001   left breast  . Cancer ([place]) 2001   left breast ca  . GERD (gastroesophageal reflux disease)   . Hyperlipidemia   . Hypertension   . Osteoarthritis, multiple sites   . Osteoarthrosis involving, or with mention of more than one site, but not specified as generalized, multiple sites   . Osteopenia   . Personal history of radiation therapy 2001   left breast ca  . Psoriasis     Past Surgical History:  Procedure Laterality Date  . ABDOMINAL HYSTERECTOMY    . ANKLE FRACTURE SURGERY  4/08   left---hardware still in place  . 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 BREAST EXCISIONAL BIOPSY Left yrs ago   benign  . BREAST LUMPECTOMY  2001   left  . CAROTID ENDARTERECTOMY Left 10/23/2004  . FRACTURE SURGERY    . SHOULDER SURGERY  6/07   left  . TONSILLECTOMY AND ADENOIDECTOMY    . TOTAL HIP ARTHROPLASTY  2004   right   Treatment-   Subjective Assessment - 08/26/17 1649    Subjective  Pt reports no significant changes since previous session. Pt reports she has pain in R first toe and saw physician for it.    Pertinent History  Patient previously seen for balance difficulties and shoulder pain and the right.  Patient reports no falls in the past six months.  Patient reports pain has not improved for the past month and has been about the same since the onset of pain.     Limitations  Walking    Patient Stated Goals  To improve balance when walking     Currently in Pain?  No/denies      TREATMENT Therapeutic exercise -   Nustep level 4 -5:00 min to increase LE strength and endurance Side stepping in ladder -- x8 to increase dynamic balance Airex pad tandem stance - B x90 sec to increase static balance Sit to stands - 1x15 to increase LE strength and endurance Airex beam side stepping - 1x15 to increase dynamic balance Heel rocks on half foam roller -- x 4 min to increase dynamic balance Single leg stance -- 2x30 sec bilaterally to increase strength of the LEs and static balance Standing marches - 1x40 to increase dynamic balance  Heel raises - 1x10 - to increase strength of the plantarflexors bilaterally   Patient demonstrates increased fatigue with exercise at end of session.     PT Education - 08/26/17 1525    Education provided  Yes    Education Details  form/technique with exercise    Person(s) Educated  Patient    Methods  Explanation;Demonstration    Comprehension  Verbalized understanding;Returned demonstration          PT Long Term Goals - 07/06/17 1247      PT LONG TERM GOAL #1   Title  Patient will be independent with HEP to continue benefits of therapy after discharge.     Baseline  Dependent with form and technique for balancing exercises    Time  4    Period  Weeks    Status  New    Target Date  08/03/17      PT LONG TERM GOAL #2   Title  Patient will improve FGA to over 26/30 to indicate functional improvement in strength and balance limitations and decrease fall risk    Baseline  22/30    Time  8    Period  Weeks    Status  New    Target Date  08/31/17      PT LONG TERM GOAL #3   Title  Patient will improve 5xSTS to under 16 seconds without use of a pillow to indicate functional improvement of LE  function and decreased fall risk    Baseline  19 sec with pillow under chair    Time  8    Period  Weeks    Status  New    Target Date  08/31/17      PT LONG TERM GOAL #4   Title  Patient will be able to balance for 10sec with SLS to improve static balance and ability to get dressed when in standing    Baseline  1 sec     Time  8    Period  Weeks    Status  New    Target Date  08/31/17            Plan - 08/26/17 1526    Clinical Impression Statement  Pt demonstrates improvement with increased speed of sit to stand compared to previous session, indicating functional carryover between sessions. Although patient demonstrates improvement, patient continues to demonstrate difficulty with maintaining stability on LEs during single leg stance, indicating decreased static balance. Patient will benefit from further skilled therapy to return to prior levels of function.    Rehab Potential  Good    Clinical Impairments Affecting Rehab Potential  Positive: motivation; Negative: , age    PT Frequency  2x / week    PT Duration  6 weeks    PT Treatment/Interventions  ADLs/Self Care Home Management;Aquatic Therapy;Biofeedback;DME Instruction;Gait training;Stair training;Functional mobility training;Therapeutic activities;Therapeutic exercise;Balance training;Neuromuscular re-education;Patient/family education;Passive range of motion;Energy conservation;Ultrasound;Moist Heat;Iontophoresis 4mg /ml Dexamethasone;Cryotherapy;Electrical Stimulation;Manual techniques;Dry needling    PT Next Visit Plan  Progress balance and strengthening exercises,     PT Home Exercise Plan  See education section    Consulted and Agree with Plan of Care  Patient       Patient will benefit from skilled therapeutic intervention in order to improve the following deficits and impairments:  Pain, Increased fascial restricitons, Decreased coordination, Decreased mobility, Decreased activity tolerance, Decreased endurance,  Decreased range of motion, Decreased strength, Hypomobility, Postural dysfunction, Abnormal gait, Decreased balance, Difficulty walking  Visit Diagnosis: Unsteadiness on feet  Muscle weakness (generalized)     Problem List Patient Active Problem List   Diagnosis Date Noted  . Pain of toe of left foot 08/17/2017  . Grief 08/17/2017  . Epistaxis 07/31/2017  . Leg swelling 07/31/2017  . Dry eyes 07/06/2017  . Pain and swelling of lower extremity, right 03/29/2017  . Right  shoulder pain 03/29/2017  . Diarrhea 05/18/2016  . Fall 05/01/2016  . Unintentional weight loss 12/30/2015  . Leg weakness, bilateral 12/30/2015  . Hyperkalemia 01/01/2015  . Occasional numbness/prickling/tingling of fingers and toes 12/18/2014  . Routine general medical examination at a health care facility 12/13/2013  . Macrocytic anemia 05/19/2013  . 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 Psoriasis   . Osteoarthritis, multiple sites   . Episodic mood disorder ([place]) 12/01/2011  . History of breast cancer 10/28/2011  . GERD (gastroesophageal reflux disease)   . Hyperlipidemia   . Hypertension   . Osteopenia   . Carotid stenosis 05/27/2011    Ricard Dillon, SPT 08/26/2017, 4:59 PM  [facility] [address], [place], [address] Phone: [phone]   Fax:  [phone]  Name: ANAIRA SEAY MRN: [id number] Date of Birth: 1940/10/22

## 2017-08-30 ENCOUNTER — Ambulatory Visit: Payer: Medicare Other

## 2017-08-30 DIAGNOSIS — R2681 Unsteadiness on feet: Secondary | ICD-10-CM | POA: Diagnosis not present

## 2017-08-30 DIAGNOSIS — Z9181 History of falling: Secondary | ICD-10-CM

## 2017-08-30 DIAGNOSIS — M6281 Muscle weakness (generalized): Secondary | ICD-10-CM | POA: Diagnosis not present

## 2017-08-30 NOTE — Therapy (Signed)
[facility] [address] [place], [address] Phone: [phone]   Fax:  [phone]  Physical Therapy Treatment  Patient Details  Name: [name] MRN: [id number] Date of Birth: [birth year] Referring Provider: Caryl Bis MD   Encounter Date: 08/30/2017  PT End of Session - 08/30/17 1152    Visit Number  8    Number of Visits  16    Date for PT Re-Evaluation  08/31/17    PT Start Time  1130    PT Stop Time  1200    PT Time Calculation (min)  30 min    Activity Tolerance  Patient tolerated treatment well    Behavior During Therapy  [facility] for tasks assessed/performed       Past Medical History:  Diagnosis Date  . Breast cancer ([place]) 2001   left breast  . Cancer ([place]) 2001   left breast ca  . GERD (gastroesophageal reflux disease)   . Hyperlipidemia   . Hypertension   . Osteoarthritis, multiple sites   . Osteoarthrosis involving, or with mention of more than one site, but not specified as generalized, multiple sites   . Osteopenia   . Personal history of radiation therapy 2001   left breast ca  . Psoriasis     Past Surgical History:  Procedure Laterality Date  . ABDOMINAL HYSTERECTOMY    . ANKLE FRACTURE SURGERY  4/08   left---hardware still in place  . 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 BREAST EXCISIONAL BIOPSY Left yrs ago   benign  . BREAST LUMPECTOMY  2001   left  . CAROTID ENDARTERECTOMY Left 10/23/2004  . FRACTURE SURGERY    . SHOULDER SURGERY  6/07   left  . TONSILLECTOMY AND ADENOIDECTOMY    . TOTAL HIP ARTHROPLASTY  2004   right    There were no vitals filed for this visit.  Subjective Assessment - 08/30/17 1142    Subjective  Pt reports she continues to go through the griefing process and reports    Pertinent History  Patient previously seen for balance difficulties and shoulder pain and the right.  Patient reports no falls in the past six months. Patient reports  pain has not improved for the past month and has been about the same since the onset of pain.     Limitations  Walking    Patient Stated Goals  To improve balance when walking     Currently in Pain?  No/denies         TREATMENT: Therapeutic Exercise: Deadlift in standing - x20 without UE support Squats in standing - x 25 without UE support Single leg stance in standing - 1.5 min with intermittent UE support Standing on large dynadisc - x1.12min Hip hiking off of step - x 20 with UE support Tandem ambulation - 6 x 36ft without UE support  Patient demonstrates increased fatigue at the end of the session  * Analyzed lower leg: patient demonstrates pitting edema ~20-30 sec noted: educated patient to follow-up with cardiovascular MD     PT Education - 08/30/17 1152    Education provided  Yes    Education Details  form/technique with exercise    Person(s) Educated  Patient    Methods  Explanation;Demonstration    Comprehension  Verbalized understanding;Returned demonstration          PT Long Term Goals - 07/06/17 1247      PT LONG TERM GOAL #  1   Title  Patient will be independent with HEP to continue benefits of therapy after discharge.     Baseline  Dependent with form and technique for balancing exercises    Time  4    Period  Weeks    Status  New    Target Date  08/03/17      PT LONG TERM GOAL #2   Title  Patient will improve FGA to over 26/30 to indicate functional improvement in strength and balance limitations and decrease fall risk    Baseline  22/30    Time  8    Period  Weeks    Status  New    Target Date  08/31/17      PT LONG TERM GOAL #3   Title  Patient will improve 5xSTS to under 16 seconds without use of a pillow to indicate functional improvement of LE function and decreased fall risk    Baseline  19 sec with pillow under chair    Time  8    Period  Weeks    Status  New    Target Date  08/31/17      PT LONG TERM GOAL #4   Title  Patient will be  able to balance for 10sec with SLS to improve static balance and ability to get dressed when in standing    Baseline  1 sec     Time  8    Period  Weeks    Status  New    Target Date  08/31/17            Plan - 08/30/17 1154    Clinical Impression Statement  Patient demonstrates improvement with tandem ambulation with less postural sway compared to previous sessions. Although patient is improving, she continues to demonstrate decreased balance especially over uneven surfaces indicating increased fall risk with ambulation over grass and gravel. Patient will benefit from further skilled therapy to return to prior level of function.     Rehab Potential  Good    Clinical Impairments Affecting Rehab Potential  Positive: motivation; Negative: , age    PT Frequency  2x / week    PT Duration  6 weeks    PT Treatment/Interventions  ADLs/Self Care Home Management;Aquatic Therapy;Biofeedback;DME Instruction;Gait training;Stair training;Functional mobility training;Therapeutic activities;Therapeutic exercise;Balance training;Neuromuscular re-education;Patient/family education;Passive range of motion;Energy conservation;Ultrasound;Moist Heat;Iontophoresis 4mg /ml Dexamethasone;Cryotherapy;Electrical Stimulation;Manual techniques;Dry needling    PT Next Visit Plan  Progress balance and strengthening exercises,     PT Home Exercise Plan  See education section    Consulted and Agree with Plan of Care  Patient       Patient will benefit from skilled therapeutic intervention in order to improve the following deficits and impairments:  Pain, Increased fascial restricitons, Decreased coordination, Decreased mobility, Decreased activity tolerance, Decreased endurance, Decreased range of motion, Decreased strength, Hypomobility, Postural dysfunction, Abnormal gait, Decreased balance, Difficulty walking  Visit Diagnosis: Unsteadiness on feet  Muscle weakness (generalized)  History of  falling     Problem List Patient Active Problem List   Diagnosis Date Noted  . Pain of toe of left foot 08/17/2017  . Grief 08/17/2017  . Epistaxis 07/31/2017  . Leg swelling 07/31/2017  . Dry eyes 07/06/2017  . Pain and swelling of lower extremity, right 03/29/2017  . Right shoulder pain 03/29/2017  . Diarrhea 05/18/2016  . Fall 05/01/2016  . Unintentional weight loss 12/30/2015  . Leg weakness, bilateral 12/30/2015  . Hyperkalemia 01/01/2015  . Occasional numbness/prickling/tingling of  fingers and toes 12/18/2014  . Routine general medical examination at a health care facility 12/13/2013  . Macrocytic anemia 05/19/2013  . 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 Psoriasis   . Osteoarthritis, multiple sites   . Episodic mood disorder ([place]) 12/01/2011  . History of breast cancer 10/28/2011  . GERD (gastroesophageal reflux disease)   . Hyperlipidemia   . Hypertension   . Osteopenia   . Carotid stenosis 05/27/2011    Blythe Stanford, PT DPT 08/30/2017, 12:37 PM  [facility] [address] [place], [address] Phone: [phone]   Fax:  [phone]  Name: [name] MRN: [id number] Date of Birth: Mar 02, 1941

## 2017-09-01 ENCOUNTER — Ambulatory Visit: Payer: Medicare Other

## 2017-09-01 DIAGNOSIS — R2681 Unsteadiness on feet: Secondary | ICD-10-CM | POA: Diagnosis not present

## 2017-09-01 DIAGNOSIS — Z9181 History of falling: Secondary | ICD-10-CM

## 2017-09-01 DIAGNOSIS — M6281 Muscle weakness (generalized): Secondary | ICD-10-CM | POA: Diagnosis not present

## 2017-09-01 NOTE — Therapy (Signed)
[facility] [address], [place], [address] Phone: [phone]   Fax:  [phone]  Physical Therapy Treatment  Patient Details  Name: ERICHA Harris MRN: [id number] Date of Birth: July 17, 1941 Referring Provider: Caryl Bis MD   Encounter Date: 09/01/2017  PT End of Session - 09/01/17 0844    Visit Number  9    Number of Visits  16    Date for PT Re-Evaluation  08/31/17    PT Start Time  0835    PT Stop Time  0915    PT Time Calculation (min)  40 min    Activity Tolerance  Patient tolerated treatment well    Behavior During Therapy  [facility] for tasks assessed/performed       Past Medical History:  Diagnosis Date  . Breast cancer ([place]) 2001   left breast  . Cancer ([place]) 2001   left breast ca  . GERD (gastroesophageal reflux disease)   . Hyperlipidemia   . Hypertension   . Osteoarthritis, multiple sites   . Osteoarthrosis involving, or with mention of more than one site, but not specified as generalized, multiple sites   . Osteopenia   . Personal history of radiation therapy 2001   left breast ca  . Psoriasis     Past Surgical History:  Procedure Laterality Date  . ABDOMINAL HYSTERECTOMY    . ANKLE FRACTURE SURGERY  4/08   left---hardware still in place  . 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 BREAST EXCISIONAL BIOPSY Left yrs ago   benign  . BREAST LUMPECTOMY  2001   left  . CAROTID ENDARTERECTOMY Left 10/23/2004  . FRACTURE SURGERY    . SHOULDER SURGERY  6/07   left  . TONSILLECTOMY AND ADENOIDECTOMY    . TOTAL HIP ARTHROPLASTY  2004   right    There were no vitals filed for this visit.  Subjective Assessment - 09/01/17 0841    Subjective  Pt reports no major changes since the previous session and states she has not done much in terms of exercise since the passing of her husband.     Pertinent History  Patient previously seen for balance difficulties and shoulder pain and the  right.  Patient reports no falls in the past six months. Patient reports pain has not improved for the past month and has been about the same since the onset of pain.     Limitations  Walking    Patient Stated Goals  To improve balance when walking     Currently in Pain?  No/denies        TREATMENT: Therapeutic Exercise: Nustep step level 4; with use of arms and legs to improve strengthening  Standing on large dynadisc - x1.70min; weight shifting L/R - 1.65min;  Side stepping against black tubing and forward/back - x 5 (around 5 ft each side) Stepping over foam rollers step over step - x 10 Widened tandem stance weight shifts forward/backward - x 20  Tandem ambulation - 3 x 69ft Sit to stand with airex pad - x 5, with pillow x5 Backwards ambulation - x10   Patient demonstrates increased fatigue at the end of the session    PT Education - 09/01/17 0843    Education provided  Yes    Education Details  form/technique with exercise    Person(s) Educated  Patient    Methods  Explanation;Demonstration    Comprehension  Verbalized understanding;Returned demonstration  PT Long Term Goals - 09/01/17 0905      PT LONG TERM GOAL #1   Title  Patient will be independent with HEP to continue benefits of therapy after discharge.     Baseline  Dependent with form and technique for balancing exercises; moderate cueing for balance and strengthening    Time  4    Period  Weeks    Status  On-going      PT LONG TERM GOAL #2   Title  Patient will improve FGA to over 26/30 to indicate functional improvement in strength and balance limitations and decrease fall risk    Baseline  22/30; ; 09/01/2017: 24/30    Time  8    Period  Weeks    Status  On-going      PT LONG TERM GOAL #3   Title  Patient will improve 5xSTS to under 16 seconds without use of a pillow to indicate functional improvement of LE function and decreased fall risk    Baseline  19 sec with pillow under chair;  09/01/2017 12 seconds with use of a pillow     Time  8    Period  Weeks    Status  On-going      PT LONG TERM GOAL #4   Title  Patient will be able to balance for 10sec with SLS to improve static balance and ability to get dressed when in standing    Baseline  1 sec; 09/01/2017: 3 sec each leg    Time  8    Period  Weeks    Status  On-going            Plan - 09/01/17 0915    Clinical Impression Statement  Patient demonstrates improvement with static and dynamic balance and is making progress towards long term goals. Patient demonstrates improvement with 5xSTS, FGA, and single leg stance test indicating functional carryover and improvement. Although patient is improving, she continues to demonstrate increased fall risk with only able to perform sit to stance from a heightened surface, as well as demonstrating decreased FGA and single leg stance scores. Patient will benefit from further skilled therapy focused on improving these functional limitations to return to prior level of funciton.     Rehab Potential  Good    Clinical Impairments Affecting Rehab Potential  Positive: motivation; Negative: , age    PT Frequency  2x / week    PT Duration  6 weeks    PT Treatment/Interventions  ADLs/Self Care Home Management;Aquatic Therapy;Biofeedback;DME Instruction;Gait training;Stair training;Functional mobility training;Therapeutic activities;Therapeutic exercise;Balance training;Neuromuscular re-education;Patient/family education;Passive range of motion;Energy conservation;Ultrasound;Moist Heat;Iontophoresis 4mg /ml Dexamethasone;Cryotherapy;Electrical Stimulation;Manual techniques;Dry needling    PT Next Visit Plan  Progress balance and strengthening exercises,     PT Home Exercise Plan  See education section    Consulted and Agree with Plan of Care  Patient       Patient will benefit from skilled therapeutic intervention in order to improve the following deficits and impairments:  Pain,  Increased fascial restricitons, Decreased coordination, Decreased mobility, Decreased activity tolerance, Decreased endurance, Decreased range of motion, Decreased strength, Hypomobility, Postural dysfunction, Abnormal gait, Decreased balance, Difficulty walking  Visit Diagnosis: Unsteadiness on feet  Muscle weakness (generalized)  History of falling     Problem List Patient Active Problem List   Diagnosis Date Noted  . Pain of toe of left foot 08/17/2017  . Grief 08/17/2017  . Epistaxis 07/31/2017  . Leg swelling 07/31/2017  . Dry eyes 07/06/2017  .  Pain and swelling of lower extremity, right 03/29/2017  . Right shoulder pain 03/29/2017  . Diarrhea 05/18/2016  . Fall 05/01/2016  . Unintentional weight loss 12/30/2015  . Leg weakness, bilateral 12/30/2015  . Hyperkalemia 01/01/2015  . Occasional numbness/prickling/tingling of fingers and toes 12/18/2014  . Routine general medical examination at a health care facility 12/13/2013  . Macrocytic anemia 05/19/2013  . 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 Psoriasis   . Osteoarthritis, multiple sites   . Episodic mood disorder ([place]) 12/01/2011  . History of breast cancer 10/28/2011  . GERD (gastroesophageal reflux disease)   . Hyperlipidemia   . Hypertension   . Osteopenia   . Carotid stenosis 05/27/2011    Blythe Stanford, PT DPT 09/01/2017, 9:21 AM  [facility] [address], [place], [address] Phone: [phone]   Fax:  [phone]  Name: [name] MRN: [id number] Date of Birth: 01-20-1941

## 2017-09-06 ENCOUNTER — Telehealth: Payer: Self-pay | Admitting: Family Medicine

## 2017-09-06 ENCOUNTER — Ambulatory Visit: Payer: Medicare Other

## 2017-09-06 DIAGNOSIS — R2681 Unsteadiness on feet: Secondary | ICD-10-CM | POA: Diagnosis not present

## 2017-09-06 DIAGNOSIS — M6281 Muscle weakness (generalized): Secondary | ICD-10-CM

## 2017-09-06 DIAGNOSIS — Z9181 History of falling: Secondary | ICD-10-CM | POA: Diagnosis not present

## 2017-09-06 DIAGNOSIS — M7989 Other specified soft tissue disorders: Secondary | ICD-10-CM

## 2017-09-06 NOTE — Telephone Encounter (Signed)
Referral placed.

## 2017-09-06 NOTE — Therapy (Signed)
[facility] [address] [place], [address] Phone: [phone]   Fax:  [phone]  Physical Therapy Treatment  Patient Details  Name: [name] MRN: [id number] Date of Birth: 09-26-1940 Referring Provider: Caryl Bis MD   Encounter Date: 09/06/2017  PT End of Session - 09/06/17 1208    Visit Number  10    Number of Visits  16    Date for PT Re-Evaluation  09/29/17    PT Start Time  1117    PT Stop Time  1200    PT Time Calculation (min)  43 min    Equipment Utilized During Treatment  Gait belt    Activity Tolerance  Patient tolerated treatment well    Behavior During Therapy  [facility] for tasks assessed/performed       Past Medical History:  Diagnosis Date  . Breast cancer ([place]) 2001   left breast  . Cancer ([place]) 2001   left breast ca  . GERD (gastroesophageal reflux disease)   . Hyperlipidemia   . Hypertension   . Osteoarthritis, multiple sites   . Osteoarthrosis involving, or with mention of more than one site, but not specified as generalized, multiple sites   . Osteopenia   . Personal history of radiation therapy 2001   left breast ca  . Psoriasis     Past Surgical History:  Procedure Laterality Date  . ABDOMINAL HYSTERECTOMY    . ANKLE FRACTURE SURGERY  4/08   left---hardware still in place  . 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 BREAST EXCISIONAL BIOPSY Left yrs ago   benign  . BREAST LUMPECTOMY  2001   left  . CAROTID ENDARTERECTOMY Left 10/23/2004  . FRACTURE SURGERY    . SHOULDER SURGERY  6/07   left  . TONSILLECTOMY AND ADENOIDECTOMY    . TOTAL HIP ARTHROPLASTY  2004   right    There were no vitals filed for this visit.  Subjective Assessment - 09/06/17 1206    Subjective  Pt reports no major changes in fucntion since previous session. Pt states she has scheduled appointment with physician to discuss LE edema.    Pertinent History  Patient previously seen for  balance difficulties and shoulder pain and the right.  Patient reports no falls in the past six months. Patient reports pain has not improved for the past month and has been about the same since the onset of pain.     Limitations  Walking    Patient Stated Goals  To improve balance when walking     Currently in Pain?  No/denies       TREATMENT:  Therapeutic Exercise:  Nustep step level 4; with use of arms and legs to improve strengthening  Side stepping over half bosu ball - 2x2 min to improve dynamic balance with frontal plane movements Heel rocks on half foam roller - 2x40 to improve dynamic balance Tandem ambulation 10x10 ft to improve dynamic balance and LE endurance Single leg stance with intermittent UE support -- 2x 60 sec B to increase static balance bilaterally and increase LE strength Side stepping on airex beam - 1x8 to increase dynamic balance with frontal plane movements  Pt demonstrates increased fatigue with exercise at end of session     PT Education - 09/06/17 1208    Education provided  Yes    Education Details  Form/technique with exercise    Person(s) Educated  Patient  Methods  Explanation;Demonstration    Comprehension  Verbalized understanding;Returned demonstration          PT Long Term Goals - 09/01/17 0905      PT LONG TERM GOAL #1   Title  Patient will be independent with HEP to continue benefits of therapy after discharge.     Baseline  Dependent with form and technique for balancing exercises; moderate cueing for balance and strengthening    Time  4    Period  Weeks    Status  On-going      PT LONG TERM GOAL #2   Title  Patient will improve FGA to over 26/30 to indicate functional improvement in strength and balance limitations and decrease fall risk    Baseline  22/30; ; 09/01/2017: 24/30    Time  8    Period  Weeks    Status  On-going      PT LONG TERM GOAL #3   Title  Patient will improve 5xSTS to under 16 seconds without use of a  pillow to indicate functional improvement of LE function and decreased fall risk    Baseline  19 sec with pillow under chair; 09/01/2017 12 seconds with use of a pillow     Time  8    Period  Weeks    Status  On-going      PT LONG TERM GOAL #4   Title  Patient will be able to balance for 10sec with SLS to improve static balance and ability to get dressed when in standing    Baseline  1 sec; 09/01/2017: 3 sec each leg    Time  8    Period  Weeks    Status  On-going            Plan - 09/06/17 1210    Clinical Impression Statement  Patient continues to demonstrate improvement with maintaining stability on LEs for longer duration during single leg stance, indicating funcitonal carryover between sessions. Although patient demonstrates improvement, pt continues to demonstrate increased fall risk secondary to decreased stability during tandem ambulation with mid guard, indicating decreased dynamic balance.  Pt will continue to benefit from further skilled therapy to return to prior levels of function.    Rehab Potential  Good    Clinical Impairments Affecting Rehab Potential  Positive: motivation; Negative: , age    PT Frequency  2x / week    PT Duration  6 weeks    PT Treatment/Interventions  ADLs/Self Care Home Management;Aquatic Therapy;Biofeedback;DME Instruction;Gait training;Stair training;Functional mobility training;Therapeutic activities;Therapeutic exercise;Balance training;Neuromuscular re-education;Patient/family education;Passive range of motion;Energy conservation;Ultrasound;Moist Heat;Iontophoresis 4mg /ml Dexamethasone;Cryotherapy;Electrical Stimulation;Manual techniques;Dry needling    PT Next Visit Plan  Progress balance and strengthening exercises,     PT Home Exercise Plan  See education section    Consulted and Agree with Plan of Care  Patient       Patient will benefit from skilled therapeutic intervention in order to improve the following deficits and impairments:  Pain,  Increased fascial restricitons, Decreased coordination, Decreased mobility, Decreased activity tolerance, Decreased endurance, Decreased range of motion, Decreased strength, Hypomobility, Postural dysfunction, Abnormal gait, Decreased balance, Difficulty walking  Visit Diagnosis: Unsteadiness on feet  Muscle weakness (generalized)  History of falling     Problem List Patient Active Problem List   Diagnosis Date Noted  . Pain of toe of left foot 08/17/2017  . Grief 08/17/2017  . Epistaxis 07/31/2017  . Leg swelling 07/31/2017  . Dry eyes 07/06/2017  . Pain and  swelling of lower extremity, right 03/29/2017  . Right shoulder pain 03/29/2017  . Diarrhea 05/18/2016  . Fall 05/01/2016  . Unintentional weight loss 12/30/2015  . Leg weakness, bilateral 12/30/2015  . Hyperkalemia 01/01/2015  . Occasional numbness/prickling/tingling of fingers and toes 12/18/2014  . Routine general medical examination at a health care facility 12/13/2013  . Macrocytic anemia 05/19/2013  . 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 Psoriasis   . Osteoarthritis, multiple sites   . Episodic mood disorder ([place]) 12/01/2011  . History of breast cancer 10/28/2011  . GERD (gastroesophageal reflux disease)   . Hyperlipidemia   . Hypertension   . Osteopenia   . Carotid stenosis 05/27/2011    Ricard Dillon, SPT 09/06/2017, 12:14 PM  [facility] [address] [place], [address] Phone: [phone]   Fax:  [phone]  Name: [name] MRN: [id number] Date of Birth: Dec 06, 1940

## 2017-09-06 NOTE — Telephone Encounter (Signed)
Please advise   Copied from [place] [id number]. Topic: Inquiry >> Sep 06, 2017  8:38 AM Conception Chancy, NT wrote: Patient states she needs Dr. Caryl Bis to set her up an appt with Dr. Raynald Kemp office. She states her legs are still swollen.

## 2017-09-06 NOTE — Telephone Encounter (Signed)
Please advise 

## 2017-09-09 ENCOUNTER — Ambulatory Visit (INDEPENDENT_AMBULATORY_CARE_PROVIDER_SITE_OTHER): Payer: Medicare Other | Admitting: Vascular Surgery

## 2017-09-09 ENCOUNTER — Encounter (INDEPENDENT_AMBULATORY_CARE_PROVIDER_SITE_OTHER): Payer: Self-pay | Admitting: Vascular Surgery

## 2017-09-09 ENCOUNTER — Ambulatory Visit: Payer: Medicare Other

## 2017-09-09 ENCOUNTER — Telehealth: Payer: Self-pay | Admitting: Family Medicine

## 2017-09-09 VITALS — BP 158/98 | HR 84 | Resp 17 | Ht 66.5 in | Wt 130.0 lb

## 2017-09-09 DIAGNOSIS — M7989 Other specified soft tissue disorders: Secondary | ICD-10-CM | POA: Diagnosis not present

## 2017-09-09 DIAGNOSIS — M79604 Pain in right leg: Secondary | ICD-10-CM | POA: Diagnosis not present

## 2017-09-09 DIAGNOSIS — I890 Lymphedema, not elsewhere classified: Secondary | ICD-10-CM

## 2017-09-09 DIAGNOSIS — I872 Venous insufficiency (chronic) (peripheral): Secondary | ICD-10-CM

## 2017-09-09 DIAGNOSIS — E782 Mixed hyperlipidemia: Secondary | ICD-10-CM | POA: Diagnosis not present

## 2017-09-09 DIAGNOSIS — R2681 Unsteadiness on feet: Secondary | ICD-10-CM

## 2017-09-09 DIAGNOSIS — M159 Polyosteoarthritis, unspecified: Secondary | ICD-10-CM | POA: Diagnosis not present

## 2017-09-09 DIAGNOSIS — M6281 Muscle weakness (generalized): Secondary | ICD-10-CM

## 2017-09-09 NOTE — Telephone Encounter (Signed)
scheduled

## 2017-09-09 NOTE — Telephone Encounter (Signed)
Received call from vascular surgery who saw the patient today for her venous insufficiency.  They also noted that the patient had gone to physical therapy today and there was a note regarding her blood pressures and heart rate.  They noted that she was fatiguing more quickly today though in the vascular surgeon's office she felt fine and had no symptoms.  Blood pressure was elevated above baseline at the vascular surgeons office.  Heart rate is normal at the vascular surgery office.  I discussed that we would touch base with the patient regarding her symptoms and try to get her in to the office for reevaluation. Please touch base with the patient and see if she can come in for evaluation tomorrow. If she has recurrence of symptoms she should go to the ED for evaluation. Thanks.

## 2017-09-09 NOTE — Telephone Encounter (Signed)
Tried calling patient left message to return call, ok for pec to speak to patient to follow up on symptoms, ok to schedule at 1130 09/10/17. Please notify patient to go to er for evaluation if symptoms worsen or experiencing recurrence of symptoms.

## 2017-09-09 NOTE — Therapy (Signed)
[facility] [address] [place], [address] Phone: [phone]   Fax:  [phone]  Patient Details  Name: [name] MRN: [id number] Date of Birth: [birth year] Referring Provider:  [name], MD  Encounter Date: 09/09/2017   PT did not perform treatment today secondary to blood pressure reading of 138/111, resting HR of 120 and reported increased shortness of breath over the past two days and increased fatigue with Nustep. Pt also demonstrates increased swelling in LEs bilaterally. Pt reports she is going to her MD following this session and was provided note on vital signs taken today to report to MD.    Ricard Dillon, SPT 09/09/2017, 1:28 PM  [facility] [address], [place], [address] Phone: [phone]   Fax:  [phone]

## 2017-09-09 NOTE — Progress Notes (Signed)
MRN : [id number]  [name] is a 77 y.o. (October 16, 1940) female who presents with chief complaint of No chief complaint on file. Marland Kitchen  History of Present Illness: Patient is seen for evaluation of leg pain and leg swelling. The patient first noticed the swelling remotely. The swelling is associated with pain and discoloration. The pain and swelling worsens with prolonged dependency and improves with elevation. The pain is unrelated to activity.  The patient notes that in the morning the legs are significantly improved but they steadily worsened throughout the course of the day. The patient also notes a steady worsening of the discoloration in the ankle and shin area.   The patient denies claudication symptoms.  The patient denies symptoms consistent with rest pain.  The patient denies and extensive history of DJD and LS spine disease.  The patient has no had any past angiography, interventions or vascular surgery.  Elevation makes the leg symptoms better, dependency makes them much worse. There is no history of ulcerations. The patient denies any recent changes in medications.  The patient has not been wearing graduated compression.  The patient denies a history of DVT or PE. There is no prior history of phlebitis. There is no history of primary lymphedema.  No history of malignancies. No history of trauma or groin or pelvic surgery. There is no history of radiation treatment to the groin or pelvis  The patient denies amaurosis fugax or recent TIA symptoms. There are no recent neurological changes noted. The patient denies recent episodes of angina or shortness of breath  No outpatient medications have been marked as taking for the 09/09/17 encounter (Appointment) with Delana Meyer, Dolores Lory, MD.    Past Medical History:  Diagnosis Date  . Breast cancer ([place]) 2001   left breast  . Cancer ([place]) 2001   left breast ca  . GERD (gastroesophageal reflux disease)   . Hyperlipidemia   .  Hypertension   . Osteoarthritis, multiple sites   . Osteoarthrosis involving, or with mention of more than one site, but not specified as generalized, multiple sites   . Osteopenia   . Personal history of radiation therapy 2001   left breast ca  . Psoriasis     Past Surgical History:  Procedure Laterality Date  . ABDOMINAL HYSTERECTOMY    . ANKLE FRACTURE SURGERY  4/08   left---hardware still in place  . 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 BREAST EXCISIONAL BIOPSY Left yrs ago   benign  . BREAST LUMPECTOMY  2001   left  . CAROTID ENDARTERECTOMY Left 10/23/2004  . FRACTURE SURGERY    . SHOULDER SURGERY  6/07   left  . TONSILLECTOMY AND ADENOIDECTOMY    . TOTAL HIP ARTHROPLASTY  2004   right    Social History Social History   Tobacco Use  . Smoking status: Former Smoker    Packs/day: 1.00    Years: 40.00    Pack years: 40.00    Types: Cigarettes    Last attempt to quit: 07/21/1995    Years since quitting: 22.1  . Smokeless tobacco: Never Used  Substance Use Topics  . Alcohol use: Yes    Alcohol/week: 9.6 oz    Types: 2 Glasses of wine, 14 Standard drinks or equivalent per week    Comment: daily  . Drug use: No    Family History Family History  Problem Relation Age of Onset  . Hypertension Brother   . Pneumonia Mother   .  Leukemia Father   . Heart disease Neg Hx   . Diabetes Neg Hx   No family history of bleeding/clotting disorders, porphyria or autoimmune disease   Allergies  Allergen Reactions  . Wellbutrin [Bupropion] Other (See Comments)    problems sleeping and nightmares     REVIEW OF SYSTEMS (Negative unless checked)  Constitutional: [] Weight loss  [] Fever  [] Chills Cardiac: [] Chest pain   [] Chest pressure   [] Palpitations   [] Shortness of breath when laying flat   [] Shortness of breath with exertion. Vascular:  [] Pain in legs with walking   [] Pain in legs at rest  [] History of DVT   [] Phlebitis   [x] Swelling in legs   [x] Varicose veins    [] Non-healing ulcers Pulmonary:   [] Uses home oxygen   [] Productive cough   [] Hemoptysis   [] Wheeze  [] COPD   [] Asthma Neurologic:  [] Dizziness   [] Seizures   [] History of stroke   [] History of TIA  [] Aphasia   [] Vissual changes   [] Weakness or numbness in arm   [] Weakness or numbness in leg Musculoskeletal:   [] Joint swelling   [x] Joint pain   [] Low back pain Hematologic:  [] Easy bruising  [] Easy bleeding   [] Hypercoagulable state   [] Anemic Gastrointestinal:  [] Diarrhea   [] Vomiting  [] Gastroesophageal reflux/heartburn   [] Difficulty swallowing. Genitourinary:  [] Chronic kidney disease   [] Difficult urination  [] Frequent urination   [] Blood in urine Skin:  [x] Rashes   [] Ulcers  Psychological:  [] History of anxiety   []  History of major depression.  Physical Examination  There were no vitals filed for this visit. There is no height or weight on file to calculate BMI. Gen: WD/WN, NAD Head: [place]/AT, No temporalis wasting.  Ear/Nose/Throat: Hearing grossly intact, nares w/o erythema or drainage, poor dentition Eyes: PER, EOMI, sclera nonicteric.  Neck: Supple, no masses.  No bruit or JVD.  Pulmonary:  Good air movement, clear to auscultation bilaterally, no use of accessory muscles.  Cardiac: RRR, normal S1, S2, no Murmurs. Vascular: extensive small varicosities present bilateral ankles and feet.  Severe venous stasis changes to the legs bilaterally.  2+ soft pitting edema Vessel Right Left  Radial Palpable Palpable  PT Not Palpable Not Palpable  DP Not Palpable Not Palpable  Gastrointestinal: soft, non-distended. No guarding/no peritoneal signs.  Musculoskeletal: M/S 5/5 throughout.  No deformity or atrophy.  Neurologic: CN 2-12 intact. Pain and light touch intact in extremities.  Symmetrical.  Speech is fluent. Motor exam as listed above. Psychiatric: Judgment intact, Mood & affect appropriate for pt's clinical situation. Dermatologic: Severe venous rashes no ulcers noted.  No changes  consistent with cellulitis. Lymph : No Cervical lymphadenopathy, no lichenification or skin changes of chronic lymphedema.  CBC Lab Results  Component Value Date   WBC 10.7 (H) 07/29/2017   HGB 10.4 Repeated and verified X2. (L) 07/29/2017   HCT 31.3 (L) 07/29/2017   MCV 109.2 (H) 07/29/2017   PLT 271.0 07/29/2017    BMET    Component Value Date/Time   NA 137 07/09/2017 1016   NA 139 05/01/2013 1605   K 4.6 07/09/2017 1016   K 4.2 05/01/2013 1605   CL 101 07/09/2017 1016   CL 105 05/01/2013 1605   CO2 28 07/09/2017 1016   CO2 25 05/01/2013 1605   GLUCOSE 100 (H) 07/09/2017 1016   GLUCOSE 89 05/01/2013 1605   BUN 20 07/09/2017 1016   BUN 17 05/01/2013 1605   CREATININE 0.79 07/09/2017 1016   CREATININE 0.88 05/01/2013 1605   CALCIUM  9.6 07/09/2017 1016   CALCIUM 9.2 05/01/2013 1605   GFRNONAA >60 05/01/2013 1605   GFRAA >60 05/01/2013 1605   CrCl cannot be calculated (Patient's most recent lab result is older than the maximum 21 days allowed.).  COAG No results found for: INR, PROTIME  Radiology Dg Toe 3rd Left  Result Date: 08/17/2017 CLINICAL DATA:  Callus formation and pain of the distal left third toe for 1 month EXAM: LEFT THIRD TOE COMPARISON:  None. FINDINGS: No acute abnormality is seen with certainty. There is degenerative change of the left first MTP joint with some loss of joint space and sclerosis with mild spurring. No erosive change is seen. However, there is a questionable fracture involving the distal portion of the proximal phalanx of the left fifth toe and clinical correlation is recommended. Unfortunately that area was not included on the attempted lateral view. IMPRESSION: 1. No definite abnormality of left third toe is seen. 2. 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 3. Cannot exclude subtle fracture of the distal portion of the proximal phalanx of the left fifth toe. Correlate clinically. Electronically Signed   By: Ivar Drape  M.D.   On: 08/17/2017 14:23     Assessment/Plan 1. Pain and swelling of lower extremity, right No surgery or intervention at this point in time.    I have had a long discussion with the patient regarding venous insufficiency and why it  causes symptoms. I have discussed with the patient the chronic skin changes that accompany venous insufficiency and the long term sequela such as infection and ulceration.  Patient will begin wearing graduated compression stockings class 1 (20-30 mmHg) or compression wraps on a daily basis a prescription was given. The patient will put the stockings on first thing in the morning and removing them in the evening. The patient is instructed specifically not to sleep in the stockings.    In addition, behavioral modification including several periods of elevation of the lower extremities during the day will be continued. I have demonstrated that proper elevation is a position with the ankles at heart level.  The patient is instructed to begin routine exercise, especially walking on a daily basis  Patient's duplex ultrasound of the venous system is negative for DVT and reflux of the GSV  ABI will be obtained given the lack of palpable pulses  Following the review of the ultrasound the patient will follow up in 3 months to reassess the degree of swelling and the control that graduated compression stockings or compression wraps  is offering.   The patient can be assessed for a Lymph Pump at that time   A total of 70 minutes was spent with this patient and greater than 50% was spent in counseling and coordination of care with the patient.  Discussion included the treatment options for vascular disease including indications for surgery and intervention.  Also discussed is the appropriate timing of treatment.  In addition medical therapy was discussed.  2. Chronic venous insufficiency See #1  3. Lymphedema See #1  4. Mixed hyperlipidemia Continue statin as ordered  and reviewed, no changes at this time   5. Osteoarthritis of multiple joints, unspecified osteoarthritis type Continue NSAID medications as already ordered, these medications have been reviewed and there are no changes at this time.  Continued activity and therapy was stressed.     Hortencia Pilar, MD  09/09/2017 1:04 PM

## 2017-09-10 ENCOUNTER — Emergency Department: Payer: Medicare Other

## 2017-09-10 ENCOUNTER — Inpatient Hospital Stay
Admission: EM | Admit: 2017-09-10 | Discharge: 2017-09-12 | DRG: 308 | Disposition: A | Discharge status: Home / Self Care | Payer: Medicare Other | Attending: Internal Medicine | Admitting: Internal Medicine

## 2017-09-10 ENCOUNTER — Other Ambulatory Visit: Payer: Self-pay

## 2017-09-10 ENCOUNTER — Ambulatory Visit (INDEPENDENT_AMBULATORY_CARE_PROVIDER_SITE_OTHER): Payer: Medicare Other | Admitting: Family Medicine

## 2017-09-10 ENCOUNTER — Encounter: Payer: Self-pay | Admitting: Family Medicine

## 2017-09-10 VITALS — BP 110/70 | Temp 97.8°F | Wt 130.2 lb

## 2017-09-10 DIAGNOSIS — I110 Hypertensive heart disease with heart failure: Secondary | ICD-10-CM | POA: Diagnosis present

## 2017-09-10 DIAGNOSIS — Z923 Personal history of irradiation: Secondary | ICD-10-CM | POA: Diagnosis not present

## 2017-09-10 DIAGNOSIS — R0602 Shortness of breath: Secondary | ICD-10-CM | POA: Diagnosis not present

## 2017-09-10 DIAGNOSIS — I872 Venous insufficiency (chronic) (peripheral): Secondary | ICD-10-CM

## 2017-09-10 DIAGNOSIS — Z96641 Presence of right artificial hip joint: Secondary | ICD-10-CM | POA: Diagnosis present

## 2017-09-10 DIAGNOSIS — E785 Hyperlipidemia, unspecified: Secondary | ICD-10-CM | POA: Diagnosis present

## 2017-09-10 DIAGNOSIS — E876 Hypokalemia: Secondary | ICD-10-CM | POA: Diagnosis present

## 2017-09-10 DIAGNOSIS — Z7982 Long term (current) use of aspirin: Secondary | ICD-10-CM

## 2017-09-10 DIAGNOSIS — K219 Gastro-esophageal reflux disease without esophagitis: Secondary | ICD-10-CM | POA: Diagnosis present

## 2017-09-10 DIAGNOSIS — I509 Heart failure, unspecified: Secondary | ICD-10-CM | POA: Diagnosis not present

## 2017-09-10 DIAGNOSIS — Z888 Allergy status to other drugs, medicaments and biological substances status: Secondary | ICD-10-CM | POA: Diagnosis not present

## 2017-09-10 DIAGNOSIS — I5031 Acute diastolic (congestive) heart failure: Secondary | ICD-10-CM | POA: Diagnosis present

## 2017-09-10 DIAGNOSIS — M1990 Unspecified osteoarthritis, unspecified site: Secondary | ICD-10-CM | POA: Diagnosis present

## 2017-09-10 DIAGNOSIS — I4891 Unspecified atrial fibrillation: Secondary | ICD-10-CM | POA: Diagnosis not present

## 2017-09-10 DIAGNOSIS — I499 Cardiac arrhythmia, unspecified: Secondary | ICD-10-CM | POA: Diagnosis not present

## 2017-09-10 DIAGNOSIS — Z79899 Other long term (current) drug therapy: Secondary | ICD-10-CM

## 2017-09-10 DIAGNOSIS — F320 Major depressive disorder, single episode, mild: Secondary | ICD-10-CM | POA: Diagnosis not present

## 2017-09-10 DIAGNOSIS — Z9181 History of falling: Secondary | ICD-10-CM

## 2017-09-10 DIAGNOSIS — I481 Persistent atrial fibrillation: Secondary | ICD-10-CM | POA: Diagnosis not present

## 2017-09-10 DIAGNOSIS — Z87891 Personal history of nicotine dependence: Secondary | ICD-10-CM | POA: Diagnosis not present

## 2017-09-10 DIAGNOSIS — S46811A Strain of other muscles, fascia and tendons at shoulder and upper arm level, right arm, initial encounter: Secondary | ICD-10-CM | POA: Diagnosis not present

## 2017-09-10 DIAGNOSIS — M8580 Other specified disorders of bone density and structure, unspecified site: Secondary | ICD-10-CM | POA: Diagnosis present

## 2017-09-10 DIAGNOSIS — I5021 Acute systolic (congestive) heart failure: Secondary | ICD-10-CM | POA: Diagnosis present

## 2017-09-10 DIAGNOSIS — Z853 Personal history of malignant neoplasm of breast: Secondary | ICD-10-CM

## 2017-09-10 DIAGNOSIS — I10 Essential (primary) hypertension: Secondary | ICD-10-CM | POA: Diagnosis not present

## 2017-09-10 DIAGNOSIS — I340 Nonrheumatic mitral (valve) insufficiency: Secondary | ICD-10-CM | POA: Diagnosis not present

## 2017-09-10 LAB — BASIC METABOLIC PANEL
Anion gap: 12 (ref 5–15)
BUN: 22 mg/dL — ABNORMAL HIGH (ref 6–20)
CO2: 23 mmol/L (ref 22–32)
Calcium: 9.6 mg/dL (ref 8.9–10.3)
Chloride: 105 mmol/L (ref 101–111)
Creatinine, Ser: 0.86 mg/dL (ref 0.44–1.00)
GFR calc Af Amer: >60 mL/min (ref >60)
GFR calc non Af Amer: >60 mL/min (ref >60)
Glucose, Bld: 88 mg/dL (ref 65–99)
Potassium: 4.4 mmol/L (ref 3.5–5.1)
Sodium: 140 mmol/L (ref 135–145)

## 2017-09-10 LAB — CBC
HCT: 38.6 % (ref 35.0–47.0)
Hemoglobin: 12.8 g/dL (ref 12.0–16.0)
MCH: 35.7 pg — ABNORMAL HIGH (ref 26.0–34.0)
MCHC: 33.2 g/dL (ref 32.0–36.0)
MCV: 107.5 fL — ABNORMAL HIGH (ref 80.0–100.0)
Platelets: 249 10*3/uL (ref 150–440)
RBC: 3.59 MIL/uL — ABNORMAL LOW (ref 3.80–5.20)
RDW: 13.1 % (ref 11.5–14.5)
WBC: 7.8 10*3/uL (ref 3.6–11.0)

## 2017-09-10 LAB — TSH: TSH: 2.777 u[IU]/mL (ref 0.350–4.500)

## 2017-09-10 LAB — TROPONIN I
Troponin I: <0.03 ng/mL (ref <0.03)
Troponin I: <0.03 ng/mL (ref <0.03)

## 2017-09-10 MED ORDER — LORAZEPAM 1 MG PO TABS
1.0000 mg | ORAL_TABLET | Freq: Two times a day (BID) | ORAL | Status: DC
  Administered 2017-09-10: 1 mg via ORAL
  Filled 2017-09-10 (×2): qty 1

## 2017-09-10 MED ORDER — SODIUM CHLORIDE 0.9% FLUSH: 3.0000 mL | INTRAVENOUS | Status: DC | PRN

## 2017-09-10 MED ORDER — ACETAMINOPHEN 325 MG PO TABS
650.0000 mg | ORAL_TABLET | Freq: Four times a day (QID) | ORAL | Status: DC | PRN
  Administered 2017-09-10 – 2017-09-12 (×2): 650 mg via ORAL
  Filled 2017-09-10 (×2): qty 2

## 2017-09-10 MED ORDER — METOPROLOL TARTRATE 25 MG PO TABS
25.0000 mg | ORAL_TABLET | Freq: Two times a day (BID) | ORAL | Status: DC
  Administered 2017-09-10 – 2017-09-12 (×4): 25 mg via ORAL
  Filled 2017-09-10 (×4): qty 1

## 2017-09-10 MED ORDER — DILTIAZEM HCL 60 MG PO TABS
30.0000 mg | ORAL_TABLET | Freq: Four times a day (QID) | ORAL | Status: DC
  Administered 2017-09-10 – 2017-09-11 (×2): 30 mg via ORAL
  Filled 2017-09-10: qty 0.5
  Filled 2017-09-10: qty 1
  Filled 2017-09-10 (×2): qty 0.5
  Filled 2017-09-10: qty 1
  Filled 2017-09-10: qty 0.5

## 2017-09-10 MED ORDER — ONDANSETRON HCL 4 MG/2ML IJ SOLN
4.0000 mg | Freq: Four times a day (QID) | INTRAMUSCULAR | Status: DC | PRN
  Filled 2017-09-10: qty 2

## 2017-09-10 MED ORDER — RIVAROXABAN 20 MG PO TABS
20.0000 mg | ORAL_TABLET | Freq: Every day | ORAL | Status: DC
  Administered 2017-09-10: 20 mg via ORAL
  Filled 2017-09-10: qty 1

## 2017-09-10 MED ORDER — OCUVITE-LUTEIN PO CAPS
1.0000 | ORAL_CAPSULE | Freq: Two times a day (BID) | ORAL | Status: DC
  Administered 2017-09-10 – 2017-09-12 (×4): 1 via ORAL
  Filled 2017-09-10 (×4): qty 1

## 2017-09-10 MED ORDER — ONDANSETRON HCL 4 MG PO TABS: 4.0000 mg | ORAL_TABLET | Freq: Four times a day (QID) | ORAL | Status: DC | PRN

## 2017-09-10 MED ORDER — ADULT MULTIVITAMIN W/MINERALS CH
1.0000 | ORAL_TABLET | Freq: Every day | ORAL | Status: DC
  Administered 2017-09-11 – 2017-09-12 (×2): 1 via ORAL
  Filled 2017-09-10 (×2): qty 1

## 2017-09-10 MED ORDER — SENNOSIDES-DOCUSATE SODIUM 8.6-50 MG PO TABS: 1.0000 | ORAL_TABLET | Freq: Every evening | ORAL | Status: DC | PRN

## 2017-09-10 MED ORDER — DILTIAZEM HCL 100 MG IV SOLR
5.0000 mg/h | INTRAVENOUS | Status: DC
  Administered 2017-09-10: 15 mg/h via INTRAVENOUS
  Filled 2017-09-10 (×2): qty 100

## 2017-09-10 MED ORDER — MIRTAZAPINE 15 MG PO TABS
15.0000 mg | ORAL_TABLET | Freq: Every day | ORAL | Status: DC
  Administered 2017-09-10 – 2017-09-11 (×2): 15 mg via ORAL
  Filled 2017-09-10 (×2): qty 1

## 2017-09-10 MED ORDER — ACETAMINOPHEN 650 MG RE SUPP: 650.0000 mg | Freq: Four times a day (QID) | RECTAL | Status: DC | PRN

## 2017-09-10 MED ORDER — DILTIAZEM LOAD VIA INFUSION
5.0000 mg | Freq: Once | INTRAVENOUS | Status: AC
  Administered 2017-09-10: 5 mg via INTRAVENOUS
  Filled 2017-09-10: qty 5

## 2017-09-10 MED ORDER — VITAMIN D 1000 UNITS PO TABS
1000.0000 [IU] | ORAL_TABLET | Freq: Two times a day (BID) | ORAL | Status: DC
  Administered 2017-09-10 – 2017-09-12 (×4): 1000 [IU] via ORAL
  Filled 2017-09-10 (×4): qty 1

## 2017-09-10 MED ORDER — FUROSEMIDE 10 MG/ML IJ SOLN
40.0000 mg | Freq: Every day | INTRAMUSCULAR | Status: DC
  Administered 2017-09-10 – 2017-09-11 (×2): 40 mg via INTRAVENOUS
  Filled 2017-09-10 (×2): qty 4

## 2017-09-10 MED ORDER — SODIUM CHLORIDE 0.9 % IV SOLN: 250.0000 mL | INTRAVENOUS | Status: DC | PRN

## 2017-09-10 MED ORDER — ASPIRIN EC 81 MG PO TBEC
81.0000 mg | DELAYED_RELEASE_TABLET | Freq: Every day | ORAL | Status: DC
  Administered 2017-09-11 – 2017-09-12 (×2): 81 mg via ORAL
  Filled 2017-09-10 (×2): qty 1

## 2017-09-10 MED ORDER — SODIUM CHLORIDE 0.9% FLUSH
3.0000 mL | Freq: Two times a day (BID) | INTRAVENOUS | Status: DC
  Administered 2017-09-10 – 2017-09-12 (×4): 3 mL via INTRAVENOUS

## 2017-09-10 MED ORDER — DILTIAZEM HCL 100 MG IV SOLR
5.0000 mg/h | INTRAVENOUS | Status: DC
  Administered 2017-09-10: 5 mg/h via INTRAVENOUS
  Filled 2017-09-10: qty 100

## 2017-09-10 NOTE — H&P (Signed)
[facility] NAME: [name]    MR#:  [id number]  DATE OF BIRTH:  10-08-1940  DATE OF ADMISSION:  09/10/2017  PRIMARY CARE PHYSICIAN: [name], MD   REQUESTING/REFERRING PHYSICIAN:   CHIEF COMPLAINT:   Chief Complaint  Patient presents with  . Shortness of Breath    HISTORY OF PRESENT ILLNESS: [name]  is a 77 y.o. female with a known history of breast cancer, GERD, hyperlipidemia, hypertension, osteoarthritis presented to the emergency room with shortness of breath and palpitations.  Patient was at physical therapy when she felt short of breath.  She felt her heart was racing.  Patient was found to be in atrial fibrillation with rapid rate when she presented to the emergency room was started on IV Cardizem drip.  She was worked up with chest x-ray which showed congestive changes.  Has never seen any cardiologist outpatient.  No fever and chills.  PAST MEDICAL HISTORY:   Past Medical History:  Diagnosis Date  . Breast cancer ([place]) 2001   left breast  . Cancer ([place]) 2001   left breast ca  . GERD (gastroesophageal reflux disease)   . Hyperlipidemia   . Hypertension   . Osteoarthritis, multiple sites   . Osteoarthrosis involving, or with mention of more than one site, but not specified as generalized, multiple sites   . Osteopenia   . Personal history of radiation therapy 2001   left breast ca  . Psoriasis     PAST SURGICAL HISTORY:  Past Surgical History:  Procedure Laterality Date  . ABDOMINAL HYSTERECTOMY    . ANKLE FRACTURE SURGERY  4/08   left---hardware still in place  . 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 BREAST EXCISIONAL BIOPSY Left yrs ago   benign  . BREAST LUMPECTOMY  2001   left  . CAROTID ENDARTERECTOMY Left 10/23/2004  . FRACTURE SURGERY    . SHOULDER SURGERY  6/07   left  . TONSILLECTOMY AND ADENOIDECTOMY    . TOTAL HIP ARTHROPLASTY  2004   right    SOCIAL  HISTORY:  Social History   Tobacco Use  . Smoking status: Former Smoker    Packs/day: 1.00    Years: 40.00    Pack years: 40.00    Types: Cigarettes    Last attempt to quit: 07/21/1995    Years since quitting: 22.1  . Smokeless tobacco: Never Used  Substance Use Topics  . Alcohol use: Yes    Alcohol/week: 9.6 oz    Types: 2 Glasses of wine, 14 Standard drinks or equivalent per week    Comment: daily    FAMILY HISTORY:  Family History  Problem Relation Age of Onset  . Hypertension Brother   . Pneumonia Mother   . Leukemia Father   . Heart disease Neg Hx   . Diabetes Neg Hx     DRUG ALLERGIES:  Allergies  Allergen Reactions  . Wellbutrin [Bupropion] Other (See Comments)    problems sleeping and nightmares    REVIEW OF SYSTEMS:   CONSTITUTIONAL: No fever, fatigue or weakness.  EYES: No blurred or double vision.  EARS, NOSE, AND THROAT: No tinnitus or ear pain.  RESPIRATORY: No cough, shortness of breath, wheezing or hemoptysis.  CARDIOVASCULAR: No chest pain, Has orthopnea, edema.  Has palpitations on and off. GASTROINTESTINAL: No nausea, vomiting, diarrhea or abdominal pain.  GENITOURINARY: No dysuria, hematuria.  ENDOCRINE: No polyuria, nocturia,  HEMATOLOGY: No  anemia, easy bruising or bleeding SKIN: No rash or lesion. MUSCULOSKELETAL: No joint pain or arthritis.   NEUROLOGIC: No tingling, numbness, weakness.  PSYCHIATRY: No anxiety or depression.   MEDICATIONS AT HOME:  Prior to Admission medications   Medication Sig Start Date End Date Taking? Authorizing Provider  aspirin 81 MG tablet Take 81 mg by mouth daily.   Yes [provider]  Cholecalciferol (VITAMIN D3) 1000 units CAPS Take 1 capsule by mouth 2 (two) times daily.    Yes [provider]  gentamicin cream (GARAMYCIN) 0.1 % Apply 1 application topically 3 (three) times daily. 08/24/17  Yes Edrick Kins, DPM  metoprolol tartrate (LOPRESSOR) 50 MG tablet Take 0.5 tablets (25 mg total)  by mouth 2 (two) times daily. 08/28/17  Yes [name], MD  mirtazapine (REMERON) 15 MG tablet TAKE 1 TABLET (15 MG TOTAL) BY MOUTH AT BEDTIME. 07/06/17  Yes [name], MD  Multiple Vitamin (MULTIVITAMIN) capsule Take 1 capsule by mouth daily.     Yes [provider]  Multiple Vitamins-Minerals (PRESERVISION AREDS 2) CAPS Take 1 tablet by mouth 2 (two) times daily.   Yes [provider]  LORazepam (ATIVAN) 0.5 MG tablet TAKE 1 TABLET BY MOUTH 2 TIMES DAILY AS NEEDED FOR ANXIETY 06/25/17   [name], MD      PHYSICAL EXAMINATION:   VITAL SIGNS: Blood pressure (!) 161/62, pulse 89, temperature 97.6 F (36.4 C), temperature source Axillary, resp. rate 20, height 5\' 6"  (1.676 m), weight 59 kg (130 lb), SpO2 93 %.  GENERAL:  77 y.o.-year-old patient lying in the bed with no acute distress.  EYES: Pupils equal, round, reactive to light and accommodation. No scleral icterus. Extraocular muscles intact.  HEENT: Head atraumatic, normocephalic. Oropharynx and nasopharynx clear.  NECK:  Supple, no jugular venous distention. No thyroid enlargement, no tenderness.  LUNGS: Decreased breath sounds bilaterally, basal crepitations heard. No use of accessory muscles of respiration.  CARDIOVASCULAR: S1, S2 irregular. No murmurs, rubs, or gallops.  ABDOMEN: Soft, nontender, nondistended. Bowel sounds present. No organomegaly or mass.  EXTREMITIES: Has pedal edema, No  cyanosis, or clubbing.  NEUROLOGIC: Cranial nerves II through XII are intact. Muscle strength 5/5 in all extremities. Sensation intact. Gait not checked.  PSYCHIATRIC: The patient is alert and oriented x 3.  SKIN: No obvious rash, lesion, or ulcer.   LABORATORY PANEL:   CBC Recent Labs  Lab 09/10/17 1354  WBC 7.8  HGB 12.8  HCT 38.6  PLT 249  MCV 107.5*  MCH 35.7*  MCHC 33.2  RDW 13.1    ------------------------------------------------------------------------------------------------------------------  Chemistries  Recent Labs  Lab 09/10/17 1354  NA 140  K 4.4  CL 105  CO2 23  GLUCOSE 88  BUN 22*  CREATININE 0.86  CALCIUM 9.6   ------------------------------------------------------------------------------------------------------------------ estimated creatinine clearance is 50.2 mL/min (by C-G formula based on SCr of 0.86 mg/dL). ------------------------------------------------------------------------------------------------------------------ Recent Labs    09/10/17 1354  TSH 2.777     Coagulation profile No results for input(s): INR, PROTIME in the last 168 hours. ------------------------------------------------------------------------------------------------------------------- No results for input(s): DDIMER in the last 72 hours. -------------------------------------------------------------------------------------------------------------------  Cardiac Enzymes Recent Labs  Lab 09/10/17 1354  TROPONINI <0.03   ------------------------------------------------------------------------------------------------------------------ Invalid input(s): POCBNP  ---------------------------------------------------------------------------------------------------------------  Urinalysis No results found for: COLORURINE, APPEARANCEUR, LABSPEC, PHURINE, GLUCOSEU, HGBUR, BILIRUBINUR, KETONESUR, PROTEINUR, UROBILINOGEN, NITRITE, LEUKOCYTESUR   RADIOLOGY: Dg Chest Portable 1 View  Result Date: 09/10/2017 CLINICAL DATA:  Increasing shortness of breath EXAM: PORTABLE CHEST 1 VIEW  COMPARISON:  08/06/2008 FINDINGS: Cardiac enlargement noted. Mild aortic atherosclerosis. There are mild-to-moderate bilateral pleural effusions and mild interstitial edema. No airspace consolidation. The visualized osseous structures are unremarkable. IMPRESSION: 1. Mild congestive heart  failure. Electronically Signed   By: Kerby Moors M.D.   On: 09/10/2017 14:09    EKG: Orders placed or performed during the hospital encounter of 09/10/17  . EKG 12-Lead  . EKG 12-Lead  . ED EKG within 10 minutes  . ED EKG within 10 minutes    IMPRESSION AND PLAN: 77 year old elderly female patient with history of breast cancer, GERD, hyperlipidemia, hypertension presented to the emergency room with shortness of breath.  Admitting diagnosis 1.  New onset atrial fibrillation 2 new onset congestive heart failure. 3.  Hyperlipidemia 4.  Hypertension Treatment plan Admit patient to telemetry Diurese patient with IV Lasix IV Cardizem drip for rate control Cardiology evaluation Echocardiogram Anticoagulate patient with oral Xarelto  All the records are reviewed and case discussed with ED provider. Management plans discussed with the patient, family and they are in agreement.  CODE STATUS: Full code Code Status History    This patient does not have a recorded code status. Please follow your organizational policy for patients in this situation.    Advance Directive Documentation     Most Recent Value  Type of Advance Directive  Healthcare Power of Attorney, Living will  Pre-existing out of facility DNR order (yellow form or pink MOST form)  No data  "MOST" Form in Place?  No data       TOTAL TIME TAKING CARE OF THIS PATIENT: 52 minutes.    Saundra Shelling M.D on 09/10/2017 at 5:19 PM  Between 7am to 6pm - Pager - [phone]  After 6pm go to [email or web address] - password [email or web address] [facility]  [facility]  Office  [phone]  CC: Primary care physician; [name], MD

## 2017-09-10 NOTE — Progress Notes (Signed)
Dana Rumps, MD Phone: [phone]  KINZLEIGH Harris is a 77 y.o. female who presents today for same-day visit.  Patient notes progressively over several months she has become more exhausted feeling and feels worn out.  Possibly worsened around the time her husband died.  She does note some shortness of breath with walking and has trouble breathing just getting across the street at times.  No orthopnea or PND.  No chest pain.  Feels like her heart is working hard.  No issues while she is at rest.  She saw vascular surgery for lower extremity edema.  Felt to be venous insufficiency related and they recommended lymphedema pumps as well as compression stockings though she is hesitant on those.  She wonders about getting a second opinion.  She has had some trapezius pain on the right side that comes and goes.  Feels like a crick in her neck.  No exertional symptoms.  Moving her neck makes it hurt more.  Patient does note some grief and depression after the loss of her husband a month ago.  She notes no SI.  She just feels down.  She cannot cry.  She wonders if that would be contributing to her heart.  Social History   Tobacco Use  Smoking Status Former Smoker  . Packs/day: 1.00  . Years: 40.00  . Pack years: 40.00  . Types: Cigarettes  . Last attempt to quit: 07/21/1995  . Years since quitting: 22.1  Smokeless Tobacco Never Used     ROS see history of present illness  Objective  Physical Exam Vitals:   09/10/17 1143  BP: 110/70  Temp: 97.8 F (36.6 C)    BP Readings from Last 3 Encounters:  09/10/17 110/70  09/09/17 (!) 158/98  08/17/17 140/90   Wt Readings from Last 3 Encounters:  09/10/17 130 lb 3.2 oz (59.1 kg)  09/09/17 130 lb (59 kg)  08/17/17 123 lb 9.6 oz (56.1 kg)    Physical Exam  Constitutional: No distress.  Cardiovascular: Normal heart sounds. An irregularly irregular rhythm present. Tachycardia present.  Pulmonary/Chest: Effort normal and breath  sounds normal.  Musculoskeletal: She exhibits no edema.  No midline neck tenderness, no midline neck step-off, there is right-sided trapezius tenderness and slight spasm  Neurological: She is alert. Gait normal.  5/5 strength in bilateral biceps, triceps, grip, quads, hamstrings, plantar and dorsiflexion, sensation to light touch intact in bilateral UE and LE  Skin: Skin is warm and dry. She is not diaphoretic.   EKG: Atrial fibrillation with RVR, rate 137  Assessment/Plan: Please see individual problem list.  Atrial fibrillation with RVR ([place]) Patient with new onset atrial fibrillation with RVR.  Symptoms have been going on for some time now though has previously been regular rate and rhythm on exam as recently as yesterday.  I discussed with the patient that she needs evaluation in the emergency department to help slow her heart rate down.  She voiced understanding.  I advised EMS transport though she declined and I discussed the risks of not transporting with EMS.  She will call her daughter to come get her and transport her to the ED.  CMA contacted the charge nurse at the ED to let them know the patient was coming by private vehicle.  Chronic venous insufficiency I suspect this is the cause of her lower leg swelling particularly after she saw vascular surgery.  Encouraged compression stockings though those will not be started until after she is evaluated for her A.  fib.  Trapezius strain, right, initial encounter Suspect trapezius strain.  At this point she will monitor given that she is going to the ED for A. fib evaluation.  Depression, major, single episode, mild ([place]) Suspect mostly related to grief.  She has been in contact with hospice for counseling though has not start this yet.  She notes she will contact them next week for this.   Orders Placed This Encounter  Procedures  . EKG 12-Lead    No orders of the defined types were placed in this encounter.    Dana Rumps,  MD [place]

## 2017-09-10 NOTE — ED Triage Notes (Signed)
First Nurse Note:  Sent by PCP for evaluation of new onset afib.  Patient is AAOx3.  Skin warm and dry. No SOB/ DOE.  NAD

## 2017-09-10 NOTE — Patient Instructions (Signed)
Please go to the ED

## 2017-09-10 NOTE — Progress Notes (Addendum)
Patient came up from ED around 6:30. Vital signs taken, yellow armband, red armband and yellow socks applied. Patient stable in bed with no complaints. Daughter at bedside. Was unable to complete the admission profile. Night shift RN notified and made aware. Nursing will continue to monitor.

## 2017-09-10 NOTE — Assessment & Plan Note (Signed)
Suspect mostly related to grief.  She has been in contact with hospice for counseling though has not start this yet.  She notes she will contact them next week for this.

## 2017-09-10 NOTE — ED Triage Notes (Signed)
Pt c/o increased  SOB over the past week. States she was seen today due to swelling in the lower extremities and has been seeing the venous doc and going to PT. States she has been under a lot of stress states her husband pasted away about a month ago.

## 2017-09-10 NOTE — Assessment & Plan Note (Signed)
Patient with new onset atrial fibrillation with RVR.  Symptoms have been going on for some time now though has previously been regular rate and rhythm on exam as recently as yesterday.  I discussed with the patient that she needs evaluation in the emergency department to help slow her heart rate down.  She voiced understanding.  I advised EMS transport though she declined and I discussed the risks of not transporting with EMS.  She will call her daughter to come get her and transport her to the ED.  CMA contacted the charge nurse at the ED to let them know the patient was coming by private vehicle.

## 2017-09-10 NOTE — Assessment & Plan Note (Signed)
I suspect this is the cause of her lower leg swelling particularly after she saw vascular surgery.  Encouraged compression stockings though those will not be started until after she is evaluated for her A. fib.

## 2017-09-10 NOTE — Assessment & Plan Note (Signed)
Suspect trapezius strain.  At this point she will monitor given that she is going to the ED for A. fib evaluation.

## 2017-09-10 NOTE — ED Provider Notes (Addendum)
[facility] Emergency Department Provider Note   ____________________________________________   First MD Initiated Contact with Patient 09/10/17 1333     (approximate)  I have reviewed the triage vital signs and the nursing notes.   HISTORY  Chief Complaint Shortness of Breath   HPI [name] is a 77 y.o. female reports she's been feeling short of breath for about a week. It's been getting somewhat worse. She says her legs are swelling. She has difficulty going to getting her mail because she short of breath on the way down her back. She is having occasional pain on the right side of her back in the upper back. Lasts for about half an hour and goes away does not appear to be associated with exertion. Patient reports her husband died a month ago today.  Past Medical History:  Diagnosis Date  . Breast cancer ([place]) 2001   left breast  . Cancer ([place]) 2001   left breast ca  . GERD (gastroesophageal reflux disease)   . Hyperlipidemia   . Hypertension   . Osteoarthritis, multiple sites   . Osteoarthrosis involving, or with mention of more than one site, but not specified as generalized, multiple sites   . Osteopenia   . Personal history of radiation therapy 2001   left breast ca  . Psoriasis     Patient Active Problem List   Diagnosis Date Noted  . Atrial fibrillation with RVR ([place]) 09/10/2017  . Trapezius strain, right, initial encounter 09/10/2017  . Depression, major, single episode, mild ([place]) 09/10/2017  . Chronic venous insufficiency 09/09/2017  . Lymphedema 09/09/2017  . Pain of toe of left foot 08/17/2017  . Grief 08/17/2017  . Epistaxis 07/31/2017  . Leg swelling 07/31/2017  . Dry eyes 07/06/2017  . Pain and swelling of lower extremity, right 03/29/2017  . Right shoulder pain 03/29/2017  . Diarrhea 05/18/2016  . Fall 05/01/2016  . Unintentional weight loss 12/30/2015  . Leg weakness, bilateral 12/30/2015  . Hyperkalemia  01/01/2015  . Occasional numbness/prickling/tingling of fingers and toes 12/18/2014  . Routine general medical examination at a health care facility 12/13/2013  . Macrocytic anemia 05/19/2013  . 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 Psoriasis   . Osteoarthritis, multiple sites   . Episodic mood disorder ([place]) 12/01/2011  . History of breast cancer 10/28/2011  . GERD (gastroesophageal reflux disease)   . Hyperlipidemia   . Hypertension   . Osteopenia   . Carotid stenosis 05/27/2011    Past Surgical History:  Procedure Laterality Date  . ABDOMINAL HYSTERECTOMY    . ANKLE FRACTURE SURGERY  4/08   left---hardware still in place  . 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 BREAST EXCISIONAL BIOPSY Left yrs ago   benign  . BREAST LUMPECTOMY  2001   left  . CAROTID ENDARTERECTOMY Left 10/23/2004  . FRACTURE SURGERY    . SHOULDER SURGERY  6/07   left  . TONSILLECTOMY AND ADENOIDECTOMY    . TOTAL HIP ARTHROPLASTY  2004   right    Prior to Admission medications   Medication Sig Start Date End Date Taking? Authorizing Provider  aspirin 81 MG tablet Take 81 mg by mouth daily.   Yes [provider]  Cholecalciferol (VITAMIN D3) 1000 units CAPS Take 1 capsule by mouth 2 (two) times daily.    Yes [provider]  gentamicin cream (GARAMYCIN) 0.1 % Apply 1 application topically 3 (three) times daily. 08/24/17  Yes Daylene Katayama  M, DPM  metoprolol tartrate (LOPRESSOR) 50 MG tablet Take 0.5 tablets (25 mg total) by mouth 2 (two) times daily. 08/28/17  Yes [name], MD  mirtazapine (REMERON) 15 MG tablet TAKE 1 TABLET (15 MG TOTAL) BY MOUTH AT BEDTIME. 07/06/17  Yes [name], MD  Multiple Vitamin (MULTIVITAMIN) capsule Take 1 capsule by mouth daily.     Yes [provider]  Multiple Vitamins-Minerals (PRESERVISION AREDS 2) CAPS Take 1 tablet by mouth 2 (two) times daily.   Yes [provider]  LORazepam (ATIVAN) 0.5 MG tablet TAKE 1 TABLET BY  MOUTH 2 TIMES DAILY AS NEEDED FOR ANXIETY 06/25/17   [name], MD    Allergies Wellbutrin [bupropion]  Family History  Problem Relation Age of Onset  . Hypertension Brother   . Pneumonia Mother   . Leukemia Father   . Heart disease Neg Hx   . Diabetes Neg Hx     Social History Social History   Tobacco Use  . Smoking status: Former Smoker    Packs/day: 1.00    Years: 40.00    Pack years: 40.00    Types: Cigarettes    Last attempt to quit: 07/21/1995    Years since quitting: 22.1  . Smokeless tobacco: Never Used  Substance Use Topics  . Alcohol use: Yes    Alcohol/week: 9.6 oz    Types: 2 Glasses of wine, 14 Standard drinks or equivalent per week    Comment: daily  . Drug use: No    Review of Systems  Constitutional: No fever/chills Eyes: No visual changes. ENT: No sore throat. Cardiovascular: Denies chest pain. Respiratory:  shortness of breath. Gastrointestinal: No abdominal pain.  No nausea, no vomiting.  No diarrhea.  No constipation. Genitourinary: Negative for dysuria. Musculoskeletal: Negative for back pain. Skin: Negative for rash. Neurological: Negative for headaches, focal weakness ____________________________________________   PHYSICAL EXAM:  VITAL SIGNS: ED Triage Vitals  Enc Vitals Group     BP 09/10/17 1328 (!) 151/100     Pulse Rate 09/10/17 1328 (!) 138     Resp 09/10/17 1328 18     Temp 09/10/17 1328 97.6 F (36.4 C)     Temp Source 09/10/17 1328 Axillary     SpO2 09/10/17 1328 96 %     Weight 09/10/17 1329 130 lb (59 kg)     Height 09/10/17 1329 5\' 6"  (1.676 m)     Head Circumference --      Peak Flow --      Pain Score --      Pain Loc --      Pain Edu? --      Excl. in [place]? --     Constitutional: Alert and oriented. Well appearing and in no acute distress. Eyes: Conjunctivae are normal.  Head: Atraumatic. Nose: No congestion/rhinnorhea. Mouth/Throat: Mucous membranes are moist.  Oropharynx non-erythematous. Neck: No  stridor.   Cardiovascular: rapidl rate, irregularly irregular rhythm. Grossly normal heart sounds.  Good peripheral circulation. Respiratory: Normal respiratory effort.  No retractions. Lungs CTAB. Gastrointestinal: Soft and nontender. No distention. No abdominal bruits. No CVA tenderness. Musculoskeletal: No lower extremity tenderness he does however have at least one plus edema bilaterally  No joint effusions. Neurologic:  Normal speech and language. No gross focal neurologic deficits are appreciated.  Skin:  Skin is warm, dry and intact. No rash noted. Psychiatric: Mood and affect are normal. Speech and behavior are normal.  ____________________________________________   LABS (all labs ordered are  listed, but only abnormal results are displayed)  Labs Reviewed  BASIC METABOLIC PANEL - Abnormal; Notable for the following components:      Result Value   BUN 22 (*)    All other components within normal limits  CBC - Abnormal; Notable for the following components:   RBC 3.59 (*)    MCV 107.5 (*)    MCH 35.7 (*)    All other components within normal limits  TSH  TROPONIN I   ____________________________________________  EKG  EKG shows A. fib with RVR at a rate of 142 normal axis no acute ST-T wave changes ____________________________________________  RADIOLOGY  ED MD interpretation: x-ray read and interpreted by me shows congestive heart failure  Official radiology report(s): Dg Chest Portable 1 View  Result Date: 09/10/2017 CLINICAL DATA:  Increasing shortness of breath EXAM: PORTABLE CHEST 1 VIEW COMPARISON:  08/06/2008 FINDINGS: Cardiac enlargement noted. Mild aortic atherosclerosis. There are mild-to-moderate bilateral pleural effusions and mild interstitial edema. No airspace consolidation. The visualized osseous structures are unremarkable. IMPRESSION: 1. Mild congestive heart failure. Electronically Signed   By: Kerby Moors M.D.   On: 09/10/2017 14:09     ____________________________________________   PROCEDURES  Procedure(s) performed:   Procedures  Critical Care performed:  critical-care time one half hour including review of patient's old records speaking to the patient in the hospital physician. ____________________________________________   INITIAL IMPRESSION / [place] / ED COURSE review of patient's old records shows no mention of atrial fibrillation. Patient does not remember hearing atrial fibrillation before. This appears to be new onset.          ____________________________________________   FINAL CLINICAL IMPRESSION(S) / ED DIAGNOSES  Final diagnoses:  Atrial fibrillation with RVR ([place])  Acute systolic congestive heart failure [facility])     ED Discharge Orders    None       Note:  This document was prepared using Dragon voice recognition software and may include unintentional dictation errors.    Nena Polio, MD 09/10/17 1629    Nena Polio, MD 09/21/17 [phone]

## 2017-09-11 ENCOUNTER — Inpatient Hospital Stay (HOSPITAL_COMMUNITY)
Admit: 2017-09-11 | Discharge: 2017-09-11 | Disposition: A | Discharge status: Home / Self Care | Payer: Medicare Other | Attending: Internal Medicine | Admitting: Internal Medicine

## 2017-09-11 DIAGNOSIS — I481 Persistent atrial fibrillation: Secondary | ICD-10-CM

## 2017-09-11 DIAGNOSIS — I5031 Acute diastolic (congestive) heart failure: Secondary | ICD-10-CM

## 2017-09-11 DIAGNOSIS — I340 Nonrheumatic mitral (valve) insufficiency: Secondary | ICD-10-CM

## 2017-09-11 LAB — ECHOCARDIOGRAM COMPLETE
FS: 25 % — AB (ref 28–44)
Height: 66 in
IVS/LV PW RATIO, ED: 0.84
LA ID, A-P, ES: 46 mm
LA diam end sys: 46 mm
LA diam index: 2.8 cm/m2
LA vol A4C: 76.4 ml
LA vol index: 47.8 mL/m2
LA vol: 78.6 mL
LV PW d: 12.3 mm — AB (ref 0.6–1.1)
TAPSE: 12.7 mm
Weight: 2056 oz

## 2017-09-11 LAB — CBC
HCT: 37.3 % (ref 35.0–47.0)
Hemoglobin: 12.5 g/dL (ref 12.0–16.0)
MCH: 35.7 pg — ABNORMAL HIGH (ref 26.0–34.0)
MCHC: 33.5 g/dL (ref 32.0–36.0)
MCV: 106.5 fL — ABNORMAL HIGH (ref 80.0–100.0)
Platelets: 209 10*3/uL (ref 150–440)
RBC: 3.5 MIL/uL — ABNORMAL LOW (ref 3.80–5.20)
RDW: 13 % (ref 11.5–14.5)
WBC: 4.7 10*3/uL (ref 3.6–11.0)

## 2017-09-11 LAB — BASIC METABOLIC PANEL
Anion gap: 12 (ref 5–15)
BUN: 17 mg/dL (ref 6–20)
CO2: 26 mmol/L (ref 22–32)
Calcium: 9.1 mg/dL (ref 8.9–10.3)
Chloride: 100 mmol/L — ABNORMAL LOW (ref 101–111)
Creatinine, Ser: 0.98 mg/dL (ref 0.44–1.00)
GFR calc Af Amer: >60 mL/min (ref >60)
GFR calc non Af Amer: 54 mL/min — ABNORMAL LOW (ref >60)
Glucose, Bld: 93 mg/dL (ref 65–99)
Potassium: 3.4 mmol/L — ABNORMAL LOW (ref 3.5–5.1)
Sodium: 138 mmol/L (ref 135–145)

## 2017-09-11 LAB — MAGNESIUM: Magnesium: 1.2 mg/dL — ABNORMAL LOW (ref 1.7–2.4)

## 2017-09-11 LAB — TROPONIN I
Troponin I: <0.03 ng/mL (ref <0.03)
Troponin I: <0.03 ng/mL (ref <0.03)

## 2017-09-11 MED ORDER — POTASSIUM CHLORIDE CRYS ER 20 MEQ PO TBCR
40.0000 meq | EXTENDED_RELEASE_TABLET | Freq: Once | ORAL | Status: AC
  Administered 2017-09-11: 40 meq via ORAL
  Filled 2017-09-11: qty 2

## 2017-09-11 MED ORDER — MAGNESIUM SULFATE 4 GM/100ML IV SOLN
4.0000 g | Freq: Once | INTRAVENOUS | Status: AC
  Administered 2017-09-11: 4 g via INTRAVENOUS
  Filled 2017-09-11: qty 100

## 2017-09-11 MED ORDER — RIVAROXABAN 15 MG PO TABS
15.0000 mg | ORAL_TABLET | Freq: Every day | ORAL | Status: DC
  Administered 2017-09-11: 15 mg via ORAL
  Filled 2017-09-11: qty 1

## 2017-09-11 MED ORDER — LORAZEPAM 1 MG PO TABS
1.0000 mg | ORAL_TABLET | Freq: Two times a day (BID) | ORAL | Status: DC | PRN
  Administered 2017-09-12: 1 mg via ORAL
  Filled 2017-09-11: qty 1

## 2017-09-11 MED ORDER — DILTIAZEM HCL ER 60 MG PO CP12
60.0000 mg | ORAL_CAPSULE | Freq: Two times a day (BID) | ORAL | Status: DC
  Administered 2017-09-11 – 2017-09-12 (×3): 60 mg via ORAL
  Filled 2017-09-11 (×4): qty 1

## 2017-09-11 NOTE — Consult Note (Signed)
Cardiology Consultation:   Patient ID: TARRIN MENN; [id number]; 1940-08-22   Admit date: 09/10/2017 Date of Consult: 09/11/2017  Primary Care Provider: [name], MD Primary Cardiologist: New Primary Electrophysiologist: None   Patient Profile:   [name] is a 77 y.o. female with a hx of hypertension who is being seen today for the evaluation of atrial fibrillation and new onset congestive heart failure at the request of Dr. Bridgett Larsson.  History of Present Illness:   [name] very pleasant 77 year old woman whose husband had seen Dr. Jolyn Nap in the past.  He died a month ago.  The patient has noted progressive lower extremity swelling and shortness of breath for several months.  She was in physical therapy where she noted worsening of her symptoms and was subsequently found to have atrial fibrillation with a rapid ventricular response, and is admitted for additional evaluation.  She had no awareness of her atrial fibrillation denying palpitations or chest pain.  Her peripheral edema and shortness of breath have both been present for many weeks.  She has not had syncope.  Subsequent evaluation has demonstrated no increase in cardiac enzymes, a normal TSH, normal renal function, and no evidence of anemia.  Past Medical History:  Diagnosis Date  . Breast cancer ([place]) 2001   left breast  . Cancer ([place]) 2001   left breast ca  . GERD (gastroesophageal reflux disease)   . Hyperlipidemia   . Hypertension   . Osteoarthritis, multiple sites   . Osteoarthrosis involving, or with mention of more than one site, but not specified as generalized, multiple sites   . Osteopenia   . Personal history of radiation therapy 2001   left breast ca  . Psoriasis     Past Surgical History:  Procedure Laterality Date  . ABDOMINAL HYSTERECTOMY    . ANKLE FRACTURE SURGERY  4/08   left---hardware still in place  . 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 BREAST EXCISIONAL BIOPSY  Left yrs ago   benign  . BREAST LUMPECTOMY  2001   left  . CAROTID ENDARTERECTOMY Left 10/23/2004  . FRACTURE SURGERY    . SHOULDER SURGERY  6/07   left  . TONSILLECTOMY AND ADENOIDECTOMY    . TOTAL HIP ARTHROPLASTY  2004   right     Home Medications:  Prior to Admission medications   Medication Sig Start Date End Date Taking? Authorizing Provider  aspirin 81 MG tablet Take 81 mg by mouth daily.   Yes [provider]  Cholecalciferol (VITAMIN D3) 1000 units CAPS Take 1 capsule by mouth 2 (two) times daily.    Yes [provider]  gentamicin cream (GARAMYCIN) 0.1 % Apply 1 application topically 3 (three) times daily. 08/24/17  Yes Edrick Kins, DPM  metoprolol tartrate (LOPRESSOR) 50 MG tablet Take 0.5 tablets (25 mg total) by mouth 2 (two) times daily. 08/28/17  Yes [name], MD  mirtazapine (REMERON) 15 MG tablet TAKE 1 TABLET (15 MG TOTAL) BY MOUTH AT BEDTIME. 07/06/17  Yes [name], MD  Multiple Vitamin (MULTIVITAMIN) capsule Take 1 capsule by mouth daily.     Yes [provider]  Multiple Vitamins-Minerals (PRESERVISION AREDS 2) CAPS Take 1 tablet by mouth 2 (two) times daily.   Yes [provider]  LORazepam (ATIVAN) 0.5 MG tablet TAKE 1 TABLET BY MOUTH 2 TIMES DAILY AS NEEDED FOR ANXIETY 06/25/17   [name], MD    Inpatient Medications: Scheduled  Meds: . aspirin EC  81 mg Oral Daily  . cholecalciferol  1,000 Units Oral BID  . diltiazem  30 mg Oral Q6H  . furosemide  40 mg Intravenous Daily  . LORazepam  1 mg Oral BID  . metoprolol tartrate  25 mg Oral BID  . mirtazapine  15 mg Oral QHS  . multivitamin with minerals  1 tablet Oral Daily  . multivitamin-lutein  1 capsule Oral BID  . rivaroxaban  20 mg Oral Q supper  . sodium chloride flush  3 mL Intravenous Q12H   Continuous Infusions: . sodium chloride    . diltiazem (CARDIZEM) infusion Stopped (09/10/17 2256)   PRN Meds: sodium chloride, acetaminophen  **OR** acetaminophen, ondansetron **OR** ondansetron (ZOFRAN) IV, senna-docusate, sodium chloride flush  Allergies:   No Active Allergies  Social History:   Social History   Socioeconomic History  . Marital status: Widowed    Spouse name: Not on file  . Number of children: Not on file  . Years of education: Not on file  . Highest education level: Not on file  Social Needs  . Financial resource strain: Not on file  . Food insecurity - worry: Not on file  . Food insecurity - inability: Not on file  . Transportation needs - medical: Not on file  . Transportation needs - non-medical: Not on file  Occupational History  . Occupation: Pharmacologist    Comment: Retired  Tobacco Use  . Smoking status: Former Smoker    Packs/day: 1.00    Years: 40.00    Pack years: 40.00    Types: Cigarettes    Last attempt to quit: 07/21/1995    Years since quitting: 22.1  . Smokeless tobacco: Never Used  Substance and Sexual Activity  . Alcohol use: Yes    Alcohol/week: 9.6 oz    Types: 2 Glasses of wine, 14 Standard drinks or equivalent per week    Comment: daily  . Drug use: No  . Sexual activity: No  Other Topics Concern  . Not on file  Social History Narrative   1 natural child   3 adopted children   Artist---still teaches water colors   Husband has Alzheimers      Has living will   Daughter Amy is health care POA   DNR    No tube feeds if cognitively unaware    Family History:    Family History  Problem Relation Age of Onset  . Hypertension Brother   . Pneumonia Mother   . Leukemia Father   . Heart disease Neg Hx   . Diabetes Neg Hx      ROS:  Please see the history of present illness.   All other ROS reviewed and negative.     Physical Exam/Data:   Vitals:   09/10/17 2254 09/11/17 0003 09/11/17 0314 09/11/17 0923  BP: 116/70 123/81 (!) 145/99 133/64  Pulse: 64 63 86 (!) 134  Resp:  17 17 14   Temp:   98.4 F (36.9 C) (!) 97.3 F (36.3 C)  TempSrc:   Oral Oral    SpO2: 95% 91% 95% 95%  Weight:      Height:        Intake/Output Summary (Last 24 hours) at 09/11/2017 1029 Last data filed at 09/11/2017 1002 Gross per 24 hour  Intake 613 ml  Output 2800 ml  Net -2187 ml   Filed Weights   09/10/17 1329 09/10/17 1937  Weight: 130 lb (59 kg) 128 lb 8 oz (  58.3 kg)   Body mass index is 20.74 kg/m.  General:  Well nourished, well developed, in no acute distress HEENT: normal Lymph: no adenopathy Neck: no JVD Endocrine:  No thryomegaly Vascular: No carotid bruits; FA pulses 2+ bilaterally without bruits  Cardiac:  normal S1, S2; IRIRR; no murmur  Lungs:  clear to auscultation bilaterally, no wheezing, rhonchi or rales  Abd: soft, nontender, no hepatomegaly  Ext: 1+ peripheral edema Musculoskeletal:  No deformities, BUE and BLE strength normal and equal Skin: warm and dry  Neuro:  CNs 2-12 intact, no focal abnormalities noted Psych:  Normal affect   EKG:  The EKG was personally reviewed and demonstrates: Atrial fibrillation with a rapid ventricular response Telemetry:  Telemetry was personally reviewed and demonstrates: Atrial fibrillation with a controlled ventricular response  Relevant CV Studies: 2D echo is pending  Laboratory Data:  Chemistry Recent Labs  Lab 09/10/17 1354 09/11/17 0654  NA 140 138  K 4.4 3.4*  CL 105 100*  CO2 23 26  GLUCOSE 88 93  BUN 22* 17  CREATININE 0.86 0.98  CALCIUM 9.6 9.1  GFRNONAA >60 54*  GFRAA >60 >60  ANIONGAP 12 12    No results for input(s): PROT, ALBUMIN, AST, ALT, ALKPHOS, BILITOT in the last 168 hours. Hematology Recent Labs  Lab 09/10/17 1354 09/11/17 0654  WBC 7.8 4.7  RBC 3.59* 3.50*  HGB 12.8 12.5  HCT 38.6 37.3  MCV 107.5* 106.5*  MCH 35.7* 35.7*  MCHC 33.2 33.5  RDW 13.1 13.0  PLT 249 209   Cardiac Enzymes Recent Labs  Lab 09/10/17 1354 09/10/17 1848 09/11/17 0031 09/11/17 0654  TROPONINI <0.03 <0.03 <0.03 <0.03   No results for input(s): TROPIPOC in the last  168 hours.  BNPNo results for input(s): BNP, PROBNP in the last 168 hours.  DDimer No results for input(s): DDIMER in the last 168 hours.  Radiology/Studies:  Dg Chest Portable 1 View  Result Date: 09/10/2017 CLINICAL DATA:  Increasing shortness of breath EXAM: PORTABLE CHEST 1 VIEW COMPARISON:  08/06/2008 FINDINGS: Cardiac enlargement noted. Mild aortic atherosclerosis. There are mild-to-moderate bilateral pleural effusions and mild interstitial edema. No airspace consolidation. The visualized osseous structures are unremarkable. IMPRESSION: 1. Mild congestive heart failure. Electronically Signed   By: Kerby Moors M.D.   On: 09/10/2017 14:09    Assessment and Plan:   1. New onset atrial fibrillation -her ventricular rate is now controlled.  She will be transitioned to AV nodal blocking drugs.  She will be continued on oral anticoagulant therapy.  She will require 24-48 additional hours of medical therapy to assure rate control.  We will transition her to a long-acting AV nodal blocking agents. 2. Acute congestive heart failure -it is unclear whether she has systolic or diastolic dysfunction.  Approximately 13 years ago her 2D echo demonstrated preserved left ventricular systolic function.  A repeat echo is pending.  Additional medical therapy will be recommended including diuretic therapy, and blood pressure control. 3. Coags -she does have a history of falls, but these have gotten better with physical therapy.  Hopefully her following will be minimal while on anticoagulation.   For questions or updates, please contact [place] Please consult [email or web address] for contact info under Cardiology/STEMI.   Signed, Cristopher Peru, MD  09/11/2017 10:29 AM

## 2017-09-11 NOTE — Progress Notes (Signed)
xarelto dose decreased from 20mg  daily to 15mg  daily due to TBW crcl <52ml/min  Lamin Chandley D Tylena Prisk, Pharm.D, BCPS Clinical Pharmacist

## 2017-09-11 NOTE — Progress Notes (Signed)
[facility] [place] NAME: [name]    MR#:  [id number]  DATE OF BIRTH:  03-30-1941  SUBJECTIVE:  CHIEF COMPLAINT:   Chief Complaint  Patient presents with  . Shortness of Breath   Better shortness of breath and leg edema.  Heart rate is better controlled, off Cardizem drip. REVIEW OF SYSTEMS:  Review of Systems  Constitutional: Negative for chills, fever and malaise/fatigue.  HENT: Negative for sore throat.   Eyes: Negative for blurred vision and double vision.  Respiratory: Negative for cough, hemoptysis, shortness of breath, wheezing and stridor.   Cardiovascular: Positive for leg swelling. Negative for chest pain, palpitations and orthopnea.  Gastrointestinal: Negative for abdominal pain, blood in stool, diarrhea, melena, nausea and vomiting.  Genitourinary: Negative for dysuria, flank pain and hematuria.  Musculoskeletal: Negative for back pain and joint pain.  Skin: Negative for rash.  Neurological: Negative for dizziness, sensory change, focal weakness, seizures, loss of consciousness, weakness and headaches.  Endo/Heme/Allergies: Negative for polydipsia.  Psychiatric/Behavioral: Negative for depression. The patient is not nervous/anxious.     DRUG ALLERGIES:  No Active Allergies VITALS:  Blood pressure 133/64, pulse 99, temperature (!) 97.3 F (36.3 C), temperature source Oral, resp. rate 14, height 5\' 6"  (1.676 m), weight 128 lb 8 oz (58.3 kg), SpO2 95 %. PHYSICAL EXAMINATION:  Physical Exam  Constitutional: She is oriented to person, place, and time and well-developed, well-nourished, and in no distress.  HENT:  Head: Normocephalic.  Mouth/Throat: Oropharynx is clear and moist.  Eyes: Conjunctivae and EOM are normal. Pupils are equal, round, and reactive to light. No scleral icterus.  Neck: Normal range of motion. Neck supple. No JVD present. No tracheal deviation present.  Cardiovascular: Exam reveals no  gallop.  No murmur heard. Irregular rate and rhythm  Pulmonary/Chest: Effort normal and breath sounds normal. No respiratory distress. She has no wheezes. She has no rales.  Abdominal: Soft. Bowel sounds are normal. She exhibits no distension. There is no tenderness. There is no rebound.  Musculoskeletal: Normal range of motion. She exhibits edema. She exhibits no tenderness.  Neurological: She is alert and oriented to person, place, and time. No cranial nerve deficit.  Skin: No rash noted. No erythema.  Psychiatric: Affect normal.   LABORATORY PANEL:  Female CBC Recent Labs  Lab 09/11/17 0654  WBC 4.7  HGB 12.5  HCT 37.3  PLT 209   ------------------------------------------------------------------------------------------------------------------ Chemistries  Recent Labs  Lab 09/11/17 0031 09/11/17 0654  NA  --  138  K  --  3.4*  CL  --  100*  CO2  --  26  GLUCOSE  --  93  BUN  --  17  CREATININE  --  0.98  CALCIUM  --  9.1  MG 1.2*  --    RADIOLOGY:  No results found. ASSESSMENT AND PLAN:   77 year old elderly female patient with history of breast cancer, GERD, hyperlipidemia, hypertension presented to the emergency room with shortness of breath.  1.  New onset atrial fibrillation with RVR. The patient is off Cardizem drip, changed to Cardizem and Lopressor by cardiologist at Dr. Lovena Le. Continue Xarelto.  2 new onset diastolic congestive heart failure. LV EF: 55% -   60% Continue Lasix IV and follow CHF protocol.  Follow-up BMP.  3.  Hyperlipidemia.  4.  Hypertension.  Continue Lopressor and Cardizem. Hypokalemia.  Potassium supplement. Hypomagnesemia.  IV magnesium.  All the records are reviewed and case discussed with  Care Management/Social Worker. Management plans discussed with the patient, her daughter, and they are in agreement.  CODE STATUS: Full Code  TOTAL TIME TAKING CARE OF THIS PATIENT: 33 minutes.   More than 50% of the time was spent in  counseling/coordination of care: YES  POSSIBLE D/C IN 1-2 DAYS, DEPENDING ON CLINICAL CONDITION.   Demetrios Loll M.D on 09/11/2017 at 4:44 PM  Between 7am to 6pm - Pager - [phone]  After 6pm go to [email or web address] - [facility]

## 2017-09-11 NOTE — Plan of Care (Signed)
Pt was on cardizem gtt @ 15 beginning of shift, stopped at 2256 due to heart rate dropping to the 40's, pt asymptomatic. PO cardizem at midnight also held, per Dr. Jannifer Franklin. 6am dose given. Pt up with standby assist, tolerating well. Gave tylenol for stiff neck last night.

## 2017-09-12 LAB — LIPID PANEL
Cholesterol: 152 mg/dL (ref 0–200)
HDL: 72 mg/dL (ref >40)
LDL Cholesterol: 60 mg/dL (ref 0–99)
Total CHOL/HDL Ratio: 2.1 RATIO
Triglycerides: 101 mg/dL (ref <150)
VLDL: 20 mg/dL (ref 0–40)

## 2017-09-12 LAB — BASIC METABOLIC PANEL
Anion gap: 10 (ref 5–15)
BUN: 16 mg/dL (ref 6–20)
CO2: 27 mmol/L (ref 22–32)
Calcium: 9.2 mg/dL (ref 8.9–10.3)
Chloride: 98 mmol/L — ABNORMAL LOW (ref 101–111)
Creatinine, Ser: 0.8 mg/dL (ref 0.44–1.00)
GFR calc Af Amer: >60 mL/min (ref >60)
GFR calc non Af Amer: >60 mL/min (ref >60)
Glucose, Bld: 124 mg/dL — ABNORMAL HIGH (ref 65–99)
Potassium: 3.9 mmol/L (ref 3.5–5.1)
Sodium: 135 mmol/L (ref 135–145)

## 2017-09-12 LAB — MAGNESIUM: Magnesium: 1.8 mg/dL (ref 1.7–2.4)

## 2017-09-12 MED ORDER — RIVAROXABAN 15 MG PO TABS: 15.0000 mg | ORAL_TABLET | Freq: Every day | ORAL | 0 refills | Status: DC

## 2017-09-12 MED ORDER — DILTIAZEM HCL ER 60 MG PO CP12: 60.0000 mg | ORAL_CAPSULE | Freq: Two times a day (BID) | ORAL | 0 refills | Status: DC

## 2017-09-12 MED ORDER — FUROSEMIDE 40 MG PO TABS
40.0000 mg | ORAL_TABLET | Freq: Every day | ORAL | Status: DC
  Administered 2017-09-12: 40 mg via ORAL
  Filled 2017-09-12: qty 1

## 2017-09-12 MED ORDER — FUROSEMIDE 40 MG PO TABS: 40.0000 mg | ORAL_TABLET | Freq: Every day | ORAL | 0 refills | Status: DC

## 2017-09-12 NOTE — Progress Notes (Signed)
Patient given discharge instructions with printed prescriptions, as well as xarelto coupon. Both IV's taken out and tele monitor off. Pt and daughter at bedside verbalized understanding with all questions answered. Patient transported down in wheelchair going home in family vehicle.

## 2017-09-12 NOTE — Plan of Care (Signed)
  Adequate for Discharge Health Behavior/Discharge Planning: Ability to manage health-related needs will improve 09/12/2017 1047 - Adequate for Discharge by Feliberto Gottron, RN Clinical Measurements: Ability to maintain clinical measurements within normal limits will improve 09/12/2017 1047 - Adequate for Discharge by Chriss Czar, Illene Bolus, RN Will remain free from infection 09/12/2017 1047 - Adequate for Discharge by Feliberto Gottron, RN Diagnostic test results will improve 09/12/2017 1047 - Adequate for Discharge by Feliberto Gottron, RN Respiratory complications will improve 09/12/2017 1047 - Adequate for Discharge by Feliberto Gottron, RN Cardiovascular complication will be avoided 09/12/2017 1047 - Adequate for Discharge by Feliberto Gottron, RN Activity: Risk for activity intolerance will decrease 09/12/2017 1047 - Adequate for Discharge by Feliberto Gottron, RN Nutrition: Adequate nutrition will be maintained 09/12/2017 1047 - Adequate for Discharge by Feliberto Gottron, RN Coping: Level of anxiety will decrease 09/12/2017 1047 - Adequate for Discharge by Chriss Czar, Illene Bolus, RN Elimination: Will not experience complications related to bowel motility 09/12/2017 1047 - Adequate for Discharge by Chriss Czar, Illene Bolus, RN Will not experience complications related to urinary retention 09/12/2017 1047 - Adequate for Discharge by Feliberto Gottron, RN Pain Managment: General experience of comfort will improve 09/12/2017 1047 - Adequate for Discharge by Feliberto Gottron, RN Safety: Ability to remain free from injury will improve 09/12/2017 1047 - Adequate for Discharge by Chriss Czar, Illene Bolus, RN Skin Integrity: Risk for impaired skin integrity will decrease 09/12/2017 1047 - Adequate for Discharge by Feliberto Gottron, RN Education: Knowledge of disease or condition will improve 09/12/2017 1047 -  Adequate for Discharge by Feliberto Gottron, RN Understanding of medication regimen will improve 09/12/2017 1047 - Adequate for Discharge by Feliberto Gottron, RN Activity: Ability to tolerate increased activity will improve 09/12/2017 1047 - Adequate for Discharge by Feliberto Gottron, RN Cardiac: Ability to achieve and maintain adequate cardiopulmonary perfusion will improve 09/12/2017 1047 - Adequate for Discharge by Feliberto Gottron, RN Health Behavior/Discharge Planning: Ability to safely manage health-related needs after discharge will improve 09/12/2017 1047 - Adequate for Discharge by Feliberto Gottron, RN

## 2017-09-12 NOTE — Progress Notes (Signed)
Progress Note  Patient Name: [name] Date of Encounter: 09/12/2017  Primary Cardiologist: new  Subjective   Denies chest pain and shortness of breath.  Peripheral edema resolved  Inpatient Medications    Scheduled Meds: . aspirin EC  81 mg Oral Daily  . cholecalciferol  1,000 Units Oral BID  . diltiazem  60 mg Oral Q12H  . furosemide  40 mg Oral Daily  . metoprolol tartrate  25 mg Oral BID  . mirtazapine  15 mg Oral QHS  . multivitamin with minerals  1 tablet Oral Daily  . multivitamin-lutein  1 capsule Oral BID  . rivaroxaban  15 mg Oral Q supper  . sodium chloride flush  3 mL Intravenous Q12H   Continuous Infusions: . sodium chloride     PRN Meds: sodium chloride, acetaminophen **OR** acetaminophen, LORazepam, ondansetron **OR** ondansetron (ZOFRAN) IV, senna-docusate, sodium chloride flush   Vital Signs    Vitals:   09/11/17 0923 09/11/17 2007 09/12/17 0555 09/12/17 0944  BP: 133/64 100/74 (!) 133/92 121/86  Pulse: 99 (!) 101 (!) 109 77  Resp: 14 17 17 18   Temp: (!) 97.3 F (36.3 C) 98.2 F (36.8 C) 98.3 F (36.8 C) (!) 97.3 F (36.3 C)  TempSrc: Oral Oral Oral Oral  SpO2: 95% 93% 97% 95%  Weight:      Height:        Intake/Output Summary (Last 24 hours) at 09/12/2017 1057 Last data filed at 09/12/2017 0955 Gross per 24 hour  Intake 483 ml  Output 1300 ml  Net -817 ml   Filed Weights   09/10/17 1329 09/10/17 1937  Weight: 130 lb (59 kg) 128 lb 8 oz (58.3 kg)    Telemetry    Atrial fibrillation with a controlled ventricular response- Personally Reviewed  ECG    Atrial fibrillation with a controlled ventricular response- Personally Reviewed  Physical Exam   GEN: No acute distress.   Neck: No JVD Cardiac: IRIRR, no murmurs, rubs, or gallops.  Respiratory: Clear to auscultation bilaterally. GI: Soft, nontender, non-distended  MS: No edema; No deformity. Neuro:  Nonfocal  Psych: Normal affect   Labs    Chemistry Recent Labs    Lab 09/10/17 1354 09/11/17 0654 09/12/17 0734  NA 140 138 135  K 4.4 3.4* 3.9  CL 105 100* 98*  CO2 23 26 27   GLUCOSE 88 93 124*  BUN 22* 17 16  CREATININE 0.86 0.98 0.80  CALCIUM 9.6 9.1 9.2  GFRNONAA >60 54* >60  GFRAA >60 >60 >60  ANIONGAP 12 12 10      Hematology Recent Labs  Lab 09/10/17 1354 09/11/17 0654  WBC 7.8 4.7  RBC 3.59* 3.50*  HGB 12.8 12.5  HCT 38.6 37.3  MCV 107.5* 106.5*  MCH 35.7* 35.7*  MCHC 33.2 33.5  RDW 13.1 13.0  PLT 249 209    Cardiac Enzymes Recent Labs  Lab 09/10/17 1354 09/10/17 1848 09/11/17 0031 09/11/17 0654  TROPONINI <0.03 <0.03 <0.03 <0.03   No results for input(s): TROPIPOC in the last 168 hours.   BNPNo results for input(s): BNP, PROBNP in the last 168 hours.   DDimer No results for input(s): DDIMER in the last 168 hours.   Radiology    Dg Chest Portable 1 View  Result Date: 09/10/2017 CLINICAL DATA:  Increasing shortness of breath EXAM: PORTABLE CHEST 1 VIEW COMPARISON:  08/06/2008 FINDINGS: Cardiac enlargement noted. Mild aortic atherosclerosis. There are mild-to-moderate bilateral pleural effusions and mild interstitial edema. No airspace consolidation.  The visualized osseous structures are unremarkable. IMPRESSION: 1. Mild congestive heart failure. Electronically Signed   By: Kerby Moors M.D.   On: 09/10/2017 14:09    Cardiac Studies   2D echo demonstrates preserved left ventricular systolic function, mild left atrial enlargement, 46 mm  Patient Profile     77 y.o. female admitted with new onset atrial fibrillation and diastolic heart failure  Assessment & Plan    1.  New onset atrial fibrillation -her ventricular rate is controlled and she is tolerated systemic anticoagulation.  She is stable for discharge and will follow up with Dr. Caryl Comes. 2.  Acute diastolic heart failure -this is secondary to her atrial fibrillation.  Her symptoms have resolved.  She is encouraged to maintain a low-sodium diet.   Additional diuretic therapy will depend on how well she is able to maintain her volume status. 3.  Systemic anticoagulation -she is tolerating her reduced dose Xarelto.  For questions or updates, please contact [place] Please consult [email or web address] for contact info under Cardiology/STEMI.      Signed, Cristopher Peru, MD  09/12/2017, 10:57 AM  Patient ID: [name], female   DOB: June 22, 1941, 77 y.o.   MRN: [id number]

## 2017-09-12 NOTE — Discharge Instructions (Signed)
Heart healthy diet

## 2017-09-12 NOTE — Discharge Summary (Signed)
[facility] [place] NAME: [name]    MR#:  [id number]  DATE OF BIRTH:  1940-12-13  DATE OF ADMISSION:  09/10/2017   ADMITTING PHYSICIAN: Saundra Shelling, MD  DATE OF DISCHARGE: 09/12/2017 11:52 AM  PRIMARY CARE PHYSICIAN: [name], MD   ADMISSION DIAGNOSIS:  Acute systolic congestive heart failure ([place]) [I50.21] Atrial fibrillation with RVR ([place]) [I48.91] DISCHARGE DIAGNOSIS:  Active Problems:   A-fib ([place])  SECONDARY DIAGNOSIS:   Past Medical History:  Diagnosis Date  . Breast cancer ([place]) 2001   left breast  . Cancer ([place]) 2001   left breast ca  . GERD (gastroesophageal reflux disease)   . Hyperlipidemia   . Hypertension   . Osteoarthritis, multiple sites   . Osteoarthrosis involving, or with mention of more than one site, but not specified as generalized, multiple sites   . Osteopenia   . Personal history of radiation therapy 2001   left breast ca  . Psoriasis    HOSPITAL COURSE:   77 year old elderly female patient with history of breast cancer, GERD, hyperlipidemia, hypertension presented to the emergency room with shortness of breath.  1.New onset atrial fibrillation with RVR. The patient is off Cardizem drip, changed to Cardizem and Lopressor by cardiologist at Dr. Lovena Le. Continue Xarelto, Cardizem and Lopressor, follow-up Dr. Caryl Comes in clinic per Dr. Lovena Le.  2new onset diastolic congestive heart failure. LV EF: 55% - 60% She has been treated with Lasix IV, changed to 40 mg p.o. Daily.  3.Hyperlipidemia.   Lipid panel is unremarkable. 4.Hypertension.  Continue Lopressor and Cardizem. Hypokalemia.    Improved with potassium supplement. Hypomagnesemia.  Improved with IV magnesium.  Discussed with Dr. Lovena Le. DISCHARGE CONDITIONS:  Stable, discharged to home today. CONSULTS OBTAINED:  Treatment Team:  Evans Lance, MD DRUG ALLERGIES:  No Active Allergies DISCHARGE  MEDICATIONS:   Allergies as of 09/12/2017   No Active Allergies     Medication List    TAKE these medications   aspirin 81 MG tablet Take 81 mg by mouth daily.   diltiazem 60 MG 12 hr capsule Commonly known as:  CARDIZEM SR Take 1 capsule (60 mg total) by mouth every 12 (twelve) hours.   furosemide 40 MG tablet Commonly known as:  LASIX Take 1 tablet (40 mg total) by mouth daily. Start taking on:  09/13/2017   gentamicin cream 0.1 % Commonly known as:  GARAMYCIN Apply 1 application topically 3 (three) times daily.   LORazepam 0.5 MG tablet Commonly known as:  ATIVAN TAKE 1 TABLET BY MOUTH 2 TIMES DAILY AS NEEDED FOR ANXIETY   metoprolol tartrate 50 MG tablet Commonly known as:  LOPRESSOR Take 0.5 tablets (25 mg total) by mouth 2 (two) times daily.   mirtazapine 15 MG tablet Commonly known as:  REMERON TAKE 1 TABLET (15 MG TOTAL) BY MOUTH AT BEDTIME.   multivitamin capsule Take 1 capsule by mouth daily.   PRESERVISION AREDS 2 Caps Take 1 tablet by mouth 2 (two) times daily.   Rivaroxaban 15 MG Tabs tablet Commonly known as:  XARELTO Take 1 tablet (15 mg total) by mouth daily with supper.   Vitamin D3 1000 units Caps Take 1 capsule by mouth 2 (two) times daily.        DISCHARGE INSTRUCTIONS:  See AVS.  If you experience worsening of your admission symptoms, develop shortness of breath, life threatening emergency, suicidal or homicidal thoughts you must seek medical attention immediately by calling  911 or calling your MD immediately  if symptoms less severe.  You Must read complete instructions/literature along with all the possible adverse reactions/side effects for all the Medicines you take and that have been prescribed to you. Take any new Medicines after you have completely understood and accpet all the possible adverse reactions/side effects.   Please note  You were cared for by a hospitalist during your hospital stay. If you have any questions about  your discharge medications or the care you received while you were in the hospital after you are discharged, you can call the unit and asked to speak with the hospitalist on call if the hospitalist that took care of you is not available. Once you are discharged, your primary care physician will handle any further medical issues. Please note that NO REFILLS for any discharge medications will be authorized once you are discharged, as it is imperative that you return to your primary care physician (or establish a relationship with a primary care physician if you do not have one) for your aftercare needs so that they can reassess your need for medications and monitor your lab values.    On the day of Discharge:  VITAL SIGNS:  Blood pressure 121/86, pulse 77, temperature (!) 97.3 F (36.3 C), temperature source Oral, resp. rate 18, height 5\' 6"  (1.676 m), weight 128 lb 8 oz (58.3 kg), SpO2 95 %. PHYSICAL EXAMINATION:  GENERAL:  77 y.o.-year-old patient lying in the bed with no acute distress.  EYES: Pupils equal, round, reactive to light and accommodation. No scleral icterus. Extraocular muscles intact.  HEENT: Head atraumatic, normocephalic. Oropharynx and nasopharynx clear.  NECK:  Supple, no jugular venous distention. No thyroid enlargement, no tenderness.  LUNGS: Normal breath sounds bilaterally, no wheezing, rales,rhonchi or crepitation. No use of accessory muscles of respiration.  CARDIOVASCULAR: S1, S2 normal. No murmurs, rubs, or gallops.  ABDOMEN: Soft, non-tender, non-distended. Bowel sounds present. No organomegaly or mass.  EXTREMITIES: No pedal edema, cyanosis, or clubbing.  NEUROLOGIC: Cranial nerves II through XII are intact. Muscle strength 5/5 in all extremities. Sensation intact. Gait not checked.  PSYCHIATRIC: The patient is alert and oriented x 3.  SKIN: No obvious rash, lesion, or ulcer.  DATA REVIEW:   CBC Recent Labs  Lab 09/11/17 0654  WBC 4.7  HGB 12.5  HCT 37.3  PLT  209    Chemistries  Recent Labs  Lab 09/12/17 0734  NA 135  K 3.9  CL 98*  CO2 27  GLUCOSE 124*  BUN 16  CREATININE 0.80  CALCIUM 9.2  MG 1.8     Microbiology Results  Results for orders placed or performed in visit on 06/05/16  Clostridium Difficile by PCR     Status: None   Collection Time: 06/05/16  8:38 AM  Result Value Ref Range Status   Toxigenic C. Difficile by PCR Not Detected Not Detected Final    Comment: This test is for use only with liquid or soft stools; performance characteristics of other clinical specimen types have not been established.   This assay was performed by Cepheid GeneXpert(R) PCR. The performance characteristics of this assay have been determined by [employer]. Performance characteristics refer to the analytical performance of the test.     RADIOLOGY:  No results found.   Management plans discussed with the patient, her daughter and they are in agreement.  CODE STATUS: Full Code   TOTAL TIME TAKING CARE OF THIS PATIENT: 33 minutes.    Sheppard Evens  Chason Mciver M.D on 09/12/2017 at 1:44 PM  Between 7am to 6pm - Pager - [phone]  After 6pm go to [email or web address] - Technical brewer [facility]  Office  [phone]  CC: Primary care physician; [name], MD   Note: This dictation was prepared with Dragon dictation along with smaller phrase technology. Any transcriptional errors that result from this process are unintentional.

## 2017-09-13 ENCOUNTER — Telehealth: Payer: Self-pay | Admitting: *Deleted

## 2017-09-13 ENCOUNTER — Ambulatory Visit: Payer: Medicare Other

## 2017-09-13 ENCOUNTER — Telehealth: Payer: Self-pay

## 2017-09-13 NOTE — Telephone Encounter (Signed)
Copied from [place]. Topic: Appointment Scheduling - Scheduling Inquiry for Clinic >> Sep 13, 2017 10:03 AM [name] wrote: Patient was D/C from hospital on 09/12/17. No available appts with Dr Caryl Bis until 3/13. Per office, they will be in touch with TCM & call pt to sch.   [phone].

## 2017-09-13 NOTE — Telephone Encounter (Signed)
Transition Care Management Follow-up Telephone Call  How have you been since you were released from the hospital? Patient stated she still feels really tired, she was advised by hospitalist this would be expected she said, other than being tired patient says she feels fine.   Do you understand why you were in the hospital? yes   Do you understand the discharge instrcutions? yes  Items Reviewed:  Medications reviewed: yes  Allergies reviewed: yes  Dietary changes reviewed: yes  Referrals reviewed: yes   Functional Questionnaire:   Activities of Daily Living (ADLs):   She states they are independent in the following: ambulation, bathing and hygiene, feeding, continence, grooming, toileting and dressing States they require assistance with the following: No assistance required at this time.   Any transportation issues/concerns?: no   Any patient concerns? no   Confirmed importance and date/time of follow-up visits scheduled: yes   Confirmed with patient if condition begins to worsen call PCP or go to the ER.  Patient was given the Call-a-Nurse line [phone]: yes

## 2017-09-13 NOTE — Telephone Encounter (Signed)
-----   Message from Blain Pais sent at 09/13/2017 12:45 PM EST ----- Regarding: TCM/PH 2/28 10:00 Dr. Caryl Comes

## 2017-09-13 NOTE — Telephone Encounter (Signed)
Patient contacted regarding discharge from [facility] on 09/12/17.   Patient understands to follow up with provider ? On 09/16/17 at 10:00 am at  [facility].  Patient understands discharge instructions? Yes  Patient understands medications and regiment? Yes  Patient understands to bring all medications to this visit? Yes  Patient's only concern was the short acting Diltiazem was difficult to find. Pharmacies are either out of it or its on backorder. She has enough to get her through to her appointment with Dr Caryl Comes to discuss. Advised I will go ahead and make him aware. Routing to Dr Caryl Comes.

## 2017-09-14 ENCOUNTER — Ambulatory Visit (INDEPENDENT_AMBULATORY_CARE_PROVIDER_SITE_OTHER): Payer: Medicare Other | Admitting: Podiatry

## 2017-09-14 ENCOUNTER — Ambulatory Visit: Payer: Self-pay

## 2017-09-14 ENCOUNTER — Encounter: Payer: Self-pay | Admitting: Podiatry

## 2017-09-14 DIAGNOSIS — L97522 Non-pressure chronic ulcer of other part of left foot with fat layer exposed: Secondary | ICD-10-CM | POA: Diagnosis not present

## 2017-09-14 DIAGNOSIS — L97529 Non-pressure chronic ulcer of other part of left foot with unspecified severity: Secondary | ICD-10-CM

## 2017-09-14 DIAGNOSIS — I83025 Varicose veins of left lower extremity with ulcer other part of foot: Secondary | ICD-10-CM

## 2017-09-14 MED ORDER — DOXYCYCLINE HYCLATE 100 MG PO TABS: 100.0000 mg | ORAL_TABLET | Freq: Two times a day (BID) | ORAL | 0 refills | Status: DC

## 2017-09-14 NOTE — Telephone Encounter (Signed)
Patient being seen 09/16/17- will review with Dr. Caryl Comes at that time.

## 2017-09-15 NOTE — Progress Notes (Signed)
   Subjective:  77 year old female presenting today for follow up evaluation of an ulceration to the left third toe. She states the wound has improved but reports pain to the plantar aspect of the toe. She has been applying antibiotic cream and wearing toe crest pads with some improvement. Patient is here for further evaluation and treatment.   Past Medical History:  Diagnosis Date  . Breast cancer ([place]) 2001   left breast  . Cancer ([place]) 2001   left breast ca  . GERD (gastroesophageal reflux disease)   . Hyperlipidemia   . Hypertension   . Osteoarthritis, multiple sites   . Osteoarthrosis involving, or with mention of more than one site, but not specified as generalized, multiple sites   . Osteopenia   . Personal history of radiation therapy 2001   left breast ca  . Psoriasis      Objective/Physical Exam General: The patient is alert and oriented x3 in no acute distress.  Dermatology:  Wound #1 noted to the left third toe measuring 0.4 x 0.4 x 0.2 cm (LxWxD).   To the noted ulceration(s), there is no eschar. There is a moderate amount of slough, fibrin, and necrotic tissue noted. Granulation tissue and wound base is red. There is a minimal amount of serosanguineous drainage noted. There is no exposed bone muscle-tendon ligament or joint. There is no malodor. Periwound integrity is intact. Skin is warm, dry and supple bilateral lower extremities. Nails are tender, long, thickened and dystrophic with subungual debris, consistent with onychomycosis, 1-5 bilateral.   Vascular: Palpable pedal pulses bilaterally. Mild edema noted. Capillary refill within normal limits. Varicosities noted bilateral lower extremities.   Neurological: Epicritic and protective threshold absent bilaterally.   Musculoskeletal Exam: Range of motion within normal limits to all pedal and ankle joints bilateral. Muscle strength 5/5 in all groups bilateral.   Assessment: #1 ulceration left third toe secondary  to venous insufficiency #2 varicosities bilateral lower extremities #3 Onychodystrophic nails 1-5 bilateral with hyperkeratosis of nails.  #4 Onychomycosis of nail due to dermatophyte bilateral #5 Pain in foot bilateral   Plan of Care:  #1 Patient was evaluated. #2 medically necessary excisional debridement including subcutaneous tissue was performed using a tissue nipper and a chisel blade. Excisional debridement of all the necrotic nonviable tissue down to healthy bleeding viable tissue was performed with post-debridement measurements same as pre-. #3 the wound was cleansed with normal saline. #4 Culture taken from wound. #5 Continue using gentamicin cream daily with a Band-Aid.  #6 Continue using toe crest pads to offload toe ulcer.  #7 Prescription for Doxycycline #20 provided to patient.  #8 Return to clinic in 2 weeks.    Edrick Kins, DPM [facility]  Dr. Edrick Kins, [employer]                                        [place], [place] [address]                Office [phone]  Fax [phone]

## 2017-09-16 ENCOUNTER — Encounter: Payer: Self-pay | Admitting: Internal Medicine

## 2017-09-16 ENCOUNTER — Ambulatory Visit: Payer: Medicare Other

## 2017-09-16 ENCOUNTER — Ambulatory Visit (INDEPENDENT_AMBULATORY_CARE_PROVIDER_SITE_OTHER): Payer: Medicare Other | Admitting: Internal Medicine

## 2017-09-16 ENCOUNTER — Other Ambulatory Visit: Payer: Self-pay | Admitting: Family Medicine

## 2017-09-16 VITALS — BP 118/66

## 2017-09-16 VITALS — BP 108/90 | HR 99 | Ht 66.5 in | Wt 123.0 lb

## 2017-09-16 DIAGNOSIS — Z1231 Encounter for screening mammogram for malignant neoplasm of breast: Secondary | ICD-10-CM

## 2017-09-16 DIAGNOSIS — M6281 Muscle weakness (generalized): Secondary | ICD-10-CM

## 2017-09-16 DIAGNOSIS — Z9181 History of falling: Secondary | ICD-10-CM | POA: Diagnosis not present

## 2017-09-16 DIAGNOSIS — R2681 Unsteadiness on feet: Secondary | ICD-10-CM

## 2017-09-16 DIAGNOSIS — I480 Paroxysmal atrial fibrillation: Secondary | ICD-10-CM

## 2017-09-16 LAB — WOUND CULTURE: Organism ID, Bacteria: NONE SEEN

## 2017-09-16 MED ORDER — DILTIAZEM HCL ER COATED BEADS 120 MG PO CP24: 120.0000 mg | ORAL_CAPSULE | Freq: Every day | ORAL | 3 refills | Status: DC

## 2017-09-16 NOTE — H&P (View-Only) (Signed)
Patient Care Team: [name], MD as PCP - General (Family Medicine)   HPI  [name] is a 77 y.o. female Seen in follow-up for recent hospitalization having presented with acute shortness of breath and found to be in rapid atrial fibrillation.  Rate control was accomplished with beta-blockers and calcium blockers and anticoagulation with Xarelto.  She has had some persistent peripheral edema.  Chronic shortness of breath and exercise intolerance.  Edema is better following the initiation of diuresis symptoms of shortness of breath improved with heart rate control  She is [name] his wife.  Son, [name], is an unreturned prodigal.  DATE TEST EF   2/19 Echo   65 % LAE (48/2.8/46)         Date Cr K Hgb  2/19 0.8 3.9 12.5         Thromboembolic risk factors ( age  -2, HTN-1, Vasc disease -1, Gender-1) for a CHADSVASc Score of 5  Records and Results Reviewed hosp records  Past Medical History:  Diagnosis Date  . Breast cancer ([place]) 2001   left breast  . Cancer ([place]) 2001   left breast ca  . GERD (gastroesophageal reflux disease)   . Hyperlipidemia   . Hypertension   . Osteoarthritis, multiple sites   . Osteoarthrosis involving, or with mention of more than one site, but not specified as generalized, multiple sites   . Osteopenia   . Personal history of radiation therapy 2001   left breast ca  . Psoriasis     Past Surgical History:  Procedure Laterality Date  . ABDOMINAL HYSTERECTOMY    . ANKLE FRACTURE SURGERY  4/08   left---hardware still in place  . 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 BREAST EXCISIONAL BIOPSY Left yrs ago   benign  . BREAST LUMPECTOMY  2001   left  . CAROTID ENDARTERECTOMY Left 10/23/2004  . FRACTURE SURGERY    . SHOULDER SURGERY  6/07   left  . TONSILLECTOMY AND ADENOIDECTOMY    . TOTAL HIP ARTHROPLASTY  2004   right    Current Outpatient Medications  Medication Sig Dispense Refill  . aspirin 81 MG tablet  Take 81 mg by mouth daily.    . Cholecalciferol (VITAMIN D3) 1000 units CAPS Take 1 capsule by mouth 2 (two) times daily.     Marland Kitchen diltiazem (CARDIZEM SR) 60 MG 12 hr capsule Take 1 capsule (60 mg total) by mouth every 12 (twelve) hours. 60 capsule 0  . doxycycline (VIBRA-TABS) 100 MG tablet Take 1 tablet (100 mg total) by mouth 2 (two) times daily. 20 tablet 0  . furosemide (LASIX) 40 MG tablet Take 1 tablet (40 mg total) by mouth daily. 30 tablet 0  . gentamicin cream (GARAMYCIN) 0.1 % Apply 1 application topically 3 (three) times daily. 30 g 1  . LORazepam (ATIVAN) 0.5 MG tablet TAKE 1 TABLET BY MOUTH 2 TIMES DAILY AS NEEDED FOR ANXIETY 60 tablet 0  . metoprolol tartrate (LOPRESSOR) 50 MG tablet Take 0.5 tablets (25 mg total) by mouth 2 (two) times daily. 90 tablet 1  . mirtazapine (REMERON) 15 MG tablet TAKE 1 TABLET (15 MG TOTAL) BY MOUTH AT BEDTIME. 30 tablet 3  . Multiple Vitamin (MULTIVITAMIN) capsule Take 1 capsule by mouth daily.      . Multiple Vitamins-Minerals (PRESERVISION AREDS 2) CAPS Take 1 tablet by mouth 2 (two) times daily.    . Rivaroxaban (XARELTO) 15 MG TABS  tablet Take 1 tablet (15 mg total) by mouth daily with supper. 30 tablet 0   No current facility-administered medications for this visit.     No Known Allergies    Review of Systems negative except from HPI and PMH  Physical Exam BP 108/90 (BP Location: Left Arm, Patient Position: Sitting, Cuff Size: Normal)   Pulse 99   Ht 5' 6.5" (1.689 m)   Wt 123 lb (55.8 kg)   BMI 19.56 kg/m  Well developed and well nourished in no acute distress HENT normal E scleral and icterus clear Neck Supple JVP 8;   Clear to ausculation Irregularly irregular rate and rhythm, no murmurs gallops or rub Soft with active bowel sounds No clubbing cyanosis 1+ Edema Alert and oriented, grossly normal motor and sensory function Skin Warm and Dry  Atrial fibrillation at 99 Intervals-/0 8/38  Assessment and  Plan  Atrial  fibrillation-persistent  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  We will continue her diuretics.  We will anticipate checking her electrolytes in about 10 days.  We reviewed TEE guided cardioversion versus i 3 weeks of anticoagulation.  As she is feeling better, we will pursue the latter.  No bleeding issues currently on Xarelto.  We will stop her aspirin   We spent more than 50% of our >25 min visit in face to face counseling regarding the above  Current medicines are reviewed at length with the patient today .  The patient does  have concerns regarding medicines wanting to take daily diltiazem instead of twice daily

## 2017-09-16 NOTE — Therapy (Signed)
[facility] [address] [place], [address] Phone: [phone]   Fax:  [phone]  Physical Therapy Treatment  Patient Details  Name: [name] MRN: [id number] Date of Birth: [birth year] Referring Provider: Caryl Bis MD   Encounter Date: 09/16/2017  PT End of Session - 09/16/17 1350    Visit Number  11    Number of Visits  16    Date for PT Re-Evaluation  09/29/17    PT Start Time  1300    PT Stop Time  1345    PT Time Calculation (min)  45 min    Equipment Utilized During Treatment  Gait belt    Activity Tolerance  Patient tolerated treatment well    Behavior During Therapy  [facility] for tasks assessed/performed       Past Medical History:  Diagnosis Date  . Breast cancer ([place]) 2001   left breast  . Cancer ([place]) 2001   left breast ca  . GERD (gastroesophageal reflux disease)   . Hyperlipidemia   . Hypertension   . Osteoarthritis, multiple sites   . Osteoarthrosis involving, or with mention of more than one site, but not specified as generalized, multiple sites   . Osteopenia   . Personal history of radiation therapy 2001   left breast ca  . Psoriasis     Past Surgical History:  Procedure Laterality Date  . ABDOMINAL HYSTERECTOMY    . ANKLE FRACTURE SURGERY  4/08   left---hardware still in place  . 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 BREAST EXCISIONAL BIOPSY Left yrs ago   benign  . BREAST LUMPECTOMY  2001   left  . CAROTID ENDARTERECTOMY Left 10/23/2004  . FRACTURE SURGERY    . SHOULDER SURGERY  6/07   left  . TONSILLECTOMY AND ADENOIDECTOMY    . TOTAL HIP ARTHROPLASTY  2004   right    Vitals:   09/16/17 1317  BP: 118/66    Subjective Assessment - 09/16/17 1312    Subjective  Pt states she was in hospital over the weekend and that she was prescribed new medications.  Pt states she has seen her MD and that she will be having a cardioversion. Pt reports she feels better  today but tired.    Pertinent History  Patient previously seen for balance difficulties and shoulder pain and the right.  Patient reports no falls in the past six months. Patient reports pain has not improved for the past month and has been about the same since the onset of pain.     Limitations  Walking    Patient Stated Goals  To improve balance when walking     Currently in Pain?  No/denies       TREATMENT:   Therapeutic Exercise:   Nustep step level 1 - x5 min; with use of arms and legs to improve strengthening  Standing on half foam roller with intermittent UE support - x3 min to increase static balance Side stepping over half bosu ball - 2x2 min to improve dynamic balance with frontal plane movements Narrow stance on airex pad without UE support - x2 min to increase static balance Tandem walks - 6x64ft;4x10ft patient demonstrates increased fatigue with exercise  BTB leg press in standing with UE support -- 1x20 to increase LE strength and improve balance with single leg stance Agility ladder side stepping - 6x12 ft   Pt demonstrates increased fatigue with exercise at end  of session.  PT Education - 09/16/17 1349    Education provided  Yes    Education Details  Form/technique with exercise     Person(s) Educated  Patient    Methods  Explanation;Demonstration    Comprehension  Verbalized understanding;Returned demonstration          PT Long Term Goals - 09/01/17 0905      PT LONG TERM GOAL #1   Title  Patient will be independent with HEP to continue benefits of therapy after discharge.     Baseline  Dependent with form and technique for balancing exercises; moderate cueing for balance and strengthening    Time  4    Period  Weeks    Status  On-going      PT LONG TERM GOAL #2   Title  Patient will improve FGA to over 26/30 to indicate functional improvement in strength and balance limitations and decrease fall risk    Baseline  22/30; ; 09/01/2017: 24/30    Time  8     Period  Weeks    Status  On-going      PT LONG TERM GOAL #3   Title  Patient will improve 5xSTS to under 16 seconds without use of a pillow to indicate functional improvement of LE function and decreased fall risk    Baseline  19 sec with pillow under chair; 09/01/2017 12 seconds with use of a pillow     Time  8    Period  Weeks    Status  On-going      PT LONG TERM GOAL #4   Title  Patient will be able to balance for 10sec with SLS to improve static balance and ability to get dressed when in standing    Baseline  1 sec; 09/01/2017: 3 sec each leg    Time  8    Period  Weeks    Status  On-going            Plan - 09/16/17 1352    Clinical Impression Statement  Pt demonstrated improvement with agility ladder lateral stepping with increased speed and greater stability with movement, indicating improved dynamic stability.  Although pt demonstrates improvement, physical therapist had to regress patient's exercises today overall secondary to increased fatigue.  Pt will benefit from further skilled therapy to return to prior level of function.    Rehab Potential  Good    Clinical Impairments Affecting Rehab Potential  Positive: motivation; Negative: , age    PT Frequency  2x / week    PT Duration  6 weeks    PT Treatment/Interventions  ADLs/Self Care Home Management;Aquatic Therapy;Biofeedback;DME Instruction;Gait training;Stair training;Functional mobility training;Therapeutic activities;Therapeutic exercise;Balance training;Neuromuscular re-education;Patient/family education;Passive range of motion;Energy conservation;Ultrasound;Moist Heat;Iontophoresis 4mg /ml Dexamethasone;Cryotherapy;Electrical Stimulation;Manual techniques;Dry needling    PT Next Visit Plan  Progress balance and strengthening exercises,     PT Home Exercise Plan  See education section    Consulted and Agree with Plan of Care  Patient       Patient will benefit from skilled therapeutic intervention in order to  improve the following deficits and impairments:  Pain, Increased fascial restricitons, Decreased coordination, Decreased mobility, Decreased activity tolerance, Decreased endurance, Decreased range of motion, Decreased strength, Hypomobility, Postural dysfunction, Abnormal gait, Decreased balance, Difficulty walking  Visit Diagnosis: Unsteadiness on feet  Muscle weakness (generalized)  History of falling     Problem List Patient Active Problem List   Diagnosis Date Noted  . Atrial fibrillation with RVR ([place])  09/10/2017  . Trapezius strain, right, initial encounter 09/10/2017  . Depression, major, single episode, mild ([place]) 09/10/2017  . A-fib ([place]) 09/10/2017  . Chronic venous insufficiency 09/09/2017  . Lymphedema 09/09/2017  . Pain of toe of left foot 08/17/2017  . Grief 08/17/2017  . Epistaxis 07/31/2017  . Leg swelling 07/31/2017  . Dry eyes 07/06/2017  . Pain and swelling of lower extremity, right 03/29/2017  . Right shoulder pain 03/29/2017  . Diarrhea 05/18/2016  . Fall 05/01/2016  . Unintentional weight loss 12/30/2015  . Leg weakness, bilateral 12/30/2015  . Hyperkalemia 01/01/2015  . Occasional numbness/prickling/tingling of fingers and toes 12/18/2014  . Routine general medical examination at a health care facility 12/13/2013  . Macrocytic anemia 05/19/2013  . Psoriatic arthritis ([place]) 02/03/2013  . Psoriasis   . Osteoarthritis, multiple sites   . Episodic mood disorder ([place]) 12/01/2011  . History of breast cancer 10/28/2011  . GERD (gastroesophageal reflux disease)   . Hyperlipidemia   . Hypertension   . Osteopenia   . Carotid stenosis 05/27/2011    Ricard Dillon, SPT 09/16/2017, 1:57 PM  [facility] [address] [place], [address] Phone: [phone]   Fax:  [phone]  Name: [name] MRN: [id number] Date of Birth: 1940-07-26

## 2017-09-16 NOTE — Patient Instructions (Signed)
Medication Instructions: - Your physician has recommended you make the following change in your medication:  1) STOP diltiazem (short acting) 60 mg 2) START diltiazem (long acting) 120 mg - take 1 capsule by mouth once daily 3) STOP aspirin  Labwork: - Your physician recommends that you return for lab work: March 13 or 14- BMP/ CBC  Procedures/Testing: - Your physician has recommended that you have a Cardioversion (DCCV). Electrical Cardioversion uses a jolt of electricity to your heart either through paddles or wired patches attached to your chest. This is a controlled, usually prescheduled, procedure. Defibrillation is done under light anesthesia in the hospital, and you usually go home the day of the procedure. This is done to get your heart back into a normal rhythm. You are not awake for the procedure. Please see the instruction sheet given to you today.  You are scheduled for a Cardioversion on ________________ with Dr.___________ Please arrive at the [place] [facility] at _________ a.m. on the day of your procedure.  DIET INSTRUCTIONS:  Nothing to eat or drink after midnight except your medications with a              sip of water.         1) Labs: _________________  2) Medications:  YOU MAY TAKE ALL of your remaining medications with a small amount of water.  3) Must have a responsible person to drive you home.  4) Bring a current list of your medications and current insurance cards.    If you have any questions after you get home, please call the office at [phone]   Follow-Up: - Your physician recommends that you schedule a follow-up appointment: March 13 or 14- nurse visit EKG  - Your physician recommends that you schedule a follow-up appointment in: Mid April with Thurmond Butts, [place] or Gerald Stabs, NP.   Any Additional Special Instructions Will Be Listed Below (If Applicable).     If you need a refill on your cardiac medications before your next appointment, please call your  pharmacy.     Electrical Cardioversion Electrical cardioversion is the delivery of a jolt of electricity to restore a normal rhythm to the heart. A rhythm that is too fast or is not regular keeps the heart from pumping well. In this procedure, sticky patches or metal paddles are placed on the chest to deliver electricity to the heart from a device. This procedure may be done in an emergency if:  There is low or no blood pressure as a result of the heart rhythm.  Normal rhythm must be restored as fast as possible to protect the brain and heart from further damage.  It may save a life.  This procedure may also be done for irregular or fast heart rhythms that are not immediately life-threatening. Tell a health care provider about:  Any allergies you have.  All medicines you are taking, including vitamins, herbs, eye drops, creams, and over-the-counter medicines.  Any problems you or family members have had with anesthetic medicines.  Any blood disorders you have.  Any surgeries you have had.  Any medical conditions you have.  Whether you are pregnant or may be pregnant. What are the risks? Generally, this is a safe procedure. However, problems may occur, including:  Allergic reactions to medicines.  A blood clot that breaks free and travels to other parts of your body.  The possible return of an abnormal heart rhythm within hours or days after the procedure.  Your heart  stopping (cardiac arrest). This is rare.  What happens before the procedure? Medicines  Your health care provider may have you start taking: ? Blood-thinning medicines (anticoagulants) so your blood does not clot as easily. ? Medicines may be given to help stabilize your heart rate and rhythm.  Ask your health care provider about changing or stopping your regular medicines. This is especially important if you are taking diabetes medicines or blood thinners. General instructions  Plan to have someone  take you home from the hospital or clinic.  If you will be going home right after the procedure, plan to have someone with you for 24 hours.  Follow instructions from your health care provider about eating or drinking restrictions. What happens during the procedure?  To lower your risk of infection: ? Your health care team will wash or sanitize their hands. ? Your skin will be washed with soap.  An IV tube will be inserted into one of your veins.  You will be given a medicine to help you relax (sedative).  Sticky patches (electrodes) or metal paddles may be placed on your chest.  An electrical shock will be delivered. The procedure may vary among health care providers and hospitals. What happens after the procedure?  Your blood pressure, heart rate, breathing rate, and blood oxygen level will be monitored until the medicines you were given have worn off.  Do not drive for 24 hours if you were given a sedative.  Your heart rhythm will be watched to make sure it does not change. This information is not intended to replace advice given to you by your health care provider. Make sure you discuss any questions you have with your health care provider. Document Released: 06/26/2002 Document Revised: 03/04/2016 Document Reviewed: 01/10/2016 Elsevier Interactive Patient Education  2017 [employer].

## 2017-09-16 NOTE — Progress Notes (Signed)
Patient Care Team: [name], MD as PCP - General (Family Medicine)   HPI  [name] is a 77 y.o. female Seen in follow-up for recent hospitalization having presented with acute shortness of breath and found to be in rapid atrial fibrillation.  Rate control was accomplished with beta-blockers and calcium blockers and anticoagulation with Xarelto.  She has had some persistent peripheral edema.  Chronic shortness of breath and exercise intolerance.  Edema is better following the initiation of diuresis symptoms of shortness of breath improved with heart rate control  She is [name] his wife.  Son, [name], is an unreturned prodigal.  DATE TEST EF   2/19 Echo   65 % LAE (48/2.8/46)         Date Cr K Hgb  2/19 0.8 3.9 12.5         Thromboembolic risk factors ( age  -2, HTN-1, Vasc disease -1, Gender-1) for a CHADSVASc Score of 5  Records and Results Reviewed hosp records  Past Medical History:  Diagnosis Date  . Breast cancer ([place]) 2001   left breast  . Cancer ([place]) 2001   left breast ca  . GERD (gastroesophageal reflux disease)   . Hyperlipidemia   . Hypertension   . Osteoarthritis, multiple sites   . Osteoarthrosis involving, or with mention of more than one site, but not specified as generalized, multiple sites   . Osteopenia   . Personal history of radiation therapy 2001   left breast ca  . Psoriasis     Past Surgical History:  Procedure Laterality Date  . ABDOMINAL HYSTERECTOMY    . ANKLE FRACTURE SURGERY  4/08   left---hardware still in place  . 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 BREAST EXCISIONAL BIOPSY Left yrs ago   benign  . BREAST LUMPECTOMY  2001   left  . CAROTID ENDARTERECTOMY Left 10/23/2004  . FRACTURE SURGERY    . SHOULDER SURGERY  6/07   left  . TONSILLECTOMY AND ADENOIDECTOMY    . TOTAL HIP ARTHROPLASTY  2004   right    Current Outpatient Medications  Medication Sig Dispense Refill  . aspirin 81 MG tablet  Take 81 mg by mouth daily.    . Cholecalciferol (VITAMIN D3) 1000 units CAPS Take 1 capsule by mouth 2 (two) times daily.     Marland Kitchen diltiazem (CARDIZEM SR) 60 MG 12 hr capsule Take 1 capsule (60 mg total) by mouth every 12 (twelve) hours. 60 capsule 0  . doxycycline (VIBRA-TABS) 100 MG tablet Take 1 tablet (100 mg total) by mouth 2 (two) times daily. 20 tablet 0  . furosemide (LASIX) 40 MG tablet Take 1 tablet (40 mg total) by mouth daily. 30 tablet 0  . gentamicin cream (GARAMYCIN) 0.1 % Apply 1 application topically 3 (three) times daily. 30 g 1  . LORazepam (ATIVAN) 0.5 MG tablet TAKE 1 TABLET BY MOUTH 2 TIMES DAILY AS NEEDED FOR ANXIETY 60 tablet 0  . metoprolol tartrate (LOPRESSOR) 50 MG tablet Take 0.5 tablets (25 mg total) by mouth 2 (two) times daily. 90 tablet 1  . mirtazapine (REMERON) 15 MG tablet TAKE 1 TABLET (15 MG TOTAL) BY MOUTH AT BEDTIME. 30 tablet 3  . Multiple Vitamin (MULTIVITAMIN) capsule Take 1 capsule by mouth daily.      . Multiple Vitamins-Minerals (PRESERVISION AREDS 2) CAPS Take 1 tablet by mouth 2 (two) times daily.    . Rivaroxaban (XARELTO) 15 MG TABS  tablet Take 1 tablet (15 mg total) by mouth daily with supper. 30 tablet 0   No current facility-administered medications for this visit.     No Known Allergies    Review of Systems negative except from HPI and PMH  Physical Exam BP 108/90 (BP Location: Left Arm, Patient Position: Sitting, Cuff Size: Normal)   Pulse 99   Ht 5' 6.5" (1.689 m)   Wt 123 lb (55.8 kg)   BMI 19.56 kg/m  Well developed and well nourished in no acute distress HENT normal E scleral and icterus clear Neck Supple JVP 8;   Clear to ausculation Irregularly irregular rate and rhythm, no murmurs gallops or rub Soft with active bowel sounds No clubbing cyanosis 1+ Edema Alert and oriented, grossly normal motor and sensory function Skin Warm and Dry  Atrial fibrillation at 99 Intervals-/0 8/38  Assessment and  Plan  Atrial  fibrillation-persistent  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  We will continue her diuretics.  We will anticipate checking her electrolytes in about 10 days.  We reviewed TEE guided cardioversion versus i 3 weeks of anticoagulation.  As she is feeling better, we will pursue the latter.  No bleeding issues currently on Xarelto.  We will stop her aspirin   We spent more than 50% of our >25 min visit in face to face counseling regarding the above  Current medicines are reviewed at length with the patient today .  The patient does  have concerns regarding medicines wanting to take daily diltiazem instead of twice daily

## 2017-09-20 ENCOUNTER — Telehealth: Payer: Self-pay | Admitting: Internal Medicine

## 2017-09-20 ENCOUNTER — Telehealth: Payer: Self-pay

## 2017-09-20 ENCOUNTER — Encounter: Payer: Self-pay | Admitting: *Deleted

## 2017-09-20 NOTE — Telephone Encounter (Signed)
Patient was to have a cardioversion scheduled and has not heard anything Please call to discuss

## 2017-09-20 NOTE — Telephone Encounter (Signed)
Appears seeing pcp on 3/13

## 2017-09-20 NOTE — Telephone Encounter (Signed)
Spoke with Ms. Charette about making a new patient appointment in follow up to referral received. She states that she is very appreciative of the call but she recently lost her husband and has a lot on her plate right now. She does not wish to make an appointment at this time but will contact the office if she changes her mind.

## 2017-09-20 NOTE — Telephone Encounter (Signed)
-----   Message from Alisa Graff, [place] sent at 09/13/2017  8:26 AM EST ----- Regarding: Please call to schedule appt Referral made during hospitalization

## 2017-09-21 ENCOUNTER — Ambulatory Visit: Payer: Medicare Other | Attending: Family Medicine

## 2017-09-21 DIAGNOSIS — Z9181 History of falling: Secondary | ICD-10-CM | POA: Insufficient documentation

## 2017-09-21 DIAGNOSIS — M6281 Muscle weakness (generalized): Secondary | ICD-10-CM | POA: Diagnosis not present

## 2017-09-21 DIAGNOSIS — R2681 Unsteadiness on feet: Secondary | ICD-10-CM | POA: Diagnosis not present

## 2017-09-21 NOTE — Telephone Encounter (Signed)
I called and spoke with the patient. I advised her that I have scheduled her DCCV on Monday 10/04/17 with Dr. Fletcher Anon. This is scheduled for 7:30am. Instructions reviewed with the patient. She is aware not to miss any doses on her blood thinner. She is scheduled for lab work (BMP/CBC) on 3/13 with a repeat EKG to confirm her rhythm.  The patient is agreeable and voices understanding.

## 2017-09-21 NOTE — Therapy (Signed)
[facility] [address] [place], [address] Phone: [phone]   Fax:  [phone]  Physical Therapy Treatment  Patient Details  Name: [name] MRN: [id number] Date of Birth: [birth year] Referring Provider: Caryl Bis MD   Encounter Date: 09/21/2017  PT End of Session - 09/21/17 0923    Visit Number  12    Number of Visits  16    Date for PT Re-Evaluation  09/29/17    PT Start Time  0906    PT Stop Time  0945    PT Time Calculation (min)  39 min    Equipment Utilized During Treatment  Gait belt    Activity Tolerance  Patient tolerated treatment well    Behavior During Therapy  [facility] for tasks assessed/performed       Past Medical History:  Diagnosis Date  . Breast cancer ([place]) 2001   left breast  . Cancer ([place]) 2001   left breast ca  . GERD (gastroesophageal reflux disease)   . Hyperlipidemia   . Hypertension   . Osteoarthritis, multiple sites   . Osteoarthrosis involving, or with mention of more than one site, but not specified as generalized, multiple sites   . Osteopenia   . Personal history of radiation therapy 2001   left breast ca  . Psoriasis     Past Surgical History:  Procedure Laterality Date  . ABDOMINAL HYSTERECTOMY    . ANKLE FRACTURE SURGERY  4/08   left---hardware still in place  . 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 BREAST EXCISIONAL BIOPSY Left yrs ago   benign  . BREAST LUMPECTOMY  2001   left  . CAROTID ENDARTERECTOMY Left 10/23/2004  . FRACTURE SURGERY    . SHOULDER SURGERY  6/07   left  . TONSILLECTOMY AND ADENOIDECTOMY    . TOTAL HIP ARTHROPLASTY  2004   right    There were no vitals filed for this visit.  Subjective Assessment - 09/21/17 0917    Subjective  Patient states no major changes since the previous visist. Patient reports she's tried calling the medical office to have her cardioversion 3 times in the past 3 days and have not heard back from  them.     Pertinent History  Patient previously seen for balance difficulties and shoulder pain and the right.  Patient reports no falls in the past six months. Patient reports pain has not improved for the past month and has been about the same since the onset of pain.     Limitations  Walking    Patient Stated Goals  To improve balance when walking     Currently in Pain?  No/denies         TREATMENT:   Therapeutic Exercise:   Nustep step level 4 - x5 min; with use of arms and legs to improve strengthening  Standing on half foam roller with intermittent UE support - x3 min to increase static balance Side stepping along balance stones for balance - x3 min with intermittent UE support Airex tandem stance balance - 2 x 56min  Tandem stance weight shifts forward/backward - x 20 B Monster walks with RTB - 4 x 60ft  TRX squats with UE support - 2 x 10 Serpentine pattern with balance stones - x10 down and back     Pt demonstrates increased fatigue with exercise at end of session.      PT Education - 09/21/17 0354  Education provided  Yes    Education Details  form/technique with exercise    Person(s) Educated  Patient    Methods  Explanation;Demonstration    Comprehension  Verbalized understanding;Returned demonstration          PT Long Term Goals - 09/01/17 0905      PT LONG TERM GOAL #1   Title  Patient will be independent with HEP to continue benefits of therapy after discharge.     Baseline  Dependent with form and technique for balancing exercises; moderate cueing for balance and strengthening    Time  4    Period  Weeks    Status  On-going      PT LONG TERM GOAL #2   Title  Patient will improve FGA to over 26/30 to indicate functional improvement in strength and balance limitations and decrease fall risk    Baseline  22/30; ; 09/01/2017: 24/30    Time  8    Period  Weeks    Status  On-going      PT LONG TERM GOAL #3   Title  Patient will improve 5xSTS to under  16 seconds without use of a pillow to indicate functional improvement of LE function and decreased fall risk    Baseline  19 sec with pillow under chair; 09/01/2017 12 seconds with use of a pillow     Time  8    Period  Weeks    Status  On-going      PT LONG TERM GOAL #4   Title  Patient will be able to balance for 10sec with SLS to improve static balance and ability to get dressed when in standing    Baseline  1 sec; 09/01/2017: 3 sec each leg    Time  8    Period  Weeks    Status  On-going            Plan - 09/21/17 0942    Clinical Impression Statement  Patient demonstrates improvement with performance of balance activities in tandem stance requiring less UE support to perform exercises. Although patient is demonstrating improvement, she continues to have increased balance difficulties with performing balancing activities with ambulation and patient will benefit from further skilled therapy to return to prior level of function.     Rehab Potential  Good    Clinical Impairments Affecting Rehab Potential  Positive: motivation; Negative: , age    PT Frequency  2x / week    PT Duration  6 weeks    PT Treatment/Interventions  ADLs/Self Care Home Management;Aquatic Therapy;Biofeedback;DME Instruction;Gait training;Stair training;Functional mobility training;Therapeutic activities;Therapeutic exercise;Balance training;Neuromuscular re-education;Patient/family education;Passive range of motion;Energy conservation;Ultrasound;Moist Heat;Iontophoresis 4mg /ml Dexamethasone;Cryotherapy;Electrical Stimulation;Manual techniques;Dry needling    PT Next Visit Plan  Progress balance and strengthening exercises,     PT Home Exercise Plan  See education section    Consulted and Agree with Plan of Care  Patient       Patient will benefit from skilled therapeutic intervention in order to improve the following deficits and impairments:  Pain, Increased fascial restricitons, Decreased coordination,  Decreased mobility, Decreased activity tolerance, Decreased endurance, Decreased range of motion, Decreased strength, Hypomobility, Postural dysfunction, Abnormal gait, Decreased balance, Difficulty walking  Visit Diagnosis: Unsteadiness on feet  Muscle weakness (generalized)     Problem List Patient Active Problem List   Diagnosis Date Noted  . Atrial fibrillation with RVR ([place]) 09/10/2017  . Trapezius strain, right, initial encounter 09/10/2017  . Depression, major, single episode, mild ([place])  09/10/2017  . A-fib ([place]) 09/10/2017  . Chronic venous insufficiency 09/09/2017  . Lymphedema 09/09/2017  . Pain of toe of left foot 08/17/2017  . Grief 08/17/2017  . Epistaxis 07/31/2017  . Leg swelling 07/31/2017  . Dry eyes 07/06/2017  . Pain and swelling of lower extremity, right 03/29/2017  . Right shoulder pain 03/29/2017  . Diarrhea 05/18/2016  . Fall 05/01/2016  . Unintentional weight loss 12/30/2015  . Leg weakness, bilateral 12/30/2015  . Hyperkalemia 01/01/2015  . Occasional numbness/prickling/tingling of fingers and toes 12/18/2014  . Routine general medical examination at a health care facility 12/13/2013  . Macrocytic anemia 05/19/2013  . Psoriatic arthritis ([place]) 02/03/2013  . Psoriasis   . Osteoarthritis, multiple sites   . Episodic mood disorder ([place]) 12/01/2011  . History of breast cancer 10/28/2011  . GERD (gastroesophageal reflux disease)   . Hyperlipidemia   . Hypertension   . Osteopenia   . Carotid stenosis 05/27/2011    Blythe Stanford, PT DPT 09/21/2017, 9:45 AM  [facility] [address] [place], [address] Phone: [phone]   Fax:  [phone]  Name: OPHELIA SIPE MRN: [id number] Date of Birth: [birth year]

## 2017-09-22 ENCOUNTER — Other Ambulatory Visit: Payer: Self-pay | Admitting: Internal Medicine

## 2017-09-22 ENCOUNTER — Ambulatory Visit: Payer: Medicare Other

## 2017-09-22 DIAGNOSIS — I4819 Other persistent atrial fibrillation: Secondary | ICD-10-CM

## 2017-09-22 DIAGNOSIS — R2681 Unsteadiness on feet: Secondary | ICD-10-CM

## 2017-09-22 DIAGNOSIS — Z9181 History of falling: Secondary | ICD-10-CM

## 2017-09-22 DIAGNOSIS — M6281 Muscle weakness (generalized): Secondary | ICD-10-CM

## 2017-09-22 NOTE — Telephone Encounter (Signed)
We will plan on seeing her at hospital follow-up.

## 2017-09-22 NOTE — Therapy (Signed)
[facility] [address], [place], [address] Phone: [phone]   Fax:  [phone]  Physical Therapy Treatment  Patient Details  Name: [name] MRN: [id number] Date of Birth: 10/25/1940 Referring Provider: Caryl Bis MD   Encounter Date: 09/22/2017  PT End of Session - 09/22/17 0934    Visit Number  13    Number of Visits  16    Date for PT Re-Evaluation  09/29/17    PT Start Time  0920    PT Stop Time  1000    PT Time Calculation (min)  40 min    Equipment Utilized During Treatment  Gait belt    Activity Tolerance  Patient tolerated treatment well    Behavior During Therapy  [facility] for tasks assessed/performed       Past Medical History:  Diagnosis Date  . Breast cancer ([place]) 2001   left breast  . Cancer ([place]) 2001   left breast ca  . GERD (gastroesophageal reflux disease)   . Hyperlipidemia   . Hypertension   . Osteoarthritis, multiple sites   . Osteoarthrosis involving, or with mention of more than one site, but not specified as generalized, multiple sites   . Osteopenia   . Personal history of radiation therapy 2001   left breast ca  . Psoriasis     Past Surgical History:  Procedure Laterality Date  . ABDOMINAL HYSTERECTOMY    . ANKLE FRACTURE SURGERY  4/08   left---hardware still in place  . 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 BREAST EXCISIONAL BIOPSY Left yrs ago   benign  . BREAST LUMPECTOMY  2001   left  . CAROTID ENDARTERECTOMY Left 10/23/2004  . FRACTURE SURGERY    . SHOULDER SURGERY  6/07   left  . TONSILLECTOMY AND ADENOIDECTOMY    . TOTAL HIP ARTHROPLASTY  2004   right    There were no vitals filed for this visit.  Subjective Assessment - 09/22/17 0929    Subjective  Patient reports increased fatigue today which she reports "I don't know if it's my heart or grieving". 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     Pertinent History  Patient  previously seen for balance difficulties and shoulder pain and the right.  Patient reports no falls in the past six months. Patient reports pain has not improved for the past month and has been about the same since the onset of pain.     Limitations  Walking    Patient Stated Goals  To improve balance when walking     Currently in Pain?  No/denies        TREATMENT:   Therapeutic Exercise:   Nustep step level 5 - x5 min; with use of arms and legs to improve strengthening  TRX squats with UE support - 2 x 10 Heel to toe weight shifts on large dynadisc - x 31min Feet together balance on the wobbleboard with intermittent UE support - 2 x 78min forward and laterally Stepping in 4 square with RTB around knees - 1.5 min x 2  Backwards walking - 4 x 10 ft B with holding 10# each hand Tandem ambulation - 6 x 4ft with intermittent therapist   Pt demonstrates increased fatigue with exercise at end of session.    PT Education - 09/22/17 0933    Education provided  Yes    Education Details  form/technique with exercise  Person(s) Educated  Patient    Methods  Explanation;Demonstration    Comprehension  Verbalized understanding;Returned demonstration          PT Long Term Goals - 09/01/17 0905      PT LONG TERM GOAL #1   Title  Patient will be independent with HEP to continue benefits of therapy after discharge.     Baseline  Dependent with form and technique for balancing exercises; moderate cueing for balance and strengthening    Time  4    Period  Weeks    Status  On-going      PT LONG TERM GOAL #2   Title  Patient will improve FGA to over 26/30 to indicate functional improvement in strength and balance limitations and decrease fall risk    Baseline  22/30; ; 09/01/2017: 24/30    Time  8    Period  Weeks    Status  On-going      PT LONG TERM GOAL #3   Title  Patient will improve 5xSTS to under 16 seconds without use of a pillow to indicate functional improvement of LE  function and decreased fall risk    Baseline  19 sec with pillow under chair; 09/01/2017 12 seconds with use of a pillow     Time  8    Period  Weeks    Status  On-going      PT LONG TERM GOAL #4   Title  Patient will be able to balance for 10sec with SLS to improve static balance and ability to get dressed when in standing    Baseline  1 sec; 09/01/2017: 3 sec each leg    Time  8    Period  Weeks    Status  On-going            Plan - 09/22/17 0944    Clinical Impression Statement  Patient demonstrates less postural sway with performance of static balance activities indicating functional carryover between visitation sessions. Patient continues to require increased widened BOS and decreased speed with performance of exercises walking backwards indicating poor balance and stability. Paitent will benefit from further skilled therapy to return to prior level of function.     Rehab Potential  Good    Clinical Impairments Affecting Rehab Potential  Positive: motivation; Negative: , age    PT Frequency  2x / week    PT Duration  6 weeks    PT Treatment/Interventions  ADLs/Self Care Home Management;Aquatic Therapy;Biofeedback;DME Instruction;Gait training;Stair training;Functional mobility training;Therapeutic activities;Therapeutic exercise;Balance training;Neuromuscular re-education;Patient/family education;Passive range of motion;Energy conservation;Ultrasound;Moist Heat;Iontophoresis 4mg /ml Dexamethasone;Cryotherapy;Electrical Stimulation;Manual techniques;Dry needling    PT Next Visit Plan  Progress balance and strengthening exercises,     PT Home Exercise Plan  See education section    Consulted and Agree with Plan of Care  Patient       Patient will benefit from skilled therapeutic intervention in order to improve the following deficits and impairments:  Pain, Increased fascial restricitons, Decreased coordination, Decreased mobility, Decreased activity tolerance, Decreased endurance,  Decreased range of motion, Decreased strength, Hypomobility, Postural dysfunction, Abnormal gait, Decreased balance, Difficulty walking  Visit Diagnosis: Unsteadiness on feet  Muscle weakness (generalized)  History of falling     Problem List Patient Active Problem List   Diagnosis Date Noted  . Atrial fibrillation with RVR ([place]) 09/10/2017  . Trapezius strain, right, initial encounter 09/10/2017  . Depression, major, single episode, mild ([place]) 09/10/2017  . A-fib ([place]) 09/10/2017  . Chronic venous insufficiency  09/09/2017  . Lymphedema 09/09/2017  . Pain of toe of left foot 08/17/2017  . Grief 08/17/2017  . Epistaxis 07/31/2017  . Leg swelling 07/31/2017  . Dry eyes 07/06/2017  . Pain and swelling of lower extremity, right 03/29/2017  . Right shoulder pain 03/29/2017  . Diarrhea 05/18/2016  . Fall 05/01/2016  . Unintentional weight loss 12/30/2015  . Leg weakness, bilateral 12/30/2015  . Hyperkalemia 01/01/2015  . Occasional numbness/prickling/tingling of fingers and toes 12/18/2014  . Routine general medical examination at a health care facility 12/13/2013  . Macrocytic anemia 05/19/2013  . Psoriatic arthritis ([place]) 02/03/2013  . Psoriasis   . Osteoarthritis, multiple sites   . Episodic mood disorder ([place]) 12/01/2011  . History of breast cancer 10/28/2011  . GERD (gastroesophageal reflux disease)   . Hyperlipidemia   . Hypertension   . Osteopenia   . Carotid stenosis 05/27/2011    Blythe Stanford, PT DPT 09/22/2017, 10:03 AM  [facility] [address], [place], [address] Phone: [phone]   Fax:  [phone]  Name: [name] MRN: [id number] Date of Birth: Sep 15, 1940

## 2017-09-27 ENCOUNTER — Ambulatory Visit: Payer: Medicare Other

## 2017-09-27 DIAGNOSIS — M6281 Muscle weakness (generalized): Secondary | ICD-10-CM

## 2017-09-27 DIAGNOSIS — R2681 Unsteadiness on feet: Secondary | ICD-10-CM | POA: Diagnosis not present

## 2017-09-27 DIAGNOSIS — Z9181 History of falling: Secondary | ICD-10-CM

## 2017-09-27 NOTE — Therapy (Signed)
[facility] [address] [place], [address] Phone: [phone]   Fax:  [phone]  Physical Therapy Treatment  Patient Details  Name: [name] MRN: [id number] Date of Birth: 1940-10-26 Referring Provider: Caryl Bis MD   Encounter Date: 09/27/2017  PT End of Session - 09/27/17 0932    Visit Number  14    Number of Visits  16    Date for PT Re-Evaluation  09/29/17    PT Start Time  0905    PT Stop Time  0945    PT Time Calculation (min)  40 min    Equipment Utilized During Treatment  Gait belt    Activity Tolerance  Patient tolerated treatment well    Behavior During Therapy  [facility] for tasks assessed/performed       Past Medical History:  Diagnosis Date  . Breast cancer ([place]) 2001   left breast  . Cancer ([place]) 2001   left breast ca  . GERD (gastroesophageal reflux disease)   . Hyperlipidemia   . Hypertension   . Osteoarthritis, multiple sites   . Osteoarthrosis involving, or with mention of more than one site, but not specified as generalized, multiple sites   . Osteopenia   . Personal history of radiation therapy 2001   left breast ca  . Psoriasis     Past Surgical History:  Procedure Laterality Date  . ABDOMINAL HYSTERECTOMY    . ANKLE FRACTURE SURGERY  4/08   left---hardware still in place  . 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 BREAST EXCISIONAL BIOPSY Left yrs ago   benign  . BREAST LUMPECTOMY  2001   left  . CAROTID ENDARTERECTOMY Left 10/23/2004  . FRACTURE SURGERY    . SHOULDER SURGERY  6/07   left  . TONSILLECTOMY AND ADENOIDECTOMY    . TOTAL HIP ARTHROPLASTY  2004   right    There were no vitals filed for this visit.  Subjective Assessment - 09/27/17 0923    Subjective  Patient reports she's been feeling 'in blurr' the past few months. Patient reports she's her average blood pressure reading is around 149/76mmHg. Patient reports she has been writing down her BP  readings.     Pertinent History  Patient previously seen for balance difficulties and shoulder pain and the right.  Patient reports no falls in the past six months. Patient reports pain has not improved for the past month and has been about the same since the onset of pain.     Limitations  Walking    Patient Stated Goals  To improve balance when walking     Currently in Pain?  No/denies       TREATMENT:   Therapeutic Exercise:   Nustep step level 5 - x5 min; with use of arms and legs to improve strengthening  TRX squats with UE support - 2 x 10 Standing on half foam balance - x3 min Heel to toe weight shifts on large dynadisc - x 84min; balancing for x54min Hip abduction in standing - 2 x 10 Hip extension in standing - 2 x 10   Pt demonstrates increased fatigue with exercise at end of session.    PT Education - 09/27/17 0930    Education provided  Yes    Education Details  form/technique with exercise    Person(s) Educated  Patient    Methods  Explanation;Demonstration    Comprehension  Verbalized understanding;Returned demonstration  PT Long Term Goals - 09/01/17 0905      PT LONG TERM GOAL #1   Title  Patient will be independent with HEP to continue benefits of therapy after discharge.     Baseline  Dependent with form and technique for balancing exercises; moderate cueing for balance and strengthening    Time  4    Period  Weeks    Status  On-going      PT LONG TERM GOAL #2   Title  Patient will improve FGA to over 26/30 to indicate functional improvement in strength and balance limitations and decrease fall risk    Baseline  22/30; ; 09/01/2017: 24/30    Time  8    Period  Weeks    Status  On-going      PT LONG TERM GOAL #3   Title  Patient will improve 5xSTS to under 16 seconds without use of a pillow to indicate functional improvement of LE function and decreased fall risk    Baseline  19 sec with pillow under chair; 09/01/2017 12 seconds with use of a  pillow     Time  8    Period  Weeks    Status  On-going      PT LONG TERM GOAL #4   Title  Patient will be able to balance for 10sec with SLS to improve static balance and ability to get dressed when in standing    Baseline  1 sec; 09/01/2017: 3 sec each leg    Time  8    Period  Weeks    Status  On-going            Plan - 09/27/17 0937    Clinical Impression Statement  Took large rest breaks after a BP reading of 120/77mmHg during session and monitored symptoms throughout treatment. Did not advance/progress therapeutic exercises and focused on  performing balancing activities to not raise blood pressure. Patient requires intermittent UE support with exercise to maintain balance indicating poor dynamic and static limitations. Patient will benefit from further skilled therapy to return to prior level of function.     Rehab Potential  Good    Clinical Impairments Affecting Rehab Potential  Positive: motivation; Negative: , age    PT Frequency  2x / week    PT Duration  6 weeks    PT Treatment/Interventions  ADLs/Self Care Home Management;Aquatic Therapy;Biofeedback;DME Instruction;Gait training;Stair training;Functional mobility training;Therapeutic activities;Therapeutic exercise;Balance training;Neuromuscular re-education;Patient/family education;Passive range of motion;Energy conservation;Ultrasound;Moist Heat;Iontophoresis 4mg /ml Dexamethasone;Cryotherapy;Electrical Stimulation;Manual techniques;Dry needling    PT Next Visit Plan  Progress balance and strengthening exercises,     PT Home Exercise Plan  See education section    Consulted and Agree with Plan of Care  Patient       Patient will benefit from skilled therapeutic intervention in order to improve the following deficits and impairments:  Pain, Increased fascial restricitons, Decreased coordination, Decreased mobility, Decreased activity tolerance, Decreased endurance, Decreased range of motion, Decreased strength,  Hypomobility, Postural dysfunction, Abnormal gait, Decreased balance, Difficulty walking  Visit Diagnosis: Unsteadiness on feet  Muscle weakness (generalized)  History of falling     Problem List Patient Active Problem List   Diagnosis Date Noted  . Atrial fibrillation with RVR ([place]) 09/10/2017  . Trapezius strain, right, initial encounter 09/10/2017  . Depression, major, single episode, mild ([place]) 09/10/2017  . A-fib ([place]) 09/10/2017  . Chronic venous insufficiency 09/09/2017  . Lymphedema 09/09/2017  . Pain of toe of left foot 08/17/2017  .  Grief 08/17/2017  . Epistaxis 07/31/2017  . Leg swelling 07/31/2017  . Dry eyes 07/06/2017  . Pain and swelling of lower extremity, right 03/29/2017  . Right shoulder pain 03/29/2017  . Diarrhea 05/18/2016  . Fall 05/01/2016  . Unintentional weight loss 12/30/2015  . Leg weakness, bilateral 12/30/2015  . Hyperkalemia 01/01/2015  . Occasional numbness/prickling/tingling of fingers and toes 12/18/2014  . Routine general medical examination at a health care facility 12/13/2013  . Macrocytic anemia 05/19/2013  . Psoriatic arthritis ([place]) 02/03/2013  . Psoriasis   . Osteoarthritis, multiple sites   . Episodic mood disorder ([place]) 12/01/2011  . History of breast cancer 10/28/2011  . GERD (gastroesophageal reflux disease)   . Hyperlipidemia   . Hypertension   . Osteopenia   . Carotid stenosis 05/27/2011    Blythe Stanford, PT DPT 09/27/2017, 9:47 AM  [facility] [address], [place], [address] Phone: [phone]   Fax:  [phone]  Name: [name] MRN: [id number] Date of Birth: 02-21-1941

## 2017-09-28 ENCOUNTER — Encounter: Payer: Self-pay | Admitting: Podiatry

## 2017-09-28 ENCOUNTER — Ambulatory Visit (INDEPENDENT_AMBULATORY_CARE_PROVIDER_SITE_OTHER): Payer: Medicare Other | Admitting: Podiatry

## 2017-09-28 DIAGNOSIS — I83025 Varicose veins of left lower extremity with ulcer other part of foot: Secondary | ICD-10-CM

## 2017-09-28 DIAGNOSIS — L97522 Non-pressure chronic ulcer of other part of left foot with fat layer exposed: Secondary | ICD-10-CM | POA: Diagnosis not present

## 2017-09-28 DIAGNOSIS — L97529 Non-pressure chronic ulcer of other part of left foot with unspecified severity: Secondary | ICD-10-CM

## 2017-09-29 ENCOUNTER — Ambulatory Visit (INDEPENDENT_AMBULATORY_CARE_PROVIDER_SITE_OTHER): Payer: Medicare Other | Admitting: Family Medicine

## 2017-09-29 ENCOUNTER — Other Ambulatory Visit: Payer: Medicare Other

## 2017-09-29 ENCOUNTER — Other Ambulatory Visit: Payer: Self-pay

## 2017-09-29 ENCOUNTER — Ambulatory Visit (INDEPENDENT_AMBULATORY_CARE_PROVIDER_SITE_OTHER): Payer: Medicare Other | Admitting: *Deleted

## 2017-09-29 ENCOUNTER — Encounter: Payer: Self-pay | Admitting: Family Medicine

## 2017-09-29 ENCOUNTER — Other Ambulatory Visit (INDEPENDENT_AMBULATORY_CARE_PROVIDER_SITE_OTHER): Payer: Medicare Other

## 2017-09-29 VITALS — BP 118/80 | HR 84 | Temp 97.8°F | Wt 122.2 lb

## 2017-09-29 VITALS — BP 110/80 | HR 102 | Ht 66.5 in | Wt 122.0 lb

## 2017-09-29 DIAGNOSIS — I4891 Unspecified atrial fibrillation: Secondary | ICD-10-CM

## 2017-09-29 DIAGNOSIS — E876 Hypokalemia: Secondary | ICD-10-CM | POA: Diagnosis not present

## 2017-09-29 DIAGNOSIS — M7989 Other specified soft tissue disorders: Secondary | ICD-10-CM | POA: Diagnosis not present

## 2017-09-29 DIAGNOSIS — M79675 Pain in left toe(s): Secondary | ICD-10-CM

## 2017-09-29 LAB — COMPREHENSIVE METABOLIC PANEL
ALT: 12 U/L (ref 0–35)
AST: 16 U/L (ref 0–37)
Albumin: 4.1 g/dL (ref 3.5–5.2)
Alkaline Phosphatase: 86 U/L (ref 39–117)
BUN: 37 mg/dL — ABNORMAL HIGH (ref 6–23)
CO2: 33 mEq/L — ABNORMAL HIGH (ref 19–32)
Calcium: 10.5 mg/dL (ref 8.4–10.5)
Chloride: 98 mEq/L (ref 96–112)
Creatinine, Ser: 0.96 mg/dL (ref 0.40–1.20)
GFR: 59.66 mL/min — ABNORMAL LOW (ref >60.00)
Glucose, Bld: 92 mg/dL (ref 70–99)
Potassium: 4.4 mEq/L (ref 3.5–5.1)
Sodium: 140 mEq/L (ref 135–145)
Total Bilirubin: 0.6 mg/dL (ref 0.2–1.2)
Total Protein: 7 g/dL (ref 6.0–8.3)

## 2017-09-29 LAB — CBC WITH DIFFERENTIAL/PLATELET
Basophils Absolute: 0.1 10*3/uL (ref 0.0–0.1)
Basophils Relative: 1.1 % (ref 0.0–3.0)
Eosinophils Absolute: 0 10*3/uL (ref 0.0–0.7)
Eosinophils Relative: 0.5 % (ref 0.0–5.0)
HCT: 36.9 % (ref 36.0–46.0)
Hemoglobin: 12.2 g/dL (ref 12.0–15.0)
Lymphocytes Relative: 25.8 % (ref 12.0–46.0)
Lymphs Abs: 2 10*3/uL (ref 0.7–4.0)
MCHC: 33.2 g/dL (ref 30.0–36.0)
MCV: 104.8 fl — ABNORMAL HIGH (ref 78.0–100.0)
Monocytes Absolute: 0.9 10*3/uL (ref 0.1–1.0)
Monocytes Relative: 11.1 % (ref 3.0–12.0)
Neutro Abs: 4.7 10*3/uL (ref 1.4–7.7)
Neutrophils Relative %: 61.5 % (ref 43.0–77.0)
Platelets: 279 10*3/uL (ref 150.0–400.0)
RBC: 3.52 Mil/uL — ABNORMAL LOW (ref 3.87–5.11)
RDW: 13.2 % (ref 11.5–15.5)
WBC: 7.7 10*3/uL (ref 4.0–10.5)

## 2017-09-29 NOTE — Patient Instructions (Addendum)
Medication Instructions: - Your physician recommends that you continue on your current medications as directed. Please refer to the Current Medication list given to you today.  Labwork: - please take the orders for your labs to Dr. Ellen Henri office today in case they want any additional labs done on you  Procedures/Testing: - Cardioversion as scheduled on Monday 10/04/17 at [facility] are scheduled for a Cardioversion on ___Monday 3/18/19_____________ with Dr._Arida_________ Please arrive at the [facility] at __6:30 am____ on the day of your procedure.  DIET INSTRUCTIONS:  Nothing to eat or drink after midnight except your medications with a              sip of water.  1) Labs: ___3/13/19_______________  2) Medications:  You may take all of your regular medications with enough water to get them down safely the morning of your procedure unless listed below:  - HOLD lasix (furosemide) the morning of your procedure  3) Must have a responsible person to drive you home.  4) Bring a current list of your medications and current insurance cards.    If you have any questions after you get home, please call the office at [phone]  Follow-Up: - as scheduled 10/28/17 with Ignacia Bayley, NP  Any Additional Special Instructions Will Be Listed Below (If Applicable).     If you need a refill on your cardiac medications before your next appointment, please call your pharmacy.

## 2017-09-29 NOTE — Assessment & Plan Note (Signed)
Suspect related to venous insufficiency though she was diagnosed with diastolic CHF though her echo was not technically sufficient to allow diastolic evaluation.  Swelling has improved with Lasix.  Discussed monitoring her swelling.

## 2017-09-29 NOTE — Assessment & Plan Note (Signed)
Needs recheck.  It appears this was already drawn through cardiology.  It was unclear at first whether or not these things were collected given that the patient came with order forms and it sounded as though they wanted [place] to draw these though they appear that they were already drawn in the system though it is somewhat unclear.  We will have the lab contact them to determine if they had been drawn.

## 2017-09-29 NOTE — Progress Notes (Addendum)
1.) Reason for visit: EKG  2.) Name of MD requesting visit: Caryl Comes  3.) H&P: Patient seen on 09/16/17 with Dr. Caryl Comes for post hospital follow up of atrial fib w/ RVR. She is currently scheduled for a cardioversion on 10/04/17 with Dr. Fletcher Anon at [facility].  4.) ROS related to problem: Patient is currently without complaints today. She is mildly SOB as she had to walk from the parking lot due to the [place] parking attendant being "backed up."  She does bring in a list of BP/ HR readings from home:  09/17/17- 117/89 (89) 09/18/17- 108/78 (111) 09/20/17- 129/94 (100) 09/23/17- 146/94 (127) "morning rush" 09/23/17- 119/84 (65) 09/24/17- 129/101 (no HR reported) "mid afternoon" 09/26/17- 106/70 (95) 09/26/17- 117/83 (124)   5.) Assessment and plan per MD: EKG reviewed with Dr. Saunders Revel (DOD)- confirms the patient is still in a-fib. Will proceed with DCCV as scheduled on Monday 10/04/17. We were due to draw a BMP/ CBC on her today, but she has an appointment with Dr. Caryl Bis as well just after this appointment. Orders for BMP/ CBC given to the patient to be done at Dr. Ellen Henri office in case he needs anything additional added on. I have advised the patient if she has any terrible getting labs done at Dr. Ellen Henri office to please call me back and let me know and we will get her set up to come back here for her lab draw. She voices understanding.

## 2017-09-29 NOTE — Addendum Note (Signed)
Addended by: Leeanne Rio on: 09/29/2017 12:50 PM   Modules accepted: Orders

## 2017-09-29 NOTE — Assessment & Plan Note (Signed)
Recheck magnesium. 

## 2017-09-29 NOTE — Progress Notes (Signed)
[name], MD Phone: [phone]  [name] is a 77 y.o. female who presents today for follow-up.  A. fib: Recent diagnosis as she presented to the office with A. fib with RVR.  She was hospitalized from 09/10/17-09/12/17.  She was started on a Cardizem drip and pulse improved and she was transitioned to Cardizem and Lopressor by mouth.  Started on Xarelto.  She has done well since discharge.  No palpitations.  Her breathing has improved significantly.  No chest pain.  No bleeding issues.  She is no longer on aspirin.  They did diagnose her with new onset diastolic congestive heart failure with an LV EF 55-60%.  She has been on p.o. Lasix 40 mg daily.  Her edema has improved as well.  Additionally found to be hypokalemic and hypomagnesemic.  These were repleted.  These need to be followed up on.  She has followed up with EP and they plan to do cardioversion next week.  She is also seeing her podiatrist and her toe wound is healing quite well.  She does follow up with them.  Discharge summary reviewed.  Medications reviewed.  Social History   Tobacco Use  Smoking Status Former Smoker  . Packs/day: 1.00  . Years: 40.00  . Pack years: 40.00  . Types: Cigarettes  . Last attempt to quit: 07/21/1995  . Years since quitting: 22.2  Smokeless Tobacco Never Used     ROS see history of present illness  Objective  Physical Exam Vitals:   09/29/17 1141 09/29/17 1210  BP: 118/80   Pulse: (!) 113 84  Temp: 97.8 F (36.6 C)   SpO2: 98%     BP Readings from Last 3 Encounters:  09/29/17 118/80  09/29/17 110/80  09/16/17 118/66   Wt Readings from Last 3 Encounters:  09/29/17 122 lb 3.2 oz (55.4 kg)  09/29/17 122 lb (55.3 kg)  09/16/17 123 lb (55.8 kg)    Physical Exam  Constitutional: No distress.  Cardiovascular: Normal rate and normal heart sounds. An irregularly irregular rhythm present.  Pulmonary/Chest: Effort normal and breath sounds normal.  Neurological: She is  alert. Gait normal.  Skin: Skin is warm and dry. She is not diaphoretic.     Assessment/Plan: Please see individual problem list.  Atrial fibrillation ([place]) Rate controlled.  She is can undergo cardioversion next week.  She will continue to follow with cardiology and continue her current regimen.  Given return precautions.  Leg swelling Suspect related to venous insufficiency though she was diagnosed with diastolic CHF though her echo was not technically sufficient to allow diastolic evaluation.  Swelling has improved with Lasix.  Discussed monitoring her swelling.  Pain of toe of left foot She is following with podiatry for this.  Continuing on antibiotic ointment.  She will continue to see them.  Hypomagnesemia Recheck magnesium.  Hypokalemia Needs recheck.  It appears this was already drawn through cardiology.  It was unclear at first whether or not these things were collected given that the patient came with order forms and it sounded as though they wanted [place] to draw these though they appear that they were already drawn in the system though it is somewhat unclear.  We will have the lab contact them to determine if they had been drawn.   Health Maintenance: UTD  Orders Placed This Encounter  Procedures  . Magnesium    No orders of the defined types were placed in this encounter.    [name], MD [facility]  [place]

## 2017-09-29 NOTE — Assessment & Plan Note (Signed)
She is following with podiatry for this.  Continuing on antibiotic ointment.  She will continue to see them.

## 2017-09-29 NOTE — Patient Instructions (Signed)
Nice to see you. We will get lab work today and contact you with the results. 

## 2017-09-29 NOTE — Progress Notes (Signed)
   Subjective:  77 year old female presenting today for follow up evaluation of an ulceration to the 3rd left toe. She states the wound is improving and she has completed the course of Doxycycline. There are no modifying factors noted. She denies any new complaints at this time. Patient is here for further evaluation and treatment.    Past Medical History:  Diagnosis Date  . Breast cancer ([place]) 2001   left breast  . Cancer ([place]) 2001   left breast ca  . GERD (gastroesophageal reflux disease)   . Hyperlipidemia   . Hypertension   . Osteoarthritis, multiple sites   . Osteoarthrosis involving, or with mention of more than one site, but not specified as generalized, multiple sites   . Osteopenia   . Personal history of radiation therapy 2001   left breast ca  . Psoriasis      Objective/Physical Exam General: The patient is alert and oriented x3 in no acute distress.  Dermatology:  Wound #1 noted to the 3rd toe of the left foot measuring 0.4 x 0.2 x 0.2 cm (LxWxD).   To the noted ulceration(s), there is no eschar. There is a moderate amount of slough, fibrin, and necrotic tissue noted. Granulation tissue and wound base is red. There is a minimal amount of serosanguineous drainage noted. There is no exposed bone muscle-tendon ligament or joint. There is no malodor. Periwound integrity is intact. Skin is warm, dry and supple bilateral lower extremities.  Vascular: Palpable pedal pulses bilaterally. Mild edema noted. Capillary refill within normal limits. Varicosities noted bilateral lower extremities.   Neurological: Epicritic and protective threshold absent bilaterally.   Musculoskeletal Exam: Range of motion within normal limits to all pedal and ankle joints bilateral. Muscle strength 5/5 in all groups bilateral.   Assessment: #1 ulceration of the 3rd left toe secondary to venous insufficiency #2 varicosities bilateral lower extremities  Plan of Care:  #1 Patient was  evaluated. #2 medically necessary excisional debridement including subcutaneous tissue was performed using a tissue nipper and a chisel blade. Excisional debridement of all the necrotic nonviable tissue down to healthy bleeding viable tissue was performed with post-debridement measurements same as pre-. #3 the wound was cleansed with normal saline. #4 Continue using gentamicin cream with a Band-Aid daily.  #5 Continue using toe crest pads. #6 Patient has a cardioversion procedure on 10/04/17 with [employer].  #7 Return to clinic in 3 weeks.   Ask how her social security phone interview went.    Edrick Kins, DPM [facility]  Dr. Edrick Kins, [address]                                        [place], [place] [address]                Office [phone]  Fax [phone]

## 2017-09-29 NOTE — Assessment & Plan Note (Signed)
Rate controlled.  She is can undergo cardioversion next week.  She will continue to follow with cardiology and continue her current regimen.  Given return precautions.

## 2017-09-30 ENCOUNTER — Ambulatory Visit: Payer: Medicare Other

## 2017-09-30 ENCOUNTER — Other Ambulatory Visit: Payer: Self-pay | Admitting: Family Medicine

## 2017-09-30 DIAGNOSIS — R2681 Unsteadiness on feet: Secondary | ICD-10-CM | POA: Diagnosis not present

## 2017-09-30 DIAGNOSIS — Z9181 History of falling: Secondary | ICD-10-CM

## 2017-09-30 DIAGNOSIS — N179 Acute kidney failure, unspecified: Secondary | ICD-10-CM

## 2017-09-30 DIAGNOSIS — M6281 Muscle weakness (generalized): Secondary | ICD-10-CM

## 2017-09-30 LAB — MAGNESIUM: Magnesium: 1.6 mg/dL (ref 1.5–2.5)

## 2017-09-30 NOTE — Therapy (Signed)
[facility] [address], [place], [address] Phone: [phone]   Fax:  [phone]  Physical Therapy Treatment  Patient Details  Name: [name] MRN: [id number] Date of Birth: 27-Apr-1941 Referring Provider: Caryl Bis MD   Encounter Date: 09/30/2017  PT End of Session - 09/30/17 0956    Visit Number  15    Number of Visits  24    Date for PT Re-Evaluation  10/28/17    PT Start Time  0905    PT Stop Time  0943    PT Time Calculation (min)  38 min    Equipment Utilized During Treatment  Gait belt    Activity Tolerance  Patient tolerated treatment well    Behavior During Therapy  [facility] for tasks assessed/performed       Past Medical History:  Diagnosis Date  . Breast cancer ([place]) 2001   left breast  . Cancer ([place]) 2001   left breast ca  . GERD (gastroesophageal reflux disease)   . Hyperlipidemia   . Hypertension   . Osteoarthritis, multiple sites   . Osteoarthrosis involving, or with mention of more than one site, but not specified as generalized, multiple sites   . Osteopenia   . Personal history of radiation therapy 2001   left breast ca  . Psoriasis     Past Surgical History:  Procedure Laterality Date  . ABDOMINAL HYSTERECTOMY    . ANKLE FRACTURE SURGERY  4/08   left---hardware still in place  . 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 BREAST EXCISIONAL BIOPSY Left yrs ago   benign  . BREAST LUMPECTOMY  2001   left  . CAROTID ENDARTERECTOMY Left 10/23/2004  . FRACTURE SURGERY    . SHOULDER SURGERY  6/07   left  . TONSILLECTOMY AND ADENOIDECTOMY    . TOTAL HIP ARTHROPLASTY  2004   right    There were no vitals filed for this visit.  Subjective Assessment - 09/30/17 0947    Subjective  Patient reports she's feeling good today and states she went to her MD.  Patient states she went to her MD who reports she continues to be in a-fib.     Pertinent History  Patient previously  seen for balance difficulties and shoulder pain and the right.  Patient reports no falls in the past six months. Patient reports pain has not improved for the past month and has been about the same since the onset of pain.     Limitations  Walking    Patient Stated Goals  To improve balance when walking     Currently in Pain?  No/denies        TREATMENT:   Therapeutic Exercise:   Nustep step level 5 - x5 min; with use of arms and legs to improve strengthening  Sit to stands with focus on speed of performance - x 5 Single leg stance - 5 x 30sec performed with intermittent UE support Backwards walking with EO, EC - 39ftx4 Forwards walking with EC - 27ft x 4 Tandem ambulation with intermittent therapist support - 4 x 60ft    Pt demonstrates increased fatigue with exercise at end of session.   PT Education - 09/30/17 0956    Education provided  Yes    Education Details  form/technique with exercise    Person(s) Educated  Patient    Methods  Explanation;Demonstration    Comprehension  Verbalized understanding;Returned demonstration  PT Long Term Goals - 09/30/17 5400      PT LONG TERM GOAL #1   Title  Patient will be independent with HEP to continue benefits of therapy after discharge.     Baseline  Dependent with form and technique for balancing exercises; moderate cueing for balance and strengthening    Time  4    Period  Weeks    Status  On-going      PT LONG TERM GOAL #2   Title  Patient will improve FGA to over 26/30 to indicate functional improvement in strength and balance limitations and decrease fall risk    Baseline  22/30; ; 09/01/2017: 24/30; 09/30/2017: 25/30    Time  8    Period  Weeks    Status  On-going      PT LONG TERM GOAL #3   Title  Patient will improve 5xSTS to under 16 seconds without use of a pillow to indicate functional improvement of LE function and decreased fall risk    Baseline  19 sec with pillow under chair; 09/01/2017 12 seconds with  use of a pillow; 09/30/2017: 10.9 sec with use of pillow     Time  8    Period  Weeks    Status  On-going      PT LONG TERM GOAL #4   Title  Patient will be able to balance for 10sec with SLS to improve static balance and ability to get dressed when in standing    Baseline  1 sec; 09/01/2017: 3 sec each leg; 09/30/17: 4.5    Time  8    Period  Weeks    Status  On-going            Plan - 09/30/17 0957    Clinical Impression Statement  Patient demonstrates significant improvement with sit to stands, single leg stance, and FGA indicating functional carryover and improvement. Patient continues to demonstrates difficulty with balancing with narrow BOS most notably with walking indicating decreased dynamic balance in standing. Patient will benefit from further skilled therapy to return to prior level of function.     Rehab Potential  Good    Clinical Impairments Affecting Rehab Potential  Positive: motivation; Negative: , age    PT Frequency  2x / week    PT Duration  6 weeks    PT Treatment/Interventions  ADLs/Self Care Home Management;Aquatic Therapy;Biofeedback;DME Instruction;Gait training;Stair training;Functional mobility training;Therapeutic activities;Therapeutic exercise;Balance training;Neuromuscular re-education;Patient/family education;Passive range of motion;Energy conservation;Ultrasound;Moist Heat;Iontophoresis 4mg /ml Dexamethasone;Cryotherapy;Electrical Stimulation;Manual techniques;Dry needling    PT Next Visit Plan  Progress balance and strengthening exercises,     PT Home Exercise Plan  See education section    Consulted and Agree with Plan of Care  Patient       Patient will benefit from skilled therapeutic intervention in order to improve the following deficits and impairments:  Pain, Increased fascial restricitons, Decreased coordination, Decreased mobility, Decreased activity tolerance, Decreased endurance, Decreased range of motion, Decreased strength, Hypomobility,  Postural dysfunction, Abnormal gait, Decreased balance, Difficulty walking  Visit Diagnosis: Unsteadiness on feet  History of falling  Muscle weakness (generalized)     Problem List Patient Active Problem List   Diagnosis Date Noted  . Hypomagnesemia 09/29/2017  . Hypokalemia 09/29/2017  . Atrial fibrillation ([place]) 09/10/2017  . Trapezius strain, right, initial encounter 09/10/2017  . Depression, major, single episode, mild ([place]) 09/10/2017  . Chronic venous insufficiency 09/09/2017  . Lymphedema 09/09/2017  . Pain of toe of left foot 08/17/2017  .  Grief 08/17/2017  . Epistaxis 07/31/2017  . Leg swelling 07/31/2017  . Dry eyes 07/06/2017  . Pain and swelling of lower extremity, right 03/29/2017  . Right shoulder pain 03/29/2017  . Diarrhea 05/18/2016  . Fall 05/01/2016  . Unintentional weight loss 12/30/2015  . Leg weakness, bilateral 12/30/2015  . Hyperkalemia 01/01/2015  . Occasional numbness/prickling/tingling of fingers and toes 12/18/2014  . Routine general medical examination at a health care facility 12/13/2013  . Macrocytic anemia 05/19/2013  . 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 Psoriasis   . Osteoarthritis, multiple sites   . Episodic mood disorder ([place]) 12/01/2011  . History of breast cancer 10/28/2011  . GERD (gastroesophageal reflux disease)   . Hyperlipidemia   . Hypertension   . Osteopenia   . Carotid stenosis 05/27/2011    Blythe Stanford, PT DPT  09/30/2017, 10:20 AM  [facility] [address], [place], [address] Phone: [phone]   Fax:  [phone]  Name: [name] MRN: [id number] Date of Birth: 13-Jul-1941

## 2017-10-04 ENCOUNTER — Ambulatory Visit
Admission: RE | Admit: 2017-10-04 | Discharge: 2017-10-04 | Disposition: A | Discharge status: Home / Self Care | Payer: Medicare Other | Source: Ambulatory Visit | Attending: Cardiovascular Disease | Admitting: Cardiovascular Disease

## 2017-10-04 ENCOUNTER — Ambulatory Visit: Payer: Medicare Other | Admitting: Anesthesiology

## 2017-10-04 ENCOUNTER — Ambulatory Visit: Payer: Medicare Other

## 2017-10-04 ENCOUNTER — Encounter
Admission: RE | Disposition: A | Discharge status: Home / Self Care | Payer: Self-pay | Source: Ambulatory Visit | Attending: Cardiovascular Disease

## 2017-10-04 DIAGNOSIS — Z853 Personal history of malignant neoplasm of breast: Secondary | ICD-10-CM | POA: Diagnosis not present

## 2017-10-04 DIAGNOSIS — K219 Gastro-esophageal reflux disease without esophagitis: Secondary | ICD-10-CM | POA: Diagnosis not present

## 2017-10-04 DIAGNOSIS — I739 Peripheral vascular disease, unspecified: Secondary | ICD-10-CM | POA: Diagnosis not present

## 2017-10-04 DIAGNOSIS — Z96641 Presence of right artificial hip joint: Secondary | ICD-10-CM | POA: Insufficient documentation

## 2017-10-04 DIAGNOSIS — F329 Major depressive disorder, single episode, unspecified: Secondary | ICD-10-CM | POA: Diagnosis not present

## 2017-10-04 DIAGNOSIS — Z7901 Long term (current) use of anticoagulants: Secondary | ICD-10-CM | POA: Insufficient documentation

## 2017-10-04 DIAGNOSIS — I10 Essential (primary) hypertension: Secondary | ICD-10-CM | POA: Diagnosis not present

## 2017-10-04 DIAGNOSIS — Z79899 Other long term (current) drug therapy: Secondary | ICD-10-CM | POA: Insufficient documentation

## 2017-10-04 DIAGNOSIS — I4891 Unspecified atrial fibrillation: Secondary | ICD-10-CM | POA: Diagnosis not present

## 2017-10-04 DIAGNOSIS — F320 Major depressive disorder, single episode, mild: Secondary | ICD-10-CM | POA: Diagnosis not present

## 2017-10-04 DIAGNOSIS — I5033 Acute on chronic diastolic (congestive) heart failure: Secondary | ICD-10-CM | POA: Diagnosis not present

## 2017-10-04 DIAGNOSIS — I481 Persistent atrial fibrillation: Secondary | ICD-10-CM

## 2017-10-04 DIAGNOSIS — Z923 Personal history of irradiation: Secondary | ICD-10-CM | POA: Diagnosis not present

## 2017-10-04 DIAGNOSIS — Z7982 Long term (current) use of aspirin: Secondary | ICD-10-CM | POA: Diagnosis not present

## 2017-10-04 DIAGNOSIS — I4819 Other persistent atrial fibrillation: Secondary | ICD-10-CM

## 2017-10-04 DIAGNOSIS — Z87891 Personal history of nicotine dependence: Secondary | ICD-10-CM | POA: Diagnosis not present

## 2017-10-04 DIAGNOSIS — I110 Hypertensive heart disease with heart failure: Secondary | ICD-10-CM | POA: Insufficient documentation

## 2017-10-04 DIAGNOSIS — E785 Hyperlipidemia, unspecified: Secondary | ICD-10-CM | POA: Diagnosis not present

## 2017-10-04 HISTORY — PX: CARDIOVERSION: EP1203

## 2017-10-04 HISTORY — DX: Cardiac arrhythmia, unspecified: I49.9

## 2017-10-04 SURGERY — CARDIOVERSION (CATH LAB)
Anesthesia: General

## 2017-10-04 MED ORDER — FENTANYL CITRATE (PF) 100 MCG/2ML IJ SOLN
INTRAMUSCULAR | Status: AC
  Filled 2017-10-04: qty 2

## 2017-10-04 MED ORDER — SODIUM CHLORIDE 0.9 % IV SOLN
INTRAVENOUS | Status: DC
  Administered 2017-10-04: 07:00:00 via INTRAVENOUS

## 2017-10-04 MED ORDER — MIDAZOLAM HCL 2 MG/2ML IJ SOLN
INTRAMUSCULAR | Status: DC | PRN
  Administered 2017-10-04: .5 mg via INTRAVENOUS

## 2017-10-04 MED ORDER — MIDAZOLAM HCL 2 MG/2ML IJ SOLN
INTRAMUSCULAR | Status: AC
  Filled 2017-10-04: qty 2

## 2017-10-04 MED ORDER — PROPOFOL 10 MG/ML IV BOLUS
INTRAVENOUS | Status: AC
  Filled 2017-10-04: qty 20

## 2017-10-04 MED ORDER — FENTANYL CITRATE (PF) 100 MCG/2ML IJ SOLN
INTRAMUSCULAR | Status: DC | PRN
  Administered 2017-10-04: 25 ug via INTRAVENOUS

## 2017-10-04 MED ORDER — EPHEDRINE SULFATE 50 MG/ML IJ SOLN
INTRAMUSCULAR | Status: AC
  Filled 2017-10-04: qty 1

## 2017-10-04 MED ORDER — PROPOFOL 10 MG/ML IV BOLUS
INTRAVENOUS | Status: DC | PRN
  Administered 2017-10-04: 20 mg via INTRAVENOUS
  Administered 2017-10-04: 40 mg via INTRAVENOUS

## 2017-10-04 NOTE — CV Procedure (Signed)
Cardioversion note: A standard informed consent was obtained. Timeout was performed. The pads were placed in the anterior posterior fashion. The patient was given propofol by the anesthesia team.  Successful cardioversion was performed with a 200 J after the second shock. The patient converted to sinus rhythm. Pre-and post EKGs were reviewed. The patient tolerated the procedure with no immediate complications.  Recommendations: Continue same medications and follow-up in 2-3 weeks.

## 2017-10-04 NOTE — Interval H&P Note (Signed)
History and Physical Interval Note:  10/04/2017 8:13 AM  [name]  has presented today for surgery, with the diagnosis of Cardioversion    Afib  The various methods of treatment have been discussed with the patient and family. After consideration of risks, benefits and other options for treatment, the patient has consented to  Procedure(s): CARDIOVERSION (N/A) as a surgical intervention .  The patient's history has been reviewed, patient examined, no change in status, stable for surgery.  I have reviewed the patient's chart and labs.  Questions were answered to the patient's satisfaction.     Kathlyn Sacramento

## 2017-10-04 NOTE — Anesthesia Postprocedure Evaluation (Signed)
Anesthesia Post Note  Patient: [name]  Procedure(s) Performed: CARDIOVERSION (N/A )  Patient location during evaluation: Cath Lab Anesthesia Type: General Level of consciousness: awake and alert Pain management: pain level controlled Vital Signs Assessment: post-procedure vital signs reviewed and stable Respiratory status: spontaneous breathing, nonlabored ventilation, respiratory function stable and patient connected to nasal cannula oxygen Cardiovascular status: blood pressure returned to baseline and stable Postop Assessment: no apparent nausea or vomiting Anesthetic complications: no     Last Vitals:  Vitals:   10/04/17 0815 10/04/17 0830  BP: (!) 147/84 139/87  Pulse: 76 77  Resp: 18 (!) 21  Temp:    SpO2: 99% 97%    Last Pain:  Vitals:   10/04/17 0647  TempSrc: Oral                 Precious Haws Daxtin Leiker

## 2017-10-04 NOTE — Anesthesia Post-op Follow-up Note (Signed)
Anesthesia QCDR form completed.        

## 2017-10-04 NOTE — Anesthesia Preprocedure Evaluation (Signed)
Anesthesia Evaluation  Patient identified by MRN, date of birth, ID band Patient awake    Reviewed: Allergy & Precautions, H&P , NPO status , Patient's Chart, lab work & pertinent test results  History of Anesthesia Complications Negative for: history of anesthetic complications  Airway Mallampati: III  TM Distance: <3 FB Neck ROM: limited    Dental  (+) Chipped   Pulmonary neg shortness of breath, former smoker,           Cardiovascular Exercise Tolerance: Good hypertension, (-) angina+ Peripheral Vascular Disease  (-) Past MI and (-) DOE + dysrhythmias Atrial Fibrillation      Neuro/Psych PSYCHIATRIC DISORDERS Depression  Neuromuscular disease    GI/Hepatic Neg liver ROS, GERD  Medicated and Controlled,  Endo/Other  negative endocrine ROS  Renal/GU negative Renal ROS  negative genitourinary   Musculoskeletal  (+) Arthritis ,   Abdominal   Peds  Hematology negative hematology ROS (+)   Anesthesia Other Findings Past Medical History: 2001: Breast cancer ([place])     Comment:  left breast 2001: Cancer ([place])     Comment:  left breast ca No date: Dysrhythmia No date: GERD (gastroesophageal reflux disease) No date: Hyperlipidemia No date: Hypertension No date: Osteoarthritis, multiple sites No date: Osteoarthrosis involving, or with mention of more than one  site, but not specified as generalized, multiple sites No date: Osteopenia 2001: Personal history of radiation therapy     Comment:  left breast ca No date: Psoriasis  Past Surgical History: No date: ABDOMINAL HYSTERECTOMY 4/08: ANKLE FRACTURE SURGERY     Comment:  left---hardware still in place 2001: BREAST BIOPSY; Left     Comment:  breast ca yrs ago: BREAST EXCISIONAL BIOPSY; Left     Comment:  benign 2001: BREAST LUMPECTOMY     Comment:  left 10/23/2004: CAROTID ENDARTERECTOMY; Left No date: FRACTURE SURGERY 6/07: SHOULDER SURGERY     Comment:   left No date: TONSILLECTOMY AND ADENOIDECTOMY 2004: TOTAL HIP ARTHROPLASTY     Comment:  right  BMI    Body Mass Index:  19.40 kg/m      Reproductive/Obstetrics negative OB ROS                             Anesthesia Physical Anesthesia Plan  ASA: III  Anesthesia Plan: General   Post-op Pain Management:    Induction: Intravenous  PONV Risk Score and Plan: Propofol infusion and TIVA  Airway Management Planned: Natural Airway and Nasal Cannula  Additional Equipment:   Intra-op Plan:   Post-operative Plan:   Informed Consent: I have reviewed the patients History and Physical, chart, labs and discussed the procedure including the risks, benefits and alternatives for the proposed anesthesia with the patient or authorized representative who has indicated his/her understanding and acceptance.   Dental Advisory Given  Plan Discussed with: Anesthesiologist, CRNA and Surgeon  Anesthesia Plan Comments: (Patient consented for risks of anesthesia including but not limited to:  - adverse reactions to medications - risk of intubation if required - damage to teeth, lips or other oral mucosa - sore throat or hoarseness - Damage to heart, brain, lungs or loss of life  Patient voiced understanding.)        Anesthesia Quick Evaluation

## 2017-10-04 NOTE — Transfer of Care (Signed)
Immediate Anesthesia Transfer of Care Note  Patient: [name]  Procedure(s) Performed: CARDIOVERSION (N/A )  Patient Location: PACU  Anesthesia Type:General  Level of Consciousness: sedated  Airway & Oxygen Therapy: Patient Spontanous Breathing and Patient connected to nasal cannula oxygen  Post-op Assessment: Report given to RN and Post -op Vital signs reviewed and stable  Post vital signs: Reviewed and stable  Last Vitals:  Vitals:   10/04/17 0647  BP: (!) 131/96  Pulse: (!) 114  Resp: 20  Temp: 36.7 C  SpO2: 97%    Last Pain:  Vitals:   10/04/17 0647  TempSrc: Oral         Complications: No apparent anesthesia complications

## 2017-10-04 NOTE — Anesthesia Procedure Notes (Signed)
Date/Time: 10/04/2017 7:35 AM Performed by: Allean Found, CRNA Pre-anesthesia Checklist: Patient identified, Emergency Drugs available, Suction available, Patient being monitored and Timeout performed Patient Re-evaluated:Patient Re-evaluated prior to induction Oxygen Delivery Method: Nasal cannula Induction Type: IV induction Placement Confirmation: positive ETCO2 Dental Injury: Teeth and Oropharynx as per pre-operative assessment

## 2017-10-05 DIAGNOSIS — H353132 Nonexudative age-related macular degeneration, bilateral, intermediate dry stage: Secondary | ICD-10-CM | POA: Diagnosis not present

## 2017-10-06 ENCOUNTER — Ambulatory Visit: Payer: Medicare Other

## 2017-10-06 DIAGNOSIS — Z9181 History of falling: Secondary | ICD-10-CM | POA: Diagnosis not present

## 2017-10-06 DIAGNOSIS — M6281 Muscle weakness (generalized): Secondary | ICD-10-CM

## 2017-10-06 DIAGNOSIS — R2681 Unsteadiness on feet: Secondary | ICD-10-CM | POA: Diagnosis not present

## 2017-10-06 NOTE — Therapy (Signed)
[facility] [address], [place], [address] Phone: [phone]   Fax:  [phone]  Physical Therapy Treatment  Patient Details  Name: [name] MRN: [id number] Date of Birth: [birth year] Referring Provider: Caryl Bis MD   Encounter Date: 10/06/2017  PT End of Session - 10/06/17 1023    Visit Number  16    Number of Visits  24    Date for PT Re-Evaluation  10/28/17    PT Start Time  0945    PT Stop Time  1030    PT Time Calculation (min)  45 min    Equipment Utilized During Treatment  Gait belt    Activity Tolerance  Patient tolerated treatment well    Behavior During Therapy  [facility] for tasks assessed/performed       Past Medical History:  Diagnosis Date  . Breast cancer ([place]) 2001   left breast  . Cancer ([place]) 2001   left breast ca  . Dysrhythmia   . GERD (gastroesophageal reflux disease)   . Hyperlipidemia   . Hypertension   . Osteoarthritis, multiple sites   . Osteoarthrosis involving, or with mention of more than one site, but not specified as generalized, multiple sites   . Osteopenia   . Personal history of radiation therapy 2001   left breast ca  . Psoriasis     Past Surgical History:  Procedure Laterality Date  . ABDOMINAL HYSTERECTOMY    . ANKLE FRACTURE SURGERY  4/08   left---hardware still in place  . 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 BREAST EXCISIONAL BIOPSY Left yrs ago   benign  . BREAST LUMPECTOMY  2001   left  . CARDIOVERSION N/A 10/04/2017   Procedure: CARDIOVERSION;  Surgeon: Wellington Hampshire, MD;  Location: [facility] ORS;  Service: Cardiovascular;  Laterality: N/A;  . CAROTID ENDARTERECTOMY Left 10/23/2004  . FRACTURE SURGERY    . SHOULDER SURGERY  6/07   left  . TONSILLECTOMY AND ADENOIDECTOMY    . TOTAL HIP ARTHROPLASTY  2004   right    There were no vitals filed for this visit.  Subjective Assessment - 10/06/17 1002    Subjective  Patient reports  she had her cardioversion on Monday which went well. Patient reports she'd like to perform exercises lightly today.     Pertinent History  Patient previously seen for balance difficulties and shoulder pain and the right.  Patient reports no falls in the past six months. Patient reports pain has not improved for the past month and has been about the same since the onset of pain.     Limitations  Walking    Patient Stated Goals  To improve balance when walking     Currently in Pain?  No/denies       TREATMENT:   Therapeutic Exercise:   Nustep step level 4 - x5 min; with use of arms and legs to improve strengthening  Tandem ambulation with intermittent therapist support - 4 x 21ft Walking star excursion stepping - x15 step cw/ccw Hip abduction at hip machine - 40# -- x 15 B Side stepping along airex beam-2 x 7 Sit to stands with TRX straps - x10  Forward/backward balancing on wobbleboard in standing - x 20     Pt demonstrates increased fatigue with exercise at end of session.    PT Education - 10/06/17 1023    Education provided  Yes    Education Details  form/technique with exercise    Person(s) Educated  Patient    Methods  Explanation;Demonstration    Comprehension  Verbalized understanding;Returned demonstration          PT Long Term Goals - 09/30/17 3500      PT LONG TERM GOAL #1   Title  Patient will be independent with HEP to continue benefits of therapy after discharge.     Baseline  Dependent with form and technique for balancing exercises; moderate cueing for balance and strengthening    Time  4    Period  Weeks    Status  On-going      PT LONG TERM GOAL #2   Title  Patient will improve FGA to over 26/30 to indicate functional improvement in strength and balance limitations and decrease fall risk    Baseline  22/30; ; 09/01/2017: 24/30; 09/30/2017: 25/30    Time  8    Period  Weeks    Status  On-going      PT LONG TERM GOAL #3   Title  Patient will improve  5xSTS to under 16 seconds without use of a pillow to indicate functional improvement of LE function and decreased fall risk    Baseline  19 sec with pillow under chair; 09/01/2017 12 seconds with use of a pillow; 09/30/2017: 10.9 sec with use of pillow     Time  8    Period  Weeks    Status  On-going      PT LONG TERM GOAL #4   Title  Patient will be able to balance for 10sec with SLS to improve static balance and ability to get dressed when in standing    Baseline  1 sec; 09/01/2017: 3 sec each leg; 09/30/17: 4.5    Time  8    Period  Weeks    Status  On-going            Plan - 10/06/17 1025    Clinical Impression Statement  Continued to focus on improving patients strength and endurance with exercises in standing to address limitations in standing. Performed exercises at a lower intensity today seocndary to the patient's cardioversion over the past Monday. Patient demonstrates increased postural sway with exercises indicating decreased balance most notably with movement. Patient will benefit from further skilled therapy to return to prior level of function.      Rehab Potential  Good    Clinical Impairments Affecting Rehab Potential  Positive: motivation; Negative: , age    PT Frequency  2x / week    PT Duration  6 weeks    PT Treatment/Interventions  ADLs/Self Care Home Management;Aquatic Therapy;Biofeedback;DME Instruction;Gait training;Stair training;Functional mobility training;Therapeutic activities;Therapeutic exercise;Balance training;Neuromuscular re-education;Patient/family education;Passive range of motion;Energy conservation;Ultrasound;Moist Heat;Iontophoresis 4mg /ml Dexamethasone;Cryotherapy;Electrical Stimulation;Manual techniques;Dry needling    PT Next Visit Plan  Progress balance and strengthening exercises,     PT Home Exercise Plan  See education section    Consulted and Agree with Plan of Care  Patient       Patient will benefit from skilled therapeutic intervention  in order to improve the following deficits and impairments:  Pain, Increased fascial restricitons, Decreased coordination, Decreased mobility, Decreased activity tolerance, Decreased endurance, Decreased range of motion, Decreased strength, Hypomobility, Postural dysfunction, Abnormal gait, Decreased balance, Difficulty walking  Visit Diagnosis: Unsteadiness on feet  History of falling  Muscle weakness (generalized)     Problem List Patient Active Problem List   Diagnosis Date Noted  . Hypomagnesemia 09/29/2017  . Hypokalemia  09/29/2017  . Atrial fibrillation ([place]) 09/10/2017  . Trapezius strain, right, initial encounter 09/10/2017  . Depression, major, single episode, mild ([place]) 09/10/2017  . Chronic venous insufficiency 09/09/2017  . Lymphedema 09/09/2017  . Pain of toe of left foot 08/17/2017  . Grief 08/17/2017  . Epistaxis 07/31/2017  . Leg swelling 07/31/2017  . Dry eyes 07/06/2017  . Pain and swelling of lower extremity, right 03/29/2017  . Right shoulder pain 03/29/2017  . Diarrhea 05/18/2016  . Fall 05/01/2016  . Unintentional weight loss 12/30/2015  . Leg weakness, bilateral 12/30/2015  . Hyperkalemia 01/01/2015  . Occasional numbness/prickling/tingling of fingers and toes 12/18/2014  . Routine general medical examination at a health care facility 12/13/2013  . Macrocytic anemia 05/19/2013  . Psoriatic arthritis ([place]) 02/03/2013  . Psoriasis   . Osteoarthritis, multiple sites   . Episodic mood disorder ([place]) 12/01/2011  . History of breast cancer 10/28/2011  . GERD (gastroesophageal reflux disease)   . Hyperlipidemia   . Hypertension   . Osteopenia   . Carotid stenosis 05/27/2011    Blythe Stanford, PT DPT 10/06/2017, 10:40 AM  [facility] [address] [place], [address] Phone: [phone]   Fax:  [phone]  Name: DARIANE NATZKE MRN: [id number] Date of Birth:  May 29, 1941

## 2017-10-07 ENCOUNTER — Other Ambulatory Visit (INDEPENDENT_AMBULATORY_CARE_PROVIDER_SITE_OTHER): Payer: Medicare Other

## 2017-10-07 DIAGNOSIS — N179 Acute kidney failure, unspecified: Secondary | ICD-10-CM | POA: Diagnosis not present

## 2017-10-08 ENCOUNTER — Other Ambulatory Visit: Payer: Self-pay | Admitting: Family Medicine

## 2017-10-08 LAB — BASIC METABOLIC PANEL
BUN: 26 mg/dL — ABNORMAL HIGH (ref 6–23)
CO2: 31 mEq/L (ref 19–32)
Calcium: 9.9 mg/dL (ref 8.4–10.5)
Chloride: 99 mEq/L (ref 96–112)
Creatinine, Ser: 1.03 mg/dL (ref 0.40–1.20)
GFR: 55 mL/min — ABNORMAL LOW (ref >60.00)
Glucose, Bld: 104 mg/dL — ABNORMAL HIGH (ref 70–99)
Potassium: 4.4 mEq/L (ref 3.5–5.1)
Sodium: 142 mEq/L (ref 135–145)

## 2017-10-08 MED ORDER — FUROSEMIDE 40 MG PO TABS: 40.0000 mg | ORAL_TABLET | Freq: Every day | ORAL | 3 refills | Status: DC

## 2017-10-08 MED ORDER — RIVAROXABAN 15 MG PO TABS: 15.0000 mg | ORAL_TABLET | Freq: Every day | ORAL | 5 refills | Status: DC

## 2017-10-08 NOTE — Telephone Encounter (Signed)
Please advise 

## 2017-10-08 NOTE — Telephone Encounter (Signed)
Request for refills of Xarelo and Lasix. Previous refill was not by Dr. Caryl Bis  LOV:  09/29/17  Dr. Caryl Bis  [facility] [address]

## 2017-10-08 NOTE — Telephone Encounter (Signed)
Copied from [place] [id number]. Topic: Quick Communication - Rx Refill/Question >> Oct 08, 2017  9:43 AM Bea Graff, NT wrote: Medication: Rivaroxaban (XARELTO) 15 MG and furosemide (LASIX) 40 MG Has the patient contacted their pharmacy? Yes.   (Agent: If no, request that the patient contact the pharmacy for the refill.) Preferred Pharmacy (with phone number or street name): [facility] [address] Agent: Please be advised that RX refills may take up to 3 business days. We ask that you follow-up with your pharmacy.

## 2017-10-08 NOTE — Telephone Encounter (Signed)
Spoke with patient.  She is doing well status post cardioversion.  She does feel better.  She reports the cardiologist advised her to continue on the Xarelto after the cardioversion.  She continues on Lasix as well.  Refill sent to pharmacy.

## 2017-10-08 NOTE — Telephone Encounter (Signed)
Last filled by Dr.Chen

## 2017-10-11 ENCOUNTER — Ambulatory Visit: Payer: Medicare Other

## 2017-10-11 DIAGNOSIS — M6281 Muscle weakness (generalized): Secondary | ICD-10-CM | POA: Diagnosis not present

## 2017-10-11 DIAGNOSIS — R2681 Unsteadiness on feet: Secondary | ICD-10-CM

## 2017-10-11 DIAGNOSIS — Z9181 History of falling: Secondary | ICD-10-CM

## 2017-10-11 NOTE — Therapy (Signed)
[facility] [address], [place], [address] Phone: [phone]   Fax:  [phone]  Physical Therapy Treatment  Patient Details  Name: [name] MRN: [id number] Date of Birth: 1940-11-15 Referring Provider: Caryl Bis MD   Encounter Date: 10/11/2017  PT End of Session - 10/11/17 0930    Visit Number  17    Number of Visits  24    Date for PT Re-Evaluation  10/28/17    PT Start Time  0915    PT Stop Time  1000    PT Time Calculation (min)  45 min    Equipment Utilized During Treatment  Gait belt    Activity Tolerance  Patient tolerated treatment well    Behavior During Therapy  [facility] for tasks assessed/performed       Past Medical History:  Diagnosis Date  . Breast cancer ([place]) 2001   left breast  . Cancer ([place]) 2001   left breast ca  . Dysrhythmia   . GERD (gastroesophageal reflux disease)   . Hyperlipidemia   . Hypertension   . Osteoarthritis, multiple sites   . Osteoarthrosis involving, or with mention of more than one site, but not specified as generalized, multiple sites   . Osteopenia   . Personal history of radiation therapy 2001   left breast ca  . Psoriasis     Past Surgical History:  Procedure Laterality Date  . ABDOMINAL HYSTERECTOMY    . ANKLE FRACTURE SURGERY  4/08   left---hardware still in place  . 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 BREAST EXCISIONAL BIOPSY Left yrs ago   benign  . BREAST LUMPECTOMY  2001   left  . CARDIOVERSION N/A 10/04/2017   Procedure: CARDIOVERSION;  Surgeon: Wellington Hampshire, MD;  Location: [facility] ORS;  Service: Cardiovascular;  Laterality: N/A;  . CAROTID ENDARTERECTOMY Left 10/23/2004  . FRACTURE SURGERY    . SHOULDER SURGERY  6/07   left  . TONSILLECTOMY AND ADENOIDECTOMY    . TOTAL HIP ARTHROPLASTY  2004   right    There were no vitals filed for this visit.  Subjective Assessment - 10/11/17 5956    Subjective  Patient reports  she has been feeling better and states she was able to walk around a museum for 3 hours this past weekend.     Pertinent History  Patient previously seen for balance difficulties and shoulder pain and the right.  Patient reports no falls in the past six months. Patient reports pain has not improved for the past month and has been about the same since the onset of pain.     Limitations  Walking    Patient Stated Goals  To improve balance when walking     Currently in Pain?  No/denies          TREATMENT:   Therapeutic Exercise:   Nustep step level 5 - x5 min; with use of arms and legs to improve strengthening  Sit to stands with TRX straps - 2x10  Hip abduction in standing at hip machine - 2 x 10 B 25# Single Leg stance with intermittent UE support 2 x 45sec Walking forward/backwards with GTB around knees - x85ft B Wobble disc in standing balance with intermittent UE support - 2 min; Heel toe raises x30  Side stepping with GTB around knees -- x91ft B Forward backward steping with GTB around  -- x 42ft Heel/toe raises in standing - x  20    Pt demonstrates increased fatigue with exercise at end of session.     PT Education - 10/11/17 [phone]    Education provided  Yes    Education Details  form/technique with exercise    Person(s) Educated  Patient    Methods  Explanation;Demonstration    Comprehension  Verbalized understanding;Returned demonstration          PT Long Term Goals - 09/30/17 0923      PT LONG TERM GOAL #1   Title  Patient will be independent with HEP to continue benefits of therapy after discharge.     Baseline  Dependent with form and technique for balancing exercises; moderate cueing for balance and strengthening    Time  4    Period  Weeks    Status  On-going      PT LONG TERM GOAL #2   Title  Patient will improve FGA to over 26/30 to indicate functional improvement in strength and balance limitations and decrease fall risk    Baseline  22/30; ; 09/01/2017:  24/30; 09/30/2017: 25/30    Time  8    Period  Weeks    Status  On-going      PT LONG TERM GOAL #3   Title  Patient will improve 5xSTS to under 16 seconds without use of a pillow to indicate functional improvement of LE function and decreased fall risk    Baseline  19 sec with pillow under chair; 09/01/2017 12 seconds with use of a pillow; 09/30/2017: 10.9 sec with use of pillow     Time  8    Period  Weeks    Status  On-going      PT LONG TERM GOAL #4   Title  Patient will be able to balance for 10sec with SLS to improve static balance and ability to get dressed when in standing    Baseline  1 sec; 09/01/2017: 3 sec each leg; 09/30/17: 4.5    Time  8    Period  Weeks    Status  On-going            Plan - 10/11/17 0956    Clinical Impression Statement  Patient demonstrates improvement with TRX squats with ability to perform a greater amount of repetitions before onset of LE fatigue. Patient demonstrates difficulty with perfomance of static balance exercises requiring UE support to perform most notably with single leg stance. Patient will benefit from further skilled therapy to return to prior level of function.     Rehab Potential  Good    Clinical Impairments Affecting Rehab Potential  Positive: motivation; Negative: , age    PT Frequency  2x / week    PT Duration  6 weeks    PT Treatment/Interventions  ADLs/Self Care Home Management;Aquatic Therapy;Biofeedback;DME Instruction;Gait training;Stair training;Functional mobility training;Therapeutic activities;Therapeutic exercise;Balance training;Neuromuscular re-education;Patient/family education;Passive range of motion;Energy conservation;Ultrasound;Moist Heat;Iontophoresis 4mg /ml Dexamethasone;Cryotherapy;Electrical Stimulation;Manual techniques;Dry needling    PT Next Visit Plan  Progress balance and strengthening exercises,     PT Home Exercise Plan  See education section    Consulted and Agree with Plan of Care  Patient        Patient will benefit from skilled therapeutic intervention in order to improve the following deficits and impairments:  Pain, Increased fascial restricitons, Decreased coordination, Decreased mobility, Decreased activity tolerance, Decreased endurance, Decreased range of motion, Decreased strength, Hypomobility, Postural dysfunction, Abnormal gait, Decreased balance, Difficulty walking  Visit Diagnosis: Unsteadiness on feet  History of  falling  Muscle weakness (generalized)     Problem List Patient Active Problem List   Diagnosis Date Noted  . Hypomagnesemia 09/29/2017  . Hypokalemia 09/29/2017  . Atrial fibrillation ([place]) 09/10/2017  . Trapezius strain, right, initial encounter 09/10/2017  . Depression, major, single episode, mild ([place]) 09/10/2017  . Chronic venous insufficiency 09/09/2017  . Lymphedema 09/09/2017  . Pain of toe of left foot 08/17/2017  . Grief 08/17/2017  . Epistaxis 07/31/2017  . Leg swelling 07/31/2017  . Dry eyes 07/06/2017  . Pain and swelling of lower extremity, right 03/29/2017  . Right shoulder pain 03/29/2017  . Diarrhea 05/18/2016  . Fall 05/01/2016  . Unintentional weight loss 12/30/2015  . Leg weakness, bilateral 12/30/2015  . Hyperkalemia 01/01/2015  . Occasional numbness/prickling/tingling of fingers and toes 12/18/2014  . Routine general medical examination at a health care facility 12/13/2013  . Macrocytic anemia 05/19/2013  . 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 Psoriasis   . Osteoarthritis, multiple sites   . Episodic mood disorder ([place]) 12/01/2011  . History of breast cancer 10/28/2011  . GERD (gastroesophageal reflux disease)   . Hyperlipidemia   . Hypertension   . Osteopenia   . Carotid stenosis 05/27/2011    Blythe Stanford, PT DPT 10/11/2017, 10:03 AM  [facility] [address], [place], [address] Phone: [phone]   Fax:  [phone]  Name:  NAUDIA CROSLEY MRN: [id number] Date of Birth: 1941-02-08

## 2017-10-13 ENCOUNTER — Ambulatory Visit: Payer: Medicare Other

## 2017-10-13 DIAGNOSIS — Z9181 History of falling: Secondary | ICD-10-CM

## 2017-10-13 DIAGNOSIS — M6281 Muscle weakness (generalized): Secondary | ICD-10-CM | POA: Diagnosis not present

## 2017-10-13 DIAGNOSIS — R2681 Unsteadiness on feet: Secondary | ICD-10-CM

## 2017-10-13 NOTE — Therapy (Signed)
[facility] [address] [place], [address] Phone: [phone]   Fax:  [phone]  Physical Therapy Treatment  Patient Details  Name: [name] MRN: [id number] Date of Birth: [birth year] Referring Provider: Caryl Bis MD   Encounter Date: 10/13/2017  PT End of Session - 10/13/17 1018    Visit Number  18    Number of Visits  24    Date for PT Re-Evaluation  10/28/17    PT Start Time  1000    PT Stop Time  1045    PT Time Calculation (min)  45 min    Equipment Utilized During Treatment  Gait belt    Activity Tolerance  Patient tolerated treatment well    Behavior During Therapy  [facility] for tasks assessed/performed       Past Medical History:  Diagnosis Date  . Breast cancer ([place]) 2001   left breast  . Cancer ([place]) 2001   left breast ca  . Dysrhythmia   . GERD (gastroesophageal reflux disease)   . Hyperlipidemia   . Hypertension   . Osteoarthritis, multiple sites   . Osteoarthrosis involving, or with mention of more than one site, but not specified as generalized, multiple sites   . Osteopenia   . Personal history of radiation therapy 2001   left breast ca  . Psoriasis     Past Surgical History:  Procedure Laterality Date  . ABDOMINAL HYSTERECTOMY    . ANKLE FRACTURE SURGERY  4/08   left---hardware still in place  . 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 BREAST EXCISIONAL BIOPSY Left yrs ago   benign  . BREAST LUMPECTOMY  2001   left  . CARDIOVERSION N/A 10/04/2017   Procedure: CARDIOVERSION;  Surgeon: Wellington Hampshire, MD;  Location: [facility] ORS;  Service: Cardiovascular;  Laterality: N/A;  . CAROTID ENDARTERECTOMY Left 10/23/2004  . FRACTURE SURGERY    . SHOULDER SURGERY  6/07   left  . TONSILLECTOMY AND ADENOIDECTOMY    . TOTAL HIP ARTHROPLASTY  2004   right    There were no vitals filed for this visit.  Subjective Assessment - 10/13/17 1014    Subjective  Patient reports no  major changes since the previous visit. Patient states she continues to have a lot of paperwork.     Pertinent History  Patient previously seen for balance difficulties and shoulder pain and the right.  Patient reports no falls in the past six months. Patient reports pain has not improved for the past month and has been about the same since the onset of pain.     Limitations  Walking    Patient Stated Goals  To improve balance when walking     Currently in Pain?  No/denies       TREATMENT:   Therapeutic Exercise:   Nustep step level 5 - x5 min; with use of arms and legs to improve strengthening  Sit to stands with TRX straps - 2x10  Wobble disc in standing balance with intermittent UE support - 2 min; forward/backward/ side to side  Ambulation with head turn up/down, left/right with EC - 2 x 99ft Side stepping along airex beam with airex pad placed underneath - 2 x 10 Side stepping with black theraband - x5 (4 steps)   Pt demonstrates increased fatigue with exercise at end of session.    PT Education - 10/13/17 1016    Education provided  Yes  Education Details  form/technique with exercise    Person(s) Educated  Patient    Methods  Explanation;Demonstration    Comprehension  Verbalized understanding;Returned demonstration          PT Long Term Goals - 09/30/17 0258      PT LONG TERM GOAL #1   Title  Patient will be independent with HEP to continue benefits of therapy after discharge.     Baseline  Dependent with form and technique for balancing exercises; moderate cueing for balance and strengthening    Time  4    Period  Weeks    Status  On-going      PT LONG TERM GOAL #2   Title  Patient will improve FGA to over 26/30 to indicate functional improvement in strength and balance limitations and decrease fall risk    Baseline  22/30; ; 09/01/2017: 24/30; 09/30/2017: 25/30    Time  8    Period  Weeks    Status  On-going      PT LONG TERM GOAL #3   Title  Patient will  improve 5xSTS to under 16 seconds without use of a pillow to indicate functional improvement of LE function and decreased fall risk    Baseline  19 sec with pillow under chair; 09/01/2017 12 seconds with use of a pillow; 09/30/2017: 10.9 sec with use of pillow     Time  8    Period  Weeks    Status  On-going      PT LONG TERM GOAL #4   Title  Patient will be able to balance for 10sec with SLS to improve static balance and ability to get dressed when in standing    Baseline  1 sec; 09/01/2017: 3 sec each leg; 09/30/17: 4.5    Time  8    Period  Weeks    Status  On-going            Plan - 10/13/17 1030    Clinical Impression Statement  Patient demonstrates difficulty with EC balance indicating strong use and dependence on visiual field for balance. Continued to challenge dynamic balance as she continues to have a difficulty ambulating and fear of loss of balance. Patient will benefit from further skilled therapy to return to prior level of function.     Rehab Potential  Good    Clinical Impairments Affecting Rehab Potential  Positive: motivation; Negative: , age    PT Frequency  2x / week    PT Duration  6 weeks    PT Treatment/Interventions  ADLs/Self Care Home Management;Aquatic Therapy;Biofeedback;DME Instruction;Gait training;Stair training;Functional mobility training;Therapeutic activities;Therapeutic exercise;Balance training;Neuromuscular re-education;Patient/family education;Passive range of motion;Energy conservation;Ultrasound;Moist Heat;Iontophoresis 4mg /ml Dexamethasone;Cryotherapy;Electrical Stimulation;Manual techniques;Dry needling    PT Next Visit Plan  Progress balance and strengthening exercises,     PT Home Exercise Plan  See education section    Consulted and Agree with Plan of Care  Patient       Patient will benefit from skilled therapeutic intervention in order to improve the following deficits and impairments:  Pain, Increased fascial restricitons, Decreased  coordination, Decreased mobility, Decreased activity tolerance, Decreased endurance, Decreased range of motion, Decreased strength, Hypomobility, Postural dysfunction, Abnormal gait, Decreased balance, Difficulty walking  Visit Diagnosis: Unsteadiness on feet  History of falling  Muscle weakness (generalized)     Problem List Patient Active Problem List   Diagnosis Date Noted  . Hypomagnesemia 09/29/2017  . Hypokalemia 09/29/2017  . Atrial fibrillation ([place]) 09/10/2017  . Trapezius strain,  right, initial encounter 09/10/2017  . Depression, major, single episode, mild ([place]) 09/10/2017  . Chronic venous insufficiency 09/09/2017  . Lymphedema 09/09/2017  . Pain of toe of left foot 08/17/2017  . Grief 08/17/2017  . Epistaxis 07/31/2017  . Leg swelling 07/31/2017  . Dry eyes 07/06/2017  . Pain and swelling of lower extremity, right 03/29/2017  . Right shoulder pain 03/29/2017  . Diarrhea 05/18/2016  . Fall 05/01/2016  . Unintentional weight loss 12/30/2015  . Leg weakness, bilateral 12/30/2015  . Hyperkalemia 01/01/2015  . Occasional numbness/prickling/tingling of fingers and toes 12/18/2014  . Routine general medical examination at a health care facility 12/13/2013  . Macrocytic anemia 05/19/2013  . 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 Psoriasis   . Osteoarthritis, multiple sites   . Episodic mood disorder ([place]) 12/01/2011  . History of breast cancer 10/28/2011  . GERD (gastroesophageal reflux disease)   . Hyperlipidemia   . Hypertension   . Osteopenia   . Carotid stenosis 05/27/2011    Blythe Stanford, PT DPT 10/13/2017, 10:48 AM  [facility] [address], [place], [address] Phone: [phone]   Fax:  [phone]  Name: [name] MRN: [id number] Date of Birth: [birth year]

## 2017-10-18 ENCOUNTER — Ambulatory Visit: Payer: Medicare Other | Attending: Family Medicine

## 2017-10-18 ENCOUNTER — Other Ambulatory Visit: Payer: Self-pay | Admitting: Family Medicine

## 2017-10-18 DIAGNOSIS — Z9181 History of falling: Secondary | ICD-10-CM | POA: Insufficient documentation

## 2017-10-18 DIAGNOSIS — M6281 Muscle weakness (generalized): Secondary | ICD-10-CM | POA: Insufficient documentation

## 2017-10-18 DIAGNOSIS — R2681 Unsteadiness on feet: Secondary | ICD-10-CM | POA: Insufficient documentation

## 2017-10-19 ENCOUNTER — Ambulatory Visit (INDEPENDENT_AMBULATORY_CARE_PROVIDER_SITE_OTHER): Payer: Medicare Other | Admitting: Podiatry

## 2017-10-19 ENCOUNTER — Encounter: Payer: Self-pay | Admitting: Podiatry

## 2017-10-19 DIAGNOSIS — L97529 Non-pressure chronic ulcer of other part of left foot with unspecified severity: Secondary | ICD-10-CM | POA: Diagnosis not present

## 2017-10-19 DIAGNOSIS — I83025 Varicose veins of left lower extremity with ulcer other part of foot: Secondary | ICD-10-CM

## 2017-10-19 DIAGNOSIS — L84 Corns and callosities: Secondary | ICD-10-CM

## 2017-10-21 ENCOUNTER — Ambulatory Visit: Payer: Medicare Other

## 2017-10-21 DIAGNOSIS — M6281 Muscle weakness (generalized): Secondary | ICD-10-CM | POA: Diagnosis not present

## 2017-10-21 DIAGNOSIS — R2681 Unsteadiness on feet: Secondary | ICD-10-CM | POA: Diagnosis not present

## 2017-10-21 DIAGNOSIS — Z9181 History of falling: Secondary | ICD-10-CM

## 2017-10-21 NOTE — Progress Notes (Signed)
   Subjective:  77 year old female presenting today for follow up evaluation of an ulceration to the 3rd left toe. She states the area looks well and has improved. She reports some mild continued tenderness of the area. She has been using the gentamicin cream as directed. She reports a new complaint of discoloration of the interspace between the first and second toes of the left foot that appeared about two weeks ago. There are no modifying factors noted and has not done anything for treatment. She denies pain. Patient is here for further evaluation and treatment.    Past Medical History:  Diagnosis Date  . Breast cancer ([place]) 2001   left breast  . Cancer ([place]) 2001   left breast ca  . Dysrhythmia   . GERD (gastroesophageal reflux disease)   . Hyperlipidemia   . Hypertension   . Osteoarthritis, multiple sites   . Osteoarthrosis involving, or with mention of more than one site, but not specified as generalized, multiple sites   . Osteopenia   . Personal history of radiation therapy 2001   left breast ca  . Psoriasis      Objective/Physical Exam General: The patient is alert and oriented x3 in no acute distress.  Dermatology:  Wound noted to the 3rd toe of the left foot has healed. Complete re-epithelialization has occurred. No drainage noted. Skin is warm, dry and supple bilateral lower extremities.  Vascular: Palpable pedal pulses bilaterally. Mild edema noted. Capillary refill within normal limits. Varicosities noted bilateral lower extremities.   Neurological: Epicritic and protective threshold absent bilaterally.   Musculoskeletal Exam: Range of motion within normal limits to all pedal and ankle joints bilateral. Muscle strength 5/5 in all groups bilateral.   Assessment: #1 ulceration of the 3rd left toe secondary to venous insufficiency - resolved #2 varicosities bilateral lower extremities  Plan of Care:  #1 Patient was evaluated. #2 Light debridement of the area  performed with a tissue nipper.  #3 Recommended antibiotic ointment to be applied daily for one week with a Band-Aid.  #4 Continue using toe crest pads.  #5 Return to clinic as needed.    Edrick Kins, DPM [facility]  Dr. Edrick Kins, [address]                                        [place], [place] [address]                Office [phone]  Fax [phone]

## 2017-10-21 NOTE — Therapy (Signed)
[facility] [address] [place], [address] Phone: [phone]   Fax:  [phone]  Physical Therapy Treatment  Patient Details  Name: [name] MRN: [id number] Date of Birth: Feb 27, 1941 Referring Provider: Caryl Bis MD   Encounter Date: 10/21/2017  PT End of Session - 10/21/17 1500    Visit Number  19    Number of Visits  24    Date for PT Re-Evaluation  10/28/17    PT Start Time  1430    PT Stop Time  1515    PT Time Calculation (min)  45 min    Equipment Utilized During Treatment  Gait belt    Activity Tolerance  Patient tolerated treatment well    Behavior During Therapy  [facility] for tasks assessed/performed       Past Medical History:  Diagnosis Date  . Breast cancer ([place]) 2001   left breast  . Cancer ([place]) 2001   left breast ca  . Dysrhythmia   . GERD (gastroesophageal reflux disease)   . Hyperlipidemia   . Hypertension   . Osteoarthritis, multiple sites   . Osteoarthrosis involving, or with mention of more than one site, but not specified as generalized, multiple sites   . Osteopenia   . Personal history of radiation therapy 2001   left breast ca  . Psoriasis     Past Surgical History:  Procedure Laterality Date  . ABDOMINAL HYSTERECTOMY    . ANKLE FRACTURE SURGERY  4/08   left---hardware still in place  . 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 BREAST EXCISIONAL BIOPSY Left yrs ago   benign  . BREAST LUMPECTOMY  2001   left  . CARDIOVERSION N/A 10/04/2017   Procedure: CARDIOVERSION;  Surgeon: Wellington Hampshire, MD;  Location: [facility] ORS;  Service: Cardiovascular;  Laterality: N/A;  . CAROTID ENDARTERECTOMY Left 10/23/2004  . FRACTURE SURGERY    . SHOULDER SURGERY  6/07   left  . TONSILLECTOMY AND ADENOIDECTOMY    . TOTAL HIP ARTHROPLASTY  2004   right    There were no vitals filed for this visit.  Subjective Assessment - 10/21/17 1441    Subjective  Patient reports  she has been performing ADLs and continues to try to figure out the required documents after her husband passing away.     Pertinent History  Patient previously seen for balance difficulties and shoulder pain and the right.  Patient reports no falls in the past six months. Patient reports pain has not improved for the past month and has been about the same since the onset of pain.     Limitations  Walking    Patient Stated Goals  To improve balance when walking     Currently in Pain?  No/denies        TREATMENT:   Therapeutic Exercise:   Nustep step level 5 - x5 min; with use of arms and legs to improve strengthening  Sit to stands with TRX straps - 2x 10  Standing feet together left/right - 2 min Trampoline standing balance - x44min Mini marches performed on trampoline - 2 x 15  Marches on large dynadisc - x 20 B Tandem ambulation without UE support - 3 x 59ft   Pt demonstrates increased fatigue with exercise at end of session.    PT Education - 10/21/17 1500    Education provided  Yes    Education Details  form/technique with exercise  Person(s) Educated  Patient    Methods  Explanation;Demonstration    Comprehension  Verbalized understanding;Returned demonstration          PT Long Term Goals - 09/30/17 0923      PT LONG TERM GOAL #1   Title  Patient will be independent with HEP to continue benefits of therapy after discharge.     Baseline  Dependent with form and technique for balancing exercises; moderate cueing for balance and strengthening    Time  4    Period  Weeks    Status  On-going      PT LONG TERM GOAL #2   Title  Patient will improve FGA to over 26/30 to indicate functional improvement in strength and balance limitations and decrease fall risk    Baseline  22/30; ; 09/01/2017: 24/30; 09/30/2017: 25/30    Time  8    Period  Weeks    Status  On-going      PT LONG TERM GOAL #3   Title  Patient will improve 5xSTS to under 16 seconds without use of a  pillow to indicate functional improvement of LE function and decreased fall risk    Baseline  19 sec with pillow under chair; 09/01/2017 12 seconds with use of a pillow; 09/30/2017: 10.9 sec with use of pillow     Time  8    Period  Weeks    Status  On-going      PT LONG TERM GOAL #4   Title  Patient will be able to balance for 10sec with SLS to improve static balance and ability to get dressed when in standing    Baseline  1 sec; 09/01/2017: 3 sec each leg; 09/30/17: 4.5    Time  8    Period  Weeks    Status  On-going            Plan - 10/21/17 1502    Clinical Impression Statement  Patient demonstrates improvement with static balance exercises today and requires less UE support to perform exercises with less postural sway indicating functional carryover between sessions. Patient demonstrates decreased dynamic balance with exercise and decreased muscular endurance with exercise. Patient will benefit from further skilled therapy to return to prior level of function.     Rehab Potential  Good    Clinical Impairments Affecting Rehab Potential  Positive: motivation; Negative: , age    PT Frequency  2x / week    PT Duration  6 weeks    PT Treatment/Interventions  ADLs/Self Care Home Management;Aquatic Therapy;Biofeedback;DME Instruction;Gait training;Stair training;Functional mobility training;Therapeutic activities;Therapeutic exercise;Balance training;Neuromuscular re-education;Patient/family education;Passive range of motion;Energy conservation;Ultrasound;Moist Heat;Iontophoresis 4mg /ml Dexamethasone;Cryotherapy;Electrical Stimulation;Manual techniques;Dry needling    PT Next Visit Plan  Progress balance and strengthening exercises,     PT Home Exercise Plan  See education section    Consulted and Agree with Plan of Care  Patient       Patient will benefit from skilled therapeutic intervention in order to improve the following deficits and impairments:  Pain, Increased fascial  restricitons, Decreased coordination, Decreased mobility, Decreased activity tolerance, Decreased endurance, Decreased range of motion, Decreased strength, Hypomobility, Postural dysfunction, Abnormal gait, Decreased balance, Difficulty walking  Visit Diagnosis: Unsteadiness on feet  History of falling  Muscle weakness (generalized)     Problem List Patient Active Problem List   Diagnosis Date Noted  . Hypomagnesemia 09/29/2017  . Hypokalemia 09/29/2017  . Atrial fibrillation ([place]) 09/10/2017  . Trapezius strain, right, initial encounter 09/10/2017  .  Depression, major, single episode, mild ([place]) 09/10/2017  . Chronic venous insufficiency 09/09/2017  . Lymphedema 09/09/2017  . Pain of toe of left foot 08/17/2017  . Grief 08/17/2017  . Epistaxis 07/31/2017  . Leg swelling 07/31/2017  . Dry eyes 07/06/2017  . Pain and swelling of lower extremity, right 03/29/2017  . Right shoulder pain 03/29/2017  . Diarrhea 05/18/2016  . Fall 05/01/2016  . Unintentional weight loss 12/30/2015  . Leg weakness, bilateral 12/30/2015  . Hyperkalemia 01/01/2015  . Occasional numbness/prickling/tingling of fingers and toes 12/18/2014  . Routine general medical examination at a health care facility 12/13/2013  . Macrocytic anemia 05/19/2013  . 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 Psoriasis   . Osteoarthritis, multiple sites   . Episodic mood disorder ([place]) 12/01/2011  . History of breast cancer 10/28/2011  . GERD (gastroesophageal reflux disease)   . Hyperlipidemia   . Hypertension   . Osteopenia   . Carotid stenosis 05/27/2011    Blythe Stanford, PT DPT 10/21/2017, 3:16 PM  [facility] [address], [place], [address] Phone: [phone]   Fax:  [phone]  Name: [name] MRN: [id number] Date of Birth: 09-23-1940

## 2017-10-25 ENCOUNTER — Ambulatory Visit: Payer: Medicare Other

## 2017-10-25 DIAGNOSIS — Z9181 History of falling: Secondary | ICD-10-CM

## 2017-10-25 DIAGNOSIS — M6281 Muscle weakness (generalized): Secondary | ICD-10-CM

## 2017-10-25 DIAGNOSIS — R2681 Unsteadiness on feet: Secondary | ICD-10-CM

## 2017-10-25 NOTE — Therapy (Signed)
[facility] [address], [place], [address] Phone: [phone]   Fax:  [phone]  Physical Therapy Treatment  Patient Details  Name: [name] MRN: [id number] Date of Birth: [birth year] Referring Provider: Caryl Bis MD   Encounter Date: 10/25/2017  PT End of Session - 10/25/17 1140    Visit Number  20    Number of Visits  24    Date for PT Re-Evaluation  10/28/17    PT Start Time  1117    PT Stop Time  1150    PT Time Calculation (min)  33 min    Equipment Utilized During Treatment  Gait belt    Activity Tolerance  Patient tolerated treatment well    Behavior During Therapy  [facility] for tasks assessed/performed       Past Medical History:  Diagnosis Date  . Breast cancer ([place]) 2001   left breast  . Cancer ([place]) 2001   left breast ca  . Dysrhythmia   . GERD (gastroesophageal reflux disease)   . Hyperlipidemia   . Hypertension   . Osteoarthritis, multiple sites   . Osteoarthrosis involving, or with mention of more than one site, but not specified as generalized, multiple sites   . Osteopenia   . Personal history of radiation therapy 2001   left breast ca  . Psoriasis     Past Surgical History:  Procedure Laterality Date  . ABDOMINAL HYSTERECTOMY    . ANKLE FRACTURE SURGERY  4/08   left---hardware still in place  . 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 BREAST EXCISIONAL BIOPSY Left yrs ago   benign  . BREAST LUMPECTOMY  2001   left  . CARDIOVERSION N/A 10/04/2017   Procedure: CARDIOVERSION;  Surgeon: Wellington Hampshire, MD;  Location: [facility] ORS;  Service: Cardiovascular;  Laterality: N/A;  . CAROTID ENDARTERECTOMY Left 10/23/2004  . FRACTURE SURGERY    . SHOULDER SURGERY  6/07   left  . TONSILLECTOMY AND ADENOIDECTOMY    . TOTAL HIP ARTHROPLASTY  2004   right    There were no vitals filed for this visit.  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     Pertinent History  Patient previously seen for balance difficulties and shoulder pain and the right.  Patient reports no falls in the past six months. Patient reports pain has not improved for the past month and has been about the same since the onset of pain.     Limitations  Walking    How long can you sit comfortably?       Patient Stated Goals  To improve balance when walking     Currently in Pain?  No/denies       TREATMENT:   Therapeutic Exercise:   Nustep step level 5 - x5 min; with use of arms and legs to improve strengthening  Feet together forward/backward on half foam - x 5 min Step ups onto 6" step with unilateral UE support - x 20 B Static step ups onto 8" step with unilateral support - x 20 B Sit to stands with TRX straps - 2x 10  Side Stepping across Balance stones - x 10 without UE support Hip abduction with stance foot on balance stone - 2 x 10    Pt demonstrates increased fatigue with exercise  at end of session.   PT Education - 10/25/17 1138    Education provided  Yes    Education Details  form/technique with exercise    Person(s) Educated  Patient    Methods  Explanation;Demonstration    Comprehension  Returned demonstration;Verbalized understanding          PT Long Term Goals - 09/30/17 0923      PT LONG TERM GOAL #1   Title  Patient will be independent with HEP to continue benefits of therapy after discharge.     Baseline  Dependent with form and technique for balancing exercises; moderate cueing for balance and strengthening    Time  4    Period  Weeks    Status  On-going      PT LONG TERM GOAL #2   Title  Patient will improve FGA to over 26/30 to indicate functional improvement in strength and balance limitations and decrease fall risk    Baseline  22/30; ; 09/01/2017: 24/30; 09/30/2017: 25/30    Time  8    Period  Weeks    Status  On-going      PT  LONG TERM GOAL #3   Title  Patient will improve 5xSTS to under 16 seconds without use of a pillow to indicate functional improvement of LE function and decreased fall risk    Baseline  19 sec with pillow under chair; 09/01/2017 12 seconds with use of a pillow; 09/30/2017: 10.9 sec with use of pillow     Time  8    Period  Weeks    Status  On-going      PT LONG TERM GOAL #4   Title  Patient will be able to balance for 10sec with SLS to improve static balance and ability to get dressed when in standing    Baseline  1 sec; 09/01/2017: 3 sec each leg; 09/30/17: 4.5    Time  8    Period  Weeks    Status  On-going            Plan - 10/25/17 1147    Clinical Impression Statement  Focused on performing greater amount of resistance exercise today secondary to improve ability to perform functional activities such as walking and performing transfers with greater amount of ease. Patient demonstrates improvement with sit to stands with TRX indicating functional improvement with glute and quadriceps strength. Patient will benefit from further skilled therapy to return to prior level of function.     Rehab Potential  Good    Clinical Impairments Affecting Rehab Potential  Positive: motivation; Negative: , age    PT Frequency  2x / week    PT Duration  6 weeks    PT Treatment/Interventions  ADLs/Self Care Home Management;Aquatic Therapy;Biofeedback;DME Instruction;Gait training;Stair training;Functional mobility training;Therapeutic activities;Therapeutic exercise;Balance training;Neuromuscular re-education;Patient/family education;Passive range of motion;Energy conservation;Ultrasound;Moist Heat;Iontophoresis 4mg /ml Dexamethasone;Cryotherapy;Electrical Stimulation;Manual techniques;Dry needling    PT Next Visit Plan  Progress balance and strengthening exercises,     PT Home Exercise Plan  See education section    Consulted and Agree with Plan of Care  Patient       Patient will benefit from skilled  therapeutic intervention in order to improve the following deficits and impairments:  Pain, Increased fascial restricitons, Decreased coordination, Decreased mobility, Decreased activity tolerance, Decreased endurance, Decreased range of motion, Decreased strength, Hypomobility, Postural dysfunction, Abnormal gait, Decreased balance, Difficulty walking  Visit Diagnosis: Unsteadiness on feet  Muscle weakness (generalized)  History of falling  Problem List Patient Active Problem List   Diagnosis Date Noted  . Hypomagnesemia 09/29/2017  . Hypokalemia 09/29/2017  . Atrial fibrillation ([place]) 09/10/2017  . Trapezius strain, right, initial encounter 09/10/2017  . Depression, major, single episode, mild ([place]) 09/10/2017  . Chronic venous insufficiency 09/09/2017  . Lymphedema 09/09/2017  . Pain of toe of left foot 08/17/2017  . Grief 08/17/2017  . Epistaxis 07/31/2017  . Leg swelling 07/31/2017  . Dry eyes 07/06/2017  . Pain and swelling of lower extremity, right 03/29/2017  . Right shoulder pain 03/29/2017  . Diarrhea 05/18/2016  . Fall 05/01/2016  . Unintentional weight loss 12/30/2015  . Leg weakness, bilateral 12/30/2015  . Hyperkalemia 01/01/2015  . Occasional numbness/prickling/tingling of fingers and toes 12/18/2014  . Routine general medical examination at a health care facility 12/13/2013  . Macrocytic anemia 05/19/2013  . 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 Psoriasis   . Osteoarthritis, multiple sites   . Episodic mood disorder ([place]) 12/01/2011  . History of breast cancer 10/28/2011  . GERD (gastroesophageal reflux disease)   . Hyperlipidemia   . Hypertension   . Osteopenia   . Carotid stenosis 05/27/2011    Blythe Stanford, PT DPT 10/25/2017, 11:57 AM  [facility] [address], [place], [address] Phone: [phone]   Fax:  [phone]  Name: [name] MRN: [id number] Date  of Birth: 16-Jun-1941

## 2017-10-27 ENCOUNTER — Ambulatory Visit: Payer: Medicare Other

## 2017-10-27 DIAGNOSIS — M6281 Muscle weakness (generalized): Secondary | ICD-10-CM | POA: Diagnosis not present

## 2017-10-27 DIAGNOSIS — Z9181 History of falling: Secondary | ICD-10-CM

## 2017-10-27 DIAGNOSIS — R2681 Unsteadiness on feet: Secondary | ICD-10-CM | POA: Diagnosis not present

## 2017-10-27 NOTE — Therapy (Signed)
[facility] [address] [place], [address] Phone: [phone]   Fax:  [phone]  Physical Therapy Treatment  Patient Details  Name: [name] MRN: [id number] Date of Birth: 1941-02-16 Referring Provider: Caryl Bis MD   Encounter Date: 10/27/2017  PT End of Session - 10/27/17 1032    Visit Number  21    Number of Visits  24    Date for PT Re-Evaluation  10/28/17    PT Start Time  1000    PT Stop Time  1045    PT Time Calculation (min)  45 min    Equipment Utilized During Treatment  Gait belt    Activity Tolerance  Patient tolerated treatment well    Behavior During Therapy  [facility] for tasks assessed/performed       Past Medical History:  Diagnosis Date  . Breast cancer ([place]) 2001   left breast  . Cancer ([place]) 2001   left breast ca  . Dysrhythmia   . GERD (gastroesophageal reflux disease)   . Hyperlipidemia   . Hypertension   . Osteoarthritis, multiple sites   . Osteoarthrosis involving, or with mention of more than one site, but not specified as generalized, multiple sites   . Osteopenia   . Personal history of radiation therapy 2001   left breast ca  . Psoriasis     Past Surgical History:  Procedure Laterality Date  . ABDOMINAL HYSTERECTOMY    . ANKLE FRACTURE SURGERY  4/08   left---hardware still in place  . 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 BREAST EXCISIONAL BIOPSY Left yrs ago   benign  . BREAST LUMPECTOMY  2001   left  . CARDIOVERSION N/A 10/04/2017   Procedure: CARDIOVERSION;  Surgeon: Wellington Hampshire, MD;  Location: [facility] ORS;  Service: Cardiovascular;  Laterality: N/A;  . CAROTID ENDARTERECTOMY Left 10/23/2004  . FRACTURE SURGERY    . SHOULDER SURGERY  6/07   left  . TONSILLECTOMY AND ADENOIDECTOMY    . TOTAL HIP ARTHROPLASTY  2004   right    There were no vitals filed for this visit.  Subjective Assessment - 10/27/17 1028    Subjective  Patient reports  she has been feeling alright and reports she has a MD appointment with her cardiologist tomorrow. Patient reports no cardiovascular symptoms.     Pertinent History  Patient previously seen for balance difficulties and shoulder pain and the right.  Patient reports no falls in the past six months. Patient reports pain has not improved for the past month and has been about the same since the onset of pain.     Limitations  Walking    How long can you sit comfortably?       Patient Stated Goals  To improve balance when walking     Currently in Pain?  No/denies          STEVEN TREATMENT:   Therapeutic Exercise: Nustep step level 5 - x5 min; with use of arms and legs to improve strengthening  Hip abduction with stance foot on balance stone - 2 x 10  Tandem ambulation on airex beam - x 10 unilateral Feet together forward/backward on wobbleboard - x 2 min; side to side - x 48min Sit to stands with TRX straps - 2x 10  Side stepping up and over 6" step - x 20 Hip extension with YTB in standing - 2 x 10    Pt demonstrates  increased fatigue with exercise at end of session.       PT Education - 10/27/17 1030    Education provided  Yes    Education Details  form/technique with exercise    Person(s) Educated  Patient    Methods  Explanation;Demonstration    Comprehension  Verbalized understanding;Returned demonstration          PT Long Term Goals - 09/30/17 0923      PT LONG TERM GOAL #1   Title  Patient will be independent with HEP to continue benefits of therapy after discharge.     Baseline  Dependent with form and technique for balancing exercises; moderate cueing for balance and strengthening    Time  4    Period  Weeks    Status  On-going      PT LONG TERM GOAL #2   Title  Patient will improve FGA to over 26/30 to indicate functional improvement in strength and balance limitations and decrease fall risk    Baseline  22/30; ; 09/01/2017: 24/30; 09/30/2017: 25/30    Time  8     Period  Weeks    Status  On-going      PT LONG TERM GOAL #3   Title  Patient will improve 5xSTS to under 16 seconds without use of a pillow to indicate functional improvement of LE function and decreased fall risk    Baseline  19 sec with pillow under chair; 09/01/2017 12 seconds with use of a pillow; 09/30/2017: 10.9 sec with use of pillow     Time  8    Period  Weeks    Status  On-going      PT LONG TERM GOAL #4   Title  Patient will be able to balance for 10sec with SLS to improve static balance and ability to get dressed when in standing    Baseline  1 sec; 09/01/2017: 3 sec each leg; 09/30/17: 4.5    Time  8    Period  Weeks    Status  On-going            Plan - 10/27/17 1036    Clinical Impression Statement  Continued to focus on improving hip strengthening and balance with exercises as patient has increased imbalance with performing dynamic balance activities. Patient demonstrates improvement with exercises requiring less UE support to perform exercises indicating improvement in balance and posture. Patient will benefit from further skilled therapy focused on improving limitations to return to prior level of function.     Rehab Potential  Good    Clinical Impairments Affecting Rehab Potential  Positive: motivation; Negative: , age    PT Frequency  2x / week    PT Duration  6 weeks    PT Treatment/Interventions  ADLs/Self Care Home Management;Aquatic Therapy;Biofeedback;DME Instruction;Gait training;Stair training;Functional mobility training;Therapeutic activities;Therapeutic exercise;Balance training;Neuromuscular re-education;Patient/family education;Passive range of motion;Energy conservation;Ultrasound;Moist Heat;Iontophoresis 4mg /ml Dexamethasone;Cryotherapy;Electrical Stimulation;Manual techniques;Dry needling    PT Next Visit Plan  Progress balance and strengthening exercises,     PT Home Exercise Plan  See education section    Consulted and Agree with Plan of Care  Patient        Patient will benefit from skilled therapeutic intervention in order to improve the following deficits and impairments:  Pain, Increased fascial restricitons, Decreased coordination, Decreased mobility, Decreased activity tolerance, Decreased endurance, Decreased range of motion, Decreased strength, Hypomobility, Postural dysfunction, Abnormal gait, Decreased balance, Difficulty walking  Visit Diagnosis: Unsteadiness on feet  Muscle weakness (generalized)  History of falling     Problem List Patient Active Problem List   Diagnosis Date Noted  . Hypomagnesemia 09/29/2017  . Hypokalemia 09/29/2017  . Atrial fibrillation ([employer]) 09/10/2017  . Trapezius strain, right, initial encounter 09/10/2017  . Depression, major, single episode, mild ([place]) 09/10/2017  . Chronic venous insufficiency 09/09/2017  . Lymphedema 09/09/2017  . Pain of toe of left foot 08/17/2017  . Grief 08/17/2017  . Epistaxis 07/31/2017  . Leg swelling 07/31/2017  . Dry eyes 07/06/2017  . Pain and swelling of lower extremity, right 03/29/2017  . Right shoulder pain 03/29/2017  . Diarrhea 05/18/2016  . Fall 05/01/2016  . Unintentional weight loss 12/30/2015  . Leg weakness, bilateral 12/30/2015  . Hyperkalemia 01/01/2015  . Occasional numbness/prickling/tingling of fingers and toes 12/18/2014  . Routine general medical examination at a health care facility 12/13/2013  . Macrocytic anemia 05/19/2013  . Psoriatic arthritis ([place]) 02/03/2013  . Psoriasis   . Osteoarthritis, multiple sites   . Episodic mood disorder ([place]) 12/01/2011  . History of breast cancer 10/28/2011  . GERD (gastroesophageal reflux disease)   . Hyperlipidemia   . Hypertension   . Osteopenia   . Carotid stenosis 05/27/2011    Blythe Stanford, PT DPT 10/27/2017, 10:42 AM  [facility] [address] [place], [address] Phone: [phone]   Fax:   [phone]  Name: [name] MRN: [id number] Date of Birth: 1940-07-25

## 2017-10-28 ENCOUNTER — Encounter: Payer: Self-pay | Admitting: Nurse Practitioner

## 2017-10-28 ENCOUNTER — Ambulatory Visit (INDEPENDENT_AMBULATORY_CARE_PROVIDER_SITE_OTHER): Payer: Medicare Other | Admitting: Nurse Practitioner

## 2017-10-28 VITALS — BP 112/80 | HR 101 | Ht 66.5 in | Wt 121.0 lb

## 2017-10-28 DIAGNOSIS — I5032 Chronic diastolic (congestive) heart failure: Secondary | ICD-10-CM

## 2017-10-28 DIAGNOSIS — I480 Paroxysmal atrial fibrillation: Secondary | ICD-10-CM

## 2017-10-28 MED ORDER — METOPROLOL TARTRATE 50 MG PO TABS: 50.0000 mg | ORAL_TABLET | Freq: Two times a day (BID) | ORAL | Status: DC

## 2017-10-28 MED ORDER — METOPROLOL TARTRATE 50 MG PO TABS: 50.0000 mg | ORAL_TABLET | Freq: Two times a day (BID) | ORAL | 3 refills | Status: DC

## 2017-10-28 NOTE — Patient Instructions (Signed)
Medication Instructions: - Your physician has recommended you make the following change in your medication:   1) INCREASE lopressor (metoprolol tartrate) 50 mg- take 1 tablet (50 mg) by mouth TWICE daily  Labwork: - none ordered  Procedures/Testing: - none ordered  Follow-Up: - Your physician recommends that you schedule a follow-up appointment in: 1 month with Dr. Caryl Comes.   Any Additional Special Instructions Will Be Listed Below (If Applicable).     If you need a refill on your cardiac medications before your next appointment, please call your pharmacy.

## 2017-10-28 NOTE — Progress Notes (Signed)
Office Visit    Patient Name: [name] Date of Encounter: 10/28/2017  Primary Care Provider:  [name], MD Primary Cardiologist:  Virl Axe, MD  Chief Complaint    77 year old female with a history of persistent atrial fibrillation, HFpEF, left breast cancer, carotid arterial disease, GERD, hypertension, and hyperlipidemia, who presents for follow-up after recent cardioversion.  Past Medical History    Past Medical History:  Diagnosis Date  . (HFpEF) heart failure with preserved ejection fraction ([place])    a. 08/2017 Echo: EF 55-60%, no rwma, mild MR, mildly dil LA, nl RV fxn.  . Breast cancer ([place]) 2001   left breast  . Cancer ([place]) 2001   left breast ca  . Carotid arterial disease ([place])    a. 10/2004 s/p L CEA; b. 12/2015 Carotid U/S: RICA 1-39%; b. LICA patent CEA site.  Marland Kitchen GERD (gastroesophageal reflux disease)   . Hyperlipidemia   . Hypertension   . Osteoarthritis, multiple sites   . Osteopenia   . Persistent atrial fibrillation ([place])    a. Dx 08/2017; b. CHA2DS2VASc = 6-->Xarelto; c. 09/2017 Successful DCCV (second shock - 200J);   Marland Kitchen Personal history of radiation therapy 2001   left breast ca  . Psoriasis    Past Surgical History:  Procedure Laterality Date  . ABDOMINAL HYSTERECTOMY    . ANKLE FRACTURE SURGERY  4/08   left---hardware still in place  . 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 BREAST EXCISIONAL BIOPSY Left yrs ago   benign  . BREAST LUMPECTOMY  2001   left  . CARDIOVERSION N/A 10/04/2017   Procedure: CARDIOVERSION;  Surgeon: Wellington Hampshire, MD;  Location: [facility] ORS;  Service: Cardiovascular;  Laterality: N/A;  . CAROTID ENDARTERECTOMY Left 10/23/2004  . FRACTURE SURGERY    . SHOULDER SURGERY  6/07   left  . TONSILLECTOMY AND ADENOIDECTOMY    . TOTAL HIP ARTHROPLASTY  2004   right    Allergies  No Known Allergies  History of Present Illness    77 year old female with the above past medical history including  hypertension, hyperlipidemia, carotid arterial disease status post left carotid endarterectomy, left breast cancer, arthritis, and osteopenia.  In February of this year, she was admitted to [facility] with progressive lower extremity swelling and dyspnea for several months.  She was found to be in rapid atrial fibrillation.  She was placed on beta-blocker and diltiazem therapy and also initiated on Xarelto.  Echocardiogram showed normal LV function.  She was able to be reasonably rate controlled and also diuresed.  She subsequently followed up in clinic and was doing reasonably well.  She has chronic fatigue and at that time, also reported dyspnea and had persistent lower extremity swelling.  Recommendation was made for cardioversion after 3 weeks of anticoagulation.  She subsequently underwent cardioversion on March 18 and this was successful after 2 shocks.  She says that she did not notice any difference in her level of fatigue or exercise intolerance following cardioversion.  She has not had any chest pain or palpitations.  She is back in atrial fibrillation today.  She was not aware of this.  She denies PND, orthopnea, dizziness, syncope, edema, or early satiety.  Home Medications    Prior to Admission medications   Medication Sig Start Date End Date Taking? Authorizing Provider  Cholecalciferol (VITAMIN D3) 1000 units CAPS Take 1 capsule by mouth 2 (two) times daily.    Yes [provider]  diltiazem (CARDIZEM CD) 120 MG 24 hr capsule Take 1 capsule (120 mg total) by mouth daily. 09/16/17 12/15/17 Yes [name], MD  furosemide (LASIX) 40 MG tablet Take 1 tablet (40 mg total) by mouth daily. 10/08/17  Yes [name], MD  LORazepam (ATIVAN) 0.5 MG tablet TAKE 1 TABLET BY MOUTH 2 TIMES DAILY AS NEEDED FOR ANXIETY 06/25/17  Yes [name], MD  metoprolol tartrate (LOPRESSOR) 50 MG tablet Take 0.5 tablets (25 mg total) by mouth 2 (two) times daily. 08/28/17  Yes [name], MD  mirtazapine (REMERON) 15 MG tablet TAKE 1 TABLET (15 MG TOTAL) BY MOUTH AT BEDTIME. 10/20/17  Yes [name], MD  Multiple Vitamin (MULTIVITAMIN) capsule Take 1 capsule by mouth daily.     Yes [provider]  Multiple Vitamins-Minerals (PRESERVISION AREDS 2) CAPS Take 1 tablet by mouth 2 (two) times daily.   Yes [provider]  Rivaroxaban (XARELTO) 15 MG TABS tablet Take 1 tablet (15 mg total) by mouth daily with supper. 10/08/17  Yes [name], MD    Review of Systems    She has chronic fatigue and just notes low energy.  She often just has no get up and go to do anything and says this is been present for quite some time.  She is not sure that it ever got better after cardioversion she denies dyspnea on exertion but does tire out easily and has poor exercise tolerance.  All other systems reviewed and are otherwise negative except as noted above.  Physical Exam    VS:  BP 112/80 (BP Location: Left Arm, Patient Position: Sitting, Cuff Size: Normal)   Pulse (!) 101   Ht 5' 6.5" (1.689 m)   Wt 121 lb (54.9 kg)   BMI 19.24 kg/m  , BMI Body mass index is 19.24 kg/m. GEN: Well nourished, well developed, in no acute distress.  HEENT: normal.  Neck: Supple, no JVD, carotid bruits, or masses. Cardiac: Irregularly irregular, no murmurs, rubs, or gallops. No clubbing, cyanosis, edema.  Radials/DP/PT 2+ and equal bilaterally.  Respiratory:  Respirations regular and unlabored, clear to auscultation bilaterally. GI: Soft, nontender, nondistended, BS + x 4. MS: no deformity or atrophy. Skin: warm and dry, no rash. Neuro:  Strength and sensation are intact. Psych: Normal affect.  Accessory Clinical Findings    ECG - atrial fibrillation, 101, prior septal infarct, no acute ST or T changes.  Assessment & Plan    1.  Persistent atrial fibrillation: Patient diagnosed with A. fib in the setting of dyspnea and heart failure and February.  Following  adequate diuresis and anticoagulation, she underwent successful cardioversion in late March.  She has chronic fatigue and she is not sure if it changed at all after cardioversion.  She is back in atrial fibrillation on ECG today.  She denies palpitations and was not aware that she has been in A. fib.  She has been compliant with diltiazem, metoprolol, and Xarelto.  We discussed options for management today including initiation of antiarrhythmic therapy with plan for repeat cardioversion after appropriate loading versus additional rate control and titration of metoprolol with plan for follow-up and reevaluation of symptoms in a few weeks.  She prefers the latter, more conservative approach for the time being.  As such, I will increase her metoprolol to 50 mg twice daily.  She will otherwise remain on diltiazem and Xarelto.  I have asked her to pay close attention to her symptoms  as it is unclear based on her description, to what extent A. fib is playing a role in her chronic fatigue.  If she notes worsening dyspnea, heart failure symptoms or fatigue, to pursue she would be willing antiarrhythmic therapy.  2.  HFpEF: Volume is stable today.  We will work on heart rate control with titration of beta-blocker.  Blood pressure stable.  3.  Carotid arterial disease: Followed by vascular surgery.  Stable by last ultrasound in 2017.  Interestingly, she is not on a statin but LDL was only 60 in February 2019.  4.  Disposition:  Titrating beta-blocker.  Follow-up in clinic in 3-4 weeks.  Murray Hodgkins, NP 10/28/2017, 11:00 AM

## 2017-11-01 ENCOUNTER — Ambulatory Visit: Payer: Medicare Other

## 2017-11-03 ENCOUNTER — Ambulatory Visit: Payer: Medicare Other

## 2017-11-03 DIAGNOSIS — R2681 Unsteadiness on feet: Secondary | ICD-10-CM

## 2017-11-03 DIAGNOSIS — M6281 Muscle weakness (generalized): Secondary | ICD-10-CM | POA: Diagnosis not present

## 2017-11-03 DIAGNOSIS — Z9181 History of falling: Secondary | ICD-10-CM

## 2017-11-03 NOTE — Therapy (Signed)
[facility] [address] [place], [address] Phone: [phone]   Fax:  [phone]  Physical Therapy Treatment Physical Therapy Progress Note   Dates of reporting period  09/30/2017   to   11/03/2017  Patient Details  Name: [name] MRN: [id number] Date of Birth: [birth year] Referring Provider: Caryl Bis MD   Encounter Date: 11/03/2017  PT End of Session - 11/03/17 1045    Visit Number  22    Number of Visits  24    Date for PT Re-Evaluation  12/15/17    Authorization Type  Progress note today    PT Start Time  1000    PT Stop Time  1040    PT Time Calculation (min)  40 min    Equipment Utilized During Treatment  Gait belt    Activity Tolerance  Patient tolerated treatment well    Behavior During Therapy  [facility] for tasks assessed/performed       Past Medical History:  Diagnosis Date  . (HFpEF) heart failure with preserved ejection fraction ([place])    a. 08/2017 Echo: EF 55-60%, no rwma, mild MR, mildly dil LA, nl RV fxn.  . Breast cancer ([place]) 2001   left breast  . Cancer ([place]) 2001   left breast ca  . Carotid arterial disease ([place])    a. 10/2004 s/p L CEA; b. 12/2015 Carotid U/S: RICA 1-39%; b. LICA patent CEA site.  Marland Kitchen GERD (gastroesophageal reflux disease)   . Hyperlipidemia   . Hypertension   . Osteoarthritis, multiple sites   . Osteopenia   . Persistent atrial fibrillation ([place])    a. Dx 08/2017; b. CHA2DS2VASc = 6-->Xarelto; c. 09/2017 Successful DCCV (second shock - 200J);   Marland Kitchen Personal history of radiation therapy 2001   left breast ca  . Psoriasis     Past Surgical History:  Procedure Laterality Date  . ABDOMINAL HYSTERECTOMY    . ANKLE FRACTURE SURGERY  4/08   left---hardware still in place  . 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 BREAST EXCISIONAL BIOPSY Left yrs ago   benign  . BREAST LUMPECTOMY  2001   left  . CARDIOVERSION N/A 10/04/2017   Procedure: CARDIOVERSION;   Surgeon: Wellington Hampshire, MD;  Location: [facility] ORS;  Service: Cardiovascular;  Laterality: N/A;  . CAROTID ENDARTERECTOMY Left 10/23/2004  . FRACTURE SURGERY    . SHOULDER SURGERY  6/07   left  . TONSILLECTOMY AND ADENOIDECTOMY    . TOTAL HIP ARTHROPLASTY  2004   right    There were no vitals filed for this visit.  Subjective Assessment - 11/03/17 1031    Subjective  Patient reports she saw her heart doctor and reports the doctor said she is still in a fib. Patient reports no other major changes otherwise.     Pertinent History  Patient previously seen for balance difficulties and shoulder pain and the right.  Patient reports no falls in the past six months. Patient reports pain has not improved for the past month and has been about the same since the onset of pain.     Limitations  Walking    How long can you sit comfortably?       Patient Stated Goals  To improve balance when walking     Currently in Pain?  No/denies       TREATMENT:   Therapeutic Exercise: Nustep step level 5 - x5 min; with use of  arms and legs to improve strengthening  Step ups onto 6" step - x15 Standing feet together on large wobbleboard - x 60sec x 3  Wooden wobble board left/right in standing - x 20 ;; laterally and forward/backward Sit to stands with TRX straps - 2x 10  Tandem Ambulation - 59ft x 5  Single leg stance at treadmill with UE support - x 20    Pt demonstrates increased fatigue with exercise at end of session.    PT Education - 11/03/17 1039    Education provided  Yes    Education Details  form/technique with exercise     Person(s) Educated  Patient    Methods  Explanation;Demonstration    Comprehension  Verbalized understanding;Returned demonstration          PT Long Term Goals - 11/03/17 1046      PT LONG TERM GOAL #1   Title  Patient will be independent with HEP to continue benefits of therapy after discharge.     Baseline  Dependent with form and technique for balancing  exercises; moderate cueing for balance and strengthening    Time  4    Period  Weeks    Status  On-going      PT LONG TERM GOAL #2   Title  Patient will improve FGA to over 26/30 to indicate functional improvement in strength and balance limitations and decrease fall risk    Baseline  22/30; ; 09/01/2017: 24/30; 09/30/2017: 25/30; 11/03/2017: 25/30    Time  8    Period  Weeks    Status  On-going      PT LONG TERM GOAL #3   Title  Patient will improve 5xSTS to under 16 seconds without use of a pillow to indicate functional improvement of LE function and decreased fall risk    Baseline  19 sec with pillow under chair; 09/01/2017 12 seconds with use of a pillow; 09/30/2017: 10.9 sec with use of pillow; defered today secondary to increased soreness from walking the previous day    Time  8    Period  Weeks    Status  On-going      PT LONG TERM GOAL #4   Title  Patient will be able to balance for 10sec with SLS to improve static balance and ability to get dressed when in standing    Baseline  1 sec; 09/01/2017: 3 sec each leg; 09/30/17: 4.5; 11/03/2017: 5 sec    Time  8    Period  Weeks    Status  On-going            Plan - 11/03/17 1049    Clinical Impression Statement  Patient has been making progress towards long term goal with ability to perform greater amount of single leg stance compared to the previous session. Patient has had bouts of atrial fibrillation limiting exercising activity and enudrance with exercie performance. Will continue to focus on improving current limitations to return to prior level of function.     Rehab Potential  Good    Clinical Impairments Affecting Rehab Potential  Positive: motivation; Negative: , age    PT Frequency  2x / week    PT Duration  6 weeks    PT Treatment/Interventions  ADLs/Self Care Home Management;Aquatic Therapy;Biofeedback;DME Instruction;Gait training;Stair training;Functional mobility training;Therapeutic activities;Therapeutic  exercise;Balance training;Neuromuscular re-education;Patient/family education;Passive range of motion;Energy conservation;Ultrasound;Moist Heat;Iontophoresis 4mg /ml Dexamethasone;Cryotherapy;Electrical Stimulation;Manual techniques;Dry needling    PT Next Visit Plan  Progress balance and strengthening exercises,  PT Home Exercise Plan  See education section    Consulted and Agree with Plan of Care  Patient       Patient will benefit from skilled therapeutic intervention in order to improve the following deficits and impairments:  Pain, Increased fascial restricitons, Decreased coordination, Decreased mobility, Decreased activity tolerance, Decreased endurance, Decreased range of motion, Decreased strength, Hypomobility, Postural dysfunction, Abnormal gait, Decreased balance, Difficulty walking  Visit Diagnosis: Muscle weakness (generalized) - Plan: PT plan of care cert/re-cert  Unsteadiness on feet - Plan: PT plan of care cert/re-cert  History of falling - Plan: PT plan of care cert/re-cert     Problem List Patient Active Problem List   Diagnosis Date Noted  . Hypomagnesemia 09/29/2017  . Hypokalemia 09/29/2017  . Atrial fibrillation ([place]) 09/10/2017  . Trapezius strain, right, initial encounter 09/10/2017  . Depression, major, single episode, mild ([place]) 09/10/2017  . Chronic venous insufficiency 09/09/2017  . Lymphedema 09/09/2017  . Pain of toe of left foot 08/17/2017  . Grief 08/17/2017  . Epistaxis 07/31/2017  . Leg swelling 07/31/2017  . Dry eyes 07/06/2017  . Pain and swelling of lower extremity, right 03/29/2017  . Right shoulder pain 03/29/2017  . Diarrhea 05/18/2016  . Fall 05/01/2016  . Unintentional weight loss 12/30/2015  . Leg weakness, bilateral 12/30/2015  . Hyperkalemia 01/01/2015  . Occasional numbness/prickling/tingling of fingers and toes 12/18/2014  . Routine general medical examination at a health care facility 12/13/2013  . Macrocytic anemia  05/19/2013  . Psoriatic arthritis ([place]) 02/03/2013  . Psoriasis   . Osteoarthritis, multiple sites   . Episodic mood disorder ([place]) 12/01/2011  . History of breast cancer 10/28/2011  . GERD (gastroesophageal reflux disease)   . Hyperlipidemia   . Hypertension   . Osteopenia   . Carotid stenosis 05/27/2011    Blythe Stanford, PT DPT 11/03/2017, 10:52 AM  [place] [facility] [address], [place], [address] Phone: [phone]   Fax:  [phone]  Name: [name] MRN: [id number] Date of Birth: 06/15/1941

## 2017-11-09 ENCOUNTER — Ambulatory Visit
Admission: RE | Admit: 2017-11-09 | Discharge: 2017-11-09 | Disposition: A | Discharge status: Home / Self Care | Payer: Medicare Other | Source: Ambulatory Visit | Attending: Family Medicine | Admitting: Family Medicine

## 2017-11-09 DIAGNOSIS — Z853 Personal history of malignant neoplasm of breast: Secondary | ICD-10-CM | POA: Insufficient documentation

## 2017-11-09 DIAGNOSIS — Z1231 Encounter for screening mammogram for malignant neoplasm of breast: Secondary | ICD-10-CM | POA: Insufficient documentation

## 2017-11-10 ENCOUNTER — Ambulatory Visit: Payer: Medicare Other

## 2017-11-10 DIAGNOSIS — M6281 Muscle weakness (generalized): Secondary | ICD-10-CM

## 2017-11-10 DIAGNOSIS — R2681 Unsteadiness on feet: Secondary | ICD-10-CM | POA: Diagnosis not present

## 2017-11-10 DIAGNOSIS — Z9181 History of falling: Secondary | ICD-10-CM

## 2017-11-10 NOTE — Therapy (Signed)
[facility] [address] [place], [address] Phone: [phone]   Fax:  [phone]  Physical Therapy Treatment  Patient Details  Name: [name] MRN: [id number] Date of Birth: [birth year] Referring Provider: Caryl Bis MD   Encounter Date: 11/10/2017  PT End of Session - 11/10/17 1011    Visit Number  23    Number of Visits  32    Date for PT Re-Evaluation  12/15/17    Authorization Type  Progress note 1/10     PT Start Time  1000    PT Stop Time  1045    PT Time Calculation (min)  45 min    Equipment Utilized During Treatment  Gait belt    Activity Tolerance  Patient tolerated treatment well    Behavior During Therapy  [facility] for tasks assessed/performed       Past Medical History:  Diagnosis Date  . (HFpEF) heart failure with preserved ejection fraction ([place])    a. 08/2017 Echo: EF 55-60%, no rwma, mild MR, mildly dil LA, nl RV fxn.  . Breast cancer ([place]) 2001   left breast  . Cancer ([place]) 2001   left breast ca  . Carotid arterial disease ([place])    a. 10/2004 s/p L CEA; b. 12/2015 Carotid U/S: RICA 1-39%; b. LICA patent CEA site.  Marland Kitchen GERD (gastroesophageal reflux disease)   . Hyperlipidemia   . Hypertension   . Osteoarthritis, multiple sites   . Osteopenia   . Persistent atrial fibrillation ([place])    a. Dx 08/2017; b. CHA2DS2VASc = 6-->Xarelto; c. 09/2017 Successful DCCV (second shock - 200J);   Marland Kitchen Personal history of radiation therapy 2001   left breast ca  . Psoriasis     Past Surgical History:  Procedure Laterality Date  . ABDOMINAL HYSTERECTOMY    . ANKLE FRACTURE SURGERY  4/08   left---hardware still in place  . 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 BREAST EXCISIONAL BIOPSY Left yrs ago   benign  . BREAST LUMPECTOMY Left 2001   f/u radiation  . CARDIOVERSION N/A 10/04/2017   Procedure: CARDIOVERSION;  Surgeon: Wellington Hampshire, MD;  Location: [facility] ORS;  Service: Cardiovascular;   Laterality: N/A;  . CAROTID ENDARTERECTOMY Left 10/23/2004  . FRACTURE SURGERY    . OOPHORECTOMY    . SHOULDER SURGERY  6/07   left  . TONSILLECTOMY AND ADENOIDECTOMY    . TOTAL HIP ARTHROPLASTY  2004   right    There were no vitals filed for this visit.  Subjective Assessment - 11/10/17 1006    Subjective  Patient reports no major changes since the previous treatment session. Patient reports she hadn't been feeling well but is feeling better today.     Pertinent History  Patient previously seen for balance difficulties and shoulder pain and the right.  Patient reports no falls in the past six months. Patient reports pain has not improved for the past month and has been about the same since the onset of pain.     Limitations  Walking    How long can you sit comfortably?       Patient Stated Goals  To improve balance when walking     Currently in Pain?  No/denies       TREATMENT:   Therapeutic Exercise: Nustep step level 4 - x5 min; with use of arms and legs to improve strengthening  Side stepping across airex beam - x  10 down and back Step ups onto 6" step with airex pad on top - 2 x 10  Tandem balance on airex pad  - x 1.82min Sit to stands with TRX straps - 2 x 10  Heel raises off of airex - x 20 Side stepping with YTB - 3 x 51ft   Pt demonstrates increased fatigue with exercise at end of session.    PT Education - 11/10/17 1010    Education provided  Yes    Education Details  form/technique with exercise    Person(s) Educated  Patient    Methods  Explanation;Demonstration    Comprehension  Verbalized understanding;Returned demonstration          PT Long Term Goals - 11/03/17 1046      PT LONG TERM GOAL #1   Title  Patient will be independent with HEP to continue benefits of therapy after discharge.     Baseline  Dependent with form and technique for balancing exercises; moderate cueing for balance and strengthening    Time  4    Period  Weeks    Status   On-going      PT LONG TERM GOAL #2   Title  Patient will improve FGA to over 26/30 to indicate functional improvement in strength and balance limitations and decrease fall risk    Baseline  22/30; ; 09/01/2017: 24/30; 09/30/2017: 25/30; 11/03/2017: 25/30    Time  8    Period  Weeks    Status  On-going      PT LONG TERM GOAL #3   Title  Patient will improve 5xSTS to under 16 seconds without use of a pillow to indicate functional improvement of LE function and decreased fall risk    Baseline  19 sec with pillow under chair; 09/01/2017 12 seconds with use of a pillow; 09/30/2017: 10.9 sec with use of pillow; defered today secondary to increased soreness from walking the previous day    Time  8    Period  Weeks    Status  On-going      PT LONG TERM GOAL #4   Title  Patient will be able to balance for 10sec with SLS to improve static balance and ability to get dressed when in standing    Baseline  1 sec; 09/01/2017: 3 sec each leg; 09/30/17: 4.5; 11/03/2017: 5 sec    Time  8    Period  Weeks    Status  On-going            Plan - 11/10/17 1046    Clinical Impression Statement  Patient demonstrates improvement with ability to perform greater amount of standing exercises without use of UE support. Patient demonstrates improvement with ability to perform step ups without increase in pain and increased fatigue with exercise. Patient will benefit from further skilled therapy to return to prior level of function.     Rehab Potential  Good    Clinical Impairments Affecting Rehab Potential  Positive: motivation; Negative: , age    PT Frequency  2x / week    PT Duration  6 weeks    PT Treatment/Interventions  ADLs/Self Care Home Management;Aquatic Therapy;Biofeedback;DME Instruction;Gait training;Stair training;Functional mobility training;Therapeutic activities;Therapeutic exercise;Balance training;Neuromuscular re-education;Patient/family education;Passive range of motion;Energy  conservation;Ultrasound;Moist Heat;Iontophoresis 4mg /ml Dexamethasone;Cryotherapy;Electrical Stimulation;Manual techniques;Dry needling    PT Next Visit Plan  Progress balance and strengthening exercises,     PT Home Exercise Plan  See education section    Consulted and Agree with Plan of Care  Patient       Patient will benefit from skilled therapeutic intervention in order to improve the following deficits and impairments:  Pain, Increased fascial restricitons, Decreased coordination, Decreased mobility, Decreased activity tolerance, Decreased endurance, Decreased range of motion, Decreased strength, Hypomobility, Postural dysfunction, Abnormal gait, Decreased balance, Difficulty walking  Visit Diagnosis: Muscle weakness (generalized)  Unsteadiness on feet  History of falling     Problem List Patient Active Problem List   Diagnosis Date Noted  . Hypomagnesemia 09/29/2017  . Hypokalemia 09/29/2017  . Atrial fibrillation ([place]) 09/10/2017  . Trapezius strain, right, initial encounter 09/10/2017  . Depression, major, single episode, mild ([place]) 09/10/2017  . Chronic venous insufficiency 09/09/2017  . Lymphedema 09/09/2017  . Pain of toe of left foot 08/17/2017  . Grief 08/17/2017  . Epistaxis 07/31/2017  . Leg swelling 07/31/2017  . Dry eyes 07/06/2017  . Pain and swelling of lower extremity, right 03/29/2017  . Right shoulder pain 03/29/2017  . Diarrhea 05/18/2016  . Fall 05/01/2016  . Unintentional weight loss 12/30/2015  . Leg weakness, bilateral 12/30/2015  . Hyperkalemia 01/01/2015  . Occasional numbness/prickling/tingling of fingers and toes 12/18/2014  . Routine general medical examination at a health care facility 12/13/2013  . Macrocytic anemia 05/19/2013  . Psoriatic arthritis ([place]) 02/03/2013  . Psoriasis   . Osteoarthritis, multiple sites   . Episodic mood disorder ([place]) 12/01/2011  . History of breast cancer 10/28/2011  . GERD (gastroesophageal reflux  disease)   . Hyperlipidemia   . Hypertension   . Osteopenia   . Carotid stenosis 05/27/2011    Blythe Stanford, PT DPT  11/10/2017, 10:55 AM  [facility] [address] [place], [address] Phone: [phone]   Fax:  [phone]  Name: [name] MRN: [id number] Date of Birth: 02/20/1941

## 2017-11-15 ENCOUNTER — Other Ambulatory Visit: Payer: Self-pay | Admitting: Family Medicine

## 2017-11-15 MED ORDER — LORAZEPAM 0.5 MG PO TABS: ORAL_TABLET | ORAL | 0 refills | Status: DC

## 2017-11-15 NOTE — Telephone Encounter (Signed)
Copied from [place] [id number]. Topic: Quick Communication - Rx Refill/Question >> Nov 15, 2017  8:47 AM Synthia Innocent wrote: Medication: LORazepam (ATIVAN) 0.5 MG tablet  Has the patient contacted their pharmacy? Yes.   (Agent: If no, request that the patient contact the pharmacy for the refill.) Preferred Pharmacy (with phone number or street name): [facility] Agent: Please be advised that RX refills may take up to 3 business days. We ask that you follow-up with your pharmacy.

## 2017-11-15 NOTE — Telephone Encounter (Signed)
Rx refill request: Ativan 0.5       Last filled 06/25/17 #60   LOV: 09/29/17   PCP: Sharon: verified

## 2017-11-15 NOTE — Telephone Encounter (Signed)
Would you like to refill? 

## 2017-11-16 NOTE — Telephone Encounter (Signed)
Approved per provider.

## 2017-11-17 ENCOUNTER — Ambulatory Visit: Payer: Medicare Other | Attending: Family Medicine

## 2017-11-17 DIAGNOSIS — M6281 Muscle weakness (generalized): Secondary | ICD-10-CM | POA: Diagnosis not present

## 2017-11-17 DIAGNOSIS — R2681 Unsteadiness on feet: Secondary | ICD-10-CM | POA: Diagnosis not present

## 2017-11-17 DIAGNOSIS — Z9181 History of falling: Secondary | ICD-10-CM | POA: Diagnosis not present

## 2017-11-17 DIAGNOSIS — M25511 Pain in right shoulder: Secondary | ICD-10-CM | POA: Insufficient documentation

## 2017-11-17 NOTE — Therapy (Signed)
[facility] [address], [place], [address] Phone: [phone]   Fax:  [phone]  Physical Therapy Treatment  Patient Details  Name: [name] MRN: [id number] Date of Birth: 01-06-1941 Referring Provider: Caryl Bis MD   Encounter Date: 11/17/2017  PT End of Session - 11/17/17 1029    Visit Number  24    Number of Visits  32    Date for PT Re-Evaluation  12/15/17    Authorization Type  Progress note 2/10     PT Start Time  0945    PT Stop Time  1030    PT Time Calculation (min)  45 min    Equipment Utilized During Treatment  Gait belt    Activity Tolerance  Patient tolerated treatment well    Behavior During Therapy  [facility] for tasks assessed/performed       Past Medical History:  Diagnosis Date  . (HFpEF) heart failure with preserved ejection fraction ([place])    a. 08/2017 Echo: EF 55-60%, no rwma, mild MR, mildly dil LA, nl RV fxn.  . Breast cancer ([place]) 2001   left breast  . Cancer ([place]) 2001   left breast ca  . Carotid arterial disease ([place])    a. 10/2004 s/p L CEA; b. 12/2015 Carotid U/S: RICA 1-39%; b. LICA patent CEA site.  Marland Kitchen GERD (gastroesophageal reflux disease)   . Hyperlipidemia   . Hypertension   . Osteoarthritis, multiple sites   . Osteopenia   . Persistent atrial fibrillation ([place])    a. Dx 08/2017; b. CHA2DS2VASc = 6-->Xarelto; c. 09/2017 Successful DCCV (second shock - 200J);   Marland Kitchen Personal history of radiation therapy 2001   left breast ca  . Psoriasis     Past Surgical History:  Procedure Laterality Date  . ABDOMINAL HYSTERECTOMY    . ANKLE FRACTURE SURGERY  4/08   left---hardware still in place  . 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 BREAST EXCISIONAL BIOPSY Left yrs ago   benign  . BREAST LUMPECTOMY Left 2001   f/u radiation  . CARDIOVERSION N/A 10/04/2017   Procedure: CARDIOVERSION;  Surgeon: Wellington Hampshire, MD;  Location: [facility] ORS;  Service: Cardiovascular;   Laterality: N/A;  . CAROTID ENDARTERECTOMY Left 10/23/2004  . FRACTURE SURGERY    . OOPHORECTOMY    . SHOULDER SURGERY  6/07   left  . TONSILLECTOMY AND ADENOIDECTOMY    . TOTAL HIP ARTHROPLASTY  2004   right    There were no vitals filed for this visit.  Subjective Assessment - 11/17/17 1023    Subjective  Patient reports she felt unsteady on her feet when pulling weeds in the yard over the weekend and would ike for me to check her balance.     Pertinent History  Patient previously seen for balance difficulties and shoulder pain and the right.  Patient reports no falls in the past six months. Patient reports pain has not improved for the past month and has been about the same since the onset of pain.     Limitations  Walking    How long can you sit comfortably?       Patient Stated Goals  To improve balance when walking     Currently in Pain?  No/denies       TREATMENT:   Therapeutic Exercise: Nustep step level 5 - x3.5 min; level 4 - x1.71min with use of arms and legs to improve strengthening  Side stepping up and over 6" step with airex on top - x 14 Cone lifts with standing on airex pad - x 10 with turning directions Side stepping across balance stones -x 10 with intermittent UE support (4 stones)  Hip abduction into extension in standing - 2 x 10 performed B Sit to stands with TRX straps - 2 x 10     Pt demonstrates increased fatigue with exercise at end of session.    PT Education - 11/17/17 1029    Education provided  Yes    Education Details  form/technique with exercise    Person(s) Educated  Patient    Methods  Explanation;Demonstration    Comprehension  Verbalized understanding;Returned demonstration          PT Long Term Goals - 11/03/17 1046      PT LONG TERM GOAL #1   Title  Patient will be independent with HEP to continue benefits of therapy after discharge.     Baseline  Dependent with form and technique for balancing exercises; moderate cueing for  balance and strengthening    Time  4    Period  Weeks    Status  On-going      PT LONG TERM GOAL #2   Title  Patient will improve FGA to over 26/30 to indicate functional improvement in strength and balance limitations and decrease fall risk    Baseline  22/30; ; 09/01/2017: 24/30; 09/30/2017: 25/30; 11/03/2017: 25/30    Time  8    Period  Weeks    Status  On-going      PT LONG TERM GOAL #3   Title  Patient will improve 5xSTS to under 16 seconds without use of a pillow to indicate functional improvement of LE function and decreased fall risk    Baseline  19 sec with pillow under chair; 09/01/2017 12 seconds with use of a pillow; 09/30/2017: 10.9 sec with use of pillow; defered today secondary to increased soreness from walking the previous day    Time  8    Period  Weeks    Status  On-going      PT LONG TERM GOAL #4   Title  Patient will be able to balance for 10sec with SLS to improve static balance and ability to get dressed when in standing    Baseline  1 sec; 09/01/2017: 3 sec each leg; 09/30/17: 4.5; 11/03/2017: 5 sec    Time  8    Period  Weeks    Status  On-going            Plan - 11/17/17 1127    Clinical Impression Statement  Patient demonstrates improvement with exercise but demosntrates decreased balance with exercises requiring intermittent UE support with exercises. Patient demonstrates decreased stabilization with performing squatting motions of compliant surfaces. Patient will benefit from further skilled therapy to return to prior level of function.     Rehab Potential  Good    Clinical Impairments Affecting Rehab Potential  Positive: motivation; Negative: , age    PT Frequency  2x / week    PT Duration  6 weeks    PT Treatment/Interventions  ADLs/Self Care Home Management;Aquatic Therapy;Biofeedback;DME Instruction;Gait training;Stair training;Functional mobility training;Therapeutic activities;Therapeutic exercise;Balance training;Neuromuscular  re-education;Patient/family education;Passive range of motion;Energy conservation;Ultrasound;Moist Heat;Iontophoresis 4mg /ml Dexamethasone;Cryotherapy;Electrical Stimulation;Manual techniques;Dry needling    PT Next Visit Plan  Progress balance and strengthening exercises,     PT Home Exercise Plan  See education section    Consulted and Agree with  Plan of Care  Patient       Patient will benefit from skilled therapeutic intervention in order to improve the following deficits and impairments:  Pain, Increased fascial restricitons, Decreased coordination, Decreased mobility, Decreased activity tolerance, Decreased endurance, Decreased range of motion, Decreased strength, Hypomobility, Postural dysfunction, Abnormal gait, Decreased balance, Difficulty walking  Visit Diagnosis: Muscle weakness (generalized)  Unsteadiness on feet  History of falling     Problem List Patient Active Problem List   Diagnosis Date Noted  . Hypomagnesemia 09/29/2017  . Hypokalemia 09/29/2017  . Atrial fibrillation ([place]) 09/10/2017  . Trapezius strain, right, initial encounter 09/10/2017  . Depression, major, single episode, mild ([place]) 09/10/2017  . Chronic venous insufficiency 09/09/2017  . Lymphedema 09/09/2017  . Pain of toe of left foot 08/17/2017  . Grief 08/17/2017  . Epistaxis 07/31/2017  . Leg swelling 07/31/2017  . Dry eyes 07/06/2017  . Pain and swelling of lower extremity, right 03/29/2017  . Right shoulder pain 03/29/2017  . Diarrhea 05/18/2016  . Fall 05/01/2016  . Unintentional weight loss 12/30/2015  . Leg weakness, bilateral 12/30/2015  . Hyperkalemia 01/01/2015  . Occasional numbness/prickling/tingling of fingers and toes 12/18/2014  . Routine general medical examination at a health care facility 12/13/2013  . Macrocytic anemia 05/19/2013  . Psoriatic arthritis ([place]) 02/03/2013  . Psoriasis   . Osteoarthritis, multiple sites   . Episodic mood disorder ([place]) 12/01/2011  . History  of breast cancer 10/28/2011  . GERD (gastroesophageal reflux disease)   . Hyperlipidemia   . Hypertension   . Osteopenia   . Carotid stenosis 05/27/2011    Blythe Stanford, PT DPT 11/17/2017, 11:33 AM  [facility] [address] [place], [address] Phone: [phone]   Fax:  [phone]  Name: [name] MRN: [id number] Date of Birth: Sep 27, 1940

## 2017-11-22 ENCOUNTER — Ambulatory Visit: Admission: RE | Admit: 2017-11-22 | Payer: Medicare Other | Source: Ambulatory Visit

## 2017-11-22 ENCOUNTER — Emergency Department
Admission: EM | Admit: 2017-11-22 | Discharge: 2017-11-22 | Disposition: A | Discharge status: Home / Self Care | Payer: Medicare Other | Attending: Emergency Medicine | Admitting: Emergency Medicine

## 2017-11-22 ENCOUNTER — Telehealth: Payer: Self-pay | Admitting: Family Medicine

## 2017-11-22 ENCOUNTER — Ambulatory Visit: Payer: Medicare Other | Admitting: Physical Therapy

## 2017-11-22 ENCOUNTER — Emergency Department: Payer: Medicare Other

## 2017-11-22 ENCOUNTER — Encounter: Payer: Self-pay | Admitting: Physical Therapy

## 2017-11-22 ENCOUNTER — Other Ambulatory Visit: Payer: Self-pay

## 2017-11-22 ENCOUNTER — Encounter: Payer: Self-pay | Admitting: Emergency Medicine

## 2017-11-22 DIAGNOSIS — R2681 Unsteadiness on feet: Secondary | ICD-10-CM

## 2017-11-22 DIAGNOSIS — R531 Weakness: Secondary | ICD-10-CM | POA: Insufficient documentation

## 2017-11-22 DIAGNOSIS — Z9181 History of falling: Secondary | ICD-10-CM

## 2017-11-22 DIAGNOSIS — M25511 Pain in right shoulder: Secondary | ICD-10-CM

## 2017-11-22 DIAGNOSIS — R000 Tachycardia, unspecified: Secondary | ICD-10-CM | POA: Insufficient documentation

## 2017-11-22 DIAGNOSIS — Z5321 Procedure and treatment not carried out due to patient leaving prior to being seen by health care provider: Secondary | ICD-10-CM | POA: Diagnosis not present

## 2017-11-22 DIAGNOSIS — M6281 Muscle weakness (generalized): Secondary | ICD-10-CM

## 2017-11-22 LAB — BASIC METABOLIC PANEL
Anion gap: 9 (ref 5–15)
BUN: 31 mg/dL — ABNORMAL HIGH (ref 6–20)
CO2: 29 mmol/L (ref 22–32)
Calcium: 9.5 mg/dL (ref 8.9–10.3)
Chloride: 100 mmol/L — ABNORMAL LOW (ref 101–111)
Creatinine, Ser: 1.12 mg/dL — ABNORMAL HIGH (ref 0.44–1.00)
GFR calc Af Amer: 53 mL/min — ABNORMAL LOW (ref >60)
GFR calc non Af Amer: 46 mL/min — ABNORMAL LOW (ref >60)
Glucose, Bld: 129 mg/dL — ABNORMAL HIGH (ref 65–99)
Potassium: 3.8 mmol/L (ref 3.5–5.1)
Sodium: 138 mmol/L (ref 135–145)

## 2017-11-22 LAB — CBC
HCT: 37.7 % (ref 35.0–47.0)
Hemoglobin: 12.6 g/dL (ref 12.0–16.0)
MCH: 34.7 pg — ABNORMAL HIGH (ref 26.0–34.0)
MCHC: 33.5 g/dL (ref 32.0–36.0)
MCV: 103.8 fL — ABNORMAL HIGH (ref 80.0–100.0)
Platelets: 296 10*3/uL (ref 150–440)
RBC: 3.63 MIL/uL — ABNORMAL LOW (ref 3.80–5.20)
RDW: 15.1 % — ABNORMAL HIGH (ref 11.5–14.5)
WBC: 7.8 10*3/uL (ref 3.6–11.0)

## 2017-11-22 LAB — TROPONIN I: Troponin I: <0.03 ng/mL (ref <0.03)

## 2017-11-22 NOTE — Telephone Encounter (Signed)
Noted.  It appears she is in the ED now.

## 2017-11-22 NOTE — Telephone Encounter (Signed)
Patient is at PT- she state she feels winded and tired with activity.  BP 94/70   P 90 / 124- palpated pulse feels jumpy and  irregular-  Afib . Call to office- advised by office- patient needs to go to ED. Advised PT- to make patient aware. Patient is reluctant to go- but will go to ED for check- she states she will not stay a long time. She is going to drive herself- she drove herself to the PT appointment.

## 2017-11-22 NOTE — Therapy (Signed)
[facility] [address] [place], [address] Phone: [phone]   Fax:  [phone]  Physical Therapy Treatment  Patient Details  Name: [name] MRN: [id number] Date of Birth: Jul 12, 1941 Referring Provider: Caryl Bis MD   Encounter Date: 11/22/2017  PT End of Session - 11/22/17 1433    Visit Number  --    Number of Visits  --    Date for PT Re-Evaluation  --    Authorization Type  --    PT Start Time  --    Equipment Utilized During Treatment  Gait belt    Activity Tolerance  Patient tolerated treatment well    Behavior During Therapy  [facility] for tasks assessed/performed       Past Medical History:  Diagnosis Date  . (HFpEF) heart failure with preserved ejection fraction ([place])    a. 08/2017 Echo: EF 55-60%, no rwma, mild MR, mildly dil LA, nl RV fxn.  . Breast cancer ([place]) 2001   left breast  . Cancer ([place]) 2001   left breast ca  . Carotid arterial disease ([place])    a. 10/2004 s/p L CEA; b. 12/2015 Carotid U/S: RICA 1-39%; b. LICA patent CEA site.  Marland Kitchen GERD (gastroesophageal reflux disease)   . Hyperlipidemia   . Hypertension   . Osteoarthritis, multiple sites   . Osteopenia   . Persistent atrial fibrillation ([place])    a. Dx 08/2017; b. CHA2DS2VASc = 6-->Xarelto; c. 09/2017 Successful DCCV (second shock - 200J);   Marland Kitchen Personal history of radiation therapy 2001   left breast ca  . Psoriasis     Past Surgical History:  Procedure Laterality Date  . ABDOMINAL HYSTERECTOMY    . ANKLE FRACTURE SURGERY  4/08   left---hardware still in place  . 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 BREAST EXCISIONAL BIOPSY Left yrs ago   benign  . BREAST LUMPECTOMY Left 2001   f/u radiation  . CARDIOVERSION N/A 10/04/2017   Procedure: CARDIOVERSION;  Surgeon: Wellington Hampshire, MD;  Location: [facility] ORS;  Service: Cardiovascular;  Laterality: N/A;  . CAROTID ENDARTERECTOMY Left 10/23/2004  . FRACTURE SURGERY    .  OOPHORECTOMY    . SHOULDER SURGERY  6/07   left  . TONSILLECTOMY AND ADENOIDECTOMY    . TOTAL HIP ARTHROPLASTY  2004   right    There were no vitals filed for this visit.  Subjective Assessment - 11/22/17 1434    Subjective  Pt presented to session feeling "puny" and says she has been feeling winded with just walking to the mailbox.  She says this is not unsual for her and this happens randomly throughout her week with activity. Pt reports she never noticed this before she was diagnosed with afib.  Vitals taken in sitting at start of session with BP 94/70, SpO2 97%, pulse 90-122 at rest in sitting (100 when taken manually and feels like rate is changing). This PT called Dr. Ellen Henri office and spoke with Opal Sidles (RN) and shared the above information with the RN and concern for pt being in afib.  Of note, pt was diagnosed with afib ~2 months ago and is on medication for afib and has had a cardioversion x2 without success.  Pt says she can't tell when she is afib.  RN instructed pt to go to the ED and RN says it is ok for the pt to drive herself (ED is ~10 minutes away). Pt in  agreement to drive to the ED, pt left at 15:00.      Pertinent History  Patient previously seen for balance difficulties and shoulder pain and the right.  Patient reports no falls in the past six months. Patient reports pain has not improved for the past month and has been about the same since the onset of pain.     Limitations  Walking    How long can you sit comfortably?       Patient Stated Goals  To improve balance when walking     Currently in Pain?  No/denies          PT Education - 11/22/17 1433    Education provided  Yes    Education Details  Shared conversation details from conversation with RN    Person(s) Educated  Patient    Methods  Explanation    Comprehension  Verbalized understanding          PT Long Term Goals - 11/03/17 1046      PT LONG TERM GOAL #1   Title  Patient will be independent  with HEP to continue benefits of therapy after discharge.     Baseline  Dependent with form and technique for balancing exercises; moderate cueing for balance and strengthening    Time  4    Period  Weeks    Status  On-going      PT LONG TERM GOAL #2   Title  Patient will improve FGA to over 26/30 to indicate functional improvement in strength and balance limitations and decrease fall risk    Baseline  22/30; ; 09/01/2017: 24/30; 09/30/2017: 25/30; 11/03/2017: 25/30    Time  8    Period  Weeks    Status  On-going      PT LONG TERM GOAL #3   Title  Patient will improve 5xSTS to under 16 seconds without use of a pillow to indicate functional improvement of LE function and decreased fall risk    Baseline  19 sec with pillow under chair; 09/01/2017 12 seconds with use of a pillow; 09/30/2017: 10.9 sec with use of pillow; defered today secondary to increased soreness from walking the previous day    Time  8    Period  Weeks    Status  On-going      PT LONG TERM GOAL #4   Title  Patient will be able to balance for 10sec with SLS to improve static balance and ability to get dressed when in standing    Baseline  1 sec; 09/01/2017: 3 sec each leg; 09/30/17: 4.5; 11/03/2017: 5 sec    Time  8    Period  Weeks    Status  On-going            Plan - 11/22/17 1437    Rehab Potential  Good    Clinical Impairments Affecting Rehab Potential  Positive: motivation; Negative: , age    PT Frequency  2x / week    PT Duration  6 weeks    PT Treatment/Interventions  ADLs/Self Care Home Management;Aquatic Therapy;Biofeedback;DME Instruction;Gait training;Stair training;Functional mobility training;Therapeutic activities;Therapeutic exercise;Balance training;Neuromuscular re-education;Patient/family education;Passive range of motion;Energy conservation;Ultrasound;Moist Heat;Iontophoresis 4mg /ml Dexamethasone;Cryotherapy;Electrical Stimulation;Manual techniques;Dry needling    PT Next Visit Plan  Progress  balance and strengthening exercises,     PT Home Exercise Plan  See education section    Consulted and Agree with Plan of Care  Patient       Patient will benefit from skilled therapeutic  intervention in order to improve the following deficits and impairments:  Pain, Increased fascial restricitons, Decreased coordination, Decreased mobility, Decreased activity tolerance, Decreased endurance, Decreased range of motion, Decreased strength, Hypomobility, Postural dysfunction, Abnormal gait, Decreased balance, Difficulty walking  Visit Diagnosis: Muscle weakness (generalized)  Unsteadiness on feet  History of falling  Acute pain of right shoulder     Problem List Patient Active Problem List   Diagnosis Date Noted  . Hypomagnesemia 09/29/2017  . Hypokalemia 09/29/2017  . Atrial fibrillation ([place]) 09/10/2017  . Trapezius strain, right, initial encounter 09/10/2017  . Depression, major, single episode, mild ([place]) 09/10/2017  . Chronic venous insufficiency 09/09/2017  . Lymphedema 09/09/2017  . Pain of toe of left foot 08/17/2017  . Grief 08/17/2017  . Epistaxis 07/31/2017  . Leg swelling 07/31/2017  . Dry eyes 07/06/2017  . Pain and swelling of lower extremity, right 03/29/2017  . Right shoulder pain 03/29/2017  . Diarrhea 05/18/2016  . Fall 05/01/2016  . Unintentional weight loss 12/30/2015  . Leg weakness, bilateral 12/30/2015  . Hyperkalemia 01/01/2015  . Occasional numbness/prickling/tingling of fingers and toes 12/18/2014  . Routine general medical examination at a health care facility 12/13/2013  . Macrocytic anemia 05/19/2013  . Psoriatic arthritis ([place]) 02/03/2013  . Psoriasis   . Osteoarthritis, multiple sites   . Episodic mood disorder ([place]) 12/01/2011  . History of breast cancer 10/28/2011  . GERD (gastroesophageal reflux disease)   . Hyperlipidemia   . Hypertension   . Osteopenia   . Carotid stenosis 05/27/2011    Collie Siad PT, DPT 11/22/2017, 3:02  PM  [facility] [address] [place], [address] Phone: [phone]   Fax:  [phone]  Name: [name] MRN: [id number] Date of Birth: [birth year]

## 2017-11-22 NOTE — Telephone Encounter (Signed)
FYI

## 2017-11-22 NOTE — ED Triage Notes (Signed)
Pt has physical therapy today and during session pt told the therapist she felt a little weak today and wanted to take thing easy. PT took vital signs and noticed a fast heart rate and wanted patient to be evaluated. Pt denies and pain or shortness of breath and states "I really just want to go home." pt ambulated with no distress to triage room.

## 2017-11-22 NOTE — Telephone Encounter (Signed)
fyi

## 2017-11-24 ENCOUNTER — Ambulatory Visit: Payer: Medicare Other

## 2017-11-24 DIAGNOSIS — R2681 Unsteadiness on feet: Secondary | ICD-10-CM

## 2017-11-24 DIAGNOSIS — Z9181 History of falling: Secondary | ICD-10-CM | POA: Diagnosis not present

## 2017-11-24 DIAGNOSIS — M6281 Muscle weakness (generalized): Secondary | ICD-10-CM

## 2017-11-24 DIAGNOSIS — M25511 Pain in right shoulder: Secondary | ICD-10-CM | POA: Diagnosis not present

## 2017-11-24 NOTE — Therapy (Signed)
[facility] [address], [place], [address] Phone: [phone]   Fax:  [phone]  Physical Therapy Treatment  Patient Details  Name: [name] MRN: [id number] Date of Birth: Nov 04, 1940 Referring Provider: Caryl Bis MD   Encounter Date: 11/24/2017  PT End of Session - 11/24/17 1210    Visit Number  25    Number of Visits  32    Date for PT Re-Evaluation  12/15/17    Authorization Type  Progress note 3/10     PT Start Time  1015    PT Stop Time  1100    PT Time Calculation (min)  45 min    Equipment Utilized During Treatment  Gait belt    Activity Tolerance  Patient tolerated treatment well    Behavior During Therapy  [facility] for tasks assessed/performed       Past Medical History:  Diagnosis Date  . (HFpEF) heart failure with preserved ejection fraction ([place])    a. 08/2017 Echo: EF 55-60%, no rwma, mild MR, mildly dil LA, nl RV fxn.  . Breast cancer ([place]) 2001   left breast  . Cancer ([place]) 2001   left breast ca  . Carotid arterial disease ([place])    a. 10/2004 s/p L CEA; b. 12/2015 Carotid U/S: RICA 1-39%; b. LICA patent CEA site.  Marland Kitchen GERD (gastroesophageal reflux disease)   . Hyperlipidemia   . Hypertension   . Osteoarthritis, multiple sites   . Osteopenia   . Persistent atrial fibrillation ([place])    a. Dx 08/2017; b. CHA2DS2VASc = 6-->Xarelto; c. 09/2017 Successful DCCV (second shock - 200J);   Marland Kitchen Personal history of radiation therapy 2001   left breast ca  . Psoriasis     Past Surgical History:  Procedure Laterality Date  . ABDOMINAL HYSTERECTOMY    . ANKLE FRACTURE SURGERY  4/08   left---hardware still in place  . 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 BREAST EXCISIONAL BIOPSY Left yrs ago   benign  . BREAST LUMPECTOMY Left 2001   f/u radiation  . CARDIOVERSION N/A 10/04/2017   Procedure: CARDIOVERSION;  Surgeon: Wellington Hampshire, MD;  Location: [facility] ORS;  Service: Cardiovascular;   Laterality: N/A;  . CAROTID ENDARTERECTOMY Left 10/23/2004  . FRACTURE SURGERY    . OOPHORECTOMY    . SHOULDER SURGERY  6/07   left  . TONSILLECTOMY AND ADENOIDECTOMY    . TOTAL HIP ARTHROPLASTY  2004   right    There were no vitals filed for this visit.  Subjective Assessment - 11/24/17 1033    Subjective  Patient reports she is feeling much better today. Patient reports she is unable to tell she is in a-fib and went to the ER the day before. Patient reports she's seeing her cardiovascular MD over two thrusdays from now.     Pertinent History  Patient previously seen for balance difficulties and shoulder pain and the right.  Patient reports no falls in the past six months. Patient reports pain has not improved for the past month and has been about the same since the onset of pain.     Limitations  Walking    How long can you sit comfortably?       Patient Stated Goals  To improve balance when walking     Currently in Pain?  No/denies       TREATMENT:   Therapeutic Exercise: Nustep step level 4 - x91min with  use of arms and legs to improve strengthening  Side stepping up and over 4" step without UE support  - x 14 Heel raises unilateral B - x 20  Cone lifts x 3 - x 10 with turning directions Mini Squats on wobble board - x 20  Patient demonstrates no increase in pain or heart palpitation   PT Education - 11/24/17 1044    Education provided  Yes    Education Details  educated to continue monitoring a-fib symptoms; importance of taking medication    Person(s) Educated  Patient    Methods  Explanation;Demonstration    Comprehension  Verbalized understanding;Returned demonstration          PT Long Term Goals - 11/03/17 1046      PT LONG TERM GOAL #1   Title  Patient will be independent with HEP to continue benefits of therapy after discharge.     Baseline  Dependent with form and technique for balancing exercises; moderate cueing for balance and strengthening    Time  4     Period  Weeks    Status  On-going      PT LONG TERM GOAL #2   Title  Patient will improve FGA to over 26/30 to indicate functional improvement in strength and balance limitations and decrease fall risk    Baseline  22/30; ; 09/01/2017: 24/30; 09/30/2017: 25/30; 11/03/2017: 25/30    Time  8    Period  Weeks    Status  On-going      PT LONG TERM GOAL #3   Title  Patient will improve 5xSTS to under 16 seconds without use of a pillow to indicate functional improvement of LE function and decreased fall risk    Baseline  19 sec with pillow under chair; 09/01/2017 12 seconds with use of a pillow; 09/30/2017: 10.9 sec with use of pillow; defered today secondary to increased soreness from walking the previous day    Time  8    Period  Weeks    Status  On-going      PT LONG TERM GOAL #4   Title  Patient will be able to balance for 10sec with SLS to improve static balance and ability to get dressed when in standing    Baseline  1 sec; 09/01/2017: 3 sec each leg; 09/30/17: 4.5; 11/03/2017: 5 sec    Time  8    Period  Weeks    Status  On-going            Plan - 11/24/17 1212    Clinical Impression Statement  Patient's HR was between 90 to 120 throughout the session. Educated on following up with cardiologist to improve a-fib and monitor symptoms at home. Patient demosntrates increased postural sway to day mostnotably with bending motions. Patient will benefit from further skilled therapy to return to prior level of function.     Rehab Potential  Good    Clinical Impairments Affecting Rehab Potential  Positive: motivation; Negative: , age    PT Frequency  2x / week    PT Duration  6 weeks    PT Treatment/Interventions  ADLs/Self Care Home Management;Aquatic Therapy;Biofeedback;DME Instruction;Gait training;Stair training;Functional mobility training;Therapeutic activities;Therapeutic exercise;Balance training;Neuromuscular re-education;Patient/family education;Passive range of motion;Energy  conservation;Ultrasound;Moist Heat;Iontophoresis 4mg /ml Dexamethasone;Cryotherapy;Electrical Stimulation;Manual techniques;Dry needling    PT Next Visit Plan  Progress balance and strengthening exercises,     PT Home Exercise Plan  See education section    Consulted and Agree with Plan of Care  Patient  Patient will benefit from skilled therapeutic intervention in order to improve the following deficits and impairments:  Pain, Increased fascial restricitons, Decreased coordination, Decreased mobility, Decreased activity tolerance, Decreased endurance, Decreased range of motion, Decreased strength, Hypomobility, Postural dysfunction, Abnormal gait, Decreased balance, Difficulty walking  Visit Diagnosis: Muscle weakness (generalized)  Unsteadiness on feet  History of falling     Problem List Patient Active Problem List   Diagnosis Date Noted  . Hypomagnesemia 09/29/2017  . Hypokalemia 09/29/2017  . Atrial fibrillation ([place]) 09/10/2017  . Trapezius strain, right, initial encounter 09/10/2017  . Depression, major, single episode, mild ([place]) 09/10/2017  . Chronic venous insufficiency 09/09/2017  . Lymphedema 09/09/2017  . Pain of toe of left foot 08/17/2017  . Grief 08/17/2017  . Epistaxis 07/31/2017  . Leg swelling 07/31/2017  . Dry eyes 07/06/2017  . Pain and swelling of lower extremity, right 03/29/2017  . Right shoulder pain 03/29/2017  . Diarrhea 05/18/2016  . Fall 05/01/2016  . Unintentional weight loss 12/30/2015  . Leg weakness, bilateral 12/30/2015  . Hyperkalemia 01/01/2015  . Occasional numbness/prickling/tingling of fingers and toes 12/18/2014  . Routine general medical examination at a health care facility 12/13/2013  . Macrocytic anemia 05/19/2013  . Psoriatic arthritis ([place]) 02/03/2013  . Psoriasis   . Osteoarthritis, multiple sites   . Episodic mood disorder ([place]) 12/01/2011  . History of breast cancer 10/28/2011  . GERD (gastroesophageal reflux  disease)   . Hyperlipidemia   . Hypertension   . Osteopenia   . Carotid stenosis 05/27/2011    Blythe Stanford, PT DPT 11/24/2017, 12:20 PM  [facility] [address] [place], [address] Phone: [phone]   Fax:  [phone]  Name: [name] MRN: [id number] Date of Birth: 19-Mar-1941

## 2017-11-25 ENCOUNTER — Telehealth: Payer: Self-pay | Admitting: Family Medicine

## 2017-11-25 NOTE — Telephone Encounter (Signed)
Please call the patient to see how she is doing after leaving the ED prior to being evaluated for her elevated heart rate. Thanks.

## 2017-11-26 NOTE — Telephone Encounter (Signed)
Left message to return call, ok for pec to speak to patient and ask how she is feeling

## 2017-11-27 ENCOUNTER — Other Ambulatory Visit: Payer: Self-pay | Admitting: Family Medicine

## 2017-11-29 ENCOUNTER — Ambulatory Visit: Payer: Medicare Other

## 2017-11-29 DIAGNOSIS — Z9181 History of falling: Secondary | ICD-10-CM | POA: Diagnosis not present

## 2017-11-29 DIAGNOSIS — M6281 Muscle weakness (generalized): Secondary | ICD-10-CM | POA: Diagnosis not present

## 2017-11-29 DIAGNOSIS — M25511 Pain in right shoulder: Secondary | ICD-10-CM | POA: Diagnosis not present

## 2017-11-29 DIAGNOSIS — R2681 Unsteadiness on feet: Secondary | ICD-10-CM

## 2017-11-29 NOTE — Telephone Encounter (Signed)
Pt states she is feeling better. Pt wants dr to know she is seeing the cardiologist .  Pt states she had an EKG before she left, so pt felt she was ok.

## 2017-11-29 NOTE — Therapy (Signed)
[facility] [address], [place], [address] Phone: [phone]   Fax:  [phone]  Physical Therapy Treatment  Patient Details  Name: SUSANNA BENGE MRN: [id number] Date of Birth: 1940/09/03 Referring Provider: Caryl Bis MD   Encounter Date: 11/29/2017  PT End of Session - 11/29/17 1454    Visit Number  26    Number of Visits  32    Date for PT Re-Evaluation  12/15/17    Authorization Type  Progress note 4/10     PT Start Time  1430    PT Stop Time  1515    PT Time Calculation (min)  45 min    Equipment Utilized During Treatment  Gait belt    Activity Tolerance  Patient tolerated treatment well    Behavior During Therapy  [facility] for tasks assessed/performed       Past Medical History:  Diagnosis Date  . (HFpEF) heart failure with preserved ejection fraction ([place])    a. 08/2017 Echo: EF 55-60%, no rwma, mild MR, mildly dil LA, nl RV fxn.  . Breast cancer ([place]) 2001   left breast  . Cancer ([place]) 2001   left breast ca  . Carotid arterial disease ([place])    a. 10/2004 s/p L CEA; b. 12/2015 Carotid U/S: RICA 1-39%; b. LICA patent CEA site.  Marland Kitchen GERD (gastroesophageal reflux disease)   . Hyperlipidemia   . Hypertension   . Osteoarthritis, multiple sites   . Osteopenia   . Persistent atrial fibrillation ([place])    a. Dx 08/2017; b. CHA2DS2VASc = 6-->Xarelto; c. 09/2017 Successful DCCV (second shock - 200J);   Marland Kitchen Personal history of radiation therapy 2001   left breast ca  . Psoriasis     Past Surgical History:  Procedure Laterality Date  . ABDOMINAL HYSTERECTOMY    . ANKLE FRACTURE SURGERY  4/08   left---hardware still in place  . 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 BREAST EXCISIONAL BIOPSY Left yrs ago   benign  . BREAST LUMPECTOMY Left 2001   f/u radiation  . CARDIOVERSION N/A 10/04/2017   Procedure: CARDIOVERSION;  Surgeon: Wellington Hampshire, MD;  Location: [facility] ORS;  Service: Cardiovascular;   Laterality: N/A;  . CAROTID ENDARTERECTOMY Left 10/23/2004  . FRACTURE SURGERY    . OOPHORECTOMY    . SHOULDER SURGERY  6/07   left  . TONSILLECTOMY AND ADENOIDECTOMY    . TOTAL HIP ARTHROPLASTY  2004   right    There were no vitals filed for this visit.  Subjective Assessment - 11/29/17 1449    Subjective  Patient reports improvement in energy today versus previous visitation session. Patient reports no major changes since the previous session.     Pertinent History  Patient previously seen for balance difficulties and shoulder pain and the right.  Patient reports no falls in the past six months. Patient reports pain has not improved for the past month and has been about the same since the onset of pain.     Limitations  Walking    How long can you sit comfortably?       Patient Stated Goals  To improve balance when walking     Currently in Pain?  No/denies       TREATMENT:   Therapeutic Exercise: Nustep step level 5 - x50min with use of arms and legs to improve strengthening  Hip abduction in standing with UE support - 2 x 15  Sit to stands with TRX straps - 2x 10 Side stepping with YTB around ankles - 6 x 15 reps Standing on wobbleboard back and forth/ left and right - x 2 min Hip extension in standing with BTB - x 20 B Heel raises in standing - 2 x 20    Patient demonstrates no increase in pain or heart palpitation   PT Education - 11/29/17 1451    Education provided  Yes    Education Details  form/technique with exercises,     Person(s) Educated  Patient    Methods  Explanation;Demonstration    Comprehension  Verbalized understanding;Returned demonstration          PT Long Term Goals - 11/03/17 1046      PT LONG TERM GOAL #1   Title  Patient will be independent with HEP to continue benefits of therapy after discharge.     Baseline  Dependent with form and technique for balancing exercises; moderate cueing for balance and strengthening    Time  4    Period   Weeks    Status  On-going      PT LONG TERM GOAL #2   Title  Patient will improve FGA to over 26/30 to indicate functional improvement in strength and balance limitations and decrease fall risk    Baseline  22/30; ; 09/01/2017: 24/30; 09/30/2017: 25/30; 11/03/2017: 25/30    Time  8    Period  Weeks    Status  On-going      PT LONG TERM GOAL #3   Title  Patient will improve 5xSTS to under 16 seconds without use of a pillow to indicate functional improvement of LE function and decreased fall risk    Baseline  19 sec with pillow under chair; 09/01/2017 12 seconds with use of a pillow; 09/30/2017: 10.9 sec with use of pillow; defered today secondary to increased soreness from walking the previous day    Time  8    Period  Weeks    Status  On-going      PT LONG TERM GOAL #4   Title  Patient will be able to balance for 10sec with SLS to improve static balance and ability to get dressed when in standing    Baseline  1 sec; 09/01/2017: 3 sec each leg; 09/30/17: 4.5; 11/03/2017: 5 sec    Time  8    Period  Weeks    Status  On-going            Plan - 11/29/17 1458    Clinical Impression Statement  Patient's HR was within 90 to 120 throuhgout session. Patient demonstrates improvement with performing exercises in standing indicating functional improvement. Although patient is improving she continues to have increased balance difficulties mostly when ambulating. Patient will benefit from further skilled therapy to return to prior level of function.     Rehab Potential  Good    Clinical Impairments Affecting Rehab Potential  Positive: motivation; Negative: , age    PT Frequency  2x / week    PT Duration  6 weeks    PT Treatment/Interventions  ADLs/Self Care Home Management;Aquatic Therapy;Biofeedback;DME Instruction;Gait training;Stair training;Functional mobility training;Therapeutic activities;Therapeutic exercise;Balance training;Neuromuscular re-education;Patient/family education;Passive range of  motion;Energy conservation;Ultrasound;Moist Heat;Iontophoresis 4mg /ml Dexamethasone;Cryotherapy;Electrical Stimulation;Manual techniques;Dry needling    PT Next Visit Plan  Progress balance and strengthening exercises,     PT Home Exercise Plan  See education section    Consulted and Agree with Plan of Care  Patient  Patient will benefit from skilled therapeutic intervention in order to improve the following deficits and impairments:  Pain, Increased fascial restricitons, Decreased coordination, Decreased mobility, Decreased activity tolerance, Decreased endurance, Decreased range of motion, Decreased strength, Hypomobility, Postural dysfunction, Abnormal gait, Decreased balance, Difficulty walking  Visit Diagnosis: Muscle weakness (generalized)  Unsteadiness on feet  History of falling     Problem List Patient Active Problem List   Diagnosis Date Noted  . Hypomagnesemia 09/29/2017  . Hypokalemia 09/29/2017  . Atrial fibrillation ([place]) 09/10/2017  . Trapezius strain, right, initial encounter 09/10/2017  . Depression, major, single episode, mild ([place]) 09/10/2017  . Chronic venous insufficiency 09/09/2017  . Lymphedema 09/09/2017  . Pain of toe of left foot 08/17/2017  . Grief 08/17/2017  . Epistaxis 07/31/2017  . Leg swelling 07/31/2017  . Dry eyes 07/06/2017  . Pain and swelling of lower extremity, right 03/29/2017  . Right shoulder pain 03/29/2017  . Diarrhea 05/18/2016  . Fall 05/01/2016  . Unintentional weight loss 12/30/2015  . Leg weakness, bilateral 12/30/2015  . Hyperkalemia 01/01/2015  . Occasional numbness/prickling/tingling of fingers and toes 12/18/2014  . Routine general medical examination at a health care facility 12/13/2013  . Macrocytic anemia 05/19/2013  . Psoriatic arthritis ([place]) 02/03/2013  . Psoriasis   . Osteoarthritis, multiple sites   . Episodic mood disorder ([place]) 12/01/2011  . History of breast cancer 10/28/2011  . GERD (gastroesophageal  reflux disease)   . Hyperlipidemia   . Hypertension   . Osteopenia   . Carotid stenosis 05/27/2011    Blythe Stanford, PT DPT 11/29/2017, 3:13 PM  [facility] [address], [place], [address] Phone: [phone]   Fax:  [phone]  Name: SAMAYA BOARDLEY MRN: [id number] Date of Birth: 17-May-1941

## 2017-11-29 NOTE — Telephone Encounter (Signed)
fyi

## 2017-11-30 NOTE — Telephone Encounter (Signed)
Patient notified

## 2017-11-30 NOTE — Telephone Encounter (Signed)
Last OV 09/29/17 last filled 10/20/17 30 0rf

## 2017-11-30 NOTE — Telephone Encounter (Signed)
Noted. If she feels worse again she needs to be re-evaluated.

## 2017-12-01 ENCOUNTER — Ambulatory Visit: Payer: Medicare Other

## 2017-12-01 DIAGNOSIS — R2681 Unsteadiness on feet: Secondary | ICD-10-CM

## 2017-12-01 DIAGNOSIS — M25511 Pain in right shoulder: Secondary | ICD-10-CM | POA: Diagnosis not present

## 2017-12-01 DIAGNOSIS — M6281 Muscle weakness (generalized): Secondary | ICD-10-CM | POA: Diagnosis not present

## 2017-12-01 DIAGNOSIS — Z9181 History of falling: Secondary | ICD-10-CM

## 2017-12-01 NOTE — Therapy (Signed)
[facility] [address], [place], [address] Phone: [phone]   Fax:  [phone]  Physical Therapy Treatment  Patient Details  Name: [name] MRN: [id number] Date of Birth: [birth year] Referring Provider: Caryl Bis MD   Encounter Date: 12/01/2017  PT End of Session - 12/01/17 1247    Visit Number  27    Number of Visits  32    Date for PT Re-Evaluation  12/15/17    Authorization Type  Progress note 5/10     PT Start Time  1030    PT Stop Time  1115    PT Time Calculation (min)  45 min    Equipment Utilized During Treatment  Gait belt    Activity Tolerance  Patient tolerated treatment well    Behavior During Therapy  [facility] for tasks assessed/performed       Past Medical History:  Diagnosis Date  . (HFpEF) heart failure with preserved ejection fraction ([place])    a. 08/2017 Echo: EF 55-60%, no rwma, mild MR, mildly dil LA, nl RV fxn.  . Breast cancer ([place]) 2001   left breast  . Cancer ([place]) 2001   left breast ca  . Carotid arterial disease ([place])    a. 10/2004 s/p L CEA; b. 12/2015 Carotid U/S: RICA 1-39%; b. LICA patent CEA site.  Marland Kitchen GERD (gastroesophageal reflux disease)   . Hyperlipidemia   . Hypertension   . Osteoarthritis, multiple sites   . Osteopenia   . Persistent atrial fibrillation ([place])    a. Dx 08/2017; b. CHA2DS2VASc = 6-->Xarelto; c. 09/2017 Successful DCCV (second shock - 200J);   Marland Kitchen Personal history of radiation therapy 2001   left breast ca  . Psoriasis     Past Surgical History:  Procedure Laterality Date  . ABDOMINAL HYSTERECTOMY    . ANKLE FRACTURE SURGERY  4/08   left---hardware still in place  . 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 BREAST EXCISIONAL BIOPSY Left yrs ago   benign  . BREAST LUMPECTOMY Left 2001   f/u radiation  . CARDIOVERSION N/A 10/04/2017   Procedure: CARDIOVERSION;  Surgeon: Wellington Hampshire, MD;  Location: [facility] ORS;  Service: Cardiovascular;   Laterality: N/A;  . CAROTID ENDARTERECTOMY Left 10/23/2004  . FRACTURE SURGERY    . OOPHORECTOMY    . SHOULDER SURGERY  6/07   left  . TONSILLECTOMY AND ADENOIDECTOMY    . TOTAL HIP ARTHROPLASTY  2004   right    There were no vitals filed for this visit.  Subjective Assessment - 12/01/17 1040    Subjective  Patient reports no major changes since the previous session. Patient reports she is seeing the cardiologist tomorrow and is excited to see her status.    Pertinent History  Patient previously seen for balance difficulties and shoulder pain and the right.  Patient reports no falls in the past six months. Patient reports pain has not improved for the past month and has been about the same since the onset of pain.     Limitations  Walking    How long can you sit comfortably?       Patient Stated Goals  To improve balance when walking     Currently in Pain?  No/denies       TREATMENT:   Therapeutic Exercise: Nustep step level 4 - x46min with use of arms and legs to improve strengthening  Turning on airex pad with a squat  Tandem walking on half foam roller with use of B UE support  Heel raises in standing - 2 x 20 off of half foam roller Agility ladder forward walking - 51ft x 4 forward, sideways 2 x 15 B, backwards 46ft x 2  Sit to stands with TRX straps - 2x 10 Standing on half foam balance - x 2 min   Patient demonstrates no pain throughout session    PT Education - 12/01/17 1247    Education provided  Yes    Education Details  form/technique with exercises    Person(s) Educated  Patient    Methods  Explanation;Demonstration    Comprehension  Verbalized understanding;Returned demonstration          PT Long Term Goals - 11/03/17 1046      PT LONG TERM GOAL #1   Title  Patient will be independent with HEP to continue benefits of therapy after discharge.     Baseline  Dependent with form and technique for balancing exercises; moderate cueing for balance and  strengthening    Time  4    Period  Weeks    Status  On-going      PT LONG TERM GOAL #2   Title  Patient will improve FGA to over 26/30 to indicate functional improvement in strength and balance limitations and decrease fall risk    Baseline  22/30; ; 09/01/2017: 24/30; 09/30/2017: 25/30; 11/03/2017: 25/30    Time  8    Period  Weeks    Status  On-going      PT LONG TERM GOAL #3   Title  Patient will improve 5xSTS to under 16 seconds without use of a pillow to indicate functional improvement of LE function and decreased fall risk    Baseline  19 sec with pillow under chair; 09/01/2017 12 seconds with use of a pillow; 09/30/2017: 10.9 sec with use of pillow; defered today secondary to increased soreness from walking the previous day    Time  8    Period  Weeks    Status  On-going      PT LONG TERM GOAL #4   Title  Patient will be able to balance for 10sec with SLS to improve static balance and ability to get dressed when in standing    Baseline  1 sec; 09/01/2017: 3 sec each leg; 09/30/17: 4.5; 11/03/2017: 5 sec    Time  8    Period  Weeks    Status  On-going            Plan - 12/01/17 1249    Clinical Impression Statement  Patient states improvement in standing balance today requiring less UE support compared to previous sessions. Patient demonstrates decreased balance with dynamic balance most notably with less UE support. Patient will benefit from further skilled therapy to return to prior level of function.     Rehab Potential  Good    Clinical Impairments Affecting Rehab Potential  Positive: motivation; Negative: , age    PT Frequency  2x / week    PT Duration  6 weeks    PT Treatment/Interventions  ADLs/Self Care Home Management;Aquatic Therapy;Biofeedback;DME Instruction;Gait training;Stair training;Functional mobility training;Therapeutic activities;Therapeutic exercise;Balance training;Neuromuscular re-education;Patient/family education;Passive range of motion;Energy  conservation;Ultrasound;Moist Heat;Iontophoresis 4mg /ml Dexamethasone;Cryotherapy;Electrical Stimulation;Manual techniques;Dry needling    PT Next Visit Plan  Progress balance and strengthening exercises,     PT Home Exercise Plan  See education section    Consulted and Agree with Plan of Care  Patient  Patient will benefit from skilled therapeutic intervention in order to improve the following deficits and impairments:  Pain, Increased fascial restricitons, Decreased coordination, Decreased mobility, Decreased activity tolerance, Decreased endurance, Decreased range of motion, Decreased strength, Hypomobility, Postural dysfunction, Abnormal gait, Decreased balance, Difficulty walking  Visit Diagnosis: Muscle weakness (generalized)  Unsteadiness on feet  History of falling     Problem List Patient Active Problem List   Diagnosis Date Noted  . Hypomagnesemia 09/29/2017  . Hypokalemia 09/29/2017  . Atrial fibrillation ([place]) 09/10/2017  . Trapezius strain, right, initial encounter 09/10/2017  . Depression, major, single episode, mild ([place]) 09/10/2017  . Chronic venous insufficiency 09/09/2017  . Lymphedema 09/09/2017  . Pain of toe of left foot 08/17/2017  . Grief 08/17/2017  . Epistaxis 07/31/2017  . Leg swelling 07/31/2017  . Dry eyes 07/06/2017  . Pain and swelling of lower extremity, right 03/29/2017  . Right shoulder pain 03/29/2017  . Diarrhea 05/18/2016  . Fall 05/01/2016  . Unintentional weight loss 12/30/2015  . Leg weakness, bilateral 12/30/2015  . Hyperkalemia 01/01/2015  . Occasional numbness/prickling/tingling of fingers and toes 12/18/2014  . Routine general medical examination at a health care facility 12/13/2013  . Macrocytic anemia 05/19/2013  . Psoriatic arthritis ([place]) 02/03/2013  . Psoriasis   . Osteoarthritis, multiple sites   . Episodic mood disorder ([place]) 12/01/2011  . History of breast cancer 10/28/2011  . GERD (gastroesophageal reflux  disease)   . Hyperlipidemia   . Hypertension   . Osteopenia   . Carotid stenosis 05/27/2011    Blythe Stanford, PT DPT 12/01/2017, 12:53 PM  [facility] [address], [place], [address] Phone: [phone]   Fax:  [phone]  Name: [name] MRN: [id number] Date of Birth: 06/06/1941

## 2017-12-02 ENCOUNTER — Ambulatory Visit (INDEPENDENT_AMBULATORY_CARE_PROVIDER_SITE_OTHER): Payer: Medicare Other | Admitting: Internal Medicine

## 2017-12-02 ENCOUNTER — Encounter: Payer: Self-pay | Admitting: Internal Medicine

## 2017-12-02 VITALS — BP 120/82 | HR 118 | Ht 66.5 in | Wt 121.8 lb

## 2017-12-02 DIAGNOSIS — I481 Persistent atrial fibrillation: Secondary | ICD-10-CM

## 2017-12-02 DIAGNOSIS — I4819 Other persistent atrial fibrillation: Secondary | ICD-10-CM

## 2017-12-02 MED ORDER — FLECAINIDE ACETATE 50 MG PO TABS: 50.0000 mg | ORAL_TABLET | Freq: Two times a day (BID) | ORAL | 3 refills | Status: DC

## 2017-12-02 NOTE — H&P (View-Only) (Signed)
Patient Care Team: [name], MD as PCP - General (Family Medicine) [name], MD as PCP - Cardiology (Cardiology)   HPI  [name] is a 77 y.o. female Seen in follow-up for recent hospitalization having presented with acute shortness of breath and found to be in rapid atrial fibrillation.  Rate control was accomplished with beta-blockers and calcium blockers and anticoagulation with Xarelto.  She has had some persistent peripheral edema.  Chronic shortness of breath and exercise intolerance.  She underwent cardioversion without significant improvement and it was found on follow-up to be in atrial fibrillation   She is [name] his wife.  Son, [name], is an unreturned prodigal.  DATE TEST EF   2/19 Echo   65 % LAE (48/2.8/46)         Date Cr K Hgb  2/19 0.8 3.9 12.5  5/19  1.12 3.8     Thromboembolic risk factors ( age  -2, HTN-1, Vasc disease -1, Gender-1) for a CHADSVASc Score of 5  Records and Results Reviewed hosp records  Past Medical History:  Diagnosis Date  . (HFpEF) heart failure with preserved ejection fraction ([place])    a. 08/2017 Echo: EF 55-60%, no rwma, mild MR, mildly dil LA, nl RV fxn.  . Breast cancer ([place]) 2001   left breast  . Cancer ([place]) 2001   left breast ca  . Carotid arterial disease ([place])    a. 10/2004 s/p L CEA; b. 12/2015 Carotid U/S: RICA 1-39%; b. LICA patent CEA site.  Marland Kitchen GERD (gastroesophageal reflux disease)   . Hyperlipidemia   . Hypertension   . Osteoarthritis, multiple sites   . Osteopenia   . Persistent atrial fibrillation ([place])    a. Dx 08/2017; b. CHA2DS2VASc = 6-->Xarelto; c. 09/2017 Successful DCCV (second shock - 200J);   Marland Kitchen Personal history of radiation therapy 2001   left breast ca  . Psoriasis     Past Surgical History:  Procedure Laterality Date  . ABDOMINAL HYSTERECTOMY    . ANKLE FRACTURE SURGERY  4/08   left---hardware still in place  . 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 BREAST  EXCISIONAL BIOPSY Left yrs ago   benign  . BREAST LUMPECTOMY Left 2001   f/u radiation  . CARDIOVERSION N/A 10/04/2017   Procedure: CARDIOVERSION;  Surgeon: Wellington Hampshire, MD;  Location: [facility] ORS;  Service: Cardiovascular;  Laterality: N/A;  . CAROTID ENDARTERECTOMY Left 10/23/2004  . FRACTURE SURGERY    . OOPHORECTOMY    . SHOULDER SURGERY  6/07   left  . TONSILLECTOMY AND ADENOIDECTOMY    . TOTAL HIP ARTHROPLASTY  2004   right    Current Outpatient Medications  Medication Sig Dispense Refill  . Cholecalciferol (VITAMIN D3) 1000 units CAPS Take 1 capsule by mouth 2 (two) times daily.     Marland Kitchen diltiazem (CARDIZEM CD) 120 MG 24 hr capsule Take 1 capsule (120 mg total) by mouth daily. 30 capsule 3  . furosemide (LASIX) 40 MG tablet Take 1 tablet (40 mg total) by mouth daily. 30 tablet 3  . LORazepam (ATIVAN) 0.5 MG tablet TAKE 1 TABLET BY MOUTH 2 TIMES DAILY AS NEEDED FOR ANXIETY 60 tablet 0  . metoprolol tartrate (LOPRESSOR) 50 MG tablet Take 1 tablet (50 mg total) by mouth 2 (two) times daily. 180 tablet 3  . mirtazapine (REMERON) 15 MG tablet TAKE 1 TABLET (15 MG TOTAL) BY MOUTH AT BEDTIME. 30 tablet 3  .  Multiple Vitamin (MULTIVITAMIN) capsule Take 1 capsule by mouth daily.      . Multiple Vitamins-Minerals (PRESERVISION AREDS 2) CAPS Take 1 tablet by mouth 2 (two) times daily.    . Rivaroxaban (XARELTO) 15 MG TABS tablet Take 1 tablet (15 mg total) by mouth daily with supper. 30 tablet 5   No current facility-administered medications for this visit.     No Known Allergies    Review of Systems negative except from HPI and PMH  Physical Exam BP 120/82 (BP Location: Left Arm, Patient Position: Sitting, Cuff Size: Normal)   Pulse (!) 118   Ht 5' 6.5" (1.689 m)   Wt 121 lb 12 oz (55.2 kg)   BMI 19.36 kg/m  Well developed and nourished in no acute distress HENT normal Neck supple with JVP-flat Carotids brisk and full without bruits Clear Irregularly irregular rate and  rhythm withrapid ventricular response, no murmurs or gallops Abd-soft with active BS without hepatomegaly No Clubbing cyanosis edema Skin-warm and dry A & Oriented  Grossly normal sensory and motor function   ECG personally reviewed atrial fibrillation at 118 Assessment and  Plan  Atrial fibrillation-persistent with a rapid rate  HFpEF-acute on chronic  Grief  Peripheral vascular disease carotid artery occlusion  Euvolemic continue current meds  Still with significant fatigue.  It is not clear whether sinus will be better for her not although I suspect it well.  We have discussed antiarrhythmic therapy and repeated cardioversion.  We will start her on flecainide.  She has known carotid disease but no known coronary disease.  Discussed life post death of her husband,  Her son remains gone   She is struggling with cost for NOACs.  We will reach out to the companies.  We spent more than 50% of our >25 min visit in face to face counseling regarding the above

## 2017-12-02 NOTE — Progress Notes (Signed)
Patient Care Team: [name], MD as PCP - General (Family Medicine) [name], MD as PCP - Cardiology (Cardiology)   HPI  [name] is a 77 y.o. female Seen in follow-up for recent hospitalization having presented with acute shortness of breath and found to be in rapid atrial fibrillation.  Rate control was accomplished with beta-blockers and calcium blockers and anticoagulation with Xarelto.  She has had some persistent peripheral edema.  Chronic shortness of breath and exercise intolerance.  She underwent cardioversion without significant improvement and it was found on follow-up to be in atrial fibrillation   She is [name] his wife.  Son, [name], is an unreturned prodigal.  DATE TEST EF   2/19 Echo   65 % LAE (48/2.8/46)         Date Cr K Hgb  2/19 0.8 3.9 12.5  5/19  1.12 3.8     Thromboembolic risk factors ( age  -2, HTN-1, Vasc disease -1, Gender-1) for a CHADSVASc Score of 5  Records and Results Reviewed hosp records  Past Medical History:  Diagnosis Date  . (HFpEF) heart failure with preserved ejection fraction ([place])    a. 08/2017 Echo: EF 55-60%, no rwma, mild MR, mildly dil LA, nl RV fxn.  . Breast cancer ([place]) 2001   left breast  . Cancer ([place]) 2001   left breast ca  . Carotid arterial disease ([place])    a. 10/2004 s/p L CEA; b. 12/2015 Carotid U/S: RICA 1-39%; b. LICA patent CEA site.  Marland Kitchen GERD (gastroesophageal reflux disease)   . Hyperlipidemia   . Hypertension   . Osteoarthritis, multiple sites   . Osteopenia   . Persistent atrial fibrillation ([place])    a. Dx 08/2017; b. CHA2DS2VASc = 6-->Xarelto; c. 09/2017 Successful DCCV (second shock - 200J);   Marland Kitchen Personal history of radiation therapy 2001   left breast ca  . Psoriasis     Past Surgical History:  Procedure Laterality Date  . ABDOMINAL HYSTERECTOMY    . ANKLE FRACTURE SURGERY  4/08   left---hardware still in place  . 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 BREAST  EXCISIONAL BIOPSY Left yrs ago   benign  . BREAST LUMPECTOMY Left 2001   f/u radiation  . CARDIOVERSION N/A 10/04/2017   Procedure: CARDIOVERSION;  Surgeon: Wellington Hampshire, MD;  Location: [facility] ORS;  Service: Cardiovascular;  Laterality: N/A;  . CAROTID ENDARTERECTOMY Left 10/23/2004  . FRACTURE SURGERY    . OOPHORECTOMY    . SHOULDER SURGERY  6/07   left  . TONSILLECTOMY AND ADENOIDECTOMY    . TOTAL HIP ARTHROPLASTY  2004   right    Current Outpatient Medications  Medication Sig Dispense Refill  . Cholecalciferol (VITAMIN D3) 1000 units CAPS Take 1 capsule by mouth 2 (two) times daily.     Marland Kitchen diltiazem (CARDIZEM CD) 120 MG 24 hr capsule Take 1 capsule (120 mg total) by mouth daily. 30 capsule 3  . furosemide (LASIX) 40 MG tablet Take 1 tablet (40 mg total) by mouth daily. 30 tablet 3  . LORazepam (ATIVAN) 0.5 MG tablet TAKE 1 TABLET BY MOUTH 2 TIMES DAILY AS NEEDED FOR ANXIETY 60 tablet 0  . metoprolol tartrate (LOPRESSOR) 50 MG tablet Take 1 tablet (50 mg total) by mouth 2 (two) times daily. 180 tablet 3  . mirtazapine (REMERON) 15 MG tablet TAKE 1 TABLET (15 MG TOTAL) BY MOUTH AT BEDTIME. 30 tablet 3  .  Multiple Vitamin (MULTIVITAMIN) capsule Take 1 capsule by mouth daily.      . Multiple Vitamins-Minerals (PRESERVISION AREDS 2) CAPS Take 1 tablet by mouth 2 (two) times daily.    . Rivaroxaban (XARELTO) 15 MG TABS tablet Take 1 tablet (15 mg total) by mouth daily with supper. 30 tablet 5   No current facility-administered medications for this visit.     No Known Allergies    Review of Systems negative except from HPI and PMH  Physical Exam BP 120/82 (BP Location: Left Arm, Patient Position: Sitting, Cuff Size: Normal)   Pulse (!) 118   Ht 5' 6.5" (1.689 m)   Wt 121 lb 12 oz (55.2 kg)   BMI 19.36 kg/m  Well developed and nourished in no acute distress HENT normal Neck supple with JVP-flat Carotids brisk and full without bruits Clear Irregularly irregular rate and  rhythm withrapid ventricular response, no murmurs or gallops Abd-soft with active BS without hepatomegaly No Clubbing cyanosis edema Skin-warm and dry A & Oriented  Grossly normal sensory and motor function   ECG personally reviewed atrial fibrillation at 118 Assessment and  Plan  Atrial fibrillation-persistent with a rapid rate  HFpEF-acute on chronic  Grief  Peripheral vascular disease carotid artery occlusion  Euvolemic continue current meds  Still with significant fatigue.  It is not clear whether sinus will be better for her not although I suspect it well.  We have discussed antiarrhythmic therapy and repeated cardioversion.  We will start her on flecainide.  She has known carotid disease but no known coronary disease.  Discussed life post death of her husband,  Her son remains gone   She is struggling with cost for NOACs.  We will reach out to the companies.  We spent more than 50% of our >25 min visit in face to face counseling regarding the above

## 2017-12-02 NOTE — Patient Instructions (Signed)
Medication Instructions: - Your physician has recommended you make the following change in your medication: 1) START flecainide 50 mg- take 1 tablet (50 mg) by mouth TWICE daily  Xarelto samples given today Lot:[id number] Exp: 1/21 # 4 bottles  You have also been given an Eliquis card to call the company and discuss coverage information  Labwork: - none ordered  Procedures/Testing: - Your physician has recommended that you have a Cardioversion (DCCV). Electrical Cardioversion uses a jolt of electricity to your heart either through paddles or wired patches attached to your chest. This is a controlled, usually prescheduled, procedure. Defibrillation is done under light anesthesia in the hospital, and you usually go home the day of the procedure. This is done to get your heart back into a normal rhythm. You are not awake for the procedure.  You are scheduled for a Cardioversion on ________________ with Dr.___________ Please arrive at the [facility] at _________ a.m. on the day of your procedure.  DIET INSTRUCTIONS:  Nothing to eat or drink after midnight except your medications with a              sip of water.         1) Labs: __________________  2) Medications:  YOU MAY TAKE ALL of your remaining medications with a small amount of water.  3) Must have a responsible person to drive you home.  4) Bring a current list of your medications and current insurance cards.    If you have any questions after you get home, please call the office at [phone]   Follow-Up: - pending date of your cardioversion   Any Additional Special Instructions Will Be Listed Below (If Applicable).     If you need a refill on your cardiac medications before your next appointment, please call your pharmacy.

## 2017-12-06 ENCOUNTER — Ambulatory Visit: Payer: Medicare Other

## 2017-12-06 DIAGNOSIS — Z9181 History of falling: Secondary | ICD-10-CM | POA: Diagnosis not present

## 2017-12-06 DIAGNOSIS — R2681 Unsteadiness on feet: Secondary | ICD-10-CM

## 2017-12-06 DIAGNOSIS — M6281 Muscle weakness (generalized): Secondary | ICD-10-CM | POA: Diagnosis not present

## 2017-12-06 DIAGNOSIS — M25511 Pain in right shoulder: Secondary | ICD-10-CM | POA: Diagnosis not present

## 2017-12-06 NOTE — Therapy (Signed)
[facility] [address], [place], [address] Phone: [phone]   Fax:  [phone]  Physical Therapy Treatment  Patient Details  Name: [name] MRN: [id number] Date of Birth: November 04, 1940 Referring Provider: Caryl Bis MD   Encounter Date: 12/06/2017  PT End of Session - 12/06/17 1316    Visit Number  28    Number of Visits  32    Date for PT Re-Evaluation  12/15/17    Authorization Type  Progress note 6/10     PT Start Time  1310    PT Stop Time  1345    PT Time Calculation (min)  35 min    Equipment Utilized During Treatment  Gait belt    Activity Tolerance  Patient tolerated treatment well    Behavior During Therapy  [facility] for tasks assessed/performed       Past Medical History:  Diagnosis Date  . (HFpEF) heart failure with preserved ejection fraction ([place])    a. 08/2017 Echo: EF 55-60%, no rwma, mild MR, mildly dil LA, nl RV fxn.  . Breast cancer ([place]) 2001   left breast  . Cancer ([place]) 2001   left breast ca  . Carotid arterial disease ([place])    a. 10/2004 s/p L CEA; b. 12/2015 Carotid U/S: RICA 1-39%; b. LICA patent CEA site.  Marland Kitchen GERD (gastroesophageal reflux disease)   . Hyperlipidemia   . Hypertension   . Osteoarthritis, multiple sites   . Osteopenia   . Persistent atrial fibrillation ([place])    a. Dx 08/2017; b. CHA2DS2VASc = 6-->Xarelto; c. 09/2017 Successful DCCV (second shock - 200J);   Marland Kitchen Personal history of radiation therapy 2001   left breast ca  . Psoriasis     Past Surgical History:  Procedure Laterality Date  . ABDOMINAL HYSTERECTOMY    . ANKLE FRACTURE SURGERY  4/08   left---hardware still in place  . 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 BREAST EXCISIONAL BIOPSY Left yrs ago   benign  . BREAST LUMPECTOMY Left 2001   f/u radiation  . CARDIOVERSION N/A 10/04/2017   Procedure: CARDIOVERSION;  Surgeon: Wellington Hampshire, MD;  Location: [facility] ORS;  Service: Cardiovascular;   Laterality: N/A;  . CAROTID ENDARTERECTOMY Left 10/23/2004  . FRACTURE SURGERY    . OOPHORECTOMY    . SHOULDER SURGERY  6/07   left  . TONSILLECTOMY AND ADENOIDECTOMY    . TOTAL HIP ARTHROPLASTY  2004   right    There were no vitals filed for this visit.  Subjective Assessment - 12/06/17 1313    Subjective  Patient reports she saw the cardiologist who has recommended another cardioversion to help get out of a fib. Patient states she did some gardening over the weekend.     Pertinent History  Patient previously seen for balance difficulties and shoulder pain and the right.  Patient reports no falls in the past six months. Patient reports pain has not improved for the past month and has been about the same since the onset of pain.     Limitations  Walking    How long can you sit comfortably?       Patient Stated Goals  To improve balance when walking     Currently in Pain?  No/denies       TREATMENT:   Therapeutic Exercise: Nustep step level 5 - x43min with use of arms and legs to improve strengthening  Hip abduction on  airex beam - 2 x 12 Hip extension on airex beam - 2 x 12 Tandem walking on half foam roller with use of B UE support - x 8 down and back Side stepping across airex beam - x 10 R and L  Step ups onto 6" step --  2 x 8 B  Backward tandem ambulation - 4 x 1ft Heel raises in standing - 2 x 20 off of half foam roller Heel/toe raises with wobbleboard in standing - x20 Feet on balance stones static balance - x 20   Patient demonstrates no pain throughout session   PT Education - 12/06/17 1316    Education provided  Yes    Education Details  form/technique with exercises    Person(s) Educated  Patient    Methods  Explanation;Demonstration    Comprehension  Verbalized understanding;Returned demonstration          PT Long Term Goals - 11/03/17 1046      PT LONG TERM GOAL #1   Title  Patient will be independent with HEP to continue benefits of therapy after  discharge.     Baseline  Dependent with form and technique for balancing exercises; moderate cueing for balance and strengthening    Time  4    Period  Weeks    Status  On-going      PT LONG TERM GOAL #2   Title  Patient will improve FGA to over 26/30 to indicate functional improvement in strength and balance limitations and decrease fall risk    Baseline  22/30; ; 09/01/2017: 24/30; 09/30/2017: 25/30; 11/03/2017: 25/30    Time  8    Period  Weeks    Status  On-going      PT LONG TERM GOAL #3   Title  Patient will improve 5xSTS to under 16 seconds without use of a pillow to indicate functional improvement of LE function and decreased fall risk    Baseline  19 sec with pillow under chair; 09/01/2017 12 seconds with use of a pillow; 09/30/2017: 10.9 sec with use of pillow; defered today secondary to increased soreness from walking the previous day    Time  8    Period  Weeks    Status  On-going      PT LONG TERM GOAL #4   Title  Patient will be able to balance for 10sec with SLS to improve static balance and ability to get dressed when in standing    Baseline  1 sec; 09/01/2017: 3 sec each leg; 09/30/17: 4.5; 11/03/2017: 5 sec    Time  8    Period  Weeks    Status  On-going            Plan - 12/06/17 1339    Clinical Impression Statement  Patient demonstrates improvement in standing balance but fatigues quickly with exercises. Patient demonstrates decreased ability to perform higher repetitions of resistive exercises secondary to faitgue and requires greater amount of standing rest breaks. Patient will benefit from further skilled therapy to return to prior level of function.     Rehab Potential  Good    Clinical Impairments Affecting Rehab Potential  Positive: motivation; Negative: , age    PT Frequency  2x / week    PT Duration  6 weeks    PT Treatment/Interventions  ADLs/Self Care Home Management;Aquatic Therapy;Biofeedback;DME Instruction;Gait training;Stair training;Functional  mobility training;Therapeutic activities;Therapeutic exercise;Balance training;Neuromuscular re-education;Patient/family education;Passive range of motion;Energy conservation;Ultrasound;Moist Heat;Iontophoresis 4mg /ml Dexamethasone;Cryotherapy;Electrical Stimulation;Manual techniques;Dry needling  PT Next Visit Plan  Progress balance and strengthening exercises,     PT Home Exercise Plan  See education section    Consulted and Agree with Plan of Care  Patient       Patient will benefit from skilled therapeutic intervention in order to improve the following deficits and impairments:  Pain, Increased fascial restricitons, Decreased coordination, Decreased mobility, Decreased activity tolerance, Decreased endurance, Decreased range of motion, Decreased strength, Hypomobility, Postural dysfunction, Abnormal gait, Decreased balance, Difficulty walking  Visit Diagnosis: Muscle weakness (generalized)  Unsteadiness on feet  History of falling     Problem List Patient Active Problem List   Diagnosis Date Noted  . Hypomagnesemia 09/29/2017  . Hypokalemia 09/29/2017  . Atrial fibrillation ([place]) 09/10/2017  . Trapezius strain, right, initial encounter 09/10/2017  . Depression, major, single episode, mild ([place]) 09/10/2017  . Chronic venous insufficiency 09/09/2017  . Lymphedema 09/09/2017  . Pain of toe of left foot 08/17/2017  . Grief 08/17/2017  . Epistaxis 07/31/2017  . Leg swelling 07/31/2017  . Dry eyes 07/06/2017  . Pain and swelling of lower extremity, right 03/29/2017  . Right shoulder pain 03/29/2017  . Diarrhea 05/18/2016  . Fall 05/01/2016  . Unintentional weight loss 12/30/2015  . Leg weakness, bilateral 12/30/2015  . Hyperkalemia 01/01/2015  . Occasional numbness/prickling/tingling of fingers and toes 12/18/2014  . Routine general medical examination at a health care facility 12/13/2013  . Macrocytic anemia 05/19/2013  . Psoriatic arthritis ([place]) 02/03/2013  . Psoriasis    . Osteoarthritis, multiple sites   . Episodic mood disorder ([place]) 12/01/2011  . History of breast cancer 10/28/2011  . GERD (gastroesophageal reflux disease)   . Hyperlipidemia   . Hypertension   . Osteopenia   . Carotid stenosis 05/27/2011    Blythe Stanford, PT DPT 12/06/2017, 1:52 PM  [facility] [address] [place], [address] Phone: [phone]   Fax:  [phone]  Name: [name] MRN: [id number] Date of Birth: [birth year]

## 2017-12-07 ENCOUNTER — Telehealth: Payer: Self-pay | Admitting: Internal Medicine

## 2017-12-07 NOTE — Telephone Encounter (Signed)
LVM for return call. 

## 2017-12-07 NOTE — Telephone Encounter (Signed)
Pt is a [place] pt. Will forward to [employer] triage/Heather, RN.

## 2017-12-07 NOTE — Telephone Encounter (Signed)
New Message:      Pt is calling to see when her procedure will be made. Pt states the last time she was in the office she said she was told it was supposed to be scheduled in the next two weeks.

## 2017-12-08 ENCOUNTER — Ambulatory Visit: Payer: Medicare Other

## 2017-12-08 DIAGNOSIS — M6281 Muscle weakness (generalized): Secondary | ICD-10-CM

## 2017-12-08 DIAGNOSIS — R2681 Unsteadiness on feet: Secondary | ICD-10-CM | POA: Diagnosis not present

## 2017-12-08 DIAGNOSIS — M25511 Pain in right shoulder: Secondary | ICD-10-CM | POA: Diagnosis not present

## 2017-12-08 DIAGNOSIS — Z9181 History of falling: Secondary | ICD-10-CM

## 2017-12-08 NOTE — Therapy (Signed)
[facility] [address] [place], [address] Phone: [phone]   Fax:  [phone]  Physical Therapy Treatment  Patient Details  Name: [name] MRN: [id number] Date of Birth: Nov 11, 1940 Referring Provider: Caryl Bis MD   Encounter Date: 12/08/2017  PT End of Session - 12/08/17 1046    Visit Number  29    Number of Visits  32    Date for PT Re-Evaluation  12/15/17    Authorization Type  Progress note 7/10     PT Start Time  1030    PT Stop Time  1115    PT Time Calculation (min)  45 min    Equipment Utilized During Treatment  Gait belt    Activity Tolerance  Patient tolerated treatment well    Behavior During Therapy  [facility] for tasks assessed/performed       Past Medical History:  Diagnosis Date  . (HFpEF) heart failure with preserved ejection fraction ([place])    a. 08/2017 Echo: EF 55-60%, no rwma, mild MR, mildly dil LA, nl RV fxn.  . Breast cancer ([place]) 2001   left breast  . Cancer ([place]) 2001   left breast ca  . Carotid arterial disease ([place])    a. 10/2004 s/p L CEA; b. 12/2015 Carotid U/S: RICA 1-39%; b. LICA patent CEA site.  Marland Kitchen GERD (gastroesophageal reflux disease)   . Hyperlipidemia   . Hypertension   . Osteoarthritis, multiple sites   . Osteopenia   . Persistent atrial fibrillation ([place])    a. Dx 08/2017; b. CHA2DS2VASc = 6-->Xarelto; c. 09/2017 Successful DCCV (second shock - 200J);   Marland Kitchen Personal history of radiation therapy 2001   left breast ca  . Psoriasis     Past Surgical History:  Procedure Laterality Date  . ABDOMINAL HYSTERECTOMY    . ANKLE FRACTURE SURGERY  4/08   left---hardware still in place  . 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 BREAST EXCISIONAL BIOPSY Left yrs ago   benign  . BREAST LUMPECTOMY Left 2001   f/u radiation  . CARDIOVERSION N/A 10/04/2017   Procedure: CARDIOVERSION;  Surgeon: Wellington Hampshire, MD;  Location: [facility] ORS;  Service: Cardiovascular;   Laterality: N/A;  . CAROTID ENDARTERECTOMY Left 10/23/2004  . FRACTURE SURGERY    . OOPHORECTOMY    . SHOULDER SURGERY  6/07   left  . TONSILLECTOMY AND ADENOIDECTOMY    . TOTAL HIP ARTHROPLASTY  2004   right    There were no vitals filed for this visit.  Subjective Assessment - 12/08/17 1037    Subjective  Patient reports no major changes since the previous session and states she is waiting to be scheduled for her a - fib.     Pertinent History  Patient previously seen for balance difficulties and shoulder pain and the right.  Patient reports no falls in the past six months. Patient reports pain has not improved for the past month and has been about the same since the onset of pain.     Limitations  Walking    How long can you sit comfortably?       Patient Stated Goals  To improve balance when walking     Currently in Pain?  No/denies       TREATMENT:   Therapeutic Exercise: Nustep step level 5 - x36min with use of arms and legs to improve strengthening  Side stepping across airex beam - x 10  R and L Hip abduction off of airex pad - x 20 Turning with performing squats on airex pad - x 20  Feet on balance stones static balance - x 20 Walking straddling airex beam with RTB around knees - x 10 forward/backward Heel/toe raises with half foam in standing - x20 Side stepping along balance stones with UE support - x 10 (four stones)   Patient demonstrates increased fatigue at the end of the session    PT Education - 12/08/17 1039    Education provided  Yes    Education Details  form/technique with exercises    Person(s) Educated  Patient    Methods  Explanation;Demonstration    Comprehension  Verbalized understanding;Returned demonstration          PT Long Term Goals - 11/03/17 1046      PT LONG TERM GOAL #1   Title  Patient will be independent with HEP to continue benefits of therapy after discharge.     Baseline  Dependent with form and technique for balancing  exercises; moderate cueing for balance and strengthening    Time  4    Period  Weeks    Status  On-going      PT LONG TERM GOAL #2   Title  Patient will improve FGA to over 26/30 to indicate functional improvement in strength and balance limitations and decrease fall risk    Baseline  22/30; ; 09/01/2017: 24/30; 09/30/2017: 25/30; 11/03/2017: 25/30    Time  8    Period  Weeks    Status  On-going      PT LONG TERM GOAL #3   Title  Patient will improve 5xSTS to under 16 seconds without use of a pillow to indicate functional improvement of LE function and decreased fall risk    Baseline  19 sec with pillow under chair; 09/01/2017 12 seconds with use of a pillow; 09/30/2017: 10.9 sec with use of pillow; defered today secondary to increased soreness from walking the previous day    Time  8    Period  Weeks    Status  On-going      PT LONG TERM GOAL #4   Title  Patient will be able to balance for 10sec with SLS to improve static balance and ability to get dressed when in standing    Baseline  1 sec; 09/01/2017: 3 sec each leg; 09/30/17: 4.5; 11/03/2017: 5 sec    Time  8    Period  Weeks    Status  On-going            Plan - 12/08/17 1100    Clinical Impression Statement  Continued to advance exercises today and patient requires less standing rest breaks compared to previous sessions indicating improvement with fatigue levels. Although patient is imprving, she continues to demonstrate increased fatigue with exercises and increased postural sway indicating poor static balance. Patient will benefit from further skilled therapy to return to prior level of function.     Rehab Potential  Good    Clinical Impairments Affecting Rehab Potential  Positive: motivation; Negative: , age    PT Frequency  2x / week    PT Duration  6 weeks    PT Treatment/Interventions  ADLs/Self Care Home Management;Aquatic Therapy;Biofeedback;DME Instruction;Gait training;Stair training;Functional mobility  training;Therapeutic activities;Therapeutic exercise;Balance training;Neuromuscular re-education;Patient/family education;Passive range of motion;Energy conservation;Ultrasound;Moist Heat;Iontophoresis 4mg /ml Dexamethasone;Cryotherapy;Electrical Stimulation;Manual techniques;Dry needling    PT Next Visit Plan  Progress balance and strengthening exercises,     PT  Home Exercise Plan  See education section    Consulted and Agree with Plan of Care  Patient       Patient will benefit from skilled therapeutic intervention in order to improve the following deficits and impairments:  Pain, Increased fascial restricitons, Decreased coordination, Decreased mobility, Decreased activity tolerance, Decreased endurance, Decreased range of motion, Decreased strength, Hypomobility, Postural dysfunction, Abnormal gait, Decreased balance, Difficulty walking  Visit Diagnosis: Muscle weakness (generalized)  Unsteadiness on feet  History of falling     Problem List Patient Active Problem List   Diagnosis Date Noted  . Hypomagnesemia 09/29/2017  . Hypokalemia 09/29/2017  . Atrial fibrillation ([place]) 09/10/2017  . Trapezius strain, right, initial encounter 09/10/2017  . Depression, major, single episode, mild ([place]) 09/10/2017  . Chronic venous insufficiency 09/09/2017  . Lymphedema 09/09/2017  . Pain of toe of left foot 08/17/2017  . Grief 08/17/2017  . Epistaxis 07/31/2017  . Leg swelling 07/31/2017  . Dry eyes 07/06/2017  . Pain and swelling of lower extremity, right 03/29/2017  . Right shoulder pain 03/29/2017  . Diarrhea 05/18/2016  . Fall 05/01/2016  . Unintentional weight loss 12/30/2015  . Leg weakness, bilateral 12/30/2015  . Hyperkalemia 01/01/2015  . Occasional numbness/prickling/tingling of fingers and toes 12/18/2014  . Routine general medical examination at a health care facility 12/13/2013  . Macrocytic anemia 05/19/2013  . Psoriatic arthritis ([place]) 02/03/2013  . Psoriasis   .  Osteoarthritis, multiple sites   . Episodic mood disorder ([place]) 12/01/2011  . History of breast cancer 10/28/2011  . GERD (gastroesophageal reflux disease)   . Hyperlipidemia   . Hypertension   . Osteopenia   . Carotid stenosis 05/27/2011    Blythe Stanford, PT DPT 12/08/2017, 12:46 PM  [facility] [address] [place], [address] Phone: [phone]   Fax:  [phone]  Name: [name] MRN: [id number] Date of Birth: 07-02-1941

## 2017-12-08 NOTE — Telephone Encounter (Signed)
Spoke with patient and reviewed that they are working on arrangements for her procedure and that Nira Conn will be back tomorrow. She verbalized understanding, was appreciative for the call, and had no further questions at this time.

## 2017-12-09 ENCOUNTER — Ambulatory Visit (INDEPENDENT_AMBULATORY_CARE_PROVIDER_SITE_OTHER): Payer: Self-pay | Admitting: Vascular Surgery

## 2017-12-09 ENCOUNTER — Telehealth: Payer: Self-pay | Admitting: Internal Medicine

## 2017-12-09 ENCOUNTER — Encounter (INDEPENDENT_AMBULATORY_CARE_PROVIDER_SITE_OTHER): Payer: Self-pay

## 2017-12-09 NOTE — Telephone Encounter (Signed)
I left a message for the patient to call. 

## 2017-12-09 NOTE — Telephone Encounter (Signed)
Previous phone encounter already open for the same issue. Will close this encounter.  I will be calling the patient today to schedule her Cardioversion.

## 2017-12-09 NOTE — Telephone Encounter (Signed)
I spoke with the patient regarding scheduling her cardioversion next week. She would prefer to go on Friday 12/17/17. This has been scheduled with Dr. Fletcher Anon for a 7:30 am case. The patient is aware to arrive at 6:30 am. NPO after midnight. She can take her medications that morning except for her lasix. She is aware she will need someone to drive her home.

## 2017-12-09 NOTE — Telephone Encounter (Signed)
Pt returning our call  ° °

## 2017-12-09 NOTE — Telephone Encounter (Signed)
Call received back from [facility] that they have a staff meeting the morning of 5/31 and that the patient will need to arrive at 8:30 am for a 9:30 am procedure.  She will call me back to confirm once she speaks with [facility] regarding anesthesia.

## 2017-12-09 NOTE — Telephone Encounter (Signed)
Per a call back from [place] in scheduling, anesthesia would prefer we do her case on a different day.  The patient is willing to come on Thursday 12/16/17 at 7:30 am- she will arrive at 6:30 am.  Marcie Bal is scheduling aware of the date change.

## 2017-12-09 NOTE — Telephone Encounter (Signed)
Pt states she would like to schedule a cardioversion. Please call.

## 2017-12-15 ENCOUNTER — Ambulatory Visit: Payer: Medicare Other

## 2017-12-15 DIAGNOSIS — M25511 Pain in right shoulder: Secondary | ICD-10-CM | POA: Diagnosis not present

## 2017-12-15 DIAGNOSIS — R2681 Unsteadiness on feet: Secondary | ICD-10-CM | POA: Diagnosis not present

## 2017-12-15 DIAGNOSIS — Z9181 History of falling: Secondary | ICD-10-CM

## 2017-12-15 DIAGNOSIS — M6281 Muscle weakness (generalized): Secondary | ICD-10-CM

## 2017-12-15 NOTE — Therapy (Signed)
[facility] [address], [place], [address] Phone: [phone]   Fax:  [phone]  Physical Therapy Treatment  Patient Details  Name: [name] MRN: [id number] Date of Birth: [birth year] Referring Provider: Caryl Bis MD   Encounter Date: 12/15/2017  PT End of Session - 12/15/17 1024    Visit Number  30    Number of Visits  32    Date for PT Re-Evaluation  12/15/17    Authorization Type  Progress note 8/10    PT Start Time  1000    PT Stop Time  1045    PT Time Calculation (min)  45 min    Equipment Utilized During Treatment  Gait belt    Activity Tolerance  Patient tolerated treatment well    Behavior During Therapy  [facility] for tasks assessed/performed       Past Medical History:  Diagnosis Date  . (HFpEF) heart failure with preserved ejection fraction ([place])    a. 08/2017 Echo: EF 55-60%, no rwma, mild MR, mildly dil LA, nl RV fxn.  . Breast cancer ([place]) 2001   left breast  . Cancer ([place]) 2001   left breast ca  . Carotid arterial disease ([place])    a. 10/2004 s/p L CEA; b. 12/2015 Carotid U/S: RICA 1-39%; b. LICA patent CEA site.  Marland Kitchen GERD (gastroesophageal reflux disease)   . Hyperlipidemia   . Hypertension   . Osteoarthritis, multiple sites   . Osteopenia   . Persistent atrial fibrillation ([place])    a. Dx 08/2017; b. CHA2DS2VASc = 6-->Xarelto; c. 09/2017 Successful DCCV (second shock - 200J);   Marland Kitchen Personal history of radiation therapy 2001   left breast ca  . Psoriasis     Past Surgical History:  Procedure Laterality Date  . ABDOMINAL HYSTERECTOMY    . ANKLE FRACTURE SURGERY  4/08   left---hardware still in place  . 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 BREAST EXCISIONAL BIOPSY Left yrs ago   benign  . BREAST LUMPECTOMY Left 2001   f/u radiation  . CARDIOVERSION N/A 10/04/2017   Procedure: CARDIOVERSION;  Surgeon: Wellington Hampshire, MD;  Location: [facility] ORS;  Service: Cardiovascular;   Laterality: N/A;  . CAROTID ENDARTERECTOMY Left 10/23/2004  . FRACTURE SURGERY    . OOPHORECTOMY    . SHOULDER SURGERY  6/07   left  . TONSILLECTOMY AND ADENOIDECTOMY    . TOTAL HIP ARTHROPLASTY  2004   right    There were no vitals filed for this visit.  Subjective Assessment - 12/15/17 1014    Subjective  Patient reports she has had a lot less energy than normal. Patient states she's had to take breaks after activities she would not normally have to. Patient reports she is getting a cardioversion tomorrow.    Pertinent History  Patient previously seen for balance difficulties and shoulder pain and the right.  Patient reports no falls in the past six months. Patient reports pain has not improved for the past month and has been about the same since the onset of pain.     Limitations  Walking    How long can you sit comfortably?       Patient Stated Goals  To improve balance when walking     Currently in Pain?  No/denies       TREATMENT:   Therapeutic Exercise: Nustep step level 5 - x59min with use of arms and legs to improve  strengthening  Side stepping across airex beam - x 10 R and L Feet together balance on half foam roller forward and backward - x 2 min Side step ups onto 6 " step - 2 x 5 B  Turning with performing squats on airex pad - x 20  Single leg stance - x 30 sec  Feet together circular weight shifts on large dynadisc - x 20  TRX sit to stands with UE support - 2 x 10  Side stepping along balance stones with UE support - x 10 (four stones)   Patient demonstrates increased fatigue at the end of the session     PT Education - 12/15/17 1022    Education provided  Yes    Education Details  form/technique with exercises    Person(s) Educated  Patient    Methods  Explanation;Demonstration    Comprehension  Verbalized understanding;Returned demonstration          PT Long Term Goals - 11/03/17 1046      PT LONG TERM GOAL #1   Title  Patient will be independent  with HEP to continue benefits of therapy after discharge.     Baseline  Dependent with form and technique for balancing exercises; moderate cueing for balance and strengthening    Time  4    Period  Weeks    Status  On-going      PT LONG TERM GOAL #2   Title  Patient will improve FGA to over 26/30 to indicate functional improvement in strength and balance limitations and decrease fall risk    Baseline  22/30; ; 09/01/2017: 24/30; 09/30/2017: 25/30; 11/03/2017: 25/30    Time  8    Period  Weeks    Status  On-going      PT LONG TERM GOAL #3   Title  Patient will improve 5xSTS to under 16 seconds without use of a pillow to indicate functional improvement of LE function and decreased fall risk    Baseline  19 sec with pillow under chair; 09/01/2017 12 seconds with use of a pillow; 09/30/2017: 10.9 sec with use of pillow; defered today secondary to increased soreness from walking the previous day    Time  8    Period  Weeks    Status  On-going      PT LONG TERM GOAL #4   Title  Patient will be able to balance for 10sec with SLS to improve static balance and ability to get dressed when in standing    Baseline  1 sec; 09/01/2017: 3 sec each leg; 09/30/17: 4.5; 11/03/2017: 5 sec    Time  8    Period  Weeks    Status  On-going            Plan - 12/15/17 1028    Clinical Impression Statement  Patient requires greater sitting rest breaks today with therapy most likely from fatigue secondary to a-fib. Patient does not have symptoms throughout the entirity of therapy session today and has irregular heart rates between 96- 108 through out session which is baseline. Patient instructed to take break with onset of feelings of fatigue. Patient will benefit from further skilled therapy to return to prior level of function.      Rehab Potential  Good    Clinical Impairments Affecting Rehab Potential  Positive: motivation; Negative: , age    PT Frequency  2x / week    PT Duration  6 weeks    PT  Treatment/Interventions  ADLs/Self Care Home Management;Aquatic Therapy;Biofeedback;DME Instruction;Gait training;Stair training;Functional mobility training;Therapeutic activities;Therapeutic exercise;Balance training;Neuromuscular re-education;Patient/family education;Passive range of motion;Energy conservation;Ultrasound;Moist Heat;Iontophoresis 4mg /ml Dexamethasone;Cryotherapy;Electrical Stimulation;Manual techniques;Dry needling    PT Next Visit Plan  Progress balance and strengthening exercises,     PT Home Exercise Plan  See education section    Consulted and Agree with Plan of Care  Patient       Patient will benefit from skilled therapeutic intervention in order to improve the following deficits and impairments:  Pain, Increased fascial restricitons, Decreased coordination, Decreased mobility, Decreased activity tolerance, Decreased endurance, Decreased range of motion, Decreased strength, Hypomobility, Postural dysfunction, Abnormal gait, Decreased balance, Difficulty walking  Visit Diagnosis: Muscle weakness (generalized)  Unsteadiness on feet  History of falling     Problem List Patient Active Problem List   Diagnosis Date Noted  . Hypomagnesemia 09/29/2017  . Hypokalemia 09/29/2017  . Atrial fibrillation ([place]) 09/10/2017  . Trapezius strain, right, initial encounter 09/10/2017  . Depression, major, single episode, mild ([place]) 09/10/2017  . Chronic venous insufficiency 09/09/2017  . Lymphedema 09/09/2017  . Pain of toe of left foot 08/17/2017  . Grief 08/17/2017  . Epistaxis 07/31/2017  . Leg swelling 07/31/2017  . Dry eyes 07/06/2017  . Pain and swelling of lower extremity, right 03/29/2017  . Right shoulder pain 03/29/2017  . Diarrhea 05/18/2016  . Fall 05/01/2016  . Unintentional weight loss 12/30/2015  . Leg weakness, bilateral 12/30/2015  . Hyperkalemia 01/01/2015  . Occasional numbness/prickling/tingling of fingers and toes 12/18/2014  . Routine general  medical examination at a health care facility 12/13/2013  . Macrocytic anemia 05/19/2013  . Psoriatic arthritis ([place]) 02/03/2013  . Psoriasis   . Osteoarthritis, multiple sites   . Episodic mood disorder ([place]) 12/01/2011  . History of breast cancer 10/28/2011  . GERD (gastroesophageal reflux disease)   . Hyperlipidemia   . Hypertension   . Osteopenia   . Carotid stenosis 05/27/2011    Blythe Stanford, PT DPT 12/15/2017, 10:38 AM  [facility] [address], [place], [address] Phone: [phone]   Fax:  [phone]  Name: [name] MRN: [id number] Date of Birth: 1941-02-10

## 2017-12-16 ENCOUNTER — Encounter
Admission: RE | Disposition: A | Discharge status: Home / Self Care | Payer: Self-pay | Source: Ambulatory Visit | Attending: Internal Medicine

## 2017-12-16 ENCOUNTER — Ambulatory Visit: Payer: Medicare Other | Admitting: Anesthesiology

## 2017-12-16 ENCOUNTER — Ambulatory Visit
Admission: RE | Admit: 2017-12-16 | Discharge: 2017-12-16 | Disposition: A | Discharge status: Home / Self Care | Payer: Medicare Other | Source: Ambulatory Visit | Attending: Internal Medicine | Admitting: Internal Medicine

## 2017-12-16 ENCOUNTER — Encounter: Payer: Self-pay | Admitting: *Deleted

## 2017-12-16 DIAGNOSIS — I4891 Unspecified atrial fibrillation: Secondary | ICD-10-CM | POA: Diagnosis not present

## 2017-12-16 DIAGNOSIS — D649 Anemia, unspecified: Secondary | ICD-10-CM | POA: Diagnosis not present

## 2017-12-16 DIAGNOSIS — K219 Gastro-esophageal reflux disease without esophagitis: Secondary | ICD-10-CM | POA: Diagnosis not present

## 2017-12-16 DIAGNOSIS — Z9889 Other specified postprocedural states: Secondary | ICD-10-CM | POA: Insufficient documentation

## 2017-12-16 DIAGNOSIS — Z923 Personal history of irradiation: Secondary | ICD-10-CM | POA: Insufficient documentation

## 2017-12-16 DIAGNOSIS — I481 Persistent atrial fibrillation: Secondary | ICD-10-CM | POA: Insufficient documentation

## 2017-12-16 DIAGNOSIS — I739 Peripheral vascular disease, unspecified: Secondary | ICD-10-CM | POA: Diagnosis not present

## 2017-12-16 DIAGNOSIS — E785 Hyperlipidemia, unspecified: Secondary | ICD-10-CM | POA: Insufficient documentation

## 2017-12-16 DIAGNOSIS — L409 Psoriasis, unspecified: Secondary | ICD-10-CM | POA: Diagnosis not present

## 2017-12-16 DIAGNOSIS — Z7901 Long term (current) use of anticoagulants: Secondary | ICD-10-CM | POA: Insufficient documentation

## 2017-12-16 DIAGNOSIS — Z90710 Acquired absence of both cervix and uterus: Secondary | ICD-10-CM | POA: Diagnosis not present

## 2017-12-16 DIAGNOSIS — Z96641 Presence of right artificial hip joint: Secondary | ICD-10-CM | POA: Insufficient documentation

## 2017-12-16 DIAGNOSIS — I110 Hypertensive heart disease with heart failure: Secondary | ICD-10-CM | POA: Diagnosis not present

## 2017-12-16 DIAGNOSIS — I10 Essential (primary) hypertension: Secondary | ICD-10-CM | POA: Diagnosis not present

## 2017-12-16 DIAGNOSIS — M8580 Other specified disorders of bone density and structure, unspecified site: Secondary | ICD-10-CM | POA: Insufficient documentation

## 2017-12-16 DIAGNOSIS — Z79899 Other long term (current) drug therapy: Secondary | ICD-10-CM | POA: Insufficient documentation

## 2017-12-16 DIAGNOSIS — R600 Localized edema: Secondary | ICD-10-CM | POA: Diagnosis not present

## 2017-12-16 DIAGNOSIS — M1990 Unspecified osteoarthritis, unspecified site: Secondary | ICD-10-CM | POA: Diagnosis not present

## 2017-12-16 DIAGNOSIS — Z853 Personal history of malignant neoplasm of breast: Secondary | ICD-10-CM | POA: Insufficient documentation

## 2017-12-16 DIAGNOSIS — I5033 Acute on chronic diastolic (congestive) heart failure: Secondary | ICD-10-CM | POA: Diagnosis not present

## 2017-12-16 DIAGNOSIS — I6523 Occlusion and stenosis of bilateral carotid arteries: Secondary | ICD-10-CM | POA: Diagnosis not present

## 2017-12-16 HISTORY — PX: CARDIOVERSION: EP1203

## 2017-12-16 SURGERY — CARDIOVERSION (CATH LAB)
Anesthesia: General

## 2017-12-16 MED ORDER — PROPOFOL 10 MG/ML IV BOLUS
INTRAVENOUS | Status: DC | PRN
  Administered 2017-12-16: 50 mg via INTRAVENOUS

## 2017-12-16 MED ORDER — SODIUM CHLORIDE 0.9 % IV SOLN: INTRAVENOUS | Status: DC

## 2017-12-16 MED ORDER — PROPOFOL 10 MG/ML IV BOLUS
INTRAVENOUS | Status: AC
  Filled 2017-12-16: qty 20

## 2017-12-16 MED ORDER — SODIUM CHLORIDE 0.9 % IV SOLN
INTRAVENOUS | Status: DC | PRN
  Administered 2017-12-16 (×2): via INTRAVENOUS

## 2017-12-16 NOTE — Procedures (Signed)
.  Preop Dx atrial fib Post op DX  NSR  Procedure  DC Cardioversion   Pt was sedated by anesthesia receiving 50 mg Propafol  A synchronized shock 120 joules restored sinus Rhythm   Pt tolerated without difficulty

## 2017-12-16 NOTE — Anesthesia Post-op Follow-up Note (Signed)
Anesthesia QCDR form completed.        

## 2017-12-16 NOTE — Anesthesia Preprocedure Evaluation (Signed)
Anesthesia Evaluation  Patient identified by MRN, date of birth, ID band Patient awake    Reviewed: Allergy & Precautions, H&P , NPO status , Patient's Chart, lab work & pertinent test results, reviewed documented beta blocker date and time   Airway Mallampati: II   Neck ROM: full    Dental  (+) Poor Dentition   Pulmonary neg pulmonary ROS, former smoker,    Pulmonary exam normal        Cardiovascular Exercise Tolerance: Poor hypertension, On Medications + Peripheral Vascular Disease  negative cardio ROS Normal cardiovascular examAtrial Fibrillation  Rhythm:regular Rate:Normal     Neuro/Psych PSYCHIATRIC DISORDERS Depression  Neuromuscular disease negative neurological ROS  negative psych ROS   GI/Hepatic negative GI ROS, Neg liver ROS, GERD  Medicated,  Endo/Other  negative endocrine ROS  Renal/GU negative Renal ROS  negative genitourinary   Musculoskeletal   Abdominal   Peds  Hematology negative hematology ROS (+) anemia ,   Anesthesia Other Findings Past Medical History: No date: (HFpEF) heart failure with preserved ejection fraction ([place])     Comment:  a. 08/2017 Echo: EF 55-60%, no rwma, mild MR, mildly dil               LA, nl RV fxn. 2001: Breast cancer ([place])     Comment:  left breast 2001: Cancer ([place])     Comment:  left breast ca No date: Carotid arterial disease ([place])     Comment:  a. 10/2004 s/p L CEA; b. 12/2015 Carotid U/S: RICA 1-39%;               b. LICA patent CEA site. No date: GERD (gastroesophageal reflux disease) No date: Hyperlipidemia No date: Hypertension No date: Osteoarthritis, multiple sites No date: Osteopenia No date: Persistent atrial fibrillation ([place])     Comment:  a. Dx 08/2017; b. CHA2DS2VASc = 6-->Xarelto; c. 09/2017               Successful DCCV (second shock - 200J);  2001: Personal history of radiation therapy     Comment:  left breast ca No date: Psoriasis Past  Surgical History: No date: ABDOMINAL HYSTERECTOMY 4/08: ANKLE FRACTURE SURGERY     Comment:  left---hardware still in place 2001: BREAST BIOPSY; Left     Comment:  breast ca yrs ago: BREAST EXCISIONAL BIOPSY; Left     Comment:  benign 2001: BREAST LUMPECTOMY; Left     Comment:  f/u radiation 10/04/2017: CARDIOVERSION; N/A     Comment:  Procedure: CARDIOVERSION;  Surgeon: Wellington Hampshire,               MD;  Location: [facility] ORS;  Service: Cardiovascular;                Laterality: N/A; 10/23/2004: CAROTID ENDARTERECTOMY; Left No date: FRACTURE SURGERY No date: OOPHORECTOMY 6/07: SHOULDER SURGERY     Comment:  left No date: TONSILLECTOMY AND ADENOIDECTOMY 2004: TOTAL HIP ARTHROPLASTY     Comment:  right BMI    Body Mass Index:  18.76 kg/m     Reproductive/Obstetrics negative OB ROS                             Anesthesia Physical Anesthesia Plan  ASA: IV  Anesthesia Plan: General   Post-op Pain Management:    Induction:   PONV Risk Score and Plan:   Airway Management Planned:   Additional Equipment:   Intra-op Plan:  Post-operative Plan:   Informed Consent: I have reviewed the patients History and Physical, chart, labs and discussed the procedure including the risks, benefits and alternatives for the proposed anesthesia with the patient or authorized representative who has indicated his/her understanding and acceptance.   Dental Advisory Given  Plan Discussed with: CRNA  Anesthesia Plan Comments:         Anesthesia Quick Evaluation

## 2017-12-16 NOTE — Interval H&P Note (Signed)
History and Physical Interval Note:  12/16/2017 7:45 AM  [name]  has presented today for surgery, with the diagnosis of Cardioversion  Afib  The various methods of treatment have been discussed with the patient and family. After consideration of risks, benefits and other options for treatment, the patient has consented to  Procedure(s): CARDIOVERSION (N/A) as a surgical intervention .  The patient's history has been reviewed, patient examined, no change in status, stable for surgery.  I have reviewed the patient's chart and labs.  Questions were answered to the patient's satisfaction.     Virl Axe  Taking Rivaroxaban as prescribed

## 2017-12-16 NOTE — Anesthesia Procedure Notes (Signed)
Date/Time: 12/16/2017 7:44 AM Performed by: Doreen Salvage, CRNA Pre-anesthesia Checklist: Patient identified, Emergency Drugs available, Suction available and Patient being monitored Patient Re-evaluated:Patient Re-evaluated prior to induction Oxygen Delivery Method: Nasal cannula Induction Type: IV induction Dental Injury: Teeth and Oropharynx as per pre-operative assessment  Comments: Nasal cannula with etCO2 monitoring

## 2017-12-16 NOTE — Transfer of Care (Signed)
Immediate Anesthesia Transfer of Care Note  Patient: [name]  Procedure(s) Performed: Procedure(s): CARDIOVERSION (N/A)  Patient Location: PACU and Short Stay  Anesthesia Type:General  Level of Consciousness: awake, alert  and oriented  Airway & Oxygen Therapy: Patient Spontanous Breathing and Patient connected to nasal cannula oxygen  Post-op Assessment: Report given to RN and Post -op Vital signs reviewed and stable  Post vital signs: Reviewed and stable  Last Vitals:  Vitals:   12/16/17 0749 12/16/17 0750  BP:  (!) 88/58  Pulse: (!) 50   Resp: (!) 23 12  Temp:    SpO2: 05% 39%    Complications: No apparent anesthesia complications

## 2017-12-17 ENCOUNTER — Telehealth: Payer: Self-pay | Admitting: Internal Medicine

## 2017-12-17 NOTE — Telephone Encounter (Signed)
Lmov for patient to call ° °

## 2017-12-17 NOTE — Telephone Encounter (Signed)
-----   Message from Emily Filbert, RN sent at 12/16/2017  8:25 AM EDT ----- Patient had a cardioversion this morning and needs to see Dr. Caryl Comes in about 4 weeks. Do you mind calling and offering her an appointment with Dr. Caryl Comes on:  Tuesday 6/25 @ 9:15 am or Tuesday 7/2 @ 9:15 am  Thanks!

## 2017-12-20 ENCOUNTER — Ambulatory Visit: Payer: Medicare Other | Attending: Family Medicine

## 2017-12-20 DIAGNOSIS — R2681 Unsteadiness on feet: Secondary | ICD-10-CM | POA: Diagnosis not present

## 2017-12-20 DIAGNOSIS — Z9181 History of falling: Secondary | ICD-10-CM

## 2017-12-20 DIAGNOSIS — M545 Low back pain: Secondary | ICD-10-CM | POA: Insufficient documentation

## 2017-12-20 DIAGNOSIS — M6281 Muscle weakness (generalized): Secondary | ICD-10-CM | POA: Insufficient documentation

## 2017-12-20 DIAGNOSIS — M25511 Pain in right shoulder: Secondary | ICD-10-CM | POA: Diagnosis not present

## 2017-12-20 NOTE — Therapy (Signed)
[facility] [address], [place], [address] Phone: [phone]   Fax:  [phone]  Physical Therapy Treatment  Patient Details  Name: [name] MRN: [id number] Date of Birth: [birth year] Referring Provider: Caryl Bis MD   Encounter Date: 12/20/2017  PT End of Session - 12/20/17 1339    Visit Number  31    Number of Visits  40    Date for PT Re-Evaluation  01/17/18    Authorization Type  Progress note today    PT Start Time  1300    PT Stop Time  1345    PT Time Calculation (min)  45 min    Equipment Utilized During Treatment  Gait belt    Activity Tolerance  Patient tolerated treatment well    Behavior During Therapy  [facility] for tasks assessed/performed       Past Medical History:  Diagnosis Date  . (HFpEF) heart failure with preserved ejection fraction ([place])    a. 08/2017 Echo: EF 55-60%, no rwma, mild MR, mildly dil LA, nl RV fxn.  . Breast cancer ([place]) 2001   left breast  . Cancer ([place]) 2001   left breast ca  . Carotid arterial disease ([place])    a. 10/2004 s/p L CEA; b. 12/2015 Carotid U/S: RICA 1-39%; b. LICA patent CEA site.  Marland Kitchen GERD (gastroesophageal reflux disease)   . Hyperlipidemia   . Hypertension   . Osteoarthritis, multiple sites   . Osteopenia   . Persistent atrial fibrillation ([place])    a. Dx 08/2017; b. CHA2DS2VASc = 6-->Xarelto; c. 09/2017 Successful DCCV (second shock - 200J);   Marland Kitchen Personal history of radiation therapy 2001   left breast ca  . Psoriasis     Past Surgical History:  Procedure Laterality Date  . ABDOMINAL HYSTERECTOMY    . ANKLE FRACTURE SURGERY  4/08   left---hardware still in place  . 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 BREAST EXCISIONAL BIOPSY Left yrs ago   benign  . BREAST LUMPECTOMY Left 2001   f/u radiation  . CARDIOVERSION N/A 10/04/2017   Procedure: CARDIOVERSION;  Surgeon: Wellington Hampshire, MD;  Location: [facility] ORS;  Service: Cardiovascular;   Laterality: N/A;  . CARDIOVERSION N/A 12/16/2017   Procedure: CARDIOVERSION;  Surgeon: [name], MD;  Location: [facility] ORS;  Service: Cardiovascular;  Laterality: N/A;  . CAROTID ENDARTERECTOMY Left 10/23/2004  . FRACTURE SURGERY    . OOPHORECTOMY    . SHOULDER SURGERY  6/07   left  . TONSILLECTOMY AND ADENOIDECTOMY    . TOTAL HIP ARTHROPLASTY  2004   right    There were no vitals filed for this visit.  Subjective Assessment - 12/20/17 1314    Subjective  Patient reports she has been feeling better since having the cardioversion on the other day. Patient states overall improvement with ambulation and energy.     Pertinent History  Patient previously seen for balance difficulties and shoulder pain and the right.  Patient reports no falls in the past six months. Patient reports pain has not improved for the past month and has been about the same since the onset of pain.     Limitations  Walking    How long can you sit comfortably?       Patient Stated Goals  To improve balance when walking     Currently in Pain?  No/denies        TREATMENT:  Therapeutic Exercise: Nustep step level 5 - x74min with use of arms and legs to improve strengthening  TRX sit to stands with UE support - 2 x 10  Stepping on balance stones with UE support - x 20 (two stones) Single leg stance - 2 x 30 sec Sit to stands with pillows on a chair - x6 with focus on speed  Backwards ambulation - x 30  Side stepping against black tubing - x5   Patient demonstrates increased fatigue at the end of the session   PT Education - 12/20/17 1339    Education provided  Yes    Education Details  form/technique with exercises    Person(s) Educated  Patient    Methods  Explanation;Demonstration    Comprehension  Verbalized understanding;Returned demonstration          PT Long Term Goals - 11/03/17 1046      PT LONG TERM GOAL #1   Title  Patient will be independent with HEP to continue benefits of therapy  after discharge.     Baseline  Dependent with form and technique for balancing exercises; moderate cueing for balance and strengthening    Time  4    Period  Weeks    Status  On-going      PT LONG TERM GOAL #2   Title  Patient will improve FGA to over 26/30 to indicate functional improvement in strength and balance limitations and decrease fall risk    Baseline  22/30; ; 09/01/2017: 24/30; 09/30/2017: 25/30; 11/03/2017: 25/30    Time  8    Period  Weeks    Status  On-going      PT LONG TERM GOAL #3   Title  Patient will improve 5xSTS to under 16 seconds without use of a pillow to indicate functional improvement of LE function and decreased fall risk    Baseline  19 sec with pillow under chair; 09/01/2017 12 seconds with use of a pillow; 09/30/2017: 10.9 sec with use of pillow; defered today secondary to increased soreness from walking the previous day    Time  8    Period  Weeks    Status  On-going      PT LONG TERM GOAL #4   Title  Patient will be able to balance for 10sec with SLS to improve static balance and ability to get dressed when in standing    Baseline  1 sec; 09/01/2017: 3 sec each leg; 09/30/17: 4.5; 11/03/2017: 5 sec    Time  8    Period  Weeks    Status  On-going            Plan - 12/20/17 1356    Clinical Impression Statement  Patient is making progress towards long term goals with improvement 5xSTS, FGA, and SLS indicating improvement in strength and balance. Patient demosntrates improvement in fatigue today and has a stabilized heart rate throuhgout the enitriity of therapy. Patient demonstrates decreased straength and dynamic balance. Patient will benefit from further skilled therapy to return to prior level of function.     Rehab Potential  Good    Clinical Impairments Affecting Rehab Potential  Positive: motivation; Negative: , age    PT Frequency  2x / week    PT Duration  6 weeks    PT Treatment/Interventions  ADLs/Self Care Home Management;Aquatic  Therapy;Biofeedback;DME Instruction;Gait training;Stair training;Functional mobility training;Therapeutic activities;Therapeutic exercise;Balance training;Neuromuscular re-education;Patient/family education;Passive range of motion;Energy conservation;Ultrasound;Moist Heat;Iontophoresis 4mg /ml Dexamethasone;Cryotherapy;Electrical Stimulation;Manual techniques;Dry needling    PT  Next Visit Plan  Progress balance and strengthening exercises,     PT Home Exercise Plan  See education section    Consulted and Agree with Plan of Care  Patient       Patient will benefit from skilled therapeutic intervention in order to improve the following deficits and impairments:  Pain, Increased fascial restricitons, Decreased coordination, Decreased mobility, Decreased activity tolerance, Decreased endurance, Decreased range of motion, Decreased strength, Hypomobility, Postural dysfunction, Abnormal gait, Decreased balance, Difficulty walking  Visit Diagnosis: Muscle weakness (generalized)  Unsteadiness on feet  History of falling     Problem List Patient Active Problem List   Diagnosis Date Noted  . Hypomagnesemia 09/29/2017  . Hypokalemia 09/29/2017  . Atrial fibrillation ([place]) 09/10/2017  . Trapezius strain, right, initial encounter 09/10/2017  . Depression, major, single episode, mild ([place]) 09/10/2017  . Chronic venous insufficiency 09/09/2017  . Lymphedema 09/09/2017  . Pain of toe of left foot 08/17/2017  . Grief 08/17/2017  . Epistaxis 07/31/2017  . Leg swelling 07/31/2017  . Dry eyes 07/06/2017  . Pain and swelling of lower extremity, right 03/29/2017  . Right shoulder pain 03/29/2017  . Diarrhea 05/18/2016  . Fall 05/01/2016  . Unintentional weight loss 12/30/2015  . Leg weakness, bilateral 12/30/2015  . Hyperkalemia 01/01/2015  . Occasional numbness/prickling/tingling of fingers and toes 12/18/2014  . Routine general medical examination at a health care facility 12/13/2013  .  Macrocytic anemia 05/19/2013  . Psoriatic arthritis ([place]) 02/03/2013  . Psoriasis   . Osteoarthritis, multiple sites   . Episodic mood disorder ([place]) 12/01/2011  . History of breast cancer 10/28/2011  . GERD (gastroesophageal reflux disease)   . Hyperlipidemia   . Hypertension   . Osteopenia   . Carotid stenosis 05/27/2011    Blythe Stanford, PT DPT 12/20/2017, 2:06 PM  [facility] [address] [place], [address] Phone: [phone]   Fax:  [phone]  Name: [name] MRN: [id number] Date of Birth: 1940/07/27

## 2017-12-20 NOTE — Anesthesia Postprocedure Evaluation (Signed)
Anesthesia Post Note  Patient: [name]  Procedure(s) Performed: CARDIOVERSION (N/A )  Patient location during evaluation: PACU Anesthesia Type: General Level of consciousness: awake and alert Pain management: pain level controlled Vital Signs Assessment: post-procedure vital signs reviewed and stable Respiratory status: spontaneous breathing, nonlabored ventilation, respiratory function stable and patient connected to nasal cannula oxygen Cardiovascular status: blood pressure returned to baseline and stable Postop Assessment: no apparent nausea or vomiting Anesthetic complications: no     Last Vitals:  Vitals:   12/16/17 0800 12/16/17 0815  BP: 114/66 121/78  Pulse: (!) 54 (!) 59  Resp: (!) 21 17  Temp:    SpO2: 100% 98%    Last Pain:  Vitals:   12/16/17 0701  PainSc: 0-No pain                 Molli Barrows

## 2017-12-22 ENCOUNTER — Ambulatory Visit: Payer: Medicare Other

## 2017-12-22 DIAGNOSIS — M6281 Muscle weakness (generalized): Secondary | ICD-10-CM

## 2017-12-22 DIAGNOSIS — M25511 Pain in right shoulder: Secondary | ICD-10-CM | POA: Diagnosis not present

## 2017-12-22 DIAGNOSIS — Z9181 History of falling: Secondary | ICD-10-CM

## 2017-12-22 DIAGNOSIS — R2681 Unsteadiness on feet: Secondary | ICD-10-CM | POA: Diagnosis not present

## 2017-12-22 DIAGNOSIS — M545 Low back pain: Secondary | ICD-10-CM | POA: Diagnosis not present

## 2017-12-22 NOTE — Therapy (Signed)
[facility] [address] [place], [address] Phone: [phone]   Fax:  [phone]  Physical Therapy Treatment  Patient Details  Name: [name] MRN: [id number] Date of Birth: [birth year] Referring Provider: Caryl Bis MD   Encounter Date: 12/22/2017  PT End of Session - 12/22/17 1034    Visit Number  32    Number of Visits  40    Date for PT Re-Evaluation  01/17/18    Authorization Type  1/ 10 Progress note    PT Start Time  1000    PT Stop Time  1045    PT Time Calculation (min)  45 min    Equipment Utilized During Treatment  Gait belt    Activity Tolerance  Patient tolerated treatment well    Behavior During Therapy  [facility] for tasks assessed/performed       Past Medical History:  Diagnosis Date  . (HFpEF) heart failure with preserved ejection fraction ([place])    a. 08/2017 Echo: EF 55-60%, no rwma, mild MR, mildly dil LA, nl RV fxn.  . Breast cancer ([place]) 2001   left breast  . Cancer ([place]) 2001   left breast ca  . Carotid arterial disease ([place])    a. 10/2004 s/p L CEA; b. 12/2015 Carotid U/S: RICA 1-39%; b. LICA patent CEA site.  Marland Kitchen GERD (gastroesophageal reflux disease)   . Hyperlipidemia   . Hypertension   . Osteoarthritis, multiple sites   . Osteopenia   . Persistent atrial fibrillation ([place])    a. Dx 08/2017; b. CHA2DS2VASc = 6-->Xarelto; c. 09/2017 Successful DCCV (second shock - 200J);   Marland Kitchen Personal history of radiation therapy 2001   left breast ca  . Psoriasis     Past Surgical History:  Procedure Laterality Date  . ABDOMINAL HYSTERECTOMY    . ANKLE FRACTURE SURGERY  4/08   left---hardware still in place  . 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 BREAST EXCISIONAL BIOPSY Left yrs ago   benign  . BREAST LUMPECTOMY Left 2001   f/u radiation  . CARDIOVERSION N/A 10/04/2017   Procedure: CARDIOVERSION;  Surgeon: Wellington Hampshire, MD;  Location: [facility] ORS;  Service: Cardiovascular;   Laterality: N/A;  . CARDIOVERSION N/A 12/16/2017   Procedure: CARDIOVERSION;  Surgeon: [name], MD;  Location: [facility] ORS;  Service: Cardiovascular;  Laterality: N/A;  . CAROTID ENDARTERECTOMY Left 10/23/2004  . FRACTURE SURGERY    . OOPHORECTOMY    . SHOULDER SURGERY  6/07   left  . TONSILLECTOMY AND ADENOIDECTOMY    . TOTAL HIP ARTHROPLASTY  2004   right    There were no vitals filed for this visit.  Subjective Assessment - 12/22/17 1019    Subjective  Patient reports no major changes since the previous treatment session. Patient reports she feels like she is less fatigued after the cardioversion.     Pertinent History  Patient previously seen for balance difficulties and shoulder pain and the right.  Patient reports no falls in the past six months. Patient reports pain has not improved for the past month and has been about the same since the onset of pain.     Limitations  Walking    How long can you sit comfortably?       Patient Stated Goals  To improve balance when walking     Currently in Pain?  No/denies       TREATMENT:  Therapeutic Exercise:  Nustep step level 5 - x8min with use of arms and legs to improve strengthening  TRX sit to stands with UE support -2 x 10 Stepping on balance stones with UE support - x 20 (two stones) Single leg stance - 2 x 30 sec Sit to stands with pillows on a chair - x6 with focus on speed  Backwards ambulation - x 30  Side stepping against black tubing - x5  Patient demonstrates increased fatigue at the end of the session    PT Education - 12/22/17 1033    Education provided  Yes    Education Details  form/technique with exercises    Person(s) Educated  Patient    Methods  Explanation;Demonstration    Comprehension  Verbalized understanding;Returned demonstration          PT Long Term Goals - 11/03/17 1046      PT LONG TERM GOAL #1   Title  Patient will be independent with HEP to continue benefits of therapy after  discharge.     Baseline  Dependent with form and technique for balancing exercises; moderate cueing for balance and strengthening    Time  4    Period  Weeks    Status  On-going      PT LONG TERM GOAL #2   Title  Patient will improve FGA to over 26/30 to indicate functional improvement in strength and balance limitations and decrease fall risk    Baseline  22/30; ; 09/01/2017: 24/30; 09/30/2017: 25/30; 11/03/2017: 25/30    Time  8    Period  Weeks    Status  On-going      PT LONG TERM GOAL #3   Title  Patient will improve 5xSTS to under 16 seconds without use of a pillow to indicate functional improvement of LE function and decreased fall risk    Baseline  19 sec with pillow under chair; 09/01/2017 12 seconds with use of a pillow; 09/30/2017: 10.9 sec with use of pillow; defered today secondary to increased soreness from walking the previous day    Time  8    Period  Weeks    Status  On-going      PT LONG TERM GOAL #4   Title  Patient will be able to balance for 10sec with SLS to improve static balance and ability to get dressed when in standing    Baseline  1 sec; 09/01/2017: 3 sec each leg; 09/30/17: 4.5; 11/03/2017: 5 sec    Time  8    Period  Weeks    Status  On-going            Plan - 12/22/17 1041    Clinical Impression Statement  Patient demonstrates improvement with fatigue levels today versus previous sessions indicating funcitonal improvements between visitation sessions. Patient is able to perform greater amount of reps with execises indicating muscular endurance improvement, however patient continues to demonstrate decreased muscular coordination and balance with exercise requiring UE support to perform indicating decreased dynamic balance. Patient will benefit from further skilled therapy to return to prior level of function.     Rehab Potential  Good    Clinical Impairments Affecting Rehab Potential  Positive: motivation; Negative: , age    PT Frequency  2x / week    PT  Duration  6 weeks    PT Treatment/Interventions  ADLs/Self Care Home Management;Aquatic Therapy;Biofeedback;DME Instruction;Gait training;Stair training;Functional mobility training;Therapeutic activities;Therapeutic exercise;Balance training;Neuromuscular re-education;Patient/family education;Passive range of motion;Energy conservation;Ultrasound;Moist Heat;Iontophoresis 4mg /ml Dexamethasone;Cryotherapy;Electrical Stimulation;Manual techniques;Dry needling  PT Next Visit Plan  Progress balance and strengthening exercises,     PT Home Exercise Plan  See education section    Consulted and Agree with Plan of Care  Patient       Patient will benefit from skilled therapeutic intervention in order to improve the following deficits and impairments:  Pain, Increased fascial restricitons, Decreased coordination, Decreased mobility, Decreased activity tolerance, Decreased endurance, Decreased range of motion, Decreased strength, Hypomobility, Postural dysfunction, Abnormal gait, Decreased balance, Difficulty walking  Visit Diagnosis: Muscle weakness (generalized)  Unsteadiness on feet  History of falling     Problem List Patient Active Problem List   Diagnosis Date Noted  . Hypomagnesemia 09/29/2017  . Hypokalemia 09/29/2017  . Atrial fibrillation ([place]) 09/10/2017  . Trapezius strain, right, initial encounter 09/10/2017  . Depression, major, single episode, mild ([place]) 09/10/2017  . Chronic venous insufficiency 09/09/2017  . Lymphedema 09/09/2017  . Pain of toe of left foot 08/17/2017  . Grief 08/17/2017  . Epistaxis 07/31/2017  . Leg swelling 07/31/2017  . Dry eyes 07/06/2017  . Pain and swelling of lower extremity, right 03/29/2017  . Right shoulder pain 03/29/2017  . Diarrhea 05/18/2016  . Fall 05/01/2016  . Unintentional weight loss 12/30/2015  . Leg weakness, bilateral 12/30/2015  . Hyperkalemia 01/01/2015  . Occasional numbness/prickling/tingling of fingers and toes 12/18/2014   . Routine general medical examination at a health care facility 12/13/2013  . Macrocytic anemia 05/19/2013  . Psoriatic arthritis ([place]) 02/03/2013  . Psoriasis   . Osteoarthritis, multiple sites   . Episodic mood disorder ([place]) 12/01/2011  . History of breast cancer 10/28/2011  . GERD (gastroesophageal reflux disease)   . Hyperlipidemia   . Hypertension   . Osteopenia   . Carotid stenosis 05/27/2011    Blythe Stanford, PT DPT 12/22/2017, 10:48 AM  [facility] [address] [place], [address] Phone: [phone]   Fax:  [phone]  Name: ZARAI ORSBORN MRN: [id number] Date of Birth: April 01, 1941

## 2017-12-29 ENCOUNTER — Ambulatory Visit: Payer: Medicare Other

## 2017-12-29 DIAGNOSIS — M25511 Pain in right shoulder: Secondary | ICD-10-CM | POA: Diagnosis not present

## 2017-12-29 DIAGNOSIS — M545 Low back pain: Secondary | ICD-10-CM | POA: Diagnosis not present

## 2017-12-29 DIAGNOSIS — M6281 Muscle weakness (generalized): Secondary | ICD-10-CM | POA: Diagnosis not present

## 2017-12-29 DIAGNOSIS — Z9181 History of falling: Secondary | ICD-10-CM | POA: Diagnosis not present

## 2017-12-29 DIAGNOSIS — R2681 Unsteadiness on feet: Secondary | ICD-10-CM | POA: Diagnosis not present

## 2017-12-29 NOTE — Therapy (Cosign Needed Addendum)
[facility] [address] [place], [address] Phone: [phone]   Fax:  [phone]  Physical Therapy Treatment  Patient Details  Name: [name] MRN: [id number] Date of Birth: June 01, 1941 Referring Provider: Caryl Bis MD   Encounter Date: 12/29/2017  PT End of Session - 12/29/17 1029    Visit Number  33    Number of Visits  40    Date for PT Re-Evaluation  01/17/18    Authorization Type  2/10    PT Start Time  0945    PT Stop Time  1028    PT Time Calculation (min)  43 min    Equipment Utilized During Treatment  Gait belt    Activity Tolerance  Patient tolerated treatment well    Behavior During Therapy  [facility] for tasks assessed/performed       Past Medical History:  Diagnosis Date  . (HFpEF) heart failure with preserved ejection fraction ([place])    a. 08/2017 Echo: EF 55-60%, no rwma, mild MR, mildly dil LA, nl RV fxn.  . Breast cancer ([place]) 2001   left breast  . Cancer ([place]) 2001   left breast ca  . Carotid arterial disease ([place])    a. 10/2004 s/p L CEA; b. 12/2015 Carotid U/S: RICA 1-39%; b. LICA patent CEA site.  Marland Kitchen GERD (gastroesophageal reflux disease)   . Hyperlipidemia   . Hypertension   . Osteoarthritis, multiple sites   . Osteopenia   . Persistent atrial fibrillation ([place])    a. Dx 08/2017; b. CHA2DS2VASc = 6-->Xarelto; c. 09/2017 Successful DCCV (second shock - 200J);   Marland Kitchen Personal history of radiation therapy 2001   left breast ca  . Psoriasis     Past Surgical History:  Procedure Laterality Date  . ABDOMINAL HYSTERECTOMY    . ANKLE FRACTURE SURGERY  4/08   left---hardware still in place  . 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 BREAST EXCISIONAL BIOPSY Left yrs ago   benign  . BREAST LUMPECTOMY Left 2001   f/u radiation  . CARDIOVERSION N/A 10/04/2017   Procedure: CARDIOVERSION;  Surgeon: Wellington Hampshire, MD;  Location: [facility] ORS;  Service: Cardiovascular;  Laterality:  N/A;  . CARDIOVERSION N/A 12/16/2017   Procedure: CARDIOVERSION;  Surgeon: [name], MD;  Location: [facility] ORS;  Service: Cardiovascular;  Laterality: N/A;  . CAROTID ENDARTERECTOMY Left 10/23/2004  . FRACTURE SURGERY    . OOPHORECTOMY    . SHOULDER SURGERY  6/07   left  . TONSILLECTOMY AND ADENOIDECTOMY    . TOTAL HIP ARTHROPLASTY  2004   right    There were no vitals filed for this visit.  Subjective Assessment - 12/29/17 1039    Subjective  Patient reports that she is feeling well and no longer feels as fatigued as she was in the past.     Pertinent History  Patient previously seen for balance difficulties and shoulder pain and the right.  Patient reports no falls in the past six months. Patient reports pain has not improved for the past month and has been about the same since the onset of pain.     Limitations  Walking    Patient Stated Goals  To improve balance when walking     Currently in Pain?  No/denies       TREATMENT Therapeutic Exercise Nu Step level 4 -- 5 min to promote incr LE strength and endurance Balance stones (4) lateral stepping  x10 down and back  Grape vine -- 69m x6 improved with practice but had to look at feet to maintain balance Slow high knees to challenge SL stance -- 33mx6, verbal cues to slow down and not look at feet in order to further challenge balance  TRX sit to stands -- 10x2 to improve LE strengthening Airex balance beam tandem amb x10, lateral stepping x10 Hip abduction machine 40# B -- x20 B    Pt tolerated therapeutic exercises well and reported decreased fatigue at end of session    PT Education - 12/29/17 0958    Education provided  Yes    Education Details  Pt educated on form and technique for therapeutic exercises.    Person(s) Educated  Patient    Methods  Explanation;Demonstration    Comprehension  Verbalized understanding          PT Long Term Goals - 11/03/17 1046      PT LONG TERM GOAL #1   Title  Patient will be  independent with HEP to continue benefits of therapy after discharge.     Baseline  Dependent with form and technique for balancing exercises; moderate cueing for balance and strengthening    Time  4    Period  Weeks    Status  On-going      PT LONG TERM GOAL #2   Title  Patient will improve FGA to over 26/30 to indicate functional improvement in strength and balance limitations and decrease fall risk    Baseline  22/30; ; 09/01/2017: 24/30; 09/30/2017: 25/30; 11/03/2017: 25/30    Time  8    Period  Weeks    Status  On-going      PT LONG TERM GOAL #3   Title  Patient will improve 5xSTS to under 16 seconds without use of a pillow to indicate functional improvement of LE function and decreased fall risk    Baseline  19 sec with pillow under chair; 09/01/2017 12 seconds with use of a pillow; 09/30/2017: 10.9 sec with use of pillow; defered today secondary to increased soreness from walking the previous day    Time  8    Period  Weeks    Status  On-going      PT LONG TERM GOAL #4   Title  Patient will be able to balance for 10sec with SLS to improve static balance and ability to get dressed when in standing    Baseline  1 sec; 09/01/2017: 3 sec each leg; 09/30/17: 4.5; 11/03/2017: 5 sec    Time  8    Period  Weeks    Status  On-going            Plan - 12/29/17 1040    Clinical Impression Statement  Patient demonstrates increased ability to perform more challenging balance tasks. Patient requires verbal cueing to not look at her feet during standing/walking exercises indicating poor static/dynamic balance. For grape vinebalance exercise, patient improved with practice and stated that her confindence improved with the activity. Patient continues to require UE support during most balance activities demonstrating a further need for balance training. Patient will beenfit from continued skilled PT in order to return to PLOF.     Clinical Impairments Affecting Rehab Potential  Positive: motivation;  Negative: , age    PT Frequency  2x / week    PT Duration  6 weeks    PT Treatment/Interventions  ADLs/Self Care Home Management;Aquatic Therapy;Biofeedback;DME Instruction;Gait training;Stair training;Functional mobility training;Therapeutic activities;Therapeutic exercise;Balance training;Neuromuscular  re-education;Patient/family education;Passive range of motion;Energy conservation;Ultrasound;Moist Heat;Iontophoresis 4mg /ml Dexamethasone;Cryotherapy;Electrical Stimulation;Manual techniques;Dry needling    PT Next Visit Plan  Grape vine exercise with cueing to look up, rebounder or ball toss in rhomberg stance/tandem stance.     Consulted and Agree with Plan of Care  Patient       Patient will benefit from skilled therapeutic intervention in order to improve the following deficits and impairments:  Pain, Increased fascial restricitons, Decreased coordination, Decreased mobility, Decreased activity tolerance, Decreased endurance, Decreased range of motion, Decreased strength, Hypomobility, Postural dysfunction, Abnormal gait, Decreased balance, Difficulty walking  Visit Diagnosis: Muscle weakness (generalized)  Unsteadiness on feet  History of falling  Acute pain of right shoulder     Problem List Patient Active Problem List   Diagnosis Date Noted  . Hypomagnesemia 09/29/2017  . Hypokalemia 09/29/2017  . Atrial fibrillation ([place]) 09/10/2017  . Trapezius strain, right, initial encounter 09/10/2017  . Depression, major, single episode, mild ([place]) 09/10/2017  . Chronic venous insufficiency 09/09/2017  . Lymphedema 09/09/2017  . Pain of toe of left foot 08/17/2017  . Grief 08/17/2017  . Epistaxis 07/31/2017  . Leg swelling 07/31/2017  . Dry eyes 07/06/2017  . Pain and swelling of lower extremity, right 03/29/2017  . Right shoulder pain 03/29/2017  . Diarrhea 05/18/2016  . Fall 05/01/2016  . Unintentional weight loss 12/30/2015  . Leg weakness, bilateral 12/30/2015  .  Hyperkalemia 01/01/2015  . Occasional numbness/prickling/tingling of fingers and toes 12/18/2014  . Routine general medical examination at a health care facility 12/13/2013  . Macrocytic anemia 05/19/2013  . Psoriatic arthritis ([place]) 02/03/2013  . Psoriasis   . Osteoarthritis, multiple sites   . Episodic mood disorder ([place]) 12/01/2011  . History of breast cancer 10/28/2011  . GERD (gastroesophageal reflux disease)   . Hyperlipidemia   . Hypertension   . Osteopenia   . Carotid stenosis 05/27/2011    Georg Ruddle, SPT 12/29/2017, 11:24 AM  [place] [address], [place], [address] Phone: [phone]   Fax:  [phone]  Name: [name] MRN: [id number] Date of Birth: 03-08-1941

## 2017-12-30 ENCOUNTER — Ambulatory Visit: Payer: Medicare Other

## 2017-12-31 ENCOUNTER — Encounter: Payer: Self-pay | Admitting: Family Medicine

## 2017-12-31 ENCOUNTER — Ambulatory Visit (INDEPENDENT_AMBULATORY_CARE_PROVIDER_SITE_OTHER): Payer: Medicare Other | Admitting: Family Medicine

## 2017-12-31 VITALS — BP 120/80 | HR 54 | Temp 97.9°F | Ht 67.0 in | Wt 120.4 lb

## 2017-12-31 DIAGNOSIS — M545 Low back pain, unspecified: Secondary | ICD-10-CM | POA: Insufficient documentation

## 2017-12-31 DIAGNOSIS — Z853 Personal history of malignant neoplasm of breast: Secondary | ICD-10-CM

## 2017-12-31 DIAGNOSIS — F320 Major depressive disorder, single episode, mild: Secondary | ICD-10-CM

## 2017-12-31 DIAGNOSIS — G8929 Other chronic pain: Secondary | ICD-10-CM | POA: Diagnosis not present

## 2017-12-31 DIAGNOSIS — I4891 Unspecified atrial fibrillation: Secondary | ICD-10-CM | POA: Diagnosis not present

## 2017-12-31 NOTE — Assessment & Plan Note (Signed)
Currently asymptomatic.  Does have mild anxiety.  She will continue her current regimen.  Discussed potential drowsiness with lorazepam.

## 2017-12-31 NOTE — Progress Notes (Signed)
  [name], MD Phone: [phone]  [name] is a 77 y.o. female who presents today for f/u.  CC: afib, depression, chronic low back pain  Atrial fibrillation: Taking Xarelto, diltiazem, flecainide, and metoprolol.  She underwent cardioversion recently.  It took some time though she has started to feel significantly better.  She notes no fatigue.  No palpitations.  No chest pain or shortness of breath.  Depression: She notes no depressive symptoms.  Occasional anxiety.  She taking Remeron.  She takes lorazepam about twice a week in the middle the night.  No drowsiness.  No alcohol intake with this.  No SI or HI.  She has history of breast cancer status post treatment in 1990 or 1991.  Mammograms have been good.  She declines breast exam.  She has chronic low back pain that is a little stiffer than usual.  Not significantly worse.  Similar in character to prior back pain.  No radiation.  No numbness or weakness.  No incontinence.  She reports imaging previously through orthopedics.  Social History   Tobacco Use  Smoking Status Former Smoker  . Packs/day: 1.00  . Years: 40.00  . Pack years: 40.00  . Types: Cigarettes  . Last attempt to quit: 07/21/1995  . Years since quitting: 22.4  Smokeless Tobacco Never Used     ROS see history of present illness  Objective  Physical Exam Vitals:   12/31/17 0958  BP: 120/80  Pulse: (!) 54  Temp: 97.9 F (36.6 C)  SpO2: 98%    BP Readings from Last 3 Encounters:  12/31/17 120/80  12/16/17 121/78  12/02/17 120/82   Wt Readings from Last 3 Encounters:  12/31/17 120 lb 6.4 oz (54.6 kg)  12/16/17 118 lb (53.5 kg)  12/02/17 121 lb 12 oz (55.2 kg)    Physical Exam  Constitutional: No distress.  Cardiovascular: Normal heart sounds. An irregularly irregular rhythm present. Bradycardia present.  Pulmonary/Chest: Effort normal and breath sounds normal.  Musculoskeletal: She exhibits no edema.  No midline spine  tenderness, no midline spine step-off, no muscular back tenderness  Neurological: She is alert.  5/5 strength bilateral quads, hamstrings, plantar flexion, and dorsiflexion, sensation light touch intact bilateral lower extremities  Skin: Skin is warm and dry. She is not diaphoretic.     Assessment/Plan: Please see individual problem list.  History of breast cancer Recent mammogram with no abnormal findings.  She will continue with yearly mammograms.  She declines breast exam.  Atrial fibrillation ([place]) Sounds like she is still in A. fib.  She is rate controlled.  She is asymptomatic.  She will see cardiology as planned.  Depression, major, single episode, mild ([facility]) Currently asymptomatic.  Does have mild anxiety.  She will continue her current regimen.  Discussed potential drowsiness with lorazepam.  Chronic bilateral low back pain without sciatica Long history of this.  Mild stiffness noted.  No significant pain.  Benign exam.  Will refer for physical therapy for this.   Orders Placed This Encounter  Procedures  . Ambulatory referral to Physical Therapy    Referral Priority:   Routine    Referral Type:   Physical Medicine    Referral Reason:   Specialty Services Required    Requested Specialty:   Physical Therapy    Number of Visits Requested:   1    No orders of the defined types were placed in this encounter.    [name], MD [place]

## 2017-12-31 NOTE — Assessment & Plan Note (Signed)
Sounds like she is still in A. fib.  She is rate controlled.  She is asymptomatic.  She will see cardiology as planned.

## 2017-12-31 NOTE — Assessment & Plan Note (Signed)
Long history of this.  Mild stiffness noted.  No significant pain.  Benign exam.  Will refer for physical therapy for this.

## 2017-12-31 NOTE — Patient Instructions (Signed)
Nice to see you. Please see Dr. Caryl Comes as planned. We will get you physical therapy for your back. Please monitor your depression and if it worsens please let us know.  If you develop thoughts of harming yourself or others please go to the emergency room.

## 2017-12-31 NOTE — Assessment & Plan Note (Signed)
Recent mammogram with no abnormal findings.  She will continue with yearly mammograms.  She declines breast exam.

## 2018-01-03 ENCOUNTER — Ambulatory Visit: Payer: Self-pay | Admitting: Family Medicine

## 2018-01-03 ENCOUNTER — Ambulatory Visit: Payer: Medicare Other

## 2018-01-04 ENCOUNTER — Telehealth: Payer: Self-pay | Admitting: Internal Medicine

## 2018-01-04 NOTE — Telephone Encounter (Signed)
I called and spoke with the patient. She states she discussed with Dr. Caryl Comes that Xarelto is too expensive for her (around $500/ month) as well as Eliquis ($500/ month). She would like Pradaxa sent in as this is only about $220/ month for her. I advised her I will have [facility] clinic help to dose her Pradaxa and I will get this sent in for her. She is agreeable.  I have also advised her that I will pull patient assistance forms for her along with the L.I.S form and we will talk about these further at her office visit on 01/11/18. She is agreeable and voices understanding.    Mandy- Can you help me to ensure she is on the right dose of Pradaxa? She is currently on Xarelto 15 mg once daily.   Thanks!

## 2018-01-04 NOTE — Telephone Encounter (Signed)
°*  STAT* If patient is at the pharmacy, call can be transferred to refill team.   1. Which medications need to be refilled? (please list name of each medication and dose if known) Pt states she is to stop Xarelto and to start Pardaxa   2. Which pharmacy/location (including street and city if local Pharmacy) is medication to be sent to? [facility] [address]   3. Do they need a 30 day or 90 day supply? 90 day

## 2018-01-04 NOTE — Telephone Encounter (Signed)
Please advise for refill.  Patient states she was told to STOP Xarelto and START Pardaxa.  I do not see where Pardaxa is on patients medication list nor do I see where Xarelto was to be stopped.

## 2018-01-05 ENCOUNTER — Ambulatory Visit: Payer: Medicare Other

## 2018-01-05 DIAGNOSIS — M545 Low back pain: Secondary | ICD-10-CM | POA: Diagnosis not present

## 2018-01-05 DIAGNOSIS — M6281 Muscle weakness (generalized): Secondary | ICD-10-CM

## 2018-01-05 DIAGNOSIS — R2681 Unsteadiness on feet: Secondary | ICD-10-CM | POA: Diagnosis not present

## 2018-01-05 DIAGNOSIS — Z9181 History of falling: Secondary | ICD-10-CM

## 2018-01-05 DIAGNOSIS — M25511 Pain in right shoulder: Secondary | ICD-10-CM | POA: Diagnosis not present

## 2018-01-05 MED ORDER — DABIGATRAN ETEXILATE MESYLATE 150 MG PO CAPS: 150.0000 mg | ORAL_CAPSULE | Freq: Two times a day (BID) | ORAL | 3 refills | Status: DC

## 2018-01-05 NOTE — Telephone Encounter (Signed)
Crcl is about 29ml/min and she is not on an interacting medication thus Pradaxa 150mg  BID is the correct dose. She should start the Pradaxa at the time of her next due Xarelto dose.

## 2018-01-05 NOTE — Therapy (Signed)
[place] [facility] [address], [place], [address] Phone: [phone]   Fax:  [phone]  Physical Therapy Treatment  Patient Details  Name: [name] MRN: [id number] Date of Birth: 1941-05-06 Referring Provider: Caryl Bis MD   Encounter Date: 01/05/2018  PT End of Session - 01/05/18 1513    Visit Number  34    Number of Visits  40    Date for PT Re-Evaluation  01/17/18    Authorization Type  3/10    PT Start Time  1000    PT Stop Time  1043    PT Time Calculation (min)  43 min    Equipment Utilized During Treatment  Gait belt    Activity Tolerance  Patient tolerated treatment well    Behavior During Therapy  [facility] for tasks assessed/performed       Past Medical History:  Diagnosis Date  . (HFpEF) heart failure with preserved ejection fraction ([place])    a. 08/2017 Echo: EF 55-60%, no rwma, mild MR, mildly dil LA, nl RV fxn.  . Breast cancer ([place]) 2001   left breast  . Cancer ([place]) 2001   left breast ca  . Carotid arterial disease ([place])    a. 10/2004 s/p L CEA; b. 12/2015 Carotid U/S: RICA 1-39%; b. LICA patent CEA site.  Marland Kitchen GERD (gastroesophageal reflux disease)   . Hyperlipidemia   . Hypertension   . Osteoarthritis, multiple sites   . Osteopenia   . Persistent atrial fibrillation ([place])    a. Dx 08/2017; b. CHA2DS2VASc = 6-->Xarelto; c. 09/2017 Successful DCCV (second shock - 200J);   Marland Kitchen Personal history of radiation therapy 2001   left breast ca  . Psoriasis     Past Surgical History:  Procedure Laterality Date  . ABDOMINAL HYSTERECTOMY    . ANKLE FRACTURE SURGERY  4/08   left---hardware still in place  . 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 BREAST EXCISIONAL BIOPSY Left yrs ago   benign  . BREAST LUMPECTOMY Left 2001   f/u radiation  . CARDIOVERSION N/A 10/04/2017   Procedure: CARDIOVERSION;  Surgeon: Wellington Hampshire, MD;  Location: [facility] ORS;  Service: Cardiovascular;  Laterality:  N/A;  . CARDIOVERSION N/A 12/16/2017   Procedure: CARDIOVERSION;  Surgeon: [name], MD;  Location: [facility] ORS;  Service: Cardiovascular;  Laterality: N/A;  . CAROTID ENDARTERECTOMY Left 10/23/2004  . FRACTURE SURGERY    . OOPHORECTOMY    . SHOULDER SURGERY  6/07   left  . TONSILLECTOMY AND ADENOIDECTOMY    . TOTAL HIP ARTHROPLASTY  2004   right    There were no vitals filed for this visit.  Subjective Assessment - 01/05/18 1511    Subjective  Patient states that she is feeling well but that her back as been feeling more stiff recently (will have LBP evaluation tomorrow). Pt also states that she feels her balance is improving. No significant changes since last visit.Marland Kitchen    Pertinent History  Patient previously seen for balance difficulties and shoulder pain and the right.  Patient reports no falls in the past six months. Patient reports pain has not improved for the past month and has been about the same since the onset of pain.     Limitations  Walking    Patient Stated Goals  To improve balance when walking     Currently in Pain?  No/denies         TREATMENT Nu  step level 4 x46min SLS x5 B as long as possible (light UE support, VCs to look up) Grape vine 1m x4 increased stability, no LOB,  Modified tandem on airex (no UE support) TRX STS 10x2 Walking over hurdles (~4") fwd x5, bkwd x4 Rocker board lateral weight shifting (occasional UE support prn)  Overall improvements in balance, able to decrease UE support with balance activities and look up and not at feet.     PT Education - 01/05/18 1512    Education provided  Yes    Education Details  Pt educated on proper form and technique for therapeutic exercises.     Person(s) Educated  Patient    Methods  Explanation;Demonstration    Comprehension  Verbalized understanding          PT Long Term Goals - 11/03/17 1046      PT LONG TERM GOAL #1   Title  Patient will be independent with HEP to continue benefits of  therapy after discharge.     Baseline  Dependent with form and technique for balancing exercises; moderate cueing for balance and strengthening    Time  4    Period  Weeks    Status  On-going      PT LONG TERM GOAL #2   Title  Patient will improve FGA to over 26/30 to indicate functional improvement in strength and balance limitations and decrease fall risk    Baseline  22/30; ; 09/01/2017: 24/30; 09/30/2017: 25/30; 11/03/2017: 25/30    Time  8    Period  Weeks    Status  On-going      PT LONG TERM GOAL #3   Title  Patient will improve 5xSTS to under 16 seconds without use of a pillow to indicate functional improvement of LE function and decreased fall risk    Baseline  19 sec with pillow under chair; 09/01/2017 12 seconds with use of a pillow; 09/30/2017: 10.9 sec with use of pillow; defered today secondary to increased soreness from walking the previous day    Time  8    Period  Weeks    Status  On-going      PT LONG TERM GOAL #4   Title  Patient will be able to balance for 10sec with SLS to improve static balance and ability to get dressed when in standing    Baseline  1 sec; 09/01/2017: 3 sec each leg; 09/30/17: 4.5; 11/03/2017: 5 sec    Time  8    Period  Weeks    Status  On-going            Plan - 01/05/18 1514    Clinical Impression Statement  Patient demonstrates improvements with balance activities. Improvement is seen in patients ability to decrease UE support and look up instead of looking at feet. Less cueing was needed today to look fwd. Pt will continue to benefit from skilled PT in order to return to prior level of function.    Rehab Potential  Good    Clinical Impairments Affecting Rehab Potential  Positive: motivation; Negative: , age    PT Frequency  2x / week    PT Duration  6 weeks    PT Treatment/Interventions  ADLs/Self Care Home Management;Aquatic Therapy;Biofeedback;DME Instruction;Gait training;Stair training;Functional mobility training;Therapeutic  activities;Therapeutic exercise;Balance training;Neuromuscular re-education;Patient/family education;Passive range of motion;Energy conservation;Ultrasound;Moist Heat;Iontophoresis 4mg /ml Dexamethasone;Cryotherapy;Electrical Stimulation;Manual techniques;Dry needling    PT Next Visit Plan  rebounder or ball toss in tandem or rhomberg stance,  PT Home Exercise Plan  See education section    Consulted and Agree with Plan of Care  Patient       Patient will benefit from skilled therapeutic intervention in order to improve the following deficits and impairments:  Pain, Increased fascial restricitons, Decreased coordination, Decreased mobility, Decreased activity tolerance, Decreased endurance, Decreased range of motion, Decreased strength, Hypomobility, Postural dysfunction, Abnormal gait, Decreased balance, Difficulty walking  Visit Diagnosis: Muscle weakness (generalized)  Unsteadiness on feet  History of falling     Problem List Patient Active Problem List   Diagnosis Date Noted  . Chronic bilateral low back pain without sciatica 12/31/2017  . Hypomagnesemia 09/29/2017  . Hypokalemia 09/29/2017  . Atrial fibrillation ([place]) 09/10/2017  . Trapezius strain, right, initial encounter 09/10/2017  . Depression, major, single episode, mild ([place]) 09/10/2017  . Chronic venous insufficiency 09/09/2017  . Lymphedema 09/09/2017  . Pain of toe of left foot 08/17/2017  . Grief 08/17/2017  . Epistaxis 07/31/2017  . Leg swelling 07/31/2017  . Dry eyes 07/06/2017  . Pain and swelling of lower extremity, right 03/29/2017  . Right shoulder pain 03/29/2017  . Diarrhea 05/18/2016  . Fall 05/01/2016  . Unintentional weight loss 12/30/2015  . Leg weakness, bilateral 12/30/2015  . Hyperkalemia 01/01/2015  . Occasional numbness/prickling/tingling of fingers and toes 12/18/2014  . Routine general medical examination at a health care facility 12/13/2013  . Macrocytic anemia 05/19/2013  . Psoriatic  arthritis ([place]) 02/03/2013  . Psoriasis   . Osteoarthritis, multiple sites   . Episodic mood disorder ([place]) 12/01/2011  . History of breast cancer 10/28/2011  . GERD (gastroesophageal reflux disease)   . Hyperlipidemia   . Hypertension   . Osteopenia   . Carotid stenosis 05/27/2011    Blythe Stanford, SPT 01/06/2018, 10:11 AM  [place] [facility] [address], [place], [address] Phone: [phone]   Fax:  [phone]  Name: [name] MRN: [id number] Date of Birth: 1941-04-01

## 2018-01-05 NOTE — Telephone Encounter (Signed)
I left a message for the patient on her cell vm (ok per DPR), that her pradaxa 150 mg BID has been sent to the pharmacy and she will need to start this when in place of her next dose of xarelto.  I asked that she call back with any further questions or concerns.

## 2018-01-05 NOTE — Telephone Encounter (Signed)
Routing to pharmacy. 

## 2018-01-06 ENCOUNTER — Ambulatory Visit: Payer: Medicare Other

## 2018-01-06 DIAGNOSIS — M6281 Muscle weakness (generalized): Secondary | ICD-10-CM

## 2018-01-06 DIAGNOSIS — R2681 Unsteadiness on feet: Secondary | ICD-10-CM | POA: Diagnosis not present

## 2018-01-06 DIAGNOSIS — Z9181 History of falling: Secondary | ICD-10-CM

## 2018-01-06 DIAGNOSIS — M545 Low back pain: Secondary | ICD-10-CM | POA: Diagnosis not present

## 2018-01-06 DIAGNOSIS — M25511 Pain in right shoulder: Secondary | ICD-10-CM | POA: Diagnosis not present

## 2018-01-06 NOTE — Therapy (Signed)
[facility] [address], [place], [address] Phone: [phone]   Fax:  [phone]  Physical Therapy Evaluation  Patient Details  Name: [name] MRN: [id number] Date of Birth: 24-Sep-1940 Referring Provider: Caryl Bis MD   Encounter Date: 01/06/2018  PT End of Session - 01/06/18 1640    Visit Number  1    Number of Visits  13    Date for PT Re-Evaluation  02/17/18    Authorization Type  1/10 Progress note    PT Start Time  3536    PT Stop Time  1610    PT Time Calculation (min)  55 min    Equipment Utilized During Treatment  Gait belt    Activity Tolerance  Patient tolerated treatment well    Behavior During Therapy  [facility] for tasks assessed/performed       Past Medical History:  Diagnosis Date  . (HFpEF) heart failure with preserved ejection fraction ([place])    a. 08/2017 Echo: EF 55-60%, no rwma, mild MR, mildly dil LA, nl RV fxn.  . Breast cancer ([place]) 2001   left breast  . Cancer ([place]) 2001   left breast ca  . Carotid arterial disease ([place])    a. 10/2004 s/p L CEA; b. 12/2015 Carotid U/S: RICA 1-39%; b. LICA patent CEA site.  Marland Kitchen GERD (gastroesophageal reflux disease)   . Hyperlipidemia   . Hypertension   . Osteoarthritis, multiple sites   . Osteopenia   . Persistent atrial fibrillation ([place])    a. Dx 08/2017; b. CHA2DS2VASc = 6-->Xarelto; c. 09/2017 Successful DCCV (second shock - 200J);   Marland Kitchen Personal history of radiation therapy 2001   left breast ca  . Psoriasis     Past Surgical History:  Procedure Laterality Date  . ABDOMINAL HYSTERECTOMY    . ANKLE FRACTURE SURGERY  4/08   left---hardware still in place  . 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 BREAST EXCISIONAL BIOPSY Left yrs ago   benign  . BREAST LUMPECTOMY Left 2001   f/u radiation  . CARDIOVERSION N/A 10/04/2017   Procedure: CARDIOVERSION;  Surgeon: Wellington Hampshire, MD;  Location: [facility] ORS;  Service: Cardiovascular;   Laterality: N/A;  . CARDIOVERSION N/A 12/16/2017   Procedure: CARDIOVERSION;  Surgeon: [name], MD;  Location: [facility] ORS;  Service: Cardiovascular;  Laterality: N/A;  . CAROTID ENDARTERECTOMY Left 10/23/2004  . FRACTURE SURGERY    . OOPHORECTOMY    . SHOULDER SURGERY  6/07   left  . TONSILLECTOMY AND ADENOIDECTOMY    . TOTAL HIP ARTHROPLASTY  2004   right    There were no vitals filed for this visit.   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 Patient reports the issue is chronic as she had this condition since 1996. Patient states the low back hurts a greater amount after sitting for 30 min and lying for a long period of time and trying to move after awhile. Patient states the increased pain lasts for 30 min after moving around the house and walking.  Patient states increased sitting during driving also increases her pain. Patient reports her pain is in a band formation across the lumbar spine.      Pertinent History  Patient previously seen for balance difficulties and shoulder pain and the right.  Patient reports no falls  in the past six months. Patient reports pain has not improved for the past month and has been about the same since the onset of pain.     Limitations  Walking    Patient Stated Goals  To improve balance when walking     Currently in Pain?  Yes    Pain Score  0-No pain worst: 8/10     Pain Location  Back    Pain Orientation  Right;Left;Lower    Pain Descriptors / Indicators  Aching    Pain Type  Chronic pain;Acute pain    Pain Onset  More than a month ago    Pain Frequency  Intermittent    Multiple Pain Sites  No         [facility] PT Assessment - 01/06/18 1526      Assessment   Medical Diagnosis  Low back pain    Referring Provider  Sonnenberg MD    Onset Date/Surgical Date  11/01/17    Hand Dominance  Right    Next MD Visit   unknown    Prior Therapy  yes - low back pain      Precautions   Precautions  None      Balance Screen   Has the patient fallen in the past 6 months  No    Has the patient had a decrease in activity level because of a fear of falling?   No    Is the patient reluctant to leave their home because of a fear of falling?   No      Home Social worker  Private residence    Living Arrangements  Alone    Available Help at Discharge  Family    Type of Home  Other(Comment)    Home Access  Level entry    Home Layout  One level    [place]  None      Prior Function   Level of [place]  Retired    Tree surgeon, reading, play games      Cognition   Overall Cognitive Status  Within Functional Limits for tasks assessed      Observation/Other Assessments   Observations  Increased lumbar flexion in standing      Sensation   Light Touch  Appears Intact      Posture/Postural Control   Posture Comments  Increased FHP, forward rounded shoulders, increased lumbar flexion in standing       ROM / Strength   AROM / PROM / Strength  AROM;Strength      AROM   AROM Assessment Site  Hip;Lumbar    Right/Left Hip  Right;Left    Right Hip Extension  10    Right Hip Flexion  100 increased pain    Right Hip External Rotation   10    Right Hip Internal Rotation   40    Right Hip ABduction  40    Left Hip Flexion  110    Left Hip External Rotation   25    Left Hip Internal Rotation   30    Left Hip ABduction  40    Left Hip ADduction  20    Lumbar Flexion  WNL    Lumbar Extension  50% limited     Lumbar - Right Side Bend  33% limited pain    Lumbar - Left Side Bend  33% limited  pain    Lumbar - Right Rotation  WNL    Lumbar - Left Rotation  WNL      Strength   Strength Assessment Site  Hip;Knee;Ankle    Right Hip Flexion  4-/5    Right Hip External Rotation   2+/5    Right Hip Internal  Rotation  4+/5    Right Hip ABduction  4+/5    Left Hip Flexion  4/5    Left Hip External Rotation  4/5    Left Hip Internal Rotation  4+/5    Left Hip ABduction  4/5    Right Knee Flexion  5/5    Right Knee Extension  5/5    Left Knee Flexion  5/5    Left Knee Extension  5/5      Palpation   Palpation comment  TTP and hypomobility: L3-S1      Special Tests    Special Tests  Lumbar    Other special tests  Positive for pain with hip extension    Lumbar Tests  Prone Knee Bend Test      Prone Knee Bend Test   Findings  Positive    Side  -- Bilat    Comment  Positive for tightness      Ambulation/Gait   Gait Comments  L antalgic gait, decreased hip extension in terminal stance, slight increase in lumbar flexion      Objective measurements completed on examination: See above findings.    TREATMENT Therapeutic Exercise: Lumbar extension in standing -- x 20 Seated multifidus crunch -- x 20   Patient demonstrates no increase in pain at the end of the session    PT Education - 01/06/18 1640    Education provided  Yes    Education Details  POC; HEP: standing CKC lumbar extension    Person(s) Educated  Patient    Methods  Explanation;Demonstration    Comprehension  Verbalized understanding;Returned demonstration          PT Long Term Goals - 01/06/18 1645      PT LONG TERM GOAL #1   Title  Patient will be independent with HEP to continue benefits of therapy after discharge.     Baseline  Dependent with form and technique for balancing exercises; moderate cueing for balance and strengthening    Time  4    Period  Weeks    Status  On-going      PT LONG TERM GOAL #2   Title  Patient will improve FGA to over 26/30 to indicate functional improvement in strength and balance limitations and decrease fall risk    Baseline  22/30; ; 09/01/2017: 24/30; 09/30/2017: 25/30; 11/03/2017: 25/30    Time  8    Period  Weeks    Status  On-going      PT LONG TERM GOAL #3   Title   Patient will improve 5xSTS to under 16 seconds without use of a pillow to indicate functional improvement of LE function and decreased fall risk    Baseline  19 sec with pillow under chair; 09/01/2017 12 seconds with use of a pillow; 09/30/2017: 10.9 sec with use of pillow; defered today secondary to increased soreness from walking the previous day    Time  8    Period  Weeks    Status  On-going      PT LONG TERM GOAL #4   Title  Patient will be able to balance for 10sec with SLS to improve  static balance and ability to get dressed when in standing    Baseline  1 sec; 09/01/2017: 3 sec each leg; 09/30/17: 4.5; 11/03/2017: 5 sec    Time  8    Period  Weeks    Status  On-going      PT LONG TERM GOAL #5   Title  Patient will have a worst lumbar pain of a 3/10 over the past week to indicate functional improvement with performing standing activities and prolonged sitting.    Baseline  Worst pain: 8/10    Time  6    Period  Weeks    Status  New      Additional Long Term Goals   Additional Long Term Goals  Yes      PT LONG TERM GOAL #6   Title  Patient will be able to sit for >1 hour to improve abiltity to drive without increase in pain.    Baseline  Increased pain after driving for <82NKN    Time  6    Period  Weeks    Status  New             Plan - 01/06/18 1641    Clinical Impression Statement  Patient is a 77 yo right hand dominant female presenting with acute on chronic LBP with the most recent episode starting in the beginning of May 2019. Patient demonstrates increased lumbar dysfunction with possible involvement of the multifidus and decreased hip mobility. Patient demonstrates decreased anterior pelvic tilts and lumbar extension in standing indicating increased dysfunction. Patient also demonstrates decreased strength along her posterior core and hip musculature and will benefit from further skilled therapy focused on improving limitations to return to prior level of function.      Rehab Potential  Good    Clinical Impairments Affecting Rehab Potential  Positive: motivation; Negative: , age    PT Frequency  2x / week    PT Duration  6 weeks    PT Treatment/Interventions  ADLs/Self Care Home Management;Aquatic Therapy;Biofeedback;DME Instruction;Gait training;Stair training;Functional mobility training;Therapeutic activities;Therapeutic exercise;Balance training;Neuromuscular re-education;Patient/family education;Passive range of motion;Energy conservation;Ultrasound;Moist Heat;Iontophoresis 4mg /ml Dexamethasone;Cryotherapy;Electrical Stimulation;Manual techniques;Dry needling    PT Next Visit Plan  rebounder or ball toss in tandem or rhomberg stance,     PT Home Exercise Plan  See education section    Consulted and Agree with Plan of Care  Patient       Patient will benefit from skilled therapeutic intervention in order to improve the following deficits and impairments:  Pain, Increased fascial restricitons, Decreased coordination, Decreased mobility, Decreased activity tolerance, Decreased endurance, Decreased range of motion, Decreased strength, Hypomobility, Postural dysfunction, Abnormal gait, Decreased balance, Difficulty walking  Visit Diagnosis: Muscle weakness (generalized)  Unsteadiness on feet  History of falling     Problem List Patient Active Problem List   Diagnosis Date Noted  . Chronic bilateral low back pain without sciatica 12/31/2017  . Hypomagnesemia 09/29/2017  . Hypokalemia 09/29/2017  . Atrial fibrillation ([place]) 09/10/2017  . Trapezius strain, right, initial encounter 09/10/2017  . Depression, major, single episode, mild ([place]) 09/10/2017  . Chronic venous insufficiency 09/09/2017  . Lymphedema 09/09/2017  . Pain of toe of left foot 08/17/2017  . Grief 08/17/2017  . Epistaxis 07/31/2017  . Leg swelling 07/31/2017  . Dry eyes 07/06/2017  . Pain and swelling of lower extremity, right 03/29/2017  . Right shoulder pain 03/29/2017  .  Diarrhea 05/18/2016  . Fall 05/01/2016  . Unintentional weight loss 12/30/2015  .  Leg weakness, bilateral 12/30/2015  . Hyperkalemia 01/01/2015  . Occasional numbness/prickling/tingling of fingers and toes 12/18/2014  . Routine general medical examination at a health care facility 12/13/2013  . Macrocytic anemia 05/19/2013  . Psoriatic arthritis ([place]) 02/03/2013  . Psoriasis   . Osteoarthritis, multiple sites   . Episodic mood disorder ([place]) 12/01/2011  . History of breast cancer 10/28/2011  . GERD (gastroesophageal reflux disease)   . Hyperlipidemia   . Hypertension   . Osteopenia   . Carotid stenosis 05/27/2011    Blythe Stanford, PT DPT 01/06/2018, 4:57 PM  [facility] [address], [place], [address] Phone: [phone]   Fax:  [phone]  Name: [name] MRN: [id number] Date of Birth: 04/02/1941

## 2018-01-08 ENCOUNTER — Other Ambulatory Visit: Payer: Self-pay | Admitting: Internal Medicine

## 2018-01-10 ENCOUNTER — Ambulatory Visit: Payer: Medicare Other

## 2018-01-10 DIAGNOSIS — Z9181 History of falling: Secondary | ICD-10-CM

## 2018-01-10 DIAGNOSIS — R2681 Unsteadiness on feet: Secondary | ICD-10-CM

## 2018-01-10 DIAGNOSIS — M545 Low back pain, unspecified: Secondary | ICD-10-CM

## 2018-01-10 DIAGNOSIS — M6281 Muscle weakness (generalized): Secondary | ICD-10-CM | POA: Diagnosis not present

## 2018-01-10 DIAGNOSIS — M25511 Pain in right shoulder: Secondary | ICD-10-CM | POA: Diagnosis not present

## 2018-01-10 NOTE — Therapy (Signed)
[facility] [address], [place], [address] Phone: [phone]   Fax:  [phone]  Physical Therapy Treatment  Patient Details  Name: [name] MRN: [id number] Date of Birth: 01/01/1941 Referring Provider: Caryl Bis MD   Encounter Date: 01/10/2018  PT End of Session - 01/10/18 1549    Visit Number  2    Number of Visits  13    Date for PT Re-Evaluation  02/17/18    Authorization Type  2/10 Progress note    PT Start Time  1300    PT Stop Time  1345    PT Time Calculation (min)  45 min    Equipment Utilized During Treatment  Gait belt    Activity Tolerance  Patient tolerated treatment well    Behavior During Therapy  [facility] for tasks assessed/performed       Past Medical History:  Diagnosis Date  . (HFpEF) heart failure with preserved ejection fraction ([place])    a. 08/2017 Echo: EF 55-60%, no rwma, mild MR, mildly dil LA, nl RV fxn.  . Breast cancer ([place]) 2001   left breast  . Cancer ([place]) 2001   left breast ca  . Carotid arterial disease ([place])    a. 10/2004 s/p L CEA; b. 12/2015 Carotid U/S: RICA 1-39%; b. LICA patent CEA site.  Marland Kitchen GERD (gastroesophageal reflux disease)   . Hyperlipidemia   . Hypertension   . Osteoarthritis, multiple sites   . Osteopenia   . Persistent atrial fibrillation ([place])    a. Dx 08/2017; b. CHA2DS2VASc = 6-->Xarelto; c. 09/2017 Successful DCCV (second shock - 200J);   Marland Kitchen Personal history of radiation therapy 2001   left breast ca  . Psoriasis     Past Surgical History:  Procedure Laterality Date  . ABDOMINAL HYSTERECTOMY    . ANKLE FRACTURE SURGERY  4/08   left---hardware still in place  . 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 BREAST EXCISIONAL BIOPSY Left yrs ago   benign  . BREAST LUMPECTOMY Left 2001   f/u radiation  . CARDIOVERSION N/A 10/04/2017   Procedure: CARDIOVERSION;  Surgeon: Wellington Hampshire, MD;  Location: [facility] ORS;  Service: Cardiovascular;   Laterality: N/A;  . CARDIOVERSION N/A 12/16/2017   Procedure: CARDIOVERSION;  Surgeon: [name], MD;  Location: [facility] ORS;  Service: Cardiovascular;  Laterality: N/A;  . CAROTID ENDARTERECTOMY Left 10/23/2004  . FRACTURE SURGERY    . OOPHORECTOMY    . SHOULDER SURGERY  6/07   left  . TONSILLECTOMY AND ADENOIDECTOMY    . TOTAL HIP ARTHROPLASTY  2004   right    There were no vitals filed for this visit.  Subjective Assessment - 01/10/18 1546    Subjective  Patient reports that back has been "feeling rough" over the past week with no significant changes since the previous session. Pt also reports that she is stil feeling still in the mornings.     Pertinent History  Patient previously seen for balance difficulties and shoulder pain and the right.  Patient reports no falls in the past six months. Patient reports pain has not improved for the past month and has been about the same since the onset of pain.     Limitations  Walking    Patient Stated Goals  To improve balance when walking     Currently in Pain?  Yes    Pain Score  5     Pain Orientation  Lower;Right;Left    Pain Descriptors / Indicators  Aching    Pain Type  Chronic pain;Acute pain    Pain Onset  More than a month ago    Pain Frequency  Intermittent       TREATMENT Therapeutic Exercise LTRs x20 Bridges (pain free ROM) x20 SKTC LE marches with core engaged [employer] with red physio ball above head 10x2 Standing lumbar extension x20  Standing hip abduction B x20 TRX sit to stand with focus on full hip extension 20x2  Manual Therapy STM to L gluteal insertion on iliac crest x5 min to decrease pain and muscle spasm   Patient fatigued at end of session, no increase in pain with therapeutic exercises.    PT Education - 01/10/18 1548    Education Details  Patient educated on proper form and technique for therapeutic exercises. Pt also given one additional exercise to add to HEP.    Person(s) Educated  Patient     Methods  Explanation;Demonstration;Handout    Comprehension  Verbalized understanding;Returned demonstration          PT Long Term Goals - 01/06/18 1645      PT LONG TERM GOAL #1   Title  Patient will be independent with HEP to continue benefits of therapy after discharge.     Baseline  Dependent with form and technique for balancing exercises; moderate cueing for balance and strengthening    Time  4    Period  Weeks    Status  On-going      PT LONG TERM GOAL #2   Title  Patient will improve FGA to over 26/30 to indicate functional improvement in strength and balance limitations and decrease fall risk    Baseline  22/30; ; 09/01/2017: 24/30; 09/30/2017: 25/30; 11/03/2017: 25/30    Time  8    Period  Weeks    Status  On-going      PT LONG TERM GOAL #3   Title  Patient will improve 5xSTS to under 16 seconds without use of a pillow to indicate functional improvement of LE function and decreased fall risk    Baseline  19 sec with pillow under chair; 09/01/2017 12 seconds with use of a pillow; 09/30/2017: 10.9 sec with use of pillow; defered today secondary to increased soreness from walking the previous day    Time  8    Period  Weeks    Status  On-going      PT LONG TERM GOAL #4   Title  Patient will be able to balance for 10sec with SLS to improve static balance and ability to get dressed when in standing    Baseline  1 sec; 09/01/2017: 3 sec each leg; 09/30/17: 4.5; 11/03/2017: 5 sec    Time  8    Period  Weeks    Status  On-going      PT LONG TERM GOAL #5   Title  Patient will have a worst lumbar pain of a 3/10 over the past week to indicate functional improvement with performing standing activities and prolonged sitting.    Baseline  Worst pain: 8/10    Time  6    Period  Weeks    Status  New      Additional Long Term Goals   Additional Long Term Goals  Yes      PT LONG TERM GOAL #6   Title  Patient will be able to sit for >1 hour to improve abiltity to drive without  increase in pain.    Baseline  Increased pain after driving for <00BBC    Time  6    Period  Weeks    Status  New            Plan - 01/10/18 1550    Clinical Impression Statement  Patient presents with decreased core and hip strength as demonstrated in ability to perform bridging exercise and hip abduction exercise with no resistance. Pt also requires verbal cueing for core activation and proper techniqe for therapeutic exercises. Pt presents with tenderness to palpation along L gluteal insertion on iliac crest.    Rehab Potential  Good    Clinical Impairments Affecting Rehab Potential  Positive: motivation; Negative: , age    PT Frequency  2x / week    PT Duration  6 weeks    PT Treatment/Interventions  ADLs/Self Care Home Management;Aquatic Therapy;Biofeedback;DME Instruction;Gait training;Stair training;Functional mobility training;Therapeutic activities;Therapeutic exercise;Balance training;Neuromuscular re-education;Patient/family education;Passive range of motion;Energy conservation;Ultrasound;Moist Heat;Iontophoresis 4mg /ml Dexamethasone;Cryotherapy;Electrical Stimulation;Manual techniques;Dry needling    PT Next Visit Plan  STM to L hip, hip strengthening, progress core exercises (dead bug, marches, palof press low resistance)    PT Home Exercise Plan  See education section    Consulted and Agree with Plan of Care  Patient       Patient will benefit from skilled therapeutic intervention in order to improve the following deficits and impairments:  Pain, Increased fascial restricitons, Decreased coordination, Decreased mobility, Decreased activity tolerance, Decreased endurance, Decreased range of motion, Decreased strength, Hypomobility, Postural dysfunction, Abnormal gait, Decreased balance, Difficulty walking  Visit Diagnosis: Muscle weakness (generalized)  Unsteadiness on feet  History of falling  Acute bilateral low back pain without sciatica     Problem  List Patient Active Problem List   Diagnosis Date Noted  . Chronic bilateral low back pain without sciatica 12/31/2017  . Hypomagnesemia 09/29/2017  . Hypokalemia 09/29/2017  . Atrial fibrillation ([place]) 09/10/2017  . Trapezius strain, right, initial encounter 09/10/2017  . Depression, major, single episode, mild ([place]) 09/10/2017  . Chronic venous insufficiency 09/09/2017  . Lymphedema 09/09/2017  . Pain of toe of left foot 08/17/2017  . Grief 08/17/2017  . Epistaxis 07/31/2017  . Leg swelling 07/31/2017  . Dry eyes 07/06/2017  . Pain and swelling of lower extremity, right 03/29/2017  . Right shoulder pain 03/29/2017  . Diarrhea 05/18/2016  . Fall 05/01/2016  . Unintentional weight loss 12/30/2015  . Leg weakness, bilateral 12/30/2015  . Hyperkalemia 01/01/2015  . Occasional numbness/prickling/tingling of fingers and toes 12/18/2014  . Routine general medical examination at a health care facility 12/13/2013  . Macrocytic anemia 05/19/2013  . Psoriatic arthritis ([place]) 02/03/2013  . Psoriasis   . Osteoarthritis, multiple sites   . Episodic mood disorder ([place]) 12/01/2011  . History of breast cancer 10/28/2011  . GERD (gastroesophageal reflux disease)   . Hyperlipidemia   . Hypertension   . Osteopenia   . Carotid stenosis 05/27/2011    Georg Ruddle, SPT 01/10/2018, 3:56 PM  [facility] [address], [place], [address] Phone: [phone]   Fax:  [phone]  Name: [name] MRN: [id number] Date of Birth: 03/27/1941

## 2018-01-11 ENCOUNTER — Encounter: Payer: Self-pay | Admitting: Internal Medicine

## 2018-01-11 ENCOUNTER — Ambulatory Visit (INDEPENDENT_AMBULATORY_CARE_PROVIDER_SITE_OTHER): Payer: Medicare Other | Admitting: Internal Medicine

## 2018-01-11 VITALS — BP 122/84 | HR 43 | Ht 66.5 in | Wt 121.5 lb

## 2018-01-11 DIAGNOSIS — I4819 Other persistent atrial fibrillation: Secondary | ICD-10-CM

## 2018-01-11 DIAGNOSIS — R001 Bradycardia, unspecified: Secondary | ICD-10-CM | POA: Diagnosis not present

## 2018-01-11 DIAGNOSIS — I481 Persistent atrial fibrillation: Secondary | ICD-10-CM

## 2018-01-11 NOTE — Patient Instructions (Addendum)
Medication Instructions: - Your physician has recommended you make the following change in your medication:  1) STOP cardizem (diltiazem)  2) at the end of the week, check your heart rate and if it is < 55, DECREASE metoprolol tartrate 50 mg- to 1/2 tablet (25 mg) twice daily  Labwork: - none ordered  Procedures/Testing: - none ordered  Follow-Up: - Your physician recommends that you schedule a follow-up appointment in: 3 months with Dr. Caryl Comes.   Any Additional Special Instructions Will Be Listed Below (If Applicable).     If you need a refill on your cardiac medications before your next appointment, please call your pharmacy.

## 2018-01-11 NOTE — Progress Notes (Signed)
Patient Care Team: [name], MD as PCP - General (Family Medicine) [name], MD as PCP - Cardiology (Cardiology)   HPI  [name] is a 77 y.o. female Seen in follow-up for recent hospitalization having presented with acute shortness of breath and found to be in rapid atrial fibrillation.  Rate control was accomplished with beta-blockers and calcium blockers and anticoagulation with Xarelto.  She has had recurrent atrial fib with RVR  UnderwentDCCV with rapid reversion  Started flecainide 5/19  DATE PR interval QRSduration Dose-Flec  5/19  NA 78 0  6/19 210 84 50  \]     DATE TEST EF   2/19 Echo   65 % LAE (48/2.8/46)         Date Cr K Hgb  2/19 0.8 3.9 12.5  5/19  1.12 3.8     Thromboembolic risk factors ( age  -2, HTN-1, Vasc disease -1, Gender-1) for a CHADSVASc Score of 5  Records and Results Reviewed hosp records  She has more energy.  No edema  No bleeding  Struggled with Fathers day and anniversary is coming   She is [name] his wife.  Son, [name], is an Pharmacologist. Past Medical History:  Diagnosis Date  . (HFpEF) heart failure with preserved ejection fraction ([place])    a. 08/2017 Echo: EF 55-60%, no rwma, mild MR, mildly dil LA, nl RV fxn.  . Breast cancer ([place]) 2001   left breast  . Cancer ([place]) 2001   left breast ca  . Carotid arterial disease ([place])    a. 10/2004 s/p L CEA; b. 12/2015 Carotid U/S: RICA 1-39%; b. LICA patent CEA site.  Marland Kitchen GERD (gastroesophageal reflux disease)   . Hyperlipidemia   . Hypertension   . Osteoarthritis, multiple sites   . Osteopenia   . Persistent atrial fibrillation ([place])    a. Dx 08/2017; b. CHA2DS2VASc = 6-->Xarelto; c. 09/2017 Successful DCCV (second shock - 200J);   Marland Kitchen Personal history of radiation therapy 2001   left breast ca  . Psoriasis     Past Surgical History:  Procedure Laterality Date  . ABDOMINAL HYSTERECTOMY    . ANKLE FRACTURE SURGERY  4/08   left---hardware  still in place  . 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 BREAST EXCISIONAL BIOPSY Left yrs ago   benign  . BREAST LUMPECTOMY Left 2001   f/u radiation  . CARDIOVERSION N/A 10/04/2017   Procedure: CARDIOVERSION;  Surgeon: Wellington Hampshire, MD;  Location: [facility] ORS;  Service: Cardiovascular;  Laterality: N/A;  . CARDIOVERSION N/A 12/16/2017   Procedure: CARDIOVERSION;  Surgeon: [name], MD;  Location: [facility] ORS;  Service: Cardiovascular;  Laterality: N/A;  . CAROTID ENDARTERECTOMY Left 10/23/2004  . FRACTURE SURGERY    . OOPHORECTOMY    . SHOULDER SURGERY  6/07   left  . TONSILLECTOMY AND ADENOIDECTOMY    . TOTAL HIP ARTHROPLASTY  2004   right    Current Outpatient Medications  Medication Sig Dispense Refill  . Cholecalciferol (VITAMIN D3) 1000 units CAPS Take 1 capsule by mouth 2 (two) times daily.     . dabigatran (PRADAXA) 150 MG CAPS capsule Take 1 capsule (150 mg total) by mouth 2 (two) times daily. 180 capsule 3  . diltiazem (CARDIZEM CD) 120 MG 24 hr capsule TAKE 1 CAPSULE BY MOUTH EVERY DAY 30 capsule 3  . flecainide (TAMBOCOR) 50 MG tablet Take 1 tablet (50 mg total)  by mouth 2 (two) times daily. 60 tablet 3  . furosemide (LASIX) 40 MG tablet Take 1 tablet (40 mg total) by mouth daily. 30 tablet 3  . LORazepam (ATIVAN) 0.5 MG tablet TAKE 1 TABLET BY MOUTH 2 TIMES DAILY AS NEEDED FOR ANXIETY 60 tablet 0  . metoprolol tartrate (LOPRESSOR) 50 MG tablet Take 1 tablet (50 mg total) by mouth 2 (two) times daily. 180 tablet 3  . mirtazapine (REMERON) 15 MG tablet TAKE 1 TABLET (15 MG TOTAL) BY MOUTH AT BEDTIME. 30 tablet 3  . Multiple Vitamin (MULTIVITAMIN) capsule Take 1 capsule by mouth daily.      . Multiple Vitamins-Minerals (PRESERVISION AREDS 2) CAPS Take 1 tablet by mouth 2 (two) times daily.     No current facility-administered medications for this visit.     No Known Allergies    Review of Systems negative except from HPI and PMH  Physical Exam Ht 5' 6.5"  (1.689 m)   Wt 121 lb 8 oz (55.1 kg)   BMI 19.32 kg/m  Well developed and nourished in no acute distress HENT normal Neck supple with JVP-flat Clear Regular rate and rhythm, no murmurs or gallops Abd-soft with active BS No Clubbing cyanosis edema Skin-warm and dry A & Oriented  Grossly normal sensory and motor function    ECG sinus at 43 21/08/51  Assessment and  Plan  Atrial fibrillation-persistent with a rapid rate  Sinus brady  Grief  Peripheral vascular disease carotid artery occlusion  On Anticoagulation;  No bleeding issues   Tolerating dabigitran without GI issues  Bradycardia may be troublesome and need pacing but for now will try and use rate meds prn   Will stop dilt and have her use when Af recurs  If HR < 55 next week will decrease lopressor 50>> 25 bid  Euvolemic continue current meds  We spent more than 50% of our >25 min visit in face to face counseling regarding the above

## 2018-01-12 ENCOUNTER — Ambulatory Visit: Payer: Medicare Other

## 2018-01-12 DIAGNOSIS — Z9181 History of falling: Secondary | ICD-10-CM

## 2018-01-12 DIAGNOSIS — M25511 Pain in right shoulder: Secondary | ICD-10-CM | POA: Diagnosis not present

## 2018-01-12 DIAGNOSIS — M545 Low back pain, unspecified: Secondary | ICD-10-CM

## 2018-01-12 DIAGNOSIS — M6281 Muscle weakness (generalized): Secondary | ICD-10-CM

## 2018-01-12 DIAGNOSIS — R2681 Unsteadiness on feet: Secondary | ICD-10-CM | POA: Diagnosis not present

## 2018-01-12 NOTE — Therapy (Signed)
[facility] [address] [place], [address] Phone: [phone]   Fax:  [phone]  Physical Therapy Treatment  Patient Details  Name: [name] MRN: [id number] Date of Birth: Nov 18, 1940 Referring Provider: Caryl Bis MD   Encounter Date: 01/12/2018  PT End of Session - 01/12/18 1300    Visit Number  3    Number of Visits  13    Date for PT Re-Evaluation  02/17/18    Authorization Type  3/10 progress note    PT Start Time  1000    PT Stop Time  1045    PT Time Calculation (min)  45 min    Equipment Utilized During Treatment  Gait belt    Activity Tolerance  Patient tolerated treatment well    Behavior During Therapy  [facility] for tasks assessed/performed       Past Medical History:  Diagnosis Date  . (HFpEF) heart failure with preserved ejection fraction ([place])    a. 08/2017 Echo: EF 55-60%, no rwma, mild MR, mildly dil LA, nl RV fxn.  . Breast cancer ([place]) 2001   left breast  . Cancer ([place]) 2001   left breast ca  . Carotid arterial disease ([place])    a. 10/2004 s/p L CEA; b. 12/2015 Carotid U/S: RICA 1-39%; b. LICA patent CEA site.  Marland Kitchen GERD (gastroesophageal reflux disease)   . Hyperlipidemia   . Hypertension   . Osteoarthritis, multiple sites   . Osteopenia   . Persistent atrial fibrillation ([place])    a. Dx 08/2017; b. CHA2DS2VASc = 6-->Xarelto; c. 09/2017 Successful DCCV (second shock - 200J);   Marland Kitchen Personal history of radiation therapy 2001   left breast ca  . Psoriasis     Past Surgical History:  Procedure Laterality Date  . ABDOMINAL HYSTERECTOMY    . ANKLE FRACTURE SURGERY  4/08   left---hardware still in place  . 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 BREAST EXCISIONAL BIOPSY Left yrs ago   benign  . BREAST LUMPECTOMY Left 2001   f/u radiation  . CARDIOVERSION N/A 10/04/2017   Procedure: CARDIOVERSION;  Surgeon: Wellington Hampshire, MD;  Location: [facility] ORS;  Service: Cardiovascular;   Laterality: N/A;  . CARDIOVERSION N/A 12/16/2017   Procedure: CARDIOVERSION;  Surgeon: [name], MD;  Location: [facility] ORS;  Service: Cardiovascular;  Laterality: N/A;  . CAROTID ENDARTERECTOMY Left 10/23/2004  . FRACTURE SURGERY    . OOPHORECTOMY    . SHOULDER SURGERY  6/07   left  . TONSILLECTOMY AND ADENOIDECTOMY    . TOTAL HIP ARTHROPLASTY  2004   right    There were no vitals filed for this visit.  Subjective Assessment - 01/12/18 1007    Subjective  Pt reports that back is feeling "rough" today and that the pain has ligered longer than usual. Pt repots that she tried hemp oil on tuesday and that it provided some relief. Pt also reportst that she has not had time in the morning to complete HEP due to ger dog needing her attention first thing in the am.     Pertinent History  Patient previously seen for balance difficulties and shoulder pain and the right.  Patient reports no falls in the past six months. Patient reports pain has not improved for the past month and has been about the same since the onset of pain.     Limitations  Walking    Currently in Pain?  Yes    Pain Score  4     Pain Location  Back    Pain Orientation  Left;Right;Lower    Pain Descriptors / Indicators  Aching    Pain Type  Chronic pain;Acute pain    Pain Onset  More than a month ago    Pain Frequency  Intermittent         TREATMENT  Therapeutic Exercise Pelvic tilts in pain free range ~3 min (pt reports incr pressure with ant pelvic tilt) LTRs x20 SKTC  LE marches with core engaged x15 SKTC LE marches with red physio ball above head x20 Bridges in pain free ROM  10x3 Standing lumbar extension x20  Standing hip abduction 10x4 B Standing hip extension 20x2 B Pelvic tilts sitting in chair ~2 min Pelvic tilts on physio ball ~2 min  TRX squats with focus on full ext and ant tilt in squat 10x2    Patient demonstrates no increase in pain at the end of the session.       PT Education -  01/12/18 1012    Education provided  Yes    Education Details  Patient educated on proper form and technique for therapeutic exercises. Pt aso educated on importance of doing lumbar mobiity exercises first thing in the morning to help decrease stiffness.    Person(s) Educated  Patient    Methods  Explanation;Demonstration    Comprehension  Verbalized understanding          PT Long Term Goals - 01/06/18 1645      PT LONG TERM GOAL #1   Title  Patient will be independent with HEP to continue benefits of therapy after discharge.     Baseline  Dependent with form and technique for balancing exercises; moderate cueing for balance and strengthening    Time  4    Period  Weeks    Status  On-going      PT LONG TERM GOAL #2   Title  Patient will improve FGA to over 26/30 to indicate functional improvement in strength and balance limitations and decrease fall risk    Baseline  22/30; ; 09/01/2017: 24/30; 09/30/2017: 25/30; 11/03/2017: 25/30    Time  8    Period  Weeks    Status  On-going      PT LONG TERM GOAL #3   Title  Patient will improve 5xSTS to under 16 seconds without use of a pillow to indicate functional improvement of LE function and decreased fall risk    Baseline  19 sec with pillow under chair; 09/01/2017 12 seconds with use of a pillow; 09/30/2017: 10.9 sec with use of pillow; defered today secondary to increased soreness from walking the previous day    Time  8    Period  Weeks    Status  On-going      PT LONG TERM GOAL #4   Title  Patient will be able to balance for 10sec with SLS to improve static balance and ability to get dressed when in standing    Baseline  1 sec; 09/01/2017: 3 sec each leg; 09/30/17: 4.5; 11/03/2017: 5 sec    Time  8    Period  Weeks    Status  On-going      PT LONG TERM GOAL #5   Title  Patient will have a worst lumbar pain of a 3/10 over the past week to indicate functional improvement with performing standing activities and prolonged sitting.     Baseline  Worst pain: 8/10    Time  6    Period  Weeks    Status  New      Additional Long Term Goals   Additional Long Term Goals  Yes      PT LONG TERM GOAL #6   Title  Patient will be able to sit for >1 hour to improve abiltity to drive without increase in pain.    Baseline  Increased pain after driving for <83JAS    Time  6    Period  Weeks    Status  New            Plan - 01/12/18 1305    Clinical Impression Statement  Patient requires significant verbal and tactile cueing for pelivc tilt exercise and demonstrates decreased mobility in anterior pelvic tilt. Patient also presents with significant hip weakness demonstrated in her ability to perform BLE bridging exercise. Pt will continue to benefit from silled PT in order to return to prior level of function.    Rehab Potential  Good    Clinical Impairments Affecting Rehab Potential  Positive: motivation; Negative: , age    PT Frequency  2x / week    PT Duration  6 weeks    PT Treatment/Interventions  ADLs/Self Care Home Management;Aquatic Therapy;Biofeedback;DME Instruction;Gait training;Stair training;Functional mobility training;Therapeutic activities;Therapeutic exercise;Balance training;Neuromuscular re-education;Patient/family education;Passive range of motion;Energy conservation;Ultrasound;Moist Heat;Iontophoresis 4mg /ml Dexamethasone;Cryotherapy;Electrical Stimulation;Manual techniques;Dry needling    PT Next Visit Plan  palof press low resistance, review ant/post peliv tilts     PT Home Exercise Plan  See education section    Consulted and Agree with Plan of Care  Patient       Patient will benefit from skilled therapeutic intervention in order to improve the following deficits and impairments:  Pain, Increased fascial restricitons, Decreased coordination, Decreased mobility, Decreased activity tolerance, Decreased endurance, Decreased range of motion, Decreased strength, Hypomobility, Postural dysfunction, Abnormal  gait, Decreased balance, Difficulty walking  Visit Diagnosis: Muscle weakness (generalized)  Acute bilateral low back pain without sciatica  Unsteadiness on feet  History of falling     Problem List Patient Active Problem List   Diagnosis Date Noted  . Chronic bilateral low back pain without sciatica 12/31/2017  . Hypomagnesemia 09/29/2017  . Hypokalemia 09/29/2017  . Atrial fibrillation ([place]) 09/10/2017  . Trapezius strain, right, initial encounter 09/10/2017  . Depression, major, single episode, mild ([place]) 09/10/2017  . Chronic venous insufficiency 09/09/2017  . Lymphedema 09/09/2017  . Pain of toe of left foot 08/17/2017  . Grief 08/17/2017  . Epistaxis 07/31/2017  . Leg swelling 07/31/2017  . Dry eyes 07/06/2017  . Pain and swelling of lower extremity, right 03/29/2017  . Right shoulder pain 03/29/2017  . Diarrhea 05/18/2016  . Fall 05/01/2016  . Unintentional weight loss 12/30/2015  . Leg weakness, bilateral 12/30/2015  . Hyperkalemia 01/01/2015  . Occasional numbness/prickling/tingling of fingers and toes 12/18/2014  . Routine general medical examination at a health care facility 12/13/2013  . Macrocytic anemia 05/19/2013  . Psoriatic arthritis ([place]) 02/03/2013  . Psoriasis   . Osteoarthritis, multiple sites   . Episodic mood disorder ([place]) 12/01/2011  . History of breast cancer 10/28/2011  . GERD (gastroesophageal reflux disease)   . Hyperlipidemia   . Hypertension   . Osteopenia   . Carotid stenosis 05/27/2011    Georg Ruddle, SPT 01/12/2018, 1:08 PM  [facility] [address] [place], [address] Phone: [phone]  Fax:  [phone]  Name: [name] MRN: [id number] Date of Birth: 1941/03/19

## 2018-01-14 ENCOUNTER — Telehealth: Payer: Self-pay | Admitting: Internal Medicine

## 2018-01-14 NOTE — Telephone Encounter (Signed)
Patient calling to make Heather aware States that her heart rate this morning was 57 therefore she is not going to take away the medication

## 2018-01-14 NOTE — Telephone Encounter (Signed)
I left a message on the patient's voice mail that her message was received and I will forward to Dr. Caryl Comes as an [name].  The patient was going to cut metoprolol in half if her HR was < 55 bpm today.   To Dr. Caryl Comes to review.

## 2018-01-17 ENCOUNTER — Ambulatory Visit: Payer: Medicare Other

## 2018-01-18 MED ORDER — METOPROLOL TARTRATE 50 MG PO TABS: ORAL_TABLET | ORAL | Status: DC

## 2018-01-18 NOTE — Telephone Encounter (Signed)
Reviewed the patient's message with Dr. Caryl Comes. Orders received that the patient can stop metoprolol tartrate and just take 50 mg as needed for fast heart rates/ a-fib.  I have called and spoken with the patient. I have advised her of Dr. Olin Pia recommendations. She is aware she may decrease metoprolol tartrate to 50 mg - 1/2 tablet (25 mg) BID to start with and see how her heart rates tolerate this. If still slow, she will discontinue metoprolol altogether and take it PRN. The patient voices understanding and is agreeable.

## 2018-01-18 NOTE — Telephone Encounter (Signed)
Pt calling today to ask if she is to take the medication or not Today her HR was about 53-51  She is not sure what to do   Please advise

## 2018-01-19 ENCOUNTER — Ambulatory Visit: Payer: Medicare Other | Attending: Family Medicine

## 2018-01-19 DIAGNOSIS — M545 Low back pain, unspecified: Secondary | ICD-10-CM

## 2018-01-19 DIAGNOSIS — Z9181 History of falling: Secondary | ICD-10-CM

## 2018-01-19 DIAGNOSIS — R2681 Unsteadiness on feet: Secondary | ICD-10-CM | POA: Diagnosis not present

## 2018-01-19 DIAGNOSIS — M6281 Muscle weakness (generalized): Secondary | ICD-10-CM

## 2018-01-19 NOTE — Therapy (Signed)
[facility] [address] [place], [address] Phone: [phone]   Fax:  [phone]  Physical Therapy Treatment  Patient Details  Name: [name] MRN: [id number] Date of Birth: 17-Mar-1941 Referring Provider: Caryl Bis MD   Encounter Date: 01/19/2018  PT End of Session - 01/19/18 1645    Visit Number  4    Number of Visits  13    Date for PT Re-Evaluation  02/17/18    Authorization Type  4/10 progress note    PT Start Time  1645    PT Stop Time  1730    PT Time Calculation (min)  45 min    Equipment Utilized During Treatment  Gait belt    Activity Tolerance  Patient tolerated treatment well    Behavior During Therapy  [facility] for tasks assessed/performed       Past Medical History:  Diagnosis Date  . (HFpEF) heart failure with preserved ejection fraction ([place])    a. 08/2017 Echo: EF 55-60%, no rwma, mild MR, mildly dil LA, nl RV fxn.  . Breast cancer ([place]) 2001   left breast  . Cancer ([place]) 2001   left breast ca  . Carotid arterial disease ([place])    a. 10/2004 s/p L CEA; b. 12/2015 Carotid U/S: RICA 1-39%; b. LICA patent CEA site.  Marland Kitchen GERD (gastroesophageal reflux disease)   . Hyperlipidemia   . Hypertension   . Osteoarthritis, multiple sites   . Osteopenia   . Persistent atrial fibrillation ([place])    a. Dx 08/2017; b. CHA2DS2VASc = 6-->Xarelto; c. 09/2017 Successful DCCV (second shock - 200J);   Marland Kitchen Personal history of radiation therapy 2001   left breast ca  . Psoriasis     Past Surgical History:  Procedure Laterality Date  . ABDOMINAL HYSTERECTOMY    . ANKLE FRACTURE SURGERY  4/08   left---hardware still in place  . 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 BREAST EXCISIONAL BIOPSY Left yrs ago   benign  . BREAST LUMPECTOMY Left 2001   f/u radiation  . CARDIOVERSION N/A 10/04/2017   Procedure: CARDIOVERSION;  Surgeon: Wellington Hampshire, MD;  Location: [facility] ORS;  Service: Cardiovascular;   Laterality: N/A;  . CARDIOVERSION N/A 12/16/2017   Procedure: CARDIOVERSION;  Surgeon: [name], MD;  Location: [facility] ORS;  Service: Cardiovascular;  Laterality: N/A;  . CAROTID ENDARTERECTOMY Left 10/23/2004  . FRACTURE SURGERY    . OOPHORECTOMY    . SHOULDER SURGERY  6/07   left  . TONSILLECTOMY AND ADENOIDECTOMY    . TOTAL HIP ARTHROPLASTY  2004   right    There were no vitals filed for this visit.  Subjective Assessment - 01/19/18 1655    Subjective  Pt reports that back feels "rotten" and that it feels about the same as it has been feeling. Pt states that she was cleaning a closet today and put on hemp oil and it helped some but it is still hurting in same area across lower back. Pt also reports that when she did complete exercises in the morning prior to getting out of bed "seemed to really help".     Pertinent History  Patient previously seen for balance difficulties and shoulder pain and the right.  Patient reports no falls in the past six months. Patient reports pain has not improved for the past month and has been about the same since the onset of pain.  Limitations  Walking    Patient Stated Goals  To improve balance when walking     Currently in Pain?  Yes    Pain Score  7     Pain Location  Back    Pain Orientation  Left;Lower;Right    Pain Descriptors / Indicators  Aching    Pain Type  Chronic pain;Acute pain    Pain Frequency  Intermittent       TREATMENT Therapeutic Exercise Pelvic tilts in pain free range ~3 min (pt reports incr pressure with ant pelvic tilt) LTRs x20 SKTC  LE marches with core engaged 20x2 [place] with red physio ball above head 10 x2 Bridges in pain free ROM  10x3 Hooklying hip abduction isometric hold with belt 5s x10 Hooklying hip adduction squeezing med ball 7s x10 Seated pelvic tilts 10x2 Sit to stands with arms extended with 1 lb weight 10x2  Patient demonstrates no increase in pain at the end of the  session      PT Education - 01/19/18 1645    Education provided  Yes    Education Details  Patient educated on proper form and technique for therapeutic exercises.     Person(s) Educated  Patient    Methods  Explanation;Demonstration    Comprehension  Verbalized understanding          PT Long Term Goals - 01/06/18 1645      PT LONG TERM GOAL #1   Title  Patient will be independent with HEP to continue benefits of therapy after discharge.     Baseline  Dependent with form and technique for balancing exercises; moderate cueing for balance and strengthening    Time  4    Period  Weeks    Status  On-going      PT LONG TERM GOAL #2   Title  Patient will improve FGA to over 26/30 to indicate functional improvement in strength and balance limitations and decrease fall risk    Baseline  22/30; ; 09/01/2017: 24/30; 09/30/2017: 25/30; 11/03/2017: 25/30    Time  8    Period  Weeks    Status  On-going      PT LONG TERM GOAL #3   Title  Patient will improve 5xSTS to under 16 seconds without use of a pillow to indicate functional improvement of LE function and decreased fall risk    Baseline  19 sec with pillow under chair; 09/01/2017 12 seconds with use of a pillow; 09/30/2017: 10.9 sec with use of pillow; defered today secondary to increased soreness from walking the previous day    Time  8    Period  Weeks    Status  On-going      PT LONG TERM GOAL #4   Title  Patient will be able to balance for 10sec with SLS to improve static balance and ability to get dressed when in standing    Baseline  1 sec; 09/01/2017: 3 sec each leg; 09/30/17: 4.5; 11/03/2017: 5 sec    Time  8    Period  Weeks    Status  On-going      PT LONG TERM GOAL #5   Title  Patient will have a worst lumbar pain of a 3/10 over the past week to indicate functional improvement with performing standing activities and prolonged sitting.    Baseline  Worst pain: 8/10    Time  6    Period  Weeks    Status  New  Additional Long Term Goals   Additional Long Term Goals  Yes      PT LONG TERM GOAL #6   Title  Patient will be able to sit for >1 hour to improve abiltity to drive without increase in pain.    Baseline  Increased pain after driving for <40JWJ    Time  6    Period  Weeks    Status  New            Plan - 01/19/18 1759    Clinical Impression Statement  Patient continues to demonstrate decreased mobility with anterior pelvic tilt. Patient reprots decreased pain in back after LTRs and bridges. Patient also continues to demonstrate significant hip weakness indicated by inability to perform BLE hip bridging exercise in full range. Pt will continue to benefit from skilled PT in order to return to prior level of function.    Rehab Potential  Good    Clinical Impairments Affecting Rehab Potential  Positive: motivation; Negative: , age    PT Frequency  2x / week    PT Duration  6 weeks    PT Treatment/Interventions  ADLs/Self Care Home Management;Aquatic Therapy;Biofeedback;DME Instruction;Gait training;Stair training;Functional mobility training;Therapeutic activities;Therapeutic exercise;Balance training;Neuromuscular re-education;Patient/family education;Passive range of motion;Energy conservation;Ultrasound;Moist Heat;Iontophoresis 4mg /ml Dexamethasone;Cryotherapy;Electrical Stimulation;Manual techniques;Dry needling    PT Next Visit Plan  palof press low resistance    PT Home Exercise Plan  See education section    Consulted and Agree with Plan of Care  Patient       Patient will benefit from skilled therapeutic intervention in order to improve the following deficits and impairments:  Pain, Increased fascial restricitons, Decreased coordination, Decreased mobility, Decreased activity tolerance, Decreased endurance, Decreased range of motion, Decreased strength, Hypomobility, Postural dysfunction, Abnormal gait, Decreased balance, Difficulty walking  Visit Diagnosis: Muscle weakness  (generalized)  Acute bilateral low back pain without sciatica  Unsteadiness on feet  History of falling     Problem List Patient Active Problem List   Diagnosis Date Noted  . Chronic bilateral low back pain without sciatica 12/31/2017  . Hypomagnesemia 09/29/2017  . Hypokalemia 09/29/2017  . Atrial fibrillation ([place]) 09/10/2017  . Trapezius strain, right, initial encounter 09/10/2017  . Depression, major, single episode, mild ([place]) 09/10/2017  . Chronic venous insufficiency 09/09/2017  . Lymphedema 09/09/2017  . Pain of toe of left foot 08/17/2017  . Grief 08/17/2017  . Epistaxis 07/31/2017  . Leg swelling 07/31/2017  . Dry eyes 07/06/2017  . Pain and swelling of lower extremity, right 03/29/2017  . Right shoulder pain 03/29/2017  . Diarrhea 05/18/2016  . Fall 05/01/2016  . Unintentional weight loss 12/30/2015  . Leg weakness, bilateral 12/30/2015  . Hyperkalemia 01/01/2015  . Occasional numbness/prickling/tingling of fingers and toes 12/18/2014  . Routine general medical examination at a health care facility 12/13/2013  . Macrocytic anemia 05/19/2013  . Psoriatic arthritis ([place]) 02/03/2013  . Psoriasis   . Osteoarthritis, multiple sites   . Episodic mood disorder ([place]) 12/01/2011  . History of breast cancer 10/28/2011  . GERD (gastroesophageal reflux disease)   . Hyperlipidemia   . Hypertension   . Osteopenia   . Carotid stenosis 05/27/2011    Georg Ruddle, SPT 01/19/2018, 6:01 PM  [facility] [address], [place], [address] Phone: [phone]   Fax:  [phone]  Name: [name] MRN: [id number] Date of Birth: [birth year]

## 2018-01-25 ENCOUNTER — Ambulatory Visit: Payer: Medicare Other

## 2018-01-26 ENCOUNTER — Ambulatory Visit: Payer: Medicare Other

## 2018-01-26 DIAGNOSIS — M545 Low back pain, unspecified: Secondary | ICD-10-CM

## 2018-01-26 DIAGNOSIS — M6281 Muscle weakness (generalized): Secondary | ICD-10-CM

## 2018-01-26 DIAGNOSIS — R2681 Unsteadiness on feet: Secondary | ICD-10-CM | POA: Diagnosis not present

## 2018-01-26 DIAGNOSIS — Z9181 History of falling: Secondary | ICD-10-CM | POA: Diagnosis not present

## 2018-01-26 NOTE — Therapy (Signed)
[facility] [address] [place], [address] Phone: [phone]   Fax:  [phone]  Physical Therapy Treatment  Patient Details  Name: [name] MRN: [id number] Date of Birth: [birth year] Referring Provider: Caryl Bis MD   Encounter Date: 01/26/2018  PT End of Session - 01/26/18 0959    Visit Number  5    Number of Visits  13    Date for PT Re-Evaluation  02/17/18    Authorization Type  5/10 progress note    PT Start Time  0900    PT Stop Time  0945    PT Time Calculation (min)  45 min    Equipment Utilized During Treatment  Gait belt    Activity Tolerance  Patient tolerated treatment well    Behavior During Therapy  [facility] for tasks assessed/performed       Past Medical History:  Diagnosis Date  . (HFpEF) heart failure with preserved ejection fraction ([place])    a. 08/2017 Echo: EF 55-60%, no rwma, mild MR, mildly dil LA, nl RV fxn.  . Breast cancer ([place]) 2001   left breast  . Cancer ([place]) 2001   left breast ca  . Carotid arterial disease ([place])    a. 10/2004 s/p L CEA; b. 12/2015 Carotid U/S: RICA 1-39%; b. LICA patent CEA site.  Marland Kitchen GERD (gastroesophageal reflux disease)   . Hyperlipidemia   . Hypertension   . Osteoarthritis, multiple sites   . Osteopenia   . Persistent atrial fibrillation ([place])    a. Dx 08/2017; b. CHA2DS2VASc = 6-->Xarelto; c. 09/2017 Successful DCCV (second shock - 200J);   Marland Kitchen Personal history of radiation therapy 2001   left breast ca  . Psoriasis     Past Surgical History:  Procedure Laterality Date  . ABDOMINAL HYSTERECTOMY    . ANKLE FRACTURE SURGERY  4/08   left---hardware still in place  . 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 BREAST EXCISIONAL BIOPSY Left yrs ago   benign  . BREAST LUMPECTOMY Left 2001   f/u radiation  . CARDIOVERSION N/A 10/04/2017   Procedure: CARDIOVERSION;  Surgeon: Wellington Hampshire, MD;  Location: [facility] ORS;  Service: Cardiovascular;   Laterality: N/A;  . CARDIOVERSION N/A 12/16/2017   Procedure: CARDIOVERSION;  Surgeon: [name], MD;  Location: [facility] ORS;  Service: Cardiovascular;  Laterality: N/A;  . CAROTID ENDARTERECTOMY Left 10/23/2004  . FRACTURE SURGERY    . OOPHORECTOMY    . SHOULDER SURGERY  6/07   left  . TONSILLECTOMY AND ADENOIDECTOMY    . TOTAL HIP ARTHROPLASTY  2004   right    There were no vitals filed for this visit.  Subjective Assessment - 01/26/18 0956    Subjective  Pt reports that back is feeling "rough" and similar to how it has been feeling in the past. However, pt also states that the pain improves faster when she does her HEP and that she notices a significant difference on days that she does complete HEP.    Pertinent History  Patient previously seen for balance difficulties and shoulder pain and the right.  Patient reports no falls in the past six months. Patient reports pain has not improved for the past month and has been about the same since the onset of pain.     Limitations  Walking    Patient Stated Goals  To improve balance when walking     Currently in Pain?  Yes  Pain Score  1     Pain Location  Back    Pain Orientation  Lower;Left;Right    Pain Descriptors / Indicators  Aching    Pain Type  Chronic pain;Acute pain    Pain Onset  More than a month ago    Pain Frequency  Intermittent           TREATMENT Therapeutic Exercise Pelvic tilts in pain free range ~3 min  LTRs x20 SKTC  LE marches with core engaged 20x2 [place] with red physio ball above head 10 x2 Bridges in pain free ROM  10x3 Hooklying hip abduction isometric hold with belt 3s x20 Hooklying hip adduction squeezing med ball 3s x20 Seated pelvic tilts 15x2 Sit to stands with arms extended with 2 lb weight 10x2 Palof press 5# x10 B (no increase in back pain with exercise)  Patient demonstrates no increase in pain at the end of the session      PT Education - 01/26/18 0959    Education  provided  Yes    Education Details  Pt educated on proper form and technique for therapeutic exercises.    Person(s) Educated  Patient    Methods  Explanation;Demonstration    Comprehension  Verbalized understanding          PT Long Term Goals - 01/06/18 1645      PT LONG TERM GOAL #1   Title  Patient will be independent with HEP to continue benefits of therapy after discharge.     Baseline  Dependent with form and technique for balancing exercises; moderate cueing for balance and strengthening    Time  4    Period  Weeks    Status  On-going      PT LONG TERM GOAL #2   Title  Patient will improve FGA to over 26/30 to indicate functional improvement in strength and balance limitations and decrease fall risk    Baseline  22/30; ; 09/01/2017: 24/30; 09/30/2017: 25/30; 11/03/2017: 25/30    Time  8    Period  Weeks    Status  On-going      PT LONG TERM GOAL #3   Title  Patient will improve 5xSTS to under 16 seconds without use of a pillow to indicate functional improvement of LE function and decreased fall risk    Baseline  19 sec with pillow under chair; 09/01/2017 12 seconds with use of a pillow; 09/30/2017: 10.9 sec with use of pillow; defered today secondary to increased soreness from walking the previous day    Time  8    Period  Weeks    Status  On-going      PT LONG TERM GOAL #4   Title  Patient will be able to balance for 10sec with SLS to improve static balance and ability to get dressed when in standing    Baseline  1 sec; 09/01/2017: 3 sec each leg; 09/30/17: 4.5; 11/03/2017: 5 sec    Time  8    Period  Weeks    Status  On-going      PT LONG TERM GOAL #5   Title  Patient will have a worst lumbar pain of a 3/10 over the past week to indicate functional improvement with performing standing activities and prolonged sitting.    Baseline  Worst pain: 8/10    Time  6    Period  Weeks    Status  New      Additional Long Term Goals  Additional Long Term Goals  Yes      PT  LONG TERM GOAL #6   Title  Patient will be able to sit for >1 hour to improve abiltity to drive without increase in pain.    Baseline  Increased pain after driving for <71HAF    Time  6    Period  Weeks    Status  New              Patient will benefit from skilled therapeutic intervention in order to improve the following deficits and impairments:     Visit Diagnosis: Muscle weakness (generalized)  Acute bilateral low back pain without sciatica     Problem List Patient Active Problem List   Diagnosis Date Noted  . Chronic bilateral low back pain without sciatica 12/31/2017  . Hypomagnesemia 09/29/2017  . Hypokalemia 09/29/2017  . Atrial fibrillation ([place]) 09/10/2017  . Trapezius strain, right, initial encounter 09/10/2017  . Depression, major, single episode, mild ([place]) 09/10/2017  . Chronic venous insufficiency 09/09/2017  . Lymphedema 09/09/2017  . Pain of toe of left foot 08/17/2017  . Grief 08/17/2017  . Epistaxis 07/31/2017  . Leg swelling 07/31/2017  . Dry eyes 07/06/2017  . Pain and swelling of lower extremity, right 03/29/2017  . Right shoulder pain 03/29/2017  . Diarrhea 05/18/2016  . Fall 05/01/2016  . Unintentional weight loss 12/30/2015  . Leg weakness, bilateral 12/30/2015  . Hyperkalemia 01/01/2015  . Occasional numbness/prickling/tingling of fingers and toes 12/18/2014  . Routine general medical examination at a health care facility 12/13/2013  . Macrocytic anemia 05/19/2013  . Psoriatic arthritis ([place]) 02/03/2013  . Psoriasis   . Osteoarthritis, multiple sites   . Episodic mood disorder ([place]) 12/01/2011  . History of breast cancer 10/28/2011  . GERD (gastroesophageal reflux disease)   . Hyperlipidemia   . Hypertension   . Osteopenia   . Carotid stenosis 05/27/2011    Georg Ruddle, SPT 01/26/2018, 10:01 AM  [facility] [address] [place], [address] Phone:  [phone]   Fax:  [phone]  Name: [name] MRN: [id number] Date of Birth: [birth year]

## 2018-01-31 ENCOUNTER — Ambulatory Visit: Payer: Medicare Other

## 2018-01-31 VITALS — BP 138/62 | HR 56

## 2018-01-31 DIAGNOSIS — M545 Low back pain, unspecified: Secondary | ICD-10-CM

## 2018-01-31 DIAGNOSIS — R2681 Unsteadiness on feet: Secondary | ICD-10-CM

## 2018-01-31 DIAGNOSIS — Z9181 History of falling: Secondary | ICD-10-CM | POA: Diagnosis not present

## 2018-01-31 DIAGNOSIS — M6281 Muscle weakness (generalized): Secondary | ICD-10-CM

## 2018-01-31 NOTE — Therapy (Signed)
[facility] [address], [place], [address] Phone: [phone]   Fax:  [phone]  Physical Therapy Treatment  Patient Details  Name: [name] MRN: [id number] Date of Birth: [birth year] Referring Provider: Caryl Bis MD   Encounter Date: 01/31/2018  PT End of Session - 01/31/18 1236    Visit Number  6    Number of Visits  13    Date for PT Re-Evaluation  02/17/18    Authorization Type  6/10    PT Start Time  0915    PT Stop Time  1000    PT Time Calculation (min)  45 min    Activity Tolerance  Patient tolerated treatment well    Behavior During Therapy  [facility] for tasks assessed/performed       Past Medical History:  Diagnosis Date  . (HFpEF) heart failure with preserved ejection fraction ([place])    a. 08/2017 Echo: EF 55-60%, no rwma, mild MR, mildly dil LA, nl RV fxn.  . Breast cancer ([place]) 2001   left breast  . Cancer ([place]) 2001   left breast ca  . Carotid arterial disease ([place])    a. 10/2004 s/p L CEA; b. 12/2015 Carotid U/S: RICA 1-39%; b. LICA patent CEA site.  Marland Kitchen GERD (gastroesophageal reflux disease)   . Hyperlipidemia   . Hypertension   . Osteoarthritis, multiple sites   . Osteopenia   . Persistent atrial fibrillation ([place])    a. Dx 08/2017; b. CHA2DS2VASc = 6-->Xarelto; c. 09/2017 Successful DCCV (second shock - 200J);   Marland Kitchen Personal history of radiation therapy 2001   left breast ca  . Psoriasis     Past Surgical History:  Procedure Laterality Date  . ABDOMINAL HYSTERECTOMY    . ANKLE FRACTURE SURGERY  4/08   left---hardware still in place  . 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 BREAST EXCISIONAL BIOPSY Left yrs ago   benign  . BREAST LUMPECTOMY Left 2001   f/u radiation  . CARDIOVERSION N/A 10/04/2017   Procedure: CARDIOVERSION;  Surgeon: Wellington Hampshire, MD;  Location: [facility] ORS;  Service: Cardiovascular;  Laterality: N/A;  . CARDIOVERSION N/A 12/16/2017   Procedure:  CARDIOVERSION;  Surgeon: [name], MD;  Location: [facility] ORS;  Service: Cardiovascular;  Laterality: N/A;  . CAROTID ENDARTERECTOMY Left 10/23/2004  . FRACTURE SURGERY    . OOPHORECTOMY    . SHOULDER SURGERY  6/07   left  . TONSILLECTOMY AND ADENOIDECTOMY    . TOTAL HIP ARTHROPLASTY  2004   right    There were no vitals filed for this visit.  Subjective Assessment - 01/31/18 1233    Subjective  Pt reports that her back is feeling very good today and that she is currently not in any pain. Pt also reports that she went on a walk in neighborhood with her dog and did not experience any exacerbation of symptoms.     Pertinent History  Patient previously seen for balance difficulties and shoulder pain and the right.  Patient reports no falls in the past six months. Patient reports pain has not improved for the past month and has been about the same since the onset of pain.     Limitations  Walking    Patient Stated Goals  To improve balance when walking     Currently in Pain?  No/denies        TREATMENT Therapeutic Exercises LTRs x30 Hooklying bridges BLE 10x2 (  improved range and decr pain today) Hips/knees 90/90 with overhead reaches with red physio ball focused on core engagement 10x2 Hooklying hip abduction isometric hold with belt 3s x20 Hooklying hip adduction squeezing med ball 3s x20 Palof press 5# 10x2 B (decr verbal cueing required for proper form) Sit to stands from green chair with airex and YTB around knees to decr knee valgus 10x2 Standing hip extension 10x2 (added to HEP) Airex balance beam lateral stepping x6, tandem amb x6  Patient reports no increase in pain throughout treatment session.       PT Education - 01/31/18 1235    Education provided  Yes    Education Details  Patient educated on proper form and technique for therapeutic exercises.    Methods  Explanation;Demonstration    Comprehension  Verbalized understanding          PT Long Term Goals -  01/06/18 1645      PT LONG TERM GOAL #1   Title  Patient will be independent with HEP to continue benefits of therapy after discharge.     Baseline  Dependent with form and technique for balancing exercises; moderate cueing for balance and strengthening    Time  4    Period  Weeks    Status  On-going      PT LONG TERM GOAL #2   Title  Patient will improve FGA to over 26/30 to indicate functional improvement in strength and balance limitations and decrease fall risk    Baseline  22/30; ; 09/01/2017: 24/30; 09/30/2017: 25/30; 11/03/2017: 25/30    Time  8    Period  Weeks    Status  On-going      PT LONG TERM GOAL #3   Title  Patient will improve 5xSTS to under 16 seconds without use of a pillow to indicate functional improvement of LE function and decreased fall risk    Baseline  19 sec with pillow under chair; 09/01/2017 12 seconds with use of a pillow; 09/30/2017: 10.9 sec with use of pillow; defered today secondary to increased soreness from walking the previous day    Time  8    Period  Weeks    Status  On-going      PT LONG TERM GOAL #4   Title  Patient will be able to balance for 10sec with SLS to improve static balance and ability to get dressed when in standing    Baseline  1 sec; 09/01/2017: 3 sec each leg; 09/30/17: 4.5; 11/03/2017: 5 sec    Time  8    Period  Weeks    Status  On-going      PT LONG TERM GOAL #5   Title  Patient will have a worst lumbar pain of a 3/10 over the past week to indicate functional improvement with performing standing activities and prolonged sitting.    Baseline  Worst pain: 8/10    Time  6    Period  Weeks    Status  New      Additional Long Term Goals   Additional Long Term Goals  Yes      PT LONG TERM GOAL #6   Title  Patient will be able to sit for >1 hour to improve abiltity to drive without increase in pain.    Baseline  Increased pain after driving for <92EQA    Time  6    Period  Weeks    Status  New  Plan - 01/31/18  1237    Clinical Impression Statement  Patient demonstrates significant improvement in tolerance to therapeutic exercises today. Pt is able to perform bridging exercise at an increased range and without any pain. Pt demonstrates significant knee valgus during sit to stands and requires verbal and tactile cueing for proper form. Pt will continue to benefit from skilled PT in order to return to prior level of function.    Rehab Potential  Good    Clinical Impairments Affecting Rehab Potential  Positive: motivation; Negative: , age    PT Frequency  2x / week    PT Duration  6 weeks    PT Treatment/Interventions  ADLs/Self Care Home Management;Aquatic Therapy;Biofeedback;DME Instruction;Gait training;Stair training;Functional mobility training;Therapeutic activities;Therapeutic exercise;Balance training;Neuromuscular re-education;Patient/family education;Passive range of motion;Energy conservation;Ultrasound;Moist Heat;Iontophoresis 4mg /ml Dexamethasone;Cryotherapy;Electrical Stimulation;Manual techniques;Dry needling    PT Next Visit Plan  palof press, standing hip ext with TB, hip abd     PT Home Exercise Plan  see education section    Consulted and Agree with Plan of Care  Patient       Patient will benefit from skilled therapeutic intervention in order to improve the following deficits and impairments:  Pain, Increased fascial restricitons, Decreased coordination, Decreased mobility, Decreased activity tolerance, Decreased endurance, Decreased range of motion, Decreased strength, Hypomobility, Postural dysfunction, Abnormal gait, Decreased balance, Difficulty walking  Visit Diagnosis: Muscle weakness (generalized)  Acute bilateral low back pain without sciatica  Unsteadiness on feet     Problem List Patient Active Problem List   Diagnosis Date Noted  . Chronic bilateral low back pain without sciatica 12/31/2017  . Hypomagnesemia 09/29/2017  . Hypokalemia 09/29/2017  . Atrial  fibrillation ([place]) 09/10/2017  . Trapezius strain, right, initial encounter 09/10/2017  . Depression, major, single episode, mild ([place]) 09/10/2017  . Chronic venous insufficiency 09/09/2017  . Lymphedema 09/09/2017  . Pain of toe of left foot 08/17/2017  . Grief 08/17/2017  . Epistaxis 07/31/2017  . Leg swelling 07/31/2017  . Dry eyes 07/06/2017  . Pain and swelling of lower extremity, right 03/29/2017  . Right shoulder pain 03/29/2017  . Diarrhea 05/18/2016  . Fall 05/01/2016  . Unintentional weight loss 12/30/2015  . Leg weakness, bilateral 12/30/2015  . Hyperkalemia 01/01/2015  . Occasional numbness/prickling/tingling of fingers and toes 12/18/2014  . Routine general medical examination at a health care facility 12/13/2013  . Macrocytic anemia 05/19/2013  . Psoriatic arthritis ([place]) 02/03/2013  . Psoriasis   . Osteoarthritis, multiple sites   . Episodic mood disorder ([place]) 12/01/2011  . History of breast cancer 10/28/2011  . GERD (gastroesophageal reflux disease)   . Hyperlipidemia   . Hypertension   . Osteopenia   . Carotid stenosis 05/27/2011    Georg Ruddle, SPT 01/31/2018, 12:40 PM  [facility] [address] [place], [address] Phone: [phone]   Fax:  [phone]  Name: [name] MRN: [id number] Date of Birth: 09-22-1940

## 2018-02-02 ENCOUNTER — Ambulatory Visit: Payer: Medicare Other

## 2018-02-02 DIAGNOSIS — M545 Low back pain, unspecified: Secondary | ICD-10-CM

## 2018-02-02 DIAGNOSIS — M6281 Muscle weakness (generalized): Secondary | ICD-10-CM

## 2018-02-02 DIAGNOSIS — Z9181 History of falling: Secondary | ICD-10-CM | POA: Diagnosis not present

## 2018-02-02 DIAGNOSIS — R2681 Unsteadiness on feet: Secondary | ICD-10-CM | POA: Diagnosis not present

## 2018-02-02 NOTE — Therapy (Signed)
[facility] [address] [place], [address] Phone: [phone]   Fax:  [phone]  Physical Therapy Treatment  Patient Details  Name: [name] MRN: [id number] Date of Birth: Feb 16, 1941 Referring Provider: Caryl Bis MD   Encounter Date: 02/02/2018  PT End of Session - 02/02/18 0920    Visit Number  7    Number of Visits  13    Date for PT Re-Evaluation  02/17/18    Authorization Type  7/10    PT Start Time  0830    PT Stop Time  0915    PT Time Calculation (min)  45 min    Activity Tolerance  Patient tolerated treatment well    Behavior During Therapy  [facility] for tasks assessed/performed       Past Medical History:  Diagnosis Date  . (HFpEF) heart failure with preserved ejection fraction ([place])    a. 08/2017 Echo: EF 55-60%, no rwma, mild MR, mildly dil LA, nl RV fxn.  . Breast cancer ([place]) 2001   left breast  . Cancer ([place]) 2001   left breast ca  . Carotid arterial disease ([place])    a. 10/2004 s/p L CEA; b. 12/2015 Carotid U/S: RICA 1-39%; b. LICA patent CEA site.  Marland Kitchen GERD (gastroesophageal reflux disease)   . Hyperlipidemia   . Hypertension   . Osteoarthritis, multiple sites   . Osteopenia   . Persistent atrial fibrillation ([place])    a. Dx 08/2017; b. CHA2DS2VASc = 6-->Xarelto; c. 09/2017 Successful DCCV (second shock - 200J);   Marland Kitchen Personal history of radiation therapy 2001   left breast ca  . Psoriasis     Past Surgical History:  Procedure Laterality Date  . ABDOMINAL HYSTERECTOMY    . ANKLE FRACTURE SURGERY  4/08   left---hardware still in place  . 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 BREAST EXCISIONAL BIOPSY Left yrs ago   benign  . BREAST LUMPECTOMY Left 2001   f/u radiation  . CARDIOVERSION N/A 10/04/2017   Procedure: CARDIOVERSION;  Surgeon: Wellington Hampshire, MD;  Location: [facility] ORS;  Service: Cardiovascular;  Laterality: N/A;  . CARDIOVERSION N/A 12/16/2017   Procedure:  CARDIOVERSION;  Surgeon: [name], MD;  Location: [facility] ORS;  Service: Cardiovascular;  Laterality: N/A;  . CAROTID ENDARTERECTOMY Left 10/23/2004  . FRACTURE SURGERY    . OOPHORECTOMY    . SHOULDER SURGERY  6/07   left  . TONSILLECTOMY AND ADENOIDECTOMY    . TOTAL HIP ARTHROPLASTY  2004   right    There were no vitals filed for this visit.  Subjective Assessment - 02/02/18 0918    Subjective  Pt reports that back is feeling "fine" and that she went on a walk this morning and did not have any pain. Pt also states that she feels she has increased her stamina and overall feels that she is improving.     Pertinent History  Patient previously seen for balance difficulties and shoulder pain and the right.  Patient reports no falls in the past six months. Patient reports pain has not improved for the past month and has been about the same since the onset of pain.     Limitations  Walking    Patient Stated Goals  To improve balance when walking     Currently in Pain?  No/denies        TREATMENT Therapeutic Exercises Pelvic tilts ant/post x20 LTRs x30 Hooklying  bridges BLE 10x2 Hips/knees 90/90 with overhead reaches with red physio ball focused on core engagement 10x2 Hooklying hip abduction isometric hold with belt 3s x20 Hooklying hip adduction squeezing med ball 3s x20 Sit to stands from green chair with airex and YTB around knees to decr knee valgus 10x2 Standing hip extension 10x2 Standing hip abduction with YTB x20 B Airex balance beam lateral stepping x6, tandem amb x6 (focus on decreasing UE support and not looking down at feet)  TRX squats with focus on full hip ext upon standing 15x2   Pt demonstrates fatigue at end of session. No increase in pain throughout therapeutic exercises.     PT Education - 02/02/18 0919    Education provided  Yes    Education Details  Patient educated on proper form and technique for therapeutic exercises.    Person(s) Educated  Patient     Methods  Explanation;Demonstration    Comprehension  Verbalized understanding          PT Long Term Goals - 01/06/18 1645      PT LONG TERM GOAL #1   Title  Patient will be independent with HEP to continue benefits of therapy after discharge.     Baseline  Dependent with form and technique for balancing exercises; moderate cueing for balance and strengthening    Time  4    Period  Weeks    Status  On-going      PT LONG TERM GOAL #2   Title  Patient will improve FGA to over 26/30 to indicate functional improvement in strength and balance limitations and decrease fall risk    Baseline  22/30; ; 09/01/2017: 24/30; 09/30/2017: 25/30; 11/03/2017: 25/30    Time  8    Period  Weeks    Status  On-going      PT LONG TERM GOAL #3   Title  Patient will improve 5xSTS to under 16 seconds without use of a pillow to indicate functional improvement of LE function and decreased fall risk    Baseline  19 sec with pillow under chair; 09/01/2017 12 seconds with use of a pillow; 09/30/2017: 10.9 sec with use of pillow; defered today secondary to increased soreness from walking the previous day    Time  8    Period  Weeks    Status  On-going      PT LONG TERM GOAL #4   Title  Patient will be able to balance for 10sec with SLS to improve static balance and ability to get dressed when in standing    Baseline  1 sec; 09/01/2017: 3 sec each leg; 09/30/17: 4.5; 11/03/2017: 5 sec    Time  8    Period  Weeks    Status  On-going      PT LONG TERM GOAL #5   Title  Patient will have a worst lumbar pain of a 3/10 over the past week to indicate functional improvement with performing standing activities and prolonged sitting.    Baseline  Worst pain: 8/10    Time  6    Period  Weeks    Status  New      Additional Long Term Goals   Additional Long Term Goals  Yes      PT LONG TERM GOAL #6   Title  Patient will be able to sit for >1 hour to improve abiltity to drive without increase in pain.    Baseline   Increased pain after driving for <09NAT  Time  6    Period  Weeks    Status  New            Plan - 02/02/18 1519    Clinical Impression Statement  Pt required decreased verbal cueing for proper form for therapeutic exercises today. Pt continues to demonstrate improvements in tolerance to therapeutic exercises and is able to complete more repititions. Pt will continue to benefit from skilled PT in order to return to prior level of function.    Rehab Potential  Good    Clinical Impairments Affecting Rehab Potential  Positive: motivation; Negative: , age    PT Frequency  2x / week    PT Duration  6 weeks    PT Treatment/Interventions  ADLs/Self Care Home Management;Aquatic Therapy;Biofeedback;DME Instruction;Gait training;Stair training;Functional mobility training;Therapeutic activities;Therapeutic exercise;Balance training;Neuromuscular re-education;Patient/family education;Passive range of motion;Energy conservation;Ultrasound;Moist Heat;Iontophoresis 4mg /ml Dexamethasone;Cryotherapy;Electrical Stimulation;Manual techniques;Dry needling    PT Next Visit Plan  add TB to hip ext, incr weight for palof press    PT Home Exercise Plan  see education section    Consulted and Agree with Plan of Care  Patient       Patient will benefit from skilled therapeutic intervention in order to improve the following deficits and impairments:  Pain, Increased fascial restricitons, Decreased coordination, Decreased mobility, Decreased activity tolerance, Decreased endurance, Decreased range of motion, Decreased strength, Hypomobility, Postural dysfunction, Abnormal gait, Decreased balance, Difficulty walking  Visit Diagnosis: Muscle weakness (generalized)  Acute bilateral low back pain without sciatica  History of falling     Problem List Patient Active Problem List   Diagnosis Date Noted  . Chronic bilateral low back pain without sciatica 12/31/2017  . Hypomagnesemia 09/29/2017  .  Hypokalemia 09/29/2017  . Atrial fibrillation ([place]) 09/10/2017  . Trapezius strain, right, initial encounter 09/10/2017  . Depression, major, single episode, mild ([place]) 09/10/2017  . Chronic venous insufficiency 09/09/2017  . Lymphedema 09/09/2017  . Pain of toe of left foot 08/17/2017  . Grief 08/17/2017  . Epistaxis 07/31/2017  . Leg swelling 07/31/2017  . Dry eyes 07/06/2017  . Pain and swelling of lower extremity, right 03/29/2017  . Right shoulder pain 03/29/2017  . Diarrhea 05/18/2016  . Fall 05/01/2016  . Unintentional weight loss 12/30/2015  . Leg weakness, bilateral 12/30/2015  . Hyperkalemia 01/01/2015  . Occasional numbness/prickling/tingling of fingers and toes 12/18/2014  . Routine general medical examination at a health care facility 12/13/2013  . Macrocytic anemia 05/19/2013  . Psoriatic arthritis ([place]) 02/03/2013  . Psoriasis   . Osteoarthritis, multiple sites   . Episodic mood disorder ([place]) 12/01/2011  . History of breast cancer 10/28/2011  . GERD (gastroesophageal reflux disease)   . Hyperlipidemia   . Hypertension   . Osteopenia   . Carotid stenosis 05/27/2011    Georg Ruddle, SPT 02/03/2018, 9:13 AM  [facility] [address] [place], [address] Phone: [phone]   Fax:  [phone]  Name: [name] MRN: [id number] Date of Birth: February 07, 1941

## 2018-02-05 ENCOUNTER — Other Ambulatory Visit: Payer: Self-pay | Admitting: Family Medicine

## 2018-02-07 ENCOUNTER — Ambulatory Visit: Payer: Medicare Other

## 2018-02-07 DIAGNOSIS — M6281 Muscle weakness (generalized): Secondary | ICD-10-CM | POA: Diagnosis not present

## 2018-02-07 DIAGNOSIS — M545 Low back pain, unspecified: Secondary | ICD-10-CM

## 2018-02-07 DIAGNOSIS — R2681 Unsteadiness on feet: Secondary | ICD-10-CM | POA: Diagnosis not present

## 2018-02-07 DIAGNOSIS — Z9181 History of falling: Secondary | ICD-10-CM | POA: Diagnosis not present

## 2018-02-07 NOTE — Therapy (Signed)
[facility] [address], [place], [address] Phone: [phone]   Fax:  [phone]  Physical Therapy Treatment  Patient Details  Name: [name] MRN: [id number] Date of Birth: 1940-10-27 Referring Provider: Caryl Bis MD   Encounter Date: 02/07/2018  PT End of Session - 02/07/18 1059    Visit Number  8    Number of Visits  13    Date for PT Re-Evaluation  02/17/18    Authorization Type  8/10    PT Start Time  0945    PT Stop Time  1030    PT Time Calculation (min)  45 min    Equipment Utilized During Treatment  Gait belt    Activity Tolerance  Patient tolerated treatment well    Behavior During Therapy  [facility] for tasks assessed/performed       Past Medical History:  Diagnosis Date  . (HFpEF) heart failure with preserved ejection fraction ([place])    a. 08/2017 Echo: EF 55-60%, no rwma, mild MR, mildly dil LA, nl RV fxn.  . Breast cancer ([place]) 2001   left breast  . Cancer ([place]) 2001   left breast ca  . Carotid arterial disease ([place])    a. 10/2004 s/p L CEA; b. 12/2015 Carotid U/S: RICA 1-39%; b. LICA patent CEA site.  Marland Kitchen GERD (gastroesophageal reflux disease)   . Hyperlipidemia   . Hypertension   . Osteoarthritis, multiple sites   . Osteopenia   . Persistent atrial fibrillation ([place])    a. Dx 08/2017; b. CHA2DS2VASc = 6-->Xarelto; c. 09/2017 Successful DCCV (second shock - 200J);   Marland Kitchen Personal history of radiation therapy 2001   left breast ca  . Psoriasis     Past Surgical History:  Procedure Laterality Date  . ABDOMINAL HYSTERECTOMY    . ANKLE FRACTURE SURGERY  4/08   left---hardware still in place  . 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 BREAST EXCISIONAL BIOPSY Left yrs ago   benign  . BREAST LUMPECTOMY Left 2001   f/u radiation  . CARDIOVERSION N/A 10/04/2017   Procedure: CARDIOVERSION;  Surgeon: Wellington Hampshire, MD;  Location: [facility] ORS;  Service: Cardiovascular;  Laterality: N/A;   . CARDIOVERSION N/A 12/16/2017   Procedure: CARDIOVERSION;  Surgeon: [name], MD;  Location: [facility] ORS;  Service: Cardiovascular;  Laterality: N/A;  . CAROTID ENDARTERECTOMY Left 10/23/2004  . FRACTURE SURGERY    . OOPHORECTOMY    . SHOULDER SURGERY  6/07   left  . TONSILLECTOMY AND ADENOIDECTOMY    . TOTAL HIP ARTHROPLASTY  2004   right    There were no vitals filed for this visit.  Subjective Assessment - 02/07/18 1055    Subjective  Pt report some LBP and stiffness upon arrival today.      Limitations  Walking    Patient Stated Goals  To improve balance when walking     Currently in Pain?  Yes    Pain Score  2     Pain Location  Back    Pain Orientation  Lower;Right;Left    Pain Descriptors / Indicators  Aching    Pain Type  Chronic pain;Acute pain    Pain Onset  More than a month ago    Pain Frequency  Intermittent    Multiple Pain Sites  No          Treatment:  There Ex:  Ant/Post Pelvic Tilts 20x; LTR's 10xB; Forsan  with towel under thigh and overpressure 4x30 sec B; Bridging with RTB around thighs 2x10; TRX squats from gray chair 2x10; Hip Abd YTB 2x10 B; Hip extension YTB 2x10 B; SLS and tandem stance on AirEx with finger tip and no finger hold. Alt toe tapping on 6" step.                     PT Education - 02/07/18 1059    Education provided  Yes    Education Details  Pt educated on proper form and technique for ther ex's.    Person(s) Educated  Patient    Methods  Explanation;Demonstration    Comprehension  Returned demonstration          PT Long Term Goals - 01/06/18 1645      PT LONG TERM GOAL #1   Title  Patient will be independent with HEP to continue benefits of therapy after discharge.     Baseline  Dependent with form and technique for balancing exercises; moderate cueing for balance and strengthening    Time  4    Period  Weeks    Status  On-going      PT LONG TERM GOAL #2   Title  Patient will improve FGA to over  26/30 to indicate functional improvement in strength and balance limitations and decrease fall risk    Baseline  22/30; ; 09/01/2017: 24/30; 09/30/2017: 25/30; 11/03/2017: 25/30    Time  8    Period  Weeks    Status  On-going      PT LONG TERM GOAL #3   Title  Patient will improve 5xSTS to under 16 seconds without use of a pillow to indicate functional improvement of LE function and decreased fall risk    Baseline  19 sec with pillow under chair; 09/01/2017 12 seconds with use of a pillow; 09/30/2017: 10.9 sec with use of pillow; defered today secondary to increased soreness from walking the previous day    Time  8    Period  Weeks    Status  On-going      PT LONG TERM GOAL #4   Title  Patient will be able to balance for 10sec with SLS to improve static balance and ability to get dressed when in standing    Baseline  1 sec; 09/01/2017: 3 sec each leg; 09/30/17: 4.5; 11/03/2017: 5 sec    Time  8    Period  Weeks    Status  On-going      PT LONG TERM GOAL #5   Title  Patient will have a worst lumbar pain of a 3/10 over the past week to indicate functional improvement with performing standing activities and prolonged sitting.    Baseline  Worst pain: 8/10    Time  6    Period  Weeks    Status  New      Additional Long Term Goals   Additional Long Term Goals  Yes      PT LONG TERM GOAL #6   Title  Patient will be able to sit for >1 hour to improve abiltity to drive without increase in pain.    Baseline  Increased pain after driving for <53IRW    Time  6    Period  Weeks    Status  New            Plan - 02/07/18 1100    Clinical Impression Statement  Pt repsonded well to [facility] stretches  today to decrease lumbar pain and soreness.  She had some difficulty lifting R LE with 6" step toe taps exercise.  Continue with balance and core/LE strengthening at next visit.    Clinical Presentation  Stable    Rehab Potential  Good    Clinical Impairments Affecting Rehab Potential  Positive:  motivation; Negative: , age    PT Frequency  2x / week    PT Duration  6 weeks    PT Treatment/Interventions  ADLs/Self Care Home Management;Aquatic Therapy;Biofeedback;DME Instruction;Gait training;Stair training;Functional mobility training;Therapeutic activities;Therapeutic exercise;Balance training;Neuromuscular re-education;Patient/family education;Passive range of motion;Energy conservation;Ultrasound;Moist Heat;Iontophoresis 4mg /ml Dexamethasone;Cryotherapy;Electrical Stimulation;Manual techniques;Dry needling    PT Next Visit Plan  progress hip/core stab ex's    PT Home Exercise Plan  see education section    Consulted and Agree with Plan of Care  Patient       Patient will benefit from skilled therapeutic intervention in order to improve the following deficits and impairments:  Pain, Increased fascial restricitons, Decreased coordination, Decreased mobility, Decreased activity tolerance, Decreased endurance, Decreased range of motion, Decreased strength, Hypomobility, Postural dysfunction, Abnormal gait, Decreased balance, Difficulty walking  Visit Diagnosis: Muscle weakness (generalized)  Acute bilateral low back pain without sciatica     Problem List Patient Active Problem List   Diagnosis Date Noted  . Chronic bilateral low back pain without sciatica 12/31/2017  . Hypomagnesemia 09/29/2017  . Hypokalemia 09/29/2017  . Atrial fibrillation ([place]) 09/10/2017  . Trapezius strain, right, initial encounter 09/10/2017  . Depression, major, single episode, mild ([place]) 09/10/2017  . Chronic venous insufficiency 09/09/2017  . Lymphedema 09/09/2017  . Pain of toe of left foot 08/17/2017  . Grief 08/17/2017  . Epistaxis 07/31/2017  . Leg swelling 07/31/2017  . Dry eyes 07/06/2017  . Pain and swelling of lower extremity, right 03/29/2017  . Right shoulder pain 03/29/2017  . Diarrhea 05/18/2016  . Fall 05/01/2016  . Unintentional weight loss 12/30/2015  . Leg weakness, bilateral  12/30/2015  . Hyperkalemia 01/01/2015  . Occasional numbness/prickling/tingling of fingers and toes 12/18/2014  . Routine general medical examination at a health care facility 12/13/2013  . Macrocytic anemia 05/19/2013  . Psoriatic arthritis ([place]) 02/03/2013  . Psoriasis   . Osteoarthritis, multiple sites   . Episodic mood disorder ([place]) 12/01/2011  . History of breast cancer 10/28/2011  . GERD (gastroesophageal reflux disease)   . Hyperlipidemia   . Hypertension   . Osteopenia   . Carotid stenosis 05/27/2011    Dio Giller, MPT 02/07/2018, 11:11 AM  [place] [facility] [address], [place], [address] Phone: [phone]   Fax:  [phone]  Name: [name] MRN: [id number] Date of Birth: 1940-12-04

## 2018-02-09 ENCOUNTER — Ambulatory Visit: Payer: Medicare Other

## 2018-02-09 DIAGNOSIS — Z9181 History of falling: Secondary | ICD-10-CM

## 2018-02-09 DIAGNOSIS — M545 Low back pain, unspecified: Secondary | ICD-10-CM

## 2018-02-09 DIAGNOSIS — R2681 Unsteadiness on feet: Secondary | ICD-10-CM | POA: Diagnosis not present

## 2018-02-09 DIAGNOSIS — M6281 Muscle weakness (generalized): Secondary | ICD-10-CM | POA: Diagnosis not present

## 2018-02-09 NOTE — Therapy (Signed)
[place] [facility] [address] [place], [address] Phone: [phone]   Fax:  [phone]  Physical Therapy Treatment  Patient Details  Name: [name] MRN: [id number] Date of Birth: [birth year] Referring Provider: Caryl Bis MD   Encounter Date: 02/09/2018  PT End of Session - 02/09/18 0957    Visit Number  9    Number of Visits  13    Date for PT Re-Evaluation  02/17/18    Authorization Type  9/10    PT Start Time  0945    PT Stop Time  1030    PT Time Calculation (min)  45 min    Equipment Utilized During Treatment  Gait belt    Activity Tolerance  Patient tolerated treatment well    Behavior During Therapy  [facility] for tasks assessed/performed       Past Medical History:  Diagnosis Date  . (HFpEF) heart failure with preserved ejection fraction ([place])    a. 08/2017 Echo: EF 55-60%, no rwma, mild MR, mildly dil LA, nl RV fxn.  . Breast cancer ([place]) 2001   left breast  . Cancer ([place]) 2001   left breast ca  . Carotid arterial disease ([place])    a. 10/2004 s/p L CEA; b. 12/2015 Carotid U/S: RICA 1-39%; b. LICA patent CEA site.  Marland Kitchen GERD (gastroesophageal reflux disease)   . Hyperlipidemia   . Hypertension   . Osteoarthritis, multiple sites   . Osteopenia   . Persistent atrial fibrillation ([place])    a. Dx 08/2017; b. CHA2DS2VASc = 6-->Xarelto; c. 09/2017 Successful DCCV (second shock - 200J);   Marland Kitchen Personal history of radiation therapy 2001   left breast ca  . Psoriasis     Past Surgical History:  Procedure Laterality Date  . ABDOMINAL HYSTERECTOMY    . ANKLE FRACTURE SURGERY  4/08   left---hardware still in place  . 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 BREAST EXCISIONAL BIOPSY Left yrs ago   benign  . BREAST LUMPECTOMY Left 2001   f/u radiation  . CARDIOVERSION N/A 10/04/2017   Procedure: CARDIOVERSION;  Surgeon: Wellington Hampshire, MD;  Location: [facility] ORS;  Service: Cardiovascular;  Laterality: N/A;   . CARDIOVERSION N/A 12/16/2017   Procedure: CARDIOVERSION;  Surgeon: [name], MD;  Location: [facility] ORS;  Service: Cardiovascular;  Laterality: N/A;  . CAROTID ENDARTERECTOMY Left 10/23/2004  . FRACTURE SURGERY    . OOPHORECTOMY    . SHOULDER SURGERY  6/07   left  . TONSILLECTOMY AND ADENOIDECTOMY    . TOTAL HIP ARTHROPLASTY  2004   right    There were no vitals filed for this visit.  Subjective Assessment - 02/09/18 0955    Subjective  Pt stated that back is feeling very good and that she has been walking every day for the past few weeks.     Pertinent History  Patient previously seen for balance difficulties and shoulder pain and the right.  Patient reports no falls in the past six months. Patient reports pain has not improved for the past month and has been about the same since the onset of pain.     Limitations  Walking    Patient Stated Goals  To improve balance when walking     Currently in Pain?  Yes    Pain Score  1     Pain Location  Back    Pain Orientation  Right;Left;Lower    Pain  Descriptors / Indicators  Aching    Pain Onset  More than a month ago       TREATMENT Therapeutic Exercises Pelvic tilts ant/post x20 LTRs x30 SKTC stretch x15 B 3s Hips/knees 90/90 with overhead reaches with red physio ball focused on core engagement 10x2 Hooklying hip abduction isometric hold with belt 3s x20  Seated pelvic tilts (incr discomfort with post pelvic tilt) Hooklying bridges BLE 10x2 Palof press 5# 10x2 B Standing hip ext x20 B with YTB Standing hip abduction with YTB x20 B TRX sit to stands with YTB around distal thighs to decr knee valgus 10x3     Pt demonstrates fatigue at end of session. No increase in pain throughout therapeutic exercises.        PT Education - 02/09/18 0957    Education provided  Yes    Education Details  Pt educated on proper form and technique for therapeutic exercises.    Person(s) Educated  Patient    Methods   Explanation;Demonstration    Comprehension  Verbalized understanding;Returned demonstration          PT Long Term Goals - 01/06/18 1645      PT LONG TERM GOAL #1   Title  Patient will be independent with HEP to continue benefits of therapy after discharge.     Baseline  Dependent with form and technique for balancing exercises; moderate cueing for balance and strengthening    Time  4    Period  Weeks    Status  On-going      PT LONG TERM GOAL #2   Title  Patient will improve FGA to over 26/30 to indicate functional improvement in strength and balance limitations and decrease fall risk    Baseline  22/30; ; 09/01/2017: 24/30; 09/30/2017: 25/30; 11/03/2017: 25/30    Time  8    Period  Weeks    Status  On-going      PT LONG TERM GOAL #3   Title  Patient will improve 5xSTS to under 16 seconds without use of a pillow to indicate functional improvement of LE function and decreased fall risk    Baseline  19 sec with pillow under chair; 09/01/2017 12 seconds with use of a pillow; 09/30/2017: 10.9 sec with use of pillow; defered today secondary to increased soreness from walking the previous day    Time  8    Period  Weeks    Status  On-going      PT LONG TERM GOAL #4   Title  Patient will be able to balance for 10sec with SLS to improve static balance and ability to get dressed when in standing    Baseline  1 sec; 09/01/2017: 3 sec each leg; 09/30/17: 4.5; 11/03/2017: 5 sec    Time  8    Period  Weeks    Status  On-going      PT LONG TERM GOAL #5   Title  Patient will have a worst lumbar pain of a 3/10 over the past week to indicate functional improvement with performing standing activities and prolonged sitting.    Baseline  Worst pain: 8/10    Time  6    Period  Weeks    Status  New      Additional Long Term Goals   Additional Long Term Goals  Yes      PT LONG TERM GOAL #6   Title  Patient will be able to sit for >1 hour to improve abiltity to  drive without increase in pain.     Baseline  Increased pain after driving for <78GNF    Time  6    Period  Weeks    Status  New            Plan - 02/09/18 1034    Clinical Impression Statement  Pt demonstrates decreased difficulty with core exercises and is able to complete incr # of repetitions with fewer rest breaks. Pt also demonstrates incr tolerance to hip extension exercise as indicated by her ability to perform standing hip extension with YTB. Pt continues to demonstrate knee valgus with sit to stands but is able to correct with use of TB and verbal cueing. Pt will continue to benefit from skilled PT in order to return to prior level of function.     Rehab Potential  Good    Clinical Impairments Affecting Rehab Potential  Positive: motivation; Negative: , age    PT Frequency  2x / week    PT Duration  6 weeks    PT Treatment/Interventions  ADLs/Self Care Home Management;Aquatic Therapy;Biofeedback;DME Instruction;Gait training;Stair training;Functional mobility training;Therapeutic activities;Therapeutic exercise;Balance training;Neuromuscular re-education;Patient/family education;Passive range of motion;Energy conservation;Ultrasound;Moist Heat;Iontophoresis 4mg /ml Dexamethasone;Cryotherapy;Electrical Stimulation;Manual techniques;Dry needling    PT Next Visit Plan  modified bird dog, incr LE marches     PT Home Exercise Plan  see education section    Consulted and Agree with Plan of Care  Patient       Patient will benefit from skilled therapeutic intervention in order to improve the following deficits and impairments:  Pain, Increased fascial restricitons, Decreased coordination, Decreased mobility, Decreased activity tolerance, Decreased endurance, Decreased range of motion, Decreased strength, Hypomobility, Postural dysfunction, Abnormal gait, Decreased balance, Difficulty walking  Visit Diagnosis: Muscle weakness (generalized)  History of falling  Acute bilateral low back pain without  sciatica  Unsteadiness on feet     Problem List Patient Active Problem List   Diagnosis Date Noted  . Chronic bilateral low back pain without sciatica 12/31/2017  . Hypomagnesemia 09/29/2017  . Hypokalemia 09/29/2017  . Atrial fibrillation ([place]) 09/10/2017  . Trapezius strain, right, initial encounter 09/10/2017  . Depression, major, single episode, mild ([place]) 09/10/2017  . Chronic venous insufficiency 09/09/2017  . Lymphedema 09/09/2017  . Pain of toe of left foot 08/17/2017  . Grief 08/17/2017  . Epistaxis 07/31/2017  . Leg swelling 07/31/2017  . Dry eyes 07/06/2017  . Pain and swelling of lower extremity, right 03/29/2017  . Right shoulder pain 03/29/2017  . Diarrhea 05/18/2016  . Fall 05/01/2016  . Unintentional weight loss 12/30/2015  . Leg weakness, bilateral 12/30/2015  . Hyperkalemia 01/01/2015  . Occasional numbness/prickling/tingling of fingers and toes 12/18/2014  . Routine general medical examination at a health care facility 12/13/2013  . Macrocytic anemia 05/19/2013  . Psoriatic arthritis ([place]) 02/03/2013  . Psoriasis   . Osteoarthritis, multiple sites   . Episodic mood disorder ([place]) 12/01/2011  . History of breast cancer 10/28/2011  . GERD (gastroesophageal reflux disease)   . Hyperlipidemia   . Hypertension   . Osteopenia   . Carotid stenosis 05/27/2011    Georg Ruddle, SPT 02/09/2018, 1:20 PM  [place] [address] [place], [address] Phone: [phone]   Fax:  [phone]  Name: CHUNDRA SAUERWEIN MRN: [id number] Date of Birth: [birth year]

## 2018-02-14 ENCOUNTER — Ambulatory Visit: Payer: Medicare Other

## 2018-02-14 DIAGNOSIS — M6281 Muscle weakness (generalized): Secondary | ICD-10-CM | POA: Diagnosis not present

## 2018-02-14 DIAGNOSIS — Z9181 History of falling: Secondary | ICD-10-CM | POA: Diagnosis not present

## 2018-02-14 DIAGNOSIS — M545 Low back pain, unspecified: Secondary | ICD-10-CM

## 2018-02-14 DIAGNOSIS — R2681 Unsteadiness on feet: Secondary | ICD-10-CM | POA: Diagnosis not present

## 2018-02-14 NOTE — Therapy (Addendum)
[name] [facility] [address] [place], [address] Phone: [phone]   Fax:  [phone]  Physical Therapy Treatment/ Progress Note  Patient Details  Name: [name] MRN: [id number] Date of Birth: 1941/02/04 Referring Provider: Caryl Bis MD   Encounter Date: 02/14/2018  PT End of Session - 02/14/18 1240    Visit Number  10    Number of Visits  13    Date for PT Re-Evaluation  02/17/18    Authorization Type  10/10    PT Start Time  0945    PT Stop Time  1030    PT Time Calculation (min)  45 min    Activity Tolerance  Patient tolerated treatment well    Behavior During Therapy  [facility] for tasks assessed/performed       Past Medical History:  Diagnosis Date  . (HFpEF) heart failure with preserved ejection fraction ([place])    a. 08/2017 Echo: EF 55-60%, no rwma, mild MR, mildly dil LA, nl RV fxn.  . Breast cancer ([place]) 2001   left breast  . Cancer ([place]) 2001   left breast ca  . Carotid arterial disease ([place])    a. 10/2004 s/p L CEA; b. 12/2015 Carotid U/S: RICA 1-39%; b. LICA patent CEA site.  Marland Kitchen GERD (gastroesophageal reflux disease)   . Hyperlipidemia   . Hypertension   . Osteoarthritis, multiple sites   . Osteopenia   . Persistent atrial fibrillation ([place])    a. Dx 08/2017; b. CHA2DS2VASc = 6-->Xarelto; c. 09/2017 Successful DCCV (second shock - 200J);   Marland Kitchen Personal history of radiation therapy 2001   left breast ca  . Psoriasis     Past Surgical History:  Procedure Laterality Date  . ABDOMINAL HYSTERECTOMY    . ANKLE FRACTURE SURGERY  4/08   left---hardware still in place  . 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 BREAST EXCISIONAL BIOPSY Left yrs ago   benign  . BREAST LUMPECTOMY Left 2001   f/u radiation  . CARDIOVERSION N/A 10/04/2017   Procedure: CARDIOVERSION;  Surgeon: Wellington Hampshire, MD;  Location: [facility] ORS;  Service: Cardiovascular;  Laterality: N/A;  . CARDIOVERSION N/A 12/16/2017    Procedure: CARDIOVERSION;  Surgeon: [name], MD;  Location: [facility] ORS;  Service: Cardiovascular;  Laterality: N/A;  . CAROTID ENDARTERECTOMY Left 10/23/2004  . FRACTURE SURGERY    . OOPHORECTOMY    . SHOULDER SURGERY  6/07   left  . TONSILLECTOMY AND ADENOIDECTOMY    . TOTAL HIP ARTHROPLASTY  2004   right    There were no vitals filed for this visit.  Subjective Assessment - 02/14/18 1232    Subjective  Pt states that back has been feeling well and that she has continued to go on daily walks. Pt also states that back pain is worse in the morning but that it is not as intense and does not last as long as it had previously and that the pain eases as she "gets moving".    Pertinent History  Patient previously seen for balance difficulties and shoulder pain and the right.  Patient reports no falls in the past six months. Patient reports pain has not improved for the past month and has been about the same since the onset of pain.     Limitations  Walking    Patient Stated Goals  To improve balance when walking     Currently in Pain?  No/denies  TREATMENT Therapeutic Exercises Pelvic tilts ant/post x20 LTRs x30 Hips/knees 90/90 with overhead reaches with red physio ball focused on core engagement 10x2 Hooklying hip abduction isometric hold with belt 3s x20  Hooklying bridges BLE 10x2 (verbal cueing to incr foot width and hip flexion to decr stress on lumbar spine) Palof press 5# 10x2 B Standing hip ext x20 B with YTB  Standing hip abduction with YTB x20 B TRX sit to stands with YTB around distal thighs to decr knee valgus 10x3  Lateral amb on airex beam with heels off to incr activation of calves x6 passes (occasional UE support required) Tandem amb on airex beam x6 passes   Pt reports no increase in pain at end of treatment session.     PT Education - 02/14/18 1237    Education provided  Yes    Education Details  Pt educated on proper form and technique for  therapeutic exercises.    Person(s) Educated  Patient    Methods  Explanation;Demonstration    Comprehension  Verbalized understanding;Returned demonstration          PT Long Term Goals - 02/14/18 1737      PT LONG TERM GOAL #1   Title  Patient will be independent with HEP to continue benefits of therapy after discharge.     Baseline  Dependent with form and technique for balancing exercises; moderate cueing for balance and strengthening; 02/14/2018: Independent with HEP     Time  4    Period  Weeks    Status  Achieved      PT LONG TERM GOAL #2   Title  Patient will improve FGA to over 26/30 to indicate functional improvement in strength and balance limitations and decrease fall risk    Baseline  22/30; ; 09/01/2017: 24/30; 09/30/2017: 25/30; 11/03/2017: 25/30; 02/14/2018: deferred to next visit    Time  8    Period  Weeks    Status  On-going      PT LONG TERM GOAL #3   Title  Patient will improve 5xSTS to under 16 seconds without use of a pillow to indicate functional improvement of LE function and decreased fall risk    Baseline  19 sec with pillow under chair; 09/01/2017 12 seconds with use of a pillow; 09/30/2017: 10.9 sec with use of pillow; defered today secondary to increased soreness from walking the previous day    Time  8    Period  Weeks    Status  Partially Met      PT LONG TERM GOAL #4   Title  Patient will be able to balance for 10sec with SLS to improve static balance and ability to get dressed when in standing    Baseline  1 sec; 09/01/2017: 3 sec each leg; 09/30/17: 4.5; 11/03/2017: 5 sec; 02/14/2018: Deffered to next visit    Time  8    Period  Weeks    Status  On-going      PT LONG TERM GOAL #5   Title  Patient will have a worst lumbar pain of a 3/10 over the past week to indicate functional improvement with performing standing activities and prolonged sitting.    Baseline  Worst pain: 8/10; 02/14/2018: worst pain: 7/10    Time  6    Period  Weeks    Status   On-going      PT LONG TERM GOAL #6   Title  Patient will be able to sit for >1 hour to   improve abiltity to drive without increase in pain.    Baseline  Increased pain after driving for <30min; 02/14/2018: Patient reports increase in pain for 30min    Time  6    Period  Weeks    Status  On-going            Plan - 02/14/18 1241    Clinical Impression Statement Patient is making progress towards long term goals with decreased low back pain compared to previous sessions but continues to demonstrate increased dysfunction such as decreased hip strength and poor muscular coordination.  In relation to previous session, pt continues to demonstrate significant hip dysfunction and lumbar spine dysfunction as indicated by increased difficulty performing bridge exercise at full range and decreased tolerance to this therapeutic exercise. Pt also demonstrates decr core stability indicated by difficulty performing LE marching while seated on physio ball. Pt requires decreased verbal cueing for proper form with standing hip exercises and decr knee valgus during TRX squats. Pt also required significant verbal and tactile cueing to achieve proper anterior and pelvic tilting when seated on physio ball. With practice, pt improved ability to achieve ant/post pelvic tilt and required decreased cueing throughout repitions. Pt will continue to benefit from skilled PT in order to return to prior level of function.    Rehab Potential  Good    Clinical Impairments Affecting Rehab Potential  Positive: motivation; Negative: , age    PT Frequency  2x / week    PT Duration  6 weeks    PT Treatment/Interventions  ADLs/Self Care Home Management;Aquatic Therapy;Biofeedback;DME Instruction;Gait training;Stair training;Functional mobility training;Therapeutic activities;Therapeutic exercise;Balance training;Neuromuscular re-education;Patient/family education;Passive range of motion;Energy conservation;Ultrasound;Moist  Heat;Iontophoresis 4mg/ml Dexamethasone;Cryotherapy;Electrical Stimulation;Manual techniques;Dry needling    PT Next Visit Plan  modified bird dog, incr LE marches, LE marches on physio ball     PT Home Exercise Plan  see education section    Consulted and Agree with Plan of Care  Patient       Patient will benefit from skilled therapeutic intervention in order to improve the following deficits and impairments:  Pain, Increased fascial restricitons, Decreased coordination, Decreased mobility, Decreased activity tolerance, Decreased endurance, Decreased range of motion, Decreased strength, Hypomobility, Postural dysfunction, Abnormal gait, Decreased balance, Difficulty walking  Visit Diagnosis: Muscle weakness (generalized)  Acute bilateral low back pain without sciatica  History of falling     Problem List Patient Active Problem List   Diagnosis Date Noted  . Chronic bilateral low back pain without sciatica 12/31/2017  . Hypomagnesemia 09/29/2017  . Hypokalemia 09/29/2017  . Atrial fibrillation (HCC) 09/10/2017  . Trapezius strain, right, initial encounter 09/10/2017  . Depression, major, single episode, mild (HCC) 09/10/2017  . Chronic venous insufficiency 09/09/2017  . Lymphedema 09/09/2017  . Pain of toe of left foot 08/17/2017  . Grief 08/17/2017  . Epistaxis 07/31/2017  . Leg swelling 07/31/2017  . Dry eyes 07/06/2017  . Pain and swelling of lower extremity, right 03/29/2017  . Right shoulder pain 03/29/2017  . Diarrhea 05/18/2016  . Fall 05/01/2016  . Unintentional weight loss 12/30/2015  . Leg weakness, bilateral 12/30/2015  . Hyperkalemia 01/01/2015  . Occasional numbness/prickling/tingling of fingers and toes 12/18/2014  . Routine general medical examination at a health care facility 12/13/2013  . Macrocytic anemia 05/19/2013  . Psoriatic arthritis (HCC) 02/03/2013  . Psoriasis   . Osteoarthritis, multiple sites   . Episodic mood disorder (HCC) 12/01/2011  .  History of breast cancer 10/28/2011  . GERD (gastroesophageal reflux disease)   .   Hyperlipidemia   . Hypertension   . Osteopenia   . Carotid stenosis 05/27/2011    Georg Ruddle, SPT 02/14/2018, 12:46 PM  [place] [facility] [facility] [address] [place], [address] Phone: [phone]   Fax:  [phone]  Name: SINDI BECKWORTH MRN: [id number] Date of Birth: Nov 10, 1940

## 2018-02-16 ENCOUNTER — Ambulatory Visit: Payer: Medicare Other

## 2018-02-16 DIAGNOSIS — Z9181 History of falling: Secondary | ICD-10-CM

## 2018-02-16 DIAGNOSIS — R2681 Unsteadiness on feet: Secondary | ICD-10-CM | POA: Diagnosis not present

## 2018-02-16 DIAGNOSIS — M545 Low back pain, unspecified: Secondary | ICD-10-CM

## 2018-02-16 DIAGNOSIS — M6281 Muscle weakness (generalized): Secondary | ICD-10-CM | POA: Diagnosis not present

## 2018-02-16 NOTE — Therapy (Signed)
[place] [facility] [address], [place], [address] Phone: [phone]   Fax:  [phone]  Physical Therapy Treatment  Patient Details  Name: [name] MRN: [id number] Date of Birth: Feb 27, 1941 Referring Provider: Caryl Bis MD   Encounter Date: 02/16/2018  PT End of Session - 02/16/18 1023    Visit Number  11    Number of Visits  13    Date for PT Re-Evaluation  02/17/18    PT Start Time  0835    PT Stop Time  0915    PT Time Calculation (min)  40 min    Equipment Utilized During Treatment  Gait belt    Activity Tolerance  Patient tolerated treatment well    Behavior During Therapy  [facility] for tasks assessed/performed       Past Medical History:  Diagnosis Date  . (HFpEF) heart failure with preserved ejection fraction ([place])    a. 08/2017 Echo: EF 55-60%, no rwma, mild MR, mildly dil LA, nl RV fxn.  . Breast cancer ([place]) 2001   left breast  . Cancer ([place]) 2001   left breast ca  . Carotid arterial disease ([place])    a. 10/2004 s/p L CEA; b. 12/2015 Carotid U/S: RICA 1-39%; b. LICA patent CEA site.  Marland Kitchen GERD (gastroesophageal reflux disease)   . Hyperlipidemia   . Hypertension   . Osteoarthritis, multiple sites   . Osteopenia   . Persistent atrial fibrillation ([place])    a. Dx 08/2017; b. CHA2DS2VASc = 6-->Xarelto; c. 09/2017 Successful DCCV (second shock - 200J);   Marland Kitchen Personal history of radiation therapy 2001   left breast ca  . Psoriasis     Past Surgical History:  Procedure Laterality Date  . ABDOMINAL HYSTERECTOMY    . ANKLE FRACTURE SURGERY  4/08   left---hardware still in place  . 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 BREAST EXCISIONAL BIOPSY Left yrs ago   benign  . BREAST LUMPECTOMY Left 2001   f/u radiation  . CARDIOVERSION N/A 10/04/2017   Procedure: CARDIOVERSION;  Surgeon: Wellington Hampshire, MD;  Location: [facility] ORS;  Service: Cardiovascular;  Laterality: N/A;  . CARDIOVERSION N/A  12/16/2017   Procedure: CARDIOVERSION;  Surgeon: [name], MD;  Location: [facility] ORS;  Service: Cardiovascular;  Laterality: N/A;  . CAROTID ENDARTERECTOMY Left 10/23/2004  . FRACTURE SURGERY    . OOPHORECTOMY    . SHOULDER SURGERY  6/07   left  . TONSILLECTOMY AND ADENOIDECTOMY    . TOTAL HIP ARTHROPLASTY  2004   right    There were no vitals filed for this visit.  Subjective Assessment - 02/16/18 1215    Subjective  Pt states that she had no back pain this morning when she woke up.     Pertinent History  Patient previously seen for balance difficulties and shoulder pain and the right.  Patient reports no falls in the past six months. Patient reports pain has not improved for the past month and has been about the same since the onset of pain.     Limitations  Walking    Patient Stated Goals  To improve balance when walking     Currently in Pain?  No/denies    Pain Score  0-No pain        TREATMENT Therapeutic Exercises Pelvic tilts ant/post x20 LTRs x20 Hips/knees 90/90 with overhead reaches with red physio ball focused on core engagement 10x2 Hips/knees 90/90  with LE marches 2x15 Hooklying hip abduction isometric hold with belt 3s x20  Sit to stands from black mat with 1kg med ball held in front with arms extended 2x10 Hooklying bridges BLE 10x2 Palof press 5# 10x2 B Standing hip ext x20 B with YTB x20 B Standing hip abduction with YTB x20 B TRX sit to stands 2x10   Pt demonstrates fatigue at end of session. No increase in pain throughout therapeutic exercises.     PT Education - 02/16/18 1022    Education provided  Yes    Education Details  Pt educated on proper form and technique for therapeutic exercises.    Person(s) Educated  Patient    Methods  Explanation;Demonstration    Comprehension  Verbalized understanding;Returned demonstration          PT Long Term Goals - 01/06/18 1645      PT LONG TERM GOAL #1   Title  Patient will be independent with  HEP to continue benefits of therapy after discharge.     Baseline  Dependent with form and technique for balancing exercises; moderate cueing for balance and strengthening    Time  4    Period  Weeks    Status  On-going      PT LONG TERM GOAL #2   Title  Patient will improve FGA to over 26/30 to indicate functional improvement in strength and balance limitations and decrease fall risk    Baseline  22/30; ; 09/01/2017: 24/30; 09/30/2017: 25/30; 11/03/2017: 25/30    Time  8    Period  Weeks    Status  On-going      PT LONG TERM GOAL #3   Title  Patient will improve 5xSTS to under 16 seconds without use of a pillow to indicate functional improvement of LE function and decreased fall risk    Baseline  19 sec with pillow under chair; 09/01/2017 12 seconds with use of a pillow; 09/30/2017: 10.9 sec with use of pillow; defered today secondary to increased soreness from walking the previous day    Time  8    Period  Weeks    Status  On-going      PT LONG TERM GOAL #4   Title  Patient will be able to balance for 10sec with SLS to improve static balance and ability to get dressed when in standing    Baseline  1 sec; 09/01/2017: 3 sec each leg; 09/30/17: 4.5; 11/03/2017: 5 sec    Time  8    Period  Weeks    Status  On-going      PT LONG TERM GOAL #5   Title  Patient will have a worst lumbar pain of a 3/10 over the past week to indicate functional improvement with performing standing activities and prolonged sitting.    Baseline  Worst pain: 8/10    Time  6    Period  Weeks    Status  New      Additional Long Term Goals   Additional Long Term Goals  Yes      PT LONG TERM GOAL #6   Title  Patient will be able to sit for >1 hour to improve abiltity to drive without increase in pain.    Baseline  Increased pain after driving for <43XVQ    Time  [place] - 02/16/18 1243  Clinical Impression Statement  Pt presents with increased tolerance to  bridging exercise today and is able to complete the exercise at an increased range with no increase in pain. Pt also required decreased cueing for decreasing knee valgus with sit to stand exercise and TRX squats. Pt continues to demonstrate decr core stability as indicated by need for UE support during LE marches on physio ball. Pt will benefit from continued skilled PT in order to return to prior level of function.    Rehab Potential  Good    Clinical Impairments Affecting Rehab Potential  Positive: motivation; Negative: , age    PT Frequency  2x / week    PT Duration  6 weeks    PT Treatment/Interventions  ADLs/Self Care Home Management;Aquatic Therapy;Biofeedback;DME Instruction;Gait training;Stair training;Functional mobility training;Therapeutic activities;Therapeutic exercise;Balance training;Neuromuscular re-education;Patient/family education;Passive range of motion;Energy conservation;Ultrasound;Moist Heat;Iontophoresis 4mg /ml Dexamethasone;Cryotherapy;Electrical Stimulation;Manual techniques;Dry needling    PT Next Visit Plan  modified bird dog, incr LE marches, LE marches on physio ball with no UE support    PT Home Exercise Plan  see education section    Consulted and Agree with Plan of Care  Patient       Patient will benefit from skilled therapeutic intervention in order to improve the following deficits and impairments:  Pain, Increased fascial restricitons, Decreased coordination, Decreased mobility, Decreased activity tolerance, Decreased endurance, Decreased range of motion, Decreased strength, Hypomobility, Postural dysfunction, Abnormal gait, Decreased balance, Difficulty walking  Visit Diagnosis: Muscle weakness (generalized)  Acute bilateral low back pain without sciatica  History of falling     Problem List Patient Active Problem List   Diagnosis Date Noted  . Chronic bilateral low back pain without sciatica 12/31/2017  . Hypomagnesemia 09/29/2017  . Hypokalemia  09/29/2017  . Atrial fibrillation ([place]) 09/10/2017  . Trapezius strain, right, initial encounter 09/10/2017  . Depression, major, single episode, mild ([place]) 09/10/2017  . Chronic venous insufficiency 09/09/2017  . Lymphedema 09/09/2017  . Pain of toe of left foot 08/17/2017  . Grief 08/17/2017  . Epistaxis 07/31/2017  . Leg swelling 07/31/2017  . Dry eyes 07/06/2017  . Pain and swelling of lower extremity, right 03/29/2017  . Right shoulder pain 03/29/2017  . Diarrhea 05/18/2016  . Fall 05/01/2016  . Unintentional weight loss 12/30/2015  . Leg weakness, bilateral 12/30/2015  . Hyperkalemia 01/01/2015  . Occasional numbness/prickling/tingling of fingers and toes 12/18/2014  . Routine general medical examination at a health care facility 12/13/2013  . Macrocytic anemia 05/19/2013  . Psoriatic arthritis ([place]) 02/03/2013  . Psoriasis   . Osteoarthritis, multiple sites   . Episodic mood disorder ([place]) 12/01/2011  . History of breast cancer 10/28/2011  . GERD (gastroesophageal reflux disease)   . Hyperlipidemia   . Hypertension   . Osteopenia   . Carotid stenosis 05/27/2011    Georg Ruddle, SPT 02/16/2018, 12:46 PM  [place] [facility] [address] [place], [address] Phone: [phone]   Fax:  [phone]  Name: [name] MRN: [id number] Date of Birth: September 07, 1940

## 2018-02-21 ENCOUNTER — Ambulatory Visit: Payer: Medicare Other | Attending: Family Medicine

## 2018-02-21 DIAGNOSIS — M6281 Muscle weakness (generalized): Secondary | ICD-10-CM | POA: Diagnosis not present

## 2018-02-21 DIAGNOSIS — M545 Low back pain, unspecified: Secondary | ICD-10-CM

## 2018-02-21 DIAGNOSIS — Z9181 History of falling: Secondary | ICD-10-CM | POA: Insufficient documentation

## 2018-02-21 DIAGNOSIS — R2681 Unsteadiness on feet: Secondary | ICD-10-CM | POA: Diagnosis not present

## 2018-02-21 NOTE — Addendum Note (Signed)
Addended by: Blain Pais on: 02/21/2018 10:10 AM   Modules accepted: Orders

## 2018-02-21 NOTE — Therapy (Signed)
[facility] [address] [place], [place], [address] Phone: [phone]   Fax:  [phone]  Physical Therapy Treatment  Patient Details  Name: [name] MRN: [id number] Date of Birth: 09/25/1940 Referring Provider: Sonnenberg MD   Encounter Date: 02/21/2018  PT End of Session - 02/21/18 1009    Visit Number  12    Number of Visits  21    Date for PT Re-Evaluation  03/14/18    Authorization Type  2/10    PT Start Time  1002    PT Stop Time  1047    PT Time Calculation (min)  45 min    Activity Tolerance  Patient tolerated treatment well    Behavior During Therapy  WFL for tasks assessed/performed       Past Medical History:  Diagnosis Date  . (HFpEF) heart failure with preserved ejection fraction (HCC)    a. 08/2017 Echo: EF 55-60%, no rwma, mild MR, mildly dil LA, nl RV fxn.  . Breast cancer (HCC) 2001   left breast  . Cancer (HCC) 2001   left breast ca  . Carotid arterial disease (HCC)    a. 10/2004 s/p L CEA; b. 12/2015 Carotid U/S: RICA 1-39%; b. LICA patent CEA site.  . GERD (gastroesophageal reflux disease)   . Hyperlipidemia   . Hypertension   . Osteoarthritis, multiple sites   . Osteopenia   . Persistent atrial fibrillation (HCC)    a. Dx 08/2017; b. CHA2DS2VASc = 6-->Xarelto; c. 09/2017 Successful DCCV (second shock - 200J);   . Personal history of radiation therapy 2001   left breast ca  . Psoriasis     Past Surgical History:  Procedure Laterality Date  . ABDOMINAL HYSTERECTOMY    . ANKLE FRACTURE SURGERY  4/08   left---hardware still in place  . 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 BREAST EXCISIONAL BIOPSY Left yrs ago   benign  . BREAST LUMPECTOMY Left 2001   f/u radiation  . CARDIOVERSION N/A 10/04/2017   Procedure: CARDIOVERSION;  Surgeon: Arida, Muhammad A, MD;  Location: [facility] ORS;  Service: Cardiovascular;  Laterality: N/A;  . CARDIOVERSION N/A 12/16/2017   Procedure:  CARDIOVERSION;  Surgeon: Klein, Steven C, MD;  Location: [facility] ORS;  Service: Cardiovascular;  Laterality: N/A;  . CAROTID ENDARTERECTOMY Left 10/23/2004  . FRACTURE SURGERY    . OOPHORECTOMY    . SHOULDER SURGERY  6/07   left  . TONSILLECTOMY AND ADENOIDECTOMY    . TOTAL HIP ARTHROPLASTY  2004   right    There were no vitals filed for this visit.  Subjective Assessment - 02/21/18 1005    Subjective  Pt states that back feels "ok" and that it ocmes and goes. She did not have a chance to do any exercises or walk this morning so she is feeling slightly stiff.     Pertinent History  Patient previously seen for balance difficulties and shoulder pain and the right.  Patient reports no falls in the past six months. Patient reports pain has not improved for the past month and has been about the same since the onset of pain.     Limitations  Walking    Patient Stated Goals  To improve balance when walking     Currently in Pain?  Yes    Pain Score  1     Pain Location  Back    Pain Orientation  Right;Left;Lower      Pain Descriptors / Indicators  Aching    Pain Type  Chronic pain;Acute pain    Pain Onset  More than a month ago        TREATMENT Therapeutic Exercises Pelvic tilts ant/post x20 LTRs x20 Hips/knees 90/90 with overhead reaches with red physio ball focused on core engagement 10x3 Hip/knees 90/90 with LE marches at further distance from body 2x7 Seated pelvic tilts x20 Sit <> stand with 3#DB 2x10 from black mat (elevated)  Standing hip ext with YTB 3x10  Standing hip abd with YTB 2x10  Chops with YTB 2x10 B Reverse chops with YTB x10 B Lateral amb on airex beam x6 with focus on keeping forefoot on beam and heels off of ground TRX squats 3x10  Pt fatigued at end of treatment session. No increase in pain throughout therapeutic exercises.     PT Education - 02/21/18 1009    Education provided  Yes    Education Details  Patient educated on proper form and technique for  therapeutic exercises.    Person(s) Educated  Patient    Methods  Explanation;Demonstration    Comprehension  Verbalized understanding;Returned demonstration          PT Long Term Goals - 02/14/18 1737      PT LONG TERM GOAL #1   Title  Patient will be independent with HEP to continue benefits of therapy after discharge.     Baseline  Dependent with form and technique for balancing exercises; moderate cueing for balance and strengthening; 02/14/2018: Independent with HEP     Time  4    Period  Weeks    Status  Achieved      PT LONG TERM GOAL #2   Title  Patient will improve FGA to over 26/30 to indicate functional improvement in strength and balance limitations and decrease fall risk    Baseline  22/30; ; 09/01/2017: 24/30; 09/30/2017: 25/30; 11/03/2017: 25/30; 02/14/2018: deferred to next visit    Time  8    Period  Weeks    Status  On-going      PT LONG TERM GOAL #3   Title  Patient will improve 5xSTS to under 16 seconds without use of a pillow to indicate functional improvement of LE function and decreased fall risk    Baseline  19 sec with pillow under chair; 09/01/2017 12 seconds with use of a pillow; 09/30/2017: 10.9 sec with use of pillow; defered today secondary to increased soreness from walking the previous day    Time  8    Period  Weeks    Status  Partially Met      PT LONG TERM GOAL #4   Title  Patient will be able to balance for 10sec with SLS to improve static balance and ability to get dressed when in standing    Baseline  1 sec; 09/01/2017: 3 sec each leg; 09/30/17: 4.5; 11/03/2017: 5 sec; 02/14/2018: Deffered to next visit    Time  8    Period  Weeks    Status  On-going      PT LONG TERM GOAL #5   Title  Patient will have a worst lumbar pain of a 3/10 over the past week to indicate functional improvement with performing standing activities and prolonged sitting.    Baseline  Worst pain: 8/10; 02/14/2018: worst pain: 7/10    Time  6    Period  Weeks    Status   On-going      PT LONG  TERM GOAL #6   Title  Patient will be able to sit for >1 hour to improve abiltity to drive without increase in pain.    Baseline  Increased pain after driving for <30min; 02/14/2018: Patient reports increase in pain for 30min    Time  6    Period  Weeks    Status  On-going            Plan - 02/21/18 1048    Clinical Impression Statement  Pt demonstrates improved tolerance to therapeutic exercises indicated by ability to perform increased reps with all therapeutic exercises today. Pt required significant verbal and tactile cueing to perform seated pelvic tilts and improved with practice. Pt continues to demonstrate decreased knee valgus with sit to stand and TRX squats. Pt will continue to benefit from skilled PT in order to return to prio level of function.    Rehab Potential  Good    Clinical Impairments Affecting Rehab Potential  Positive: motivation; Negative: , age    PT Frequency  2x / week    PT Duration  6 weeks    PT Treatment/Interventions  ADLs/Self Care Home Management;Aquatic Therapy;Biofeedback;DME Instruction;Gait training;Stair training;Functional mobility training;Therapeutic activities;Therapeutic exercise;Balance training;Neuromuscular re-education;Patient/family education;Passive range of motion;Energy conservation;Ultrasound;Moist Heat;Iontophoresis 4mg/ml Dexamethasone;Cryotherapy;Electrical Stimulation;Manual techniques;Dry needling    PT Next Visit Plan  modified bird dog, LE marches on physio ball with no UE support    PT Home Exercise Plan  see education section    Consulted and Agree with Plan of Care  Patient       Patient will benefit from skilled therapeutic intervention in order to improve the following deficits and impairments:  Pain, Increased fascial restricitons, Decreased coordination, Decreased mobility, Decreased activity tolerance, Decreased endurance, Decreased range of motion, Decreased strength, Hypomobility, Postural  dysfunction, Abnormal gait, Decreased balance, Difficulty walking  Visit Diagnosis: Muscle weakness (generalized)  Acute bilateral low back pain without sciatica  History of falling     Problem List Patient Active Problem List   Diagnosis Date Noted  . Chronic bilateral low back pain without sciatica 12/31/2017  . Hypomagnesemia 09/29/2017  . Hypokalemia 09/29/2017  . Atrial fibrillation (HCC) 09/10/2017  . Trapezius strain, right, initial encounter 09/10/2017  . Depression, major, single episode, mild (HCC) 09/10/2017  . Chronic venous insufficiency 09/09/2017  . Lymphedema 09/09/2017  . Pain of toe of left foot 08/17/2017  . Grief 08/17/2017  . Epistaxis 07/31/2017  . Leg swelling 07/31/2017  . Dry eyes 07/06/2017  . Pain and swelling of lower extremity, right 03/29/2017  . Right shoulder pain 03/29/2017  . Diarrhea 05/18/2016  . Fall 05/01/2016  . Unintentional weight loss 12/30/2015  . Leg weakness, bilateral 12/30/2015  . Hyperkalemia 01/01/2015  . Occasional numbness/prickling/tingling of fingers and toes 12/18/2014  . Routine general medical examination at a health care facility 12/13/2013  . Macrocytic anemia 05/19/2013  . Psoriatic arthritis (HCC) 02/03/2013  . Psoriasis   . Osteoarthritis, multiple sites   . Episodic mood disorder (HCC) 12/01/2011  . History of breast cancer 10/28/2011  . GERD (gastroesophageal reflux disease)   . Hyperlipidemia   . Hypertension   . Osteopenia   . Carotid stenosis 05/27/2011     , SPT 02/21/2018, 10:50 AM  [facility] [address] [place], [place], [address] Phone: [phone]   Fax:  [phone]  Name: Dennis S Freet MRN: [id number] Date of Birth: 05/02/1941   

## 2018-02-23 ENCOUNTER — Ambulatory Visit: Payer: Medicare Other

## 2018-02-24 ENCOUNTER — Ambulatory Visit: Payer: Medicare Other

## 2018-02-24 DIAGNOSIS — Z9181 History of falling: Secondary | ICD-10-CM | POA: Diagnosis not present

## 2018-02-24 DIAGNOSIS — M6281 Muscle weakness (generalized): Secondary | ICD-10-CM | POA: Diagnosis not present

## 2018-02-24 DIAGNOSIS — R2681 Unsteadiness on feet: Secondary | ICD-10-CM | POA: Diagnosis not present

## 2018-02-24 DIAGNOSIS — M545 Low back pain, unspecified: Secondary | ICD-10-CM

## 2018-02-24 NOTE — Therapy (Signed)
[facility] [address], [place], [address] Phone: [phone]   Fax:  [phone]  Physical Therapy Treatment  Patient Details  Name: [name] MRN: [id number] Date of Birth: Feb 06, 1941 Referring Provider: Caryl Bis MD   Encounter Date: 02/24/2018  PT End of Session - 02/24/18 1705    Visit Number  13    Number of Visits  21    Date for PT Re-Evaluation  03/14/18    Authorization Type  3/10    PT Start Time  9563    PT Stop Time  1630    PT Time Calculation (min)  45 min    Equipment Utilized During Treatment  Gait belt    Activity Tolerance  Patient tolerated treatment well    Behavior During Therapy  [facility] for tasks assessed/performed       Past Medical History:  Diagnosis Date  . (HFpEF) heart failure with preserved ejection fraction ([place])    a. 08/2017 Echo: EF 55-60%, no rwma, mild MR, mildly dil LA, nl RV fxn.  . Breast cancer ([place]) 2001   left breast  . Cancer ([place]) 2001   left breast ca  . Carotid arterial disease ([place])    a. 10/2004 s/p L CEA; b. 12/2015 Carotid U/S: RICA 1-39%; b. LICA patent CEA site.  Marland Kitchen GERD (gastroesophageal reflux disease)   . Hyperlipidemia   . Hypertension   . Osteoarthritis, multiple sites   . Osteopenia   . Persistent atrial fibrillation ([place])    a. Dx 08/2017; b. CHA2DS2VASc = 6-->Xarelto; c. 09/2017 Successful DCCV (second shock - 200J);   Marland Kitchen Personal history of radiation therapy 2001   left breast ca  . Psoriasis     Past Surgical History:  Procedure Laterality Date  . ABDOMINAL HYSTERECTOMY    . ANKLE FRACTURE SURGERY  4/08   left---hardware still in place  . 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 BREAST EXCISIONAL BIOPSY Left yrs ago   benign  . BREAST LUMPECTOMY Left 2001   f/u radiation  . CARDIOVERSION N/A 10/04/2017   Procedure: CARDIOVERSION;  Surgeon: Wellington Hampshire, MD;  Location: [facility] OR[facility];  Service: Cardiovascular;  Laterality: N/A;   . CARDIOVERSION N/A 12/16/2017   Procedure: CARDIOVERSION;  Surgeon: [name], MD;  Location: [facility] OR[facility];  Service: Cardiovascular;  Laterality: N/A;  . CAROTID ENDARTERECTOMY Left 10/23/2004  . FRACTURE SURGERY    . OOPHORECTOMY    . SHOULDER SURGERY  6/07   left  . TONSILLECTOMY AND ADENOIDECTOMY    . TOTAL HIP ARTHROPLASTY  2004   right    There were no vitals filed for this visit.  Subjective Assessment - 02/24/18 1704    Subjective  Pt states that back has been feeling good. She did HEP this morning before getting out of bed and put hemp oil on low back and has not had any back pain all day.     Pertinent History  Patient previously seen for balance difficulties and shoulder pain and the right.  Patient reports no falls in the past six months. Patient reports pain has not improved for the past month and has been about the same since the onset of pain.     Limitations  Walking    Patient Stated Goals  To improve balance when walking     Currently in Pain?  No/denies       TREATMENT Therapeutic Exercises Pelvic tilts x20 LTRs  x20 Bird dogs (LE only) x10 LE marches with focus on incr distance of heel taps Seated pelvic tilts on PB -- x 40 Hip extension with RTB -- x 20 B  Hip abduction with YTB -- x 20 B  Seated LE marches on physio ball with no UE support -- x 20 B  TRX sit to stands with UE support -- x20  Tandem ambulation -- 2 x 44f Ambulation backwards -- 2 x 315fAmbulation forward EC -- 2 x 3345f Patient demonstrates no increase in pain at the end of the session     PT Education - 02/24/18 1705    Education provided  Yes    Education Details  Patient educated on proper form and technique for therapeutic exercises.    Person(s) Educated  Patient    Methods  Explanation;Demonstration    Comprehension  Verbalized understanding;Returned demonstration          PT Long Term Goals - 02/14/18 1737      PT LONG TERM GOAL #1   Title  Patient will be  independent with HEP to continue benefits of therapy after discharge.     Baseline  Dependent with form and technique for balancing exercises; moderate cueing for balance and strengthening; 02/14/2018: Independent with HEP     Time  4    Period  Weeks    Status  Achieved      PT LONG TERM GOAL #2   Title  Patient will improve FGA to over 26/30 to indicate functional improvement in strength and balance limitations and decrease fall risk    Baseline  22/30; ; 09/01/2017: 24/30; 09/30/2017: 25/30; 11/03/2017: 25/30; 02/14/2018: deferred to next visit    Time  8    Period  Weeks    Status  On-going      PT LONG TERM GOAL #3   Title  Patient will improve 5xSTS to under 16 seconds without use of a pillow to indicate functional improvement of LE function and decreased fall risk    Baseline  19 sec with pillow under chair; 09/01/2017 12 seconds with use of a pillow; 09/30/2017: 10.9 sec with use of pillow; defered today secondary to increased soreness from walking the previous day    Time  8    Period  Weeks    Status  Partially Met      PT LONG TERM GOAL #4   Title  Patient will be able to balance for 10sec with SLS to improve static balance and ability to get dressed when in standing    Baseline  1 sec; 09/01/2017: 3 sec each leg; 09/30/17: 4.5; 11/03/2017: 5 sec; 02/14/2018: Deffered to next visit    Time  8    Period  Weeks    Status  On-going      PT LONG TERM GOAL #5   Title  Patient will have a worst lumbar pain of a 3/10 over the past week to indicate functional improvement with performing standing activities and prolonged sitting.    Baseline  Worst pain: 8/10; 02/14/2018: worst pain: 7/10    Time  6    Period  Weeks    Status  On-going      PT LONG TERM GOAL #6   Title  Patient will be able to sit for >1 hour to improve abiltity to drive without increase in pain.    Baseline  Increased pain after driving for <30<22GUR/24/27/0623atient reports increase in pain for 42m4m  Time  6     Period  Weeks    Status  On-going            Plan - 02/24/18 1707    Clinical Impression Statement  Pt demonstrates improved ability to complete seated pelvic tilt on physio ball with no verbal or tactile cueing required. Pt also demonstrates increased tolerance to resisted hip extension and is able to complete the exercise with a RTB instead of a YTB. Pt required significant verbal and tectile cueing for bird dog (LE only).  Pt will continue to benefit from skilled PT in order to return to prior level of function.    Rehab Potential  Good    Clinical Impairments Affecting Rehab Potential  Positive: motivation; Negative: , age    PT Frequency  2x / week    PT Duration  6 weeks    PT Next Visit Plan  higher level balance, bird dog LE only,     PT Home Exercise Plan  see education section    Consulted and Agree with Plan of Care  Patient       Patient will benefit from skilled therapeutic intervention in order to improve the following deficits and impairments:  Pain, Increased fascial restricitons, Decreased coordination, Decreased mobility, Decreased activity tolerance, Decreased endurance, Decreased range of motion, Decreased strength, Hypomobility, Postural dysfunction, Abnormal gait, Decreased balance, Difficulty walking  Visit Diagnosis: Muscle weakness (generalized)  Acute bilateral low back pain without sciatica  History of falling     Problem List Patient Active Problem List   Diagnosis Date Noted  . Chronic bilateral low back pain without sciatica 12/31/2017  . Hypomagnesemia 09/29/2017  . Hypokalemia 09/29/2017  . Atrial fibrillation ([place]) 09/10/2017  . Trapezius strain, right, initial encounter 09/10/2017  . Depression, major, single episode, mild ([place]) 09/10/2017  . Chronic venous insufficiency 09/09/2017  . Lymphedema 09/09/2017  . Pain of toe of left foot 08/17/2017  . Grief 08/17/2017  . Epistaxis 07/31/2017  . Leg swelling 07/31/2017  . Dry eyes  07/06/2017  . Pain and swelling of lower extremity, right 03/29/2017  . Right shoulder pain 03/29/2017  . Diarrhea 05/18/2016  . Fall 05/01/2016  . Unintentional weight loss 12/30/2015  . Leg weakness, bilateral 12/30/2015  . Hyperkalemia 01/01/2015  . Occasional numbness/prickling/tingling of fingers and toes 12/18/2014  . Routine general medical examination at a health care facility 12/13/2013  . Macrocytic anemia 05/19/2013  . Psoriatic arthritis ([place]) 02/03/2013  . Psoriasis   . Osteoarthritis, multiple sites   . Episodic mood disorder ([place]) 12/01/2011  . History of breast cancer 10/28/2011  . GERD (gastroesophageal reflux disease)   . Hyperlipidemia   . Hypertension   . Osteopenia   . Carotid stenosis 05/27/2011    Georg Ruddle, SPT 02/24/2018, 5:45 PM  [facility] [address], [place], [address] Phone: [phone]   Fax:  [phone]  Name: [name] MRN: [id number] Date of Birth: Aug 21, 1940

## 2018-02-28 ENCOUNTER — Ambulatory Visit: Payer: Medicare Other

## 2018-02-28 DIAGNOSIS — M6281 Muscle weakness (generalized): Secondary | ICD-10-CM | POA: Diagnosis not present

## 2018-02-28 DIAGNOSIS — Z9181 History of falling: Secondary | ICD-10-CM

## 2018-02-28 DIAGNOSIS — M545 Low back pain, unspecified: Secondary | ICD-10-CM

## 2018-02-28 DIAGNOSIS — R2681 Unsteadiness on feet: Secondary | ICD-10-CM | POA: Diagnosis not present

## 2018-02-28 NOTE — Therapy (Cosign Needed)
[facility] [address], [place], [address] Phone: [phone]   Fax:  [phone]  Physical Therapy Treatment  Patient Details  Name: [name] MRN: [id number] Date of Birth: Jun 22, 1941 Referring Provider: Caryl Bis MD   Encounter Date: 02/28/2018  PT End of Session - 02/28/18 1244    Visit Number  14    Number of Visits  21    Date for PT Re-Evaluation  03/14/18    Authorization Type  4/10    PT Start Time  1000    PT Stop Time  1045    PT Time Calculation (min)  45 min    Activity Tolerance  Patient tolerated treatment well    Behavior During Therapy  [facility] for tasks assessed/performed       Past Medical History:  Diagnosis Date  . (HFpEF) heart failure with preserved ejection fraction ([place])    a. 08/2017 Echo: EF 55-60%, no rwma, mild MR, mildly dil LA, nl RV fxn.  . Breast cancer ([place]) 2001   left breast  . Cancer ([place]) 2001   left breast ca  . Carotid arterial disease ([place])    a. 10/2004 s/p L CEA; b. 12/2015 Carotid U/S: RICA 1-39%; b. LICA patent CEA site.  Marland Kitchen GERD (gastroesophageal reflux disease)   . Hyperlipidemia   . Hypertension   . Osteoarthritis, multiple sites   . Osteopenia   . Persistent atrial fibrillation ([place])    a. Dx 08/2017; b. CHA2DS2VASc = 6-->Xarelto; c. 09/2017 Successful DCCV (second shock - 200J);   Marland Kitchen Personal history of radiation therapy 2001   left breast ca  . Psoriasis     Past Surgical History:  Procedure Laterality Date  . ABDOMINAL HYSTERECTOMY    . ANKLE FRACTURE SURGERY  4/08   left---hardware still in place  . 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 BREAST EXCISIONAL BIOPSY Left yrs ago   benign  . BREAST LUMPECTOMY Left 2001   f/u radiation  . CARDIOVERSION N/A 10/04/2017   Procedure: CARDIOVERSION;  Surgeon: Wellington Hampshire, MD;  Location: [facility] ORS;  Service: Cardiovascular;  Laterality: N/A;  . CARDIOVERSION N/A 12/16/2017   Procedure:  CARDIOVERSION;  Surgeon: [name], MD;  Location: [facility] ORS;  Service: Cardiovascular;  Laterality: N/A;  . CAROTID ENDARTERECTOMY Left 10/23/2004  . FRACTURE SURGERY    . OOPHORECTOMY    . SHOULDER SURGERY  6/07   left  . TONSILLECTOMY AND ADENOIDECTOMY    . TOTAL HIP ARTHROPLASTY  2004   right    There were no vitals filed for this visit.  Subjective Assessment - 02/28/18 1241    Subjective  Pt reports that back has been feeling ok but that this morning she did not have a chance to do exercises before getting out of bed. Pt reports that she bumped into a brick wall accidentally this morning andthat she noticed her strength and balance has improved becauseshe felt that she would have falled had it been a few weeks ago. No other significant changes since previous session.    Pertinent History  Patient previously seen for balance difficulties and shoulder pain and the right.  Patient reports no falls in the past six months. Patient reports pain has not improved for the past month and has been about the same since the onset of pain.     Limitations  Walking    Patient Stated Goals  To improve balance when  walking     Currently in Pain?  No/denies       TREATMENT Therapeutic Exercises Pelvic tilts x20 LTRs x20 LE marches with focus on incr distance of heel taps 3x10 Red ball overhead reaches 3x10  Palof press 6# 2x10 B Seated pelvic tilt on PB 2x10 LE marches on PB 2x20 with no UE support Hip hiking from 3" step (verbal cueing required, incr difficulty) x10 B Standing hip extension RTB 2x10 B Standing hip abduction YTB 2x10 B TRX squats 2x10  Pt reports no increase in pain throughout therapeutic exercises. Pt fatigued at end of treatment session.          PT Education - 02/28/18 1244    Education provided  Yes    Education Details  Patient educated on proper form and technique for therapeutic exercises.    Person(s) Educated  Patient    Methods   Explanation;Demonstration    Comprehension  Verbalized understanding;Returned demonstration          PT Long Term Goals - 02/14/18 1737      PT LONG TERM GOAL #1   Title  Patient will be independent with HEP to continue benefits of therapy after discharge.     Baseline  Dependent with form and technique for balancing exercises; moderate cueing for balance and strengthening; 02/14/2018: Independent with HEP     Time  4    Period  Weeks    Status  Achieved      PT LONG TERM GOAL #2   Title  Patient will improve FGA to over 26/30 to indicate functional improvement in strength and balance limitations and decrease fall risk    Baseline  22/30; ; 09/01/2017: 24/30; 09/30/2017: 25/30; 11/03/2017: 25/30; 02/14/2018: deferred to next visit    Time  8    Period  Weeks    Status  On-going      PT LONG TERM GOAL #3   Title  Patient will improve 5xSTS to under 16 seconds without use of a pillow to indicate functional improvement of LE function and decreased fall risk    Baseline  19 sec with pillow under chair; 09/01/2017 12 seconds with use of a pillow; 09/30/2017: 10.9 sec with use of pillow; defered today secondary to increased soreness from walking the previous day    Time  8    Period  Weeks    Status  Partially Met      PT LONG TERM GOAL #4   Title  Patient will be able to balance for 10sec with SLS to improve static balance and ability to get dressed when in standing    Baseline  1 sec; 09/01/2017: 3 sec each leg; 09/30/17: 4.5; 11/03/2017: 5 sec; 02/14/2018: Deffered to next visit    Time  8    Period  Weeks    Status  On-going      PT LONG TERM GOAL #5   Title  Patient will have a worst lumbar pain of a 3/10 over the past week to indicate functional improvement with performing standing activities and prolonged sitting.    Baseline  Worst pain: 8/10; 02/14/2018: worst pain: 7/10    Time  6    Period  Weeks    Status  On-going      PT LONG TERM GOAL #6   Title  Patient will be able to sit  for >1 hour to improve abiltity to drive without increase in pain.    Baseline  Increased pain after  driving for <41CVU; 08/19/4386: Patient reports increase in pain for 51mn    Time  6    Period  Weeks    Status  On-going            Plan - 02/28/18 1253    Clinical Impression Statement  Pt continues to demonstrate increase tolerance to higher repetitions and increased difficulty of therapeutic exercises. Pt is able to complete sit to stands with weight held out in front with no knee valgus after minimal verbal cueing. Pt demonstrates glute med weakness indicated by incr difficulty and quick fatigue with hip hiking exercise. Pt will continue to benefit from skilled PT in order to return to prior level of function.    Rehab Potential  Good    Clinical Impairments Affecting Rehab Potential  Positive: motivation; Negative: , age    PT Frequency  2x / week    PT Duration  6 weeks    PT Treatment/Interventions  ADLs/Self Care Home Management;Aquatic Therapy;Biofeedback;DME Instruction;Gait training;Stair training;Functional mobility training;Therapeutic activities;Therapeutic exercise;Balance training;Neuromuscular re-education;Patient/family education;Passive range of motion;Energy conservation;Ultrasound;Moist Heat;Iontophoresis 452mml Dexamethasone;Cryotherapy;Electrical Stimulation;Manual techniques;Dry needling    PT Next Visit Plan  bird dog, hip hiking, lateral band walk    PT Home Exercise Plan  see education section    Consulted and Agree with Plan of Care  Patient       Patient will benefit from skilled therapeutic intervention in order to improve the following deficits and impairments:  Pain, Increased fascial restricitons, Decreased coordination, Decreased mobility, Decreased activity tolerance, Decreased endurance, Decreased range of motion, Decreased strength, Hypomobility, Postural dysfunction, Abnormal gait, Decreased balance, Difficulty walking  Visit Diagnosis: Muscle  weakness (generalized)  Acute bilateral low back pain without sciatica  History of falling  Unsteadiness on feet     Problem List Patient Active Problem List   Diagnosis Date Noted  . Chronic bilateral low back pain without sciatica 12/31/2017  . Hypomagnesemia 09/29/2017  . Hypokalemia 09/29/2017  . Atrial fibrillation (HCHendrum02/22/2019  . Trapezius strain, right, initial encounter 09/10/2017  . Depression, major, single episode, mild (HC[facility]02/22/2019  . Chronic venous insufficiency 09/09/2017  . Lymphedema 09/09/2017  . Pain of toe of left foot 08/17/2017  . Grief 08/17/2017  . Epistaxis 07/31/2017  . Leg swelling 07/31/2017  . Dry eyes 07/06/2017  . Pain and swelling of lower extremity, right 03/29/2017  . Right shoulder pain 03/29/2017  . Diarrhea 05/18/2016  . Fall 05/01/2016  . Unintentional weight loss 12/30/2015  . Leg weakness, bilateral 12/30/2015  . Hyperkalemia 01/01/2015  . Occasional numbness/prickling/tingling of fingers and toes 12/18/2014  . Routine general medical examination at a health care facility 12/13/2013  . Macrocytic anemia 05/19/2013  . Psoriatic arthritis ([facility]CDover Plains07/18/2014  . Psoriasis   . Osteoarthritis, multiple sites   . Episodic mood disorder (HCGering05/14/2013  . History of breast cancer 10/28/2011  . GERD (gastroesophageal reflux disease)   . Hyperlipidemia   . Hypertension   . Osteopenia   . Carotid stenosis 05/27/2011   This entire session was performed under direct supervision and direction of a licensed therapist/therapist assistant . I have personally read, edited and approve of the note as written.   KaGeorg RuddlePT JaLyndel Safeuprich PT, DPT, GCS  Huprich,Jason 02/28/2018, 4:51 PM  [facility] [address]one: [phone] Fax:  [phone]: ChAANIYA STERBARN: [id number] of Birth: 8/25-Dec-1940

## 2018-03-02 ENCOUNTER — Ambulatory Visit: Payer: Medicare Other

## 2018-03-02 DIAGNOSIS — M6281 Muscle weakness (generalized): Secondary | ICD-10-CM | POA: Diagnosis not present

## 2018-03-02 DIAGNOSIS — M545 Low back pain, unspecified: Secondary | ICD-10-CM

## 2018-03-02 DIAGNOSIS — Z9181 History of falling: Secondary | ICD-10-CM

## 2018-03-02 DIAGNOSIS — R2681 Unsteadiness on feet: Secondary | ICD-10-CM

## 2018-03-02 NOTE — Therapy (Signed)
This entire session was performed under the direct supervision of a liscensed physical therapist.  5:26 PM, 03/02/18 Etta Grandchild, PT, DPT Physical Therapist - [place] [phone] (Office)       [facility] [address] [place], [address] Phone: [phone]   Fax:  [phone]  Physical Therapy Treatment  Patient Details  Name: MARIANNE GOLIGHTLY MRN: [id number] Date of Birth: 28-May-1941 Referring Provider: Caryl Bis MD   Encounter Date: 03/02/2018  PT End of Session - 03/02/18 1056    Visit Number  15    Number of Visits  21    Date for PT Re-Evaluation  03/14/18    Authorization Type  5/10    PT Start Time  1045    PT Stop Time  1115    PT Time Calculation (min)  30 min       Past Medical History:  Diagnosis Date  . (HFpEF) heart failure with preserved ejection fraction ([place])    a. 08/2017 Echo: EF 55-60%, no rwma, mild MR, mildly dil LA, nl RV fxn.  . Breast cancer ([place]) 2001   left breast  . Cancer ([place]) 2001   left breast ca  . Carotid arterial disease ([place])    a. 10/2004 s/p L CEA; b. 12/2015 Carotid U/S: RICA 1-39%; b. LICA patent CEA site.  Marland Kitchen GERD (gastroesophageal reflux disease)   . Hyperlipidemia   . Hypertension   . Osteoarthritis, multiple sites   . Osteopenia   . Persistent atrial fibrillation ([place])    a. Dx 08/2017; b. CHA2DS2VASc = 6-->Xarelto; c. 09/2017 Successful DCCV (second shock - 200J);   Marland Kitchen Personal history of radiation therapy 2001   left breast ca  . Psoriasis     Past Surgical History:  Procedure Laterality Date  . ABDOMINAL HYSTERECTOMY    . ANKLE FRACTURE SURGERY  4/08   left---hardware still in place  . 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 BREAST EXCISIONAL BIOPSY Left yrs ago   benign  . BREAST LUMPECTOMY Left 2001   f/u radiation  . CARDIOVERSION N/A 10/04/2017   Procedure: CARDIOVERSION;  Surgeon: Wellington Hampshire, MD;  Location: [facility]  ORS;  Service: Cardiovascular;  Laterality: N/A;  . CARDIOVERSION N/A 12/16/2017   Procedure: CARDIOVERSION;  Surgeon: [name], MD;  Location: [facility] ORS;  Service: Cardiovascular;  Laterality: N/A;  . CAROTID ENDARTERECTOMY Left 10/23/2004  . FRACTURE SURGERY    . OOPHORECTOMY    . SHOULDER SURGERY  6/07   left  . TONSILLECTOMY AND ADENOIDECTOMY    . TOTAL HIP ARTHROPLASTY  2004   right    There were no vitals filed for this visit.  Subjective Assessment - 03/02/18 1052    Subjective  Pt reports that she compelted HEP this morning and that back has not been feeling great today. No significant updates since previou session.    Pertinent History  Patient previously seen for balance difficulties and shoulder pain and the right.  Patient reports no falls in the past six months. Patient reports pain has not improved for the past month and has been about the same since the onset of pain.     Limitations  Walking    Patient Stated Goals  To improve balance when walking     Currently in Pain?  Yes    Pain Score  1     Pain Location  Back    Pain Orientation  Left;Lower;Right    Pain Descriptors / Indicators  Aching    Pain Type  Acute pain;Chronic pain    Pain Onset  More than a month ago        TREATMENT Therapeutic Exercises Pelvic tilts x20 LTRs x20 LE marches with focus on incr distance of heel taps 3x10 Palof press 6# 2x15 B Seated pelvic tilt on PB x20 (no cueing needed) Standing hip extension RTB 2x10 B Standing hip abduction RTB 2x10 B TRX squats 2x10  Pt reports no increase in pain throughout therapeutic exercises. Pt fatigued at end of treatment session.         PT Education - 03/02/18 1054    Education provided  Yes    Education Details  Patient educated on proper form and technique for therapeutic exercises.    Person(s) Educated  Patient    Methods  Explanation;Demonstration    Comprehension  Verbalized understanding;Returned demonstration           PT Long Term Goals - 02/14/18 1737      PT LONG TERM GOAL #1   Title  Patient will be independent with HEP to continue benefits of therapy after discharge.     Baseline  Dependent with form and technique for balancing exercises; moderate cueing for balance and strengthening; 02/14/2018: Independent with HEP     Time  4    Period  Weeks    Status  Achieved      PT LONG TERM GOAL #2   Title  Patient will improve FGA to over 26/30 to indicate functional improvement in strength and balance limitations and decrease fall risk    Baseline  22/30; ; 09/01/2017: 24/30; 09/30/2017: 25/30; 11/03/2017: 25/30; 02/14/2018: deferred to next visit    Time  8    Period  Weeks    Status  On-going      PT LONG TERM GOAL #3   Title  Patient will improve 5xSTS to under 16 seconds without use of a pillow to indicate functional improvement of LE function and decreased fall risk    Baseline  19 sec with pillow under chair; 09/01/2017 12 seconds with use of a pillow; 09/30/2017: 10.9 sec with use of pillow; defered today secondary to increased soreness from walking the previous day    Time  8    Period  Weeks    Status  Partially Met      PT LONG TERM GOAL #4   Title  Patient will be able to balance for 10sec with SLS to improve static balance and ability to get dressed when in standing    Baseline  1 sec; 09/01/2017: 3 sec each leg; 09/30/17: 4.5; 11/03/2017: 5 sec; 02/14/2018: Deffered to next visit    Time  8    Period  Weeks    Status  On-going      PT LONG TERM GOAL #5   Title  Patient will have a worst lumbar pain of a 3/10 over the past week to indicate functional improvement with performing standing activities and prolonged sitting.    Baseline  Worst pain: 8/10; 02/14/2018: worst pain: 7/10    Time  6    Period  Weeks    Status  On-going      PT LONG TERM GOAL #6   Title  Patient will be able to sit for >1 hour to improve abiltity to drive without increase in pain.    Baseline  Increased pain  after driving for <75TZG; 0/17/4944:  Patient reports increase in pain for 44mn    Time  6    Period  Weeks    Status  On-going            Plan - 03/02/18 1456    Clinical Impression Statement  Patient required no verbal or tactile cueing to properly perform seated anterior/posterior pelvic tilt. Pt also performed sit to stand and TRX squats with no knee valgus. Pt was able to tolerate increased resistance with hip abduction exercise while still maintining proper form.  Pt will benefit from continued skilled PT in order to return to prior level of function.    Rehab Potential  Good    Clinical Impairments Affecting Rehab Potential  Positive: motivation; Negative: , age    PT Frequency  2x / week    PT Duration  6 weeks    PT Treatment/Interventions  ADLs/Self Care Home Management;Aquatic Therapy;Biofeedback;DME Instruction;Gait training;Stair training;Functional mobility training;Therapeutic activities;Therapeutic exercise;Balance training;Neuromuscular re-education;Patient/family education;Passive range of motion;Energy conservation;Ultrasound;Moist Heat;Iontophoresis 443mml Dexamethasone;Cryotherapy;Electrical Stimulation;Manual techniques;Dry needling    PT Next Visit Plan  lateral band walk, bird dog UE only     PT Home Exercise Plan  see education section    Consulted and Agree with Plan of Care  Patient       Patient will benefit from skilled therapeutic intervention in order to improve the following deficits and impairments:  Pain, Increased fascial restricitons, Decreased coordination, Decreased mobility, Decreased activity tolerance, Decreased endurance, Decreased range of motion, Decreased strength, Hypomobility, Postural dysfunction, Abnormal gait, Decreased balance, Difficulty walking  Visit Diagnosis: Muscle weakness (generalized)  Acute bilateral low back pain without sciatica  History of falling  Unsteadiness on feet     Problem List Patient Active Problem List    Diagnosis Date Noted  . Chronic bilateral low back pain without sciatica 12/31/2017  . Hypomagnesemia 09/29/2017  . Hypokalemia 09/29/2017  . Atrial fibrillation (HCOat[place]02/22/2019  . Trapezius strain, right, initial encounter 09/10/2017  . Depression, major, single episode, mild (HCAl[facility]02/22/2019  . Chronic venous insufficiency 09/09/2017  . Lymphedema 09/09/2017  . Pain of toe of left foot 08/17/2017  . Grief 08/17/2017  . Epistaxis 07/31/2017  . Leg swelling 07/31/2017  . Dry eyes 07/06/2017  . Pain and swelling of lower extremity, right 03/29/2017  . Right shoulder pain 03/29/2017  . Diarrhea 05/18/2016  . Fall 05/01/2016  . Unintentional weight loss 12/30/2015  . Leg weakness, bilateral 12/30/2015  . Hyperkalemia 01/01/2015  . Occasional numbness/prickling/tingling of fingers and toes 12/18/2014  . Routine general medical examination at a health care facility 12/13/2013  . Macrocytic anemia 05/19/2013  . Psoriatic arthritis (HCBrown[facility]07/18/2014  . Psoriasis   . Osteoarthritis, multiple sites   . Episodic mood disorder (HCLam[place]05/14/2013  . History of breast cancer 10/28/2011  . GERD (gastroesophageal reflux disease)   . Hyperlipidemia   . Hypertension   . Osteopenia   . Carotid stenosis 05/27/2011    KaGeorg RuddleSPT 03/02/2018, 3:15 PM  [facility] [id number]: [phone] Fax:  [phone]: ChMUNACHIMSO RIGDONRN: [id number] of Birth: 8/Sep 27, 1940

## 2018-03-07 ENCOUNTER — Ambulatory Visit: Payer: Medicare Other

## 2018-03-07 DIAGNOSIS — M6281 Muscle weakness (generalized): Secondary | ICD-10-CM

## 2018-03-07 DIAGNOSIS — M545 Low back pain, unspecified: Secondary | ICD-10-CM

## 2018-03-07 DIAGNOSIS — R2681 Unsteadiness on feet: Secondary | ICD-10-CM

## 2018-03-07 DIAGNOSIS — Z9181 History of falling: Secondary | ICD-10-CM

## 2018-03-07 NOTE — Therapy (Cosign Needed)
[facility] [address] [place], [address] Phone: [phone]   Fax:  [phone]  Physical Therapy Treatment  Patient Details  Name: [name] MRN: [id number] Date of Birth: [birth year] Referring Provider: Caryl Bis MD   Encounter Date: 03/07/2018  PT End of Session - 03/07/18 1402    Visit Number  16    Number of Visits  21    Date for PT Re-Evaluation  03/14/18    Authorization Type  6/10    PT Start Time  1350    PT Stop Time  1430    PT Time Calculation (min)  40 min    Activity Tolerance  Patient tolerated treatment well    Behavior During Therapy  [facility] for tasks assessed/performed       Past Medical History:  Diagnosis Date  . (HFpEF) heart failure with preserved ejection fraction ([place])    a. 08/2017 Echo: EF 55-60%, no rwma, mild MR, mildly dil LA, nl RV fxn.  . Breast cancer ([place]) 2001   left breast  . Cancer ([place]) 2001   left breast ca  . Carotid arterial disease ([place])    a. 10/2004 s/p L CEA; b. 12/2015 Carotid U/S: RICA 1-39%; b. LICA patent CEA site.  Marland Kitchen GERD (gastroesophageal reflux disease)   . Hyperlipidemia   . Hypertension   . Osteoarthritis, multiple sites   . Osteopenia   . Persistent atrial fibrillation ([place])    a. Dx 08/2017; b. CHA2DS2VASc = 6-->Xarelto; c. 09/2017 Successful DCCV (second shock - 200J);   Marland Kitchen Personal history of radiation therapy 2001   left breast ca  . Psoriasis     Past Surgical History:  Procedure Laterality Date  . ABDOMINAL HYSTERECTOMY    . ANKLE FRACTURE SURGERY  4/08   left---hardware still in place  . 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 BREAST EXCISIONAL BIOPSY Left yrs ago   benign  . BREAST LUMPECTOMY Left 2001   f/u radiation  . CARDIOVERSION N/A 10/04/2017   Procedure: CARDIOVERSION;  Surgeon: Wellington Hampshire, MD;  Location: [facility] ORS;  Service: Cardiovascular;  Laterality: N/A;  . CARDIOVERSION N/A 12/16/2017   Procedure:  CARDIOVERSION;  Surgeon: [name], MD;  Location: [facility] ORS;  Service: Cardiovascular;  Laterality: N/A;  . CAROTID ENDARTERECTOMY Left 10/23/2004  . FRACTURE SURGERY    . OOPHORECTOMY    . SHOULDER SURGERY  6/07   left  . TONSILLECTOMY AND ADENOIDECTOMY    . TOTAL HIP ARTHROPLASTY  2004   right    There were no vitals filed for this visit.  Subjective Assessment - 03/07/18 1351    Subjective  Pt reports that she has been completing HEP and that it is providing relief. Pt also states that she has been walking daily with no pain.    Pertinent History  Patient previously seen for balance difficulties and shoulder pain and the right.  Patient reports no falls in the past six months. Patient reports pain has not improved for the past month and has been about the same since the onset of pain.     Limitations  Walking    Patient Stated Goals  To improve balance when walking     Currently in Pain?  No/denies    Pain Score  0-No pain       TREATMENT Therapeutic Exercises Pelvic tilts x20 LTRs x20 LE marches with focus on incr distance of heel taps  3x10 BLE bridges 2x10 Bird dogs LE only 3x5 Palof press 6# 2x15 B Sanding hip ext 2x15 GTB Lateral stepping with RTB around ankles x6  Marches on physio ball with no UE support 2x20 Pelvic tilts on physio ball 2x15 TRX squats 2x15  Pt demonstrates increased fatigue at end of treatment session. No increase in pain throughout therapeutic exercises.       PT Education - 03/07/18 1400    Education provided  Yes    Education Details  Patient educated on proper form and technique for therapeutic exercises.     Person(s) Educated  Patient    Methods  Explanation;Demonstration    Comprehension  Verbalized understanding;Returned demonstration          PT Long Term Goals - 02/14/18 1737      PT LONG TERM GOAL #1   Title  Patient will be independent with HEP to continue benefits of therapy after discharge.     Baseline   Dependent with form and technique for balancing exercises; moderate cueing for balance and strengthening; 02/14/2018: Independent with HEP     Time  4    Period  Weeks    Status  Achieved      PT LONG TERM GOAL #2   Title  Patient will improve FGA to over 26/30 to indicate functional improvement in strength and balance limitations and decrease fall risk    Baseline  22/30; ; 09/01/2017: 24/30; 09/30/2017: 25/30; 11/03/2017: 25/30; 02/14/2018: deferred to next visit    Time  8    Period  Weeks    Status  On-going      PT LONG TERM GOAL #3   Title  Patient will improve 5xSTS to under 16 seconds without use of a pillow to indicate functional improvement of LE function and decreased fall risk    Baseline  19 sec with pillow under chair; 09/01/2017 12 seconds with use of a pillow; 09/30/2017: 10.9 sec with use of pillow; defered today secondary to increased soreness from walking the previous day    Time  8    Period  Weeks    Status  Partially Met      PT LONG TERM GOAL #4   Title  Patient will be able to balance for 10sec with SLS to improve static balance and ability to get dressed when in standing    Baseline  1 sec; 09/01/2017: 3 sec each leg; 09/30/17: 4.5; 11/03/2017: 5 sec; 02/14/2018: Deffered to next visit    Time  8    Period  Weeks    Status  On-going      PT LONG TERM GOAL #5   Title  Patient will have a worst lumbar pain of a 3/10 over the past week to indicate functional improvement with performing standing activities and prolonged sitting.    Baseline  Worst pain: 8/10; 02/14/2018: worst pain: 7/10    Time  6    Period  Weeks    Status  On-going      PT LONG TERM GOAL #6   Title  Patient will be able to sit for >1 hour to improve abiltity to drive without increase in pain.    Baseline  Increased pain after driving for <81XBJ; 4/78/2956: Patient reports increase in pain for 42mn    Time  6    Period  Weeks    Status  On-going            Plan - 03/07/18 1709  Clinical  Impression Statement  Patient demonstrates improved form with bird dog exercise and was able to tolerate increased number of reps. Pt also demonstrates improved ability to perform LE marcheson physio ball with no UE support. Pt continues to demonstrates decr pelvic motor control indicated by verbal cueing required for proper seated pelvic tilts. Pt will continue to benefit from skilled PT in order to return to prior level of function.     Rehab Potential  Good    Clinical Impairments Affecting Rehab Potential  Positive: motivation; Negative: , age    PT Frequency  2x / week    PT Duration  6 weeks    PT Treatment/Interventions  ADLs/Self Care Home Management;Aquatic Therapy;Biofeedback;DME Instruction;Gait training;Stair training;Functional mobility training;Therapeutic activities;Therapeutic exercise;Balance training;Neuromuscular re-education;Patient/family education;Passive range of motion;Energy conservation;Ultrasound;Moist Heat;Iontophoresis 32m/ml Dexamethasone;Cryotherapy;Electrical Stimulation;Manual techniques;Dry needling    PT Next Visit Plan  lateral band walk, bird dog UE only     PT Home Exercise Plan  see education section    Consulted and Agree with Plan of Care  Patient       Patient will benefit from skilled therapeutic intervention in order to improve the following deficits and impairments:  Pain, Increased fascial restricitons, Decreased coordination, Decreased mobility, Decreased activity tolerance, Decreased endurance, Decreased range of motion, Decreased strength, Hypomobility, Postural dysfunction, Abnormal gait, Decreased balance, Difficulty walking  Visit Diagnosis: Muscle weakness (generalized)  Acute bilateral low back pain without sciatica  History of falling  Unsteadiness on feet     Problem List Patient Active Problem List   Diagnosis Date Noted  . Chronic bilateral low back pain without sciatica 12/31/2017  . Hypomagnesemia 09/29/2017  . Hypokalemia  09/29/2017  . Atrial fibrillation ([place] 09/10/2017  . Trapezius strain, right, initial encounter 09/10/2017  . Depression, major, single episode, mild ([place] 09/10/2017  . Chronic venous insufficiency 09/09/2017  . Lymphedema 09/09/2017  . Pain of toe of left foot 08/17/2017  . Grief 08/17/2017  . Epistaxis 07/31/2017  . Leg swelling 07/31/2017  . Dry eyes 07/06/2017  . Pain and swelling of lower extremity, right 03/29/2017  . Right shoulder pain 03/29/2017  . Diarrhea 05/18/2016  . Fall 05/01/2016  . Unintentional weight loss 12/30/2015  . Leg weakness, bilateral 12/30/2015  . Hyperkalemia 01/01/2015  . Occasional numbness/prickling/tingling of fingers and toes 12/18/2014  . Routine general medical examination at a health care facility 12/13/2013  . Macrocytic anemia 05/19/2013  . Psoriatic arthritis ([place] 02/03/2013  . Psoriasis   . Osteoarthritis, multiple sites   . Episodic mood disorder ([place] 12/01/2011  . History of breast cancer 10/28/2011  . GERD (gastroesophageal reflux disease)   . Hyperlipidemia   . Hypertension   . Osteopenia   . Carotid stenosis 05/27/2011    This entire session was performed under direct supervision and direction of a licensed therapist/therapist assistant . I have personally read, edited and approve of the note as written.   KGeorg RuddleSPT JLyndel SafeHuprich PT, DPT, GCS  Huprich,Jason 03/08/2018, 9:20 AM  [facility] [address] [place] [address]Phone: [phone]  Fax:  [phone] Name: CSANDRALEE TARKINGTONMRN: [id number] of Birth: 8Jun 11, 1942

## 2018-03-09 ENCOUNTER — Ambulatory Visit: Payer: Medicare Other

## 2018-03-09 DIAGNOSIS — R2681 Unsteadiness on feet: Secondary | ICD-10-CM | POA: Diagnosis not present

## 2018-03-09 DIAGNOSIS — M545 Low back pain, unspecified: Secondary | ICD-10-CM

## 2018-03-09 DIAGNOSIS — Z9181 History of falling: Secondary | ICD-10-CM | POA: Diagnosis not present

## 2018-03-09 DIAGNOSIS — M6281 Muscle weakness (generalized): Secondary | ICD-10-CM

## 2018-03-09 NOTE — Therapy (Signed)
[facility] [address], [place], [address] Phone: [phone]   Fax:  [phone]  Physical Therapy Treatment  Patient Details  Name: [name] MRN: [id number] Date of Birth: May 20, 1941 Referring Provider: Caryl Bis MD   Encounter Date: 03/09/2018  PT End of Session - 03/09/18 1249    Visit Number  17    Number of Visits  21    Date for PT Re-Evaluation  03/14/18    Authorization Type  7/10    PT Start Time  1000    PT Stop Time  1045    PT Time Calculation (min)  45 min    Activity Tolerance  Patient tolerated treatment well    Behavior During Therapy  [facility] for tasks assessed/performed       Past Medical History:  Diagnosis Date  . (HFpEF) heart failure with preserved ejection fraction ([place])    a. 08/2017 Echo: EF 55-60%, no rwma, mild MR, mildly dil LA, nl RV fxn.  . Breast cancer ([place]) 2001   left breast  . Cancer ([place]) 2001   left breast ca  . Carotid arterial disease ([place])    a. 10/2004 s/p L CEA; b. 12/2015 Carotid U/S: RICA 1-39%; b. LICA patent CEA site.  Marland Kitchen GERD (gastroesophageal reflux disease)   . Hyperlipidemia   . Hypertension   . Osteoarthritis, multiple sites   . Osteopenia   . Persistent atrial fibrillation ([place])    a. Dx 08/2017; b. CHA2DS2VASc = 6-->Xarelto; c. 09/2017 Successful DCCV (second shock - 200J);   Marland Kitchen Personal history of radiation therapy 2001   left breast ca  . Psoriasis     Past Surgical History:  Procedure Laterality Date  . ABDOMINAL HYSTERECTOMY    . ANKLE FRACTURE SURGERY  4/08   left---hardware still in place  . 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 BREAST EXCISIONAL BIOPSY Left yrs ago   benign  . BREAST LUMPECTOMY Left 2001   f/u radiation  . CARDIOVERSION N/A 10/04/2017   Procedure: CARDIOVERSION;  Surgeon: Wellington Hampshire, MD;  Location: [facility] ORS;  Service: Cardiovascular;  Laterality: N/A;  . CARDIOVERSION N/A 12/16/2017   Procedure:  CARDIOVERSION;  Surgeon: [name], MD;  Location: [facility] ORS;  Service: Cardiovascular;  Laterality: N/A;  . CAROTID ENDARTERECTOMY Left 10/23/2004  . FRACTURE SURGERY    . OOPHORECTOMY    . SHOULDER SURGERY  6/07   left  . TONSILLECTOMY AND ADENOIDECTOMY    . TOTAL HIP ARTHROPLASTY  2004   right    There were no vitals filed for this visit.  Subjective Assessment - 03/09/18 1246    Subjective  Pt reports that back has been bothering her today and that she did not have a chance to compelte HEP this morning before walking her dog. No other significant updates since previous session.    Pertinent History  Patient previously seen for balance difficulties and shoulder pain and the right.  Patient reports no falls in the past six months. Patient reports pain has not improved for the past month and has been about the same since the onset of pain.     Limitations  Walking    Patient Stated Goals  To improve balance when walking     Currently in Pain?  Yes    Pain Score  2     Pain Location  Back    Pain Orientation  Left;Right;Lower  Pain Descriptors / Indicators  Aching    Pain Type  Acute pain;Chronic pain    Pain Onset  More than a month ago       TREATMENT Therapeutic Exercises Pelvic tilts x20 LTRs x20 LE marches with focus on incr distance of heel taps 3x10 BLE bridges 2x10 (incr height achieved today) Bird dogs LE only 2x10 Sanding hip ext 2x15 RTB Lateral stepping with RTB around ankles x6 with no UE support Marches on physio ball with no UE support 2x20 Pelvic tilts on physio ball 2x15 Heel raises on airex beam 2x15 TRX squats 2x15  Pt demonstrates fatigue at end of treatment session.     PT Education - 03/09/18 1249    Education provided  Yes    Education Details  Patient educated on proper form and technique for therapeutic exercises.    Person(s) Educated  Patient    Methods  Explanation;Demonstration    Comprehension  Verbalized understanding;Returned  demonstration          PT Long Term Goals - 02/14/18 1737      PT LONG TERM GOAL #1   Title  Patient will be independent with HEP to continue benefits of therapy after discharge.     Baseline  Dependent with form and technique for balancing exercises; moderate cueing for balance and strengthening; 02/14/2018: Independent with HEP     Time  4    Period  Weeks    Status  Achieved      PT LONG TERM GOAL #2   Title  Patient will improve FGA to over 26/30 to indicate functional improvement in strength and balance limitations and decrease fall risk    Baseline  22/30; ; 09/01/2017: 24/30; 09/30/2017: 25/30; 11/03/2017: 25/30; 02/14/2018: deferred to next visit    Time  8    Period  Weeks    Status  On-going      PT LONG TERM GOAL #3   Title  Patient will improve 5xSTS to under 16 seconds without use of a pillow to indicate functional improvement of LE function and decreased fall risk    Baseline  19 sec with pillow under chair; 09/01/2017 12 seconds with use of a pillow; 09/30/2017: 10.9 sec with use of pillow; defered today secondary to increased soreness from walking the previous day    Time  8    Period  Weeks    Status  Partially Met      PT LONG TERM GOAL #4   Title  Patient will be able to balance for 10sec with SLS to improve static balance and ability to get dressed when in standing    Baseline  1 sec; 09/01/2017: 3 sec each leg; 09/30/17: 4.5; 11/03/2017: 5 sec; 02/14/2018: Deffered to next visit    Time  8    Period  Weeks    Status  On-going      PT LONG TERM GOAL #5   Title  Patient will have a worst lumbar pain of a 3/10 over the past week to indicate functional improvement with performing standing activities and prolonged sitting.    Baseline  Worst pain: 8/10; 02/14/2018: worst pain: 7/10    Time  6    Period  Weeks    Status  On-going      PT LONG TERM GOAL #6   Title  Patient will be able to sit for >1 hour to improve abiltity to drive without increase in pain.     Baseline  Increased  pain after driving for <84TXM; 4/68/0321: Patient reports increase in pain for 98mn    Time  6    Period  Weeks    Status  On-going            Plan - 03/09/18 1250    Clinical Impression Statement  Pt dmeonstrates improved ability to perform bird dog exercise with decreased lateral trunk movement and decreased cueing required for proper form. Pt also demonstrates improved ability to perform chop exercise and is able to tolerate increased repeitions. Pt conitnues to demonstrate difficulty performing seated pelivc tilts and requires verbal and tactile cueing. Pt will benefit from continued skilled PT in order to return to prior level of function.    Rehab Potential  Good    Clinical Impairments Affecting Rehab Potential  Positive: motivation; Negative: , age    PT Frequency  2x / week    PT Duration  6 weeks    PT Treatment/Interventions  ADLs/Self Care Home Management;Aquatic Therapy;Biofeedback;DME Instruction;Gait training;Stair training;Functional mobility training;Therapeutic activities;Therapeutic exercise;Balance training;Neuromuscular re-education;Patient/family education;Passive range of motion;Energy conservation;Ultrasound;Moist Heat;Iontophoresis 430mml Dexamethasone;Cryotherapy;Electrical Stimulation;Manual techniques;Dry needling    PT Next Visit Plan  progress core and back strengthening    PT Home Exercise Plan  see education section    Consulted and Agree with Plan of Care  Patient       Patient will benefit from skilled therapeutic intervention in order to improve the following deficits and impairments:  Pain, Increased fascial restricitons, Decreased coordination, Decreased mobility, Decreased activity tolerance, Decreased endurance, Decreased range of motion, Decreased strength, Hypomobility, Postural dysfunction, Abnormal gait, Decreased balance, Difficulty walking  Visit Diagnosis: Muscle weakness (generalized)  Acute bilateral low back pain  without sciatica  History of falling  Unsteadiness on feet     Problem List Patient Active Problem List   Diagnosis Date Noted  . Chronic bilateral low back pain without sciatica 12/31/2017  . Hypomagnesemia 09/29/2017  . Hypokalemia 09/29/2017  . Atrial fibrillation (HCDaggett02/22/2019  . Trapezius strain, right, initial encounter 09/10/2017  . Depression, major, single episode, mild (HCSp[place]02/22/2019  . Chronic venous insufficiency 09/09/2017  . Lymphedema 09/09/2017  . Pain of toe of left foot 08/17/2017  . Grief 08/17/2017  . Epistaxis 07/31/2017  . Leg swelling 07/31/2017  . Dry eyes 07/06/2017  . Pain and swelling of lower extremity, right 03/29/2017  . Right shoulder pain 03/29/2017  . Diarrhea 05/18/2016  . Fall 05/01/2016  . Unintentional weight loss 12/30/2015  . Leg weakness, bilateral 12/30/2015  . Hyperkalemia 01/01/2015  . Occasional numbness/prickling/tingling of fingers and toes 12/18/2014  . Routine general medical examination at a health care facility 12/13/2013  . Macrocytic anemia 05/19/2013  . Psoriatic arthritis (HCT[place]07/18/2014  . Psoriasis   . Osteoarthritis, multiple sites   . Episodic mood disorder (HCMonticello05/14/2013  . History of breast cancer 10/28/2011  . GERD (gastroesophageal reflux disease)   . Hyperlipidemia   . Hypertension   . Osteopenia   . Carotid stenosis 05/27/2011    KaGeorg RuddleSPT 03/09/2018, 12:55 PM  [facility] [id number]: [phone] Fax:  [phone]: [name]: [id number] of Birth: 02/1941/10/17

## 2018-03-14 ENCOUNTER — Ambulatory Visit: Payer: Medicare Other

## 2018-03-16 ENCOUNTER — Ambulatory Visit: Payer: Medicare Other

## 2018-03-16 DIAGNOSIS — R2681 Unsteadiness on feet: Secondary | ICD-10-CM | POA: Diagnosis not present

## 2018-03-16 DIAGNOSIS — M545 Low back pain, unspecified: Secondary | ICD-10-CM

## 2018-03-16 DIAGNOSIS — M6281 Muscle weakness (generalized): Secondary | ICD-10-CM

## 2018-03-16 DIAGNOSIS — Z9181 History of falling: Secondary | ICD-10-CM

## 2018-03-16 NOTE — Therapy (Signed)
[facility] [address] [place], [address] Phone: [phone]   Fax:  [phone]  Physical Therapy Treatment  Patient Details  Name: [name] MRN: [id number] Date of Birth: August 19, 1940 Referring Provider: Caryl Bis MD   Encounter Date: 03/16/2018  PT End of Session - 03/16/18 1047    Visit Number  18    Number of Visits  21    Date for PT Re-Evaluation  04/13/18    Authorization Type  10/10    PT Start Time  1005    PT Stop Time  1045    PT Time Calculation (min)  40 min    Activity Tolerance  Patient tolerated treatment well    Behavior During Therapy  [facility] for tasks assessed/performed       Past Medical History:  Diagnosis Date  . (HFpEF) heart failure with preserved ejection fraction ([place])    a. 08/2017 Echo: EF 55-60%, no rwma, mild MR, mildly dil LA, nl RV fxn.  . Breast cancer ([place]) 2001   left breast  . Cancer ([place]) 2001   left breast ca  . Carotid arterial disease ([place])    a. 10/2004 s/p L CEA; b. 12/2015 Carotid U/S: RICA 1-39%; b. LICA patent CEA site.  Marland Kitchen GERD (gastroesophageal reflux disease)   . Hyperlipidemia   . Hypertension   . Osteoarthritis, multiple sites   . Osteopenia   . Persistent atrial fibrillation ([place])    a. Dx 08/2017; b. CHA2DS2VASc = 6-->Xarelto; c. 09/2017 Successful DCCV (second shock - 200J);   Marland Kitchen Personal history of radiation therapy 2001   left breast ca  . Psoriasis     Past Surgical History:  Procedure Laterality Date  . ABDOMINAL HYSTERECTOMY    . ANKLE FRACTURE SURGERY  4/08   left---hardware still in place  . 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 BREAST EXCISIONAL BIOPSY Left yrs ago   benign  . BREAST LUMPECTOMY Left 2001   f/u radiation  . CARDIOVERSION N/A 10/04/2017   Procedure: CARDIOVERSION;  Surgeon: Wellington Hampshire, MD;  Location: [facility] ORS;  Service: Cardiovascular;  Laterality: N/A;  . CARDIOVERSION N/A 12/16/2017   Procedure:  CARDIOVERSION;  Surgeon: [name], MD;  Location: [facility] ORS;  Service: Cardiovascular;  Laterality: N/A;  . CAROTID ENDARTERECTOMY Left 10/23/2004  . FRACTURE SURGERY    . OOPHORECTOMY    . SHOULDER SURGERY  6/07   left  . TONSILLECTOMY AND ADENOIDECTOMY    . TOTAL HIP ARTHROPLASTY  2004   right    There were no vitals filed for this visit.  Subjective Assessment - 03/16/18 1022    Subjective  Patient reports increased pain and spasms along her upper lumbar/ lower thoracic. Patient reports no major changes otherwise.     Pertinent History  Patient previously seen for balance difficulties and shoulder pain and the right.  Patient reports no falls in the past six months. Patient reports pain has not improved for the past month and has been about the same since the onset of pain.     Limitations  Walking    Patient Stated Goals  To improve balance when walking     Currently in Pain?  Yes    Pain Score  2     Pain Location  Back    Pain Orientation  Left;Right;Lower    Pain Descriptors / Indicators  Aching    Pain Type  Acute pain  Pain Onset  More than a month ago    Pain Frequency  Intermittent        TREATMENT Therapeutic Exercises LTRs with feet on top of ball -- x20 (with arms overhead) LTRs in hooklying - x 20  Pelvic tilts in hooklying -- x30 SNAGS lumbar/thoracic extension in standing - x 10; in sitting - x 10  Single leg stance - 2 min B Thoracic scapular rows - x 20  Tandem amb - 6 x 10  EC ambulation - x 30f Backwards ambulation - x 384fStep ups onto 6" step - x 10 TRX squats -- x15    Pt demonstrates fatigue at end of treatment session.   PT Education - 03/16/18 1046    Education provided  Yes    Education Details  form/technique with exercise    Person(s) Educated  Patient    Methods  Explanation;Demonstration    Comprehension  Verbalized understanding;Returned demonstration          PT Long Term Goals - 03/16/18 1018      PT LONG TERM  GOAL #1   Title  Patient will be independent with HEP to continue benefits of therapy after discharge.     Baseline  Dependent with form and technique for balancing exercises; moderate cueing for balance and strengthening; 02/14/2018: Independent with HEP     Time  4    Period  Weeks    Status  Achieved      PT LONG TERM GOAL #2   Title  Patient will improve FGA to over 26/30 to indicate functional improvement in strength and balance limitations and decrease fall risk    Baseline  22/30; ; 09/01/2017: 24/30; 09/30/2017: 25/30; 11/03/2017: 25/30; 02/14/2018: deferred to next visit; 03/16/2018: 28/30    Time  8    Period  Weeks    Status  Achieved      PT LONG TERM GOAL #3   Title  Patient will improve 5xSTS to under 16 seconds without use of a pillow to indicate functional improvement of LE function and decreased fall risk    Baseline  19 sec with pillow under chair; 09/01/2017 12 seconds with use of a pillow; 09/30/2017: 10.9 sec with use of pillow; defered today secondary to increased soreness from walking the previous day    Time  8    Period  Weeks    Status  Partially Met      PT LONG TERM GOAL #4   Title  Patient will be able to balance for 10sec with SLS to improve static balance and ability to get dressed when in standing    Baseline  1 sec; 09/01/2017: 3 sec each leg; 09/30/17: 4.5; 11/03/2017: 5 sec; 02/14/2018: Deffered to next visit; 03/16/2018: 7sec     Time  8    Period  Weeks    Status  On-going      PT LONG TERM GOAL #5   Title  Patient will have a worst lumbar pain of a 3/10 over the past week to indicate functional improvement with performing standing activities and prolonged sitting.    Baseline  Worst pain: 8/10; 02/14/2018: worst pain: 7/10; 03/16/2018: 3/10    Time  6    Period  Weeks    Status  Achieved      PT LONG TERM GOAL #6   Title  Patient will be able to sit for >1 hour to improve abiltity to drive without increase in pain.  Baseline  Increased pain after driving  for <75FFM; 02/14/2018: Patient reports increase in pain for 79mn; 03/16/2018: 1 hour    Time  6    Period  Weeks    Status  Achieved            Plan - 03/16/18 1223    Clinical Impression Statement  Pt demonstrates improvement with FGA, single leg stance, and decreased maximum amount of pain. Patient demonstrates improvement with exercise espevcially with dynamic balance based exercises. Although patient is improving, she continues to have increased LBP most notably with changing positions. Patient will benefit from further skilled therapy to return to prior level of function.     Rehab Potential  Good    Clinical Impairments Affecting Rehab Potential  Positive: motivation; Negative: , age    PT Frequency  2x / week    PT Duration  6 weeks    PT Treatment/Interventions  ADLs/Self Care Home Management;Aquatic Therapy;Biofeedback;DME Instruction;Gait training;Stair training;Functional mobility training;Therapeutic activities;Therapeutic exercise;Balance training;Neuromuscular re-education;Patient/family education;Passive range of motion;Energy conservation;Ultrasound;Moist Heat;Iontophoresis 41mml Dexamethasone;Cryotherapy;Electrical Stimulation;Manual techniques;Dry needling    PT Next Visit Plan  progress core and back strengthening    PT Home Exercise Plan  see education section    Consulted and Agree with Plan of Care  Patient       Patient will benefit from skilled therapeutic intervention in order to improve the following deficits and impairments:  Pain, Increased fascial restricitons, Decreased coordination, Decreased mobility, Decreased activity tolerance, Decreased endurance, Decreased range of motion, Decreased strength, Hypomobility, Postural dysfunction, Abnormal gait, Decreased balance, Difficulty walking  Visit Diagnosis: Muscle weakness (generalized)  Acute bilateral low back pain without sciatica  History of falling     Problem List Patient Active Problem List    Diagnosis Date Noted  . Chronic bilateral low back pain without sciatica 12/31/2017  . Hypomagnesemia 09/29/2017  . Hypokalemia 09/29/2017  . Atrial fibrillation (HCSeven Hills02/22/2019  . Trapezius strain, right, initial encounter 09/10/2017  . Depression, major, single episode, mild (HCH[place]oke02/22/2019  . Chronic venous insufficiency 09/09/2017  . Lymphedema 09/09/2017  . Pain of toe of left foot 08/17/2017  . Grief 08/17/2017  . Epistaxis 07/31/2017  . Leg swelling 07/31/2017  . Dry eyes 07/06/2017  . Pain and swelling of lower extremity, right 03/29/2017  . Right shoulder pain 03/29/2017  . Diarrhea 05/18/2016  . Fall 05/01/2016  . Unintentional weight loss 12/30/2015  . Leg weakness, bilateral 12/30/2015  . Hyperkalemia 01/01/2015  . Occasional numbness/prickling/tingling of fingers and toes 12/18/2014  . Routine general medical examination at a health care facility 12/13/2013  . Macrocytic anemia 05/19/2013  . Psoriatic arthritis ([place]07/18/2014  . Psoriasis   . Osteoarthritis, multiple sites   . Episodic mood disorder (HC[place]05/14/2013  . History of breast cancer 10/28/2011  . GERD (gastroesophageal reflux disease)   . Hyperlipidemia   . Hypertension   . Osteopenia   . Carotid stenosis 05/27/2011    WeBlythe StanfordPT DPT 03/16/2018, 12:32 PM  [facility] [address]one: [phone] Fax:  [phone]: ChDEMA TIMMONSRN: [id number] of Birth: 02/24/00/1942

## 2018-03-16 NOTE — Addendum Note (Signed)
Addended by: Blain Pais on: 03/16/2018 02:26 PM   Modules accepted: Orders

## 2018-03-23 ENCOUNTER — Ambulatory Visit: Payer: Medicare Other | Attending: Family Medicine

## 2018-03-23 DIAGNOSIS — Z9181 History of falling: Secondary | ICD-10-CM | POA: Diagnosis not present

## 2018-03-23 DIAGNOSIS — M6281 Muscle weakness (generalized): Secondary | ICD-10-CM | POA: Insufficient documentation

## 2018-03-23 DIAGNOSIS — M545 Low back pain, unspecified: Secondary | ICD-10-CM

## 2018-03-23 NOTE — Therapy (Signed)
[facility] [address] [place], [address] Phone: [phone]   Fax:  [phone]  Physical Therapy Treatment  Patient Details  Name: [name] MRN: [id number] Date of Birth: 1940/12/14 Referring Provider: Caryl Bis MD   Encounter Date: 03/23/2018  PT End of Session - 03/23/18 1003    Visit Number  19    Number of Visits  29    Date for PT Re-Evaluation  04/13/18    Authorization Type  1/10    PT Start Time  0948    PT Stop Time  1030    PT Time Calculation (min)  42 min    Activity Tolerance  Patient tolerated treatment well    Behavior During Therapy  [facility] for tasks assessed/performed       Past Medical History:  Diagnosis Date  . (HFpEF) heart failure with preserved ejection fraction ([place])    a. 08/2017 Echo: EF 55-60%, no rwma, mild MR, mildly dil LA, nl RV fxn.  . Breast cancer ([place]) 2001   left breast  . Cancer ([place]) 2001   left breast ca  . Carotid arterial disease ([place])    a. 10/2004 s/p L CEA; b. 12/2015 Carotid U/S: RICA 1-39%; b. LICA patent CEA site.  Marland Kitchen GERD (gastroesophageal reflux disease)   . Hyperlipidemia   . Hypertension   . Osteoarthritis, multiple sites   . Osteopenia   . Persistent atrial fibrillation ([place])    a. Dx 08/2017; b. CHA2DS2VASc = 6-->Xarelto; c. 09/2017 Successful DCCV (second shock - 200J);   Marland Kitchen Personal history of radiation therapy 2001   left breast ca  . Psoriasis     Past Surgical History:  Procedure Laterality Date  . ABDOMINAL HYSTERECTOMY    . ANKLE FRACTURE SURGERY  4/08   left---hardware still in place  . 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 BREAST EXCISIONAL BIOPSY Left yrs ago   benign  . BREAST LUMPECTOMY Left 2001   f/u radiation  . CARDIOVERSION N/A 10/04/2017   Procedure: CARDIOVERSION;  Surgeon: Wellington Hampshire, MD;  Location: [facility] ORS;  Service: Cardiovascular;  Laterality: N/A;  . CARDIOVERSION N/A 12/16/2017   Procedure:  CARDIOVERSION;  Surgeon: [name], MD;  Location: [facility] ORS;  Service: Cardiovascular;  Laterality: N/A;  . CAROTID ENDARTERECTOMY Left 10/23/2004  . FRACTURE SURGERY    . OOPHORECTOMY    . SHOULDER SURGERY  6/07   left  . TONSILLECTOMY AND ADENOIDECTOMY    . TOTAL HIP ARTHROPLASTY  2004   right    There were no vitals filed for this visit.  Subjective Assessment - 03/23/18 1001    Subjective  Patient reports she continues to have increase pain with transferring from sitting to lying. Patient states she began to paint this weekend for the first time in 5 years.     Pertinent History  Patient previously seen for balance difficulties and shoulder pain and the right.  Patient reports no falls in the past six months. Patient reports pain has not improved for the past month and has been about the same since the onset of pain.     Limitations  Walking    Patient Stated Goals  To improve balance when walking     Currently in Pain?  No/denies    Pain Onset  More than a month ago       TREATMENT Therapeutic Exercises Pelvic tilts x20 LTRs x20 BLE bridges 2x10 (  incr height achieved today) Standing low rows 10# -- x 20  Standing paloff press  -- x 15 5# Hip abduction with RTB - 2 x 10 B TRX squats 2x15 Tandem Amb on airex beam - x 8 down and back Side stepping across airex beams - x 8 down and back   Pt demonstrates fatigue at end of treatment session.   PT Education - 03/23/18 1003    Education provided  Yes    Education Details  form/technique with exercise    Person(s) Educated  Patient    Methods  Explanation;Demonstration    Comprehension  Verbalized understanding;Returned demonstration          PT Long Term Goals - 03/16/18 1018      PT LONG TERM GOAL #1   Title  Patient will be independent with HEP to continue benefits of therapy after discharge.     Baseline  Dependent with form and technique for balancing exercises; moderate cueing for balance and strengthening;  02/14/2018: Independent with HEP     Time  4    Period  Weeks    Status  Achieved      PT LONG TERM GOAL #2   Title  Patient will improve FGA to over 26/30 to indicate functional improvement in strength and balance limitations and decrease fall risk    Baseline  22/30; ; 09/01/2017: 24/30; 09/30/2017: 25/30; 11/03/2017: 25/30; 02/14/2018: deferred to next visit; 03/16/2018: 28/30    Time  8    Period  Weeks    Status  Achieved      PT LONG TERM GOAL #3   Title  Patient will improve 5xSTS to under 16 seconds without use of a pillow to indicate functional improvement of LE function and decreased fall risk    Baseline  19 sec with pillow under chair; 09/01/2017 12 seconds with use of a pillow; 09/30/2017: 10.9 sec with use of pillow; defered today secondary to increased soreness from walking the previous day    Time  8    Period  Weeks    Status  Partially Met      PT LONG TERM GOAL #4   Title  Patient will be able to balance for 10sec with SLS to improve static balance and ability to get dressed when in standing    Baseline  1 sec; 09/01/2017: 3 sec each leg; 09/30/17: 4.5; 11/03/2017: 5 sec; 02/14/2018: Deffered to next visit; 03/16/2018: 7sec     Time  8    Period  Weeks    Status  On-going      PT LONG TERM GOAL #5   Title  Patient will have a worst lumbar pain of a 3/10 over the past week to indicate functional improvement with performing standing activities and prolonged sitting.    Baseline  Worst pain: 8/10; 02/14/2018: worst pain: 7/10; 03/16/2018: 3/10    Time  6    Period  Weeks    Status  Achieved      PT LONG TERM GOAL #6   Title  Patient will be able to sit for >1 hour to improve abiltity to drive without increase in pain.    Baseline  Increased pain after driving for <42PNT; 12/31/4313: Patient reports increase in pain for 53mn; 03/16/2018: 1 hour    Time  6    Period  Weeks    Status  Achieved            Plan - 03/23/18 1015  Clinical Impression Statement  Focused on  employing techniques to decrease pain with trasnferring from sitting to standing without increase in pain. Patient demonstrates decreased pain with transferring from sitting to lying after cueing to activate her glutes and anterior core indicating poor coordination. Patient will benefit from further skiled therapy to return to prior level of function.     Rehab Potential  Good    Clinical Impairments Affecting Rehab Potential  Positive: motivation; Negative: , age    PT Frequency  2x / week    PT Duration  6 weeks    PT Treatment/Interventions  ADLs/Self Care Home Management;Aquatic Therapy;Biofeedback;DME Instruction;Gait training;Stair training;Functional mobility training;Therapeutic activities;Therapeutic exercise;Balance training;Neuromuscular re-education;Patient/family education;Passive range of motion;Energy conservation;Ultrasound;Moist Heat;Iontophoresis 70m/ml Dexamethasone;Cryotherapy;Electrical Stimulation;Manual techniques;Dry needling    PT Next Visit Plan  progress core and back strengthening    PT Home Exercise Plan  see education section    Consulted and Agree with Plan of Care  Patient       Patient will benefit from skilled therapeutic intervention in order to improve the following deficits and impairments:  Pain, Increased fascial restricitons, Decreased coordination, Decreased mobility, Decreased activity tolerance, Decreased endurance, Decreased range of motion, Decreased strength, Hypomobility, Postural dysfunction, Abnormal gait, Decreased balance, Difficulty walking  Visit Diagnosis: Muscle weakness (generalized)  Acute bilateral low back pain without sciatica     Problem List Patient Active Problem List   Diagnosis Date Noted  . Chronic bilateral low back pain without sciatica 12/31/2017  . Hypomagnesemia 09/29/2017  . Hypokalemia 09/29/2017  . Atrial fibrillation ([place] 09/10/2017  . Trapezius strain, right, initial encounter 09/10/2017  . Depression, major,  single episode, mild ([place] 09/10/2017  . Chronic venous insufficiency 09/09/2017  . Lymphedema 09/09/2017  . Pain of toe of left foot 08/17/2017  . Grief 08/17/2017  . Epistaxis 07/31/2017  . Leg swelling 07/31/2017  . Dry eyes 07/06/2017  . Pain and swelling of lower extremity, right 03/29/2017  . Right shoulder pain 03/29/2017  . Diarrhea 05/18/2016  . Fall 05/01/2016  . Unintentional weight loss 12/30/2015  . Leg weakness, bilateral 12/30/2015  . Hyperkalemia 01/01/2015  . Occasional numbness/prickling/tingling of fingers and toes 12/18/2014  . Routine general medical examination at a health care facility 12/13/2013  . Macrocytic anemia 05/19/2013  . Psoriatic arthritis ([place] 02/03/2013  . Psoriasis   . Osteoarthritis, multiple sites   . Episodic mood disorder (HT[place] 12/01/2011  . History of breast cancer 10/28/2011  . GERD (gastroesophageal reflux disease)   . Hyperlipidemia   . Hypertension   . Osteopenia   . Carotid stenosis 05/27/2011    WBlythe Stanford PT DPT 03/23/2018, 10:32 AM  [facility] [address] [place] [id number]: [phone]  Fax:  [phone] Name: Dana VARIMRN: [id number] of Birth: [birth year]

## 2018-03-28 ENCOUNTER — Ambulatory Visit: Payer: Medicare Other

## 2018-03-28 DIAGNOSIS — M6281 Muscle weakness (generalized): Secondary | ICD-10-CM

## 2018-03-28 DIAGNOSIS — M545 Low back pain, unspecified: Secondary | ICD-10-CM

## 2018-03-28 DIAGNOSIS — Z9181 History of falling: Secondary | ICD-10-CM

## 2018-03-28 NOTE — Therapy (Signed)
[facility] [address], [place], [address] Phone: [phone]   Fax:  [phone]  Physical Therapy Treatment  Patient Details  Name: [name] MRN: [id number] Date of Birth: 1940/11/12 Referring Provider: Caryl Bis MD   Encounter Date: 03/28/2018  PT End of Session - 03/28/18 1555    Visit Number  20    Number of Visits  29    Date for PT Re-Evaluation  04/13/18    Authorization Type  2/10    PT Start Time  8756    PT Stop Time  1545    PT Time Calculation (min)  30 min    Activity Tolerance  Patient tolerated treatment well    Behavior During Therapy  [facility] for tasks assessed/performed       Past Medical History:  Diagnosis Date  . (HFpEF) heart failure with preserved ejection fraction ([place])    a. 08/2017 Echo: EF 55-60%, no rwma, mild MR, mildly dil LA, nl RV fxn.  . Breast cancer ([place]) 2001   left breast  . Cancer ([place]) 2001   left breast ca  . Carotid arterial disease ([place])    a. 10/2004 s/p L CEA; b. 12/2015 Carotid U/S: RICA 1-39%; b. LICA patent CEA site.  Marland Kitchen GERD (gastroesophageal reflux disease)   . Hyperlipidemia   . Hypertension   . Osteoarthritis, multiple sites   . Osteopenia   . Persistent atrial fibrillation ([place])    a. Dx 08/2017; b. CHA2DS2VASc = 6-->Xarelto; c. 09/2017 Successful DCCV (second shock - 200J);   Marland Kitchen Personal history of radiation therapy 2001   left breast ca  . Psoriasis     Past Surgical History:  Procedure Laterality Date  . ABDOMINAL HYSTERECTOMY    . ANKLE FRACTURE SURGERY  4/08   left---hardware still in place  . 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 BREAST EXCISIONAL BIOPSY Left yrs ago   benign  . BREAST LUMPECTOMY Left 2001   f/u radiation  . CARDIOVERSION N/A 10/04/2017   Procedure: CARDIOVERSION;  Surgeon: Wellington Hampshire, MD;  Location: [facility] ORS;  Service: Cardiovascular;  Laterality: N/A;  . CARDIOVERSION N/A 12/16/2017   Procedure:  CARDIOVERSION;  Surgeon: [name], MD;  Location: [facility] ORS;  Service: Cardiovascular;  Laterality: N/A;  . CAROTID ENDARTERECTOMY Left 10/23/2004  . FRACTURE SURGERY    . OOPHORECTOMY    . SHOULDER SURGERY  6/07   left  . TONSILLECTOMY AND ADENOIDECTOMY    . TOTAL HIP ARTHROPLASTY  2004   right    There were no vitals filed for this visit.  Subjective Assessment - 03/28/18 1531    Subjective  Patient reports she has been improving overall with her functioning and states improvement with back pain overall. Patient reports she continues have pain with transferring.     Pertinent History  Patient previously seen for balance difficulties and shoulder pain and the right.  Patient reports no falls in the past six months. Patient reports pain has not improved for the past month and has been about the same since the onset of pain.     Limitations  Walking    Patient Stated Goals  To improve balance when walking     Currently in Pain?  No/denies    Pain Onset  More than a month ago        TREATMENT Therapeutic Exercises  LTRs on physioball x20 Physioball bridges -- x 20  Knees to chest with physioball -- x 20  TRX squats -- 2 x 10 Hip abduction at hip machine -- 2 x 10 40#  Pt demonstrates fatigue at end of treatment session.   PT Education - 03/28/18 1554    Education provided  Yes    Education Details  form/technique with exercise    Person(s) Educated  Patient    Methods  Explanation;Demonstration    Comprehension  Verbalized understanding;Returned demonstration          PT Long Term Goals - 03/16/18 1018      PT LONG TERM GOAL #1   Title  Patient will be independent with HEP to continue benefits of therapy after discharge.     Baseline  Dependent with form and technique for balancing exercises; moderate cueing for balance and strengthening; 02/14/2018: Independent with HEP     Time  4    Period  Weeks    Status  Achieved      PT LONG TERM GOAL #2   Title   Patient will improve FGA to over 26/30 to indicate functional improvement in strength and balance limitations and decrease fall risk    Baseline  22/30; ; 09/01/2017: 24/30; 09/30/2017: 25/30; 11/03/2017: 25/30; 02/14/2018: deferred to next visit; 03/16/2018: 28/30    Time  8    Period  Weeks    Status  Achieved      PT LONG TERM GOAL #3   Title  Patient will improve 5xSTS to under 16 seconds without use of a pillow to indicate functional improvement of LE function and decreased fall risk    Baseline  19 sec with pillow under chair; 09/01/2017 12 seconds with use of a pillow; 09/30/2017: 10.9 sec with use of pillow; defered today secondary to increased soreness from walking the previous day    Time  8    Period  Weeks    Status  Partially Met      PT LONG TERM GOAL #4   Title  Patient will be able to balance for 10sec with SLS to improve static balance and ability to get dressed when in standing    Baseline  1 sec; 09/01/2017: 3 sec each leg; 09/30/17: 4.5; 11/03/2017: 5 sec; 02/14/2018: Deffered to next visit; 03/16/2018: 7sec     Time  8    Period  Weeks    Status  On-going      PT LONG TERM GOAL #5   Title  Patient will have a worst lumbar pain of a 3/10 over the past week to indicate functional improvement with performing standing activities and prolonged sitting.    Baseline  Worst pain: 8/10; 02/14/2018: worst pain: 7/10; 03/16/2018: 3/10    Time  6    Period  Weeks    Status  Achieved      PT LONG TERM GOAL #6   Title  Patient will be able to sit for >1 hour to improve abiltity to drive without increase in pain.    Baseline  Increased pain after driving for <30min; 02/14/2018: Patient reports increase in pain for 30min; 03/16/2018: 1 hour    Time  6    Period  Weeks    Status  Achieved            Plan - 03/28/18 1555    Clinical Impression Statement  Patient demonstrates no increase in pain throughout the treatment session and is able to perform an advancement with bridges compared  to the previous sessions   indicating functional improvement. Patient demonstrates increased pain with resisted lumbar extension which improves after performing greater amount of reps indicating improvement with coordination. Patient will benefit from further skilled therapy to return to prior level of function.     Rehab Potential  Good    Clinical Impairments Affecting Rehab Potential  Positive: motivation; Negative: , age    PT Frequency  2x / week    PT Duration  6 weeks    PT Treatment/Interventions  ADLs/Self Care Home Management;Aquatic Therapy;Biofeedback;DME Instruction;Gait training;Stair training;Functional mobility training;Therapeutic activities;Therapeutic exercise;Balance training;Neuromuscular re-education;Patient/family education;Passive range of motion;Energy conservation;Ultrasound;Moist Heat;Iontophoresis 51m/ml Dexamethasone;Cryotherapy;Electrical Stimulation;Manual techniques;Dry needling    PT Next Visit Plan  progress core and back strengthening    PT Home Exercise Plan  see education section    Consulted and Agree with Plan of Care  Patient       Patient will benefit from skilled therapeutic intervention in order to improve the following deficits and impairments:  Pain, Increased fascial restricitons, Decreased coordination, Decreased mobility, Decreased activity tolerance, Decreased endurance, Decreased range of motion, Decreased strength, Hypomobility, Postural dysfunction, Abnormal gait, Decreased balance, Difficulty walking  Visit Diagnosis: Muscle weakness (generalized)  Acute bilateral low back pain without sciatica  History of falling     Problem List Patient Active Problem List   Diagnosis Date Noted  . Chronic bilateral low back pain without sciatica 12/31/2017  . Hypomagnesemia 09/29/2017  . Hypokalemia 09/29/2017  . Atrial fibrillation ([place] 09/10/2017  . Trapezius strain, right, initial encounter 09/10/2017  . Depression, major, single episode, mild  ([place] 09/10/2017  . Chronic venous insufficiency 09/09/2017  . Lymphedema 09/09/2017  . Pain of toe of left foot 08/17/2017  . Grief 08/17/2017  . Epistaxis 07/31/2017  . Leg swelling 07/31/2017  . Dry eyes 07/06/2017  . Pain and swelling of lower extremity, right 03/29/2017  . Right shoulder pain 03/29/2017  . Diarrhea 05/18/2016  . Fall 05/01/2016  . Unintentional weight loss 12/30/2015  . Leg weakness, bilateral 12/30/2015  . Hyperkalemia 01/01/2015  . Occasional numbness/prickling/tingling of fingers and toes 12/18/2014  . Routine general medical examination at a health care facility 12/13/2013  . Macrocytic anemia 05/19/2013  . Psoriatic arthritis ([place] 02/03/2013  . Psoriasis   . Osteoarthritis, multiple sites   . Episodic mood disorder ([place] 12/01/2011  . History of breast cancer 10/28/2011  . GERD (gastroesophageal reflux disease)   . Hyperlipidemia   . Hypertension   . Osteopenia   . Carotid stenosis 05/27/2011    WBlythe Stanford PT DPT 03/28/2018, 4:02 PM  [facility] [address] [place] [address]Phone: [phone]  Fax:  [phone] Name: CEMMELY BITTINGERMRN: [id number] of Birth: 8Nov 15, 1942

## 2018-03-30 ENCOUNTER — Ambulatory Visit: Payer: Medicare Other

## 2018-03-30 DIAGNOSIS — M545 Low back pain, unspecified: Secondary | ICD-10-CM

## 2018-03-30 DIAGNOSIS — M6281 Muscle weakness (generalized): Secondary | ICD-10-CM | POA: Diagnosis not present

## 2018-03-30 DIAGNOSIS — Z9181 History of falling: Secondary | ICD-10-CM

## 2018-03-30 NOTE — Therapy (Signed)
[facility] [address], [place], [address] Phone: [phone]   Fax:  [phone]  Physical Therapy Treatment  Patient Details  Name: [name] MRN: [id number] Date of Birth: 1941/02/03 Referring Provider: Caryl Bis MD   Encounter Date: 03/30/2018  PT End of Session - 03/30/18 1020    Visit Number  21    Number of Visits  29    Date for PT Re-Evaluation  04/13/18    Authorization Type  3/10    PT Start Time  0945    PT Stop Time  1030    PT Time Calculation (min)  45 min    Activity Tolerance  Patient tolerated treatment well    Behavior During Therapy  [facility] for tasks assessed/performed       Past Medical History:  Diagnosis Date  . (HFpEF) heart failure with preserved ejection fraction ([place])    a. 08/2017 Echo: EF 55-60%, no rwma, mild MR, mildly dil LA, nl RV fxn.  . Breast cancer ([place]) 2001   left breast  . Cancer ([place]) 2001   left breast ca  . Carotid arterial disease ([place])    a. 10/2004 s/p L CEA; b. 12/2015 Carotid U/S: RICA 1-39%; b. LICA patent CEA site.  Marland Kitchen GERD (gastroesophageal reflux disease)   . Hyperlipidemia   . Hypertension   . Osteoarthritis, multiple sites   . Osteopenia   . Persistent atrial fibrillation ([place])    a. Dx 08/2017; b. CHA2DS2VASc = 6-->Xarelto; c. 09/2017 Successful DCCV (second shock - 200J);   Marland Kitchen Personal history of radiation therapy 2001   left breast ca  . Psoriasis     Past Surgical History:  Procedure Laterality Date  . ABDOMINAL HYSTERECTOMY    . ANKLE FRACTURE SURGERY  4/08   left---hardware still in place  . 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 BREAST EXCISIONAL BIOPSY Left yrs ago   benign  . BREAST LUMPECTOMY Left 2001   f/u radiation  . CARDIOVERSION N/A 10/04/2017   Procedure: CARDIOVERSION;  Surgeon: Wellington Hampshire, MD;  Location: [facility] ORS;  Service: Cardiovascular;  Laterality: N/A;  . CARDIOVERSION N/A 12/16/2017   Procedure:  CARDIOVERSION;  Surgeon: [name], MD;  Location: [facility] ORS;  Service: Cardiovascular;  Laterality: N/A;  . CAROTID ENDARTERECTOMY Left 10/23/2004  . FRACTURE SURGERY    . OOPHORECTOMY    . SHOULDER SURGERY  6/07   left  . TONSILLECTOMY AND ADENOIDECTOMY    . TOTAL HIP ARTHROPLASTY  2004   right    There were no vitals filed for this visit.  Subjective Assessment - 03/30/18 1005    Subjective  Patient reports no major changes since the previous treatment session and patient reports she has been performing greater activities at home.     Pertinent History  Patient previously seen for balance difficulties and shoulder pain and the right.  Patient reports no falls in the past six months. Patient reports pain has not improved for the past month and has been about the same since the onset of pain.     Limitations  Walking    Patient Stated Goals  To improve balance when walking     Currently in Pain?  No/denies    Pain Onset  More than a month ago        TREATMENT Therapeutic Exercises   LTRs on physioball x20 Bridges in hooklying - x 20 Physioball knees to  chest - x 20  Hip abduction at hip machine -- 2 x 10 40# Hip extension at hip machine - 2 x 10 70# TRX squats -- 2 x 10 Heel taps/ hip hikes in standing - 2 x 10  B off a 4" step  Heel raises off of 4" step - 2x 20    Pt demonstrates fatigue at end of treatment session.    PT Education - 03/30/18 1009    Education provided  Yes    Education Details  form/technique with exercise    Person(s) Educated  Patient    Methods  Explanation;Demonstration    Comprehension  Verbalized understanding;Returned demonstration          PT Long Term Goals - 03/16/18 1018      PT LONG TERM GOAL #1   Title  Patient will be independent with HEP to continue benefits of therapy after discharge.     Baseline  Dependent with form and technique for balancing exercises; moderate cueing for balance and strengthening; 02/14/2018: Independent  with HEP     Time  4    Period  Weeks    Status  Achieved      PT LONG TERM GOAL #2   Title  Patient will improve FGA to over 26/30 to indicate functional improvement in strength and balance limitations and decrease fall risk    Baseline  22/30; ; 09/01/2017: 24/30; 09/30/2017: 25/30; 11/03/2017: 25/30; 02/14/2018: deferred to next visit; 03/16/2018: 28/30    Time  8    Period  Weeks    Status  Achieved      PT LONG TERM GOAL #3   Title  Patient will improve 5xSTS to under 16 seconds without use of a pillow to indicate functional improvement of LE function and decreased fall risk    Baseline  19 sec with pillow under chair; 09/01/2017 12 seconds with use of a pillow; 09/30/2017: 10.9 sec with use of pillow; defered today secondary to increased soreness from walking the previous day    Time  8    Period  Weeks    Status  Partially Met      PT LONG TERM GOAL #4   Title  Patient will be able to balance for 10sec with SLS to improve static balance and ability to get dressed when in standing    Baseline  1 sec; 09/01/2017: 3 sec each leg; 09/30/17: 4.5; 11/03/2017: 5 sec; 02/14/2018: Deffered to next visit; 03/16/2018: 7sec     Time  8    Period  Weeks    Status  On-going      PT LONG TERM GOAL #5   Title  Patient will have a worst lumbar pain of a 3/10 over the past week to indicate functional improvement with performing standing activities and prolonged sitting.    Baseline  Worst pain: 8/10; 02/14/2018: worst pain: 7/10; 03/16/2018: 3/10    Time  6    Period  Weeks    Status  Achieved      PT LONG TERM GOAL #6   Title  Patient will be able to sit for >1 hour to improve abiltity to drive without increase in pain.    Baseline  Increased pain after driving for <55HRC; 1/63/8453: Patient reports increase in pain for 25mn; 03/16/2018: 1 hour    Time  6    Period  Weeks    Status  Achieved            Plan -  03/30/18 1047    Clinical Impression Statement  Focused today's session on improving  hip strengthening exercises in both standing and sitting exercises as patient demonstrates poor single leg balance and increased pain with performance of bed mobility activities. Patient's LBP is improving considerably with pain only onsetting with transferring from sitting to lying and waking up in the morning. Patient continues to have balance difficulties in single leg stance and will benefit from further skilled therapy to return to prior level of function.     Rehab Potential  Good    Clinical Impairments Affecting Rehab Potential  Positive: motivation; Negative: , age    PT Frequency  2x / week    PT Duration  6 weeks    PT Treatment/Interventions  ADLs/Self Care Home Management;Aquatic Therapy;Biofeedback;DME Instruction;Gait training;Stair training;Functional mobility training;Therapeutic activities;Therapeutic exercise;Balance training;Neuromuscular re-education;Patient/family education;Passive range of motion;Energy conservation;Ultrasound;Moist Heat;Iontophoresis 66m/ml Dexamethasone;Cryotherapy;Electrical Stimulation;Manual techniques;Dry needling    PT Next Visit Plan  progress core and back strengthening    PT Home Exercise Plan  see education section    Consulted and Agree with Plan of Care  Patient       Patient will benefit from skilled therapeutic intervention in order to improve the following deficits and impairments:  Pain, Increased fascial restricitons, Decreased coordination, Decreased mobility, Decreased activity tolerance, Decreased endurance, Decreased range of motion, Decreased strength, Hypomobility, Postural dysfunction, Abnormal gait, Decreased balance, Difficulty walking  Visit Diagnosis: Muscle weakness (generalized)  Acute bilateral low back pain without sciatica  History of falling     Problem List Patient Active Problem List   Diagnosis Date Noted  . Chronic bilateral low back pain without sciatica 12/31/2017  . Hypomagnesemia 09/29/2017  . Hypokalemia  09/29/2017  . Atrial fibrillation ([place] 09/10/2017  . Trapezius strain, right, initial encounter 09/10/2017  . Depression, major, single episode, mild ([place] 09/10/2017  . Chronic venous insufficiency 09/09/2017  . Lymphedema 09/09/2017  . Pain of toe of left foot 08/17/2017  . Grief 08/17/2017  . Epistaxis 07/31/2017  . Leg swelling 07/31/2017  . Dry eyes 07/06/2017  . Pain and swelling of lower extremity, right 03/29/2017  . Right shoulder pain 03/29/2017  . Diarrhea 05/18/2016  . Fall 05/01/2016  . Unintentional weight loss 12/30/2015  . Leg weakness, bilateral 12/30/2015  . Hyperkalemia 01/01/2015  . Occasional numbness/prickling/tingling of fingers and toes 12/18/2014  . Routine general medical examination at a health care facility 12/13/2013  . Macrocytic anemia 05/19/2013  . Psoriatic arthritis ([place] 02/03/2013  . Psoriasis   . Osteoarthritis, multiple sites   . Episodic mood disorder ([place] 12/01/2011  . History of breast cancer 10/28/2011  . GERD (gastroesophageal reflux disease)   . Hyperlipidemia   . Hypertension   . Osteopenia   . Carotid stenosis 05/27/2011    WBlythe Stanford PT DPT 03/30/2018, 10:51 AM  [facility] [address] [place] [address]Phone: [phone]  Fax:  [phone] Name: CFLORENE BRILLMRN: [id number] of Birth: 801/21/1942

## 2018-03-31 ENCOUNTER — Ambulatory Visit (INDEPENDENT_AMBULATORY_CARE_PROVIDER_SITE_OTHER): Payer: Medicare Other | Admitting: Internal Medicine

## 2018-03-31 ENCOUNTER — Encounter: Payer: Self-pay | Admitting: Internal Medicine

## 2018-03-31 VITALS — BP 128/88 | HR 49 | Ht 66.5 in | Wt 126.8 lb

## 2018-03-31 DIAGNOSIS — I4589 Other specified conduction disorders: Secondary | ICD-10-CM | POA: Diagnosis not present

## 2018-03-31 DIAGNOSIS — R001 Bradycardia, unspecified: Secondary | ICD-10-CM

## 2018-03-31 DIAGNOSIS — I481 Persistent atrial fibrillation: Secondary | ICD-10-CM | POA: Diagnosis not present

## 2018-03-31 DIAGNOSIS — I4819 Other persistent atrial fibrillation: Secondary | ICD-10-CM

## 2018-03-31 NOTE — Patient Instructions (Addendum)
Medication Instructions: - Your physician recommends that you continue on your current medications as directed. Please refer to the Current Medication list given to you today.  Labwork: - none ordered  Procedures/Testing: - Your physician has requested that you have an exercise tolerance test (on a day Dr. Caryl Comes is in the office).  - you may eat a light breakfast/ lunch prior to your procedure - no caffeine for 24 hours prior to your test (coffee, tea, soft drinks, or chocolate)  - no smoking/ vaping for 4 hours prior to your test - you may take your regular medications the day of your test  - bring any inhalers with you to your test - wear comfortable clothing & tennis/ non-skid shoes to walk on the treadmill  Follow-Up: - pending results of the treadmill test  Any Additional Special Instructions Will Be Listed Below (If Applicable).     If you need a refill on your cardiac medications before your next appointment, please call your pharmacy.

## 2018-03-31 NOTE — Progress Notes (Signed)
Patient Care Team: Dana Haven, MD as PCP - General (Family Medicine) Dana Sprang, MD as PCP - Cardiology (Cardiology)   HPI  [name] is a 77 y.o. female Seen in follow-up for recent hospitalization having presented with acute shortness of breath and found to be in rapid atrial fibrillation.  Rate control was accomplished with beta-blockers and calcium blockers and anticoagulation with Xarelto.  She has had recurrent atrial fib with RVR  UnderwentDCCV with rapid reversion  Started flecainide 5/19  DATE PR interval QRSduration Dose-Flec  5/19  NA 78 0  6/19 210 84 50  9/19 210 90 50  \]     DATE TEST EF   2/19 Echo   65 % LAE (48/2.8/46)         Date Cr K Hgb  2/19 0.8 3.9 12.5  5/19  1.12 3.8     Thromboembolic risk factors ( age  -2, HTN-1, Vasc disease -1, Gender-1) for a CHADSVASc Score of 5  Records and Results Reviewed hosp records     Struggled with Fathers day; anniversary came and went was not too bad and her birthday which her husband had been unable to remember for years was celebrated with great enthusiasm by friends and family  She remains unable to do what she would like to do.  There is fatigue.  This is predominant.  Occasional dyspnea.  Trace edema.  Also some arthritis.  She is [name] his wife.  Son, [name], is an Pharmacologist. Past Medical History:  Diagnosis Date  . (HFpEF) heart failure with preserved ejection fraction ([place])    a. 08/2017 Echo: EF 55-60%, no rwma, mild MR, mildly dil LA, nl RV fxn.  . Breast cancer ([place]) 2001   left breast  . Cancer ([place]) 2001   left breast ca  . Carotid arterial disease ([place])    a. 10/2004 s/p L CEA; b. 12/2015 Carotid U/S: RICA 1-39%; b. LICA patent CEA site.  Marland Kitchen GERD (gastroesophageal reflux disease)   . Hyperlipidemia   . Hypertension   . Osteoarthritis, multiple sites   . Osteopenia   . Persistent atrial fibrillation ([place])    a. Dx 08/2017; b. CHA2DS2VASc =  6-->Xarelto; c. 09/2017 Successful DCCV (second shock - 200J);   Marland Kitchen Personal history of radiation therapy 2001   left breast ca  . Psoriasis     Past Surgical History:  Procedure Laterality Date  . ABDOMINAL HYSTERECTOMY    . ANKLE FRACTURE SURGERY  4/08   left---hardware still in place  . 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 BREAST EXCISIONAL BIOPSY Left yrs ago   benign  . BREAST LUMPECTOMY Left 2001   f/u radiation  . CARDIOVERSION N/A 10/04/2017   Procedure: CARDIOVERSION;  Surgeon: Wellington Hampshire, MD;  Location: [facility] ORS;  Service: Cardiovascular;  Laterality: N/A;  . CARDIOVERSION N/A 12/16/2017   Procedure: CARDIOVERSION;  Surgeon: Dana Sprang, MD;  Location: [facility] ORS;  Service: Cardiovascular;  Laterality: N/A;  . CAROTID ENDARTERECTOMY Left 10/23/2004  . FRACTURE SURGERY    . OOPHORECTOMY    . SHOULDER SURGERY  6/07   left  . TONSILLECTOMY AND ADENOIDECTOMY    . TOTAL HIP ARTHROPLASTY  2004   right    Current Outpatient Medications  Medication Sig Dispense Refill  . Cholecalciferol (VITAMIN D3) 1000 units CAPS Take 1 capsule by mouth 2 (two) times daily.     . dabigatran (PRADAXA)  150 MG CAPS capsule Take 1 capsule (150 mg total) by mouth 2 (two) times daily. 180 capsule 3  . flecainide (TAMBOCOR) 50 MG tablet Take 1 tablet (50 mg total) by mouth 2 (two) times daily. 60 tablet 3  . furosemide (LASIX) 40 MG tablet TAKE 1 TABLET BY MOUTH EVERY DAY 30 tablet 3  . LORazepam (ATIVAN) 0.5 MG tablet TAKE 1 TABLET BY MOUTH 2 TIMES DAILY AS NEEDED FOR ANXIETY 60 tablet 0  . metoprolol tartrate (LOPRESSOR) 50 MG tablet Take 1/2 tablet (25 mg) by mouth twice daily    . mirtazapine (REMERON) 15 MG tablet TAKE 1 TABLET (15 MG TOTAL) BY MOUTH AT BEDTIME. 30 tablet 3  . Multiple Vitamin (MULTIVITAMIN) capsule Take 1 capsule by mouth daily.      . Multiple Vitamins-Minerals (PRESERVISION AREDS 2) CAPS Take 1 tablet by mouth 2 (two) times daily.     No current  facility-administered medications for this visit.     No Known Allergies    Review of Systems negative except from HPI and PMH  Physical Exam BP 128/88 (BP Location: Left Arm, Patient Position: Sitting, Cuff Size: Normal)   Pulse (!) 49   Ht 5' 6.5" (1.689 m)   Wt 126 lb 12 oz (57.5 kg)   BMI 20.15 kg/m  Well developed and nourished in no acute distress HENT normal Neck supple with JVP-flat Clear Slow but Regular rate and rhythm, no murmurs or gallops Abd-soft with active BS No Clubbing cyanosis tr edema Skin-warm and dry A & Oriented  Grossly normal sensory and motor function   ECG sinus at 49 21/09/48  Assessment and  Plan  Atrial fibrillation-persistent with a rapid rate  Sinus brady  Grief  Peripheral vascular disease carotid artery occlusion  On Anticoagulation;  No bleeding issues   No intercurrent atrial fibrillation or flutter  Fatigue may be related to sinus bradycardia.  We have discussed the symptom driven evaluation; will undertake GXT for chronotropic competence and then make a decision regarding pacing.  Minimal volume overload.  Continue diuretics.

## 2018-04-01 ENCOUNTER — Other Ambulatory Visit: Payer: Self-pay | Admitting: Family Medicine

## 2018-04-04 ENCOUNTER — Ambulatory Visit: Payer: Medicare Other

## 2018-04-04 DIAGNOSIS — Z9181 History of falling: Secondary | ICD-10-CM | POA: Diagnosis not present

## 2018-04-04 DIAGNOSIS — M545 Low back pain, unspecified: Secondary | ICD-10-CM

## 2018-04-04 DIAGNOSIS — M6281 Muscle weakness (generalized): Secondary | ICD-10-CM

## 2018-04-04 NOTE — Therapy (Signed)
[facility] [address] [place], [address] Phone: [phone]   Fax:  [phone]  Physical Therapy Treatment  Patient Details  Name: [name] MRN: [id number] Date of Birth: Jul 16, 1941 Referring Provider: Caryl Bis MD   Encounter Date: 04/04/2018  PT End of Session - 04/04/18 1515    Visit Number  22    Number of Visits  29    Date for PT Re-Evaluation  04/13/18    Authorization Type  4/10    PT Start Time  1430    PT Stop Time  1515    PT Time Calculation (min)  45 min    Activity Tolerance  Patient tolerated treatment well    Behavior During Therapy  [facility] for tasks assessed/performed       Past Medical History:  Diagnosis Date  . (HFpEF) heart failure with preserved ejection fraction ([place])    a. 08/2017 Echo: EF 55-60%, no rwma, mild MR, mildly dil LA, nl RV fxn.  . Breast cancer ([place]) 2001   left breast  . Cancer ([place]) 2001   left breast ca  . Carotid arterial disease ([place])    a. 10/2004 s/p L CEA; b. 12/2015 Carotid U/S: RICA 1-39%; b. LICA patent CEA site.  Marland Kitchen GERD (gastroesophageal reflux disease)   . Hyperlipidemia   . Hypertension   . Osteoarthritis, multiple sites   . Osteopenia   . Persistent atrial fibrillation ([place])    a. Dx 08/2017; b. CHA2DS2VASc = 6-->Xarelto; c. 09/2017 Successful DCCV (second shock - 200J);   Marland Kitchen Personal history of radiation therapy 2001   left breast ca  . Psoriasis     Past Surgical History:  Procedure Laterality Date  . ABDOMINAL HYSTERECTOMY    . ANKLE FRACTURE SURGERY  4/08   left---hardware still in place  . 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 BREAST EXCISIONAL BIOPSY Left yrs ago   benign  . BREAST LUMPECTOMY Left 2001   f/u radiation  . CARDIOVERSION N/A 10/04/2017   Procedure: CARDIOVERSION;  Surgeon: Wellington Hampshire, MD;  Location: [facility] ORS;  Service: Cardiovascular;  Laterality: N/A;  . CARDIOVERSION N/A 12/16/2017   Procedure:  CARDIOVERSION;  Surgeon: [name], MD;  Location: [facility] ORS;  Service: Cardiovascular;  Laterality: N/A;  . CAROTID ENDARTERECTOMY Left 10/23/2004  . FRACTURE SURGERY    . OOPHORECTOMY    . SHOULDER SURGERY  6/07   left  . TONSILLECTOMY AND ADENOIDECTOMY    . TOTAL HIP ARTHROPLASTY  2004   right    There were no vitals filed for this visit.  Subjective Assessment - 04/04/18 1501    Subjective  Patient reports no majot changes since the previous session. Patient states she went to her cardiologist who said her HR was not improving and talked to her about having a pace maker implanted.     Pertinent History  Patient previously seen for balance difficulties and shoulder pain and the right.  Patient reports no falls in the past six months. Patient reports pain has not improved for the past month and has been about the same since the onset of pain.     Limitations  Walking    Patient Stated Goals  To improve balance when walking     Currently in Pain?  No/denies    Pain Onset  More than a month ago         TREATMENT Therapeutic Exercises LTRs on  physioball --  x20 Bridges in hooklying - x 20 Physioball knees to chest - x 20  Hip abduction at hip machine -- 2 x 10 40# Hip extension at hip machine - 2 x 10 70# Side stepping up and over 6" - x 10 TRX squats -- 2 x 10 Side stepping over 4 balance stones - x 10    Pt demonstrates fatigue at end of treatment session.    PT Education - 04/04/18 1511    Education provided  Yes    Education Details  form/technique with exercise    Person(s) Educated  Patient    Methods  Explanation;Demonstration    Comprehension  Verbalized understanding;Returned demonstration          PT Long Term Goals - 03/16/18 1018      PT LONG TERM GOAL #1   Title  Patient will be independent with HEP to continue benefits of therapy after discharge.     Baseline  Dependent with form and technique for balancing exercises; moderate cueing for balance  and strengthening; 02/14/2018: Independent with HEP     Time  4    Period  Weeks    Status  Achieved      PT LONG TERM GOAL #2   Title  Patient will improve FGA to over 26/30 to indicate functional improvement in strength and balance limitations and decrease fall risk    Baseline  22/30; ; 09/01/2017: 24/30; 09/30/2017: 25/30; 11/03/2017: 25/30; 02/14/2018: deferred to next visit; 03/16/2018: 28/30    Time  8    Period  Weeks    Status  Achieved      PT LONG TERM GOAL #3   Title  Patient will improve 5xSTS to under 16 seconds without use of a pillow to indicate functional improvement of LE function and decreased fall risk    Baseline  19 sec with pillow under chair; 09/01/2017 12 seconds with use of a pillow; 09/30/2017: 10.9 sec with use of pillow; defered today secondary to increased soreness from walking the previous day    Time  8    Period  Weeks    Status  Partially Met      PT LONG TERM GOAL #4   Title  Patient will be able to balance for 10sec with SLS to improve static balance and ability to get dressed when in standing    Baseline  1 sec; 09/01/2017: 3 sec each leg; 09/30/17: 4.5; 11/03/2017: 5 sec; 02/14/2018: Deffered to next visit; 03/16/2018: 7sec     Time  8    Period  Weeks    Status  On-going      PT LONG TERM GOAL #5   Title  Patient will have a worst lumbar pain of a 3/10 over the past week to indicate functional improvement with performing standing activities and prolonged sitting.    Baseline  Worst pain: 8/10; 02/14/2018: worst pain: 7/10; 03/16/2018: 3/10    Time  6    Period  Weeks    Status  Achieved      PT LONG TERM GOAL #6   Title  Patient will be able to sit for >1 hour to improve abiltity to drive without increase in pain.    Baseline  Increased pain after driving for <57XUX; 8/33/3832: Patient reports increase in pain for 35mn; 03/16/2018: 1 hour    Time  6    Period  Weeks    Status  Achieved  Plan - 04/04/18 1520    Clinical Impression  Statement  Patient continues to have increased difficulty with balancing and strengthening exercises requiring UE support to perform exercises. However, patient demonstrates an improved ability to perform greater amount of exercises without increase in pain and before fatigue. Patient will benefit from further skilled therapy to return to prior level of function.     Rehab Potential  Good    Clinical Impairments Affecting Rehab Potential  Positive: motivation; Negative: , age    PT Frequency  2x / week    PT Duration  6 weeks    PT Treatment/Interventions  ADLs/Self Care Home Management;Aquatic Therapy;Biofeedback;DME Instruction;Gait training;Stair training;Functional mobility training;Therapeutic activities;Therapeutic exercise;Balance training;Neuromuscular re-education;Patient/family education;Passive range of motion;Energy conservation;Ultrasound;Moist Heat;Iontophoresis 76m/ml Dexamethasone;Cryotherapy;Electrical Stimulation;Manual techniques;Dry needling    PT Next Visit Plan  progress core and back strengthening    PT Home Exercise Plan  see education section    Consulted and Agree with Plan of Care  Patient       Patient will benefit from skilled therapeutic intervention in order to improve the following deficits and impairments:  Pain, Increased fascial restricitons, Decreased coordination, Decreased mobility, Decreased activity tolerance, Decreased endurance, Decreased range of motion, Decreased strength, Hypomobility, Postural dysfunction, Abnormal gait, Decreased balance, Difficulty walking  Visit Diagnosis: Muscle weakness (generalized)  Acute bilateral low back pain without sciatica  History of falling     Problem List Patient Active Problem List   Diagnosis Date Noted  . Chronic bilateral low back pain without sciatica 12/31/2017  . Hypomagnesemia 09/29/2017  . Hypokalemia 09/29/2017  . Atrial fibrillation ([place] 09/10/2017  . Trapezius strain, right, initial encounter  09/10/2017  . Depression, major, single episode, mild ([place] 09/10/2017  . Chronic venous insufficiency 09/09/2017  . Lymphedema 09/09/2017  . Pain of toe of left foot 08/17/2017  . Grief 08/17/2017  . Epistaxis 07/31/2017  . Leg swelling 07/31/2017  . Dry eyes 07/06/2017  . Pain and swelling of lower extremity, right 03/29/2017  . Right shoulder pain 03/29/2017  . Diarrhea 05/18/2016  . Fall 05/01/2016  . Unintentional weight loss 12/30/2015  . Leg weakness, bilateral 12/30/2015  . Hyperkalemia 01/01/2015  . Occasional numbness/prickling/tingling of fingers and toes 12/18/2014  . Routine general medical examination at a health care facility 12/13/2013  . Macrocytic anemia 05/19/2013  . Psoriatic arthritis ([place] 02/03/2013  . Psoriasis   . Osteoarthritis, multiple sites   . Episodic mood disorder ([place] 12/01/2011  . History of breast cancer 10/28/2011  . GERD (gastroesophageal reflux disease)   . Hyperlipidemia   . Hypertension   . Osteopenia   . Carotid stenosis 05/27/2011    WBlythe Stanford PT DPT 04/04/2018, 3:26 PM  [facility] [address] [place] [address]Phone: [phone]  Fax:  [phone] Name: Dana MENTINKMRN: [id number] of Birth: 808/14/1942

## 2018-04-06 ENCOUNTER — Ambulatory Visit: Payer: Medicare Other

## 2018-04-06 DIAGNOSIS — Z9181 History of falling: Secondary | ICD-10-CM | POA: Diagnosis not present

## 2018-04-06 DIAGNOSIS — M545 Low back pain, unspecified: Secondary | ICD-10-CM

## 2018-04-06 DIAGNOSIS — M6281 Muscle weakness (generalized): Secondary | ICD-10-CM | POA: Diagnosis not present

## 2018-04-06 NOTE — Therapy (Signed)
[facility] [address] [place], [address] Phone: [phone]   Fax:  [phone]  Physical Therapy Treatment  Patient Details  Name: [name] MRN: [id number] Date of Birth: 1941-05-15 Referring Provider: Caryl Bis MD   Encounter Date: 04/06/2018  PT End of Session - 04/06/18 0958    Visit Number  23    Number of Visits  29    Date for PT Re-Evaluation  04/13/18    Authorization Type  5/10    PT Start Time  0945    PT Stop Time  1030    PT Time Calculation (min)  45 min    Activity Tolerance  Patient tolerated treatment well    Behavior During Therapy  [facility] for tasks assessed/performed       Past Medical History:  Diagnosis Date  . (HFpEF) heart failure with preserved ejection fraction ([place])    a. 08/2017 Echo: EF 55-60%, no rwma, mild MR, mildly dil LA, nl RV fxn.  . Breast cancer ([place]) 2001   left breast  . Cancer ([place]) 2001   left breast ca  . Carotid arterial disease ([place])    a. 10/2004 s/p L CEA; b. 12/2015 Carotid U/S: RICA 1-39%; b. LICA patent CEA site.  Marland Kitchen GERD (gastroesophageal reflux disease)   . Hyperlipidemia   . Hypertension   . Osteoarthritis, multiple sites   . Osteopenia   . Persistent atrial fibrillation ([place])    a. Dx 08/2017; b. CHA2DS2VASc = 6-->Xarelto; c. 09/2017 Successful DCCV (second shock - 200J);   Marland Kitchen Personal history of radiation therapy 2001   left breast ca  . Psoriasis     Past Surgical History:  Procedure Laterality Date  . ABDOMINAL HYSTERECTOMY    . ANKLE FRACTURE SURGERY  4/08   left---hardware still in place  . 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 BREAST EXCISIONAL BIOPSY Left yrs ago   benign  . BREAST LUMPECTOMY Left 2001   f/u radiation  . CARDIOVERSION N/A 10/04/2017   Procedure: CARDIOVERSION;  Surgeon: Wellington Hampshire, MD;  Location: [facility];  Service: Cardiovascular;  Laterality: N/A;  . CARDIOVERSION N/A 12/16/2017   Procedure:  CARDIOVERSION;  Surgeon: [name], MD;  Location: [facility] ORS;  Service: Cardiovascular;  Laterality: N/A;  . CAROTID ENDARTERECTOMY Left 10/23/2004  . FRACTURE SURGERY    . OOPHORECTOMY    . SHOULDER SURGERY  6/07   left  . TONSILLECTOMY AND ADENOIDECTOMY    . TOTAL HIP ARTHROPLASTY  2004   right    There were no vitals filed for this visit.  Subjective Assessment - 04/06/18 0955    Subjective  Patient reports her back has been feeling 'pretty good'. Patient reports showers often help with her pain.     Pertinent History  Patient previously seen for balance difficulties and shoulder pain and the right.  Patient reports no falls in the past six months. Patient reports pain has not improved for the past month and has been about the same since the onset of pain.     Limitations  Walking    Patient Stated Goals  To improve balance when walking     Currently in Pain?  No/denies    Pain Onset  More than a month ago        TREATMENT Therapeutic Exercises LTRs on physioball -- x20 Bridges in hooklying - x 20 Physioball knees to chest - x 20  Standing rotational twists with physioball - x 20  Sitting on physioball pelvic tilts - 2 x 20 Hip extension against RTB in standing - x20 Standing rotations in standing - x 20 B with Green physioball Hip abduction at hip machine -- 2 x 20 25# TRX squats -- 2 x 10   Pt demonstrates fatigue at end of treatment session.    PT Education - 04/06/18 0957    Education provided  Yes    Education Details  form/technique with exercise    Person(s) Educated  Patient    Methods  Explanation;Demonstration    Comprehension  Verbalized understanding;Returned demonstration          PT Long Term Goals - 03/16/18 1018      PT LONG TERM GOAL #1   Title  Patient will be independent with HEP to continue benefits of therapy after discharge.     Baseline  Dependent with form and technique for balancing exercises; moderate cueing for balance and  strengthening; 02/14/2018: Independent with HEP     Time  4    Period  Weeks    Status  Achieved      PT LONG TERM GOAL #2   Title  Patient will improve FGA to over 26/30 to indicate functional improvement in strength and balance limitations and decrease fall risk    Baseline  22/30; ; 09/01/2017: 24/30; 09/30/2017: 25/30; 11/03/2017: 25/30; 02/14/2018: deferred to next visit; 03/16/2018: 28/30    Time  8    Period  Weeks    Status  Achieved      PT LONG TERM GOAL #3   Title  Patient will improve 5xSTS to under 16 seconds without use of a pillow to indicate functional improvement of LE function and decreased fall risk    Baseline  19 sec with pillow under chair; 09/01/2017 12 seconds with use of a pillow; 09/30/2017: 10.9 sec with use of pillow; defered today secondary to increased soreness from walking the previous day    Time  8    Period  Weeks    Status  Partially Met      PT LONG TERM GOAL #4   Title  Patient will be able to balance for 10sec with SLS to improve static balance and ability to get dressed when in standing    Baseline  1 sec; 09/01/2017: 3 sec each leg; 09/30/17: 4.5; 11/03/2017: 5 sec; 02/14/2018: Deffered to next visit; 03/16/2018: 7sec     Time  8    Period  Weeks    Status  On-going      PT LONG TERM GOAL #5   Title  Patient will have a worst lumbar pain of a 3/10 over the past week to indicate functional improvement with performing standing activities and prolonged sitting.    Baseline  Worst pain: 8/10; 02/14/2018: worst pain: 7/10; 03/16/2018: 3/10    Time  6    Period  Weeks    Status  Achieved      PT LONG TERM GOAL #6   Title  Patient will be able to sit for >1 hour to improve abiltity to drive without increase in pain.    Baseline  Increased pain after driving for <11BZM; 0/80/2233: Patient reports increase in pain for 78mn; 03/16/2018: 1 hour    Time  6    Period  Weeks    Status  Achieved            Plan - 04/06/18 1033  Clinical Impression Statement   Conitnued to focus on improving lumbar stabilization and increased strength with performance of standing exercises. Patient demonstrates improvement with exercise performance with ability to perform greater amount of total exercises compared to previous sessions. Patient will benefit from further skilled therapy to return to prior level of function.     Rehab Potential  Good    Clinical Impairments Affecting Rehab Potential  Positive: motivation; Negative: , age    PT Frequency  2x / week    PT Duration  6 weeks    PT Treatment/Interventions  ADLs/Self Care Home Management;Aquatic Therapy;Biofeedback;DME Instruction;Gait training;Stair training;Functional mobility training;Therapeutic activities;Therapeutic exercise;Balance training;Neuromuscular re-education;Patient/family education;Passive range of motion;Energy conservation;Ultrasound;Moist Heat;Iontophoresis 51m/ml Dexamethasone;Cryotherapy;Electrical Stimulation;Manual techniques;Dry needling    PT Next Visit Plan  progress core and back strengthening    PT Home Exercise Plan  see education section    Consulted and Agree with Plan of Care  Patient       Patient will benefit from skilled therapeutic intervention in order to improve the following deficits and impairments:  Pain, Increased fascial restricitons, Decreased coordination, Decreased mobility, Decreased activity tolerance, Decreased endurance, Decreased range of motion, Decreased strength, Hypomobility, Postural dysfunction, Abnormal gait, Decreased balance, Difficulty walking  Visit Diagnosis: Muscle weakness (generalized)  Acute bilateral low back pain without sciatica  History of falling     Problem List Patient Active Problem List   Diagnosis Date Noted  . Chronic bilateral low back pain without sciatica 12/31/2017  . Hypomagnesemia 09/29/2017  . Hypokalemia 09/29/2017  . Atrial fibrillation ([place] 09/10/2017  . Trapezius strain, right, initial encounter 09/10/2017  .  Depression, major, single episode, mild ([place] 09/10/2017  . Chronic venous insufficiency 09/09/2017  . Lymphedema 09/09/2017  . Pain of toe of left foot 08/17/2017  . Grief 08/17/2017  . Epistaxis 07/31/2017  . Leg swelling 07/31/2017  . Dry eyes 07/06/2017  . Pain and swelling of lower extremity, right 03/29/2017  . Right shoulder pain 03/29/2017  . Diarrhea 05/18/2016  . Fall 05/01/2016  . Unintentional weight loss 12/30/2015  . Leg weakness, bilateral 12/30/2015  . Hyperkalemia 01/01/2015  . Occasional numbness/prickling/tingling of fingers and toes 12/18/2014  . Routine general medical examination at a health care facility 12/13/2013  . Macrocytic anemia 05/19/2013  . Psoriatic arthritis ([place] 02/03/2013  . Psoriasis   . Osteoarthritis, multiple sites   . Episodic mood disorder ([place] 12/01/2011  . History of breast cancer 10/28/2011  . GERD (gastroesophageal reflux disease)   . Hyperlipidemia   . Hypertension   . Osteopenia   . Carotid stenosis 05/27/2011    WBlythe Stanford PT DPT 04/06/2018, 10:37 AM  [facility] [address] [place] [address]Phone: [phone]  Fax:  [phone] Name: CKARRY BARRILLEAUXMRN: [id number] of Birth: 8Sep 09, 1942

## 2018-04-11 ENCOUNTER — Other Ambulatory Visit: Payer: Self-pay | Admitting: *Deleted

## 2018-04-11 ENCOUNTER — Ambulatory Visit: Payer: Medicare Other

## 2018-04-11 DIAGNOSIS — M545 Low back pain, unspecified: Secondary | ICD-10-CM

## 2018-04-11 DIAGNOSIS — Z9181 History of falling: Secondary | ICD-10-CM | POA: Diagnosis not present

## 2018-04-11 DIAGNOSIS — M6281 Muscle weakness (generalized): Secondary | ICD-10-CM

## 2018-04-11 MED ORDER — FLECAINIDE ACETATE 50 MG PO TABS: 50.0000 mg | ORAL_TABLET | Freq: Two times a day (BID) | ORAL | 3 refills | Status: DC

## 2018-04-11 NOTE — Therapy (Signed)
[facility] [address] [place], [address] Phone: [phone]   Fax:  [phone]  Physical Therapy Treatment  Patient Details  Name: [name] MRN: [id number] Date of Birth: [birth year] Referring Provider: Caryl Bis MD   Encounter Date: 04/11/2018  PT End of Session - 04/11/18 1613    Visit Number  25    Number of Visits  29    Date for PT Re-Evaluation  04/13/18    Authorization Type  6/10    PT Start Time  1600    PT Stop Time  1645    PT Time Calculation (min)  45 min    Activity Tolerance  Patient tolerated treatment well    Behavior During Therapy  [facility] for tasks assessed/performed       Past Medical History:  Diagnosis Date  . (HFpEF) heart failure with preserved ejection fraction ([place])    a. 08/2017 Echo: EF 55-60%, no rwma, mild MR, mildly dil LA, nl RV fxn.  . Breast cancer ([place]) 2001   left breast  . Cancer ([place]) 2001   left breast ca  . Carotid arterial disease ([place])    a. 10/2004 s/p L CEA; b. 12/2015 Carotid U/S: RICA 1-39%; b. LICA patent CEA site.  Marland Kitchen GERD (gastroesophageal reflux disease)   . Hyperlipidemia   . Hypertension   . Osteoarthritis, multiple sites   . Osteopenia   . Persistent atrial fibrillation ([place])    a. Dx 08/2017; b. CHA2DS2VASc = 6-->Xarelto; c. 09/2017 Successful DCCV (second shock - 200J);   Marland Kitchen Personal history of radiation therapy 2001   left breast ca  . Psoriasis     Past Surgical History:  Procedure Laterality Date  . ABDOMINAL HYSTERECTOMY    . ANKLE FRACTURE SURGERY  4/08   left---hardware still in place  . 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 BREAST EXCISIONAL BIOPSY Left yrs ago   benign  . BREAST LUMPECTOMY Left 2001   f/u radiation  . CARDIOVERSION N/A 10/04/2017   Procedure: CARDIOVERSION;  Surgeon: Wellington Hampshire, MD;  Location: [facility] ORS;  Service: Cardiovascular;  Laterality: N/A;  . CARDIOVERSION N/A 12/16/2017   Procedure:  CARDIOVERSION;  Surgeon: [name], MD;  Location: [facility] ORS;  Service: Cardiovascular;  Laterality: N/A;  . CAROTID ENDARTERECTOMY Left 10/23/2004  . FRACTURE SURGERY    . OOPHORECTOMY    . SHOULDER SURGERY  6/07   left  . TONSILLECTOMY AND ADENOIDECTOMY    . TOTAL HIP ARTHROPLASTY  2004   right    There were no vitals filed for this visit.  Subjective Assessment - 04/11/18 1609    Subjective  Patient reports no major difference in pain today versus previous sessions. Patient reports she has been more active recently.     Pertinent History  Patient previously seen for balance difficulties and shoulder pain and the right.  Patient reports no falls in the past six months. Patient reports pain has not improved for the past month and has been about the same since the onset of pain.     Limitations  Walking    Patient Stated Goals  To improve balance when walking     Currently in Pain?  No/denies    Pain Onset  More than a month ago       TREATMENT Therapeutic Exercises LTRs on physioball -- x20 with green physioball Bridges in hooklying with feet on physioball - x 20  Physioball knees to chest - x 20  Standing lumbar extension with physioball - x 20  Standing rotational twists with physioball - x 20  Hip abduction at hip machine -- 2 x 10 40# Hip extension in standing - 3 x10 YTB  TRX squats -- 2 x 10   Pt demonstrates fatigue at end of treatment session   PT Education - 04/11/18 1612    Education provided  Yes    Education Details  form/technique with exercise    Person(s) Educated  Patient    Methods  Explanation;Demonstration    Comprehension  Verbalized understanding;Returned demonstration          PT Long Term Goals - 03/16/18 1018      PT LONG TERM GOAL #1   Title  Patient will be independent with HEP to continue benefits of therapy after discharge.     Baseline  Dependent with form and technique for balancing exercises; moderate cueing for balance and  strengthening; 02/14/2018: Independent with HEP     Time  4    Period  Weeks    Status  Achieved      PT LONG TERM GOAL #2   Title  Patient will improve FGA to over 26/30 to indicate functional improvement in strength and balance limitations and decrease fall risk    Baseline  22/30; ; 09/01/2017: 24/30; 09/30/2017: 25/30; 11/03/2017: 25/30; 02/14/2018: deferred to next visit; 03/16/2018: 28/30    Time  8    Period  Weeks    Status  Achieved      PT LONG TERM GOAL #3   Title  Patient will improve 5xSTS to under 16 seconds without use of a pillow to indicate functional improvement of LE function and decreased fall risk    Baseline  19 sec with pillow under chair; 09/01/2017 12 seconds with use of a pillow; 09/30/2017: 10.9 sec with use of pillow; defered today secondary to increased soreness from walking the previous day    Time  8    Period  Weeks    Status  Partially Met      PT LONG TERM GOAL #4   Title  Patient will be able to balance for 10sec with SLS to improve static balance and ability to get dressed when in standing    Baseline  1 sec; 09/01/2017: 3 sec each leg; 09/30/17: 4.5; 11/03/2017: 5 sec; 02/14/2018: Deffered to next visit; 03/16/2018: 7sec     Time  8    Period  Weeks    Status  On-going      PT LONG TERM GOAL #5   Title  Patient will have a worst lumbar pain of a 3/10 over the past week to indicate functional improvement with performing standing activities and prolonged sitting.    Baseline  Worst pain: 8/10; 02/14/2018: worst pain: 7/10; 03/16/2018: 3/10    Time  6    Period  Weeks    Status  Achieved      PT LONG TERM GOAL #6   Title  Patient will be able to sit for >1 hour to improve abiltity to drive without increase in pain.    Baseline  Increased pain after driving for <34KAJ; 6/81/1572: Patient reports increase in pain for 57mn; 03/16/2018: 1 hour    Time  6    Period  Weeks    Status  Achieved            Plan - 04/11/18 1700    Clinical Impression  Statement   Patient demonstrates improvement with exercises with ability to perform greater amount of exercises before onset of pain. Patient demosntrates difficulty with performing extension based exercises in standing with inability to acheive full available lumbar AROM. Patient will benefit from further skilled therapy to return to prior level of function.     Rehab Potential  Good    Clinical Impairments Affecting Rehab Potential  Positive: motivation; Negative: , age    PT Frequency  2x / week    PT Duration  6 weeks    PT Treatment/Interventions  ADLs/Self Care Home Management;Aquatic Therapy;Biofeedback;DME Instruction;Gait training;Stair training;Functional mobility training;Therapeutic activities;Therapeutic exercise;Balance training;Neuromuscular re-education;Patient/family education;Passive range of motion;Energy conservation;Ultrasound;Moist Heat;Iontophoresis 59m/ml Dexamethasone;Cryotherapy;Electrical Stimulation;Manual techniques;Dry needling    PT Next Visit Plan  progress core and back strengthening    PT Home Exercise Plan  see education section    Consulted and Agree with Plan of Care  Patient       Patient will benefit from skilled therapeutic intervention in order to improve the following deficits and impairments:  Pain, Increased fascial restricitons, Decreased coordination, Decreased mobility, Decreased activity tolerance, Decreased endurance, Decreased range of motion, Decreased strength, Hypomobility, Postural dysfunction, Abnormal gait, Decreased balance, Difficulty walking  Visit Diagnosis: Muscle weakness (generalized)  Acute bilateral low back pain without sciatica     Problem List Patient Active Problem List   Diagnosis Date Noted  . Chronic bilateral low back pain without sciatica 12/31/2017  . Hypomagnesemia 09/29/2017  . Hypokalemia 09/29/2017  . Atrial fibrillation ([place] 09/10/2017  . Trapezius strain, right, initial encounter 09/10/2017  . Depression, major,  single episode, mild ([place] 09/10/2017  . Chronic venous insufficiency 09/09/2017  . Lymphedema 09/09/2017  . Pain of toe of left foot 08/17/2017  . Grief 08/17/2017  . Epistaxis 07/31/2017  . Leg swelling 07/31/2017  . Dry eyes 07/06/2017  . Pain and swelling of lower extremity, right 03/29/2017  . Right shoulder pain 03/29/2017  . Diarrhea 05/18/2016  . Fall 05/01/2016  . Unintentional weight loss 12/30/2015  . Leg weakness, bilateral 12/30/2015  . Hyperkalemia 01/01/2015  . Occasional numbness/prickling/tingling of fingers and toes 12/18/2014  . Routine general medical examination at a health care facility 12/13/2013  . Macrocytic anemia 05/19/2013  . Psoriatic arthritis ([place] 02/03/2013  . Psoriasis   . Osteoarthritis, multiple sites   . Episodic mood disorder ([place] 12/01/2011  . History of breast cancer 10/28/2011  . GERD (gastroesophageal reflux disease)   . Hyperlipidemia   . Hypertension   . Osteopenia   . Carotid stenosis 05/27/2011    WBlythe Stanford PT DPT 04/11/2018, 5:03 PM  [facility] [address] [place] [address]Phone: [phone]  Fax:  [phone] Name: CCHARLEY LAFRANCEMRN: [id number] of Birth: [birth year]

## 2018-04-13 ENCOUNTER — Ambulatory Visit: Payer: Medicare Other

## 2018-04-13 DIAGNOSIS — M6281 Muscle weakness (generalized): Secondary | ICD-10-CM

## 2018-04-13 DIAGNOSIS — M545 Low back pain, unspecified: Secondary | ICD-10-CM

## 2018-04-13 DIAGNOSIS — Z9181 History of falling: Secondary | ICD-10-CM

## 2018-04-13 NOTE — Therapy (Signed)
[place] [facility] [address] [place], [address] Phone: [phone]   Fax:  [phone]  Physical Therapy Treatment  Patient Details  Name: [name] MRN: [id number] Date of Birth: [birth year] Referring Provider: Caryl Bis MD   Encounter Date: 04/13/2018  PT End of Session - 04/13/18 1013    Visit Number  26    Number of Visits  29    Date for PT Re-Evaluation  04/13/18    Authorization Type  7/10    PT Start Time  1005    PT Stop Time  1045    PT Time Calculation (min)  40 min    Activity Tolerance  Patient tolerated treatment well    Behavior During Therapy  [facility] for tasks assessed/performed       Past Medical History:  Diagnosis Date  . (HFpEF) heart failure with preserved ejection fraction ([place])    a. 08/2017 Echo: EF 55-60%, no rwma, mild MR, mildly dil LA, nl RV fxn.  . Breast cancer ([place]) 2001   left breast  . Cancer ([place]) 2001   left breast ca  . Carotid arterial disease ([place])    a. 10/2004 s/p L CEA; b. 12/2015 Carotid U/S: RICA 1-39%; b. LICA patent CEA site.  Marland Kitchen GERD (gastroesophageal reflux disease)   . Hyperlipidemia   . Hypertension   . Osteoarthritis, multiple sites   . Osteopenia   . Persistent atrial fibrillation ([place])    a. Dx 08/2017; b. CHA2DS2VASc = 6-->Xarelto; c. 09/2017 Successful DCCV (second shock - 200J);   Marland Kitchen Personal history of radiation therapy 2001   left breast ca  . Psoriasis     Past Surgical History:  Procedure Laterality Date  . ABDOMINAL HYSTERECTOMY    . ANKLE FRACTURE SURGERY  4/08   left---hardware still in place  . 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 BREAST EXCISIONAL BIOPSY Left yrs ago   benign  . BREAST LUMPECTOMY Left 2001   f/u radiation  . CARDIOVERSION N/A 10/04/2017   Procedure: CARDIOVERSION;  Surgeon: Wellington Hampshire, MD;  Location: [facility] ORS;  Service: Cardiovascular;  Laterality: N/A;  . CARDIOVERSION N/A 12/16/2017   Procedure:  CARDIOVERSION;  Surgeon: [name], MD;  Location: [facility] ORS;  Service: Cardiovascular;  Laterality: N/A;  . CAROTID ENDARTERECTOMY Left 10/23/2004  . FRACTURE SURGERY    . OOPHORECTOMY    . SHOULDER SURGERY  6/07   left  . TONSILLECTOMY AND ADENOIDECTOMY    . TOTAL HIP ARTHROPLASTY  2004   right    There were no vitals filed for this visit.  Subjective Assessment - 04/13/18 1011    Subjective  Patient reports no major changes since the previous session. Patient states she has visitors coming into town and needs to do cooking and standing activties.     Pertinent History  Patient previously seen for balance difficulties and shoulder pain and the right.  Patient reports no falls in the past six months. Patient reports pain has not improved for the past month and has been about the same since the onset of pain.     Limitations  Walking    Patient Stated Goals  To improve balance when walking     Currently in Pain?  No/denies    Pain Onset  More than a month ago       TREATMENT Therapeutic Exercises LTRs on physioball -- x20 with green physioball Bridges in hooklying with  feet on physioball - x 20 Physioball knees to chest - x 20  Standing rotational twists with physioball - x 20 B Standing scapular retraction low rows - 2 x 15 10# Straight arm push downs - 2 x 15 10# Hip abduction at hip machine -- 2 x 10 40# Hip extension in standing on airex pad - 2 x 20 with holding UE support TRX squats -- 2 x 10   Pt demonstrates fatigue at end of treatment session   PT Education - 04/13/18 1013    Education provided  Yes    Education Details  form/technique with exercise    Person(s) Educated  Patient    Methods  Explanation;Demonstration    Comprehension  Verbalized understanding;Returned demonstration          PT Long Term Goals - 03/16/18 1018      PT LONG TERM GOAL #1   Title  Patient will be independent with HEP to continue benefits of therapy after discharge.      Baseline  Dependent with form and technique for balancing exercises; moderate cueing for balance and strengthening; 02/14/2018: Independent with HEP     Time  4    Period  Weeks    Status  Achieved      PT LONG TERM GOAL #2   Title  Patient will improve FGA to over 26/30 to indicate functional improvement in strength and balance limitations and decrease fall risk    Baseline  22/30; ; 09/01/2017: 24/30; 09/30/2017: 25/30; 11/03/2017: 25/30; 02/14/2018: deferred to next visit; 03/16/2018: 28/30    Time  8    Period  Weeks    Status  Achieved      PT LONG TERM GOAL #3   Title  Patient will improve 5xSTS to under 16 seconds without use of a pillow to indicate functional improvement of LE function and decreased fall risk    Baseline  19 sec with pillow under chair; 09/01/2017 12 seconds with use of a pillow; 09/30/2017: 10.9 sec with use of pillow; defered today secondary to increased soreness from walking the previous day    Time  8    Period  Weeks    Status  Partially Met      PT LONG TERM GOAL #4   Title  Patient will be able to balance for 10sec with SLS to improve static balance and ability to get dressed when in standing    Baseline  1 sec; 09/01/2017: 3 sec each leg; 09/30/17: 4.5; 11/03/2017: 5 sec; 02/14/2018: Deffered to next visit; 03/16/2018: 7sec     Time  8    Period  Weeks    Status  On-going      PT LONG TERM GOAL #5   Title  Patient will have a worst lumbar pain of a 3/10 over the past week to indicate functional improvement with performing standing activities and prolonged sitting.    Baseline  Worst pain: 8/10; 02/14/2018: worst pain: 7/10; 03/16/2018: 3/10    Time  6    Period  Weeks    Status  Achieved      PT LONG TERM GOAL #6   Title  Patient will be able to sit for >1 hour to improve abiltity to drive without increase in pain.    Baseline  Increased pain after driving for <81EXN; 1/70/0174: Patient reports increase in pain for 47mn; 03/16/2018: 1 hour    Time  6    Period   Weeks  Status  Achieved            Plan - 04/13/18 1056    Clinical Impression Statement  Patient demonstrates improvement with transfering from sitting to lying without increase in pain. Patient continues to demosntrate significant decrease in strength most notably with performing standing stabilization exercises. Patient will benefit from further skilled therapy to return to prior level of function.     Rehab Potential  Good    Clinical Impairments Affecting Rehab Potential  Positive: motivation; Negative: , age    PT Frequency  2x / week    PT Duration  6 weeks    PT Treatment/Interventions  ADLs/Self Care Home Management;Aquatic Therapy;Biofeedback;DME Instruction;Gait training;Stair training;Functional mobility training;Therapeutic activities;Therapeutic exercise;Balance training;Neuromuscular re-education;Patient/family education;Passive range of motion;Energy conservation;Ultrasound;Moist Heat;Iontophoresis 79m/ml Dexamethasone;Cryotherapy;Electrical Stimulation;Manual techniques;Dry needling    PT Next Visit Plan  progress core and back strengthening    PT Home Exercise Plan  see education section    Consulted and Agree with Plan of Care  Patient       Patient will benefit from skilled therapeutic intervention in order to improve the following deficits and impairments:  Pain, Increased fascial restricitons, Decreased coordination, Decreased mobility, Decreased activity tolerance, Decreased endurance, Decreased range of motion, Decreased strength, Hypomobility, Postural dysfunction, Abnormal gait, Decreased balance, Difficulty walking  Visit Diagnosis: Muscle weakness (generalized)  Acute bilateral low back pain without sciatica  History of falling     Problem List Patient Active Problem List   Diagnosis Date Noted  . Chronic bilateral low back pain without sciatica 12/31/2017  . Hypomagnesemia 09/29/2017  . Hypokalemia 09/29/2017  . Atrial fibrillation ([place]  09/10/2017  . Trapezius strain, right, initial encounter 09/10/2017  . Depression, major, single episode, mild ([place] 09/10/2017  . Chronic venous insufficiency 09/09/2017  . Lymphedema 09/09/2017  . Pain of toe of left foot 08/17/2017  . Grief 08/17/2017  . Epistaxis 07/31/2017  . Leg swelling 07/31/2017  . Dry eyes 07/06/2017  . Pain and swelling of lower extremity, right 03/29/2017  . Right shoulder pain 03/29/2017  . Diarrhea 05/18/2016  . Fall 05/01/2016  . Unintentional weight loss 12/30/2015  . Leg weakness, bilateral 12/30/2015  . Hyperkalemia 01/01/2015  . Occasional numbness/prickling/tingling of fingers and toes 12/18/2014  . Routine general medical examination at a health care facility 12/13/2013  . Macrocytic anemia 05/19/2013  . Psoriatic arthritis ([place] 02/03/2013  . Psoriasis   . Osteoarthritis, multiple sites   . Episodic mood disorder ([place] 12/01/2011  . History of breast cancer 10/28/2011  . GERD (gastroesophageal reflux disease)   . Hyperlipidemia   . Hypertension   . Osteopenia   . Carotid stenosis 05/27/2011    WBlythe Stanford PT DPT 04/13/2018, 10:59 AM  [facility] [address] [place] [address]Phone: [phone]  Fax:  [phone] Name: Dana EHLERMRN: [id number] of Birth: 8Mar 08, 1942

## 2018-04-14 ENCOUNTER — Ambulatory Visit (INDEPENDENT_AMBULATORY_CARE_PROVIDER_SITE_OTHER): Payer: Medicare Other

## 2018-04-14 DIAGNOSIS — I4589 Other specified conduction disorders: Secondary | ICD-10-CM | POA: Diagnosis not present

## 2018-04-14 NOTE — Patient Instructions (Addendum)
Medication Instructions: - Your physician recommends that you continue on your current medications as directed. Please refer to the Current Medication list given to you today.  Labwork: - none ordered  Procedures/Testing: - none ordered  Follow-Up: - Your physician wants you to follow-up in: 6 months with Dr. Klein. You will receive a reminder letter in the mail two months in advance. If you don't receive a letter, please call our office to schedule the follow-up appointment.   Any Additional Special Instructions Will Be Listed Below (If Applicable).     If you need a refill on your cardiac medications before your next appointment, please call your pharmacy.   

## 2018-04-18 ENCOUNTER — Ambulatory Visit: Payer: Medicare Other

## 2018-04-18 DIAGNOSIS — Z9181 History of falling: Secondary | ICD-10-CM

## 2018-04-18 DIAGNOSIS — M6281 Muscle weakness (generalized): Secondary | ICD-10-CM

## 2018-04-18 DIAGNOSIS — M545 Low back pain, unspecified: Secondary | ICD-10-CM

## 2018-04-18 NOTE — Therapy (Addendum)
[facility] [address], [place], [address] Phone: [phone]   Fax:  [phone]  Physical Therapy Treatment/Progress Note  Patient Details  Name: [name] MRN: [id number] Date of Birth: [birth year] Referring Provider (PT): Caryl Bis MD  Dates of reporting period 03/23/18 to 04/18/18  Encounter Date: 04/18/2018  PT End of Session - 04/18/18 1613    Visit Number  27    Number of Visits  38    Date for PT Re-Evaluation  04/13/18    Authorization Type  1/10    PT Start Time  1515    PT Stop Time  1600    PT Time Calculation (min)  45 min    Activity Tolerance  Patient tolerated treatment well    Behavior During Therapy  [facility] for tasks assessed/performed       Past Medical History:  Diagnosis Date  . (HFpEF) heart failure with preserved ejection fraction ([place])    a. 08/2017 Echo: EF 55-60%, no rwma, mild MR, mildly dil LA, nl RV fxn.  . Breast cancer ([place]) 2001   left breast  . Cancer ([place]) 2001   left breast ca  . Carotid arterial disease ([place])    a. 10/2004 s/p L CEA; b. 12/2015 Carotid U/S: RICA 1-39%; b. LICA patent CEA site.  Marland Kitchen GERD (gastroesophageal reflux disease)   . Hyperlipidemia   . Hypertension   . Osteoarthritis, multiple sites   . Osteopenia   . Persistent atrial fibrillation ([place])    a. Dx 08/2017; b. CHA2DS2VASc = 6-->Xarelto; c. 09/2017 Successful DCCV (second shock - 200J);   Marland Kitchen Personal history of radiation therapy 2001   left breast ca  . Psoriasis     Past Surgical History:  Procedure Laterality Date  . ABDOMINAL HYSTERECTOMY    . ANKLE FRACTURE SURGERY  4/08   left---hardware still in place  . 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 BREAST EXCISIONAL BIOPSY Left yrs ago   benign  . BREAST LUMPECTOMY Left 2001   f/u radiation  . CARDIOVERSION N/A 10/04/2017   Procedure: CARDIOVERSION;  Surgeon: Wellington Hampshire, MD;  Location: [facility] ORS;  Service: Cardiovascular;   Laterality: N/A;  . CARDIOVERSION N/A 12/16/2017   Procedure: CARDIOVERSION;  Surgeon: [name], MD;  Location: [facility] ORS;  Service: Cardiovascular;  Laterality: N/A;  . CAROTID ENDARTERECTOMY Left 10/23/2004  . FRACTURE SURGERY    . OOPHORECTOMY    . SHOULDER SURGERY  6/07   left  . TONSILLECTOMY AND ADENOIDECTOMY    . TOTAL HIP ARTHROPLASTY  2004   right    There were no vitals filed for this visit.  Subjective Assessment - 04/18/18 1540    Subjective  Patient reports she continues to have increased pain in the mornings but states the pain decreases as the day continues. Patient states she had a stress tests which she reports her heart responded normally to the test. Patient states overall her pain is improving and her balance is as well. However, she reports she continues to have difficulty with picking up objects from uneven surfaces.       Pertinent History  Patient previously seen for balance difficulties and shoulder pain and the right.  Patient reports no falls in the past six months. Patient reports pain has not improved for the past month and has been about the same since the onset of pain.     Limitations  Walking  Patient Stated Goals  To improve balance when walking     Currently in Pain?  No/denies    Pain Onset  More than a month ago       TREATMENT Therapeutic Exercises LTRs on physioball -- x20 with green physioball Bridges in hooklying with feet on plinth- x 20 Physioball knees to chest - x 20  Single leg stance with intermittent UE support - x 20  TRX squats -- 2 x 10 Squatting on airex pad pick up cones - 2 x 6  Standing rotational twists with physioball - x 20 B   Pt demonstrates fatigue at end of treatment session   PT Education - 04/18/18 1613    Education provided  Yes    Education Details  form/technique with exercise    Person(s) Educated  Patient    Methods  Explanation;Demonstration    Comprehension  Verbalized understanding;Returned  demonstration          PT Long Term Goals - 04/18/18 1617      PT LONG TERM GOAL #1   Title  Patient will be independent with HEP to continue benefits of therapy after discharge.     Baseline  Dependent with form and technique for balancing exercises; moderate cueing for balance and strengthening; 02/14/2018: Independent with HEP     Time  4    Period  Weeks    Status  Achieved      PT LONG TERM GOAL #2   Title  Patient will improve FGA to over 26/30 to indicate functional improvement in strength and balance limitations and decrease fall risk    Baseline  22/30; ; 09/01/2017: 24/30; 09/30/2017: 25/30; 11/03/2017: 25/30; 02/14/2018: deferred to next visit; 03/16/2018: 28/30    Time  8    Period  Weeks    Status  Achieved      PT LONG TERM GOAL #3   Title  Patient will improve 5xSTS to under 16 seconds without use of a pillow to indicate functional improvement of LE function and decreased fall risk    Baseline  19 sec with pillow under chair; 09/01/2017 12 seconds with use of a pillow; 09/30/2017: 10.9 sec with use of pillow; defered today secondary to increased soreness from walking the previous day    Time  8    Period  Weeks    Status  Partially Met      PT LONG TERM GOAL #4   Title  Patient will be able to balance for 10sec with SLS to improve static balance and ability to get dressed when in standing    Baseline  1 sec; 09/01/2017: 3 sec each leg; 09/30/17: 4.5; 11/03/2017: 5 sec; 02/14/2018: Deffered to next visit; 03/16/2018: 7sec; 04/18/2018:7 sec    Time  8    Period  Weeks    Status  On-going      PT LONG TERM GOAL #5   Title  Patient will have a worst lumbar pain of a 3/10 over the past week to indicate functional improvement with performing standing activities and prolonged sitting.    Baseline  Worst pain: 8/10; 02/14/2018: worst pain: 7/10; 03/16/2018: 3/10    Time  6    Period  Weeks    Status  Achieved      Additional Long Term Goals   Additional Long Term Goals  Yes       PT LONG TERM GOAL #6   Title  Patient will be able to sit for >1 hour  to improve abiltity to drive without increase in pain.    Baseline  Increased pain after driving for <65YYT; 0/35/4656: Patient reports increase in pain for 70mn; 03/16/2018: 1 hour    Time  6    Period  Weeks    Status  Achieved      PT LONG TERM GOAL #7   Title  Patient will improve 693mWT to over 140059fo improve ability to perform endurance based acitivities such as walking to the mailbox without increase in fatigue    Baseline  1120f17f Time  6    Period  Weeks    Status  New            Plan - 04/18/18 1615    Clinical Impression Statement  Patient is making progress towards long term goals with greater overall strength with exercises. However, she demonstrates difficulty with 6min6mand difficulty with lifting items from the floor without feeling of loss of balance. Patient will benefit from further skilled therapy to return to prior level of function.     Rehab Potential  Good    Clinical Impairments Affecting Rehab Potential  Positive: motivation; Negative: , age    PT Frequency  2x / week    PT Duration  6 weeks    PT Treatment/Interventions  ADLs/Self Care Home Management;Aquatic Therapy;Biofeedback;DME Instruction;Gait training;Stair training;Functional mobility training;Therapeutic activities;Therapeutic exercise;Balance training;Neuromuscular re-education;Patient/family education;Passive range of motion;Energy conservation;Ultrasound;Moist Heat;Iontophoresis 4mg/m36mexamethasone;Cryotherapy;Electrical Stimulation;Manual techniques;Dry needling    PT Next Visit Plan  progress core and back strengthening    PT Home Exercise Plan  see education section    Consulted and Agree with Plan of Care  Patient       Patient will benefit from skilled therapeutic intervention in order to improve the following deficits and impairments:  Pain, Increased fascial restricitons, Decreased coordination, Decreased  mobility, Decreased activity tolerance, Decreased endurance, Decreased range of motion, Decreased strength, Hypomobility, Postural dysfunction, Abnormal gait, Decreased balance, Difficulty walking  Visit Diagnosis: Muscle weakness (generalized)  Acute bilateral low back pain without sciatica  History of falling     Problem List Patient Active Problem List   Diagnosis Date Noted  . Chronic bilateral low back pain without sciatica 12/31/2017  . Hypomagnesemia 09/29/2017  . Hypokalemia 09/29/2017  . Atrial fibrillation (HCC) 0[place]2/2019  . Trapezius strain, right, initial encounter 09/10/2017  . Depression, major, single episode, mild (HCC) 0[place]/2019  . Chronic venous insufficiency 09/09/2017  . Lymphedema 09/09/2017  . Pain of toe of left foot 08/17/2017  . Grief 08/17/2017  . Epistaxis 07/31/2017  . Leg swelling 07/31/2017  . Dry eyes 07/06/2017  . Pain and swelling of lower extremity, right 03/29/2017  . Right shoulder pain 03/29/2017  . Diarrhea 05/18/2016  . Fall 05/01/2016  . Unintentional weight loss 12/30/2015  . Leg weakness, bilateral 12/30/2015  . Hyperkalemia 01/01/2015  . Occasional numbness/prickling/tingling of fingers and toes 12/18/2014  . Routine general medical examination at a health care facility 12/13/2013  . Macrocytic anemia 05/19/2013  . Psoriatic arthritis (HCC) 0[place]8/2014  . Psoriasis   . Osteoarthritis, multiple sites   . Episodic mood disorder (HCC) 0[place]4/2013  . History of breast cancer 10/28/2011  . GERD (gastroesophageal reflux disease)   . Hyperlipidemia   . Hypertension   . Osteopenia   . Carotid stenosis 05/27/2011    WesleyBlythe StanfordPT 04/18/2018, 4:25 PM  [facility] [address]:  [phone]   Fax:  [phone]  Name: [name] MRN: [id number] Date of Birth: 06-27-1941

## 2018-04-18 NOTE — Addendum Note (Signed)
Addended by: Blain Pais on: 04/18/2018 04:30 PM   Modules accepted: Orders

## 2018-04-20 ENCOUNTER — Ambulatory Visit: Payer: Medicare Other | Attending: Family Medicine

## 2018-04-20 DIAGNOSIS — M545 Low back pain, unspecified: Secondary | ICD-10-CM

## 2018-04-20 DIAGNOSIS — M6281 Muscle weakness (generalized): Secondary | ICD-10-CM | POA: Diagnosis not present

## 2018-04-20 DIAGNOSIS — Z9181 History of falling: Secondary | ICD-10-CM | POA: Insufficient documentation

## 2018-04-20 NOTE — Therapy (Signed)
[facility] [address] [place], [address] Phone: [phone]   Fax:  [phone]  Physical Therapy Treatment  Patient Details  Name: [name] MRN: [id number] Date of Birth: [birth year] Referring Provider (PT): Caryl Bis MD   Encounter Date: 04/20/2018  PT End of Session - 04/20/18 1033    Visit Number  28    Number of Visits  38    Date for PT Re-Evaluation  05/16/18    Authorization Type  2/10    PT Start Time  0950    PT Stop Time  1030    PT Time Calculation (min)  40 min    Activity Tolerance  Patient tolerated treatment well    Behavior During Therapy  [facility] for tasks assessed/performed       Past Medical History:  Diagnosis Date  . (HFpEF) heart failure with preserved ejection fraction ([place])    a. 08/2017 Echo: EF 55-60%, no rwma, mild MR, mildly dil LA, nl RV fxn.  . Breast cancer ([place]) 2001   left breast  . Cancer ([place]) 2001   left breast ca  . Carotid arterial disease ([place])    a. 10/2004 s/p L CEA; b. 12/2015 Carotid U/S: RICA 1-39%; b. LICA patent CEA site.  Marland Kitchen GERD (gastroesophageal reflux disease)   . Hyperlipidemia   . Hypertension   . Osteoarthritis, multiple sites   . Osteopenia   . Persistent atrial fibrillation ([place])    a. Dx 08/2017; b. CHA2DS2VASc = 6-->Xarelto; c. 09/2017 Successful DCCV (second shock - 200J);   Marland Kitchen Personal history of radiation therapy 2001   left breast ca  . Psoriasis     Past Surgical History:  Procedure Laterality Date  . ABDOMINAL HYSTERECTOMY    . ANKLE FRACTURE SURGERY  4/08   left---hardware still in place  . 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 BREAST EXCISIONAL BIOPSY Left yrs ago   benign  . BREAST LUMPECTOMY Left 2001   f/u radiation  . CARDIOVERSION N/A 10/04/2017   Procedure: CARDIOVERSION;  Surgeon: Wellington Hampshire, MD;  Location: [facility] ORS;  Service: Cardiovascular;  Laterality: N/A;  . CARDIOVERSION N/A 12/16/2017    Procedure: CARDIOVERSION;  Surgeon: [name], MD;  Location: [facility] ORS;  Service: Cardiovascular;  Laterality: N/A;  . CAROTID ENDARTERECTOMY Left 10/23/2004  . FRACTURE SURGERY    . OOPHORECTOMY    . SHOULDER SURGERY  6/07   left  . TONSILLECTOMY AND ADENOIDECTOMY    . TOTAL HIP ARTHROPLASTY  2004   right    There were no vitals filed for this visit.  Subjective Assessment - 04/20/18 1031    Subjective  Patient reports she has been performing exercises and states she feels she is improving overall.     Pertinent History  Patient previously seen for balance difficulties and shoulder pain and the right.  Patient reports no falls in the past six months. Patient reports pain has not improved for the past month and has been about the same since the onset of pain.     Limitations  Walking    Patient Stated Goals  To improve balance when walking     Currently in Pain?  No/denies    Pain Onset  More than a month ago       TREATMENT Therapeutic Exercise: Hip abduction at hip machine - x 10, x 12 B 40# Lunges in standing with UE support - 2 x  10 B  Step ups onto 6" step - x 20  Step taps onto 12" step from airex pad  TRX sit to stands - 3 x 10 Cone taps from airex pad in standing - x 14 B  Cone pick ups from airex - x 14 B  LTRS in hooklying - x 2 minutes Knees to chest with physioball - x 2 minutes Nustep level 7 ; seat level 9 with focus on improving strengthening and endurance - 5 min  Patient demonstrates increased fatigue at the end of the session    PT Education - 04/20/18 1032    Education provided  Yes    Education Details  form/technique with exercise     Person(s) Educated  Patient    Methods  Explanation;Demonstration    Comprehension  Returned demonstration;Verbalized understanding          PT Long Term Goals - 04/18/18 1617      PT LONG TERM GOAL #1   Title  Patient will be independent with HEP to continue benefits of therapy after discharge.      Baseline  Dependent with form and technique for balancing exercises; moderate cueing for balance and strengthening; 02/14/2018: Independent with HEP     Time  4    Period  Weeks    Status  Achieved      PT LONG TERM GOAL #2   Title  Patient will improve FGA to over 26/30 to indicate functional improvement in strength and balance limitations and decrease fall risk    Baseline  22/30; ; 09/01/2017: 24/30; 09/30/2017: 25/30; 11/03/2017: 25/30; 02/14/2018: deferred to next visit; 03/16/2018: 28/30    Time  8    Period  Weeks    Status  Achieved      PT LONG TERM GOAL #3   Title  Patient will improve 5xSTS to under 16 seconds without use of a pillow to indicate functional improvement of LE function and decreased fall risk    Baseline  19 sec with pillow under chair; 09/01/2017 12 seconds with use of a pillow; 09/30/2017: 10.9 sec with use of pillow; defered today secondary to increased soreness from walking the previous day    Time  8    Period  Weeks    Status  Partially Met      PT LONG TERM GOAL #4   Title  Patient will be able to balance for 10sec with SLS to improve static balance and ability to get dressed when in standing    Baseline  1 sec; 09/01/2017: 3 sec each leg; 09/30/17: 4.5; 11/03/2017: 5 sec; 02/14/2018: Deffered to next visit; 03/16/2018: 7sec; 04/18/2018:7 sec    Time  8    Period  Weeks    Status  On-going      PT LONG TERM GOAL #5   Title  Patient will have a worst lumbar pain of a 3/10 over the past week to indicate functional improvement with performing standing activities and prolonged sitting.    Baseline  Worst pain: 8/10; 02/14/2018: worst pain: 7/10; 03/16/2018: 3/10    Time  6    Period  Weeks    Status  Achieved      Additional Long Term Goals   Additional Long Term Goals  Yes      PT LONG TERM GOAL #6   Title  Patient will be able to sit for >1 hour to improve abiltity to drive without increase in pain.    Baseline  Increased  pain after driving for <34DHW; 8/61/6837:  Patient reports increase in pain for 19mn; 03/16/2018: 1 hour    Time  6    Period  Weeks    Status  Achieved      PT LONG TERM GOAL #7   Title  Patient will improve 617mWT to over 140069fo improve ability to perform endurance based acitivities such as walking to the mailbox without increase in fatigue    Baseline  1120f57f Time  6    Period  Weeks    Status  New            Plan - 04/20/18 1149    Clinical Impression Statement  Patient demosntrates improvement with endurance with exercise performance with ability to perform greater amount of exercises during todays session with less requirement for resting breaks. Patient demonstrates decreased stability with performing squatting on airex pad requiring intermittent UE support to perform. Patient will benefit from further skilled therapy to return to prior level of function.     Rehab Potential  Good    Clinical Impairments Affecting Rehab Potential  Positive: motivation; Negative: , age    PT Frequency  2x / week    PT Duration  6 weeks    PT Treatment/Interventions  ADLs/Self Care Home Management;Aquatic Therapy;Biofeedback;DME Instruction;Gait training;Stair training;Functional mobility training;Therapeutic activities;Therapeutic exercise;Balance training;Neuromuscular re-education;Patient/family education;Passive range of motion;Energy conservation;Ultrasound;Moist Heat;Iontophoresis 4mg/18mDexamethasone;Cryotherapy;Electrical Stimulation;Manual techniques;Dry needling    PT Next Visit Plan  progress core and back strengthening    PT Home Exercise Plan  see education section    Consulted and Agree with Plan of Care  Patient       Patient will benefit from skilled therapeutic intervention in order to improve the following deficits and impairments:  Pain, Increased fascial restricitons, Decreased coordination, Decreased mobility, Decreased activity tolerance, Decreased endurance, Decreased range of motion, Decreased strength,  Hypomobility, Postural dysfunction, Abnormal gait, Decreased balance, Difficulty walking  Visit Diagnosis: Muscle weakness (generalized)  Acute bilateral low back pain without sciatica  History of falling     Problem List Patient Active Problem List   Diagnosis Date Noted  . Chronic bilateral low back pain without sciatica 12/31/2017  . Hypomagnesemia 09/29/2017  . Hypokalemia 09/29/2017  . Atrial fibrillation (HCC) [place]22/2019  . Trapezius strain, right, initial encounter 09/10/2017  . Depression, major, single episode, mild (HCC) [place]22/2019  . Chronic venous insufficiency 09/09/2017  . Lymphedema 09/09/2017  . Pain of toe of left foot 08/17/2017  . Grief 08/17/2017  . Epistaxis 07/31/2017  . Leg swelling 07/31/2017  . Dry eyes 07/06/2017  . Pain and swelling of lower extremity, right 03/29/2017  . Right shoulder pain 03/29/2017  . Diarrhea 05/18/2016  . Fall 05/01/2016  . Unintentional weight loss 12/30/2015  . Leg weakness, bilateral 12/30/2015  . Hyperkalemia 01/01/2015  . Occasional numbness/prickling/tingling of fingers and toes 12/18/2014  . Routine general medical examination at a health care facility 12/13/2013  . Macrocytic anemia 05/19/2013  . Psoriatic arthritis (HCC) [place]18/2014  . Psoriasis   . Osteoarthritis, multiple sites   . Episodic mood disorder (HCC) [place]14/2013  . History of breast cancer 10/28/2011  . GERD (gastroesophageal reflux disease)   . Hyperlipidemia   . Hypertension   . Osteopenia   . Carotid stenosis 05/27/2011    WesleBlythe StanfordDPT 04/20/2018, 11:53 AM  [facility] [address] [id number]: [phone]:  [phone]  Name: CRISTLE JARED MRN: [id number] Date of Birth: 1940/12/16

## 2018-04-25 ENCOUNTER — Ambulatory Visit: Payer: Medicare Other

## 2018-04-25 ENCOUNTER — Telehealth: Payer: Self-pay | Admitting: Internal Medicine

## 2018-04-25 DIAGNOSIS — Z9181 History of falling: Secondary | ICD-10-CM

## 2018-04-25 DIAGNOSIS — M6281 Muscle weakness (generalized): Secondary | ICD-10-CM

## 2018-04-25 DIAGNOSIS — M545 Low back pain, unspecified: Secondary | ICD-10-CM

## 2018-04-25 NOTE — Telephone Encounter (Signed)
I called and spoke with the patient. She states that she is having a hard time getting the tops of the Pradaxa bottles open. The pharmacy states that they cannot put the easy open tops on the pradaxa as it has to be air tight. The patient states the pharmacy recommended that she ask if generic effient would be an option.  I advised the patient that effient and pradaxa are 2 different classes of drug and due to her a-fib, she would need to be on the pradaxa.   The patient states she will continue pradaxa. She has been having the pharmacy open the top of the medication for her and just leaving it loose. She states she will continue to do this.

## 2018-04-25 NOTE — Therapy (Signed)
[facility] [address], [place], [address] Phone: [phone]   Fax:  [phone]  Physical Therapy Treatment  Patient Details  Name: [name] MRN: [id number] Date of Birth: [birth year] Referring Provider (PT): Caryl Bis MD   Encounter Date: 04/25/2018  PT End of Session - 04/25/18 1311    Visit Number  29    Number of Visits  38    Date for PT Re-Evaluation  05/16/18    Authorization Type  3/10    PT Start Time  1302    PT Stop Time  1345    PT Time Calculation (min)  43 min    Activity Tolerance  Patient tolerated treatment well    Behavior During Therapy  [facility] for tasks assessed/performed       Past Medical History:  Diagnosis Date  . (HFpEF) heart failure with preserved ejection fraction ([place])    a. 08/2017 Echo: EF 55-60%, no rwma, mild MR, mildly dil LA, nl RV fxn.  . Breast cancer ([place]) 2001   left breast  . Cancer ([place]) 2001   left breast ca  . Carotid arterial disease ([place])    a. 10/2004 s/p L CEA; b. 12/2015 Carotid U/S: RICA 1-39%; b. LICA patent CEA site.  Marland Kitchen GERD (gastroesophageal reflux disease)   . Hyperlipidemia   . Hypertension   . Osteoarthritis, multiple sites   . Osteopenia   . Persistent atrial fibrillation    a. Dx 08/2017; b. CHA2DS2VASc = 6-->Xarelto; c. 09/2017 Successful DCCV (second shock - 200J);   Marland Kitchen Personal history of radiation therapy 2001   left breast ca  . Psoriasis     Past Surgical History:  Procedure Laterality Date  . ABDOMINAL HYSTERECTOMY    . ANKLE FRACTURE SURGERY  4/08   left---hardware still in place  . 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 BREAST EXCISIONAL BIOPSY Left yrs ago   benign  . BREAST LUMPECTOMY Left 2001   f/u radiation  . CARDIOVERSION N/A 10/04/2017   Procedure: CARDIOVERSION;  Surgeon: Wellington Hampshire, MD;  Location: [facility] ORS;  Service: Cardiovascular;  Laterality: N/A;  . CARDIOVERSION N/A 12/16/2017   Procedure:  CARDIOVERSION;  Surgeon: [name], MD;  Location: [facility] ORS;  Service: Cardiovascular;  Laterality: N/A;  . CAROTID ENDARTERECTOMY Left 10/23/2004  . FRACTURE SURGERY    . OOPHORECTOMY    . SHOULDER SURGERY  6/07   left  . TONSILLECTOMY AND ADENOIDECTOMY    . TOTAL HIP ARTHROPLASTY  2004   right    There were no vitals filed for this visit.  Subjective Assessment - 04/25/18 1307    Subjective  Patient reports no major changes since the previous session. Patient states she has been performing more activities at home and in the community.     Pertinent History  Patient previously seen for balance difficulties and shoulder pain and the right.  Patient reports no falls in the past six months. Patient reports pain has not improved for the past month and has been about the same since the onset of pain.     Limitations  Walking    Patient Stated Goals  To improve balance when walking     Currently in Pain?  No/denies    Pain Onset  More than a month ago       TREATMENT Therapeutic Exercise: LTRS in hooklying - x 2 minutes Knees to chest with physioball - x  2 minutes Side stepping up and over airex pad with YTB around knees - x 20  Cone pick ups from airex - 2 x 14 B Step ups onto 6" step - 2 x 10 Side stepping across balance stones - 2 x 10  TRX sit to stands - 2 x 10   Patient demonstrates increased fatigue at the end of the session    PT Education - 04/25/18 1311    Education provided  Yes    Education Details  form/technique with exercise    Person(s) Educated  Patient    Methods  Explanation;Demonstration    Comprehension  Verbalized understanding;Returned demonstration          PT Long Term Goals - 04/18/18 1617      PT LONG TERM GOAL #1   Title  Patient will be independent with HEP to continue benefits of therapy after discharge.     Baseline  Dependent with form and technique for balancing exercises; moderate cueing for balance and strengthening; 02/14/2018:  Independent with HEP     Time  4    Period  Weeks    Status  Achieved      PT LONG TERM GOAL #2   Title  Patient will improve FGA to over 26/30 to indicate functional improvement in strength and balance limitations and decrease fall risk    Baseline  22/30; ; 09/01/2017: 24/30; 09/30/2017: 25/30; 11/03/2017: 25/30; 02/14/2018: deferred to next visit; 03/16/2018: 28/30    Time  8    Period  Weeks    Status  Achieved      PT LONG TERM GOAL #3   Title  Patient will improve 5xSTS to under 16 seconds without use of a pillow to indicate functional improvement of LE function and decreased fall risk    Baseline  19 sec with pillow under chair; 09/01/2017 12 seconds with use of a pillow; 09/30/2017: 10.9 sec with use of pillow; defered today secondary to increased soreness from walking the previous day    Time  8    Period  Weeks    Status  Partially Met      PT LONG TERM GOAL #4   Title  Patient will be able to balance for 10sec with SLS to improve static balance and ability to get dressed when in standing    Baseline  1 sec; 09/01/2017: 3 sec each leg; 09/30/17: 4.5; 11/03/2017: 5 sec; 02/14/2018: Deffered to next visit; 03/16/2018: 7sec; 04/18/2018:7 sec    Time  8    Period  Weeks    Status  On-going      PT LONG TERM GOAL #5   Title  Patient will have a worst lumbar pain of a 3/10 over the past week to indicate functional improvement with performing standing activities and prolonged sitting.    Baseline  Worst pain: 8/10; 02/14/2018: worst pain: 7/10; 03/16/2018: 3/10    Time  6    Period  Weeks    Status  Achieved      Additional Long Term Goals   Additional Long Term Goals  Yes      PT LONG TERM GOAL #6   Title  Patient will be able to sit for >1 hour to improve abiltity to drive without increase in pain.    Baseline  Increased pain after driving for <69GEX; 12/15/4130: Patient reports increase in pain for 34mn; 03/16/2018: 1 hour    Time  6    Period  Weeks  Status  Achieved      PT LONG  TERM GOAL #7   Title  Patient will improve 46mnWT to over 14052fto improve ability to perform endurance based acitivities such as walking to the mailbox without increase in fatigue    Baseline  112057f  Time  6    Period  Weeks    Status  New            Plan - 04/25/18 1326    Clinical Impression Statement  Continued to focus on improving endurance with balance exercises to improve endurance and improve ability to ambulate for prolonged periods of time. Patient demonstrates improvement with squatting with ability to perform greater amount of exercises compared to the previous session. Patient will benefit from further skilled therapy to return to prior level of function.     Rehab Potential  Good    Clinical Impairments Affecting Rehab Potential  Positive: motivation; Negative: , age    PT Frequency  2x / week    PT Duration  6 weeks    PT Treatment/Interventions  ADLs/Self Care Home Management;Aquatic Therapy;Biofeedback;DME Instruction;Gait training;Stair training;Functional mobility training;Therapeutic activities;Therapeutic exercise;Balance training;Neuromuscular re-education;Patient/family education;Passive range of motion;Energy conservation;Ultrasound;Moist Heat;Iontophoresis 4mg109m Dexamethasone;Cryotherapy;Electrical Stimulation;Manual techniques;Dry needling    PT Next Visit Plan  progress core and back strengthening    PT Home Exercise Plan  see education section    Consulted and Agree with Plan of Care  Patient       Patient will benefit from skilled therapeutic intervention in order to improve the following deficits and impairments:  Pain, Increased fascial restricitons, Decreased coordination, Decreased mobility, Decreased activity tolerance, Decreased endurance, Decreased range of motion, Decreased strength, Hypomobility, Postural dysfunction, Abnormal gait, Decreased balance, Difficulty walking  Visit Diagnosis: Muscle weakness (generalized)  Acute bilateral low  back pain without sciatica  History of falling     Problem List Patient Active Problem List   Diagnosis Date Noted  . Chronic bilateral low back pain without sciatica 12/31/2017  . Hypomagnesemia 09/29/2017  . Hypokalemia 09/29/2017  . Atrial fibrillation (HCC)[place]/22/2019  . Trapezius strain, right, initial encounter 09/10/2017  . Depression, major, single episode, mild (HCC)[place]/22/2019  . Chronic venous insufficiency 09/09/2017  . Lymphedema 09/09/2017  . Pain of toe of left foot 08/17/2017  . Grief 08/17/2017  . Epistaxis 07/31/2017  . Leg swelling 07/31/2017  . Dry eyes 07/06/2017  . Pain and swelling of lower extremity, right 03/29/2017  . Right shoulder pain 03/29/2017  . Diarrhea 05/18/2016  . Fall 05/01/2016  . Unintentional weight loss 12/30/2015  . Leg weakness, bilateral 12/30/2015  . Hyperkalemia 01/01/2015  . Occasional numbness/prickling/tingling of fingers and toes 12/18/2014  . Routine general medical examination at a health care facility 12/13/2013  . Macrocytic anemia 05/19/2013  . Psoriatic arthritis (HCC)[place]/18/2014  . Psoriasis   . Osteoarthritis, multiple sites   . Episodic mood disorder (HCC)[place]/14/2013  . History of breast cancer 10/28/2011  . GERD (gastroesophageal reflux disease)   . Hyperlipidemia   . Hypertension   . Osteopenia   . Carotid stenosis 05/27/2011    WeslBlythe Stanford DPT 04/25/2018, 1:49 PM  [facility] [address], [place]: [phone]:  [phone]: ChriDAMAYA Harris: [phone] of Birth: 8/29July 06, 1942

## 2018-04-25 NOTE — Telephone Encounter (Signed)
Pt c/o medication issue:  1. Name of Medication: Pradaxa   2. How are you currently taking this medication (dosage and times per day)? 150 MG - 1 capsule 2 times daily  3. Are you having a reaction (difficulty breathing--STAT)? no  4. What is your medication issue? Patient would like to know if she can change from Pradaxa to the new generic Effient - please call to discuss

## 2018-04-27 ENCOUNTER — Ambulatory Visit: Payer: Medicare Other

## 2018-04-27 DIAGNOSIS — Z9181 History of falling: Secondary | ICD-10-CM | POA: Diagnosis not present

## 2018-04-27 DIAGNOSIS — M545 Low back pain, unspecified: Secondary | ICD-10-CM

## 2018-04-27 DIAGNOSIS — M6281 Muscle weakness (generalized): Secondary | ICD-10-CM

## 2018-04-27 NOTE — Therapy (Signed)
[facility] [address], [place], [address] Phone: [phone]   Fax:  [phone]  Physical Therapy Treatment  Patient Details  Name: [name] MRN: [id number] Date of Birth: 12/05/1940 Referring Provider (PT): Caryl Bis MD   Encounter Date: 04/27/2018  PT End of Session - 04/27/18 1030    Visit Number  30    Number of Visits  38    Date for PT Re-Evaluation  05/16/18    Authorization Type  4/10    PT Start Time  1002    PT Stop Time  1045    PT Time Calculation (min)  43 min    Activity Tolerance  Patient tolerated treatment well    Behavior During Therapy  [facility] for tasks assessed/performed       Past Medical History:  Diagnosis Date  . (HFpEF) heart failure with preserved ejection fraction ([place])    a. 08/2017 Echo: EF 55-60%, no rwma, mild MR, mildly dil LA, nl RV fxn.  . Breast cancer ([place]) 2001   left breast  . Cancer ([place]) 2001   left breast ca  . Carotid arterial disease ([place])    a. 10/2004 s/p L CEA; b. 12/2015 Carotid U/S: RICA 1-39%; b. LICA patent CEA site.  Marland Kitchen GERD (gastroesophageal reflux disease)   . Hyperlipidemia   . Hypertension   . Osteoarthritis, multiple sites   . Osteopenia   . Persistent atrial fibrillation    a. Dx 08/2017; b. CHA2DS2VASc = 6-->Xarelto; c. 09/2017 Successful DCCV (second shock - 200J);   Marland Kitchen Personal history of radiation therapy 2001   left breast ca  . Psoriasis     Past Surgical History:  Procedure Laterality Date  . ABDOMINAL HYSTERECTOMY    . ANKLE FRACTURE SURGERY  4/08   left---hardware still in place  . 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 BREAST EXCISIONAL BIOPSY Left yrs ago   benign  . BREAST LUMPECTOMY Left 2001   f/u radiation  . CARDIOVERSION N/A 10/04/2017   Procedure: CARDIOVERSION;  Surgeon: Wellington Hampshire, MD;  Location: [facility] ORS;  Service: Cardiovascular;  Laterality: N/A;  . CARDIOVERSION N/A 12/16/2017   Procedure:  CARDIOVERSION;  Surgeon: [name], MD;  Location: [facility] ORS;  Service: Cardiovascular;  Laterality: N/A;  . CAROTID ENDARTERECTOMY Left 10/23/2004  . FRACTURE SURGERY    . OOPHORECTOMY    . SHOULDER SURGERY  6/07   left  . TONSILLECTOMY AND ADENOIDECTOMY    . TOTAL HIP ARTHROPLASTY  2004   right    There were no vitals filed for this visit.  Subjective Assessment - 04/27/18 1023    Subjective  Patient reports she has been having less pain with transferring from sitting to lying down. Patient reports she does not notivce a significant difference with improvement of endurance.     Pertinent History  Patient previously seen for balance difficulties and shoulder pain and the right.  Patient reports no falls in the past six months. Patient reports pain has not improved for the past month and has been about the same since the onset of pain.     Limitations  Walking    Patient Stated Goals  To improve balance when walking     Currently in Pain?  No/denies    Pain Onset  More than a month ago       TREATMENT Therapeutic Exercise: LTRS in hooklying - x 2 minutes Knees to  chest with physioball - x 2 minutes Hooklying bridges - x30 Side stepping with RTB around knees with squaating - x 20  Cone pick ups from airex - 2 x 16 B Step ups onto 6" step - 2 x 10 Side stepping along - 2 x 12  Sit to stands with TRX - 2 x 12 Backwards ambulation with single black - 2 x 12     Patient demonstrates increased fatigue at the end of the session     PT Education - 04/27/18 1030    Education provided  Yes    Education Details  form/technique with exercise    Person(s) Educated  Patient    Methods  Explanation;Demonstration    Comprehension  Verbalized understanding;Returned demonstration          PT Long Term Goals - 04/18/18 1617      PT LONG TERM GOAL #1   Title  Patient will be independent with HEP to continue benefits of therapy after discharge.     Baseline  Dependent with form  and technique for balancing exercises; moderate cueing for balance and strengthening; 02/14/2018: Independent with HEP     Time  4    Period  Weeks    Status  Achieved      PT LONG TERM GOAL #2   Title  Patient will improve FGA to over 26/30 to indicate functional improvement in strength and balance limitations and decrease fall risk    Baseline  22/30; ; 09/01/2017: 24/30; 09/30/2017: 25/30; 11/03/2017: 25/30; 02/14/2018: deferred to next visit; 03/16/2018: 28/30    Time  8    Period  Weeks    Status  Achieved      PT LONG TERM GOAL #3   Title  Patient will improve 5xSTS to under 16 seconds without use of a pillow to indicate functional improvement of LE function and decreased fall risk    Baseline  19 sec with pillow under chair; 09/01/2017 12 seconds with use of a pillow; 09/30/2017: 10.9 sec with use of pillow; defered today secondary to increased soreness from walking the previous day    Time  8    Period  Weeks    Status  Partially Met      PT LONG TERM GOAL #4   Title  Patient will be able to balance for 10sec with SLS to improve static balance and ability to get dressed when in standing    Baseline  1 sec; 09/01/2017: 3 sec each leg; 09/30/17: 4.5; 11/03/2017: 5 sec; 02/14/2018: Deffered to next visit; 03/16/2018: 7sec; 04/18/2018:7 sec    Time  8    Period  Weeks    Status  On-going      PT LONG TERM GOAL #5   Title  Patient will have a worst lumbar pain of a 3/10 over the past week to indicate functional improvement with performing standing activities and prolonged sitting.    Baseline  Worst pain: 8/10; 02/14/2018: worst pain: 7/10; 03/16/2018: 3/10    Time  6    Period  Weeks    Status  Achieved      Additional Long Term Goals   Additional Long Term Goals  Yes      PT LONG TERM GOAL #6   Title  Patient will be able to sit for >1 hour to improve abiltity to drive without increase in pain.    Baseline  Increased pain after driving for <08UPJ; 0/31/5945: Patient reports increase in  pain for  82mn; 03/16/2018: 1 hour    Time  6    Period  Weeks    Status  Achieved      PT LONG TERM GOAL #7   Title  Patient will improve 661mWT to over 140054fo improve ability to perform endurance based acitivities such as walking to the mailbox without increase in fatigue    Baseline  [id number] Time  6    Period  Weeks    Status  New            Plan - 04/27/18 1045    Clinical Impression Statement  Continued to focus on improving muscular endurance and strength with exercises today. Patient demonstrates increased fatigue after performing exercises in standing indicating poor endurance and strength. Patient will benefit from further skilled therapy to return to prior level of function.     Rehab Potential  Good    Clinical Impairments Affecting Rehab Potential  Positive: motivation; Negative: , age    PT Frequency  2x / week    PT Duration  6 weeks    PT Treatment/Interventions  ADLs/Self Care Home Management;Aquatic Therapy;Biofeedback;DME Instruction;Gait training;Stair training;Functional mobility training;Therapeutic activities;Therapeutic exercise;Balance training;Neuromuscular re-education;Patient/family education;Passive range of motion;Energy conservation;Ultrasound;Moist Heat;Iontophoresis 4mg/44mDexamethasone;Cryotherapy;Electrical Stimulation;Manual techniques;Dry needling    PT Next Visit Plan  progress core and back strengthening    PT Home Exercise Plan  see education section    Consulted and Agree with Plan of Care  Patient       Patient will benefit from skilled therapeutic intervention in order to improve the following deficits and impairments:  Pain, Increased fascial restricitons, Decreased coordination, Decreased mobility, Decreased activity tolerance, Decreased endurance, Decreased range of motion, Decreased strength, Hypomobility, Postural dysfunction, Abnormal gait, Decreased balance, Difficulty walking  Visit Diagnosis: Muscle weakness  (generalized)  Acute bilateral low back pain without sciatica  History of falling     Problem List Patient Active Problem List   Diagnosis Date Noted  . Chronic bilateral low back pain without sciatica 12/31/2017  . Hypomagnesemia 09/29/2017  . Hypokalemia 09/29/2017  . Atrial fibrillation (HCC) [place]22/2019  . Trapezius strain, right, initial encounter 09/10/2017  . Depression, major, single episode, mild (HCC) [place]22/2019  . Chronic venous insufficiency 09/09/2017  . Lymphedema 09/09/2017  . Pain of toe of left foot 08/17/2017  . Grief 08/17/2017  . Epistaxis 07/31/2017  . Leg swelling 07/31/2017  . Dry eyes 07/06/2017  . Pain and swelling of lower extremity, right 03/29/2017  . Right shoulder pain 03/29/2017  . Diarrhea 05/18/2016  . Fall 05/01/2016  . Unintentional weight loss 12/30/2015  . Leg weakness, bilateral 12/30/2015  . Hyperkalemia 01/01/2015  . Occasional numbness/prickling/tingling of fingers and toes 12/18/2014  . Routine general medical examination at a health care facility 12/13/2013  . Macrocytic anemia 05/19/2013  . Psoriatic arthritis (HCC) [place]18/2014  . Psoriasis   . Osteoarthritis, multiple sites   . Episodic mood disorder (HCC) [place]14/2013  . History of breast cancer 10/28/2011  . GERD (gastroesophageal reflux disease)   . Hyperlipidemia   . Hypertension   . Osteopenia   . Carotid stenosis 05/27/2011    WesleBlythe StanfordDPT 04/27/2018, 1:22 PM  [facility] [address] [id number]: [phone]:  [phone]: ChrisCANDYCE GAMBINO [phone] of Birth: [birth year]

## 2018-05-02 ENCOUNTER — Ambulatory Visit: Payer: Medicare Other

## 2018-05-02 DIAGNOSIS — M545 Low back pain, unspecified: Secondary | ICD-10-CM

## 2018-05-02 DIAGNOSIS — Z9181 History of falling: Secondary | ICD-10-CM | POA: Diagnosis not present

## 2018-05-02 DIAGNOSIS — M6281 Muscle weakness (generalized): Secondary | ICD-10-CM | POA: Diagnosis not present

## 2018-05-02 NOTE — Therapy (Signed)
[facility] [address], [place], [address] Phone: [phone]   Fax:  [phone]  Physical Therapy Treatment  Patient Details  Name: [name] MRN: [id number] Date of Birth: June 30, 1941 Referring Provider (PT): Caryl Bis MD   Encounter Date: 05/02/2018  PT End of Session - 05/02/18 1443    Visit Number  31    Number of Visits  38    Date for PT Re-Evaluation  05/16/18    Authorization Type  4/10    PT Start Time  1400    PT Stop Time  1445    PT Time Calculation (min)  45 min    Activity Tolerance  Patient tolerated treatment well    Behavior During Therapy  [facility] for tasks assessed/performed       Past Medical History:  Diagnosis Date  . (HFpEF) heart failure with preserved ejection fraction ([place])    a. 08/2017 Echo: EF 55-60%, no rwma, mild MR, mildly dil LA, nl RV fxn.  . Breast cancer ([place]) 2001   left breast  . Cancer ([place]) 2001   left breast ca  . Carotid arterial disease ([place])    a. 10/2004 s/p L CEA; b. 12/2015 Carotid U/S: RICA 1-39%; b. LICA patent CEA site.  Marland Kitchen GERD (gastroesophageal reflux disease)   . Hyperlipidemia   . Hypertension   . Osteoarthritis, multiple sites   . Osteopenia   . Persistent atrial fibrillation    a. Dx 08/2017; b. CHA2DS2VASc = 6-->Xarelto; c. 09/2017 Successful DCCV (second shock - 200J);   Marland Kitchen Personal history of radiation therapy 2001   left breast ca  . Psoriasis     Past Surgical History:  Procedure Laterality Date  . ABDOMINAL HYSTERECTOMY    . ANKLE FRACTURE SURGERY  4/08   left---hardware still in place  . 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 BREAST EXCISIONAL BIOPSY Left yrs ago   benign  . BREAST LUMPECTOMY Left 2001   f/u radiation  . CARDIOVERSION N/A 10/04/2017   Procedure: CARDIOVERSION;  Surgeon: Wellington Hampshire, MD;  Location: [facility] ORS;  Service: Cardiovascular;  Laterality: N/A;  . CARDIOVERSION N/A 12/16/2017   Procedure:  CARDIOVERSION;  Surgeon: [name], MD;  Location: [facility] ORS;  Service: Cardiovascular;  Laterality: N/A;  . CAROTID ENDARTERECTOMY Left 10/23/2004  . FRACTURE SURGERY    . OOPHORECTOMY    . SHOULDER SURGERY  6/07   left  . TONSILLECTOMY AND ADENOIDECTOMY    . TOTAL HIP ARTHROPLASTY  2004   right    There were no vitals filed for this visit.  Subjective Assessment - 05/02/18 1439    Subjective  Pt reports decreased pain and increased ease with transferring in and out of bed since last visit.    Pertinent History  Patient previously seen for balance difficulties and shoulder pain and the right.  Patient reports no falls in the past six months. Patient reports pain has not improved for the past month and has been about the same since the onset of pain.     Limitations  Walking    Patient Stated Goals  To improve balance when walking     Currently in Pain?  No/denies    Pain Onset  More than a month ago        PT Education - 05/02/18 1442    Education Details  Pt educated on form/technique with exercise.     Person(s) Educated  Patient    Methods  Explanation;Demonstration    Comprehension  Verbalized understanding;Returned demonstration      TREATMENT Therapeutic Exercise: LTRS in hooklying - x 2 minutes Knees to chest with physioball - x 2 minutes Hooklying bridges - x30 Cone pick ups from airex - 2 x 16 B Cone pick ups on uneven grassy terrain 9x Ascending hillside on uneven terrain 1x Forward and lateral step ups onto 4" step with airex on top - 2 x 10 Sit to stands with TRX - 2 x 12 Standing hip abduction at hip machine B  2x10  Patient demonstrates increased fatigue at the end of the session    PT Long Term Goals - 04/18/18 1617      PT LONG TERM GOAL #1   Title  Patient will be independent with HEP to continue benefits of therapy after discharge.     Baseline  Dependent with form and technique for balancing exercises; moderate cueing for balance and  strengthening; 02/14/2018: Independent with HEP     Time  4    Period  Weeks    Status  Achieved      PT LONG TERM GOAL #2   Title  Patient will improve FGA to over 26/30 to indicate functional improvement in strength and balance limitations and decrease fall risk    Baseline  22/30; ; 09/01/2017: 24/30; 09/30/2017: 25/30; 11/03/2017: 25/30; 02/14/2018: deferred to next visit; 03/16/2018: 28/30    Time  8    Period  Weeks    Status  Achieved      PT LONG TERM GOAL #3   Title  Patient will improve 5xSTS to under 16 seconds without use of a pillow to indicate functional improvement of LE function and decreased fall risk    Baseline  19 sec with pillow under chair; 09/01/2017 12 seconds with use of a pillow; 09/30/2017: 10.9 sec with use of pillow; defered today secondary to increased soreness from walking the previous day    Time  8    Period  Weeks    Status  Partially Met      PT LONG TERM GOAL #4   Title  Patient will be able to balance for 10sec with SLS to improve static balance and ability to get dressed when in standing    Baseline  1 sec; 09/01/2017: 3 sec each leg; 09/30/17: 4.5; 11/03/2017: 5 sec; 02/14/2018: Deffered to next visit; 03/16/2018: 7sec; 04/18/2018:7 sec    Time  8    Period  Weeks    Status  On-going      PT LONG TERM GOAL #5   Title  Patient will have a worst lumbar pain of a 3/10 over the past week to indicate functional improvement with performing standing activities and prolonged sitting.    Baseline  Worst pain: 8/10; 02/14/2018: worst pain: 7/10; 03/16/2018: 3/10    Time  6    Period  Weeks    Status  Achieved      Additional Long Term Goals   Additional Long Term Goals  Yes      PT LONG TERM GOAL #6   Title  Patient will be able to sit for >1 hour to improve abiltity to drive without increase in pain.    Baseline  Increased pain after driving for <85IDP; 03/12/2352: Patient reports increase in pain for 76mn; 03/16/2018: 1 hour    Time  6    Period  Weeks    Status   Achieved  PT LONG TERM GOAL #7   Title  Patient will improve 60mnWT to over 14054fto improve ability to perform endurance based acitivities such as walking to the mailbox without increase in fatigue    Baseline  112027f  Time  6    Period  Weeks    Status  New            Plan - 05/02/18 1444    Clinical Impression Statement  Pt demo progress towards pain reduction goals but continues to demo endurance, balance and strength deficiencies. Pt will benefit from skilled therapy to return to prior level of function and reduce fall risk.    Rehab Potential  Good    Clinical Impairments Affecting Rehab Potential  Positive: motivation; Negative: , age    PT Frequency  2x / week    PT Duration  6 weeks    PT Treatment/Interventions  ADLs/Self Care Home Management;Aquatic Therapy;Biofeedback;DME Instruction;Gait training;Stair training;Functional mobility training;Therapeutic activities;Therapeutic exercise;Balance training;Neuromuscular re-education;Patient/family education;Passive range of motion;Energy conservation;Ultrasound;Moist Heat;Iontophoresis 4mg25m Dexamethasone;Cryotherapy;Electrical Stimulation;Manual techniques;Dry needling    PT Next Visit Plan  progress core and back strengthening    PT Home Exercise Plan  see education section    Consulted and Agree with Plan of Care  Patient       Patient will benefit from skilled therapeutic intervention in order to improve the following deficits and impairments:  Pain, Increased fascial restricitons, Decreased coordination, Decreased mobility, Decreased activity tolerance, Decreased endurance, Decreased range of motion, Decreased strength, Hypomobility, Postural dysfunction, Abnormal gait, Decreased balance, Difficulty walking  Visit Diagnosis: Muscle weakness (generalized)  Acute bilateral low back pain without sciatica     Problem List Patient Active Problem List   Diagnosis Date Noted  . Chronic bilateral low back pain  without sciatica 12/31/2017  . Hypomagnesemia 09/29/2017  . Hypokalemia 09/29/2017  . Atrial fibrillation (HCC)[place]/22/2019  . Trapezius strain, right, initial encounter 09/10/2017  . Depression, major, single episode, mild (HCC)[place]/22/2019  . Chronic venous insufficiency 09/09/2017  . Lymphedema 09/09/2017  . Pain of toe of left foot 08/17/2017  . Grief 08/17/2017  . Epistaxis 07/31/2017  . Leg swelling 07/31/2017  . Dry eyes 07/06/2017  . Pain and swelling of lower extremity, right 03/29/2017  . Right shoulder pain 03/29/2017  . Diarrhea 05/18/2016  . Fall 05/01/2016  . Unintentional weight loss 12/30/2015  . Leg weakness, bilateral 12/30/2015  . Hyperkalemia 01/01/2015  . Occasional numbness/prickling/tingling of fingers and toes 12/18/2014  . Routine general medical examination at a health care facility 12/13/2013  . Macrocytic anemia 05/19/2013  . Psoriatic arthritis (HCC)[place]/18/2014  . Psoriasis   . Osteoarthritis, multiple sites   . Episodic mood disorder (HCC)[place]/14/2013  . History of breast cancer 10/28/2011  . GERD (gastroesophageal reflux disease)   . Hyperlipidemia   . Hypertension   . Osteopenia   . Carotid stenosis 05/27/2011    NataKarilyn Cota  WeslBlythe StanfordDPT 05/02/2018, 2:47 PM  [facility] [address] [id number]: [phone]:  [phone]: ChriLACIE LANDRY: [id number] of Birth: 02/2905-02-1941

## 2018-05-04 ENCOUNTER — Ambulatory Visit: Payer: Medicare Other

## 2018-05-04 DIAGNOSIS — M545 Low back pain: Secondary | ICD-10-CM | POA: Diagnosis not present

## 2018-05-04 DIAGNOSIS — M6281 Muscle weakness (generalized): Secondary | ICD-10-CM | POA: Diagnosis not present

## 2018-05-04 DIAGNOSIS — Z9181 History of falling: Secondary | ICD-10-CM

## 2018-05-04 NOTE — Therapy (Signed)
[facility] [address] [place], [address] Phone: [phone]   Fax:  [phone]  Physical Therapy Treatment  Patient Details  Name: [name] MRN: [id number] Date of Birth: [birth year] Referring Provider (PT): Caryl Bis MD   Encounter Date: 05/04/2018  PT End of Session - 05/04/18 1055    Visit Number  32    Number of Visits  38    Date for PT Re-Evaluation  05/16/18    Authorization Type  6/10    PT Start Time  1000    PT Stop Time  1045    PT Time Calculation (min)  45 min    Activity Tolerance  Patient tolerated treatment well    Behavior During Therapy  [facility] for tasks assessed/performed       Past Medical History:  Diagnosis Date  . (HFpEF) heart failure with preserved ejection fraction ([place])    a. 08/2017 Echo: EF 55-60%, no rwma, mild MR, mildly dil LA, nl RV fxn.  . Breast cancer ([place]) 2001   left breast  . Cancer ([place]) 2001   left breast ca  . Carotid arterial disease ([place])    a. 10/2004 s/p L CEA; b. 12/2015 Carotid U/S: RICA 1-39%; b. LICA patent CEA site.  Marland Kitchen GERD (gastroesophageal reflux disease)   . Hyperlipidemia   . Hypertension   . Osteoarthritis, multiple sites   . Osteopenia   . Persistent atrial fibrillation    a. Dx 08/2017; b. CHA2DS2VASc = 6-->Xarelto; c. 09/2017 Successful DCCV (second shock - 200J);   Marland Kitchen Personal history of radiation therapy 2001   left breast ca  . Psoriasis     Past Surgical History:  Procedure Laterality Date  . ABDOMINAL HYSTERECTOMY    . ANKLE FRACTURE SURGERY  4/08   left---hardware still in place  . 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 BREAST EXCISIONAL BIOPSY Left yrs ago   benign  . BREAST LUMPECTOMY Left 2001   f/u radiation  . CARDIOVERSION N/A 10/04/2017   Procedure: CARDIOVERSION;  Surgeon: Wellington Hampshire, MD;  Location: [facility] ORS;  Service: Cardiovascular;  Laterality: N/A;  . CARDIOVERSION N/A 12/16/2017   Procedure:  CARDIOVERSION;  Surgeon: [name], MD;  Location: [facility] ORS;  Service: Cardiovascular;  Laterality: N/A;  . CAROTID ENDARTERECTOMY Left 10/23/2004  . FRACTURE SURGERY    . OOPHORECTOMY    . SHOULDER SURGERY  6/07   left  . TONSILLECTOMY AND ADENOIDECTOMY    . TOTAL HIP ARTHROPLASTY  2004   right    There were no vitals filed for this visit.  Subjective Assessment - 05/04/18 1053    Subjective  Pt reports decreased pain and increased confidence walking.     Pertinent History  Patient previously seen for balance difficulties and shoulder pain and the right.  Patient reports no falls in the past six months. Patient reports pain has not improved for the past month and has been about the same since the onset of pain.     Limitations  Walking    Patient Stated Goals  To improve balance when walking     Currently in Pain?  No/denies    Pain Onset  More than a month ago        TREATMENT Therapeutic Exercise: LTRS in hooklying - x 2 minutes Knees to chest with physioball - x 2 minutes Hooklying bridges - x30 Cone pick ups from airex - 2 x  16 B Obstacle navigation over cones, compliant surfaces and DME with PT providing CGA  Airex beam with PT providing CGA x 4 Forward and lateral step ups onto 4" step with airex on top - 2 x 10 Sit to stands with TRX - 3 x 12   Patient demonstrates increased fatigue at the end of the session   PT Education - 05/04/18 1054    Education provided  Yes    Education Details  Pt educated on form/technique with exercise. Pt educated on new technique to transfer from supine to sitting. Pt educated on benefit of balance training.    Person(s) Educated  Patient    Methods  Explanation;Demonstration    Comprehension  Verbalized understanding;Returned demonstration          PT Long Term Goals - 04/18/18 1617      PT LONG TERM GOAL #1   Title  Patient will be independent with HEP to continue benefits of therapy after discharge.     Baseline   Dependent with form and technique for balancing exercises; moderate cueing for balance and strengthening; 02/14/2018: Independent with HEP     Time  4    Period  Weeks    Status  Achieved      PT LONG TERM GOAL #2   Title  Patient will improve FGA to over 26/30 to indicate functional improvement in strength and balance limitations and decrease fall risk    Baseline  22/30; ; 09/01/2017: 24/30; 09/30/2017: 25/30; 11/03/2017: 25/30; 02/14/2018: deferred to next visit; 03/16/2018: 28/30    Time  8    Period  Weeks    Status  Achieved      PT LONG TERM GOAL #3   Title  Patient will improve 5xSTS to under 16 seconds without use of a pillow to indicate functional improvement of LE function and decreased fall risk    Baseline  19 sec with pillow under chair; 09/01/2017 12 seconds with use of a pillow; 09/30/2017: 10.9 sec with use of pillow; defered today secondary to increased soreness from walking the previous day    Time  8    Period  Weeks    Status  Partially Met      PT LONG TERM GOAL #4   Title  Patient will be able to balance for 10sec with SLS to improve static balance and ability to get dressed when in standing    Baseline  1 sec; 09/01/2017: 3 sec each leg; 09/30/17: 4.5; 11/03/2017: 5 sec; 02/14/2018: Deffered to next visit; 03/16/2018: 7sec; 04/18/2018:7 sec    Time  8    Period  Weeks    Status  On-going      PT LONG TERM GOAL #5   Title  Patient will have a worst lumbar pain of a 3/10 over the past week to indicate functional improvement with performing standing activities and prolonged sitting.    Baseline  Worst pain: 8/10; 02/14/2018: worst pain: 7/10; 03/16/2018: 3/10    Time  6    Period  Weeks    Status  Achieved      Additional Long Term Goals   Additional Long Term Goals  Yes      PT LONG TERM GOAL #6   Title  Patient will be able to sit for >1 hour to improve abiltity to drive without increase in pain.    Baseline  Increased pain after driving for <44IHK; 7/42/5956: Patient  reports increase in pain for 65mn; 03/16/2018: 1  hour    Time  6    Period  Weeks    Status  Achieved      PT LONG TERM GOAL #7   Title  Patient will improve 70mnWT to over 14034fto improve ability to perform endurance based acitivities such as walking to the mailbox without increase in fatigue    Baseline  112029f  Time  6    Period  Weeks    Status  New            Plan - 05/04/18 1056    Clinical Impression Statement  Pt demo progress toward pain reduction and balance goalut continues to demo endurance, balance and strenght deficiencies. Pt demo improvement with confidence regarding falling, obstacle navigation and increasing speed. Pt will benefit from skilled therapy to return to prior level of function and reduce fall risk.     Rehab Potential  Good    Clinical Impairments Affecting Rehab Potential  Positive: motivation; Negative: , age    PT Frequency  2x / week    PT Duration  6 weeks    PT Treatment/Interventions  ADLs/Self Care Home Management;Aquatic Therapy;Biofeedback;DME Instruction;Gait training;Stair training;Functional mobility training;Therapeutic activities;Therapeutic exercise;Balance training;Neuromuscular re-education;Patient/family education;Passive range of motion;Energy conservation;Ultrasound;Moist Heat;Iontophoresis 4mg82m Dexamethasone;Cryotherapy;Electrical Stimulation;Manual techniques;Dry needling    PT Next Visit Plan  progress core and back strengthening    PT Home Exercise Plan  see education section    Consulted and Agree with Plan of Care  Patient       Patient will benefit from skilled therapeutic intervention in order to improve the following deficits and impairments:  Pain, Increased fascial restricitons, Decreased coordination, Decreased mobility, Decreased activity tolerance, Decreased endurance, Decreased range of motion, Decreased strength, Hypomobility, Postural dysfunction, Abnormal gait, Decreased balance, Difficulty walking  Visit  Diagnosis: Muscle weakness (generalized)  History of falling     Problem List Patient Active Problem List   Diagnosis Date Noted  . Chronic bilateral low back pain without sciatica 12/31/2017  . Hypomagnesemia 09/29/2017  . Hypokalemia 09/29/2017  . Atrial fibrillation (HCC)[place]/22/2019  . Trapezius strain, right, initial encounter 09/10/2017  . Depression, major, single episode, mild (HCC)[place]/22/2019  . Chronic venous insufficiency 09/09/2017  . Lymphedema 09/09/2017  . Pain of toe of left foot 08/17/2017  . Grief 08/17/2017  . Epistaxis 07/31/2017  . Leg swelling 07/31/2017  . Dry eyes 07/06/2017  . Pain and swelling of lower extremity, right 03/29/2017  . Right shoulder pain 03/29/2017  . Diarrhea 05/18/2016  . Fall 05/01/2016  . Unintentional weight loss 12/30/2015  . Leg weakness, bilateral 12/30/2015  . Hyperkalemia 01/01/2015  . Occasional numbness/prickling/tingling of fingers and toes 12/18/2014  . Routine general medical examination at a health care facility 12/13/2013  . Macrocytic anemia 05/19/2013  . 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 Psoriasis   . Osteoarthritis, multiple sites   . Episodic mood disorder (HCC)[place]/14/2013  . History of breast cancer 10/28/2011  . GERD (gastroesophageal reflux disease)   . Hyperlipidemia   . Hypertension   . Osteopenia   . Carotid stenosis 05/27/2011   NataKarilyn CotaT    WeslBlythe Stanford DPT 05/04/2018, 11:00 AM  [facility] [address] [place]ne: [phone]:  [phone]: ChriHEYLI MIN: [id number] of Birth: 8/29Apr 27, 1942

## 2018-05-09 ENCOUNTER — Ambulatory Visit: Payer: Medicare Other

## 2018-05-09 DIAGNOSIS — M6281 Muscle weakness (generalized): Secondary | ICD-10-CM | POA: Diagnosis not present

## 2018-05-09 DIAGNOSIS — M545 Low back pain, unspecified: Secondary | ICD-10-CM

## 2018-05-09 DIAGNOSIS — Z9181 History of falling: Secondary | ICD-10-CM

## 2018-05-09 NOTE — Therapy (Signed)
[facility] [address], [place], [address] Phone: [phone]   Fax:  [phone]  Physical Therapy Treatment  Patient Details  Name: [name] MRN: [id number] Date of Birth: 1941/04/07 Referring Provider (PT): Caryl Bis MD   Encounter Date: 05/09/2018  PT End of Session - 05/09/18 1316    Visit Number  33    Number of Visits  38    Date for PT Re-Evaluation  05/16/18    Authorization Type  7/10    PT Start Time  1310    PT Stop Time  1345    PT Time Calculation (min)  35 min    Activity Tolerance  Patient tolerated treatment well    Behavior During Therapy  [facility] for tasks assessed/performed       Past Medical History:  Diagnosis Date  . (HFpEF) heart failure with preserved ejection fraction ([place])    a. 08/2017 Echo: EF 55-60%, no rwma, mild MR, mildly dil LA, nl RV fxn.  . Breast cancer ([place]) 2001   left breast  . Cancer ([place]) 2001   left breast ca  . Carotid arterial disease ([place])    a. 10/2004 s/p L CEA; b. 12/2015 Carotid U/S: RICA 1-39%; b. LICA patent CEA site.  Marland Kitchen GERD (gastroesophageal reflux disease)   . Hyperlipidemia   . Hypertension   . Osteoarthritis, multiple sites   . Osteopenia   . Persistent atrial fibrillation    a. Dx 08/2017; b. CHA2DS2VASc = 6-->Xarelto; c. 09/2017 Successful DCCV (second shock - 200J);   Marland Kitchen Personal history of radiation therapy 2001   left breast ca  . Psoriasis     Past Surgical History:  Procedure Laterality Date  . ABDOMINAL HYSTERECTOMY    . ANKLE FRACTURE SURGERY  4/08   left---hardware still in place  . 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 BREAST EXCISIONAL BIOPSY Left yrs ago   benign  . BREAST LUMPECTOMY Left 2001   f/u radiation  . CARDIOVERSION N/A 10/04/2017   Procedure: CARDIOVERSION;  Surgeon: Wellington Hampshire, MD;  Location: [facility] ORS;  Service: Cardiovascular;  Laterality: N/A;  . CARDIOVERSION N/A 12/16/2017   Procedure:  CARDIOVERSION;  Surgeon: [name], MD;  Location: [facility] ORS;  Service: Cardiovascular;  Laterality: N/A;  . CAROTID ENDARTERECTOMY Left 10/23/2004  . FRACTURE SURGERY    . OOPHORECTOMY    . SHOULDER SURGERY  6/07   left  . TONSILLECTOMY AND ADENOIDECTOMY    . TOTAL HIP ARTHROPLASTY  2004   right    There were no vitals filed for this visit.  Subjective Assessment - 05/09/18 1314    Subjective  Patient reports she had a bad night of sleeping last night with increased pain which is a first time she had experienced such pain.     Pertinent History  Patient previously seen for balance difficulties and shoulder pain and the right.  Patient reports no falls in the past six months. Patient reports pain has not improved for the past month and has been about the same since the onset of pain.     Limitations  Walking    Patient Stated Goals  To improve balance when walking     Currently in Pain?  No/denies    Pain Onset  More than a month ago       TREATMENT Therapeutic Exercise: LTRS in hooklying - x 2 minutes Knees to chest with physioball -  x 2 minutes Hooklying bridges - x30 Overhead reaches with arms straight on physioball - x 20  Cone pick ups from airex - 2 x 16 B Obstacle navigation over cones, compliant surfaces and DME with PT providing CGA  Airex beam with PT providing CGA x 4 down and back  Sit to stands with TRX - 2 x 12      Patient demonstrates increased fatigue at the end of the session     PT Education - 05/09/18 1315    Education provided  Yes    Education Details  form/technique with exercise    Person(s) Educated  Patient    Methods  Explanation;Demonstration    Comprehension  Verbalized understanding;Returned demonstration          PT Long Term Goals - 04/18/18 1617      PT LONG TERM GOAL #1   Title  Patient will be independent with HEP to continue benefits of therapy after discharge.     Baseline  Dependent with form and technique for balancing  exercises; moderate cueing for balance and strengthening; 02/14/2018: Independent with HEP     Time  4    Period  Weeks    Status  Achieved      PT LONG TERM GOAL #2   Title  Patient will improve FGA to over 26/30 to indicate functional improvement in strength and balance limitations and decrease fall risk    Baseline  22/30; ; 09/01/2017: 24/30; 09/30/2017: 25/30; 11/03/2017: 25/30; 02/14/2018: deferred to next visit; 03/16/2018: 28/30    Time  8    Period  Weeks    Status  Achieved      PT LONG TERM GOAL #3   Title  Patient will improve 5xSTS to under 16 seconds without use of a pillow to indicate functional improvement of LE function and decreased fall risk    Baseline  19 sec with pillow under chair; 09/01/2017 12 seconds with use of a pillow; 09/30/2017: 10.9 sec with use of pillow; defered today secondary to increased soreness from walking the previous day    Time  8    Period  Weeks    Status  Partially Met      PT LONG TERM GOAL #4   Title  Patient will be able to balance for 10sec with SLS to improve static balance and ability to get dressed when in standing    Baseline  1 sec; 09/01/2017: 3 sec each leg; 09/30/17: 4.5; 11/03/2017: 5 sec; 02/14/2018: Deffered to next visit; 03/16/2018: 7sec; 04/18/2018:7 sec    Time  8    Period  Weeks    Status  On-going      PT LONG TERM GOAL #5   Title  Patient will have a worst lumbar pain of a 3/10 over the past week to indicate functional improvement with performing standing activities and prolonged sitting.    Baseline  Worst pain: 8/10; 02/14/2018: worst pain: 7/10; 03/16/2018: 3/10    Time  6    Period  Weeks    Status  Achieved      Additional Long Term Goals   Additional Long Term Goals  Yes      PT LONG TERM GOAL #6   Title  Patient will be able to sit for >1 hour to improve abiltity to drive without increase in pain.    Baseline  Increased pain after driving for <60YTK; 1/60/1093: Patient reports increase in pain for 91mn; 03/16/2018: 1  hour  Time  6    Period  Weeks    Status  Achieved      PT LONG TERM GOAL #7   Title  Patient will improve 30mnWT to over 14086fto improve ability to perform endurance based acitivities such as walking to the mailbox without increase in fatigue    Baseline  [id number]  Time  6    Period  Weeks    Status  New            Plan - 05/09/18 1321    Clinical Impression Statement  Continued to focus on improving endurance as well as pain and spasms with her lumbar spine. Patient continues to demonstrate increased weakness along her hips and decreased balance most notably with compliant surfaces and single leg stance. Patient will benefit from further skilled therapy to return to prior level of function.     Rehab Potential  Good    Clinical Impairments Affecting Rehab Potential  Positive: motivation; Negative: , age    PT Frequency  2x / week    PT Duration  6 weeks    PT Treatment/Interventions  ADLs/Self Care Home Management;Aquatic Therapy;Biofeedback;DME Instruction;Gait training;Stair training;Functional mobility training;Therapeutic activities;Therapeutic exercise;Balance training;Neuromuscular re-education;Patient/family education;Passive range of motion;Energy conservation;Ultrasound;Moist Heat;Iontophoresis 4mg66m Dexamethasone;Cryotherapy;Electrical Stimulation;Manual techniques;Dry needling    PT Next Visit Plan  progress core and back strengthening    PT Home Exercise Plan  see education section    Consulted and Agree with Plan of Care  Patient       Patient will benefit from skilled therapeutic intervention in order to improve the following deficits and impairments:  Pain, Increased fascial restricitons, Decreased coordination, Decreased mobility, Decreased activity tolerance, Decreased endurance, Decreased range of motion, Decreased strength, Hypomobility, Postural dysfunction, Abnormal gait, Decreased balance, Difficulty walking  Visit Diagnosis: Muscle weakness  (generalized)  History of falling  Acute bilateral low back pain without sciatica     Problem List Patient Active Problem List   Diagnosis Date Noted  . Chronic bilateral low back pain without sciatica 12/31/2017  . Hypomagnesemia 09/29/2017  . Hypokalemia 09/29/2017  . Atrial fibrillation (HCC)[place]/22/2019  . Trapezius strain, right, initial encounter 09/10/2017  . Depression, major, single episode, mild (HCC)[place]/22/2019  . Chronic venous insufficiency 09/09/2017  . Lymphedema 09/09/2017  . Pain of toe of left foot 08/17/2017  . Grief 08/17/2017  . Epistaxis 07/31/2017  . Leg swelling 07/31/2017  . Dry eyes 07/06/2017  . Pain and swelling of lower extremity, right 03/29/2017  . Right shoulder pain 03/29/2017  . Diarrhea 05/18/2016  . Fall 05/01/2016  . Unintentional weight loss 12/30/2015  . Leg weakness, bilateral 12/30/2015  . Hyperkalemia 01/01/2015  . Occasional numbness/prickling/tingling of fingers and toes 12/18/2014  . Routine general medical examination at a health care facility 12/13/2013  . Macrocytic anemia 05/19/2013  . Psoriatic arthritis (HCC)[place]/18/2014  . Psoriasis   . Osteoarthritis, multiple sites   . Episodic mood disorder (HCC)[place]/14/2013  . History of breast cancer 10/28/2011  . GERD (gastroesophageal reflux disease)   . Hyperlipidemia   . Hypertension   . Osteopenia   . Carotid stenosis 05/27/2011    WeslBlythe Stanford DPT 05/09/2018, 1:46 PM  [facility] [address], [place]: [phone]:  [phone]: [name]: [phone] of Birth: 02/2918-Jan-1942

## 2018-05-11 ENCOUNTER — Ambulatory Visit: Payer: Medicare Other

## 2018-05-11 DIAGNOSIS — M545 Low back pain, unspecified: Secondary | ICD-10-CM

## 2018-05-11 DIAGNOSIS — M6281 Muscle weakness (generalized): Secondary | ICD-10-CM

## 2018-05-11 DIAGNOSIS — Z9181 History of falling: Secondary | ICD-10-CM | POA: Diagnosis not present

## 2018-05-11 NOTE — Therapy (Signed)
[facility] [address], [place], [address] Phone: [phone]   Fax:  [phone]  Physical Therapy Treatment  Patient Details  Name: [name] MRN: [id number] Date of Birth: May 13, 1941 Referring Provider (PT): Caryl Bis MD   Encounter Date: 05/11/2018  PT End of Session - 05/11/18 1042    Visit Number  34    Number of Visits  38    Date for PT Re-Evaluation  05/16/18    Authorization Type  8/10    PT Start Time  1004    PT Stop Time  1045    PT Time Calculation (min)  41 min    Activity Tolerance  Patient tolerated treatment well    Behavior During Therapy  [facility] for tasks assessed/performed       Past Medical History:  Diagnosis Date  . (HFpEF) heart failure with preserved ejection fraction ([place])    a. 08/2017 Echo: EF 55-60%, no rwma, mild MR, mildly dil LA, nl RV fxn.  . Breast cancer ([place]) 2001   left breast  . Cancer ([place]) 2001   left breast ca  . Carotid arterial disease ([place])    a. 10/2004 s/p L CEA; b. 12/2015 Carotid U/S: RICA 1-39%; b. LICA patent CEA site.  Marland Kitchen GERD (gastroesophageal reflux disease)   . Hyperlipidemia   . Hypertension   . Osteoarthritis, multiple sites   . Osteopenia   . Persistent atrial fibrillation    a. Dx 08/2017; b. CHA2DS2VASc = 6-->Xarelto; c. 09/2017 Successful DCCV (second shock - 200J);   Marland Kitchen Personal history of radiation therapy 2001   left breast ca  . Psoriasis     Past Surgical History:  Procedure Laterality Date  . ABDOMINAL HYSTERECTOMY    . ANKLE FRACTURE SURGERY  4/08   left---hardware still in place  . 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 BREAST EXCISIONAL BIOPSY Left yrs ago   benign  . BREAST LUMPECTOMY Left 2001   f/u radiation  . CARDIOVERSION N/A 10/04/2017   Procedure: CARDIOVERSION;  Surgeon: Wellington Hampshire, MD;  Location: [facility] ORS;  Service: Cardiovascular;  Laterality: N/A;  . CARDIOVERSION N/A 12/16/2017   Procedure:  CARDIOVERSION;  Surgeon: [name], MD;  Location: [facility] ORS;  Service: Cardiovascular;  Laterality: N/A;  . CAROTID ENDARTERECTOMY Left 10/23/2004  . FRACTURE SURGERY    . OOPHORECTOMY    . SHOULDER SURGERY  6/07   left  . TONSILLECTOMY AND ADENOIDECTOMY    . TOTAL HIP ARTHROPLASTY  2004   right    There were no vitals filed for this visit.  Subjective Assessment - 05/11/18 1032    Subjective  Patient reports she would like to work on performing step ups to bettter get into her daughters house.     Pertinent History  Patient previously seen for balance difficulties and shoulder pain and the right.  Patient reports no falls in the past six months. Patient reports pain has not improved for the past month and has been about the same since the onset of pain.     Limitations  Walking    Patient Stated Goals  To improve balance when walking     Currently in Pain?  No/denies    Pain Onset  More than a month ago         TREATMENT Therapeutic Exercise: LTRS in hooklying - x 2 minutes Knees to chest with physioball - x 2 minutes Hooklying  bridges - x30 Cone pick ups from airex - 2 x 14 B Step ups onto 6" step - 2 x 10  Standing airex pad overhead reaching to cones - x7 B  Hip abduction at hip machine - 2 x 10 B 40# Lunges in standing - 2 x 10  Sit to stands with TRX - 3 x 12    Patient demonstrates increased fatigue at the end of the session    PT Education - 05/11/18 1041    Education provided  Yes    Education Details  form/technique with exercise    Person(s) Educated  Patient    Methods  Explanation;Demonstration    Comprehension  Verbalized understanding;Returned demonstration          PT Long Term Goals - 04/18/18 1617      PT LONG TERM GOAL #1   Title  Patient will be independent with HEP to continue benefits of therapy after discharge.     Baseline  Dependent with form and technique for balancing exercises; moderate cueing for balance and strengthening;  02/14/2018: Independent with HEP     Time  4    Period  Weeks    Status  Achieved      PT LONG TERM GOAL #2   Title  Patient will improve FGA to over 26/30 to indicate functional improvement in strength and balance limitations and decrease fall risk    Baseline  22/30; ; 09/01/2017: 24/30; 09/30/2017: 25/30; 11/03/2017: 25/30; 02/14/2018: deferred to next visit; 03/16/2018: 28/30    Time  8    Period  Weeks    Status  Achieved      PT LONG TERM GOAL #3   Title  Patient will improve 5xSTS to under 16 seconds without use of a pillow to indicate functional improvement of LE function and decreased fall risk    Baseline  19 sec with pillow under chair; 09/01/2017 12 seconds with use of a pillow; 09/30/2017: 10.9 sec with use of pillow; defered today secondary to increased soreness from walking the previous day    Time  8    Period  Weeks    Status  Partially Met      PT LONG TERM GOAL #4   Title  Patient will be able to balance for 10sec with SLS to improve static balance and ability to get dressed when in standing    Baseline  1 sec; 09/01/2017: 3 sec each leg; 09/30/17: 4.5; 11/03/2017: 5 sec; 02/14/2018: Deffered to next visit; 03/16/2018: 7sec; 04/18/2018:7 sec    Time  8    Period  Weeks    Status  On-going      PT LONG TERM GOAL #5   Title  Patient will have a worst lumbar pain of a 3/10 over the past week to indicate functional improvement with performing standing activities and prolonged sitting.    Baseline  Worst pain: 8/10; 02/14/2018: worst pain: 7/10; 03/16/2018: 3/10    Time  6    Period  Weeks    Status  Achieved      Additional Long Term Goals   Additional Long Term Goals  Yes      PT LONG TERM GOAL #6   Title  Patient will be able to sit for >1 hour to improve abiltity to drive without increase in pain.    Baseline  Increased pain after driving for <56LSL; 3/73/4287: Patient reports increase in pain for 20mn; 03/16/2018: 1 hour    Time  6    Period  Weeks    Status  Achieved       PT LONG TERM GOAL #7   Title  Patient will improve 89mnWT to over 14067fto improve ability to perform endurance based acitivities such as walking to the mailbox without increase in fatigue    Baseline  [id number]  Time  6    Period  Weeks    Status  New            Plan - 05/11/18 1042    Clinical Impression Statement  Focused on performing strengthening and balance exercises. Patient demonstrates decreased balance with leaning backwards and reaching on airex pad indicating poor static/dynamic balance. Patient demonstrates improvement with strengthening with ability to perform greater amount of exercises compared to previous sessions. Patient will benefit from further skilled therapy to return to prior level of function.     Rehab Potential  Good    Clinical Impairments Affecting Rehab Potential  Positive: motivation; Negative: , age    PT Frequency  2x / week    PT Duration  6 weeks    PT Treatment/Interventions  ADLs/Self Care Home Management;Aquatic Therapy;Biofeedback;DME Instruction;Gait training;Stair training;Functional mobility training;Therapeutic activities;Therapeutic exercise;Balance training;Neuromuscular re-education;Patient/family education;Passive range of motion;Energy conservation;Ultrasound;Moist Heat;Iontophoresis 4mg38m Dexamethasone;Cryotherapy;Electrical Stimulation;Manual techniques;Dry needling    PT Next Visit Plan  progress core and back strengthening    PT Home Exercise Plan  see education section    Consulted and Agree with Plan of Care  Patient       Patient will benefit from skilled therapeutic intervention in order to improve the following deficits and impairments:  Pain, Increased fascial restricitons, Decreased coordination, Decreased mobility, Decreased activity tolerance, Decreased endurance, Decreased range of motion, Decreased strength, Hypomobility, Postural dysfunction, Abnormal gait, Decreased balance, Difficulty walking  Visit Diagnosis: Muscle  weakness (generalized)  History of falling  Acute bilateral low back pain without sciatica     Problem List Patient Active Problem List   Diagnosis Date Noted  . Chronic bilateral low back pain without sciatica 12/31/2017  . Hypomagnesemia 09/29/2017  . Hypokalemia 09/29/2017  . Atrial fibrillation (HCC)[place]/22/2019  . Trapezius strain, right, initial encounter 09/10/2017  . Depression, major, single episode, mild (HCC)[place]/22/2019  . Chronic venous insufficiency 09/09/2017  . Lymphedema 09/09/2017  . Pain of toe of left foot 08/17/2017  . Grief 08/17/2017  . Epistaxis 07/31/2017  . Leg swelling 07/31/2017  . Dry eyes 07/06/2017  . Pain and swelling of lower extremity, right 03/29/2017  . Right shoulder pain 03/29/2017  . Diarrhea 05/18/2016  . Fall 05/01/2016  . Unintentional weight loss 12/30/2015  . Leg weakness, bilateral 12/30/2015  . Hyperkalemia 01/01/2015  . Occasional numbness/prickling/tingling of fingers and toes 12/18/2014  . Routine general medical examination at a health care facility 12/13/2013  . Macrocytic anemia 05/19/2013  . Psoriatic arthritis (HCC)[place]/18/2014  . Psoriasis   . Osteoarthritis, multiple sites   . Episodic mood disorder (HCC)[place]/14/2013  . History of breast cancer 10/28/2011  . GERD (gastroesophageal reflux disease)   . Hyperlipidemia   . Hypertension   . Osteopenia   . Carotid stenosis 05/27/2011    WeslBlythe Stanford DPT 05/11/2018, 11:39 AM  [facility] [address], [id number]: [phone]:  [phone]: ChriYAMILEE Harris: [phone] of Birth: 8/29Sep 11, 1942

## 2018-05-12 ENCOUNTER — Other Ambulatory Visit: Payer: Self-pay | Admitting: Family Medicine

## 2018-05-12 DIAGNOSIS — Z23 Encounter for immunization: Secondary | ICD-10-CM | POA: Diagnosis not present

## 2018-05-12 NOTE — Telephone Encounter (Signed)
Requested medication (s) are due for refill today: yes  Requested medication (s) are on the active medication list: yes  Last refill:  11/15/17 #60  Future visit scheduled: yes  07/08/18      Requested Prescriptions  Pending Prescriptions Disp Refills   LORazepam (ATIVAN) 0.5 MG tablet 60 tablet 0    Sig: TAKE 1 TABLET BY MOUTH 2 TIMES DAILY AS NEEDED FOR ANXIETY     Not Delegated - Psychiatry:  Anxiolytics/Hypnotics Failed - 05/12/2018  5:40 PM      Failed - This refill cannot be delegated      Failed - Urine Drug Screen completed in last 360 days.      Passed - Valid encounter within last 6 months    Recent Outpatient Visits          4 months ago Chronic bilateral low back pain without sciatica   [name], Angela Adam, MD   7 months ago Atrial fibrillation, unspecified type [name])   Dana [name], MD   8 months ago Irregular heartbeat   [name] [name], MD   8 months ago Pain of toe of left foot   [name] [name], MD   9 months ago Leg swelling   [name] [name], Angela Adam, MD      Future Appointments            In 1 month [name], Angela Adam, MD [facility]

## 2018-05-12 NOTE — Telephone Encounter (Signed)
Copied from [place] [phone]. Topic: Quick Communication - Rx Refill/Question >> May 12, 2018  5:33 PM Selinda Flavin B, NT wrote: Medication: LORazepam (ATIVAN) 0.5 MG tablet   Has the patient contacted their pharmacy? Yes.   (Agent: If no, request that the patient contact the pharmacy for the refill.) (Agent: If yes, when and what did the pharmacy advise?)  Preferred Pharmacy (with phone number or street name): [id number] [address], [place]: Please be advised that RX refills may take up to 3 business days. We ask that you follow-up with your pharmacy.

## 2018-05-14 LAB — EXERCISE TOLERANCE TEST
Estimated workload: 2.6 METS
Exercise duration (min): 4 min
Exercise duration (sec): 50 s
MPHR: 141 {beats}/min
Peak HR: 78 {beats}/min
Percent HR: 55 %
Rest HR: 54 {beats}/min

## 2018-05-16 ENCOUNTER — Ambulatory Visit: Payer: Medicare Other

## 2018-05-16 DIAGNOSIS — Z9181 History of falling: Secondary | ICD-10-CM | POA: Diagnosis not present

## 2018-05-16 DIAGNOSIS — M6281 Muscle weakness (generalized): Secondary | ICD-10-CM | POA: Diagnosis not present

## 2018-05-16 DIAGNOSIS — M545 Low back pain, unspecified: Secondary | ICD-10-CM

## 2018-05-16 NOTE — Therapy (Signed)
[facility] [address], [place], [address] Phone: [phone]   Fax:  [phone]  Physical Therapy Treatment  Patient Details  Name: [name] MRN: [id number] Date of Birth: 05/08/1941 Referring Provider (PT): Caryl Bis MD   Encounter Date: 05/16/2018  PT End of Session - 05/16/18 1347    Visit Number  35    Number of Visits  38    Date for PT Re-Evaluation  05/16/18    Authorization Type  9/10    PT Start Time  1302    PT Stop Time  1345    PT Time Calculation (min)  43 min    Activity Tolerance  Patient tolerated treatment well    Behavior During Therapy  [facility] for tasks assessed/performed       Past Medical History:  Diagnosis Date  . (HFpEF) heart failure with preserved ejection fraction ([place])    a. 08/2017 Echo: EF 55-60%, no rwma, mild MR, mildly dil LA, nl RV fxn.  . Breast cancer ([place]) 2001   left breast  . Cancer ([place]) 2001   left breast ca  . Carotid arterial disease ([place])    a. 10/2004 s/p L CEA; b. 12/2015 Carotid U/S: RICA 1-39%; b. LICA patent CEA site.  Marland Kitchen GERD (gastroesophageal reflux disease)   . Hyperlipidemia   . Hypertension   . Osteoarthritis, multiple sites   . Osteopenia   . Persistent atrial fibrillation    a. Dx 08/2017; b. CHA2DS2VASc = 6-->Xarelto; c. 09/2017 Successful DCCV (second shock - 200J);   Marland Kitchen Personal history of radiation therapy 2001   left breast ca  . Psoriasis     Past Surgical History:  Procedure Laterality Date  . ABDOMINAL HYSTERECTOMY    . ANKLE FRACTURE SURGERY  4/08   left---hardware still in place  . 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 BREAST EXCISIONAL BIOPSY Left yrs ago   benign  . BREAST LUMPECTOMY Left 2001   f/u radiation  . CARDIOVERSION N/A 10/04/2017   Procedure: CARDIOVERSION;  Surgeon: Wellington Hampshire, MD;  Location: [facility] ORS;  Service: Cardiovascular;  Laterality: N/A;  . CARDIOVERSION N/A 12/16/2017   Procedure:  CARDIOVERSION;  Surgeon: [name], MD;  Location: [facility] ORS;  Service: Cardiovascular;  Laterality: N/A;  . CAROTID ENDARTERECTOMY Left 10/23/2004  . FRACTURE SURGERY    . OOPHORECTOMY    . SHOULDER SURGERY  6/07   left  . TONSILLECTOMY AND ADENOIDECTOMY    . TOTAL HIP ARTHROPLASTY  2004   right    There were no vitals filed for this visit.  Subjective Assessment - 05/16/18 1337    Subjective  Patient reports no major changes since the previous session. Patient states she is overall improving.     Pertinent History  Patient previously seen for balance difficulties and shoulder pain and the right.  Patient reports no falls in the past six months. Patient reports pain has not improved for the past month and has been about the same since the onset of pain.     Limitations  Walking    Patient Stated Goals  To improve balance when walking     Currently in Pain?  No/denies    Pain Onset  More than a month ago       TREATMENT Therapeutic Exercise: LTRS in hooklying - x 2 minutes Knees to chest with physioball - x 2 minutes Hooklying bridges - x30 Cone pick  ups from airex - 2 x 14 B Side stepping up and over airex pad on mat - x 20 B Tandem ambulation on airex beam Standing airex pad overhead reaching to cones - x7 B  Hip abduction at hip machine - 2 x 10 B 40# Step ups onto 6" step - 2 x 10 with UE support Sit to stands with TRX - 3 x 12    Patient demonstrates increased fatigue at the end of the session   PT Education - 05/16/18 1347    Education provided  Yes    Education Details  form/technique with exercise    Person(s) Educated  Patient    Methods  Explanation;Demonstration    Comprehension  Verbalized understanding;Returned demonstration          PT Long Term Goals - 04/18/18 1617      PT LONG TERM GOAL #1   Title  Patient will be independent with HEP to continue benefits of therapy after discharge.     Baseline  Dependent with form and technique for  balancing exercises; moderate cueing for balance and strengthening; 02/14/2018: Independent with HEP     Time  4    Period  Weeks    Status  Achieved      PT LONG TERM GOAL #2   Title  Patient will improve FGA to over 26/30 to indicate functional improvement in strength and balance limitations and decrease fall risk    Baseline  22/30; ; 09/01/2017: 24/30; 09/30/2017: 25/30; 11/03/2017: 25/30; 02/14/2018: deferred to next visit; 03/16/2018: 28/30    Time  8    Period  Weeks    Status  Achieved      PT LONG TERM GOAL #3   Title  Patient will improve 5xSTS to under 16 seconds without use of a pillow to indicate functional improvement of LE function and decreased fall risk    Baseline  19 sec with pillow under chair; 09/01/2017 12 seconds with use of a pillow; 09/30/2017: 10.9 sec with use of pillow; defered today secondary to increased soreness from walking the previous day    Time  8    Period  Weeks    Status  Partially Met      PT LONG TERM GOAL #4   Title  Patient will be able to balance for 10sec with SLS to improve static balance and ability to get dressed when in standing    Baseline  1 sec; 09/01/2017: 3 sec each leg; 09/30/17: 4.5; 11/03/2017: 5 sec; 02/14/2018: Deffered to next visit; 03/16/2018: 7sec; 04/18/2018:7 sec    Time  8    Period  Weeks    Status  On-going      PT LONG TERM GOAL #5   Title  Patient will have a worst lumbar pain of a 3/10 over the past week to indicate functional improvement with performing standing activities and prolonged sitting.    Baseline  Worst pain: 8/10; 02/14/2018: worst pain: 7/10; 03/16/2018: 3/10    Time  6    Period  Weeks    Status  Achieved      Additional Long Term Goals   Additional Long Term Goals  Yes      PT LONG TERM GOAL #6   Title  Patient will be able to sit for >1 hour to improve abiltity to drive without increase in pain.    Baseline  Increased pain after driving for <56PVX; 4/80/1655: Patient reports increase in pain for 55mn;  03/16/2018:  1 hour    Time  6    Period  Weeks    Status  Achieved      PT LONG TERM GOAL #7   Title  Patient will improve 39mnWT to over 14017fto improve ability to perform endurance based acitivities such as walking to the mailbox without increase in fatigue    Baseline  112018f  Time  6    Period  Weeks    Status  New            Plan - 05/16/18 1351    Clinical Impression Statement  Focused on improving strengthening and balance particularly with reaching backwards and turning. Patient demonstrates improvement with stepping activities with ability to perform greater amonut of reps compared to her previous session. Patient will benefit from further skilled therapy to return to prior level of function.     Rehab Potential  Good    Clinical Impairments Affecting Rehab Potential  Positive: motivation; Negative: , age    PT Frequency  2x / week    PT Duration  6 weeks    PT Treatment/Interventions  ADLs/Self Care Home Management;Aquatic Therapy;Biofeedback;DME Instruction;Gait training;Stair training;Functional mobility training;Therapeutic activities;Therapeutic exercise;Balance training;Neuromuscular re-education;Patient/family education;Passive range of motion;Energy conservation;Ultrasound;Moist Heat;Iontophoresis 4mg74m Dexamethasone;Cryotherapy;Electrical Stimulation;Manual techniques;Dry needling    PT Next Visit Plan  progress core and back strengthening    PT Home Exercise Plan  see education section    Consulted and Agree with Plan of Care  Patient       Patient will benefit from skilled therapeutic intervention in order to improve the following deficits and impairments:  Pain, Increased fascial restricitons, Decreased coordination, Decreased mobility, Decreased activity tolerance, Decreased endurance, Decreased range of motion, Decreased strength, Hypomobility, Postural dysfunction, Abnormal gait, Decreased balance, Difficulty walking  Visit Diagnosis: Muscle weakness  (generalized)  History of falling  Acute bilateral low back pain without sciatica     Problem List Patient Active Problem List   Diagnosis Date Noted  . Chronic bilateral low back pain without sciatica 12/31/2017  . Hypomagnesemia 09/29/2017  . Hypokalemia 09/29/2017  . Atrial fibrillation (HCC)[place]/22/2019  . Trapezius strain, right, initial encounter 09/10/2017  . Depression, major, single episode, mild (HCC)[place]/22/2019  . Chronic venous insufficiency 09/09/2017  . Lymphedema 09/09/2017  . Pain of toe of left foot 08/17/2017  . Grief 08/17/2017  . Epistaxis 07/31/2017  . Leg swelling 07/31/2017  . Dry eyes 07/06/2017  . Pain and swelling of lower extremity, right 03/29/2017  . Right shoulder pain 03/29/2017  . Diarrhea 05/18/2016  . Fall 05/01/2016  . Unintentional weight loss 12/30/2015  . Leg weakness, bilateral 12/30/2015  . Hyperkalemia 01/01/2015  . Occasional numbness/prickling/tingling of fingers and toes 12/18/2014  . Routine general medical examination at a health care facility 12/13/2013  . Macrocytic anemia 05/19/2013  . Psoriatic arthritis (HCC)[place]/18/2014  . Psoriasis   . Osteoarthritis, multiple sites   . Episodic mood disorder (HCC)[place]/14/2013  . History of breast cancer 10/28/2011  . GERD (gastroesophageal reflux disease)   . Hyperlipidemia   . Hypertension   . Osteopenia   . Carotid stenosis 05/27/2011    WeslBlythe Stanford DPT 05/16/2018, 2:04 PM  [facility] [address] [id number]: [phone]:  [phone]: ChriADALIN VANDERPLOEG: [phone] of Birth: 8/29Aug 19, 1942

## 2018-05-17 MED ORDER — LORAZEPAM 0.5 MG PO TABS: ORAL_TABLET | ORAL | 0 refills | Status: DC

## 2018-05-17 NOTE — Telephone Encounter (Signed)
Sent to PCP for approval.  

## 2018-05-17 NOTE — Telephone Encounter (Signed)
Sent to pharmacy.  Controlled substance database reviewed. 

## 2018-05-18 ENCOUNTER — Ambulatory Visit: Payer: Medicare Other

## 2018-05-18 DIAGNOSIS — M6281 Muscle weakness (generalized): Secondary | ICD-10-CM

## 2018-05-18 DIAGNOSIS — M545 Low back pain, unspecified: Secondary | ICD-10-CM

## 2018-05-18 DIAGNOSIS — Z9181 History of falling: Secondary | ICD-10-CM

## 2018-05-18 NOTE — Therapy (Addendum)
[facility] [address] [place], [address] Phone: [phone]   Fax:  [phone]  Physical Therapy Treatment/Progress Note  Patient Details  Name: [name] MRN: [id number] Date of Birth: 1941/07/02 Referring Provider (PT): Caryl Bis MD  Dates of reporting period 04/20/18 to 05/18/18  Encounter Date: 05/18/2018  PT End of Session - 05/18/18 1021    Visit Number  36    Number of Visits  38    Date for PT Re-Evaluation  05/16/18    Authorization Type  10/10    PT Start Time  1003    PT Stop Time  1045    PT Time Calculation (min)  42 min    Activity Tolerance  Patient tolerated treatment well    Behavior During Therapy  [facility] for tasks assessed/performed       Past Medical History:  Diagnosis Date  . (HFpEF) heart failure with preserved ejection fraction ([place])    a. 08/2017 Echo: EF 55-60%, no rwma, mild MR, mildly dil LA, nl RV fxn.  . Breast cancer ([place]) 2001   left breast  . Cancer ([place]) 2001   left breast ca  . Carotid arterial disease ([place])    a. 10/2004 s/p L CEA; b. 12/2015 Carotid U/S: RICA 1-39%; b. LICA patent CEA site.  Marland Kitchen GERD (gastroesophageal reflux disease)   . Hyperlipidemia   . Hypertension   . Osteoarthritis, multiple sites   . Osteopenia   . Persistent atrial fibrillation    a. Dx 08/2017; b. CHA2DS2VASc = 6-->Xarelto; c. 09/2017 Successful DCCV (second shock - 200J);   Marland Kitchen Personal history of radiation therapy 2001   left breast ca  . Psoriasis     Past Surgical History:  Procedure Laterality Date  . ABDOMINAL HYSTERECTOMY    . ANKLE FRACTURE SURGERY  4/08   left---hardware still in place  . 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 BREAST EXCISIONAL BIOPSY Left yrs ago   benign  . BREAST LUMPECTOMY Left 2001   f/u radiation  . CARDIOVERSION N/A 10/04/2017   Procedure: CARDIOVERSION;  Surgeon: Wellington Hampshire, MD;  Location: [facility] ORS;  Service: Cardiovascular;   Laterality: N/A;  . CARDIOVERSION N/A 12/16/2017   Procedure: CARDIOVERSION;  Surgeon: [name], MD;  Location: [facility] ORS;  Service: Cardiovascular;  Laterality: N/A;  . CAROTID ENDARTERECTOMY Left 10/23/2004  . FRACTURE SURGERY    . OOPHORECTOMY    . SHOULDER SURGERY  6/07   left  . TONSILLECTOMY AND ADENOIDECTOMY    . TOTAL HIP ARTHROPLASTY  2004   right    There were no vitals filed for this visit.  Subjective Assessment - 05/18/18 1009    Subjective  Patient reports no major changes since the previou visit. Patient reports her endurance is "lousy" but states it might be slightly improved compared to past progress note.     Pertinent History  Patient previously seen for balance difficulties and shoulder pain and the right.  Patient reports no falls in the past six months. Patient reports pain has not improved for the past month and has been about the same since the onset of pain.     Limitations  Walking    Patient Stated Goals  To improve balance when walking     Currently in Pain?  No/denies    Pain Onset  More than a month ago        TREATMENT Therapeutic Exercise: LTRS  in hooklying - x 2 minutes Knees to chest with physioball - x 2 minutes Hooklying bridges - x30 Tandem stance on airex pad - 3 x 30sec  Side stepping up and over 6" step - x 20  Single leg stance - 4 x 30 sec  Cone pick ups from airex - 2 x 14 B Step ups onto 6" step - 2 x 10 with UE support Sit to stands with TRX - 2 x 12    Patient demonstrates increased fatigue at the end of the session   PT Education - 05/18/18 1016    Education provided  Yes    Education Details  form/technique with exercise    Person(s) Educated  Patient    Methods  Explanation;Demonstration    Comprehension  Returned demonstration;Verbalized understanding          PT Long Term Goals - 05/18/18 1026      PT LONG TERM GOAL #1   Title  Patient will be independent with HEP to continue benefits of therapy after  discharge.     Baseline  Dependent with form and technique for balancing exercises; moderate cueing for balance and strengthening; 02/14/2018: Independent with HEP     Time  4    Period  Weeks    Status  Achieved      PT LONG TERM GOAL #2   Title  Patient will improve FGA to over 26/30 to indicate functional improvement in strength and balance limitations and decrease fall risk    Baseline  22/30; ; 09/01/2017: 24/30; 09/30/2017: 25/30; 11/03/2017: 25/30; 02/14/2018: deferred to next visit; 03/16/2018: 28/30    Time  8    Period  Weeks    Status  Achieved      PT LONG TERM GOAL #3   Title  Patient will improve 5xSTS to under 16 seconds without use of a pillow to indicate functional improvement of LE function and decreased fall risk    Baseline  19 sec with pillow under chair; 09/01/2017 12 seconds with use of a pillow; 09/30/2017: 10.9 sec with use of pillow; defered today secondary to increased soreness from walking the previous day    Time  8    Period  Weeks    Status  Partially Met      PT LONG TERM GOAL #4   Title  Patient will be able to balance for 10sec with SLS to improve static balance and ability to get dressed when in standing    Baseline  1 sec; 09/01/2017: 3 sec each leg; 09/30/17: 4.5; 11/03/2017: 5 sec; 02/14/2018: Deffered to next visit; 03/16/2018: 7sec; 04/18/2018:7 sec; 05/18/2018; 5sec    Time  8    Period  Weeks    Status  On-going      PT LONG TERM GOAL #5   Title  Patient will have a worst lumbar pain of a 3/10 over the past week to indicate functional improvement with performing standing activities and prolonged sitting.    Baseline  Worst pain: 8/10; 02/14/2018: worst pain: 7/10; 03/16/2018: 3/10    Time  6    Period  Weeks    Status  Achieved      PT LONG TERM GOAL #6   Title  Patient will be able to sit for >1 hour to improve abiltity to drive without increase in pain.    Baseline  Increased pain after driving for <21YYQ; 03/13/36: Patient reports increase in pain for  66mn; 03/16/2018: 1 hour  Time  6    Period  Weeks    Status  Achieved      PT LONG TERM GOAL #7   Title  Patient will improve 59mnWT to over 14071fto improve ability to perform endurance based acitivities such as walking to the mailbox without increase in fatigue    Baseline  11208f10/30/2019: 1380f74f Time  6    Period  Weeks    Status  On-going            Plan - 05/18/18 1022    Clinical Impression Statement  Patient is making progress towards long term goals with improvement in 6min10mk test indicating improvement in balance and endurance compared to the previous progress note indicating funcitonal carryover. Patient reports pain in her back is overall improved compared to the previous sessions and only aggrvates her with transferring from sitting to lying and vice versa. Overall patient is improving however conitnues to have limitations with dynamic balance and static balance within narrow bases of support such as picking up after her dog ang walking into a closet. Patient will benefit from futher skilled therapy focused on improving limitations to return to prior level of function.     Rehab Potential  Good    Clinical Impairments Affecting Rehab Potential  Positive: motivation; Negative: , age    PT Frequency  2x / week    PT Duration  6 weeks    PT Treatment/Interventions  ADLs/Self Care Home Management;Aquatic Therapy;Biofeedback;DME Instruction;Gait training;Stair training;Functional mobility training;Therapeutic activities;Therapeutic exercise;Balance training;Neuromuscular re-education;Patient/family education;Passive range of motion;Energy conservation;Ultrasound;Moist Heat;Iontophoresis 4mg/m63mexamethasone;Cryotherapy;Electrical Stimulation;Manual techniques;Dry needling    PT Next Visit Plan  progress core and back strengthening    PT Home Exercise Plan  see education section    Consulted and Agree with Plan of Care  Patient       Patient will benefit from  skilled therapeutic intervention in order to improve the following deficits and impairments:  Pain, Increased fascial restricitons, Decreased coordination, Decreased mobility, Decreased activity tolerance, Decreased endurance, Decreased range of motion, Decreased strength, Hypomobility, Postural dysfunction, Abnormal gait, Decreased balance, Difficulty walking  Visit Diagnosis: Muscle weakness (generalized)  History of falling  Acute bilateral low back pain without sciatica     Problem List Patient Active Problem List   Diagnosis Date Noted  . Chronic bilateral low back pain without sciatica 12/31/2017  . Hypomagnesemia 09/29/2017  . Hypokalemia 09/29/2017  . Atrial fibrillation (HCC) 0[place]2/2019  . Trapezius strain, right, initial encounter 09/10/2017  . Depression, major, single episode, mild (HCC) 0[place]2/2019  . Chronic venous insufficiency 09/09/2017  . Lymphedema 09/09/2017  . Pain of toe of left foot 08/17/2017  . Grief 08/17/2017  . Epistaxis 07/31/2017  . Leg swelling 07/31/2017  . Dry eyes 07/06/2017  . Pain and swelling of lower extremity, right 03/29/2017  . Right shoulder pain 03/29/2017  . Diarrhea 05/18/2016  . Fall 05/01/2016  . Unintentional weight loss 12/30/2015  . Leg weakness, bilateral 12/30/2015  . Hyperkalemia 01/01/2015  . Occasional numbness/prickling/tingling of fingers and toes 12/18/2014  . Routine general medical examination at a health care facility 12/13/2013  . Macrocytic anemia 05/19/2013  . Psoriatic arthritis (HCC) 0[place]8/2014  . Psoriasis   . Osteoarthritis, multiple sites   . Episodic mood disorder (HCC) 0[place]4/2013  . History of breast cancer 10/28/2011  . GERD (gastroesophageal reflux disease)   . Hyperlipidemia   . Hypertension   . Osteopenia   . Carotid stenosis 05/27/2011    WesleyLake Bells  , PT DPT 05/18/2018, 10:42 AM  [facility] [address]  [place], [address] Phone: [phone]   Fax:  [phone]  Name: SATARA VIRELLA MRN: [id number] Date of Birth: 08-07-1940

## 2018-05-18 NOTE — Addendum Note (Signed)
Addended by: Blain Pais on: 05/18/2018 10:45 AM   Modules accepted: Orders

## 2018-05-23 ENCOUNTER — Ambulatory Visit: Payer: Medicare Other | Attending: Family Medicine

## 2018-05-23 ENCOUNTER — Ambulatory Visit: Payer: Medicare Other

## 2018-05-23 DIAGNOSIS — Z9181 History of falling: Secondary | ICD-10-CM | POA: Diagnosis not present

## 2018-05-23 DIAGNOSIS — M545 Low back pain, unspecified: Secondary | ICD-10-CM

## 2018-05-23 DIAGNOSIS — M6281 Muscle weakness (generalized): Secondary | ICD-10-CM | POA: Diagnosis not present

## 2018-05-23 NOTE — Therapy (Signed)
[facility] [address] [place], [address] Phone: [phone]   Fax:  [phone]  Physical Therapy Treatment  Patient Details  Name: [name] MRN: [id number] Date of Birth: 1940-10-19 Referring Provider (PT): Caryl Bis MD   Encounter Date: 05/23/2018  PT End of Session - 05/23/18 1611    Visit Number  37    Number of Visits  47    Date for PT Re-Evaluation  06/30/18    Authorization Type  1/10    PT Start Time  1515    PT Stop Time  1600    PT Time Calculation (min)  45 min    Activity Tolerance  Patient tolerated treatment well    Behavior During Therapy  [facility] for tasks assessed/performed       Past Medical History:  Diagnosis Date  . (HFpEF) heart failure with preserved ejection fraction ([place])    a. 08/2017 Echo: EF 55-60%, no rwma, mild MR, mildly dil LA, nl RV fxn.  . Breast cancer ([place]) 2001   left breast  . Cancer ([place]) 2001   left breast ca  . Carotid arterial disease ([place])    a. 10/2004 s/p L CEA; b. 12/2015 Carotid U/S: RICA 1-39%; b. LICA patent CEA site.  Marland Kitchen GERD (gastroesophageal reflux disease)   . Hyperlipidemia   . Hypertension   . Osteoarthritis, multiple sites   . Osteopenia   . Persistent atrial fibrillation    a. Dx 08/2017; b. CHA2DS2VASc = 6-->Xarelto; c. 09/2017 Successful DCCV (second shock - 200J);   Marland Kitchen Personal history of radiation therapy 2001   left breast ca  . Psoriasis     Past Surgical History:  Procedure Laterality Date  . ABDOMINAL HYSTERECTOMY    . ANKLE FRACTURE SURGERY  4/08   left---hardware still in place  . 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 BREAST EXCISIONAL BIOPSY Left yrs ago   benign  . BREAST LUMPECTOMY Left 2001   f/u radiation  . CARDIOVERSION N/A 10/04/2017   Procedure: CARDIOVERSION;  Surgeon: Wellington Hampshire, MD;  Location: [facility] ORS;  Service: Cardiovascular;  Laterality: N/A;  . CARDIOVERSION N/A 12/16/2017   Procedure:  CARDIOVERSION;  Surgeon: [name], MD;  Location: [facility] ORS;  Service: Cardiovascular;  Laterality: N/A;  . CAROTID ENDARTERECTOMY Left 10/23/2004  . FRACTURE SURGERY    . OOPHORECTOMY    . SHOULDER SURGERY  6/07   left  . TONSILLECTOMY AND ADENOIDECTOMY    . TOTAL HIP ARTHROPLASTY  2004   right    There were no vitals filed for this visit.  Subjective Assessment - 05/23/18 1610    Subjective  Patient reports she had a busy weekend but does not fel tired. Patient states her balance has been improving.    Pertinent History  Patient previously seen for balance difficulties and shoulder pain and the right.  Patient reports no falls in the past six months. Patient reports pain has not improved for the past month and has been about the same since the onset of pain.     Limitations  Walking    Patient Stated Goals  To improve balance when walking     Currently in Pain?  No/denies    Pain Onset  More than a month ago         TREATMENT Therapeutic Exercise: LTRS in hooklying - x 2 minutes Knees to chest with physioball - x 2 minutes Hooklying  bridges - x30 Side stepping up and over balance stones - x 20  Stepping on balance stones - with reaching with LEs - x 25 Step ups onto 6" step - 2 x 10 with UE support Sit to stands with TRX - 2 x 12 Side stepping with GTB around knees - x 20    Patient demonstrates increased fatigue at the end of the session   PT Education - 05/23/18 1611    Education provided  Yes    Education Details  form/technique with exercise    Person(s) Educated  Patient    Methods  Explanation;Demonstration    Comprehension  Verbalized understanding;Returned demonstration          PT Long Term Goals - 05/18/18 1026      PT LONG TERM GOAL #1   Title  Patient will be independent with HEP to continue benefits of therapy after discharge.     Baseline  Dependent with form and technique for balancing exercises; moderate cueing for balance and strengthening;  02/14/2018: Independent with HEP     Time  4    Period  Weeks    Status  Achieved      PT LONG TERM GOAL #2   Title  Patient will improve FGA to over 26/30 to indicate functional improvement in strength and balance limitations and decrease fall risk    Baseline  22/30; ; 09/01/2017: 24/30; 09/30/2017: 25/30; 11/03/2017: 25/30; 02/14/2018: deferred to next visit; 03/16/2018: 28/30    Time  8    Period  Weeks    Status  Achieved      PT LONG TERM GOAL #3   Title  Patient will improve 5xSTS to under 16 seconds without use of a pillow to indicate functional improvement of LE function and decreased fall risk    Baseline  19 sec with pillow under chair; 09/01/2017 12 seconds with use of a pillow; 09/30/2017: 10.9 sec with use of pillow; defered today secondary to increased soreness from walking the previous day    Time  8    Period  Weeks    Status  Partially Met      PT LONG TERM GOAL #4   Title  Patient will be able to balance for 10sec with SLS to improve static balance and ability to get dressed when in standing    Baseline  1 sec; 09/01/2017: 3 sec each leg; 09/30/17: 4.5; 11/03/2017: 5 sec; 02/14/2018: Deffered to next visit; 03/16/2018: 7sec; 04/18/2018:7 sec; 05/18/2018; 5sec    Time  8    Period  Weeks    Status  On-going      PT LONG TERM GOAL #5   Title  Patient will have a worst lumbar pain of a 3/10 over the past week to indicate functional improvement with performing standing activities and prolonged sitting.    Baseline  Worst pain: 8/10; 02/14/2018: worst pain: 7/10; 03/16/2018: 3/10    Time  6    Period  Weeks    Status  Achieved      PT LONG TERM GOAL #6   Title  Patient will be able to sit for >1 hour to improve abiltity to drive without increase in pain.    Baseline  Increased pain after driving for <37CHY; 8/50/2774: Patient reports increase in pain for 65mn; 03/16/2018: 1 hour    Time  6    Period  Weeks    Status  Achieved      PT LONG TERM GOAL #  7   Title  Patient will  improve 33mnWT to over 14038fto improve ability to perform endurance based acitivities such as walking to the mailbox without increase in fatigue    Baseline  112040f10/30/2019: [id number] Time  6    Period  Weeks    Status  On-going            Plan - 05/23/18 1613    Clinical Impression Statement  Patient demonstrates improvement with balancing with leaning backwards only requiring therapist support once during the exercise. Although patient is improving, she continues to have increased dificulty with walking balance most notably with narrow BOS and patient will benefit from further skilled therapy to return to prior level of function.     Rehab Potential  Good    Clinical Impairments Affecting Rehab Potential  Positive: motivation; Negative: , age    PT Frequency  2x / week    PT Duration  6 weeks    PT Treatment/Interventions  ADLs/Self Care Home Management;Aquatic Therapy;Biofeedback;DME Instruction;Gait training;Stair training;Functional mobility training;Therapeutic activities;Therapeutic exercise;Balance training;Neuromuscular re-education;Patient/family education;Passive range of motion;Energy conservation;Ultrasound;Moist Heat;Iontophoresis 4mg/23mDexamethasone;Cryotherapy;Electrical Stimulation;Manual techniques;Dry needling    PT Next Visit Plan  progress core and back strengthening    PT Home Exercise Plan  see education section    Consulted and Agree with Plan of Care  Patient       Patient will benefit from skilled therapeutic intervention in order to improve the following deficits and impairments:  Pain, Increased fascial restricitons, Decreased coordination, Decreased mobility, Decreased activity tolerance, Decreased endurance, Decreased range of motion, Decreased strength, Hypomobility, Postural dysfunction, Abnormal gait, Decreased balance, Difficulty walking  Visit Diagnosis: Muscle weakness (generalized)  History of falling  Acute bilateral low back pain without  sciatica     Problem List Patient Active Problem List   Diagnosis Date Noted  . Chronic bilateral low back pain without sciatica 12/31/2017  . Hypomagnesemia 09/29/2017  . Hypokalemia 09/29/2017  . Atrial fibrillation (HCC) [place]22/2019  . Trapezius strain, right, initial encounter 09/10/2017  . Depression, major, single episode, mild (HCC) [place]22/2019  . Chronic venous insufficiency 09/09/2017  . Lymphedema 09/09/2017  . Pain of toe of left foot 08/17/2017  . Grief 08/17/2017  . Epistaxis 07/31/2017  . Leg swelling 07/31/2017  . Dry eyes 07/06/2017  . Pain and swelling of lower extremity, right 03/29/2017  . Right shoulder pain 03/29/2017  . Diarrhea 05/18/2016  . Fall 05/01/2016  . Unintentional weight loss 12/30/2015  . Leg weakness, bilateral 12/30/2015  . Hyperkalemia 01/01/2015  . Occasional numbness/prickling/tingling of fingers and toes 12/18/2014  . Routine general medical examination at a health care facility 12/13/2013  . Macrocytic anemia 05/19/2013  . Psoriatic arthritis (HCC) [place]18/2014  . Psoriasis   . Osteoarthritis, multiple sites   . Episodic mood disorder (HCC) [place]14/2013  . History of breast cancer 10/28/2011  . GERD (gastroesophageal reflux disease)   . Hyperlipidemia   . Hypertension   . Osteopenia   . Carotid stenosis 05/27/2011    WesleBlythe StanfordDPT 05/23/2018, 4:15 PM  [facility] [address] [id number]: [phone]:  [phone]: ChrisBAILEY FAIELLA [phone] of Birth: 03/17/04/31/1942

## 2018-05-25 ENCOUNTER — Ambulatory Visit: Payer: Medicare Other

## 2018-05-25 DIAGNOSIS — M6281 Muscle weakness (generalized): Secondary | ICD-10-CM | POA: Diagnosis not present

## 2018-05-25 DIAGNOSIS — M545 Low back pain: Secondary | ICD-10-CM | POA: Diagnosis not present

## 2018-05-25 DIAGNOSIS — Z9181 History of falling: Secondary | ICD-10-CM | POA: Diagnosis not present

## 2018-05-25 NOTE — Therapy (Signed)
[place] [facility] [address], [place], [address] Phone: [phone]   Fax:  [phone]  Physical Therapy Treatment  Patient Details  Name: [name] MRN: [id number] Date of Birth: [birth year] Referring Provider (PT): Caryl Bis MD   Encounter Date: 05/25/2018  PT End of Session - 05/25/18 1025    Visit Number  38    Number of Visits  47    Date for PT Re-Evaluation  06/30/18    Authorization Type  2/10    PT Start Time  0945    PT Stop Time  1030    PT Time Calculation (min)  45 min    Activity Tolerance  Patient tolerated treatment well    Behavior During Therapy  [facility] for tasks assessed/performed       Past Medical History:  Diagnosis Date  . (HFpEF) heart failure with preserved ejection fraction ([place])    a. 08/2017 Echo: EF 55-60%, no rwma, mild MR, mildly dil LA, nl RV fxn.  . Breast cancer ([place]) 2001   left breast  . Cancer ([place]) 2001   left breast ca  . Carotid arterial disease ([place])    a. 10/2004 s/p L CEA; b. 12/2015 Carotid U/S: RICA 1-39%; b. LICA patent CEA site.  Marland Kitchen GERD (gastroesophageal reflux disease)   . Hyperlipidemia   . Hypertension   . Osteoarthritis, multiple sites   . Osteopenia   . Persistent atrial fibrillation    a. Dx 08/2017; b. CHA2DS2VASc = 6-->Xarelto; c. 09/2017 Successful DCCV (second shock - 200J);   Marland Kitchen Personal history of radiation therapy 2001   left breast ca  . Psoriasis     Past Surgical History:  Procedure Laterality Date  . ABDOMINAL HYSTERECTOMY    . ANKLE FRACTURE SURGERY  4/08   left---hardware still in place  . 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 BREAST EXCISIONAL BIOPSY Left yrs ago   benign  . BREAST LUMPECTOMY Left 2001   f/u radiation  . CARDIOVERSION N/A 10/04/2017   Procedure: CARDIOVERSION;  Surgeon: Wellington Hampshire, MD;  Location: [facility] ORS;  Service: Cardiovascular;  Laterality: N/A;  . CARDIOVERSION N/A 12/16/2017   Procedure:  CARDIOVERSION;  Surgeon: [name], MD;  Location: [facility] ORS;  Service: Cardiovascular;  Laterality: N/A;  . CAROTID ENDARTERECTOMY Left 10/23/2004  . FRACTURE SURGERY    . OOPHORECTOMY    . SHOULDER SURGERY  6/07   left  . TONSILLECTOMY AND ADENOIDECTOMY    . TOTAL HIP ARTHROPLASTY  2004   right    There were no vitals filed for this visit.  Subjective Assessment - 05/25/18 1024    Subjective  Patient reports no major changes since the previous sesion. Patient reports she has been more active recently.    Pertinent History  Patient previously seen for balance difficulties and shoulder pain and the right.  Patient reports no falls in the past six months. Patient reports pain has not improved for the past month and has been about the same since the onset of pain.     Limitations  Walking    Patient Stated Goals  To improve balance when walking     Currently in Pain?  No/denies    Pain Onset  More than a month ago          TREATMENT Therapeutic Exercise: LTRS in hooklying - x 2 minutes Hooklying bridges - x30 Step ups onto 6" step - 2  x 10 with UE support Squats with cone pickups on airex pad - 4 x 8  Hip abduction in standing at hip machine - 2 x 10 B Reach behind cone grabs in standing on airex pad - 2 x 10  Sit to stands with TRX - 2 x 12 Stepping on balance stones - with reaching with LEs - x 25    Patient demonstrates increased fatigue at the end of the session    PT Education - 05/25/18 1025    Education provided  Yes    Education Details  form/technique with exercise    Person(s) Educated  Patient    Methods  Explanation;Demonstration    Comprehension  Verbalized understanding;Returned demonstration          PT Long Term Goals - 05/18/18 1026      PT LONG TERM GOAL #1   Title  Patient will be independent with HEP to continue benefits of therapy after discharge.     Baseline  Dependent with form and technique for balancing exercises; moderate cueing for  balance and strengthening; 02/14/2018: Independent with HEP     Time  4    Period  Weeks    Status  Achieved      PT LONG TERM GOAL #2   Title  Patient will improve FGA to over 26/30 to indicate functional improvement in strength and balance limitations and decrease fall risk    Baseline  22/30; ; 09/01/2017: 24/30; 09/30/2017: 25/30; 11/03/2017: 25/30; 02/14/2018: deferred to next visit; 03/16/2018: 28/30    Time  8    Period  Weeks    Status  Achieved      PT LONG TERM GOAL #3   Title  Patient will improve 5xSTS to under 16 seconds without use of a pillow to indicate functional improvement of LE function and decreased fall risk    Baseline  19 sec with pillow under chair; 09/01/2017 12 seconds with use of a pillow; 09/30/2017: 10.9 sec with use of pillow; defered today secondary to increased soreness from walking the previous day    Time  8    Period  Weeks    Status  Partially Met      PT LONG TERM GOAL #4   Title  Patient will be able to balance for 10sec with SLS to improve static balance and ability to get dressed when in standing    Baseline  1 sec; 09/01/2017: 3 sec each leg; 09/30/17: 4.5; 11/03/2017: 5 sec; 02/14/2018: Deffered to next visit; 03/16/2018: 7sec; 04/18/2018:7 sec; 05/18/2018; 5sec    Time  8    Period  Weeks    Status  On-going      PT LONG TERM GOAL #5   Title  Patient will have a worst lumbar pain of a 3/10 over the past week to indicate functional improvement with performing standing activities and prolonged sitting.    Baseline  Worst pain: 8/10; 02/14/2018: worst pain: 7/10; 03/16/2018: 3/10    Time  6    Period  Weeks    Status  Achieved      PT LONG TERM GOAL #6   Title  Patient will be able to sit for >1 hour to improve abiltity to drive without increase in pain.    Baseline  Increased pain after driving for <62GBT; 12/04/6158: Patient reports increase in pain for 70mn; 03/16/2018: 1 hour    Time  6    Period  Weeks    Status  Achieved  PT LONG TERM GOAL #7    Title  Patient will improve 41mnWT to over 14056fto improve ability to perform endurance based acitivities such as walking to the mailbox without increase in fatigue    Baseline  112075f10/30/2019: 1380f53f Time  6    Period  Weeks    Status  On-going            Plan - 05/25/18 1026    Clinical Impression Statement  Patient demonstrates improvement stepping out exercise today, requiring CGA and minA x 2 during the exercise. Patient demonstrates improvement in balance but conitnues to have difficulty with walking over uneven services. Patient will benefit from further skilled therapy to return to prior level of function.     Rehab Potential  Good    Clinical Impairments Affecting Rehab Potential  Positive: motivation; Negative: , age    PT Frequency  2x / week    PT Duration  6 weeks    PT Treatment/Interventions  ADLs/Self Care Home Management;Aquatic Therapy;Biofeedback;DME Instruction;Gait training;Stair training;Functional mobility training;Therapeutic activities;Therapeutic exercise;Balance training;Neuromuscular re-education;Patient/family education;Passive range of motion;Energy conservation;Ultrasound;Moist Heat;Iontophoresis 4mg/26mDexamethasone;Cryotherapy;Electrical Stimulation;Manual techniques;Dry needling    PT Next Visit Plan  progress core and back strengthening    PT Home Exercise Plan  see education section    Consulted and Agree with Plan of Care  Patient       Patient will benefit from skilled therapeutic intervention in order to improve the following deficits and impairments:  Pain, Increased fascial restricitons, Decreased coordination, Decreased mobility, Decreased activity tolerance, Decreased endurance, Decreased range of motion, Decreased strength, Hypomobility, Postural dysfunction, Abnormal gait, Decreased balance, Difficulty walking  Visit Diagnosis: Muscle weakness (generalized)  History of falling     Problem List Patient Active Problem List    Diagnosis Date Noted  . Chronic bilateral low back pain without sciatica 12/31/2017  . Hypomagnesemia 09/29/2017  . Hypokalemia 09/29/2017  . Atrial fibrillation (HCC) [place]22/2019  . Trapezius strain, right, initial encounter 09/10/2017  . Depression, major, single episode, mild (HCC) [place]22/2019  . Chronic venous insufficiency 09/09/2017  . Lymphedema 09/09/2017  . Pain of toe of left foot 08/17/2017  . Grief 08/17/2017  . Epistaxis 07/31/2017  . Leg swelling 07/31/2017  . Dry eyes 07/06/2017  . Pain and swelling of lower extremity, right 03/29/2017  . Right shoulder pain 03/29/2017  . Diarrhea 05/18/2016  . Fall 05/01/2016  . Unintentional weight loss 12/30/2015  . Leg weakness, bilateral 12/30/2015  . Hyperkalemia 01/01/2015  . Occasional numbness/prickling/tingling of fingers and toes 12/18/2014  . Routine general medical examination at a health care facility 12/13/2013  . Macrocytic anemia 05/19/2013  . Psoriatic arthritis (HCC) [place]18/2014  . Psoriasis   . Osteoarthritis, multiple sites   . Episodic mood disorder (HCC) [place]14/2013  . History of breast cancer 10/28/2011  . GERD (gastroesophageal reflux disease)   . Hyperlipidemia   . Hypertension   . Osteopenia   . Carotid stenosis 05/27/2011    WesleBlythe StanfordDPT 05/25/2018, 10:30 AM  [facility] [address] [id number]: [phone]:  [phone]: ChrisDEMYA Harris [phone] of Birth: 8/29/Sep 04, 1940

## 2018-05-31 ENCOUNTER — Ambulatory Visit: Payer: Medicare Other

## 2018-05-31 DIAGNOSIS — M545 Low back pain, unspecified: Secondary | ICD-10-CM

## 2018-05-31 DIAGNOSIS — Z9181 History of falling: Secondary | ICD-10-CM | POA: Diagnosis not present

## 2018-05-31 DIAGNOSIS — M6281 Muscle weakness (generalized): Secondary | ICD-10-CM

## 2018-05-31 NOTE — Therapy (Signed)
[facility] [address] [place], [address] Phone: [phone]   Fax:  [phone]  Physical Therapy Treatment  Patient Details  Name: [name] MRN: [id number] Date of Birth: November 06, 1940 Referring Provider (PT): Caryl Bis MD   Encounter Date: 05/31/2018  PT End of Session - 05/31/18 1643    Visit Number  39    Number of Visits  47    Date for PT Re-Evaluation  06/30/18    Authorization Type  3/10    PT Start Time  0254    PT Stop Time  1655    PT Time Calculation (min)  38 min    Activity Tolerance  Patient tolerated treatment well    Behavior During Therapy  [facility] for tasks assessed/performed       Past Medical History:  Diagnosis Date  . (HFpEF) heart failure with preserved ejection fraction ([place])    a. 08/2017 Echo: EF 55-60%, no rwma, mild MR, mildly dil LA, nl RV fxn.  . Breast cancer ([place]) 2001   left breast  . Cancer ([place]) 2001   left breast ca  . Carotid arterial disease ([place])    a. 10/2004 s/p L CEA; b. 12/2015 Carotid U/S: RICA 1-39%; b. LICA patent CEA site.  Marland Kitchen GERD (gastroesophageal reflux disease)   . Hyperlipidemia   . Hypertension   . Osteoarthritis, multiple sites   . Osteopenia   . Persistent atrial fibrillation    a. Dx 08/2017; b. CHA2DS2VASc = 6-->Xarelto; c. 09/2017 Successful DCCV (second shock - 200J);   Marland Kitchen Personal history of radiation therapy 2001   left breast ca  . Psoriasis     Past Surgical History:  Procedure Laterality Date  . ABDOMINAL HYSTERECTOMY    . ANKLE FRACTURE SURGERY  4/08   left---hardware still in place  . 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 BREAST EXCISIONAL BIOPSY Left yrs ago   benign  . BREAST LUMPECTOMY Left 2001   f/u radiation  . CARDIOVERSION N/A 10/04/2017   Procedure: CARDIOVERSION;  Surgeon: Wellington Hampshire, MD;  Location: [facility] ORS;  Service: Cardiovascular;  Laterality: N/A;  . CARDIOVERSION N/A 12/16/2017   Procedure:  CARDIOVERSION;  Surgeon: [name], MD;  Location: [facility] ORS;  Service: Cardiovascular;  Laterality: N/A;  . CAROTID ENDARTERECTOMY Left 10/23/2004  . FRACTURE SURGERY    . OOPHORECTOMY    . SHOULDER SURGERY  6/07   left  . TONSILLECTOMY AND ADENOIDECTOMY    . TOTAL HIP ARTHROPLASTY  2004   right    There were no vitals filed for this visit.  Subjective Assessment - 05/31/18 1640    Subjective  Patient states she continues to be active and has worked on improving her ambulation this weekend with walking around a museum with her daughter.     Pertinent History  Patient previously seen for balance difficulties and shoulder pain and the right.  Patient reports no falls in the past six months. Patient reports pain has not improved for the past month and has been about the same since the onset of pain.     Limitations  Walking    Patient Stated Goals  To improve balance when walking     Currently in Pain?  No/denies    Pain Onset  More than a month ago       TREATMENT Therapeutic Exercise: LTRS in hooklying - x 2 minutes Hooklying bridges - x30 Step ups  onto 6" step - 2 x 10 with UE support Squats with cone pickups on airex pad - 4 x 8  Hip abduction in standing at hip machine - 2 x 10 B 40# Reach behind cone grabs in standing on airex pad - 2 x 10  Running man with furniture sliders - 2 x 10  Sit to stands with TRX - 2 x 12 Side stepping across airex beam - x 5 79f    Patient demonstrates increased fatigue at the end of the session   PT Education - 05/31/18 1642    Education provided  Yes    Education Details  form/technique with exercise    Person(s) Educated  Patient    Methods  Explanation;Demonstration    Comprehension  Verbalized understanding;Returned demonstration          PT Long Term Goals - 05/18/18 1026      PT LONG TERM GOAL #1   Title  Patient will be independent with HEP to continue benefits of therapy after discharge.     Baseline  Dependent with  form and technique for balancing exercises; moderate cueing for balance and strengthening; 02/14/2018: Independent with HEP     Time  4    Period  Weeks    Status  Achieved      PT LONG TERM GOAL #2   Title  Patient will improve FGA to over 26/30 to indicate functional improvement in strength and balance limitations and decrease fall risk    Baseline  22/30; ; 09/01/2017: 24/30; 09/30/2017: 25/30; 11/03/2017: 25/30; 02/14/2018: deferred to next visit; 03/16/2018: 28/30    Time  8    Period  Weeks    Status  Achieved      PT LONG TERM GOAL #3   Title  Patient will improve 5xSTS to under 16 seconds without use of a pillow to indicate functional improvement of LE function and decreased fall risk    Baseline  19 sec with pillow under chair; 09/01/2017 12 seconds with use of a pillow; 09/30/2017: 10.9 sec with use of pillow; defered today secondary to increased soreness from walking the previous day    Time  8    Period  Weeks    Status  Partially Met      PT LONG TERM GOAL #4   Title  Patient will be able to balance for 10sec with SLS to improve static balance and ability to get dressed when in standing    Baseline  1 sec; 09/01/2017: 3 sec each leg; 09/30/17: 4.5; 11/03/2017: 5 sec; 02/14/2018: Deffered to next visit; 03/16/2018: 7sec; 04/18/2018:7 sec; 05/18/2018; 5sec    Time  8    Period  Weeks    Status  On-going      PT LONG TERM GOAL #5   Title  Patient will have a worst lumbar pain of a 3/10 over the past week to indicate functional improvement with performing standing activities and prolonged sitting.    Baseline  Worst pain: 8/10; 02/14/2018: worst pain: 7/10; 03/16/2018: 3/10    Time  6    Period  Weeks    Status  Achieved      PT LONG TERM GOAL #6   Title  Patient will be able to sit for >1 hour to improve abiltity to drive without increase in pain.    Baseline  Increased pain after driving for <<55DDU 72/08/5425 Patient reports increase in pain for 323m; 03/16/2018: 1 hour    Time  6  Period  Weeks    Status  Achieved      PT LONG TERM GOAL #7   Title  Patient will improve 60mnWT to over 14082fto improve ability to perform endurance based acitivities such as walking to the mailbox without increase in fatigue    Baseline  112020f10/30/2019: 1380f60f Time  6    Period  Weeks    Status  On-going            Plan - 05/31/18 1647    Clinical Impression Statement  Focused on improving LE strengthening with exercises today as patient continues to feel fatigued with prolonged ambulation. Patient demonstrates improvement with balancing with ability to perofrm exercises on the airex beam without UE support. Although she is improving, she continues to demonstrate poor strength with hip motions and will benefit from further skilled therapy to return to prior level of function.     Rehab Potential  Good    Clinical Impairments Affecting Rehab Potential  Positive: motivation; Negative: , age    PT Frequency  2x / week    PT Duration  6 weeks    PT Treatment/Interventions  ADLs/Self Care Home Management;Aquatic Therapy;Biofeedback;DME Instruction;Gait training;Stair training;Functional mobility training;Therapeutic activities;Therapeutic exercise;Balance training;Neuromuscular re-education;Patient/family education;Passive range of motion;Energy conservation;Ultrasound;Moist Heat;Iontophoresis 4mg/87mDexamethasone;Cryotherapy;Electrical Stimulation;Manual techniques;Dry needling    PT Next Visit Plan  progress core and back strengthening    PT Home Exercise Plan  see education section    Consulted and Agree with Plan of Care  Patient       Patient will benefit from skilled therapeutic intervention in order to improve the following deficits and impairments:  Pain, Increased fascial restricitons, Decreased coordination, Decreased mobility, Decreased activity tolerance, Decreased endurance, Decreased range of motion, Decreased strength, Hypomobility, Postural dysfunction, Abnormal  gait, Decreased balance, Difficulty walking  Visit Diagnosis: Muscle weakness (generalized)  History of falling  Acute bilateral low back pain without sciatica     Problem List Patient Active Problem List   Diagnosis Date Noted  . Chronic bilateral low back pain without sciatica 12/31/2017  . Hypomagnesemia 09/29/2017  . Hypokalemia 09/29/2017  . Atrial fibrillation (HCC) [place]22/2019  . Trapezius strain, right, initial encounter 09/10/2017  . Depression, major, single episode, mild (HCC) [place]22/2019  . Chronic venous insufficiency 09/09/2017  . Lymphedema 09/09/2017  . Pain of toe of left foot 08/17/2017  . Grief 08/17/2017  . Epistaxis 07/31/2017  . Leg swelling 07/31/2017  . Dry eyes 07/06/2017  . Pain and swelling of lower extremity, right 03/29/2017  . Right shoulder pain 03/29/2017  . Diarrhea 05/18/2016  . Fall 05/01/2016  . Unintentional weight loss 12/30/2015  . Leg weakness, bilateral 12/30/2015  . Hyperkalemia 01/01/2015  . Occasional numbness/prickling/tingling of fingers and toes 12/18/2014  . Routine general medical examination at a health care facility 12/13/2013  . Macrocytic anemia 05/19/2013  . Psoriatic arthritis (HCC) [place]18/2014  . Psoriasis   . Osteoarthritis, multiple sites   . Episodic mood disorder (HCC) [place]14/2013  . History of breast cancer 10/28/2011  . GERD (gastroesophageal reflux disease)   . Hyperlipidemia   . Hypertension   . Osteopenia   . Carotid stenosis 05/27/2011    WesleBlythe StanfordDPT 05/31/2018, 4:53 PM  [facility] [address] [id number]: [phone]:  [phone]: ChrisDAVIANNA DEUTSCHMAN [phone] of Birth: 03/17/05/27/1942

## 2018-06-05 ENCOUNTER — Other Ambulatory Visit: Payer: Self-pay | Admitting: Family Medicine

## 2018-06-07 ENCOUNTER — Ambulatory Visit: Payer: Medicare Other

## 2018-06-07 DIAGNOSIS — M545 Low back pain, unspecified: Secondary | ICD-10-CM

## 2018-06-07 DIAGNOSIS — M6281 Muscle weakness (generalized): Secondary | ICD-10-CM

## 2018-06-07 DIAGNOSIS — Z9181 History of falling: Secondary | ICD-10-CM

## 2018-06-07 NOTE — Therapy (Signed)
[facility] [address] [place], [address] Phone: [phone]   Fax:  [phone]  Physical Therapy Treatment  Patient Details  Name: [name] MRN: [id number] Date of Birth: January 14, 1941 Referring Provider (PT): Caryl Bis MD   Encounter Date: 06/07/2018  PT End of Session - 06/07/18 1620    Visit Number  40    Number of Visits  47    Date for PT Re-Evaluation  06/30/18    Authorization Type  4/10    PT Start Time  6063    PT Stop Time  1600    PT Time Calculation (min)  45 min    Activity Tolerance  Patient tolerated treatment well    Behavior During Therapy  [facility] for tasks assessed/performed       Past Medical History:  Diagnosis Date  . (HFpEF) heart failure with preserved ejection fraction ([place])    a. 08/2017 Echo: EF 55-60%, no rwma, mild MR, mildly dil LA, nl RV fxn.  . Breast cancer ([place]) 2001   left breast  . Cancer ([place]) 2001   left breast ca  . Carotid arterial disease ([place])    a. 10/2004 s/p L CEA; b. 12/2015 Carotid U/S: RICA 1-39%; b. LICA patent CEA site.  Marland Kitchen GERD (gastroesophageal reflux disease)   . Hyperlipidemia   . Hypertension   . Osteoarthritis, multiple sites   . Osteopenia   . Persistent atrial fibrillation    a. Dx 08/2017; b. CHA2DS2VASc = 6-->Xarelto; c. 09/2017 Successful DCCV (second shock - 200J);   Marland Kitchen Personal history of radiation therapy 2001   left breast ca  . Psoriasis     Past Surgical History:  Procedure Laterality Date  . ABDOMINAL HYSTERECTOMY    . ANKLE FRACTURE SURGERY  4/08   left---hardware still in place  . 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 BREAST EXCISIONAL BIOPSY Left yrs ago   benign  . BREAST LUMPECTOMY Left 2001   f/u radiation  . CARDIOVERSION N/A 10/04/2017   Procedure: CARDIOVERSION;  Surgeon: Wellington Hampshire, MD;  Location: [facility] ORS;  Service: Cardiovascular;  Laterality: N/A;  . CARDIOVERSION N/A 12/16/2017   Procedure:  CARDIOVERSION;  Surgeon: [name], MD;  Location: [facility] ORS;  Service: Cardiovascular;  Laterality: N/A;  . CAROTID ENDARTERECTOMY Left 10/23/2004  . FRACTURE SURGERY    . OOPHORECTOMY    . SHOULDER SURGERY  6/07   left  . TONSILLECTOMY AND ADENOIDECTOMY    . TOTAL HIP ARTHROPLASTY  2004   right    There were no vitals filed for this visit.  Subjective Assessment - 06/07/18 1619    Subjective  Patient reports she's been performing exercises but states difficulty with walking secondary to increased pain.     Pertinent History  Patient previously seen for balance difficulties and shoulder pain and the right.  Patient reports no falls in the past six months. Patient reports pain has not improved for the past month and has been about the same since the onset of pain.     Limitations  Walking    Patient Stated Goals  To improve balance when walking     Currently in Pain?  No/denies    Pain Onset  More than a month ago         TREATMENT Therapeutic Exercise: LTRS in hooklying - x 2 minutes Hooklying bridges - x30 Reach behind cone grabs in standing on airex pad -  2 x 10  Step ups onto 6" step - 2 x 10 with UE support Squats with cone pickups on airex pad - 2 x 8  Side stepping up and over airex pad - x 10  Side stepping across airex beam - x 10  Tandem ambulation across airex beam - x 6 TRX sit to stands with UE support - 2 x 10     Patient demonstrates increased fatigue at the end of the session   PT Education - 06/07/18 1619    Education provided  Yes    Education Details  form/technique with exercise    Person(s) Educated  Patient    Methods  Explanation;Demonstration    Comprehension  Verbalized understanding;Returned demonstration          PT Long Term Goals - 05/18/18 1026      PT LONG TERM GOAL #1   Title  Patient will be independent with HEP to continue benefits of therapy after discharge.     Baseline  Dependent with form and technique for balancing  exercises; moderate cueing for balance and strengthening; 02/14/2018: Independent with HEP     Time  4    Period  Weeks    Status  Achieved      PT LONG TERM GOAL #2   Title  Patient will improve FGA to over 26/30 to indicate functional improvement in strength and balance limitations and decrease fall risk    Baseline  22/30; ; 09/01/2017: 24/30; 09/30/2017: 25/30; 11/03/2017: 25/30; 02/14/2018: deferred to next visit; 03/16/2018: 28/30    Time  8    Period  Weeks    Status  Achieved      PT LONG TERM GOAL #3   Title  Patient will improve 5xSTS to under 16 seconds without use of a pillow to indicate functional improvement of LE function and decreased fall risk    Baseline  19 sec with pillow under chair; 09/01/2017 12 seconds with use of a pillow; 09/30/2017: 10.9 sec with use of pillow; defered today secondary to increased soreness from walking the previous day    Time  8    Period  Weeks    Status  Partially Met      PT LONG TERM GOAL #4   Title  Patient will be able to balance for 10sec with SLS to improve static balance and ability to get dressed when in standing    Baseline  1 sec; 09/01/2017: 3 sec each leg; 09/30/17: 4.5; 11/03/2017: 5 sec; 02/14/2018: Deffered to next visit; 03/16/2018: 7sec; 04/18/2018:7 sec; 05/18/2018; 5sec    Time  8    Period  Weeks    Status  On-going      PT LONG TERM GOAL #5   Title  Patient will have a worst lumbar pain of a 3/10 over the past week to indicate functional improvement with performing standing activities and prolonged sitting.    Baseline  Worst pain: 8/10; 02/14/2018: worst pain: 7/10; 03/16/2018: 3/10    Time  6    Period  Weeks    Status  Achieved      PT LONG TERM GOAL #6   Title  Patient will be able to sit for >1 hour to improve abiltity to drive without increase in pain.    Baseline  Increased pain after driving for <15VVO; 1/60/7371: Patient reports increase in pain for 6mn; 03/16/2018: 1 hour    Time  6    Period  Weeks  Status   Achieved      PT LONG TERM GOAL #7   Title  Patient will improve 9mnWT to over 14076fto improve ability to perform endurance based acitivities such as walking to the mailbox without increase in fatigue    Baseline  11203f10/30/2019: 1380f7f Time  6    Period  Weeks    Status  On-going            Plan - 06/07/18 1620    Clinical Impression Statement  Patient demonstrates improvement with balance with ability to perform balance based exercises without as much UE support. Patient continues to demonstrate poor balance leaning backwards indicating poor static/dynamic balance in higher risk of falling. Patient will benefit from further skilled therapy to return to prior level of function.     Rehab Potential  Good    Clinical Impairments Affecting Rehab Potential  Positive: motivation; Negative: , age    PT Frequency  2x / week    PT Duration  6 weeks    PT Treatment/Interventions  ADLs/Self Care Home Management;Aquatic Therapy;Biofeedback;DME Instruction;Gait training;Stair training;Functional mobility training;Therapeutic activities;Therapeutic exercise;Balance training;Neuromuscular re-education;Patient/family education;Passive range of motion;Energy conservation;Ultrasound;Moist Heat;Iontophoresis 4mg/74mDexamethasone;Cryotherapy;Electrical Stimulation;Manual techniques;Dry needling    PT Next Visit Plan  progress core and back strengthening    PT Home Exercise Plan  see education section    Consulted and Agree with Plan of Care  Patient       Patient will benefit from skilled therapeutic intervention in order to improve the following deficits and impairments:  Pain, Increased fascial restricitons, Decreased coordination, Decreased mobility, Decreased activity tolerance, Decreased endurance, Decreased range of motion, Decreased strength, Hypomobility, Postural dysfunction, Abnormal gait, Decreased balance, Difficulty walking  Visit Diagnosis: Muscle weakness  (generalized)  History of falling  Acute bilateral low back pain without sciatica     Problem List Patient Active Problem List   Diagnosis Date Noted  . Chronic bilateral low back pain without sciatica 12/31/2017  . Hypomagnesemia 09/29/2017  . Hypokalemia 09/29/2017  . Atrial fibrillation (HCC) [place]22/2019  . Trapezius strain, right, initial encounter 09/10/2017  . Depression, major, single episode, mild (HCC) [place]22/2019  . Chronic venous insufficiency 09/09/2017  . Lymphedema 09/09/2017  . Pain of toe of left foot 08/17/2017  . Grief 08/17/2017  . Epistaxis 07/31/2017  . Leg swelling 07/31/2017  . Dry eyes 07/06/2017  . Pain and swelling of lower extremity, right 03/29/2017  . Right shoulder pain 03/29/2017  . Diarrhea 05/18/2016  . Fall 05/01/2016  . Unintentional weight loss 12/30/2015  . Leg weakness, bilateral 12/30/2015  . Hyperkalemia 01/01/2015  . Occasional numbness/prickling/tingling of fingers and toes 12/18/2014  . Routine general medical examination at a health care facility 12/13/2013  . Macrocytic anemia 05/19/2013  . Psoriatic arthritis (HCC) [place]18/2014  . Psoriasis   . Osteoarthritis, multiple sites   . Episodic mood disorder (HCC) [place]14/2013  . History of breast cancer 10/28/2011  . GERD (gastroesophageal reflux disease)   . Hyperlipidemia   . Hypertension   . Osteopenia   . Carotid stenosis 05/27/2011    WesleBlythe StanfordDPT 06/07/2018, 4:23 PM  [facility] [address] [id number]: [phone]:  [phone]: ChrisALORAH MCREE [phone] of Birth: 03/17/00-Jun-1942

## 2018-06-09 ENCOUNTER — Ambulatory Visit: Payer: Medicare Other

## 2018-06-09 DIAGNOSIS — Z9181 History of falling: Secondary | ICD-10-CM

## 2018-06-09 DIAGNOSIS — M545 Low back pain, unspecified: Secondary | ICD-10-CM

## 2018-06-09 DIAGNOSIS — M6281 Muscle weakness (generalized): Secondary | ICD-10-CM | POA: Diagnosis not present

## 2018-06-09 DIAGNOSIS — H353132 Nonexudative age-related macular degeneration, bilateral, intermediate dry stage: Secondary | ICD-10-CM | POA: Diagnosis not present

## 2018-06-09 NOTE — Therapy (Signed)
[facility] [address] [place], [address] Phone: [phone]   Fax:  [phone]  Physical Therapy Treatment  Patient Details  Name: [name] MRN: [id number] Date of Birth: May 03, 1941 Referring Provider (PT): Caryl Bis MD   Encounter Date: 06/09/2018  PT End of Session - 06/09/18 1620    Visit Number  41    Number of Visits  47    Date for PT Re-Evaluation  06/30/18    Authorization Type  5/10    PT Start Time  1602    PT Stop Time  1645    PT Time Calculation (min)  43 min    Activity Tolerance  Patient tolerated treatment well    Behavior During Therapy  [facility] for tasks assessed/performed       Past Medical History:  Diagnosis Date  . (HFpEF) heart failure with preserved ejection fraction ([place])    a. 08/2017 Echo: EF 55-60%, no rwma, mild MR, mildly dil LA, nl RV fxn.  . Breast cancer ([place]) 2001   left breast  . Cancer ([place]) 2001   left breast ca  . Carotid arterial disease ([place])    a. 10/2004 s/p L CEA; b. 12/2015 Carotid U/S: RICA 1-39%; b. LICA patent CEA site.  Marland Kitchen GERD (gastroesophageal reflux disease)   . Hyperlipidemia   . Hypertension   . Osteoarthritis, multiple sites   . Osteopenia   . Persistent atrial fibrillation    a. Dx 08/2017; b. CHA2DS2VASc = 6-->Xarelto; c. 09/2017 Successful DCCV (second shock - 200J);   Marland Kitchen Personal history of radiation therapy 2001   left breast ca  . Psoriasis     Past Surgical History:  Procedure Laterality Date  . ABDOMINAL HYSTERECTOMY    . ANKLE FRACTURE SURGERY  4/08   left---hardware still in place  . 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 BREAST EXCISIONAL BIOPSY Left yrs ago   benign  . BREAST LUMPECTOMY Left 2001   f/u radiation  . CARDIOVERSION N/A 10/04/2017   Procedure: CARDIOVERSION;  Surgeon: Wellington Hampshire, MD;  Location: [facility] ORS;  Service: Cardiovascular;  Laterality: N/A;  . CARDIOVERSION N/A 12/16/2017   Procedure:  CARDIOVERSION;  Surgeon: [name], MD;  Location: [facility] ORS;  Service: Cardiovascular;  Laterality: N/A;  . CAROTID ENDARTERECTOMY Left 10/23/2004  . FRACTURE SURGERY    . OOPHORECTOMY    . SHOULDER SURGERY  6/07   left  . TONSILLECTOMY AND ADENOIDECTOMY    . TOTAL HIP ARTHROPLASTY  2004   right    There were no vitals filed for this visit.  Subjective Assessment - 06/09/18 1606    Subjective  Patient reports she has been busy, states no major changes since the previous session. Patient states she is improving overall.     Pertinent History  Patient previously seen for balance difficulties and shoulder pain and the right.  Patient reports no falls in the past six months. Patient reports pain has not improved for the past month and has been about the same since the onset of pain.     Limitations  Walking    Patient Stated Goals  To improve balance when walking     Currently in Pain?  No/denies    Pain Onset  More than a month ago        TREATMENT Therapeutic Exercise: LTRS in hooklying - x 2 minutes Hooklying bridges - x30 Reach behind cone grabs in  standing on airex pad - 2 x 10  Step ups onto 6" step on double pad -  x 10 with UE support Squats with cone pickups on airex pad - 2 x 8  Side stepping up and over airex pad - x 10  Hip abduction against hip machine - 2 x 10 40# Forward stepping over balance stones (7) - x6  Standing static balance on mini bosu ball - 6 x 30sec  Heel raises off of airex beam - x 30 with intermittent UE support  Tandem ambulation- 2 x 6 TRX sit to stands with UE support - 3 x 10      Patient demonstrates increased fatigue at the end of the session   PT Education - 06/09/18 1620    Education provided  Yes    Education Details  form/technique with exercise     Person(s) Educated  Patient    Methods  Explanation;Demonstration    Comprehension  Verbalized understanding;Returned demonstration          PT Long Term Goals - 05/18/18 1026       PT LONG TERM GOAL #1   Title  Patient will be independent with HEP to continue benefits of therapy after discharge.     Baseline  Dependent with form and technique for balancing exercises; moderate cueing for balance and strengthening; 02/14/2018: Independent with HEP     Time  4    Period  Weeks    Status  Achieved      PT LONG TERM GOAL #2   Title  Patient will improve FGA to over 26/30 to indicate functional improvement in strength and balance limitations and decrease fall risk    Baseline  22/30; ; 09/01/2017: 24/30; 09/30/2017: 25/30; 11/03/2017: 25/30; 02/14/2018: deferred to next visit; 03/16/2018: 28/30    Time  8    Period  Weeks    Status  Achieved      PT LONG TERM GOAL #3   Title  Patient will improve 5xSTS to under 16 seconds without use of a pillow to indicate functional improvement of LE function and decreased fall risk    Baseline  19 sec with pillow under chair; 09/01/2017 12 seconds with use of a pillow; 09/30/2017: 10.9 sec with use of pillow; defered today secondary to increased soreness from walking the previous day    Time  8    Period  Weeks    Status  Partially Met      PT LONG TERM GOAL #4   Title  Patient will be able to balance for 10sec with SLS to improve static balance and ability to get dressed when in standing    Baseline  1 sec; 09/01/2017: 3 sec each leg; 09/30/17: 4.5; 11/03/2017: 5 sec; 02/14/2018: Deffered to next visit; 03/16/2018: 7sec; 04/18/2018:7 sec; 05/18/2018; 5sec    Time  8    Period  Weeks    Status  On-going      PT LONG TERM GOAL #5   Title  Patient will have a worst lumbar pain of a 3/10 over the past week to indicate functional improvement with performing standing activities and prolonged sitting.    Baseline  Worst pain: 8/10; 02/14/2018: worst pain: 7/10; 03/16/2018: 3/10    Time  6    Period  Weeks    Status  Achieved      PT LONG TERM GOAL #6   Title  Patient will be able to sit for >1 hour to improve abiltity  to drive without increase  in pain.    Baseline  Increased pain after driving for <35TDD; 09/08/2540: Patient reports increase in pain for 60mn; 03/16/2018: 1 hour    Time  6    Period  Weeks    Status  Achieved      PT LONG TERM GOAL #7   Title  Patient will improve 627mWT to over 140027fo improve ability to perform endurance based acitivities such as walking to the mailbox without increase in fatigue    Baseline  1120f3f0/30/2019: 1380ft68fTime  6    Period  Weeks    Status  On-going            Plan - 06/09/18 1627    Clinical Impression Statement  Continued to address dynamic balance and patient continues to require UE support for high level dynamic balance but is able to perform tandem amb without UE support indicating functional carryover. Patient conitnues to demosntrate poor LE strength and will benefit from further skilled therapy to return to prior level of function.      Rehab Potential  Good    Clinical Impairments Affecting Rehab Potential  Positive: motivation; Negative: , age    PT Frequency  2x / week    PT Duration  6 weeks    PT Treatment/Interventions  ADLs/Self Care Home Management;Aquatic Therapy;Biofeedback;DME Instruction;Gait training;Stair training;Functional mobility training;Therapeutic activities;Therapeutic exercise;Balance training;Neuromuscular re-education;Patient/family education;Passive range of motion;Energy conservation;Ultrasound;Moist Heat;Iontophoresis 4mg/m53mexamethasone;Cryotherapy;Electrical Stimulation;Manual techniques;Dry needling    PT Next Visit Plan  progress core and back strengthening    PT Home Exercise Plan  see education section    Consulted and Agree with Plan of Care  Patient       Patient will benefit from skilled therapeutic intervention in order to improve the following deficits and impairments:  Pain, Increased fascial restricitons, Decreased coordination, Decreased mobility, Decreased activity tolerance, Decreased endurance, Decreased range of  motion, Decreased strength, Hypomobility, Postural dysfunction, Abnormal gait, Decreased balance, Difficulty walking  Visit Diagnosis: Muscle weakness (generalized)  History of falling  Acute bilateral low back pain without sciatica     Problem List Patient Active Problem List   Diagnosis Date Noted  . Chronic bilateral low back pain without sciatica 12/31/2017  . Hypomagnesemia 09/29/2017  . Hypokalemia 09/29/2017  . Atrial fibrillation (HCC) 0[place]2/2019  . Trapezius strain, right, initial encounter 09/10/2017  . Depression, major, single episode, mild (HCC) 0[place]2/2019  . Chronic venous insufficiency 09/09/2017  . Lymphedema 09/09/2017  . Pain of toe of left foot 08/17/2017  . Grief 08/17/2017  . Epistaxis 07/31/2017  . Leg swelling 07/31/2017  . Dry eyes 07/06/2017  . Pain and swelling of lower extremity, right 03/29/2017  . Right shoulder pain 03/29/2017  . Diarrhea 05/18/2016  . Fall 05/01/2016  . Unintentional weight loss 12/30/2015  . Leg weakness, bilateral 12/30/2015  . Hyperkalemia 01/01/2015  . Occasional numbness/prickling/tingling of fingers and toes 12/18/2014  . Routine general medical examination at a health care facility 12/13/2013  . Macrocytic anemia 05/19/2013  . Psoriatic arthritis (HCC) 0[place]8/2014  . Psoriasis   . Osteoarthritis, multiple sites   . Episodic mood disorder (HCC) 0[place]4/2013  . History of breast cancer 10/28/2011  . GERD (gastroesophageal reflux disease)   . Hyperlipidemia   . Hypertension   . Osteopenia   . Carotid stenosis 05/27/2011    WesleyBlythe StanfordPT 06/09/2018, 4:43 PM  [facility] [address]  [place], [place], [address] Phone: [phone]   Fax:  [phone]  Name: ROYA GIESELMAN MRN: [id number] Date of Birth: Jan 23, 1941

## 2018-06-14 ENCOUNTER — Ambulatory Visit: Payer: Medicare Other

## 2018-06-15 ENCOUNTER — Ambulatory Visit: Payer: Medicare Other

## 2018-06-15 DIAGNOSIS — Z9181 History of falling: Secondary | ICD-10-CM

## 2018-06-15 DIAGNOSIS — M545 Low back pain: Secondary | ICD-10-CM | POA: Diagnosis not present

## 2018-06-15 DIAGNOSIS — M6281 Muscle weakness (generalized): Secondary | ICD-10-CM

## 2018-06-15 NOTE — Therapy (Signed)
[facility] [address] [place], [address] Phone: [phone]   Fax:  [phone]  Physical Therapy Treatment  Patient Details  Name: [name] MRN: [id number] Date of Birth: 07-10-1941 Referring Provider (PT): Caryl Bis MD   Encounter Date: 06/15/2018  PT End of Session - 06/15/18 1025    Visit Number  42    Number of Visits  47    Date for PT Re-Evaluation  06/30/18    Authorization Type  6/10    PT Start Time  0945    PT Stop Time  1030    PT Time Calculation (min)  45 min    Activity Tolerance  Patient tolerated treatment well    Behavior During Therapy  [facility] for tasks assessed/performed       Past Medical History:  Diagnosis Date  . (HFpEF) heart failure with preserved ejection fraction ([place])    a. 08/2017 Echo: EF 55-60%, no rwma, mild MR, mildly dil LA, nl RV fxn.  . Breast cancer ([place]) 2001   left breast  . Cancer ([place]) 2001   left breast ca  . Carotid arterial disease ([place])    a. 10/2004 s/p L CEA; b. 12/2015 Carotid U/S: RICA 1-39%; b. LICA patent CEA site.  Marland Kitchen GERD (gastroesophageal reflux disease)   . Hyperlipidemia   . Hypertension   . Osteoarthritis, multiple sites   . Osteopenia   . Persistent atrial fibrillation    a. Dx 08/2017; b. CHA2DS2VASc = 6-->Xarelto; c. 09/2017 Successful DCCV (second shock - 200J);   Marland Kitchen Personal history of radiation therapy 2001   left breast ca  . Psoriasis     Past Surgical History:  Procedure Laterality Date  . ABDOMINAL HYSTERECTOMY    . ANKLE FRACTURE SURGERY  4/08   left---hardware still in place  . 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 BREAST EXCISIONAL BIOPSY Left yrs ago   benign  . BREAST LUMPECTOMY Left 2001   f/u radiation  . CARDIOVERSION N/A 10/04/2017   Procedure: CARDIOVERSION;  Surgeon: Wellington Hampshire, MD;  Location: [facility] ORS;  Service: Cardiovascular;  Laterality: N/A;  . CARDIOVERSION N/A 12/16/2017   Procedure:  CARDIOVERSION;  Surgeon: [name], MD;  Location: [facility] ORS;  Service: Cardiovascular;  Laterality: N/A;  . CAROTID ENDARTERECTOMY Left 10/23/2004  . FRACTURE SURGERY    . OOPHORECTOMY    . SHOULDER SURGERY  6/07   left  . TONSILLECTOMY AND ADENOIDECTOMY    . TOTAL HIP ARTHROPLASTY  2004   right    There were no vitals filed for this visit.  Subjective Assessment - 06/15/18 1020    Subjective  Patient reports she is upset today since her friend had passed yesterday. Patient continues to report improvemnt overall.     Pertinent History  Patient previously seen for balance difficulties and shoulder pain and the right.  Patient reports no falls in the past six months. Patient reports pain has not improved for the past month and has been about the same since the onset of pain.     Limitations  Walking    Patient Stated Goals  To improve balance when walking     Currently in Pain?  No/denies    Pain Onset  More than a month ago        TREATMENT Therapeutic Exercise: LTRS in hooklying - x 2 minutes Hooklying bridges - x30 Reach behind cone grabs in standing on  airex pad - 2 x 10  Step ups onto 6" step on double pad -  x 10 with UE support Squats with cone pickups on airex pad - 2 x 8  Standing static balance on mini bosu ball - 6 x 30sec  Standing feet together balance on airex pad - x 25 Hip abduction against hip machine - 2 x 10 40# TRX sit to stands with UE support - 3 x 10   Patient demonstrates increased fatigue at the end of the session  PT Education - 06/15/18 1024    Education provided  Yes    Education Details  form/technique with exercise    Person(s) Educated  Patient    Methods  Explanation;Demonstration    Comprehension  Verbalized understanding;Returned demonstration          PT Long Term Goals - 05/18/18 1026      PT LONG TERM GOAL #1   Title  Patient will be independent with HEP to continue benefits of therapy after discharge.     Baseline  Dependent  with form and technique for balancing exercises; moderate cueing for balance and strengthening; 02/14/2018: Independent with HEP     Time  4    Period  Weeks    Status  Achieved      PT LONG TERM GOAL #2   Title  Patient will improve FGA to over 26/30 to indicate functional improvement in strength and balance limitations and decrease fall risk    Baseline  22/30; ; 09/01/2017: 24/30; 09/30/2017: 25/30; 11/03/2017: 25/30; 02/14/2018: deferred to next visit; 03/16/2018: 28/30    Time  8    Period  Weeks    Status  Achieved      PT LONG TERM GOAL #3   Title  Patient will improve 5xSTS to under 16 seconds without use of a pillow to indicate functional improvement of LE function and decreased fall risk    Baseline  19 sec with pillow under chair; 09/01/2017 12 seconds with use of a pillow; 09/30/2017: 10.9 sec with use of pillow; defered today secondary to increased soreness from walking the previous day    Time  8    Period  Weeks    Status  Partially Met      PT LONG TERM GOAL #4   Title  Patient will be able to balance for 10sec with SLS to improve static balance and ability to get dressed when in standing    Baseline  1 sec; 09/01/2017: 3 sec each leg; 09/30/17: 4.5; 11/03/2017: 5 sec; 02/14/2018: Deffered to next visit; 03/16/2018: 7sec; 04/18/2018:7 sec; 05/18/2018; 5sec    Time  8    Period  Weeks    Status  On-going      PT LONG TERM GOAL #5   Title  Patient will have a worst lumbar pain of a 3/10 over the past week to indicate functional improvement with performing standing activities and prolonged sitting.    Baseline  Worst pain: 8/10; 02/14/2018: worst pain: 7/10; 03/16/2018: 3/10    Time  6    Period  Weeks    Status  Achieved      PT LONG TERM GOAL #6   Title  Patient will be able to sit for >1 hour to improve abiltity to drive without increase in pain.    Baseline  Increased pain after driving for <93YBO; 1/75/1025: Patient reports increase in pain for 4mn; 03/16/2018: 1 hour    Time   6  Period  Weeks    Status  Achieved      PT LONG TERM GOAL #7   Title  Patient will improve 109mnWT to over 14070fto improve ability to perform endurance based acitivities such as walking to the mailbox without increase in fatigue    Baseline  112030f10/30/2019: 1380f56f Time  6    Period  Weeks    Status  On-going            Plan - 06/15/18 1029    Clinical Impression Statement  Patient continues to demonstrate decreased balance especially with bending backwards to balance. Although patient has difficulty with performing this motion, she demonstrates improvement overall with her strengthening and endurance. Patient will benefit from further skilled therapy to return to prior level of function.     Rehab Potential  Good    Clinical Impairments Affecting Rehab Potential  Positive: motivation; Negative: , age    PT Frequency  2x / week    PT Duration  6 weeks    PT Treatment/Interventions  ADLs/Self Care Home Management;Aquatic Therapy;Biofeedback;DME Instruction;Gait training;Stair training;Functional mobility training;Therapeutic activities;Therapeutic exercise;Balance training;Neuromuscular re-education;Patient/family education;Passive range of motion;Energy conservation;Ultrasound;Moist Heat;Iontophoresis 4mg/10mDexamethasone;Cryotherapy;Electrical Stimulation;Manual techniques;Dry needling    PT Next Visit Plan  progress core and back strengthening    PT Home Exercise Plan  see education section    Consulted and Agree with Plan of Care  Patient       Patient will benefit from skilled therapeutic intervention in order to improve the following deficits and impairments:  Pain, Increased fascial restricitons, Decreased coordination, Decreased mobility, Decreased activity tolerance, Decreased endurance, Decreased range of motion, Decreased strength, Hypomobility, Postural dysfunction, Abnormal gait, Decreased balance, Difficulty walking  Visit Diagnosis: Muscle weakness  (generalized)  History of falling     Problem List Patient Active Problem List   Diagnosis Date Noted  . Chronic bilateral low back pain without sciatica 12/31/2017  . Hypomagnesemia 09/29/2017  . Hypokalemia 09/29/2017  . Atrial fibrillation (HCC) [place]22/2019  . Trapezius strain, right, initial encounter 09/10/2017  . Depression, major, single episode, mild (HCC) [place]22/2019  . Chronic venous insufficiency 09/09/2017  . Lymphedema 09/09/2017  . Pain of toe of left foot 08/17/2017  . Grief 08/17/2017  . Epistaxis 07/31/2017  . Leg swelling 07/31/2017  . Dry eyes 07/06/2017  . Pain and swelling of lower extremity, right 03/29/2017  . Right shoulder pain 03/29/2017  . Diarrhea 05/18/2016  . Fall 05/01/2016  . Unintentional weight loss 12/30/2015  . Leg weakness, bilateral 12/30/2015  . Hyperkalemia 01/01/2015  . Occasional numbness/prickling/tingling of fingers and toes 12/18/2014  . Routine general medical examination at a health care facility 12/13/2013  . Macrocytic anemia 05/19/2013  . Psoriatic arthritis (HCC) [place]18/2014  . Psoriasis   . Osteoarthritis, multiple sites   . Episodic mood disorder (HCC) [place]14/2013  . History of breast cancer 10/28/2011  . GERD (gastroesophageal reflux disease)   . Hyperlipidemia   . Hypertension   . Osteopenia   . Carotid stenosis 05/27/2011    WesleBlythe StanfordDPT 06/15/2018, 10:32 AM  [facility] [address] [id number]: [phone]  [phone]: [name] [phone] of Birth: 05/11/1941-08-07

## 2018-06-20 ENCOUNTER — Ambulatory Visit: Payer: Medicare Other | Attending: Family Medicine | Admitting: Physical Therapy

## 2018-06-20 DIAGNOSIS — R2681 Unsteadiness on feet: Secondary | ICD-10-CM | POA: Diagnosis not present

## 2018-06-20 DIAGNOSIS — M545 Low back pain, unspecified: Secondary | ICD-10-CM

## 2018-06-20 DIAGNOSIS — Z9181 History of falling: Secondary | ICD-10-CM | POA: Diagnosis not present

## 2018-06-20 DIAGNOSIS — M6281 Muscle weakness (generalized): Secondary | ICD-10-CM | POA: Insufficient documentation

## 2018-06-20 NOTE — Therapy (Signed)
[facility] [address] [place], [address] Phone: [phone]   Fax:  [phone]  Physical Therapy Treatment  Patient Details  Name: [name] MRN: [id number] Date of Birth: 1941/07/01 Referring Provider (PT): Caryl Bis MD   Encounter Date: 06/20/2018  PT End of Session - 06/20/18 1548    Visit Number  43    Number of Visits  47    Date for PT Re-Evaluation  06/30/18    Authorization Type  7/10    PT Start Time  3704    PT Stop Time  1430    PT Time Calculation (min)  38 min    Equipment Utilized During Treatment  Gait belt    Activity Tolerance  Patient tolerated treatment well    Behavior During Therapy  WFL for tasks assessed/performed       Past Medical History:  Diagnosis Date  . (HFpEF) heart failure with preserved ejection fraction ([place])    a. 08/2017 Echo: EF 55-60%, no rwma, mild MR, mildly dil LA, nl RV fxn.  . Breast cancer ([place]) 2001   left breast  . Cancer ([place]) 2001   left breast ca  . Carotid arterial disease ([place])    a. 10/2004 s/p L CEA; b. 12/2015 Carotid U/S: RICA 1-39%; b. LICA patent CEA site.  Marland Kitchen GERD (gastroesophageal reflux disease)   . Hyperlipidemia   . Hypertension   . Osteoarthritis, multiple sites   . Osteopenia   . Persistent atrial fibrillation    a. Dx 08/2017; b. CHA2DS2VASc = 6-->Xarelto; c. 09/2017 Successful DCCV (second shock - 200J);   Marland Kitchen Personal history of radiation therapy 2001   left breast ca  . Psoriasis     Past Surgical History:  Procedure Laterality Date  . ABDOMINAL HYSTERECTOMY    . ANKLE FRACTURE SURGERY  4/08   left---hardware still in place  . 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 BREAST EXCISIONAL BIOPSY Left yrs ago   benign  . BREAST LUMPECTOMY Left 2001   f/u radiation  . CARDIOVERSION N/A 10/04/2017   Procedure: CARDIOVERSION;  Surgeon: Wellington Hampshire, MD;  Location: [facility] ORS;  Service: Cardiovascular;  Laterality: N/A;   . CARDIOVERSION N/A 12/16/2017   Procedure: CARDIOVERSION;  Surgeon: [name], MD;  Location: [facility] ORS;  Service: Cardiovascular;  Laterality: N/A;  . CAROTID ENDARTERECTOMY Left 10/23/2004  . FRACTURE SURGERY    . OOPHORECTOMY    . SHOULDER SURGERY  6/07   left  . TONSILLECTOMY AND ADENOIDECTOMY    . TOTAL HIP ARTHROPLASTY  2004   right    There were no vitals filed for this visit.  Subjective Assessment - 06/20/18 1354    Subjective  Patient states that she had a hard time over the holiday weekend because it was the first Thanksgiving without her husband. Patient reports no significant changes since her last visit. Patient arrived 6 minutes late to her session.    Pertinent History  Patient previously seen for balance difficulties and shoulder pain and the right.  Patient reports no falls in the past six months. Patient reports pain has not improved for the past month and has been about the same since the onset of pain.     Limitations  Walking    Patient Stated Goals  To improve balance when walking     Currently in Pain?  No/denies    Pain Onset  More than a month  ago      TREATMENT Therapeutic Exercise: LTRS in hooklying - x 2 minutes Hooklying bridges - x30 Hip abduction against hip machine - 2 x 10 40#  Neuromuscular Re-education: Reach behind weighted ball (1.2 kg) pass in standing on airex pad - L x10, Rx10, continuous pass around x10 each direction, CGA Weighted ball (1.2 kg) overhead reach in standing on airex pad w/ head follow- x10, CGA, 1 LOB recovered with ankle and hip strategy. VCs for slow and accurate movement. Weighted ball (1.2 kg) UE forward reach with rotation and head follow in standing on airex pad w/ head follow- x5 each side, CGA Toe taps onto 6" step on double pad -  x 10 without UE support, CGA Sit to stands with faded UE support - 3 x 10; VCs for weight shift and hip hinge mechanics. x10 BUE support, x5 RUE, x5 LUE, x10 w/ ball to block valgus  moment at knees.   PT Education - 06/20/18 1547    Education provided  Yes    Education Details  exercise technique, body mechanics, hip hinge and STS form    Person(s) Educated  Patient    Methods  Explanation;Demonstration;Tactile cues;Verbal cues    Comprehension  Verbalized understanding;Returned demonstration;Need further instruction          PT Long Term Goals - 05/18/18 1026      PT LONG TERM GOAL #1   Title  Patient will be independent with HEP to continue benefits of therapy after discharge.     Baseline  Dependent with form and technique for balancing exercises; moderate cueing for balance and strengthening; 02/14/2018: Independent with HEP     Time  4    Period  Weeks    Status  Achieved      PT LONG TERM GOAL #2   Title  Patient will improve FGA to over 26/30 to indicate functional improvement in strength and balance limitations and decrease fall risk    Baseline  22/30; ; 09/01/2017: 24/30; 09/30/2017: 25/30; 11/03/2017: 25/30; 02/14/2018: deferred to next visit; 03/16/2018: 28/30    Time  8    Period  Weeks    Status  Achieved      PT LONG TERM GOAL #3   Title  Patient will improve 5xSTS to under 16 seconds without use of a pillow to indicate functional improvement of LE function and decreased fall risk    Baseline  19 sec with pillow under chair; 09/01/2017 12 seconds with use of a pillow; 09/30/2017: 10.9 sec with use of pillow; defered today secondary to increased soreness from walking the previous day    Time  8    Period  Weeks    Status  Partially Met      PT LONG TERM GOAL #4   Title  Patient will be able to balance for 10sec with SLS to improve static balance and ability to get dressed when in standing    Baseline  1 sec; 09/01/2017: 3 sec each leg; 09/30/17: 4.5; 11/03/2017: 5 sec; 02/14/2018: Deffered to next visit; 03/16/2018: 7sec; 04/18/2018:7 sec; 05/18/2018; 5sec    Time  8    Period  Weeks    Status  On-going      PT LONG TERM GOAL #5   Title  Patient  will have a worst lumbar pain of a 3/10 over the past week to indicate functional improvement with performing standing activities and prolonged sitting.    Baseline  Worst pain: 8/10; 02/14/2018: worst  pain: 7/10; 03/16/2018: 3/10    Time  6    Period  Weeks    Status  Achieved      PT LONG TERM GOAL #6   Title  Patient will be able to sit for >1 hour to improve abiltity to drive without increase in pain.    Baseline  Increased pain after driving for <83MHD; 01/08/2978: Patient reports increase in pain for 29mn; 03/16/2018: 1 hour    Time  6    Period  Weeks    Status  Achieved      PT LONG TERM GOAL #7   Title  Patient will improve 668mWT to over 140081fo improve ability to perform endurance based acitivities such as walking to the mailbox without increase in fatigue    Baseline  1120f30f0/30/2019: 1380ft36fTime  6    Period  Weeks    Status  On-going            Plan - 06/20/18 1601    Clinical Impression Statement  Patient demonstrates improving balance strategies when on a compliant surface. Patient able to recover balance when lifting a weighted object overhead on a compliant surface. Patient reported finding sit to stand with faded UE support to be particular useful and would like to continue practicing this. Patient will benefit from skilled therapeutic intervention to progress strengthening and balance in order to decrease risk of falls and improve overall QOL.    Rehab Potential  Good    Clinical Impairments Affecting Rehab Potential  Positive: motivation; Negative: , age    PT Frequency  2x / week    PT Duration  6 weeks    PT Treatment/Interventions  ADLs/Self Care Home Management;Aquatic Therapy;Biofeedback;DME Instruction;Gait training;Stair training;Functional mobility training;Therapeutic activities;Therapeutic exercise;Balance training;Neuromuscular re-education;Patient/family education;Passive range of motion;Energy conservation;Ultrasound;Moist Heat;Iontophoresis  '4mg'$ /ml Dexamethasone;Cryotherapy;Electrical Stimulation;Manual techniques;Dry needling    PT Next Visit Plan  progress core and back strengthening    PT Home Exercise Plan  see education section    Consulted and Agree with Plan of Care  Patient       Patient will benefit from skilled therapeutic intervention in order to improve the following deficits and impairments:  Pain, Increased fascial restricitons, Decreased coordination, Decreased mobility, Decreased activity tolerance, Decreased endurance, Decreased range of motion, Decreased strength, Hypomobility, Postural dysfunction, Abnormal gait, Decreased balance, Difficulty walking  Visit Diagnosis: History of falling  Muscle weakness (generalized)  Unsteadiness on feet  Acute bilateral low back pain without sciatica     Problem List Patient Active Problem List   Diagnosis Date Noted  . Chronic bilateral low back pain without sciatica 12/31/2017  . Hypomagnesemia 09/29/2017  . Hypokalemia 09/29/2017  . Atrial fibrillation (HCC) [place]22/2019  . Trapezius strain, right, initial encounter 09/10/2017  . Depression, major, single episode, mild (HCC) [place]22/2019  . Chronic venous insufficiency 09/09/2017  . Lymphedema 09/09/2017  . Pain of toe of left foot 08/17/2017  . Grief 08/17/2017  . Epistaxis 07/31/2017  . Leg swelling 07/31/2017  . Dry eyes 07/06/2017  . Pain and swelling of lower extremity, right 03/29/2017  . Right shoulder pain 03/29/2017  . Diarrhea 05/18/2016  . Fall 05/01/2016  . Unintentional weight loss 12/30/2015  . Leg weakness, bilateral 12/30/2015  . Hyperkalemia 01/01/2015  . Occasional numbness/prickling/tingling of fingers and toes 12/18/2014  . Routine general medical examination at a health care facility 12/13/2013  . Macrocytic anemia 05/19/2013  . Psoriatic arthritis (HCC) [place]18/2014  . Psoriasis   .  Osteoarthritis, multiple sites   . Episodic mood disorder ([place]) 12/01/2011  . History of breast  cancer 10/28/2011  . GERD (gastroesophageal reflux disease)   . Hyperlipidemia   . Hypertension   . Osteopenia   . Carotid stenosis 05/27/2011   Myles Gip PT, DPT [phone] 06/20/2018, 4:10 PM  [facility] [address] [place], [address] Phone: [phone]   Fax:  [phone]  Name: [name] MRN: [id number] Date of Birth: 10/27/1940

## 2018-06-22 ENCOUNTER — Ambulatory Visit: Payer: Medicare Other

## 2018-06-22 DIAGNOSIS — R2681 Unsteadiness on feet: Secondary | ICD-10-CM | POA: Diagnosis not present

## 2018-06-22 DIAGNOSIS — M6281 Muscle weakness (generalized): Secondary | ICD-10-CM | POA: Diagnosis not present

## 2018-06-22 DIAGNOSIS — M545 Low back pain: Secondary | ICD-10-CM | POA: Diagnosis not present

## 2018-06-22 DIAGNOSIS — Z9181 History of falling: Secondary | ICD-10-CM

## 2018-06-22 NOTE — Therapy (Signed)
[facility] [address], [place], [address] Phone: [phone]   Fax:  [phone]  Physical Therapy Treatment  Patient Details  Name: [name] MRN: [id number] Date of Birth: [birth year] Referring Provider (PT): Caryl Bis MD   Encounter Date: 06/22/2018  PT End of Session - 06/22/18 1016    Visit Number  44    Number of Visits  47    Date for PT Re-Evaluation  06/30/18    Authorization Type  8/10    PT Start Time  0945    PT Stop Time  1030    PT Time Calculation (min)  45 min    Equipment Utilized During Treatment  Gait belt    Activity Tolerance  Patient tolerated treatment well    Behavior During Therapy  [facility] for tasks assessed/performed       Past Medical History:  Diagnosis Date  . (HFpEF) heart failure with preserved ejection fraction ([place])    a. 08/2017 Echo: EF 55-60%, no rwma, mild MR, mildly dil LA, nl RV fxn.  . Breast cancer ([place]) 2001   left breast  . Cancer ([place]) 2001   left breast ca  . Carotid arterial disease ([place])    a. 10/2004 s/p L CEA; b. 12/2015 Carotid U/S: RICA 1-39%; b. LICA patent CEA site.  Marland Kitchen GERD (gastroesophageal reflux disease)   . Hyperlipidemia   . Hypertension   . Osteoarthritis, multiple sites   . Osteopenia   . Persistent atrial fibrillation    a. Dx 08/2017; b. CHA2DS2VASc = 6-->Xarelto; c. 09/2017 Successful DCCV (second shock - 200J);   Marland Kitchen Personal history of radiation therapy 2001   left breast ca  . Psoriasis     Past Surgical History:  Procedure Laterality Date  . ABDOMINAL HYSTERECTOMY    . ANKLE FRACTURE SURGERY  4/08   left---hardware still in place  . 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 BREAST EXCISIONAL BIOPSY Left yrs ago   benign  . BREAST LUMPECTOMY Left 2001   f/u radiation  . CARDIOVERSION N/A 10/04/2017   Procedure: CARDIOVERSION;  Surgeon: Wellington Hampshire, MD;  Location: [facility] ORS;  Service: Cardiovascular;  Laterality: N/A;   . CARDIOVERSION N/A 12/16/2017   Procedure: CARDIOVERSION;  Surgeon: [name], MD;  Location: [facility] ORS;  Service: Cardiovascular;  Laterality: N/A;  . CAROTID ENDARTERECTOMY Left 10/23/2004  . FRACTURE SURGERY    . OOPHORECTOMY    . SHOULDER SURGERY  6/07   left  . TONSILLECTOMY AND ADENOIDECTOMY    . TOTAL HIP ARTHROPLASTY  2004   right    There were no vitals filed for this visit.  Subjective Assessment - 06/22/18 0957    Subjective  Patient reports she was sore since the previous session but reports no increase in concordant pain.     Pertinent History  Patient previously seen for balance difficulties and shoulder pain and the right.  Patient reports no falls in the past six months. Patient reports pain has not improved for the past month and has been about the same since the onset of pain.     Limitations  Walking    Patient Stated Goals  To improve balance when walking     Currently in Pain?  No/denies    Pain Onset  More than a month ago         TREATMENT Therapeutic Exercise: LTRS in hooklying - x 2 minutes Hooklying bridges -  x30 Reach behind cone grabs in standing on airex pad - 2 x 10  Step ups onto 6" step on double pad -  2 x 10 with UE support Squats with cone pickups on airex pad - 2 x 8  Standing hip extension against BTB - 2 x 10  Side stepping across airex beam - x 8 without UE support TRX sit to stands with UE support - 2 x 10    Patient demonstrates increased fatigue at the end of the session   PT Education - 06/22/18 1015    Education provided  Yes    Education Details  form/technique with exercise    Person(s) Educated  Patient    Methods  Explanation;Demonstration    Comprehension  Verbalized understanding;Returned demonstration          PT Long Term Goals - 05/18/18 1026      PT LONG TERM GOAL #1   Title  Patient will be independent with HEP to continue benefits of therapy after discharge.     Baseline  Dependent with form and  technique for balancing exercises; moderate cueing for balance and strengthening; 02/14/2018: Independent with HEP     Time  4    Period  Weeks    Status  Achieved      PT LONG TERM GOAL #2   Title  Patient will improve FGA to over 26/30 to indicate functional improvement in strength and balance limitations and decrease fall risk    Baseline  22/30; ; 09/01/2017: 24/30; 09/30/2017: 25/30; 11/03/2017: 25/30; 02/14/2018: deferred to next visit; 03/16/2018: 28/30    Time  8    Period  Weeks    Status  Achieved      PT LONG TERM GOAL #3   Title  Patient will improve 5xSTS to under 16 seconds without use of a pillow to indicate functional improvement of LE function and decreased fall risk    Baseline  19 sec with pillow under chair; 09/01/2017 12 seconds with use of a pillow; 09/30/2017: 10.9 sec with use of pillow; defered today secondary to increased soreness from walking the previous day    Time  8    Period  Weeks    Status  Partially Met      PT LONG TERM GOAL #4   Title  Patient will be able to balance for 10sec with SLS to improve static balance and ability to get dressed when in standing    Baseline  1 sec; 09/01/2017: 3 sec each leg; 09/30/17: 4.5; 11/03/2017: 5 sec; 02/14/2018: Deffered to next visit; 03/16/2018: 7sec; 04/18/2018:7 sec; 05/18/2018; 5sec    Time  8    Period  Weeks    Status  On-going      PT LONG TERM GOAL #5   Title  Patient will have a worst lumbar pain of a 3/10 over the past week to indicate functional improvement with performing standing activities and prolonged sitting.    Baseline  Worst pain: 8/10; 02/14/2018: worst pain: 7/10; 03/16/2018: 3/10    Time  6    Period  Weeks    Status  Achieved      PT LONG TERM GOAL #6   Title  Patient will be able to sit for >1 hour to improve abiltity to drive without increase in pain.    Baseline  Increased pain after driving for <42AJG; 02/27/5725: Patient reports increase in pain for 89mn; 03/16/2018: 1 hour    Time  6  Period   Weeks    Status  Achieved      PT LONG TERM GOAL #7   Title  Patient will improve 26mnWT to over 14016fto improve ability to perform endurance based acitivities such as walking to the mailbox without increase in fatigue    Baseline  112054f10/30/2019: 1380f63f Time  6    Period  Weeks    Status  On-going            Plan - 06/22/18 1020    Clinical Impression Statement  Patient demonstrates ability to perform grater amount of step ups at a greater height indicating functional carryover between treatment session. Patient continues to have difficulty with dynamic balance and will benefit from further skilled therapy to return to prior level of function.     Rehab Potential  Good    Clinical Impairments Affecting Rehab Potential  Positive: motivation; Negative: , age    PT Frequency  2x / week    PT Duration  6 weeks    PT Treatment/Interventions  ADLs/Self Care Home Management;Aquatic Therapy;Biofeedback;DME Instruction;Gait training;Stair training;Functional mobility training;Therapeutic activities;Therapeutic exercise;Balance training;Neuromuscular re-education;Patient/family education;Passive range of motion;Energy conservation;Ultrasound;Moist Heat;Iontophoresis 4mg/56mDexamethasone;Cryotherapy;Electrical Stimulation;Manual techniques;Dry needling    PT Next Visit Plan  progress core and back strengthening    PT Home Exercise Plan  see education section    Consulted and Agree with Plan of Care  Patient       Patient will benefit from skilled therapeutic intervention in order to improve the following deficits and impairments:  Pain, Increased fascial restricitons, Decreased coordination, Decreased mobility, Decreased activity tolerance, Decreased endurance, Decreased range of motion, Decreased strength, Hypomobility, Postural dysfunction, Abnormal gait, Decreased balance, Difficulty walking  Visit Diagnosis: History of falling  Muscle weakness (generalized)     Problem  List Patient Active Problem List   Diagnosis Date Noted  . Chronic bilateral low back pain without sciatica 12/31/2017  . Hypomagnesemia 09/29/2017  . Hypokalemia 09/29/2017  . Atrial fibrillation (HCC) [place]22/2019  . Trapezius strain, right, initial encounter 09/10/2017  . Depression, major, single episode, mild (HCC) [place]22/2019  . Chronic venous insufficiency 09/09/2017  . Lymphedema 09/09/2017  . Pain of toe of left foot 08/17/2017  . Grief 08/17/2017  . Epistaxis 07/31/2017  . Leg swelling 07/31/2017  . Dry eyes 07/06/2017  . Pain and swelling of lower extremity, right 03/29/2017  . Right shoulder pain 03/29/2017  . Diarrhea 05/18/2016  . Fall 05/01/2016  . Unintentional weight loss 12/30/2015  . Leg weakness, bilateral 12/30/2015  . Hyperkalemia 01/01/2015  . Occasional numbness/prickling/tingling of fingers and toes 12/18/2014  . Routine general medical examination at a health care facility 12/13/2013  . Macrocytic anemia 05/19/2013  . Psoriatic arthritis (HCC) [place]18/2014  . Psoriasis   . Osteoarthritis, multiple sites   . Episodic mood disorder (HCC) [place]14/2013  . History of breast cancer 10/28/2011  . GERD (gastroesophageal reflux disease)   . Hyperlipidemia   . Hypertension   . Osteopenia   . Carotid stenosis 05/27/2011    WesleBlythe StanfordDPT 06/22/2018, 10:26 AM  [facility] [address] [id number]: [phone]:  [phone]: ChrisMANVIR THORSON [phone] of Birth: 09/09/1940/11/26

## 2018-06-25 IMAGING — DX DG TIBIA/FIBULA 2V*R*
2 series · 2 of 2 positions shown · non-contrast
Comparison: None.

CLINICAL DATA: Soft tissue swelling

EXAM:
RIGHT TIBIA AND FIBULA - 2 VIEW

[lower leg ap]
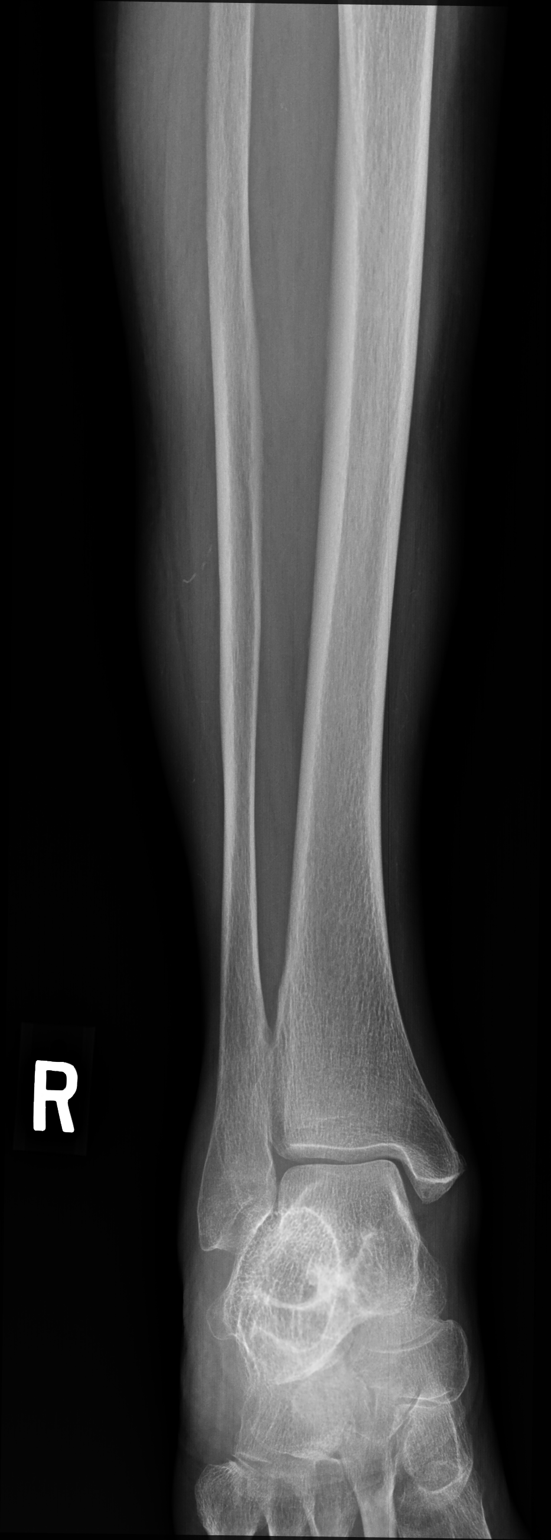

[lower leg lat]
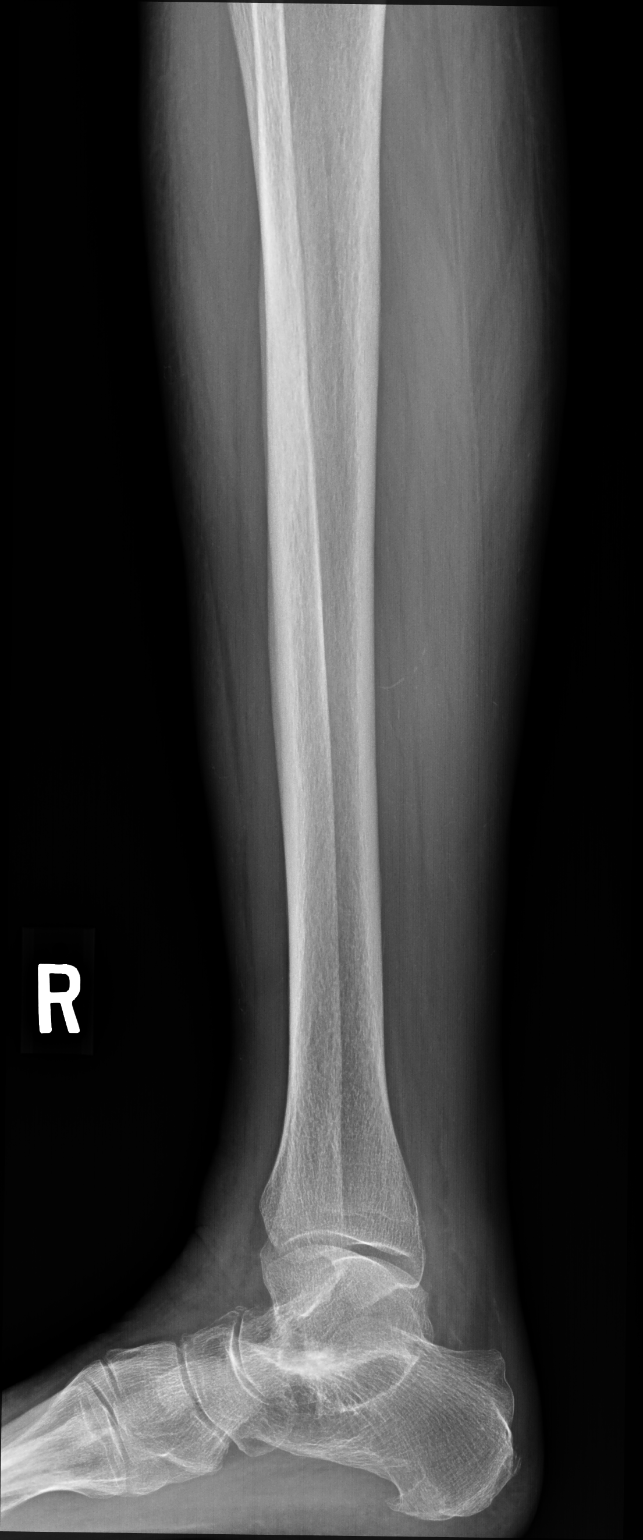

[2 of 2 positions shown; findings below may reference images not displayed]

FINDINGS: Frontal and lateral views obtained. Note that the proximal most
aspects of the tibia and fibula are not included on this study.

In the visualized regions, there is no fracture or dislocation. No
abnormal periosteal reaction. Ankle joint region appears
unremarkable. There is a small inferior calcaneal spur. No soft
tissue mass evident by radiography.
IMPRESSION: Note that portions of the proximal tibia and fibula not imaged on
this study. Visualized bony structures appear intact. No fracture or
dislocation. No abnormal periosteal reaction. No soft tissue mass
evident by radiography. There is a small inferior calcaneal spur.

## 2018-06-27 ENCOUNTER — Ambulatory Visit: Payer: Medicare Other

## 2018-06-27 DIAGNOSIS — Z9181 History of falling: Secondary | ICD-10-CM

## 2018-06-27 DIAGNOSIS — M6281 Muscle weakness (generalized): Secondary | ICD-10-CM

## 2018-06-27 DIAGNOSIS — R2681 Unsteadiness on feet: Secondary | ICD-10-CM

## 2018-06-27 DIAGNOSIS — M545 Low back pain: Secondary | ICD-10-CM | POA: Diagnosis not present

## 2018-06-27 NOTE — Therapy (Signed)
[facility] [address] [place], [place], [address] Phone: [phone]   Fax:  [phone]  Physical Therapy Treatment  Patient Details  Name: [name] MRN: [id number] Date of Birth: 01/04/1941 Referring Provider (PT): Sonnenberg MD   Encounter Date: 06/27/2018  PT End of Session - 06/27/18 1307    Visit Number  45    Number of Visits  47    Date for PT Re-Evaluation  06/30/18    Authorization Type  9/10    PT Start Time  1303    PT Stop Time  1345    PT Time Calculation (min)  42 min    Equipment Utilized During Treatment  Gait belt    Activity Tolerance  Patient tolerated treatment well    Behavior During Therapy  WFL for tasks assessed/performed       Past Medical History:  Diagnosis Date  . (HFpEF) heart failure with preserved ejection fraction (HCC)    a. 08/2017 Echo: EF 55-60%, no rwma, mild MR, mildly dil LA, nl RV fxn.  . Breast cancer (HCC) 2001   left breast  . Cancer (HCC) 2001   left breast ca  . Carotid arterial disease (HCC)    a. 10/2004 s/p L CEA; b. 12/2015 Carotid U/S: RICA 1-39%; b. LICA patent CEA site.  . GERD (gastroesophageal reflux disease)   . Hyperlipidemia   . Hypertension   . Osteoarthritis, multiple sites   . Osteopenia   . Persistent atrial fibrillation    a. Dx 08/2017; b. CHA2DS2VASc = 6-->Xarelto; c. 09/2017 Successful DCCV (second shock - 200J);   . Personal history of radiation therapy 2001   left breast ca  . Psoriasis     Past Surgical History:  Procedure Laterality Date  . ABDOMINAL HYSTERECTOMY    . ANKLE FRACTURE SURGERY  4/08   left---hardware still in place  . 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 BREAST EXCISIONAL BIOPSY Left yrs ago   benign  . BREAST LUMPECTOMY Left 2001   f/u radiation  . CARDIOVERSION N/A 10/04/2017   Procedure: CARDIOVERSION;  Surgeon: Arida, Muhammad A, MD;  Location: [facility] ORS;  Service: Cardiovascular;  Laterality: N/A;   . CARDIOVERSION N/A 12/16/2017   Procedure: CARDIOVERSION;  Surgeon: Klein, Steven C, MD;  Location: [facility] ORS;  Service: Cardiovascular;  Laterality: N/A;  . CAROTID ENDARTERECTOMY Left 10/23/2004  . FRACTURE SURGERY    . OOPHORECTOMY    . SHOULDER SURGERY  6/07   left  . TONSILLECTOMY AND ADENOIDECTOMY    . TOTAL HIP ARTHROPLASTY  2004   right    There were no vitals filed for this visit.  Subjective Assessment - 06/27/18 1305    Subjective  Patient report she is feeling slow today and states no increase in pain since the previous session.     Pertinent History  Patient previously seen for balance difficulties and shoulder pain and the right.  Patient reports no falls in the past six months. Patient reports pain has not improved for the past month and has been about the same since the onset of pain.     Limitations  Walking    Patient Stated Goals  To improve balance when walking     Currently in Pain?  No/denies    Pain Onset  More than a month ago       TREATMENT Therapeutic Exercise: LTRS in hooklying - x 2 minutes Hooklying bridges -   x30 Reach behind cone grabs in standing on airex pad - 2 x 10  Squats with cone pickups on airex pad - 2 x 12 Standing on large dynadisc - 5 x 30sec Sit to stands with unilateral UE support - 2 x 15 Monster walks in standing - 4 x 30ft  Tandem standing on airex beam - 10 x 6ft Side stepping across airex beam - 10 x 6ft TRX sit to stands with UE support - 2 x 10  Side stepping across airex beam - x 8 without UE support   Patient demonstrates increased fatigue at the end of the session   PT Education - 06/27/18 1307    Education provided  Yes    Education Details  form/technique with exercise    Person(s) Educated  Patient    Methods  Explanation;Demonstration    Comprehension  Verbalized understanding;Returned demonstration          PT Long Term Goals - 05/18/18 1026      PT LONG TERM GOAL #1   Title  Patient will be independent  with HEP to continue benefits of therapy after discharge.     Baseline  Dependent with form and technique for balancing exercises; moderate cueing for balance and strengthening; 02/14/2018: Independent with HEP     Time  4    Period  Weeks    Status  Achieved      PT LONG TERM GOAL #2   Title  Patient will improve FGA to over 26/30 to indicate functional improvement in strength and balance limitations and decrease fall risk    Baseline  22/30; ; 09/01/2017: 24/30; 09/30/2017: 25/30; 11/03/2017: 25/30; 02/14/2018: deferred to next visit; 03/16/2018: 28/30    Time  8    Period  Weeks    Status  Achieved      PT LONG TERM GOAL #3   Title  Patient will improve 5xSTS to under 16 seconds without use of a pillow to indicate functional improvement of LE function and decreased fall risk    Baseline  19 sec with pillow under chair; 09/01/2017 12 seconds with use of a pillow; 09/30/2017: 10.9 sec with use of pillow; defered today secondary to increased soreness from walking the previous day    Time  8    Period  Weeks    Status  Partially Met      PT LONG TERM GOAL #4   Title  Patient will be able to balance for 10sec with SLS to improve static balance and ability to get dressed when in standing    Baseline  1 sec; 09/01/2017: 3 sec each leg; 09/30/17: 4.5; 11/03/2017: 5 sec; 02/14/2018: Deffered to next visit; 03/16/2018: 7sec; 04/18/2018:7 sec; 05/18/2018; 5sec    Time  8    Period  Weeks    Status  On-going      PT LONG TERM GOAL #5   Title  Patient will have a worst lumbar pain of a 3/10 over the past week to indicate functional improvement with performing standing activities and prolonged sitting.    Baseline  Worst pain: 8/10; 02/14/2018: worst pain: 7/10; 03/16/2018: 3/10    Time  6    Period  Weeks    Status  Achieved      PT LONG TERM GOAL #6   Title  Patient will be able to sit for >1 hour to improve abiltity to drive without increase in pain.    Baseline  Increased pain after driving for <30min;    02/14/2018: Patient reports increase in pain for 30min; 03/16/2018: 1 hour    Time  6    Period  Weeks    Status  Achieved      PT LONG TERM GOAL #7   Title  Patient will improve 6minWT to over 1400ft to improve ability to perform endurance based acitivities such as walking to the mailbox without increase in fatigue    Baseline  1120ft; 05/18/2018: 1380ft    Time  6    Period  Weeks    Status  On-going            Plan - 06/27/18 1339    Clinical Impression Statement  Patient demonstrates decreased strength with sit to stands requiring UE support to perform. Patient demonstrates improvement overall with dynamic balance with less UE support to perform. Patient will benefit from further skilled therapy to return to prior level of function.     Rehab Potential  Good    Clinical Impairments Affecting Rehab Potential  Positive: motivation; Negative: , age    PT Frequency  2x / week    PT Duration  6 weeks    PT Treatment/Interventions  ADLs/Self Care Home Management;Aquatic Therapy;Biofeedback;DME Instruction;Gait training;Stair training;Functional mobility training;Therapeutic activities;Therapeutic exercise;Balance training;Neuromuscular re-education;Patient/family education;Passive range of motion;Energy conservation;Ultrasound;Moist Heat;Iontophoresis 4mg/ml Dexamethasone;Cryotherapy;Electrical Stimulation;Manual techniques;Dry needling    PT Next Visit Plan  progress core and back strengthening    PT Home Exercise Plan  see education section    Consulted and Agree with Plan of Care  Patient       Patient will benefit from skilled therapeutic intervention in order to improve the following deficits and impairments:  Pain, Increased fascial restricitons, Decreased coordination, Decreased mobility, Decreased activity tolerance, Decreased endurance, Decreased range of motion, Decreased strength, Hypomobility, Postural dysfunction, Abnormal gait, Decreased balance, Difficulty walking  Visit  Diagnosis: History of falling  Muscle weakness (generalized)  Unsteadiness on feet     Problem List Patient Active Problem List   Diagnosis Date Noted  . Chronic bilateral low back pain without sciatica 12/31/2017  . Hypomagnesemia 09/29/2017  . Hypokalemia 09/29/2017  . Atrial fibrillation (HCC) 09/10/2017  . Trapezius strain, right, initial encounter 09/10/2017  . Depression, major, single episode, mild (HCC) 09/10/2017  . Chronic venous insufficiency 09/09/2017  . Lymphedema 09/09/2017  . Pain of toe of left foot 08/17/2017  . Grief 08/17/2017  . Epistaxis 07/31/2017  . Leg swelling 07/31/2017  . Dry eyes 07/06/2017  . Pain and swelling of lower extremity, right 03/29/2017  . Right shoulder pain 03/29/2017  . Diarrhea 05/18/2016  . Fall 05/01/2016  . Unintentional weight loss 12/30/2015  . Leg weakness, bilateral 12/30/2015  . Hyperkalemia 01/01/2015  . Occasional numbness/prickling/tingling of fingers and toes 12/18/2014  . Routine general medical examination at a health care facility 12/13/2013  . Macrocytic anemia 05/19/2013  . Psoriatic arthritis (HCC) 02/03/2013  . Psoriasis   . Osteoarthritis, multiple sites   . Episodic mood disorder (HCC) 12/01/2011  . History of breast cancer 10/28/2011  . GERD (gastroesophageal reflux disease)   . Hyperlipidemia   . Hypertension   . Osteopenia   . Carotid stenosis 05/27/2011    [place] , PT DPT 06/27/2018, 1:45 PM  [facility] [address] [place], [place], [address] Phone: [phone]   Fax:  [phone]  Name: Corynn S Jetter MRN: [id number] Date of Birth: 02/10/1941   

## 2018-06-29 ENCOUNTER — Ambulatory Visit: Payer: Medicare Other

## 2018-06-29 DIAGNOSIS — Z9181 History of falling: Secondary | ICD-10-CM | POA: Diagnosis not present

## 2018-06-29 DIAGNOSIS — R2681 Unsteadiness on feet: Secondary | ICD-10-CM | POA: Diagnosis not present

## 2018-06-29 DIAGNOSIS — M6281 Muscle weakness (generalized): Secondary | ICD-10-CM | POA: Diagnosis not present

## 2018-06-29 DIAGNOSIS — M545 Low back pain: Secondary | ICD-10-CM | POA: Diagnosis not present

## 2018-06-29 NOTE — Therapy (Signed)
[facility] [address] [place], [address] Phone: [phone]   Fax:  [phone]  Physical Therapy Treatment  Patient Details  Name: [name] MRN: [id number] Date of Birth: [birth year] Referring Provider (PT): Caryl Bis MD   Encounter Date: 06/29/2018  PT End of Session - 06/29/18 1205    Visit Number  46    Number of Visits  47    Date for PT Re-Evaluation  08/10/18    Authorization Type  68/03 [employer]    PT Start Time  0945    PT Stop Time  1030    PT Time Calculation (min)  45 min    Equipment Utilized During Treatment  Gait belt    Activity Tolerance  Patient tolerated treatment well    Behavior During Therapy  [facility] for tasks assessed/performed       Past Medical History:  Diagnosis Date  . (HFpEF) heart failure with preserved ejection fraction ([place])    a. 08/2017 Echo: EF 55-60%, no rwma, mild MR, mildly dil LA, nl RV fxn.  . Breast cancer ([place]) 2001   left breast  . Cancer ([place]) 2001   left breast ca  . Carotid arterial disease ([place])    a. 10/2004 s/p L CEA; b. 12/2015 Carotid U/S: RICA 1-39%; b. LICA patent CEA site.  Marland Kitchen GERD (gastroesophageal reflux disease)   . Hyperlipidemia   . Hypertension   . Osteoarthritis, multiple sites   . Osteopenia   . Persistent atrial fibrillation    a. Dx 08/2017; b. CHA2DS2VASc = 6-->Xarelto; c. 09/2017 Successful DCCV (second shock - 200J);   Marland Kitchen Personal history of radiation therapy 2001   left breast ca  . Psoriasis     Past Surgical History:  Procedure Laterality Date  . ABDOMINAL HYSTERECTOMY    . ANKLE FRACTURE SURGERY  4/08   left---hardware still in place  . 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 BREAST EXCISIONAL BIOPSY Left yrs ago   benign  . BREAST LUMPECTOMY Left 2001   f/u radiation  . CARDIOVERSION N/A 10/04/2017   Procedure: CARDIOVERSION;  Surgeon: Wellington Hampshire, MD;  Location: [facility];  Service: Cardiovascular;   Laterality: N/A;  . CARDIOVERSION N/A 12/16/2017   Procedure: CARDIOVERSION;  Surgeon: [name], MD;  Location: [facility];  Service: Cardiovascular;  Laterality: N/A;  . CAROTID ENDARTERECTOMY Left 10/23/2004  . FRACTURE SURGERY    . OOPHORECTOMY    . SHOULDER SURGERY  6/07   left  . TONSILLECTOMY AND ADENOIDECTOMY    . TOTAL HIP ARTHROPLASTY  2004   right    There were no vitals filed for this visit.  Subjective Assessment - 06/29/18 1200    Subjective  Patient states she continues to improve overall. Patient reports improvement with balance and pain with her low back. Patient continues to have difficulty with performing exercises.      Pertinent History  Patient previously seen for balance difficulties and shoulder pain and the right.  Patient reports no falls in the past six months. Patient reports pain has not improved for the past month and has been about the same since the onset of pain.     Limitations  Walking    Patient Stated Goals  To improve balance when walking     Currently in Pain?  No/denies    Pain Onset  More than a month ago       TREATMENT  Therapeutic Exercise: LTRS in hooklying - x 2 minutes Hooklying bridges - x30 Squats with cone pickups on airex pad - 2 x 12 Sit to stands - x5 , x10 Tandem stance on airex pad - x 15 Ambulation around gym - [id number]  Single leg stance - x 20    Patient demonstrates increased fatigue at the end of the session   PT Education - 06/29/18 1205    Education provided  Yes    Education Details  form/technique with exercise    Person(s) Educated  Patient    Methods  Explanation;Demonstration    Comprehension  Verbalized understanding;Returned demonstration          PT Long Term Goals - 06/29/18 1004      PT LONG TERM GOAL #1   Title  Patient will be independent with HEP to continue benefits of therapy after discharge.     Baseline  Dependent with form and technique for balancing exercises; moderate cueing for  balance and strengthening; 02/14/2018: Independent with HEP     Time  4    Period  Weeks    Status  Achieved      PT LONG TERM GOAL #2   Title  Patient will improve FGA to over 26/30 to indicate functional improvement in strength and balance limitations and decrease fall risk    Baseline  22/30; ; 09/01/2017: 24/30; 09/30/2017: 25/30; 11/03/2017: 25/30; 02/14/2018: deferred to next visit; 03/16/2018: 28/30    Time  8    Period  Weeks    Status  Achieved      PT LONG TERM GOAL #3   Title  Patient will improve 5xSTS to under 16 seconds without use of a pillow to indicate functional improvement of LE function and decreased fall risk    Baseline  19 sec with pillow under chair; 09/01/2017 12 seconds with use of a pillow; 09/30/2017: 10.9 sec with use of pillow; defered today secondary to increased soreness from walking the previous day    Time  8    Period  Weeks    Status  Partially Met      PT LONG TERM GOAL #4   Title  Patient will be able to balance for 10sec with SLS to improve static balance and ability to get dressed when in standing    Baseline  1 sec; 09/01/2017: 3 sec each leg; 09/30/17: 4.5; 11/03/2017: 5 sec; 02/14/2018: Deffered to next visit; 03/16/2018: 7sec; 04/18/2018:7 sec; 05/18/2018; 5sec; 06/29/2018: 6sec     Time  8    Period  Weeks    Status  Partially Met      PT LONG TERM GOAL #5   Title  Patient will have a worst lumbar pain of a 3/10 over the past week to indicate functional improvement with performing standing activities and prolonged sitting.    Baseline  Worst pain: 8/10; 02/14/2018: worst pain: 7/10; 03/16/2018: 3/10    Time  6    Period  Weeks    Status  Achieved      Additional Long Term Goals   Additional Long Term Goals  Yes      PT LONG TERM GOAL #6   Title  Patient will be able to sit for >1 hour to improve abiltity to drive without increase in pain.    Baseline  Increased pain after driving for <<60FUX 73/23/5573 Patient reports increase in pain for 371m;  03/16/2018: 1 hour    Time  6    Period  Weeks    Status  Achieved      PT LONG TERM GOAL #7   Title  Patient will improve 14mnWT to over 14055fto improve ability to perform endurance based acitivities such as walking to the mailbox without increase in fatigue    Baseline  112038f10/30/2019: 1380f37f2/05/2018: 1340ft59fTime  6    Period  Weeks    Status  On-going      PT LONG TERM GOAL #8   Title  Patient will be able to perform sit to stands in less than 12 sec  without UE support to better be able to raise from a chair with less difficulty.    Baseline  13.5 sec     Time  6    Period  Weeks    Status  New            Plan - 06/29/18 1206    Clinical Impression Statement  Patient is making progress towards long term goals with abitlity to perform greater amount of SLS time compared to previous session. Patient educated on continuing exercises with exercise class and POC is to decrease therapy volume and greater independence. Patient demonstartes decreased speed with performing sit to stands. Patient will benefit from further skilled therapy to return to prior level of function.     Rehab Potential  Good    Clinical Impairments Affecting Rehab Potential  Positive: motivation; Negative: , age    PT Frequency  2x / week    PT Duration  6 weeks    PT Treatment/Interventions  ADLs/Self Care Home Management;Aquatic Therapy;Biofeedback;DME Instruction;Gait training;Stair training;Functional mobility training;Therapeutic activities;Therapeutic exercise;Balance training;Neuromuscular re-education;Patient/family education;Passive range of motion;Energy conservation;Ultrasound;Moist Heat;Iontophoresis 4mg/m46mexamethasone;Cryotherapy;Electrical Stimulation;Manual techniques;Dry needling    PT Next Visit Plan  progress core and back strengthening    PT Home Exercise Plan  see education section    Consulted and Agree with Plan of Care  Patient       Patient will benefit from skilled  therapeutic intervention in order to improve the following deficits and impairments:  Pain, Increased fascial restricitons, Decreased coordination, Decreased mobility, Decreased activity tolerance, Decreased endurance, Decreased range of motion, Decreased strength, Hypomobility, Postural dysfunction, Abnormal gait, Decreased balance, Difficulty walking  Visit Diagnosis: History of falling  Muscle weakness (generalized)     Problem List Patient Active Problem List   Diagnosis Date Noted  . Chronic bilateral low back pain without sciatica 12/31/2017  . Hypomagnesemia 09/29/2017  . Hypokalemia 09/29/2017  . Atrial fibrillation (HCC) 0[place]2/2019  . Trapezius strain, right, initial encounter 09/10/2017  . Depression, major, single episode, mild (HCC) 0[place]2/2019  . Chronic venous insufficiency 09/09/2017  . Lymphedema 09/09/2017  . Pain of toe of left foot 08/17/2017  . Grief 08/17/2017  . Epistaxis 07/31/2017  . Leg swelling 07/31/2017  . Dry eyes 07/06/2017  . Pain and swelling of lower extremity, right 03/29/2017  . Right shoulder pain 03/29/2017  . Diarrhea 05/18/2016  . Fall 05/01/2016  . Unintentional weight loss 12/30/2015  . Leg weakness, bilateral 12/30/2015  . Hyperkalemia 01/01/2015  . Occasional numbness/prickling/tingling of fingers and toes 12/18/2014  . Routine general medical examination at a health care facility 12/13/2013  . Macrocytic anemia 05/19/2013  . Psoriatic arthritis (HCC) 0[place]8/2014  . Psoriasis   . Osteoarthritis, multiple sites   . Episodic mood disorder (HCC) 0[place]4/2013  . History of breast cancer 10/28/2011  . GERD (gastroesophageal reflux disease)   . Hyperlipidemia   . Hypertension   .  Osteopenia   . Carotid stenosis 05/27/2011    Blythe Stanford, PT DPT 06/29/2018, 12:11 PM  [facility] [address] [place], [address] Phone: [phone]   Fax:   [phone]  Name: [name] MRN: [id number] Date of Birth: 01-28-1941

## 2018-07-04 ENCOUNTER — Ambulatory Visit: Payer: Medicare Other

## 2018-07-06 ENCOUNTER — Ambulatory Visit: Payer: Medicare Other

## 2018-07-06 DIAGNOSIS — M6281 Muscle weakness (generalized): Secondary | ICD-10-CM | POA: Diagnosis not present

## 2018-07-06 DIAGNOSIS — M545 Low back pain: Secondary | ICD-10-CM | POA: Diagnosis not present

## 2018-07-06 DIAGNOSIS — Z9181 History of falling: Secondary | ICD-10-CM | POA: Diagnosis not present

## 2018-07-06 DIAGNOSIS — R2681 Unsteadiness on feet: Secondary | ICD-10-CM | POA: Diagnosis not present

## 2018-07-06 NOTE — Therapy (Signed)
[facility] [address], [place], [address] Phone: [phone]   Fax:  [phone]  Physical Therapy Treatment  Patient Details  Name: [name] MRN: [id number] Date of Birth: 1940/08/23 Referring Provider (PT): Caryl Bis MD   Encounter Date: 07/06/2018  PT End of Session - 07/06/18 1043    Visit Number  47    Number of Visits  55    Date for PT Re-Evaluation  08/10/18    Authorization Type  1/10    PT Start Time  0945    PT Stop Time  1030    PT Time Calculation (min)  45 min    Equipment Utilized During Treatment  Gait belt    Activity Tolerance  Patient tolerated treatment well    Behavior During Therapy  [facility] for tasks assessed/performed       Past Medical History:  Diagnosis Date  . (HFpEF) heart failure with preserved ejection fraction ([place])    a. 08/2017 Echo: EF 55-60%, no rwma, mild MR, mildly dil LA, nl RV fxn.  . Breast cancer ([place]) 2001   left breast  . Cancer ([place]) 2001   left breast ca  . Carotid arterial disease ([place])    a. 10/2004 s/p L CEA; b. 12/2015 Carotid U/S: RICA 1-39%; b. LICA patent CEA site.  Marland Kitchen GERD (gastroesophageal reflux disease)   . Hyperlipidemia   . Hypertension   . Osteoarthritis, multiple sites   . Osteopenia   . Persistent atrial fibrillation    a. Dx 08/2017; b. CHA2DS2VASc = 6-->Xarelto; c. 09/2017 Successful DCCV (second shock - 200J);   Marland Kitchen Personal history of radiation therapy 2001   left breast ca  . Psoriasis     Past Surgical History:  Procedure Laterality Date  . ABDOMINAL HYSTERECTOMY    . ANKLE FRACTURE SURGERY  4/08   left---hardware still in place  . 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 BREAST EXCISIONAL BIOPSY Left yrs ago   benign  . BREAST LUMPECTOMY Left 2001   f/u radiation  . CARDIOVERSION N/A 10/04/2017   Procedure: CARDIOVERSION;  Surgeon: Wellington Hampshire, MD;  Location: [facility] ORS;  Service: Cardiovascular;  Laterality:  N/A;  . CARDIOVERSION N/A 12/16/2017   Procedure: CARDIOVERSION;  Surgeon: [name], MD;  Location: [facility] ORS;  Service: Cardiovascular;  Laterality: N/A;  . CAROTID ENDARTERECTOMY Left 10/23/2004  . FRACTURE SURGERY    . OOPHORECTOMY    . SHOULDER SURGERY  6/07   left  . TONSILLECTOMY AND ADENOIDECTOMY    . TOTAL HIP ARTHROPLASTY  2004   right    There were no vitals filed for this visit.  Subjective Assessment - 07/06/18 1039    Subjective  Patient states she has been performing greater amount of exercises and started going to silver sneakers at the gym.    Pertinent History  Patient previously seen for balance difficulties and shoulder pain and the right.  Patient reports no falls in the past six months. Patient reports pain has not improved for the past month and has been about the same since the onset of pain.     Limitations  Walking    Patient Stated Goals  To improve balance when walking     Currently in Pain?  No/denies    Pain Onset  More than a month ago       TREATMENT Therapeutic Exercise: LTRS in hooklying - x 2 minutes Hooklying bridges -  x30 Squats with cone pickups on airex pad - 2 x 12 Sit to stands - 2x10 with unilateral UE support Side stepping across airex beam - x 20  Ball toss in standing with airex pad - x 20     Patient demonstrates increased fatigue at the end of the session   PT Education - 07/06/18 1043    Education provided  Yes    Education Details  form/technique with exercise    Person(s) Educated  Patient    Methods  Explanation;Demonstration    Comprehension  Verbalized understanding;Returned demonstration          PT Long Term Goals - 06/29/18 1004      PT LONG TERM GOAL #1   Title  Patient will be independent with HEP to continue benefits of therapy after discharge.     Baseline  Dependent with form and technique for balancing exercises; moderate cueing for balance and strengthening; 02/14/2018: Independent with HEP     Time   4    Period  Weeks    Status  Achieved      PT LONG TERM GOAL #2   Title  Patient will improve FGA to over 26/30 to indicate functional improvement in strength and balance limitations and decrease fall risk    Baseline  22/30; ; 09/01/2017: 24/30; 09/30/2017: 25/30; 11/03/2017: 25/30; 02/14/2018: deferred to next visit; 03/16/2018: 28/30    Time  8    Period  Weeks    Status  Achieved      PT LONG TERM GOAL #3   Title  Patient will improve 5xSTS to under 16 seconds without use of a pillow to indicate functional improvement of LE function and decreased fall risk    Baseline  19 sec with pillow under chair; 09/01/2017 12 seconds with use of a pillow; 09/30/2017: 10.9 sec with use of pillow; defered today secondary to increased soreness from walking the previous day    Time  8    Period  Weeks    Status  Partially Met      PT LONG TERM GOAL #4   Title  Patient will be able to balance for 10sec with SLS to improve static balance and ability to get dressed when in standing    Baseline  1 sec; 09/01/2017: 3 sec each leg; 09/30/17: 4.5; 11/03/2017: 5 sec; 02/14/2018: Deffered to next visit; 03/16/2018: 7sec; 04/18/2018:7 sec; 05/18/2018; 5sec; 06/29/2018: 6sec     Time  8    Period  Weeks    Status  Partially Met      PT LONG TERM GOAL #5   Title  Patient will have a worst lumbar pain of a 3/10 over the past week to indicate functional improvement with performing standing activities and prolonged sitting.    Baseline  Worst pain: 8/10; 02/14/2018: worst pain: 7/10; 03/16/2018: 3/10    Time  6    Period  Weeks    Status  Achieved      Additional Long Term Goals   Additional Long Term Goals  Yes      PT LONG TERM GOAL #6   Title  Patient will be able to sit for >1 hour to improve abiltity to drive without increase in pain.    Baseline  Increased pain after driving for <25WLS; 9/37/3428: Patient reports increase in pain for 61mn; 03/16/2018: 1 hour    Time  6    Period  Weeks    Status  Achieved  PT LONG TERM GOAL #7   Title  Patient will improve 21mnWT to over 14038fto improve ability to perform endurance based acitivities such as walking to the mailbox without increase in fatigue    Baseline  112087f10/30/2019: 1380f16f2/05/2018: 1340ft59fTime  6    Period  Weeks    Status  On-going      PT LONG TERM GOAL #8   Title  Patient will be able to perform sit to stands in less than 12 sec  without UE support to better be able to raise from a chair with less difficulty.    Baseline  13.5 sec     Time  6    Period  Weeks    Status  New            Plan - 07/06/18 1044    Clinical Impression Statement  Patient demonstrates improvement with balance exercises with using less UE support compared to previous sessions. Although patient is improving, she continues to have decreased LE strength most notably with increased difficulty performing sit to stand. Patient will benefit from further skilled therapy to return to prior level of function.     Rehab Potential  Good    Clinical Impairments Affecting Rehab Potential  Positive: motivation; Negative: , age    PT Frequency  2x / week    PT Duration  6 weeks    PT Treatment/Interventions  ADLs/Self Care Home Management;Aquatic Therapy;Biofeedback;DME Instruction;Gait training;Stair training;Functional mobility training;Therapeutic activities;Therapeutic exercise;Balance training;Neuromuscular re-education;Patient/family education;Passive range of motion;Energy conservation;Ultrasound;Moist Heat;Iontophoresis 4mg/m41mexamethasone;Cryotherapy;Electrical Stimulation;Manual techniques;Dry needling    PT Next Visit Plan  progress core and back strengthening    PT Home Exercise Plan  see education section    Consulted and Agree with Plan of Care  Patient       Patient will benefit from skilled therapeutic intervention in order to improve the following deficits and impairments:  Pain, Increased fascial restricitons, Decreased coordination,  Decreased mobility, Decreased activity tolerance, Decreased endurance, Decreased range of motion, Decreased strength, Hypomobility, Postural dysfunction, Abnormal gait, Decreased balance, Difficulty walking  Visit Diagnosis: History of falling  Muscle weakness (generalized)     Problem List Patient Active Problem List   Diagnosis Date Noted  . Chronic bilateral low back pain without sciatica 12/31/2017  . Hypomagnesemia 09/29/2017  . Hypokalemia 09/29/2017  . Atrial fibrillation (HCC) 0[place]2/2019  . Trapezius strain, right, initial encounter 09/10/2017  . Depression, major, single episode, mild (HCC) 0[place]2/2019  . Chronic venous insufficiency 09/09/2017  . Lymphedema 09/09/2017  . Pain of toe of left foot 08/17/2017  . Grief 08/17/2017  . Epistaxis 07/31/2017  . Leg swelling 07/31/2017  . Dry eyes 07/06/2017  . Pain and swelling of lower extremity, right 03/29/2017  . Right shoulder pain 03/29/2017  . Diarrhea 05/18/2016  . Fall 05/01/2016  . Unintentional weight loss 12/30/2015  . Leg weakness, bilateral 12/30/2015  . Hyperkalemia 01/01/2015  . Occasional numbness/prickling/tingling of fingers and toes 12/18/2014  . Routine general medical examination at a health care facility 12/13/2013  . Macrocytic anemia 05/19/2013  . Psoriatic arthritis (HCC) 0[place]8/2014  . Psoriasis   . Osteoarthritis, multiple sites   . Episodic mood disorder (HCC) 0[place]4/2013  . History of breast cancer 10/28/2011  . GERD (gastroesophageal reflux disease)   . Hyperlipidemia   . Hypertension   . Osteopenia   . Carotid stenosis 05/27/2011    WesleyBlythe StanfordPT 07/06/2018, 10:49 AM  [facility] [address] [place], [address] Phone: [phone]   Fax:  [phone]  Name: [name] MRN: [id number] Date of Birth: 05/09/1941

## 2018-07-08 ENCOUNTER — Ambulatory Visit (INDEPENDENT_AMBULATORY_CARE_PROVIDER_SITE_OTHER): Payer: Medicare Other | Admitting: Family Medicine

## 2018-07-08 ENCOUNTER — Encounter: Payer: Self-pay | Admitting: Family Medicine

## 2018-07-08 ENCOUNTER — Ambulatory Visit (INDEPENDENT_AMBULATORY_CARE_PROVIDER_SITE_OTHER): Payer: Medicare Other

## 2018-07-08 VITALS — BP 120/78 | HR 52 | Temp 97.3°F | Ht 66.5 in | Wt 125.4 lb

## 2018-07-08 DIAGNOSIS — M25551 Pain in right hip: Secondary | ICD-10-CM | POA: Diagnosis not present

## 2018-07-08 DIAGNOSIS — I4891 Unspecified atrial fibrillation: Secondary | ICD-10-CM

## 2018-07-08 DIAGNOSIS — Z96641 Presence of right artificial hip joint: Secondary | ICD-10-CM | POA: Diagnosis not present

## 2018-07-08 DIAGNOSIS — S32010A Wedge compression fracture of first lumbar vertebra, initial encounter for closed fracture: Secondary | ICD-10-CM | POA: Diagnosis not present

## 2018-07-08 DIAGNOSIS — G8929 Other chronic pain: Secondary | ICD-10-CM | POA: Diagnosis not present

## 2018-07-08 DIAGNOSIS — F320 Major depressive disorder, single episode, mild: Secondary | ICD-10-CM | POA: Diagnosis not present

## 2018-07-08 DIAGNOSIS — I10 Essential (primary) hypertension: Secondary | ICD-10-CM | POA: Diagnosis not present

## 2018-07-08 DIAGNOSIS — M545 Low back pain, unspecified: Secondary | ICD-10-CM

## 2018-07-08 DIAGNOSIS — Z471 Aftercare following joint replacement surgery: Secondary | ICD-10-CM | POA: Diagnosis not present

## 2018-07-08 LAB — BASIC METABOLIC PANEL
BUN: 27 mg/dL — ABNORMAL HIGH (ref 6–23)
CO2: 30 mEq/L (ref 19–32)
Calcium: 10.1 mg/dL (ref 8.4–10.5)
Chloride: 99 mEq/L (ref 96–112)
Creatinine, Ser: 0.92 mg/dL (ref 0.40–1.20)
GFR: 62.54 mL/min (ref >60.00)
Glucose, Bld: 100 mg/dL — ABNORMAL HIGH (ref 70–99)
Potassium: 4.7 mEq/L (ref 3.5–5.1)
Sodium: 138 mEq/L (ref 135–145)

## 2018-07-08 NOTE — Assessment & Plan Note (Signed)
Chronic issue.  Slight improvement with physical therapy.  We will obtain an x-ray of her lumbar spine.  She will continue to stay active.

## 2018-07-08 NOTE — Assessment & Plan Note (Signed)
Adequately controlled.  Check BMP. 

## 2018-07-08 NOTE — Assessment & Plan Note (Signed)
Some depression though I suspect this is more related to grief and a normal reaction the loss of people close to her.  She will continue her current regimen and monitor.

## 2018-07-08 NOTE — Patient Instructions (Addendum)
Nice to see you. We will get x-rays today and contact you with the results.  We will get lab work today as well and contact you with the results.

## 2018-07-08 NOTE — Assessment & Plan Note (Signed)
X-ray today.  Consider having her see orthopedics in follow-up.

## 2018-07-08 NOTE — Progress Notes (Signed)
[name], MD Phone: [phone]  [name] is a 77 y.o. female who presents today for follow-up.  CC: Depression/anxiety, A. fib, chronic back pain, right hip pain  Depression/anxiety: Patient notes she does feel down and depressed to some degree.  She has lost a number of people close to her recently.  She does have lots of support.  She feels it would be abnormal if she did not feel down and depressed to some degree.  She continues on Remeron.  She does take the Ativan though not often.  It does help when she takes it.  No drowsiness.  No SI.  A. fib: Continues on Pradaxa, metoprolol, and flecainide.  No palpitations or bleeding.  She did have a stress test and reports she was not a candidate for a pacemaker.  Chronic back pain: Patient notes she has been dealing with chronic low back pain since she was a little girl.  She notes physical therapy has provided some improvement particularly with her symptoms in the morning.  It has also help with her balance.  She notes it will flare at times.  Some radiation down her legs.  She has not had any recent imaging.  Right hip pain: Patient notes recently she has noticed that her right hip will be uncomfortable when she is flexing it.  She does have a history of a hip replacement.  No injury.  Social History   Tobacco Use  Smoking Status Former Smoker  . Packs/day: 1.00  . Years: 40.00  . Pack years: 40.00  . Types: Cigarettes  . Last attempt to quit: 07/21/1995  . Years since quitting: 22.9  Smokeless Tobacco Never Used     ROS see history of present illness  Objective  Physical Exam Vitals:   07/08/18 0901  BP: 120/78  Pulse: (!) 52  Temp: (!) 97.3 F (36.3 C)  SpO2: 98%    BP Readings from Last 3 Encounters:  07/08/18 120/78  03/31/18 128/88  01/31/18 138/62   Wt Readings from Last 3 Encounters:  07/08/18 125 lb 6.4 oz (56.9 kg)  03/31/18 126 lb 12 oz (57.5 kg)  01/11/18 121 lb 8 oz (55.1 kg)     Physical Exam Constitutional:      General: She is not in acute distress.    Appearance: She is not diaphoretic.  Cardiovascular:     Rate and Rhythm: Normal rate and regular rhythm.     Heart sounds: Normal heart sounds.  Pulmonary:     Effort: Pulmonary effort is normal.     Breath sounds: Normal breath sounds.  Musculoskeletal:     Comments: No midline spine tenderness, no midline spine step-off, no muscular back tenderness, mild tenderness over the anterior right hip, no tenderness over the lateral hip on the right, good range of motion on internal and external range of motion bilateral hips  Skin:    General: Skin is warm and dry.  Neurological:     Mental Status: She is alert.     Comments: 5/5 strength bilateral quads, hip flexors, hamstrings, plantar flexion, and dorsiflexion, sensation light touch intact bilateral lower extremities      Assessment/Plan: Please see individual problem list.  Atrial fibrillation (HCC) Sinus rhythm.  She will continue to follow with cardiology.  Hypertension Adequately controlled.  Check BMP.  Chronic bilateral low back pain without sciatica Chronic issue.  Slight improvement with physical therapy.  We will obtain an x-ray of her lumbar spine.  She will continue  to stay active.  Depression, major, single episode, mild ([place]) Some depression though I suspect this is more related to grief and a normal reaction the loss of people close to her.  She will continue her current regimen and monitor.  Right hip pain X-ray today.  Consider having her see orthopedics in follow-up.    Orders Placed This Encounter  Procedures  . DG Lumbar Spine Complete    Standing Status:   Future    Number of Occurrences:   1    Standing Expiration Date:   09/09/2019    Order Specific Question:   Reason for Exam (SYMPTOM  OR DIAGNOSIS REQUIRED)    Answer:   chronic low back pain, some radiation down legs, pain has been occurring for decades, only mild  improvement with PT    Order Specific Question:   Preferred imaging location?    Answer:   [facility] Specific Question:   Radiology Contrast Protocol - do NOT remove file path    Answer:   \\[email or web address]s.pdf  . DG HIP UNILAT WITH PELVIS 2-3 VIEWS RIGHT    Standing Status:   Future    Number of Occurrences:   1    Standing Expiration Date:   09/09/2019    Order Specific Question:   Reason for Exam (SYMPTOM  OR DIAGNOSIS REQUIRED)    Answer:   right hip pain with flexion of hip, history of hip replacement    Order Specific Question:   Preferred imaging location?    Answer:   [facility] Specific Question:   Radiology Contrast Protocol - do NOT remove file path    Answer:   \\[email or web address]\Radiant\DXFluoroContrastProtocols.pdf  . Basic Metabolic Panel (BMET)    No orders of the defined types were placed in this encounter.    [name], MD [place]

## 2018-07-08 NOTE — Assessment & Plan Note (Signed)
Sinus rhythm.  She will continue to follow with cardiology.

## 2018-07-10 ENCOUNTER — Other Ambulatory Visit: Payer: Self-pay | Admitting: Family Medicine

## 2018-07-10 DIAGNOSIS — M25551 Pain in right hip: Secondary | ICD-10-CM

## 2018-07-11 ENCOUNTER — Telehealth: Payer: Self-pay | Admitting: *Deleted

## 2018-07-11 NOTE — Telephone Encounter (Signed)
Pt was calling to check in on the status of her MRI. I did advise pt that PCP is out of the office and will be back on Thursday and if he placed the orders for the MRI on Friday then hopefully she will get a call sometime this week to be set up for the MRI. Pt advised and voiced understanding.

## 2018-07-11 NOTE — Telephone Encounter (Signed)
Copied from [place] [phone]. Topic: General - Other >> Jul 11, 2018 12:59 PM Percell Belt A wrote: Reason for CRM:  Pt called in stated that someone called her and stated she had compression fracture and need a MRI.  She would like to talk to [facility] about this MRI   Best number   [phone]

## 2018-07-19 ENCOUNTER — Ambulatory Visit: Payer: Medicare Other

## 2018-07-19 DIAGNOSIS — M545 Low back pain, unspecified: Secondary | ICD-10-CM

## 2018-07-19 DIAGNOSIS — Z9181 History of falling: Secondary | ICD-10-CM | POA: Diagnosis not present

## 2018-07-19 DIAGNOSIS — R2681 Unsteadiness on feet: Secondary | ICD-10-CM

## 2018-07-19 DIAGNOSIS — M6281 Muscle weakness (generalized): Secondary | ICD-10-CM | POA: Diagnosis not present

## 2018-07-19 NOTE — Therapy (Signed)
[facility] [address], [place], [address] Phone: [phone]   Fax:  [phone]  Physical Therapy Treatment  Patient Details  Name: [name] MRN: [id number] Date of Birth: 09-28-1940 Referring Provider (PT): Caryl Bis MD   Encounter Date: 07/19/2018  PT End of Session - 07/19/18 1444    Visit Number  48    Number of Visits  55    Date for PT Re-Evaluation  08/10/18    Authorization Type  2/10    PT Start Time  5974    PT Stop Time  1430    PT Time Calculation (min)  45 min    Equipment Utilized During Treatment  Gait belt    Activity Tolerance  Patient tolerated treatment well    Behavior During Therapy  [facility] for tasks assessed/performed       Past Medical History:  Diagnosis Date  . (HFpEF) heart failure with preserved ejection fraction ([place])    a. 08/2017 Echo: EF 55-60%, no rwma, mild MR, mildly dil LA, nl RV fxn.  . Breast cancer ([place]) 2001   left breast  . Cancer ([place]) 2001   left breast ca  . Carotid arterial disease ([place])    a. 10/2004 s/p L CEA; b. 12/2015 Carotid U/S: RICA 1-39%; b. LICA patent CEA site.  Marland Kitchen GERD (gastroesophageal reflux disease)   . Hyperlipidemia   . Hypertension   . Osteoarthritis, multiple sites   . Osteopenia   . Persistent atrial fibrillation    a. Dx 08/2017; b. CHA2DS2VASc = 6-->Xarelto; c. 09/2017 Successful DCCV (second shock - 200J);   Marland Kitchen Personal history of radiation therapy 2001   left breast ca  . Psoriasis     Past Surgical History:  Procedure Laterality Date  . ABDOMINAL HYSTERECTOMY    . ANKLE FRACTURE SURGERY  4/08   left---hardware still in place  . 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 BREAST EXCISIONAL BIOPSY Left yrs ago   benign  . BREAST LUMPECTOMY Left 2001   f/u radiation  . CARDIOVERSION N/A 10/04/2017   Procedure: CARDIOVERSION;  Surgeon: Wellington Hampshire, MD;  Location: [facility] ORS;  Service: Cardiovascular;  Laterality:  N/A;  . CARDIOVERSION N/A 12/16/2017   Procedure: CARDIOVERSION;  Surgeon: [name], MD;  Location: [facility] ORS;  Service: Cardiovascular;  Laterality: N/A;  . CAROTID ENDARTERECTOMY Left 10/23/2004  . FRACTURE SURGERY    . OOPHORECTOMY    . SHOULDER SURGERY  6/07   left  . TONSILLECTOMY AND ADENOIDECTOMY    . TOTAL HIP ARTHROPLASTY  2004   right    There were no vitals filed for this visit.  Subjective Assessment - 07/19/18 1441    Subjective  Patient states she went to her doctor and reports he wants to do an MRI from her back after seeing a she had a compression fracture her in [place].    Pertinent History  Patient previously seen for balance difficulties and shoulder pain and the right.  Patient reports no falls in the past six months. Patient reports pain has not improved for the past month and has been about the same since the onset of pain.     Limitations  Walking    Patient Stated Goals  To improve balance when walking     Currently in Pain?  No/denies    Pain Onset  More than a month ago       TREATMENT  Therapeutic Exercise Step ups onto 6" step -- x 20  Bridges in supine -- x 20  Neuromuscular Education PNE Intro (General Pain Knowledge): Patient introduced to the topic of pain neuroscience education and improving knowledge of how pain works to promote improved recovery and rehabilitation. Current knowledge and understanding of patient on pain related topics was explored to create a baseline.    Sensitivity of Nerves: Patient educated on the concept of the nervous system as the body's alarm system and the role of nociception to warn the body of danger. Peripheral Nerve sensitization, hyperalgesia and allodynia were explained using metaphors to promote deep learning.   Pain comes from the brain: Patient was educated on the concept of pain as an output of the brain, including nociception versus pain, inhibition and facilitation, and threat level using metaphors to promote  deep learning   Patient demonstrates no increase in pain at the end of the session     PT Education - 07/19/18 1443    Education provided  Yes    Education Details  form/technique with exercise; educated patient on the pain system as an alarm system     Person(s) Educated  Patient    Methods  Explanation;Demonstration    Comprehension  Verbalized understanding;Returned demonstration          PT Long Term Goals - 06/29/18 1004      PT LONG TERM GOAL #1   Title  Patient will be independent with HEP to continue benefits of therapy after discharge.     Baseline  Dependent with form and technique for balancing exercises; moderate cueing for balance and strengthening; 02/14/2018: Independent with HEP     Time  4    Period  Weeks    Status  Achieved      PT LONG TERM GOAL #2   Title  Patient will improve FGA to over 26/30 to indicate functional improvement in strength and balance limitations and decrease fall risk    Baseline  22/30; ; 09/01/2017: 24/30; 09/30/2017: 25/30; 11/03/2017: 25/30; 02/14/2018: deferred to next visit; 03/16/2018: 28/30    Time  8    Period  Weeks    Status  Achieved      PT LONG TERM GOAL #3   Title  Patient will improve 5xSTS to under 16 seconds without use of a pillow to indicate functional improvement of LE function and decreased fall risk    Baseline  19 sec with pillow under chair; 09/01/2017 12 seconds with use of a pillow; 09/30/2017: 10.9 sec with use of pillow; defered today secondary to increased soreness from walking the previous day    Time  8    Period  Weeks    Status  Partially Met      PT LONG TERM GOAL #4   Title  Patient will be able to balance for 10sec with SLS to improve static balance and ability to get dressed when in standing    Baseline  1 sec; 09/01/2017: 3 sec each leg; 09/30/17: 4.5; 11/03/2017: 5 sec; 02/14/2018: Deffered to next visit; 03/16/2018: 7sec; 04/18/2018:7 sec; 05/18/2018; 5sec; 06/29/2018: 6sec     Time  8    Period  Weeks     Status  Partially Met      PT LONG TERM GOAL #5   Title  Patient will have a worst lumbar pain of a 3/10 over the past week to indicate functional improvement with performing standing activities and prolonged sitting.    Baseline  Worst pain: 8/10; 02/14/2018: worst pain: 7/10; 03/16/2018: 3/10    Time  6    Period  Weeks    Status  Achieved      Additional Long Term Goals   Additional Long Term Goals  Yes      PT LONG TERM GOAL #6   Title  Patient will be able to sit for >1 hour to improve abiltity to drive without increase in pain.    Baseline  Increased pain after driving for <21HYQ; 6/57/8469: Patient reports increase in pain for 19mn; 03/16/2018: 1 hour    Time  6    Period  Weeks    Status  Achieved      PT LONG TERM GOAL #7   Title  Patient will improve 652mWT to over 14008fo improve ability to perform endurance based acitivities such as walking to the mailbox without increase in fatigue    Baseline  1120f11f0/30/2019: 1380ft80f/05/2018: 1340ft 13fime  6    Period  Weeks    Status  On-going      PT LONG TERM GOAL #8   Title  Patient will be able to perform sit to stands in less than 12 sec  without UE support to better be able to raise from a chair with less difficulty.    Baseline  13.5 sec     Time  6    Period  Weeks    Status  New            Plan - 07/19/18 1500    Clinical Impression Statement  Patient demonstrates decent understanding of pain as an alarm system at the end of the session and is receptive to learning more about pain neuroscience. Patient educated of pain neuroscience as she demonstrates persistent pain and pain neuroscience education shows decrease in NPRS of on average 3 points after a curriculum is performed. Patient will benefit from further skilled therapy to return to prior level of function.     Rehab Potential  Good    Clinical Impairments Affecting Rehab Potential  Positive: motivation; Negative: , age    PT Frequency  2x / week     PT Duration  6 weeks    PT Treatment/Interventions  ADLs/Self Care Home Management;Aquatic Therapy;Biofeedback;DME Instruction;Gait training;Stair training;Functional mobility training;Therapeutic activities;Therapeutic exercise;Balance training;Neuromuscular re-education;Patient/family education;Passive range of motion;Energy conservation;Ultrasound;Moist Heat;Iontophoresis 4mg/ml41mxamethasone;Cryotherapy;Electrical Stimulation;Manual techniques;Dry needling    PT Next Visit Plan  progress core and back strengthening    PT Home Exercise Plan  see education section    Consulted and Agree with Plan of Care  Patient       Patient will benefit from skilled therapeutic intervention in order to improve the following deficits and impairments:  Pain, Increased fascial restricitons, Decreased coordination, Decreased mobility, Decreased activity tolerance, Decreased endurance, Decreased range of motion, Decreased strength, Hypomobility, Postural dysfunction, Abnormal gait, Decreased balance, Difficulty walking  Visit Diagnosis: History of falling  Unsteadiness on feet  Acute bilateral low back pain without sciatica     Problem List Patient Active Problem List   Diagnosis Date Noted  . Right hip pain 07/08/2018  . Chronic bilateral low back pain without sciatica 12/31/2017  . Hypomagnesemia 09/29/2017  . Hypokalemia 09/29/2017  . Atrial fibrillation (HCC) 02[place]/2019  . Trapezius strain, right, initial encounter 09/10/2017  . Depression, major, single episode, mild (HCC) 02[place]/2019  . Chronic venous insufficiency 09/09/2017  . Lymphedema 09/09/2017  . Pain of toe of left foot 08/17/2017  .  Grief 08/17/2017  . Epistaxis 07/31/2017  . Leg swelling 07/31/2017  . Dry eyes 07/06/2017  . Pain and swelling of lower extremity, right 03/29/2017  . Right shoulder pain 03/29/2017  . Diarrhea 05/18/2016  . Fall 05/01/2016  . Unintentional weight loss 12/30/2015  . Leg weakness, bilateral  12/30/2015  . Hyperkalemia 01/01/2015  . Occasional numbness/prickling/tingling of fingers and toes 12/18/2014  . Routine general medical examination at a health care facility 12/13/2013  . Macrocytic anemia 05/19/2013  . Psoriatic arthritis ([place]) 02/03/2013  . Psoriasis   . Osteoarthritis, multiple sites   . Episodic mood disorder ([place]) 12/01/2011  . History of breast cancer 10/28/2011  . GERD (gastroesophageal reflux disease)   . Hyperlipidemia   . Hypertension   . Osteopenia   . Carotid stenosis 05/27/2011    Blythe Stanford 07/19/2018, 3:06 PM  [facility] [address], [place], [address] Phone: [phone]   Fax:  [phone]  Name: [name] MRN: [id number] Date of Birth: 1941/01/15

## 2018-07-21 ENCOUNTER — Other Ambulatory Visit: Payer: Self-pay | Admitting: Family Medicine

## 2018-07-21 DIAGNOSIS — M5136 Other intervertebral disc degeneration, lumbar region: Secondary | ICD-10-CM

## 2018-07-26 ENCOUNTER — Ambulatory Visit: Payer: Medicare Other | Attending: Family Medicine

## 2018-07-26 DIAGNOSIS — Z9181 History of falling: Secondary | ICD-10-CM | POA: Diagnosis not present

## 2018-07-26 DIAGNOSIS — R2681 Unsteadiness on feet: Secondary | ICD-10-CM | POA: Insufficient documentation

## 2018-07-26 NOTE — Therapy (Signed)
[facility] [address], [place], [address] Phone: [phone]   Fax:  [phone]  Physical Therapy Treatment  Patient Details  Name: [name] MRN: [id number] Date of Birth: [birth year] Referring Provider (PT): Caryl Bis MD   Encounter Date: 07/26/2018  PT End of Session - 07/26/18 1508    Visit Number  49    Number of Visits  55    Date for PT Re-Evaluation  08/10/18    Authorization Type  3/10    PT Start Time  1430    PT Stop Time  1515    PT Time Calculation (min)  45 min    Equipment Utilized During Treatment  Gait belt    Activity Tolerance  Patient tolerated treatment well    Behavior During Therapy  [facility] for tasks assessed/performed       Past Medical History:  Diagnosis Date  . (HFpEF) heart failure with preserved ejection fraction ([place])    a. 08/2017 Echo: EF 55-60%, no rwma, mild MR, mildly dil LA, nl RV fxn.  . Breast cancer ([place]) 2001   left breast  . Cancer ([place]) 2001   left breast ca  . Carotid arterial disease ([place])    a. 10/2004 s/p L CEA; b. 12/2015 Carotid U/S: RICA 1-39%; b. LICA patent CEA site.  Marland Kitchen GERD (gastroesophageal reflux disease)   . Hyperlipidemia   . Hypertension   . Osteoarthritis, multiple sites   . Osteopenia   . Persistent atrial fibrillation    a. Dx 08/2017; b. CHA2DS2VASc = 6-->Xarelto; c. 09/2017 Successful DCCV (second shock - 200J);   Marland Kitchen Personal history of radiation therapy 2001   left breast ca  . Psoriasis     Past Surgical History:  Procedure Laterality Date  . ABDOMINAL HYSTERECTOMY    . ANKLE FRACTURE SURGERY  4/08   left---hardware still in place  . 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 BREAST EXCISIONAL BIOPSY Left yrs ago   benign  . BREAST LUMPECTOMY Left 2001   f/u radiation  . CARDIOVERSION N/A 10/04/2017   Procedure: CARDIOVERSION;  Surgeon: Wellington Hampshire, MD;  Location: [facility] ORS;  Service: Cardiovascular;  Laterality: N/A;   . CARDIOVERSION N/A 12/16/2017   Procedure: CARDIOVERSION;  Surgeon: [name], MD;  Location: [facility] ORS;  Service: Cardiovascular;  Laterality: N/A;  . CAROTID ENDARTERECTOMY Left 10/23/2004  . FRACTURE SURGERY    . OOPHORECTOMY    . SHOULDER SURGERY  6/07   left  . TONSILLECTOMY AND ADENOIDECTOMY    . TOTAL HIP ARTHROPLASTY  2004   right    There were no vitals filed for this visit.  Subjective Assessment - 07/26/18 1505    Subjective  Patient states she is ready for this to be her last visit until after she has the MRI. Patient reports she continues to have inconsistent low back pain.     Pertinent History  Patient previously seen for balance difficulties and shoulder pain and the right.  Patient reports no falls in the past six months. Patient reports pain has not improved for the past month and has been about the same since the onset of pain.     Limitations  Walking    Patient Stated Goals  To improve balance when walking     Currently in Pain?  No/denies    Pain Onset  More than a month ago       TREATMENT  Therapeutic Exercise:  Squats in standing - x 20  LTRs in hooklying - x 20  Hip abduction in standing - x 20 Hip extension in standing - x 20  Sit to stands with unilateral arm support - x 20 Bridges in hooklying - x 20 Single leg stance with UE support unilaterally - x 20  Lunges in standing - x 20  Patient demonstrates increased fatigue at the end of the session  PT Education - 07/26/18 1506    Education provided  Yes    Education Details  form/technique with exercise: Updated HEP    Person(s) Educated  Patient    Methods  Explanation;Demonstration    Comprehension  Verbalized understanding;Returned demonstration          PT Long Term Goals - 06/29/18 1004      PT LONG TERM GOAL #1   Title  Patient will be independent with HEP to continue benefits of therapy after discharge.     Baseline  Dependent with form and technique for balancing exercises;  moderate cueing for balance and strengthening; 02/14/2018: Independent with HEP     Time  4    Period  Weeks    Status  Achieved      PT LONG TERM GOAL #2   Title  Patient will improve FGA to over 26/30 to indicate functional improvement in strength and balance limitations and decrease fall risk    Baseline  22/30; ; 09/01/2017: 24/30; 09/30/2017: 25/30; 11/03/2017: 25/30; 02/14/2018: deferred to next visit; 03/16/2018: 28/30    Time  8    Period  Weeks    Status  Achieved      PT LONG TERM GOAL #3   Title  Patient will improve 5xSTS to under 16 seconds without use of a pillow to indicate functional improvement of LE function and decreased fall risk    Baseline  19 sec with pillow under chair; 09/01/2017 12 seconds with use of a pillow; 09/30/2017: 10.9 sec with use of pillow; defered today secondary to increased soreness from walking the previous day    Time  8    Period  Weeks    Status  Partially Met      PT LONG TERM GOAL #4   Title  Patient will be able to balance for 10sec with SLS to improve static balance and ability to get dressed when in standing    Baseline  1 sec; 09/01/2017: 3 sec each leg; 09/30/17: 4.5; 11/03/2017: 5 sec; 02/14/2018: Deffered to next visit; 03/16/2018: 7sec; 04/18/2018:7 sec; 05/18/2018; 5sec; 06/29/2018: 6sec     Time  8    Period  Weeks    Status  Partially Met      PT LONG TERM GOAL #5   Title  Patient will have a worst lumbar pain of a 3/10 over the past week to indicate functional improvement with performing standing activities and prolonged sitting.    Baseline  Worst pain: 8/10; 02/14/2018: worst pain: 7/10; 03/16/2018: 3/10    Time  6    Period  Weeks    Status  Achieved      Additional Long Term Goals   Additional Long Term Goals  Yes      PT LONG TERM GOAL #6   Title  Patient will be able to sit for >1 hour to improve abiltity to drive without increase in pain.    Baseline  Increased pain after driving for <50PTW; 6/56/8127: Patient reports increase in  pain for 47mn;  03/16/2018: 1 hour    Time  6    Period  Weeks    Status  Achieved      PT LONG TERM GOAL #7   Title  Patient will improve 78mnWT to over 14043fto improve ability to perform endurance based acitivities such as walking to the mailbox without increase in fatigue    Baseline  112056f10/30/2019: 1380f53f2/05/2018: 1340ft72fTime  6    Period  Weeks    Status  On-going      PT LONG TERM GOAL #8   Title  Patient will be able to perform sit to stands in less than 12 sec  without UE support to better be able to raise from a chair with less difficulty.    Baseline  13.5 sec     Time  6    Period  Weeks    Status  New            Plan - 07/26/18 1509    Clinical Impression Statement  Added exercises to HEP to continue benefits of therapy to be performed at home. Further educated on pain science and patient demonstrated superficial understanding and requires more education. Patient will benefit from further skilled therapy to return to prior level of function.     Rehab Potential  Good    Clinical Impairments Affecting Rehab Potential  Positive: motivation; Negative: , age    PT Frequency  2x / week    PT Duration  6 weeks    PT Treatment/Interventions  ADLs/Self Care Home Management;Aquatic Therapy;Biofeedback;DME Instruction;Gait training;Stair training;Functional mobility training;Therapeutic activities;Therapeutic exercise;Balance training;Neuromuscular re-education;Patient/family education;Passive range of motion;Energy conservation;Ultrasound;Moist Heat;Iontophoresis 4mg/m32mexamethasone;Cryotherapy;Electrical Stimulation;Manual techniques;Dry needling    PT Next Visit Plan  progress core and back strengthening    PT Home Exercise Plan  see education section    Consulted and Agree with Plan of Care  Patient       Patient will benefit from skilled therapeutic intervention in order to improve the following deficits and impairments:  Pain, Increased fascial  restricitons, Decreased coordination, Decreased mobility, Decreased activity tolerance, Decreased endurance, Decreased range of motion, Decreased strength, Hypomobility, Postural dysfunction, Abnormal gait, Decreased balance, Difficulty walking  Visit Diagnosis: History of falling  Unsteadiness on feet     Problem List Patient Active Problem List   Diagnosis Date Noted  . Right hip pain 07/08/2018  . Chronic bilateral low back pain without sciatica 12/31/2017  . Hypomagnesemia 09/29/2017  . Hypokalemia 09/29/2017  . Atrial fibrillation (HCC) 0[place]2/2019  . Trapezius strain, right, initial encounter 09/10/2017  . Depression, major, single episode, mild (HCC) 0[place]2/2019  . Chronic venous insufficiency 09/09/2017  . Lymphedema 09/09/2017  . Pain of toe of left foot 08/17/2017  . Grief 08/17/2017  . Epistaxis 07/31/2017  . Leg swelling 07/31/2017  . Dry eyes 07/06/2017  . Pain and swelling of lower extremity, right 03/29/2017  . Right shoulder pain 03/29/2017  . Diarrhea 05/18/2016  . Fall 05/01/2016  . Unintentional weight loss 12/30/2015  . Leg weakness, bilateral 12/30/2015  . Hyperkalemia 01/01/2015  . Occasional numbness/prickling/tingling of fingers and toes 12/18/2014  . Routine general medical examination at a health care facility 12/13/2013  . Macrocytic anemia 05/19/2013  . Psoriatic arthritis (HCC) 0[place]8/2014  . Psoriasis   . Osteoarthritis, multiple sites   . Episodic mood disorder (HCC) 0[place]4/2013  . History of breast cancer 10/28/2011  . GERD (gastroesophageal reflux disease)   . Hyperlipidemia   . Hypertension   .  Osteopenia   . Carotid stenosis 05/27/2011    Blythe Stanford, PT DPT 07/26/2018, 3:17 PM  [facility] [address] [place], [address] Phone: [phone]   Fax:  [phone]  Name: [name] MRN: [id number] Date of Birth: 01-27-1941

## 2018-08-01 ENCOUNTER — Telehealth: Payer: Self-pay

## 2018-08-01 NOTE — Telephone Encounter (Signed)
Copied from [place] [id number]. Topic: General - Other >> Aug 01, 2018 12:32 PM Carolyn Stare wrote:  Pt call to say her MRI has been scheduled and she is calling for a RX to be called in do to her being claustrophobia    [facility] [address]

## 2018-08-03 ENCOUNTER — Other Ambulatory Visit: Payer: Self-pay

## 2018-08-03 DIAGNOSIS — I6523 Occlusion and stenosis of bilateral carotid arteries: Secondary | ICD-10-CM

## 2018-08-03 NOTE — Telephone Encounter (Signed)
The patient currently has a prescription for ativan. She can take a dose of this 30 minutes prior to her MRI. She will need to have someone to transport her to and from her MRI. If she does not have transportation she should not take the ativan. Thanks.

## 2018-08-04 ENCOUNTER — Ambulatory Visit: Payer: Medicare Other

## 2018-08-04 DIAGNOSIS — Z8781 Personal history of (healed) traumatic fracture: Secondary | ICD-10-CM | POA: Diagnosis not present

## 2018-08-04 DIAGNOSIS — Z96641 Presence of right artificial hip joint: Secondary | ICD-10-CM | POA: Diagnosis not present

## 2018-08-04 NOTE — Telephone Encounter (Signed)
She can take 1 mg 30 minutes prior to the MRI.  She will need to have somebody drive her to and from the MRI.  Thanks.

## 2018-08-04 NOTE — Telephone Encounter (Signed)
Patient does not think her current dose of .5mg  will not be enough please advise.

## 2018-08-04 NOTE — Telephone Encounter (Signed)
Patient has been informed and stated she will comply.

## 2018-08-06 NOTE — Progress Notes (Signed)
HISTORY AND PHYSICAL     CC:  follow up. Requesting Provider:  [name], MD  HPI: This is a 78 y.o. female here for follow up for carotid artery stenosis.  She is s/p left CEA April 2006 by Dr. Scot Dock.  She was last seen in 2017 and at that time she was asymptomatic.    She presents today for follow up.  She states that she has done well since her last visit.  She states that she does have some right hand clumsiness on occasion and she attributes this to arthritis.  She does notice this b/c it is how she presented when she had a stroke in 2006.    Since her last visit, she has been dx with afib and is on Pradaxa.  She states that tomorrow is the anniversary of her husband's death and she went to the doctor a month or two later and was diagnosed.    The pt is not on a statin for cholesterol management.  The pt is not diabetic. The pt is on a beta blocker for hypertension.   The pt has remote tobacco use-quit 2002.  Past Medical History:  Diagnosis Date  . (HFpEF) heart failure with preserved ejection fraction ([place])    a. 08/2017 Echo: EF 55-60%, no rwma, mild MR, mildly dil LA, nl RV fxn.  . Breast cancer ([place]) 2001   left breast  . Cancer ([place]) 2001   left breast ca  . Carotid arterial disease ([place])    a. 10/2004 s/p L CEA; b. 12/2015 Carotid U/S: RICA 1-39%; b. LICA patent CEA site.  Marland Kitchen GERD (gastroesophageal reflux disease)   . Hyperlipidemia   . Hypertension   . Osteoarthritis, multiple sites   . Osteopenia   . Persistent atrial fibrillation    a. Dx 08/2017; b. CHA2DS2VASc = 6-->Xarelto; c. 09/2017 Successful DCCV (second shock - 200J);   Marland Kitchen Personal history of radiation therapy 2001   left breast ca  . Psoriasis     Past Surgical History:  Procedure Laterality Date  . ABDOMINAL HYSTERECTOMY    . ANKLE FRACTURE SURGERY  4/08   left---hardware still in place  . 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 BREAST EXCISIONAL BIOPSY Left yrs ago   benign  . BREAST  LUMPECTOMY Left 2001   f/u radiation  . CARDIOVERSION N/A 10/04/2017   Procedure: CARDIOVERSION;  Surgeon: Wellington Hampshire, MD;  Location: [facility] ORS;  Service: Cardiovascular;  Laterality: N/A;  . CARDIOVERSION N/A 12/16/2017   Procedure: CARDIOVERSION;  Surgeon: [name], MD;  Location: [facility] ORS;  Service: Cardiovascular;  Laterality: N/A;  . CAROTID ENDARTERECTOMY Left 10/23/2004  . FRACTURE SURGERY    . OOPHORECTOMY    . SHOULDER SURGERY  6/07   left  . TONSILLECTOMY AND ADENOIDECTOMY    . TOTAL HIP ARTHROPLASTY  2004   right    No Known Allergies  Current Outpatient Medications  Medication Sig Dispense Refill  . Cholecalciferol (VITAMIN D3) 1000 units CAPS Take 1 capsule by mouth 2 (two) times daily.     . dabigatran (PRADAXA) 150 MG CAPS capsule Take 1 capsule (150 mg total) by mouth 2 (two) times daily. 180 capsule 3  . flecainide (TAMBOCOR) 50 MG tablet Take 1 tablet (50 mg total) by mouth 2 (two) times daily. 60 tablet 3  . furosemide (LASIX) 40 MG tablet TAKE 1 TABLET BY MOUTH EVERY DAY 30 tablet 3  . LORazepam (ATIVAN) 0.5 MG  tablet TAKE 1 TABLET BY MOUTH 2 TIMES DAILY AS NEEDED FOR ANXIETY 60 tablet 0  . metoprolol tartrate (LOPRESSOR) 50 MG tablet Take 1/2 tablet (25 mg) by mouth twice daily    . mirtazapine (REMERON) 15 MG tablet TAKE 1 TABLET (15 MG TOTAL) BY MOUTH AT BEDTIME. 30 tablet 3  . Multiple Vitamin (MULTIVITAMIN) capsule Take 1 capsule by mouth daily.      . Multiple Vitamins-Minerals (PRESERVISION AREDS 2) CAPS Take 1 tablet by mouth 2 (two) times daily.     No current facility-administered medications for this visit.     Family History  Problem Relation Age of Onset  . Hypertension Brother   . Pneumonia Mother   . Leukemia Father   . Heart disease Neg Hx   . Diabetes Neg Hx     Social History   Socioeconomic History  . Marital status: Widowed    Spouse name: Not on file  . Number of children: Not on file  . Years of education: Not on file    . Highest education level: Not on file  Occupational History  . 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 Financial resource strain: Not on file  . Food insecurity:    Worry: Not on file    Inability: Not on file  . Transportation needs:    Medical: Not on file    Non-medical: Not on file  Tobacco Use  . Smoking status: Former Smoker    Packs/day: 1.00    Years: 40.00    Pack years: 40.00    Types: Cigarettes    Last attempt to quit: 07/21/1995    Years since quitting: 23.0  . Smokeless tobacco: Never Used  Substance and Sexual Activity  . Alcohol use: Yes    Alcohol/week: 16.0 standard drinks    Types: 2 Glasses of wine, 14 Standard drinks or equivalent per week    Comment: daily  . Drug use: No  . Sexual activity: Never  Lifestyle  . Physical activity:    Days per week: Not on file    Minutes per session: Not on file  . Stress: Not on file  Relationships  . Social connections:    Talks on phone: Not on file    Gets together: Not on file    Attends religious service: Not on file    Active member of club or organization: Not on file    Attends meetings of clubs or organizations: Not on file    Relationship status: Not on file  . Intimate partner violence:    Fear of current or ex partner: Not on file    Emotionally abused: Not on file    Physically abused: Not on file    Forced sexual activity: Not on file  Other Topics Concern  . Not on file  Social History Narrative   1 natural child   3 adopted children   Artist---still teaches water colors   Husband has Alzheimers      Has living will   Daughter Amy is health care POA   DNR    No tube feeds if cognitively unaware     REVIEW OF SYSTEMS:   [X]  denotes positive finding, [ ]  denotes negative finding Cardiac  Comments:  Chest pain or chest pressure:    Shortness of breath upon exertion:    Short of breath when lying flat:    Irregular heart rhythm: x afib      Vascular  Pain in  calf, thigh, or hip brought on by ambulation:    Pain in feet at night that wakes you up from your sleep:     Blood clot in your veins:    Leg swelling:         Pulmonary    Oxygen at home:    Productive cough:     Wheezing:         Neurologic    Sudden weakness in arms or legs:     Sudden numbness in arms or legs:     Sudden onset of difficulty speaking or slurred speech:    Temporary loss of vision in one eye:     Problems with dizziness:         Gastrointestinal    Blood in stool:     Vomited blood:         Genitourinary    Burning when urinating:     Blood in urine:        Psychiatric    Major depression:         Hematologic    Bleeding problems:    Problems with blood clotting too easily:        Skin    Rashes or ulcers:        Constitutional    Fever or chills:      PHYSICAL EXAMINATION:  Today's Vitals   08/09/18 0907 08/09/18 0909  BP: (!) 168/79 (!) 145/79  Pulse: 64   Resp: 16   SpO2: 96%   Weight: 125 lb (56.7 kg)   Height: 5' 6.5" (1.689 m)    Body mass index is 19.87 kg/m.   General:  WDWN in NAD; vital signs documented above Gait: Not observed HENT: WNL, normocephalic Pulmonary: normal non-labored breathing , without Rales, rhonchi,  wheezing Cardiac: regular HR, without  Murmurs, rubs or gallops; without carotid bruits Abdomen: soft, NT, no masses Skin: without rashes Vascular Exam/Pulses:  Right Left  Radial 2+ (normal) 2+ (normal)  Popliteal Unable to palpate  Unable to palpate   AT 1+ (weak) 1+ (weak)  PT Unable to palpate  Unable to palpate    Extremities: without ischemic changes, without Gangrene , without cellulitis; without open wounds;  Musculoskeletal: no muscle wasting or atrophy  Neurologic: A&O X 3; 5/5 grip bilaterally Psychiatric:  The pt has Normal affect.   Non-Invasive Vascular Imaging:   Carotid Duplex on 08/09/2018: Right:  1-39% ICA stenosis Left:  40-59% ICA stenosis Elevated diastolic velocity may  be due to tortuosity. Vertebrals:  Right vertebral artery demonstrates antegrade flow. Left vertebral              artery demonstrates retrograde flow. Left vertebral artery              demonstrates high resistant flow. Subclavians: Left subclavian artery was stenotic. Right subclavian artery flow              was disturbed. Previous Carotid duplex on 01/15/16: Right:  1-39% ICA stenosis Left:  Patent left CEA site Proximal left subclavian artery stenosis (No significant change from previous exam on 05/05/14)   Pt meds includes: Statin:  No. Beta Blocker:  Yes.   Aspirin:  No ACEI:  No. ARB:  No. CCB use:  No Other Antiplatelet/Anticoagulant:  Yes Pradaxa   ASSESSMENT/PLAN:: 78 y.o. female here for follow up carotid artery stenosis. She is s/p left CEA by Dr. Scot Dock in April 2006.   -pt has done well since her  last visit.  She state she has noticed some clumsiness in her right hand but attributes this to arthritis.  Her velocities are in the low range for 40-59% and may be increased due to tortuosity.  I discussed with Dr. Donnetta Hutching and do not feel this is related to her carotid disease.  Will have her follow up in 1 year with repeat carotid duplex.  If it remains stable, will go back to every 2 years.  -discussed sx of stroke and she knows to go to the ER should she develop any of these sx.  -she is not on an asa due to new dx of afib last year.  -she is not on a statin as her cholesterol is in good range.    Leontine Locket, PA-C [facility] [phone]  Clinic MD:   [place]

## 2018-08-07 ENCOUNTER — Other Ambulatory Visit: Payer: Self-pay | Admitting: Internal Medicine

## 2018-08-09 ENCOUNTER — Ambulatory Visit (INDEPENDENT_AMBULATORY_CARE_PROVIDER_SITE_OTHER): Payer: Medicare Other | Admitting: Physician Assistant

## 2018-08-09 ENCOUNTER — Other Ambulatory Visit: Payer: Self-pay

## 2018-08-09 ENCOUNTER — Ambulatory Visit (HOSPITAL_COMMUNITY)
Admission: RE | Admit: 2018-08-09 | Discharge: 2018-08-09 | Disposition: A | Discharge status: Home / Self Care | Payer: Medicare Other | Source: Ambulatory Visit | Attending: Vascular Surgery | Admitting: Vascular Surgery

## 2018-08-09 ENCOUNTER — Encounter: Payer: Self-pay | Admitting: Physician Assistant

## 2018-08-09 VITALS — BP 145/79 | HR 64 | Resp 16 | Ht 66.5 in | Wt 125.0 lb

## 2018-08-09 DIAGNOSIS — I6523 Occlusion and stenosis of bilateral carotid arteries: Secondary | ICD-10-CM | POA: Diagnosis not present

## 2018-08-16 ENCOUNTER — Other Ambulatory Visit: Payer: Self-pay | Admitting: Family Medicine

## 2018-08-16 ENCOUNTER — Ambulatory Visit
Admission: RE | Admit: 2018-08-16 | Discharge: 2018-08-16 | Disposition: A | Discharge status: Home / Self Care | Payer: Medicare Other | Source: Ambulatory Visit | Attending: Family Medicine | Admitting: Family Medicine

## 2018-08-16 DIAGNOSIS — M545 Low back pain: Secondary | ICD-10-CM | POA: Diagnosis not present

## 2018-08-16 DIAGNOSIS — M5136 Other intervertebral disc degeneration, lumbar region: Secondary | ICD-10-CM | POA: Diagnosis not present

## 2018-08-24 ENCOUNTER — Other Ambulatory Visit: Payer: Self-pay | Admitting: Family Medicine

## 2018-08-24 DIAGNOSIS — M5136 Other intervertebral disc degeneration, lumbar region: Secondary | ICD-10-CM

## 2018-08-24 DIAGNOSIS — M48061 Spinal stenosis, lumbar region without neurogenic claudication: Secondary | ICD-10-CM

## 2018-09-06 DIAGNOSIS — M5136 Other intervertebral disc degeneration, lumbar region: Secondary | ICD-10-CM | POA: Diagnosis not present

## 2018-09-13 ENCOUNTER — Ambulatory Visit: Payer: Self-pay

## 2018-09-13 NOTE — Telephone Encounter (Addendum)
Message from Nani Ravens sent at 09/13/2018 9:34 AM EST   Pt called in to ask. IF she get a cold what medications could she take with her current medications? Pt says that she isn't currently experiencing any symptoms.    Attempted to call patient back 3 times. No answer, left message once.  Routing to flow at [facility] at [facility].

## 2018-09-13 NOTE — Telephone Encounter (Signed)
  Reason for Disposition . General information question, no triage required and triager able to answer question  Protocols used: INFORMATION ONLY CALL-A-AH  

## 2018-09-13 NOTE — Telephone Encounter (Signed)
  Answer Assessment - Initial Assessment Questions 1. REASON FOR CALL or QUESTION: "What is your reason for calling today?" or "How can I best help you?" or "What question do you have that I can help answer?"     Asking about recommendations for OTC cold medications; reported not having any symptoms; wanted the information just in case.  Protocols used: INFORMATION ONLY CALL-A-AH

## 2018-09-13 NOTE — Telephone Encounter (Signed)
Returned call to pt.  Stated she does not have any cold symptoms, but wanted to have some guidance if she developed cold symptoms, on what she should take.  Advised pt. That it is difficult to give OTC medication recommendation since she is not having any specific symptoms.  Encouraged her to check with her pharmacist when choosing which OTC medications are safe to take with her current medications.  Also, advised to call and speak to a nurse if she develops cold symptoms that she is seeking advice on how to treat.  Pt. Verb. Understanding.

## 2018-10-09 ENCOUNTER — Other Ambulatory Visit: Payer: Self-pay | Admitting: Family Medicine

## 2018-11-07 ENCOUNTER — Other Ambulatory Visit: Payer: Self-pay | Admitting: Family Medicine

## 2018-11-07 NOTE — Telephone Encounter (Signed)
Refilled: 05/17/2018 Last OV: 07/08/2018 Next OV: 01/11/2019

## 2018-12-06 DIAGNOSIS — H353132 Nonexudative age-related macular degeneration, bilateral, intermediate dry stage: Secondary | ICD-10-CM | POA: Diagnosis not present

## 2018-12-16 ENCOUNTER — Ambulatory Visit (INDEPENDENT_AMBULATORY_CARE_PROVIDER_SITE_OTHER): Payer: Medicare Other | Admitting: Podiatry

## 2018-12-16 ENCOUNTER — Other Ambulatory Visit: Payer: Self-pay

## 2018-12-16 ENCOUNTER — Encounter: Payer: Self-pay | Admitting: Podiatry

## 2018-12-16 VITALS — Temp 97.1°F

## 2018-12-16 DIAGNOSIS — M79676 Pain in unspecified toe(s): Secondary | ICD-10-CM | POA: Diagnosis not present

## 2018-12-16 DIAGNOSIS — B351 Tinea unguium: Secondary | ICD-10-CM

## 2018-12-19 NOTE — Progress Notes (Signed)
   SUBJECTIVE Patient presents to office today complaining of elongated, thickened nails that cause pain while ambulating in shoes. She is unable to trim her own nails. Patient is here for further evaluation and treatment.  Past Medical History:  Diagnosis Date  . (HFpEF) heart failure with preserved ejection fraction ([place])    a. 08/2017 Echo: EF 55-60%, no rwma, mild MR, mildly dil LA, nl RV fxn.  . Breast cancer ([place]) 2001   left breast  . Cancer ([place]) 2001   left breast ca  . Carotid arterial disease ([place])    a. 10/2004 s/p L CEA; b. 12/2015 Carotid U/S: RICA 1-39%; b. LICA patent CEA site.  Marland Kitchen GERD (gastroesophageal reflux disease)   . Hyperlipidemia   . Hypertension   . Osteoarthritis, multiple sites   . Osteopenia   . Persistent atrial fibrillation    a. Dx 08/2017; b. CHA2DS2VASc = 6-->Xarelto; c. 09/2017 Successful DCCV (second shock - 200J);   Marland Kitchen Personal history of radiation therapy 2001   left breast ca  . Psoriasis     OBJECTIVE General Patient is awake, alert, and oriented x 3 and in no acute distress. Derm Skin is dry and supple bilateral. Negative open lesions or macerations. Remaining integument unremarkable. Nails are tender, long, thickened and dystrophic with subungual debris, consistent with onychomycosis, 1-5 bilateral. No signs of infection noted. Vasc  DP and PT pedal pulses palpable bilaterally. Temperature gradient within normal limits.  Neuro Epicritic and protective threshold sensation grossly intact bilaterally.  Musculoskeletal Exam No symptomatic pedal deformities noted bilateral. Muscular strength within normal limits.  ASSESSMENT 1. Onychodystrophic nails 1-5 bilateral with hyperkeratosis of nails.  2. Onychomycosis of nail due to dermatophyte bilateral 3. Pain in foot bilateral  PLAN OF CARE 1. Patient evaluated today.  2. Instructed to maintain good pedal hygiene and foot care.  3. Mechanical debridement of nails 1-5 bilaterally performed using a  nail nipper. Filed with dremel without incident.  4. Return to clinic in 3 mos.    Edrick Kins, DPM [facility]  Dr. Edrick Kins, [address]                                        [place], [place] [address]                Office [phone]  Fax [phone]

## 2018-12-20 ENCOUNTER — Telehealth: Payer: Self-pay

## 2018-12-20 MED ORDER — FLECAINIDE ACETATE 50 MG PO TABS: 50.0000 mg | ORAL_TABLET | Freq: Two times a day (BID) | ORAL | 3 refills | Status: DC

## 2018-12-20 NOTE — Telephone Encounter (Signed)
Requested Prescriptions   Signed Prescriptions Disp Refills  . flecainide (TAMBOCOR) 50 MG tablet 60 tablet 3    Sig: Take 1 tablet (50 mg total) by mouth 2 (two) times daily.    Authorizing Provider: [name]    Ordering User: Raelene Bott, Juergen Hardenbrook L

## 2018-12-26 ENCOUNTER — Other Ambulatory Visit: Payer: Self-pay | Admitting: Family Medicine

## 2018-12-26 DIAGNOSIS — Z1231 Encounter for screening mammogram for malignant neoplasm of breast: Secondary | ICD-10-CM

## 2019-01-02 ENCOUNTER — Telehealth: Payer: Self-pay

## 2019-01-02 MED ORDER — METOPROLOL TARTRATE 50 MG PO TABS: ORAL_TABLET | ORAL | 0 refills | Status: DC

## 2019-01-02 NOTE — Telephone Encounter (Signed)
Requested Prescriptions   Signed Prescriptions Disp Refills  . metoprolol tartrate (LOPRESSOR) 50 MG tablet 90 tablet 0    Sig: Take 1/2 tablet (25 mg) by mouth twice daily    Authorizing Provider: [name]    Ordering User: NEWCOMER MCCLAIN, BRANDY L  ;

## 2019-01-03 ENCOUNTER — Telehealth: Payer: Self-pay

## 2019-01-03 MED ORDER — METOPROLOL TARTRATE 50 MG PO TABS: ORAL_TABLET | ORAL | 0 refills | Status: DC

## 2019-01-03 NOTE — Telephone Encounter (Signed)
See crm

## 2019-01-03 NOTE — Addendum Note (Signed)
Addended by: Raelene Bott, BRANDY L on: 01/03/2019 01:21 PM   Modules accepted: Orders

## 2019-01-03 NOTE — Telephone Encounter (Signed)
Metoprolol Rx resent per pharmacy request. Requested Prescriptions   Signed Prescriptions Disp Refills  . metoprolol tartrate (LOPRESSOR) 50 MG tablet 90 tablet 0    Sig: Take 1/2 tablet (25 mg) by mouth twice daily    Authorizing Provider: [name]    Ordering User: Raelene Bott, Stephaie Dardis L

## 2019-01-05 ENCOUNTER — Telehealth: Payer: Self-pay

## 2019-01-05 ENCOUNTER — Other Ambulatory Visit: Payer: Self-pay

## 2019-01-05 NOTE — Telephone Encounter (Signed)
Called patient.  No answer.  [place] office.  Please reach out to patient regarding in office visit.

## 2019-01-05 NOTE — Telephone Encounter (Signed)
No message attached.  Tried to call patient.  LMTCB.

## 2019-01-05 NOTE — Telephone Encounter (Signed)
Copied from [place] [id number]. Topic: Appointment Scheduling - Scheduling Inquiry for Clinic >> Jan 03, 2019 12:35 PM Scherrie Gerlach wrote: Reason for CRM: pt has appt 6/23 and wants to make sure this is in office visit.  If not, please reschedule to a day she can come in.

## 2019-01-11 ENCOUNTER — Other Ambulatory Visit: Payer: Self-pay

## 2019-01-11 ENCOUNTER — Ambulatory Visit (INDEPENDENT_AMBULATORY_CARE_PROVIDER_SITE_OTHER): Payer: Medicare Other

## 2019-01-11 ENCOUNTER — Encounter: Payer: Self-pay | Admitting: Family Medicine

## 2019-01-11 ENCOUNTER — Ambulatory Visit (INDEPENDENT_AMBULATORY_CARE_PROVIDER_SITE_OTHER): Payer: Medicare Other | Admitting: Family Medicine

## 2019-01-11 VITALS — BP 160/80 | HR 53 | Temp 98.6°F | Ht 66.0 in | Wt 126.2 lb

## 2019-01-11 DIAGNOSIS — I6523 Occlusion and stenosis of bilateral carotid arteries: Secondary | ICD-10-CM | POA: Diagnosis not present

## 2019-01-11 DIAGNOSIS — F320 Major depressive disorder, single episode, mild: Secondary | ICD-10-CM | POA: Diagnosis not present

## 2019-01-11 DIAGNOSIS — M25841 Other specified joint disorders, right hand: Secondary | ICD-10-CM | POA: Diagnosis not present

## 2019-01-11 DIAGNOSIS — M545 Low back pain, unspecified: Secondary | ICD-10-CM

## 2019-01-11 DIAGNOSIS — G8929 Other chronic pain: Secondary | ICD-10-CM

## 2019-01-11 DIAGNOSIS — M79641 Pain in right hand: Secondary | ICD-10-CM

## 2019-01-11 DIAGNOSIS — M25551 Pain in right hip: Secondary | ICD-10-CM

## 2019-01-11 DIAGNOSIS — I10 Essential (primary) hypertension: Secondary | ICD-10-CM | POA: Diagnosis not present

## 2019-01-11 DIAGNOSIS — I4891 Unspecified atrial fibrillation: Secondary | ICD-10-CM | POA: Diagnosis not present

## 2019-01-11 LAB — COMPREHENSIVE METABOLIC PANEL
ALT: 9 U/L (ref 0–35)
AST: 17 U/L (ref 0–37)
Albumin: 4.4 g/dL (ref 3.5–5.2)
Alkaline Phosphatase: 68 U/L (ref 39–117)
BUN: 22 mg/dL (ref 6–23)
CO2: 28 mEq/L (ref 19–32)
Calcium: 9.6 mg/dL (ref 8.4–10.5)
Chloride: 101 mEq/L (ref 96–112)
Creatinine, Ser: 0.93 mg/dL (ref 0.40–1.20)
GFR: 58.03 mL/min — ABNORMAL LOW (ref >60.00)
Glucose, Bld: 88 mg/dL (ref 70–99)
Potassium: 4.7 mEq/L (ref 3.5–5.1)
Sodium: 140 mEq/L (ref 135–145)
Total Bilirubin: 0.6 mg/dL (ref 0.2–1.2)
Total Protein: 6.8 g/dL (ref 6.0–8.3)

## 2019-01-11 LAB — CBC
HCT: 37.5 % (ref 36.0–46.0)
Hemoglobin: 12.4 g/dL (ref 12.0–15.0)
MCHC: 33 g/dL (ref 30.0–36.0)
MCV: 109.9 fl — ABNORMAL HIGH (ref 78.0–100.0)
Platelets: 243 10*3/uL (ref 150.0–400.0)
RBC: 3.41 Mil/uL — ABNORMAL LOW (ref 3.87–5.11)
RDW: 13.1 % (ref 11.5–15.5)
WBC: 6.1 10*3/uL (ref 4.0–10.5)

## 2019-01-11 LAB — LIPID PANEL
Cholesterol: 216 mg/dL — ABNORMAL HIGH (ref 0–200)
HDL: 88.9 mg/dL (ref >39.00)
LDL Cholesterol: 104 mg/dL — ABNORMAL HIGH (ref 0–99)
NonHDL: 126.72
Total CHOL/HDL Ratio: 2
Triglycerides: 116 mg/dL (ref 0.0–149.0)
VLDL: 23.2 mg/dL (ref 0.0–40.0)

## 2019-01-11 NOTE — Assessment & Plan Note (Signed)
Refer back to orthopedics for further evaluation.  Discussed possible referral to Dr. Tamala Julian of [facility] sports medicine though the patient does not want to drive to [facility] at this time.

## 2019-01-11 NOTE — Progress Notes (Signed)
[name], MD Phone: [phone]  [name] is a 78 y.o. female who presents today for follow-up.  Arthritis: Patient notes history of psoriatic arthritis.  She does have some new bumps on her fingers.  She has an enlarged bump on the tip of her left thumb as well as her right tip of middle finger and tip of right index finger.  Notes they do hurt at times.  They appear to be getting bigger.  They do not drain.  She notes no raynauds phenomenon.  She does note joint enlargement within the PIP and DIP joints in her bilateral hands that is chronic.  She has never seen rheumatology.  A. fib: Taking Pradaxa, flecainide, and metoprolol.  No palpitations.  Depression: She notes no depression or anxiety symptoms.  Remeron has been beneficial.  No SI.  Low back pain: This is a chronic issue.  She notes it does continue to bother her.  No radiation.  No numbness.  She does feel her legs are weak overall.  No incontinence.  She does not want any intervention for this.  She wonders about anti-inflammatory use.  Right hip pain: She has right anterior hip pain that particularly bothers her when she tries to flex her hip and is significantly painful.  She did see orthopedics and it appears they advised home exercise program and discussed that the groin pain may be referred from her lumbar spine.  She saw neurosurgery and they did not think this was related to nerve impingement in her lumbar spine.  Social History   Tobacco Use  Smoking Status Former Smoker  . Packs/day: 1.00  . Years: 40.00  . Pack years: 40.00  . Types: Cigarettes  . Quit date: 07/21/1995  . Years since quitting: 23.4  Smokeless Tobacco Never Used     ROS see history of present illness  Objective  Physical Exam Vitals:   01/11/19 0911 01/11/19 0939  BP: (!) 160/80 (!) 160/80  Pulse: (!) 53   Temp: 98.6 F (37 C)   SpO2: 98%     BP Readings from Last 3 Encounters:  01/11/19 (!) 160/80  08/09/18 (!)  145/79  07/08/18 120/78   Wt Readings from Last 3 Encounters:  01/11/19 126 lb 3.2 oz (57.2 kg)  08/09/18 125 lb (56.7 kg)  07/08/18 125 lb 6.4 oz (56.9 kg)    Physical Exam Constitutional:      General: She is not in acute distress.    Appearance: She is not diaphoretic.  Cardiovascular:     Rate and Rhythm: Normal rate and regular rhythm.     Heart sounds: Normal heart sounds.  Pulmonary:     Effort: Pulmonary effort is normal.     Breath sounds: Normal breath sounds.  Musculoskeletal:     Comments: Patient does have nodular enlargement of a portion of the palmar tip of her left thumb and a portion of the palmar tip of her right middle finger and right index finger, these are nontender, there is no excessive warmth, there is no erythema, there is no drainage, she has enlarged PIP and DIP joints in all of her fingers, no midline spine tenderness, no midline spine step-off, no muscular back tenderness, there is tenderness over the anterior portion of her right hip and there is discomfort on resisted flexion and active flexion of her right hip though no discomfort on passive flexion of her right hip or internal rotation or external rotation  Skin:    General: Skin  is warm and dry.  Neurological:     Mental Status: She is alert.     Comments: 5/5 strength bilateral quads, hamstrings, plantar flexion, and dorsiflexion, sensation to light touch intact bilateral lower extremities, 2+ patellar reflexes      Assessment/Plan: Please see individual problem list.  Atrial fibrillation (HCC) Sinus rhythm.  She will continue her current regimen through cardiology.  Chronic bilateral low back pain without sciatica Chronic issue.  She does not want any procedures for this.  We will check lab work today and determine if we are able to use an NSAID.  Depression, major, single episode, mild (HCC) Improved.  Continue Remeron.  Right hip pain Refer back to orthopedics for further evaluation.   Discussed possible referral to Dr. Tamala Julian of [facility] sports medicine though the patient does not want to drive to [facility] at this time.  Right hand pain X-ray ordered to evaluate for underlying arthritic cause.  Discussed likely referral to rheumatology for evaluation for psoriatic arthritis and potentially for some connective tissue issue that could be causing her finger lesions.   Orders Placed This Encounter  Procedures  . DG Hand Complete Right    Standing Status:   Future    Number of Occurrences:   1    Standing Expiration Date:   03/12/2020    Order Specific Question:   Reason for Exam (SYMPTOM  OR DIAGNOSIS REQUIRED)    Answer:   joint enlargement in DIP and PIP joints, soft tissue firmness and swelling at the tip of the right middle finger and right index finger    Order Specific Question:   Preferred imaging location?    Answer:   [facility] Specific Question:   Radiology Contrast Protocol - do NOT remove file path    Answer:   _0 [email or web address]  . Comp Met (CMET)  . CBC  . Lipid panel  . Ambulatory referral to Orthopedic Surgery    Referral Priority:   Routine    Referral Type:   Surgical    Referral Reason:   Specialty Services Required    Requested Specialty:   Orthopedic Surgery    Number of Visits Requested:   1    No orders of the defined types were placed in this encounter.    [name], MD [place]

## 2019-01-11 NOTE — Assessment & Plan Note (Signed)
Sinus rhythm.  She will continue her current regimen through cardiology.

## 2019-01-11 NOTE — Assessment & Plan Note (Signed)
X-ray ordered to evaluate for underlying arthritic cause.  Discussed likely referral to rheumatology for evaluation for psoriatic arthritis and potentially for some connective tissue issue that could be causing her finger lesions.

## 2019-01-11 NOTE — Assessment & Plan Note (Signed)
Improved.  Continue Remeron.

## 2019-01-11 NOTE — Patient Instructions (Addendum)
Nice to see you. We will get an x-ray today. We will refer you back to Dr. Marry Guan. We will get lab work today and contact you with the results.

## 2019-01-11 NOTE — Assessment & Plan Note (Signed)
Chronic issue.  She does not want any procedures for this.  We will check lab work today and determine if we are able to use an NSAID.

## 2019-01-19 ENCOUNTER — Other Ambulatory Visit: Payer: Self-pay | Admitting: Family Medicine

## 2019-01-19 DIAGNOSIS — L4050 Arthropathic psoriasis, unspecified: Secondary | ICD-10-CM

## 2019-01-19 DIAGNOSIS — R2233 Localized swelling, mass and lump, upper limb, bilateral: Secondary | ICD-10-CM

## 2019-01-23 ENCOUNTER — Telehealth: Payer: Self-pay | Admitting: *Deleted

## 2019-01-23 NOTE — Telephone Encounter (Signed)
Copied from [place] [phone]. Topic: General - Other >> Jan 23, 2019  8:50 AM Rayann Heman wrote: Reason for CRM: pt called and stated that she would like a call back from the nurse regarding xray results. Please advise

## 2019-01-24 NOTE — Telephone Encounter (Signed)
Gave patient xray results and scheduled her to see L. guse because a knot on her hand has opened up.  Nina,cma

## 2019-01-25 ENCOUNTER — Other Ambulatory Visit: Payer: Self-pay

## 2019-01-25 ENCOUNTER — Encounter: Payer: Self-pay | Admitting: Family Medicine

## 2019-01-25 ENCOUNTER — Ambulatory Visit (INDEPENDENT_AMBULATORY_CARE_PROVIDER_SITE_OTHER): Payer: Medicare Other | Admitting: Family Medicine

## 2019-01-25 VITALS — BP 142/96 | HR 59 | Temp 98.1°F | Resp 20 | Ht 66.0 in | Wt 125.8 lb

## 2019-01-25 DIAGNOSIS — L4050 Arthropathic psoriasis, unspecified: Secondary | ICD-10-CM

## 2019-01-25 DIAGNOSIS — L729 Follicular cyst of the skin and subcutaneous tissue, unspecified: Secondary | ICD-10-CM

## 2019-01-25 NOTE — Progress Notes (Signed)
Subjective:    Patient ID: [name], female    DOB: 03/04/1941, 78 y.o.   MRN: [id number]  HPI   Patient presents to clinic due to knots on both hands.  Patient does have a known history of psoriatic arthritis and rheumatology referral is in place.  Did have recent x-ray of right hand showing osteoarthritis as well.  Patient states she has these lumps on the left thumb and right index finger that are especially concerning, states the one on left thumb did drain some clear fluid the other day.  Does not think these lumpy areas are related to her arthritis.  Denies any pus like drainage from the areas or any skin redness/heat of skin that would indicate a cellulitis infection.  Patient Active Problem List   Diagnosis Date Noted  . Right hip pain 07/08/2018  . Chronic bilateral low back pain without sciatica 12/31/2017  . Atrial fibrillation ([place]) 09/10/2017  . Trapezius strain, right, initial encounter 09/10/2017  . Depression, major, single episode, mild ([place]) 09/10/2017  . Chronic venous insufficiency 09/09/2017  . Lymphedema 09/09/2017  . Pain of toe of left foot 08/17/2017  . Grief 08/17/2017  . Leg swelling 07/31/2017  . Dry eyes 07/06/2017  . Pain and swelling of lower extremity, right 03/29/2017  . Right shoulder pain 03/29/2017  . Diarrhea 05/18/2016  . Unintentional weight loss 12/30/2015  . Leg weakness, bilateral 12/30/2015  . Occasional numbness/prickling/tingling of fingers and toes 12/18/2014  . Macrocytic anemia 05/19/2013  . Psoriatic arthritis ([place]) 02/03/2013  . Right hand pain 08/05/2012  . Psoriasis   . Osteoarthritis, multiple sites   . Episodic mood disorder ([place]) 12/01/2011  . History of breast cancer 10/28/2011  . GERD (gastroesophageal reflux disease)   . Hyperlipidemia   . Hypertension   . Osteopenia   . Carotid stenosis 05/27/2011   Social History   Tobacco Use  . Smoking status: Former Smoker    Packs/day: 1.00    Years: 40.00   Pack years: 40.00    Types: Cigarettes    Quit date: 07/21/1995    Years since quitting: 23.5  . Smokeless tobacco: Never Used  Substance Use Topics  . Alcohol use: Yes    Alcohol/week: 16.0 standard drinks    Types: 2 Glasses of wine, 14 Standard drinks or equivalent per week    Comment: daily   Review of Systems   Constitutional: Negative for chills, fatigue and fever.  HENT: Negative for congestion, ear pain, sinus pain and sore throat.   Eyes: Negative.   Respiratory: Negative for cough, shortness of breath and wheezing.   Cardiovascular: Negative for chest pain, palpitations and leg swelling.  Gastrointestinal: Negative for abdominal pain, diarrhea, nausea and vomiting.  Genitourinary: Negative for dysuria, frequency and urgency.  Musculoskeletal: Knots on hands Skin: ?cyst on left thumb, right index finger Neurological: Negative for syncope, light-headedness and headaches.  Psychiatric/Behavioral: The patient is not nervous/anxious.       Objective:   Physical Exam Vitals signs and nursing note reviewed.  Constitutional:      General: She is not in acute distress.    Appearance: She is not toxic-appearing.  HENT:     Head: Normocephalic and atraumatic.  Cardiovascular:     Rate and Rhythm: Normal rate and regular rhythm.  Pulmonary:     Effort: Pulmonary effort is normal. No respiratory distress.  Musculoskeletal:     Comments: +knotty bilat hands. Has hx of psoriatic arthritis and referral to rheumatology  in place. Recent xray right hand 12/2018 shows OA in right hand  Skin:    General: Skin is warm and dry.     Comments: ?small cyst on fat pad of left thumb and fat pad of   Neurological:     Mental Status: She is alert and oriented to person, place, and time.  Psychiatric:        Mood and Affect: Mood normal.        Behavior: Behavior normal.    Vitals:   01/25/19 1053  BP: (!) 142/96  Pulse: (!) 59  Resp: 20  Temp: 98.1 F (36.7 C)  SpO2: 94%       Assessment & Plan:    Cyst of skin (dermoid?)- suspect patient could be having a small dermoid skin cyst on fat pads of left thumb and right index finger.  Patient advised that we can refer to dermatology to see if there is anything they can do, but if I were to drain them in clinic today they would only refill.  Patient advised to monitor skin for any signs of infection such as redness, heat, drainage of fluid or pus and to let us know if any of these occur.  Psoriatic arthritis/osteoarthritis-patient advised that the body growths on joints are most likely from longstanding arthritis.  She is aware that rheumatology referral is in place.  Also aware she can use Tylenol as needed for pain.  Patient advised to call us if she does not hear about dermatology referral in the next 7 to 10 days.  Otherwise keep regularly scheduled follow-up with PCP as planned.

## 2019-02-04 ENCOUNTER — Telehealth: Payer: Self-pay | Admitting: Physician Assistant

## 2019-02-04 MED ORDER — DABIGATRAN ETEXILATE MESYLATE 150 MG PO CAPS: 150.0000 mg | ORAL_CAPSULE | Freq: Two times a day (BID) | ORAL | 3 refills | Status: DC

## 2019-02-04 NOTE — Telephone Encounter (Signed)
   The patient called the answering service after-hours today requesting refill on Pradaxa. Most recent labs 12/2018 reviewed, dose remains appropriate per [facility] (48ml/min). Dr. Caryl Comes did not make any changes at 03/2018 OV. Per pt request will refill x 1 yr at [facility] [place] on [address]. Will cc to Dr. Caryl Comes so he is aware as well. (The patient states she's been trying to call for refill request on this but I do not see it in the system.) The patient verbalized understanding and gratitude.  Charlie Pitter PA-C

## 2019-02-07 ENCOUNTER — Ambulatory Visit (INDEPENDENT_AMBULATORY_CARE_PROVIDER_SITE_OTHER): Payer: Medicare Other

## 2019-02-07 ENCOUNTER — Ambulatory Visit
Admission: RE | Admit: 2019-02-07 | Discharge: 2019-02-07 | Disposition: A | Discharge status: Home / Self Care | Payer: Medicare Other | Source: Ambulatory Visit | Attending: Family Medicine | Admitting: Family Medicine

## 2019-02-07 DIAGNOSIS — Z0000 Encounter for general adult medical examination without abnormal findings: Secondary | ICD-10-CM

## 2019-02-07 DIAGNOSIS — Z1231 Encounter for screening mammogram for malignant neoplasm of breast: Secondary | ICD-10-CM | POA: Diagnosis not present

## 2019-02-07 NOTE — Progress Notes (Signed)
Subjective:   [name] is a 78 y.o. female who presents for Medicare Annual (Subsequent) preventive examination.  Review of Systems:  No ROS.  Medicare Wellness Virtual Visit.  Visual/audio telehealth visit, UTA vital signs.   See social history for additional risk factors.   Cardiac Risk Factors include: advanced age (>71men, >50 women);hypertension     Objective:     Vitals: There were no vitals taken for this visit.  There is no height or weight on file to calculate BMI.  Advanced Directives 02/07/2019 12/16/2017 10/04/2017 09/10/2017 09/10/2017 07/06/2017 06/23/2017  Does Patient Have a Medical Advance Directive? Yes No Yes Yes Yes Yes No  Type of Advance Directive Out of facility DNR (pink MOST or yellow form);[place];Living will - [place];Living will Living will [place];Living will [place];Living will -  Does patient want to make changes to medical advance directive? No - Patient declined - - No - Patient declined - - -  Copy of [place] in Chart? No - copy requested - - No - copy requested No - copy requested Yes -  Would patient like information on creating a medical advance directive? - No - Patient declined - - - - -    Tobacco Social History   Tobacco Use  Smoking Status Former Smoker  . Packs/day: 1.00  . Years: 40.00  . Pack years: 40.00  . Types: Cigarettes  . Quit date: 07/21/1995  . Years since quitting: 23.5  Smokeless Tobacco Never Used     Counseling given: Not Answered   Clinical Intake:  Pre-visit preparation completed: Yes        Diabetes: No  How often do you need to have someone help you when you read instructions, pamphlets, or other written materials from your doctor or pharmacy?: 1 - Never  Interpreter Needed?: No     Past Medical History:  Diagnosis Date  . (HFpEF) heart failure with preserved ejection fraction ([place])    a. 08/2017 Echo: EF 55-60%, no rwma, mild MR, mildly dil LA, nl RV fxn.  . Breast cancer ([place]) 2001   left breast  . Cancer ([place]) 2001   left breast ca  . Carotid arterial disease ([place])    a. 10/2004 s/p L CEA; b. 12/2015 Carotid U/S: RICA 1-39%; b. LICA patent CEA site.  Marland Kitchen GERD (gastroesophageal reflux disease)   . Hyperlipidemia   . Hypertension   . Osteoarthritis, multiple sites   . Osteopenia   . Persistent atrial fibrillation    a. Dx 08/2017; b. CHA2DS2VASc = 6-->Xarelto; c. 09/2017 Successful DCCV (second shock - 200J);   Marland Kitchen Personal history of radiation therapy 2001   left breast ca  . Psoriasis    Past Surgical History:  Procedure Laterality Date  . ABDOMINAL HYSTERECTOMY    . ANKLE FRACTURE SURGERY  4/08   left---hardware still in place  . 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 BREAST EXCISIONAL BIOPSY Left yrs ago   benign  . BREAST LUMPECTOMY Left 2001   f/u radiation  . CARDIOVERSION N/A 10/04/2017   Procedure: CARDIOVERSION;  Surgeon: Wellington Hampshire, MD;  Location: [facility] ORS;  Service: Cardiovascular;  Laterality: N/A;  . CARDIOVERSION N/A 12/16/2017   Procedure: CARDIOVERSION;  Surgeon: [name], MD;  Location: [facility] ORS;  Service: Cardiovascular;  Laterality: N/A;  . CAROTID ENDARTERECTOMY Left 10/23/2004  . FRACTURE SURGERY    . OOPHORECTOMY    .  SHOULDER SURGERY  6/07   left  . TONSILLECTOMY AND ADENOIDECTOMY    . TOTAL HIP ARTHROPLASTY  2004   right   Family History  Problem Relation Age of Onset  . Hypertension Brother   . Pneumonia Mother   . Leukemia Father   . Heart disease Neg Hx   . Diabetes Neg Hx    Social History   Socioeconomic History  . Marital status: Widowed    Spouse name: Not on file  . Number of children: Not on file  . Years of education: Not on file  . Highest education level: Not on file  Occupational History  . 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 Financial resource strain: Not hard at all  .  Food insecurity    Worry: Never true    Inability: Never true  . Transportation needs    Medical: No    Non-medical: No  Tobacco Use  . Smoking status: Former Smoker    Packs/day: 1.00    Years: 40.00    Pack years: 40.00    Types: Cigarettes    Quit date: 07/21/1995    Years since quitting: 23.5  . Smokeless tobacco: Never Used  Substance and Sexual Activity  . Alcohol use: Yes    Alcohol/week: 16.0 standard drinks    Types: 2 Glasses of wine, 14 Standard drinks or equivalent per week    Comment: daily  . Drug use: No  . Sexual activity: Never  Lifestyle  . Physical activity    Days per week: 0 days    Minutes per session: Not on file  . Stress: Not at all  Relationships  . Social Herbalist on phone: Not on file    Gets together: Not on file    Attends religious service: Not on file    Active member of club or organization: Not on file    Attends meetings of clubs or organizations: Not on file    Relationship status: Not on file  Other Topics Concern  . Not on file  Social History Narrative   1 natural child   3 adopted children   Artist---still teaches water colors   Husband has Alzheimers      Has living will   Daughter Amy is health care POA   DNR    No tube feeds if cognitively unaware    Outpatient Encounter Medications as of 02/07/2019  Medication Sig  . Cholecalciferol (VITAMIN D3) 1000 units CAPS Take 1 capsule by mouth 2 (two) times daily.   . dabigatran (PRADAXA) 150 MG CAPS capsule Take 1 capsule (150 mg total) by mouth 2 (two) times daily.  . flecainide (TAMBOCOR) 50 MG tablet Take 1 tablet (50 mg total) by mouth 2 (two) times daily.  . furosemide (LASIX) 40 MG tablet TAKE 1 TABLET BY MOUTH EVERY DAY  . LORazepam (ATIVAN) 0.5 MG tablet TAKE 1 TABLET BY MOUTH TWICE A DAY AS NEEDED FOR ANXIETY  . metoprolol tartrate (LOPRESSOR) 50 MG tablet Take 1/2 tablet (25 mg) by mouth twice daily  . mirtazapine (REMERON) 15 MG tablet TAKE 1 TABLET (15  MG TOTAL) BY MOUTH AT BEDTIME.  . Multiple Vitamin (MULTIVITAMIN) capsule Take 1 capsule by mouth daily.    . Multiple Vitamins-Minerals (PRESERVISION AREDS 2) CAPS Take 1 tablet by mouth 2 (two) times daily.   No facility-administered encounter medications on file as of 02/07/2019.     Activities of Daily Living  In your present state of health, do you have any difficulty performing the following activities: 02/07/2019  Hearing? N  Vision? N  Difficulty concentrating or making decisions? N  Walking or climbing stairs? N  Dressing or bathing? N  Doing errands, shopping? N  Preparing Food and eating ? N  Using the Toilet? N  In the past six months, have you accidently leaked urine? N  Do you have problems with loss of bowel control? N  Managing your Medications? N  Managing your Finances? N  Housekeeping or managing your Housekeeping? N  Some recent data might be hidden    Patient Care Team: [name], MD as PCP - General (Family Medicine) [name], MD as PCP - Cardiology (Cardiology)    Assessment:   This is a routine wellness examination for [place].  I connected with patient 02/08/19 at  8:30 AM EDT by a video/audio enabled telemedicine application and verified that I am speaking with the correct person using two identifiers. Patient stated full name and DOB. Patient gave permission to continue with virtual visit. Patient's location was at home and Nurse's location was at [place] office.   Awaiting rheumatoid referral.  Health Screenings  Mammogram - scheduled today Colonoscopy - 03/2009 Bone Density - 07/2011 Glaucoma -none Hearing -demonstrates normal hearing during visit. Labs followed by pcp Cholesterol - 12/2018 Dental- UTD Vision- visits within the last 12 months.  Social  Alcohol intake - yes      Smoking history- former   Smokers in home? none Illicit drug use? none Exercise - no routine. Encouraged stretching and chair/standing exercises  Diet - Regular Sexually Active -never BMI- discussed the importance of a healthy diet, water intake and the benefits of aerobic exercise.  Educational material provided.   Safety  Patient feels safe at home- yes Patient does have smoke detectors at home- yes Patient does wear sunscreen or protective clothing when in direct sunlight -yes Patient does wear seat belt when in a moving vehicle -yes  Covid-19 precautions and sickness symptoms discussed.   Activities of Daily Living Patient denies needing assistance with: driving, household chores, feeding themselves, getting from bed to chair, getting to the toilet, bathing/showering, dressing, managing money, or preparing meals.  No new identified risk were noted.    Depression Screen Patient denies losing interest in daily life, feeling hopeless, or crying easily over simple problems.   Medication-taking as directed and without issues.   Fall Screen Patient denies being afraid of falling or falling in the last year.   Memory Screen Patient is alert.  Patient denies difficulty focusing, concentrating or misplacing items. Correctly identified the president of the [place], season and recall. Patient likes to read and complete art for brain stimulation.  Immunizations The following Immunizations were discussed: Influenza, shingles, pneumonia, and tetanus.   I48.91 Unspecified atrial fib- followed by Dr. Caryl Comes F32.0 Major depressive disorder- followed by pcp  Other Providers Patient Care Team: [name], MD as PCP - General (Family Medicine) [name], MD as PCP - Cardiology (Cardiology)  Exercise Activities and Dietary recommendations Current Exercise Habits: Home exercise routine  Goals      Patient Stated   . Increase physical activity (pt-stated)     Resume silver sneaker exercise when able       Fall Risk Fall Risk  02/07/2019 07/08/2018 12/31/2017 09/29/2017 09/21/2016  Falls in the past year? 0 0 No No Yes   Number falls in past yr: - 0 - -  1  Injury with Fall? - 0 - - No   Is the patient's home free of loose throw rugs in walkways, pet beds, electrical cords, etc? yes        Grab bars in the bathroom? yes      Handrails on the stairs?yes         Adequate lighting?  yes  Depression Screen PHQ 2/9 Scores 02/07/2019 07/08/2018 07/08/2018 11/03/2016  PHQ - 2 Score 0 3 3 3   PHQ- 9 Score - 6 - 13     Cognitive Function MMSE - Mini Mental State Exam 09/12/2015  Orientation to time 5  Orientation to Place 5  Registration 3  Attention/ Calculation 5  Recall 3  Language- name 2 objects 2  Language- repeat 1  Language- follow 3 step command 3  Language- read & follow direction 1  Write a sentence 1  Copy design 1  Total score 30     6CIT Screen 02/07/2019 09/11/2016  What Year? 0 points 0 points  What month? 0 points -  What time? 0 points 0 points  Count back from 20 0 points 0 points  Months in reverse 0 points 0 points  Repeat phrase 0 points 0 points  Total Score 0 -    Immunization History  Administered Date(s) Administered  . Influenza Split 05/20/2011, 05/10/2013  . Influenza, High Dose Seasonal PF 05/10/2015  . Influenza-Unspecified 05/11/2014, 04/19/2016, 04/24/2017  . Pneumococcal Conjugate-13 12/13/2013  . Pneumococcal Polysaccharide-23 06/19/2009  . Tdap 03/08/2011   Screening Tests Health Maintenance  Topic Date Due  . INFLUENZA VACCINE  02/18/2019  . MAMMOGRAM  02/07/2020  . TETANUS/TDAP  03/07/2021  . DEXA SCAN  Completed  . PNA vac Low Risk Adult  Completed      Plan:    End of life planning; Advance aging; Advanced directives discussed.  Copy of current HCPOA/Living Will requested.    I have personally reviewed and noted the following in the patient's chart:   . Medical and social history . Use of alcohol, tobacco or illicit drugs  . Current medications and supplements . Functional ability and status . Nutritional status . Physical activity .  Advanced directives . List of other physicians . Hospitalizations, surgeries, and ER visits in previous 12 months . Vitals . Screenings to include cognitive, depression, and falls . Referrals and appointments  In addition, I have reviewed and discussed with patient certain preventive protocols, quality metrics, and best practice recommendations. A written personalized care plan for preventive services as well as general preventive health recommendations were provided to patient.     Varney Biles, LPN  7/41/2878

## 2019-02-07 NOTE — Patient Instructions (Addendum)
  [name] , Thank you for taking time to come for your Medicare Wellness Visit. I appreciate your ongoing commitment to your health goals. Please review the following plan we discussed and let me know if I can assist you in the future.   These are the goals we discussed: Goals      Patient Stated   . Increase physical activity (pt-stated)     Resume silver sneaker exercise when able       This is a list of the screening recommended for you and due dates:  Health Maintenance  Topic Date Due  . Mammogram  11/10/2018  . Flu Shot  02/18/2019  . Tetanus Vaccine  03/07/2021  . DEXA scan (bone density measurement)  Completed  . Pneumonia vaccines  Completed

## 2019-02-13 NOTE — Progress Notes (Signed)
I have reviewed the above note and agree.  Shizuko Wojdyla, M.D.  

## 2019-02-14 ENCOUNTER — Other Ambulatory Visit: Payer: Self-pay | Admitting: Family Medicine

## 2019-03-01 ENCOUNTER — Telehealth: Payer: Self-pay | Admitting: Family Medicine

## 2019-03-01 ENCOUNTER — Other Ambulatory Visit: Payer: Self-pay | Admitting: Family Medicine

## 2019-03-01 ENCOUNTER — Ambulatory Visit: Payer: Medicare Other

## 2019-03-01 DIAGNOSIS — M7989 Other specified soft tissue disorders: Secondary | ICD-10-CM | POA: Diagnosis not present

## 2019-03-01 DIAGNOSIS — S8012XA Contusion of left lower leg, initial encounter: Secondary | ICD-10-CM | POA: Diagnosis not present

## 2019-03-01 NOTE — Telephone Encounter (Signed)
Last week dishwasher door came down hit patient I her left  leg, patient has bruising down leg and says it is painful to walk on feels like electrical shock running down leg, patient has HX of hip replacement in on this side. Patient says after a week the calf area of legs iss till swollen and very bruised and painful to walk on. Ask patient to sit and place both feet on floor had her do the homan's maneuver by pointing and retracting the foot on extension patient had minimal discomfort but retracting the foot to ward the knee patient found this to be very painful. Advised patient she could not wait she needs to be evaluated , she should go to [facility] where she could be evaluated this morning and if [place] or X-ray needed she would be there at the hospital. Patient agreed she would go to [place] walk in. Patient hasHX of A-Fib but is on Pradaxa .

## 2019-03-01 NOTE — Telephone Encounter (Signed)
Noted. Agree with evaluation. Please follow-up with the patient to ensure she went to get evaluated.

## 2019-03-02 ENCOUNTER — Other Ambulatory Visit: Payer: Self-pay

## 2019-03-02 ENCOUNTER — Ambulatory Visit
Admission: RE | Admit: 2019-03-02 | Discharge: 2019-03-02 | Disposition: A | Discharge status: Home / Self Care | Payer: Medicare Other | Source: Ambulatory Visit | Attending: Family Medicine | Admitting: Family Medicine

## 2019-03-02 DIAGNOSIS — M7989 Other specified soft tissue disorders: Secondary | ICD-10-CM | POA: Diagnosis not present

## 2019-03-03 ENCOUNTER — Ambulatory Visit: Payer: Medicare Other | Admitting: Family Medicine

## 2019-03-09 ENCOUNTER — Other Ambulatory Visit: Payer: Self-pay

## 2019-03-09 ENCOUNTER — Ambulatory Visit (INDEPENDENT_AMBULATORY_CARE_PROVIDER_SITE_OTHER): Payer: Medicare Other | Admitting: Podiatry

## 2019-03-09 ENCOUNTER — Encounter: Payer: Self-pay | Admitting: Podiatry

## 2019-03-09 DIAGNOSIS — M79676 Pain in unspecified toe(s): Secondary | ICD-10-CM | POA: Diagnosis not present

## 2019-03-09 DIAGNOSIS — B351 Tinea unguium: Secondary | ICD-10-CM | POA: Diagnosis not present

## 2019-03-09 DIAGNOSIS — L84 Corns and callosities: Secondary | ICD-10-CM | POA: Insufficient documentation

## 2019-03-09 NOTE — Progress Notes (Signed)
Complaint:  Visit Type: Patient returns to my office for continued preventative foot care services. Complaint: Patient states" my nails have grown long and thick and become painful to walk and wear shoes"  The patient presents for preventative foot care services. No changes to ROS.  Patient requests pads for left foot.  Podiatric Exam: Vascular: dorsalis pedis and posterior tibial pulses are palpable bilateral. Capillary return is immediate. Temperature gradient is WNL. Skin turgor WNL  Sensorium: Normal Semmes Weinstein monofilament test. Normal tactile sensation bilaterally. Nail Exam: Pt has thick disfigured discolored nails with subungual debris noted bilateral entire nail hallux through fifth toenails Ulcer Exam: There is no evidence of ulcer or pre-ulcerative changes or infection. Orthopedic Exam: Muscle tone and strength are WNL. No limitations in general ROM. No crepitus or effusions noted. Foot type and digits show no abnormalities. HAV  B/L. Mallet toe 3 left. Skin: No Porokeratosis. No infection or ulcers.    Diagnosis:  Onychomycosis, , Pain in right toe, pain in left toes  Treatment & Plan Procedures and Treatment: Consent by patient was obtained for treatment procedures.   Debridement of mycotic and hypertrophic toenails, 1 through 5 bilateral and clearing of subungual debris. No ulceration, no infection noted.  Crest pads dispensed for mallet toe left foot. Return Visit-Office Procedure: Patient instructed to return to the office for a follow up visit 3 months for continued evaluation and treatment.    Gardiner Barefoot DPM

## 2019-03-14 DIAGNOSIS — M7138 Other bursal cyst, other site: Secondary | ICD-10-CM | POA: Diagnosis not present

## 2019-03-14 DIAGNOSIS — L400 Psoriasis vulgaris: Secondary | ICD-10-CM | POA: Diagnosis not present

## 2019-03-14 DIAGNOSIS — M1990 Unspecified osteoarthritis, unspecified site: Secondary | ICD-10-CM | POA: Diagnosis not present

## 2019-03-14 DIAGNOSIS — L871 Reactive perforating collagenosis: Secondary | ICD-10-CM | POA: Diagnosis not present

## 2019-03-20 DIAGNOSIS — D485 Neoplasm of uncertain behavior of skin: Secondary | ICD-10-CM | POA: Diagnosis not present

## 2019-03-20 DIAGNOSIS — L859 Epidermal thickening, unspecified: Secondary | ICD-10-CM | POA: Diagnosis not present

## 2019-03-22 DIAGNOSIS — M158 Other polyosteoarthritis: Secondary | ICD-10-CM | POA: Diagnosis not present

## 2019-03-22 DIAGNOSIS — M2012 Hallux valgus (acquired), left foot: Secondary | ICD-10-CM | POA: Diagnosis not present

## 2019-03-22 DIAGNOSIS — R2233 Localized swelling, mass and lump, upper limb, bilateral: Secondary | ICD-10-CM | POA: Diagnosis not present

## 2019-03-22 DIAGNOSIS — M19071 Primary osteoarthritis, right ankle and foot: Secondary | ICD-10-CM | POA: Diagnosis not present

## 2019-03-22 DIAGNOSIS — L4050 Arthropathic psoriasis, unspecified: Secondary | ICD-10-CM | POA: Diagnosis not present

## 2019-03-22 DIAGNOSIS — L409 Psoriasis, unspecified: Secondary | ICD-10-CM | POA: Diagnosis not present

## 2019-03-22 DIAGNOSIS — M19042 Primary osteoarthritis, left hand: Secondary | ICD-10-CM | POA: Diagnosis not present

## 2019-03-31 DIAGNOSIS — R238 Other skin changes: Secondary | ICD-10-CM | POA: Diagnosis not present

## 2019-04-06 DIAGNOSIS — M158 Other polyosteoarthritis: Secondary | ICD-10-CM | POA: Diagnosis not present

## 2019-04-06 DIAGNOSIS — R2233 Localized swelling, mass and lump, upper limb, bilateral: Secondary | ICD-10-CM | POA: Diagnosis not present

## 2019-04-06 DIAGNOSIS — Z79899 Other long term (current) drug therapy: Secondary | ICD-10-CM | POA: Diagnosis not present

## 2019-04-06 DIAGNOSIS — M1A9XX1 Chronic gout, unspecified, with tophus (tophi): Secondary | ICD-10-CM | POA: Diagnosis not present

## 2019-04-07 DIAGNOSIS — Z23 Encounter for immunization: Secondary | ICD-10-CM | POA: Diagnosis not present

## 2019-04-08 ENCOUNTER — Other Ambulatory Visit: Payer: Self-pay | Admitting: Family Medicine

## 2019-04-08 ENCOUNTER — Other Ambulatory Visit: Payer: Self-pay | Admitting: Internal Medicine

## 2019-04-11 NOTE — Telephone Encounter (Signed)
LVM for patient to schedule f/u

## 2019-04-11 NOTE — Telephone Encounter (Signed)
Please call to schedule F/U with Dr. Caryl Comes. Thank you!

## 2019-04-11 NOTE — Telephone Encounter (Signed)
This is a [place] pt 

## 2019-04-13 NOTE — Telephone Encounter (Signed)
Patient is scheduled with Caryl Comes on 10/1

## 2019-04-20 ENCOUNTER — Ambulatory Visit (INDEPENDENT_AMBULATORY_CARE_PROVIDER_SITE_OTHER): Payer: Medicare Other | Admitting: Internal Medicine

## 2019-04-20 ENCOUNTER — Other Ambulatory Visit: Payer: Self-pay

## 2019-04-20 ENCOUNTER — Encounter: Payer: Self-pay | Admitting: Internal Medicine

## 2019-04-20 VITALS — BP 142/88 | HR 75 | Ht 66.5 in | Wt 121.0 lb

## 2019-04-20 DIAGNOSIS — I6523 Occlusion and stenosis of bilateral carotid arteries: Secondary | ICD-10-CM

## 2019-04-20 DIAGNOSIS — R001 Bradycardia, unspecified: Secondary | ICD-10-CM | POA: Diagnosis not present

## 2019-04-20 DIAGNOSIS — I4819 Other persistent atrial fibrillation: Secondary | ICD-10-CM | POA: Diagnosis not present

## 2019-04-20 NOTE — Progress Notes (Signed)
Patient Care Team: Dana Haven, MD as PCP - General (Family Medicine) Dana Sprang, MD as PCP - Cardiology (Cardiology)   HPI  [name] is a 78 y.o. female Seen in follow-up for recent hospitalization having presented with acute shortness of breath and found to be in rapid atrial fibrillation.  Rate control was accomplished with beta-blockers and calcium blockers and anticoagulation with Xarelto.  She has had recurrent atrial fib with RVR  UnderwentDCCV with rapid reversion  Started flecainide 5/19  DATE PR interval QRSduration Dose-Flec  5/19  NA 78 0  6/19 210 84 50       9/19 210 90 50  10/20 216 92 50  \] No interval atrial fibrillation of which she is aware.  No bleeding.  Exercising.  Arthritis is been very problematic. No chest pain or shortness of breath.   DATE TEST EF   2/19 Echo   65 % LAE (48/2.8/46)         Date Cr K Hgb  2/19 0.8 3.9 12.5  5/19  1.12 3.8   6/20 0.97 4.4 0000000    Thromboembolic risk factors ( age  -2, HTN-1, Vasc disease -1, Gender-1) for a CHADSVASc Score of 5  Records and Results Reviewed hosp records     Struggled with Fathers day; anniversary came and went was not too bad and her birthday which her husband had been unable to remember for years was celebrated with great enthusiasm by friends and family  She remains unable to do what she would like to do.  There is fatigue.  This is predominant.  Occasional dyspnea.  Trace edema.  Also some arthritis.  She is [name] his wife.  Son, [name], is an Pharmacologist. Past Medical History:  Diagnosis Date  . (HFpEF) heart failure with preserved ejection fraction ([place])    a. 08/2017 Echo: EF 55-60%, no rwma, mild MR, mildly dil LA, nl RV fxn.  . Breast cancer ([place]) 2001   left breast  . Cancer ([place]) 2001   left breast ca  . Carotid arterial disease ([place])    a. 10/2004 s/p L CEA; b. 12/2015 Carotid U/S: RICA 1-39%; b. LICA patent CEA site.  Marland Kitchen GERD  (gastroesophageal reflux disease)   . Hyperlipidemia   . Hypertension   . Osteoarthritis, multiple sites   . Osteopenia   . Persistent atrial fibrillation ([place])    a. Dx 08/2017; b. CHA2DS2VASc = 6-->Xarelto; c. 09/2017 Successful DCCV (second shock - 200J);   Marland Kitchen Personal history of radiation therapy 2001   left breast ca  . Psoriasis     Past Surgical History:  Procedure Laterality Date  . ABDOMINAL HYSTERECTOMY    . ANKLE FRACTURE SURGERY  4/08   left---hardware still in place  . 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 BREAST EXCISIONAL BIOPSY Left yrs ago   benign  . BREAST LUMPECTOMY Left 2001   f/u radiation  . CARDIOVERSION N/A 10/04/2017   Procedure: CARDIOVERSION;  Surgeon: Wellington Hampshire, MD;  Location: [facility] ORS;  Service: Cardiovascular;  Laterality: N/A;  . CARDIOVERSION N/A 12/16/2017   Procedure: CARDIOVERSION;  Surgeon: Dana Sprang, MD;  Location: [facility] ORS;  Service: Cardiovascular;  Laterality: N/A;  . CAROTID ENDARTERECTOMY Left 10/23/2004  . FRACTURE SURGERY    . OOPHORECTOMY    . SHOULDER SURGERY  6/07   left  . TONSILLECTOMY AND ADENOIDECTOMY    . TOTAL HIP ARTHROPLASTY  2004   right    Current Outpatient Medications  Medication Sig Dispense Refill  . allopurinol (ZYLOPRIM) 100 MG tablet Take 100 mg by mouth daily.    . Cholecalciferol (VITAMIN D3) 1000 units CAPS Take 1 capsule by mouth 2 (two) times daily.     . dabigatran (PRADAXA) 150 MG CAPS capsule Take 1 capsule (150 mg total) by mouth 2 (two) times daily. 180 capsule 3  . flecainide (TAMBOCOR) 50 MG tablet TAKE 1 TABLET BY MOUTH TWICE A DAY 60 tablet 0  . furosemide (LASIX) 40 MG tablet TAKE 1 TABLET BY MOUTH EVERY DAY 90 tablet 1  . LORazepam (ATIVAN) 0.5 MG tablet TAKE 1 TABLET BY MOUTH TWICE A DAY AS NEEDED FOR ANXIETY 60 tablet 0  . metoprolol tartrate (LOPRESSOR) 50 MG tablet Take 1/2 tablet (25 mg) by mouth twice daily 90 tablet 0  . mirtazapine (REMERON) 15 MG tablet TAKE 1 TABLET (15 MG  TOTAL) BY MOUTH AT BEDTIME. 90 tablet 3  . Multiple Vitamin (MULTIVITAMIN) capsule Take 1 capsule by mouth daily.      . Multiple Vitamins-Minerals (PRESERVISION AREDS 2) CAPS Take 1 tablet by mouth 2 (two) times daily.     No current facility-administered medications for this visit.     No Known Allergies    Review of Systems negative except from HPI and PMH  Physical Exam BP (!) 142/88 (BP Location: Left Arm, Patient Position: Sitting, Cuff Size: Normal)   Pulse 75   Ht 5' 6.5" (1.689 m)   Wt 121 lb (54.9 kg)   SpO2 99%   BMI 19.24 kg/m  Well developed and nourished in no acute distress HENT normal Neck supple with JVP-  flat   Clear Regular rate and rhythm, no murmurs or gallops Abd-soft with active BS No Clubbing cyanosis edema Skin-warm and dry A & Oriented  Grossly normal sensory and motor function  ECG sinus @ 75 22/09/42  Assessment and  Plan  Atrial fibrillation-persistent with a rapid rate  Sinus brady  Grief  Peripheral vascular disease carotid artery occlusion    No interval palpitations.  Tolerating flecainide.  On Anticoagulation;  No bleeding issues   Husband has been gone now a year and a half.  Today his his birthday.  She is reading again and painting again.

## 2019-04-20 NOTE — Patient Instructions (Signed)
Medication Instructions:  - Your physician recommends that you continue on your current medications as directed. Please refer to the Current Medication list given to you today.  If you need a refill on your cardiac medications before your next appointment, please call your pharmacy.   Lab work: - none ordered  If you have labs (blood work) drawn today and your tests are completely normal, you will receive your results only by: Marland Kitchen MyChart Message (if you have MyChart) OR . A paper copy in the mail If you have any lab test that is abnormal or we need to change your treatment, we will call you to review the results.  Testing/Procedures: - none ordered  Follow-Up: At [facility], you and your health needs are our priority.  As part of our continuing mission to provide you with exceptional heart care, we have created designated Provider Care Teams.  These Care Teams include your primary Cardiologist (physician) and Advanced Practice Providers (APPs -  Physician Assistants and Nurse Practitioners) who all work together to provide you with the care you need, when you need it.  . You will need a follow up appointment in 6 months (April 2021) with Dr. Caryl Comes. Marland Kitchen Please call our office 2 months in advance to schedule this appointment.  (Call in early February to schedule)  Any Other Special Instructions Will Be Listed Below (If Applicable). - N/A

## 2019-05-12 ENCOUNTER — Telehealth: Payer: Self-pay

## 2019-05-12 MED ORDER — FLECAINIDE ACETATE 50 MG PO TABS: 50.0000 mg | ORAL_TABLET | Freq: Two times a day (BID) | ORAL | 4 refills | Status: DC

## 2019-05-12 NOTE — Telephone Encounter (Signed)
Requested Prescriptions   Signed Prescriptions Disp Refills  . flecainide (TAMBOCOR) 50 MG tablet 60 tablet 4    Sig: Take 1 tablet (50 mg total) by mouth 2 (two) times daily.    Authorizing Provider: [name]    Ordering User: Raelene Bott, Chassidy Layson L

## 2019-05-15 ENCOUNTER — Other Ambulatory Visit: Payer: Self-pay

## 2019-05-17 ENCOUNTER — Other Ambulatory Visit: Payer: Self-pay

## 2019-05-17 ENCOUNTER — Encounter: Payer: Self-pay | Admitting: Family Medicine

## 2019-05-17 ENCOUNTER — Ambulatory Visit (INDEPENDENT_AMBULATORY_CARE_PROVIDER_SITE_OTHER): Payer: Medicare Other | Admitting: Family Medicine

## 2019-05-17 VITALS — BP 140/80 | HR 57 | Temp 97.0°F | Ht 66.0 in | Wt 121.0 lb

## 2019-05-17 DIAGNOSIS — G8929 Other chronic pain: Secondary | ICD-10-CM | POA: Diagnosis not present

## 2019-05-17 DIAGNOSIS — L4050 Arthropathic psoriasis, unspecified: Secondary | ICD-10-CM

## 2019-05-17 DIAGNOSIS — W19XXXA Unspecified fall, initial encounter: Secondary | ICD-10-CM | POA: Diagnosis not present

## 2019-05-17 DIAGNOSIS — M1A0491 Idiopathic chronic gout, unspecified hand, with tophus (tophi): Secondary | ICD-10-CM

## 2019-05-17 DIAGNOSIS — I6523 Occlusion and stenosis of bilateral carotid arteries: Secondary | ICD-10-CM

## 2019-05-17 DIAGNOSIS — M25511 Pain in right shoulder: Secondary | ICD-10-CM

## 2019-05-17 MED ORDER — TRAMADOL HCL 50 MG PO TABS: 50.0000 mg | ORAL_TABLET | Freq: Three times a day (TID) | ORAL | 0 refills | Status: DC | PRN

## 2019-05-17 NOTE — Progress Notes (Signed)
[name], MD Phone: [phone]  [name] is a 78 y.o. female who presents today for f/u.  Anxiety/depression: Patient notes these are well controlled.  Taking Remeron.  No significant anxiety or depression.  Rarely takes the Ativan.  No drowsiness with this.  Fall: Patient reports she had a fall 3 to 4 days ago.  She was reaching out with her right arm and her right shoulder started to bother her so she reached out with her left arm and then fell forward.  She landed on her torso.  She notes bruising.  She notes some spasms in her chest after this.  She notes it hurts in her ribs on the right side when she takes a deep breath.  She does note the pain improves as the day goes on.  No shortness of breath.  She did note some bruising on her right ear though she notes she did not strike her skull. No loss of consciousness.  No headache, vision changes, numbness, or weakness.  Denies history of seizures.  Psoriatic arthritis/gout: Patient saw rheumatology.  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  She is started on allopurinol.  She has labs through rheumatology tomorrow.  Right shoulder pain: Patient notes previously doing physical therapy and that did help with this.  Started hurting somewhat recently again.  She has trouble lifting it laterally due to pain and decreased range of motion.  Social History   Tobacco Use  Smoking Status Former Smoker  . Packs/day: 1.00  . Years: 40.00  . Pack years: 40.00  . Types: Cigarettes  . Quit date: 07/21/1995  . Years since quitting: 23.8  Smokeless Tobacco Never Used     ROS see history of present illness  Objective  Physical Exam Vitals:   05/17/19 0911  BP: 140/80  Pulse: (!) 57  Temp: (!) 97 F (36.1 C)  SpO2: 99%    BP Readings from Last 3 Encounters:  05/17/19 140/80  04/20/19 (!) 142/88  01/25/19 (!) 142/96   Wt Readings from Last 3 Encounters:  05/17/19 121 lb (54.9 kg)  04/20/19 121 lb (54.9 kg)  01/25/19  125 lb 12.8 oz (57.1 kg)    Physical Exam Constitutional:      General: She is not in acute distress.    Appearance: She is not diaphoretic.  HENT:     Head: No raccoon eyes or Battle's sign.     Comments: Minimal bruising of her right ear    Right Ear: Tympanic membrane and ear canal normal. No hemotympanum.     Left Ear: Tympanic membrane and ear canal normal. No hemotympanum.  Cardiovascular:     Rate and Rhythm: Normal rate and regular rhythm.     Heart sounds: Normal heart sounds.  Pulmonary:     Effort: Pulmonary effort is normal.     Breath sounds: Normal breath sounds.  Musculoskeletal:       Arms:     Comments: Bilateral shoulders with no tenderness, right shoulder with full passive range of motion with pain in abduction and internal rotation, left shoulder with full passive range of motion with no pain  Skin:    General: Skin is warm and dry.  Neurological:     Mental Status: She is alert.     Comments: CN 2-12 intact, 5/5 strength in bilateral biceps, triceps, grip, quads, hamstrings, plantar and dorsiflexion, sensation to light touch intact in bilateral UE and LE, normal gait, 2+ patellar reflexes  Assessment/Plan: Please see individual problem list.  Psoriatic arthritis She will continue to see her rheumatologist.  Gout She will continue allopurinol through rheumatology.  Fall Patient with recent fall related to losing balance.  She does have bruising and tenderness over her left ribs.  I discussed x-ray imaging though she defers this.  Advised that if she develops worsening pain or trouble breathing or fevers or other new symptoms she should be reevaluated for imaging.  He does have bruising on her ear.  We did discuss CT imaging of her head though we jointly decided to defer this given that she is neurologically intact.  I did discuss that if she developed any headaches or neurological symptoms she needs to be evaluated for imaging.  Will refer for physical  therapy.  Written prescription for a walker given.  Right shoulder pain Chronic issue that is recurred recently.  Refer for physical therapy.  Consider follow-up with orthopedics if not improving.   Orders Placed This Encounter  Procedures  . Ambulatory referral to Physical Therapy    Referral Priority:   Routine    Referral Type:   Physical Medicine    Referral Reason:   Specialty Services Required    Requested Specialty:   Physical Therapy    Number of Visits Requested:   1    Meds ordered this encounter  Medications  . traMADol (ULTRAM) 50 MG tablet    Sig: Take 1 tablet (50 mg total) by mouth every 8 (eight) hours as needed for up to 5 days.    Dispense:  15 tablet    Refill:  0     [name], MD [place]

## 2019-05-17 NOTE — Patient Instructions (Signed)
Nice to see you. Please monitor your pain.  If it is worsening, or you develop shortness of breath, cough, fever, or new symptoms please be reevaluated. We will get you set up with physical therapy.

## 2019-05-18 DIAGNOSIS — M109 Gout, unspecified: Secondary | ICD-10-CM | POA: Insufficient documentation

## 2019-05-18 NOTE — Assessment & Plan Note (Signed)
Patient with recent fall related to losing balance.  She does have bruising and tenderness over her left ribs.  I discussed x-ray imaging though she defers this.  Advised that if she develops worsening pain or trouble breathing or fevers or other new symptoms she should be reevaluated for imaging.  He does have bruising on her ear.  We did discuss CT imaging of her head though we jointly decided to defer this given that she is neurologically intact.  I did discuss that if she developed any headaches or neurological symptoms she needs to be evaluated for imaging.  Will refer for physical therapy.  Written prescription for a walker given.

## 2019-05-18 NOTE — Assessment & Plan Note (Signed)
Chronic issue that is recurred recently.  Refer for physical therapy.  Consider follow-up with orthopedics if not improving.

## 2019-05-18 NOTE — Assessment & Plan Note (Signed)
She will continue to see her rheumatologist. 

## 2019-05-18 NOTE — Assessment & Plan Note (Signed)
She will continue allopurinol through rheumatology.

## 2019-05-22 ENCOUNTER — Other Ambulatory Visit: Payer: Self-pay | Admitting: Internal Medicine

## 2019-05-22 ENCOUNTER — Telehealth: Payer: Self-pay | Admitting: Family Medicine

## 2019-05-22 NOTE — Telephone Encounter (Signed)
traMADol (ULTRAM) 50 MG tablet   Patient does not feel this medication is not working. Patient is still experiencing pain. Patient inquired if she is able to try a different medication. Patient is requesting call back from CMA to discuss.

## 2019-05-23 DIAGNOSIS — Z79899 Other long term (current) drug therapy: Secondary | ICD-10-CM | POA: Diagnosis not present

## 2019-05-23 DIAGNOSIS — M1A9XX1 Chronic gout, unspecified, with tophus (tophi): Secondary | ICD-10-CM | POA: Diagnosis not present

## 2019-05-23 NOTE — Telephone Encounter (Signed)
I called and spoke with the patient and she states she would be ok trying the 100 mg of tramadol and you can send it to [facility] on [address].  She also stated the is willing to do PT for her hand she just needs it scheduled.  Nina,cma

## 2019-05-23 NOTE — Telephone Encounter (Signed)
We could try increasing the tramadol to 100 mg by mouth every 8 hours as needed. She needs to do PT as that will be beneficial. I would advise that she see orthopedics as well as the pain continues.

## 2019-05-23 NOTE — Telephone Encounter (Signed)
The medication Tramadol  is not working. Patient is still experiencing pain. Patient inquired if she is able to try a different medication.  Trace Cederberg,cma

## 2019-05-24 ENCOUNTER — Other Ambulatory Visit: Payer: Self-pay | Admitting: Family Medicine

## 2019-05-24 MED ORDER — TRAMADOL HCL 50 MG PO TABS: 100.0000 mg | ORAL_TABLET | Freq: Three times a day (TID) | ORAL | 0 refills | Status: AC | PRN

## 2019-05-24 NOTE — Telephone Encounter (Signed)
Patient calling back about request. She states she needs this medication today, if possible.

## 2019-05-24 NOTE — Telephone Encounter (Signed)
Patient calling back about request. She states she needs this medication today, if possible

## 2019-05-24 NOTE — Telephone Encounter (Signed)
Sent to pharmacy 

## 2019-05-24 NOTE — Telephone Encounter (Signed)
I called and left a voicemail informing the patient that her medication was sent to the pharmacy.  Nina,cma

## 2019-05-25 ENCOUNTER — Telehealth: Payer: Self-pay

## 2019-05-25 MED ORDER — METOPROLOL TARTRATE 50 MG PO TABS: ORAL_TABLET | ORAL | 1 refills | Status: DC

## 2019-05-25 NOTE — Telephone Encounter (Signed)
Requested Prescriptions   Signed Prescriptions Disp Refills  . metoprolol tartrate (LOPRESSOR) 50 MG tablet 90 tablet 1    Sig: Take 1/2 tablet (25 mg) by mouth twice daily    Authorizing Provider: [name]    Ordering User: Raelene Bott, Elberta Lachapelle L

## 2019-05-30 ENCOUNTER — Other Ambulatory Visit: Payer: Self-pay | Admitting: *Deleted

## 2019-05-30 ENCOUNTER — Other Ambulatory Visit: Payer: Self-pay | Admitting: Internal Medicine

## 2019-05-30 MED ORDER — FLECAINIDE ACETATE 50 MG PO TABS: 50.0000 mg | ORAL_TABLET | Freq: Two times a day (BID) | ORAL | 1 refills | Status: DC

## 2019-06-05 DIAGNOSIS — M542 Cervicalgia: Secondary | ICD-10-CM | POA: Diagnosis not present

## 2019-06-05 DIAGNOSIS — M1A9XX1 Chronic gout, unspecified, with tophus (tophi): Secondary | ICD-10-CM | POA: Diagnosis not present

## 2019-06-05 DIAGNOSIS — Z79899 Other long term (current) drug therapy: Secondary | ICD-10-CM | POA: Diagnosis not present

## 2019-06-08 ENCOUNTER — Other Ambulatory Visit: Payer: Self-pay

## 2019-06-08 ENCOUNTER — Encounter: Payer: Self-pay | Admitting: Podiatry

## 2019-06-08 ENCOUNTER — Ambulatory Visit (INDEPENDENT_AMBULATORY_CARE_PROVIDER_SITE_OTHER): Payer: Medicare Other | Admitting: Podiatry

## 2019-06-08 DIAGNOSIS — M79676 Pain in unspecified toe(s): Secondary | ICD-10-CM

## 2019-06-08 DIAGNOSIS — B351 Tinea unguium: Secondary | ICD-10-CM | POA: Diagnosis not present

## 2019-06-08 NOTE — Progress Notes (Signed)
Complaint:  Visit Type: Patient returns to my office for continued preventative foot care services. Complaint: Patient states" my nails have grown long and thick and become painful to walk and wear shoes"  The patient presents for preventative foot care services. No changes to ROS.  Patient requests pads for left foot.  Podiatric Exam: Vascular: dorsalis pedis and posterior tibial pulses are palpable bilateral. Capillary return is immediate. Temperature gradient is WNL. Skin turgor WNL  Sensorium: Normal Semmes Weinstein monofilament test. Normal tactile sensation bilaterally. Nail Exam: Pt has thick disfigured discolored nails with subungual debris noted bilateral entire nail hallux through fifth toenails Ulcer Exam: There is no evidence of ulcer or pre-ulcerative changes or infection. Orthopedic Exam: Muscle tone and strength are WNL. No limitations in general ROM. No crepitus or effusions noted. Foot type and digits show no abnormalities. HAV  B/L. Mallet toe 3 left. Skin: No Porokeratosis. No infection or ulcers.    Diagnosis:  Onychomycosis, , Pain in right toe, pain in left toes  Treatment & Plan Procedures and Treatment: Consent by patient was obtained for treatment procedures.   Debridement of mycotic and hypertrophic toenails, 1 through 5 bilateral and clearing of subungual debris. No ulceration, no infection noted.  Crest pads dispensed for mallet toe left foot. Return Visit-Office Procedure: Patient instructed to return to the office for a follow up visit 3 months for continued evaluation and treatment.    Zian Delair DPM 

## 2019-06-29 ENCOUNTER — Other Ambulatory Visit: Payer: Self-pay | Admitting: Family Medicine

## 2019-07-03 ENCOUNTER — Emergency Department
Admission: EM | Admit: 2019-07-03 | Discharge: 2019-07-03 | Disposition: A | Discharge status: Home / Self Care | Payer: Medicare Other | Attending: Emergency Medicine | Admitting: Emergency Medicine

## 2019-07-03 ENCOUNTER — Emergency Department: Payer: Medicare Other

## 2019-07-03 ENCOUNTER — Telehealth: Payer: Self-pay | Admitting: Family Medicine

## 2019-07-03 ENCOUNTER — Ambulatory Visit: Payer: Self-pay | Admitting: *Deleted

## 2019-07-03 ENCOUNTER — Other Ambulatory Visit: Payer: Self-pay

## 2019-07-03 ENCOUNTER — Encounter: Payer: Self-pay | Admitting: Emergency Medicine

## 2019-07-03 DIAGNOSIS — Z79899 Other long term (current) drug therapy: Secondary | ICD-10-CM | POA: Diagnosis not present

## 2019-07-03 DIAGNOSIS — S72114A Nondisplaced fracture of greater trochanter of right femur, initial encounter for closed fracture: Secondary | ICD-10-CM | POA: Diagnosis not present

## 2019-07-03 DIAGNOSIS — Z87891 Personal history of nicotine dependence: Secondary | ICD-10-CM | POA: Insufficient documentation

## 2019-07-03 DIAGNOSIS — Y9389 Activity, other specified: Secondary | ICD-10-CM | POA: Insufficient documentation

## 2019-07-03 DIAGNOSIS — W010XXA Fall on same level from slipping, tripping and stumbling without subsequent striking against object, initial encounter: Secondary | ICD-10-CM | POA: Diagnosis not present

## 2019-07-03 DIAGNOSIS — S79911A Unspecified injury of right hip, initial encounter: Secondary | ICD-10-CM | POA: Diagnosis present

## 2019-07-03 DIAGNOSIS — Y998 Other external cause status: Secondary | ICD-10-CM | POA: Diagnosis not present

## 2019-07-03 DIAGNOSIS — I10 Essential (primary) hypertension: Secondary | ICD-10-CM | POA: Insufficient documentation

## 2019-07-03 DIAGNOSIS — Y92018 Other place in single-family (private) house as the place of occurrence of the external cause: Secondary | ICD-10-CM | POA: Insufficient documentation

## 2019-07-03 DIAGNOSIS — Z96641 Presence of right artificial hip joint: Secondary | ICD-10-CM | POA: Diagnosis not present

## 2019-07-03 DIAGNOSIS — Z853 Personal history of malignant neoplasm of breast: Secondary | ICD-10-CM | POA: Diagnosis not present

## 2019-07-03 LAB — BASIC METABOLIC PANEL
Anion gap: 14 (ref 5–15)
BUN: 19 mg/dL (ref 8–23)
CO2: 26 mmol/L (ref 22–32)
Calcium: 9.4 mg/dL (ref 8.9–10.3)
Chloride: 96 mmol/L — ABNORMAL LOW (ref 98–111)
Creatinine, Ser: 0.69 mg/dL (ref 0.44–1.00)
GFR calc Af Amer: >60 mL/min (ref >60)
GFR calc non Af Amer: >60 mL/min (ref >60)
Glucose, Bld: 110 mg/dL — ABNORMAL HIGH (ref 70–99)
Potassium: 4.2 mmol/L (ref 3.5–5.1)
Sodium: 136 mmol/L (ref 135–145)

## 2019-07-03 LAB — CBC
HCT: 31.7 % — ABNORMAL LOW (ref 36.0–46.0)
Hemoglobin: 10.6 g/dL — ABNORMAL LOW (ref 12.0–15.0)
MCH: 36.7 pg — ABNORMAL HIGH (ref 26.0–34.0)
MCHC: 33.4 g/dL (ref 30.0–36.0)
MCV: 109.7 fL — ABNORMAL HIGH (ref 80.0–100.0)
Platelets: 316 10*3/uL (ref 150–400)
RBC: 2.89 MIL/uL — ABNORMAL LOW (ref 3.87–5.11)
RDW: 14.6 % (ref 11.5–15.5)
WBC: 11.3 10*3/uL — ABNORMAL HIGH (ref 4.0–10.5)
nRBC: 0 % (ref 0.0–0.2)

## 2019-07-03 MED ORDER — MORPHINE SULFATE (PF) 4 MG/ML IV SOLN
4.0000 mg | Freq: Once | INTRAVENOUS | Status: AC
  Administered 2019-07-03: 4 mg via INTRAVENOUS
  Filled 2019-07-03: qty 1

## 2019-07-03 MED ORDER — ONDANSETRON HCL 4 MG/2ML IJ SOLN
4.0000 mg | Freq: Once | INTRAMUSCULAR | Status: AC
  Administered 2019-07-03: 13:00:00 4 mg via INTRAVENOUS
  Filled 2019-07-03: qty 2

## 2019-07-03 MED ORDER — OXYCODONE-ACETAMINOPHEN 5-325 MG PO TABS: 1.0000 | ORAL_TABLET | Freq: Four times a day (QID) | ORAL | 0 refills | Status: DC | PRN

## 2019-07-03 MED ORDER — OXYCODONE-ACETAMINOPHEN 5-325 MG PO TABS
1.0000 | ORAL_TABLET | Freq: Once | ORAL | Status: DC
  Filled 2019-07-03: qty 1

## 2019-07-03 NOTE — Telephone Encounter (Signed)
Patient daughter ([name]) called Patient fell Saturday night and fracture her hip.  Patient is currently in ER right now and daughter states that ER wants to discharge her.  [name] states she does not want her discharged.  Patient lives by herself.  Daughter states she cannot stay by herself and she does need rehab.     [name] Call back [phone] (Jill)other daughter that is with her

## 2019-07-03 NOTE — ED Triage Notes (Signed)
Pt via ems from home after a fall 2 days ago. She fell while distracted; no loc or head injury. Pt has pain in her right hip, which has been replaced. Pt states she was able to walk on it immediately, but it is now so painful to walk that she "gets woozy and sees stars" due to the pain when she attempts weight bearing. Pt alert & oriented, nad noted.

## 2019-07-03 NOTE — ED Notes (Signed)
Pt discharged home after verbalizing understanding of discharge instructions; nad noted. 

## 2019-07-03 NOTE — Telephone Encounter (Signed)
See phone note per daughter call patient wishes to DC home and not rehab or nursing.

## 2019-07-03 NOTE — ED Provider Notes (Signed)
[facility] Emergency Department Provider Note   ____________________________________________    I have reviewed the triage vital signs and the nursing notes.   HISTORY  Chief Complaint Hip Pain     HPI MARAGRET BOHLKEN is a 78 y.o. female who presents urgent after a fall 2 days ago.  Patient reports that she lost her balance had mechanical fall noted on her right hip.  She has a history of a hip replacement on the right done by Dr. Marry Guan.  She reports pain has worsened over the last 2 days difficulty ambulating this time.  No other injuries.  She is on blood thinners.  Past Medical History:  Diagnosis Date  . (HFpEF) heart failure with preserved ejection fraction ([place])    a. 08/2017 Echo: EF 55-60%, no rwma, mild MR, mildly dil LA, nl RV fxn.  . Breast cancer ([place]) 2001   left breast  . Cancer ([place]) 2001   left breast ca  . Carotid arterial disease ([place])    a. 10/2004 s/p L CEA; b. 12/2015 Carotid U/S: RICA 1-39%; b. LICA patent CEA site.  Marland Kitchen GERD (gastroesophageal reflux disease)   . Hyperlipidemia   . Hypertension   . Osteoarthritis, multiple sites   . Osteopenia   . Persistent atrial fibrillation ([place])    a. Dx 08/2017; b. CHA2DS2VASc = 6-->Xarelto; c. 09/2017 Successful DCCV (second shock - 200J);   Marland Kitchen Personal history of radiation therapy 2001   left breast ca  . Psoriasis     Patient Active Problem List   Diagnosis Date Noted  . Gout 05/18/2019  . [place] 03/09/2019  . Right hip pain 07/08/2018  . Chronic bilateral low back pain without sciatica 12/31/2017  . Atrial fibrillation ([place]) 09/10/2017  . Trapezius strain, right, initial encounter 09/10/2017  . Depression, major, single episode, mild ([place]) 09/10/2017  . Chronic venous insufficiency 09/09/2017  . Lymphedema 09/09/2017  . Pain of toe of left foot 08/17/2017  . Grief 08/17/2017  . Leg swelling 07/31/2017  . Dry eyes 07/06/2017  . Pain and swelling of lower extremity, right  03/29/2017  . Right shoulder pain 03/29/2017  . Diarrhea 05/18/2016  . Fall 05/01/2016  . Unintentional weight loss 12/30/2015  . Leg weakness, bilateral 12/30/2015  . Occasional numbness/prickling/tingling of fingers and toes 12/18/2014  . Macrocytic anemia 05/19/2013  . Psoriatic arthritis ([place]) 02/03/2013  . Right hand pain 08/05/2012  . Psoriasis   . Osteoarthritis, multiple sites   . Episodic mood disorder ([place]) 12/01/2011  . History of breast cancer 10/28/2011  . GERD (gastroesophageal reflux disease)   . Hyperlipidemia   . Hypertension   . Osteopenia   . Carotid stenosis 05/27/2011    Past Surgical History:  Procedure Laterality Date  . ABDOMINAL HYSTERECTOMY    . ANKLE FRACTURE SURGERY  4/08   left---hardware still in place  . 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 BREAST EXCISIONAL BIOPSY Left yrs ago   benign  . BREAST LUMPECTOMY Left 2001   f/u radiation  . CARDIOVERSION N/A 10/04/2017   Procedure: CARDIOVERSION;  Surgeon: Wellington Hampshire, MD;  Location: [facility] ORS;  Service: Cardiovascular;  Laterality: N/A;  . CARDIOVERSION N/A 12/16/2017   Procedure: CARDIOVERSION;  Surgeon: [name], MD;  Location: [facility] ORS;  Service: Cardiovascular;  Laterality: N/A;  . CAROTID ENDARTERECTOMY Left 10/23/2004  . FRACTURE SURGERY    . OOPHORECTOMY    . SHOULDER SURGERY  6/07   left  .  TONSILLECTOMY AND ADENOIDECTOMY    . TOTAL HIP ARTHROPLASTY  2004   right    Prior to Admission medications   Medication Sig Start Date End Date Taking? Authorizing Provider  allopurinol (ZYLOPRIM) 100 MG tablet Take 100 mg by mouth daily. 04/06/19   [provider]  Cholecalciferol (VITAMIN D3) 1000 units CAPS Take 1 capsule by mouth 2 (two) times daily.     [provider]  dabigatran (PRADAXA) 150 MG CAPS capsule Take 1 capsule (150 mg total) by mouth 2 (two) times daily. 02/04/19   Dunn, Nedra Hai, PA-C  flecainide (TAMBOCOR) 50 MG tablet TAKE 1 TABLET BY MOUTH TWICE  A DAY 05/30/19   [name], MD  furosemide (LASIX) 40 MG tablet TAKE 1 TABLET BY MOUTH EVERY DAY 04/11/19   [name], MD  LORazepam (ATIVAN) 0.5 MG tablet TAKE 1 TABLET BY MOUTH TWICE A DAY AS NEEDED FOR ANXIETY 06/29/19   [name], MD  metoprolol tartrate (LOPRESSOR) 50 MG tablet Take 1/2 tablet (25 mg) by mouth twice daily 05/25/19   [name], MD  mirtazapine (REMERON) 15 MG tablet TAKE 1 TABLET (15 MG TOTAL) BY MOUTH AT BEDTIME. 08/16/18   [name], MD  Multiple Vitamin (MULTIVITAMIN) capsule Take 1 capsule by mouth daily.      [provider]  Multiple Vitamins-Minerals (PRESERVISION AREDS 2) CAPS Take 1 tablet by mouth 2 (two) times daily.    [provider]  oxyCODONE-acetaminophen (PERCOCET) 5-325 MG tablet Take 1 tablet by mouth every 6 (six) hours as needed for severe pain. 07/03/19 07/02/20  Lavonia Drafts, MD     Allergies Patient has no known allergies.  Family History  Problem Relation Age of Onset  . Hypertension Brother   . Pneumonia Mother   . Leukemia Father   . Heart disease Neg Hx   . Diabetes Neg Hx     Social History Social History   Tobacco Use  . Smoking status: Former Smoker    Packs/day: 1.00    Years: 40.00    Pack years: 40.00    Types: Cigarettes    Quit date: 07/21/1995    Years since quitting: 23.9  . Smokeless tobacco: Never Used  Substance Use Topics  . Alcohol use: Yes    Alcohol/week: 16.0 standard drinks    Types: 2 Glasses of wine, 14 Standard drinks or equivalent per week    Comment: daily 1-2  . Drug use: No    Review of Systems  Constitutional: No dizziness Eyes: No visual changes.  ENT: No neck pain Cardiovascular: Denies chest wall pain.  Musculoskeletal: Hip pain Skin: Negative for laceration Neurological: Negative for headaches or weakness   ____________________________________________   PHYSICAL EXAM:  VITAL SIGNS: ED Triage Vitals  Enc Vitals Group     BP  07/03/19 1112 132/72     Pulse Rate 07/03/19 1112 60     Resp 07/03/19 1112 18     Temp 07/03/19 1112 97.8 F (36.6 C)     Temp Source 07/03/19 1112 Oral     SpO2 07/03/19 1112 100 %     Weight 07/03/19 1114 54.9 kg (121 lb 0.5 oz)     Height 07/03/19 1114 1.6 m (5\' 3" )     Head Circumference --      Peak Flow --      Pain Score 07/03/19 1113 2     Pain Loc --      Pain Edu? --  Excl. in [place]? --     Constitutional: Alert and oriented.   Head: Atraumatic. Nose: No swelling epistaxis Mouth/Throat: Mucous membranes are moist.   Neck:  Painless ROM Cardiovascular: Normal rate, regular rhythm.Good peripheral circulation. Respiratory: Normal respiratory effort.  No retractions.   Musculoskeletal: Tenderness over the right lateral hip, well-perfused Neurologic:  Normal speech and language. No gross focal neurologic deficits are appreciated.  Skin:  Skin is warm, dry and intact.  Psychiatric: Mood and affect are normal. Speech and behavior are normal.  ____________________________________________   LABS (all labs ordered are listed, but only abnormal results are displayed)  Labs Reviewed  CBC - Abnormal; Notable for the following components:      Result Value   WBC 11.3 (*)    RBC 2.89 (*)    Hemoglobin 10.6 (*)    HCT 31.7 (*)    MCV 109.7 (*)    MCH 36.7 (*)    All other components within normal limits  BASIC METABOLIC PANEL - Abnormal; Notable for the following components:   Chloride 96 (*)    Glucose, Bld 110 (*)    All other components within normal limits   ____________________________________________  EKG   ____________________________________________  RADIOLOGY  X-ray demonstrates acute nondisplaced fracture of the right greater trochanter ____________________________________________   PROCEDURES  Procedure(s) performed: No  Procedures   Critical Care performed: No ____________________________________________   INITIAL IMPRESSION /  ASSESSMENT AND PLAN / ED COURSE  Pertinent labs & imaging results that were available during my care of the patient were reviewed by me and considered in my medical decision making (see chart for details).  Patient with trochanter fracture as noted, hardware intact, treated with IV morphine, IV Zofran with significant improvement pain.  Discussed x-ray results with Dr. Mack Guise of orthopedics who recommends discharge home with walker weightbearing as tolerated, outpatient follow-up Dr. Marry Guan    ____________________________________________   FINAL CLINICAL IMPRESSION(S) / ED DIAGNOSES  Final diagnoses:  Closed nondisplaced fracture of greater trochanter of right femur, initial encounter [facility])        Note:  This document was prepared using Dragon voice recognition software and may include unintentional dictation errors.   Lavonia Drafts, MD 07/03/19 1537

## 2019-07-03 NOTE — Discharge Instructions (Signed)
You are allowed to bear weight on your leg as tolerated. Please use a walker

## 2019-07-03 NOTE — Telephone Encounter (Signed)
Spoke with daughter Amy (DPR) she advised patient agrees with ER MD that she can go home and with another daughters assistance stay home, advised daughter that if patient is not agreeable for Rehab or facility for nursing care than there is nothing that can be done to make a patient go to rehab or nursing.  unless they are incompetent, daughter said no she just feels her mother needs to be with a trained medical person such as a CNA. Patient has been advised by ER she can go home and rehab.

## 2019-07-03 NOTE — Telephone Encounter (Signed)
Mrs. [name] is  at [facility] and they are sending her home on pain pills and she cannot use her walker because  of the pain.  Please advise. [name],cma

## 2019-07-03 NOTE — Telephone Encounter (Signed)
Noted. ER note reviewed.  

## 2019-07-03 NOTE — Consult Note (Signed)
Called by Dr. Corky Downs about this patient who fell.  I have reviewed the xrays taken in the ER.  She is 78 y/o with a previous THA and has a comminuted fracture of the greater tuberosity with minimal displacement.   There is no evidence on xray films of loosening of the prosthesis.  Recommend protected weight bearing on the right lower extremity with a walker and follow up with Dr. Marry Guan who performed her THA.

## 2019-07-03 NOTE — Telephone Encounter (Signed)
Pt called with complaints of being light headed and dizzy; she fell on 07/01/2019, and since then she has hard a time ambulating; she feels like she is going to faint anytime that she gets up; the pt also complains of right rip pain due to the fall; she slipped in the kitchen when she was cooking; the pt says her symptoms are debilitating, and it is hard for her to use her walker; recommendations made per nurse triage protocol; she verbalized understanding; the pt sees Dr Caryl Bis, [facility]; will route to office for notification.   Reason for Disposition . SEVERE dizziness (e.g., unable to stand, requires support to walk, feels like passing out now)  Answer Assessment - Initial Assessment Questions 1. DESCRIPTION: "Describe your dizziness."     lightheaded 2. LIGHTHEADED: "Do you feel lightheaded?" (e.g., somewhat faint, woozy, weak upon standing)    Feels faint when standing 3. VERTIGO: "Do you feel like either you or the room is spinning or tilting?" (i.e. vertigo)    4. SEVERITY: "How bad is it?"  "Do you feel like you are going to faint?" "Can you stand and walk?"   - MILD - walking normally   - MODERATE - interferes with normal activities (e.g., work, school)    - SEVERE - unable to stand, requires support to walk, feels like passing out now.     severe 5. ONSET:  "When did the dizziness begin?"     07/01/2019 6. AGGRAVATING FACTORS: "Does anything make it worse?" (e.g., standing, change in head position)     standing 7. HEART RATE: "Can you tell me your heart rate?" "How many beats in 15 seconds?"  (Note: not all patients can do this)        8. CAUSE: "What do you think is causing the dizziness?"    Not sure; pt states started after falling on 07/01/2019 9. RECURRENT SYMPTOM: "Have you had dizziness before?" If so, ask: "When was the last time?" "What happened that time?"      10. OTHER SYMPTOMS: "Do you have any other symptoms?" (e.g., fever, chest pain, vomiting, diarrhea,  bleeding)       Right hip pain 11. PREGNANCY: "Is there any chance you are pregnant?" "When was your last menstrual period?"    no  Protocols used: DIZZINESS [facility]

## 2019-07-25 ENCOUNTER — Ambulatory Visit: Payer: Medicare Other

## 2019-07-25 DIAGNOSIS — M25551 Pain in right hip: Secondary | ICD-10-CM | POA: Diagnosis not present

## 2019-07-25 DIAGNOSIS — M9701XA Periprosthetic fracture around internal prosthetic right hip joint, initial encounter: Secondary | ICD-10-CM | POA: Diagnosis not present

## 2019-08-07 ENCOUNTER — Other Ambulatory Visit: Payer: Self-pay | Admitting: Family Medicine

## 2019-08-08 DIAGNOSIS — M9701XD Periprosthetic fracture around internal prosthetic right hip joint, subsequent encounter: Secondary | ICD-10-CM | POA: Diagnosis not present

## 2019-08-09 ENCOUNTER — Ambulatory Visit: Payer: Medicare Other | Admitting: Internal Medicine

## 2019-08-22 DIAGNOSIS — M9701XD Periprosthetic fracture around internal prosthetic right hip joint, subsequent encounter: Secondary | ICD-10-CM | POA: Diagnosis not present

## 2019-08-22 DIAGNOSIS — M25551 Pain in right hip: Secondary | ICD-10-CM | POA: Diagnosis not present

## 2019-09-05 DIAGNOSIS — M8949 Other hypertrophic osteoarthropathy, multiple sites: Secondary | ICD-10-CM | POA: Diagnosis not present

## 2019-09-05 DIAGNOSIS — L409 Psoriasis, unspecified: Secondary | ICD-10-CM | POA: Diagnosis not present

## 2019-09-05 DIAGNOSIS — M1A9XX1 Chronic gout, unspecified, with tophus (tophi): Secondary | ICD-10-CM | POA: Diagnosis not present

## 2019-09-07 ENCOUNTER — Ambulatory Visit: Payer: PRIVATE HEALTH INSURANCE | Admitting: Podiatry

## 2019-09-14 ENCOUNTER — Ambulatory Visit: Payer: PRIVATE HEALTH INSURANCE | Admitting: Podiatry

## 2019-09-18 ENCOUNTER — Other Ambulatory Visit: Payer: Self-pay

## 2019-09-18 ENCOUNTER — Ambulatory Visit: Payer: Medicare Other | Attending: Orthopedic Surgery

## 2019-09-18 DIAGNOSIS — R2681 Unsteadiness on feet: Secondary | ICD-10-CM

## 2019-09-18 DIAGNOSIS — M25551 Pain in right hip: Secondary | ICD-10-CM

## 2019-09-18 DIAGNOSIS — Z9181 History of falling: Secondary | ICD-10-CM | POA: Insufficient documentation

## 2019-09-18 NOTE — Therapy (Signed)
[facility] [address] [place], [address] Phone: [phone]   Fax:  [phone]  Physical Therapy Evaluation  Patient Details  Name: [name] MRN: [id number] Date of Birth: 1941/07/09 Referring Provider (PT): Tamala Julian   Encounter Date: 09/18/2019  PT End of Session - 09/18/19 1407    Visit Number  1    Number of Visits  13    Date for PT Re-Evaluation  10/30/19    Authorization Type  1/10 Medicare    PT Start Time  1100    PT Stop Time  1200    PT Time Calculation (min)  60 min    Activity Tolerance  Patient tolerated treatment well    Behavior During Therapy  [facility] for tasks assessed/performed       Past Medical History:  Diagnosis Date  . (HFpEF) heart failure with preserved ejection fraction ([place])    a. 08/2017 Echo: EF 55-60%, no rwma, mild MR, mildly dil LA, nl RV fxn.  . Breast cancer ([place]) 2001   left breast  . Cancer ([place]) 2001   left breast ca  . Carotid arterial disease ([place])    a. 10/2004 s/p L CEA; b. 12/2015 Carotid U/S: RICA 1-39%; b. LICA patent CEA site.  Marland Kitchen GERD (gastroesophageal reflux disease)   . Hyperlipidemia   . Hypertension   . Osteoarthritis, multiple sites   . Osteopenia   . Persistent atrial fibrillation ([place])    a. Dx 08/2017; b. CHA2DS2VASc = 6-->Xarelto; c. 09/2017 Successful DCCV (second shock - 200J);   Marland Kitchen Personal history of radiation therapy 2001   left breast ca  . Psoriasis     Past Surgical History:  Procedure Laterality Date  . ABDOMINAL HYSTERECTOMY    . ANKLE FRACTURE SURGERY  4/08   left---hardware still in place  . 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 BREAST EXCISIONAL BIOPSY Left yrs ago   benign  . BREAST LUMPECTOMY Left 2001   f/u radiation  . CARDIOVERSION N/A 10/04/2017   Procedure: CARDIOVERSION;  Surgeon: Wellington Hampshire, MD;  Location: [facility] ORS;  Service: Cardiovascular;  Laterality: N/A;  . CARDIOVERSION N/A 12/16/2017    Procedure: CARDIOVERSION;  Surgeon: [name], MD;  Location: [facility] ORS;  Service: Cardiovascular;  Laterality: N/A;  . CAROTID ENDARTERECTOMY Left 10/23/2004  . FRACTURE SURGERY    . OOPHORECTOMY    . SHOULDER SURGERY  6/07   left  . TONSILLECTOMY AND ADENOIDECTOMY    . TOTAL HIP ARTHROPLASTY  2004   right    There were no vitals filed for this visit.   Subjective Assessment - 09/18/19 1110    Subjective  Patient reports increased hip pain after experiencing a fall in mid December 2020 which resulted in a subsequent hip fx. Patient did not have a surgery as the fx was closed. Patient is states she has increased pain with transferring from sitting to standing, walking after sitting for a long period of time and driving. Patient states she is has signficant difficulty with performing hip flexion, most notably in sitting. Patient states this pain has impacted her ability to perform heavy household tasks such as vaccuming and reports she feels very unsteady. Patient states she has a cane and does not know how to use it. Patient would like to address her deficits in pain, strength, and balance.    Pertinent History  Patient previously seen for balance difficulties, shoulder pain  and the right, and low back pain.  Patient reports no falls in the past six months. Patient reports pain has not improved for the past month and has been about the same since the onset of pain.    Limitations  Sitting;Standing;Walking    Patient Stated Goals  To improve balance when walking, driving pain    Currently in Pain?  No/denies    Pain Score  8    Worst: 8/10   Pain Location  Hip    Pain Orientation  Right    Pain Descriptors / Indicators  Aching    Pain Type  Acute pain    Pain Onset  More than a month ago    Pain Frequency  Intermittent         [facility] PT Assessment - 09/18/19 0001      Assessment   Medical Diagnosis  R hip pain    Referring Provider (PT)  Tamala Julian    Onset Date/Surgical  Date  06/19/20    Hand Dominance  Right    Next MD Visit  unknown     Prior Therapy  yes      Precautions   Precautions  None      Restrictions   Weight Bearing Restrictions  No      Balance Screen   Has the patient fallen in the past 6 months  Yes    How many times?  1    Has the patient had a decrease in activity level because of a fear of falling?   Yes    Is the patient reluctant to leave their home because of a fear of falling?   No      Home Social worker  Private residence    Living Arrangements  Alone    Available Help at Discharge  Family    Type of Home  Other(Comment)    Home Access  Level entry    Home Layout  One level    [place]  None      Prior Function   Level of [place]  [place]    Vocation  Retired    Tree surgeon, reading, play games      Cognition   Overall Cognitive Status  Within Functional Limits for tasks assessed      Observation/Other Assessments   Observations  Increased lumbar flexion in standing, wide ER stance    Focus on Therapeutic Outcomes (FOTO)   44      Sensation   Light Touch  Appears Intact      Functional Tests   Functional tests  Sit to Stand   Requires use of hands to perform from lower surface     Posture/Postural Control   Posture/Postural Control  Postural limitations    Posture Comments  Increased lumbar flexion, decreased hip ext, knee's flexed      ROM / Strength   AROM / PROM / Strength  AROM;Strength;PROM      AROM   Right Hip Extension  -5   Measured in standing   Right Hip Flexion  110   p!   Right Hip External Rotation   10    Right Hip Internal Rotation   10    Right Hip ABduction  15    Left Hip Flexion  120    Left Hip External Rotation   30    Left Hip Internal Rotation   25  Left Hip ABduction  35    Right/Left Knee  Right;Left    Right Knee Extension  -10    Right Knee Flexion  110    Left Knee Extension  0     Left Knee Flexion  110      PROM   PROM Assessment Site  Hip    Right/Left Hip  Right;Left    Right Hip Extension  0    Right Hip Flexion  120    Right Hip External Rotation   35    Right Hip Internal Rotation   35    Right Hip ABduction  35    Right Hip ADduction  35      Strength   Right Hip Flexion  3-/5    Right Hip External Rotation   2+/5    Right Hip Internal Rotation  2+/5    Right Hip ABduction  3/5    Left Hip Flexion  4-/5    Left Hip External Rotation  4-/5    Left Hip Internal Rotation  4-/5    Left Hip ABduction  4-/5    Right Knee Flexion  4-/5    Right Knee Extension  4/5    Left Knee Flexion  4-/5    Left Knee Extension  4/5      Palpation   Palpation comment  TTP along hip flexor on the R      Special Tests    Special Tests  Hip Special Tests    Hip Special Tests   Saralyn Pilar (FABER) Test      Saralyn Pilar [facility]) Test   Findings  Negative      Transfers   Comments  Sit to stand: Requires UE support      Ambulation/Gait   Ambulation Distance (Feet)  200 Feet    Assistive device  --   SPC   Gait Pattern  Decreased stride length    Gait Comments  Hip ER in standing, decreased stride length B, forward lumbar flexion      High Level Balance   High Level Balance Comments  SLS: 2.5 sec each leg       Objective measurements completed on examination: See above findings.     TREATMENT Therapeutic Exercise Hip abduction in standing -- x 10 B Hip extension in standing -- x 10 B Hip flexor stretch -- x5 with 5 sec holds  Performed exercises to improve strength and improve SLS  PT Education - 09/18/19 1406    Education Details  form/technique with exercise; hip ext, abd, and hip flexor stretch    Person(s) Educated  Patient    Methods  Explanation;Demonstration;Handout;Verbal cues    Comprehension  Returned demonstration;Verbalized understanding       PT Short Term Goals - 09/18/19 1414      PT SHORT TERM GOAL #1   Title  Patient will have a  worst pain score of 6/10 to better be able to drive with less overall pain    Baseline  8/10    Time  3    Period  Weeks    Status  New    Target Date  10/09/19        PT Long Term Goals - 09/18/19 1414      PT LONG TERM GOAL #1   Title  Patient will be independent with HEP to continue benefits of therapy after discharge.     Baseline  Dependent with form and technique for exercises  Time  6    Period  Weeks    Status  New    Target Date  10/30/19      PT LONG TERM GOAL #2   Title  Patient will improve 68mWT to over 1 m/s to indicate increased safety with ambulating in a community environment.    Baseline  .66 m/s    Time  6    Period  Weeks    Status  New    Target Date  10/30/19      PT LONG TERM GOAL #3   Title  Patient will improve FGA to 63 to indicate significant functional improvement of R hip function.    Baseline  FGA: 44    Time  6    Period  Weeks    Status  New    Target Date  10/30/19      PT LONG TERM GOAL #4   Title  Patient will be able to balance for 10sec with SLS to improve static balance and ability to get dressed when in standing    Baseline  2.5 sec B    Time  6    Period  Weeks    Status  New    Target Date  10/30/19      PT LONG TERM GOAL #5   Title  Patient will have a worst hip pain of a 3/10 over the past week to indicate functional improvement with transferring and driving    Baseline  worst pain: 8/10    Time  6    Period  Weeks    Status  New    Target Date  10/30/19             Plan - 09/18/19 1407    Clinical Impression Statement  Patient is a 79 yo right hand dominant female presenting with increased hip dysfunction and pain s/p a fall resulting in a fracture of mid December 2020. Patient increased dysfunction is indicated by decreased gait speed, poor gait patterning, increased pain with hip flexion, and difficulty with functional tasks such as driving and ascension/descension of the stairs. Patient's pain most likely  due to an iliopsoas involvement with concordant pain with performing hip flexion. Patient demonstrates decreased muscular weakness, endurance, power and balance; patient will benefit from further skilled therapy to return to prior level of function.    Personal Factors and Comorbidities  Age;Comorbidity 2    Comorbidities  hip fx, RA    Examination-Activity Limitations  Lift;Squat;Stand;Transfers    Examination-Participation Restrictions  Driving    Stability/Clinical Decision Making  Evolving/Moderate complexity    Clinical Decision Making  Moderate    Rehab Potential  Good    PT Frequency  2x / week    PT Duration  6 weeks    PT Treatment/Interventions  Neuromuscular re-education;Stair training;Moist Heat;Cryotherapy;Iontophoresis 4mg /ml Dexamethasone;Electrical Stimulation;DME Instruction;Therapeutic activities;Therapeutic exercise;Balance training;Manual techniques;Patient/family education;Passive range of motion;Dry needling;Joint Manipulations    PT Next Visit Plan  progress hip strengthening, use of SPC    PT Home Exercise Plan  See education section    Consulted and Agree with Plan of Care  Patient       Patient will benefit from skilled therapeutic intervention in order to improve the following deficits and impairments:  Abnormal gait, Decreased activity tolerance, Decreased endurance, Decreased range of motion, Decreased strength, Decreased balance, Difficulty walking, Decreased cognition, Pain, Decreased coordination, Increased muscle spasms, Postural dysfunction, Hypomobility  Visit Diagnosis: History of falling  Unsteadiness on feet  Pain in right hip     Problem List Patient Active Problem List   Diagnosis Date Noted  . Gout 05/18/2019  . [place] 03/09/2019  . Right hip pain 07/08/2018  . Chronic bilateral low back pain without sciatica 12/31/2017  . Atrial fibrillation ([place]) 09/10/2017  . Trapezius strain, right, initial encounter 09/10/2017  . Depression, major,  single episode, mild ([place]) 09/10/2017  . Chronic venous insufficiency 09/09/2017  . Lymphedema 09/09/2017  . Pain of toe of left foot 08/17/2017  . Grief 08/17/2017  . Leg swelling 07/31/2017  . Dry eyes 07/06/2017  . Pain and swelling of lower extremity, right 03/29/2017  . Right shoulder pain 03/29/2017  . Diarrhea 05/18/2016  . Fall 05/01/2016  . Unintentional weight loss 12/30/2015  . Leg weakness, bilateral 12/30/2015  . Occasional numbness/prickling/tingling of fingers and toes 12/18/2014  . Macrocytic anemia 05/19/2013  . Psoriatic arthritis ([place]) 02/03/2013  . Right hand pain 08/05/2012  . Psoriasis   . Osteoarthritis, multiple sites   . Episodic mood disorder ([place]) 12/01/2011  . History of breast cancer 10/28/2011  . GERD (gastroesophageal reflux disease)   . Hyperlipidemia   . Hypertension   . Osteopenia   . Carotid stenosis 05/27/2011    Blythe Stanford, PT DPT 09/18/2019, 2:43 PM  [facility] [address] [place], [address] Phone: [phone]   Fax:  [phone]  Name: DAZHANE LANCON MRN: [id number] Date of Birth: January 06, 1941

## 2019-09-20 ENCOUNTER — Encounter: Payer: Self-pay | Admitting: Family Medicine

## 2019-09-20 ENCOUNTER — Ambulatory Visit (INDEPENDENT_AMBULATORY_CARE_PROVIDER_SITE_OTHER): Payer: Medicare Other | Admitting: Family Medicine

## 2019-09-20 ENCOUNTER — Other Ambulatory Visit: Payer: Self-pay

## 2019-09-20 VITALS — BP 125/79 | HR 71 | Temp 96.6°F | Ht 66.0 in | Wt 114.6 lb

## 2019-09-20 DIAGNOSIS — I4891 Unspecified atrial fibrillation: Secondary | ICD-10-CM

## 2019-09-20 DIAGNOSIS — S72001D Fracture of unspecified part of neck of right femur, subsequent encounter for closed fracture with routine healing: Secondary | ICD-10-CM | POA: Diagnosis not present

## 2019-09-20 DIAGNOSIS — S72001A Fracture of unspecified part of neck of right femur, initial encounter for closed fracture: Secondary | ICD-10-CM | POA: Insufficient documentation

## 2019-09-20 DIAGNOSIS — Z780 Asymptomatic menopausal state: Secondary | ICD-10-CM

## 2019-09-20 DIAGNOSIS — F320 Major depressive disorder, single episode, mild: Secondary | ICD-10-CM

## 2019-09-20 HISTORY — DX: Fracture of unspecified part of neck of right femur, initial encounter for closed fracture: S72.001A

## 2019-09-20 MED ORDER — TRAMADOL HCL 50 MG PO TABS: 50.0000 mg | ORAL_TABLET | Freq: Two times a day (BID) | ORAL | 0 refills | Status: DC | PRN

## 2019-09-20 NOTE — Assessment & Plan Note (Addendum)
Progressing well. We will trial tramadol for pain. Discussed risk of drowsiness with this and to not drive if she becomes drowsy.  She will let us know if she became drowsy.  DEXA scan ordered.

## 2019-09-20 NOTE — Progress Notes (Signed)
[name], MD Phone: [phone]  [name] is a 79 y.o. female who presents today for f/u.  Depression/anxiety: Patient notes no depression or anxiety at this time.  She wonders about coming off the Remeron.  No SI.  She very occasionally takes the Ativan for sleep if her mind is racing.  No drowsiness.  No alcohol intake.  Atrial fibrillation: Taking metoprolol, Pradaxa, and flecainide.  No palpitations, chest pain, or bleeding.  Fall/right hip fracture: Patient had a fall back in December where she lost her balance and fell onto her hip.  She suffered an acute mildly displaced fracture of the right greater trochanter.  She has a hip replacement in that hip and there is no dislocation or hardware loosening.  She followed up with orthopedics and is doing physical therapy.  She does still have some pain and is taking Tylenol which is not beneficial.  She did take tramadol after the fall and had no side effects.  She is due for a DEXA scan.  Social History   Tobacco Use  Smoking Status Former Smoker  . Packs/day: 1.00  . Years: 40.00  . Pack years: 40.00  . Types: Cigarettes  . Quit date: 07/21/1995  . Years since quitting: 24.1  Smokeless Tobacco Never Used     ROS see history of present illness  Objective  Physical Exam Vitals:   09/20/19 0905  BP: 125/79  Pulse: 71  Temp: (!) 96.6 F (35.9 C)  SpO2: 99%    BP Readings from Last 3 Encounters:  09/20/19 125/79  07/03/19 (!) 158/90  05/17/19 140/80   Wt Readings from Last 3 Encounters:  09/20/19 114 lb 9.6 oz (52 kg)  07/03/19 121 lb 0.5 oz (54.9 kg)  05/17/19 121 lb (54.9 kg)    Physical Exam Constitutional:      General: She is not in acute distress.    Appearance: She is not diaphoretic.  Cardiovascular:     Rate and Rhythm: Normal rate and regular rhythm.     Heart sounds: Normal heart sounds.  Pulmonary:     Effort: Pulmonary effort is normal.     Breath sounds: Normal breath sounds.    Musculoskeletal:     Comments: Right lateral hip nontender, there is slight decreased internal range of motion of the right hip external range of motion is intact, left hip with intact internal and external range of motion  Skin:    General: Skin is warm and dry.  Neurological:     Mental Status: She is alert.      Assessment/Plan: Please see individual problem list.  Atrial fibrillation (HCC) Sinus rhythm today.  She will continue her current regimen. She will be due for a CBC at her next visit.   Closed right hip fracture ([place]) Progressing well. We will trial tramadol for pain. Discussed risk of drowsiness with this and to not drive if she becomes drowsy.  She will let us know if she became drowsy.  DEXA scan ordered.  Depression, major, single episode, mild (HCC) Overall doing well.  She was given instructions to taper off of the Remeron.  If she has worsening symptoms she will go back to the prior dose.   Orders Placed This Encounter  Procedures  . DG Bone Density    Standing Status:   Future    Standing Expiration Date:   11/19/2020    Order Specific Question:   Reason for Exam (SYMPTOM  OR DIAGNOSIS REQUIRED)  Answer:   hip fracture, history of osteopenia    Order Specific Question:   Preferred imaging location?    Answer:   [facility] ordered this encounter  Medications  . traMADol (ULTRAM) 50 MG tablet    Sig: Take 1 tablet (50 mg total) by mouth every 12 (twelve) hours as needed for up to 5 days.    Dispense:  10 tablet    Refill:  0    This visit occurred during the SARS-CoV-2 public health emergency.  Safety protocols were in place, including screening questions prior to the visit, additional usage of staff PPE, and extensive cleaning of exam room while observing appropriate contact time as indicated for disinfecting solutions.    [name], MD [place]

## 2019-09-20 NOTE — Assessment & Plan Note (Signed)
Sinus rhythm today.  She will continue her current regimen. She will be due for a CBC at her next visit.

## 2019-09-20 NOTE — Patient Instructions (Signed)
Nice to see you. We will trial tramadol for your pain. Please monitor for some drowsiness and if that occurs let us know. Please do not drive if you are drowsy.  Please continue PT for your hip. We will get a dexa scan.  Please taper off the mirtazepine. You will take 1/2 tablets (7.5 mg) once daily for 2 weeks and then take 1/2 tablet (7.5 mg) every other day for 2 weeks and then discontinue. If you have recurrence of depression or anxiety please go back to the prior dose and let us know.

## 2019-09-21 ENCOUNTER — Ambulatory Visit: Payer: Medicare Other

## 2019-09-21 DIAGNOSIS — Z9181 History of falling: Secondary | ICD-10-CM

## 2019-09-21 DIAGNOSIS — M25551 Pain in right hip: Secondary | ICD-10-CM

## 2019-09-21 DIAGNOSIS — R2681 Unsteadiness on feet: Secondary | ICD-10-CM | POA: Diagnosis not present

## 2019-09-21 NOTE — Therapy (Signed)
[facility] [address] [place], [address] Phone: [phone]   Fax:  [phone]  Physical Therapy Treatment  Patient Details  Name: [name] MRN: [id number] Date of Birth: Oct 22, 1940 Referring Provider (PT): Tamala Julian   Encounter Date: 09/21/2019  PT End of Session - 09/21/19 1614    Visit Number  2    Number of Visits  13    Date for PT Re-Evaluation  10/30/19    Authorization Type  2/10 Medicare    PT Start Time  1600    PT Stop Time  [id number]    PT Time Calculation (min)  45 min    Activity Tolerance  Patient tolerated treatment well    Behavior During Therapy  [facility] for tasks assessed/performed       Past Medical History:  Diagnosis Date  . (HFpEF) heart failure with preserved ejection fraction ([place])    a. 08/2017 Echo: EF 55-60%, no rwma, mild MR, mildly dil LA, nl RV fxn.  . Breast cancer ([place]) 2001   left breast  . Cancer ([place]) 2001   left breast ca  . Carotid arterial disease ([place])    a. 10/2004 s/p L CEA; b. 12/2015 Carotid U/S: RICA 1-39%; b. LICA patent CEA site.  Marland Kitchen GERD (gastroesophageal reflux disease)   . Hyperlipidemia   . Hypertension   . Osteoarthritis, multiple sites   . Osteopenia   . Persistent atrial fibrillation ([place])    a. Dx 08/2017; b. CHA2DS2VASc = 6-->Xarelto; c. 09/2017 Successful DCCV (second shock - 200J);   Marland Kitchen Personal history of radiation therapy 2001   left breast ca  . Psoriasis     Past Surgical History:  Procedure Laterality Date  . ABDOMINAL HYSTERECTOMY    . ANKLE FRACTURE SURGERY  4/08   left---hardware still in place  . 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 BREAST EXCISIONAL BIOPSY Left yrs ago   benign  . BREAST LUMPECTOMY Left 2001   f/u radiation  . CARDIOVERSION N/A 10/04/2017   Procedure: CARDIOVERSION;  Surgeon: Wellington Hampshire, MD;  Location: [facility] ORS;  Service: Cardiovascular;  Laterality: N/A;  . CARDIOVERSION N/A 12/16/2017    Procedure: CARDIOVERSION;  Surgeon: [name], MD;  Location: [facility] ORS;  Service: Cardiovascular;  Laterality: N/A;  . CAROTID ENDARTERECTOMY Left 10/23/2004  . FRACTURE SURGERY    . OOPHORECTOMY    . SHOULDER SURGERY  6/07   left  . TONSILLECTOMY AND ADENOIDECTOMY    . TOTAL HIP ARTHROPLASTY  2004   right    There were no vitals filed for this visit.  Subjective Assessment - 09/21/19 1608    Subjective  Patient reports increased pain in the morning but states it's improved now. She currently states a 4/10 pain in the R hip.    Pertinent History  Patient previously seen for balance difficulties, shoulder pain and the right, and low back pain.  Patient reports no falls in the past six months. Patient reports pain has not improved for the past month and has been about the same since the onset of pain.    Limitations  Sitting;Standing;Walking    Patient Stated Goals  To improve balance when walking, driving pain    Currently in Pain?  Yes    Pain Score  4     Pain Location  Hip    Pain Orientation  Right    Pain Descriptors / Indicators  Aching  Pain Type  Acute pain    Pain Onset  More than a month ago         TREATMENT Therapeutic Exercise Knees to chest with physioball - x 20  Bridges in hoolying -- x 20 LTRs with feet on physioball -- x 20  Hip abduction in sitting with GTB -- x 20 Hip adduction in sitting with pillow -- x 20  Performed to improve hip strengthening and decrease pain  Gait training  Step through gait patterning with use of SPC in L UE, patient requires frequent cueing for [facility] placement and reciprocal gait patterning -- x 270ft  Therapeutic Act Sit to stand from various surfaces -- 2 x 5 with UE support Transfer practicing from sit to stand from floor - -x1 heavy cues necessary to perform with good technique - no tactile assistance needed to perform   PT Education - 09/21/19 1613    Education Details  form/technique with exercise    Person(s)  Educated  Patient    Methods  Explanation;Demonstration    Comprehension  Returned demonstration;Verbalized understanding       PT Short Term Goals - 09/18/19 1414      PT SHORT TERM GOAL #1   Title  Patient will have a worst pain score of 6/10 to better be able to drive with less overall pain    Baseline  8/10    Time  3    Period  Weeks    Status  New    Target Date  10/09/19        PT Long Term Goals - 09/18/19 1414      PT LONG TERM GOAL #1   Title  Patient will be independent with HEP to continue benefits of therapy after discharge.     Baseline  Dependent with form and technique for exercises    Time  6    Period  Weeks    Status  New    Target Date  10/30/19      PT LONG TERM GOAL #2   Title  Patient will improve 22mWT to over 1 m/s to indicate increased safety with ambulating in a community environment.    Baseline  .66 m/s    Time  6    Period  Weeks    Status  New    Target Date  10/30/19      PT LONG TERM GOAL #3   Title  Patient will improve FGA to 63 to indicate significant functional improvement of R hip function.    Baseline  FGA: 44    Time  6    Period  Weeks    Status  New    Target Date  10/30/19      PT LONG TERM GOAL #4   Title  Patient will be able to balance for 10sec with SLS to improve static balance and ability to get dressed when in standing    Baseline  2.5 sec B    Time  6    Period  Weeks    Status  New    Target Date  10/30/19      PT LONG TERM GOAL #5   Title  Patient will have a worst hip pain of a 3/10 over the past week to indicate functional improvement with transferring and driving    Baseline  worst pain: 8/10    Time  6    Period  Weeks    Status  New  Target Date  10/30/19            Plan - 09/21/19 1656    Clinical Impression Statement  Patient demonstrates decreased quad lengthening with sidleying knee flexion/hip extension indicating recotoris femoris invovlement limiting ability to perform walking and  transfering from sitting to standing. Focused heavily on use of SPC with ambulation today to decrease overall fall risk, patient requires frequent verbal cueing to perform with proper technique. Patient dmeonstrates poor strength throughout her LE B and will benefit from further skilled therapy focused on improving strength, balance, and stabilization to decrease pain and spasms.    Personal Factors and Comorbidities  Age;Comorbidity 2    Comorbidities  hip fx, RA    Examination-Activity Limitations  Lift;Squat;Stand;Transfers    Examination-Participation Restrictions  Driving    Stability/Clinical Decision Making  Evolving/Moderate complexity    Rehab Potential  Good    PT Frequency  2x / week    PT Duration  6 weeks    PT Treatment/Interventions  Neuromuscular re-education;Stair training;Moist Heat;Cryotherapy;Iontophoresis 4mg /ml Dexamethasone;Electrical Stimulation;DME Instruction;Therapeutic activities;Therapeutic exercise;Balance training;Manual techniques;Patient/family education;Passive range of motion;Dry needling;Joint Manipulations    PT Next Visit Plan  progress hip strengthening, use of SPC    PT Home Exercise Plan  See education section    Consulted and Agree with Plan of Care  Patient       Patient will benefit from skilled therapeutic intervention in order to improve the following deficits and impairments:  Abnormal gait, Decreased activity tolerance, Decreased endurance, Decreased range of motion, Decreased strength, Decreased balance, Difficulty walking, Decreased cognition, Pain, Decreased coordination, Increased muscle spasms, Postural dysfunction, Hypomobility  Visit Diagnosis: History of falling  Unsteadiness on feet  Pain in right hip     Problem List Patient Active Problem List   Diagnosis Date Noted  . Closed right hip fracture ([place]) 09/20/2019  . Gout 05/18/2019  . Clavi 03/09/2019  . Right hip pain 07/08/2018  . Chronic bilateral low back pain without  sciatica 12/31/2017  . Atrial fibrillation ([place]) 09/10/2017  . Trapezius strain, right, initial encounter 09/10/2017  . Depression, major, single episode, mild ([place]) 09/10/2017  . Chronic venous insufficiency 09/09/2017  . Lymphedema 09/09/2017  . Pain of toe of left foot 08/17/2017  . Leg swelling 07/31/2017  . Dry eyes 07/06/2017  . Pain and swelling of lower extremity, right 03/29/2017  . Right shoulder pain 03/29/2017  . Diarrhea 05/18/2016  . Fall 05/01/2016  . Leg weakness, bilateral 12/30/2015  . Macrocytic anemia 05/19/2013  . Psoriatic arthritis ([place]) 02/03/2013  . Right hand pain 08/05/2012  . Psoriasis   . Osteoarthritis, multiple sites   . History of breast cancer 10/28/2011  . GERD (gastroesophageal reflux disease)   . Hypertension   . Osteopenia   . Carotid stenosis 05/27/2011    Blythe Stanford, PT DPT 09/21/2019, 4:59 PM  [facility] [address] [place], [address] Phone: [phone]   Fax:  [phone]  Name: [name] MRN: [id number] Date of Birth: [birth year]

## 2019-09-21 NOTE — Assessment & Plan Note (Signed)
Overall doing well.  She was given instructions to taper off of the Remeron.  If she has worsening symptoms she will go back to the prior dose.

## 2019-09-25 ENCOUNTER — Ambulatory Visit: Payer: Medicare Other

## 2019-09-25 ENCOUNTER — Other Ambulatory Visit: Payer: Self-pay

## 2019-09-25 DIAGNOSIS — Z9181 History of falling: Secondary | ICD-10-CM

## 2019-09-25 DIAGNOSIS — M25551 Pain in right hip: Secondary | ICD-10-CM | POA: Diagnosis not present

## 2019-09-25 DIAGNOSIS — R2681 Unsteadiness on feet: Secondary | ICD-10-CM | POA: Diagnosis not present

## 2019-09-25 NOTE — Therapy (Signed)
[facility] [address], [place], [address] Phone: [phone]   Fax:  [phone]  Physical Therapy Treatment  Patient Details  Name: [name] MRN: [id number] Date of Birth: May 23, 1941 Referring Provider (PT): Tamala Julian   Encounter Date: 09/25/2019  PT End of Session - 09/25/19 1441    Visit Number  3    Number of Visits  13    Date for PT Re-Evaluation  10/30/19    Authorization Type  3/10 Medicare    PT Start Time  1433    PT Stop Time  1515    PT Time Calculation (min)  42 min    Activity Tolerance  Patient tolerated treatment well    Behavior During Therapy  [facility] for tasks assessed/performed       Past Medical History:  Diagnosis Date  . (HFpEF) heart failure with preserved ejection fraction ([place])    a. 08/2017 Echo: EF 55-60%, no rwma, mild MR, mildly dil LA, nl RV fxn.  . Breast cancer ([place]) 2001   left breast  . Cancer ([place]) 2001   left breast ca  . Carotid arterial disease ([place])    a. 10/2004 s/p L CEA; b. 12/2015 Carotid U/S: RICA 1-39%; b. LICA patent CEA site.  Marland Kitchen GERD (gastroesophageal reflux disease)   . Hyperlipidemia   . Hypertension   . Osteoarthritis, multiple sites   . Osteopenia   . Persistent atrial fibrillation ([place])    a. Dx 08/2017; b. CHA2DS2VASc = 6-->Xarelto; c. 09/2017 Successful DCCV (second shock - 200J);   Marland Kitchen Personal history of radiation therapy 2001   left breast ca  . Psoriasis     Past Surgical History:  Procedure Laterality Date  . ABDOMINAL HYSTERECTOMY    . ANKLE FRACTURE SURGERY  4/08   left---hardware still in place  . 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 BREAST EXCISIONAL BIOPSY Left yrs ago   benign  . BREAST LUMPECTOMY Left 2001   f/u radiation  . CARDIOVERSION N/A 10/04/2017   Procedure: CARDIOVERSION;  Surgeon: Wellington Hampshire, MD;  Location: [facility] ORS;  Service: Cardiovascular;  Laterality: N/A;  . CARDIOVERSION N/A 12/16/2017    Procedure: CARDIOVERSION;  Surgeon: [name], MD;  Location: [facility] ORS;  Service: Cardiovascular;  Laterality: N/A;  . CAROTID ENDARTERECTOMY Left 10/23/2004  . FRACTURE SURGERY    . OOPHORECTOMY    . SHOULDER SURGERY  6/07   left  . TONSILLECTOMY AND ADENOIDECTOMY    . TOTAL HIP ARTHROPLASTY  2004   right    There were no vitals filed for this visit.  Subjective Assessment - 09/25/19 1438    Subjective  Patient states her hip is bothering her more since this am. Patient reports increased walking and states her pain is a 4/10    Pertinent History  Patient previously seen for balance difficulties, shoulder pain and the right, and low back pain.  Patient reports no falls in the past six months. Patient reports pain has not improved for the past month and has been about the same since the onset of pain.    Limitations  Sitting;Standing;Walking    Patient Stated Goals  To improve balance when walking, driving pain    Currently in Pain?  Yes    Pain Score  4     Pain Location  Hip    Pain Orientation  Right    Pain Descriptors / Indicators  Aching  Pain Type  Acute pain    Pain Onset  More than a month ago    Pain Frequency  Intermittent         TREATMENT Therapeutic Exercise LTRs with feet on legs straight physioball -- x 20  LTRs with feet on physioball with knees bent - x 20  Manually resisted hip flexion - 2 x 5 5 sec hold (~10% max) SL RDL with UE support - x5 B with UE support Manually resisted ER/IR in hooklying - x5 with 5 sec holds   Performed to improve hip strengthening and decrease pain   Manual therapy STM over affected area (rectus femoris and psoas major) with patient in supine utilizing superficial techniques to decrease increased guarding and pain  --  PT Education - 09/25/19 1441    Education Details  form/technique with exercise    Person(s) Educated  Patient    Methods  Explanation;Demonstration    Comprehension  Verbalized understanding;Returned  demonstration       PT Short Term Goals - 09/18/19 1414      PT SHORT TERM GOAL #1   Title  Patient will have a worst pain score of 6/10 to better be able to drive with less overall pain    Baseline  8/10    Time  3    Period  Weeks    Status  New    Target Date  10/09/19        PT Long Term Goals - 09/18/19 1414      PT LONG TERM GOAL #1   Title  Patient will be independent with HEP to continue benefits of therapy after discharge.     Baseline  Dependent with form and technique for exercises    Time  6    Period  Weeks    Status  New    Target Date  10/30/19      PT LONG TERM GOAL #2   Title  Patient will improve 57mWT to over 1 m/s to indicate increased safety with ambulating in a community environment.    Baseline  .66 m/s    Time  6    Period  Weeks    Status  New    Target Date  10/30/19      PT LONG TERM GOAL #3   Title  Patient will improve FGA to 63 to indicate significant functional improvement of R hip function.    Baseline  FGA: 44    Time  6    Period  Weeks    Status  New    Target Date  10/30/19      PT LONG TERM GOAL #4   Title  Patient will be able to balance for 10sec with SLS to improve static balance and ability to get dressed when in standing    Baseline  2.5 sec B    Time  6    Period  Weeks    Status  New    Target Date  10/30/19      PT LONG TERM GOAL #5   Title  Patient will have a worst hip pain of a 3/10 over the past week to indicate functional improvement with transferring and driving    Baseline  worst pain: 8/10    Time  6    Period  Weeks    Status  New    Target Date  10/30/19            Plan - 09/25/19  1444    Clinical Impression Statement  Performed manual therapy to the psoas major and the rectus femoris today to decrease increased pain and spasms. Patient responds well to treatment with decreased pain at the end of the session. However, she continues to have difficulty in walking, especially with improving stride  length. Patient will benefit from further skilled therapy to return to prior level of function.    Personal Factors and Comorbidities  Age;Comorbidity 2    Comorbidities  hip fx, RA    Examination-Activity Limitations  Lift;Squat;Stand;Transfers    Examination-Participation Restrictions  Driving    Stability/Clinical Decision Making  Evolving/Moderate complexity    Rehab Potential  Good    PT Frequency  2x / week    PT Duration  6 weeks    PT Treatment/Interventions  Neuromuscular re-education;Stair training;Moist Heat;Cryotherapy;Iontophoresis 4mg /ml Dexamethasone;Electrical Stimulation;DME Instruction;Therapeutic activities;Therapeutic exercise;Balance training;Manual techniques;Patient/family education;Passive range of motion;Dry needling;Joint Manipulations    PT Next Visit Plan  progress hip strengthening, use of SPC    PT Home Exercise Plan  See education section    Consulted and Agree with Plan of Care  Patient       Patient will benefit from skilled therapeutic intervention in order to improve the following deficits and impairments:  Abnormal gait, Decreased activity tolerance, Decreased endurance, Decreased range of motion, Decreased strength, Decreased balance, Difficulty walking, Decreased cognition, Pain, Decreased coordination, Increased muscle spasms, Postural dysfunction, Hypomobility  Visit Diagnosis: History of falling  Unsteadiness on feet  Pain in right hip     Problem List Patient Active Problem List   Diagnosis Date Noted  . Closed right hip fracture ([place]) 09/20/2019  . Gout 05/18/2019  . Clavi 03/09/2019  . Right hip pain 07/08/2018  . Chronic bilateral low back pain without sciatica 12/31/2017  . Atrial fibrillation ([place]) 09/10/2017  . Trapezius strain, right, initial encounter 09/10/2017  . Depression, major, single episode, mild ([place]) 09/10/2017  . Chronic venous insufficiency 09/09/2017  . Lymphedema 09/09/2017  . Pain of toe of left foot 08/17/2017   . Leg swelling 07/31/2017  . Dry eyes 07/06/2017  . Pain and swelling of lower extremity, right 03/29/2017  . Right shoulder pain 03/29/2017  . Diarrhea 05/18/2016  . Fall 05/01/2016  . Leg weakness, bilateral 12/30/2015  . Macrocytic anemia 05/19/2013  . Psoriatic arthritis ([place]) 02/03/2013  . Right hand pain 08/05/2012  . Psoriasis   . Osteoarthritis, multiple sites   . History of breast cancer 10/28/2011  . GERD (gastroesophageal reflux disease)   . Hypertension   . Osteopenia   . Carotid stenosis 05/27/2011    Blythe Stanford, PT DPT 09/25/2019, 4:02 PM  [facility] [address], [place], [address] Phone: [phone]   Fax:  [phone]  Name: [name] MRN: [id number] Date of Birth: Apr 07, 1941

## 2019-09-28 ENCOUNTER — Ambulatory Visit: Payer: Medicare Other

## 2019-09-28 ENCOUNTER — Other Ambulatory Visit: Payer: Self-pay

## 2019-09-28 DIAGNOSIS — R2681 Unsteadiness on feet: Secondary | ICD-10-CM

## 2019-09-28 DIAGNOSIS — Z9181 History of falling: Secondary | ICD-10-CM

## 2019-09-28 DIAGNOSIS — M25551 Pain in right hip: Secondary | ICD-10-CM | POA: Diagnosis not present

## 2019-09-28 NOTE — Therapy (Signed)
[facility] [address], [place], [address] Phone: [phone]   Fax:  [phone]  Physical Therapy Treatment  Patient Details  Name: [name] MRN: [id number] Date of Birth: [birth year] Referring Provider (PT): Tamala Julian   Encounter Date: 09/28/2019  PT End of Session - 09/28/19 1552    Visit Number  4    Number of Visits  13    Date for PT Re-Evaluation  10/30/19    Authorization Type  4/10 Medicare    PT Start Time  1500    PT Stop Time  1545    PT Time Calculation (min)  45 min    Activity Tolerance  Patient tolerated treatment well    Behavior During Therapy  [facility] for tasks assessed/performed       Past Medical History:  Diagnosis Date  . (HFpEF) heart failure with preserved ejection fraction ([place])    a. 08/2017 Echo: EF 55-60%, no rwma, mild MR, mildly dil LA, nl RV fxn.  . Breast cancer ([place]) 2001   left breast  . Cancer ([place]) 2001   left breast ca  . Carotid arterial disease ([place])    a. 10/2004 s/p L CEA; b. 12/2015 Carotid U/S: RICA 1-39%; b. LICA patent CEA site.  Marland Kitchen GERD (gastroesophageal reflux disease)   . Hyperlipidemia   . Hypertension   . Osteoarthritis, multiple sites   . Osteopenia   . Persistent atrial fibrillation ([place])    a. Dx 08/2017; b. CHA2DS2VASc = 6-->Xarelto; c. 09/2017 Successful DCCV (second shock - 200J);   Marland Kitchen Personal history of radiation therapy 2001   left breast ca  . Psoriasis     Past Surgical History:  Procedure Laterality Date  . ABDOMINAL HYSTERECTOMY    . ANKLE FRACTURE SURGERY  4/08   left---hardware still in place  . 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 BREAST EXCISIONAL BIOPSY Left yrs ago   benign  . BREAST LUMPECTOMY Left 2001   f/u radiation  . CARDIOVERSION N/A 10/04/2017   Procedure: CARDIOVERSION;  Surgeon: Wellington Hampshire, MD;  Location: [facility] ORS;  Service: Cardiovascular;  Laterality: N/A;  . CARDIOVERSION N/A 12/16/2017   Procedure: CARDIOVERSION;  Surgeon: [name], MD;  Location: [facility] ORS;  Service: Cardiovascular;  Laterality: N/A;  . CAROTID ENDARTERECTOMY Left 10/23/2004  . FRACTURE SURGERY    . OOPHORECTOMY    . SHOULDER SURGERY  6/07   left  . TONSILLECTOMY AND ADENOIDECTOMY    . TOTAL HIP ARTHROPLASTY  2004   right    There were no vitals filed for this visit.  Subjective Assessment - 09/28/19 1504    Subjective  Patient reports her hip continues to bother her. Patient states she feels like her exercises are helping.    Pertinent History  Patient previously seen for balance difficulties, shoulder pain and the right, and low back pain.  Patient reports no falls in the past six months. Patient reports pain has not improved for the past month and has been about the same since the onset of pain.    Limitations  Sitting;Standing;Walking    Patient Stated Goals  To improve balance when walking, driving pain    Currently in Pain?  Yes    Pain Score  3     Pain Location  Hip    Pain Orientation  Right    Pain Descriptors / Indicators  Aching    Pain Type  Acute pain    Pain Onset  More than a month ago    Pain Frequency  Intermittent       TREATMENT Therapeutic Exercise  Standing hip abduction in standing -- x 10 B Standing tandem stance with rotation -- x 10 B Quad set in supine -- x10 on R Quad set with focus on imaging activation of SLR -- x 10 on R Pre-Gait in standing with UE support -- x 10  **Education on use of rollator at home  Performed to improve hip strengthening and decrease pain  Manual therapy  STM over affected area (rectus femoris and psoas major and iliacus) with patient in supine utilizing superficial techniques to decrease increased guarding and pain     PT Education - 09/28/19 1551    Education Details  form/technique with exercise; Education with ambulation with rollator    Person(s) Educated  Patient    Methods  Explanation;Demonstration    Comprehension   Verbalized understanding;Returned demonstration       PT Short Term Goals - 09/18/19 1414      PT SHORT TERM GOAL #1   Title  Patient will have a worst pain score of 6/10 to better be able to drive with less overall pain    Baseline  8/10    Time  3    Period  Weeks    Status  New    Target Date  10/09/19        PT Long Term Goals - 09/18/19 1414      PT LONG TERM GOAL #1   Title  Patient will be independent with HEP to continue benefits of therapy after discharge.     Baseline  Dependent with form and technique for exercises    Time  6    Period  Weeks    Status  New    Target Date  10/30/19      PT LONG TERM GOAL #2   Title  Patient will improve 2mWT to over 1 m/s to indicate increased safety with ambulating in a community environment.    Baseline  .66 m/s    Time  6    Period  Weeks    Status  New    Target Date  10/30/19      PT LONG TERM GOAL #3   Title  Patient will improve FGA to 63 to indicate significant functional improvement of R hip function.    Baseline  FGA: 44    Time  6    Period  Weeks    Status  New    Target Date  10/30/19      PT LONG TERM GOAL #4   Title  Patient will be able to balance for 10sec with SLS to improve static balance and ability to get dressed when in standing    Baseline  2.5 sec B    Time  6    Period  Weeks    Status  New    Target Date  10/30/19      PT LONG TERM GOAL #5   Title  Patient will have a worst hip pain of a 3/10 over the past week to indicate functional improvement with transferring and driving    Baseline  worst pain: 8/10    Time  6    Period  Weeks    Status  New    Target Date  10/30/19            Plan -  09/28/19 1553    Clinical Impression Statement  Performed manual therapy again with better results today with patient able to ambulate without significant increase in pain which is an improvement compared to the previous session. Introduced hip abduction and extension exercises to help stabilize  the hip with ambulation. Patient demonstrates ability to walk with rollator without difficulty after education; educated patient to use the rollator for stabilization at the community level. Patient will benefit from further skilled therapy to return to prior level of function.    Personal Factors and Comorbidities  Age;Comorbidity 2    Comorbidities  hip fx, RA    Examination-Activity Limitations  Lift;Squat;Stand;Transfers    Examination-Participation Restrictions  Driving    Stability/Clinical Decision Making  Evolving/Moderate complexity    Rehab Potential  Good    PT Frequency  2x / week    PT Duration  6 weeks    PT Treatment/Interventions  Neuromuscular re-education;Stair training;Moist Heat;Cryotherapy;Iontophoresis 4mg /ml Dexamethasone;Electrical Stimulation;DME Instruction;Therapeutic activities;Therapeutic exercise;Balance training;Manual techniques;Patient/family education;Passive range of motion;Dry needling;Joint Manipulations    PT Next Visit Plan  progress hip strengthening, use of SPC    PT Home Exercise Plan  See education section    Consulted and Agree with Plan of Care  Patient       Patient will benefit from skilled therapeutic intervention in order to improve the following deficits and impairments:  Abnormal gait, Decreased activity tolerance, Decreased endurance, Decreased range of motion, Decreased strength, Decreased balance, Difficulty walking, Decreased cognition, Pain, Decreased coordination, Increased muscle spasms, Postural dysfunction, Hypomobility  Visit Diagnosis: History of falling  Unsteadiness on feet  Pain in right hip     Problem List Patient Active Problem List   Diagnosis Date Noted  . Closed right hip fracture ([place]) 09/20/2019  . Gout 05/18/2019  . Clavi 03/09/2019  . Right hip pain 07/08/2018  . Chronic bilateral low back pain without sciatica 12/31/2017  . Atrial fibrillation ([place]) 09/10/2017  . Trapezius strain, right, initial encounter  09/10/2017  . Depression, major, single episode, mild ([place]) 09/10/2017  . Chronic venous insufficiency 09/09/2017  . Lymphedema 09/09/2017  . Pain of toe of left foot 08/17/2017  . Leg swelling 07/31/2017  . Dry eyes 07/06/2017  . Pain and swelling of lower extremity, right 03/29/2017  . Right shoulder pain 03/29/2017  . Diarrhea 05/18/2016  . Fall 05/01/2016  . Leg weakness, bilateral 12/30/2015  . Macrocytic anemia 05/19/2013  . Psoriatic arthritis ([place]) 02/03/2013  . Right hand pain 08/05/2012  . Psoriasis   . Osteoarthritis, multiple sites   . History of breast cancer 10/28/2011  . GERD (gastroesophageal reflux disease)   . Hypertension   . Osteopenia   . Carotid stenosis 05/27/2011    Blythe Stanford, PT DPT 09/28/2019, 3:57 PM  [place] [facility] [address] [place], [address] Phone: [phone]   Fax:  [phone]  Name: [name] MRN: [id number] Date of Birth: [birth year]

## 2019-09-29 ENCOUNTER — Other Ambulatory Visit: Payer: Self-pay | Admitting: Family Medicine

## 2019-09-29 NOTE — Telephone Encounter (Signed)
Refill request for Tramadol, last seen 09-21-19, last filled 05-17-19.  Please advise.

## 2019-10-02 ENCOUNTER — Ambulatory Visit: Payer: Medicare Other

## 2019-10-05 ENCOUNTER — Other Ambulatory Visit: Payer: Self-pay

## 2019-10-05 ENCOUNTER — Ambulatory Visit: Payer: Medicare Other

## 2019-10-05 DIAGNOSIS — M25551 Pain in right hip: Secondary | ICD-10-CM | POA: Diagnosis not present

## 2019-10-05 DIAGNOSIS — Z9181 History of falling: Secondary | ICD-10-CM | POA: Diagnosis not present

## 2019-10-05 DIAGNOSIS — R2681 Unsteadiness on feet: Secondary | ICD-10-CM

## 2019-10-05 NOTE — Therapy (Signed)
[facility] [address], [place], [address] Phone: [phone]   Fax:  [phone]  Physical Therapy Treatment  Patient Details  Name: [name] MRN: [id number] Date of Birth: [birth year] Referring Provider (PT): Tamala Julian   Encounter Date: 10/05/2019  PT End of Session - 10/05/19 1229    Visit Number  5    Number of Visits  13    Date for PT Re-Evaluation  10/30/19    Authorization Type  5/10 Medicare    PT Start Time  0945    PT Stop Time  1030    PT Time Calculation (min)  45 min    Activity Tolerance  Patient tolerated treatment well    Behavior During Therapy  [facility] for tasks assessed/performed       Past Medical History:  Diagnosis Date  . (HFpEF) heart failure with preserved ejection fraction ([place])    a. 08/2017 Echo: EF 55-60%, no rwma, mild MR, mildly dil LA, nl RV fxn.  . Breast cancer ([place]) 2001   left breast  . Cancer ([place]) 2001   left breast ca  . Carotid arterial disease ([place])    a. 10/2004 s/p L CEA; b. 12/2015 Carotid U/S: RICA 1-39%; b. LICA patent CEA site.  Marland Kitchen GERD (gastroesophageal reflux disease)   . Hyperlipidemia   . Hypertension   . Osteoarthritis, multiple sites   . Osteopenia   . Persistent atrial fibrillation ([place])    a. Dx 08/2017; b. CHA2DS2VASc = 6-->Xarelto; c. 09/2017 Successful DCCV (second shock - 200J);   Marland Kitchen Personal history of radiation therapy 2001   left breast ca  . Psoriasis     Past Surgical History:  Procedure Laterality Date  . ABDOMINAL HYSTERECTOMY    . ANKLE FRACTURE SURGERY  4/08   left---hardware still in place  . 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 BREAST EXCISIONAL BIOPSY Left yrs ago   benign  . BREAST LUMPECTOMY Left 2001   f/u radiation  . CARDIOVERSION N/A 10/04/2017   Procedure: CARDIOVERSION;  Surgeon: Wellington Hampshire, MD;  Location: [facility] ORS;  Service: Cardiovascular;  Laterality: N/A;  . CARDIOVERSION N/A 12/16/2017    Procedure: CARDIOVERSION;  Surgeon: [name], MD;  Location: [facility] ORS;  Service: Cardiovascular;  Laterality: N/A;  . CAROTID ENDARTERECTOMY Left 10/23/2004  . FRACTURE SURGERY    . OOPHORECTOMY    . SHOULDER SURGERY  6/07   left  . TONSILLECTOMY AND ADENOIDECTOMY    . TOTAL HIP ARTHROPLASTY  2004   right    There were no vitals filed for this visit.  Subjective Assessment - 10/05/19 0949    Subjective  Patient states her hip continues to have increased pain. Patient states she feels that her hip feels better throughout the day and improvement after a warm shower.    Pertinent History  Patient previously seen for balance difficulties, shoulder pain and the right, and low back pain.  Patient reports no falls in the past six months. Patient reports pain has not improved for the past month and has been about the same since the onset of pain.    Limitations  Sitting;Standing;Walking    Patient Stated Goals  To improve balance when walking, driving pain    Currently in Pain?  Yes    Pain Score  3     Pain Location  Hip    Pain Orientation  Right    Pain  Descriptors / Indicators  Aching    Pain Type  Acute pain    Pain Onset  More than a month ago    Pain Frequency  Intermittent         TREATMENT Therapeutic Exercise  LTRs - x 1.5 min Leg supported LAQ - x 10  Assisted hip flexion in supine with towel assisted hip flexion with IR /ER - x 10 Quad/glute set in supine -- x10 on R Hip extension leg extension - x 10 AAROM hip flexion in supine   **Education on use of rollator at home   Performed to improve hip strengthening and decrease pain  Manual therapy  STM over affected area (rectus femoris and psoas major and iliacus) with patient in supine utilizing superficial techniques to decrease increased guarding and pain    PT Education - 10/05/19 0957    Education Details  morning routine LTRs, hip flexion with towel    Person(s) Educated  Patient    Methods   Explanation;Demonstration    Comprehension  Verbalized understanding;Returned demonstration       PT Short Term Goals - 09/18/19 1414      PT SHORT TERM GOAL #1   Title  Patient will have a worst pain score of 6/10 to better be able to drive with less overall pain    Baseline  8/10    Time  3    Period  Weeks    Status  New    Target Date  10/09/19        PT Long Term Goals - 09/18/19 1414      PT LONG TERM GOAL #1   Title  Patient will be independent with HEP to continue benefits of therapy after discharge.     Baseline  Dependent with form and technique for exercises    Time  6    Period  Weeks    Status  New    Target Date  10/30/19      PT LONG TERM GOAL #2   Title  Patient will improve 7mWT to over 1 m/s to indicate increased safety with ambulating in a community environment.    Baseline  .66 m/s    Time  6    Period  Weeks    Status  New    Target Date  10/30/19      PT LONG TERM GOAL #3   Title  Patient will improve FGA to 63 to indicate significant functional improvement of R hip function.    Baseline  FGA: 44    Time  6    Period  Weeks    Status  New    Target Date  10/30/19      PT LONG TERM GOAL #4   Title  Patient will be able to balance for 10sec with SLS to improve static balance and ability to get dressed when in standing    Baseline  2.5 sec B    Time  6    Period  Weeks    Status  New    Target Date  10/30/19      PT LONG TERM GOAL #5   Title  Patient will have a worst hip pain of a 3/10 over the past week to indicate functional improvement with transferring and driving    Baseline  worst pain: 8/10    Time  6    Period  Weeks    Status  New    Target Date  10/30/19  Plan - 10/05/19 1230    Clinical Impression Statement  Patient demonstrates decreased pain after performing manual therapy, continues to demosntrate limitations with performing walking without significant hip ER. Patient demonstrates increased difficulty and  pain with perfoming hip flexion which aligns with increased pain with walking. Patient is improving overall and will benefit from further skilled therapy to return to prior level of function.    Personal Factors and Comorbidities  Age;Comorbidity 2    Comorbidities  hip fx, RA    Examination-Activity Limitations  Lift;Squat;Stand;Transfers    Examination-Participation Restrictions  Driving    Stability/Clinical Decision Making  Evolving/Moderate complexity    Rehab Potential  Good    PT Frequency  2x / week    PT Duration  6 weeks    PT Treatment/Interventions  Neuromuscular re-education;Stair training;Moist Heat;Cryotherapy;Iontophoresis 4mg /ml Dexamethasone;Electrical Stimulation;DME Instruction;Therapeutic activities;Therapeutic exercise;Balance training;Manual techniques;Patient/family education;Passive range of motion;Dry needling;Joint Manipulations    PT Next Visit Plan  progress hip strengthening, use of SPC    PT Home Exercise Plan  See education section    Consulted and Agree with Plan of Care  Patient       Patient will benefit from skilled therapeutic intervention in order to improve the following deficits and impairments:  Abnormal gait, Decreased activity tolerance, Decreased endurance, Decreased range of motion, Decreased strength, Decreased balance, Difficulty walking, Decreased cognition, Pain, Decreased coordination, Increased muscle spasms, Postural dysfunction, Hypomobility  Visit Diagnosis: History of falling  Unsteadiness on feet  Pain in right hip     Problem List Patient Active Problem List   Diagnosis Date Noted  . Closed right hip fracture ([place]) 09/20/2019  . Gout 05/18/2019  . Clavi 03/09/2019  . Right hip pain 07/08/2018  . Chronic bilateral low back pain without sciatica 12/31/2017  . Atrial fibrillation ([place]) 09/10/2017  . Trapezius strain, right, initial encounter 09/10/2017  . Depression, major, single episode, mild ([place]) 09/10/2017  . Chronic  venous insufficiency 09/09/2017  . Lymphedema 09/09/2017  . Pain of toe of left foot 08/17/2017  . Leg swelling 07/31/2017  . Dry eyes 07/06/2017  . Pain and swelling of lower extremity, right 03/29/2017  . Right shoulder pain 03/29/2017  . Diarrhea 05/18/2016  . Fall 05/01/2016  . Leg weakness, bilateral 12/30/2015  . Macrocytic anemia 05/19/2013  . Psoriatic arthritis ([place]) 02/03/2013  . Right hand pain 08/05/2012  . Psoriasis   . Osteoarthritis, multiple sites   . History of breast cancer 10/28/2011  . GERD (gastroesophageal reflux disease)   . Hypertension   . Osteopenia   . Carotid stenosis 05/27/2011    Blythe Stanford, PT DPT 10/05/2019, 12:35 PM  [place] [address] [place], [address] Phone: [phone]   Fax:  [phone]  Name: [name] MRN: [id number] Date of Birth: March 13, 1941

## 2019-10-06 ENCOUNTER — Other Ambulatory Visit: Payer: Self-pay | Admitting: *Deleted

## 2019-10-06 DIAGNOSIS — I6523 Occlusion and stenosis of bilateral carotid arteries: Secondary | ICD-10-CM

## 2019-10-09 ENCOUNTER — Other Ambulatory Visit: Payer: Self-pay

## 2019-10-09 ENCOUNTER — Ambulatory Visit: Payer: Medicare Other

## 2019-10-09 DIAGNOSIS — D3131 Benign neoplasm of right choroid: Secondary | ICD-10-CM | POA: Diagnosis not present

## 2019-10-09 DIAGNOSIS — M25551 Pain in right hip: Secondary | ICD-10-CM

## 2019-10-09 DIAGNOSIS — H353132 Nonexudative age-related macular degeneration, bilateral, intermediate dry stage: Secondary | ICD-10-CM | POA: Diagnosis not present

## 2019-10-09 DIAGNOSIS — R2681 Unsteadiness on feet: Secondary | ICD-10-CM

## 2019-10-09 DIAGNOSIS — Z9181 History of falling: Secondary | ICD-10-CM | POA: Diagnosis not present

## 2019-10-10 ENCOUNTER — Ambulatory Visit
Admission: RE | Admit: 2019-10-10 | Discharge: 2019-10-10 | Disposition: A | Discharge status: Home / Self Care | Payer: Medicare Other | Source: Ambulatory Visit | Attending: Family Medicine | Admitting: Family Medicine

## 2019-10-10 ENCOUNTER — Ambulatory Visit (INDEPENDENT_AMBULATORY_CARE_PROVIDER_SITE_OTHER): Payer: Medicare Other | Admitting: Physician Assistant

## 2019-10-10 ENCOUNTER — Ambulatory Visit (HOSPITAL_COMMUNITY)
Admission: RE | Admit: 2019-10-10 | Discharge: 2019-10-10 | Disposition: A | Discharge status: Home / Self Care | Payer: Medicare Other | Source: Ambulatory Visit | Attending: Vascular Surgery | Admitting: Vascular Surgery

## 2019-10-10 VITALS — BP 179/83 | HR 58 | Temp 97.7°F | Resp 16 | Ht 65.0 in | Wt 114.7 lb

## 2019-10-10 DIAGNOSIS — Z780 Asymptomatic menopausal state: Secondary | ICD-10-CM | POA: Diagnosis not present

## 2019-10-10 DIAGNOSIS — M810 Age-related osteoporosis without current pathological fracture: Secondary | ICD-10-CM | POA: Diagnosis not present

## 2019-10-10 DIAGNOSIS — I6523 Occlusion and stenosis of bilateral carotid arteries: Secondary | ICD-10-CM

## 2019-10-10 NOTE — Therapy (Signed)
[facility] [address], [place], [address] Phone: [phone]   Fax:  [phone]  Physical Therapy Treatment  Patient Details  Name: [name] MRN: [id number] Date of Birth: 1941/03/23 Referring Provider (PT): Tamala Julian   Encounter Date: 10/09/2019  PT End of Session - 10/09/19 1407    Visit Number  6    Number of Visits  13    Date for PT Re-Evaluation  10/30/19    Authorization Type  6/10 Medicare    PT Start Time  1345    PT Stop Time  1430    PT Time Calculation (min)  45 min    Activity Tolerance  Patient tolerated treatment well    Behavior During Therapy  [facility] for tasks assessed/performed       Past Medical History:  Diagnosis Date  . (HFpEF) heart failure with preserved ejection fraction ([place])    a. 08/2017 Echo: EF 55-60%, no rwma, mild MR, mildly dil LA, nl RV fxn.  . Breast cancer ([place]) 2001   left breast  . Cancer ([place]) 2001   left breast ca  . Carotid arterial disease ([place])    a. 10/2004 s/p L CEA; b. 12/2015 Carotid U/S: RICA 1-39%; b. LICA patent CEA site.  Marland Kitchen GERD (gastroesophageal reflux disease)   . Hyperlipidemia   . Hypertension   . Osteoarthritis, multiple sites   . Osteopenia   . Persistent atrial fibrillation ([place])    a. Dx 08/2017; b. CHA2DS2VASc = 6-->Xarelto; c. 09/2017 Successful DCCV (second shock - 200J);   Marland Kitchen Personal history of radiation therapy 2001   left breast ca  . Psoriasis     Past Surgical History:  Procedure Laterality Date  . ABDOMINAL HYSTERECTOMY    . ANKLE FRACTURE SURGERY  4/08   left---hardware still in place  . 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 BREAST EXCISIONAL BIOPSY Left yrs ago   benign  . BREAST LUMPECTOMY Left 2001   f/u radiation  . CARDIOVERSION N/A 10/04/2017   Procedure: CARDIOVERSION;  Surgeon: Wellington Hampshire, MD;  Location: [facility] ORS;  Service: Cardiovascular;  Laterality: N/A;  . CARDIOVERSION N/A 12/16/2017    Procedure: CARDIOVERSION;  Surgeon: [name], MD;  Location: [facility] ORS;  Service: Cardiovascular;  Laterality: N/A;  . CAROTID ENDARTERECTOMY Left 10/23/2004  . FRACTURE SURGERY    . OOPHORECTOMY    . SHOULDER SURGERY  6/07   left  . TONSILLECTOMY AND ADENOIDECTOMY    . TOTAL HIP ARTHROPLASTY  2004   right    There were no vitals filed for this visit.  Subjective Assessment - 10/09/19 1400    Subjective  Patient reports she feels her hip has not been improving. Patient states no major changes overall.    Pertinent History  Patient previously seen for balance difficulties, shoulder pain and the right, and low back pain.  Patient reports no falls in the past six months. Patient reports pain has not improved for the past month and has been about the same since the onset of pain.    Limitations  Sitting;Standing;Walking    Patient Stated Goals  To improve balance when walking, driving pain    Currently in Pain?  Yes    Pain Score  4     Pain Location  Hip    Pain Orientation  Right    Pain Descriptors / Indicators  Aching    Pain Type  Acute pain    Pain Onset  More than a month ago    Pain Frequency  Intermittent       TREATMENT Therapeutic Exercise  LTRs - x 1.5 min Step ups 4" - 2 x 15  Assisted hip flexion in supine with therapist support with patient in supine - x 10  Hip ER/IR single leg in standing - x 10  Hip extension leg extension - x 10 in standing Hip abduction in standing - x 10 B AAROM hip flexion in supine - x 10  Performed exercises to address hip flexor weakness and hip stabilization    PT Education - 10/09/19 1402    Education Details  form/technique with exercise; hip flexion with towel reinforced to HEP    Person(s) Educated  Patient    Methods  Explanation;Demonstration    Comprehension  Verbalized understanding;Returned demonstration       PT Short Term Goals - 09/18/19 1414      PT SHORT TERM GOAL #1   Title  Patient will have a worst pain  score of 6/10 to better be able to drive with less overall pain    Baseline  8/10    Time  3    Period  Weeks    Status  New    Target Date  10/09/19        PT Long Term Goals - 09/18/19 1414      PT LONG TERM GOAL #1   Title  Patient will be independent with HEP to continue benefits of therapy after discharge.     Baseline  Dependent with form and technique for exercises    Time  6    Period  Weeks    Status  New    Target Date  10/30/19      PT LONG TERM GOAL #2   Title  Patient will improve 66mWT to over 1 m/s to indicate increased safety with ambulating in a community environment.    Baseline  .66 m/s    Time  6    Period  Weeks    Status  New    Target Date  10/30/19      PT LONG TERM GOAL #3   Title  Patient will improve FGA to 63 to indicate significant functional improvement of R hip function.    Baseline  FGA: 44    Time  6    Period  Weeks    Status  New    Target Date  10/30/19      PT LONG TERM GOAL #4   Title  Patient will be able to balance for 10sec with SLS to improve static balance and ability to get dressed when in standing    Baseline  2.5 sec B    Time  6    Period  Weeks    Status  New    Target Date  10/30/19      PT LONG TERM GOAL #5   Title  Patient will have a worst hip pain of a 3/10 over the past week to indicate functional improvement with transferring and driving    Baseline  worst pain: 8/10    Time  6    Period  Weeks    Status  New    Target Date  10/30/19            Plan - 10/10/19 0843    Clinical Impression Statement  Patient with improvement today after performing AAROM to  hip flexion indicating progression with pain and symptoms. Added greater amount of standing exercises today secondary to decreased overall symptoms, patient tolerates these well however continues to have difficulties with ambulating. Patient will benefit from further skilled therapy focused on improving these limitations to return to prior level of  function.    Personal Factors and Comorbidities  Age;Comorbidity 2    Comorbidities  hip fx, RA    Examination-Activity Limitations  Lift;Squat;Stand;Transfers    Examination-Participation Restrictions  Driving    Stability/Clinical Decision Making  Evolving/Moderate complexity    Rehab Potential  Good    PT Frequency  2x / week    PT Duration  6 weeks    PT Treatment/Interventions  Neuromuscular re-education;Stair training;Moist Heat;Cryotherapy;Iontophoresis 4mg /ml Dexamethasone;Electrical Stimulation;DME Instruction;Therapeutic activities;Therapeutic exercise;Balance training;Manual techniques;Patient/family education;Passive range of motion;Dry needling;Joint Manipulations    PT Next Visit Plan  progress hip strengthening, use of SPC    PT Home Exercise Plan  See education section    Consulted and Agree with Plan of Care  Patient       Patient will benefit from skilled therapeutic intervention in order to improve the following deficits and impairments:  Abnormal gait, Decreased activity tolerance, Decreased endurance, Decreased range of motion, Decreased strength, Decreased balance, Difficulty walking, Decreased cognition, Pain, Decreased coordination, Increased muscle spasms, Postural dysfunction, Hypomobility  Visit Diagnosis: History of falling  Unsteadiness on feet  Pain in right hip     Problem List Patient Active Problem List   Diagnosis Date Noted  . Closed right hip fracture ([place]) 09/20/2019  . Gout 05/18/2019  . Clavi 03/09/2019  . Right hip pain 07/08/2018  . Chronic bilateral low back pain without sciatica 12/31/2017  . Atrial fibrillation ([place]) 09/10/2017  . Trapezius strain, right, initial encounter 09/10/2017  . Depression, major, single episode, mild ([place]) 09/10/2017  . Chronic venous insufficiency 09/09/2017  . Lymphedema 09/09/2017  . Pain of toe of left foot 08/17/2017  . Leg swelling 07/31/2017  . Dry eyes 07/06/2017  . Pain and swelling of lower  extremity, right 03/29/2017  . Right shoulder pain 03/29/2017  . Diarrhea 05/18/2016  . Fall 05/01/2016  . Leg weakness, bilateral 12/30/2015  . Macrocytic anemia 05/19/2013  . Psoriatic arthritis ([place]) 02/03/2013  . Right hand pain 08/05/2012  . Psoriasis   . Osteoarthritis, multiple sites   . History of breast cancer 10/28/2011  . GERD (gastroesophageal reflux disease)   . Hypertension   . Osteopenia   . Carotid stenosis 05/27/2011    Blythe Stanford, PT DPT 10/10/2019, 8:45 AM  [facility] [address], [place], [address] Phone: [phone]   Fax:  [phone]  Name: [name] MRN: [id number] Date of Birth: 08/10/1940

## 2019-10-10 NOTE — Progress Notes (Signed)
HISTORY AND PHYSICAL     CC:  follow up. Requesting Provider:  [name], MD  HPI: This is a 79 y.o. female here for follow up for carotid artery stenosis.  Pt is s/p left CEA by Dr. Scot Harris on 10/23/2004.  Pt was last seen 08/09/2018 and at that time, she was doing well.  She was having some right hand clumsiness that she attributes to arthritis.  Her velocities were on the low end of the 40-59% on the right.    She has hx of afib and on Pradaxa.   She presents today for follow up.  She states she is doing quite well.  She states that back in December, she fell and broke her left hip & she has rehabbed nicely.    She denies any amaurosis fugax, weakness, speech difficulties or paralysis.  She continues to have clumsiness of the right hand, but she has severe arthritis of that hand and contributes it to this.  This is unchanged from her last visit.    She is not on a statin b/c her cholesterol numbers are good.  She is not on an aspirin bc she is on Pradaxa and they had taken her off of aspirin in the past due to bleeding.   She states her legs really do not bother her.  She does not wear compression stockings.  She cannot get them on due to her arthritis.    The pt is not on a statin for cholesterol management.  The pt is not on a daily aspirin.   Other AC:  Pradaxa for afib The pt is on BB for hypertension.   The pt is not diabetic.   Tobacco hx:  Former-quit 1997   Past Medical History:  Diagnosis Date  . (HFpEF) heart failure with preserved ejection fraction ([place])    a. 08/2017 Echo: EF 55-60%, no rwma, mild MR, mildly dil LA, nl RV fxn.  . Breast cancer ([place]) 2001   left breast  . Cancer ([place]) 2001   left breast ca  . Carotid arterial disease ([place])    a. 10/2004 s/p L CEA; b. 12/2015 Carotid U/S: RICA 1-39%; b. LICA patent CEA site.  Marland Kitchen GERD (gastroesophageal reflux disease)   . Hyperlipidemia   . Hypertension   . Osteoarthritis, multiple sites   . Osteopenia   .  Persistent atrial fibrillation ([place])    a. Dx 08/2017; b. CHA2DS2VASc = 6-->Xarelto; c. 09/2017 Successful DCCV (second shock - 200J);   Marland Kitchen Personal history of radiation therapy 2001   left breast ca  . Psoriasis     Past Surgical History:  Procedure Laterality Date  . ABDOMINAL HYSTERECTOMY    . ANKLE FRACTURE SURGERY  4/08   left---hardware still in place  . 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 BREAST EXCISIONAL BIOPSY Left yrs ago   benign  . BREAST LUMPECTOMY Left 2001   f/u radiation  . CARDIOVERSION N/A 10/04/2017   Procedure: CARDIOVERSION;  Surgeon: Dana Hampshire, MD;  Location: [facility] ORS;  Service: Cardiovascular;  Laterality: N/A;  . CARDIOVERSION N/A 12/16/2017   Procedure: CARDIOVERSION;  Surgeon: Dana Sprang, MD;  Location: [facility] ORS;  Service: Cardiovascular;  Laterality: N/A;  . CAROTID ENDARTERECTOMY Left 10/23/2004  . FRACTURE SURGERY    . OOPHORECTOMY    . SHOULDER SURGERY  6/07   left  . TONSILLECTOMY AND ADENOIDECTOMY    . TOTAL HIP ARTHROPLASTY  2004   right  No Known Allergies  Current Outpatient Medications  Medication Sig Dispense Refill  . allopurinol (ZYLOPRIM) 100 MG tablet Take 100 mg by mouth daily.    . Cholecalciferol (VITAMIN D3) 1000 units CAPS Take 1 capsule by mouth 2 (two) times daily.     . dabigatran (PRADAXA) 150 MG CAPS capsule Take 1 capsule (150 mg total) by mouth 2 (two) times daily. 180 capsule 3  . flecainide (TAMBOCOR) 50 MG tablet TAKE 1 TABLET BY MOUTH TWICE A DAY 180 tablet 1  . furosemide (LASIX) 40 MG tablet TAKE 1 TABLET BY MOUTH EVERY DAY 90 tablet 1  . LORazepam (ATIVAN) 0.5 MG tablet TAKE 1 TABLET BY MOUTH TWICE A DAY AS NEEDED FOR ANXIETY 60 tablet 0  . metoprolol tartrate (LOPRESSOR) 50 MG tablet Take 1/2 tablet (25 mg) by mouth twice daily 90 tablet 1  . Multiple Vitamin (MULTIVITAMIN) capsule Take 1 capsule by mouth daily.      . Multiple Vitamins-Minerals (PRESERVISION AREDS 2) CAPS Take 1 tablet by mouth 2  (two) times daily.    Marland Kitchen oxyCODONE-acetaminophen (PERCOCET) 5-325 MG tablet Take 1 tablet by mouth every 6 (six) hours as needed for severe pain. 20 tablet 0   No current facility-administered medications for this visit.    Family History  Problem Relation Age of Onset  . Hypertension Brother   . Pneumonia Mother   . Leukemia Father   . Heart disease Neg Hx   . Diabetes Neg Hx     Social History   Socioeconomic History  . Marital status: Widowed    Spouse name: Not on file  . Number of children: Not on file  . Years of education: Not on file  . Highest education level: Not on file  Occupational History  . Occupation: Pharmacologist    Comment: Retired  Tobacco Use  . Smoking status: Former Smoker    Packs/day: 1.00    Years: 40.00    Pack years: 40.00    Types: Cigarettes    Quit date: 07/21/1995    Years since quitting: 24.2  . Smokeless tobacco: Never Used  Substance and Sexual Activity  . Alcohol use: Yes    Alcohol/week: 16.0 standard drinks    Types: 2 Glasses of wine, 14 Standard drinks or equivalent per week    Comment: daily 1-2  . Drug use: No  . Sexual activity: Never  Other Topics Concern  . Not on file  Social History Narrative   1 natural child   3 adopted children   Artist---still teaches water colors   Husband has Alzheimers      Has living will   Daughter [name] is health care POA   DNR    No tube feeds if cognitively unaware   Social Determinants of Health   Financial Resource Strain: Low Risk   . Difficulty of Paying Living Expenses: Not hard at all  Food Insecurity: No Food Insecurity  . Worried About Charity fundraiser in the Last Year: Never true  . Ran Out of Food in the Last Year: Never true  Transportation Needs: No Transportation Needs  . Lack of Transportation (Medical): No  . Lack of Transportation (Non-Medical): No  Physical Activity: Unknown  . Days of Exercise per Week: 0 days  . Minutes of Exercise per Session: Not on file    Stress: No Stress Concern Present  . Feeling of Stress : Not at all  Social Connections:   . Frequency of Communication with Friends  and Family:   . Frequency of Social Gatherings with Friends and Family:   . Attends Religious Services:   . Active Member of Clubs or Organizations:   . Attends Archivist Meetings:   Marland Kitchen Marital Status:   Intimate Partner Violence:   . Fear of Current or Ex-Partner:   . Emotionally Abused:   Marland Kitchen Physically Abused:   . Sexually Abused:      REVIEW OF SYSTEMS:   [X]  denotes positive finding, [ ]  denotes negative finding Cardiac  Comments:  Chest pain or chest pressure:    Shortness of breath upon exertion:    Short of breath when lying flat:    Irregular heart rhythm:        Vascular    Pain in calf, thigh, or hip brought on by ambulation:    Pain in feet at night that wakes you up from your sleep:     Blood clot in your veins:    Leg swelling:         Pulmonary    Oxygen at home:    Productive cough:     Wheezing:         Neurologic    Sudden weakness in arms or legs:     Sudden numbness in arms or legs:     Sudden onset of difficulty speaking or slurred speech:    Temporary loss of vision in one eye:     Problems with dizziness:         Gastrointestinal    Blood in stool:     Vomited blood:         Genitourinary    Burning when urinating:     Blood in urine:        Psychiatric    Major depression:         Hematologic    Bleeding problems:    Problems with blood clotting too easily:        Skin    Rashes or ulcers:        Constitutional    Fever or chills:      PHYSICAL EXAMINATION:  Today's Vitals   10/10/19 1254 10/10/19 1258  BP: (!) 166/93 (!) 179/83  Pulse: (!) 58   Resp: 16   Temp: 97.7 F (36.5 C)   TempSrc: Oral   SpO2: 99%   Weight: 114 lb 11.2 oz (52 kg)   Height: 5\' 5"  (1.651 m)    Body mass index is 19.09 kg/m.   General:  WDWN in NAD; vital signs documented above Gait: Not  observed HENT: WNL, normocephalic Pulmonary: normal non-labored breathing , without Rales, rhonchi,  wheezing Cardiac: irregular HR, without  Murmurs, rubs or gallops; without carotid bruits Abdomen: soft, NT, no masses Skin: without rashes Vascular Exam/Pulses:  Right Left  Radial 2+ (normal) 2+ (normal)  Ulnar Unable to palpate  1+ (weak)  Popliteal Unable to palpate  Unable to palpate  AT 1+ (weak) 1+ (weak)  PT Unable to palpate  Unable to palpate    Extremities: without ischemic changes, without Gangrene , without cellulitis; without open wounds; some varicosities present BLE Musculoskeletal: no muscle wasting or atrophy  Neurologic: A&O X 3 Psychiatric:  The pt has Normal affect.   Non-Invasive Vascular Imaging:   Carotid Duplex on 10/10/2019: Right:  1-39% ICA stenosis Left:  1-39% ICA stenosis Vertebrals: Right vertebral artery demonstrates antegrade flow. Left  vertebral artery demonstrates retrograde flow. Left vertebral artery   demonstrates high resistant  flow.  Subclavians: Left subclavian artery was stenotic. Normal flow hemodynamics were seen in the right subclavian artery.   Previous Carotid duplex on 08/09/2018: Right: 1-39% ICA stenosis Left:   40-59% ICA stenosis Elevated diastolic velocity may be due to tortuosity. Vertebrals: Right vertebral artery demonstrates antegrade flow. Left  vertebral  artery demonstrates retrograde flow. Left vertebral artery  demonstrates high resistant flow.  Subclavians: Left subclavian artery was stenotic. Right subclavian artery  flow was disturbed.   ASSESSMENT/PLAN:: 79 y.o. female here for follow up carotid artery stenosis.  She is left CEA by Dr. Scot Harris on 10/23/2004.   -pt doing well and remains asymptomatic.  She continues to have some clumsiness of the right hand due to her severe arthritis, but this is unchanged from last year.  Her duplex is essentially unchanged and velocities on the left actually a little  better.   -discussed s/s of stroke with pt and they understand should they develop any of these sx, they will go to the nearest ER. -she will f/u in one year with carotid duplex.   [name], [facility] [phone]  Clinic MD:  Early

## 2019-10-12 ENCOUNTER — Ambulatory Visit: Payer: Medicare Other

## 2019-10-12 ENCOUNTER — Other Ambulatory Visit: Payer: Self-pay | Admitting: *Deleted

## 2019-10-12 DIAGNOSIS — I6523 Occlusion and stenosis of bilateral carotid arteries: Secondary | ICD-10-CM

## 2019-10-16 ENCOUNTER — Encounter: Payer: Self-pay | Admitting: Podiatry

## 2019-10-16 ENCOUNTER — Other Ambulatory Visit: Payer: Self-pay

## 2019-10-16 ENCOUNTER — Ambulatory Visit (INDEPENDENT_AMBULATORY_CARE_PROVIDER_SITE_OTHER): Payer: Medicare Other | Admitting: Podiatry

## 2019-10-16 ENCOUNTER — Ambulatory Visit: Payer: Medicare Other

## 2019-10-16 DIAGNOSIS — R2681 Unsteadiness on feet: Secondary | ICD-10-CM | POA: Diagnosis not present

## 2019-10-16 DIAGNOSIS — B351 Tinea unguium: Secondary | ICD-10-CM

## 2019-10-16 DIAGNOSIS — Z9181 History of falling: Secondary | ICD-10-CM

## 2019-10-16 DIAGNOSIS — M79676 Pain in unspecified toe(s): Secondary | ICD-10-CM | POA: Diagnosis not present

## 2019-10-16 DIAGNOSIS — M25551 Pain in right hip: Secondary | ICD-10-CM | POA: Diagnosis not present

## 2019-10-16 NOTE — Progress Notes (Signed)
This patient returns to my office for at risk foot care.  This patient requires this care by a professional since this patient will be at risk due to having lymphedema.  This patient is unable to cut nails herself since the patient cannot reach her nails.These nails are painful walking and wearing shoes.  This patient presents for at risk foot care today.  General Appearance  Alert, conversant and in no acute stress.  Vascular  Dorsalis pedis and posterior tibial  pulses are palpable  bilaterally.  Capillary return is within normal limits  bilaterally. Temperature is within normal limits  bilaterally.  Neurologic  Senn-Weinstein monofilament wire test within normal limits  bilaterally. Muscle power within normal limits bilaterally.  Nails Thick disfigured discolored nails with subungual debris  from hallux to fifth toes bilaterally. No evidence of bacterial infection or drainage bilaterally.  Orthopedic  No limitations of motion  feet .  No crepitus or effusions noted.  No bony pathology or digital deformities noted.  HAV  B/L.  Mallet toe third toe left foot.  Skin  normotropic skin with no porokeratosis noted bilaterally.  No signs of infections or ulcers noted.     Onychomycosis  Pain in right toes  Pain in left toes  Consent was obtained for treatment procedures.   Mechanical debridement of nails 1-5  bilaterally performed with a nail nipper.  Filed with dremel without incident.  Gave her information for purchasing crest pad left foot.   Return office visit    3 months                  Told patient to return for periodic foot care and evaluation due to potential at risk complications.   Gardiner Barefoot DPM

## 2019-10-16 NOTE — Therapy (Signed)
[facility] [facility] [address] [place], [address] Phone: [phone]   Fax:  [phone]  Physical Therapy Treatment  Patient Details  Name: [name] MRN: [id number] Date of Birth: 17-Dec-1940 Referring Provider (PT): Tamala Julian   Encounter Date: 10/16/2019  PT End of Session - 10/16/19 1507    Visit Number  7    Number of Visits  13    Date for PT Re-Evaluation  10/30/19    Authorization Type  7/10 Medicare    PT Start Time  1345    PT Stop Time  1430    PT Time Calculation (min)  45 min    Activity Tolerance  Patient tolerated treatment well    Behavior During Therapy  [facility] for tasks assessed/performed       Past Medical History:  Diagnosis Date  . (HFpEF) heart failure with preserved ejection fraction ([place])    a. 08/2017 Echo: EF 55-60%, no rwma, mild MR, mildly dil LA, nl RV fxn.  . Breast cancer ([place]) 2001   left breast  . Cancer ([place]) 2001   left breast ca  . Carotid arterial disease ([place])    a. 10/2004 s/p L CEA; b. 12/2015 Carotid U/S: RICA 1-39%; b. LICA patent CEA site.  Marland Kitchen GERD (gastroesophageal reflux disease)   . Hyperlipidemia   . Hypertension   . Osteoarthritis, multiple sites   . Osteopenia   . Persistent atrial fibrillation ([place])    a. Dx 08/2017; b. CHA2DS2VASc = 6-->Xarelto; c. 09/2017 Successful DCCV (second shock - 200J);   Marland Kitchen Personal history of radiation therapy 2001   left breast ca  . Psoriasis     Past Surgical History:  Procedure Laterality Date  . ABDOMINAL HYSTERECTOMY    . ANKLE FRACTURE SURGERY  4/08   left---hardware still in place  . 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 BREAST EXCISIONAL BIOPSY Left yrs ago   benign  . BREAST LUMPECTOMY Left 2001   f/u radiation  . CARDIOVERSION N/A 10/04/2017   Procedure: CARDIOVERSION;  Surgeon: Wellington Hampshire, MD;  Location: [facility] ORS;  Service: Cardiovascular;  Laterality: N/A;  . CARDIOVERSION N/A 12/16/2017    Procedure: CARDIOVERSION;  Surgeon: [name], MD;  Location: [facility] ORS;  Service: Cardiovascular;  Laterality: N/A;  . CAROTID ENDARTERECTOMY Left 10/23/2004  . FRACTURE SURGERY    . OOPHORECTOMY    . SHOULDER SURGERY  6/07   left  . TONSILLECTOMY AND ADENOIDECTOMY    . TOTAL HIP ARTHROPLASTY  2004   right    There were no vitals filed for this visit.  Subjective Assessment - 10/16/19 1502    Subjective  Patient states increased pain with walking. Patient states no major changes with increased pain along the side of her hip.    Pertinent History  Patient previously seen for balance difficulties, shoulder pain and the right, and low back pain.  Patient reports no falls in the past six months. Patient reports pain has not improved for the past month and has been about the same since the onset of pain.    Limitations  Sitting;Standing;Walking    Patient Stated Goals  To improve balance when walking, driving pain    Currently in Pain?  Yes    Pain Score  4     Pain Location  Hip    Pain Orientation  Right    Pain Descriptors / Indicators  Aching  Pain Type  Acute pain    Pain Onset  More than a month ago    Pain Frequency  Intermittent               TREATMENT Manual therapy  STM over affected area (rectus femoris and psoas major and iliacus) with patient in supine utilizing superficial techniques to decrease increased guarding and pain. Long axis pull along the hip performed for 2 x 1 min on the R side Therapeutic exercise Hooklying bridges with GTB around knees with lifting hip 1 " off the ground - x 20 Hip abduction in supine - 2 x 10 with therapist support around the affected LE Seated hip abduction with GTB - x 20  Tandem stance in standing - 2 x 30sec Pre-gait amb focus on performing forward/backward weight shifts in standing - x 20  Performed exercises to decrease pain and improve pain and spasms in standing    PT Education - 10/16/19 1505    Education  Details  form/technique with exercise; hip abduction in sitting    Person(s) Educated  Patient    Methods  Explanation;Demonstration    Comprehension  Verbalized understanding;Returned demonstration       PT Short Term Goals - 09/18/19 1414      PT SHORT TERM GOAL #1   Title  Patient will have a worst pain score of 6/10 to better be able to drive with less overall pain    Baseline  8/10    Time  3    Period  Weeks    Status  New    Target Date  10/09/19        PT Long Term Goals - 09/18/19 1414      PT LONG TERM GOAL #1   Title  Patient will be independent with HEP to continue benefits of therapy after discharge.     Baseline  Dependent with form and technique for exercises    Time  6    Period  Weeks    Status  New    Target Date  10/30/19      PT LONG TERM GOAL #2   Title  Patient will improve 67mWT to over 1 m/s to indicate increased safety with ambulating in a community environment.    Baseline  .66 m/s    Time  6    Period  Weeks    Status  New    Target Date  10/30/19      PT LONG TERM GOAL #3   Title  Patient will improve FGA to 63 to indicate significant functional improvement of R hip function.    Baseline  FGA: 44    Time  6    Period  Weeks    Status  New    Target Date  10/30/19      PT LONG TERM GOAL #4   Title  Patient will be able to balance for 10sec with SLS to improve static balance and ability to get dressed when in standing    Baseline  2.5 sec B    Time  6    Period  Weeks    Status  New    Target Date  10/30/19      PT LONG TERM GOAL #5   Title  Patient will have a worst hip pain of a 3/10 over the past week to indicate functional improvement with transferring and driving    Baseline  worst pain: 8/10    Time  6    Period  Weeks    Status  New    Target Date  10/30/19            Plan - 10/16/19 1511    Clinical Impression Statement  Patient demonstrates less guarding along her hip flexor on the affected side. Patient  continues to have have difficulty with ambulation, however, she demosntrates improvement with the performance of STM along the affected musculature as well as performing mobility based exercises towards helping the glute med. Patient requires further physical therapy to address balance limitations and improvement towards walking overall.    Personal Factors and Comorbidities  Age;Comorbidity 2    Comorbidities  hip fx, RA    Examination-Activity Limitations  Lift;Squat;Stand;Transfers    Examination-Participation Restrictions  Driving    Stability/Clinical Decision Making  Evolving/Moderate complexity    Rehab Potential  Good    PT Frequency  2x / week    PT Duration  6 weeks    PT Treatment/Interventions  Neuromuscular re-education;Stair training;Moist Heat;Cryotherapy;Iontophoresis 4mg /ml Dexamethasone;Electrical Stimulation;DME Instruction;Therapeutic activities;Therapeutic exercise;Balance training;Manual techniques;Patient/family education;Passive range of motion;Dry needling;Joint Manipulations    PT Next Visit Plan  progress hip strengthening, use of SPC    PT Home Exercise Plan  See education section    Consulted and Agree with Plan of Care  Patient       Patient will benefit from skilled therapeutic intervention in order to improve the following deficits and impairments:  Abnormal gait, Decreased activity tolerance, Decreased endurance, Decreased range of motion, Decreased strength, Decreased balance, Difficulty walking, Decreased cognition, Pain, Decreased coordination, Increased muscle spasms, Postural dysfunction, Hypomobility  Visit Diagnosis: History of falling  Unsteadiness on feet  Pain in right hip     Problem List Patient Active Problem List   Diagnosis Date Noted  . Closed right hip fracture ([place]) 09/20/2019  . Gout 05/18/2019  . Clavi 03/09/2019  . Right hip pain 07/08/2018  . Chronic bilateral low back pain without sciatica 12/31/2017  . Atrial fibrillation  ([place]) 09/10/2017  . Trapezius strain, right, initial encounter 09/10/2017  . Depression, major, single episode, mild ([place]) 09/10/2017  . Chronic venous insufficiency 09/09/2017  . Lymphedema 09/09/2017  . Pain of toe of left foot 08/17/2017  . Leg swelling 07/31/2017  . Dry eyes 07/06/2017  . Pain and swelling of lower extremity, right 03/29/2017  . Right shoulder pain 03/29/2017  . Diarrhea 05/18/2016  . Fall 05/01/2016  . Leg weakness, bilateral 12/30/2015  . Macrocytic anemia 05/19/2013  . Psoriatic arthritis ([place]) 02/03/2013  . Right hand pain 08/05/2012  . Psoriasis   . Osteoarthritis, multiple sites   . History of breast cancer 10/28/2011  . GERD (gastroesophageal reflux disease)   . Hypertension   . Osteopenia   . Carotid stenosis 05/27/2011    Blythe Stanford, PT DPT 10/16/2019, 3:20 PM  [facility] [address], [place], [address] Phone: [phone]   Fax:  [phone]  Name: [name] MRN: [id number] Date of Birth: 01-19-1941

## 2019-10-19 ENCOUNTER — Other Ambulatory Visit: Payer: Self-pay

## 2019-10-19 ENCOUNTER — Ambulatory Visit: Payer: Medicare Other | Attending: Orthopedic Surgery

## 2019-10-19 DIAGNOSIS — Z9181 History of falling: Secondary | ICD-10-CM | POA: Diagnosis not present

## 2019-10-19 DIAGNOSIS — M545 Low back pain: Secondary | ICD-10-CM | POA: Insufficient documentation

## 2019-10-19 DIAGNOSIS — M25511 Pain in right shoulder: Secondary | ICD-10-CM | POA: Insufficient documentation

## 2019-10-19 DIAGNOSIS — M25551 Pain in right hip: Secondary | ICD-10-CM | POA: Diagnosis not present

## 2019-10-19 DIAGNOSIS — M6281 Muscle weakness (generalized): Secondary | ICD-10-CM | POA: Diagnosis not present

## 2019-10-19 DIAGNOSIS — R2681 Unsteadiness on feet: Secondary | ICD-10-CM | POA: Diagnosis not present

## 2019-10-19 NOTE — Therapy (Signed)
[place] [facility] [address], [place], [address] Phone: [phone]   Fax:  [phone]  Physical Therapy Treatment  Patient Details  Name: [name] MRN: [id number] Date of Birth: 1940-09-18 Referring Provider (PT): Tamala Julian   Encounter Date: 10/19/2019  PT End of Session - 10/19/19 1458    Visit Number  8    Number of Visits  13    Date for PT Re-Evaluation  10/30/19    Authorization Type  8/10 Medicare    PT Start Time  1430    PT Stop Time  1515    PT Time Calculation (min)  45 min    Activity Tolerance  Patient tolerated treatment well    Behavior During Therapy  [facility] for tasks assessed/performed       Past Medical History:  Diagnosis Date  . (HFpEF) heart failure with preserved ejection fraction ([place])    a. 08/2017 Echo: EF 55-60%, no rwma, mild MR, mildly dil LA, nl RV fxn.  . Breast cancer ([place]) 2001   left breast  . Cancer ([place]) 2001   left breast ca  . Carotid arterial disease ([place])    a. 10/2004 s/p L CEA; b. 12/2015 Carotid U/S: RICA 1-39%; b. LICA patent CEA site.  Marland Kitchen GERD (gastroesophageal reflux disease)   . Hyperlipidemia   . Hypertension   . Osteoarthritis, multiple sites   . Osteopenia   . Persistent atrial fibrillation ([place])    a. Dx 08/2017; b. CHA2DS2VASc = 6-->Xarelto; c. 09/2017 Successful DCCV (second shock - 200J);   Marland Kitchen Personal history of radiation therapy 2001   left breast ca  . Psoriasis     Past Surgical History:  Procedure Laterality Date  . ABDOMINAL HYSTERECTOMY    . ANKLE FRACTURE SURGERY  4/08   left---hardware still in place  . 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 BREAST EXCISIONAL BIOPSY Left yrs ago   benign  . BREAST LUMPECTOMY Left 2001   f/u radiation  . CARDIOVERSION N/A 10/04/2017   Procedure: CARDIOVERSION;  Surgeon: Wellington Hampshire, MD;  Location: [facility] ORS;  Service: Cardiovascular;  Laterality: N/A;  . CARDIOVERSION N/A 12/16/2017    Procedure: CARDIOVERSION;  Surgeon: [name], MD;  Location: [facility] ORS;  Service: Cardiovascular;  Laterality: N/A;  . CAROTID ENDARTERECTOMY Left 10/23/2004  . FRACTURE SURGERY    . OOPHORECTOMY    . SHOULDER SURGERY  6/07   left  . TONSILLECTOMY AND ADENOIDECTOMY    . TOTAL HIP ARTHROPLASTY  2004   right    There were no vitals filed for this visit.  Subjective Assessment - 10/19/19 1453    Subjective  Patient reports increased pain with walking. Reports she is worried becuase the pain is not improving and if anything it's getting worse overall. Patient states she wants to make sure nothing more serious is happening to her LE.    Pertinent History  Patient previously seen for balance difficulties, shoulder pain and the right, and low back pain.  Patient reports no falls in the past six months. Patient reports pain has not improved for the past month and has been about the same since the onset of pain.    Limitations  Sitting;Standing;Walking    Patient Stated Goals  To improve balance when walking, driving pain    Currently in Pain?  Yes    Pain Score  6     Pain Location  Hip  Pain Orientation  Right    Pain Descriptors / Indicators  Aching    Pain Type  Acute pain    Pain Onset  More than a month ago       TREATMENT Manual therapy  STM over affected area (rectus femoris and glute med iliacus) with patient in supine utilizing superficial techniques to decrease increased guarding and pain.   Therapeutic exercise Hip abduction in supine - 2 x 10 with therapist support around the affected LE Hooklying hip abduction with BTB - x 20  Prone knee flexion - x 10 10 sec holds Sidelying knee flexion/hip extension stretch Prone lying for quad stretching - x 1 min  Assessment TTP: glute med, rect fem, iliopsoas  and glute max upon paplaption Onset of pain with hip extension/knee flexion Increased pain with performing single leg SLR in supine indicating quad activation; no pain  with PROM  Performed exercises to decrease pain and improve pain and spasms in standing --  PT Education - 10/19/19 1457    Education Details  form/technique with exercise; educated on pain responses to muscular spasms and tightness.    Person(s) Educated  Patient    Methods  Explanation;Demonstration    Comprehension  Verbalized understanding;Returned demonstration       PT Short Term Goals - 09/18/19 1414      PT SHORT TERM GOAL #1   Title  Patient will have a worst pain score of 6/10 to better be able to drive with less overall pain    Baseline  8/10    Time  3    Period  Weeks    Status  New    Target Date  10/09/19        PT Long Term Goals - 09/18/19 1414      PT LONG TERM GOAL #1   Title  Patient will be independent with HEP to continue benefits of therapy after discharge.     Baseline  Dependent with form and technique for exercises    Time  6    Period  Weeks    Status  New    Target Date  10/30/19      PT LONG TERM GOAL #2   Title  Patient will improve 84mWT to over 1 m/s to indicate increased safety with ambulating in a community environment.    Baseline  .66 m/s    Time  6    Period  Weeks    Status  New    Target Date  10/30/19      PT LONG TERM GOAL #3   Title  Patient will improve FGA to 63 to indicate significant functional improvement of R hip function.    Baseline  FGA: 44    Time  6    Period  Weeks    Status  New    Target Date  10/30/19      PT LONG TERM GOAL #4   Title  Patient will be able to balance for 10sec with SLS to improve static balance and ability to get dressed when in standing    Baseline  2.5 sec B    Time  6    Period  Weeks    Status  New    Target Date  10/30/19      PT LONG TERM GOAL #5   Title  Patient will have a worst hip pain of a 3/10 over the past week to indicate functional improvement with transferring and driving  Baseline  worst pain: 8/10    Time  6    Period  Weeks    Status  New    Target Date   10/30/19            Plan - 10/19/19 1712    Clinical Impression Statement  Patient with worsened pain for today's session which has been gradually worsening over the past few days. Decrease in pain after last session after addressing muscular limitations but little carryover compared to this sessions. Increased guarding along glute med, rectus femoris/quads, and iliopsoas which is evident with palpation and increased pain over the area with active hip flexion and abduction. Patient is worried about little progress since starting physical therapy. Will continue to focus on improving these limitations to improve pain and spasms and return to prior level of function.    Personal Factors and Comorbidities  Age;Comorbidity 2    Comorbidities  hip fx, RA    Examination-Activity Limitations  Lift;Squat;Stand;Transfers    Examination-Participation Restrictions  Driving    Stability/Clinical Decision Making  Evolving/Moderate complexity    Rehab Potential  Good    PT Frequency  2x / week    PT Duration  6 weeks    PT Treatment/Interventions  Neuromuscular re-education;Stair training;Moist Heat;Cryotherapy;Iontophoresis 4mg /ml Dexamethasone;Electrical Stimulation;DME Instruction;Therapeutic activities;Therapeutic exercise;Balance training;Manual techniques;Patient/family education;Passive range of motion;Dry needling;Joint Manipulations    PT Next Visit Plan  progress hip strengthening, use of SPC    PT Home Exercise Plan  See education section    Consulted and Agree with Plan of Care  Patient       Patient will benefit from skilled therapeutic intervention in order to improve the following deficits and impairments:  Abnormal gait, Decreased activity tolerance, Decreased endurance, Decreased range of motion, Decreased strength, Decreased balance, Difficulty walking, Decreased cognition, Pain, Decreased coordination, Increased muscle spasms, Postural dysfunction, Hypomobility  Visit  Diagnosis: History of falling  Unsteadiness on feet  Pain in right hip     Problem List Patient Active Problem List   Diagnosis Date Noted  . Closed right hip fracture ([place]) 09/20/2019  . Gout 05/18/2019  . Clavi 03/09/2019  . Right hip pain 07/08/2018  . Chronic bilateral low back pain without sciatica 12/31/2017  . Atrial fibrillation ([place]) 09/10/2017  . Trapezius strain, right, initial encounter 09/10/2017  . Depression, major, single episode, mild ([place]) 09/10/2017  . Chronic venous insufficiency 09/09/2017  . Lymphedema 09/09/2017  . Pain of toe of left foot 08/17/2017  . Leg swelling 07/31/2017  . Dry eyes 07/06/2017  . Pain and swelling of lower extremity, right 03/29/2017  . Right shoulder pain 03/29/2017  . Diarrhea 05/18/2016  . Fall 05/01/2016  . Leg weakness, bilateral 12/30/2015  . Macrocytic anemia 05/19/2013  . Psoriatic arthritis ([place]) 02/03/2013  . Right hand pain 08/05/2012  . Psoriasis   . Osteoarthritis, multiple sites   . History of breast cancer 10/28/2011  . GERD (gastroesophageal reflux disease)   . Hypertension   . Osteopenia   . Carotid stenosis 05/27/2011    Blythe Stanford, PT DPT 10/19/2019, 5:17 PM  [facility] [address], [place], [address] Phone: [phone]   Fax:  [phone]  Name: ERMIE PELCHAT MRN: [id number] Date of Birth: [birth year]

## 2019-10-23 ENCOUNTER — Ambulatory Visit: Payer: Medicare Other

## 2019-10-23 ENCOUNTER — Other Ambulatory Visit: Payer: Self-pay

## 2019-10-23 DIAGNOSIS — M25511 Pain in right shoulder: Secondary | ICD-10-CM | POA: Diagnosis not present

## 2019-10-23 DIAGNOSIS — Z9181 History of falling: Secondary | ICD-10-CM | POA: Diagnosis not present

## 2019-10-23 DIAGNOSIS — R2681 Unsteadiness on feet: Secondary | ICD-10-CM

## 2019-10-23 DIAGNOSIS — M545 Low back pain: Secondary | ICD-10-CM | POA: Diagnosis not present

## 2019-10-23 DIAGNOSIS — M6281 Muscle weakness (generalized): Secondary | ICD-10-CM | POA: Diagnosis not present

## 2019-10-23 DIAGNOSIS — M25551 Pain in right hip: Secondary | ICD-10-CM | POA: Diagnosis not present

## 2019-10-23 NOTE — Therapy (Signed)
[facility] [address], [place], [address] Phone: [phone]   Fax:  [phone]  Physical Therapy Treatment  Patient Details  Name: [name] MRN: [id number] Date of Birth: [birth year] Referring Provider (PT): Tamala Julian   Encounter Date: 10/23/2019  PT End of Session - 10/23/19 1320    Visit Number  9    Number of Visits  13    Date for PT Re-Evaluation  10/30/19    Authorization Type  9/10 Medicare    PT Start Time  1300    PT Stop Time  1345    PT Time Calculation (min)  45 min    Activity Tolerance  Patient tolerated treatment well    Behavior During Therapy  [facility] for tasks assessed/performed       Past Medical History:  Diagnosis Date  . (HFpEF) heart failure with preserved ejection fraction ([place])    a. 08/2017 Echo: EF 55-60%, no rwma, mild MR, mildly dil LA, nl RV fxn.  . Breast cancer ([place]) 2001   left breast  . Cancer ([place]) 2001   left breast ca  . Carotid arterial disease ([place])    a. 10/2004 s/p L CEA; b. 12/2015 Carotid U/S: RICA 1-39%; b. LICA patent CEA site.  Marland Kitchen GERD (gastroesophageal reflux disease)   . Hyperlipidemia   . Hypertension   . Osteoarthritis, multiple sites   . Osteopenia   . Persistent atrial fibrillation ([place])    a. Dx 08/2017; b. CHA2DS2VASc = 6-->Xarelto; c. 09/2017 Successful DCCV (second shock - 200J);   Marland Kitchen Personal history of radiation therapy 2001   left breast ca  . Psoriasis     Past Surgical History:  Procedure Laterality Date  . ABDOMINAL HYSTERECTOMY    . ANKLE FRACTURE SURGERY  4/08   left---hardware still in place  . 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 BREAST EXCISIONAL BIOPSY Left yrs ago   benign  . BREAST LUMPECTOMY Left 2001   f/u radiation  . CARDIOVERSION N/A 10/04/2017   Procedure: CARDIOVERSION;  Surgeon: Wellington Hampshire, MD;  Location: [facility] ORS;  Service: Cardiovascular;  Laterality: N/A;  . CARDIOVERSION N/A 12/16/2017    Procedure: CARDIOVERSION;  Surgeon: [name], MD;  Location: [facility] ORS;  Service: Cardiovascular;  Laterality: N/A;  . CAROTID ENDARTERECTOMY Left 10/23/2004  . FRACTURE SURGERY    . OOPHORECTOMY    . SHOULDER SURGERY  6/07   left  . TONSILLECTOMY AND ADENOIDECTOMY    . TOTAL HIP ARTHROPLASTY  2004   right    There were no vitals filed for this visit.  Subjective Assessment - 10/23/19 1313    Subjective  Patient states she felt better than she did on Thursday. But reports she continues to haves increased pain.    Pertinent History  Patient previously seen for balance difficulties, shoulder pain and the right, and low back pain.  Patient reports no falls in the past six months. Patient reports pain has not improved for the past month and has been about the same since the onset of pain.    Limitations  Sitting;Standing;Walking    Patient Stated Goals  To improve balance when walking, driving pain    Currently in Pain?  Yes    Pain Score  6     Pain Location  Hip    Pain Orientation  Right    Pain Descriptors / Indicators  Aching    Pain  Type  Acute pain    Pain Onset  More than a month ago    Pain Frequency  Intermittent       TREATMENT Therapeutic Exercise Hip abduction in sitting - x 10 with 3 sec holds  Standing lumbar extension in standing arms straight - x 20 Sit to stand - x 10 Weight shifts in standing - x 20 Weight shift in standing with small marches - x 15 Side stepping with agility ladder - x 8 each direction Forward walking with agility ladder - x 8 each direction Performed exercises to improve strengthening and stabilization Gait Training Performed gait training with focus on improving gait speed, eye gaze, step length, narrow base of support performed 169ft x 5   PT Education - 10/23/19 1319    Education Details  form/technique with exercise    Person(s) Educated  Patient    Methods  Explanation;Demonstration    Comprehension  Verbalized  understanding;Returned demonstration       PT Short Term Goals - 09/18/19 1414      PT SHORT TERM GOAL #1   Title  Patient will have a worst pain score of 6/10 to better be able to drive with less overall pain    Baseline  8/10    Time  3    Period  Weeks    Status  New    Target Date  10/09/19        PT Long Term Goals - 09/18/19 1414      PT LONG TERM GOAL #1   Title  Patient will be independent with HEP to continue benefits of therapy after discharge.     Baseline  Dependent with form and technique for exercises    Time  6    Period  Weeks    Status  New    Target Date  10/30/19      PT LONG TERM GOAL #2   Title  Patient will improve 77mWT to over 1 m/s to indicate increased safety with ambulating in a community environment.    Baseline  .66 m/s    Time  6    Period  Weeks    Status  New    Target Date  10/30/19      PT LONG TERM GOAL #3   Title  Patient will improve FGA to 63 to indicate significant functional improvement of R hip function.    Baseline  FGA: 44    Time  6    Period  Weeks    Status  New    Target Date  10/30/19      PT LONG TERM GOAL #4   Title  Patient will be able to balance for 10sec with SLS to improve static balance and ability to get dressed when in standing    Baseline  2.5 sec B    Time  6    Period  Weeks    Status  New    Target Date  10/30/19      PT LONG TERM GOAL #5   Title  Patient will have a worst hip pain of a 3/10 over the past week to indicate functional improvement with transferring and driving    Baseline  worst pain: 8/10    Time  6    Period  Weeks    Status  New    Target Date  10/30/19            Plan - 10/23/19 1355  Clinical Impression Statement  Patient demonstrates improvemet with walking singificantly after this session. Patient continues to demonstrate improvement with decreased muscular guarding overall; most notably with performing hip abduction in sitting for 5 sec indicating glute med  invovlement. Patient demonstrates improvement overall and will benefit from further skilled therapy to return to prior level of function.    Personal Factors and Comorbidities  Age;Comorbidity 2    Comorbidities  hip fx, RA    Examination-Activity Limitations  Lift;Squat;Stand;Transfers    Examination-Participation Restrictions  Driving    Stability/Clinical Decision Making  Evolving/Moderate complexity    Rehab Potential  Good    PT Frequency  2x / week    PT Duration  6 weeks    PT Treatment/Interventions  Neuromuscular re-education;Stair training;Moist Heat;Cryotherapy;Iontophoresis 4mg /ml Dexamethasone;Electrical Stimulation;DME Instruction;Therapeutic activities;Therapeutic exercise;Balance training;Manual techniques;Patient/family education;Passive range of motion;Dry needling;Joint Manipulations    PT Next Visit Plan  progress hip strengthening, use of SPC    PT Home Exercise Plan  See education section    Consulted and Agree with Plan of Care  Patient       Patient will benefit from skilled therapeutic intervention in order to improve the following deficits and impairments:  Abnormal gait, Decreased activity tolerance, Decreased endurance, Decreased range of motion, Decreased strength, Decreased balance, Difficulty walking, Decreased cognition, Pain, Decreased coordination, Increased muscle spasms, Postural dysfunction, Hypomobility  Visit Diagnosis: History of falling  Unsteadiness on feet     Problem List Patient Active Problem List   Diagnosis Date Noted  . Closed right hip fracture ([place]) 09/20/2019  . Gout 05/18/2019  . Clavi 03/09/2019  . Right hip pain 07/08/2018  . Chronic bilateral low back pain without sciatica 12/31/2017  . Atrial fibrillation ([place]) 09/10/2017  . Trapezius strain, right, initial encounter 09/10/2017  . Depression, major, single episode, mild ([place]) 09/10/2017  . Chronic venous insufficiency 09/09/2017  . Lymphedema 09/09/2017  . Pain of toe of  left foot 08/17/2017  . Leg swelling 07/31/2017  . Dry eyes 07/06/2017  . Pain and swelling of lower extremity, right 03/29/2017  . Right shoulder pain 03/29/2017  . Diarrhea 05/18/2016  . Fall 05/01/2016  . Leg weakness, bilateral 12/30/2015  . Macrocytic anemia 05/19/2013  . Psoriatic arthritis ([place]) 02/03/2013  . Right hand pain 08/05/2012  . Psoriasis   . Osteoarthritis, multiple sites   . History of breast cancer 10/28/2011  . GERD (gastroesophageal reflux disease)   . Hypertension   . Osteopenia   . Carotid stenosis 05/27/2011    Blythe Stanford, PT DPT 10/23/2019, 2:10 PM  [facility] [address], [place], [address] Phone: [phone]   Fax:  [phone]  Name: [name] MRN: [id number] Date of Birth: March 28, 1941

## 2019-10-26 ENCOUNTER — Ambulatory Visit: Payer: Medicare Other

## 2019-10-26 ENCOUNTER — Other Ambulatory Visit: Payer: Self-pay

## 2019-10-26 DIAGNOSIS — R2681 Unsteadiness on feet: Secondary | ICD-10-CM | POA: Diagnosis not present

## 2019-10-26 DIAGNOSIS — M545 Low back pain: Secondary | ICD-10-CM | POA: Diagnosis not present

## 2019-10-26 DIAGNOSIS — M25511 Pain in right shoulder: Secondary | ICD-10-CM | POA: Diagnosis not present

## 2019-10-26 DIAGNOSIS — M25551 Pain in right hip: Secondary | ICD-10-CM

## 2019-10-26 DIAGNOSIS — Z9181 History of falling: Secondary | ICD-10-CM

## 2019-10-26 DIAGNOSIS — M6281 Muscle weakness (generalized): Secondary | ICD-10-CM | POA: Diagnosis not present

## 2019-10-26 NOTE — Therapy (Signed)
[facility] [address], [place], [address] Phone: [phone]   Fax:  [phone]  Physical Therapy Treatment  Patient Details  Name: [name] MRN: [id number] Date of Birth: 21-Jul-1940 Referring Provider (PT): Tamala Julian   Encounter Date: 10/26/2019  PT End of Session - 10/26/19 1539    Visit Number  10    Number of Visits  13    Date for PT Re-Evaluation  10/30/19    Authorization Type  10/10 Medicare    PT Start Time  1330    PT Stop Time  1415    PT Time Calculation (min)  45 min    Activity Tolerance  Patient tolerated treatment well    Behavior During Therapy  [facility] for tasks assessed/performed       Past Medical History:  Diagnosis Date  . (HFpEF) heart failure with preserved ejection fraction ([place])    a. 08/2017 Echo: EF 55-60%, no rwma, mild MR, mildly dil LA, nl RV fxn.  . Breast cancer ([place]) 2001   left breast  . Cancer ([place]) 2001   left breast ca  . Carotid arterial disease ([place])    a. 10/2004 s/p L CEA; b. 12/2015 Carotid U/S: RICA 1-39%; b. LICA patent CEA site.  Marland Kitchen GERD (gastroesophageal reflux disease)   . Hyperlipidemia   . Hypertension   . Osteoarthritis, multiple sites   . Osteopenia   . Persistent atrial fibrillation ([place])    a. Dx 08/2017; b. CHA2DS2VASc = 6-->Xarelto; c. 09/2017 Successful DCCV (second shock - 200J);   Marland Kitchen Personal history of radiation therapy 2001   left breast ca  . Psoriasis     Past Surgical History:  Procedure Laterality Date  . ABDOMINAL HYSTERECTOMY    . ANKLE FRACTURE SURGERY  4/08   left---hardware still in place  . 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 BREAST EXCISIONAL BIOPSY Left yrs ago   benign  . BREAST LUMPECTOMY Left 2001   f/u radiation  . CARDIOVERSION N/A 10/04/2017   Procedure: CARDIOVERSION;  Surgeon: Wellington Hampshire, MD;  Location: [facility] ORS;  Service: Cardiovascular;  Laterality: N/A;  . CARDIOVERSION N/A 12/16/2017    Procedure: CARDIOVERSION;  Surgeon: [name], MD;  Location: [facility] ORS;  Service: Cardiovascular;  Laterality: N/A;  . CAROTID ENDARTERECTOMY Left 10/23/2004  . FRACTURE SURGERY    . OOPHORECTOMY    . SHOULDER SURGERY  6/07   left  . TONSILLECTOMY AND ADENOIDECTOMY    . TOTAL HIP ARTHROPLASTY  2004   right    There were no vitals filed for this visit.  Subjective Assessment - 10/26/19 1339    Subjective  Patient reports her hip has been feeling better and has been able to walk easier compared to previous sessions.    Pertinent History  Patient previously seen for balance difficulties, shoulder pain and the right, and low back pain.  Patient reports no falls in the past six months. Patient reports pain has not improved for the past month and has been about the same since the onset of pain.    Limitations  Sitting;Standing;Walking    Patient Stated Goals  To improve balance when walking, driving pain    Currently in Pain?  Yes    Pain Score  2     Pain Location  Hip    Pain Orientation  Right    Pain Type  Acute pain    Pain  Onset  More than a month ago    Pain Frequency  Intermittent          TREATMENT  Therapeutic Exercise  Side stepping with UE support with therapist - 83ft x 8  Single leg stance with UE support - 3 x 30sec B  Marches in standing with intermittent UE support - x 25  Sit to stand - x 10 23.5"  Weight shifts in standing - x 20 forward/backward in semi-tandem position  Standing lumbar extension in standing arms straight - x 20  Backwards amb - x35ft  Forward amb EC - x21ft  Tandem Amb  -- x59ft     Performed exercises to improve strengthening and stabilization     PT Education - 10/26/19 1538    Education Details  form/technique with exercise; SLS at home with UE support    Person(s) Educated  Patient    Methods  Demonstration;Explanation    Comprehension  Verbalized understanding;Returned demonstration       PT Short Term Goals -  10/26/19 1540      PT SHORT TERM GOAL #1   Title  Patient will have a worst pain score of 6/10 to better be able to drive with less overall pain    Baseline  8/10; 10/26/2019: 6/10    Time  3    Period  Weeks    Status  On-going    Target Date  10/09/19        PT Long Term Goals - 10/26/19 1541      PT LONG TERM GOAL #1   Title  Patient will be independent with HEP to continue benefits of therapy after discharge.     Baseline  Dependent with form and technique for exercises; 10/26/2019: cueing needed to complete HEP    Time  6    Period  Weeks    Status  On-going      PT LONG TERM GOAL #2   Title  Patient will improve 31mWT to over 1 m/s to indicate increased safety with ambulating in a community environment.    Baseline  .66 m/s; 10/26/2019: .75 m/s    Time  6    Period  Weeks    Status  On-going      PT LONG TERM GOAL #3   Title  Patient will improve FGA to 63 to indicate significant functional improvement of R hip function.    Baseline  FGA: 44; 10/26/2019: FGA: 48    Time  6    Period  Weeks    Status  On-going      PT LONG TERM GOAL #4   Title  Patient will be able to balance for 10sec with SLS to improve static balance and ability to get dressed when in standing    Baseline  2.5 sec B; 10/26/2019: 2.5 sec    Time  6    Period  Weeks    Status  On-going      PT LONG TERM GOAL #5   Title  Patient will have a worst hip pain of a 3/10 over the past week to indicate functional improvement with transferring and driving    Baseline  worst pain: 8/10; 10/26/2019: 6/10    Time  6    Period  Weeks    Status  On-going            Plan - 10/26/19 2328    Clinical Impression Statement  Patient is making progress towards long term goals,  with improvement in gait speed, overall worst pain score and ability to perform better with the FGA. Although patient is improving she continues to have increased pain, difficulty with walking, and poor performance with SLS. Patient will  benefit from further skilled therapy focused on improving these limitations to return to prior level of function.    Personal Factors and Comorbidities  Age;Comorbidity 2    Comorbidities  hip fx, RA    Examination-Activity Limitations  Lift;Squat;Stand;Transfers    Examination-Participation Restrictions  Driving    Stability/Clinical Decision Making  Evolving/Moderate complexity    Rehab Potential  Good    PT Frequency  2x / week    PT Duration  6 weeks    PT Treatment/Interventions  Neuromuscular re-education;Stair training;Moist Heat;Cryotherapy;Iontophoresis 4mg /ml Dexamethasone;Electrical Stimulation;DME Instruction;Therapeutic activities;Therapeutic exercise;Balance training;Manual techniques;Patient/family education;Passive range of motion;Dry needling;Joint Manipulations    PT Next Visit Plan  progress hip strengthening, use of SPC    PT Home Exercise Plan  See education section    Consulted and Agree with Plan of Care  Patient       Patient will benefit from skilled therapeutic intervention in order to improve the following deficits and impairments:  Abnormal gait, Decreased activity tolerance, Decreased endurance, Decreased range of motion, Decreased strength, Decreased balance, Difficulty walking, Decreased cognition, Pain, Decreased coordination, Increased muscle spasms, Postural dysfunction, Hypomobility  Visit Diagnosis: Unsteadiness on feet  History of falling  Pain in right hip     Problem List Patient Active Problem List   Diagnosis Date Noted  . Closed right hip fracture ([place]) 09/20/2019  . Gout 05/18/2019  . Clavi 03/09/2019  . Right hip pain 07/08/2018  . Chronic bilateral low back pain without sciatica 12/31/2017  . Atrial fibrillation ([place]) 09/10/2017  . Trapezius strain, right, initial encounter 09/10/2017  . Depression, major, single episode, mild ([place]) 09/10/2017  . Chronic venous insufficiency 09/09/2017  . Lymphedema 09/09/2017  . Pain of toe of  left foot 08/17/2017  . Leg swelling 07/31/2017  . Dry eyes 07/06/2017  . Pain and swelling of lower extremity, right 03/29/2017  . Right shoulder pain 03/29/2017  . Diarrhea 05/18/2016  . Fall 05/01/2016  . Leg weakness, bilateral 12/30/2015  . Macrocytic anemia 05/19/2013  . Psoriatic arthritis ([place]) 02/03/2013  . Right hand pain 08/05/2012  . Psoriasis   . Osteoarthritis, multiple sites   . History of breast cancer 10/28/2011  . GERD (gastroesophageal reflux disease)   . Hypertension   . Osteopenia   . Carotid stenosis 05/27/2011    Blythe Stanford, PT DPT 10/26/2019, 11:33 PM  [facility] [address], [place], [address] Phone: [phone]   Fax:  [phone]  Name: [name] MRN: [id number] Date of Birth: 1941/05/12

## 2019-10-30 ENCOUNTER — Other Ambulatory Visit: Payer: Self-pay

## 2019-10-30 ENCOUNTER — Ambulatory Visit: Payer: Medicare Other

## 2019-10-30 DIAGNOSIS — M25511 Pain in right shoulder: Secondary | ICD-10-CM | POA: Diagnosis not present

## 2019-10-30 DIAGNOSIS — R2681 Unsteadiness on feet: Secondary | ICD-10-CM | POA: Diagnosis not present

## 2019-10-30 DIAGNOSIS — M25551 Pain in right hip: Secondary | ICD-10-CM

## 2019-10-30 DIAGNOSIS — Z9181 History of falling: Secondary | ICD-10-CM

## 2019-10-30 DIAGNOSIS — M6281 Muscle weakness (generalized): Secondary | ICD-10-CM | POA: Diagnosis not present

## 2019-10-30 DIAGNOSIS — M545 Low back pain: Secondary | ICD-10-CM | POA: Diagnosis not present

## 2019-10-30 NOTE — Therapy (Signed)
[facility] [address], [place], [address] Phone: [phone]   Fax:  [phone]  Physical Therapy Treatment  Patient Details  Name: [name] MRN: [id number] Date of Birth: 1940-08-16 Referring Provider (PT): Tamala Julian   Encounter Date: 10/30/2019  PT End of Session - 10/30/19 1306    Visit Number  11    Number of Visits  13    Date for PT Re-Evaluation  10/30/19    Authorization Type  1/10 Medicare    PT Start Time  1300    PT Stop Time  1345    PT Time Calculation (min)  45 min    Activity Tolerance  Patient tolerated treatment well    Behavior During Therapy  [facility] for tasks assessed/performed       Past Medical History:  Diagnosis Date  . (HFpEF) heart failure with preserved ejection fraction ([place])    a. 08/2017 Echo: EF 55-60%, no rwma, mild MR, mildly dil LA, nl RV fxn.  . Breast cancer ([place]) 2001   left breast  . Cancer ([place]) 2001   left breast ca  . Carotid arterial disease ([place])    a. 10/2004 s/p L CEA; b. 12/2015 Carotid U/S: RICA 1-39%; b. LICA patent CEA site.  Marland Kitchen GERD (gastroesophageal reflux disease)   . Hyperlipidemia   . Hypertension   . Osteoarthritis, multiple sites   . Osteopenia   . Persistent atrial fibrillation ([place])    a. Dx 08/2017; b. CHA2DS2VASc = 6-->Xarelto; c. 09/2017 Successful DCCV (second shock - 200J);   Marland Kitchen Personal history of radiation therapy 2001   left breast ca  . Psoriasis     Past Surgical History:  Procedure Laterality Date  . ABDOMINAL HYSTERECTOMY    . ANKLE FRACTURE SURGERY  4/08   left---hardware still in place  . 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 BREAST EXCISIONAL BIOPSY Left yrs ago   benign  . BREAST LUMPECTOMY Left 2001   f/u radiation  . CARDIOVERSION N/A 10/04/2017   Procedure: CARDIOVERSION;  Surgeon: Wellington Hampshire, MD;  Location: [facility] ORS;  Service: Cardiovascular;  Laterality: N/A;  . CARDIOVERSION N/A 12/16/2017    Procedure: CARDIOVERSION;  Surgeon: [name], MD;  Location: [facility] ORS;  Service: Cardiovascular;  Laterality: N/A;  . CAROTID ENDARTERECTOMY Left 10/23/2004  . FRACTURE SURGERY    . OOPHORECTOMY    . SHOULDER SURGERY  6/07   left  . TONSILLECTOMY AND ADENOIDECTOMY    . TOTAL HIP ARTHROPLASTY  2004   right    There were no vitals filed for this visit.  Subjective Assessment - 10/30/19 1305    Subjective  Patient states her hip is bothering her more today after walking frequently over the weekend. Patient states her pain is still improved compared to previous sessions.    Pertinent History  Patient previously seen for balance difficulties, shoulder pain and the right, and low back pain.  Patient reports no falls in the past six months. Patient reports pain has not improved for the past month and has been about the same since the onset of pain.    Limitations  Sitting;Standing;Walking    Patient Stated Goals  To improve balance when walking, driving pain    Currently in Pain?  Yes    Pain Score  5     Pain Location  Hip    Pain Orientation  Right    Pain Descriptors /  Indicators  Aching    Pain Type  Acute pain    Pain Onset  More than a month ago    Pain Frequency  Intermittent       TREATMENT  Therapeutic Exercise  LTR with feet positioned on physioball - x 30 Bridges in hooklying - x 10  Standing squats with UE support  -- x 20  Side stepping up and over airex pad with UE support - x 12 B with UE support Hip abduction isometrics with use of belt - x 10 with 5 sec holds SLR in sitting with tactile cueing for muscular activation - 2 x 12 Tandem Amb - 6 x 10 (widened from pure tandem amb) Square stepping for balance (4 squares) - x5 cw/ccw, forward/backward diagonal stepping -x 5 each Side stepping with use of YTB -3 x 69ft B Performed exercises to improve LE and hip strengthening while addressing balance deficits    PT Education - 10/30/19 1306    Education Details   form/technique with exercise    Person(s) Educated  Patient    Methods  Explanation;Demonstration    Comprehension  Returned demonstration;Verbalized understanding       PT Short Term Goals - 10/26/19 1540      PT SHORT TERM GOAL #1   Title  Patient will have a worst pain score of 6/10 to better be able to drive with less overall pain    Baseline  8/10; 10/26/2019: 6/10    Time  3    Period  Weeks    Status  On-going    Target Date  10/09/19        PT Long Term Goals - 10/26/19 1541      PT LONG TERM GOAL #1   Title  Patient will be independent with HEP to continue benefits of therapy after discharge.     Baseline  Dependent with form and technique for exercises; 10/26/2019: cueing needed to complete HEP    Time  6    Period  Weeks    Status  On-going      PT LONG TERM GOAL #2   Title  Patient will improve 79mWT to over 1 m/s to indicate increased safety with ambulating in a community environment.    Baseline  .66 m/s; 10/26/2019: .75 m/s    Time  6    Period  Weeks    Status  On-going      PT LONG TERM GOAL #3   Title  Patient will improve FGA to 63 to indicate significant functional improvement of R hip function.    Baseline  FGA: 44; 10/26/2019: FGA: 48    Time  6    Period  Weeks    Status  On-going      PT LONG TERM GOAL #4   Title  Patient will be able to balance for 10sec with SLS to improve static balance and ability to get dressed when in standing    Baseline  2.5 sec B; 10/26/2019: 2.5 sec    Time  6    Period  Weeks    Status  On-going      PT LONG TERM GOAL #5   Title  Patient will have a worst hip pain of a 3/10 over the past week to indicate functional improvement with transferring and driving    Baseline  worst pain: 8/10; 10/26/2019: 6/10    Time  6    Period  Weeks    Status  On-going  Plan - 10/30/19 1310    Clinical Impression Statement  Patient with increased pain today, this is most likely due increased walking over the weekend.  Performed mobility exercises in the beginning to encourage synovial fluid production. Patient demonstrates increased difficulty with perforing tandem stance ambulation today, this is most likely due to increased fatigued and inreased pain. Continued to address hip weakness and balance during today's session and patient will benefit from further skilled theray to return to prior level of function.    Personal Factors and Comorbidities  Age;Comorbidity 2    Comorbidities  hip fx, RA    Examination-Activity Limitations  Lift;Squat;Stand;Transfers    Examination-Participation Restrictions  Driving    Stability/Clinical Decision Making  Evolving/Moderate complexity    Rehab Potential  Good    PT Frequency  2x / week    PT Duration  6 weeks    PT Treatment/Interventions  Neuromuscular re-education;Stair training;Moist Heat;Cryotherapy;Iontophoresis 4mg /ml Dexamethasone;Electrical Stimulation;DME Instruction;Therapeutic activities;Therapeutic exercise;Balance training;Manual techniques;Patient/family education;Passive range of motion;Dry needling;Joint Manipulations    PT Next Visit Plan  progress hip strengthening, use of SPC    PT Home Exercise Plan  See education section    Consulted and Agree with Plan of Care  Patient       Patient will benefit from skilled therapeutic intervention in order to improve the following deficits and impairments:  Abnormal gait, Decreased activity tolerance, Decreased endurance, Decreased range of motion, Decreased strength, Decreased balance, Difficulty walking, Decreased cognition, Pain, Decreased coordination, Increased muscle spasms, Postural dysfunction, Hypomobility  Visit Diagnosis: Unsteadiness on feet  History of falling  Pain in right hip     Problem List Patient Active Problem List   Diagnosis Date Noted  . Closed right hip fracture ([place]) 09/20/2019  . Gout 05/18/2019  . Clavi 03/09/2019  . Right hip pain 07/08/2018  . Chronic bilateral low back  pain without sciatica 12/31/2017  . Atrial fibrillation ([place]) 09/10/2017  . Trapezius strain, right, initial encounter 09/10/2017  . Depression, major, single episode, mild ([place]) 09/10/2017  . Chronic venous insufficiency 09/09/2017  . Lymphedema 09/09/2017  . Pain of toe of left foot 08/17/2017  . Leg swelling 07/31/2017  . Dry eyes 07/06/2017  . Pain and swelling of lower extremity, right 03/29/2017  . Right shoulder pain 03/29/2017  . Diarrhea 05/18/2016  . Fall 05/01/2016  . Leg weakness, bilateral 12/30/2015  . Macrocytic anemia 05/19/2013  . Psoriatic arthritis ([place]) 02/03/2013  . Right hand pain 08/05/2012  . Psoriasis   . Osteoarthritis, multiple sites   . History of breast cancer 10/28/2011  . GERD (gastroesophageal reflux disease)   . Hypertension   . Osteopenia   . Carotid stenosis 05/27/2011    Blythe Stanford, PT DPT 10/30/2019, 1:45 PM  [facility] [address], [place], [address] Phone: [phone]   Fax:  [phone]  Name: [name] MRN: [id number] Date of Birth: 08-22-1940

## 2019-11-02 ENCOUNTER — Other Ambulatory Visit: Payer: Self-pay

## 2019-11-02 ENCOUNTER — Ambulatory Visit: Payer: Medicare Other

## 2019-11-02 DIAGNOSIS — M25511 Pain in right shoulder: Secondary | ICD-10-CM | POA: Diagnosis not present

## 2019-11-02 DIAGNOSIS — M25551 Pain in right hip: Secondary | ICD-10-CM | POA: Diagnosis not present

## 2019-11-02 DIAGNOSIS — Z9181 History of falling: Secondary | ICD-10-CM | POA: Diagnosis not present

## 2019-11-02 DIAGNOSIS — R2681 Unsteadiness on feet: Secondary | ICD-10-CM

## 2019-11-02 DIAGNOSIS — M6281 Muscle weakness (generalized): Secondary | ICD-10-CM | POA: Diagnosis not present

## 2019-11-02 DIAGNOSIS — M545 Low back pain: Secondary | ICD-10-CM | POA: Diagnosis not present

## 2019-11-02 NOTE — Therapy (Signed)
[facility] [address] [place], [address] Phone: [phone]   Fax:  [phone]  Physical Therapy Treatment  Patient Details  Name: [name] MRN: [id number] Date of Birth: 1941-01-18 Referring Provider (PT): Tamala Julian   Encounter Date: 11/02/2019  PT End of Session - 11/02/19 0826    Visit Number  12    Number of Visits  13    Date for PT Re-Evaluation  12/14/19    Authorization Type  2/10 Medicare    PT Start Time  0815    PT Stop Time  0900    PT Time Calculation (min)  45 min    Activity Tolerance  Patient tolerated treatment well    Behavior During Therapy  [facility] for tasks assessed/performed       Past Medical History:  Diagnosis Date  . (HFpEF) heart failure with preserved ejection fraction ([place])    a. 08/2017 Echo: EF 55-60%, no rwma, mild MR, mildly dil LA, nl RV fxn.  . Breast cancer ([place]) 2001   left breast  . Cancer ([place]) 2001   left breast ca  . Carotid arterial disease ([place])    a. 10/2004 s/p L CEA; b. 12/2015 Carotid U/S: RICA 1-39%; b. LICA patent CEA site.  Marland Kitchen GERD (gastroesophageal reflux disease)   . Hyperlipidemia   . Hypertension   . Osteoarthritis, multiple sites   . Osteopenia   . Persistent atrial fibrillation ([place])    a. Dx 08/2017; b. CHA2DS2VASc = 6-->Xarelto; c. 09/2017 Successful DCCV (second shock - 200J);   Marland Kitchen Personal history of radiation therapy 2001   left breast ca  . Psoriasis     Past Surgical History:  Procedure Laterality Date  . ABDOMINAL HYSTERECTOMY    . ANKLE FRACTURE SURGERY  4/08   left---hardware still in place  . 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 BREAST EXCISIONAL BIOPSY Left yrs ago   benign  . BREAST LUMPECTOMY Left 2001   f/u radiation  . CARDIOVERSION N/A 10/04/2017   Procedure: CARDIOVERSION;  Surgeon: Wellington Hampshire, MD;  Location: [facility] ORS;  Service: Cardiovascular;  Laterality: N/A;  . CARDIOVERSION N/A 12/16/2017    Procedure: CARDIOVERSION;  Surgeon: [name], MD;  Location: [facility] ORS;  Service: Cardiovascular;  Laterality: N/A;  . CAROTID ENDARTERECTOMY Left 10/23/2004  . FRACTURE SURGERY    . OOPHORECTOMY    . SHOULDER SURGERY  6/07   left  . TONSILLECTOMY AND ADENOIDECTOMY    . TOTAL HIP ARTHROPLASTY  2004   right    There were no vitals filed for this visit.  Subjective Assessment - 11/02/19 0819    Subjective  Patient states her hip has been feeling better overall but states to have increased pain in the morning.    Pertinent History  Patient previously seen for balance difficulties, shoulder pain and the right, and low back pain.  Patient reports no falls in the past six months. Patient reports pain has not improved for the past month and has been about the same since the onset of pain.    Limitations  Sitting;Standing;Walking    Patient Stated Goals  To improve balance when walking, driving pain    Currently in Pain?  Yes    Pain Score  7     Pain Location  Hip    Pain Orientation  Right    Pain Descriptors / Indicators  Aching    Pain  Type  Acute pain    Pain Onset  More than a month ago    Pain Frequency  Intermittent           TREATMENT  Therapeutic Exercise  LTR with feet positioned on physioball - x 30 Bridges in hooklying - x 10  Pre-Gait in standing with weight shifts forward/backward  Knees to chest with the ball - x 20  Hip abduction in standing with B UE support Ambulation with focus on improving narrow base of support with unilateral UE support - 57ft x 4 Side stepping over hurdles -3 hurdles - x 5 down and back Side stepping up and over 4" step - x 20  Forward stepping onto 4" step - x 20  Performed exercises to improve LE and hip strengthening while addressing balance deficits      PT Education - 11/02/19 0825    Education Details  form/technique with exercise    Person(s) Educated  Patient    Methods  Explanation;Demonstration    Comprehension   Verbalized understanding;Returned demonstration       PT Short Term Goals - 10/26/19 1540      PT SHORT TERM GOAL #1   Title  Patient will have a worst pain score of 6/10 to better be able to drive with less overall pain    Baseline  8/10; 10/26/2019: 6/10    Time  3    Period  Weeks    Status  On-going    Target Date  10/09/19        PT Long Term Goals - 11/02/19 0826      PT LONG TERM GOAL #1   Title  Patient will be independent with HEP to continue benefits of therapy after discharge.     Baseline  Dependent with form and technique for exercises; 10/26/2019: cueing needed to complete HEP    Time  6    Period  Weeks    Status  On-going      PT LONG TERM GOAL #2   Title  Patient will improve 4mWT to over 1 m/s to indicate increased safety with ambulating in a community environment.    Baseline  .66 m/s; 10/26/2019: .75 m/s    Time  6    Period  Weeks    Status  On-going      PT LONG TERM GOAL #3   Title  Patient will improve FGA to 63 to indicate significant functional improvement of R hip function.    Baseline  FGA: 44; 10/26/2019: FGA: 48    Time  6    Period  Weeks    Status  On-going      PT LONG TERM GOAL #4   Title  Patient will be able to balance for 10sec with SLS to improve static balance and ability to get dressed when in standing    Baseline  2.5 sec B; 10/26/2019: 2.5 sec    Time  6    Period  Weeks    Status  On-going      PT LONG TERM GOAL #5   Title  Patient will have a worst hip pain of a 3/10 over the past week to indicate functional improvement with transferring and driving    Baseline  worst pain: 8/10; 10/26/2019: 6/10    Time  6    Period  Weeks    Status  On-going            Plan - 11/02/19 [id number]  Clinical Impression Statement  Increased pain and guarding along the affected hip upon coming into the session. Pain improved after performing mobility based exercises then continued to focus on improving exerises that address gluteal activation  and dynamic balance. Patient able to perform step ups without pain but only able to perform from a shorter step (4"). Patient will benefit form furhter skilled therapy to focus on improving dynamic balance and strength to improve walking ability and decrease pain.    Personal Factors and Comorbidities  Age;Comorbidity 2    Comorbidities  hip fx, RA    Examination-Activity Limitations  Lift;Squat;Stand;Transfers    Examination-Participation Restrictions  Driving    Stability/Clinical Decision Making  Evolving/Moderate complexity    Rehab Potential  Good    PT Frequency  2x / week    PT Duration  6 weeks    PT Treatment/Interventions  Neuromuscular re-education;Stair training;Moist Heat;Cryotherapy;Iontophoresis 4mg /ml Dexamethasone;Electrical Stimulation;DME Instruction;Therapeutic activities;Therapeutic exercise;Balance training;Manual techniques;Patient/family education;Passive range of motion;Dry needling;Joint Manipulations    PT Next Visit Plan  progress hip strengthening, use of SPC    PT Home Exercise Plan  See education section    Consulted and Agree with Plan of Care  Patient       Patient will benefit from skilled therapeutic intervention in order to improve the following deficits and impairments:  Abnormal gait, Decreased activity tolerance, Decreased endurance, Decreased range of motion, Decreased strength, Decreased balance, Difficulty walking, Decreased cognition, Pain, Decreased coordination, Increased muscle spasms, Postural dysfunction, Hypomobility  Visit Diagnosis: Unsteadiness on feet  History of falling  Pain in right hip     Problem List Patient Active Problem List   Diagnosis Date Noted  . Closed right hip fracture ([place]) 09/20/2019  . Gout 05/18/2019  . Clavi 03/09/2019  . Right hip pain 07/08/2018  . Chronic bilateral low back pain without sciatica 12/31/2017  . Atrial fibrillation ([place]) 09/10/2017  . Trapezius strain, right, initial encounter 09/10/2017   . Depression, major, single episode, mild ([place]) 09/10/2017  . Chronic venous insufficiency 09/09/2017  . Lymphedema 09/09/2017  . Pain of toe of left foot 08/17/2017  . Leg swelling 07/31/2017  . Dry eyes 07/06/2017  . Pain and swelling of lower extremity, right 03/29/2017  . Right shoulder pain 03/29/2017  . Diarrhea 05/18/2016  . Fall 05/01/2016  . Leg weakness, bilateral 12/30/2015  . Macrocytic anemia 05/19/2013  . Psoriatic arthritis ([place]) 02/03/2013  . Right hand pain 08/05/2012  . Psoriasis   . Osteoarthritis, multiple sites   . History of breast cancer 10/28/2011  . GERD (gastroesophageal reflux disease)   . Hypertension   . Osteopenia   . Carotid stenosis 05/27/2011    Blythe Stanford, PT DPT 11/02/2019, 8:58 AM  [facility] [address], [place], [address] Phone: [phone]   Fax:  [phone]  Name: ANSA DERICKSON MRN: [id number] Date of Birth: 1941/07/07

## 2019-11-06 ENCOUNTER — Other Ambulatory Visit: Payer: Self-pay

## 2019-11-06 ENCOUNTER — Ambulatory Visit: Payer: Medicare Other

## 2019-11-06 DIAGNOSIS — M25551 Pain in right hip: Secondary | ICD-10-CM | POA: Diagnosis not present

## 2019-11-06 DIAGNOSIS — R2681 Unsteadiness on feet: Secondary | ICD-10-CM | POA: Diagnosis not present

## 2019-11-06 DIAGNOSIS — M545 Low back pain: Secondary | ICD-10-CM | POA: Diagnosis not present

## 2019-11-06 DIAGNOSIS — M25511 Pain in right shoulder: Secondary | ICD-10-CM | POA: Diagnosis not present

## 2019-11-06 DIAGNOSIS — Z9181 History of falling: Secondary | ICD-10-CM

## 2019-11-06 DIAGNOSIS — M6281 Muscle weakness (generalized): Secondary | ICD-10-CM | POA: Diagnosis not present

## 2019-11-06 NOTE — Therapy (Signed)
[facility] [address], [place], [address] Phone: [phone]   Fax:  [phone]  Physical Therapy Treatment  Patient Details  Name: [name] MRN: [id number] Date of Birth: 03/30/1941 Referring Provider (PT): Tamala Julian   Encounter Date: 11/06/2019  PT End of Session - 11/06/19 1312    Visit Number  13    Number of Visits  21    Date for PT Re-Evaluation  12/14/19    Authorization Type  2/10 Medicare    PT Start Time  1300    PT Stop Time  1345    PT Time Calculation (min)  45 min    Activity Tolerance  Patient tolerated treatment well    Behavior During Therapy  [facility] for tasks assessed/performed       Past Medical History:  Diagnosis Date  . (HFpEF) heart failure with preserved ejection fraction ([place])    a. 08/2017 Echo: EF 55-60%, no rwma, mild MR, mildly dil LA, nl RV fxn.  . Breast cancer ([place]) 2001   left breast  . Cancer ([place]) 2001   left breast ca  . Carotid arterial disease ([place])    a. 10/2004 s/p L CEA; b. 12/2015 Carotid U/S: RICA 1-39%; b. LICA patent CEA site.  Marland Kitchen GERD (gastroesophageal reflux disease)   . Hyperlipidemia   . Hypertension   . Osteoarthritis, multiple sites   . Osteopenia   . Persistent atrial fibrillation ([place])    a. Dx 08/2017; b. CHA2DS2VASc = 6-->Xarelto; c. 09/2017 Successful DCCV (second shock - 200J);   Marland Kitchen Personal history of radiation therapy 2001   left breast ca  . Psoriasis     Past Surgical History:  Procedure Laterality Date  . ABDOMINAL HYSTERECTOMY    . ANKLE FRACTURE SURGERY  4/08   left---hardware still in place  . 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 BREAST EXCISIONAL BIOPSY Left yrs ago   benign  . BREAST LUMPECTOMY Left 2001   f/u radiation  . CARDIOVERSION N/A 10/04/2017   Procedure: CARDIOVERSION;  Surgeon: Wellington Hampshire, MD;  Location: [facility] ORS;  Service: Cardiovascular;  Laterality: N/A;  . CARDIOVERSION N/A 12/16/2017   Procedure: CARDIOVERSION;  Surgeon: [name], MD;  Location: [facility] ORS;  Service: Cardiovascular;  Laterality: N/A;  . CAROTID ENDARTERECTOMY Left 10/23/2004  . FRACTURE SURGERY    . OOPHORECTOMY    . SHOULDER SURGERY  6/07   left  . TONSILLECTOMY AND ADENOIDECTOMY    . TOTAL HIP ARTHROPLASTY  2004   right    There were no vitals filed for this visit.  Subjective Assessment - 11/06/19 1306    Subjective  Patient reports her hip was feeling good this morning but sat down and for a prolonged period of time which caused an increase in pain.    Pertinent History  Patient previously seen for balance difficulties, shoulder pain and the right, and low back pain.  Patient reports no falls in the past six months. Patient reports pain has not improved for the past month and has been about the same since the onset of pain.    Limitations  Sitting;Standing;Walking    Patient Stated Goals  To improve balance when walking, driving pain    Currently in Pain?  Yes    Pain Score  5     Pain Location  Hip    Pain Orientation  Right    Pain Descriptors / Indicators  Aching    Pain Type  Acute pain    Pain Onset  More than a month ago    Pain Frequency  Intermittent         TREATMENT Therapeutic Exercise Hip flexor stretch in standing - x 20 with 5 sec  Hip abduction in standing with B UE support - x 10  Side stepping up and over 6" step, -- x 10 stopped secondary to pain; 4" step - x 20  Forward stepping onto 4" step - x 20  Feet together balance in standing on airex pad - 3 x 1 min; with ball toss - x 15 Sit to stands with use of TRX straps - 2 x 10   Performed exercises to improve LE and hip strengthening while addressing balance deficits     PT Education - 11/06/19 1308    Education Details  form/technique with exercise    Person(s) Educated  Patient    Methods  Explanation;Demonstration    Comprehension  Verbalized understanding;Returned demonstration       PT Short Term Goals  - 10/26/19 1540      PT SHORT TERM GOAL #1   Title  Patient will have a worst pain score of 6/10 to better be able to drive with less overall pain    Baseline  8/10; 10/26/2019: 6/10    Time  3    Period  Weeks    Status  On-going    Target Date  10/09/19        PT Long Term Goals - 11/02/19 0826      PT LONG TERM GOAL #1   Title  Patient will be independent with HEP to continue benefits of therapy after discharge.     Baseline  Dependent with form and technique for exercises; 10/26/2019: cueing needed to complete HEP    Time  6    Period  Weeks    Status  On-going      PT LONG TERM GOAL #2   Title  Patient will improve 63mWT to over 1 m/s to indicate increased safety with ambulating in a community environment.    Baseline  .66 m/s; 10/26/2019: .75 m/s    Time  6    Period  Weeks    Status  On-going      PT LONG TERM GOAL #3   Title  Patient will improve FGA to 63 to indicate significant functional improvement of R hip function.    Baseline  FGA: 44; 10/26/2019: FGA: 48    Time  6    Period  Weeks    Status  On-going      PT LONG TERM GOAL #4   Title  Patient will be able to balance for 10sec with SLS to improve static balance and ability to get dressed when in standing    Baseline  2.5 sec B; 10/26/2019: 2.5 sec    Time  6    Period  Weeks    Status  On-going      PT LONG TERM GOAL #5   Title  Patient will have a worst hip pain of a 3/10 over the past week to indicate functional improvement with transferring and driving    Baseline  worst pain: 8/10; 10/26/2019: 6/10    Time  6    Period  Weeks    Status  On-going            Plan - 11/06/19 1316    Clinical Impression Statement  Continued to focus on improving hip strengthening and motor control with performing hip flexion on the R side (increased hip IR with movement). Patient demonstrates improvement  with 4" step versus previous session. Attempted 6" step, unable to perform secondary to increased pain. Patient will  benefit from from further skilled therapy to return to prior level of function.    Personal Factors and Comorbidities  Age;Comorbidity 2    Comorbidities  hip fx, RA    Examination-Activity Limitations  Lift;Squat;Stand;Transfers    Examination-Participation Restrictions  Driving    Stability/Clinical Decision Making  Evolving/Moderate complexity    Rehab Potential  Good    PT Frequency  2x / week    PT Duration  6 weeks    PT Treatment/Interventions  Neuromuscular re-education;Stair training;Moist Heat;Cryotherapy;Iontophoresis 4mg /ml Dexamethasone;Electrical Stimulation;DME Instruction;Therapeutic activities;Therapeutic exercise;Balance training;Manual techniques;Patient/family education;Passive range of motion;Dry needling;Joint Manipulations    PT Next Visit Plan  progress hip strengthening, use of SPC    PT Home Exercise Plan  See education section    Consulted and Agree with Plan of Care  Patient       Patient will benefit from skilled therapeutic intervention in order to improve the following deficits and impairments:  Abnormal gait, Decreased activity tolerance, Decreased endurance, Decreased range of motion, Decreased strength, Decreased balance, Difficulty walking, Decreased cognition, Pain, Decreased coordination, Increased muscle spasms, Postural dysfunction, Hypomobility  Visit Diagnosis: Unsteadiness on feet  History of falling  Pain in right hip     Problem List Patient Active Problem List   Diagnosis Date Noted  . Closed right hip fracture ([place]) 09/20/2019  . Gout 05/18/2019  . Clavi 03/09/2019  . Right hip pain 07/08/2018  . Chronic bilateral low back pain without sciatica 12/31/2017  . Atrial fibrillation ([place]) 09/10/2017  . Trapezius strain, right, initial encounter 09/10/2017  . Depression, major, single episode, mild ([place]) 09/10/2017  . Chronic venous insufficiency 09/09/2017  . Lymphedema 09/09/2017  . Pain of toe of left foot 08/17/2017  . Leg swelling  07/31/2017  . Dry eyes 07/06/2017  . Pain and swelling of lower extremity, right 03/29/2017  . Right shoulder pain 03/29/2017  . Diarrhea 05/18/2016  . Fall 05/01/2016  . Leg weakness, bilateral 12/30/2015  . Macrocytic anemia 05/19/2013  . Psoriatic arthritis ([place]) 02/03/2013  . Right hand pain 08/05/2012  . Psoriasis   . Osteoarthritis, multiple sites   . History of breast cancer 10/28/2011  . GERD (gastroesophageal reflux disease)   . Hypertension   . Osteopenia   . Carotid stenosis 05/27/2011    Blythe Stanford, PT DPT 11/06/2019, 1:36 PM  [facility] [address] [place], [address] Phone: [phone]   Fax:  [phone]  Name: [name] MRN: [id number] Date of Birth: 03-Apr-1941

## 2019-11-09 ENCOUNTER — Other Ambulatory Visit: Payer: Self-pay

## 2019-11-09 ENCOUNTER — Ambulatory Visit: Payer: Medicare Other

## 2019-11-09 DIAGNOSIS — M25511 Pain in right shoulder: Secondary | ICD-10-CM | POA: Diagnosis not present

## 2019-11-09 DIAGNOSIS — R2681 Unsteadiness on feet: Secondary | ICD-10-CM | POA: Diagnosis not present

## 2019-11-09 DIAGNOSIS — M25551 Pain in right hip: Secondary | ICD-10-CM

## 2019-11-09 DIAGNOSIS — Z9181 History of falling: Secondary | ICD-10-CM

## 2019-11-09 DIAGNOSIS — M6281 Muscle weakness (generalized): Secondary | ICD-10-CM | POA: Diagnosis not present

## 2019-11-09 DIAGNOSIS — M545 Low back pain: Secondary | ICD-10-CM | POA: Diagnosis not present

## 2019-11-09 NOTE — Therapy (Signed)
[facility] [address] [place], [address] Phone: [phone]   Fax:  [phone]  Physical Therapy Treatment  Patient Details  Name: [name] MRN: [id number] Date of Birth: 05-05-1941 Referring Provider (PT): Tamala Julian   Encounter Date: 11/09/2019  PT End of Session - 11/09/19 1127    Visit Number  14    Number of Visits  21    Date for PT Re-Evaluation  12/14/19    Authorization Type  4/10 Medicare    PT Start Time  1115    PT Stop Time  1200    PT Time Calculation (min)  45 min    Activity Tolerance  Patient tolerated treatment well    Behavior During Therapy  [facility] for tasks assessed/performed       Past Medical History:  Diagnosis Date  . (HFpEF) heart failure with preserved ejection fraction ([place])    a. 08/2017 Echo: EF 55-60%, no rwma, mild MR, mildly dil LA, nl RV fxn.  . Breast cancer ([place]) 2001   left breast  . Cancer ([place]) 2001   left breast ca  . Carotid arterial disease ([place])    a. 10/2004 s/p L CEA; b. 12/2015 Carotid U/S: RICA 1-39%; b. LICA patent CEA site.  Marland Kitchen GERD (gastroesophageal reflux disease)   . Hyperlipidemia   . Hypertension   . Osteoarthritis, multiple sites   . Osteopenia   . Persistent atrial fibrillation ([place])    a. Dx 08/2017; b. CHA2DS2VASc = 6-->Xarelto; c. 09/2017 Successful DCCV (second shock - 200J);   Marland Kitchen Personal history of radiation therapy 2001   left breast ca  . Psoriasis     Past Surgical History:  Procedure Laterality Date  . ABDOMINAL HYSTERECTOMY    . ANKLE FRACTURE SURGERY  4/08   left---hardware still in place  . 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 BREAST EXCISIONAL BIOPSY Left yrs ago   benign  . BREAST LUMPECTOMY Left 2001   f/u radiation  . CARDIOVERSION N/A 10/04/2017   Procedure: CARDIOVERSION;  Surgeon: Wellington Hampshire, MD;  Location: [facility] ORS;  Service: Cardiovascular;  Laterality: N/A;  . CARDIOVERSION N/A 12/16/2017    Procedure: CARDIOVERSION;  Surgeon: [name], MD;  Location: [facility] ORS;  Service: Cardiovascular;  Laterality: N/A;  . CAROTID ENDARTERECTOMY Left 10/23/2004  . FRACTURE SURGERY    . OOPHORECTOMY    . SHOULDER SURGERY  6/07   left  . TONSILLECTOMY AND ADENOIDECTOMY    . TOTAL HIP ARTHROPLASTY  2004   right    There were no vitals filed for this visit.  Subjective Assessment - 11/09/19 1119    Subjective  Patient states she feels she is improving overall with greater amount of walking. Patient states no major compliants currently.    Pertinent History  Patient previously seen for balance difficulties, shoulder pain and the right, and low back pain.  Patient reports no falls in the past six months. Patient reports pain has not improved for the past month and has been about the same since the onset of pain.    Limitations  Sitting;Standing;Walking    Patient Stated Goals  To improve balance when walking, driving pain    Currently in Pain?  Yes    Pain Score  3     Pain Location  Hip    Pain Orientation  Right    Pain Descriptors / Indicators  Aching  Pain Type  Acute pain    Pain Onset  More than a month ago    Pain Frequency  Intermittent            TREATMENT Therapeutic Exercise Semi-circles in standing with furniture slider - 2 x 10  Sit to stands with use of TRX straps - 3 x 10  Hip extension in standing with YTB - 2 x 10  Hip abduction in standing with YTB - 2 x 10  Forward leaning and pre gait to address hip limitations - 2 x 10  Side stepping up and over 6" step - x 20  Forward stepping onto 4" step - x 12 Single leg stance with 2 finger support - 2 x 45sec   Performed exercises to improve LE and hip strengthening while addressing balance deficits     PT Education - 11/09/19 1126    Education Details  form/technique with exercise    Person(s) Educated  Patient    Methods  Explanation;Demonstration    Comprehension  Verbalized understanding;Returned  demonstration       PT Short Term Goals - 10/26/19 1540      PT SHORT TERM GOAL #1   Title  Patient will have a worst pain score of 6/10 to better be able to drive with less overall pain    Baseline  8/10; 10/26/2019: 6/10    Time  3    Period  Weeks    Status  On-going    Target Date  10/09/19        PT Long Term Goals - 11/02/19 0826      PT LONG TERM GOAL #1   Title  Patient will be independent with HEP to continue benefits of therapy after discharge.     Baseline  Dependent with form and technique for exercises; 10/26/2019: cueing needed to complete HEP    Time  6    Period  Weeks    Status  On-going      PT LONG TERM GOAL #2   Title  Patient will improve 51mWT to over 1 m/s to indicate increased safety with ambulating in a community environment.    Baseline  .66 m/s; 10/26/2019: .75 m/s    Time  6    Period  Weeks    Status  On-going      PT LONG TERM GOAL #3   Title  Patient will improve FGA to 63 to indicate significant functional improvement of R hip function.    Baseline  FGA: 44; 10/26/2019: FGA: 48    Time  6    Period  Weeks    Status  On-going      PT LONG TERM GOAL #4   Title  Patient will be able to balance for 10sec with SLS to improve static balance and ability to get dressed when in standing    Baseline  2.5 sec B; 10/26/2019: 2.5 sec    Time  6    Period  Weeks    Status  On-going      PT LONG TERM GOAL #5   Title  Patient will have a worst hip pain of a 3/10 over the past week to indicate functional improvement with transferring and driving    Baseline  worst pain: 8/10; 10/26/2019: 6/10    Time  6    Period  Weeks    Status  On-going            Plan - 11/09/19 1131  Clinical Impression Statement  Progressed exercises today with adding resistance bands to hip motions performed and increased the height of step and patient able to tolerate side stepping from a 6" step compared to being able to perform from a 4" step the previous session. Patient  continues to have difficulty with narrow BOS balancing. Patient will benefit form further skilled therapy to return to prior level of function.    Personal Factors and Comorbidities  Age;Comorbidity 2    Comorbidities  hip fx, RA    Examination-Activity Limitations  Lift;Squat;Stand;Transfers    Examination-Participation Restrictions  Driving    Stability/Clinical Decision Making  Evolving/Moderate complexity    Rehab Potential  Good    PT Frequency  2x / week    PT Duration  6 weeks    PT Treatment/Interventions  Neuromuscular re-education;Stair training;Moist Heat;Cryotherapy;Iontophoresis 4mg /ml Dexamethasone;Electrical Stimulation;DME Instruction;Therapeutic activities;Therapeutic exercise;Balance training;Manual techniques;Patient/family education;Passive range of motion;Dry needling;Joint Manipulations    PT Next Visit Plan  progress hip strengthening, use of SPC    PT Home Exercise Plan  See education section    Consulted and Agree with Plan of Care  Patient       Patient will benefit from skilled therapeutic intervention in order to improve the following deficits and impairments:  Abnormal gait, Decreased activity tolerance, Decreased endurance, Decreased range of motion, Decreased strength, Decreased balance, Difficulty walking, Decreased cognition, Pain, Decreased coordination, Increased muscle spasms, Postural dysfunction, Hypomobility  Visit Diagnosis: Unsteadiness on feet  History of falling  Pain in right hip     Problem List Patient Active Problem List   Diagnosis Date Noted  . Closed right hip fracture ([place]) 09/20/2019  . Gout 05/18/2019  . Clavi 03/09/2019  . Right hip pain 07/08/2018  . Chronic bilateral low back pain without sciatica 12/31/2017  . Atrial fibrillation ([place]) 09/10/2017  . Trapezius strain, right, initial encounter 09/10/2017  . Depression, major, single episode, mild ([place]) 09/10/2017  . Chronic venous insufficiency 09/09/2017  . Lymphedema  09/09/2017  . Pain of toe of left foot 08/17/2017  . Leg swelling 07/31/2017  . Dry eyes 07/06/2017  . Pain and swelling of lower extremity, right 03/29/2017  . Right shoulder pain 03/29/2017  . Diarrhea 05/18/2016  . Fall 05/01/2016  . Leg weakness, bilateral 12/30/2015  . Macrocytic anemia 05/19/2013  . Psoriatic arthritis ([place]) 02/03/2013  . Right hand pain 08/05/2012  . Psoriasis   . Osteoarthritis, multiple sites   . History of breast cancer 10/28/2011  . GERD (gastroesophageal reflux disease)   . Hypertension   . Osteopenia   . Carotid stenosis 05/27/2011    Blythe Stanford, PT DPT 11/09/2019, 12:05 PM  [facility] [address] [place], [address] Phone: [phone]   Fax:  [phone]  Name: [name] MRN: [id number] Date of Birth: 08-Jan-1941

## 2019-11-13 ENCOUNTER — Other Ambulatory Visit: Payer: Self-pay

## 2019-11-13 ENCOUNTER — Ambulatory Visit: Payer: Medicare Other

## 2019-11-13 DIAGNOSIS — R2681 Unsteadiness on feet: Secondary | ICD-10-CM

## 2019-11-13 DIAGNOSIS — Z9181 History of falling: Secondary | ICD-10-CM | POA: Diagnosis not present

## 2019-11-13 DIAGNOSIS — M25551 Pain in right hip: Secondary | ICD-10-CM

## 2019-11-13 DIAGNOSIS — M25511 Pain in right shoulder: Secondary | ICD-10-CM | POA: Diagnosis not present

## 2019-11-13 DIAGNOSIS — M6281 Muscle weakness (generalized): Secondary | ICD-10-CM | POA: Diagnosis not present

## 2019-11-13 DIAGNOSIS — M545 Low back pain: Secondary | ICD-10-CM | POA: Diagnosis not present

## 2019-11-13 NOTE — Therapy (Signed)
[facility] [address] [place], [address] Phone: [phone]   Fax:  [phone]  Physical Therapy Treatment  Patient Details  Name: [name] MRN: [id number] Date of Birth: 08-08-1940 Referring Provider (PT): Tamala Julian   Encounter Date: 11/13/2019  PT End of Session - 11/13/19 1128    Visit Number  15    Number of Visits  21    Date for PT Re-Evaluation  12/14/19    Authorization Type  5/10 Medicare    PT Start Time  1115    PT Stop Time  1200    PT Time Calculation (min)  45 min    Activity Tolerance  Patient tolerated treatment well    Behavior During Therapy  [facility] for tasks assessed/performed       Past Medical History:  Diagnosis Date  . (HFpEF) heart failure with preserved ejection fraction ([place])    a. 08/2017 Echo: EF 55-60%, no rwma, mild MR, mildly dil LA, nl RV fxn.  . Breast cancer ([place]) 2001   left breast  . Cancer ([place]) 2001   left breast ca  . Carotid arterial disease ([place])    a. 10/2004 s/p L CEA; b. 12/2015 Carotid U/S: RICA 1-39%; b. LICA patent CEA site.  Marland Kitchen GERD (gastroesophageal reflux disease)   . Hyperlipidemia   . Hypertension   . Osteoarthritis, multiple sites   . Osteopenia   . Persistent atrial fibrillation ([place])    a. Dx 08/2017; b. CHA2DS2VASc = 6-->Xarelto; c. 09/2017 Successful DCCV (second shock - 200J);   Marland Kitchen Personal history of radiation therapy 2001   left breast ca  . Psoriasis     Past Surgical History:  Procedure Laterality Date  . ABDOMINAL HYSTERECTOMY    . ANKLE FRACTURE SURGERY  4/08   left---hardware still in place  . 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 BREAST EXCISIONAL BIOPSY Left yrs ago   benign  . BREAST LUMPECTOMY Left 2001   f/u radiation  . CARDIOVERSION N/A 10/04/2017   Procedure: CARDIOVERSION;  Surgeon: Wellington Hampshire, MD;  Location: [facility] ORS;  Service: Cardiovascular;  Laterality: N/A;  . CARDIOVERSION N/A 12/16/2017   Procedure: CARDIOVERSION;  Surgeon: [name], MD;  Location: [facility] ORS;  Service: Cardiovascular;  Laterality: N/A;  . CAROTID ENDARTERECTOMY Left 10/23/2004  . FRACTURE SURGERY    . OOPHORECTOMY    . SHOULDER SURGERY  6/07   left  . TONSILLECTOMY AND ADENOIDECTOMY    . TOTAL HIP ARTHROPLASTY  2004   right    There were no vitals filed for this visit.  Subjective Assessment - 11/13/19 1122    Subjective  Patient reports her hip and back continue to improve states walking and standing have continued to improve overall.    Pertinent History  Patient previously seen for balance difficulties, shoulder pain and the right, and low back pain.  Patient reports no falls in the past six months. Patient reports pain has not improved for the past month and has been about the same since the onset of pain.    Limitations  Sitting;Standing;Walking    Patient Stated Goals  To improve balance when walking, driving pain    Currently in Pain?  Yes    Pain Score  3     Pain Location  Hip    Pain Orientation  Right    Pain Descriptors / Indicators  Aching    Pain Type  Acute pain    Pain Onset  More than a month ago    Pain Frequency  Intermittent         TREATMENT Therapeutic Exercise Sit to stands with use of TRX straps - 3 x 10  Side stepping across airex beam - x 27ft Semi-circles in standing with furniture slider - 2 x 10  Hip swings laterally/forward, backward in standing - 2 x 10  Side stepping up and over 6" step - x 20  Side stepping across airex beam - 4 x 9ft Sit to stands with UE support with Airex on seat - 2 x 5  Side stepping with YTB along knees - 68ft x 3 B Forward stepping onto 6" step - x 12    Performed exercises to improve LE and hip strengthening while addressing balance difficulties    PT Education - 11/13/19 1127    Education Details  form/technique with exercise    Person(s) Educated  Patient    Methods  Explanation;Demonstration    Comprehension  Verbalized  understanding;Returned demonstration       PT Short Term Goals - 10/26/19 1540      PT SHORT TERM GOAL #1   Title  Patient will have a worst pain score of 6/10 to better be able to drive with less overall pain    Baseline  8/10; 10/26/2019: 6/10    Time  3    Period  Weeks    Status  On-going    Target Date  10/09/19        PT Long Term Goals - 11/02/19 0826      PT LONG TERM GOAL #1   Title  Patient will be independent with HEP to continue benefits of therapy after discharge.     Baseline  Dependent with form and technique for exercises; 10/26/2019: cueing needed to complete HEP    Time  6    Period  Weeks    Status  On-going      PT LONG TERM GOAL #2   Title  Patient will improve 60mWT to over 1 m/s to indicate increased safety with ambulating in a community environment.    Baseline  .66 m/s; 10/26/2019: .75 m/s    Time  6    Period  Weeks    Status  On-going      PT LONG TERM GOAL #3   Title  Patient will improve FGA to 63 to indicate significant functional improvement of R hip function.    Baseline  FGA: 44; 10/26/2019: FGA: 48    Time  6    Period  Weeks    Status  On-going      PT LONG TERM GOAL #4   Title  Patient will be able to balance for 10sec with SLS to improve static balance and ability to get dressed when in standing    Baseline  2.5 sec B; 10/26/2019: 2.5 sec    Time  6    Period  Weeks    Status  On-going      PT LONG TERM GOAL #5   Title  Patient will have a worst hip pain of a 3/10 over the past week to indicate functional improvement with transferring and driving    Baseline  worst pain: 8/10; 10/26/2019: 6/10    Time  6    Period  Weeks    Status  On-going            Plan - 11/13/19 1140  Clinical Impression Statement  Continued to focus on improving hip strength most notably with sit to standing and moving while in standing. Patient demonstrates difficulty with performing sit to stands from low seating positions and address this by  addressing power limitations throuhgout today's session. Patient will benefit from further skilled therapy focused on improving these limtiations to return to prior level of function.    Personal Factors and Comorbidities  Age;Comorbidity 2    Comorbidities  hip fx, RA    Examination-Activity Limitations  Lift;Squat;Stand;Transfers    Examination-Participation Restrictions  Driving    Stability/Clinical Decision Making  Evolving/Moderate complexity    Rehab Potential  Good    PT Frequency  2x / week    PT Duration  6 weeks    PT Treatment/Interventions  Neuromuscular re-education;Stair training;Moist Heat;Cryotherapy;Iontophoresis 4mg /ml Dexamethasone;Electrical Stimulation;DME Instruction;Therapeutic activities;Therapeutic exercise;Balance training;Manual techniques;Patient/family education;Passive range of motion;Dry needling;Joint Manipulations    PT Next Visit Plan  progress hip strengthening, use of SPC    PT Home Exercise Plan  See education section    Consulted and Agree with Plan of Care  Patient       Patient will benefit from skilled therapeutic intervention in order to improve the following deficits and impairments:  Abnormal gait, Decreased activity tolerance, Decreased endurance, Decreased range of motion, Decreased strength, Decreased balance, Difficulty walking, Decreased cognition, Pain, Decreased coordination, Increased muscle spasms, Postural dysfunction, Hypomobility  Visit Diagnosis: Unsteadiness on feet  History of falling  Pain in right hip     Problem List Patient Active Problem List   Diagnosis Date Noted  . Closed right hip fracture ([place]) 09/20/2019  . Gout 05/18/2019  . Clavi 03/09/2019  . Right hip pain 07/08/2018  . Chronic bilateral low back pain without sciatica 12/31/2017  . Atrial fibrillation ([place]) 09/10/2017  . Trapezius strain, right, initial encounter 09/10/2017  . Depression, major, single episode, mild ([place]) 09/10/2017  . Chronic venous  insufficiency 09/09/2017  . Lymphedema 09/09/2017  . Pain of toe of left foot 08/17/2017  . Leg swelling 07/31/2017  . Dry eyes 07/06/2017  . Pain and swelling of lower extremity, right 03/29/2017  . Right shoulder pain 03/29/2017  . Diarrhea 05/18/2016  . Fall 05/01/2016  . Leg weakness, bilateral 12/30/2015  . Macrocytic anemia 05/19/2013  . Psoriatic arthritis ([place]) 02/03/2013  . Right hand pain 08/05/2012  . Psoriasis   . Osteoarthritis, multiple sites   . History of breast cancer 10/28/2011  . GERD (gastroesophageal reflux disease)   . Hypertension   . Osteopenia   . Carotid stenosis 05/27/2011    Blythe Stanford, PT DPT 11/13/2019, 11:58 AM  [facility] [address] [place], [address] Phone: [phone]   Fax:  [phone]  Name: [name] MRN: [id number] Date of Birth: Aug 26, 1940

## 2019-11-16 ENCOUNTER — Ambulatory Visit: Payer: Medicare Other

## 2019-11-16 ENCOUNTER — Other Ambulatory Visit: Payer: Self-pay

## 2019-11-16 DIAGNOSIS — R2681 Unsteadiness on feet: Secondary | ICD-10-CM

## 2019-11-16 DIAGNOSIS — M545 Low back pain, unspecified: Secondary | ICD-10-CM

## 2019-11-16 DIAGNOSIS — Z9181 History of falling: Secondary | ICD-10-CM

## 2019-11-16 DIAGNOSIS — M6281 Muscle weakness (generalized): Secondary | ICD-10-CM

## 2019-11-16 DIAGNOSIS — M25551 Pain in right hip: Secondary | ICD-10-CM | POA: Diagnosis not present

## 2019-11-16 DIAGNOSIS — M25511 Pain in right shoulder: Secondary | ICD-10-CM

## 2019-11-16 NOTE — Therapy (Signed)
[facility] [address] [place], [address] Phone: [phone]   Fax:  [phone]  Physical Therapy Treatment  Patient Details  Name: [name] MRN: [id number] Date of Birth: 06-26-1941 Referring Provider (PT): Tamala Julian   Encounter Date: 11/16/2019  PT End of Session - 11/16/19 1311    Visit Number  16    Number of Visits  21    Date for PT Re-Evaluation  12/14/19    Authorization Type  6/10 Medicare    PT Start Time  [id number]    PT Stop Time  [id number]    PT Time Calculation (min)  40 min    Activity Tolerance  Patient tolerated treatment well;No increased pain    Behavior During Therapy  WFL for tasks assessed/performed       Past Medical History:  Diagnosis Date  . (HFpEF) heart failure with preserved ejection fraction ([place])    a. 08/2017 Echo: EF 55-60%, no rwma, mild MR, mildly dil LA, nl RV fxn.  . Breast cancer ([place]) 2001   left breast  . Cancer ([place]) 2001   left breast ca  . Carotid arterial disease ([place])    a. 10/2004 s/p L CEA; b. 12/2015 Carotid U/S: RICA 1-39%; b. LICA patent CEA site.  Marland Kitchen GERD (gastroesophageal reflux disease)   . Hyperlipidemia   . Hypertension   . Osteoarthritis, multiple sites   . Osteopenia   . Persistent atrial fibrillation ([place])    a. Dx 08/2017; b. CHA2DS2VASc = 6-->Xarelto; c. 09/2017 Successful DCCV (second shock - 200J);   Marland Kitchen Personal history of radiation therapy 2001   left breast ca  . Psoriasis     Past Surgical History:  Procedure Laterality Date  . ABDOMINAL HYSTERECTOMY    . ANKLE FRACTURE SURGERY  4/08   left---hardware still in place  . 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 BREAST EXCISIONAL BIOPSY Left yrs ago   benign  . BREAST LUMPECTOMY Left 2001   f/u radiation  . CARDIOVERSION N/A 10/04/2017   Procedure: CARDIOVERSION;  Surgeon: Wellington Hampshire, MD;  Location: [facility] ORS;  Service: Cardiovascular;  Laterality: N/A;  .  CARDIOVERSION N/A 12/16/2017   Procedure: CARDIOVERSION;  Surgeon: [name], MD;  Location: [facility] ORS;  Service: Cardiovascular;  Laterality: N/A;  . CAROTID ENDARTERECTOMY Left 10/23/2004  . FRACTURE SURGERY    . OOPHORECTOMY    . SHOULDER SURGERY  6/07   left  . TONSILLECTOMY AND ADENOIDECTOMY    . TOTAL HIP ARTHROPLASTY  2004   right    There were no vitals filed for this visit.  Subjective Assessment - 11/16/19 1306    Subjective  Pt reports no updates today. Still has some pain that parallels with exertsion/effort.    Pertinent History  Patient previously seen for balance difficulties, shoulder pain and the right, and low back pain.  Patient reports no falls in the past six months. Patient reports pain has not improved for the past month and has been about the same since the onset of pain.    Currently in Pain?  No/denies       TREATMENT Therapeutic Exercise -Sit to stands with use of TRX straps - 3 x 10  -Side stepping across airex beam 2x3"sec  -Sit to stands with UE support with airex on seat - 1x12 -Semi-circles in standing with furniture slider - 2 x 10  -Side stepping with YTB along knees -  30ft x 2 B    PT Short Term Goals - 10/26/19 1540      PT SHORT TERM GOAL #1   Title  Patient will have a worst pain score of 6/10 to better be able to drive with less overall pain    Baseline  8/10; 10/26/2019: 6/10    Time  3    Period  Weeks    Status  On-going    Target Date  10/09/19        PT Long Term Goals - 11/02/19 0826      PT LONG TERM GOAL #1   Title  Patient will be independent with HEP to continue benefits of therapy after discharge.     Baseline  Dependent with form and technique for exercises; 10/26/2019: cueing needed to complete HEP    Time  6    Period  Weeks    Status  On-going      PT LONG TERM GOAL #2   Title  Patient will improve 24mWT to over 1 m/s to indicate increased safety with ambulating in a community environment.    Baseline  .66  m/s; 10/26/2019: .75 m/s    Time  6    Period  Weeks    Status  On-going      PT LONG TERM GOAL #3   Title  Patient will improve FGA to 63 to indicate significant functional improvement of R hip function.    Baseline  FGA: 44; 10/26/2019: FGA: 48    Time  6    Period  Weeks    Status  On-going      PT LONG TERM GOAL #4   Title  Patient will be able to balance for 10sec with SLS to improve static balance and ability to get dressed when in standing    Baseline  2.5 sec B; 10/26/2019: 2.5 sec    Time  6    Period  Weeks    Status  On-going      PT LONG TERM GOAL #5   Title  Patient will have a worst hip pain of a 3/10 over the past week to indicate functional improvement with transferring and driving    Baseline  worst pain: 8/10; 10/26/2019: 6/10    Time  6    Period  Weeks    Status  On-going            Plan - 11/16/19 1312    Clinical Impression Statement  Pt able to complete entire session as planned with rest breaks provided as needed. Pt maintains high level of focus and motivation. Extensive verbal, visual, and tactile cues are provided for most accurate form possible. Author provides minA intermittently for full ROM when needed. Overall pt continues to make steady progress toward treatment goals.    Personal Factors and Comorbidities  Age;Comorbidity 2    Comorbidities  hip fx, RA    Examination-Activity Limitations  Lift;Squat;Stand;Transfers    Examination-Participation Restrictions  Driving    Stability/Clinical Decision Making  Evolving/Moderate complexity    Clinical Decision Making  Moderate    Rehab Potential  Good    PT Frequency  2x / week    PT Duration  6 weeks    PT Treatment/Interventions  Neuromuscular re-education;Stair training;Moist Heat;Cryotherapy;Iontophoresis 4mg /ml Dexamethasone;Electrical Stimulation;DME Instruction;Therapeutic activities;Therapeutic exercise;Balance training;Manual techniques;Patient/family education;Passive range of motion;Dry  needling;Joint Manipulations    PT Next Visit Plan  progress hip strengthening, use of SPC    PT Home Exercise Plan  See education section    Consulted and Agree with Plan of Care  Patient       Patient will benefit from skilled therapeutic intervention in order to improve the following deficits and impairments:  Abnormal gait, Decreased activity tolerance, Decreased endurance, Decreased range of motion, Decreased strength, Decreased balance, Difficulty walking, Decreased cognition, Pain, Decreased coordination, Increased muscle spasms, Postural dysfunction, Hypomobility  Visit Diagnosis: Unsteadiness on feet  History of falling  Pain in right hip  Acute bilateral low back pain without sciatica  Muscle weakness (generalized)  Acute pain of right shoulder     Problem List Patient Active Problem List   Diagnosis Date Noted  . Closed right hip fracture ([place]) 09/20/2019  . Gout 05/18/2019  . Clavi 03/09/2019  . Right hip pain 07/08/2018  . Chronic bilateral low back pain without sciatica 12/31/2017  . Atrial fibrillation ([place]) 09/10/2017  . Trapezius strain, right, initial encounter 09/10/2017  . Depression, major, single episode, mild ([place]) 09/10/2017  . Chronic venous insufficiency 09/09/2017  . Lymphedema 09/09/2017  . Pain of toe of left foot 08/17/2017  . Leg swelling 07/31/2017  . Dry eyes 07/06/2017  . Pain and swelling of lower extremity, right 03/29/2017  . Right shoulder pain 03/29/2017  . Diarrhea 05/18/2016  . Fall 05/01/2016  . Leg weakness, bilateral 12/30/2015  . Macrocytic anemia 05/19/2013  . Psoriatic arthritis ([place]) 02/03/2013  . Right hand pain 08/05/2012  . Psoriasis   . Osteoarthritis, multiple sites   . History of breast cancer 10/28/2011  . GERD (gastroesophageal reflux disease)   . Hypertension   . Osteopenia   . Carotid stenosis 05/27/2011   1:46 PM, 11/16/19 Etta Grandchild, PT, DPT Physical Therapist - [place] [phone]  (Office)   Macrina Lehnert C 11/16/2019, 1:15 PM  [facility] [address] [place], [address] Phone: [phone]   Fax:  [phone]  Name: [name] MRN: [id number] Date of Birth: Nov 16, 1940

## 2019-11-22 ENCOUNTER — Other Ambulatory Visit: Payer: Self-pay

## 2019-11-22 ENCOUNTER — Ambulatory Visit: Payer: Medicare Other | Attending: Orthopedic Surgery

## 2019-11-22 DIAGNOSIS — M25551 Pain in right hip: Secondary | ICD-10-CM

## 2019-11-22 DIAGNOSIS — R2681 Unsteadiness on feet: Secondary | ICD-10-CM | POA: Diagnosis not present

## 2019-11-22 DIAGNOSIS — Z9181 History of falling: Secondary | ICD-10-CM | POA: Diagnosis not present

## 2019-11-22 NOTE — Therapy (Signed)
[facility] [address], [place], [address] Phone: [phone]   Fax:  [phone]  Physical Therapy Treatment  Patient Details  Name: [name] MRN: [id number] Date of Birth: 04-02-1941 Referring Provider (PT): Tamala Julian   Encounter Date: 11/22/2019  PT End of Session - 11/22/19 1711    Visit Number  17    Number of Visits  21    Date for PT Re-Evaluation  12/14/19    Authorization Type  7/10 Medicare    PT Start Time  [id number]    PT Stop Time  [id number]    PT Time Calculation (min)  41 min    Activity Tolerance  Patient tolerated treatment well;No increased pain    Behavior During Therapy  WFL for tasks assessed/performed       Past Medical History:  Diagnosis Date  . (HFpEF) heart failure with preserved ejection fraction ([place])    a. 08/2017 Echo: EF 55-60%, no rwma, mild MR, mildly dil LA, nl RV fxn.  . Breast cancer ([place]) 2001   left breast  . Cancer ([place]) 2001   left breast ca  . Carotid arterial disease ([place])    a. 10/2004 s/p L CEA; b. 12/2015 Carotid U/S: RICA 1-39%; b. LICA patent CEA site.  Marland Kitchen GERD (gastroesophageal reflux disease)   . Hyperlipidemia   . Hypertension   . Osteoarthritis, multiple sites   . Osteopenia   . Persistent atrial fibrillation ([place])    a. Dx 08/2017; b. CHA2DS2VASc = 6-->Xarelto; c. 09/2017 Successful DCCV (second shock - 200J);   Marland Kitchen Personal history of radiation therapy 2001   left breast ca  . Psoriasis     Past Surgical History:  Procedure Laterality Date  . ABDOMINAL HYSTERECTOMY    . ANKLE FRACTURE SURGERY  4/08   left---hardware still in place  . 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 BREAST EXCISIONAL BIOPSY Left yrs ago   benign  . BREAST LUMPECTOMY Left 2001   f/u radiation  . CARDIOVERSION N/A 10/04/2017   Procedure: CARDIOVERSION;  Surgeon: Wellington Hampshire, MD;  Location: [facility] ORS;  Service: Cardiovascular;  Laterality: N/A;  . CARDIOVERSION  N/A 12/16/2017   Procedure: CARDIOVERSION;  Surgeon: [name], MD;  Location: [facility] ORS;  Service: Cardiovascular;  Laterality: N/A;  . CAROTID ENDARTERECTOMY Left 10/23/2004  . FRACTURE SURGERY    . OOPHORECTOMY    . SHOULDER SURGERY  6/07   left  . TONSILLECTOMY AND ADENOIDECTOMY    . TOTAL HIP ARTHROPLASTY  2004   right    There were no vitals filed for this visit.  Subjective Assessment - 11/22/19 1705    Subjective  Patient reports no signficant changes in the hip. Patient states her pain has been improving overall.    Pertinent History  Patient previously seen for balance difficulties, shoulder pain and the right, and low back pain.  Patient reports no falls in the past six months. Patient reports pain has not improved for the past month and has been about the same since the onset of pain.    Patient Stated Goals  To improve balance when walking, driving pain    Currently in Pain?  No/denies          TREATMENT Therapeutic Exercise Sit to stands with use of TRX straps - 3 x 10  Leg swings laterally with UE support - x 4 min Leg swings forward/backward B with UE  support - x 2 min  Side stepping across airex beam - x 27ft Tandem walking on airex beam - x 85ft B Forward stepping onto 6" step - x 12 B     Performed exercises to improve LE and hip strengthening   PT Education - 11/22/19 1710    Education Details  form/technique with exercise    Person(s) Educated  Patient    Methods  Explanation;Demonstration    Comprehension  Verbalized understanding;Returned demonstration       PT Short Term Goals - 10/26/19 1540      PT SHORT TERM GOAL #1   Title  Patient will have a worst pain score of 6/10 to better be able to drive with less overall pain    Baseline  8/10; 10/26/2019: 6/10    Time  3    Period  Weeks    Status  On-going    Target Date  10/09/19        PT Long Term Goals - 11/02/19 0826      PT LONG TERM GOAL #1   Title  Patient will be independent  with HEP to continue benefits of therapy after discharge.     Baseline  Dependent with form and technique for exercises; 10/26/2019: cueing needed to complete HEP    Time  6    Period  Weeks    Status  On-going      PT LONG TERM GOAL #2   Title  Patient will improve 32mWT to over 1 m/s to indicate increased safety with ambulating in a community environment.    Baseline  .66 m/s; 10/26/2019: .75 m/s    Time  6    Period  Weeks    Status  On-going      PT LONG TERM GOAL #3   Title  Patient will improve FGA to 63 to indicate significant functional improvement of R hip function.    Baseline  FGA: 44; 10/26/2019: FGA: 48    Time  6    Period  Weeks    Status  On-going      PT LONG TERM GOAL #4   Title  Patient will be able to balance for 10sec with SLS to improve static balance and ability to get dressed when in standing    Baseline  2.5 sec B; 10/26/2019: 2.5 sec    Time  6    Period  Weeks    Status  On-going      PT LONG TERM GOAL #5   Title  Patient will have a worst hip pain of a 3/10 over the past week to indicate functional improvement with transferring and driving    Baseline  worst pain: 8/10; 10/26/2019: 6/10    Time  6    Period  Weeks    Status  On-going            Plan - 11/22/19 1718    Clinical Impression Statement  Patient demonstrates improvement with ability to perform side stepping with less overall support required to perform. Continued to focus on improving quad and hip strength with exercise, patient demonstrates decreased ability to perform squatting motions into deeper ranges of motion. Patient will benefit from further skiled therapy to return to prior level of function.    Personal Factors and Comorbidities  Age;Comorbidity 2    Comorbidities  hip fx, RA    Examination-Activity Limitations  Lift;Squat;Stand;Transfers    Examination-Participation Restrictions  Driving    Stability/Clinical Decision Making  Evolving/Moderate complexity    Rehab Potential  Good     PT Frequency  2x / week    PT Duration  6 weeks    PT Treatment/Interventions  Neuromuscular re-education;Stair training;Moist Heat;Cryotherapy;Iontophoresis 4mg /ml Dexamethasone;Electrical Stimulation;DME Instruction;Therapeutic activities;Therapeutic exercise;Balance training;Manual techniques;Patient/family education;Passive range of motion;Dry needling;Joint Manipulations    PT Next Visit Plan  progress hip strengthening, use of SPC    PT Home Exercise Plan  See education section    Consulted and Agree with Plan of Care  Patient       Patient will benefit from skilled therapeutic intervention in order to improve the following deficits and impairments:  Abnormal gait, Decreased activity tolerance, Decreased endurance, Decreased range of motion, Decreased strength, Decreased balance, Difficulty walking, Decreased cognition, Pain, Decreased coordination, Increased muscle spasms, Postural dysfunction, Hypomobility  Visit Diagnosis: Unsteadiness on feet  History of falling  Pain in right hip     Problem List Patient Active Problem List   Diagnosis Date Noted  . Closed right hip fracture ([place]) 09/20/2019  . Gout 05/18/2019  . Clavi 03/09/2019  . Right hip pain 07/08/2018  . Chronic bilateral low back pain without sciatica 12/31/2017  . Atrial fibrillation ([place]) 09/10/2017  . Trapezius strain, right, initial encounter 09/10/2017  . Depression, major, single episode, mild ([place]) 09/10/2017  . Chronic venous insufficiency 09/09/2017  . Lymphedema 09/09/2017  . Pain of toe of left foot 08/17/2017  . Leg swelling 07/31/2017  . Dry eyes 07/06/2017  . Pain and swelling of lower extremity, right 03/29/2017  . Right shoulder pain 03/29/2017  . Diarrhea 05/18/2016  . Fall 05/01/2016  . Leg weakness, bilateral 12/30/2015  . Macrocytic anemia 05/19/2013  . Psoriatic arthritis ([place]) 02/03/2013  . Right hand pain 08/05/2012  . Psoriasis   . Osteoarthritis, multiple sites   . History  of breast cancer 10/28/2011  . GERD (gastroesophageal reflux disease)   . Hypertension   . Osteopenia   . Carotid stenosis 05/27/2011    Blythe Stanford, PT DPT 11/22/2019, 5:42 PM  [facility] [address] [place], [address] Phone: [phone]   Fax:  [phone]  Name: [name] MRN: [id number] Date of Birth: [birth year]

## 2019-11-23 ENCOUNTER — Ambulatory Visit: Payer: Medicare Other | Admitting: Internal Medicine

## 2019-11-28 ENCOUNTER — Other Ambulatory Visit: Payer: Self-pay

## 2019-11-28 ENCOUNTER — Ambulatory Visit: Payer: Medicare Other

## 2019-11-28 DIAGNOSIS — Z9181 History of falling: Secondary | ICD-10-CM

## 2019-11-28 DIAGNOSIS — R2681 Unsteadiness on feet: Secondary | ICD-10-CM

## 2019-11-28 DIAGNOSIS — M25551 Pain in right hip: Secondary | ICD-10-CM | POA: Diagnosis not present

## 2019-11-28 NOTE — Therapy (Signed)
[facility] [address], [place], [address] Phone: [phone]   Fax:  [phone]  Physical Therapy Treatment  Patient Details  Name: [name] MRN: [id number] Date of Birth: 1940-12-01 Referring Provider (PT): Tamala Julian   Encounter Date: 11/28/2019  PT End of Session - 11/28/19 0958    Visit Number  18    Number of Visits  21    Date for PT Re-Evaluation  12/14/19    Authorization Type  8/10 Medicare    PT Start Time  0945    PT Stop Time  1030    PT Time Calculation (min)  45 min    Activity Tolerance  Patient tolerated treatment well;No increased pain    Behavior During Therapy  WFL for tasks assessed/performed       Past Medical History:  Diagnosis Date  . (HFpEF) heart failure with preserved ejection fraction ([place])    a. 08/2017 Echo: EF 55-60%, no rwma, mild MR, mildly dil LA, nl RV fxn.  . Breast cancer ([place]) 2001   left breast  . Cancer ([place]) 2001   left breast ca  . Carotid arterial disease ([place])    a. 10/2004 s/p L CEA; b. 12/2015 Carotid U/S: RICA 1-39%; b. LICA patent CEA site.  Marland Kitchen GERD (gastroesophageal reflux disease)   . Hyperlipidemia   . Hypertension   . Osteoarthritis, multiple sites   . Osteopenia   . Persistent atrial fibrillation ([place])    a. Dx 08/2017; b. CHA2DS2VASc = 6-->Xarelto; c. 09/2017 Successful DCCV (second shock - 200J);   Marland Kitchen Personal history of radiation therapy 2001   left breast ca  . Psoriasis     Past Surgical History:  Procedure Laterality Date  . ABDOMINAL HYSTERECTOMY    . ANKLE FRACTURE SURGERY  4/08   left---hardware still in place  . 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 BREAST EXCISIONAL BIOPSY Left yrs ago   benign  . BREAST LUMPECTOMY Left 2001   f/u radiation  . CARDIOVERSION N/A 10/04/2017   Procedure: CARDIOVERSION;  Surgeon: Wellington Hampshire, MD;  Location: [facility] OR[facility];  Service: Cardiovascular;  Laterality: N/A;  .  CARDIOVERSION N/A 12/16/2017   Procedure: CARDIOVERSION;  Surgeon: [name], MD;  Location: [facility] OR[facility];  Service: Cardiovascular;  Laterality: N/A;  . CAROTID ENDARTERECTOMY Left 10/23/2004  . FRACTURE SURGERY    . OOPHORECTOMY    . SHOULDER SURGERY  6/07   left  . TONSILLECTOMY AND ADENOIDECTOMY    . TOTAL HIP ARTHROPLASTY  2004   right    There were no vitals filed for this visit.  Subjective Assessment - 11/28/19 0952    Subjective  Patient states improvement in walking and states she continues to have difficulty balancing and moving after sitting for prolonger periods of time.    Pertinent History  Patient previously seen for balance difficulties, shoulder pain and the right, and low back pain.  Patient reports no falls in the past six months. Patient reports pain has not improved for the past month and has been about the same since the onset of pain.    Patient Stated Goals  To improve balance when walking, driving pain    Currently in Pain?  No/denies          TREATMENT Therapeutic Exercise Sit to stands with use of TRX straps - 2 x 10  Leg swings laterally with UE support - x 2 min  Leg swings forward/backward B with UE support - x 2 min  Side stepping up and over airex pad - x 10 without UE support Square stepping with YTB around knees - x 20  Side stepping on airex beam - 4 x 83ft Tandem walking on airex beam - 4 x 37ft B Mini Lunges in standing with UE support - x 15     Performed exercises to improve LE and hip strengthening   PT Education - 11/28/19 0957    Education Details  form/technique with exercise    Person(s) Educated  Patient    Methods  Explanation;Demonstration    Comprehension  Returned demonstration;Verbalized understanding       PT Short Term Goals - 10/26/19 1540      PT SHORT TERM GOAL #1   Title  Patient will have a worst pain score of 6/10 to better be able to drive with less overall pain    Baseline  8/10; 10/26/2019: 6/10    Time  3     Period  Weeks    Status  On-going    Target Date  10/09/19        PT Long Term Goals - 11/02/19 0826      PT LONG TERM GOAL #1   Title  Patient will be independent with HEP to continue benefits of therapy after discharge.     Baseline  Dependent with form and technique for exercises; 10/26/2019: cueing needed to complete HEP    Time  6    Period  Weeks    Status  On-going      PT LONG TERM GOAL #2   Title  Patient will improve 40mWT to over 1 m/s to indicate increased safety with ambulating in a community environment.    Baseline  .66 m/s; 10/26/2019: .75 m/s    Time  6    Period  Weeks    Status  On-going      PT LONG TERM GOAL #3   Title  Patient will improve FGA to 63 to indicate significant functional improvement of R hip function.    Baseline  FGA: 44; 10/26/2019: FGA: 48    Time  6    Period  Weeks    Status  On-going      PT LONG TERM GOAL #4   Title  Patient will be able to balance for 10sec with SLS to improve static balance and ability to get dressed when in standing    Baseline  2.5 sec B; 10/26/2019: 2.5 sec    Time  6    Period  Weeks    Status  On-going      PT LONG TERM GOAL #5   Title  Patient will have a worst hip pain of a 3/10 over the past week to indicate functional improvement with transferring and driving    Baseline  worst pain: 8/10; 10/26/2019: 6/10    Time  6    Period  Weeks    Status  On-going            Plan - 11/28/19 0959    Clinical Impression Statement  COnitnued to focus on improving dynamic balance and hip strengh during today's session. Patient requires UE support for performing exercises along compliant surfaces, however she was able to perform side stepping without the need for UE support which is an improvement compared to the previous session. Patient will benefit from further skilled therapy to return to prior level of function.  Personal Factors and Comorbidities  Age;Comorbidity 2    Comorbidities  hip fx, RA     Examination-Activity Limitations  Lift;Squat;Stand;Transfers    Examination-Participation Restrictions  Driving    Stability/Clinical Decision Making  Evolving/Moderate complexity    Rehab Potential  Good    PT Frequency  2x / week    PT Duration  6 weeks    PT Treatment/Interventions  Neuromuscular re-education;Stair training;Moist Heat;Cryotherapy;Iontophoresis 4mg /ml Dexamethasone;Electrical Stimulation;DME Instruction;Therapeutic activities;Therapeutic exercise;Balance training;Manual techniques;Patient/family education;Passive range of motion;Dry needling;Joint Manipulations    PT Next Visit Plan  progress hip strengthening, use of SPC    PT Home Exercise Plan  See education section    Consulted and Agree with Plan of Care  Patient       Patient will benefit from skilled therapeutic intervention in order to improve the following deficits and impairments:  Abnormal gait, Decreased activity tolerance, Decreased endurance, Decreased range of motion, Decreased strength, Decreased balance, Difficulty walking, Decreased cognition, Pain, Decreased coordination, Increased muscle spasms, Postural dysfunction, Hypomobility  Visit Diagnosis: Unsteadiness on feet  History of falling  Pain in right hip     Problem List Patient Active Problem List   Diagnosis Date Noted  . Closed right hip fracture ([place]) 09/20/2019  . Gout 05/18/2019  . Clavi 03/09/2019  . Right hip pain 07/08/2018  . Chronic bilateral low back pain without sciatica 12/31/2017  . Atrial fibrillation ([place]) 09/10/2017  . Trapezius strain, right, initial encounter 09/10/2017  . Depression, major, single episode, mild ([place]) 09/10/2017  . Chronic venous insufficiency 09/09/2017  . Lymphedema 09/09/2017  . Pain of toe of left foot 08/17/2017  . Leg swelling 07/31/2017  . Dry eyes 07/06/2017  . Pain and swelling of lower extremity, right 03/29/2017  . Right shoulder pain 03/29/2017  . Diarrhea 05/18/2016  . Fall  05/01/2016  . Leg weakness, bilateral 12/30/2015  . Macrocytic anemia 05/19/2013  . Psoriatic arthritis ([place]) 02/03/2013  . Right hand pain 08/05/2012  . Psoriasis   . Osteoarthritis, multiple sites   . History of breast cancer 10/28/2011  . GERD (gastroesophageal reflux disease)   . Hypertension   . Osteopenia   . Carotid stenosis 05/27/2011    Blythe Stanford, PT DPT 11/28/2019, 10:27 AM  [facility] [address], [place], [address] Phone: [phone]   Fax:  [phone]  Name: [name] MRN: [id number] Date of Birth: 08-Sep-1940

## 2019-12-01 ENCOUNTER — Other Ambulatory Visit: Payer: Self-pay | Admitting: Family Medicine

## 2019-12-01 NOTE — Telephone Encounter (Signed)
Refill request for ativan, last seen 09-20-19, last filled 06-29-19.  Please advise.

## 2019-12-04 ENCOUNTER — Other Ambulatory Visit: Payer: Self-pay

## 2019-12-04 ENCOUNTER — Ambulatory Visit: Payer: Medicare Other

## 2019-12-04 DIAGNOSIS — Z9181 History of falling: Secondary | ICD-10-CM

## 2019-12-04 DIAGNOSIS — M25551 Pain in right hip: Secondary | ICD-10-CM

## 2019-12-04 DIAGNOSIS — R2681 Unsteadiness on feet: Secondary | ICD-10-CM

## 2019-12-04 NOTE — Therapy (Signed)
[facility] [address], [place], [address] Phone: [phone]   Fax:  [phone]  Physical Therapy Treatment  Patient Details  Name: [name] MRN: [id number] Date of Birth: 1940-07-21 Referring Provider (PT): Tamala Julian   Encounter Date: 12/04/2019  PT End of Session - 12/04/19 1633    Visit Number  19    Number of Visits  21    Date for PT Re-Evaluation  12/14/19    Authorization Type  9/10 Medicare    PT Start Time  1600    PT Stop Time  [id number]    PT Time Calculation (min)  45 min    Activity Tolerance  Patient tolerated treatment well;No increased pain    Behavior During Therapy  WFL for tasks assessed/performed       Past Medical History:  Diagnosis Date  . (HFpEF) heart failure with preserved ejection fraction ([place])    a. 08/2017 Echo: EF 55-60%, no rwma, mild MR, mildly dil LA, nl RV fxn.  . Breast cancer ([place]) 2001   left breast  . Cancer ([place]) 2001   left breast ca  . Carotid arterial disease ([place])    a. 10/2004 s/p L CEA; b. 12/2015 Carotid U/S: RICA 1-39%; b. LICA patent CEA site.  Marland Kitchen GERD (gastroesophageal reflux disease)   . Hyperlipidemia   . Hypertension   . Osteoarthritis, multiple sites   . Osteopenia   . Persistent atrial fibrillation ([place])    a. Dx 08/2017; b. CHA2DS2VASc = 6-->Xarelto; c. 09/2017 Successful DCCV (second shock - 200J);   Marland Kitchen Personal history of radiation therapy 2001   left breast ca  . Psoriasis     Past Surgical History:  Procedure Laterality Date  . ABDOMINAL HYSTERECTOMY    . ANKLE FRACTURE SURGERY  4/08   left---hardware still in place  . 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 BREAST EXCISIONAL BIOPSY Left yrs ago   benign  . BREAST LUMPECTOMY Left 2001   f/u radiation  . CARDIOVERSION N/A 10/04/2017   Procedure: CARDIOVERSION;  Surgeon: Wellington Hampshire, MD;  Location: [facility] ORS;  Service: Cardiovascular;  Laterality: N/A;  .  CARDIOVERSION N/A 12/16/2017   Procedure: CARDIOVERSION;  Surgeon: [name], MD;  Location: [facility] ORS;  Service: Cardiovascular;  Laterality: N/A;  . CAROTID ENDARTERECTOMY Left 10/23/2004  . FRACTURE SURGERY    . OOPHORECTOMY    . SHOULDER SURGERY  6/07   left  . TONSILLECTOMY AND ADENOIDECTOMY    . TOTAL HIP ARTHROPLASTY  2004   right    There were no vitals filed for this visit.  Subjective Assessment - 12/04/19 1617    Subjective  Patient states she has been improving overall. She states she had increased difficulty with walking over compliant surfaces.    Pertinent History  Patient previously seen for balance difficulties, shoulder pain and the right, and low back pain.  Patient reports no falls in the past six months. Patient reports pain has not improved for the past month and has been about the same since the onset of pain.    Patient Stated Goals  To improve balance when walking, driving pain    Currently in Pain?  No/denies           TREATMENT Therapeutic Exercise Sit to stands with use of TRX straps - 2x20  Leg swings forward/backward B with UE support - x 2 min  Mini Lunges in  standing with UE support - x 20 Tandem Stance in standing - 2 min b Side stepping with RTB around knees - 3 x 54ft Cone taps on airex pad - x 20  Feet together balance on airex pad  Feet together balance on airex pad with weight shifts forward/backward/laterally - x 10  Step ups on 6" step - x 10 without UE support    Performed exercises to improve LE and hip strengthening   PT Education - 12/04/19 1632    Education Details  form/technique with exercise    Person(s) Educated  Patient    Methods  Explanation;Demonstration    Comprehension  Verbalized understanding;Returned demonstration       PT Short Term Goals - 10/26/19 1540      PT SHORT TERM GOAL #1   Title  Patient will have a worst pain score of 6/10 to better be able to drive with less overall pain    Baseline  8/10;  10/26/2019: 6/10    Time  3    Period  Weeks    Status  On-going    Target Date  10/09/19        PT Long Term Goals - 11/02/19 0826      PT LONG TERM GOAL #1   Title  Patient will be independent with HEP to continue benefits of therapy after discharge.     Baseline  Dependent with form and technique for exercises; 10/26/2019: cueing needed to complete HEP    Time  6    Period  Weeks    Status  On-going      PT LONG TERM GOAL #2   Title  Patient will improve 55mWT to over 1 m/s to indicate increased safety with ambulating in a community environment.    Baseline  .66 m/s; 10/26/2019: .75 m/s    Time  6    Period  Weeks    Status  On-going      PT LONG TERM GOAL #3   Title  Patient will improve FGA to 63 to indicate significant functional improvement of R hip function.    Baseline  FGA: 44; 10/26/2019: FGA: 48    Time  6    Period  Weeks    Status  On-going      PT LONG TERM GOAL #4   Title  Patient will be able to balance for 10sec with SLS to improve static balance and ability to get dressed when in standing    Baseline  2.5 sec B; 10/26/2019: 2.5 sec    Time  6    Period  Weeks    Status  On-going      PT LONG TERM GOAL #5   Title  Patient will have a worst hip pain of a 3/10 over the past week to indicate functional improvement with transferring and driving    Baseline  worst pain: 8/10; 10/26/2019: 6/10    Time  6    Period  Weeks    Status  On-going            Plan - 12/04/19 1644    Clinical Impression Statement  Patient demonstrates improvement with squatting ability today as she was able to perform greater amount of repetitions compared to the previous session indicating functional carryover between sessions. Although patient is improving she continues to have difficulty with performing full range of motion lunges indicating decreased strength and dynamic stabilization of the hip musculature. Patient will benefit from further skilled therapy  focused on improving  limitations to return to prior level of function.    Personal Factors and Comorbidities  Age;Comorbidity 2    Comorbidities  hip fx, RA    Examination-Activity Limitations  Lift;Squat;Stand;Transfers    Examination-Participation Restrictions  Driving    Stability/Clinical Decision Making  Evolving/Moderate complexity    Rehab Potential  Good    PT Frequency  2x / week    PT Duration  6 weeks    PT Treatment/Interventions  Neuromuscular re-education;Stair training;Moist Heat;Cryotherapy;Iontophoresis 4mg /ml Dexamethasone;Electrical Stimulation;DME Instruction;Therapeutic activities;Therapeutic exercise;Balance training;Manual techniques;Patient/family education;Passive range of motion;Dry needling;Joint Manipulations    PT Next Visit Plan  progress hip strengthening, use of SPC    PT Home Exercise Plan  See education section    Consulted and Agree with Plan of Care  Patient       Patient will benefit from skilled therapeutic intervention in order to improve the following deficits and impairments:  Abnormal gait, Decreased activity tolerance, Decreased endurance, Decreased range of motion, Decreased strength, Decreased balance, Difficulty walking, Decreased cognition, Pain, Decreased coordination, Increased muscle spasms, Postural dysfunction, Hypomobility  Visit Diagnosis: Unsteadiness on feet  History of falling  Pain in right hip     Problem List Patient Active Problem List   Diagnosis Date Noted  . Closed right hip fracture ([place]) 09/20/2019  . Gout 05/18/2019  . Clavi 03/09/2019  . Right hip pain 07/08/2018  . Chronic bilateral low back pain without sciatica 12/31/2017  . Atrial fibrillation ([place]) 09/10/2017  . Trapezius strain, right, initial encounter 09/10/2017  . Depression, major, single episode, mild ([place]) 09/10/2017  . Chronic venous insufficiency 09/09/2017  . Lymphedema 09/09/2017  . Pain of toe of left foot 08/17/2017  . Leg swelling 07/31/2017  . Dry eyes  07/06/2017  . Pain and swelling of lower extremity, right 03/29/2017  . Right shoulder pain 03/29/2017  . Diarrhea 05/18/2016  . Fall 05/01/2016  . Leg weakness, bilateral 12/30/2015  . Macrocytic anemia 05/19/2013  . Psoriatic arthritis ([place]) 02/03/2013  . Right hand pain 08/05/2012  . Psoriasis   . Osteoarthritis, multiple sites   . History of breast cancer 10/28/2011  . GERD (gastroesophageal reflux disease)   . Hypertension   . Osteopenia   . Carotid stenosis 05/27/2011    Blythe Stanford, PT DPT 12/04/2019, 4:57 PM  [facility] [address] [place], [address] Phone: [phone]   Fax:  [phone]  Name: [name] MRN: [id number] Date of Birth: 07-16-1941

## 2019-12-06 ENCOUNTER — Other Ambulatory Visit: Payer: Self-pay

## 2019-12-06 ENCOUNTER — Ambulatory Visit: Payer: Medicare Other

## 2019-12-06 DIAGNOSIS — Z9181 History of falling: Secondary | ICD-10-CM

## 2019-12-06 DIAGNOSIS — M25551 Pain in right hip: Secondary | ICD-10-CM

## 2019-12-06 DIAGNOSIS — R2681 Unsteadiness on feet: Secondary | ICD-10-CM | POA: Diagnosis not present

## 2019-12-06 NOTE — Therapy (Signed)
[facility] [address], [place], [address] Phone: [phone]   Fax:  [phone]  Physical Therapy Treatment/ Progress Note  Patient Details  Name: [name] MRN: [id number] Date of Birth: 1941/06/27 Referring Provider (PT): Tamala Julian  Reporting period: 11/02/2019 - 12/06/2019  Encounter Date: 12/06/2019  PT End of Session - 12/06/19 1042    Visit Number  20    Number of Visits  21    Date for PT Re-Evaluation  12/14/19    Authorization Type  10/10 Medicare    PT Start Time  1030    PT Stop Time  1115    PT Time Calculation (min)  45 min    Activity Tolerance  Patient tolerated treatment well;No increased pain    Behavior During Therapy  WFL for tasks assessed/performed       Past Medical History:  Diagnosis Date  . (HFpEF) heart failure with preserved ejection fraction ([place])    a. 08/2017 Echo: EF 55-60%, no rwma, mild MR, mildly dil LA, nl RV fxn.  . Breast cancer ([place]) 2001   left breast  . Cancer ([place]) 2001   left breast ca  . Carotid arterial disease ([place])    a. 10/2004 s/p L CEA; b. 12/2015 Carotid U/S: RICA 1-39%; b. LICA patent CEA site.  Marland Kitchen GERD (gastroesophageal reflux disease)   . Hyperlipidemia   . Hypertension   . Osteoarthritis, multiple sites   . Osteopenia   . Persistent atrial fibrillation ([place])    a. Dx 08/2017; b. CHA2DS2VASc = 6-->Xarelto; c. 09/2017 Successful DCCV (second shock - 200J);   Marland Kitchen Personal history of radiation therapy 2001   left breast ca  . Psoriasis     Past Surgical History:  Procedure Laterality Date  . ABDOMINAL HYSTERECTOMY    . ANKLE FRACTURE SURGERY  4/08   left---hardware still in place  . 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 BREAST EXCISIONAL BIOPSY Left yrs ago   benign  . BREAST LUMPECTOMY Left 2001   f/u radiation  . CARDIOVERSION N/A 10/04/2017   Procedure: CARDIOVERSION;  Surgeon: Wellington Hampshire, MD;  Location: [facility];   Service: Cardiovascular;  Laterality: N/A;  . CARDIOVERSION N/A 12/16/2017   Procedure: CARDIOVERSION;  Surgeon: [name], MD;  Location: [facility];  Service: Cardiovascular;  Laterality: N/A;  . CAROTID ENDARTERECTOMY Left 10/23/2004  . FRACTURE SURGERY    . OOPHORECTOMY    . SHOULDER SURGERY  6/07   left  . TONSILLECTOMY AND ADENOIDECTOMY    . TOTAL HIP ARTHROPLASTY  2004   right    There were no vitals filed for this visit.  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 Patient states no major changes since the previous session.    Pertinent History  Patient previously seen for balance difficulties, shoulder pain and the right, and low back pain.  Patient reports no falls in the past six months. Patient reports pain has not improved for the past month and has been about the same since the onset of pain.    Patient Stated Goals  To improve balance when walking, driving pain    Currently in Pain?  No/denies             TREATMENT  Therapeutic Exercise Sit to stands with use of TRX straps - 3 x 10  Leg  swings forward/backward B with UE support - x 2 min  Mini Lunges in standing with UE support - x 20 Walking tandem in standing - 2 min B Ambulation with head turns up/down; L/R - x 20  Ambulation with EC -  2x 19ft Backwards walking - x59ft Stepping over hurdles - x 24 hurdles one LE at one time  Performed exercises to improve LE and hip strengthening  PT Education - 12/06/19 1040    Education Details  form/technique with exercise    Person(s) Educated  Patient    Methods  Explanation;Demonstration    Comprehension  Verbalized understanding;Returned demonstration       PT Short Term Goals - 10/26/19 1540      PT SHORT TERM GOAL #1   Title  Patient will have a worst pain score of 6/10 to better be able to drive with less overall pain    Baseline  8/10; 10/26/2019: 6/10    Time  3    Period  Weeks     Status  On-going    Target Date  10/09/19        PT Long Term Goals - 12/06/19 1043      PT LONG TERM GOAL #1   Title  Patient will be independent with HEP to continue benefits of therapy after discharge.     Baseline  Dependent with form and technique for exercises; 10/26/2019: cueing needed to complete HEP; 12/06/2019:  performing exercises at home    Time  6    Period  Weeks    Status  On-going      PT LONG TERM GOAL #2   Title  Patient will improve 4mWT to over 1 m/s to indicate increased safety with ambulating in a community environment.    Baseline  .66 m/s; 10/26/2019: .75 m/s ; 12/06/2019: . 8 m/s    Time  6    Period  Weeks    Status  On-going      PT LONG TERM GOAL #3   Title  Patient will improve FGA to 63 to indicate significant functional improvement of R hip function.    Baseline  FGA: 44; 10/26/2019: FGA: 48; 5/192/2019: 53    Time  6    Period  Weeks    Status  On-going      PT LONG TERM GOAL #4   Title  Patient will be able to balance for 10sec with SLS to improve static balance and ability to get dressed when in standing    Baseline  2.5 sec B; 10/26/2019: 2.5 sec; 12/06/2019: 3 sec    Time  6    Period  Weeks    Status  On-going      PT LONG TERM GOAL #5   Title  Patient will have a worst hip pain of a 3/10 over the past week to indicate functional improvement with transferring and driving    Baseline  worst pain: 8/10; 10/26/2019: 6/10; 12/06/2019: 3/10    Time  6    Period  Weeks    Status  Achieved            Plan - 12/06/19 1258    Clinical Impression Statement  Patient is making progress towards long term goals, with improvement of FGA, gait speed, and total pain. Although patient is improving she continues to have increased difficulty with walking narrow base of support. Patient will benefit from further skilled therapy to return to prior level of function.  Personal Factors and Comorbidities  Age;Comorbidity 2    Comorbidities  hip fx, RA     Examination-Activity Limitations  Lift;Squat;Stand;Transfers    Examination-Participation Restrictions  Driving    Stability/Clinical Decision Making  Evolving/Moderate complexity    Rehab Potential  Good    PT Frequency  2x / week    PT Duration  6 weeks    PT Treatment/Interventions  Neuromuscular re-education;Stair training;Moist Heat;Cryotherapy;Iontophoresis 4mg /ml Dexamethasone;Electrical Stimulation;DME Instruction;Therapeutic activities;Therapeutic exercise;Balance training;Manual techniques;Patient/family education;Passive range of motion;Dry needling;Joint Manipulations    PT Next Visit Plan  progress hip strengthening, use of SPC    PT Home Exercise Plan  See education section    Consulted and Agree with Plan of Care  Patient       Patient will benefit from skilled therapeutic intervention in order to improve the following deficits and impairments:  Abnormal gait, Decreased activity tolerance, Decreased endurance, Decreased range of motion, Decreased strength, Decreased balance, Difficulty walking, Decreased cognition, Pain, Decreased coordination, Increased muscle spasms, Postural dysfunction, Hypomobility  Visit Diagnosis: Unsteadiness on feet  History of falling  Pain in right hip     Problem List Patient Active Problem List   Diagnosis Date Noted  . Closed right hip fracture ([place]) 09/20/2019  . Gout 05/18/2019  . Clavi 03/09/2019  . Right hip pain 07/08/2018  . Chronic bilateral low back pain without sciatica 12/31/2017  . Atrial fibrillation ([place]) 09/10/2017  . Trapezius strain, right, initial encounter 09/10/2017  . Depression, major, single episode, mild ([place]) 09/10/2017  . Chronic venous insufficiency 09/09/2017  . Lymphedema 09/09/2017  . Pain of toe of left foot 08/17/2017  . Leg swelling 07/31/2017  . Dry eyes 07/06/2017  . Pain and swelling of lower extremity, right 03/29/2017  . Right shoulder pain 03/29/2017  . Diarrhea 05/18/2016  . Fall  05/01/2016  . Leg weakness, bilateral 12/30/2015  . Macrocytic anemia 05/19/2013  . Psoriatic arthritis ([place]) 02/03/2013  . Right hand pain 08/05/2012  . Psoriasis   . Osteoarthritis, multiple sites   . History of breast cancer 10/28/2011  . GERD (gastroesophageal reflux disease)   . Hypertension   . Osteopenia   . Carotid stenosis 05/27/2011    Blythe Stanford, PT DPT 12/06/2019, 1:07 PM  [facility] [address], [place], [address] Phone: [phone]   Fax:  [phone]  Name: [name] MRN: [id number] Date of Birth: 01/07/1941

## 2019-12-11 ENCOUNTER — Other Ambulatory Visit: Payer: Self-pay

## 2019-12-11 ENCOUNTER — Ambulatory Visit: Payer: Medicare Other

## 2019-12-11 DIAGNOSIS — R2681 Unsteadiness on feet: Secondary | ICD-10-CM

## 2019-12-11 DIAGNOSIS — M25551 Pain in right hip: Secondary | ICD-10-CM | POA: Diagnosis not present

## 2019-12-11 DIAGNOSIS — Z9181 History of falling: Secondary | ICD-10-CM

## 2019-12-11 NOTE — Therapy (Signed)
[facility] [address] [place], [address] Phone: [phone]   Fax:  [phone]  Physical Therapy Treatment  Patient Details  Name: [name] MRN: [id number] Date of Birth: 20-Dec-1940 Referring Provider (PT): Tamala Julian   Encounter Date: 12/11/2019  PT End of Session - 12/11/19 1308    Visit Number  21    Number of Visits  21    Date for PT Re-Evaluation  12/14/19    Authorization Type  1/10 Medicare    PT Start Time  1300    PT Stop Time  1345    PT Time Calculation (min)  45 min    Activity Tolerance  Patient tolerated treatment well;No increased pain    Behavior During Therapy  WFL for tasks assessed/performed       Past Medical History:  Diagnosis Date  . (HFpEF) heart failure with preserved ejection fraction ([place])    a. 08/2017 Echo: EF 55-60%, no rwma, mild MR, mildly dil LA, nl RV fxn.  . Breast cancer ([place]) 2001   left breast  . Cancer ([place]) 2001   left breast ca  . Carotid arterial disease ([place])    a. 10/2004 s/p L CEA; b. 12/2015 Carotid U/S: RICA 1-39%; b. LICA patent CEA site.  Marland Kitchen GERD (gastroesophageal reflux disease)   . Hyperlipidemia   . Hypertension   . Osteoarthritis, multiple sites   . Osteopenia   . Persistent atrial fibrillation ([place])    a. Dx 08/2017; b. CHA2DS2VASc = 6-->Xarelto; c. 09/2017 Successful DCCV (second shock - 200J);   Marland Kitchen Personal history of radiation therapy 2001   left breast ca  . Psoriasis     Past Surgical History:  Procedure Laterality Date  . ABDOMINAL HYSTERECTOMY    . ANKLE FRACTURE SURGERY  4/08   left---hardware still in place  . 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 BREAST EXCISIONAL BIOPSY Left yrs ago   benign  . BREAST LUMPECTOMY Left 2001   f/u radiation  . CARDIOVERSION N/A 10/04/2017   Procedure: CARDIOVERSION;  Surgeon: Wellington Hampshire, MD;  Location: [facility] ORS;  Service: Cardiovascular;  Laterality: N/A;  .  CARDIOVERSION N/A 12/16/2017   Procedure: CARDIOVERSION;  Surgeon: [name], MD;  Location: [facility] ORS;  Service: Cardiovascular;  Laterality: N/A;  . CAROTID ENDARTERECTOMY Left 10/23/2004  . FRACTURE SURGERY    . OOPHORECTOMY    . SHOULDER SURGERY  6/07   left  . TONSILLECTOMY AND ADENOIDECTOMY    . TOTAL HIP ARTHROPLASTY  2004   right    There were no vitals filed for this visit.  Subjective Assessment - 12/11/19 1305    Subjective  Patient reports no major changes since the previous session. Patient states she would like to continue addressing LE strength limitations.    Pertinent History  Patient previously seen for balance difficulties, shoulder pain and the right, and low back pain.  Patient reports no falls in the past six months. Patient reports pain has not improved for the past month and has been about the same since the onset of pain.    Patient Stated Goals  To improve balance when walking, driving pain    Currently in Pain?  No/denies         TREATMENT  Therapeutic Exercise Sit to stands with use of TRX straps -3 x 10  Leg swings forward/backward B with UE support -x 2 min  Mini Lunges  in standing with UE support -x 20 SLS rotations in standing with UE support -- x 15 Heel raises performed quickly to utilize [place] -- x 20 Stepping over hurdles- x 36 hurdles one LE at one time Side stepping with YTB around knees -- 2 x 71ft; 2 x 67ft B SL hip extension with stance foot on airex, moving foot on furniture slider -- x 15 B  Performed exercises to improve LE and hipstrengthening    PT Education - 12/11/19 1308    Education Details  form/technique with exercise    Person(s) Educated  Patient    Methods  Explanation;Demonstration    Comprehension  Verbalized understanding;Returned demonstration       PT Short Term Goals - 10/26/19 1540      PT SHORT TERM GOAL #1   Title  Patient will have a worst pain score of 6/10 to better be able to drive with less  overall pain    Baseline  8/10; 10/26/2019: 6/10    Time  3    Period  Weeks    Status  On-going    Target Date  10/09/19        PT Long Term Goals - 12/06/19 1043      PT LONG TERM GOAL #1   Title  Patient will be independent with HEP to continue benefits of therapy after discharge.     Baseline  Dependent with form and technique for exercises; 10/26/2019: cueing needed to complete HEP; 12/06/2019:  performing exercises at home    Time  6    Period  Weeks    Status  On-going      PT LONG TERM GOAL #2   Title  Patient will improve 87mWT to over 1 m/s to indicate increased safety with ambulating in a community environment.    Baseline  .66 m/s; 10/26/2019: .75 m/s ; 12/06/2019: . 8 m/s    Time  6    Period  Weeks    Status  On-going      PT LONG TERM GOAL #3   Title  Patient will improve FGA to 63 to indicate significant functional improvement of R hip function.    Baseline  FGA: 44; 10/26/2019: FGA: 48; 5/192/2019: 53    Time  6    Period  Weeks    Status  On-going      PT LONG TERM GOAL #4   Title  Patient will be able to balance for 10sec with SLS to improve static balance and ability to get dressed when in standing    Baseline  2.5 sec B; 10/26/2019: 2.5 sec; 12/06/2019: 3 sec    Time  6    Period  Weeks    Status  On-going      PT LONG TERM GOAL #5   Title  Patient will have a worst hip pain of a 3/10 over the past week to indicate functional improvement with transferring and driving    Baseline  worst pain: 8/10; 10/26/2019: 6/10; 12/06/2019: 3/10    Time  6    Period  Weeks    Status  Achieved            Plan - 12/11/19 1309    Clinical Impression Statement  Continued to address LE strength during today's session and patient demonstrates improvement with ability to perform greater amount of sit to stands compared to previous sessions. Patient is improving overall, however continues to have difficulty with dynamic balance within narrow bases  of support. Patient will  benefit from further skilled therapy to return to prior level of function.    Personal Factors and Comorbidities  Age;Comorbidity 2    Comorbidities  hip fx, RA    Examination-Activity Limitations  Lift;Squat;Stand;Transfers    Examination-Participation Restrictions  Driving    Stability/Clinical Decision Making  Evolving/Moderate complexity    Rehab Potential  Good    PT Frequency  2x / week    PT Duration  6 weeks    PT Treatment/Interventions  Neuromuscular re-education;Stair training;Moist Heat;Cryotherapy;Iontophoresis 4mg /ml Dexamethasone;Electrical Stimulation;DME Instruction;Therapeutic activities;Therapeutic exercise;Balance training;Manual techniques;Patient/family education;Passive range of motion;Dry needling;Joint Manipulations    PT Next Visit Plan  progress hip strengthening, use of SPC    PT Home Exercise Plan  See education section    Consulted and Agree with Plan of Care  Patient       Patient will benefit from skilled therapeutic intervention in order to improve the following deficits and impairments:  Abnormal gait, Decreased activity tolerance, Decreased endurance, Decreased range of motion, Decreased strength, Decreased balance, Difficulty walking, Decreased cognition, Pain, Decreased coordination, Increased muscle spasms, Postural dysfunction, Hypomobility  Visit Diagnosis: Unsteadiness on feet  History of falling     Problem List Patient Active Problem List   Diagnosis Date Noted  . Closed right hip fracture ([place]) 09/20/2019  . Gout 05/18/2019  . Clavi 03/09/2019  . Right hip pain 07/08/2018  . Chronic bilateral low back pain without sciatica 12/31/2017  . Atrial fibrillation ([place]) 09/10/2017  . Trapezius strain, right, initial encounter 09/10/2017  . Depression, major, single episode, mild ([place]) 09/10/2017  . Chronic venous insufficiency 09/09/2017  . Lymphedema 09/09/2017  . Pain of toe of left foot 08/17/2017  . Leg swelling 07/31/2017  . Dry eyes  07/06/2017  . Pain and swelling of lower extremity, right 03/29/2017  . Right shoulder pain 03/29/2017  . Diarrhea 05/18/2016  . Fall 05/01/2016  . Leg weakness, bilateral 12/30/2015  . Macrocytic anemia 05/19/2013  . Psoriatic arthritis ([place]) 02/03/2013  . Right hand pain 08/05/2012  . Psoriasis   . Osteoarthritis, multiple sites   . History of breast cancer 10/28/2011  . GERD (gastroesophageal reflux disease)   . Hypertension   . Osteopenia   . Carotid stenosis 05/27/2011    Blythe Stanford, PT DPT 12/11/2019, 1:14 PM  [facility] [address], [place], [address] Phone: [phone]   Fax:  [phone]  Name: SERINA JAMESON MRN: [id number] Date of Birth: 1940/08/18

## 2019-12-13 ENCOUNTER — Ambulatory Visit: Payer: Medicare Other

## 2019-12-13 ENCOUNTER — Other Ambulatory Visit: Payer: Self-pay

## 2019-12-13 ENCOUNTER — Other Ambulatory Visit: Payer: Self-pay | Admitting: Family Medicine

## 2019-12-13 DIAGNOSIS — R2681 Unsteadiness on feet: Secondary | ICD-10-CM | POA: Diagnosis not present

## 2019-12-13 DIAGNOSIS — M25551 Pain in right hip: Secondary | ICD-10-CM | POA: Diagnosis not present

## 2019-12-13 DIAGNOSIS — Z9181 History of falling: Secondary | ICD-10-CM

## 2019-12-13 NOTE — Therapy (Signed)
[facility] [address] [place], [address] Phone: [phone]   Fax:  [phone]  Physical Therapy Treatment  Patient Details  Name: [name] MRN: [id number] Date of Birth: 1941-04-09 Referring Provider (PT): Tamala Julian   Encounter Date: 12/13/2019  PT End of Session - 12/13/19 1113    Visit Number  22    Number of Visits  23    Date for PT Re-Evaluation  12/14/19    Authorization Type  2/10 Medicare    PT Start Time  1030    PT Stop Time  1115    PT Time Calculation (min)  45 min    Activity Tolerance  Patient tolerated treatment well;No increased pain    Behavior During Therapy  WFL for tasks assessed/performed       Past Medical History:  Diagnosis Date  . (HFpEF) heart failure with preserved ejection fraction ([place])    a. 08/2017 Echo: EF 55-60%, no rwma, mild MR, mildly dil LA, nl RV fxn.  . Breast cancer ([place]) 2001   left breast  . Cancer ([place]) 2001   left breast ca  . Carotid arterial disease ([place])    a. 10/2004 s/p L CEA; b. 12/2015 Carotid U/S: RICA 1-39%; b. LICA patent CEA site.  Marland Kitchen GERD (gastroesophageal reflux disease)   . Hyperlipidemia   . Hypertension   . Osteoarthritis, multiple sites   . Osteopenia   . Persistent atrial fibrillation ([place])    a. Dx 08/2017; b. CHA2DS2VASc = 6-->Xarelto; c. 09/2017 Successful DCCV (second shock - 200J);   Marland Kitchen Personal history of radiation therapy 2001   left breast ca  . Psoriasis     Past Surgical History:  Procedure Laterality Date  . ABDOMINAL HYSTERECTOMY    . ANKLE FRACTURE SURGERY  4/08   left---hardware still in place  . 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 BREAST EXCISIONAL BIOPSY Left yrs ago   benign  . BREAST LUMPECTOMY Left 2001   f/u radiation  . CARDIOVERSION N/A 10/04/2017   Procedure: CARDIOVERSION;  Surgeon: Wellington Hampshire, MD;  Location: [facility];  Service: Cardiovascular;  Laterality: N/A;  .  CARDIOVERSION N/A 12/16/2017   Procedure: CARDIOVERSION;  Surgeon: [name], MD;  Location: [facility] ORS;  Service: Cardiovascular;  Laterality: N/A;  . CAROTID ENDARTERECTOMY Left 10/23/2004  . FRACTURE SURGERY    . OOPHORECTOMY    . SHOULDER SURGERY  6/07   left  . TONSILLECTOMY AND ADENOIDECTOMY    . TOTAL HIP ARTHROPLASTY  2004   right    There were no vitals filed for this visit.  Subjective Assessment - 12/13/19 1036    Subjective  Patient states she has been working to cleaning her linen closet. Patient reports she is improving overall.    Pertinent History  Patient previously seen for balance difficulties, shoulder pain and the right, and low back pain.  Patient reports no falls in the past six months. Patient reports pain has not improved for the past month and has been about the same since the onset of pain.    Patient Stated Goals  To improve balance when walking, driving pain    Currently in Pain?  No/denies             TREATMENT  Therapeutic Exercise Sit to stands with use of TRX straps - 2 x 20  Leg swings forward/backward B with UE support - x 2 min  Stepping over hurdles - x 36 hurdles one LE at one time Side stepping up and over airex pad - x 8 B Mini Lunges in standing with UE support - x 20 SLS rotations in standing with UE support -- x 15 Side stepping up and over hurdles - 3 x 6 hurdles B Side stepping with YTB around knees -- 2 x 93ft; 2 x 80ft B Heel raises performed quickly to utilize [place] -- x 20   Performed exercises to improve LE and hip strengthening   PT Education - 12/13/19 1058    Education Details  form/technique with exercise    Person(s) Educated  Patient    Methods  Explanation;Demonstration    Comprehension  Returned demonstration;Verbalized understanding       PT Short Term Goals - 10/26/19 1540      PT [place] #1   Title  Patient will have a worst pain score of 6/10 to better be able to drive with less overall pain     Baseline  8/10; 10/26/2019: 6/10    Time  3    Period  Weeks    Status  On-going    Target Date  10/09/19        PT Long Term Goals - 12/06/19 1043      PT LONG TERM GOAL #1   Title  Patient will be independent with HEP to continue benefits of therapy after discharge.     Baseline  Dependent with form and technique for exercises; 10/26/2019: cueing needed to complete HEP; 12/06/2019:  performing exercises at home    Time  6    Period  Weeks    Status  On-going      PT LONG TERM GOAL #2   Title  Patient will improve 20mWT to over 1 m/s to indicate increased safety with ambulating in a community environment.    Baseline  .66 m/s; 10/26/2019: .75 m/s ; 12/06/2019: . 8 m/s    Time  6    Period  Weeks    Status  On-going      PT LONG TERM GOAL #3   Title  Patient will improve FGA to 63 to indicate significant functional improvement of R hip function.    Baseline  FGA: 44; 10/26/2019: FGA: 48; 5/192/2019: 53    Time  6    Period  Weeks    Status  On-going      PT LONG TERM GOAL #4   Title  Patient will be able to balance for 10sec with SLS to improve static balance and ability to get dressed when in standing    Baseline  2.5 sec B; 10/26/2019: 2.5 sec; 12/06/2019: 3 sec    Time  6    Period  Weeks    Status  On-going      PT LONG TERM GOAL #5   Title  Patient will have a worst hip pain of a 3/10 over the past week to indicate functional improvement with transferring and driving    Baseline  worst pain: 8/10; 10/26/2019: 6/10; 12/06/2019: 3/10    Time  6    Period  Weeks    Status  Achieved            Plan - 12/13/19 1114    Clinical Impression Statement  Patient demonstrates improvement with side stepping motions today with less episodes of LOB requiring stepping out to correct. Patient continues to have increased fatigue with prolonged ambulation and standing based  exercises, however this is improving overall. Continues with decreased confidence with performing stepping motions  without UE support, will continue to address this in futre sessions. Patient will benefit from further skilled therapy to return to prior level of function.    Personal Factors and Comorbidities  Age;Comorbidity 2    Comorbidities  hip fx, RA    Examination-Activity Limitations  Lift;Squat;Stand;Transfers    Examination-Participation Restrictions  Driving    Stability/Clinical Decision Making  Evolving/Moderate complexity    Rehab Potential  Good    PT Frequency  2x / week    PT Duration  6 weeks    PT Treatment/Interventions  Neuromuscular re-education;Stair training;Moist Heat;Cryotherapy;Iontophoresis 4mg /ml Dexamethasone;Electrical Stimulation;DME Instruction;Therapeutic activities;Therapeutic exercise;Balance training;Manual techniques;Patient/family education;Passive range of motion;Dry needling;Joint Manipulations    PT Next Visit Plan  progress hip strengthening, use of SPC    PT Home Exercise Plan  See education section    Consulted and Agree with Plan of Care  Patient       Patient will benefit from skilled therapeutic intervention in order to improve the following deficits and impairments:  Abnormal gait, Decreased activity tolerance, Decreased endurance, Decreased range of motion, Decreased strength, Decreased balance, Difficulty walking, Decreased cognition, Pain, Decreased coordination, Increased muscle spasms, Postural dysfunction, Hypomobility  Visit Diagnosis: Unsteadiness on feet  History of falling  Pain in right hip     Problem List Patient Active Problem List   Diagnosis Date Noted  . Closed right hip fracture ([place]) 09/20/2019  . Gout 05/18/2019  . Clavi 03/09/2019  . Right hip pain 07/08/2018  . Chronic bilateral low back pain without sciatica 12/31/2017  . Atrial fibrillation ([place]) 09/10/2017  . Trapezius strain, right, initial encounter 09/10/2017  . Depression, major, single episode, mild ([place]) 09/10/2017  . Chronic venous insufficiency 09/09/2017  .  Lymphedema 09/09/2017  . Pain of toe of left foot 08/17/2017  . Leg swelling 07/31/2017  . Dry eyes 07/06/2017  . Pain and swelling of lower extremity, right 03/29/2017  . Right shoulder pain 03/29/2017  . Diarrhea 05/18/2016  . Fall 05/01/2016  . Leg weakness, bilateral 12/30/2015  . Macrocytic anemia 05/19/2013  . Psoriatic arthritis ([place]) 02/03/2013  . Right hand pain 08/05/2012  . Psoriasis   . Osteoarthritis, multiple sites   . History of breast cancer 10/28/2011  . GERD (gastroesophageal reflux disease)   . Hypertension   . Osteopenia   . Carotid stenosis 05/27/2011    Blythe Stanford, PT DPT 12/13/2019, 11:29 AM  [facility] [address] [place], [address] Phone: [phone]   Fax:  [phone]  Name: [name] MRN: [id number] Date of Birth: 1941-06-29

## 2019-12-14 ENCOUNTER — Ambulatory Visit (INDEPENDENT_AMBULATORY_CARE_PROVIDER_SITE_OTHER): Payer: Medicare Other | Admitting: Internal Medicine

## 2019-12-14 ENCOUNTER — Encounter: Payer: Self-pay | Admitting: Internal Medicine

## 2019-12-14 VITALS — BP 170/100 | HR 67 | Ht 66.0 in | Wt 118.1 lb

## 2019-12-14 DIAGNOSIS — I4819 Other persistent atrial fibrillation: Secondary | ICD-10-CM

## 2019-12-14 DIAGNOSIS — I6523 Occlusion and stenosis of bilateral carotid arteries: Secondary | ICD-10-CM

## 2019-12-14 DIAGNOSIS — Z79899 Other long term (current) drug therapy: Secondary | ICD-10-CM | POA: Diagnosis not present

## 2019-12-14 DIAGNOSIS — R001 Bradycardia, unspecified: Secondary | ICD-10-CM | POA: Diagnosis not present

## 2019-12-14 NOTE — Patient Instructions (Signed)
Medication Instructions:  - Your physician recommends that you continue on your current medications as directed. Please refer to the Current Medication list given to you today.  *If you need a refill on your cardiac medications before your next appointment, please call your pharmacy*   Lab Work: -  Your physician recommends that you have lab work today: CBC  If you have labs (blood work) drawn today and your tests are completely normal, you will receive your results only by: Marland Kitchen MyChart Message (if you have MyChart) OR . A paper copy in the mail If you have any lab test that is abnormal or we need to change your treatment, we will call you to review the results.   Testing/Procedures: - none ordered   Follow-Up: At [facility], you and your health needs are our priority.  As part of our continuing mission to provide you with exceptional heart care, we have created designated Provider Care Teams.  These Care Teams include your primary Cardiologist (physician) and Advanced Practice Providers (APPs -  Physician Assistants and Nurse Practitioners) who all work together to provide you with the care you need, when you need it.  We recommend signing up for the patient portal called "MyChart".  Sign up information is provided on this After Visit Summary.  MyChart is used to connect with patients for Virtual Visits (Telemedicine).  Patients are able to view lab/test results, encounter notes, upcoming appointments, etc.  Non-urgent messages can be sent to your provider as well.   To learn more about what you can do with MyChart, go to [email or web address].    Your next appointment:   6 month(s)  The format for your next appointment:   In Person  Provider:   Kate Sable, MD   Other Instructions n/a

## 2019-12-14 NOTE — Progress Notes (Signed)
Patient Care Team: Dana Haven, MD as PCP - General (Family Medicine) Dana Sprang, MD as PCP - Cardiology (Cardiology)   HPI  [name] is a 79 y.o. female Seen in follow-up for recent hospitalization having presented with acute shortness of breath and found to be in rapid atrial fibrillation.  Rate control was accomplished with beta-blockers and calcium blockers and anticoagulation with Xarelto; now on BB alone  She has had recurrent atrial fib with RVR  UnderwentDCCV with rapid reversion  Started flecainide 5/19  No interval Afib of which she is aware; no clinical bleeding  Has developed debilitating hand arthritis, being treated now with allopurinol, unable to hold a paint brush  No edema chest Harris or sob   DATE PR interval QRSduration Dose-Flec  5/19  NA 78 0  6/19 210 84 50  9/19 210 90 50  10/20 216 92 50  5/21 226 94 50  \]     DATE TEST EF   2/19 Echo   65 % LAE (48/2.8/46)         Date Cr K Hgb  2/19 0.8 3.9 12.5  5/19  1.12 3.8   6/20 0.97 4.4 12.4  12/20 0.9  (CE) 2/21 4.2  [id number]    Thromboembolic risk factors ( age  -2, HTN-1, Vasc disease -1, Gender-1) for a CHADSVASc Score of 5         She is [name]' s wife.  Son, [name], is an unreturned prodigal.  She is expecting a great grandchild   Past Surgical History:  Procedure Laterality Date  . ABDOMINAL HYSTERECTOMY    . ANKLE FRACTURE SURGERY  4/08   left---hardware still in place  . 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 BREAST EXCISIONAL BIOPSY Left yrs ago   benign  . BREAST LUMPECTOMY Left 2001   f/u radiation  . CARDIOVERSION N/A 10/04/2017   Procedure: CARDIOVERSION;  Surgeon: Dana Hampshire, MD;  Location: [facility] OR[facility];  Service: Cardiovascular;  Laterality: N/A;  . CARDIOVERSION N/A 12/16/2017   Procedure: CARDIOVERSION;  Surgeon: Dana Sprang, MD;  Location: [facility];  Service: Cardiovascular;  Laterality: N/A;  . CAROTID ENDARTERECTOMY Left 10/23/2004    . FRACTURE SURGERY    . OOPHORECTOMY    . SHOULDER SURGERY  6/07   left  . TONSILLECTOMY AND ADENOIDECTOMY    . TOTAL HIP ARTHROPLASTY  2004   right    Current Outpatient Medications  Medication Sig Dispense Refill  . allopurinol (ZYLOPRIM) 100 MG tablet Take 100 mg by mouth daily.    . Cholecalciferol (VITAMIN D3) 1000 units CAPS Take 1 capsule by mouth 2 (two) times daily.     . dabigatran (PRADAXA) 150 MG CAPS capsule Take 1 capsule (150 mg total) by mouth 2 (two) times daily. 180 capsule 3  . flecainide (TAMBOCOR) 50 MG tablet TAKE 1 TABLET BY MOUTH TWICE A DAY 180 tablet 1  . furosemide (LASIX) 40 MG tablet TAKE 1 TABLET BY MOUTH EVERY DAY 90 tablet 1  . LORazepam (ATIVAN) 0.5 MG tablet TAKE 1 TABLET BY MOUTH TWICE A DAY AS NEEDED FOR ANXIETY 60 tablet 0  . metoprolol tartrate (LOPRESSOR) 50 MG tablet Take 1/2 tablet (25 mg) by mouth twice daily 90 tablet 1  . Multiple Vitamin (MULTIVITAMIN) capsule Take 1 capsule by mouth daily.      . Multiple Vitamins-Minerals (PRESERVISION AREDS 2) CAPS Take 1 tablet by mouth 2 (two)  times daily.     No current facility-administered medications for this visit.    No Known Allergies    Review of Systems negative except from HPI and PMH  Physical Exam BP (!) 170/100 (BP Location: Left Arm, Patient Position: Sitting, Cuff Size: Normal)   Pulse 67   Ht 5\' 6"  (1.676 m)   Wt 118 lb 2 oz (53.6 kg)   SpO2 99%   BMI 19.07 kg/m  Well developed and nourished in no acute distress HENT normal Neck supple with JVP  Clear Regular rate and rhythm, no murmurs or gallops Abd-soft with active BS No Clubbing cyanosis edema Skin-warm and dry venous discoloration  A & Oriented  Grossly normal sensory and motor function Hand-digital swelling and warmth  ECG sinus @ 67 23/09/44  Assessment and  Plan  Atrial fibrillation-persistent with a rapid rate  Sinus brady  Grief  Anemia   Hypertension   Arthritis  Peripheral vascular disease  carotid artery occlusion  No interval palpitations  Tolerating flecainide  On Anticoagulation;  No bleeding issues but Hgb ws down in December-will recheck  BP is elevated and has been repeatedly, but also 125 3/21 so have asked her to check at home-- suspect she will need more meds  She is not on a statin, the notes from vasc surgery suggest she is; her LDL is low, but notwithstanding, it is indicated--she has declined repeatedly in the past  Her arthritis is ( obviously) beyond my ken, on the other hand her hands are swollen and tender and I would have thought antiinflammatory therapy of some kind would be indicated  I have given her [name] name and would be glad to make the referral if necessary     No interval palpitations.  Tolerating flecainide.  On Anticoagulation;  No bleeding issues   Husband has been gone now a year and a half.  Today his his birthday.  She is reading again and painting again.

## 2019-12-15 LAB — CBC WITH DIFFERENTIAL/PLATELET
Basophils Absolute: 0 10*3/uL (ref 0.0–0.2)
Basos: 1 %
EOS (ABSOLUTE): 0.2 10*3/uL (ref 0.0–0.4)
Eos: 3 %
Hematocrit: 36.1 % (ref 34.0–46.6)
Hemoglobin: 11.9 g/dL (ref 11.1–15.9)
Immature Grans (Abs): 0 10*3/uL (ref 0.0–0.1)
Immature Granulocytes: 0 %
Lymphocytes Absolute: 1.8 10*3/uL (ref 0.7–3.1)
Lymphs: 30 %
MCH: 35.2 pg — ABNORMAL HIGH (ref 26.6–33.0)
MCHC: 33 g/dL (ref 31.5–35.7)
MCV: 107 fL — ABNORMAL HIGH (ref 79–97)
Monocytes Absolute: 0.7 10*3/uL (ref 0.1–0.9)
Monocytes: 12 %
Neutrophils Absolute: 3.3 10*3/uL (ref 1.4–7.0)
Neutrophils: 54 %
Platelets: 272 10*3/uL (ref 150–450)
RBC: 3.38 x10E6/uL — ABNORMAL LOW (ref 3.77–5.28)
RDW: 13.6 % (ref 11.7–15.4)
WBC: 6 10*3/uL (ref 3.4–10.8)

## 2019-12-19 ENCOUNTER — Ambulatory Visit: Payer: Medicare Other | Attending: Orthopedic Surgery

## 2019-12-19 ENCOUNTER — Other Ambulatory Visit: Payer: Self-pay

## 2019-12-19 DIAGNOSIS — M6281 Muscle weakness (generalized): Secondary | ICD-10-CM | POA: Insufficient documentation

## 2019-12-19 DIAGNOSIS — M25551 Pain in right hip: Secondary | ICD-10-CM | POA: Diagnosis not present

## 2019-12-19 DIAGNOSIS — M25511 Pain in right shoulder: Secondary | ICD-10-CM | POA: Insufficient documentation

## 2019-12-19 DIAGNOSIS — M545 Low back pain: Secondary | ICD-10-CM | POA: Insufficient documentation

## 2019-12-19 DIAGNOSIS — R2681 Unsteadiness on feet: Secondary | ICD-10-CM | POA: Insufficient documentation

## 2019-12-19 DIAGNOSIS — Z9181 History of falling: Secondary | ICD-10-CM | POA: Diagnosis not present

## 2019-12-19 NOTE — Therapy (Addendum)
[facility] [address] [place], [address] Phone: [phone]   Fax:  [phone]  Physical Therapy Treatment/Progress Note  Patient Details  Name: [name] MRN: [id number] Date of Birth: Dec 20, 1940 Referring Provider (PT): Tamala Julian  Reporting Period: 10/26/2019 - 12/19/2019  Encounter Date: 12/19/2019  PT End of Session - 12/19/19 0909    Visit Number  23    Number of Visits  23    Date for PT Re-Evaluation  12/14/19    Authorization Type  2/10 Medicare    PT Start Time  0900    PT Stop Time  0945    PT Time Calculation (min)  45 min    Activity Tolerance  Patient tolerated treatment well;No increased pain    Behavior During Therapy  WFL for tasks assessed/performed       Past Medical History:  Diagnosis Date  . (HFpEF) heart failure with preserved ejection fraction ([place])    a. 08/2017 Echo: EF 55-60%, no rwma, mild MR, mildly dil LA, nl RV fxn.  . Breast cancer ([place]) 2001   left breast  . Cancer ([place]) 2001   left breast ca  . Carotid arterial disease ([place])    a. 10/2004 s/p L CEA; b. 12/2015 Carotid U/S: RICA 1-39%; b. LICA patent CEA site.  Marland Kitchen GERD (gastroesophageal reflux disease)   . Hyperlipidemia   . Hypertension   . Osteoarthritis, multiple sites   . Osteopenia   . Persistent atrial fibrillation ([place])    a. Dx 08/2017; b. CHA2DS2VASc = 6-->Xarelto; c. 09/2017 Successful DCCV (second shock - 200J);   Marland Kitchen Personal history of radiation therapy 2001   left breast ca  . Psoriasis     Past Surgical History:  Procedure Laterality Date  . ABDOMINAL HYSTERECTOMY    . ANKLE FRACTURE SURGERY  4/08   left---hardware still in place  . 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 BREAST EXCISIONAL BIOPSY Left yrs ago   benign  . BREAST LUMPECTOMY Left 2001   f/u radiation  . CARDIOVERSION N/A 10/04/2017   Procedure: CARDIOVERSION;  Surgeon: Wellington Hampshire, MD;  Location: [facility] ORS;   Service: Cardiovascular;  Laterality: N/A;  . CARDIOVERSION N/A 12/16/2017   Procedure: CARDIOVERSION;  Surgeon: [name], MD;  Location: [facility] ORS;  Service: Cardiovascular;  Laterality: N/A;  . CAROTID ENDARTERECTOMY Left 10/23/2004  . FRACTURE SURGERY    . OOPHORECTOMY    . SHOULDER SURGERY  6/07   left  . TONSILLECTOMY AND ADENOIDECTOMY    . TOTAL HIP ARTHROPLASTY  2004   right    There were no vitals filed for this visit.  Subjective Assessment - 12/19/19 0906    Subjective  Patient reports no major changes since the previous session. Patient states she would like to continue to address weakness and balance concerns.    Pertinent History  Patient previously seen for balance difficulties, shoulder pain and the right, and low back pain.  Patient reports no falls in the past six months. Patient reports pain has not improved for the past month and has been about the same since the onset of pain.    Patient Stated Goals  To improve balance when walking, driving pain    Currently in Pain?  No/denies         TREATMENT  Therapeutic Exercise Sit to stands with use of TRX straps - 2 x 20  Mini Lunges in standing  with UE support - 2 x 15 Leg swings forward/backward B with UE support - x 2 min  Side stepping up and over airex beam - x 8 B down and back Hip circles with use of furniture slider in standing - x 15 B Side stepping with YTB around ankles -- 2 x 72ft; 2 x 12ft B Balance stones - 2 x 7  Hip circles with foot in air and YTB around knees cw/ccw 2 x 10  Feet together balance on airex pad - x 3 min Performed exercises to improve LE and hip strengthening     PT Education - 12/19/19 0908    Education Details  form/technique with exercise    Person(s) Educated  Patient    Methods  Explanation;Demonstration    Comprehension  Verbalized understanding;Returned demonstration       PT Short Term Goals - 10/26/19 1540      PT SHORT TERM GOAL #1   Title  Patient will have a  worst pain score of 6/10 to better be able to drive with less overall pain    Baseline  8/10; 10/26/2019: 6/10    Time  3    Period  Weeks    Status  On-going    Target Date  10/09/19        PT Long Term Goals - 12/06/19 1043      PT LONG TERM GOAL #1   Title  Patient will be independent with HEP to continue benefits of therapy after discharge.     Baseline  Dependent with form and technique for exercises; 10/26/2019: cueing needed to complete HEP; 12/06/2019:  performing exercises at home    Time  6    Period  Weeks    Status  On-going      PT LONG TERM GOAL #2   Title  Patient will improve 35mWT to over 1 m/s to indicate increased safety with ambulating in a community environment.    Baseline  .66 m/s; 10/26/2019: .75 m/s ; 12/06/2019: . 8 m/s    Time  6    Period  Weeks    Status  On-going      PT LONG TERM GOAL #3   Title  Patient will improve FGA to 63 to indicate significant functional improvement of R hip function.    Baseline  FGA: 44; 10/26/2019: FGA: 48; 5/192/2019: 53    Time  6    Period  Weeks    Status  On-going      PT LONG TERM GOAL #4   Title  Patient will be able to balance for 10sec with SLS to improve static balance and ability to get dressed when in standing    Baseline  2.5 sec B; 10/26/2019: 2.5 sec; 12/06/2019: 3 sec    Time  6    Period  Weeks    Status  On-going      PT LONG TERM GOAL #5   Title  Patient will have a worst hip pain of a 3/10 over the past week to indicate functional improvement with transferring and driving    Baseline  worst pain: 8/10; 10/26/2019: 6/10; 12/06/2019: 3/10    Time  6    Period  Weeks    Status  Achieved            Plan - 12/19/19 0925    Clinical Impression Statement  Conitnued to focus on improving hip strengthening and balancing with narrow base of support. Patient is making  progress towards long term goals with overall improvement in balance measurements and ability to perform greater amount of activities without  LOB. Patient continues to demonstate increased difficulty and weakness with performing lateral hip movements indicating increased weakness along the glute med. Patient will benefit from further skilled therapy to return to prior level of function.    Personal Factors and Comorbidities  Age;Comorbidity 2    Comorbidities  hip fx, RA    Examination-Activity Limitations  Lift;Squat;Stand;Transfers    Examination-Participation Restrictions  Driving    Stability/Clinical Decision Making  Evolving/Moderate complexity    Rehab Potential  Good    PT Frequency  2x / week    PT Duration  6 weeks    PT Treatment/Interventions  Neuromuscular re-education;Stair training;Moist Heat;Cryotherapy;Iontophoresis 4mg /ml Dexamethasone;Electrical Stimulation;DME Instruction;Therapeutic activities;Therapeutic exercise;Balance training;Manual techniques;Patient/family education;Passive range of motion;Dry needling;Joint Manipulations    PT Next Visit Plan  progress hip strengthening, use of SPC    PT Home Exercise Plan  See education section    Consulted and Agree with Plan of Care  Patient       Patient will benefit from skilled therapeutic intervention in order to improve the following deficits and impairments:  Abnormal gait, Decreased activity tolerance, Decreased endurance, Decreased range of motion, Decreased strength, Decreased balance, Difficulty walking, Decreased cognition, Pain, Decreased coordination, Increased muscle spasms, Postural dysfunction, Hypomobility  Visit Diagnosis: Unsteadiness on feet  History of falling  Pain in right hip     Problem List Patient Active Problem List   Diagnosis Date Noted  . Closed right hip fracture ([place]) 09/20/2019  . Gout 05/18/2019  . Clavi 03/09/2019  . Right hip pain 07/08/2018  . Chronic bilateral low back pain without sciatica 12/31/2017  . Atrial fibrillation ([place]) 09/10/2017  . Trapezius strain, right, initial encounter 09/10/2017  . Depression,  major, single episode, mild ([place]) 09/10/2017  . Chronic venous insufficiency 09/09/2017  . Lymphedema 09/09/2017  . Pain of toe of left foot 08/17/2017  . Leg swelling 07/31/2017  . Dry eyes 07/06/2017  . Pain and swelling of lower extremity, right 03/29/2017  . Right shoulder pain 03/29/2017  . Diarrhea 05/18/2016  . Fall 05/01/2016  . Leg weakness, bilateral 12/30/2015  . Macrocytic anemia 05/19/2013  . Psoriatic arthritis ([place]) 02/03/2013  . Right hand pain 08/05/2012  . Psoriasis   . Osteoarthritis, multiple sites   . History of breast cancer 10/28/2011  . GERD (gastroesophageal reflux disease)   . Hypertension   . Osteopenia   . Carotid stenosis 05/27/2011    Blythe Stanford, PT DPT 12/19/2019, 9:44 AM  [facility] [address] [place], [address] Phone: [phone]   Fax:  [phone]  Name: [name] MRN: [id number] Date of Birth: January 31, 1941

## 2019-12-21 ENCOUNTER — Other Ambulatory Visit: Payer: Self-pay

## 2019-12-21 ENCOUNTER — Ambulatory Visit: Payer: Medicare Other

## 2019-12-21 DIAGNOSIS — M545 Low back pain: Secondary | ICD-10-CM | POA: Diagnosis not present

## 2019-12-21 DIAGNOSIS — Z9181 History of falling: Secondary | ICD-10-CM

## 2019-12-21 DIAGNOSIS — M6281 Muscle weakness (generalized): Secondary | ICD-10-CM | POA: Diagnosis not present

## 2019-12-21 DIAGNOSIS — R2681 Unsteadiness on feet: Secondary | ICD-10-CM | POA: Diagnosis not present

## 2019-12-21 DIAGNOSIS — M25551 Pain in right hip: Secondary | ICD-10-CM | POA: Diagnosis not present

## 2019-12-21 DIAGNOSIS — M25511 Pain in right shoulder: Secondary | ICD-10-CM | POA: Diagnosis not present

## 2019-12-21 NOTE — Addendum Note (Signed)
Addended by: Blain Pais on: 12/21/2019 09:15 AM   Modules accepted: Orders

## 2019-12-21 NOTE — Therapy (Signed)
[facility] [address], [place], [address] Phone: [phone]   Fax:  [phone]  Physical Therapy Treatment  Patient Details  Name: [name] MRN: [id number] Date of Birth: 07/26/1940 Referring Provider (PT): Tamala Julian   Encounter Date: 12/21/2019  PT End of Session - 12/21/19 0921    Visit Number  24    Number of Visits  31    Date for PT Re-Evaluation  01/16/20    Authorization Type  3/10 Medicare    PT Start Time  0900    PT Stop Time  0945    PT Time Calculation (min)  45 min    Activity Tolerance  Patient tolerated treatment well;No increased pain    Behavior During Therapy  WFL for tasks assessed/performed       Past Medical History:  Diagnosis Date  . (HFpEF) heart failure with preserved ejection fraction ([place])    a. 08/2017 Echo: EF 55-60%, no rwma, mild MR, mildly dil LA, nl RV fxn.  . Breast cancer ([place]) 2001   left breast  . Cancer ([place]) 2001   left breast ca  . Carotid arterial disease ([place])    a. 10/2004 s/p L CEA; b. 12/2015 Carotid U/S: RICA 1-39%; b. LICA patent CEA site.  Marland Kitchen GERD (gastroesophageal reflux disease)   . Hyperlipidemia   . Hypertension   . Osteoarthritis, multiple sites   . Osteopenia   . Persistent atrial fibrillation ([place])    a. Dx 08/2017; b. CHA2DS2VASc = 6-->Xarelto; c. 09/2017 Successful DCCV (second shock - 200J);   Marland Kitchen Personal history of radiation therapy 2001   left breast ca  . Psoriasis     Past Surgical History:  Procedure Laterality Date  . ABDOMINAL HYSTERECTOMY    . ANKLE FRACTURE SURGERY  4/08   left---hardware still in place  . 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 BREAST EXCISIONAL BIOPSY Left yrs ago   benign  . BREAST LUMPECTOMY Left 2001   f/u radiation  . CARDIOVERSION N/A 10/04/2017   Procedure: CARDIOVERSION;  Surgeon: Wellington Hampshire, MD;  Location: [facility] ORS;  Service: Cardiovascular;  Laterality: N/A;  . CARDIOVERSION  N/A 12/16/2017   Procedure: CARDIOVERSION;  Surgeon: [name], MD;  Location: [facility] ORS;  Service: Cardiovascular;  Laterality: N/A;  . CAROTID ENDARTERECTOMY Left 10/23/2004  . FRACTURE SURGERY    . OOPHORECTOMY    . SHOULDER SURGERY  6/07   left  . TONSILLECTOMY AND ADENOIDECTOMY    . TOTAL HIP ARTHROPLASTY  2004   right    There were no vitals filed for this visit.  Subjective Assessment - 12/21/19 0907    Subjective  Patient report no major changes since the previous session.    Pertinent History  Patient previously seen for balance difficulties, shoulder pain and the right, and low back pain.  Patient reports no falls in the past six months. Patient reports pain has not improved for the past month and has been about the same since the onset of pain.    Patient Stated Goals  To improve balance when walking, driving pain    Currently in Pain?  No/denies         TREATMENT  Therapeutic Exercise Sit to stands with use of TRX straps - 2 x 20  Mini Lunges in standing with UE support - 2 x 15 Leg swings forward/backward B with UE support - x 2 min  Side stepping with YTB around ankles -- 2 x 77ft; 2 x 25ft B Hip circles with foot in air and YTB around knees cw/ccw 2 x 10 Hip circles with use of furniture slider in standing - x 15 B Running man with furniture slider - x 10 B  Feet semi tandem  balance on airex pad - x 3 min  Performed exercises to improve LE and hip strengthening       PT Education - 12/21/19 0908    Education Details  form/technique with exercise    Person(s) Educated  Patient    Methods  Explanation;Demonstration    Comprehension  Verbalized understanding;Returned demonstration       PT Short Term Goals - 10/26/19 1540      PT SHORT TERM GOAL #1   Title  Patient will have a worst pain score of 6/10 to better be able to drive with less overall pain    Baseline  8/10; 10/26/2019: 6/10    Time  3    Period  Weeks    Status  On-going    Target  Date  10/09/19        PT Long Term Goals - 12/06/19 1043      PT LONG TERM GOAL #1   Title  Patient will be independent with HEP to continue benefits of therapy after discharge.     Baseline  Dependent with form and technique for exercises; 10/26/2019: cueing needed to complete HEP; 12/06/2019:  performing exercises at home    Time  6    Period  Weeks    Status  On-going      PT LONG TERM GOAL #2   Title  Patient will improve 2mWT to over 1 m/s to indicate increased safety with ambulating in a community environment.    Baseline  .66 m/s; 10/26/2019: .75 m/s ; 12/06/2019: . 8 m/s    Time  6    Period  Weeks    Status  On-going      PT LONG TERM GOAL #3   Title  Patient will improve FGA to 63 to indicate significant functional improvement of R hip function.    Baseline  FGA: 44; 10/26/2019: FGA: 48; 5/192/2019: 53    Time  6    Period  Weeks    Status  On-going      PT LONG TERM GOAL #4   Title  Patient will be able to balance for 10sec with SLS to improve static balance and ability to get dressed when in standing    Baseline  2.5 sec B; 10/26/2019: 2.5 sec; 12/06/2019: 3 sec    Time  6    Period  Weeks    Status  On-going      PT LONG TERM GOAL #5   Title  Patient will have a worst hip pain of a 3/10 over the past week to indicate functional improvement with transferring and driving    Baseline  worst pain: 8/10; 10/26/2019: 6/10; 12/06/2019: 3/10    Time  6    Period  Weeks    Status  Achieved            Plan - 12/21/19 [id number]    Clinical Impression Statement  Continued to focus on improving hip strengthening and improving SLS. Patient able to balance in single leg stance for a greater period of time during today's session ~ 3 sec compared to previous sessions. Although patient is improving, she continues to have difficulties  with performing dynamic balance activities most notably the stairs. Patient will benefit from further skilled therapy to return to prior level of function.     Personal Factors and Comorbidities  Age;Comorbidity 2    Comorbidities  hip fx, RA    Examination-Activity Limitations  Lift;Squat;Stand;Transfers    Examination-Participation Restrictions  Driving    Stability/Clinical Decision Making  Evolving/Moderate complexity    Rehab Potential  Good    PT Frequency  2x / week    PT Duration  6 weeks    PT Treatment/Interventions  Neuromuscular re-education;Stair training;Moist Heat;Cryotherapy;Iontophoresis 4mg /ml Dexamethasone;Electrical Stimulation;DME Instruction;Therapeutic activities;Therapeutic exercise;Balance training;Manual techniques;Patient/family education;Passive range of motion;Dry needling;Joint Manipulations    PT Next Visit Plan  progress hip strengthening, use of SPC    PT Home Exercise Plan  See education section    Consulted and Agree with Plan of Care  Patient       Patient will benefit from skilled therapeutic intervention in order to improve the following deficits and impairments:  Abnormal gait, Decreased activity tolerance, Decreased endurance, Decreased range of motion, Decreased strength, Decreased balance, Difficulty walking, Decreased cognition, Pain, Decreased coordination, Increased muscle spasms, Postural dysfunction, Hypomobility  Visit Diagnosis: Unsteadiness on feet  History of falling  Pain in right hip     Problem List Patient Active Problem List   Diagnosis Date Noted  . Closed right hip fracture ([place]) 09/20/2019  . Gout 05/18/2019  . Clavi 03/09/2019  . Right hip pain 07/08/2018  . Chronic bilateral low back pain without sciatica 12/31/2017  . Atrial fibrillation ([place]) 09/10/2017  . Trapezius strain, right, initial encounter 09/10/2017  . Depression, major, single episode, mild ([place]) 09/10/2017  . Chronic venous insufficiency 09/09/2017  . Lymphedema 09/09/2017  . Pain of toe of left foot 08/17/2017  . Leg swelling 07/31/2017  . Dry eyes 07/06/2017  . Pain and swelling of lower extremity,  right 03/29/2017  . Right shoulder pain 03/29/2017  . Diarrhea 05/18/2016  . Fall 05/01/2016  . Leg weakness, bilateral 12/30/2015  . Macrocytic anemia 05/19/2013  . Psoriatic arthritis ([place]) 02/03/2013  . Right hand pain 08/05/2012  . Psoriasis   . Osteoarthritis, multiple sites   . History of breast cancer 10/28/2011  . GERD (gastroesophageal reflux disease)   . Hypertension   . Osteopenia   . Carotid stenosis 05/27/2011    Blythe Stanford, PT DPT 12/21/2019, 9:39 AM  [facility] [address] [place], [address] Phone: [phone]   Fax:  [phone]  Name: [name] MRN: [id number] Date of Birth: 13-Feb-1941

## 2019-12-26 ENCOUNTER — Ambulatory Visit: Payer: Medicare Other

## 2019-12-28 ENCOUNTER — Other Ambulatory Visit: Payer: Self-pay

## 2019-12-28 ENCOUNTER — Ambulatory Visit: Payer: Medicare Other

## 2019-12-28 DIAGNOSIS — M545 Low back pain, unspecified: Secondary | ICD-10-CM

## 2019-12-28 DIAGNOSIS — M6281 Muscle weakness (generalized): Secondary | ICD-10-CM

## 2019-12-28 DIAGNOSIS — R2681 Unsteadiness on feet: Secondary | ICD-10-CM | POA: Diagnosis not present

## 2019-12-28 DIAGNOSIS — Z9181 History of falling: Secondary | ICD-10-CM | POA: Diagnosis not present

## 2019-12-28 DIAGNOSIS — M25511 Pain in right shoulder: Secondary | ICD-10-CM | POA: Diagnosis not present

## 2019-12-28 DIAGNOSIS — M25551 Pain in right hip: Secondary | ICD-10-CM

## 2019-12-28 NOTE — Therapy (Signed)
[facility] [address], [place], [address] Phone: [phone]   Fax:  [phone]  Physical Therapy Treatment  Patient Details  Name: [name] MRN: [id number] Date of Birth: October 28, 1940 Referring Provider (PT): Tamala Julian   Encounter Date: 12/28/2019   PT End of Session - 12/28/19 1037    Visit Number 25    Number of Visits 31    Date for PT Re-Evaluation 01/16/20    PT Start Time 1033    PT Stop Time 1115    PT Time Calculation (min) 42 min    Equipment Utilized During Treatment Gait belt    Activity Tolerance Patient tolerated treatment well;No increased pain    Behavior During Therapy WFL for tasks assessed/performed           Past Medical History:  Diagnosis Date  . (HFpEF) heart failure with preserved ejection fraction ([place])    a. 08/2017 Echo: EF 55-60%, no rwma, mild MR, mildly dil LA, nl RV fxn.  . Breast cancer ([place]) 2001   left breast  . Cancer ([place]) 2001   left breast ca  . Carotid arterial disease ([place])    a. 10/2004 s/p L CEA; b. 12/2015 Carotid U/S: RICA 1-39%; b. LICA patent CEA site.  Marland Kitchen GERD (gastroesophageal reflux disease)   . Hyperlipidemia   . Hypertension   . Osteoarthritis, multiple sites   . Osteopenia   . Persistent atrial fibrillation ([place])    a. Dx 08/2017; b. CHA2DS2VASc = 6-->Xarelto; c. 09/2017 Successful DCCV (second shock - 200J);   Marland Kitchen Personal history of radiation therapy 2001   left breast ca  . Psoriasis     Past Surgical History:  Procedure Laterality Date  . ABDOMINAL HYSTERECTOMY    . ANKLE FRACTURE SURGERY  4/08   left---hardware still in place  . 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 BREAST EXCISIONAL BIOPSY Left yrs ago   benign  . BREAST LUMPECTOMY Left 2001   f/u radiation  . CARDIOVERSION N/A 10/04/2017   Procedure: CARDIOVERSION;  Surgeon: Wellington Hampshire, MD;  Location: [facility] ORS;  Service: Cardiovascular;  Laterality: N/A;  .  CARDIOVERSION N/A 12/16/2017   Procedure: CARDIOVERSION;  Surgeon: [name], MD;  Location: [facility] ORS;  Service: Cardiovascular;  Laterality: N/A;  . CAROTID ENDARTERECTOMY Left 10/23/2004  . FRACTURE SURGERY    . OOPHORECTOMY    . SHOULDER SURGERY  6/07   left  . TONSILLECTOMY AND ADENOIDECTOMY    . TOTAL HIP ARTHROPLASTY  2004   right    There were no vitals filed for this visit.   Subjective Assessment - 12/28/19 1035    Subjective Pt reports doing well in general this date. Pt had a visit from her grandson who is now living in a [place].    Pertinent History Patient previously seen for balance difficulties, shoulder pain and the right, and low back pain.  Patient reports no falls in the past six months. Patient reports pain has not improved for the past month and has been about the same since the onset of pain.    Limitations Sitting;Standing;Walking    Patient Stated Goals To improve balance when walking, driving pain    Currently in Pain? No/denies              PT Short Term Goals - 10/26/19 1540      PT SHORT TERM GOAL #1  Title Patient will have a worst pain score of 6/10 to better be able to drive with less overall pain    Baseline 8/10; 10/26/2019: 6/10    Time 3    Period Weeks    Status On-going    Target Date 10/09/19             PT Long Term Goals - 12/06/19 1043      PT LONG TERM GOAL #1   Title Patient will be independent with HEP to continue benefits of therapy after discharge.     Baseline Dependent with form and technique for exercises; 10/26/2019: cueing needed to complete HEP; 12/06/2019:  performing exercises at home    Time 6    Period Weeks    Status On-going      PT LONG TERM GOAL #2   Title Patient will improve 59mWT to over 1 m/s to indicate increased safety with ambulating in a community environment.    Baseline .66 m/s; 10/26/2019: .75 m/s ; 12/06/2019: . 8 m/s    Time 6    Period Weeks    Status On-going       PT LONG TERM GOAL #3   Title Patient will improve FGA to 63 to indicate significant functional improvement of R hip function.    Baseline FGA: 44; 10/26/2019: FGA: 48; 5/192/2019: 53    Time 6    Period Weeks    Status On-going      PT LONG TERM GOAL #4   Title Patient will be able to balance for 10sec with SLS to improve static balance and ability to get dressed when in standing    Baseline 2.5 sec B; 10/26/2019: 2.5 sec; 12/06/2019: 3 sec    Time 6    Period Weeks    Status On-going      PT LONG TERM GOAL #5   Title Patient will have a worst hip pain of a 3/10 over the past week to indicate functional improvement with transferring and driving    Baseline worst pain: 8/10; 10/26/2019: 6/10; 12/06/2019: 3/10    Time 6    Period Weeks    Status Achieved           INTERVENTION THIS DATE:   Therapeutic Exercise Sit to stands with use of TRX straps - 2 x 20  Mini Lunges in standing with UE support - 2 x 15 Side stepping with YTB around ankles -- 2 x 77ft; 2 x 75ft B   Neuromuscular Reeducation Feet semi-tandem (1/2 step, normal width) on airex pad - 1x60sec bilat Feet semi-tandem (1/2 step, normal width) on airex pad- horizontal head turns x3 bilat in each stance Leg swings forward/backward B with UE support - x 2 min Semitandem stance (wide step through) on frim surface with pink ball self toss/catch x15 each way  Hip transverse plane rainbows with use of furniture slider in standing - x10B Running woman with furniture slider - x 10 B  *Performed exercises to improve LE and hip strengthening       Plan - 12/28/19 1039    Clinical Impression Statement Continued to work on SL stability and motor control. Pt quite fmailiar with her current program, given cues for sequence, intermittent minGuard assistance for >10 LOB in session. Pt has good awareness of LOB and attempts to correct, but hip/truk strategy is often insufficient for independent recovery. Progressed static balance  training this date to include more visual system challenge and more postural stabilization challenge. Pt  takes rest breaks as needed, is quite fatigued by end of session. Pt continues to make steady progress toward treatment goals. Pt will continue to benefit from skilled PT intervention to address impairment and deficits outline in evaluation in order to reduce falls risk, improve tolerance to IADL, and restore to PLOF.     Personal Factors and Comorbidities Age;Comorbidity 2    Comorbidities hip fx, RA    Examination-Activity Limitations Lift;Squat;Stand;Transfers    Examination-Participation Restrictions Driving    Stability/Clinical Decision Making Evolving/Moderate complexity    Clinical Decision Making Moderate    Rehab Potential Good    PT Frequency 2x / week    PT Duration 6 weeks    PT Treatment/Interventions Neuromuscular re-education;Stair training;Moist Heat;Cryotherapy;Iontophoresis 4mg /ml Dexamethasone;Electrical Stimulation;DME Instruction;Therapeutic activities;Therapeutic exercise;Balance training;Manual techniques;Patient/family education;Passive range of motion;Dry needling;Joint Manipulations    PT Next Visit Plan progress hip strengthening, use of SPC    PT Home Exercise Plan See education section           Patient will benefit from skilled therapeutic intervention in order to improve the following deficits and impairments:  Abnormal gait, Decreased activity tolerance, Decreased endurance, Decreased range of motion, Decreased strength, Decreased balance, Difficulty walking, Decreased cognition, Pain, Decreased coordination, Increased muscle spasms, Postural dysfunction, Hypomobility  Visit Diagnosis: Unsteadiness on feet  History of falling  Pain in right hip  Acute bilateral low back pain without sciatica  Muscle weakness (generalized)  Acute pain of right shoulder     Problem List Patient Active Problem List   Diagnosis Date Noted  . Closed right hip  fracture ([place]) 09/20/2019  . Gout 05/18/2019  . Clavi 03/09/2019  . Right hip pain 07/08/2018  . Chronic bilateral low back pain without sciatica 12/31/2017  . Atrial fibrillation ([place]) 09/10/2017  . Trapezius strain, right, initial encounter 09/10/2017  . Depression, major, single episode, mild ([place]) 09/10/2017  . Chronic venous insufficiency 09/09/2017  . Lymphedema 09/09/2017  . Pain of toe of left foot 08/17/2017  . Leg swelling 07/31/2017  . Dry eyes 07/06/2017  . Pain and swelling of lower extremity, right 03/29/2017  . Right shoulder pain 03/29/2017  . Diarrhea 05/18/2016  . Fall 05/01/2016  . Leg weakness, bilateral 12/30/2015  . Macrocytic anemia 05/19/2013  . Psoriatic arthritis ([place]) 02/03/2013  . Right hand pain 08/05/2012  . Psoriasis   . Osteoarthritis, multiple sites   . History of breast cancer 10/28/2011  . GERD (gastroesophageal reflux disease)   . Hypertension   . Osteopenia   . Carotid stenosis 05/27/2011   11:19 AM, 12/28/19 Etta Grandchild, PT, DPT Physical Therapist - [facility]  [phone] ([place])    [place] C 12/28/2019, 10:53 AM  [facility] [address] [place], [address] Phone: [phone]   Fax:  [phone]  Name: JORDANNE ELSBURY MRN: [id number] Date of Birth: 10-Jul-1941

## 2020-01-01 ENCOUNTER — Other Ambulatory Visit: Payer: Self-pay | Admitting: Family Medicine

## 2020-01-01 DIAGNOSIS — Z1231 Encounter for screening mammogram for malignant neoplasm of breast: Secondary | ICD-10-CM

## 2020-01-02 ENCOUNTER — Other Ambulatory Visit: Payer: Self-pay

## 2020-01-02 ENCOUNTER — Ambulatory Visit: Payer: Medicare Other

## 2020-01-02 DIAGNOSIS — M6281 Muscle weakness (generalized): Secondary | ICD-10-CM

## 2020-01-02 DIAGNOSIS — Z9181 History of falling: Secondary | ICD-10-CM

## 2020-01-02 DIAGNOSIS — M25551 Pain in right hip: Secondary | ICD-10-CM | POA: Diagnosis not present

## 2020-01-02 DIAGNOSIS — M545 Low back pain, unspecified: Secondary | ICD-10-CM

## 2020-01-02 DIAGNOSIS — M25511 Pain in right shoulder: Secondary | ICD-10-CM

## 2020-01-02 DIAGNOSIS — R2681 Unsteadiness on feet: Secondary | ICD-10-CM

## 2020-01-02 NOTE — Therapy (Signed)
[facility] [address], [place], [address] Phone: [phone]   Fax:  [phone]  Physical Therapy Treatment  Patient Details  Name: [name] MRN: [id number] Date of Birth: 1940-09-01 Referring Provider (PT): Tamala Julian   Encounter Date: 01/02/2020   PT End of Session - 01/02/20 1041    Visit Number 26    Number of Visits 31    Date for PT Re-Evaluation 01/16/20    PT Start Time 4481    PT Stop Time 1112    PT Time Calculation (min) 40 min    Equipment Utilized During Treatment Gait belt    Activity Tolerance Patient tolerated treatment well;No increased pain    Behavior During Therapy WFL for tasks assessed/performed           Past Medical History:  Diagnosis Date  . (HFpEF) heart failure with preserved ejection fraction ([place])    a. 08/2017 Echo: EF 55-60%, no rwma, mild MR, mildly dil LA, nl RV fxn.  . Breast cancer ([place]) 2001   left breast  . Cancer ([place]) 2001   left breast ca  . Carotid arterial disease ([place])    a. 10/2004 s/p L CEA; b. 12/2015 Carotid U/S: RICA 1-39%; b. LICA patent CEA site.  Marland Kitchen GERD (gastroesophageal reflux disease)   . Hyperlipidemia   . Hypertension   . Osteoarthritis, multiple sites   . Osteopenia   . Persistent atrial fibrillation ([place])    a. Dx 08/2017; b. CHA2DS2VASc = 6-->Xarelto; c. 09/2017 Successful DCCV (second shock - 200J);   Marland Kitchen Personal history of radiation therapy 2001   left breast ca  . Psoriasis     Past Surgical History:  Procedure Laterality Date  . ABDOMINAL HYSTERECTOMY    . ANKLE FRACTURE SURGERY  4/08   left---hardware still in place  . 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 BREAST EXCISIONAL BIOPSY Left yrs ago   benign  . BREAST LUMPECTOMY Left 2001   f/u radiation  . CARDIOVERSION N/A 10/04/2017   Procedure: CARDIOVERSION;  Surgeon: Wellington Hampshire, MD;  Location: [facility] ORS;  Service: Cardiovascular;  Laterality: N/A;  .  CARDIOVERSION N/A 12/16/2017   Procedure: CARDIOVERSION;  Surgeon: [name], MD;  Location: [facility] ORS;  Service: Cardiovascular;  Laterality: N/A;  . CAROTID ENDARTERECTOMY Left 10/23/2004  . FRACTURE SURGERY    . OOPHORECTOMY    . SHOULDER SURGERY  6/07   left  . TONSILLECTOMY AND ADENOIDECTOMY    . TOTAL HIP ARTHROPLASTY  2004   right    There were no vitals filed for this visit.   Subjective Assessment - 01/02/20 1035    Subjective Pt in a falre of pain in back, knees, hands etc, no clear reason. Pt already did he rsilver sneakers this morning, only doign actiivties tolerated.    Pertinent History Patient previously seen for balance difficulties, shoulder pain and the right, and low back pain.  Patient reports no falls in the past six months. Patient reports pain has not improved for the past month and has been about the same since the onset of pain.    Limitations Sitting;Standing;Walking    Patient Stated Goals To improve balance when walking, driving pain    Currently in Pain? Yes    Pain Score 6     Pain Location --   multiple places          INTERVENTION THIS DATE: -Sit to  stands with use of TRX straps - 1x15  -Mini Lunges in standing with UE support - 1X15 -Leg swings frontal and sagittal 1x15 bilat each   SPC training for gait stability and righting  -ladder drills FWD 2x, lateral 1x each way, retro AMB c SPC, FWD eyes closed x71ft  -SPC gait training for straight plane: 192ft, RUE SPC 2-point gait, SPC sequenced with LLE  - soft surface stepping on mat (3x fwd/retro; 3x Rt/Lt side stepping)   -Feet semi-tandem (1/2 step, normal width) on airex pad- horizontal head turns x3 bilat in each stance     PT Short Term Goals - 10/26/19 1540      PT SHORT TERM GOAL #1   Title Patient will have a worst pain score of 6/10 to better be able to drive with less overall pain    Baseline 8/10; 10/26/2019: 6/10    Time 3    Period Weeks    Status On-going    Target Date  10/09/19             PT Long Term Goals - 12/06/19 1043      PT LONG TERM GOAL #1   Title Patient will be independent with HEP to continue benefits of therapy after discharge.     Baseline Dependent with form and technique for exercises; 10/26/2019: cueing needed to complete HEP; 12/06/2019:  performing exercises at home    Time 6    Period Weeks    Status On-going      PT LONG TERM GOAL #2   Title Patient will improve 66mWT to over 1 m/s to indicate increased safety with ambulating in a community environment.    Baseline .66 m/s; 10/26/2019: .75 m/s ; 12/06/2019: . 8 m/s    Time 6    Period Weeks    Status On-going      PT LONG TERM GOAL #3   Title Patient will improve FGA to 63 to indicate significant functional improvement of R hip function.    Baseline FGA: 44; 10/26/2019: FGA: 48; 5/192/2019: 53    Time 6    Period Weeks    Status On-going      PT LONG TERM GOAL #4   Title Patient will be able to balance for 10sec with SLS to improve static balance and ability to get dressed when in standing    Baseline 2.5 sec B; 10/26/2019: 2.5 sec; 12/06/2019: 3 sec    Time 6    Period Weeks    Status On-going      PT LONG TERM GOAL #5   Title Patient will have a worst hip pain of a 3/10 over the past week to indicate functional improvement with transferring and driving    Baseline worst pain: 8/10; 10/26/2019: 6/10; 12/06/2019: 3/10    Time 6    Period Weeks    Status Achieved                 Plan - 01/02/20 1044    Clinical Impression Statement Continued with current plan of care, gently progressing patient's program aimed at address deficits and limitations identified in evlauation. Pt continues to make steady progress toward treatment goals in general. Pt given time to begin more SPC gait training, which goes very well, minimal cues needed, but practice will be needed for LT carryover. Author provides extensive verbal, visual, and tactile cues when needed to assure all  interventions are performed with desired form and good accuracy. Extensive communicaiton to  assure pt is able to perform all activities without exacerbation of pain or other symptoms.    Personal Factors and Comorbidities Age;Comorbidity 2    Comorbidities hip fx, RA    Examination-Activity Limitations Lift;Squat;Stand;Transfers    Examination-Participation Restrictions Driving    Stability/Clinical Decision Making Evolving/Moderate complexity    Rehab Potential Good    PT Frequency 2x / week    PT Duration 6 weeks    PT Treatment/Interventions Neuromuscular re-education;Stair training;Moist Heat;Cryotherapy;Iontophoresis 4mg /ml Dexamethasone;Electrical Stimulation;DME Instruction;Therapeutic activities;Therapeutic exercise;Balance training;Manual techniques;Patient/family education;Passive range of motion;Dry needling;Joint Manipulations    PT Next Visit Plan progress hip strengthening, use of SPC    PT Home Exercise Plan See education section    Consulted and Agree with Plan of Care Patient           Patient will benefit from skilled therapeutic intervention in order to improve the following deficits and impairments:  Abnormal gait, Decreased activity tolerance, Decreased endurance, Decreased range of motion, Decreased strength, Decreased balance, Difficulty walking, Decreased cognition, Pain, Decreased coordination, Increased muscle spasms, Postural dysfunction, Hypomobility  Visit Diagnosis: Unsteadiness on feet  History of falling  Pain in right hip  Acute bilateral low back pain without sciatica  Muscle weakness (generalized)  Acute pain of right shoulder     Problem List Patient Active Problem List   Diagnosis Date Noted  . Closed right hip fracture ([place]) 09/20/2019  . Gout 05/18/2019  . Clavi 03/09/2019  . Right hip pain 07/08/2018  . Chronic bilateral low back pain without sciatica 12/31/2017  . Atrial fibrillation ([place]) 09/10/2017  . Trapezius strain, right,  initial encounter 09/10/2017  . Depression, major, single episode, mild ([place]) 09/10/2017  . Chronic venous insufficiency 09/09/2017  . Lymphedema 09/09/2017  . Pain of toe of left foot 08/17/2017  . Leg swelling 07/31/2017  . Dry eyes 07/06/2017  . Pain and swelling of lower extremity, right 03/29/2017  . Right shoulder pain 03/29/2017  . Diarrhea 05/18/2016  . Fall 05/01/2016  . Leg weakness, bilateral 12/30/2015  . Macrocytic anemia 05/19/2013  . Psoriatic arthritis ([place]) 02/03/2013  . Right hand pain 08/05/2012  . Psoriasis   . Osteoarthritis, multiple sites   . History of breast cancer 10/28/2011  . GERD (gastroesophageal reflux disease)   . Hypertension   . Osteopenia   . Carotid stenosis 05/27/2011   11:12 AM, 01/02/20 Etta Grandchild, PT, DPT Physical Therapist - [facility] [phone] (Office)   Adilson Grafton C 01/02/2020, 10:46 AM  [facility] [address] [place], [address] Phone: [phone]   Fax:  [phone]  Name: [name] MRN: [id number] Date of Birth: 1941/03/27

## 2020-01-03 ENCOUNTER — Other Ambulatory Visit: Payer: Self-pay

## 2020-01-03 MED ORDER — METOPROLOL TARTRATE 50 MG PO TABS: ORAL_TABLET | ORAL | 1 refills | Status: DC

## 2020-01-04 ENCOUNTER — Ambulatory Visit: Payer: Medicare Other

## 2020-01-04 ENCOUNTER — Other Ambulatory Visit: Payer: Self-pay

## 2020-01-04 DIAGNOSIS — M25551 Pain in right hip: Secondary | ICD-10-CM

## 2020-01-04 DIAGNOSIS — M6281 Muscle weakness (generalized): Secondary | ICD-10-CM | POA: Diagnosis not present

## 2020-01-04 DIAGNOSIS — R2681 Unsteadiness on feet: Secondary | ICD-10-CM | POA: Diagnosis not present

## 2020-01-04 DIAGNOSIS — Z9181 History of falling: Secondary | ICD-10-CM

## 2020-01-04 DIAGNOSIS — M545 Low back pain: Secondary | ICD-10-CM | POA: Diagnosis not present

## 2020-01-04 DIAGNOSIS — M25511 Pain in right shoulder: Secondary | ICD-10-CM | POA: Diagnosis not present

## 2020-01-04 IMAGING — MG DIGITAL SCREENING BILATERAL MAMMOGRAM WITH TOMO AND CAD
6 of 10 series · 6 of 30 positions shown · non-contrast
Comparison: Previous exam(s).

CLINICAL DATA: Screening. History of treated left breast cancer,
status post breast conservation therapy in 8994.

EXAM:
DIGITAL SCREENING BILATERAL MAMMOGRAM WITH TOMO AND CAD

[L MLO synth-2D]
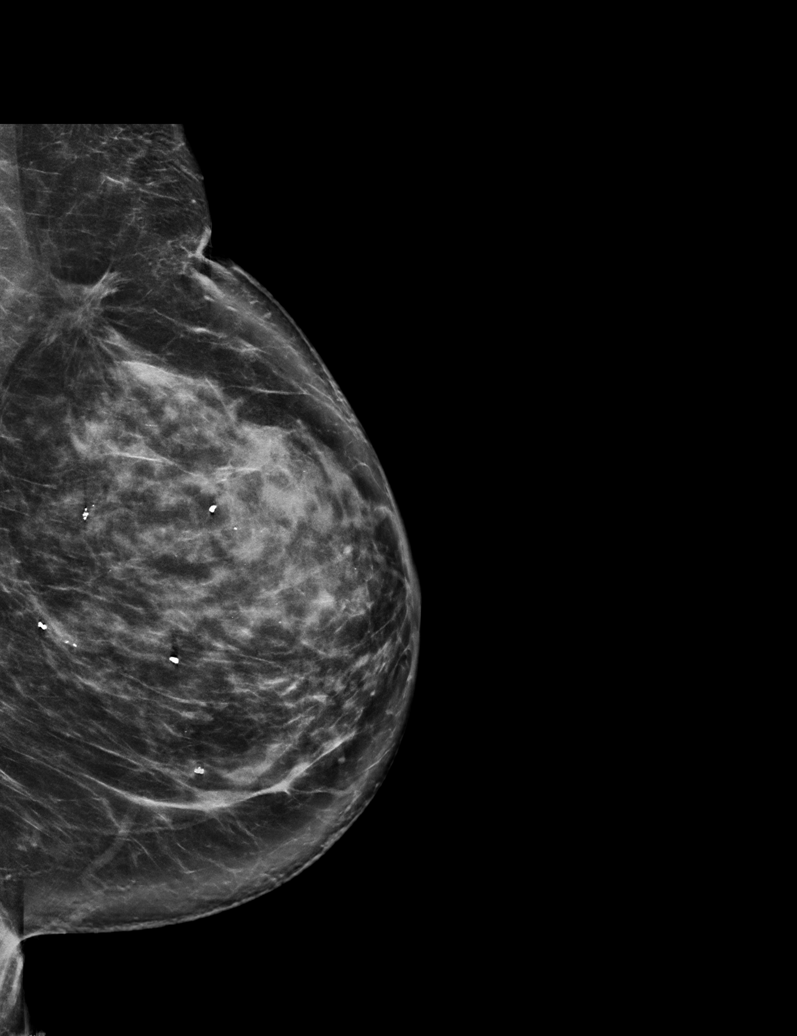

[R MLO synth-2D]
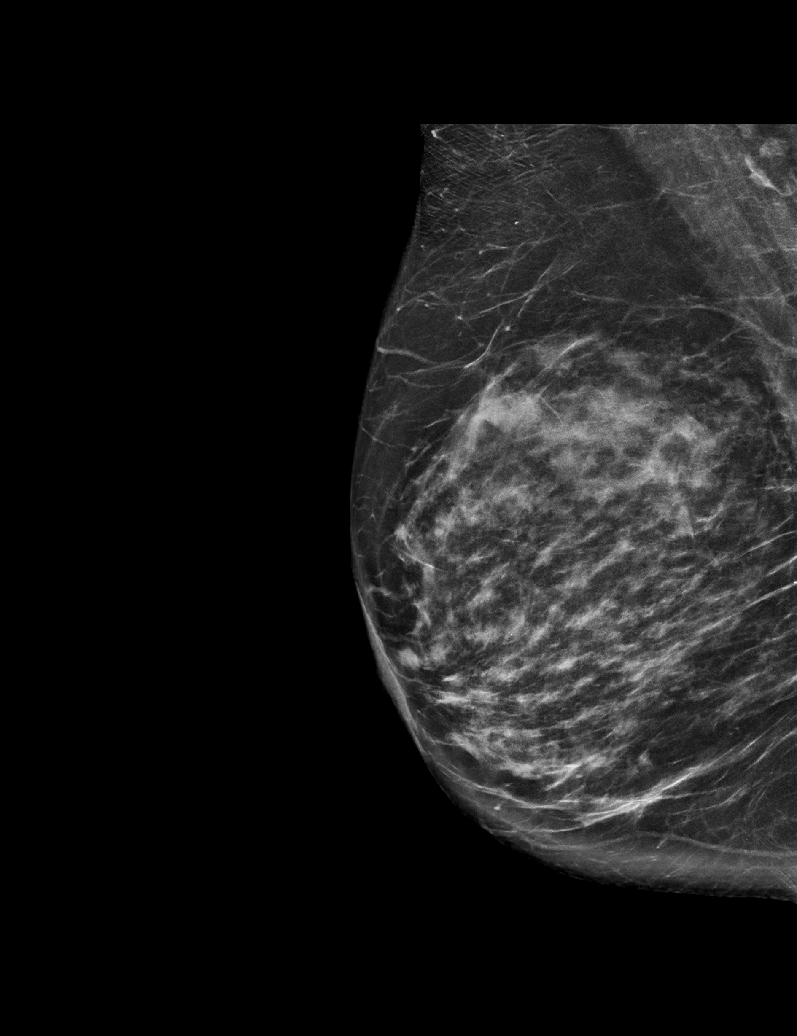

[L XCCL synth-2D]
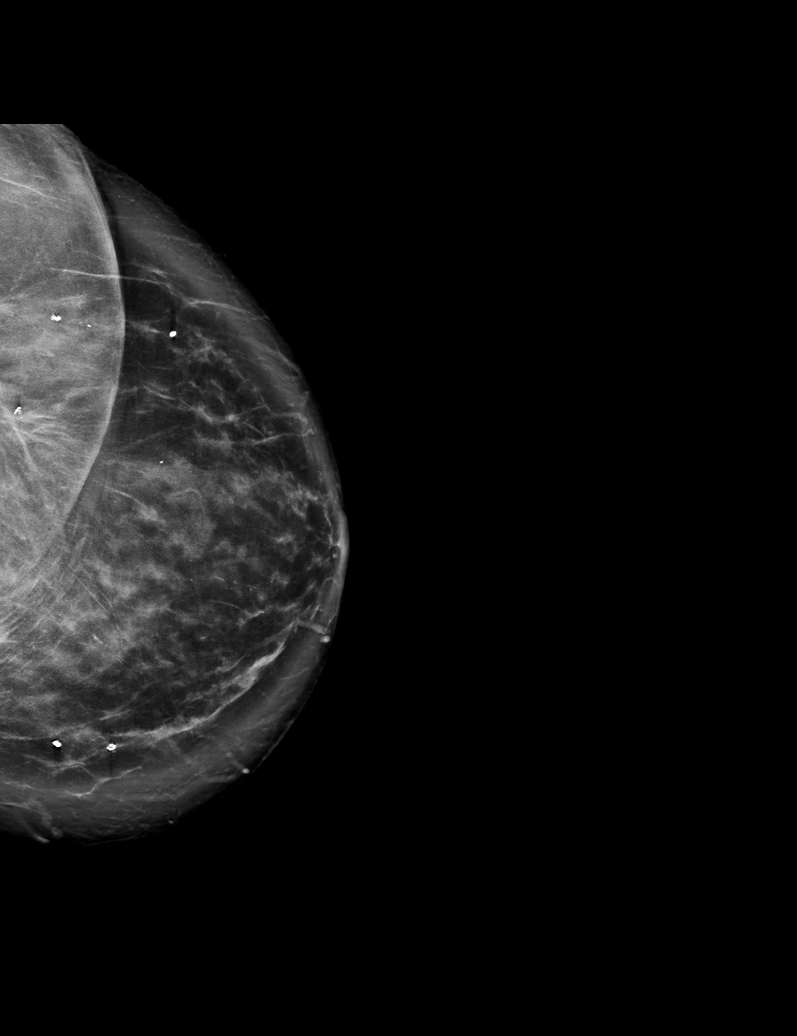

[L CC synth-2D]
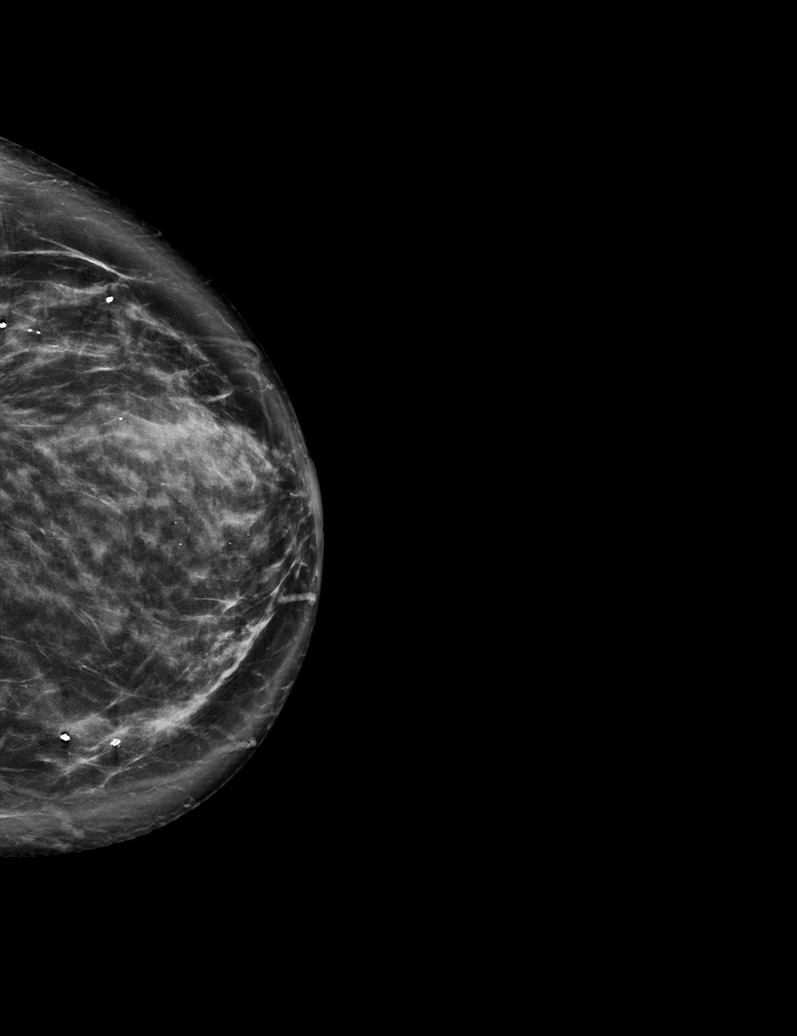

[R CC synth-2D]
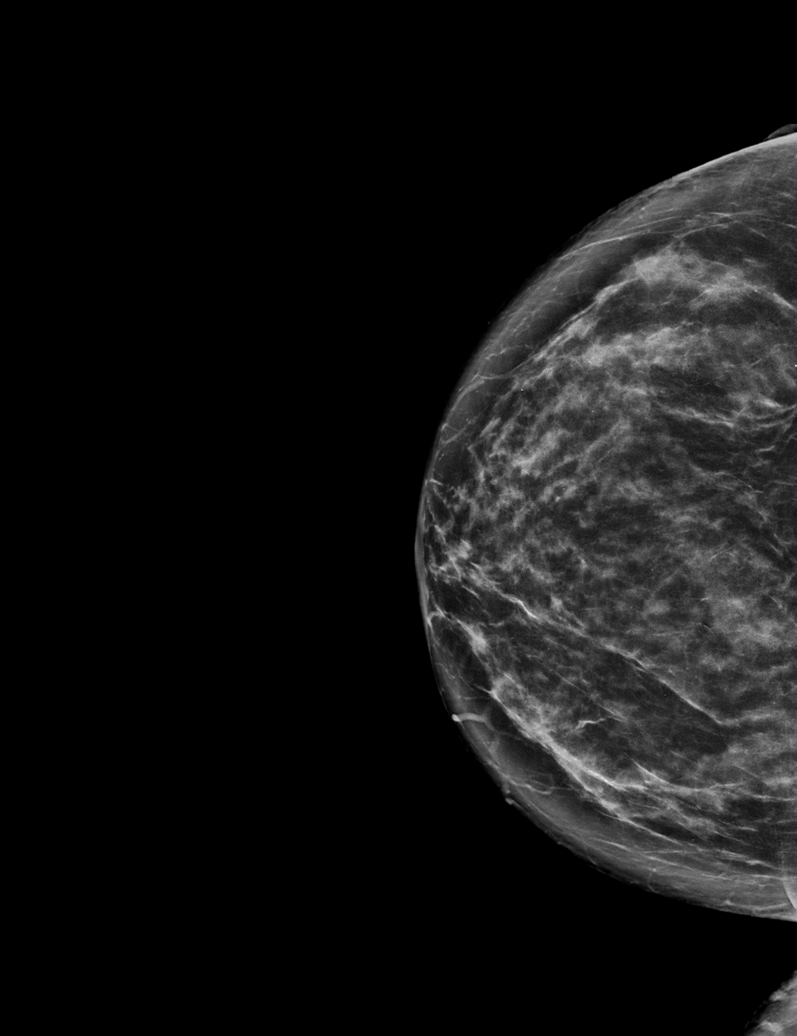

[R MLO tomo · tomo slice 37/73.0]
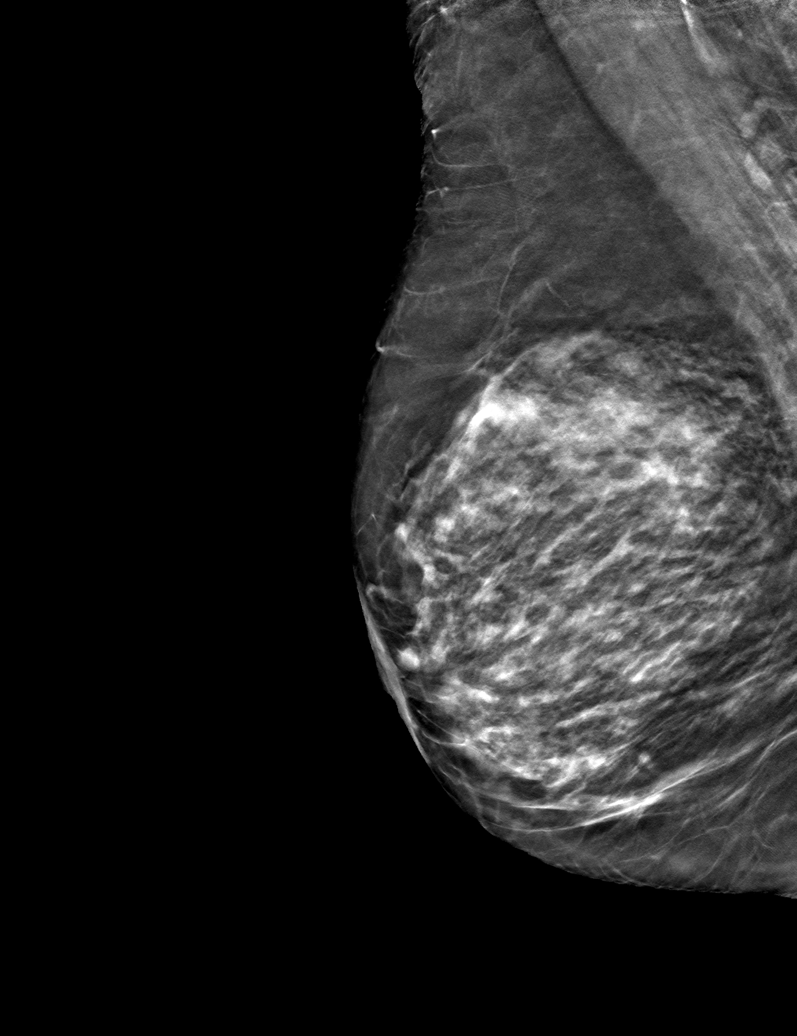

[6 of 30 positions shown; findings below may reference images not displayed]

ACR Breast Density Category c: The breast tissue is heterogeneously
dense, which may obscure small masses.
FINDINGS: There are no findings suspicious for malignancy. Stable
posttreatment changes in the left breast. Images were processed with
CAD.
IMPRESSION: No mammographic evidence of malignancy. A result letter of this
screening mammogram will be mailed directly to the patient.

RECOMMENDATION:
Screening mammogram in one year. (Code:HX-N-SRT)

BI-RADS CATEGORY  1: Negative.

## 2020-01-04 NOTE — Therapy (Addendum)
[facility] [address], [place], [address] Phone: [phone]   Fax:  [phone]  Physical Therapy Treatment  Patient Details  Name: [name] MRN: [id number] Date of Birth: September 24, 1940 Referring Provider (PT): Tamala Julian   Encounter Date: 01/04/2020   PT End of Session - 01/04/20 1121    Visit Number 27    Number of Visits 31    Date for PT Re-Evaluation 01/16/20    Authorization Type 3/10 Medicare    PT Start Time 1115    PT Stop Time 1155    PT Time Calculation (min) 40 min    Equipment Utilized During Treatment Gait belt    Activity Tolerance Patient tolerated treatment well;No increased pain    Behavior During Therapy WFL for tasks assessed/performed           Past Medical History:  Diagnosis Date  . (HFpEF) heart failure with preserved ejection fraction ([place])    a. 08/2017 Echo: EF 55-60%, no rwma, mild MR, mildly dil LA, nl RV fxn.  . Breast cancer ([place]) 2001   left breast  . Cancer ([place]) 2001   left breast ca  . Carotid arterial disease ([place])    a. 10/2004 s/p L CEA; b. 12/2015 Carotid U/S: RICA 1-39%; b. LICA patent CEA site.  Marland Kitchen GERD (gastroesophageal reflux disease)   . Hyperlipidemia   . Hypertension   . Osteoarthritis, multiple sites   . Osteopenia   . Persistent atrial fibrillation ([place])    a. Dx 08/2017; b. CHA2DS2VASc = 6-->Xarelto; c. 09/2017 Successful DCCV (second shock - 200J);   Marland Kitchen Personal history of radiation therapy 2001   left breast ca  . Psoriasis     Past Surgical History:  Procedure Laterality Date  . ABDOMINAL HYSTERECTOMY    . ANKLE FRACTURE SURGERY  4/08   left---hardware still in place  . 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 BREAST EXCISIONAL BIOPSY Left yrs ago   benign  . BREAST LUMPECTOMY Left 2001   f/u radiation  . CARDIOVERSION N/A 10/04/2017   Procedure: CARDIOVERSION;  Surgeon: Wellington Hampshire, MD;  Location: [facility] ORS;  Service:  Cardiovascular;  Laterality: N/A;  . CARDIOVERSION N/A 12/16/2017   Procedure: CARDIOVERSION;  Surgeon: [name], MD;  Location: [facility] ORS;  Service: Cardiovascular;  Laterality: N/A;  . CAROTID ENDARTERECTOMY Left 10/23/2004  . FRACTURE SURGERY    . OOPHORECTOMY    . SHOULDER SURGERY  6/07   left  . TONSILLECTOMY AND ADENOIDECTOMY    . TOTAL HIP ARTHROPLASTY  2004   right    There were no vitals filed for this visit.   Subjective Assessment - 01/04/20 1119    Subjective Pt reports that her back in doing better as of todays session but is still having some pain.    Pertinent History Patient previously seen for balance difficulties, shoulder pain and the right, and low back pain.  Patient reports no falls in the past six months. Patient reports pain has not improved for the past month and has been about the same since the onset of pain.    Limitations Sitting;Standing;Walking    Patient Stated Goals To improve balance when walking, driving pain    Currently in Pain? Yes    Pain Score 4           INTERVENTIONS  Therapeutic Exercise  -Sit to stands with use of TRX straps - 2x15  -  Mini Lunges in standing with UE support - 1X15 -Leg swings frontal and sagittal 1x15 bilat each    SPC training for gait stability and righting  -Figure 8's x5  -SPC gait training for straight plane: 187ft, RUE SPC 2-point gait, SPC sequenced with LLE (Outside in parking lot and using sidewalk) -SPC gait training using steps/curb x 4 (curb outside) -Feet semi-tandem (1/2 step, normal width) on airex pad- horizontal head turns and vertical head movement x3 bilat in each stance -SPC side stepping x15 bilat.          PT Education - 01/04/20 1121    Education Details form/technqiue with exercise    Person(s) Educated Patient    Methods Explanation;Demonstration    Comprehension Verbalized understanding;Returned demonstration            PT Short Term Goals - 10/26/19 1540      PT SHORT  TERM GOAL #1   Title Patient will have a worst pain score of 6/10 to better be able to drive with less overall pain    Baseline 8/10; 10/26/2019: 6/10    Time 3    Period Weeks    Status On-going    Target Date 10/09/19             PT Long Term Goals - 12/06/19 1043      PT LONG TERM GOAL #1   Title Patient will be independent with HEP to continue benefits of therapy after discharge.     Baseline Dependent with form and technique for exercises; 10/26/2019: cueing needed to complete HEP; 12/06/2019:  performing exercises at home    Time 6    Period Weeks    Status On-going      PT LONG TERM GOAL #2   Title Patient will improve 51mWT to over 1 m/s to indicate increased safety with ambulating in a community environment.    Baseline .66 m/s; 10/26/2019: .75 m/s ; 12/06/2019: . 8 m/s    Time 6    Period Weeks    Status On-going      PT LONG TERM GOAL #3   Title Patient will improve FGA to 63 to indicate significant functional improvement of R hip function.    Baseline FGA: 44; 10/26/2019: FGA: 48; 5/192/2019: 53    Time 6    Period Weeks    Status On-going      PT LONG TERM GOAL #4   Title Patient will be able to balance for 10sec with SLS to improve static balance and ability to get dressed when in standing    Baseline 2.5 sec B; 10/26/2019: 2.5 sec; 12/06/2019: 3 sec    Time 6    Period Weeks    Status On-going      PT LONG TERM GOAL #5   Title Patient will have a worst hip pain of a 3/10 over the past week to indicate functional improvement with transferring and driving    Baseline worst pain: 8/10; 10/26/2019: 6/10; 12/06/2019: 3/10    Time 6    Period Weeks    Status Achieved                 Plan - 01/04/20 1122    Clinical Impression Statement Pt able to perform SPC gait training with better sequencing of cane and LE (L LE with cane in RUE).  Pt completed all SPC gait training without any episode of LOB or sway during gait cycle. Pt continues to improve with gait  stability and right and will benefit from skilled PT in order to progress towards goals and improve strengthening and stability.    Personal Factors and Comorbidities Age;Comorbidity 2    Comorbidities hip fx, RA    Examination-Activity Limitations Lift;Squat;Stand;Transfers    Examination-Participation Restrictions Driving    Stability/Clinical Decision Making Evolving/Moderate complexity    Clinical Decision Making Moderate    Rehab Potential Good    PT Frequency 2x / week    PT Duration 6 weeks    PT Treatment/Interventions Neuromuscular re-education;Stair training;Moist Heat;Cryotherapy;Iontophoresis 4mg /ml Dexamethasone;Electrical Stimulation;DME Instruction;Therapeutic activities;Therapeutic exercise;Balance training;Manual techniques;Patient/family education;Passive range of motion;Dry needling;Joint Manipulations    PT Next Visit Plan progress hip strengthening, use of SPC    PT Home Exercise Plan See education section    Consulted and Agree with Plan of Care Patient           Patient will benefit from skilled therapeutic intervention in order to improve the following deficits and impairments:  Abnormal gait, Decreased activity tolerance, Decreased endurance, Decreased range of motion, Decreased strength, Decreased balance, Difficulty walking, Decreased cognition, Pain, Decreased coordination, Increased muscle spasms, Postural dysfunction, Hypomobility  Visit Diagnosis: Unsteadiness on feet  History of falling  Pain in right hip     Problem List Patient Active Problem List   Diagnosis Date Noted  . Closed right hip fracture ([place]) 09/20/2019  . Gout 05/18/2019  . Clavi 03/09/2019  . Right hip pain 07/08/2018  . Chronic bilateral low back pain without sciatica 12/31/2017  . Atrial fibrillation ([place]) 09/10/2017  . Trapezius strain, right, initial encounter 09/10/2017  . Depression, major, single episode, mild ([place]) 09/10/2017  . Chronic venous insufficiency 09/09/2017   . Lymphedema 09/09/2017  . Pain of toe of left foot 08/17/2017  . Leg swelling 07/31/2017  . Dry eyes 07/06/2017  . Pain and swelling of lower extremity, right 03/29/2017  . Right shoulder pain 03/29/2017  . Diarrhea 05/18/2016  . Fall 05/01/2016  . Leg weakness, bilateral 12/30/2015  . Macrocytic anemia 05/19/2013  . Psoriatic arthritis ([place]) 02/03/2013  . Right hand pain 08/05/2012  . Psoriasis   . Osteoarthritis, multiple sites   . History of breast cancer 10/28/2011  . GERD (gastroesophageal reflux disease)   . Hypertension   . Osteopenia   . Carotid stenosis 05/27/2011   This entire session was performed under the direct supervision of a liscensed physical therapist. I have reviewed and agree with student's findings and recommendations.  4:11 PM, 01/04/20 Etta Grandchild, PT, DPT Physical Therapist - [place] [phone] (Office)    11:24 AM, 01/04/20 Margarito Liner, SPT Student Physical Therapist [place]  [phone]  Margarito Liner 01/04/2020, 11:24 AM  [facility] [address], [place], [address] Phone: [phone]   Fax:  [phone]  Name: [name] MRN: [id number] Date of Birth: 1941-05-14

## 2020-01-09 ENCOUNTER — Other Ambulatory Visit: Payer: Self-pay

## 2020-01-09 ENCOUNTER — Ambulatory Visit: Payer: Medicare Other

## 2020-01-09 DIAGNOSIS — M6281 Muscle weakness (generalized): Secondary | ICD-10-CM | POA: Diagnosis not present

## 2020-01-09 DIAGNOSIS — Z9181 History of falling: Secondary | ICD-10-CM

## 2020-01-09 DIAGNOSIS — M545 Low back pain: Secondary | ICD-10-CM | POA: Diagnosis not present

## 2020-01-09 DIAGNOSIS — M25551 Pain in right hip: Secondary | ICD-10-CM

## 2020-01-09 DIAGNOSIS — R2681 Unsteadiness on feet: Secondary | ICD-10-CM

## 2020-01-09 DIAGNOSIS — M25511 Pain in right shoulder: Secondary | ICD-10-CM | POA: Diagnosis not present

## 2020-01-09 NOTE — Therapy (Signed)
[facility] [address], [place], [address] Phone: [phone]   Fax:  [phone]  Physical Therapy Treatment  Patient Details  Name: [name] MRN: [id number] Date of Birth: 1941/03/02 Referring Provider (PT): Tamala Julian   Encounter Date: 01/09/2020   PT End of Session - 01/09/20 1306    Visit Number 28    Number of Visits 31    Date for PT Re-Evaluation 01/16/20    Authorization Type 4/10 Medicare    PT Start Time 1304    PT Stop Time 1344    PT Time Calculation (min) 40 min    Equipment Utilized During Treatment Gait belt    Activity Tolerance Patient tolerated treatment well;No increased pain    Behavior During Therapy WFL for tasks assessed/performed           Past Medical History:  Diagnosis Date  . (HFpEF) heart failure with preserved ejection fraction ([place])    a. 08/2017 Echo: EF 55-60%, no rwma, mild MR, mildly dil LA, nl RV fxn.  . Breast cancer ([place]) 2001   left breast  . Cancer ([place]) 2001   left breast ca  . Carotid arterial disease ([place])    a. 10/2004 s/p L CEA; b. 12/2015 Carotid U/S: RICA 1-39%; b. LICA patent CEA site.  Marland Kitchen GERD (gastroesophageal reflux disease)   . Hyperlipidemia   . Hypertension   . Osteoarthritis, multiple sites   . Osteopenia   . Persistent atrial fibrillation ([place])    a. Dx 08/2017; b. CHA2DS2VASc = 6-->Xarelto; c. 09/2017 Successful DCCV (second shock - 200J);   Marland Kitchen Personal history of radiation therapy 2001   left breast ca  . Psoriasis     Past Surgical History:  Procedure Laterality Date  . ABDOMINAL HYSTERECTOMY    . ANKLE FRACTURE SURGERY  4/08   left---hardware still in place  . 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 BREAST EXCISIONAL BIOPSY Left yrs ago   benign  . BREAST LUMPECTOMY Left 2001   f/u radiation  . CARDIOVERSION N/A 10/04/2017   Procedure: CARDIOVERSION;  Surgeon: Wellington Hampshire, MD;  Location: [facility] ORS;  Service:  Cardiovascular;  Laterality: N/A;  . CARDIOVERSION N/A 12/16/2017   Procedure: CARDIOVERSION;  Surgeon: [name], MD;  Location: [facility] ORS;  Service: Cardiovascular;  Laterality: N/A;  . CAROTID ENDARTERECTOMY Left 10/23/2004  . FRACTURE SURGERY    . OOPHORECTOMY    . SHOULDER SURGERY  6/07   left  . TONSILLECTOMY AND ADENOIDECTOMY    . TOTAL HIP ARTHROPLASTY  2004   right    There were no vitals filed for this visit.   Subjective Assessment - 01/09/20 1304    Subjective P reports that shes still having some pain in her back but it has not been increasing.    Pertinent History Patient previously seen for balance difficulties, shoulder pain and the right, and low back pain.  Patient reports no falls in the past six months. Patient reports pain has not improved for the past month and has been about the same since the onset of pain.    Limitations Sitting;Standing;Walking    Patient Stated Goals To improve balance when walking, driving pain    Currently in Pain? Yes           INTERVENTIONS   Therapeutic Exercise  - Sit to stands with use of TRX straps - 2x15  - Standing Hip Extension with  UE support  x15 bilat. - Mini Lunges in standing with UE support - 1X15 - Leg swings frontal and sagittal 1x15 bilat each  - Lateral step-up x5 bilat on 4inch step - Step-up x 5 bilat on 4inch step  SPC training for gait stability and righting  -Figure 8's x5  -SPC gait training for straight plane: 133ft, RUE SPC 2-point gait, SPC sequenced with LLE (Outside in parking lot and using sidewalk) -SPC gait training using steps/curb x 4 (curb outside) -Feet semi-tandem (1/2 step, normal width) on airex pad- horizontal head turns and vertical head movement x2 min     PT Education - 01/09/20 1306    Education Details form/technique with exercise    Person(s) Educated Patient    Methods Explanation;Demonstration    Comprehension Verbalized understanding;Returned demonstration             PT Short Term Goals - 10/26/19 1540      PT SHORT TERM GOAL #1   Title Patient will have a worst pain score of 6/10 to better be able to drive with less overall pain    Baseline 8/10; 10/26/2019: 6/10    Time 3    Period Weeks    Status On-going    Target Date 10/09/19             PT Long Term Goals - 12/06/19 1043      PT LONG TERM GOAL #1   Title Patient will be independent with HEP to continue benefits of therapy after discharge.     Baseline Dependent with form and technique for exercises; 10/26/2019: cueing needed to complete HEP; 12/06/2019:  performing exercises at home    Time 6    Period Weeks    Status On-going      PT LONG TERM GOAL #2   Title Patient will improve 76mWT to over 1 m/s to indicate increased safety with ambulating in a community environment.    Baseline .66 m/s; 10/26/2019: .75 m/s ; 12/06/2019: . 8 m/s    Time 6    Period Weeks    Status On-going      PT LONG TERM GOAL #3   Title Patient will improve FGA to 63 to indicate significant functional improvement of R hip function.    Baseline FGA: 44; 10/26/2019: FGA: 48; 5/192/2019: 53    Time 6    Period Weeks    Status On-going      PT LONG TERM GOAL #4   Title Patient will be able to balance for 10sec with SLS to improve static balance and ability to get dressed when in standing    Baseline 2.5 sec B; 10/26/2019: 2.5 sec; 12/06/2019: 3 sec    Time 6    Period Weeks    Status On-going      PT LONG TERM GOAL #5   Title Patient will have a worst hip pain of a 3/10 over the past week to indicate functional improvement with transferring and driving    Baseline worst pain: 8/10; 10/26/2019: 6/10; 12/06/2019: 3/10    Time 6    Period Weeks    Status Achieved                 Plan - 01/09/20 1307    Clinical Impression Statement Pt tolerated all exercises today without an increase in pain.  Pt continues to improve SPC gait and ability to maneuver over and around obstacles without LOB. Pt completed  addition of step-ups but is  still hesitant if there is not UE support.  Pt will benefit from skilled PT in order to progress towards goals and return to PLOF.    Personal Factors and Comorbidities Age;Comorbidity 2    Comorbidities hip fx, RA    Examination-Activity Limitations Lift;Squat;Stand;Transfers    Examination-Participation Restrictions Driving    Stability/Clinical Decision Making Evolving/Moderate complexity    Clinical Decision Making Moderate    Rehab Potential Good    PT Frequency 2x / week    PT Duration 6 weeks    PT Treatment/Interventions Neuromuscular re-education;Stair training;Moist Heat;Cryotherapy;Iontophoresis 4mg /ml Dexamethasone;Electrical Stimulation;DME Instruction;Therapeutic activities;Therapeutic exercise;Balance training;Manual techniques;Patient/family education;Passive range of motion;Dry needling;Joint Manipulations    PT Next Visit Plan progress hip strengthening, use of SPC    Consulted and Agree with Plan of Care Patient           Patient will benefit from skilled therapeutic intervention in order to improve the following deficits and impairments:  Abnormal gait, Decreased activity tolerance, Decreased endurance, Decreased range of motion, Decreased strength, Decreased balance, Difficulty walking, Decreased cognition, Pain, Decreased coordination, Increased muscle spasms, Postural dysfunction, Hypomobility  Visit Diagnosis: Unsteadiness on feet  History of falling  Pain in right hip     Problem List Patient Active Problem List   Diagnosis Date Noted  . Closed right hip fracture ([place]) 09/20/2019  . Gout 05/18/2019  . Clavi 03/09/2019  . Right hip pain 07/08/2018  . Chronic bilateral low back pain without sciatica 12/31/2017  . Atrial fibrillation ([place]) 09/10/2017  . Trapezius strain, right, initial encounter 09/10/2017  . Depression, major, single episode, mild ([place]) 09/10/2017  . Chronic venous insufficiency 09/09/2017  . Lymphedema  09/09/2017  . Pain of toe of left foot 08/17/2017  . Leg swelling 07/31/2017  . Dry eyes 07/06/2017  . Pain and swelling of lower extremity, right 03/29/2017  . Right shoulder pain 03/29/2017  . Diarrhea 05/18/2016  . Fall 05/01/2016  . Leg weakness, bilateral 12/30/2015  . Macrocytic anemia 05/19/2013  . Psoriatic arthritis ([place]) 02/03/2013  . Right hand pain 08/05/2012  . Psoriasis   . Osteoarthritis, multiple sites   . History of breast cancer 10/28/2011  . GERD (gastroesophageal reflux disease)   . Hypertension   . Osteopenia   . Carotid stenosis 05/27/2011   1:55 PM, 01/09/20 Margarito Liner, SPT Student Physical Therapist [place]  [phone]  Margarito Liner 01/09/2020, 1:09 PM  [facility] [address], [place], [address] Phone: [phone]   Fax:  [phone]  Name: [name] MRN: [id number] Date of Birth: Dec 14, 1940

## 2020-01-11 ENCOUNTER — Ambulatory Visit: Payer: Medicare Other

## 2020-01-11 ENCOUNTER — Other Ambulatory Visit: Payer: Self-pay

## 2020-01-11 DIAGNOSIS — M6281 Muscle weakness (generalized): Secondary | ICD-10-CM | POA: Diagnosis not present

## 2020-01-11 DIAGNOSIS — M25511 Pain in right shoulder: Secondary | ICD-10-CM | POA: Diagnosis not present

## 2020-01-11 DIAGNOSIS — M25551 Pain in right hip: Secondary | ICD-10-CM | POA: Diagnosis not present

## 2020-01-11 DIAGNOSIS — Z9181 History of falling: Secondary | ICD-10-CM | POA: Diagnosis not present

## 2020-01-11 DIAGNOSIS — R2681 Unsteadiness on feet: Secondary | ICD-10-CM

## 2020-01-11 DIAGNOSIS — M545 Low back pain: Secondary | ICD-10-CM | POA: Diagnosis not present

## 2020-01-11 NOTE — Therapy (Signed)
[facility] [address], [place], [address] Phone: [phone]   Fax:  [phone]  Physical Therapy Treatment  Patient Details  Name: [name] MRN: [id number] Date of Birth: 09/19/1940 Referring Provider (PT): Tamala Julian   Encounter Date: 01/11/2020   PT End of Session - 01/11/20 1307    Visit Number 29    Number of Visits 31    Date for PT Re-Evaluation 01/16/20    Authorization Type 5/10    PT Start Time 1302    PT Stop Time 1342    PT Time Calculation (min) 40 min           Past Medical History:  Diagnosis Date  . (HFpEF) heart failure with preserved ejection fraction ([place])    a. 08/2017 Echo: EF 55-60%, no rwma, mild MR, mildly dil LA, nl RV fxn.  . Breast cancer ([place]) 2001   left breast  . Cancer ([place]) 2001   left breast ca  . Carotid arterial disease ([place])    a. 10/2004 s/p L CEA; b. 12/2015 Carotid U/S: RICA 1-39%; b. LICA patent CEA site.  Marland Kitchen GERD (gastroesophageal reflux disease)   . Hyperlipidemia   . Hypertension   . Osteoarthritis, multiple sites   . Osteopenia   . Persistent atrial fibrillation ([place])    a. Dx 08/2017; b. CHA2DS2VASc = 6-->Xarelto; c. 09/2017 Successful DCCV (second shock - 200J);   Marland Kitchen Personal history of radiation therapy 2001   left breast ca  . Psoriasis     Past Surgical History:  Procedure Laterality Date  . ABDOMINAL HYSTERECTOMY    . ANKLE FRACTURE SURGERY  4/08   left---hardware still in place  . 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 BREAST EXCISIONAL BIOPSY Left yrs ago   benign  . BREAST LUMPECTOMY Left 2001   f/u radiation  . CARDIOVERSION N/A 10/04/2017   Procedure: CARDIOVERSION;  Surgeon: Wellington Hampshire, MD;  Location: [facility] ORS;  Service: Cardiovascular;  Laterality: N/A;  . CARDIOVERSION N/A 12/16/2017   Procedure: CARDIOVERSION;  Surgeon: [name], MD;  Location: [facility] ORS;  Service: Cardiovascular;  Laterality: N/A;  .  CAROTID ENDARTERECTOMY Left 10/23/2004  . FRACTURE SURGERY    . OOPHORECTOMY    . SHOULDER SURGERY  6/07   left  . TONSILLECTOMY AND ADENOIDECTOMY    . TOTAL HIP ARTHROPLASTY  2004   right    There were no vitals filed for this visit.   Subjective Assessment - 01/11/20 1305    Subjective Pt reports shes feeling good and had no soreness or pain after last session.    Pertinent History Patient previously seen for balance difficulties, shoulder pain and the right, and low back pain.  Patient reports no falls in the past six months. Patient reports pain has not improved for the past month and has been about the same since the onset of pain.    Limitations Sitting;Standing;Walking    Patient Stated Goals To improve balance when walking, driving pain    Currently in Pain? Yes    Pain Onset More than a month ago           INTERVENTIONS    Therapeutic Exercise  - Sit to stands with use of TRX straps using gray chair  - 2x10  - Standing Hip Extension with UE support  x15 bilat. - Mini Lunges in standing with UE support - 1X15 - Leg swings frontal  and sagittal 1x15 bilat each  - Lateral step-up x5 bilat on 4inch step - Step-up x 5 bilat on 4inch step - Heel and Toe Raises with UE Support x20    SPC training for gait stability and righting  -SPC gait training for straight plane: 164ft, RUE SPC 2-point gait, SPC sequenced with LLE (Outside in parking lot and using sidewalk for gradient) -SPC gait training using steps/curb x 4 (curb outside)  Performed exercises to improve LE strength and stability       PT Education - 01/11/20 1306    Education Details form/technqiue with exercise    Person(s) Educated Patient    Methods Explanation;Demonstration    Comprehension Verbalized understanding;Returned demonstration            PT Short Term Goals - 10/26/19 1540      PT SHORT TERM GOAL #1   Title Patient will have a worst pain score of 6/10 to better be able to drive with less  overall pain    Baseline 8/10; 10/26/2019: 6/10    Time 3    Period Weeks    Status On-going    Target Date 10/09/19             PT Long Term Goals - 12/06/19 1043      PT LONG TERM GOAL #1   Title Patient will be independent with HEP to continue benefits of therapy after discharge.     Baseline Dependent with form and technique for exercises; 10/26/2019: cueing needed to complete HEP; 12/06/2019:  performing exercises at home    Time 6    Period Weeks    Status On-going      PT LONG TERM GOAL #2   Title Patient will improve 52mWT to over 1 m/s to indicate increased safety with ambulating in a community environment.    Baseline .66 m/s; 10/26/2019: .75 m/s ; 12/06/2019: . 8 m/s    Time 6    Period Weeks    Status On-going      PT LONG TERM GOAL #3   Title Patient will improve FGA to 63 to indicate significant functional improvement of R hip function.    Baseline FGA: 44; 10/26/2019: FGA: 48; 5/192/2019: 53    Time 6    Period Weeks    Status On-going      PT LONG TERM GOAL #4   Title Patient will be able to balance for 10sec with SLS to improve static balance and ability to get dressed when in standing    Baseline 2.5 sec B; 10/26/2019: 2.5 sec; 12/06/2019: 3 sec    Time 6    Period Weeks    Status On-going      PT LONG TERM GOAL #5   Title Patient will have a worst hip pain of a 3/10 over the past week to indicate functional improvement with transferring and driving    Baseline worst pain: 8/10; 10/26/2019: 6/10; 12/06/2019: 3/10    Time 6    Period Weeks    Status Achieved                 Plan - 01/11/20 1307    Clinical Impression Statement Continued working on strength and stability of LE to improve patients ambulation and confidence while out in community. Patient continues to improve in [facility] gait training and traversing curbs outside.  Will continue to work on LE strength and stability to return patient to PLOF.   Personal Factors and Comorbidities Age;Comorbidity  2     Comorbidities hip fx, RA    Examination-Activity Limitations Lift;Squat;Stand;Transfers    Examination-Participation Restrictions Driving    Stability/Clinical Decision Making Evolving/Moderate complexity    Clinical Decision Making Moderate    Rehab Potential Good    PT Frequency 2x / week    PT Duration 6 weeks    PT Treatment/Interventions Neuromuscular re-education;Stair training;Moist Heat;Cryotherapy;Iontophoresis 4mg /ml Dexamethasone;Electrical Stimulation;DME Instruction;Therapeutic activities;Therapeutic exercise;Balance training;Manual techniques;Patient/family education;Passive range of motion;Dry needling;Joint Manipulations    PT Next Visit Plan progress hip strengthening, use of SPC    PT Home Exercise Plan See education section    Consulted and Agree with Plan of Care Patient           Patient will benefit from skilled therapeutic intervention in order to improve the following deficits and impairments:  Abnormal gait, Decreased activity tolerance, Decreased endurance, Decreased range of motion, Decreased strength, Decreased balance, Difficulty walking, Decreased cognition, Pain, Decreased coordination, Increased muscle spasms, Postural dysfunction, Hypomobility  Visit Diagnosis: Unsteadiness on feet  History of falling  Pain in right hip     Problem List Patient Active Problem List   Diagnosis Date Noted  . Closed right hip fracture ([place]) 09/20/2019  . Gout 05/18/2019  . Clavi 03/09/2019  . Right hip pain 07/08/2018  . Chronic bilateral low back pain without sciatica 12/31/2017  . Atrial fibrillation ([place]) 09/10/2017  . Trapezius strain, right, initial encounter 09/10/2017  . Depression, major, single episode, mild ([place]) 09/10/2017  . Chronic venous insufficiency 09/09/2017  . Lymphedema 09/09/2017  . Pain of toe of left foot 08/17/2017  . Leg swelling 07/31/2017  . Dry eyes 07/06/2017  . Pain and swelling of lower extremity, right 03/29/2017  . Right  shoulder pain 03/29/2017  . Diarrhea 05/18/2016  . Fall 05/01/2016  . Leg weakness, bilateral 12/30/2015  . Macrocytic anemia 05/19/2013  . Psoriatic arthritis ([place]) 02/03/2013  . Right hand pain 08/05/2012  . Psoriasis   . Osteoarthritis, multiple sites   . History of breast cancer 10/28/2011  . GERD (gastroesophageal reflux disease)   . Hypertension   . Osteopenia   . Carotid stenosis 05/27/2011   1:50 PM, 01/11/20 Margarito Liner, SPT Student Physical Therapist [place]  [phone]  Margarito Liner 01/11/2020, 1:08 PM  [facility] [address] [place], [address] Phone: [phone]   Fax:  [phone]  Name: CALANI GICK MRN: [id number] Date of Birth: May 12, 1941

## 2020-01-16 ENCOUNTER — Other Ambulatory Visit: Payer: Self-pay

## 2020-01-16 ENCOUNTER — Ambulatory Visit: Payer: Medicare Other

## 2020-01-16 DIAGNOSIS — M25551 Pain in right hip: Secondary | ICD-10-CM | POA: Diagnosis not present

## 2020-01-16 DIAGNOSIS — M545 Low back pain: Secondary | ICD-10-CM | POA: Diagnosis not present

## 2020-01-16 DIAGNOSIS — M6281 Muscle weakness (generalized): Secondary | ICD-10-CM | POA: Diagnosis not present

## 2020-01-16 DIAGNOSIS — R2681 Unsteadiness on feet: Secondary | ICD-10-CM

## 2020-01-16 DIAGNOSIS — Z9181 History of falling: Secondary | ICD-10-CM

## 2020-01-16 DIAGNOSIS — M25511 Pain in right shoulder: Secondary | ICD-10-CM | POA: Diagnosis not present

## 2020-01-16 NOTE — Therapy (Signed)
[facility] [address] [place], [address] Phone: [phone]   Fax:  [phone]  Physical Therapy Treatment/Progress Note  Patient Details  Name: [name] MRN: [id number] Date of Birth: [birth year] Referring Provider (PT): Tamala Julian   Encounter Date: 01/16/2020   PT End of Session - 01/16/20 1305    Visit Number 30    Number of Visits 31    Date for PT Re-Evaluation 01/16/20    Authorization Type 6/10    PT Start Time 6962    PT Stop Time 1345    PT Time Calculation (min) 42 min    Equipment Utilized During Treatment Gait belt    Activity Tolerance Patient tolerated treatment well;No increased pain    Behavior During Therapy WFL for tasks assessed/performed           Past Medical History:  Diagnosis Date  . (HFpEF) heart failure with preserved ejection fraction ([place])    a. 08/2017 Echo: EF 55-60%, no rwma, mild MR, mildly dil LA, nl RV fxn.  . Breast cancer ([place]) 2001   left breast  . Cancer ([place]) 2001   left breast ca  . Carotid arterial disease ([place])    a. 10/2004 s/p L CEA; b. 12/2015 Carotid U/S: RICA 1-39%; b. LICA patent CEA site.  Marland Kitchen GERD (gastroesophageal reflux disease)   . Hyperlipidemia   . Hypertension   . Osteoarthritis, multiple sites   . Osteopenia   . Persistent atrial fibrillation ([place])    a. Dx 08/2017; b. CHA2DS2VASc = 6-->Xarelto; c. 09/2017 Successful DCCV (second shock - 200J);   Marland Kitchen Personal history of radiation therapy 2001   left breast ca  . Psoriasis     Past Surgical History:  Procedure Laterality Date  . ABDOMINAL HYSTERECTOMY    . ANKLE FRACTURE SURGERY  4/08   left---hardware still in place  . 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 BREAST EXCISIONAL BIOPSY Left yrs ago   benign  . BREAST LUMPECTOMY Left 2001   f/u radiation  . CARDIOVERSION N/A 10/04/2017   Procedure: CARDIOVERSION;  Surgeon: Wellington Hampshire, MD;  Location: [facility];   Service: Cardiovascular;  Laterality: N/A;  . CARDIOVERSION N/A 12/16/2017   Procedure: CARDIOVERSION;  Surgeon: [name], MD;  Location: [facility] OR[facility];  Service: Cardiovascular;  Laterality: N/A;  . CAROTID ENDARTERECTOMY Left 10/23/2004  . FRACTURE SURGERY    . OOPHORECTOMY    . SHOULDER SURGERY  6/07   left  . TONSILLECTOMY AND ADENOIDECTOMY    . TOTAL HIP ARTHROPLASTY  2004   right    There were no vitals filed for this visit.   Subjective Assessment - 01/16/20 1304    Subjective Pt reports everything is going well and has no other issues but her "normal" symptoms.   Pertinent History Patient previously seen for balance difficulties, shoulder pain and the right, and low back pain.  Patient reports no falls in the past six months. Patient reports pain has not improved for the past month and has been about the same since the onset of pain.    Limitations Sitting;Standing;Walking    Patient Stated Goals To improve balance when walking, driving pain    Pain Onset More than a month ago          INTERVENTIONS  Therapeutic Exercise  - Sit to stands with use of TRX straps using gray chair  - 2x10  - Standing Hip  Extension with UE support  x15 bilat. - Mini Lunges in standing with UE support - 1X15 - Leg swings frontal and sagittal 1x15 bilat each  - Step-up x 5 bilat on 8inch step - Heel and Toe Raises with UE Support x20     SPC training for gait stability and righting  -SPC gait training stepping over 4 hurdles forward and lateral step overs 2xdown/back (hurdles 4 ft apart) -SPC gait training zig zag through hurdles x2 down/back  -SPC gait training with vertical head movements 1x22ft -Spec gait training with horizontal head movements 1x47ft  -   Performed exercises to improve LE strength and stability      PT Education - 01/16/20 1305    Education Details form/technique with exercise    Person(s) Educated Patient    Methods Explanation;Demonstration    Comprehension  Verbalized understanding;Returned demonstration            PT Short Term Goals - 10/26/19 1540      PT SHORT TERM GOAL #1   Title Patient will have a worst pain score of 6/10 to better be able to drive with less overall pain    Baseline 8/10; 10/26/2019: 6/10    Time 3    Period Weeks    Status On-going    Target Date 10/09/19             PT Long Term Goals - 01/16/20 1307      PT LONG TERM GOAL #1   Title Patient will be independent with HEP to continue benefits of therapy after discharge.     Baseline Dependent with form and technique for exercises; 10/26/2019: cueing needed to complete HEP; 12/06/2019:  performing exercises at home    Time 6    Period Weeks      PT LONG TERM GOAL #2   Title Patient will improve 18mWT to over 1 m/s to indicate increased safety with ambulating in a community environment.    Baseline .66 m/s; 10/26/2019: .75 m/s ; 12/06/2019: . 8 m/s; 01/16/20: .91 m/s    Time 6    Period Weeks    Status On-going      PT LONG TERM GOAL #3   Title Patient will improve FGA to 63 to indicate significant functional improvement of R hip function.    Baseline FGA: 44; 10/26/2019: FGA: 48; 5/192/2019: 53;    Time 6    Period Weeks    Status Deferred      PT LONG TERM GOAL #4   Title Patient will be able to balance for 10sec with SLS to improve static balance and ability to get dressed when in standing    Baseline 2.5 sec B; 10/26/2019: 2.5 sec; 12/06/2019: 3 sec; 01/16/20: 4 sec    Time 6    Period Weeks    Status On-going      PT LONG TERM GOAL #5   Title Patient will have a worst hip pain of a 3/10 over the past week to indicate functional improvement with transferring and driving    Baseline worst pain: 8/10; 10/26/2019: 6/10; 12/06/2019: 3/10, 01/16/20: 0/10    Time 6    Period Weeks    Status Achieved      Additional Long Term Goals   Additional Long Term Goals Yes      PT LONG TERM GOAL #6   Title Patient will improve LEFS score by 12 points to show an  improvement in functional movements and mobility.  Baseline 01/16/20: 46    Time 6    Period Weeks    Status New    Target Date 02/27/20                 Plan - 01/16/20 1306    Clinical Impression Statement Patient has made improvements in increasing gait speed demonstrated by 23mWT speed;0.91 m/s. Patients pain has also decreased to 0/10NPS for her worst hip pain.  Patient states they continue to have some trouble with balance, strength, and endurance and would like to improve those in order to help her ability to ambulate out in the community without fear of falling.  Patient will continue to benefit from skilled PT in order to improve deficits to help return to PLOF.   Personal Factors and Comorbidities Age;Comorbidity 2    Comorbidities hip fx, RA    Examination-Activity Limitations Lift;Squat;Stand;Transfers    Stability/Clinical Decision Making Evolving/Moderate complexity    Clinical Decision Making Moderate    Rehab Potential Good    PT Frequency 2x / week    PT Duration 6 weeks    PT Treatment/Interventions Neuromuscular re-education;Stair training;Moist Heat;Cryotherapy;Iontophoresis 4mg /ml Dexamethasone;Electrical Stimulation;DME Instruction;Therapeutic activities;Therapeutic exercise;Balance training;Manual techniques;Patient/family education;Passive range of motion;Dry needling;Joint Manipulations    PT Next Visit Plan progress hip strengthening, use of SPC    PT Home Exercise Plan See education section    Consulted and Agree with Plan of Care Patient           Patient will benefit from skilled therapeutic intervention in order to improve the following deficits and impairments:  Abnormal gait, Decreased activity tolerance, Decreased endurance, Decreased range of motion, Decreased strength, Decreased balance, Difficulty walking, Decreased cognition, Pain, Decreased coordination, Increased muscle spasms, Postural dysfunction, Hypomobility  Visit  Diagnosis: Unsteadiness on feet  History of falling  Pain in right hip     Problem List Patient Active Problem List   Diagnosis Date Noted  . Closed right hip fracture ([place]) 09/20/2019  . Gout 05/18/2019  . Clavi 03/09/2019  . Right hip pain 07/08/2018  . Chronic bilateral low back pain without sciatica 12/31/2017  . Atrial fibrillation ([place]) 09/10/2017  . Trapezius strain, right, initial encounter 09/10/2017  . Depression, major, single episode, mild ([place]) 09/10/2017  . Chronic venous insufficiency 09/09/2017  . Lymphedema 09/09/2017  . Pain of toe of left foot 08/17/2017  . Leg swelling 07/31/2017  . Dry eyes 07/06/2017  . Pain and swelling of lower extremity, right 03/29/2017  . Right shoulder pain 03/29/2017  . Diarrhea 05/18/2016  . Fall 05/01/2016  . Leg weakness, bilateral 12/30/2015  . Macrocytic anemia 05/19/2013  . Psoriatic arthritis ([place]) 02/03/2013  . Right hand pain 08/05/2012  . Psoriasis   . Osteoarthritis, multiple sites   . History of breast cancer 10/28/2011  . GERD (gastroesophageal reflux disease)   . Hypertension   . Osteopenia   . Carotid stenosis 05/27/2011   1:52 PM, 01/16/20 Margarito Liner, SPT Student Physical Therapist [place]  [phone]  Margarito Liner 01/16/2020, 1:52 PM  [facility] [address], [place], [address] Phone: [phone]   Fax:  [phone]  Name: [name] MRN: [id number] Date of Birth: [birth year]

## 2020-01-17 NOTE — Addendum Note (Signed)
Addended by: Blain Pais on: 01/17/2020 09:53 AM   Modules accepted: Orders

## 2020-01-18 ENCOUNTER — Ambulatory Visit: Payer: Medicare Other | Admitting: Podiatry

## 2020-01-18 ENCOUNTER — Ambulatory Visit: Payer: Medicare Other

## 2020-01-23 ENCOUNTER — Ambulatory Visit: Payer: Medicare Other | Attending: Orthopedic Surgery

## 2020-01-23 ENCOUNTER — Encounter: Payer: Self-pay | Admitting: Family Medicine

## 2020-01-23 ENCOUNTER — Other Ambulatory Visit: Payer: Self-pay

## 2020-01-23 ENCOUNTER — Ambulatory Visit (INDEPENDENT_AMBULATORY_CARE_PROVIDER_SITE_OTHER): Payer: Medicare Other | Admitting: Family Medicine

## 2020-01-23 DIAGNOSIS — M545 Low back pain: Secondary | ICD-10-CM | POA: Diagnosis not present

## 2020-01-23 DIAGNOSIS — I6523 Occlusion and stenosis of bilateral carotid arteries: Secondary | ICD-10-CM | POA: Diagnosis not present

## 2020-01-23 DIAGNOSIS — M25551 Pain in right hip: Secondary | ICD-10-CM | POA: Insufficient documentation

## 2020-01-23 DIAGNOSIS — R2681 Unsteadiness on feet: Secondary | ICD-10-CM | POA: Diagnosis not present

## 2020-01-23 DIAGNOSIS — W19XXXD Unspecified fall, subsequent encounter: Secondary | ICD-10-CM

## 2020-01-23 DIAGNOSIS — Z9181 History of falling: Secondary | ICD-10-CM | POA: Diagnosis not present

## 2020-01-23 DIAGNOSIS — F419 Anxiety disorder, unspecified: Secondary | ICD-10-CM

## 2020-01-23 DIAGNOSIS — I10 Essential (primary) hypertension: Secondary | ICD-10-CM

## 2020-01-23 DIAGNOSIS — I4891 Unspecified atrial fibrillation: Secondary | ICD-10-CM | POA: Diagnosis not present

## 2020-01-23 DIAGNOSIS — M1A0491 Idiopathic chronic gout, unspecified hand, with tophus (tophi): Secondary | ICD-10-CM | POA: Diagnosis not present

## 2020-01-23 DIAGNOSIS — M6281 Muscle weakness (generalized): Secondary | ICD-10-CM | POA: Insufficient documentation

## 2020-01-23 NOTE — Assessment & Plan Note (Signed)
Sinus rhythm today. Possible rare symptoms. She will monitor for persistent symptoms. Discussed bleeding precautions.

## 2020-01-23 NOTE — Assessment & Plan Note (Signed)
Chronic. Stable. Continue rare use of ativan.

## 2020-01-23 NOTE — Assessment & Plan Note (Signed)
Doing well following her prior fall.

## 2020-01-23 NOTE — Therapy (Signed)
[facility] [address] [place], [address] Phone: [phone]   Fax:  [phone]  Physical Therapy Treatment  Patient Details  Name: [name] MRN: [id number] Date of Birth: 22-Nov-1940 Referring Provider (PT): Tamala Julian   Encounter Date: 01/23/2020   PT End of Session - 01/23/20 1025    Visit Number 31    Number of Visits 31    Date for PT Re-Evaluation 01/16/20    Authorization Type 7/10    PT Start Time 1017    PT Stop Time 1100    PT Time Calculation (min) 43 min    Equipment Utilized During Treatment Gait belt    Activity Tolerance Patient tolerated treatment well;No increased pain    Behavior During Therapy WFL for tasks assessed/performed           Past Medical History:  Diagnosis Date  . (HFpEF) heart failure with preserved ejection fraction ([place])    a. 08/2017 Echo: EF 55-60%, no rwma, mild MR, mildly dil LA, nl RV fxn.  . Breast cancer ([place]) 2001   left breast  . Cancer ([place]) 2001   left breast ca  . Carotid arterial disease ([place])    a. 10/2004 s/p L CEA; b. 12/2015 Carotid U/S: RICA 1-39%; b. LICA patent CEA site.  Marland Kitchen GERD (gastroesophageal reflux disease)   . Hyperlipidemia   . Hypertension   . Osteoarthritis, multiple sites   . Osteopenia   . Persistent atrial fibrillation ([place])    a. Dx 08/2017; b. CHA2DS2VASc = 6-->Xarelto; c. 09/2017 Successful DCCV (second shock - 200J);   Marland Kitchen Personal history of radiation therapy 2001   left breast ca  . Psoriasis     Past Surgical History:  Procedure Laterality Date  . ABDOMINAL HYSTERECTOMY    . ANKLE FRACTURE SURGERY  4/08   left---hardware still in place  . 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 BREAST EXCISIONAL BIOPSY Left yrs ago   benign  . BREAST LUMPECTOMY Left 2001   f/u radiation  . CARDIOVERSION N/A 10/04/2017   Procedure: CARDIOVERSION;  Surgeon: Wellington Hampshire, MD;  Location: [facility] ORS;  Service:  Cardiovascular;  Laterality: N/A;  . CARDIOVERSION N/A 12/16/2017   Procedure: CARDIOVERSION;  Surgeon: [name], MD;  Location: [facility] ORS;  Service: Cardiovascular;  Laterality: N/A;  . CAROTID ENDARTERECTOMY Left 10/23/2004  . FRACTURE SURGERY    . OOPHORECTOMY    . SHOULDER SURGERY  6/07   left  . TONSILLECTOMY AND ADENOIDECTOMY    . TOTAL HIP ARTHROPLASTY  2004   right    There were no vitals filed for this visit.   Subjective Assessment - 01/23/20 1024    Subjective Pt reports no issues at todays session.    Pertinent History Patient previously seen for balance difficulties, shoulder pain and the right, and low back pain.  Patient reports no falls in the past six months. Patient reports pain has not improved for the past month and has been about the same since the onset of pain.    Limitations Sitting;Standing;Walking    Patient Stated Goals To improve balance when walking, driving pain    Currently in Pain? Yes    Pain Onset More than a month ago             INTERVENTIONS   Therapeutic Exercise  - Sit to stands with use of TRX straps using gray chair  - 2x10  -  Standing Hip Extension with UE support  x15 bilat. - Mini Lunges in standing with UE support - 1X15 - Leg swings frontal and sagittal 1x15 bilat each  - Step-up x 5 bilat on 8inch step - Lateral step-up x5 bilat on 8inch step - Heel and Toe Raises with UE Support x20     SPC training for gait stability and righting  -SPC gait training using [place]  -SPC gait training 264ft  -Ladder with cues for one foot in each box x6 (cues to also look forward) -Ladder using Snake down and back x1 -Spec gait training with horizontal head movements 1x29ft    Performed exercises to improve LE strength and stability      PT Education - 01/23/20 1024    Education Details form/technique with exercise    Person(s) Educated Patient    Methods Explanation;Demonstration    Comprehension Verbalized  understanding;Returned demonstration            PT Short Term Goals - 10/26/19 1540      PT SHORT TERM GOAL #1   Title Patient will have a worst pain score of 6/10 to better be able to drive with less overall pain    Baseline 8/10; 10/26/2019: 6/10    Time 3    Period Weeks    Status On-going    Target Date 10/09/19             PT Long Term Goals - 01/16/20 1307      PT LONG TERM GOAL #1   Title Patient will be independent with HEP to continue benefits of therapy after discharge.     Baseline Dependent with form and technique for exercises; 10/26/2019: cueing needed to complete HEP; 12/06/2019:  performing exercises at home    Time 6    Period Weeks      PT LONG TERM GOAL #2   Title Patient will improve 46mWT to over 1 m/s to indicate increased safety with ambulating in a community environment.    Baseline .66 m/s; 10/26/2019: .75 m/s ; 12/06/2019: . 8 m/s; 01/16/20: .91 m/s    Time 6    Period Weeks    Status On-going      PT LONG TERM GOAL #3   Title Patient will improve FGA to 63 to indicate significant functional improvement of R hip function.    Baseline FGA: 44; 10/26/2019: FGA: 48; 5/192/2019: 53;    Time 6    Period Weeks    Status Deferred      PT LONG TERM GOAL #4   Title Patient will be able to balance for 10sec with SLS to improve static balance and ability to get dressed when in standing    Baseline 2.5 sec B; 10/26/2019: 2.5 sec; 12/06/2019: 3 sec; 01/16/20: 4 sec    Time 6    Period Weeks    Status On-going      PT LONG TERM GOAL #5   Title Patient will have a worst hip pain of a 3/10 over the past week to indicate functional improvement with transferring and driving    Baseline worst pain: 8/10; 10/26/2019: 6/10; 12/06/2019: 3/10, 01/16/20: 0/10    Time 6    Period Weeks    Status Achieved      Additional Long Term Goals   Additional Long Term Goals Yes      PT LONG TERM GOAL #6   Title Patient will improve LEFS score by 12 points to show an improvement  in  functional movements and mobility.    Baseline 46    Time 6    Period Weeks    Status New    Target Date 02/27/20                 Plan - 01/23/20 1026    Clinical Impression Statement Continued focusing on strength and gait at todays session.  Patient is progressing well with SPC gait training and tolerated surfaces changes well today.  Patient noted better confidence while descending a softer terrain with use of SPC.  Patient will benefit from skilled PT to progress towards goals and return to PLOF.   Personal Factors and Comorbidities Age;Comorbidity 2    Comorbidities hip fx, RA    Examination-Activity Limitations Lift;Squat;Stand;Transfers    Stability/Clinical Decision Making Evolving/Moderate complexity    Rehab Potential Good    PT Frequency 2x / week    PT Duration 6 weeks    PT Treatment/Interventions Neuromuscular re-education;Stair training;Moist Heat;Cryotherapy;Iontophoresis 4mg /ml Dexamethasone;Electrical Stimulation;DME Instruction;Therapeutic activities;Therapeutic exercise;Balance training;Manual techniques;Patient/family education;Passive range of motion;Dry needling;Joint Manipulations    PT Next Visit Plan progress hip strengthening, use of SPC    PT Home Exercise Plan See education section    Consulted and Agree with Plan of Care Patient           Patient will benefit from skilled therapeutic intervention in order to improve the following deficits and impairments:  Abnormal gait, Decreased activity tolerance, Decreased endurance, Decreased range of motion, Decreased strength, Decreased balance, Difficulty walking, Decreased cognition, Pain, Decreased coordination, Increased muscle spasms, Postural dysfunction, Hypomobility  Visit Diagnosis: Unsteadiness on feet  History of falling  Pain in right hip  Muscle weakness (generalized)     Problem List Patient Active Problem List   Diagnosis Date Noted  . Anxiety 01/23/2020  . Closed right hip  fracture ([place]) 09/20/2019  . Gout 05/18/2019  . Clavi 03/09/2019  . Right hip pain 07/08/2018  . Chronic bilateral low back pain without sciatica 12/31/2017  . Atrial fibrillation ([place]) 09/10/2017  . Trapezius strain, right, initial encounter 09/10/2017  . Depression, major, single episode, mild ([place]) 09/10/2017  . Chronic venous insufficiency 09/09/2017  . Lymphedema 09/09/2017  . Pain of toe of left foot 08/17/2017  . Leg swelling 07/31/2017  . Dry eyes 07/06/2017  . Pain and swelling of lower extremity, right 03/29/2017  . Right shoulder pain 03/29/2017  . Diarrhea 05/18/2016  . Fall 05/01/2016  . Leg weakness, bilateral 12/30/2015  . Macrocytic anemia 05/19/2013  . Psoriatic arthritis ([place]) 02/03/2013  . Right hand pain 08/05/2012  . Psoriasis   . Osteoarthritis, multiple sites   . History of breast cancer 10/28/2011  . GERD (gastroesophageal reflux disease)   . Hypertension   . Osteopenia   . Carotid stenosis 05/27/2011   11:02 AM, 01/23/20 Margarito Liner, SPT Student Physical Therapist [place]  [phone]  Margarito Liner 01/23/2020, 10:26 AM  [facility] [address] [place], [address] Phone: [phone]   Fax:  [phone]  Name: JANESHIA CILIBERTO MRN: [id number] Date of Birth: Sep 23, 1940

## 2020-01-23 NOTE — Patient Instructions (Signed)
Nice to see you.  Please continue the allopurinol at least until you see Dr Amil Amen.  We will have you return for a BP check with nursing to compare your BP monitor. Please bring it with you to that visit.

## 2020-01-23 NOTE — Assessment & Plan Note (Signed)
Normal here today though elevated at home. We will have her return for a BP check with nursing and see if her cuff is accurate. She will continue her metoprolol.

## 2020-01-23 NOTE — Assessment & Plan Note (Signed)
Encouraged to stay on allopurinol. Discussed biopsy proven gouty tophi. She will see rheumatology as planned.

## 2020-01-23 NOTE — Progress Notes (Signed)
  [name], MD Phone: [phone]  [name] is a 79 y.o. female who presents today for f/u.  HYPERTENSION/AFIB Disease Monitoring  Home BP Monitoring 137-175/74-96 Chest pain- no    Dyspnea- no Medications  Compliance-  Taking flecanide, pradaxa, metoprolol.  Edema- no Rare palpitations with exertion. No bleeding issues.   Anxiety: rarely takes ativan at night if her mind starts going/ no drowsiness with this. No depression.  Fall: occurred in November. Broke her pelvis. She has recovered well from this.   Gout/chronic joint pain: ongoing issues with this. She does not feel as though though the allopurinol has helped with her pain. She has biopsy proven gouty tophi. She is going to see a new rheumatologist in August.     Social History   Tobacco Use  Smoking Status Former Smoker  . Packs/day: 1.00  . Years: 40.00  . Pack years: 40.00  . Types: Cigarettes  . Quit date: 07/21/1995  . Years since quitting: 24.5  Smokeless Tobacco Never Used     ROS see history of present illness  Objective  Physical Exam Vitals:   01/23/20 0908  BP: 125/78  Pulse: 70  Temp: (!) 97.4 F (36.3 C)  SpO2: 99%    BP Readings from Last 3 Encounters:  01/23/20 125/78  12/14/19 (!) 170/100  10/10/19 (!) 179/83   Wt Readings from Last 3 Encounters:  01/23/20 116 lb 12.8 oz (53 kg)  12/14/19 118 lb 2 oz (53.6 kg)  10/10/19 114 lb 11.2 oz (52 kg)    Physical Exam Constitutional:      General: She is not in acute distress.    Appearance: She is not diaphoretic.  Cardiovascular:     Rate and Rhythm: Normal rate and regular rhythm.     Heart sounds: Normal heart sounds.  Pulmonary:     Effort: Pulmonary effort is normal.     Breath sounds: Normal breath sounds.  Musculoskeletal:     Comments: PIP and DIP joint enlargement in bilateral hands, tophi noted in several finger tips.  Skin:    General: Skin is warm and dry.  Neurological:     Mental Status: She is  alert.      Assessment/Plan: Please see individual problem list.  Hypertension Normal here today though elevated at home. We will have her return for a BP check with nursing and see if her cuff is accurate. She will continue her metoprolol.  Atrial fibrillation (HCC) Sinus rhythm today. Possible rare symptoms. She will monitor for persistent symptoms. Discussed bleeding precautions.   Gout Encouraged to stay on allopurinol. Discussed biopsy proven gouty tophi. She will see rheumatology as planned.   Fall Doing well following her prior fall.   Anxiety Chronic. Stable. Continue rare use of ativan.     No orders of the defined types were placed in this encounter.   No orders of the defined types were placed in this encounter.   This visit occurred during the SARS-CoV-2 public health emergency.  Safety protocols were in place, including screening questions prior to the visit, additional usage of staff PPE, and extensive cleaning of exam room while observing appropriate contact time as indicated for disinfecting solutions.    [name], MD [place]

## 2020-01-25 ENCOUNTER — Other Ambulatory Visit: Payer: Self-pay

## 2020-01-25 ENCOUNTER — Ambulatory Visit (INDEPENDENT_AMBULATORY_CARE_PROVIDER_SITE_OTHER): Payer: Medicare Other | Admitting: Podiatry

## 2020-01-25 ENCOUNTER — Ambulatory Visit: Payer: Medicare Other

## 2020-01-25 ENCOUNTER — Ambulatory Visit (INDEPENDENT_AMBULATORY_CARE_PROVIDER_SITE_OTHER): Payer: Medicare Other

## 2020-01-25 ENCOUNTER — Encounter: Payer: Self-pay | Admitting: Podiatry

## 2020-01-25 VITALS — BP 86/56 | HR 62

## 2020-01-25 DIAGNOSIS — I10 Essential (primary) hypertension: Secondary | ICD-10-CM

## 2020-01-25 DIAGNOSIS — I872 Venous insufficiency (chronic) (peripheral): Secondary | ICD-10-CM

## 2020-01-25 DIAGNOSIS — Z9181 History of falling: Secondary | ICD-10-CM | POA: Diagnosis not present

## 2020-01-25 DIAGNOSIS — M545 Low back pain, unspecified: Secondary | ICD-10-CM

## 2020-01-25 DIAGNOSIS — R2681 Unsteadiness on feet: Secondary | ICD-10-CM | POA: Diagnosis not present

## 2020-01-25 DIAGNOSIS — M6281 Muscle weakness (generalized): Secondary | ICD-10-CM | POA: Diagnosis not present

## 2020-01-25 DIAGNOSIS — M25551 Pain in right hip: Secondary | ICD-10-CM | POA: Diagnosis not present

## 2020-01-25 DIAGNOSIS — B351 Tinea unguium: Secondary | ICD-10-CM | POA: Diagnosis not present

## 2020-01-25 DIAGNOSIS — M79676 Pain in unspecified toe(s): Secondary | ICD-10-CM

## 2020-01-25 NOTE — Therapy (Signed)
[facility] [address] [place], [address] Phone: [phone]   Fax:  [phone]  Physical Therapy Treatment  Patient Details  Name: [name] MRN: [id number] Date of Birth: 02/14/1941 Referring Provider (PT): Tamala Julian   Encounter Date: 01/25/2020   PT End of Session - 01/25/20 1022    Visit Number 32    Number of Visits 40    Date for PT Re-Evaluation 02/13/20    Authorization Type 8/10    PT Start Time 9528    PT Stop Time 1100    PT Time Calculation (min) 45 min    Equipment Utilized During Treatment Gait belt    Activity Tolerance Patient tolerated treatment well;No increased pain    Behavior During Therapy WFL for tasks assessed/performed           Past Medical History:  Diagnosis Date  . (HFpEF) heart failure with preserved ejection fraction ([place])    a. 08/2017 Echo: EF 55-60%, no rwma, mild MR, mildly dil LA, nl RV fxn.  . Breast cancer ([place]) 2001   left breast  . Cancer ([place]) 2001   left breast ca  . Carotid arterial disease ([place])    a. 10/2004 s/p L CEA; b. 12/2015 Carotid U/S: RICA 1-39%; b. LICA patent CEA site.  Marland Kitchen GERD (gastroesophageal reflux disease)   . Hyperlipidemia   . Hypertension   . Osteoarthritis, multiple sites   . Osteopenia   . Persistent atrial fibrillation ([place])    a. Dx 08/2017; b. CHA2DS2VASc = 6-->Xarelto; c. 09/2017 Successful DCCV (second shock - 200J);   Marland Kitchen Personal history of radiation therapy 2001   left breast ca  . Psoriasis     Past Surgical History:  Procedure Laterality Date  . ABDOMINAL HYSTERECTOMY    . ANKLE FRACTURE SURGERY  4/08   left---hardware still in place  . 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 BREAST EXCISIONAL BIOPSY Left yrs ago   benign  . BREAST LUMPECTOMY Left 2001   f/u radiation  . CARDIOVERSION N/A 10/04/2017   Procedure: CARDIOVERSION;  Surgeon: Wellington Hampshire, MD;  Location: [facility] ORS;  Service:  Cardiovascular;  Laterality: N/A;  . CARDIOVERSION N/A 12/16/2017   Procedure: CARDIOVERSION;  Surgeon: [name], MD;  Location: [facility] ORS;  Service: Cardiovascular;  Laterality: N/A;  . CAROTID ENDARTERECTOMY Left 10/23/2004  . FRACTURE SURGERY    . OOPHORECTOMY    . SHOULDER SURGERY  6/07   left  . TONSILLECTOMY AND ADENOIDECTOMY    . TOTAL HIP ARTHROPLASTY  2004   right    There were no vitals filed for this visit.   Subjective Assessment - 01/25/20 1019    Subjective Patient reports she has no soreness at todays session.    Pertinent History Patient previously seen for balance difficulties, shoulder pain and the right, and low back pain.  Patient reports no falls in the past six months. Patient reports pain has not improved for the past month and has been about the same since the onset of pain.    Limitations Sitting;Standing;Walking    Patient Stated Goals To improve balance when walking, driving pain    Currently in Pain? Yes    Pain Onset More than a month ago          INTERVENTIONS   Therapeutic Exercise  - Sit to stands with use of TRX straps using gray chair  - 2x10  -  Standing Hip Extension with UE support x15 bilat. 2lb ankle weight  - Standing Hip Abduction with UE support x10 bilat 2lb ankle weight  - Mini Lunges in standing with UE support - 1X15 - Leg swings frontal and sagittal 1x15 bilat each  - Step-up x 5 bilat on 8inch step - Lateral step-up x5 bilat on 8inch step - Heel and Toe Raises with UE Support x20   Performed exercises to improve LE strength and stability    SPC training for gait stability and righting  - SPC obstacle training with cones and box for step overs x 2 -SPC stair training with step through pattern x4 ascending/descending        PT Education - 01/25/20 1021    Education Details form/technique with exercise    Person(s) Educated Patient    Methods Explanation;Demonstration    Comprehension Verbalized understanding;Returned  demonstration            PT Short Term Goals - 10/26/19 1540      PT SHORT TERM GOAL #1   Title Patient will have a worst pain score of 6/10 to better be able to drive with less overall pain    Baseline 8/10; 10/26/2019: 6/10    Time 3    Period Weeks    Status On-going    Target Date 10/09/19             PT Long Term Goals - 01/16/20 1307      PT LONG TERM GOAL #1   Title Patient will be independent with HEP to continue benefits of therapy after discharge.     Baseline Dependent with form and technique for exercises; 10/26/2019: cueing needed to complete HEP; 12/06/2019:  performing exercises at home    Time 6    Period Weeks      PT LONG TERM GOAL #2   Title Patient will improve 30mWT to over 1 m/s to indicate increased safety with ambulating in a community environment.    Baseline .66 m/s; 10/26/2019: .75 m/s ; 12/06/2019: . 8 m/s; 01/16/20: .91 m/s    Time 6    Period Weeks    Status On-going      PT LONG TERM GOAL #3   Title Patient will improve FGA to 63 to indicate significant functional improvement of R hip function.    Baseline FGA: 44; 10/26/2019: FGA: 48; 5/192/2019: 53;    Time 6    Period Weeks    Status Deferred      PT LONG TERM GOAL #4   Title Patient will be able to balance for 10sec with SLS to improve static balance and ability to get dressed when in standing    Baseline 2.5 sec B; 10/26/2019: 2.5 sec; 12/06/2019: 3 sec; 01/16/20: 4 sec    Time 6    Period Weeks    Status On-going      PT LONG TERM GOAL #5   Title Patient will have a worst hip pain of a 3/10 over the past week to indicate functional improvement with transferring and driving    Baseline worst pain: 8/10; 10/26/2019: 6/10; 12/06/2019: 3/10, 01/16/20: 0/10    Time 6    Period Weeks    Status Achieved      Additional Long Term Goals   Additional Long Term Goals Yes      PT LONG TERM GOAL #6   Title Patient will improve LEFS score by 12 points to show an improvement in functional movements  and mobility.    Baseline 46    Time 6    Period Weeks    Status New    Target Date 02/27/20                 Plan - 01/25/20 1022    Clinical Impression Statement Continued working on LE strengthening and SPC gait training at todays session. Patient is showing improvements in strength, gait, and balance; deficits remain, such as poor excursion with hip flexion during gait cycle. Patient will continue to benefit from skilled physical therapy to progress towards goals and return to PLOF.   Personal Factors and Comorbidities Age;Comorbidity 2    Comorbidities hip fx, RA    Examination-Activity Limitations Lift;Squat;Stand;Transfers    Stability/Clinical Decision Making Evolving/Moderate complexity    Clinical Decision Making Moderate    Rehab Potential Good    PT Frequency 2x / week    PT Duration 6 weeks    PT Treatment/Interventions Neuromuscular re-education;Stair training;Moist Heat;Cryotherapy;Iontophoresis 4mg /ml Dexamethasone;Electrical Stimulation;DME Instruction;Therapeutic activities;Therapeutic exercise;Balance training;Manual techniques;Patient/family education;Passive range of motion;Dry needling;Joint Manipulations    PT Next Visit Plan progress hip strengthening, use of SPC    PT Home Exercise Plan See education section    Consulted and Agree with Plan of Care Patient           Patient will benefit from skilled therapeutic intervention in order to improve the following deficits and impairments:  Abnormal gait, Decreased activity tolerance, Decreased endurance, Decreased range of motion, Decreased strength, Decreased balance, Difficulty walking, Decreased cognition, Pain, Decreased coordination, Increased muscle spasms, Postural dysfunction, Hypomobility  Visit Diagnosis: Unsteadiness on feet  History of falling  Pain in right hip  Muscle weakness (generalized)  Acute bilateral low back pain without sciatica     Problem List Patient Active Problem List    Diagnosis Date Noted  . Anxiety 01/23/2020  . Closed right hip fracture ([place]) 09/20/2019  . Gout 05/18/2019  . Clavi 03/09/2019  . Right hip pain 07/08/2018  . Chronic bilateral low back pain without sciatica 12/31/2017  . Atrial fibrillation ([place]) 09/10/2017  . Trapezius strain, right, initial encounter 09/10/2017  . Depression, major, single episode, mild ([place]) 09/10/2017  . Chronic venous insufficiency 09/09/2017  . Lymphedema 09/09/2017  . Pain of toe of left foot 08/17/2017  . Leg swelling 07/31/2017  . Dry eyes 07/06/2017  . Pain and swelling of lower extremity, right 03/29/2017  . Right shoulder pain 03/29/2017  . Diarrhea 05/18/2016  . Fall 05/01/2016  . Leg weakness, bilateral 12/30/2015  . Macrocytic anemia 05/19/2013  . Psoriatic arthritis ([place]) 02/03/2013  . Right hand pain 08/05/2012  . Psoriasis   . Osteoarthritis, multiple sites   . History of breast cancer 10/28/2011  . GERD (gastroesophageal reflux disease)   . Hypertension   . Osteopenia   . Carotid stenosis 05/27/2011   11:50 AM, 01/25/20 Margarito Liner, SPT Student Physical Therapist [place]  [phone]  Margarito Liner 01/25/2020, 10:23 AM  [facility] [address], [place], [address] Phone: [phone]   Fax:  [phone]  Name: TZIREL LEONOR MRN: [id number] Date of Birth: 10-11-1940

## 2020-01-25 NOTE — Progress Notes (Signed)
This patient returns to my office for at risk foot care.  This patient requires this care by a professional since this patient will be at risk due to having lymphedema.  This patient is unable to cut nails herself since the patient cannot reach her nails.These nails are painful walking and wearing shoes.  This patient presents for at risk foot care today.  General Appearance  Alert, conversant and in no acute stress.  Vascular  Dorsalis pedis and posterior tibial  pulses are palpable  bilaterally.  Capillary return is within normal limits  bilaterally. Temperature is within normal limits  bilaterally.  Neurologic  Senn-Weinstein monofilament wire test within normal limits  bilaterally. Muscle power within normal limits bilaterally.  Nails Thick disfigured discolored nails with subungual debris  from hallux to fifth toes bilaterally. No evidence of bacterial infection or drainage bilaterally.  Orthopedic  No limitations of motion  feet .  No crepitus or effusions noted.  No bony pathology or digital deformities noted.  HAV  B/L.  Mallet toe third toe left foot.  Arthritic history of gout.  Skin  normotropic skin with no porokeratosis noted bilaterally.  No signs of infections or ulcers noted.     Onychomycosis  Pain in right toes  Pain in left toes  Consent was obtained for treatment procedures.   Mechanical debridement of nails 1-5  bilaterally performed with a nail nipper.  Filed with dremel without incident.  Gave her information for purchasing crest pad left foot.   Return office visit    3 months                  Told patient to return for periodic foot care and evaluation due to potential at risk complications.   Gardiner Barefoot DPM

## 2020-01-25 NOTE — Progress Notes (Signed)
Patient is here for a BP check due to bp being high at home, as per patient.  Currently patients BP is 86/56 and BPM is 62.  Patient has no complaints of headaches, blurry vision, chest pain, arm pain, light headedness, dizziness, and nor jaw pain. Please see previous note for order.

## 2020-01-26 NOTE — Progress Notes (Signed)
Yes she did bring cuff in . It is not accurate at all. 63+ on the systolic and diastolic was accurate.

## 2020-01-29 NOTE — Progress Notes (Signed)
Noted. I would suggest having her get a new BP monitor and check at home and follow-up with me in 3 weeks for recheck.

## 2020-02-06 ENCOUNTER — Ambulatory Visit: Payer: Medicare Other

## 2020-02-06 ENCOUNTER — Other Ambulatory Visit: Payer: Self-pay

## 2020-02-06 DIAGNOSIS — R2681 Unsteadiness on feet: Secondary | ICD-10-CM | POA: Diagnosis not present

## 2020-02-06 DIAGNOSIS — M545 Low back pain, unspecified: Secondary | ICD-10-CM

## 2020-02-06 DIAGNOSIS — Z9181 History of falling: Secondary | ICD-10-CM

## 2020-02-06 DIAGNOSIS — M25551 Pain in right hip: Secondary | ICD-10-CM | POA: Diagnosis not present

## 2020-02-06 DIAGNOSIS — M6281 Muscle weakness (generalized): Secondary | ICD-10-CM

## 2020-02-06 NOTE — Therapy (Signed)
[facility] [address] [place], [address] Phone: [phone]   Fax:  [phone]  Physical Therapy Treatment  Patient Details  Name: [name] MRN: [id number] Date of Birth: January 30, 1941 Referring Provider (PT): Tamala Julian   Encounter Date: 02/06/2020   PT End of Session - 02/06/20 1306    Visit Number 33    Number of Visits 40    Date for PT Re-Evaluation 02/13/20    Authorization Type 8/10    PT Start Time 1300    PT Stop Time 1345    PT Time Calculation (min) 45 min    Equipment Utilized During Treatment Gait belt    Activity Tolerance Patient tolerated treatment well;No increased pain    Behavior During Therapy WFL for tasks assessed/performed           Past Medical History:  Diagnosis Date  . (HFpEF) heart failure with preserved ejection fraction ([place])    a. 08/2017 Echo: EF 55-60%, no rwma, mild MR, mildly dil LA, nl RV fxn.  . Breast cancer ([place]) 2001   left breast  . Cancer ([place]) 2001   left breast ca  . Carotid arterial disease ([place])    a. 10/2004 s/p L CEA; b. 12/2015 Carotid U/S: RICA 1-39%; b. LICA patent CEA site.  Marland Kitchen GERD (gastroesophageal reflux disease)   . Hyperlipidemia   . Hypertension   . Osteoarthritis, multiple sites   . Osteopenia   . Persistent atrial fibrillation ([place])    a. Dx 08/2017; b. CHA2DS2VASc = 6-->Xarelto; c. 09/2017 Successful DCCV (second shock - 200J);   Marland Kitchen Personal history of radiation therapy 2001   left breast ca  . Psoriasis     Past Surgical History:  Procedure Laterality Date  . ABDOMINAL HYSTERECTOMY    . ANKLE FRACTURE SURGERY  4/08   left---hardware still in place  . 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 BREAST EXCISIONAL BIOPSY Left yrs ago   benign  . BREAST LUMPECTOMY Left 2001   f/u radiation  . CARDIOVERSION N/A 10/04/2017   Procedure: CARDIOVERSION;  Surgeon: Wellington Hampshire, MD;  Location: [facility] ORS;  Service:  Cardiovascular;  Laterality: N/A;  . CARDIOVERSION N/A 12/16/2017   Procedure: CARDIOVERSION;  Surgeon: [name], MD;  Location: [facility] ORS;  Service: Cardiovascular;  Laterality: N/A;  . CAROTID ENDARTERECTOMY Left 10/23/2004  . FRACTURE SURGERY    . OOPHORECTOMY    . SHOULDER SURGERY  6/07   left  . TONSILLECTOMY AND ADENOIDECTOMY    . TOTAL HIP ARTHROPLASTY  2004   right    There were no vitals filed for this visit.   Subjective Assessment - 02/06/20 1305    Subjective Patient reports no issues at todays session.    Pertinent History Patient previously seen for balance difficulties, shoulder pain and the right, and low back pain.  Patient reports no falls in the past six months. Patient reports pain has not improved for the past month and has been about the same since the onset of pain.    Limitations Sitting;Standing;Walking    Patient Stated Goals To improve balance when walking, driving pain    Currently in Pain? Yes    Pain Score 3     Pain Onset More than a month ago            INTERVENTIONS    Therapeutic Exercise  - Sit to stands with use of TRX straps  using gray chair  - 2x10  - Standing Hip Extension with UE support x15 bilat. 2lb ankle weight  - Standing Hip Abduction with UE support x10 bilat 2lb ankle weight  - Mini Lunges in standing with UE support - 1X15 - Leg swings frontal and sagittal 1x15 bilat each  - Step-up x8 bilat on 8inch step - Lateral step-up x8 bilat on 8inch step - Heel and Toe Raises with UE Support x20   Performed exercises to improve LE strength, stability, and balance    SPC training for gait stability and righting  -SPC hurdle training for step overs x 2 (L and R foot first) -SPC hurdle figure 8's x4  -SPC with 8inch clearance x5 per foot -SPC 11.5inch step over x4 per foot    PT Education - 02/06/20 1306    Education Details form/technique with exercise    Person(s) Educated Patient    Methods Explanation;Demonstration     Comprehension Verbalized understanding;Returned demonstration            PT Short Term Goals - 10/26/19 1540      PT SHORT TERM GOAL #1   Title Patient will have a worst pain score of 6/10 to better be able to drive with less overall pain    Baseline 8/10; 10/26/2019: 6/10    Time 3    Period Weeks    Status On-going    Target Date 10/09/19             PT Long Term Goals - 01/16/20 1307      PT LONG TERM GOAL #1   Title Patient will be independent with HEP to continue benefits of therapy after discharge.     Baseline Dependent with form and technique for exercises; 10/26/2019: cueing needed to complete HEP; 12/06/2019:  performing exercises at home    Time 6    Period Weeks      PT LONG TERM GOAL #2   Title Patient will improve 32mWT to over 1 m/s to indicate increased safety with ambulating in a community environment.    Baseline .66 m/s; 10/26/2019: .75 m/s ; 12/06/2019: . 8 m/s; 01/16/20: .91 m/s    Time 6    Period Weeks    Status On-going      PT LONG TERM GOAL #3   Title Patient will improve FGA to 63 to indicate significant functional improvement of R hip function.    Baseline FGA: 44; 10/26/2019: FGA: 48; 5/192/2019: 53;    Time 6    Period Weeks    Status Deferred      PT LONG TERM GOAL #4   Title Patient will be able to balance for 10sec with SLS to improve static balance and ability to get dressed when in standing    Baseline 2.5 sec B; 10/26/2019: 2.5 sec; 12/06/2019: 3 sec; 01/16/20: 4 sec    Time 6    Period Weeks    Status On-going      PT LONG TERM GOAL #5   Title Patient will have a worst hip pain of a 3/10 over the past week to indicate functional improvement with transferring and driving    Baseline worst pain: 8/10; 10/26/2019: 6/10; 12/06/2019: 3/10, 01/16/20: 0/10    Time 6    Period Weeks    Status Achieved      Additional Long Term Goals   Additional Long Term Goals Yes      PT LONG TERM GOAL #6   Title Patient  will improve LEFS score by 12 points  to show an improvement in functional movements and mobility.    Baseline 46    Time 6    Period Weeks    Status New    Target Date 02/27/20                 Plan - 02/06/20 1307    Clinical Impression Statement Focused on LE strength, gait training, and dynamic balance during todays session. Patient tolerated exercises well and confidence is improving when performing tasks that require more static/dynamic balance along with strength in SLS.  Patient will continue to benefit from skilled therapy to return to prior level of function.    Personal Factors and Comorbidities Age;Comorbidity 2    Comorbidities hip fx, RA    Examination-Activity Limitations Lift;Squat;Stand;Transfers    Stability/Clinical Decision Making Evolving/Moderate complexity    Clinical Decision Making Moderate    Rehab Potential Good    PT Frequency 2x / week    PT Duration 6 weeks    PT Treatment/Interventions Neuromuscular re-education;Stair training;Moist Heat;Cryotherapy;Iontophoresis 4mg /ml Dexamethasone;Electrical Stimulation;DME Instruction;Therapeutic activities;Therapeutic exercise;Balance training;Manual techniques;Patient/family education;Passive range of motion;Dry needling;Joint Manipulations    PT Next Visit Plan progress hip strengthening, use of SPC    PT Home Exercise Plan See education section    Consulted and Agree with Plan of Care Patient           Patient will benefit from skilled therapeutic intervention in order to improve the following deficits and impairments:  Abnormal gait, Decreased activity tolerance, Decreased endurance, Decreased range of motion, Decreased strength, Decreased balance, Difficulty walking, Decreased cognition, Pain, Decreased coordination, Increased muscle spasms, Postural dysfunction, Hypomobility  Visit Diagnosis: Unsteadiness on feet  History of falling  Pain in right hip  Muscle weakness (generalized)  Acute bilateral low back pain without  sciatica     Problem List Patient Active Problem List   Diagnosis Date Noted  . Anxiety 01/23/2020  . Closed right hip fracture ([place]) 09/20/2019  . Gout 05/18/2019  . Clavi 03/09/2019  . Right hip pain 07/08/2018  . Chronic bilateral low back pain without sciatica 12/31/2017  . Atrial fibrillation ([place]) 09/10/2017  . Trapezius strain, right, initial encounter 09/10/2017  . Depression, major, single episode, mild ([place]) 09/10/2017  . Chronic venous insufficiency 09/09/2017  . Lymphedema 09/09/2017  . Pain of toe of left foot 08/17/2017  . Leg swelling 07/31/2017  . Dry eyes 07/06/2017  . Pain and swelling of lower extremity, right 03/29/2017  . Right shoulder pain 03/29/2017  . Diarrhea 05/18/2016  . Fall 05/01/2016  . Leg weakness, bilateral 12/30/2015  . Macrocytic anemia 05/19/2013  . Psoriatic arthritis ([place]) 02/03/2013  . Right hand pain 08/05/2012  . Psoriasis   . Osteoarthritis, multiple sites   . History of breast cancer 10/28/2011  . GERD (gastroesophageal reflux disease)   . Hypertension   . Osteopenia   . Carotid stenosis 05/27/2011   1:48 PM, 02/06/20 Margarito Liner, SPT Student Physical Therapist [place]  [phone]  Margarito Liner 02/06/2020, 1:47 PM  [facility] [address], [place], [address] Phone: [phone]   Fax:  [phone]  Name: [name] MRN: [id number] Date of Birth: [birth year]

## 2020-02-07 ENCOUNTER — Other Ambulatory Visit: Payer: Self-pay | Admitting: Physician Assistant

## 2020-02-07 NOTE — Telephone Encounter (Signed)
This a [facility] pt

## 2020-02-07 NOTE — Telephone Encounter (Signed)
Pt's age 79, wt 81 kg, SCr 0.9, CrCl 41.71, last ov w/ SK 12/14/19. Per Karren Cobble, RPh, ok to refill.

## 2020-02-08 ENCOUNTER — Ambulatory Visit (INDEPENDENT_AMBULATORY_CARE_PROVIDER_SITE_OTHER): Payer: Medicare Other

## 2020-02-08 ENCOUNTER — Telehealth: Payer: Self-pay

## 2020-02-08 ENCOUNTER — Ambulatory Visit
Admission: RE | Admit: 2020-02-08 | Discharge: 2020-02-08 | Disposition: A | Discharge status: Home / Self Care | Payer: Medicare Other | Source: Ambulatory Visit | Attending: Family Medicine | Admitting: Family Medicine

## 2020-02-08 ENCOUNTER — Other Ambulatory Visit: Payer: Self-pay

## 2020-02-08 ENCOUNTER — Ambulatory Visit: Payer: Medicare Other

## 2020-02-08 VITALS — BP 192/99 | HR 62 | Ht 66.0 in | Wt 116.0 lb

## 2020-02-08 DIAGNOSIS — M25551 Pain in right hip: Secondary | ICD-10-CM

## 2020-02-08 DIAGNOSIS — M545 Low back pain, unspecified: Secondary | ICD-10-CM

## 2020-02-08 DIAGNOSIS — M6281 Muscle weakness (generalized): Secondary | ICD-10-CM | POA: Diagnosis not present

## 2020-02-08 DIAGNOSIS — Z9181 History of falling: Secondary | ICD-10-CM

## 2020-02-08 DIAGNOSIS — R2681 Unsteadiness on feet: Secondary | ICD-10-CM | POA: Diagnosis not present

## 2020-02-08 DIAGNOSIS — Z0000 Encounter for general adult medical examination without abnormal findings: Secondary | ICD-10-CM | POA: Diagnosis not present

## 2020-02-08 DIAGNOSIS — Z1231 Encounter for screening mammogram for malignant neoplasm of breast: Secondary | ICD-10-CM | POA: Diagnosis not present

## 2020-02-08 NOTE — Telephone Encounter (Signed)
Called and LVM for the patient to call to schedule to see the provider tomorrow.  Martel Galvan,cma

## 2020-02-08 NOTE — Telephone Encounter (Signed)
  Patient reports blood pressure taken at home yesterday 178/106, today 192/99. Denies headache, blurred vision, dizziness, light headedness, arm pain, jaw pain. Notes she takes BP once per week per pcp instruction and readings are similar. Last OV 01/23/20.  Last NV BP check 01/25/20, states she brought her cuff and readings were close. Mammogram scheduled today where BP may be taken again. Unclear of direction she was given by pcp should BP remain high. Declined scheduling as of now but will follow as pcp directs. Please advise.

## 2020-02-08 NOTE — Therapy (Signed)
[facility] [address] [place], [address] Phone: [phone]   Fax:  [phone]  Physical Therapy Treatment  Patient Details  Name: [name] MRN: [id number] Date of Birth: October 09, 1940 Referring Provider (PT): Tamala Julian   Encounter Date: 02/08/2020   PT End of Session - 02/08/20 1303    Visit Number 34    Number of Visits 40    Date for PT Re-Evaluation 02/13/20    Authorization Type 8/10    PT Start Time 1300    PT Stop Time 1345    PT Time Calculation (min) 45 min    Equipment Utilized During Treatment Gait belt    Activity Tolerance Patient tolerated treatment well;No increased pain    Behavior During Therapy WFL for tasks assessed/performed           Past Medical History:  Diagnosis Date  . (HFpEF) heart failure with preserved ejection fraction ([place])    a. 08/2017 Echo: EF 55-60%, no rwma, mild MR, mildly dil LA, nl RV fxn.  . Breast cancer ([place]) 2001   left breast  . Cancer ([place]) 2001   left breast ca  . Carotid arterial disease ([place])    a. 10/2004 s/p L CEA; b. 12/2015 Carotid U/S: RICA 1-39%; b. LICA patent CEA site.  Marland Kitchen GERD (gastroesophageal reflux disease)   . Hyperlipidemia   . Hypertension   . Osteoarthritis, multiple sites   . Osteopenia   . Persistent atrial fibrillation ([place])    a. Dx 08/2017; b. CHA2DS2VASc = 6-->Xarelto; c. 09/2017 Successful DCCV (second shock - 200J);   Marland Kitchen Personal history of radiation therapy 2001   left breast ca  . Psoriasis     Past Surgical History:  Procedure Laterality Date  . ABDOMINAL HYSTERECTOMY    . ANKLE FRACTURE SURGERY  4/08   left---hardware still in place  . 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 BREAST EXCISIONAL BIOPSY Left yrs ago   benign  . BREAST LUMPECTOMY Left 2001   f/u radiation  . CARDIOVERSION N/A 10/04/2017   Procedure: CARDIOVERSION;  Surgeon: Wellington Hampshire, MD;  Location: [facility] ORS;  Service:  Cardiovascular;  Laterality: N/A;  . CARDIOVERSION N/A 12/16/2017   Procedure: CARDIOVERSION;  Surgeon: [name], MD;  Location: [facility] ORS;  Service: Cardiovascular;  Laterality: N/A;  . CAROTID ENDARTERECTOMY Left 10/23/2004  . FRACTURE SURGERY    . OOPHORECTOMY    . SHOULDER SURGERY  6/07   left  . TONSILLECTOMY AND ADENOIDECTOMY    . TOTAL HIP ARTHROPLASTY  2004   right    There were no vitals filed for this visit.   Subjective Assessment - 02/08/20 1301    Subjective Patient reports she has no issues.    Pertinent History Patient previously seen for balance difficulties, shoulder pain and the right, and low back pain.  Patient reports no falls in the past six months. Patient reports pain has not improved for the past month and has been about the same since the onset of pain.    Limitations Sitting;Standing;Walking    Patient Stated Goals To improve balance when walking, driving pain    Currently in Pain? Yes    Pain Score 3     Pain Onset More than a month ago            INTERVENTIONS    Therapeutic Exercise  - Sit to stands with use of TRX straps using  gray chair - 2x10  - Standing Hip Extension with UE support x15 bilat.  - Standing Hip Abduction with UE support x10 bilat  - Mini Lunges in standing with UE support - 1X15 - Leg swings frontal and sagittal 1x15 bilat each  - Step-up x8 bilat on 8inch step - Lateral step-up x8 bilat on 8inch step - Heel and Toe Raises with UE Support x20   Performed exercises to improve LE strength, stability, and balance    SPC training for gait stability and righting  -SPC hurdle (5) training for step overs x 5 (L and R foot first) -SPC hurdle figure 8's x4  -SPC step up and down x3  -SPC on uneven terrain ([place])     PT Education - 02/08/20 1303    Education Details form/technique with exercise    Person(s) Educated Patient    Methods Explanation;Demonstration    Comprehension Verbalized understanding;Returned  demonstration            PT Short Term Goals - 10/26/19 1540      PT SHORT TERM GOAL #1   Title Patient will have a worst pain score of 6/10 to better be able to drive with less overall pain    Baseline 8/10; 10/26/2019: 6/10    Time 3    Period Weeks    Status On-going    Target Date 10/09/19             PT Long Term Goals - 01/16/20 1307      PT LONG TERM GOAL #1   Title Patient will be independent with HEP to continue benefits of therapy after discharge.     Baseline Dependent with form and technique for exercises; 10/26/2019: cueing needed to complete HEP; 12/06/2019:  performing exercises at home    Time 6    Period Weeks      PT LONG TERM GOAL #2   Title Patient will improve 67mWT to over 1 m/s to indicate increased safety with ambulating in a community environment.    Baseline .66 m/s; 10/26/2019: .75 m/s ; 12/06/2019: . 8 m/s; 01/16/20: .91 m/s    Time 6    Period Weeks    Status On-going      PT LONG TERM GOAL #3   Title Patient will improve FGA to 63 to indicate significant functional improvement of R hip function.    Baseline FGA: 44; 10/26/2019: FGA: 48; 5/192/2019: 53;    Time 6    Period Weeks    Status Deferred      PT LONG TERM GOAL #4   Title Patient will be able to balance for 10sec with SLS to improve static balance and ability to get dressed when in standing    Baseline 2.5 sec B; 10/26/2019: 2.5 sec; 12/06/2019: 3 sec; 01/16/20: 4 sec    Time 6    Period Weeks    Status On-going      PT LONG TERM GOAL #5   Title Patient will have a worst hip pain of a 3/10 over the past week to indicate functional improvement with transferring and driving    Baseline worst pain: 8/10; 10/26/2019: 6/10; 12/06/2019: 3/10, 01/16/20: 0/10    Time 6    Period Weeks    Status Achieved      Additional Long Term Goals   Additional Long Term Goals Yes      PT LONG TERM GOAL #6   Title Patient will improve LEFS score by 12 points  to show an improvement in functional movements  and mobility.    Baseline 46    Time 6    Period Weeks    Status New    Target Date 02/27/20                 Plan - 02/08/20 1304    Clinical Impression Statement Continued working on strength and static/dynamic balance during todays session.  Patient is still having trouble with confidence when stepping up and with movements where R LE is needed for power and stance.  Patient also continues to have difficulty with balance activities. Patient will continue to benefit from skilled therapy to return to prior level of function.    Personal Factors and Comorbidities Age;Comorbidity 2    Comorbidities hip fx, RA    Examination-Activity Limitations Lift;Squat;Stand;Transfers    Stability/Clinical Decision Making Evolving/Moderate complexity    Clinical Decision Making Moderate    Rehab Potential Good    PT Frequency 2x / week    PT Duration 6 weeks    PT Treatment/Interventions Neuromuscular re-education;Stair training;Moist Heat;Cryotherapy;Iontophoresis 4mg /ml Dexamethasone;Electrical Stimulation;DME Instruction;Therapeutic activities;Therapeutic exercise;Balance training;Manual techniques;Patient/family education;Passive range of motion;Dry needling;Joint Manipulations    PT Next Visit Plan progress hip strengthening, use of SPC    PT Home Exercise Plan See education section    Consulted and Agree with Plan of Care Patient           Patient will benefit from skilled therapeutic intervention in order to improve the following deficits and impairments:  Abnormal gait, Decreased activity tolerance, Decreased endurance, Decreased range of motion, Decreased strength, Decreased balance, Difficulty walking, Decreased cognition, Pain, Decreased coordination, Increased muscle spasms, Postural dysfunction, Hypomobility  Visit Diagnosis: Unsteadiness on feet  History of falling  Pain in right hip  Muscle weakness (generalized)  Acute bilateral low back pain without  sciatica     Problem List Patient Active Problem List   Diagnosis Date Noted  . Anxiety 01/23/2020  . Closed right hip fracture ([place]) 09/20/2019  . Gout 05/18/2019  . Clavi 03/09/2019  . Right hip pain 07/08/2018  . Chronic bilateral low back pain without sciatica 12/31/2017  . Atrial fibrillation ([place]) 09/10/2017  . Trapezius strain, right, initial encounter 09/10/2017  . Depression, major, single episode, mild ([place]) 09/10/2017  . Chronic venous insufficiency 09/09/2017  . Lymphedema 09/09/2017  . Pain of toe of left foot 08/17/2017  . Leg swelling 07/31/2017  . Dry eyes 07/06/2017  . Pain and swelling of lower extremity, right 03/29/2017  . Right shoulder pain 03/29/2017  . Diarrhea 05/18/2016  . Fall 05/01/2016  . Leg weakness, bilateral 12/30/2015  . Macrocytic anemia 05/19/2013  . Psoriatic arthritis ([place]) 02/03/2013  . Right hand pain 08/05/2012  . Psoriasis   . Osteoarthritis, multiple sites   . History of breast cancer 10/28/2011  . GERD (gastroesophageal reflux disease)   . Hypertension   . Osteopenia   . Carotid stenosis 05/27/2011   1:47 PM, 02/08/20 Margarito Liner, SPT Student Physical Therapist [place]  [phone]  Margarito Liner 02/08/2020, 1:41 PM Merdis Delay, PT, DPT Physical Therapist - [facility] [address] [place], [address] Phone: [phone]   Fax:  [phone]  Name: [name] MRN: [id number] Date of Birth: [birth year]

## 2020-02-08 NOTE — Telephone Encounter (Signed)
Per the not from her nurse visit her BP cuff was not accurate and [name] noted it was off by 20+ points. I recommended at that time that she get a new BP cuff. Her BP was low when last checked in the office. She needs to have this checked in the office. She could be offered an appointment with me tomorrow for check of this. If she develops any symptoms such as chest pain or shortness of breath or those listed in [name]'s note she should be evaluated right away.

## 2020-02-08 NOTE — Patient Instructions (Addendum)
[name] , Thank you for taking time to come for your Medicare Wellness Visit. I appreciate your ongoing commitment to your health goals. Please review the following plan we discussed and let me know if I can assist you in the future.   These are the goals we discussed: Goals      Patient Stated   .  Increase physical activity (pt-stated)       This is a list of the screening recommended for you and due dates:  Health Maintenance  Topic Date Due  . Mammogram  02/07/2020  . Flu Shot  02/18/2020  . Tetanus Vaccine  03/07/2021  . DEXA scan (bone density measurement)  Completed  . COVID-19 Vaccine  Completed  . Pneumonia vaccines  Completed    Immunizations Immunization History  Administered Date(s) Administered  . Influenza Split 05/20/2011, 05/10/2013  . Influenza, High Dose Seasonal PF 05/10/2015, 04/07/2019  . Influenza-Unspecified 05/11/2014, 04/19/2016, 04/24/2017  . PFIZER SARS-COV-2 Vaccination 07/31/2019, 08/21/2019  . Pneumococcal Conjugate-13 12/13/2013  . Pneumococcal Polysaccharide-23 06/19/2009  . Tdap 03/08/2011   Keep all routine maintenance appointments.   Mammogram scheduled today.   Follow up 05/27/20 @ 9:00  Advanced directives: End of life planning; Advance aging; Advanced directives discussed.  Copy of current HCPOA/Living Will requested.    Conditions/risks identified: none new  Follow up in one year for your annual wellness visit.   Preventive Care 81 Years and Older, Female Preventive care refers to lifestyle choices and visits with your health care provider that can promote health and wellness. What does preventive care include?  A yearly physical exam. This is also called an annual well check.  Dental exams once or twice a year.  Routine eye exams. Ask your health care provider how often you should have your eyes checked.  Personal lifestyle choices, including:  Daily care of your teeth and gums.  Regular physical  activity.  Eating a healthy diet.  Avoiding tobacco and drug use.  Limiting alcohol use.  Practicing safe sex.  Taking low-dose aspirin every day.  Taking vitamin and mineral supplements as recommended by your health care provider. What happens during an annual well check? The services and screenings done by your health care provider during your annual well check will depend on your age, overall health, lifestyle risk factors, and family history of disease. Counseling  Your health care provider may ask you questions about your:  Alcohol use.  Tobacco use.  Drug use.  Emotional well-being.  Home and relationship well-being.  Sexual activity.  Eating habits.  History of falls.  Memory and ability to understand (cognition).  Work and work Statistician.  Reproductive health. Screening  You may have the following tests or measurements:  Height, weight, and BMI.  Blood pressure.  Lipid and cholesterol levels. These may be checked every 5 years, or more frequently if you are over 64 years old.  Skin check.  Lung cancer screening. You may have this screening every year starting at age 75 if you have a 30-pack-year history of smoking and currently smoke or have quit within the past 15 years.  Fecal occult blood test (FOBT) of the stool. You may have this test every year starting at age 49.  Flexible sigmoidoscopy or colonoscopy. You may have a sigmoidoscopy every 5 years or a colonoscopy every 10 years starting at age 62.  Hepatitis C blood test.  Hepatitis B blood test.  Sexually transmitted disease (STD) testing.  Diabetes screening. This is done by  checking your blood sugar (glucose) after you have not eaten for a while (fasting). You may have this done every 1-3 years.  Bone density scan. This is done to screen for osteoporosis. You may have this done starting at age 46.  Mammogram. This may be done every 1-2 years. Talk to your health care provider about  how often you should have regular mammograms. Talk with your health care provider about your test results, treatment options, and if necessary, the need for more tests. Vaccines  Your health care provider may recommend certain vaccines, such as:  Influenza vaccine. This is recommended every year.  Tetanus, diphtheria, and acellular pertussis (Tdap, Td) vaccine. You may need a Td booster every 10 years.  Zoster vaccine. You may need this after age 29.  Pneumococcal 13-valent conjugate (PCV13) vaccine. One dose is recommended after age 2.  Pneumococcal polysaccharide (PPSV23) vaccine. One dose is recommended after age 14. Talk to your health care provider about which screenings and vaccines you need and how often you need them. This information is not intended to replace advice given to you by your health care provider. Make sure you discuss any questions you have with your health care provider. Document Released: 08/02/2015 Document Revised: 03/25/2016 Document Reviewed: 05/07/2015 Elsevier Interactive Patient Education  2017 [place] Prevention in the Home Falls can cause injuries. They can happen to people of all ages. There are many things you can do to make your home safe and to help prevent falls. What can I do on the outside of my home?  Regularly fix the edges of walkways and driveways and fix any cracks.  Remove anything that might make you trip as you walk through a door, such as a raised step or threshold.  Trim any bushes or trees on the path to your home.  Use bright outdoor lighting.  Clear any walking paths of anything that might make someone trip, such as rocks or tools.  Regularly check to see if handrails are loose or broken. Make sure that both sides of any steps have handrails.  Any raised decks and porches should have guardrails on the edges.  Have any leaves, snow, or ice cleared regularly.  Use sand or salt on walking paths during  winter.  Clean up any spills in your garage right away. This includes oil or grease spills. What can I do in the bathroom?  Use night lights.  Install grab bars by the toilet and in the tub and shower. Do not use towel bars as grab bars.  Use non-skid mats or decals in the tub or shower.  If you need to sit down in the shower, use a plastic, non-slip stool.  Keep the floor dry. Clean up any water that spills on the floor as soon as it happens.  Remove soap buildup in the tub or shower regularly.  Attach bath mats securely with double-sided non-slip rug tape.  Do not have throw rugs and other things on the floor that can make you trip. What can I do in the bedroom?  Use night lights.  Make sure that you have a light by your bed that is easy to reach.  Do not use any sheets or blankets that are too big for your bed. They should not hang down onto the floor.  Have a firm chair that has side arms. You can use this for support while you get dressed.  Do not have throw rugs and other things on the  floor that can make you trip. What can I do in the kitchen?  Clean up any spills right away.  Avoid walking on wet floors.  Keep items that you use a lot in easy-to-reach places.  If you need to reach something above you, use a strong step stool that has a grab bar.  Keep electrical cords out of the way.  Do not use floor polish or wax that makes floors slippery. If you must use wax, use non-skid floor wax.  Do not have throw rugs and other things on the floor that can make you trip. What can I do with my stairs?  Do not leave any items on the stairs.  Make sure that there are handrails on both sides of the stairs and use them. Fix handrails that are broken or loose. Make sure that handrails are as long as the stairways.  Check any carpeting to make sure that it is firmly attached to the stairs. Fix any carpet that is loose or worn.  Avoid having throw rugs at the top or  bottom of the stairs. If you do have throw rugs, attach them to the floor with carpet tape.  Make sure that you have a light switch at the top of the stairs and the bottom of the stairs. If you do not have them, ask someone to add them for you. What else can I do to help prevent falls?  Wear shoes that:  Do not have high heels.  Have rubber bottoms.  Are comfortable and fit you well.  Are closed at the toe. Do not wear sandals.  If you use a stepladder:  Make sure that it is fully opened. Do not climb a closed stepladder.  Make sure that both sides of the stepladder are locked into place.  Ask someone to hold it for you, if possible.  Clearly mark and make sure that you can see:  Any grab bars or handrails.  First and last steps.  Where the edge of each step is.  Use tools that help you move around (mobility aids) if they are needed. These include:  Canes.  Walkers.  Scooters.  Crutches.  Turn on the lights when you go into a dark area. Replace any light bulbs as soon as they burn out.  Set up your furniture so you have a clear path. Avoid moving your furniture around.  If any of your floors are uneven, fix them.  If there are any pets around you, be aware of where they are.  Review your medicines with your doctor. Some medicines can make you feel dizzy. This can increase your chance of falling. Ask your doctor what other things that you can do to help prevent falls. This information is not intended to replace advice given to you by your health care provider. Make sure you discuss any questions you have with your health care provider. Document Released: 05/02/2009 Document Revised: 12/12/2015 Document Reviewed: 08/10/2014 Elsevier Interactive Patient Education  2017 [employer].

## 2020-02-08 NOTE — Progress Notes (Signed)
Subjective:   [name] is a 79 y.o. female who presents for Medicare Annual (Subsequent) preventive examination.  Review of Systems    No ROS.  Medicare Wellness Virtual Visit.   Cardiac Risk Factors include: advanced age (>16men, >90 women);hypertension     Objective:    Today's Vitals   02/08/20 0832  BP: (!) 192/99  Pulse: 62  Weight: 116 lb (52.6 kg)  Height: 5\' 6"  (1.676 m)   Body mass index is 18.72 kg/m.  Patient reports blood pressure taken at home yesterday 178/106, today 192/99. Denies headache, blurred vision, dizziness, light headedness, arm pain, jaw pain. Notes she takes BP once per week per pcp instruction and readings are similar. Last OV 01/23/20.  Last [place] BP check 01/25/20, states she brought her cuff and readings were close. Mammogram scheduled today where BP may be taken again. Unclear of direction she was given should BP remain high. Declined scheduling as of now but will follow as pcp directs.    Advanced Directives 02/08/2020 10/10/2019 09/18/2019 07/03/2019 02/07/2019 12/16/2017 10/04/2017  Does Patient Have a Medical Advance Directive? Yes Yes Yes Yes Yes No Yes  Type of Paramedic of [place];Living will Living will;Healthcare Power of Attorney - Living will Out of facility DNR (pink MOST or yellow form);[place];Living will - [place];Living will  Does patient want to make changes to medical advance directive? No - Patient declined - - - No - Patient declined - -  Copy of [place] in Chart? No - copy requested No - copy requested - - No - copy requested - -  Would patient like information on creating a medical advance directive? - - - - - No - Patient declined -    Current Medications (verified) Outpatient Encounter Medications as of 02/08/2020  Medication Sig  . allopurinol (ZYLOPRIM) 100 MG tablet Take 100 mg by mouth daily.  . Cholecalciferol (VITAMIN D3) 1000 units  CAPS Take 1 capsule by mouth 2 (two) times daily.   . flecainide (TAMBOCOR) 50 MG tablet TAKE 1 TABLET BY MOUTH TWICE A DAY  . furosemide (LASIX) 40 MG tablet TAKE 1 TABLET BY MOUTH EVERY DAY  . LORazepam (ATIVAN) 0.5 MG tablet TAKE 1 TABLET BY MOUTH TWICE A DAY AS NEEDED FOR ANXIETY  . metoprolol tartrate (LOPRESSOR) 50 MG tablet Take 1/2 tablet (25 mg) by mouth twice daily  . mirtazapine (REMERON) 15 MG tablet Take 15 mg by mouth at bedtime.  . Multiple Vitamin (MULTIVITAMIN) capsule Take 1 capsule by mouth daily.    . Multiple Vitamins-Minerals (PRESERVISION AREDS 2) CAPS Take 1 tablet by mouth 2 (two) times daily.  Marland Kitchen PRADAXA 150 MG CAPS capsule TAKE 1 CAPSULE BY MOUTH TWICE A DAY   No facility-administered encounter medications on file as of 02/08/2020.    Allergies (verified) Patient has no known allergies.   History: Past Medical History:  Diagnosis Date  . (HFpEF) heart failure with preserved ejection fraction ([place])    a. 08/2017 Echo: EF 55-60%, no rwma, mild MR, mildly dil LA, nl RV fxn.  . Breast cancer ([place]) 2001   left breast  . Cancer ([place]) 2001   left breast ca  . Carotid arterial disease ([place])    a. 10/2004 s/p L CEA; b. 12/2015 Carotid U/S: RICA 1-39%; b. LICA patent CEA site.  Marland Kitchen GERD (gastroesophageal reflux disease)   . Hyperlipidemia   . Hypertension   . Osteoarthritis, multiple sites   .  Osteopenia   . Persistent atrial fibrillation ([place])    a. Dx 08/2017; b. CHA2DS2VASc = 6-->Xarelto; c. 09/2017 Successful DCCV (second shock - 200J);   Marland Kitchen Personal history of radiation therapy 2001   left breast ca  . Psoriasis    Past Surgical History:  Procedure Laterality Date  . ABDOMINAL HYSTERECTOMY    . ANKLE FRACTURE SURGERY  4/08   left---hardware still in place  . 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 BREAST EXCISIONAL BIOPSY Left yrs ago   benign  . BREAST LUMPECTOMY Left 2001   f/u radiation  . CARDIOVERSION N/A 10/04/2017   Procedure: CARDIOVERSION;   Surgeon: Wellington Hampshire, MD;  Location: [facility] ORS;  Service: Cardiovascular;  Laterality: N/A;  . CARDIOVERSION N/A 12/16/2017   Procedure: CARDIOVERSION;  Surgeon: [name], MD;  Location: [facility] ORS;  Service: Cardiovascular;  Laterality: N/A;  . CAROTID ENDARTERECTOMY Left 10/23/2004  . FRACTURE SURGERY    . OOPHORECTOMY    . SHOULDER SURGERY  6/07   left  . TONSILLECTOMY AND ADENOIDECTOMY    . TOTAL HIP ARTHROPLASTY  2004   right   Family History  Problem Relation Age of Onset  . Hypertension Brother   . Pneumonia Mother   . Leukemia Father   . Heart disease Neg Hx   . Diabetes Neg Hx    Social History   Socioeconomic History  . Marital status: Widowed    Spouse name: Not on file  . Number of children: Not on file  . Years of education: Not on file  . Highest education level: Not on file  Occupational History  . Occupation: Pharmacologist    Comment: Retired  Tobacco Use  . Smoking status: Former Smoker    Packs/day: 1.00    Years: 40.00    Pack years: 40.00    Types: Cigarettes    Quit date: 07/21/1995    Years since quitting: 24.5  . Smokeless tobacco: Never Used  Vaping Use  . Vaping Use: Never used  Substance and Sexual Activity  . Alcohol use: Yes    Alcohol/week: 16.0 standard drinks    Types: 2 Glasses of wine, 14 Standard drinks or equivalent per week    Comment: daily 1-2  . Drug use: No  . Sexual activity: Never  Other Topics Concern  . Not on file  Social History Narrative   1 natural child   3 adopted children   Artist---still teaches water colors   Husband has Alzheimers      Has living will   Daughter Amy is health care POA   DNR    No tube feeds if cognitively unaware   Social Determinants of Health   Financial Resource Strain: Low Risk   . Difficulty of Paying Living Expenses: Not hard at all  Food Insecurity: No Food Insecurity  . Worried About Charity fundraiser in the Last Year: Never true  . Ran Out of Food in the Last Year:  Never true  Transportation Needs: No Transportation Needs  . Lack of Transportation (Medical): No  . Lack of Transportation (Non-Medical): No  Physical Activity:   . Days of Exercise per Week:   . Minutes of Exercise per Session:   Stress:   . Feeling of Stress :   Social Connections:   . Frequency of Communication with Friends and Family:   . Frequency of Social Gatherings with Friends and Family:   . Attends Religious Services:   .  Active Member of Clubs or Organizations:   . Attends Archivist Meetings:   Marland Kitchen Marital Status:     Tobacco Counseling Counseling given: Not Answered   Clinical Intake:  Pre-visit preparation completed: Yes        Diabetes: No  How often do you need to have someone help you when you read instructions, pamphlets, or other written materials from your doctor or pharmacy?: 1 - Never Interpreter Needed?: No      Activities of Daily Living In your present state of health, do you have any difficulty performing the following activities: 02/08/2020  Hearing? N  Vision? N  Difficulty concentrating or making decisions? N  Walking or climbing stairs? Y  Comment Unsteady gait, cane in use as needed  Dressing or bathing? N  Doing errands, shopping? N  Preparing Food and eating ? N  Using the Toilet? N  In the past six months, have you accidently leaked urine? N  Do you have problems with loss of bowel control? N  Managing your Medications? N  Managing your Finances? N  Housekeeping or managing your Housekeeping? N  Some recent data might be hidden    Patient Care Team: [name], MD as PCP - General (Family Medicine) [name], MD as PCP - Cardiology (Cardiology)  Indicate any recent Medical Services you may have received from other than [facility] providers in the past year (date may be approximate).     Assessment:   This is a routine wellness examination for [place].  I connected with Sharnese today by telephone  and verified that I am speaking with the correct person using two identifiers. Location patient: home Location provider: work Persons participating in the virtual visit: patient, Marine scientist.    I discussed the limitations, risks, security and privacy concerns of performing an evaluation and management service by telephone and the availability of in person appointments. The patient expressed understanding and verbally consented to this telephonic visit.    Interactive audio and video telecommunications were attempted between this provider and patient, however failed, due to patient having technical difficulties OR patient did not have access to video capability.  We continued and completed visit with audio only.  Some vital signs may be absent or patient reported.   Hearing/Vision screen  Hearing Screening   125Hz  250Hz  500Hz  1000Hz  2000Hz  3000Hz  4000Hz  6000Hz  8000Hz   Right ear:           Left ear:           Comments: Patient is able to hear conversational tones without difficulty.  No issues reported.  Vision Screening Comments: Followed by [facility]  Wears reader lenses Cataract extraction, bilateral Visual acuity not assessed, virtual visit.  They have seen their ophthalmologist in the last 12 months.   Dietary issues and exercise activities discussed: Current Exercise Habits: Home exercise routine, Type of exercise: stretching (Physical therapy, wellness center exercise), Time (Minutes): 45, Frequency (Times/Week): 2, Weekly Exercise (Minutes/Week): 90, Intensity: Mild  Healthy diet Fair water intake Caffeine- 1-2 cups daily  Goals      Patient Stated   .  Increase physical activity (pt-stated)      Depression Screen PHQ 2/9 Scores 02/08/2020 01/23/2020 05/17/2019 02/07/2019 07/08/2018 07/08/2018 11/03/2016  PHQ - 2 Score 0 0 0 0 3 3 3   PHQ- 9 Score - - - - 6 - 13    Fall Risk Fall Risk  02/08/2020 09/20/2019 05/17/2019 02/07/2019 07/08/2018  Falls in the past year?  0 1 1 0  0  Number falls in past yr: - 0 0 - 0  Injury with Fall? - 1 1 - 0  Risk for fall due to : Impaired balance/gait - - - -  Follow up Falls evaluation completed Falls evaluation completed Falls evaluation completed - -   No falls since last reported 06/2019.   Handrails in use when climbing stairs? Yes Home free of loose throw rugs in walkways, pet beds, electrical cords, etc? Yes  Adequate lighting in your home to reduce risk of falls? Yes   ASSISTIVE DEVICES UTILIZED TO PREVENT FALLS:  Use of a cane, walker or w/c? Yes  Grab bars in the bathroom? Yes  Shower chair or bench in shower? Yes  Elevated toilet seat or a handicapped toilet? Yes   TIMED UP AND GO:  Was the test performed? No . Virtual visit.    Cognitive Function: MMSE - Mini Mental State Exam 09/12/2015  Orientation to time 5  Orientation to Place 5  Registration 3  Attention/ Calculation 5  Recall 3  Language- name 2 objects 2  Language- repeat 1  Language- follow 3 step command 3  Language- read & follow direction 1  Write a sentence 1  Copy design 1  Total score 30     6CIT Screen 02/08/2020 02/07/2019 09/11/2016  What Year? 0 points 0 points 0 points  What month? 0 points 0 points -  What time? 0 points 0 points 0 points  Count back from 20 - 0 points 0 points  Months in reverse - 0 points 0 points  Repeat phrase - 0 points 0 points  Total Score - 0 -    Immunizations Immunization History  Administered Date(s) Administered  . Influenza Split 05/20/2011, 05/10/2013  . Influenza, High Dose Seasonal PF 05/10/2015, 04/07/2019  . Influenza-Unspecified 05/11/2014, 04/19/2016, 04/24/2017  . PFIZER SARS-COV-2 Vaccination 07/31/2019, 08/21/2019  . Pneumococcal Conjugate-13 12/13/2013  . Pneumococcal Polysaccharide-23 06/19/2009  . Tdap 03/08/2011    Health Maintenance Health Maintenance  Topic Date Due  . MAMMOGRAM  02/07/2020  . INFLUENZA VACCINE  02/18/2020  . TETANUS/TDAP  03/07/2021  . DEXA  SCAN  Completed  . COVID-19 Vaccine  Completed  . PNA vac Low Risk Adult  Completed   Dental Screening: Recommended annual dental exams for proper oral hygiene. Visits every 6 months.   Community Resource Referral / Chronic Care Management: CRR required this visit?  No   CCM required this visit?  No      Plan:   Keep all routine maintenance appointments.   Mammogram scheduled today.   Follow up 05/27/20 @ 9:00  I have personally reviewed and noted the following in the patient's chart:   . Medical and social history . Use of alcohol, tobacco or illicit drugs  . Current medications and supplements . Functional ability and status . Nutritional status . Physical activity . Advanced directives . List of other physicians . Hospitalizations, surgeries, and ER visits in previous 12 months . Vitals . Screenings to include cognitive, depression, and falls . Referrals and appointments  In addition, I have reviewed and discussed with patient certain preventive protocols, quality metrics, and best practice recommendations. A written personalized care plan for preventive services as well as general preventive health recommendations were provided to patient.     Varney Biles, LPN   08/11/4823   Nurse Notes: Note sent to provider regarding blood pressure.

## 2020-02-09 ENCOUNTER — Encounter: Payer: Self-pay | Admitting: Family Medicine

## 2020-02-09 ENCOUNTER — Ambulatory Visit (INDEPENDENT_AMBULATORY_CARE_PROVIDER_SITE_OTHER): Payer: Medicare Other | Admitting: Family Medicine

## 2020-02-09 VITALS — BP 150/80 | HR 61 | Temp 97.5°F | Ht 66.0 in | Wt 118.4 lb

## 2020-02-09 DIAGNOSIS — R5383 Other fatigue: Secondary | ICD-10-CM

## 2020-02-09 DIAGNOSIS — R0600 Dyspnea, unspecified: Secondary | ICD-10-CM | POA: Diagnosis not present

## 2020-02-09 DIAGNOSIS — R0609 Other forms of dyspnea: Secondary | ICD-10-CM | POA: Insufficient documentation

## 2020-02-09 DIAGNOSIS — I6523 Occlusion and stenosis of bilateral carotid arteries: Secondary | ICD-10-CM

## 2020-02-09 DIAGNOSIS — I10 Essential (primary) hypertension: Secondary | ICD-10-CM

## 2020-02-09 LAB — TSH: TSH: 3.12 u[IU]/mL (ref 0.35–4.50)

## 2020-02-09 LAB — COMPREHENSIVE METABOLIC PANEL
ALT: 8 U/L (ref 0–35)
AST: 15 U/L (ref 0–37)
Albumin: 4 g/dL (ref 3.5–5.2)
Alkaline Phosphatase: 88 U/L (ref 39–117)
BUN: 25 mg/dL — ABNORMAL HIGH (ref 6–23)
CO2: 31 mEq/L (ref 19–32)
Calcium: 9.8 mg/dL (ref 8.4–10.5)
Chloride: 101 mEq/L (ref 96–112)
Creatinine, Ser: 1.07 mg/dL (ref 0.40–1.20)
GFR: 49.23 mL/min — ABNORMAL LOW (ref >60.00)
Glucose, Bld: 96 mg/dL (ref 70–99)
Potassium: 4.4 mEq/L (ref 3.5–5.1)
Sodium: 140 mEq/L (ref 135–145)
Total Bilirubin: 0.5 mg/dL (ref 0.2–1.2)
Total Protein: 6.5 g/dL (ref 6.0–8.3)

## 2020-02-09 LAB — CBC
HCT: 34.5 % — ABNORMAL LOW (ref 36.0–46.0)
Hemoglobin: 11.3 g/dL — ABNORMAL LOW (ref 12.0–15.0)
MCHC: 32.9 g/dL (ref 30.0–36.0)
MCV: 113.5 fl — ABNORMAL HIGH (ref 78.0–100.0)
Platelets: 282 10*3/uL (ref 150.0–400.0)
RBC: 3.04 Mil/uL — ABNORMAL LOW (ref 3.87–5.11)
RDW: 14.8 % (ref 11.5–15.5)
WBC: 5.2 10*3/uL (ref 4.0–10.5)

## 2020-02-09 MED ORDER — LOSARTAN POTASSIUM 50 MG PO TABS: 50.0000 mg | ORAL_TABLET | Freq: Every day | ORAL | 3 refills | Status: DC

## 2020-02-09 NOTE — Assessment & Plan Note (Signed)
Much more elevated than previously.  Prior nurse visit with BP check did reveal that her cuff is inaccurate though she has had multiple checks recently with elevated BPs in healthcare settings.  We will add losartan.  We will check lab work as outlined below.

## 2020-02-09 NOTE — Assessment & Plan Note (Addendum)
Patient reports feeling lethargic.  Discussed there are a number of potential possible causes for this.  Lab work as outlined below.  We will determine the next step once labs return.

## 2020-02-09 NOTE — Patient Instructions (Signed)
Nice to see. We are going to start you on losartan for your blood pressure.  You will need lab work in 7 to 10 days.  We will also check your blood pressure at that time. If you develop chest pain or shortness of breath or any new symptoms please seek medical attention immediately. We will contact you with your lab results.

## 2020-02-09 NOTE — Progress Notes (Signed)
[name], MD Phone: [phone]  [name] is a 79 y.o. female who presents today for f/u.  HYPERTENSION  Disease Monitoring  Home BP Monitoring 137-176/78-106  Chest pain- no    Dyspnea- some on exertion for some time now, she has a difficult time pin pointing when this started. Notes it occurs once every couple of days.  Medications  Compliance-  Taking metoprolol, lasix.  Edema- no No abdominal pain.  She does note some ongoing lethargy for 6-8 weeks. Doesn't feel like doing much. Denies depression.     Social History   Tobacco Use  Smoking Status Former Smoker  . Packs/day: 1.00  . Years: 40.00  . Pack years: 40.00  . Types: Cigarettes  . Quit date: 07/21/1995  . Years since quitting: 24.5  Smokeless Tobacco Never Used     ROS see history of present illness  Objective  Physical Exam Vitals:   02/09/20 0841 02/09/20 0905  BP: (!) 160/100 (!) 150/80  Pulse: 61   Temp: (!) 97.5 F (36.4 C)   SpO2: 98%     BP Readings from Last 3 Encounters:  02/09/20 (!) 150/80  02/08/20 (!) 192/99  01/25/20 (!) 86/56   Wt Readings from Last 3 Encounters:  02/09/20 118 lb 6.4 oz (53.7 kg)  02/08/20 116 lb (52.6 kg)  01/23/20 116 lb 12.8 oz (53 kg)    Physical Exam Constitutional:      General: She is not in acute distress.    Appearance: She is not diaphoretic.  Cardiovascular:     Rate and Rhythm: Normal rate and regular rhythm.     Heart sounds: Normal heart sounds.  Pulmonary:     Effort: Pulmonary effort is normal.     Breath sounds: Normal breath sounds.  Musculoskeletal:     Right lower leg: No edema.     Left lower leg: No edema.  Skin:    General: Skin is warm and dry.  Neurological:     Mental Status: She is alert.    EKG: Sinus bradycardia, first-degree AV block, inversion of T wave in V2 seems to be stable compared to several prior EKGs.  No apparent acute changes  Assessment/Plan: Please see individual problem  list.  Hypertension Much more elevated than previously.  Prior nurse visit with BP check did reveal that her cuff is inaccurate though she has had multiple checks recently with elevated BPs in healthcare settings.  We will add losartan.  We will check lab work as outlined below.  DOE (dyspnea on exertion) Seems to be an ongoing issue.  Appears to be fairly mild in nature.  Could be deconditioning.  EKG performed today.  Lab work as outlined below.  Discussed the potential need to see her cardiologist.  Given return precautions.  Lethargy Patient reports feeling lethargic.  Discussed there are a number of potential possible causes for this.  Lab work as outlined below.  We will determine the next step once labs return.   Orders Placed This Encounter  Procedures  . TSH  . Comp Met (CMET)  . CBC  . Basic Metabolic Panel (BMET)    Standing Status:   Future    Standing Expiration Date:   02/08/2021  . EKG 12-Lead    Meds ordered this encounter  Medications  . losartan (COZAAR) 50 MG tablet    Sig: Take 1 tablet (50 mg total) by mouth daily.    Dispense:  90 tablet    Refill:  3  This visit occurred during the SARS-CoV-2 public health emergency.  Safety protocols were in place, including screening questions prior to the visit, additional usage of staff PPE, and extensive cleaning of exam room while observing appropriate contact time as indicated for disinfecting solutions.    Chanequa Spees, MD [facility] [place]  

## 2020-02-09 NOTE — Assessment & Plan Note (Signed)
Seems to be an ongoing issue.  Appears to be fairly mild in nature.  Could be deconditioning.  EKG performed today.  Lab work as outlined below.  Discussed the potential need to see her cardiologist.  Given return precautions.

## 2020-02-13 ENCOUNTER — Other Ambulatory Visit: Payer: Self-pay

## 2020-02-13 ENCOUNTER — Ambulatory Visit: Payer: Medicare Other

## 2020-02-13 DIAGNOSIS — M545 Low back pain: Secondary | ICD-10-CM | POA: Diagnosis not present

## 2020-02-13 DIAGNOSIS — R2681 Unsteadiness on feet: Secondary | ICD-10-CM

## 2020-02-13 DIAGNOSIS — M25551 Pain in right hip: Secondary | ICD-10-CM

## 2020-02-13 DIAGNOSIS — M6281 Muscle weakness (generalized): Secondary | ICD-10-CM

## 2020-02-13 DIAGNOSIS — Z9181 History of falling: Secondary | ICD-10-CM | POA: Diagnosis not present

## 2020-02-13 NOTE — Telephone Encounter (Signed)
Paitent is scheduled to see the provider on 02/16/2020.  [name],cma

## 2020-02-13 NOTE — Therapy (Signed)
[facility] [address], [place], [address] Phone: [phone]   Fax:  [phone]  Physical Therapy Treatment  Patient Details  Name: [name] MRN: [id number] Date of Birth: [birth year] Referring Provider (PT): Tamala Julian   Encounter Date: 02/13/2020   PT End of Session - 02/13/20 1304    Visit Number 35    Number of Visits 40    Date for PT Re-Evaluation 02/13/20    Authorization Type 9/10    PT Start Time 1300    PT Stop Time 1345    PT Time Calculation (min) 45 min    Equipment Utilized During Treatment Gait belt    Activity Tolerance Patient tolerated treatment well;No increased pain    Behavior During Therapy WFL for tasks assessed/performed           Past Medical History:  Diagnosis Date  . (HFpEF) heart failure with preserved ejection fraction ([place])    a. 08/2017 Echo: EF 55-60%, no rwma, mild MR, mildly dil LA, nl RV fxn.  . Breast cancer ([place]) 2001   left breast  . Cancer ([place]) 2001   left breast ca  . Carotid arterial disease ([place])    a. 10/2004 s/p L CEA; b. 12/2015 Carotid U/S: RICA 1-39%; b. LICA patent CEA site.  Marland Kitchen GERD (gastroesophageal reflux disease)   . Hyperlipidemia   . Hypertension   . Osteoarthritis, multiple sites   . Osteopenia   . Persistent atrial fibrillation ([place])    a. Dx 08/2017; b. CHA2DS2VASc = 6-->Xarelto; c. 09/2017 Successful DCCV (second shock - 200J);   Marland Kitchen Personal history of radiation therapy 2001   left breast ca  . Psoriasis     Past Surgical History:  Procedure Laterality Date  . ABDOMINAL HYSTERECTOMY    . ANKLE FRACTURE SURGERY  4/08   left---hardware still in place  . 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 BREAST EXCISIONAL BIOPSY Left yrs ago   benign  . BREAST LUMPECTOMY Left 2001   f/u radiation  . CARDIOVERSION N/A 10/04/2017   Procedure: CARDIOVERSION;  Surgeon: Wellington Hampshire, MD;  Location: [facility] ORS;  Service:  Cardiovascular;  Laterality: N/A;  . CARDIOVERSION N/A 12/16/2017   Procedure: CARDIOVERSION;  Surgeon: [name], MD;  Location: [facility] ORS;  Service: Cardiovascular;  Laterality: N/A;  . CAROTID ENDARTERECTOMY Left 10/23/2004  . FRACTURE SURGERY    . OOPHORECTOMY    . SHOULDER SURGERY  6/07   left  . TONSILLECTOMY AND ADENOIDECTOMY    . TOTAL HIP ARTHROPLASTY  2004   right    There were no vitals filed for this visit.   Subjective Assessment - 02/13/20 1302    Subjective Patient reports seeing her doctor last week and that her heart isnt doing so well.    Pertinent History Patient previously seen for balance difficulties, shoulder pain and the right, and low back pain.  Patient reports no falls in the past six months. Patient reports pain has not improved for the past month and has been about the same since the onset of pain.    Limitations Sitting;Standing;Walking    Patient Stated Goals To improve balance when walking, driving pain    Currently in Pain? Yes    Pain Score 2     Pain Onset More than a month ago           INTERVENTIONS    Therapeutic Exercise  - Sit  to stands with use of hands on knees using green chair with airex pad in seat - 2x5 - Standing Hip Extension with UE support x15 bilat.  - Standing Hip Abduction with UE support x10 bilat  - Mini Lunges in standing with UE support - 1X15 - Leg swings frontal and sagittal 1x15 bilat each  - Step-up x8 bilat on 8inch step -Balance AirEx pad:  Dynamic:                         Lateral walk x6   Tandem Walk x6  Static:   Tandem Stance 2x30sec    Performed exercises to improve LE strength, stability, and balance    SPC training for gait stability and righting  -Step overs with hurdles and airex pad in middle of hurdles x3 bilat SPC -Step overs 11.5inch x5 each leg leading with [facility]      PT Education - 02/13/20 1303    Education Details form/technique with exercise    Person(s) Educated Patient    Methods  Explanation;Demonstration    Comprehension Verbalized understanding;Returned demonstration            PT Short Term Goals - 10/26/19 1540      PT SHORT TERM GOAL #1   Title Patient will have a worst pain score of 6/10 to better be able to drive with less overall pain    Baseline 8/10; 10/26/2019: 6/10    Time 3    Period Weeks    Status On-going    Target Date 10/09/19             PT Long Term Goals - 01/16/20 1307      PT LONG TERM GOAL #1   Title Patient will be independent with HEP to continue benefits of therapy after discharge.     Baseline Dependent with form and technique for exercises; 10/26/2019: cueing needed to complete HEP; 12/06/2019:  performing exercises at home    Time 6    Period Weeks      PT LONG TERM GOAL #2   Title Patient will improve 18mWT to over 1 m/s to indicate increased safety with ambulating in a community environment.    Baseline .66 m/s; 10/26/2019: .75 m/s ; 12/06/2019: . 8 m/s; 01/16/20: .91 m/s    Time 6    Period Weeks    Status On-going      PT LONG TERM GOAL #3   Title Patient will improve FGA to 63 to indicate significant functional improvement of R hip function.    Baseline FGA: 44; 10/26/2019: FGA: 48; 5/192/2019: 53;    Time 6    Period Weeks    Status Deferred      PT LONG TERM GOAL #4   Title Patient will be able to balance for 10sec with SLS to improve static balance and ability to get dressed when in standing    Baseline 2.5 sec B; 10/26/2019: 2.5 sec; 12/06/2019: 3 sec; 01/16/20: 4 sec    Time 6    Period Weeks    Status On-going      PT LONG TERM GOAL #5   Title Patient will have a worst hip pain of a 3/10 over the past week to indicate functional improvement with transferring and driving    Baseline worst pain: 8/10; 10/26/2019: 6/10; 12/06/2019: 3/10, 01/16/20: 0/10    Time 6    Period Weeks    Status Achieved  Additional Long Term Goals   Additional Long Term Goals Yes      PT LONG TERM GOAL #6   Title Patient will  improve LEFS score by 12 points to show an improvement in functional movements and mobility.    Baseline 46    Time 6    Period Weeks    Status New    Target Date 02/27/20                 Plan - 02/13/20 1305    Clinical Impression Statement Focused on strength and balance during today's session today.  Patients balance is improving; however, she still requires moderate use of UE Support to decrease LOB during static and dynamic balance activities. Patient tolerated addition of STS without use of TRX straps well with minimal muscular fatigue. Patient is progressing and will continue to benefit from skilled therapy to return to prior level of function.    Personal Factors and Comorbidities Age;Comorbidity 2    Comorbidities hip fx, RA    Examination-Activity Limitations Lift;Squat;Stand;Transfers    Stability/Clinical Decision Making Evolving/Moderate complexity    Clinical Decision Making Moderate    Rehab Potential Good    PT Frequency 2x / week    PT Duration 6 weeks    PT Treatment/Interventions Neuromuscular re-education;Stair training;Moist Heat;Cryotherapy;Iontophoresis 4mg /ml Dexamethasone;Electrical Stimulation;DME Instruction;Therapeutic activities;Therapeutic exercise;Balance training;Manual techniques;Patient/family education;Passive range of motion;Dry needling;Joint Manipulations    PT Next Visit Plan progress hip strengthening, use of SPC    PT Home Exercise Plan See education section    Consulted and Agree with Plan of Care Patient           Patient will benefit from skilled therapeutic intervention in order to improve the following deficits and impairments:  Abnormal gait, Decreased activity tolerance, Decreased endurance, Decreased range of motion, Decreased strength, Decreased balance, Difficulty walking, Decreased cognition, Pain, Decreased coordination, Increased muscle spasms, Postural dysfunction, Hypomobility  Visit Diagnosis: Unsteadiness on  feet  History of falling  Pain in right hip  Muscle weakness (generalized)     Problem List Patient Active Problem List   Diagnosis Date Noted  . DOE (dyspnea on exertion) 02/09/2020  . Lethargy 02/09/2020  . Anxiety 01/23/2020  . Closed right hip fracture ([place]) 09/20/2019  . Gout 05/18/2019  . Clavi 03/09/2019  . Right hip pain 07/08/2018  . Chronic bilateral low back pain without sciatica 12/31/2017  . Atrial fibrillation ([place]) 09/10/2017  . Trapezius strain, right, initial encounter 09/10/2017  . Depression, major, single episode, mild ([place]) 09/10/2017  . Chronic venous insufficiency 09/09/2017  . Lymphedema 09/09/2017  . Pain of toe of left foot 08/17/2017  . Leg swelling 07/31/2017  . Dry eyes 07/06/2017  . Pain and swelling of lower extremity, right 03/29/2017  . Right shoulder pain 03/29/2017  . Diarrhea 05/18/2016  . Fall 05/01/2016  . Leg weakness, bilateral 12/30/2015  . Macrocytic anemia 05/19/2013  . Psoriatic arthritis ([place]) 02/03/2013  . Right hand pain 08/05/2012  . Psoriasis   . Osteoarthritis, multiple sites   . History of breast cancer 10/28/2011  . GERD (gastroesophageal reflux disease)   . Hypertension   . Osteopenia   . Carotid stenosis 05/27/2011   1:53 PM, 02/13/20 Margarito Liner, SPT Student Physical Therapist [place]  [phone]  Margarito Liner 02/13/2020, 1:51 PM  [facility] [address], [place], [address] Phone: [phone]   Fax:  [phone]  Name: SHANAIYA BENE MRN: [id number]  Date of Birth: [birth year]

## 2020-02-15 ENCOUNTER — Ambulatory Visit: Payer: Medicare Other

## 2020-02-15 ENCOUNTER — Other Ambulatory Visit: Payer: Self-pay

## 2020-02-15 ENCOUNTER — Telehealth: Payer: Self-pay

## 2020-02-15 DIAGNOSIS — Z9181 History of falling: Secondary | ICD-10-CM

## 2020-02-15 DIAGNOSIS — M6281 Muscle weakness (generalized): Secondary | ICD-10-CM

## 2020-02-15 DIAGNOSIS — M25551 Pain in right hip: Secondary | ICD-10-CM | POA: Diagnosis not present

## 2020-02-15 DIAGNOSIS — M545 Low back pain, unspecified: Secondary | ICD-10-CM

## 2020-02-15 DIAGNOSIS — R2681 Unsteadiness on feet: Secondary | ICD-10-CM

## 2020-02-15 NOTE — Therapy (Signed)
[facility] [address] [place], [address] Phone: [phone]   Fax:  [phone]  Physical Therapy Treatment/Progress Note   Patient Details  Name: [name] MRN: [id number] Date of Birth: 08-27-1940 Referring Provider (PT): Tamala Julian  Reporting Period:01/16/2020 -  02/15/2020 Encounter Date: 02/15/2020   PT End of Session - 02/15/20 1304    Visit Number 36    Number of Visits 40    Date for PT Re-Evaluation 02/13/20    Authorization Type 10/10    PT Start Time 1300    PT Stop Time 1345    PT Time Calculation (min) 45 min    Equipment Utilized During Treatment Gait belt    Activity Tolerance Patient tolerated treatment well;No increased pain    Behavior During Therapy WFL for tasks assessed/performed           Past Medical History:  Diagnosis Date  . (HFpEF) heart failure with preserved ejection fraction ([place])    a. 08/2017 Echo: EF 55-60%, no rwma, mild MR, mildly dil LA, nl RV fxn.  . Breast cancer ([place]) 2001   left breast  . Cancer ([place]) 2001   left breast ca  . Carotid arterial disease ([place])    a. 10/2004 s/p L CEA; b. 12/2015 Carotid U/S: RICA 1-39%; b. LICA patent CEA site.  Marland Kitchen GERD (gastroesophageal reflux disease)   . Hyperlipidemia   . Hypertension   . Osteoarthritis, multiple sites   . Osteopenia   . Persistent atrial fibrillation ([place])    a. Dx 08/2017; b. CHA2DS2VASc = 6-->Xarelto; c. 09/2017 Successful DCCV (second shock - 200J);   Marland Kitchen Personal history of radiation therapy 2001   left breast ca  . Psoriasis     Past Surgical History:  Procedure Laterality Date  . ABDOMINAL HYSTERECTOMY    . ANKLE FRACTURE SURGERY  4/08   left---hardware still in place  . 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 BREAST EXCISIONAL BIOPSY Left yrs ago   benign  . BREAST LUMPECTOMY Left 2001   f/u radiation  . CARDIOVERSION N/A 10/04/2017   Procedure: CARDIOVERSION;  Surgeon: Wellington Hampshire, MD;  Location: [facility] ORS;  Service: Cardiovascular;  Laterality: N/A;  . CARDIOVERSION N/A 12/16/2017   Procedure: CARDIOVERSION;  Surgeon: [name], MD;  Location: [facility] ORS;  Service: Cardiovascular;  Laterality: N/A;  . CAROTID ENDARTERECTOMY Left 10/23/2004  . FRACTURE SURGERY    . OOPHORECTOMY    . SHOULDER SURGERY  6/07   left  . TONSILLECTOMY AND ADENOIDECTOMY    . TOTAL HIP ARTHROPLASTY  2004   right    There were no vitals filed for this visit.   Subjective Assessment - 02/15/20 1302    Subjective Patient reports no issues at todays session.    Pertinent History Patient previously seen for balance difficulties, shoulder pain and the right, and low back pain.  Patient reports no falls in the past six months. Patient reports pain has not improved for the past month and has been about the same since the onset of pain.    Limitations Sitting;Standing;Walking    Patient Stated Goals To improve balance when walking, driving pain    Currently in Pain? Yes    Pain Score 2     Pain Onset More than a month ago            INTERVENTIONS    Therapeutic Exercise  - Sit to  stands with use of hands on knees using green chair with airex pad in seat - 2x5 - Standing Hip Extension with UE support x15 bilat.  - Standing Hip Abduction with UE support x10 bilat  - Mini Lunges in standing with UE support - 1X15 - Leg swings frontal and sagittal 1x15 bilat each  -[employer] 21 2x15  -Hurdles (high steps with 2 sec hold at top) x3 bilat. forward -3 Hurdles 11.5 inch forward and lateral steps x3 bilat -cone taps 6 cones each stack x5 per leg -Hip Machine Flexion 2x10 bilat. 10lbs  -SLS  Test: 6 sec -79mWT: 1.05 m/s    PT Education - 02/15/20 1304    Education Details form/technique with exercise    Person(s) Educated Patient    Methods Explanation;Demonstration    Comprehension Verbalized understanding;Returned demonstration            PT Short Term Goals - 10/26/19  1540      PT SHORT TERM GOAL #1   Title Patient will have a worst pain score of 6/10 to better be able to drive with less overall pain    Baseline 8/10; 10/26/2019: 6/10    Time 3    Period Weeks    Status On-going    Target Date 10/09/19             PT Long Term Goals - 02/15/20 1306      PT LONG TERM GOAL #1   Title Patient will be independent with HEP to continue benefits of therapy after discharge.     Baseline Dependent with form and technique for exercises; 10/26/2019: cueing needed to complete HEP; 12/06/2019:  performing exercises at home    Time 6    Period Weeks      PT LONG TERM GOAL #2   Title Patient will improve 64mWT to over 1 m/s to indicate increased safety with ambulating in a community environment.    Baseline .66 m/s; 10/26/2019: .75 m/s ; 12/06/2019: . 8 m/s; 01/16/20: .91 m/s; 02/15/20: 1.41m/s    Time 6    Period Weeks    Status Achieved      PT LONG TERM GOAL #3   Title Patient will improve FGA to 63 to indicate significant functional improvement of R hip function.    Baseline FGA: 44; 10/26/2019: FGA: 48; 5/192/2019: 53;    Time 6    Period Weeks    Status Deferred      PT LONG TERM GOAL #4   Title Patient will be able to balance for 10sec with SLS to improve static balance and ability to get dressed when in standing    Baseline 2.5 sec B; 10/26/2019: 2.5 sec; 12/06/2019: 3 sec; 01/16/20: 4 sec; 02/15/20 6sec    Time 6    Period Weeks    Status On-going      PT LONG TERM GOAL #5   Title Patient will have a worst hip pain of a 3/10 over the past week to indicate functional improvement with transferring and driving    Baseline worst pain: 8/10; 10/26/2019: 6/10; 12/06/2019: 3/10, 01/16/20: 0/10    Time 6    Period Weeks    Status Achieved      PT LONG TERM GOAL #6   Title Patient will improve LEFS score by 12 points to show an improvement in functional movements and mobility.    Baseline 46; 02/15/20: 40    Time 6    Period Weeks  Status On-going                  Plan - 02/15/20 1305    Clinical Impression Statement Patient has made progress which is demonstrated by increase in SLS time (4 sec to 6 sec) and achieving 50mWT with a speed of 1.32m/s. Patient had a decrease in LEFS score at this progress session. Patient tolerated new addition of exercises at today's session to focus on LE strength, mainly SLS.  Patient will continue to benefit from further skilled therapy to progress towards long-term goals and return to prior level of function.    Personal Factors and Comorbidities Age;Comorbidity 2    Comorbidities hip fx, RA    Examination-Activity Limitations Lift;Squat;Stand;Transfers    Stability/Clinical Decision Making Evolving/Moderate complexity    Clinical Decision Making Moderate    Rehab Potential Good    PT Frequency 2x / week    PT Duration 6 weeks    PT Treatment/Interventions Neuromuscular re-education;Stair training;Moist Heat;Cryotherapy;Iontophoresis 4mg /ml Dexamethasone;Electrical Stimulation;DME Instruction;Therapeutic activities;Therapeutic exercise;Balance training;Manual techniques;Patient/family education;Passive range of motion;Dry needling;Joint Manipulations    PT Next Visit Plan progress hip strengthening, use of SPC    PT Home Exercise Plan See education section    Consulted and Agree with Plan of Care Patient           Patient will benefit from skilled therapeutic intervention in order to improve the following deficits and impairments:  Abnormal gait, Decreased activity tolerance, Decreased endurance, Decreased range of motion, Decreased strength, Decreased balance, Difficulty walking, Decreased cognition, Pain, Decreased coordination, Increased muscle spasms, Postural dysfunction, Hypomobility  Visit Diagnosis: Unsteadiness on feet  History of falling  Pain in right hip  Muscle weakness (generalized)  Acute bilateral low back pain without sciatica     Problem List Patient Active Problem List    Diagnosis Date Noted  . DOE (dyspnea on exertion) 02/09/2020  . Lethargy 02/09/2020  . Anxiety 01/23/2020  . Closed right hip fracture ([place]) 09/20/2019  . Gout 05/18/2019  . Clavi 03/09/2019  . Right hip pain 07/08/2018  . Chronic bilateral low back pain without sciatica 12/31/2017  . Atrial fibrillation ([place]) 09/10/2017  . Trapezius strain, right, initial encounter 09/10/2017  . Depression, major, single episode, mild ([place]) 09/10/2017  . Chronic venous insufficiency 09/09/2017  . Lymphedema 09/09/2017  . Pain of toe of left foot 08/17/2017  . Leg swelling 07/31/2017  . Dry eyes 07/06/2017  . Pain and swelling of lower extremity, right 03/29/2017  . Right shoulder pain 03/29/2017  . Diarrhea 05/18/2016  . Fall 05/01/2016  . Leg weakness, bilateral 12/30/2015  . Macrocytic anemia 05/19/2013  . Psoriatic arthritis ([place]) 02/03/2013  . Right hand pain 08/05/2012  . Psoriasis   . Osteoarthritis, multiple sites   . History of breast cancer 10/28/2011  . GERD (gastroesophageal reflux disease)   . Hypertension   . Osteopenia   . Carotid stenosis 05/27/2011   1:53 PM, 02/15/20 Margarito Liner, SPT Student Physical Therapist [place]  [phone]  Margarito Liner 02/15/2020, 1:52 PM  [facility] [address], [place], [address] Phone: [phone]   Fax:  [phone]  Name: ARYAA BUNTING MRN: [id number] Date of Birth: Nov 03, 1940

## 2020-02-15 NOTE — Telephone Encounter (Signed)
-----   Message from [name], MD sent at 02/09/2020  4:21 PM EDT ----- Please let the patient know that there is no specific cause for her shortness of breath on exertion.  I would like to send her to cardiology for follow-up on that.  Her kidney function is mildly worse than her baseline.  Please find out how much water she is drinking.  Her MCV has gone up.  I know she has been evaluated by oncology in the past for that though given how high it is at this time I would recommend she go back to them to see if anything is changed.  I can refer her when she speak with her.

## 2020-02-16 ENCOUNTER — Encounter: Payer: Self-pay | Admitting: Family Medicine

## 2020-02-16 ENCOUNTER — Ambulatory Visit (INDEPENDENT_AMBULATORY_CARE_PROVIDER_SITE_OTHER): Payer: Medicare Other | Admitting: Family Medicine

## 2020-02-16 VITALS — BP 118/80 | HR 63 | Temp 98.2°F | Ht 66.0 in | Wt 119.0 lb

## 2020-02-16 DIAGNOSIS — R0600 Dyspnea, unspecified: Secondary | ICD-10-CM | POA: Diagnosis not present

## 2020-02-16 DIAGNOSIS — I6523 Occlusion and stenosis of bilateral carotid arteries: Secondary | ICD-10-CM

## 2020-02-16 DIAGNOSIS — R0609 Other forms of dyspnea: Secondary | ICD-10-CM

## 2020-02-16 DIAGNOSIS — D539 Nutritional anemia, unspecified: Secondary | ICD-10-CM | POA: Diagnosis not present

## 2020-02-16 DIAGNOSIS — I10 Essential (primary) hypertension: Secondary | ICD-10-CM

## 2020-02-16 LAB — BASIC METABOLIC PANEL
BUN: 21 mg/dL (ref 6–23)
CO2: 28 mEq/L (ref 19–32)
Calcium: 9.4 mg/dL (ref 8.4–10.5)
Chloride: 103 mEq/L (ref 96–112)
Creatinine, Ser: 1.09 mg/dL (ref 0.40–1.20)
GFR: 48.19 mL/min — ABNORMAL LOW (ref >60.00)
Glucose, Bld: 83 mg/dL (ref 70–99)
Potassium: 4.2 mEq/L (ref 3.5–5.1)
Sodium: 140 mEq/L (ref 135–145)

## 2020-02-16 LAB — VITAMIN B12: Vitamin B-12: 310 pg/mL (ref 211–911)

## 2020-02-16 LAB — FOLATE: Folate: >24.8 ng/mL (ref >5.9)

## 2020-02-16 NOTE — Assessment & Plan Note (Signed)
MCV has worsened.  We will check B12 and folate to rule out deficiencies as an underlying cause.  Discussed the potential need to see hematology again.

## 2020-02-16 NOTE — Assessment & Plan Note (Signed)
Much improved.  Continue current regimen.  Check BMP.

## 2020-02-16 NOTE — Progress Notes (Signed)
  [name], MD Phone: [phone]  [name] is a 79 y.o. female who presents today for f/u.  HYPERTENSION  Disease Monitoring  Home BP Monitoring not checking Chest pain- no    Dyspnea- stable, unchanged since our visit 1 week ago Medications  Compliance-  Taking losartan, lasix, metoprolol. Lightheadedness-  no  Edema- no  Macrocytic anemia: Noted on prior labs.  MCV is worsened.  This is a chronic issue and she has seen hematology in the distant past for this with a negative work-up.  Social History   Tobacco Use  Smoking Status Former Smoker  . Packs/day: 1.00  . Years: 40.00  . Pack years: 40.00  . Types: Cigarettes  . Quit date: 07/21/1995  . Years since quitting: 24.5  Smokeless Tobacco Never Used     ROS see history of present illness  Objective  Physical Exam Vitals:   02/16/20 1149  BP: 118/80  Pulse: 63  Temp: 98.2 F (36.8 C)  SpO2: 95%    BP Readings from Last 3 Encounters:  02/16/20 118/80  02/09/20 (!) 150/80  02/08/20 (!) 192/99   Wt Readings from Last 3 Encounters:  02/16/20 119 lb (54 kg)  02/09/20 118 lb 6.4 oz (53.7 kg)  02/08/20 116 lb (52.6 kg)    Physical Exam Constitutional:      General: She is not in acute distress.    Appearance: She is not diaphoretic.  Cardiovascular:     Rate and Rhythm: Normal rate and regular rhythm.     Heart sounds: Normal heart sounds.  Pulmonary:     Effort: Pulmonary effort is normal.     Breath sounds: Normal breath sounds.  Musculoskeletal:     Right lower leg: No edema.     Left lower leg: No edema.  Skin:    General: Skin is warm and dry.  Neurological:     Mental Status: She is alert.      Assessment/Plan: Please see individual problem list.  Hypertension Much improved.  Continue current regimen.  Check BMP.  Macrocytic anemia MCV has worsened.  We will check B12 and folate to rule out deficiencies as an underlying cause.  Discussed the potential need to see  hematology again.  DOE (dyspnea on exertion) Continues to be an issue.  Refer back to cardiology.   Orders Placed This Encounter  Procedures  . Basic Metabolic Panel (BMET)  . B12  . Folate  . Ambulatory referral to Cardiology    Referral Priority:   Routine    Referral Type:   Consultation    Referral Reason:   Specialty Services Required    Requested Specialty:   Cardiology    Number of Visits Requested:   1    No orders of the defined types were placed in this encounter.   This visit occurred during the SARS-CoV-2 public health emergency.  Safety protocols were in place, including screening questions prior to the visit, additional usage of staff PPE, and extensive cleaning of exam room while observing appropriate contact time as indicated for disinfecting solutions.    [name], MD [place]

## 2020-02-16 NOTE — Assessment & Plan Note (Signed)
Continues to be an issue.  Refer back to cardiology.

## 2020-02-16 NOTE — Patient Instructions (Signed)
Nice to see you. We will check lab work today and contact you with the results. I have referred you back to your cardiologist as well.

## 2020-02-20 ENCOUNTER — Telehealth: Payer: Self-pay

## 2020-02-20 NOTE — Telephone Encounter (Signed)
-----   Message from [name], MD sent at 02/19/2020 12:53 PM EDT ----- Please let the patient know that her labs did not reveal a cause for her worsening MCV. I would like for her to see hematology to evaluate this further. I can place a referral once you speak with her. Her kidney function is stable compared to her recent value and prior baseline.

## 2020-02-25 ENCOUNTER — Other Ambulatory Visit: Payer: Self-pay | Admitting: Internal Medicine

## 2020-02-26 ENCOUNTER — Other Ambulatory Visit: Payer: Self-pay

## 2020-02-26 ENCOUNTER — Ambulatory Visit: Payer: Medicare Other | Attending: Internal Medicine

## 2020-02-26 DIAGNOSIS — M25551 Pain in right hip: Secondary | ICD-10-CM | POA: Diagnosis not present

## 2020-02-26 DIAGNOSIS — Z9181 History of falling: Secondary | ICD-10-CM | POA: Insufficient documentation

## 2020-02-26 DIAGNOSIS — M545 Low back pain, unspecified: Secondary | ICD-10-CM

## 2020-02-26 DIAGNOSIS — R2681 Unsteadiness on feet: Secondary | ICD-10-CM | POA: Diagnosis not present

## 2020-02-26 DIAGNOSIS — M6281 Muscle weakness (generalized): Secondary | ICD-10-CM | POA: Insufficient documentation

## 2020-02-26 NOTE — Therapy (Signed)
[facility] [address] [place], [address] Phone: [phone]   Fax:  [phone]  Physical Therapy Treatment  Patient Details  Name: [name] MRN: [id number] Date of Birth: 1940/08/29 Referring Provider (PT): Tamala Julian   Encounter Date: 02/26/2020   PT End of Session - 02/26/20 1433    Visit Number 37    Number of Visits 40    Date for PT Re-Evaluation 02/13/20    Authorization Type 10/10    PT Start Time 1430    PT Stop Time 1515    PT Time Calculation (min) 45 min    Equipment Utilized During Treatment Gait belt    Activity Tolerance Patient tolerated treatment well;No increased pain    Behavior During Therapy WFL for tasks assessed/performed           Past Medical History:  Diagnosis Date  . (HFpEF) heart failure with preserved ejection fraction ([place])    a. 08/2017 Echo: EF 55-60%, no rwma, mild MR, mildly dil LA, nl RV fxn.  . Breast cancer ([place]) 2001   left breast  . Cancer ([place]) 2001   left breast ca  . Carotid arterial disease ([place])    a. 10/2004 s/p L CEA; b. 12/2015 Carotid U/S: RICA 1-39%; b. LICA patent CEA site.  Marland Kitchen GERD (gastroesophageal reflux disease)   . Hyperlipidemia   . Hypertension   . Osteoarthritis, multiple sites   . Osteopenia   . Persistent atrial fibrillation ([place])    a. Dx 08/2017; b. CHA2DS2VASc = 6-->Xarelto; c. 09/2017 Successful DCCV (second shock - 200J);   Marland Kitchen Personal history of radiation therapy 2001   left breast ca  . Psoriasis     Past Surgical History:  Procedure Laterality Date  . ABDOMINAL HYSTERECTOMY    . ANKLE FRACTURE SURGERY  4/08   left---hardware still in place  . 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 BREAST EXCISIONAL BIOPSY Left yrs ago   benign  . BREAST LUMPECTOMY Left 2001   f/u radiation  . CARDIOVERSION N/A 10/04/2017   Procedure: CARDIOVERSION;  Surgeon: Wellington Hampshire, MD;  Location: [facility] ORS;  Service:  Cardiovascular;  Laterality: N/A;  . CARDIOVERSION N/A 12/16/2017   Procedure: CARDIOVERSION;  Surgeon: [name], MD;  Location: [facility] ORS;  Service: Cardiovascular;  Laterality: N/A;  . CAROTID ENDARTERECTOMY Left 10/23/2004  . FRACTURE SURGERY    . OOPHORECTOMY    . SHOULDER SURGERY  6/07   left  . TONSILLECTOMY AND ADENOIDECTOMY    . TOTAL HIP ARTHROPLASTY  2004   right    There were no vitals filed for this visit.   Subjective Assessment - 02/26/20 1431    Subjective Patient reports shes having some trouble getting up after sitting down for around 30 minutes.    Pertinent History Patient previously seen for balance difficulties, shoulder pain and the right, and low back pain.  Patient reports no falls in the past six months. Patient reports pain has not improved for the past month and has been about the same since the onset of pain.    Limitations Sitting;Standing;Walking    Patient Stated Goals To improve balance when walking, driving pain    Currently in Pain? Yes    Pain Score 2     Pain Onset More than a month ago          INTERVENTIONS    Therapeutic Exercise  - Sit to  stands with use of hands on knees using green chair with airex pad in seat - 2x5 - Standing Hip Extension with UE support x15 bilat.  - Standing Hip Abduction with UE support x10 bilat  - Mini Lunges in standing with UE support - 1X15 - Leg swings frontal and sagittal 1x15 bilat each  -Total Gym Level 22.5 2x15  -2 Hurdles 12 inch apart forward steps x4 bilat -2 Hurdles 8 inch apart Lateral steps x4 bilat -Toe Taps on 12inch stepx10 per leg -Hip Machine Flexion 2x10 bilat. 10lbs  -SLS on AirEx pad x2 bilat 15sec  Performed exercises to improve LE strength, endurance, and gait     PT Education - 02/26/20 1433    Education Details form/technique with exercise    Person(s) Educated Patient    Methods Explanation;Demonstration    Comprehension Verbalized understanding;Returned demonstration             PT Short Term Goals - 10/26/19 1540      PT SHORT TERM GOAL #1   Title Patient will have a worst pain score of 6/10 to better be able to drive with less overall pain    Baseline 8/10; 10/26/2019: 6/10    Time 3    Period Weeks    Status On-going    Target Date 10/09/19             PT Long Term Goals - 02/15/20 1306      PT LONG TERM GOAL #1   Title Patient will be independent with HEP to continue benefits of therapy after discharge.     Baseline Dependent with form and technique for exercises; 10/26/2019: cueing needed to complete HEP; 12/06/2019:  performing exercises at home    Time 6    Period Weeks      PT LONG TERM GOAL #2   Title Patient will improve 76mWT to over 1 m/s to indicate increased safety with ambulating in a community environment.    Baseline .66 m/s; 10/26/2019: .75 m/s ; 12/06/2019: . 8 m/s; 01/16/20: .91 m/s; 02/15/20: 1.35m/s    Time 6    Period Weeks    Status Achieved      PT LONG TERM GOAL #3   Title Patient will improve FGA to 63 to indicate significant functional improvement of R hip function.    Baseline FGA: 44; 10/26/2019: FGA: 48; 5/192/2019: 53;    Time 6    Period Weeks    Status Deferred      PT LONG TERM GOAL #4   Title Patient will be able to balance for 10sec with SLS to improve static balance and ability to get dressed when in standing    Baseline 2.5 sec B; 10/26/2019: 2.5 sec; 12/06/2019: 3 sec; 01/16/20: 4 sec; 02/15/20 6sec    Time 6    Period Weeks    Status On-going      PT LONG TERM GOAL #5   Title Patient will have a worst hip pain of a 3/10 over the past week to indicate functional improvement with transferring and driving    Baseline worst pain: 8/10; 10/26/2019: 6/10; 12/06/2019: 3/10, 01/16/20: 0/10    Time 6    Period Weeks    Status Achieved      PT LONG TERM GOAL #6   Title Patient will improve LEFS score by 12 points to show an improvement in functional movements and mobility.    Baseline 46; 02/15/20: 40    Time 6  Period Weeks    Status On-going                 Plan - 02/26/20 1434    Clinical Impression Statement Continued working on LE strength and gait to focus on deficits that are limiting patient's daily activities.  Patients R hip flexion strength is improving but continues to be weaker than contralateral hip.  Patients SLS endurance is improving demonstrated by ability to perform forward and lateral steps without LOB and confidence in R hip is also improving.  Patient is progressing towards goals and will continue to benefit form skilled therapy to return to prior level of function.    Personal Factors and Comorbidities Age;Comorbidity 2    Comorbidities hip fx, RA    Examination-Activity Limitations Lift;Squat;Stand;Transfers    Stability/Clinical Decision Making Evolving/Moderate complexity    Clinical Decision Making Moderate    Rehab Potential Good    PT Frequency 2x / week    PT Duration 6 weeks    PT Treatment/Interventions Neuromuscular re-education;Stair training;Moist Heat;Cryotherapy;Iontophoresis 4mg /ml Dexamethasone;Electrical Stimulation;DME Instruction;Therapeutic activities;Therapeutic exercise;Balance training;Manual techniques;Patient/family education;Passive range of motion;Dry needling;Joint Manipulations    PT Next Visit Plan progress hip strengthening, use of SPC    PT Home Exercise Plan See education section    Consulted and Agree with Plan of Care Patient           Patient will benefit from skilled therapeutic intervention in order to improve the following deficits and impairments:  Abnormal gait, Decreased activity tolerance, Decreased endurance, Decreased range of motion, Decreased strength, Decreased balance, Difficulty walking, Decreased cognition, Pain, Decreased coordination, Increased muscle spasms, Postural dysfunction, Hypomobility  Visit Diagnosis: Unsteadiness on feet  History of falling  Pain in right hip  Muscle weakness  (generalized)  Acute bilateral low back pain without sciatica     Problem List Patient Active Problem List   Diagnosis Date Noted  . DOE (dyspnea on exertion) 02/09/2020  . Lethargy 02/09/2020  . Anxiety 01/23/2020  . Closed right hip fracture ([place]) 09/20/2019  . Gout 05/18/2019  . Clavi 03/09/2019  . Right hip pain 07/08/2018  . Chronic bilateral low back pain without sciatica 12/31/2017  . Atrial fibrillation ([place]) 09/10/2017  . Trapezius strain, right, initial encounter 09/10/2017  . Depression, major, single episode, mild ([place]) 09/10/2017  . Chronic venous insufficiency 09/09/2017  . Lymphedema 09/09/2017  . Pain of toe of left foot 08/17/2017  . Leg swelling 07/31/2017  . Dry eyes 07/06/2017  . Pain and swelling of lower extremity, right 03/29/2017  . Right shoulder pain 03/29/2017  . Diarrhea 05/18/2016  . Fall 05/01/2016  . Leg weakness, bilateral 12/30/2015  . Macrocytic anemia 05/19/2013  . Psoriatic arthritis ([place]) 02/03/2013  . Right hand pain 08/05/2012  . Psoriasis   . Osteoarthritis, multiple sites   . History of breast cancer 10/28/2011  . GERD (gastroesophageal reflux disease)   . Hypertension   . Osteopenia   . Carotid stenosis 05/27/2011   3:15 PM, 02/26/20 Margarito Liner, SPT Student Physical Therapist [place]  [phone]  Margarito Liner 02/26/2020, 3:05 PM  [facility] [address] [place], [address] Phone: [phone]   Fax:  [phone]  Name: TALEIGHA PINSON MRN: [id number] Date of Birth: [birth year]

## 2020-02-27 NOTE — Telephone Encounter (Signed)
This is a [facility] pt 

## 2020-03-05 ENCOUNTER — Other Ambulatory Visit: Payer: Self-pay

## 2020-03-05 ENCOUNTER — Ambulatory Visit: Payer: Medicare Other

## 2020-03-05 DIAGNOSIS — R2681 Unsteadiness on feet: Secondary | ICD-10-CM | POA: Diagnosis not present

## 2020-03-05 DIAGNOSIS — M545 Low back pain: Secondary | ICD-10-CM | POA: Diagnosis not present

## 2020-03-05 DIAGNOSIS — M25551 Pain in right hip: Secondary | ICD-10-CM | POA: Diagnosis not present

## 2020-03-05 DIAGNOSIS — Z9181 History of falling: Secondary | ICD-10-CM

## 2020-03-05 DIAGNOSIS — M6281 Muscle weakness (generalized): Secondary | ICD-10-CM | POA: Diagnosis not present

## 2020-03-05 NOTE — Therapy (Signed)
[facility] [address] [place], [address] Phone: [phone]   Fax:  [phone]  Physical Therapy Treatment  Patient Details  Name: [name] MRN: [id number] Date of Birth: May 23, 1941 Referring Provider (PT): Tamala Julian   Encounter Date: 03/05/2020   PT End of Session - 03/05/20 1047    Visit Number 38    Number of Visits 40    Date for PT Re-Evaluation 03/28/20    Authorization Type 3/10    PT Start Time 1030    PT Stop Time 1115    PT Time Calculation (min) 45 min    Equipment Utilized During Treatment Gait belt    Activity Tolerance Patient tolerated treatment well;No increased pain    Behavior During Therapy WFL for tasks assessed/performed           Past Medical History:  Diagnosis Date  . (HFpEF) heart failure with preserved ejection fraction ([place])    a. 08/2017 Echo: EF 55-60%, no rwma, mild MR, mildly dil LA, nl RV fxn.  . Breast cancer ([place]) 2001   left breast  . Cancer ([place]) 2001   left breast ca  . Carotid arterial disease ([place])    a. 10/2004 s/p L CEA; b. 12/2015 Carotid U/S: RICA 1-39%; b. LICA patent CEA site.  Marland Kitchen GERD (gastroesophageal reflux disease)   . Hyperlipidemia   . Hypertension   . Osteoarthritis, multiple sites   . Osteopenia   . Persistent atrial fibrillation ([place])    a. Dx 08/2017; b. CHA2DS2VASc = 6-->Xarelto; c. 09/2017 Successful DCCV (second shock - 200J);   Marland Kitchen Personal history of radiation therapy 2001   left breast ca  . Psoriasis     Past Surgical History:  Procedure Laterality Date  . ABDOMINAL HYSTERECTOMY    . ANKLE FRACTURE SURGERY  4/08   left---hardware still in place  . 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 BREAST EXCISIONAL BIOPSY Left yrs ago   benign  . BREAST LUMPECTOMY Left 2001   f/u radiation  . CARDIOVERSION N/A 10/04/2017   Procedure: CARDIOVERSION;  Surgeon: Wellington Hampshire, MD;  Location: [facility] ORS;  Service:  Cardiovascular;  Laterality: N/A;  . CARDIOVERSION N/A 12/16/2017   Procedure: CARDIOVERSION;  Surgeon: [name], MD;  Location: [facility] ORS;  Service: Cardiovascular;  Laterality: N/A;  . CAROTID ENDARTERECTOMY Left 10/23/2004  . FRACTURE SURGERY    . OOPHORECTOMY    . SHOULDER SURGERY  6/07   left  . TONSILLECTOMY AND ADENOIDECTOMY    . TOTAL HIP ARTHROPLASTY  2004   right    There were no vitals filed for this visit.   Subjective Assessment - 03/05/20 1039    Subjective Patient states no major changes since the previous session. Patient states she has been improving.    Pertinent History Patient previously seen for balance difficulties, shoulder pain and the right, and low back pain.  Patient reports no falls in the past six months. Patient reports pain has not improved for the past month and has been about the same since the onset of pain.    Limitations Sitting;Standing;Walking    Patient Stated Goals To improve balance when walking, driving pain    Currently in Pain? Yes    Pain Score 2     Pain Location Back    Pain Orientation Right    Pain Onset More than a month ago  INTERVENTIONS    Therapeutic Exercise  -Total Gym Level 26 2x15  - Leg swings frontal and sagittal 2x15 bilat each  - Standing Hip Extension with UE support x15 bilat with RTB - Standing Hip Abduction with UE support x15 bilat with RTB - Step ups onto 4" step with UE support - x 10 B; unilateral UE support - x 10 B; 1 digit UE support - x 5 *Add to NV* - Cone taps from airex pad onto 7 cones with a 4" step forwards and laterally  Step overs 10" hurdles to improve balance   Performed exercises to improve LE strength, endurance, and gait     PT Education - 03/05/20 1046    Education Details form/technique with exercise    Person(s) Educated Patient    Methods Explanation;Demonstration    Comprehension Verbalized understanding;Returned demonstration            PT Short Term Goals  - 10/26/19 1540      PT SHORT TERM GOAL #1   Title Patient will have a worst pain score of 6/10 to better be able to drive with less overall pain    Baseline 8/10; 10/26/2019: 6/10    Time 3    Period Weeks    Status On-going    Target Date 10/09/19             PT Long Term Goals - 02/15/20 1306      PT LONG TERM GOAL #1   Title Patient will be independent with HEP to continue benefits of therapy after discharge.     Baseline Dependent with form and technique for exercises; 10/26/2019: cueing needed to complete HEP; 12/06/2019:  performing exercises at home    Time 6    Period Weeks      PT LONG TERM GOAL #2   Title Patient will improve 67mWT to over 1 m/s to indicate increased safety with ambulating in a community environment.    Baseline .66 m/s; 10/26/2019: .75 m/s ; 12/06/2019: . 8 m/s; 01/16/20: .91 m/s; 02/15/20: 1.6m/s    Time 6    Period Weeks    Status Achieved      PT LONG TERM GOAL #3   Title Patient will improve FGA to 63 to indicate significant functional improvement of R hip function.    Baseline FGA: 44; 10/26/2019: FGA: 48; 5/192/2019: 53;    Time 6    Period Weeks    Status Deferred      PT LONG TERM GOAL #4   Title Patient will be able to balance for 10sec with SLS to improve static balance and ability to get dressed when in standing    Baseline 2.5 sec B; 10/26/2019: 2.5 sec; 12/06/2019: 3 sec; 01/16/20: 4 sec; 02/15/20 6sec    Time 6    Period Weeks    Status On-going      PT LONG TERM GOAL #5   Title Patient will have a worst hip pain of a 3/10 over the past week to indicate functional improvement with transferring and driving    Baseline worst pain: 8/10; 10/26/2019: 6/10; 12/06/2019: 3/10, 01/16/20: 0/10    Time 6    Period Weeks    Status Achieved      PT LONG TERM GOAL #6   Title Patient will improve LEFS score by 12 points to show an improvement in functional movements and mobility.    Baseline 46; 02/15/20: 40    Time 6    Period Weeks  Status  On-going                 Plan - 03/05/20 1053    Clinical Impression Statement Continued to address hip weakness and static/dynamic balance throughout today's session, patient requires unilateral UE however, does not require to load the UE to maintain balance. Patient mostly uses UE secondary to a lack of confidence in balance. Patient is improving overall and will benefit ffrom further skiled therapy to return to prior level of function.    Personal Factors and Comorbidities Age;Comorbidity 2    Comorbidities hip fx, RA    Examination-Activity Limitations Lift;Squat;Stand;Transfers    Stability/Clinical Decision Making Evolving/Moderate complexity    Rehab Potential Good    PT Frequency 2x / week    PT Duration 6 weeks    PT Treatment/Interventions Neuromuscular re-education;Stair training;Moist Heat;Cryotherapy;Iontophoresis 4mg /ml Dexamethasone;Electrical Stimulation;DME Instruction;Therapeutic activities;Therapeutic exercise;Balance training;Manual techniques;Patient/family education;Passive range of motion;Dry needling;Joint Manipulations    PT Next Visit Plan progress hip strengthening, use of SPC    PT Home Exercise Plan See education section    Consulted and Agree with Plan of Care Patient           Patient will benefit from skilled therapeutic intervention in order to improve the following deficits and impairments:  Abnormal gait, Decreased activity tolerance, Decreased endurance, Decreased range of motion, Decreased strength, Decreased balance, Difficulty walking, Decreased cognition, Pain, Decreased coordination, Increased muscle spasms, Postural dysfunction, Hypomobility  Visit Diagnosis: Unsteadiness on feet  History of falling     Problem List Patient Active Problem List   Diagnosis Date Noted  . DOE (dyspnea on exertion) 02/09/2020  . Lethargy 02/09/2020  . Anxiety 01/23/2020  . Closed right hip fracture ([place]) 09/20/2019  . Gout 05/18/2019  . Clavi  03/09/2019  . Right hip pain 07/08/2018  . Chronic bilateral low back pain without sciatica 12/31/2017  . Atrial fibrillation ([place]) 09/10/2017  . Trapezius strain, right, initial encounter 09/10/2017  . Depression, major, single episode, mild ([place]) 09/10/2017  . Chronic venous insufficiency 09/09/2017  . Lymphedema 09/09/2017  . Pain of toe of left foot 08/17/2017  . Leg swelling 07/31/2017  . Dry eyes 07/06/2017  . Pain and swelling of lower extremity, right 03/29/2017  . Right shoulder pain 03/29/2017  . Diarrhea 05/18/2016  . Fall 05/01/2016  . Leg weakness, bilateral 12/30/2015  . Macrocytic anemia 05/19/2013  . Psoriatic arthritis ([place]) 02/03/2013  . Right hand pain 08/05/2012  . Psoriasis   . Osteoarthritis, multiple sites   . History of breast cancer 10/28/2011  . GERD (gastroesophageal reflux disease)   . Hypertension   . Osteopenia   . Carotid stenosis 05/27/2011    Blythe Stanford, PT DPT 03/05/2020, 11:19 AM  [facility] [address] [place], [address] Phone: [phone]   Fax:  [phone]  Name: DORLEEN KISSEL MRN: [id number] Date of Birth: 30-Nov-1940

## 2020-03-07 ENCOUNTER — Other Ambulatory Visit: Payer: Self-pay

## 2020-03-07 ENCOUNTER — Encounter: Payer: Self-pay | Admitting: Nurse Practitioner

## 2020-03-07 ENCOUNTER — Ambulatory Visit: Payer: Medicare Other

## 2020-03-07 ENCOUNTER — Ambulatory Visit (INDEPENDENT_AMBULATORY_CARE_PROVIDER_SITE_OTHER): Payer: Medicare Other | Admitting: Nurse Practitioner

## 2020-03-07 VITALS — BP 134/80 | HR 60 | Ht 66.0 in | Wt 116.0 lb

## 2020-03-07 DIAGNOSIS — I6523 Occlusion and stenosis of bilateral carotid arteries: Secondary | ICD-10-CM | POA: Diagnosis not present

## 2020-03-07 DIAGNOSIS — R0609 Other forms of dyspnea: Secondary | ICD-10-CM

## 2020-03-07 DIAGNOSIS — Z9181 History of falling: Secondary | ICD-10-CM | POA: Diagnosis not present

## 2020-03-07 DIAGNOSIS — M25551 Pain in right hip: Secondary | ICD-10-CM

## 2020-03-07 DIAGNOSIS — R2681 Unsteadiness on feet: Secondary | ICD-10-CM | POA: Diagnosis not present

## 2020-03-07 DIAGNOSIS — I5032 Chronic diastolic (congestive) heart failure: Secondary | ICD-10-CM

## 2020-03-07 DIAGNOSIS — R0600 Dyspnea, unspecified: Secondary | ICD-10-CM

## 2020-03-07 DIAGNOSIS — M545 Low back pain: Secondary | ICD-10-CM | POA: Diagnosis not present

## 2020-03-07 DIAGNOSIS — I10 Essential (primary) hypertension: Secondary | ICD-10-CM

## 2020-03-07 DIAGNOSIS — I4819 Other persistent atrial fibrillation: Secondary | ICD-10-CM | POA: Diagnosis not present

## 2020-03-07 DIAGNOSIS — M6281 Muscle weakness (generalized): Secondary | ICD-10-CM | POA: Diagnosis not present

## 2020-03-07 NOTE — Progress Notes (Signed)
Office Visit    Patient Name: [name] Date of Encounter: 03/07/2020  Primary Care Provider:  [name], MD Primary Cardiologist:  Virl Axe, MD  Chief Complaint    79 year old female with history of persistent atrial fibrillation, HFpEF, left breast cancer, carotid arterial disease, GERD, hypertension, and hyperlipidemia, who presents for follow-up due to DOE.  Past Medical History    Past Medical History:  Diagnosis Date  . (HFpEF) heart failure with preserved ejection fraction ([place])    a. 08/2017 Echo: EF 55-60%, no rwma, mild MR, mildly dil LA, nl RV fxn.  . Breast cancer ([place]) 2001   left breast  . Cancer ([place]) 2001   left breast ca  . Carotid arterial disease ([place])    a. 10/2004 s/p L CEA; b. 12/2015 Carotid U/S: RICA 1-39%; b. LICA patent CEA site.  Marland Kitchen GERD (gastroesophageal reflux disease)   . Hyperlipidemia   . Hypertension   . Osteoarthritis, multiple sites   . Osteopenia   . Persistent atrial fibrillation ([place])    a. Dx 08/2017; b. CHA2DS2VASc = 6-->Pradaxa; c. 09/2017 Successful DCCV (second shock - 200J); d. 10/2017 Recurrent Afib-->flecainide started 11/2017.  Marland Kitchen Personal history of radiation therapy 2001   left breast ca  . Psoriasis    Past Surgical History:  Procedure Laterality Date  . ABDOMINAL HYSTERECTOMY    . ANKLE FRACTURE SURGERY  4/08   left---hardware still in place  . 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 BREAST EXCISIONAL BIOPSY Left yrs ago   benign  . BREAST LUMPECTOMY Left 2001   f/u radiation  . CARDIOVERSION N/A 10/04/2017   Procedure: CARDIOVERSION;  Surgeon: Wellington Hampshire, MD;  Location: [facility] ORS;  Service: Cardiovascular;  Laterality: N/A;  . CARDIOVERSION N/A 12/16/2017   Procedure: CARDIOVERSION;  Surgeon: [name], MD;  Location: [facility] ORS;  Service: Cardiovascular;  Laterality: N/A;  . CAROTID ENDARTERECTOMY Left 10/23/2004  . FRACTURE SURGERY    . OOPHORECTOMY    . SHOULDER SURGERY  6/07   left  .  TONSILLECTOMY AND ADENOIDECTOMY    . TOTAL HIP ARTHROPLASTY  2004   right    Allergies  No Known Allergies  History of Present Illness    79 year old female with the above past medical history including hypertension, hyperlipidemia, carotid arterial disease status post left carotid endarterectomy, left breast cancer, arthritis, and osteopenia.  In February 2019, she was admitted to [facility] with progressive lower extremity swelling and dyspnea times several months.  She was found in rapid atrial fibrillation and was placed on beta-blocker diltiazem therapy, as well as Xarelto.  Echocardiogram showed normal LV function and she was subsequently discharged following diuresis and adequate rate control.  She subsequently underwent outpatient cardioversion in March 2019 but was back in A. fib at follow-up in April 2019.  She was subsequently placed on flecainide.  Xarelto therapy has since been switched to dabigatran.  She was last seen in cardiology clinic in May of this year, at which time she was doing well without any significant palpitations.  She subsequently was seen by primary care on July 30 and reported dyspnea exertion x1 week.  Lab work July 23 was relatively stable (H&H 11.3/34.5, MCV elevated, B12 normal.).  Due to dyspnea, she was referred back to cardiology.  She notes that ever since breaking her pelvis following a fall in December 2020, activity has been more limited.  She has been working with physical therapy  and actually graduates today.  Despite physical therapy, she still has significant gait instability and notes that walking can sometimes be a struggle with resultant dyspnea on exertion.  She is accustomed to being very active and even used to play pickle ball but is disappointed to see that that is no longer an option for her.  She has not had any dyspnea at rest, PND, orthopnea, edema, or early satiety.  She denies chest pain.  She simply notes reduction in exercise  tolerance despite physical therapy.  Home Medications    Prior to Admission medications   Medication Sig Start Date End Date Taking? Authorizing Provider  allopurinol (ZYLOPRIM) 100 MG tablet Take 100 mg by mouth daily. 04/06/19   [provider]  Cholecalciferol (VITAMIN D3) 1000 units CAPS Take 1 capsule by mouth 2 (two) times daily.     [provider]  flecainide (TAMBOCOR) 50 MG tablet TAKE 1 TABLET BY MOUTH TWICE A DAY 02/27/20   [name], MD  furosemide (LASIX) 40 MG tablet TAKE 1 TABLET BY MOUTH EVERY DAY 12/13/19   [name], MD  LORazepam (ATIVAN) 0.5 MG tablet TAKE 1 TABLET BY MOUTH TWICE A DAY AS NEEDED FOR ANXIETY 12/01/19   [name], MD  losartan (COZAAR) 50 MG tablet Take 1 tablet (50 mg total) by mouth daily. 02/09/20   [name], MD  metoprolol tartrate (LOPRESSOR) 50 MG tablet Take 1/2 tablet (25 mg) by mouth twice daily 01/03/20   [name], MD  mirtazapine (REMERON) 15 MG tablet Take 15 mg by mouth at bedtime. 12/18/19   [provider]  Multiple Vitamin (MULTIVITAMIN) capsule Take 1 capsule by mouth daily.      [provider]  Multiple Vitamins-Minerals (PRESERVISION AREDS 2) CAPS Take 1 tablet by mouth 2 (two) times daily.    [provider]  PRADAXA 150 MG CAPS capsule TAKE 1 CAPSULE BY MOUTH TWICE A DAY 02/07/20   Charlie Pitter, PA-C    Review of Systems    Dyspnea exertion in the setting of very unsteady gait.  She has fallen due to missteps but notes that this is infrequent and she has not suffered any significant trauma other than the broken fracture in December.  She denies chest pain, palpitations, PND, orthopnea, dizziness, syncope, edema, or early satiety.  All other systems reviewed and are otherwise negative except as noted above.  Physical Exam    VS:  BP 134/80   Pulse 60   Ht 5\' 6"  (1.676 m)   Wt 116 lb (52.6 kg)   SpO2 99%   BMI 18.72 kg/m  , BMI Body mass index is 18.72  kg/m. GEN: Well nourished, well developed, in no acute distress. HEENT: normal. Neck: Supple, no JVD, carotid bruits, or masses. Cardiac: RRR, no murmurs, rubs, or gallops. No clubbing, cyanosis, edema.  Radials/PT 1+ and equal bilaterally.  Respiratory:  Respirations regular and unlabored, clear to auscultation bilaterally. GI: Soft, nontender, nondistended, BS + x 4. MS: no deformity or atrophy. Skin: warm and dry, no rash. Neuro:  Strength and sensation are intact. Psych: Normal affect.  Accessory Clinical Findings    ECG personally reviewed by me today -sinus arrhythmia, 60, first-degree AV block LVH,?  Septal infarct- no acute changes.  Lab Results  Component Value Date   WBC 5.2 02/09/2020   HGB 11.3 (L) 02/09/2020   HCT 34.5 (L) 02/09/2020   MCV 113.5 Repeated and verified X2. (H) 02/09/2020  PLT 282.0 02/09/2020   Lab Results  Component Value Date   CREATININE 1.09 02/16/2020   BUN 21 02/16/2020   NA 140 02/16/2020   K 4.2 02/16/2020   CL 103 02/16/2020   CO2 28 02/16/2020   Lab Results  Component Value Date   ALT 8 02/09/2020   AST 15 02/09/2020   ALKPHOS 88 02/09/2020   BILITOT 0.5 02/09/2020   Lab Results  Component Value Date   CHOL 216 (H) 01/11/2019   HDL 88.90 01/11/2019   LDLCALC 104 (H) 01/11/2019   LDLDIRECT 151.4 02/01/2012   TRIG 116.0 01/11/2019   CHOLHDL 2 01/11/2019    Lab Results  Component Value Date   HGBA1C 5.3 12/18/2014    Assessment & Plan    1.  Dyspnea on exertion/HFpEF: Patient with a history of unsteady gait with fall and pelvic fracture in December 2020.  She is working with physical therapy since her fall and actually graduates from PT today.  Despite physical therapy, she still notes an unsteady gait which results in increased work of walking and dyspnea on exertion.  She has not had any chest pain or other heart failure symptoms.  Heart rate and blood pressure stable.  Euvolemic on examination.  Though I suspect  deconditioning is primarily at play here, I will arrange for an echocardiogram to reevaluate LV function and rule out valvular abnormalities.  Provided this is within normal limits, in the absence of chest pain, would not pursue ischemic testing at this time, she had a negative study 2018.  2.  Persistent atrial fibrillation: Maintaining sinus rhythm on flecainide therapy.  She denies any palpitations.  She remains anticoagulated with dabigatran.  Recent lab work showed stable H&H with elevated MCV (normal B12).  3.  Essential hypertension: Relatively stable on beta-blocker and ARB therapy.  4.  Hyperlipidemia: LDL 104 last June.  Previously refused statins.  5.  Disposition: Follow-up echocardiogram as above.  Follow-up in clinic in 3 months or sooner if necessary.   Murray Hodgkins, NP 03/07/2020, 9:53 AM

## 2020-03-07 NOTE — Patient Instructions (Signed)
Medication Instructions:  Your physician recommends that you continue on your current medications as directed. Please refer to the Current Medication list given to you today.  *If you need a refill on your cardiac medications before your next appointment, please call your pharmacy*   Lab Work: None ordered If you have labs (blood work) drawn today and your tests are completely normal, you will receive your results only by: Marland Kitchen MyChart Message (if you have MyChart) OR . A paper copy in the mail If you have any lab test that is abnormal or we need to change your treatment, we will call you to review the results.   Testing/Procedures: Echo  [facility] to [facility] on ______________ at _______________ AM/PM for an Echocardiogram. Your physician has requested that you have an echocardiogram. Echocardiography is a painless test that uses sound waves to create images of your heart. It provides your doctor with information about the size and shape of your heart and how well your heart's chambers and valves are working. This procedure takes approximately one hour. There are no restrictions for this procedure. Please note; depending on visual quality an IV may need to be placed.    Follow-Up: At [facility], you and your health needs are our priority.  As part of our continuing mission to provide you with exceptional heart care, we have created designated Provider Care Teams.  These Care Teams include your primary Cardiologist (physician) and Advanced Practice Providers (APPs -  Physician Assistants and Nurse Practitioners) who all work together to provide you with the care you need, when you need it.  We recommend signing up for the patient portal called "MyChart".  Sign up information is provided on this After Visit Summary.  MyChart is used to connect with patients for Virtual Visits (Telemedicine).  Patients are able to view lab/test results, encounter notes, upcoming  appointments, etc.  Non-urgent messages can be sent to your provider as well.   To learn more about what you can do with MyChart, go to [email or web address].    Your next appointment:   3 month(s)  The format for your next appointment:   In Person  Provider:    You may see Virl Axe, MD or Murray Hodgkins, NP

## 2020-03-07 NOTE — Therapy (Signed)
[facility] [address] [place], [address] Phone: [phone]   Fax:  [phone]  Physical Therapy Treatment  Patient Details  Name: [name] MRN: [id number] Date of Birth: Dec 05, 1940 Referring Provider (PT): Tamala Julian   Encounter Date: 03/07/2020   PT End of Session - 03/07/20 1608    Visit Number 39    Number of Visits 46    Date for PT Re-Evaluation 03/28/20    Authorization Type 3/10    PT Start Time 1600    PT Stop Time 1645    PT Time Calculation (min) 45 min    Equipment Utilized During Treatment Gait belt    Activity Tolerance Patient tolerated treatment well;No increased pain    Behavior During Therapy WFL for tasks assessed/performed           Past Medical History:  Diagnosis Date  . (HFpEF) heart failure with preserved ejection fraction ([place])    a. 08/2017 Echo: EF 55-60%, no rwma, mild MR, mildly dil LA, nl RV fxn.  . Breast cancer ([place]) 2001   left breast  . Cancer ([place]) 2001   left breast ca  . Carotid arterial disease ([place])    a. 10/2004 s/p L CEA; b. 12/2015 Carotid U/S: RICA 1-39%; b. LICA patent CEA site.  Marland Kitchen GERD (gastroesophageal reflux disease)   . Hyperlipidemia   . Hypertension   . Osteoarthritis, multiple sites   . Osteopenia   . Persistent atrial fibrillation ([place])    a. Dx 08/2017; b. CHA2DS2VASc = 6-->Pradaxa; c. 09/2017 Successful DCCV (second shock - 200J); d. 10/2017 Recurrent Afib-->flecainide started 11/2017.  Marland Kitchen Personal history of radiation therapy 2001   left breast ca  . Psoriasis     Past Surgical History:  Procedure Laterality Date  . ABDOMINAL HYSTERECTOMY    . ANKLE FRACTURE SURGERY  4/08   left---hardware still in place  . 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 BREAST EXCISIONAL BIOPSY Left yrs ago   benign  . BREAST LUMPECTOMY Left 2001   f/u radiation  . CARDIOVERSION N/A 10/04/2017   Procedure: CARDIOVERSION;  Surgeon: Wellington Hampshire, MD;  Location: [facility] ORS;  Service: Cardiovascular;  Laterality: N/A;  . CARDIOVERSION N/A 12/16/2017   Procedure: CARDIOVERSION;  Surgeon: [name], MD;  Location: [facility] ORS;  Service: Cardiovascular;  Laterality: N/A;  . CAROTID ENDARTERECTOMY Left 10/23/2004  . FRACTURE SURGERY    . OOPHORECTOMY    . SHOULDER SURGERY  6/07   left  . TONSILLECTOMY AND ADENOIDECTOMY    . TOTAL HIP ARTHROPLASTY  2004   right    There were no vitals filed for this visit.   Subjective Assessment - 03/07/20 1607    Subjective Patient reports she is feeling stronger and more confident with performing balancing activities. Patient states she is improving.    Pertinent History Patient previously seen for balance difficulties, shoulder pain and the right, and low back pain.  Patient reports no falls in the past six months. Patient reports pain has not improved for the past month and has been about the same since the onset of pain.    Limitations Sitting;Standing;Walking    Patient Stated Goals To improve balance when walking, driving pain    Currently in Pain? Yes    Pain Score 2     Pain Location Back    Pain Orientation Right    Pain Descriptors / Indicators Aching  Pain Type Acute pain    Pain Onset More than a month ago    Pain Frequency Intermittent                Therapeutic Exercise  -Total Gym Level 26 2x20  - Leg swings frontal and sagittal 2x15 bilat each  - Step ups onto 4" step with UE support - x 10 B; unilateral UE support - x 10 B; 1 digit UE support - 2 x 5 *Add to NV* - Lunges in standing - 2 x 20  - Standing SLS with hip rotations - 2 x 20  Hip flexion at hip machine - 2 x 10 B 25# Hip abduction at hip machine - 2 x 10 B 25# Side stepping across airex beam - 4 x 5 ft     Performed exercises to improve LE strength, endurance, and gait    PT Education - 03/07/20 1608    Education Details form/technique with exercise    Person(s) Educated Patient     Methods Explanation;Demonstration    Comprehension Verbalized understanding;Returned demonstration            PT Short Term Goals - 10/26/19 1540      PT SHORT TERM GOAL #1   Title Patient will have a worst pain score of 6/10 to better be able to drive with less overall pain    Baseline 8/10; 10/26/2019: 6/10    Time 3    Period Weeks    Status On-going    Target Date 10/09/19             PT Long Term Goals - 02/15/20 1306      PT LONG TERM GOAL #1   Title Patient will be independent with HEP to continue benefits of therapy after discharge.     Baseline Dependent with form and technique for exercises; 10/26/2019: cueing needed to complete HEP; 12/06/2019:  performing exercises at home    Time 6    Period Weeks      PT LONG TERM GOAL #2   Title Patient will improve 44mWT to over 1 m/s to indicate increased safety with ambulating in a community environment.    Baseline .66 m/s; 10/26/2019: .75 m/s ; 12/06/2019: . 8 m/s; 01/16/20: .91 m/s; 02/15/20: 1.61m/s    Time 6    Period Weeks    Status Achieved      PT LONG TERM GOAL #3   Title Patient will improve FGA to 63 to indicate significant functional improvement of R hip function.    Baseline FGA: 44; 10/26/2019: FGA: 48; 5/192/2019: 53;    Time 6    Period Weeks    Status Deferred      PT LONG TERM GOAL #4   Title Patient will be able to balance for 10sec with SLS to improve static balance and ability to get dressed when in standing    Baseline 2.5 sec B; 10/26/2019: 2.5 sec; 12/06/2019: 3 sec; 01/16/20: 4 sec; 02/15/20 6sec    Time 6    Period Weeks    Status On-going      PT LONG TERM GOAL #5   Title Patient will have a worst hip pain of a 3/10 over the past week to indicate functional improvement with transferring and driving    Baseline worst pain: 8/10; 10/26/2019: 6/10; 12/06/2019: 3/10, 01/16/20: 0/10    Time 6    Period Weeks    Status Achieved      PT LONG TERM  GOAL #6   Title Patient will improve LEFS score by 12 points  to show an improvement in functional movements and mobility.    Baseline 46; 02/15/20: 40    Time 6    Period Weeks    Status On-going                 Plan - 03/07/20 1623    Clinical Impression Statement COntinued to focus on improving LE strength and SLS balance/stability to decrease fall risk and improve ability to perform functional tasks. Improvement with squatting at total gym as she was able to perform greater amount of repetitions compared to previous sessions. Although patient is improving, she continues to have increased difficulties with performing sit to stands through greater AROM and performing movement quickly. Patient will benefit form furhter skilled therapy to return to prior level of function.    Personal Factors and Comorbidities Age;Comorbidity 2    Comorbidities hip fx, RA    Examination-Activity Limitations Lift;Squat;Stand;Transfers    Stability/Clinical Decision Making Evolving/Moderate complexity    Rehab Potential Good    PT Frequency 2x / week    PT Duration 6 weeks    PT Treatment/Interventions Neuromuscular re-education;Stair training;Moist Heat;Cryotherapy;Iontophoresis 4mg /ml Dexamethasone;Electrical Stimulation;DME Instruction;Therapeutic activities;Therapeutic exercise;Balance training;Manual techniques;Patient/family education;Passive range of motion;Dry needling;Joint Manipulations    PT Next Visit Plan progress hip strengthening, use of SPC    PT Home Exercise Plan See education section    Consulted and Agree with Plan of Care Patient           Patient will benefit from skilled therapeutic intervention in order to improve the following deficits and impairments:  Abnormal gait, Decreased activity tolerance, Decreased endurance, Decreased range of motion, Decreased strength, Decreased balance, Difficulty walking, Decreased cognition, Pain, Decreased coordination, Increased muscle spasms, Postural dysfunction, Hypomobility  Visit  Diagnosis: Unsteadiness on feet  History of falling  Pain in right hip     Problem List Patient Active Problem List   Diagnosis Date Noted  . DOE (dyspnea on exertion) 02/09/2020  . Lethargy 02/09/2020  . Anxiety 01/23/2020  . Closed right hip fracture ([place]) 09/20/2019  . Gout 05/18/2019  . Clavi 03/09/2019  . Right hip pain 07/08/2018  . Chronic bilateral low back pain without sciatica 12/31/2017  . Atrial fibrillation ([place]) 09/10/2017  . Trapezius strain, right, initial encounter 09/10/2017  . Depression, major, single episode, mild ([place]) 09/10/2017  . Chronic venous insufficiency 09/09/2017  . Lymphedema 09/09/2017  . Pain of toe of left foot 08/17/2017  . Leg swelling 07/31/2017  . Dry eyes 07/06/2017  . Pain and swelling of lower extremity, right 03/29/2017  . Right shoulder pain 03/29/2017  . Diarrhea 05/18/2016  . Fall 05/01/2016  . Leg weakness, bilateral 12/30/2015  . Macrocytic anemia 05/19/2013  . Psoriatic arthritis ([place]) 02/03/2013  . Right hand pain 08/05/2012  . Psoriasis   . Osteoarthritis, multiple sites   . History of breast cancer 10/28/2011  . GERD (gastroesophageal reflux disease)   . Hypertension   . Osteopenia   . Carotid stenosis 05/27/2011    Blythe Stanford, PT DPT 03/07/2020, 5:06 PM  [facility] [address], [place], [address] Phone: [phone]   Fax:  [phone]  Name: [name] MRN: [id number] Date of Birth: 08-09-1940

## 2020-03-12 ENCOUNTER — Other Ambulatory Visit: Payer: Self-pay

## 2020-03-12 ENCOUNTER — Ambulatory Visit: Payer: Medicare Other

## 2020-03-12 DIAGNOSIS — M545 Low back pain, unspecified: Secondary | ICD-10-CM

## 2020-03-12 DIAGNOSIS — M6281 Muscle weakness (generalized): Secondary | ICD-10-CM

## 2020-03-12 DIAGNOSIS — R2681 Unsteadiness on feet: Secondary | ICD-10-CM | POA: Diagnosis not present

## 2020-03-12 DIAGNOSIS — M25551 Pain in right hip: Secondary | ICD-10-CM

## 2020-03-12 DIAGNOSIS — Z9181 History of falling: Secondary | ICD-10-CM | POA: Diagnosis not present

## 2020-03-12 NOTE — Therapy (Signed)
[facility] [address], [place], [address] Phone: [phone]   Fax:  [phone]  Physical Therapy Treatment  Patient Details  Name: [name] MRN: [id number] Date of Birth: [birth year] Referring Provider (PT): Tamala Julian   Encounter Date: 03/12/2020   PT End of Session - 03/12/20 1304    Visit Number 40    Number of Visits 46    Date for PT Re-Evaluation 03/28/20    Authorization Type 4/10    PT Start Time 1300    PT Stop Time 1345    PT Time Calculation (min) 45 min    Equipment Utilized During Treatment Gait belt    Activity Tolerance Patient tolerated treatment well;No increased pain    Behavior During Therapy WFL for tasks assessed/performed           Past Medical History:  Diagnosis Date  . (HFpEF) heart failure with preserved ejection fraction ([place])    a. 08/2017 Echo: EF 55-60%, no rwma, mild MR, mildly dil LA, nl RV fxn.  . Breast cancer ([place]) 2001   left breast  . Cancer ([place]) 2001   left breast ca  . Carotid arterial disease ([place])    a. 10/2004 s/p L CEA; b. 12/2015 Carotid U/S: RICA 1-39%; b. LICA patent CEA site.  Marland Kitchen GERD (gastroesophageal reflux disease)   . Hyperlipidemia   . Hypertension   . Osteoarthritis, multiple sites   . Osteopenia   . Persistent atrial fibrillation ([place])    a. Dx 08/2017; b. CHA2DS2VASc = 6-->Pradaxa; c. 09/2017 Successful DCCV (second shock - 200J); d. 10/2017 Recurrent Afib-->flecainide started 11/2017.  Marland Kitchen Personal history of radiation therapy 2001   left breast ca  . Psoriasis     Past Surgical History:  Procedure Laterality Date  . ABDOMINAL HYSTERECTOMY    . ANKLE FRACTURE SURGERY  4/08   left---hardware still in place  . 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 BREAST EXCISIONAL BIOPSY Left yrs ago   benign  . BREAST LUMPECTOMY Left 2001   f/u radiation  . CARDIOVERSION N/A 10/04/2017   Procedure: CARDIOVERSION;  Surgeon: Wellington Hampshire, MD;  Location: [facility] ORS;  Service: Cardiovascular;  Laterality: N/A;  . CARDIOVERSION N/A 12/16/2017   Procedure: CARDIOVERSION;  Surgeon: [name], MD;  Location: [facility] ORS;  Service: Cardiovascular;  Laterality: N/A;  . CAROTID ENDARTERECTOMY Left 10/23/2004  . FRACTURE SURGERY    . OOPHORECTOMY    . SHOULDER SURGERY  6/07   left  . TONSILLECTOMY AND ADENOIDECTOMY    . TOTAL HIP ARTHROPLASTY  2004   right    There were no vitals filed for this visit.   Subjective Assessment - 03/12/20 1303    Subjective Patient reports no issues at todays session.    Pertinent History Patient previously seen for balance difficulties, shoulder pain and the right, and low back pain.  Patient reports no falls in the past six months. Patient reports pain has not improved for the past month and has been about the same since the onset of pain.    Limitations Sitting;Standing;Walking    Patient Stated Goals To improve balance when walking, driving pain    Currently in Pain? Yes    Pain Onset More than a month ago           Therapeutic Exercise  Total Gym Level 26 3x15 Leg swings frontal and sagittal 2x15 bilat each  Step ups  onto 4" step with UE support - x 10 B; unilateral UE support - x 10 B; 1 digit UE support - 2 x 5  Lunges in standing - 2 x 20  Standing SLS with hip circles - x15; x15 with YTB   Hip flexion at hip machine - 2 x 12 B 25# Hip abduction at hip machine - 2 x 12 B 25# Side stepping across airex beam - 4 x 5 ft  Tandem stepping across airex beam - 4 x 5 ft    SPC gait training x26ft  SPC Stair Training x3 ascending/descending     Performed exercises to improve LE strength, endurance, and gait      PT Education - 03/12/20 1304    Education Details form/technique with exercise    Person(s) Educated Patient    Methods Explanation;Demonstration    Comprehension Verbalized understanding;Returned demonstration            PT Short Term Goals - 10/26/19  1540      PT SHORT TERM GOAL #1   Title Patient will have a worst pain score of 6/10 to better be able to drive with less overall pain    Baseline 8/10; 10/26/2019: 6/10    Time 3    Period Weeks    Status On-going    Target Date 10/09/19             PT Long Term Goals - 02/15/20 1306      PT LONG TERM GOAL #1   Title Patient will be independent with HEP to continue benefits of therapy after discharge.     Baseline Dependent with form and technique for exercises; 10/26/2019: cueing needed to complete HEP; 12/06/2019:  performing exercises at home    Time 6    Period Weeks      PT LONG TERM GOAL #2   Title Patient will improve 18mWT to over 1 m/s to indicate increased safety with ambulating in a community environment.    Baseline .66 m/s; 10/26/2019: .75 m/s ; 12/06/2019: . 8 m/s; 01/16/20: .91 m/s; 02/15/20: 1.5m/s    Time 6    Period Weeks    Status Achieved      PT LONG TERM GOAL #3   Title Patient will improve FGA to 63 to indicate significant functional improvement of R hip function.    Baseline FGA: 44; 10/26/2019: FGA: 48; 5/192/2019: 53;    Time 6    Period Weeks    Status Deferred      PT LONG TERM GOAL #4   Title Patient will be able to balance for 10sec with SLS to improve static balance and ability to get dressed when in standing    Baseline 2.5 sec B; 10/26/2019: 2.5 sec; 12/06/2019: 3 sec; 01/16/20: 4 sec; 02/15/20 6sec    Time 6    Period Weeks    Status On-going      PT LONG TERM GOAL #5   Title Patient will have a worst hip pain of a 3/10 over the past week to indicate functional improvement with transferring and driving    Baseline worst pain: 8/10; 10/26/2019: 6/10; 12/06/2019: 3/10, 01/16/20: 0/10    Time 6    Period Weeks    Status Achieved      PT LONG TERM GOAL #6   Title Patient will improve LEFS score by 12 points to show an improvement in functional movements and mobility.    Baseline 46; 02/15/20: 40    Time  6    Period Weeks    Status On-going                  Plan - 03/12/20 1305    Clinical Impression Statement Continued to focus on strength and balance during today's session.  Patient is improving with balance exercise which is demonstrated by needing less UE support while performing the exercises. Patients' strength is improving but continues to have difficulty from standing up from low surfaces without UE support.  Patient will benefit from skilled therapy to return to prior level of function.    Personal Factors and Comorbidities Age;Comorbidity 2    Comorbidities hip fx, RA    Examination-Activity Limitations Lift;Squat;Stand;Transfers    Stability/Clinical Decision Making Evolving/Moderate complexity    Clinical Decision Making Moderate    Rehab Potential Good    PT Frequency 2x / week    PT Duration 6 weeks    PT Treatment/Interventions Neuromuscular re-education;Stair training;Moist Heat;Cryotherapy;Iontophoresis 4mg /ml Dexamethasone;Electrical Stimulation;DME Instruction;Therapeutic activities;Therapeutic exercise;Balance training;Manual techniques;Patient/family education;Passive range of motion;Dry needling;Joint Manipulations    PT Next Visit Plan progress hip strengthening, use of SPC    PT Home Exercise Plan See education section    Consulted and Agree with Plan of Care Patient           Patient will benefit from skilled therapeutic intervention in order to improve the following deficits and impairments:  Abnormal gait, Decreased activity tolerance, Decreased endurance, Decreased range of motion, Decreased strength, Decreased balance, Difficulty walking, Decreased cognition, Pain, Decreased coordination, Increased muscle spasms, Postural dysfunction, Hypomobility  Visit Diagnosis: Unsteadiness on feet  History of falling  Pain in right hip  Muscle weakness (generalized)  Acute bilateral low back pain without sciatica     Problem List Patient Active Problem List   Diagnosis Date Noted  . DOE (dyspnea on  exertion) 02/09/2020  . Lethargy 02/09/2020  . Anxiety 01/23/2020  . Closed right hip fracture ([place]) 09/20/2019  . Gout 05/18/2019  . Clavi 03/09/2019  . Right hip pain 07/08/2018  . Chronic bilateral low back pain without sciatica 12/31/2017  . Atrial fibrillation ([place]) 09/10/2017  . Trapezius strain, right, initial encounter 09/10/2017  . Depression, major, single episode, mild ([place]) 09/10/2017  . Chronic venous insufficiency 09/09/2017  . Lymphedema 09/09/2017  . Pain of toe of left foot 08/17/2017  . Leg swelling 07/31/2017  . Dry eyes 07/06/2017  . Pain and swelling of lower extremity, right 03/29/2017  . Right shoulder pain 03/29/2017  . Diarrhea 05/18/2016  . Fall 05/01/2016  . Leg weakness, bilateral 12/30/2015  . Macrocytic anemia 05/19/2013  . Psoriatic arthritis ([place]) 02/03/2013  . Right hand pain 08/05/2012  . Psoriasis   . Osteoarthritis, multiple sites   . History of breast cancer 10/28/2011  . GERD (gastroesophageal reflux disease)   . Hypertension   . Osteopenia   . Carotid stenosis 05/27/2011   1:45 PM, 03/12/20 Margarito Liner, SPT Student Physical Therapist [place]  [phone]  Margarito Liner 03/12/2020, 1:29 PM  [facility] [address] [place], [address] Phone: [phone]   Fax:  [phone]  Name: [name] MRN: [id number] Date of Birth: 06-Feb-1941

## 2020-03-14 ENCOUNTER — Other Ambulatory Visit: Payer: Self-pay | Admitting: Physician Assistant

## 2020-03-14 NOTE — Telephone Encounter (Signed)
This is a [facility] pt 

## 2020-03-14 NOTE — Telephone Encounter (Signed)
Pt last saw Murray Hodgkins, PA on 03/07/20, last labs 02/16/20 Creat 1.09, age 79, weight 52.6kg, CrCl 34.18, based on CrCl pt is on appropriate dosage of Pradaxa 150mg  BID.  Will refill rx.

## 2020-03-18 ENCOUNTER — Telehealth: Payer: Self-pay | Admitting: Family Medicine

## 2020-03-18 ENCOUNTER — Telehealth: Payer: Self-pay | Admitting: Internal Medicine

## 2020-03-18 ENCOUNTER — Ambulatory Visit: Payer: Medicare Other

## 2020-03-18 NOTE — Telephone Encounter (Signed)
   Primary Cardiologist: Virl Axe, MD  Chart reviewed as part of pre-operative protocol coverage. Will route to callback team to get more information from dental office on formal details (confirmation of how many teeth and what kind of anesthesia).  Charlie Pitter, PA-C 03/18/2020, 3:15 PM

## 2020-03-18 NOTE — Telephone Encounter (Signed)
Patient and called back and [name] explained that she needs to call the Cardiologist because they are the one managing her blood thinner.  Rohn Fritsch,cma

## 2020-03-18 NOTE — Telephone Encounter (Signed)
ADDENDUM: 1 TOOTH TO BE EXTRACTED ANESTHESIA: LOCAL WITH NITROUS FAX #: [phone] DR. JAMIE PETERSON, DDS WITH [facility]

## 2020-03-18 NOTE — Telephone Encounter (Signed)
Pt states that she has been miserable all weekend with a tooth that needs to be pulled. She states that she called her dentist and they need to know how long she can be off of blood thinners before procedure. Please call back ASAP

## 2020-03-18 NOTE — Telephone Encounter (Signed)
   [facility] Pre-operative Risk Assessment    HEARTCARE STAFF: - Please ensure there is not already an duplicate clearance open for this procedure. - Under Visit Info/Reason for Call, type in Other and utilize the format Clearance MM/DD/YY or Clearance TBD. Do not use dashes or single digits. - If request is for dental extraction, please clarify the # of teeth to be extracted.  Request for surgical clearance:  1. What type of surgery is being performed?  Dental Extraction #1 probably ( per patient)    2. When is this surgery scheduled? TBD  3. What type of clearance is required (medical clearance vs. Pharmacy clearance to hold med vs. Both)? Pharmacy   4. Are there any medications that need to be held prior to surgery and how long? Pradaxa please advise   5. Practice name and name of physician performing surgery? [facility]   6. What is the office phone number?  [phone]   7.   What is the office fax number? unknown 8.   Anesthesia type (None, local, MAC, general) ? unknown    Clarisse Gouge 03/18/2020, 1:29 PM  _________________________________________________________________   (provider comments below)

## 2020-03-18 NOTE — Telephone Encounter (Signed)
I would have her call cardiology to help determine that as they have been managing her pradaxa.

## 2020-03-18 NOTE — Telephone Encounter (Signed)
Patient  Called in about her blood thinner for dental prodcure and per CMA  She is to call cardiologist about her blood thinner

## 2020-03-18 NOTE — Telephone Encounter (Signed)
   Primary Cardiologist: Virl Axe, MD  Chart reviewed as part of pre-operative protocol coverage.   Simple dental extractions are considered low risk procedures per guidelines and generally do not require any specific cardiac clearance. It is also generally accepted that for simple extractions and dental cleanings, there is no need to interrupt blood thinner therapy.  SBE prophylaxis is not required for the patient based on clinical information.  I will route this recommendation to the requesting party via Epic fax function and remove from pre-op pool.  Please call with questions.  Charlie Pitter, PA-C 03/18/2020, 3:51 PM

## 2020-03-19 DIAGNOSIS — Z681 Body mass index (BMI) 19 or less, adult: Secondary | ICD-10-CM | POA: Diagnosis not present

## 2020-03-19 DIAGNOSIS — M1A09X1 Idiopathic chronic gout, multiple sites, with tophus (tophi): Secondary | ICD-10-CM | POA: Diagnosis not present

## 2020-03-19 DIAGNOSIS — M2550 Pain in unspecified joint: Secondary | ICD-10-CM | POA: Diagnosis not present

## 2020-03-19 DIAGNOSIS — M150 Primary generalized (osteo)arthritis: Secondary | ICD-10-CM | POA: Diagnosis not present

## 2020-03-19 DIAGNOSIS — L409 Psoriasis, unspecified: Secondary | ICD-10-CM | POA: Diagnosis not present

## 2020-03-28 ENCOUNTER — Ambulatory Visit: Payer: Medicare Other | Attending: Orthopedic Surgery

## 2020-03-28 ENCOUNTER — Other Ambulatory Visit: Payer: Self-pay

## 2020-03-28 DIAGNOSIS — R2681 Unsteadiness on feet: Secondary | ICD-10-CM

## 2020-03-28 DIAGNOSIS — M25551 Pain in right hip: Secondary | ICD-10-CM

## 2020-03-28 DIAGNOSIS — M6281 Muscle weakness (generalized): Secondary | ICD-10-CM | POA: Diagnosis not present

## 2020-03-28 DIAGNOSIS — M545 Low back pain, unspecified: Secondary | ICD-10-CM

## 2020-03-28 DIAGNOSIS — Z9181 History of falling: Secondary | ICD-10-CM

## 2020-03-28 NOTE — Therapy (Signed)
[facility] [address], [place], [address] Phone: [phone]   Fax:  [phone]  Physical Therapy Treatment  Patient Details  Name: [name] MRN: [id number] Date of Birth: 1941-05-09 Referring Provider (PT): Tamala Julian   Encounter Date: 03/28/2020   PT End of Session - 03/28/20 1334    Visit Number 41    Number of Visits 46    Date for PT Re-Evaluation 03/28/20    Authorization Type 5/10    PT Start Time 1330    PT Stop Time 1415    PT Time Calculation (min) 45 min    Equipment Utilized During Treatment Gait belt    Activity Tolerance Patient tolerated treatment well;No increased pain    Behavior During Therapy WFL for tasks assessed/performed           Past Medical History:  Diagnosis Date  . (HFpEF) heart failure with preserved ejection fraction ([place])    a. 08/2017 Echo: EF 55-60%, no rwma, mild MR, mildly dil LA, nl RV fxn.  . Breast cancer ([place]) 2001   left breast  . Cancer ([place]) 2001   left breast ca  . Carotid arterial disease ([place])    a. 10/2004 s/p L CEA; b. 12/2015 Carotid U/S: RICA 1-39%; b. LICA patent CEA site.  Marland Kitchen GERD (gastroesophageal reflux disease)   . Hyperlipidemia   . Hypertension   . Osteoarthritis, multiple sites   . Osteopenia   . Persistent atrial fibrillation ([place])    a. Dx 08/2017; b. CHA2DS2VASc = 6-->Pradaxa; c. 09/2017 Successful DCCV (second shock - 200J); d. 10/2017 Recurrent Afib-->flecainide started 11/2017.  Marland Kitchen Personal history of radiation therapy 2001   left breast ca  . Psoriasis     Past Surgical History:  Procedure Laterality Date  . ABDOMINAL HYSTERECTOMY    . ANKLE FRACTURE SURGERY  4/08   left---hardware still in place  . 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 BREAST EXCISIONAL BIOPSY Left yrs ago   benign  . BREAST LUMPECTOMY Left 2001   f/u radiation  . CARDIOVERSION N/A 10/04/2017   Procedure: CARDIOVERSION;  Surgeon: Wellington Hampshire, MD;  Location: [facility] ORS;  Service: Cardiovascular;  Laterality: N/A;  . CARDIOVERSION N/A 12/16/2017   Procedure: CARDIOVERSION;  Surgeon: [name], MD;  Location: [facility] ORS;  Service: Cardiovascular;  Laterality: N/A;  . CAROTID ENDARTERECTOMY Left 10/23/2004  . FRACTURE SURGERY    . OOPHORECTOMY    . SHOULDER SURGERY  6/07   left  . TONSILLECTOMY AND ADENOIDECTOMY    . TOTAL HIP ARTHROPLASTY  2004   right    There were no vitals filed for this visit.   Subjective Assessment - 03/28/20 1333    Subjective Patient reports no issues at todays session and recently had teeth extracted.    Pertinent History Patient previously seen for balance difficulties, shoulder pain and the right, and low back pain.  Patient reports no falls in the past six months. Patient reports pain has not improved for the past month and has been about the same since the onset of pain.    Limitations Sitting;Standing;Walking    Patient Stated Goals To improve balance when walking, driving pain    Currently in Pain? Yes    Pain Score 2     Pain Onset More than a month ago           Therapeutic Exercise  Total Gym Level 26  3x15 Leg swings frontal and sagittal 2x15 bilat each  Step ups onto 4" step with UE support - x 10 B; unilateral UE support - x 10 B; 1 digit UE support - 2 x 5  Lunges in standing - 2 x 20  Standing SLS with hip circles - x15; x15 with YTB   Side stepping across airex beam - 8 x 5 ft  Hip flexion at hip machine - 2 x 12 B 25# Tandem stepping across airex beam - 4 x 5 ft   SPC gait training x41ft outside for traversing different terrain SPC gait training 6inch curb x2 up/down [facility] Stair Training x1 6stairs outside ascending     Performed exercises to improve LE strength, endurance, and gait     PT Education - 03/28/20 1334    Education Details form/technique with exercise    Person(s) Educated Patient    Methods Explanation;Demonstration    Comprehension Verbalized  understanding;Returned demonstration            PT Short Term Goals - 10/26/19 1540      PT SHORT TERM GOAL #1   Title Patient will have a worst pain score of 6/10 to better be able to drive with less overall pain    Baseline 8/10; 10/26/2019: 6/10    Time 3    Period Weeks    Status On-going    Target Date 10/09/19             PT Long Term Goals - 02/15/20 1306      PT LONG TERM GOAL #1   Title Patient will be independent with HEP to continue benefits of therapy after discharge.     Baseline Dependent with form and technique for exercises; 10/26/2019: cueing needed to complete HEP; 12/06/2019:  performing exercises at home    Time 6    Period Weeks      PT LONG TERM GOAL #2   Title Patient will improve 46mWT to over 1 m/s to indicate increased safety with ambulating in a community environment.    Baseline .66 m/s; 10/26/2019: .75 m/s ; 12/06/2019: . 8 m/s; 01/16/20: .91 m/s; 02/15/20: 1.32m/s    Time 6    Period Weeks    Status Achieved      PT LONG TERM GOAL #3   Title Patient will improve FGA to 63 to indicate significant functional improvement of R hip function.    Baseline FGA: 44; 10/26/2019: FGA: 48; 5/192/2019: 53;    Time 6    Period Weeks    Status Deferred      PT LONG TERM GOAL #4   Title Patient will be able to balance for 10sec with SLS to improve static balance and ability to get dressed when in standing    Baseline 2.5 sec B; 10/26/2019: 2.5 sec; 12/06/2019: 3 sec; 01/16/20: 4 sec; 02/15/20 6sec    Time 6    Period Weeks    Status On-going      PT LONG TERM GOAL #5   Title Patient will have a worst hip pain of a 3/10 over the past week to indicate functional improvement with transferring and driving    Baseline worst pain: 8/10; 10/26/2019: 6/10; 12/06/2019: 3/10, 01/16/20: 0/10    Time 6    Period Weeks    Status Achieved      PT LONG TERM GOAL #6   Title Patient will improve LEFS score by 12 points to show an improvement in functional movements and  mobility.     Baseline 46; 02/15/20: 40    Time 6    Period Weeks    Status On-going                 Plan - 03/28/20 1415    Clinical Impression Statement Continued working on improving patient's strength, balance, and gait with SPC during today's session.  Patient tolerated session well with minimum muscular fatigue at end of session.  Patient's dynamic balance on unstable surfaces remains difficult for patient as well as exercises with periods of SLS.  Patient will benefit from further skilled therapy to return to prior level of function and address remaining limitations/difficulties.    Personal Factors and Comorbidities Age;Comorbidity 2    Comorbidities hip fx, RA    Examination-Activity Limitations Lift;Squat;Stand;Transfers    Stability/Clinical Decision Making Evolving/Moderate complexity    Clinical Decision Making Moderate    Rehab Potential Good    PT Frequency 2x / week    PT Duration 6 weeks    PT Treatment/Interventions Neuromuscular re-education;Stair training;Moist Heat;Cryotherapy;Iontophoresis 4mg /ml Dexamethasone;Electrical Stimulation;DME Instruction;Therapeutic activities;Therapeutic exercise;Balance training;Manual techniques;Patient/family education;Passive range of motion;Dry needling;Joint Manipulations    PT Next Visit Plan progress hip strengthening, use of SPC    PT Home Exercise Plan See education section    Consulted and Agree with Plan of Care Patient           Patient will benefit from skilled therapeutic intervention in order to improve the following deficits and impairments:  Abnormal gait, Decreased activity tolerance, Decreased endurance, Decreased range of motion, Decreased strength, Decreased balance, Difficulty walking, Decreased cognition, Pain, Decreased coordination, Increased muscle spasms, Postural dysfunction, Hypomobility  Visit Diagnosis: Unsteadiness on feet  History of falling  Pain in right hip  Muscle weakness (generalized)  Acute  bilateral low back pain without sciatica     Problem List Patient Active Problem List   Diagnosis Date Noted  . DOE (dyspnea on exertion) 02/09/2020  . Lethargy 02/09/2020  . Anxiety 01/23/2020  . Closed right hip fracture ([place]) 09/20/2019  . Gout 05/18/2019  . Clavi 03/09/2019  . Right hip pain 07/08/2018  . Chronic bilateral low back pain without sciatica 12/31/2017  . Atrial fibrillation ([place]) 09/10/2017  . Trapezius strain, right, initial encounter 09/10/2017  . Depression, major, single episode, mild ([place]) 09/10/2017  . Chronic venous insufficiency 09/09/2017  . Lymphedema 09/09/2017  . Pain of toe of left foot 08/17/2017  . Leg swelling 07/31/2017  . Dry eyes 07/06/2017  . Pain and swelling of lower extremity, right 03/29/2017  . Right shoulder pain 03/29/2017  . Diarrhea 05/18/2016  . Fall 05/01/2016  . Leg weakness, bilateral 12/30/2015  . Macrocytic anemia 05/19/2013  . Psoriatic arthritis ([place]) 02/03/2013  . Right hand pain 08/05/2012  . Psoriasis   . Osteoarthritis, multiple sites   . History of breast cancer 10/28/2011  . GERD (gastroesophageal reflux disease)   . Hypertension   . Osteopenia   . Carotid stenosis 05/27/2011   2:17 PM, 03/28/20 Margarito Liner, SPT Student Physical Therapist [place]  [phone]  Margarito Liner 03/28/2020, 2:16 PM  [facility] [address] [place], [address] Phone: [phone]   Fax:  [phone]  Name: [name] MRN: [id number] Date of Birth: [birth year]

## 2020-03-29 ENCOUNTER — Emergency Department (HOSPITAL_COMMUNITY)
Admission: EM | Admit: 2020-03-29 | Discharge: 2020-03-29 | Disposition: A | Discharge status: Home / Self Care | Payer: Medicare Other | Attending: Emergency Medicine | Admitting: Emergency Medicine

## 2020-03-29 ENCOUNTER — Emergency Department (HOSPITAL_COMMUNITY): Payer: Medicare Other

## 2020-03-29 ENCOUNTER — Ambulatory Visit (INDEPENDENT_AMBULATORY_CARE_PROVIDER_SITE_OTHER): Payer: Medicare Other

## 2020-03-29 DIAGNOSIS — F10929 Alcohol use, unspecified with intoxication, unspecified: Secondary | ICD-10-CM | POA: Insufficient documentation

## 2020-03-29 DIAGNOSIS — Y92010 Kitchen of single-family (private) house as the place of occurrence of the external cause: Secondary | ICD-10-CM | POA: Diagnosis not present

## 2020-03-29 DIAGNOSIS — S0990XA Unspecified injury of head, initial encounter: Secondary | ICD-10-CM | POA: Diagnosis present

## 2020-03-29 DIAGNOSIS — R0609 Other forms of dyspnea: Secondary | ICD-10-CM

## 2020-03-29 DIAGNOSIS — W19XXXA Unspecified fall, initial encounter: Secondary | ICD-10-CM

## 2020-03-29 DIAGNOSIS — M26602 Left temporomandibular joint disorder, unspecified: Secondary | ICD-10-CM | POA: Diagnosis not present

## 2020-03-29 DIAGNOSIS — S0101XA Laceration without foreign body of scalp, initial encounter: Secondary | ICD-10-CM | POA: Diagnosis not present

## 2020-03-29 DIAGNOSIS — R001 Bradycardia, unspecified: Secondary | ICD-10-CM | POA: Diagnosis not present

## 2020-03-29 DIAGNOSIS — Z7901 Long term (current) use of anticoagulants: Secondary | ICD-10-CM

## 2020-03-29 DIAGNOSIS — Y999 Unspecified external cause status: Secondary | ICD-10-CM | POA: Diagnosis not present

## 2020-03-29 DIAGNOSIS — W01190A Fall on same level from slipping, tripping and stumbling with subsequent striking against furniture, initial encounter: Secondary | ICD-10-CM | POA: Insufficient documentation

## 2020-03-29 DIAGNOSIS — Y939 Activity, unspecified: Secondary | ICD-10-CM | POA: Insufficient documentation

## 2020-03-29 DIAGNOSIS — F10920 Alcohol use, unspecified with intoxication, uncomplicated: Secondary | ICD-10-CM

## 2020-03-29 DIAGNOSIS — R0600 Dyspnea, unspecified: Secondary | ICD-10-CM

## 2020-03-29 DIAGNOSIS — R0689 Other abnormalities of breathing: Secondary | ICD-10-CM | POA: Diagnosis not present

## 2020-03-29 DIAGNOSIS — R58 Hemorrhage, not elsewhere classified: Secondary | ICD-10-CM | POA: Diagnosis not present

## 2020-03-29 DIAGNOSIS — R000 Tachycardia, unspecified: Secondary | ICD-10-CM | POA: Diagnosis not present

## 2020-03-29 DIAGNOSIS — I4891 Unspecified atrial fibrillation: Secondary | ICD-10-CM | POA: Insufficient documentation

## 2020-03-29 DIAGNOSIS — M47812 Spondylosis without myelopathy or radiculopathy, cervical region: Secondary | ICD-10-CM | POA: Diagnosis not present

## 2020-03-29 DIAGNOSIS — G319 Degenerative disease of nervous system, unspecified: Secondary | ICD-10-CM | POA: Diagnosis not present

## 2020-03-29 DIAGNOSIS — I672 Cerebral atherosclerosis: Secondary | ICD-10-CM | POA: Diagnosis not present

## 2020-03-29 DIAGNOSIS — S199XXA Unspecified injury of neck, initial encounter: Secondary | ICD-10-CM | POA: Diagnosis not present

## 2020-03-29 LAB — CBC
HCT: 32.2 % — ABNORMAL LOW (ref 36.0–46.0)
Hemoglobin: 10.2 g/dL — ABNORMAL LOW (ref 12.0–15.0)
MCH: 36.4 pg — ABNORMAL HIGH (ref 26.0–34.0)
MCHC: 31.7 g/dL (ref 30.0–36.0)
MCV: 115 fL — ABNORMAL HIGH (ref 80.0–100.0)
Platelets: 248 10*3/uL (ref 150–400)
RBC: 2.8 MIL/uL — ABNORMAL LOW (ref 3.87–5.11)
RDW: 14.5 % (ref 11.5–15.5)
WBC: 7.1 10*3/uL (ref 4.0–10.5)
nRBC: 0 % (ref 0.0–0.2)

## 2020-03-29 LAB — I-STAT CHEM 8, ED
BUN: 29 mg/dL — ABNORMAL HIGH (ref 8–23)
Calcium, Ion: 1.13 mmol/L — ABNORMAL LOW (ref 1.15–1.40)
Chloride: 105 mmol/L (ref 98–111)
Creatinine, Ser: 1.7 mg/dL — ABNORMAL HIGH (ref 0.44–1.00)
Glucose, Bld: 89 mg/dL (ref 70–99)
HCT: 35 % — ABNORMAL LOW (ref 36.0–46.0)
Hemoglobin: 11.9 g/dL — ABNORMAL LOW (ref 12.0–15.0)
Potassium: 4.5 mmol/L (ref 3.5–5.1)
Sodium: 137 mmol/L (ref 135–145)
TCO2: 19 mmol/L — ABNORMAL LOW (ref 22–32)

## 2020-03-29 LAB — ECHOCARDIOGRAM COMPLETE
AR max vel: 1.89 cm2
AV Area VTI: 1.86 cm2
AV Area mean vel: 1.71 cm2
AV Mean grad: 3 mmHg
AV Peak grad: 5.9 mmHg
Ao pk vel: 1.22 m/s
Area-P 1/2: 4.24 cm2
Calc EF: 54.6 %
S' Lateral: 3.1 cm
Single Plane A2C EF: 54.2 %
Single Plane A4C EF: 57.1 %

## 2020-03-29 LAB — ETHANOL: Alcohol, Ethyl (B): 269 mg/dL — ABNORMAL HIGH (ref <10)

## 2020-03-29 LAB — COMPREHENSIVE METABOLIC PANEL
ALT: 11 U/L (ref 0–44)
AST: 22 U/L (ref 15–41)
Albumin: 3.7 g/dL (ref 3.5–5.0)
Alkaline Phosphatase: 88 U/L (ref 38–126)
Anion gap: 15 (ref 5–15)
BUN: 27 mg/dL — ABNORMAL HIGH (ref 8–23)
CO2: 18 mmol/L — ABNORMAL LOW (ref 22–32)
Calcium: 9.3 mg/dL (ref 8.9–10.3)
Chloride: 105 mmol/L (ref 98–111)
Creatinine, Ser: 1.24 mg/dL — ABNORMAL HIGH (ref 0.44–1.00)
GFR calc Af Amer: 47 mL/min — ABNORMAL LOW (ref >60)
GFR calc non Af Amer: 41 mL/min — ABNORMAL LOW (ref >60)
Glucose, Bld: 98 mg/dL (ref 70–99)
Potassium: 4.5 mmol/L (ref 3.5–5.1)
Sodium: 138 mmol/L (ref 135–145)
Total Bilirubin: 0.4 mg/dL (ref 0.3–1.2)
Total Protein: 6.2 g/dL — ABNORMAL LOW (ref 6.5–8.1)

## 2020-03-29 LAB — SAMPLE TO BLOOD BANK

## 2020-03-29 LAB — LACTIC ACID, PLASMA: Lactic Acid, Venous: 3.2 mmol/L (ref 0.5–1.9)

## 2020-03-29 LAB — PROTIME-INR
INR: 1 (ref 0.8–1.2)
Prothrombin Time: 12.8 seconds (ref 11.4–15.2)

## 2020-03-29 MED ORDER — SODIUM CHLORIDE 0.9 % IV BOLUS
1000.0000 mL | Freq: Once | INTRAVENOUS | Status: AC
  Administered 2020-03-29: 1000 mL via INTRAVENOUS

## 2020-03-29 NOTE — ED Triage Notes (Signed)
Pt transported from home (pt lives alone) pt had unwitnessed fall this evening in kitchen possibly striking table or chair. Hematoma and laceration to back of head, arterial spray per EMS. On arrival bleeding uncontrolled. Pt is alert, does not recall fall. +etoh. No ccollar in place. #20 R FA

## 2020-03-29 NOTE — ED Notes (Signed)
Pt and family verbalized understanding of d/c instructions, follow up and wound care. Pt to [facility] via [facility] with family

## 2020-03-29 NOTE — ED Provider Notes (Addendum)
[facility] EMERGENCY DEPARTMENT Provider Note   CSN: [id number] Arrival date & time: 03/29/20  2013     History Chief Complaint  Patient presents with  . Fall    [name] is a 79 y.o. female.  History of A. fib on Pradaxa presents to ER after fall.  Unsure of LOC.  Suspect hit head on table or chair.  EMS reported large amount of bleeding from scalp laceration.  Patient reports that she has no pain, no acute medical complaints at this time except for the bleeding from her scalp.  Reports that she was having some evening cocktails by herself.  She denies history of alcohol abuse.   HPI     No past medical history on file.  There are no problems to display for this patient.     OB History   No obstetric history on file.     No family history on file.  Social History   Tobacco Use  . Smoking status: Not on file  Substance Use Topics  . Alcohol use: Not on file  . Drug use: Not on file    Home Medications Prior to Admission medications   Medication Sig Start Date End Date Taking? Authorizing Provider  Cholecalciferol (VITAMIN D-3) 25 MCG (1000 UT) CAPS Take 1,000 Units by mouth 2 (two) times daily.   Yes [provider]  flecainide (TAMBOCOR) 50 MG tablet Take 50 mg by mouth 2 (two) times daily.   Yes [provider]  furosemide (LASIX) 40 MG tablet Take 40 mg by mouth in the morning.   Yes [provider]  LORazepam (ATIVAN) 0.5 MG tablet Take 0.5 mg by mouth daily as needed for anxiety.   Yes [provider]  losartan (COZAAR) 50 MG tablet Take 50 mg by mouth daily.   Yes [provider]  metoprolol tartrate (LOPRESSOR) 50 MG tablet Take 25 mg by mouth 2 (two) times daily.   Yes [provider]  mirtazapine (REMERON) 15 MG tablet Take 15 mg by mouth at bedtime.   Yes [provider]  Multiple Vitamins-Minerals (ONE-A-DAY WOMENS PO) Take 1 tablet by mouth daily.   Yes  [provider]  Multiple Vitamins-Minerals (PRESERVISION AREDS 2) CAPS Take 1 capsule by mouth 2 (two) times daily.   Yes [provider]  PRADAXA 150 MG CAPS capsule Take 150 mg by mouth 2 (two) times daily.   Yes [provider]  Cholecalciferol (VITAMIN D-3 PO) Take 1 capsule by mouth 2 (two) times daily. Patient not taking: Reported on 03/29/2020    [provider]    Allergies    Adhesive [tape]  Review of Systems   Review of Systems  Constitutional: Negative for chills and fever.  HENT: Negative for ear pain and sore throat.   Eyes: Negative for pain and visual disturbance.  Respiratory: Negative for cough and shortness of breath.   Cardiovascular: Negative for chest pain and palpitations.  Gastrointestinal: Negative for abdominal pain and vomiting.  Genitourinary: Negative for dysuria and hematuria.  Musculoskeletal: Negative for arthralgias and back pain.  Skin: Negative for color change and rash.  Neurological: Negative for seizures and syncope.  All other systems reviewed and are negative.   Physical Exam Updated Vital Signs BP (!) 101/39   Pulse (!) 57   Temp 97.8 F (36.6 C) (Oral)   Resp 16   Ht 5\' 5"  (1.651 m)   Wt 52.2 kg   SpO2 100%  BMI 19.15 kg/m   Physical Exam Vitals and nursing note reviewed.  Constitutional:      General: She is not in acute distress.    Appearance: She is well-developed.  HENT:     Head: Normocephalic.     Comments: 4cm laceration to posterior left occiput, active bleeding noted Eyes:     Conjunctiva/sclera: Conjunctivae normal.  Cardiovascular:     Rate and Rhythm: Bradycardia present. Rhythm irregular.     Heart sounds: No murmur heard.   Pulmonary:     Effort: Pulmonary effort is normal. No respiratory distress.     Breath sounds: Normal breath sounds.  Abdominal:     Palpations: Abdomen is soft.     Tenderness: There is no abdominal tenderness.  Musculoskeletal:     Cervical  back: Neck supple.     Comments: Back: no C, T, L spine TTP, no step off or deformity RUE: no TTP throughout, no deformity, normal joint ROM, radial pulse intact, distal sensation and motor intact LUE: no TTP throughout, no deformity, normal joint ROM, radial pulse intact, distal sensation and motor intact RLE:  no TTP throughout, no deformity, normal joint ROM, distal pulse, sensation and motor intact LLE: no TTP throughout, no deformity, normal joint ROM, distal pulse, sensation and motor intact  Skin:    General: Skin is warm and dry.     Capillary Refill: Capillary refill takes less than 2 seconds.  Neurological:     General: No focal deficit present.     Mental Status: She is alert and oriented to person, place, and time.     ED Results / Procedures / Treatments   Labs (all labs ordered are listed, but only abnormal results are displayed) Labs Reviewed  COMPREHENSIVE METABOLIC PANEL - Abnormal; Notable for the following components:      Result Value   CO2 18 (*)    BUN 27 (*)    Creatinine, Ser 1.24 (*)    Total Protein 6.2 (*)    GFR calc non Af Amer 41 (*)    GFR calc Af Amer 47 (*)    All other components within normal limits  CBC - Abnormal; Notable for the following components:   RBC 2.80 (*)    Hemoglobin 10.2 (*)    HCT 32.2 (*)    MCV 115.0 (*)    MCH 36.4 (*)    All other components within normal limits  ETHANOL - Abnormal; Notable for the following components:   Alcohol, Ethyl (B) 269 (*)    All other components within normal limits  LACTIC ACID, PLASMA - Abnormal; Notable for the following components:   Lactic Acid, Venous 3.2 (*)    All other components within normal limits  I-STAT CHEM 8, ED - Abnormal; Notable for the following components:   BUN 29 (*)    Creatinine, Ser 1.70 (*)    Calcium, Ion 1.13 (*)    TCO2 19 (*)    Hemoglobin 11.9 (*)    HCT 35.0 (*)    All other components within normal limits  PROTIME-INR  URINALYSIS, ROUTINE W REFLEX  MICROSCOPIC  SAMPLE TO BLOOD BANK    EKG EKG Interpretation  Date/Time:  Friday March 29 2020 20:53:56 EDT Ventricular Rate:  51 PR Interval:    QRS Duration: 104 QT Interval:  532 QTC Calculation: 490 R Axis:   105 Text Interpretation: Atrial fibrillation Right axis deviation Probable anteroseptal infarct, old Confirmed by Madalyn Rob [phone]) on 03/29/2020 10:16:45 PM  Radiology CT HEAD WO CONTRAST  Result Date: 03/29/2020 CLINICAL DATA:  Critical poly trauma EXAM: CT HEAD WITHOUT CONTRAST CT CERVICAL SPINE WITHOUT CONTRAST TECHNIQUE: Multidetector CT imaging of the head and cervical spine was performed following the standard protocol without intravenous contrast. Multiplanar CT image reconstructions of the cervical spine were also generated. COMPARISON:  CT head 03/06/2012 FINDINGS: CT HEAD FINDINGS Brain: Diffuse cerebral atrophy. Ventricular dilatation consistent with central atrophy. Low-attenuation changes in the deep white matter consistent with small vessel ischemia. No abnormal extra-axial fluid collections. No mass effect or midline shift. Gray-white matter junctions are distinct. Basal cisterns are not effaced. No acute intracranial hemorrhage. Vascular: Intracranial arterial vascular calcifications are present. Skull: The calvarium appears intact. Subcutaneous scalp hematoma and laceration over the left posterior parietal region. Skin clips in the left scalp. Sinuses/Orbits: Paranasal sinuses and mastoid air cells are clear. Other: None. CT CERVICAL SPINE FINDINGS Alignment: Straightening of usual cervical lordosis without anterior subluxation. Normal alignment of the posterior elements. Changes likely represent positional artifact although muscle spasm could also have this appearance. C1-2 articulation appears intact. Skull base and vertebrae: Skull base appears intact. No vertebral compression deformities. No focal bone lesion or bone destruction. Bone cortex appears  intact. Degenerative changes noted in the left temporomandibular joint. Soft tissues and spinal canal: No prevertebral soft tissue swelling. No abnormal paraspinal soft tissue mass or infiltration. Vascular calcifications. Disc levels: Degenerative changes throughout the cervical spine with narrowed interspaces and endplate hypertrophic change. Degenerative changes in the facet joints. Upper chest: Visualized lung apices are clear. Other: None. IMPRESSION: 1. No acute intracranial abnormalities. Chronic atrophy and small vessel ischemic changes. 2. Subcutaneous scalp hematoma and laceration over the left posterior parietal region. 3. Nonspecific straightening of usual cervical lordosis. Degenerative changes throughout the cervical spine. No acute displaced fractures identified. Electronically Signed   By: Lucienne Capers M.D.   On: 03/29/2020 20:57   CT Cervical Spine Wo Contrast  Result Date: 03/29/2020 CLINICAL DATA:  Critical poly trauma EXAM: CT HEAD WITHOUT CONTRAST CT CERVICAL SPINE WITHOUT CONTRAST TECHNIQUE: Multidetector CT imaging of the head and cervical spine was performed following the standard protocol without intravenous contrast. Multiplanar CT image reconstructions of the cervical spine were also generated. COMPARISON:  CT head 03/06/2012 FINDINGS: CT HEAD FINDINGS Brain: Diffuse cerebral atrophy. Ventricular dilatation consistent with central atrophy. Low-attenuation changes in the deep white matter consistent with small vessel ischemia. No abnormal extra-axial fluid collections. No mass effect or midline shift. Gray-white matter junctions are distinct. Basal cisterns are not effaced. No acute intracranial hemorrhage. Vascular: Intracranial arterial vascular calcifications are present. Skull: The calvarium appears intact. Subcutaneous scalp hematoma and laceration over the left posterior parietal region. Skin clips in the left scalp. Sinuses/Orbits: Paranasal sinuses and mastoid air cells are  clear. Other: None. CT CERVICAL SPINE FINDINGS Alignment: Straightening of usual cervical lordosis without anterior subluxation. Normal alignment of the posterior elements. Changes likely represent positional artifact although muscle spasm could also have this appearance. C1-2 articulation appears intact. Skull base and vertebrae: Skull base appears intact. No vertebral compression deformities. No focal bone lesion or bone destruction. Bone cortex appears intact. Degenerative changes noted in the left temporomandibular joint. Soft tissues and spinal canal: No prevertebral soft tissue swelling. No abnormal paraspinal soft tissue mass or infiltration. Vascular calcifications. Disc levels: Degenerative changes throughout the cervical spine with narrowed interspaces and endplate hypertrophic change. Degenerative changes in the facet joints. Upper chest: Visualized lung apices are clear. Other: None. IMPRESSION: 1. No  acute intracranial abnormalities. Chronic atrophy and small vessel ischemic changes. 2. Subcutaneous scalp hematoma and laceration over the left posterior parietal region. 3. Nonspecific straightening of usual cervical lordosis. Degenerative changes throughout the cervical spine. No acute displaced fractures identified. Electronically Signed   By: Lucienne Capers M.D.   On: 03/29/2020 20:57    Procedures Procedures (including critical care time)  Medications Ordered in ED Medications  sodium chloride 0.9 % bolus 1,000 mL (1,000 mLs Intravenous New Bag/Given 03/29/20 2048)    ED Course  I have reviewed the triage vital signs and the nursing notes.  Pertinent labs & imaging results that were available during my care of the patient were reviewed by me and considered in my medical decision making (see chart for details).    MDM Rules/Calculators/A&P                          79 year old lady with history of A. fib on Pradaxa presents to ER after unwitnessed fall.  On trauma assessment,  isolated scalp laceration.  No other trauma identified on head to toe inspection. There was active bleeding noted when this was examined initially.  Injected with lidocaine with epinephrine, closed with staples and hemostasis was achieved.  CT head and C-spine negative for acute pathology.  Labs were stable, noted elevated alcohol level.  Family number came to bedside and will take patient home tonight.     After the discussed management above, the patient was determined to be safe for discharge.  The patient was in agreement with this plan and all questions regarding their care were answered.  ED return precautions were discussed and the patient will return to the ED with any significant worsening of condition.   Final Clinical Impression(s) / ED Diagnoses Final diagnoses:  Fall, initial encounter  Laceration of scalp, initial encounter  Current use of long term anticoagulation  Alcoholic intoxication without complication [facility])    Rx / DC Orders ED Discharge Orders    None       Lucrezia Starch, MD 03/29/20 2218    Lucrezia Starch, MD 03/29/20 2218

## 2020-03-29 NOTE — ED Notes (Signed)
Pts daughter at bedside  

## 2020-03-29 NOTE — ED Notes (Signed)
This RN spoke with pts daughter Sharee Pimple, she will be coming to ED shortly, updated on status

## 2020-03-29 NOTE — Discharge Instructions (Addendum)
You can use gentle water over your scalp, but please avoid any vigorous scrubbing or high water pressure.  Please schedule appointment with your primary doctor to be seen in 1 week for recheck regarding your fall today and your head laceration and suture removal.  Please discuss your alcohol use with your primary doctor at that visit as well.

## 2020-03-29 NOTE — ED Notes (Signed)
TRN at bedside performing wound care to head

## 2020-03-29 NOTE — ED Notes (Signed)
Sharee Pimple, daughter, [phone] would like an update when available

## 2020-03-29 NOTE — Progress Notes (Signed)
Orthopedic Tech Progress Note Patient Details:  [name] September 02, 1940 [id number] Level 2 trauma Patient ID: [name], female   DOB: 04-Oct-1940, 79 y.o.   MRN: [id number]   [name] 03/29/2020, 8:46 PM

## 2020-04-01 ENCOUNTER — Other Ambulatory Visit: Payer: Self-pay | Admitting: Family Medicine

## 2020-04-02 ENCOUNTER — Telehealth: Payer: Self-pay

## 2020-04-02 DIAGNOSIS — I4819 Other persistent atrial fibrillation: Secondary | ICD-10-CM

## 2020-04-02 MED ORDER — FUROSEMIDE 40 MG PO TABS: ORAL_TABLET | ORAL | 1 refills | Status: DC

## 2020-04-02 NOTE — Telephone Encounter (Signed)
Call to patient to review labs.  °  °Pt verbalized understanding and has no further questions at this time.  °  °Advised pt to call for any further questions or concerns.  °Orders updated as advised.  °

## 2020-04-02 NOTE — Telephone Encounter (Signed)
-----   Message from Theora Gianotti, NP sent at 04/01/2020  3:45 PM EDT ----- Normal heart squeezing function.  The heart is moderately stiff and right sided filling pressures are moderately elevated.  The mitral valve is mildly leaky.  Regarding stiffness of the heart muscle, good BP and HR control remain important.  Both were acceptable at recent clinic visit.  The elevation of pressures in the heart noted on echo likely indicate mild fluid overload, which will contribute to shortness of breath.  She is currently taking lasix 40mg  daily.  I recommend that she take an additional 20mg  in the afternoons (no later than 3p).  She will need a f/u bmet in 1 wk to assess kidneys and potassium.

## 2020-04-04 ENCOUNTER — Other Ambulatory Visit: Payer: Self-pay

## 2020-04-04 ENCOUNTER — Ambulatory Visit: Payer: Medicare Other

## 2020-04-04 DIAGNOSIS — M6281 Muscle weakness (generalized): Secondary | ICD-10-CM | POA: Diagnosis not present

## 2020-04-04 DIAGNOSIS — M545 Low back pain: Secondary | ICD-10-CM | POA: Diagnosis not present

## 2020-04-04 DIAGNOSIS — Z9181 History of falling: Secondary | ICD-10-CM

## 2020-04-04 DIAGNOSIS — M25551 Pain in right hip: Secondary | ICD-10-CM

## 2020-04-04 DIAGNOSIS — R2681 Unsteadiness on feet: Secondary | ICD-10-CM | POA: Diagnosis not present

## 2020-04-04 NOTE — Therapy (Signed)
[facility] [address] [place], [address] Phone: [phone]   Fax:  [phone]  Physical Therapy Treatment  Patient Details  Name: [name] MRN: [id number] Date of Birth: Aug 13, 1940 Referring Provider (PT): Tamala Julian   Encounter Date: 04/04/2020   PT End of Session - 04/04/20 1314    Visit Number [id number]    Number of Visits 46    Date for PT Re-Evaluation 03/28/20    Authorization Type 6/10    PT Start Time 1300    PT Stop Time 1345    PT Time Calculation (min) 45 min    Equipment Utilized During Treatment Gait belt    Activity Tolerance Patient tolerated treatment well;No increased pain    Behavior During Therapy WFL for tasks assessed/performed           Past Medical History:  Diagnosis Date  . (HFpEF) heart failure with preserved ejection fraction ([place])    a. 08/2017 Echo: EF 55-60%, no rwma, mild MR, mildly dil LA, nl RV fxn.  . Breast cancer ([place]) 2001   left breast  . Cancer ([place]) 2001   left breast ca  . Carotid arterial disease ([place])    a. 10/2004 s/p L CEA; b. 12/2015 Carotid U/S: RICA 1-39%; b. LICA patent CEA site.  Marland Kitchen GERD (gastroesophageal reflux disease)   . Hyperlipidemia   . Hypertension   . Osteoarthritis, multiple sites   . Osteopenia   . Persistent atrial fibrillation ([place])    a. Dx 08/2017; b. CHA2DS2VASc = 6-->Pradaxa; c. 09/2017 Successful DCCV (second shock - 200J); d. 10/2017 Recurrent Afib-->flecainide started 11/2017.  Marland Kitchen Personal history of radiation therapy 2001   left breast ca  . Psoriasis     Past Surgical History:  Procedure Laterality Date  . ABDOMINAL HYSTERECTOMY    . ANKLE FRACTURE SURGERY  4/08   left---hardware still in place  . 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 BREAST EXCISIONAL BIOPSY Left yrs ago   benign  . BREAST LUMPECTOMY Left 2001   f/u radiation  . CARDIOVERSION N/A 10/04/2017   Procedure: CARDIOVERSION;  Surgeon: Wellington Hampshire, MD;  Location: [facility] ORS;  Service: Cardiovascular;  Laterality: N/A;  . CARDIOVERSION N/A 12/16/2017   Procedure: CARDIOVERSION;  Surgeon: [name], MD;  Location: [facility] ORS;  Service: Cardiovascular;  Laterality: N/A;  . CAROTID ENDARTERECTOMY Left 10/23/2004  . FRACTURE SURGERY    . OOPHORECTOMY    . SHOULDER SURGERY  6/07   left  . TONSILLECTOMY AND ADENOIDECTOMY    . TOTAL HIP ARTHROPLASTY  2004   right    There were no vitals filed for this visit.   Subjective Assessment - 04/04/20 1313    Subjective Patient reports having fall last week which caused her to lose consciousness and then had another fall the next day.  Since the last fall she has now been feeling tired.    Pertinent History Patient previously seen for balance difficulties, shoulder pain and the right, and low back pain.  Patient reports no falls in the past six months. Patient reports pain has not improved for the past month and has been about the same since the onset of pain.    Limitations Sitting;Standing;Walking    Patient Stated Goals To improve balance when walking, driving pain    Currently in Pain? Yes    Pain Score 2     Pain Onset More than a  month ago          Therapeutic Exercise  Total Gym Level 26 3x15 Hip flexion at hip machine - 2 x 12 B 25# Hip abduction at hip machine - 2x10 B 10#  Gait Training with Roll-Aider  x39ft - Curbs outside of building x5  Education of AD Roll-Aider Safety and function Education about stowing AD in car independently     Performed exercises to improve LE strength, endurance, and gait      PT Education - 04/04/20 1314    Education Details form/technique with exercise    Person(s) Educated Patient    Methods Explanation;Demonstration    Comprehension Verbalized understanding;Returned demonstration            PT Short Term Goals - 10/26/19 1540      PT SHORT TERM GOAL #1   Title Patient will have a worst pain score of 6/10 to better  be able to drive with less overall pain    Baseline 8/10; 10/26/2019: 6/10    Time 3    Period Weeks    Status On-going    Target Date 10/09/19             PT Long Term Goals - 02/15/20 1306      PT LONG TERM GOAL #1   Title Patient will be independent with HEP to continue benefits of therapy after discharge.     Baseline Dependent with form and technique for exercises; 10/26/2019: cueing needed to complete HEP; 12/06/2019:  performing exercises at home    Time 6    Period Weeks      PT LONG TERM GOAL #2   Title Patient will improve 47mWT to over 1 m/s to indicate increased safety with ambulating in a community environment.    Baseline .66 m/s; 10/26/2019: .75 m/s ; 12/06/2019: . 8 m/s; 01/16/20: .91 m/s; 02/15/20: 1.58m/s    Time 6    Period Weeks    Status Achieved      PT LONG TERM GOAL #3   Title Patient will improve FGA to 63 to indicate significant functional improvement of R hip function.    Baseline FGA: 44; 10/26/2019: FGA: 48; 5/192/2019: 53;    Time 6    Period Weeks    Status Deferred      PT LONG TERM GOAL #4   Title Patient will be able to balance for 10sec with SLS to improve static balance and ability to get dressed when in standing    Baseline 2.5 sec B; 10/26/2019: 2.5 sec; 12/06/2019: 3 sec; 01/16/20: 4 sec; 02/15/20 6sec    Time 6    Period Weeks    Status On-going      PT LONG TERM GOAL #5   Title Patient will have a worst hip pain of a 3/10 over the past week to indicate functional improvement with transferring and driving    Baseline worst pain: 8/10; 10/26/2019: 6/10; 12/06/2019: 3/10, 01/16/20: 0/10    Time 6    Period Weeks    Status Achieved      PT LONG TERM GOAL #6   Title Patient will improve LEFS score by 12 points to show an improvement in functional movements and mobility.    Baseline 46; 02/15/20: 40    Time 6    Period Weeks    Status On-going                 Plan - 04/04/20 1315    Clinical  Impression Statement Continued to work on  improving patients LE strength to help increase her balance and endurance.  Patient also brought in her roll-aider to become knowledgeable with AD and to begin gait training with the AD.  Patient was educated on proper gait with AD, safety, mechanics of AD, and independence with stowing AD in car.  Patient demonstrated proper gait, safety, and independence with device after verbal cueing and demonstration of each by therapist.  Will continue to address patient's limitations and improving gait with new AD.  Patient will benefit from skilled therapy to return to prior level of function.    Personal Factors and Comorbidities Age;Comorbidity 2    Comorbidities hip fx, RA    Examination-Activity Limitations Lift;Squat;Stand;Transfers    Stability/Clinical Decision Making Evolving/Moderate complexity    Clinical Decision Making Moderate    Rehab Potential Good    PT Frequency 2x / week    PT Duration 6 weeks    PT Treatment/Interventions Neuromuscular re-education;Stair training;Moist Heat;Cryotherapy;Iontophoresis 4mg /ml Dexamethasone;Electrical Stimulation;DME Instruction;Therapeutic activities;Therapeutic exercise;Balance training;Manual techniques;Patient/family education;Passive range of motion;Dry needling;Joint Manipulations    PT Next Visit Plan progress hip strengthening, use of SPC    PT Home Exercise Plan See education section    Consulted and Agree with Plan of Care Patient           Patient will benefit from skilled therapeutic intervention in order to improve the following deficits and impairments:  Abnormal gait, Decreased activity tolerance, Decreased endurance, Decreased range of motion, Decreased strength, Decreased balance, Difficulty walking, Decreased cognition, Pain, Decreased coordination, Increased muscle spasms, Postural dysfunction, Hypomobility  Visit Diagnosis: Unsteadiness on feet  History of falling  Pain in right hip  Muscle weakness (generalized)     Problem  List Patient Active Problem List   Diagnosis Date Noted  . DOE (dyspnea on exertion) 02/09/2020  . Lethargy 02/09/2020  . Anxiety 01/23/2020  . Closed right hip fracture ([place]) 09/20/2019  . Gout 05/18/2019  . Clavi 03/09/2019  . Right hip pain 07/08/2018  . Chronic bilateral low back pain without sciatica 12/31/2017  . Atrial fibrillation ([place]) 09/10/2017  . Trapezius strain, right, initial encounter 09/10/2017  . Depression, major, single episode, mild ([place]) 09/10/2017  . Chronic venous insufficiency 09/09/2017  . Lymphedema 09/09/2017  . Pain of toe of left foot 08/17/2017  . Leg swelling 07/31/2017  . Dry eyes 07/06/2017  . Pain and swelling of lower extremity, right 03/29/2017  . Right shoulder pain 03/29/2017  . Diarrhea 05/18/2016  . Fall 05/01/2016  . Leg weakness, bilateral 12/30/2015  . Macrocytic anemia 05/19/2013  . Psoriatic arthritis ([place]) 02/03/2013  . Right hand pain 08/05/2012  . Psoriasis   . Osteoarthritis, multiple sites   . History of breast cancer 10/28/2011  . GERD (gastroesophageal reflux disease)   . Hypertension   . Osteopenia   . Carotid stenosis 05/27/2011   2:40 PM, 04/04/20 Margarito Liner, SPT Student Physical Therapist [place]  [phone]  Margarito Liner 04/04/2020, 2:37 PM  [facility] [address] [place], [address] Phone: [phone]   Fax:  [phone]  Name: [name] MRN: [id number] Date of Birth: Jan 01, 1941

## 2020-04-05 ENCOUNTER — Ambulatory Visit (INDEPENDENT_AMBULATORY_CARE_PROVIDER_SITE_OTHER): Payer: Medicare Other | Admitting: Internal Medicine

## 2020-04-05 ENCOUNTER — Telehealth: Payer: Self-pay | Admitting: Family Medicine

## 2020-04-05 DIAGNOSIS — S0101XD Laceration without foreign body of scalp, subsequent encounter: Secondary | ICD-10-CM | POA: Diagnosis not present

## 2020-04-05 DIAGNOSIS — N183 Chronic kidney disease, stage 3 unspecified: Secondary | ICD-10-CM | POA: Insufficient documentation

## 2020-04-05 DIAGNOSIS — I6523 Occlusion and stenosis of bilateral carotid arteries: Secondary | ICD-10-CM | POA: Diagnosis not present

## 2020-04-05 DIAGNOSIS — N1832 Chronic kidney disease, stage 3b: Secondary | ICD-10-CM | POA: Diagnosis not present

## 2020-04-05 DIAGNOSIS — D649 Anemia, unspecified: Secondary | ICD-10-CM | POA: Diagnosis not present

## 2020-04-05 DIAGNOSIS — F1010 Alcohol abuse, uncomplicated: Secondary | ICD-10-CM

## 2020-04-05 HISTORY — DX: Alcohol abuse, uncomplicated: F10.10

## 2020-04-05 NOTE — Progress Notes (Signed)
Subjective:  Patient ID: [name], female    DOB: 10-23-1940  Age: 79 y.o. MRN: [id number]  CC: Diagnoses of Scalp laceration, subsequent encounter, Alcohol abuse, Stage 3b chronic kidney disease, and Anemia, unspecified type were pertinent to this visit.  HPI TERRINA DOCTER presents for suture removal/ER followup.  This visit occurred during the SARS-CoV-2 public health emergency.  Safety protocols were in place, including screening questions prior to the visit, additional usage of staff PPE, and extensive cleaning of exam room while observing appropriate contact time as indicated for disinfecting solutions.    She is a delightful 79 yr old female who sustained a fall at home  on Sept 10 while inebriated (per review of ER records, BAL was > 200) .  She sustained a scalp laceration that bled profusely .  CT head and C spine were done in ER and no acute changes were seen.    8 staples placed,   Furosemide dose was increased to add 20 mg in the afternoon (40 mg daily in the am )  For unclear reasons (no chest x ray done.  Patient states that she was called by someone after her visit and told to do this)   She was discharged home and Later on that night she "blacked out" after using the bathroom,  Hitting  the other side of head on the floor on the way back to her  bed. . Left elbow was scraped  And bled.  Daughter was staying with her that night but was unaware of the subsequent syncopal event until morning. .   Patient lives alone,  Was widowed 2.5 yrs ago  4 children,  2 live nearby.  Has lots of friends in the Alzheimers support group  And teaches an art class. Doesn't eat 3 times daily anymore, has lost 10 lbs in the past year.    Discussed her alcohol consumption.  She appears to be minimalizing  Her intake, and states that she had had 2 cocktails on the evening of the fall .  She states that she  has reduced to once daily.   Cerebral atherosclerosis noted on brain CT.  Refuses  statin due to arthritis, in spite of history of carotid stenosis and h/o CEA.   She hs another lab scheduled for next week TO RECHECK kidney function   She has a chronic anemia, macrocytic,  Present since July, Noted to be worsening  Acute renal failure:  Has not had normal cr since Dec 2020    Outpatient Medications Prior to Visit  Medication Sig Dispense Refill  . allopurinol (ZYLOPRIM) 100 MG tablet Take 100 mg by mouth daily.    . Cholecalciferol (VITAMIN D-3 PO) Take 1 capsule by mouth 2 (two) times daily.     . flecainide (TAMBOCOR) 50 MG tablet TAKE 1 TABLET BY MOUTH TWICE A DAY 180 tablet 1  . flecainide (TAMBOCOR) 50 MG tablet Take 50 mg by mouth 2 (two) times daily.    . furosemide (LASIX) 40 MG tablet Take additional 1/2 tablet (20 mg total) in the afternoon. 90 tablet 1  . LORazepam (ATIVAN) 0.5 MG tablet TAKE 1 TABLET BY MOUTH TWICE A DAY AS NEEDED FOR ANXIETY 60 tablet 0  . losartan (COZAAR) 50 MG tablet Take 1 tablet (50 mg total) by mouth daily. 90 tablet 3  . metoprolol tartrate (LOPRESSOR) 50 MG tablet Take 1/2 tablet (25 mg) by mouth twice daily 90 tablet 1  . mirtazapine (REMERON) 15 MG  tablet Take 15 mg by mouth at bedtime.    . Multiple Vitamin (MULTIVITAMIN) capsule Take 1 capsule by mouth daily.      . Multiple Vitamins-Minerals (PRESERVISION AREDS 2) CAPS Take 1 tablet by mouth 2 (two) times daily.    Marland Kitchen PRADAXA 150 MG CAPS capsule TAKE 1 CAPSULE BY MOUTH TWICE A DAY 180 capsule 1  . Cholecalciferol (VITAMIN D-3) 25 MCG (1000 UT) CAPS Take 1,000 Units by mouth 2 (two) times daily.    . Cholecalciferol (VITAMIN D3) 1000 units CAPS Take 1 capsule by mouth 2 (two) times daily.     . furosemide (LASIX) 40 MG tablet Take 40 mg by mouth in the morning.    Marland Kitchen LORazepam (ATIVAN) 0.5 MG tablet Take 0.5 mg by mouth daily as needed for anxiety.    Marland Kitchen losartan (COZAAR) 50 MG tablet Take 50 mg by mouth daily.    . metoprolol tartrate (LOPRESSOR) 50 MG tablet Take 25 mg by mouth 2  (two) times daily.    . mirtazapine (REMERON) 15 MG tablet Take 15 mg by mouth at bedtime.    . Multiple Vitamins-Minerals (ONE-A-DAY WOMENS PO) Take 1 tablet by mouth daily.    . Multiple Vitamins-Minerals (PRESERVISION AREDS 2) CAPS Take 1 capsule by mouth 2 (two) times daily.    Marland Kitchen PRADAXA 150 MG CAPS capsule Take 150 mg by mouth 2 (two) times daily.     No facility-administered medications prior to visit.    Review of Systems;  Patient denies headache, fevers, malaise, unintentional weight loss, skin rash, eye pain, sinus congestion and sinus pain, sore throat, dysphagia,  hemoptysis , cough, dyspnea, wheezing, chest pain, palpitations, orthopnea, edema, abdominal pain, nausea, melena, diarrhea, constipation, flank pain, dysuria, hematuria, urinary  Frequency, nocturia, numbness, tingling, seizures,  Focal weakness, Loss of consciousness,  Tremor, insomnia, depression, anxiety, and suicidal ideation.      Objective:  BP 140/76 (BP Location: Right Arm, Patient Position: Sitting, Cuff Size: Normal)   Pulse 60   Temp (!) 97.4 F (36.3 C) (Oral)   Resp 16   Wt 117 lb 12.8 oz (53.4 kg)   BMI 19.60 kg/m   BP Readings from Last 3 Encounters:  04/05/20 140/76  03/29/20 97/67  03/07/20 134/80    Wt Readings from Last 3 Encounters:  04/05/20 117 lb 12.8 oz (53.4 kg)  03/29/20 115 lb 1.3 oz (52.2 kg)  03/07/20 116 lb (52.6 kg)    General appearance: alert, cooperative and appears stated age Scalp:  Laceration on crown of head with matted blood, 8 staples in place. Ears: normal TM's and external ear canals both ears Throat: lips, mucosa, and tongue normal; teeth and gums normal Neck: no adenopathy, no carotid bruit, supple, symmetrical, trachea midline and thyroid not enlarged, symmetric, no tenderness/mass/nodules Back: symmetric, no curvature. ROM normal. No CVA tenderness. Lungs: clear to auscultation bilaterally Heart: regular rate and rhythm, S1, S2 normal, no murmur, click,  rub or gallop Abdomen: soft, non-tender; bowel sounds normal; no masses,  no organomegaly Pulses: 2+ and symmetric Skin: Skin color, texture, turgor normal. No rashes or lesions Lymph nodes: Cervical, supraclavicular, and axillary nodes normal. Neuro:  awake and interactive with normal mood and affect. Higher cortical functions are normal. Speech is clear without word-finding difficulty or dysarthria. Extraocular movements are intact. Visual fields of both eyes are grossly intact. Sensation to light touch is grossly intact bilaterally of upper and lower extremities. Motor examination shows 4+/5 symmetric hand grip and upper extremity and  5/5 lower extremity strength. There is no pronation or drift. Gait is non-ataxic    Lab Results  Component Value Date   HGBA1C 5.3 12/18/2014    Lab Results  Component Value Date   CREATININE 1.70 (H) 03/29/2020   CREATININE 1.24 (H) 03/29/2020   CREATININE 1.09 02/16/2020    Lab Results  Component Value Date   WBC 7.1 03/29/2020   HGB 11.9 (L) 03/29/2020   HCT 35.0 (L) 03/29/2020   PLT 248 03/29/2020   GLUCOSE 89 03/29/2020   CHOL 216 (H) 01/11/2019   TRIG 116.0 01/11/2019   HDL 88.90 01/11/2019   LDLDIRECT 151.4 02/01/2012   LDLCALC 104 (H) 01/11/2019   ALT 11 03/29/2020   AST 22 03/29/2020   NA 137 03/29/2020   K 4.5 03/29/2020   CL 105 03/29/2020   CREATININE 1.70 (H) 03/29/2020   BUN 29 (H) 03/29/2020   CO2 18 (L) 03/29/2020   TSH 3.12 02/09/2020   INR 1.0 03/29/2020   HGBA1C 5.3 12/18/2014    CT HEAD WO CONTRAST  Result Date: 03/29/2020 CLINICAL DATA:  Critical poly trauma EXAM: CT HEAD WITHOUT CONTRAST CT CERVICAL SPINE WITHOUT CONTRAST TECHNIQUE: Multidetector CT imaging of the head and cervical spine was performed following the standard protocol without intravenous contrast. Multiplanar CT image reconstructions of the cervical spine were also generated. COMPARISON:  CT head 03/06/2012 FINDINGS: CT HEAD FINDINGS Brain: Diffuse  cerebral atrophy. Ventricular dilatation consistent with central atrophy. Low-attenuation changes in the deep white matter consistent with small vessel ischemia. No abnormal extra-axial fluid collections. No mass effect or midline shift. Gray-white matter junctions are distinct. Basal cisterns are not effaced. No acute intracranial hemorrhage. Vascular: Intracranial arterial vascular calcifications are present. Skull: The calvarium appears intact. Subcutaneous scalp hematoma and laceration over the left posterior parietal region. Skin clips in the left scalp. Sinuses/Orbits: Paranasal sinuses and mastoid air cells are clear. Other: None. CT CERVICAL SPINE FINDINGS Alignment: Straightening of usual cervical lordosis without anterior subluxation. Normal alignment of the posterior elements. Changes likely represent positional artifact although muscle spasm could also have this appearance. C1-2 articulation appears intact. Skull base and vertebrae: Skull base appears intact. No vertebral compression deformities. No focal bone lesion or bone destruction. Bone cortex appears intact. Degenerative changes noted in the left temporomandibular joint. Soft tissues and spinal canal: No prevertebral soft tissue swelling. No abnormal paraspinal soft tissue mass or infiltration. Vascular calcifications. Disc levels: Degenerative changes throughout the cervical spine with narrowed interspaces and endplate hypertrophic change. Degenerative changes in the facet joints. Upper chest: Visualized lung apices are clear. Other: None. IMPRESSION: 1. No acute intracranial abnormalities. Chronic atrophy and small vessel ischemic changes. 2. Subcutaneous scalp hematoma and laceration over the left posterior parietal region. 3. Nonspecific straightening of usual cervical lordosis. Degenerative changes throughout the cervical spine. No acute displaced fractures identified. Electronically Signed   By: Lucienne Capers M.D.   On: 03/29/2020 20:57     CT Cervical Spine Wo Contrast  Result Date: 03/29/2020 CLINICAL DATA:  Critical poly trauma EXAM: CT HEAD WITHOUT CONTRAST CT CERVICAL SPINE WITHOUT CONTRAST TECHNIQUE: Multidetector CT imaging of the head and cervical spine was performed following the standard protocol without intravenous contrast. Multiplanar CT image reconstructions of the cervical spine were also generated. COMPARISON:  CT head 03/06/2012 FINDINGS: CT HEAD FINDINGS Brain: Diffuse cerebral atrophy. Ventricular dilatation consistent with central atrophy. Low-attenuation changes in the deep white matter consistent with small vessel ischemia. No abnormal extra-axial fluid collections. No mass effect  or midline shift. Gray-white matter junctions are distinct. Basal cisterns are not effaced. No acute intracranial hemorrhage. Vascular: Intracranial arterial vascular calcifications are present. Skull: The calvarium appears intact. Subcutaneous scalp hematoma and laceration over the left posterior parietal region. Skin clips in the left scalp. Sinuses/Orbits: Paranasal sinuses and mastoid air cells are clear. Other: None. CT CERVICAL SPINE FINDINGS Alignment: Straightening of usual cervical lordosis without anterior subluxation. Normal alignment of the posterior elements. Changes likely represent positional artifact although muscle spasm could also have this appearance. C1-2 articulation appears intact. Skull base and vertebrae: Skull base appears intact. No vertebral compression deformities. No focal bone lesion or bone destruction. Bone cortex appears intact. Degenerative changes noted in the left temporomandibular joint. Soft tissues and spinal canal: No prevertebral soft tissue swelling. No abnormal paraspinal soft tissue mass or infiltration. Vascular calcifications. Disc levels: Degenerative changes throughout the cervical spine with narrowed interspaces and endplate hypertrophic change. Degenerative changes in the facet joints. Upper chest:  Visualized lung apices are clear. Other: None. IMPRESSION: 1. No acute intracranial abnormalities. Chronic atrophy and small vessel ischemic changes. 2. Subcutaneous scalp hematoma and laceration over the left posterior parietal region. 3. Nonspecific straightening of usual cervical lordosis. Degenerative changes throughout the cervical spine. No acute displaced fractures identified. Electronically Signed   By: Lucienne Capers M.D.   On: 03/29/2020 20:57   ECHOCARDIOGRAM COMPLETE  Result Date: 03/29/2020    ECHOCARDIOGRAM REPORT   Patient Name:   ZLATY ALEXA Date of Exam: 03/29/2020 Medical Rec #:  [id number]          Height:       66.0 in Accession #:    [id number]         Weight:       116.0 lb Date of Birth:  1941-03-03          BSA:          1.587 m Patient Age:    72 years           BP:           134/80 mmHg Patient Gender: F                  HR:           72 bpm. Exam Location:  [facility] Procedure: 2D Echo, Cardiac Doppler and Color Doppler Indications:    R06.02 SOB  History:        Patient has prior history of Echocardiogram examinations, most                 recent 09/11/2017. Pulmonary HTN and Carotid Disease,                 Arrythmias:Atrial Fibrillation, Signs/Symptoms:Shortness of                 Breath; Risk Factors:Hypertension, Dyslipidemia and Former                 Smoker.  Sonographer:    Pilar Jarvis RDMS, RVT, RDCS Referring Phys: [id number]  Sonographer Comments: Technically difficult study due to poor echo windows. IMPRESSIONS  1. Left ventricular ejection fraction, by estimation, is 60 to 65%. The left ventricle has normal function. The left ventricle has no regional wall motion abnormalities. Left ventricular diastolic parameters are consistent with Grade II diastolic dysfunction (pseudonormalization).  2. Right ventricular systolic function is normal. The right ventricular size is normal. There is mildly elevated pulmonary artery systolic pressure. The  estimated right ventricular systolic pressure is 49.8 mmHg.  3. Left atrial size was moderately dilated.  4. Mild mitral valve regurgitation. FINDINGS  Left Ventricle: Left ventricular ejection fraction, by estimation, is 60 to 65%. The left ventricle has normal function. The left ventricle has no regional wall motion abnormalities. The left ventricular internal cavity size was normal in size. There is  no left ventricular hypertrophy. Left ventricular diastolic parameters are consistent with Grade II diastolic dysfunction (pseudonormalization). Right Ventricle: The right ventricular size is normal. No increase in right ventricular wall thickness. Right ventricular systolic function is normal. There is mildly elevated pulmonary artery systolic pressure. The tricuspid regurgitant velocity is 3.15  m/s, and with an assumed right atrial pressure of 5 mmHg, the estimated right ventricular systolic pressure is 26.4 mmHg. Left Atrium: Left atrial size was moderately dilated. Right Atrium: Right atrial size was normal in size. Pericardium: There is no evidence of pericardial effusion. Mitral Valve: The mitral valve is normal in structure. Mild mitral annular calcification. Mild mitral valve regurgitation. No evidence of mitral valve stenosis. Tricuspid Valve: The tricuspid valve is normal in structure. Tricuspid valve regurgitation is mild . No evidence of tricuspid stenosis. Aortic Valve: The aortic valve is normal in structure. Aortic valve regurgitation is not visualized. No aortic stenosis is present. Aortic valve mean gradient measures 3.0 mmHg. Aortic valve peak gradient measures 5.9 mmHg. Aortic valve area, by VTI measures 1.86 cm. Pulmonic Valve: The pulmonic valve was normal in structure. Pulmonic valve regurgitation is not visualized. No evidence of pulmonic stenosis. Aorta: The aortic root is normal in size and structure. Venous: The inferior vena cava is normal in size with greater than 50% respiratory  variability, suggesting right atrial pressure of 3 mmHg. IAS/Shunts: No atrial level shunt detected by color flow Doppler.  LEFT VENTRICLE PLAX 2D LVIDd:         4.70 cm     Diastology LVIDs:         3.10 cm     LV e' medial:    6.31 cm/s LV PW:         0.80 cm     LV E/e' medial:  14.1 LV IVS:        1.00 cm     LV e' lateral:   8.16 cm/s LVOT diam:     2.00 cm     LV E/e' lateral: 10.9 LV SV:         48 LV SV Index:   30 LVOT Area:     3.14 cm  LV Volumes (MOD) LV vol d, MOD A2C: 77.8 ml LV vol d, MOD A4C: 64.5 ml LV vol s, MOD A2C: 35.6 ml LV vol s, MOD A4C: 27.7 ml LV SV MOD A2C:     42.2 ml LV SV MOD A4C:     64.5 ml LV SV MOD BP:      38.7 ml RIGHT VENTRICLE             IVC RV Basal diam:  3.10 cm     IVC diam: 2.00 cm RV S prime:     12.50 cm/s TAPSE (M-mode): 2.4 cm LEFT ATRIUM             Index       RIGHT ATRIUM           Index LA diam:        4.00 cm 2.52 cm/m  RA Area:     15.10 cm LA Vol (  A2C):   53.5 ml 33.72 ml/m RA Volume:   35.90 ml  22.63 ml/m LA Vol (A4C):   83.1 ml 52.38 ml/m LA Biplane Vol: 70.8 ml 44.63 ml/m  AORTIC VALVE AV Area (Vmax):    1.89 cm AV Area (Vmean):   1.71 cm AV Area (VTI):     1.86 cm AV Vmax:           121.50 cm/s AV Vmean:          78.100 cm/s AV VTI:            0.256 m AV Peak Grad:      5.9 mmHg AV Mean Grad:      3.0 mmHg LVOT Vmax:         73.10 cm/s LVOT Vmean:        42.400 cm/s LVOT VTI:          0.152 m LVOT/AV VTI ratio: 0.59  AORTA Ao Root diam: 3.00 cm Ao Asc diam:  3.20 cm Ao Arch diam: 2.4 cm MITRAL VALVE               TRICUSPID VALVE MV Area (PHT): 4.24 cm    TR Peak grad:   39.7 mmHg MV Decel Time: 179 msec    TR Vmax:        315.00 cm/s MV E velocity: 88.70 cm/s MV A velocity: 68.60 cm/s  SHUNTS MV E/A ratio:  1.29        Systemic VTI:  0.15 m                            Systemic Diam: 2.00 cm Ida Rogue MD Electronically signed by Ida Rogue MD Signature Date/Time: 03/29/2020/6:31:32 PM    Final     Assessment & Plan:   Problem List  Items Addressed This Visit      Unprioritized   Anemia    Macrocytic, worsening,  Normal  B12/folate levels in July  .  Given worsening GFR, ,  Anemia and weight loss,  May be due to alcohol abuse vs  multiple myeloma.  Will defer to her PCP       Relevant Orders   Iron, TIBC and Ferritin Panel   CBC with Differential/Platelet   Scalp laceration, subsequent encounter    Topical numbing medicine applied to scalp per patinet request,  8 staples removed from posterior crown.  Advised to refrain from washing hair for one more day      Alcohol abuse    I suspect this has been ongoing given her ETOH level , poor balance, and weight loss of 10 lbs over the past year.  Will have her follow up with DR Caryl Bis,  Adding BAL to repeat labs next week      Relevant Orders   Ethanol   Chronic kidney disease, stage 3, mod decreased GFR    Has not had a normal GFR since Dec 2020.  Patient taking furosemide 40 mg daily and dose was transiently increased to include afternoon dose of 20 mg for unclear reasons.  Repeat BMET next week .          I am having Madden S. Luper maintain her multivitamin, PreserVision AREDS 2, Vitamin D3, allopurinol, metoprolol tartrate, mirtazapine, losartan, flecainide, Pradaxa, LORazepam, furosemide, Multiple Vitamins-Minerals (ONE-A-DAY WOMENS PO), Cholecalciferol (VITAMIN D-3 PO), PreserVision AREDS 2, metoprolol tartrate, Pradaxa, losartan, flecainide, mirtazapine, LORazepam, Vitamin D-3, and furosemide.  No orders of the  defined types were placed in this encounter.   There are no discontinued medications.  Follow-up: No follow-ups on file.   Crecencio Mc, MD

## 2020-04-05 NOTE — Assessment & Plan Note (Addendum)
I suspect this has been ongoing given her ETOH level , poor balance, and weight loss of 10 lbs over the past year.  Will have her follow up with DR Caryl Bis,  Adding BAL to repeat labs next week

## 2020-04-05 NOTE — Telephone Encounter (Signed)
Had to put here in a 3:15 same day slot because she had to be seen  In 2 weeks no other appointments.

## 2020-04-05 NOTE — Assessment & Plan Note (Addendum)
Macrocytic, worsening,  Normal  B12/folate levels in July  .  Given worsening GFR, ,  Anemia and weight loss,  May be due to alcohol abuse vs  multiple myeloma.  Will defer to her PCP

## 2020-04-05 NOTE — Assessment & Plan Note (Signed)
Has not had a normal GFR since Dec 2020.  Patient taking furosemide 40 mg daily and dose was transiently increased to include afternoon dose of 20 mg for unclear reasons.  Repeat BMET next week .

## 2020-04-05 NOTE — Assessment & Plan Note (Signed)
Topical numbing medicine applied to scalp per patinet request,  8 staples removed from posterior crown.  Advised to refrain from washing hair for one more day

## 2020-04-05 NOTE — Patient Instructions (Addendum)
I removed 8 stitches from your scalp today.  Do not wash your hair for another 24 hours.   You should follow up with Dr Winfred Burn to address your cerebral atherosclerosis,  Your ongoing weight loss and declining kidney function   I want you to reduce your alcohol intake as much as possible,  Because your weight loss makes you more susceptible to the effects of alcohol

## 2020-04-09 ENCOUNTER — Other Ambulatory Visit
Admission: RE | Admit: 2020-04-09 | Discharge: 2020-04-09 | Disposition: A | Discharge status: Home / Self Care | Payer: Medicare Other | Attending: Internal Medicine | Admitting: Internal Medicine

## 2020-04-09 ENCOUNTER — Other Ambulatory Visit
Admission: RE | Admit: 2020-04-09 | Discharge: 2020-04-09 | Disposition: A | Discharge status: Home / Self Care | Payer: Medicare Other | Source: Home / Self Care | Attending: Nurse Practitioner | Admitting: Nurse Practitioner

## 2020-04-09 DIAGNOSIS — F1010 Alcohol abuse, uncomplicated: Secondary | ICD-10-CM | POA: Diagnosis present

## 2020-04-09 DIAGNOSIS — I4819 Other persistent atrial fibrillation: Secondary | ICD-10-CM | POA: Insufficient documentation

## 2020-04-09 DIAGNOSIS — D649 Anemia, unspecified: Secondary | ICD-10-CM | POA: Diagnosis present

## 2020-04-09 LAB — BASIC METABOLIC PANEL
Anion gap: 10 (ref 5–15)
BUN: 28 mg/dL — ABNORMAL HIGH (ref 8–23)
CO2: 26 mmol/L (ref 22–32)
Calcium: 9.4 mg/dL (ref 8.9–10.3)
Chloride: 104 mmol/L (ref 98–111)
Creatinine, Ser: 1.11 mg/dL — ABNORMAL HIGH (ref 0.44–1.00)
GFR calc Af Amer: 54 mL/min — ABNORMAL LOW (ref >60)
GFR calc non Af Amer: 47 mL/min — ABNORMAL LOW (ref >60)
Glucose, Bld: 99 mg/dL (ref 70–99)
Potassium: 5 mmol/L (ref 3.5–5.1)
Sodium: 140 mmol/L (ref 135–145)

## 2020-04-09 LAB — CBC WITH DIFFERENTIAL/PLATELET
Abs Immature Granulocytes: 0.01 10*3/uL (ref 0.00–0.07)
Basophils Absolute: 0 10*3/uL (ref 0.0–0.1)
Basophils Relative: 0 %
Eosinophils Absolute: 0.1 10*3/uL (ref 0.0–0.5)
Eosinophils Relative: 2 %
HCT: 26.1 % — ABNORMAL LOW (ref 36.0–46.0)
Hemoglobin: 8.5 g/dL — ABNORMAL LOW (ref 12.0–15.0)
Immature Granulocytes: 0 %
Lymphocytes Relative: 27 %
Lymphs Abs: 1.3 10*3/uL (ref 0.7–4.0)
MCH: 38.1 pg — ABNORMAL HIGH (ref 26.0–34.0)
MCHC: 32.6 g/dL (ref 30.0–36.0)
MCV: 117 fL — ABNORMAL HIGH (ref 80.0–100.0)
Monocytes Absolute: 0.7 10*3/uL (ref 0.1–1.0)
Monocytes Relative: 14 %
Neutro Abs: 2.7 10*3/uL (ref 1.7–7.7)
Neutrophils Relative %: 57 %
Platelets: 364 10*3/uL (ref 150–400)
RBC: 2.23 MIL/uL — ABNORMAL LOW (ref 3.87–5.11)
RDW: 16 % — ABNORMAL HIGH (ref 11.5–15.5)
Smear Review: NORMAL
WBC: 4.8 10*3/uL (ref 4.0–10.5)
nRBC: 0 % (ref 0.0–0.2)

## 2020-04-09 LAB — IRON AND TIBC
Iron: 69 ug/dL (ref 28–170)
Saturation Ratios: 25 % (ref 10.4–31.8)
TIBC: 279 ug/dL (ref 250–450)
UIBC: 210 ug/dL

## 2020-04-09 LAB — ETHANOL: Alcohol, Ethyl (B): <10 mg/dL (ref <10)

## 2020-04-09 LAB — FERRITIN: Ferritin: 81 ng/mL (ref 11–307)

## 2020-04-09 NOTE — Addendum Note (Signed)
Addended by: Santiago Bur on: 04/09/2020 09:35 AM   Modules accepted: Orders

## 2020-04-09 NOTE — Progress Notes (Signed)
Her hemoglobin has dropped quite a  bit and is now 8.5.  her iron stores are normal, so the cause is unclear but needs to be worked up . Please make sure she has a follow up with Dr Caryl Bis , me, or another provider this week

## 2020-04-09 NOTE — Addendum Note (Signed)
Addended by: Santiago Bur on: 04/09/2020 09:36 AM   Modules accepted: Orders

## 2020-04-11 ENCOUNTER — Ambulatory Visit: Payer: Medicare Other

## 2020-04-11 ENCOUNTER — Other Ambulatory Visit: Payer: Self-pay

## 2020-04-11 DIAGNOSIS — M6281 Muscle weakness (generalized): Secondary | ICD-10-CM | POA: Diagnosis not present

## 2020-04-11 DIAGNOSIS — M545 Low back pain, unspecified: Secondary | ICD-10-CM

## 2020-04-11 DIAGNOSIS — R2681 Unsteadiness on feet: Secondary | ICD-10-CM

## 2020-04-11 DIAGNOSIS — Z9181 History of falling: Secondary | ICD-10-CM | POA: Diagnosis not present

## 2020-04-11 DIAGNOSIS — M25551 Pain in right hip: Secondary | ICD-10-CM

## 2020-04-11 NOTE — Therapy (Signed)
[facility] [address] [place], [address] Phone: [phone]   Fax:  [phone]  Physical Therapy Treatment  Patient Details  Name: [name] MRN: [id number] Date of Birth: 11/13/1940 Referring Provider (PT): Tamala Julian   Encounter Date: 04/11/2020   PT End of Session - 04/11/20 1518    Visit Number 43    Number of Visits 46    Date for PT Re-Evaluation 03/28/20    Authorization Type 7/10    PT Start Time 1515    PT Stop Time 1600    PT Time Calculation (min) 45 min    Equipment Utilized During Treatment Gait belt    Activity Tolerance Patient tolerated treatment well;No increased pain    Behavior During Therapy WFL for tasks assessed/performed           Past Medical History:  Diagnosis Date  . (HFpEF) heart failure with preserved ejection fraction ([place])    a. 08/2017 Echo: EF 55-60%, no rwma, mild MR, mildly dil LA, nl RV fxn.  . Breast cancer ([place]) 2001   left breast  . Cancer ([place]) 2001   left breast ca  . Carotid arterial disease ([place])    a. 10/2004 s/p L CEA; b. 12/2015 Carotid U/S: RICA 1-39%; b. LICA patent CEA site.  Marland Kitchen GERD (gastroesophageal reflux disease)   . Hyperlipidemia   . Hypertension   . Osteoarthritis, multiple sites   . Osteopenia   . Persistent atrial fibrillation ([place])    a. Dx 08/2017; b. CHA2DS2VASc = 6-->Pradaxa; c. 09/2017 Successful DCCV (second shock - 200J); d. 10/2017 Recurrent Afib-->flecainide started 11/2017.  Marland Kitchen Personal history of radiation therapy 2001   left breast ca  . Psoriasis     Past Surgical History:  Procedure Laterality Date  . ABDOMINAL HYSTERECTOMY    . ANKLE FRACTURE SURGERY  4/08   left---hardware still in place  . 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 BREAST EXCISIONAL BIOPSY Left yrs ago   benign  . BREAST LUMPECTOMY Left 2001   f/u radiation  . CARDIOVERSION N/A 10/04/2017   Procedure: CARDIOVERSION;  Surgeon: Wellington Hampshire, MD;  Location: [facility] ORS;  Service: Cardiovascular;  Laterality: N/A;  . CARDIOVERSION N/A 12/16/2017   Procedure: CARDIOVERSION;  Surgeon: [name], MD;  Location: [facility] ORS;  Service: Cardiovascular;  Laterality: N/A;  . CAROTID ENDARTERECTOMY Left 10/23/2004  . FRACTURE SURGERY    . OOPHORECTOMY    . SHOULDER SURGERY  6/07   left  . TONSILLECTOMY AND ADENOIDECTOMY    . TOTAL HIP ARTHROPLASTY  2004   right    There were no vitals filed for this visit.   Subjective Assessment - 04/11/20 1517    Subjective Patient reports no issues at todays session.    Pertinent History Patient previously seen for balance difficulties, shoulder pain and the right, and low back pain.  Patient reports no falls in the past six months. Patient reports pain has not improved for the past month and has been about the same since the onset of pain.    Limitations Sitting;Standing;Walking    Patient Stated Goals To improve balance when walking, driving pain    Currently in Pain? Yes    Pain Score 2     Pain Onset More than a month ago          Therapeutic Exercise  Total Gym Level 26 3x15 Leg swings frontal and sagittal  2x15 bilat each  Lunges in standing - 2 x 20  Step-Up 6inch Step x10 B  Standing Hip Abduction x15 YTB Standing Hip Extension x15 YTB SLS on AirEx Pad 2x30sec B  Hip flexion at hip machine - 2 x 12 B 25#  SPC gait training x464ft to work on endurance    Performed exercises to improve LE strength, endurance, and gait       PT Education - 04/11/20 1518    Education Details form/technique with exercise    Person(s) Educated Patient    Methods Explanation;Demonstration    Comprehension Verbalized understanding;Returned demonstration            PT Short Term Goals - 10/26/19 1540      PT SHORT TERM GOAL #1   Title Patient will have a worst pain score of 6/10 to better be able to drive with less overall pain    Baseline 8/10; 10/26/2019: 6/10    Time 3     Period Weeks    Status On-going    Target Date 10/09/19             PT Long Term Goals - 02/15/20 1306      PT LONG TERM GOAL #1   Title Patient will be independent with HEP to continue benefits of therapy after discharge.     Baseline Dependent with form and technique for exercises; 10/26/2019: cueing needed to complete HEP; 12/06/2019:  performing exercises at home    Time 6    Period Weeks      PT LONG TERM GOAL #2   Title Patient will improve 15mWT to over 1 m/s to indicate increased safety with ambulating in a community environment.    Baseline .66 m/s; 10/26/2019: .75 m/s ; 12/06/2019: . 8 m/s; 01/16/20: .91 m/s; 02/15/20: 1.4m/s    Time 6    Period Weeks    Status Achieved      PT LONG TERM GOAL #3   Title Patient will improve FGA to 63 to indicate significant functional improvement of R hip function.    Baseline FGA: 44; 10/26/2019: FGA: 48; 5/192/2019: 53;    Time 6    Period Weeks    Status Deferred      PT LONG TERM GOAL #4   Title Patient will be able to balance for 10sec with SLS to improve static balance and ability to get dressed when in standing    Baseline 2.5 sec B; 10/26/2019: 2.5 sec; 12/06/2019: 3 sec; 01/16/20: 4 sec; 02/15/20 6sec    Time 6    Period Weeks    Status On-going      PT LONG TERM GOAL #5   Title Patient will have a worst hip pain of a 3/10 over the past week to indicate functional improvement with transferring and driving    Baseline worst pain: 8/10; 10/26/2019: 6/10; 12/06/2019: 3/10, 01/16/20: 0/10    Time 6    Period Weeks    Status Achieved      PT LONG TERM GOAL #6   Title Patient will improve LEFS score by 12 points to show an improvement in functional movements and mobility.    Baseline 46; 02/15/20: 40    Time 6    Period Weeks    Status On-going                 Plan - 04/11/20 1542    Clinical Impression Statement Focused on improving patients LE strength along with working on  SLS balance during session.  Patient continues to  make improvements in strength and SLS demonstrated by patient's ability to maintain balance during SLS on airex pad without an episode of LOB which is an improvement when compared to previous sessions. Patient will continue to benefit from skilled therapy to return prior level of function and address remaining limitations.    Personal Factors and Comorbidities Age;Comorbidity 2    Comorbidities hip fx, RA    Examination-Activity Limitations Lift;Squat;Stand;Transfers    Stability/Clinical Decision Making Evolving/Moderate complexity    Clinical Decision Making Moderate    Rehab Potential Good    PT Frequency 2x / week    PT Duration 6 weeks    PT Treatment/Interventions Neuromuscular re-education;Stair training;Moist Heat;Cryotherapy;Iontophoresis 4mg /ml Dexamethasone;Electrical Stimulation;DME Instruction;Therapeutic activities;Therapeutic exercise;Balance training;Manual techniques;Patient/family education;Passive range of motion;Dry needling;Joint Manipulations    PT Next Visit Plan progress hip strengthening, use of SPC    PT Home Exercise Plan See education section    Consulted and Agree with Plan of Care Patient           Patient will benefit from skilled therapeutic intervention in order to improve the following deficits and impairments:  Abnormal gait, Decreased activity tolerance, Decreased endurance, Decreased range of motion, Decreased strength, Decreased balance, Difficulty walking, Decreased cognition, Pain, Decreased coordination, Increased muscle spasms, Postural dysfunction, Hypomobility  Visit Diagnosis: Unsteadiness on feet  History of falling  Pain in right hip  Muscle weakness (generalized)  Acute bilateral low back pain without sciatica     Problem List Patient Active Problem List   Diagnosis Date Noted  . Scalp laceration, subsequent encounter 04/05/2020  . Alcohol abuse 04/05/2020  . Chronic kidney disease, stage 3, mod decreased GFR 04/05/2020  . DOE  (dyspnea on exertion) 02/09/2020  . Lethargy 02/09/2020  . Anxiety 01/23/2020  . Closed right hip fracture ([place]) 09/20/2019  . Gout 05/18/2019  . Clavi 03/09/2019  . Right hip pain 07/08/2018  . Chronic bilateral low back pain without sciatica 12/31/2017  . Atrial fibrillation ([place]) 09/10/2017  . Trapezius strain, right, initial encounter 09/10/2017  . Depression, major, single episode, mild ([place]) 09/10/2017  . Chronic venous insufficiency 09/09/2017  . Lymphedema 09/09/2017  . Pain of toe of left foot 08/17/2017  . Leg swelling 07/31/2017  . Dry eyes 07/06/2017  . Pain and swelling of lower extremity, right 03/29/2017  . Right shoulder pain 03/29/2017  . Diarrhea 05/18/2016  . Fall 05/01/2016  . Leg weakness, bilateral 12/30/2015  . Anemia 05/19/2013  . Psoriatic arthritis ([place]) 02/03/2013  . Right hand pain 08/05/2012  . Psoriasis   . Osteoarthritis, multiple sites   . History of breast cancer 10/28/2011  . GERD (gastroesophageal reflux disease)   . Hypertension   . Osteopenia   . Carotid stenosis 05/27/2011   3:58 PM, 04/11/20 Margarito Liner, SPT Student Physical Therapist [place]  [phone]  Margarito Liner 04/11/2020, 3:43 PM  [facility] [address], [place], [address] Phone: [phone]   Fax:  [phone]  Name: [name] MRN: [id number] Date of Birth: 1940-07-23

## 2020-04-15 ENCOUNTER — Encounter: Payer: Self-pay | Admitting: Family Medicine

## 2020-04-15 ENCOUNTER — Other Ambulatory Visit: Payer: Self-pay

## 2020-04-15 ENCOUNTER — Ambulatory Visit: Payer: Medicare Other

## 2020-04-15 ENCOUNTER — Ambulatory Visit (INDEPENDENT_AMBULATORY_CARE_PROVIDER_SITE_OTHER): Payer: Medicare Other

## 2020-04-15 ENCOUNTER — Ambulatory Visit (INDEPENDENT_AMBULATORY_CARE_PROVIDER_SITE_OTHER): Payer: Medicare Other | Admitting: Family Medicine

## 2020-04-15 VITALS — BP 120/70 | HR 60 | Temp 98.3°F | Ht 66.0 in | Wt 117.4 lb

## 2020-04-15 DIAGNOSIS — Z23 Encounter for immunization: Secondary | ICD-10-CM

## 2020-04-15 DIAGNOSIS — I4891 Unspecified atrial fibrillation: Secondary | ICD-10-CM | POA: Diagnosis not present

## 2020-04-15 DIAGNOSIS — N1832 Chronic kidney disease, stage 3b: Secondary | ICD-10-CM

## 2020-04-15 DIAGNOSIS — F1010 Alcohol abuse, uncomplicated: Secondary | ICD-10-CM

## 2020-04-15 DIAGNOSIS — I6523 Occlusion and stenosis of bilateral carotid arteries: Secondary | ICD-10-CM | POA: Diagnosis not present

## 2020-04-15 DIAGNOSIS — G8929 Other chronic pain: Secondary | ICD-10-CM

## 2020-04-15 DIAGNOSIS — D649 Anemia, unspecified: Secondary | ICD-10-CM

## 2020-04-15 DIAGNOSIS — M542 Cervicalgia: Secondary | ICD-10-CM | POA: Diagnosis not present

## 2020-04-15 NOTE — Assessment & Plan Note (Signed)
Kidney function has improved back to near her baseline.  Her kidney function brings up the possibility of multiple myeloma as a cause of her anemia and macrocytosis.  She will discontinue the afternoon dose of her Lasix.

## 2020-04-15 NOTE — Assessment & Plan Note (Signed)
X-ray today to evaluate this further.

## 2020-04-15 NOTE — Assessment & Plan Note (Signed)
Given the constellation of findings I am concerned that this could represent multiple myeloma.  Advised that she is going to have to see hematology that we can start the work-up with labs as outlined below.

## 2020-04-15 NOTE — Assessment & Plan Note (Signed)
Sinus rhythm today.  Recent episode of palpitations.  Advised that she has recurrent issues with palpitations that do not resolve quickly she needs to be evaluated while having the palpitations.  She will continue management this through cardiology.

## 2020-04-15 NOTE — Patient Instructions (Signed)
Nice to see you. We will get lab work today and contact you with the results.  We will get an x-ray as well.  

## 2020-04-15 NOTE — Assessment & Plan Note (Signed)
Patient has been decreasing her alcohol intake.  Advised that she should not increase it again.

## 2020-04-15 NOTE — Progress Notes (Signed)
[name], MD Phone: [phone]  [name] is a 79 y.o. female who presents today for follow-up.  Anemia/macrocytosis: Patient noted to have gotten significantly more anemic recently.  She has only recently been anemic though her hemoglobin was most recently 8.5.  Her macrocytosis is progressively worsened.  She notes she was evaluated for this many years ago by hematology and no cause was found.  She has previously declined hematology referral when I have suggested it.  She has had no bleeding issues.  She has had mild weight loss though has been trying to gain weight and has gained some recently.  Occasional bone aches though no significant bone pain.  No night sweats.  She has decreased her alcohol intake to 1 drink per day.  A. fib: Patient notes 1 episode of palpitations a little over a week ago.  She had a little bit of chest discomfort with this and some shortness of breath though it resolved by the next morning.  She did not seek medical attention for this.  No cough or congestion.  She is on flecainide and Pradaxa.  Chronic neck pain: This is been going on for at least 6 months intermittently.  Started out sharp at the base of her skull.  That has improved though she continues to intermittently have a sore neck.  No radiation.  No numbness or weakness.  Social History   Tobacco Use  Smoking Status Former Smoker  . Packs/day: 1.00  . Years: 40.00  . Pack years: 40.00  . Types: Cigarettes  . Quit date: 07/21/1995  . Years since quitting: 24.7  Smokeless Tobacco Never Used     ROS see history of present illness  Objective  Physical Exam Vitals:   04/15/20 1502  BP: 120/70  Pulse: 60  Temp: 98.3 F (36.8 C)  SpO2: 99%    BP Readings from Last 3 Encounters:  04/15/20 120/70  04/05/20 140/76  03/29/20 97/67   Wt Readings from Last 3 Encounters:  04/15/20 117 lb 6.4 oz (53.3 kg)  04/05/20 117 lb 12.8 oz (53.4 kg)  03/29/20 115 lb 1.3 oz (52.2 kg)     Physical Exam Constitutional:      General: She is not in acute distress.    Appearance: She is not diaphoretic.  Cardiovascular:     Rate and Rhythm: Normal rate and regular rhythm.     Heart sounds: Normal heart sounds.  Pulmonary:     Effort: Pulmonary effort is normal.     Breath sounds: Normal breath sounds.  Abdominal:     General: Bowel sounds are normal. There is no distension.     Palpations: Abdomen is soft. There is no hepatomegaly or splenomegaly.     Tenderness: There is no abdominal tenderness. There is no guarding or rebound.  Musculoskeletal:     Right lower leg: No edema.     Left lower leg: No edema.     Comments: No midline neck step-off, no midline neck tenderness, mild muscular tenderness in her neck posteriorly  Skin:    General: Skin is warm and dry.  Neurological:     Mental Status: She is alert.     Comments: 5/5 strength in bilateral biceps, triceps, grip, quads, hamstrings, plantar and dorsiflexion, sensation to light touch intact in bilateral UE and LE      Assessment/Plan: Please see individual problem list.  Alcohol abuse Patient has been decreasing her alcohol intake.  Advised that she should not increase it again.  Chronic kidney disease, stage 3, mod decreased GFR Kidney function has improved back to near her baseline.  Her kidney function brings up the possibility of multiple myeloma as a cause of her anemia and macrocytosis.  She will discontinue the afternoon dose of her Lasix.  Anemia Given the constellation of findings I am concerned that this could represent multiple myeloma.  Advised that she is going to have to see hematology that we can start the work-up with labs as outlined below.  Atrial fibrillation (HCC) Sinus rhythm today.  Recent episode of palpitations.  Advised that she has recurrent issues with palpitations that do not resolve quickly she needs to be evaluated while having the palpitations.  She will continue management  this through cardiology.  Chronic neck pain X-ray today to evaluate this further.   Orders Placed This Encounter  Procedures  . DG Cervical Spine Complete    Standing Status:   Future    Number of Occurrences:   1    Standing Expiration Date:   04/15/2021    Order Specific Question:   Reason for Exam (SYMPTOM  OR DIAGNOSIS REQUIRED)    Answer:   Chronic neck pain about 6 months in duration    Order Specific Question:   Preferred imaging location?    Answer:   [facility] Specific Question:   Radiology Contrast Protocol - do NOT remove file path    Answer:   [email or web address]  . Protein Electrophoresis, (serum)  . CBC w/Diff  . PE and FLC, Serum    No orders of the defined types were placed in this encounter.   [name] was seen today for follow-up.  Diagnoses and all orders for this visit:  Alcohol abuse  Stage 3b chronic kidney disease (HCC)  Anemia, unspecified type -     Protein Electrophoresis, (serum) -     CBC w/Diff -     PE and FLC, Serum  Atrial fibrillation, unspecified type (HCC)  Chronic neck pain -     DG Cervical Spine Complete; Future     This visit occurred during the SARS-CoV-2 public health emergency.  Safety protocols were in place, including screening questions prior to the visit, additional usage of staff PPE, and extensive cleaning of exam room while observing appropriate contact time as indicated for disinfecting solutions.    [name], MD [place]

## 2020-04-16 LAB — CBC WITH DIFFERENTIAL/PLATELET
Basophils Absolute: 0.1 10*3/uL (ref 0.0–0.1)
Basophils Relative: 1.4 % (ref 0.0–3.0)
Eosinophils Absolute: 0.1 10*3/uL (ref 0.0–0.7)
Eosinophils Relative: 1.1 % (ref 0.0–5.0)
HCT: 28.7 % — ABNORMAL LOW (ref 36.0–46.0)
Hemoglobin: 9.5 g/dL — ABNORMAL LOW (ref 12.0–15.0)
Lymphocytes Relative: 22.7 % (ref 12.0–46.0)
Lymphs Abs: 1.4 10*3/uL (ref 0.7–4.0)
MCHC: 33.2 g/dL (ref 30.0–36.0)
MCV: 118.2 fl — ABNORMAL HIGH (ref 78.0–100.0)
Monocytes Absolute: 0.8 10*3/uL (ref 0.1–1.0)
Monocytes Relative: 13.7 % — ABNORMAL HIGH (ref 3.0–12.0)
Neutro Abs: 3.7 10*3/uL (ref 1.4–7.7)
Neutrophils Relative %: 61.1 % (ref 43.0–77.0)
Platelets: 354 10*3/uL (ref 150.0–400.0)
RBC: 2.43 Mil/uL — ABNORMAL LOW (ref 3.87–5.11)
RDW: 15.8 % — ABNORMAL HIGH (ref 11.5–15.5)
WBC: 6 10*3/uL (ref 4.0–10.5)

## 2020-04-16 LAB — PE AND FLC, SERUM
A/G Ratio: 1.4 (ref 0.7–1.7)
Albumin ELP: 3.7 g/dL (ref 2.9–4.4)
Alpha 1: 0.3 g/dL (ref 0.0–0.4)
Alpha 2: 0.6 g/dL (ref 0.4–1.0)
Beta: 0.8 g/dL (ref 0.7–1.3)
Gamma Globulin: 1 g/dL (ref 0.4–1.8)
Globulin, Total: 2.7 g/dL (ref 2.2–3.9)
Ig Kappa Free Light Chain: 372.8 mg/L — ABNORMAL HIGH (ref 3.3–19.4)
Ig Lambda Free Light Chain: 26.8 mg/L — ABNORMAL HIGH (ref 5.7–26.3)
KAPPA/LAMBDA RATIO: 13.91 — ABNORMAL HIGH (ref 0.26–1.65)
M-Spike, %: 0.7 g/dL — ABNORMAL HIGH
Total Protein: 6.4 g/dL (ref 6.0–8.5)

## 2020-04-16 NOTE — Progress Notes (Signed)
[facility] lab order has been canceled

## 2020-04-18 ENCOUNTER — Other Ambulatory Visit: Payer: Self-pay

## 2020-04-18 ENCOUNTER — Ambulatory Visit: Payer: Medicare Other

## 2020-04-18 DIAGNOSIS — M6281 Muscle weakness (generalized): Secondary | ICD-10-CM | POA: Diagnosis not present

## 2020-04-18 DIAGNOSIS — M545 Low back pain, unspecified: Secondary | ICD-10-CM

## 2020-04-18 DIAGNOSIS — Z9181 History of falling: Secondary | ICD-10-CM

## 2020-04-18 DIAGNOSIS — M25551 Pain in right hip: Secondary | ICD-10-CM

## 2020-04-18 DIAGNOSIS — R2681 Unsteadiness on feet: Secondary | ICD-10-CM

## 2020-04-18 NOTE — Therapy (Deleted)
[facility] [address], [place], [address] Phone: [phone]   Fax:  [phone]  Physical Therapy Treatment/Discharge   Patient Details  Name: [name] MRN: [id number] Date of Birth: 1941/01/31 Referring Provider (PT): Tamala Julian   Encounter Date: 04/18/2020   PT End of Session - 04/18/20 1437    Visit Number 44    Number of Visits 46    Date for PT Re-Evaluation 03/28/20    Authorization Type 8/10    PT Start Time 4270    PT Stop Time 1515    PT Time Calculation (min) 39 min    Equipment Utilized During Treatment Gait belt    Activity Tolerance Patient tolerated treatment well;No increased pain    Behavior During Therapy WFL for tasks assessed/performed           Past Medical History:  Diagnosis Date  . (HFpEF) heart failure with preserved ejection fraction ([place])    a. 08/2017 Echo: EF 55-60%, no rwma, mild MR, mildly dil LA, nl RV fxn.  . Breast cancer ([place]) 2001   left breast  . Cancer ([place]) 2001   left breast ca  . Carotid arterial disease ([place])    a. 10/2004 s/p L CEA; b. 12/2015 Carotid U/S: RICA 1-39%; b. LICA patent CEA site.  Marland Kitchen GERD (gastroesophageal reflux disease)   . Hyperlipidemia   . Hypertension   . Osteoarthritis, multiple sites   . Osteopenia   . Persistent atrial fibrillation ([place])    a. Dx 08/2017; b. CHA2DS2VASc = 6-->Pradaxa; c. 09/2017 Successful DCCV (second shock - 200J); d. 10/2017 Recurrent Afib-->flecainide started 11/2017.  Marland Kitchen Personal history of radiation therapy 2001   left breast ca  . Psoriasis     Past Surgical History:  Procedure Laterality Date  . ABDOMINAL HYSTERECTOMY    . ANKLE FRACTURE SURGERY  4/08   left---hardware still in place  . 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 BREAST EXCISIONAL BIOPSY Left yrs ago   benign  . BREAST LUMPECTOMY Left 2001   f/u radiation  . CARDIOVERSION N/A 10/04/2017   Procedure: CARDIOVERSION;  Surgeon:  Wellington Hampshire, MD;  Location: [facility] ORS;  Service: Cardiovascular;  Laterality: N/A;  . CARDIOVERSION N/A 12/16/2017   Procedure: CARDIOVERSION;  Surgeon: [name], MD;  Location: [facility] ORS;  Service: Cardiovascular;  Laterality: N/A;  . CAROTID ENDARTERECTOMY Left 10/23/2004  . FRACTURE SURGERY    . OOPHORECTOMY    . SHOULDER SURGERY  6/07   left  . TONSILLECTOMY AND ADENOIDECTOMY    . TOTAL HIP ARTHROPLASTY  2004   right    There were no vitals filed for this visit.   Subjective Assessment - 04/18/20 1436    Subjective Patient reports that she has neck pain that remains after 6 months but no other issues.    Pertinent History Patient previously seen for balance difficulties, shoulder pain and the right, and low back pain.  Patient reports no falls in the past six months. Patient reports pain has not improved for the past month and has been about the same since the onset of pain.    Limitations Sitting;Standing;Walking    Patient Stated Goals To improve balance when walking, driving pain    Currently in Pain? Yes    Pain Score 3     Pain Onset More than a month ago          Therapeutic Exercise (HEP)  SL Stance 3x10 sec Standing Hip Abduction x10 Standing Hip Extension x10 Standing Hip Flexion x10  STS x10         PT Education - 04/18/20 1437    Education Details Form/technique with exercise    Person(s) Educated Patient    Methods Explanation;Demonstration    Comprehension Verbalized understanding;Returned demonstration            PT Short Term Goals - 10/26/19 1540      PT SHORT TERM GOAL #1   Title Patient will have a worst pain score of 6/10 to better be able to drive with less overall pain    Baseline 8/10; 10/26/2019: 6/10    Time 3    Period Weeks    Status On-going    Target Date 10/09/19             PT Long Term Goals - 04/18/20 1444      PT LONG TERM GOAL #1   Title Patient will be independent with HEP to continue benefits of therapy  after discharge.     Baseline Dependent with form and technique for exercises; 10/26/2019: cueing needed to complete HEP; 12/06/2019:  performing exercises at home    Time 6    Period Weeks    Status Achieved      PT LONG TERM GOAL #2   Title Patient will improve 44mT to over 1 m/s to indicate increased safety with ambulating in a community environment.    Baseline .66 m/s; 10/26/2019: .75 m/s ; 12/06/2019: . 8 m/s; 01/16/20: .91 m/s; 02/15/20: 1.059m    Time 6    Period Weeks    Status Achieved      PT LONG TERM GOAL #3   Title Patient will improve FGA to 63 to indicate significant functional improvement of R hip function.    Baseline FGA: 44; 10/26/2019: FGA: 48; 5/192/2019: 53;    Time 6    Period Weeks    Status Deferred      PT LONG TERM GOAL #4   Title Patient will be able to balance for 10sec with SLS to improve static balance and ability to get dressed when in standing    Baseline 2.5 sec B; 10/26/2019: 2.5 sec; 12/06/2019: 3 sec; 01/16/20: 4 sec; 02/15/20 6sec: 04/18/20: 10    Time 6    Period Weeks    Status Achieved      PT LONG TERM GOAL #5   Title Patient will have a worst hip pain of a 3/10 over the past week to indicate functional improvement with transferring and driving    Baseline worst pain: 8/10; 10/26/2019: 6/10; 12/06/2019: 3/10, 01/16/20: 0/10    Time 6    Period Weeks    Status Achieved      PT LONG TERM GOAL #6   Title Patient will improve LEFS score by 12 points to show an improvement in functional movements and mobility.    Baseline 46; 02/15/20: 40: 04/18/20: 51    Time 6    Period Weeks    Status Partially Met                 Plan - 04/18/20 1505    Clinical Impression Statement D/C patient today with HEP at which the patient demonstrated and understood exercises.  Patient has made progress which was demonstrated by her improving her LEFS score by 11 from last reassessment and her SLS time by 4 seconds, showing an improvement in strength and her  ability to  perform functional activities.    Personal Factors and Comorbidities Age;Comorbidity 2    Comorbidities hip fx, RA    Examination-Activity Limitations Lift;Squat;Stand;Transfers    Stability/Clinical Decision Making Evolving/Moderate complexity    Clinical Decision Making Moderate    Rehab Potential Good    PT Frequency 2x / week    PT Duration 6 weeks    PT Treatment/Interventions Neuromuscular re-education;Stair training;Moist Heat;Cryotherapy;Iontophoresis 78m/ml Dexamethasone;Electrical Stimulation;DME Instruction;Therapeutic activities;Therapeutic exercise;Balance training;Manual techniques;Patient/family education;Passive range of motion;Dry needling;Joint Manipulations    PT Next Visit Plan DC at todays session    PT Home Exercise Plan See education section    Consulted and Agree with Plan of Care Patient           Patient will benefit from skilled therapeutic intervention in order to improve the following deficits and impairments:  Abnormal gait, Decreased activity tolerance, Decreased endurance, Decreased range of motion, Decreased strength, Decreased balance, Difficulty walking, Decreased cognition, Pain, Decreased coordination, Increased muscle spasms, Postural dysfunction, Hypomobility  Visit Diagnosis: Unsteadiness on feet  History of falling  Pain in right hip  Muscle weakness (generalized)  Acute bilateral low back pain without sciatica     Problem List Patient Active Problem List   Diagnosis Date Noted  . Chronic neck pain 04/15/2020  . Scalp laceration, subsequent encounter 04/05/2020  . Alcohol abuse 04/05/2020  . Chronic kidney disease, stage 3, mod decreased GFR 04/05/2020  . DOE (dyspnea on exertion) 02/09/2020  . Lethargy 02/09/2020  . Anxiety 01/23/2020  . Closed right hip fracture ([place] 09/20/2019  . Gout 05/18/2019  . Clavi 03/09/2019  . Right hip pain 07/08/2018  . Chronic bilateral low back pain without sciatica 12/31/2017  . Atrial  fibrillation ([place] 09/10/2017  . Trapezius strain, right, initial encounter 09/10/2017  . Depression, major, single episode, mild ([place] 09/10/2017  . Chronic venous insufficiency 09/09/2017  . Lymphedema 09/09/2017  . Pain of toe of left foot 08/17/2017  . Leg swelling 07/31/2017  . Dry eyes 07/06/2017  . Pain and swelling of lower extremity, right 03/29/2017  . Right shoulder pain 03/29/2017  . Diarrhea 05/18/2016  . Fall 05/01/2016  . Leg weakness, bilateral 12/30/2015  . Anemia 05/19/2013  . Psoriatic arthritis ([place] 02/03/2013  . Right hand pain 08/05/2012  . Psoriasis   . Osteoarthritis, multiple sites   . History of breast cancer 10/28/2011  . GERD (gastroesophageal reflux disease)   . Hypertension   . Osteopenia   . Carotid stenosis 05/27/2011   3:14 PM, 04/18/20 BMargarito Liner SPT Student Physical Therapist [facility] [phone] BMargarito Liner9/30/2021, 3:06 PM  [facility] [address] [place] [address]Phone: [phone]  Fax:  [phone] Name: Dana FETTERLYMRN: [id number] of Birth: 803/28/1942

## 2020-04-18 NOTE — Therapy (Signed)
[facility] [address], [place], [address] Phone: [phone]   Fax:  [phone]  Physical Therapy Treatment/Progress Note  Patient Details  Name: [name] MRN: [id number] Date of Birth: 26-Dec-1940 Referring Provider (PT): Tamala Julian   Encounter Date: 04/18/2020   PT End of Session - 04/18/20 1437    Visit Number 44    Number of Visits 46    Date for PT Re-Evaluation 03/28/20    Authorization Type 8/10    PT Start Time 0177    PT Stop Time 1515    PT Time Calculation (min) 39 min    Equipment Utilized During Treatment Gait belt    Activity Tolerance Patient tolerated treatment well;No increased pain    Behavior During Therapy WFL for tasks assessed/performed           Past Medical History:  Diagnosis Date  . (HFpEF) heart failure with preserved ejection fraction ([place])    a. 08/2017 Echo: EF 55-60%, no rwma, mild MR, mildly dil LA, nl RV fxn.  . Breast cancer ([place]) 2001   left breast  . Cancer ([place]) 2001   left breast ca  . Carotid arterial disease ([place])    a. 10/2004 s/p L CEA; b. 12/2015 Carotid U/S: RICA 1-39%; b. LICA patent CEA site.  Marland Kitchen GERD (gastroesophageal reflux disease)   . Hyperlipidemia   . Hypertension   . Osteoarthritis, multiple sites   . Osteopenia   . Persistent atrial fibrillation ([place])    a. Dx 08/2017; b. CHA2DS2VASc = 6-->Pradaxa; c. 09/2017 Successful DCCV (second shock - 200J); d. 10/2017 Recurrent Afib-->flecainide started 11/2017.  Marland Kitchen Personal history of radiation therapy 2001   left breast ca  . Psoriasis     Past Surgical History:  Procedure Laterality Date  . ABDOMINAL HYSTERECTOMY    . ANKLE FRACTURE SURGERY  4/08   left---hardware still in place  . 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 BREAST EXCISIONAL BIOPSY Left yrs ago   benign  . BREAST LUMPECTOMY Left 2001   f/u radiation  . CARDIOVERSION N/A 10/04/2017   Procedure: CARDIOVERSION;   Surgeon: Wellington Hampshire, MD;  Location: [facility] ORS;  Service: Cardiovascular;  Laterality: N/A;  . CARDIOVERSION N/A 12/16/2017   Procedure: CARDIOVERSION;  Surgeon: [name], MD;  Location: [facility] ORS;  Service: Cardiovascular;  Laterality: N/A;  . CAROTID ENDARTERECTOMY Left 10/23/2004  . FRACTURE SURGERY    . OOPHORECTOMY    . SHOULDER SURGERY  6/07   left  . TONSILLECTOMY AND ADENOIDECTOMY    . TOTAL HIP ARTHROPLASTY  2004   right    There were no vitals filed for this visit.   Subjective Assessment - 04/18/20 1436    Subjective Patient reports that she has neck pain that remains after 6 months but no other issues.    Pertinent History Patient previously seen for balance difficulties, shoulder pain and the right, and low back pain.  Patient reports no falls in the past six months. Patient reports pain has not improved for the past month and has been about the same since the onset of pain.    Limitations Sitting;Standing;Walking    Patient Stated Goals To improve balance when walking, driving pain    Currently in Pain? Yes    Pain Score 3     Pain Onset More than a month ago  PT Education - 04/18/20 1437    Education Details Form/technique with exercise    Person(s) Educated Patient    Methods Explanation;Demonstration    Comprehension Verbalized understanding;Returned demonstration            PT Short Term Goals - 10/26/19 1540      PT SHORT TERM GOAL #1   Title Patient will have a worst pain score of 6/10 to better be able to drive with less overall pain    Baseline 8/10; 10/26/2019: 6/10    Time 3    Period Weeks    Status On-going    Target Date 10/09/19             PT Long Term Goals - 04/18/20 1444      PT LONG TERM GOAL #1   Title Patient will be independent with HEP to continue benefits of therapy after discharge.     Baseline Dependent with form and technique for exercises; 10/26/2019:  cueing needed to complete HEP; 12/06/2019:  performing exercises at home    Time 6    Period Weeks    Status Achieved      PT LONG TERM GOAL #2   Title Patient will improve 31mT to over 1 m/s to indicate increased safety with ambulating in a community environment.    Baseline .66 m/s; 10/26/2019: .75 m/s ; 12/06/2019: . 8 m/s; 01/16/20: .91 m/s; 02/15/20: 1.076m    Time 6    Period Weeks    Status Achieved      PT LONG TERM GOAL #3   Title Patient will improve FGA to 63 to indicate significant functional improvement of R hip function.    Baseline FGA: 44; 10/26/2019: FGA: 48; 5/192/2019: 53;    Time 6    Period Weeks    Status Deferred      PT LONG TERM GOAL #4   Title Patient will be able to balance for 10sec with SLS to improve static balance and ability to get dressed when in standing    Baseline 2.5 sec B; 10/26/2019: 2.5 sec; 12/06/2019: 3 sec; 01/16/20: 4 sec; 02/15/20 6sec: 04/18/20: 10    Time 6    Period Weeks    Status Achieved      PT LONG TERM GOAL #5   Title Patient will have a worst hip pain of a 3/10 over the past week to indicate functional improvement with transferring and driving    Baseline worst pain: 8/10; 10/26/2019: 6/10; 12/06/2019: 3/10, 01/16/20: 0/10    Time 6    Period Weeks    Status Achieved      PT LONG TERM GOAL #6   Title Patient will improve LEFS score by 12 points to show an improvement in functional movements and mobility.    Baseline 46; 02/15/20: 40: 04/18/20: 51    Time 6    Period Weeks    Status Partially Met                 Plan - 04/18/20 1505    Clinical Impression Statement Patient has made progress which was demonstrated by her improving her LEFS score by 11 from last reassessment and her SLS time by 4 seconds, showing an improvement in strength and her ability to perform functional activities.  Patient was given HEP to perform and will follow back up in few weeks. Patient will benefit from skilled therapy to return to prior level of  function.    Personal Factors and Comorbidities  Age;Comorbidity 2    Comorbidities hip fx, RA    Examination-Activity Limitations Lift;Squat;Stand;Transfers    Stability/Clinical Decision Making Evolving/Moderate complexity    Clinical Decision Making Moderate    Rehab Potential Good    PT Frequency 2x / week    PT Duration 6 weeks    PT Treatment/Interventions Neuromuscular re-education;Stair training;Moist Heat;Cryotherapy;Iontophoresis 28m/ml Dexamethasone;Electrical Stimulation;DME Instruction;Therapeutic activities;Therapeutic exercise;Balance training;Manual techniques;Patient/family education;Passive range of motion;Dry needling;Joint Manipulations    PT Next Visit Plan Follow up on HEP and progress    PT Home Exercise Plan See education section    Consulted and Agree with Plan of Care Patient           Patient will benefit from skilled therapeutic intervention in order to improve the following deficits and impairments:  Abnormal gait, Decreased activity tolerance, Decreased endurance, Decreased range of motion, Decreased strength, Decreased balance, Difficulty walking, Decreased cognition, Pain, Decreased coordination, Increased muscle spasms, Postural dysfunction, Hypomobility  Visit Diagnosis: Unsteadiness on feet  History of falling  Pain in right hip  Muscle weakness (generalized)  Acute bilateral low back pain without sciatica     Problem List Patient Active Problem List   Diagnosis Date Noted  . Chronic neck pain 04/15/2020  . Scalp laceration, subsequent encounter 04/05/2020  . Alcohol abuse 04/05/2020  . Chronic kidney disease, stage 3, mod decreased GFR 04/05/2020  . DOE (dyspnea on exertion) 02/09/2020  . Lethargy 02/09/2020  . Anxiety 01/23/2020  . Closed right hip fracture ([place] 09/20/2019  . Gout 05/18/2019  . Clavi 03/09/2019  . Right hip pain 07/08/2018  . Chronic bilateral low back pain without sciatica 12/31/2017  . Atrial fibrillation ([place]  09/10/2017  . Trapezius strain, right, initial encounter 09/10/2017  . Depression, major, single episode, mild ([place] 09/10/2017  . Chronic venous insufficiency 09/09/2017  . Lymphedema 09/09/2017  . Pain of toe of left foot 08/17/2017  . Leg swelling 07/31/2017  . Dry eyes 07/06/2017  . Pain and swelling of lower extremity, right 03/29/2017  . Right shoulder pain 03/29/2017  . Diarrhea 05/18/2016  . Fall 05/01/2016  . Leg weakness, bilateral 12/30/2015  . Anemia 05/19/2013  . Psoriatic arthritis ([place] 02/03/2013  . Right hand pain 08/05/2012  . Psoriasis   . Osteoarthritis, multiple sites   . History of breast cancer 10/28/2011  . GERD (gastroesophageal reflux disease)   . Hypertension   . Osteopenia   . Carotid stenosis 05/27/2011   3:19 PM, 04/18/20 BMargarito Liner SPT Student Physical Therapist [facility] [phone] BMargarito Liner9/30/2021, 3:19 PM  [facility] [address] [place] [address]Phone: [phone]  Fax:  [phone] Name: Dana PACETTIMRN: [id number] of Birth: 8Oct 20, 1942

## 2020-04-22 ENCOUNTER — Other Ambulatory Visit: Payer: Self-pay | Admitting: Family Medicine

## 2020-04-22 DIAGNOSIS — D7589 Other specified diseases of blood and blood-forming organs: Secondary | ICD-10-CM

## 2020-04-22 DIAGNOSIS — R778 Other specified abnormalities of plasma proteins: Secondary | ICD-10-CM

## 2020-04-23 DIAGNOSIS — Z23 Encounter for immunization: Secondary | ICD-10-CM | POA: Diagnosis not present

## 2020-04-25 ENCOUNTER — Encounter: Payer: Self-pay | Admitting: Oncology

## 2020-04-25 ENCOUNTER — Other Ambulatory Visit: Payer: Self-pay

## 2020-04-25 ENCOUNTER — Inpatient Hospital Stay: Payer: Medicare Other

## 2020-04-25 ENCOUNTER — Inpatient Hospital Stay: Payer: Medicare Other | Attending: Oncology | Admitting: Oncology

## 2020-04-25 VITALS — BP 100/76 | HR 94 | Temp 96.7°F | Resp 20 | Wt 115.8 lb

## 2020-04-25 DIAGNOSIS — Z806 Family history of leukemia: Secondary | ICD-10-CM | POA: Diagnosis not present

## 2020-04-25 DIAGNOSIS — Z853 Personal history of malignant neoplasm of breast: Secondary | ICD-10-CM | POA: Diagnosis not present

## 2020-04-25 DIAGNOSIS — D539 Nutritional anemia, unspecified: Secondary | ICD-10-CM | POA: Insufficient documentation

## 2020-04-25 DIAGNOSIS — D472 Monoclonal gammopathy: Secondary | ICD-10-CM | POA: Insufficient documentation

## 2020-04-25 DIAGNOSIS — Z87891 Personal history of nicotine dependence: Secondary | ICD-10-CM | POA: Diagnosis not present

## 2020-04-25 LAB — VITAMIN B12: Vitamin B-12: 262 pg/mL (ref 180–914)

## 2020-04-25 LAB — CBC WITH DIFFERENTIAL/PLATELET
Abs Immature Granulocytes: 0.02 10*3/uL (ref 0.00–0.07)
Basophils Absolute: 0 10*3/uL (ref 0.0–0.1)
Basophils Relative: 1 %
Eosinophils Absolute: 0.1 10*3/uL (ref 0.0–0.5)
Eosinophils Relative: 2 %
HCT: 30.9 % — ABNORMAL LOW (ref 36.0–46.0)
Hemoglobin: 10.3 g/dL — ABNORMAL LOW (ref 12.0–15.0)
Immature Granulocytes: 0 %
Lymphocytes Relative: 30 %
Lymphs Abs: 1.6 10*3/uL (ref 0.7–4.0)
MCH: 38 pg — ABNORMAL HIGH (ref 26.0–34.0)
MCHC: 33.3 g/dL (ref 30.0–36.0)
MCV: 114 fL — ABNORMAL HIGH (ref 80.0–100.0)
Monocytes Absolute: 0.8 10*3/uL (ref 0.1–1.0)
Monocytes Relative: 15 %
Neutro Abs: 2.8 10*3/uL (ref 1.7–7.7)
Neutrophils Relative %: 52 %
Platelets: 245 10*3/uL (ref 150–400)
RBC: 2.71 MIL/uL — ABNORMAL LOW (ref 3.87–5.11)
RDW: 14.3 % (ref 11.5–15.5)
WBC: 5.3 10*3/uL (ref 4.0–10.5)
nRBC: 0 % (ref 0.0–0.2)

## 2020-04-25 LAB — FOLATE: Folate: 82 ng/mL (ref >5.9)

## 2020-04-25 NOTE — Progress Notes (Signed)
[facility]  [phone] [phone]  ID: [name] OB: May 05, 1941  MR#: [id number]  AOZ#:[id number]  Patient Care Team: [name], MD as PCP - General (Family Medicine) [name], MD as PCP - Cardiology (Cardiology) [name], MD (Family Medicine)  CHIEF COMPLAINT: MGUS, macrocytic anemia.  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  She currently feels well and is asymptomatic.  She has no neurologic complaints.  She denies any recent fevers or illnesses.  She has a good appetite and denies weight loss.  She has no chest pain, shortness of breath, cough, or hemoptysis.  She denies any nausea, vomiting, constipation, or diarrhea.  She has no urinary complaints.  Patient feels at her baseline and offers no specific complaints today.  REVIEW OF SYSTEMS:   Review of Systems  Constitutional: Negative.  Negative for fever, malaise/fatigue and weight loss.  Respiratory: Negative.  Negative for cough, hemoptysis and shortness of breath.   Cardiovascular: Negative.  Negative for chest pain and leg swelling.  Gastrointestinal: Negative.  Negative for abdominal pain, blood in stool and melena.  Genitourinary: Negative.  Negative for dysuria and hematuria.  Musculoskeletal: Negative.   Skin: Negative.  Negative for rash.  Neurological: Negative.  Negative for dizziness, focal weakness, weakness and headaches.  Psychiatric/Behavioral: Negative.  The patient is not nervous/anxious.     As per HPI. Otherwise, a complete review of systems is negative.  PAST MEDICAL HISTORY: Past Medical History:  Diagnosis Date  . (HFpEF) heart failure with preserved ejection fraction ([place])    a. 08/2017 Echo: EF 55-60%, no rwma, mild MR, mildly dil LA, nl RV fxn.  . Breast cancer ([place]) 2001   left breast  .  Cancer ([place]) 2001   left breast ca  . Carotid arterial disease ([place])    a. 10/2004 s/p L CEA; b. 12/2015 Carotid U/S: RICA 1-39%; b. LICA patent CEA site.  Marland Kitchen GERD (gastroesophageal reflux disease)   . Hyperlipidemia   . Hypertension   . Osteoarthritis, multiple sites   . Osteopenia   . Persistent atrial fibrillation ([place])    a. Dx 08/2017; b. CHA2DS2VASc = 6-->Pradaxa; c. 09/2017 Successful DCCV (second shock - 200J); d. 10/2017 Recurrent Afib-->flecainide started 11/2017.  Marland Kitchen Personal history of radiation therapy 2001   left breast ca  . Psoriasis     PAST SURGICAL HISTORY: Past Surgical History:  Procedure Laterality Date  . ABDOMINAL HYSTERECTOMY    . ANKLE FRACTURE SURGERY  4/08   left---hardware still in place  . 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 BREAST EXCISIONAL BIOPSY Left yrs ago   benign  . BREAST LUMPECTOMY Left 2001   f/u radiation  . CARDIOVERSION N/A 10/04/2017   Procedure: CARDIOVERSION;  Surgeon: Wellington Hampshire, MD;  Location: [facility] ORS;  Service: Cardiovascular;  Laterality: N/A;  . CARDIOVERSION N/A 12/16/2017   Procedure: CARDIOVERSION;  Surgeon: [name], MD;  Location: [facility] ORS;  Service: Cardiovascular;  Laterality: N/A;  . CAROTID ENDARTERECTOMY Left 10/23/2004  . FRACTURE SURGERY    . OOPHORECTOMY    . SHOULDER SURGERY  6/07   left  . TONSILLECTOMY AND ADENOIDECTOMY    . TOTAL HIP ARTHROPLASTY  2004   right    FAMILY HISTORY: Family History  Problem Relation Age of Onset  . Hypertension Brother   .  Pneumonia Mother   . Leukemia Father   . Heart disease Neg Hx   . Diabetes Neg Hx   . Breast cancer Neg Hx     ADVANCED DIRECTIVES (Y/N):  N  HEALTH MAINTENANCE: Social History   Tobacco Use  . Smoking status: Former Smoker    Packs/day: 1.00    Years: 40.00    Pack years: 40.00    Types: Cigarettes    Quit date: 07/21/1995    Years since quitting: 24.7  . Smokeless tobacco: Never Used  Vaping Use  . Vaping Use: Never used   Substance Use Topics  . Alcohol use: Yes    Alcohol/week: 16.0 standard drinks    Types: 2 Glasses of wine, 14 Standard drinks or equivalent per week    Comment: daily 1-2  . Drug use: No     Colonoscopy:  PAP:  Bone density:  Lipid panel:  Allergies  Allergen Reactions  . Adhesive [Tape] Other (See Comments)    "Does not work well with me"    Current Outpatient Medications  Medication Sig Dispense Refill  . allopurinol (ZYLOPRIM) 100 MG tablet Take 100 mg by mouth daily.    . Cholecalciferol (VITAMIN D-3 PO) Take 1 capsule by mouth 2 (two) times daily.     . Cholecalciferol (VITAMIN D-3) 25 MCG (1000 UT) CAPS Take 1,000 Units by mouth 2 (two) times daily.    . Cholecalciferol (VITAMIN D3) 1000 units CAPS Take 1 capsule by mouth 2 (two) times daily.     . flecainide (TAMBOCOR) 50 MG tablet TAKE 1 TABLET BY MOUTH TWICE A DAY 180 tablet 1  . flecainide (TAMBOCOR) 50 MG tablet Take 50 mg by mouth 2 (two) times daily.    . furosemide (LASIX) 40 MG tablet Take additional 1/2 tablet (20 mg total) in the afternoon. 90 tablet 1  . LORazepam (ATIVAN) 0.5 MG tablet TAKE 1 TABLET BY MOUTH TWICE A DAY AS NEEDED FOR ANXIETY 60 tablet 0  . losartan (COZAAR) 50 MG tablet Take 1 tablet (50 mg total) by mouth daily. 90 tablet 3  . metoprolol tartrate (LOPRESSOR) 50 MG tablet Take 1/2 tablet (25 mg) by mouth twice daily 90 tablet 1  . mirtazapine (REMERON) 15 MG tablet Take 15 mg by mouth at bedtime.    . Multiple Vitamin (MULTIVITAMIN) capsule Take 1 capsule by mouth daily.      Marland Kitchen PRADAXA 150 MG CAPS capsule TAKE 1 CAPSULE BY MOUTH TWICE A DAY 180 capsule 1   No current facility-administered medications for this visit.    OBJECTIVE: Vitals:   04/25/20 1122  BP: 100/76  Pulse: 94  Resp: 20  Temp: (!) 96.7 F (35.9 C)     Body mass index is 18.69 kg/m.    ECOG FS:0 - Asymptomatic  General: Well-developed, well-nourished, no acute distress. Eyes: Pink conjunctiva, anicteric  sclera. HEENT: Normocephalic, moist mucous membranes. Lungs: No audible wheezing or coughing. Heart: Regular rate and rhythm. Abdomen: Soft, nontender, no obvious distention. Musculoskeletal: No edema, cyanosis, or clubbing. Neuro: Alert, answering all questions appropriately. Cranial nerves grossly intact. Skin: No rashes or petechiae noted. Psych: Normal affect. Lymphatics: No cervical, calvicular, axillary or inguinal LAD.   LAB RESULTS:  Lab Results  Component Value Date   NA 140 04/09/2020   K 5.0 04/09/2020   CL 104 04/09/2020   CO2 26 04/09/2020   GLUCOSE 99 04/09/2020   BUN 28 (H) 04/09/2020   CREATININE 1.11 (H) 04/09/2020   CALCIUM 9.4  04/09/2020   PROT 6.4 04/15/2020   ALBUMIN 3.7 03/29/2020   AST 22 03/29/2020   ALT 11 03/29/2020   ALKPHOS 88 03/29/2020   BILITOT 0.4 03/29/2020   GFRNONAA 47 (L) 04/09/2020   GFRAA 54 (L) 04/09/2020    Lab Results  Component Value Date   WBC 5.3 04/25/2020   NEUTROABS 2.8 04/25/2020   HGB 10.3 (L) 04/25/2020   HCT 30.9 (L) 04/25/2020   MCV 114.0 (H) 04/25/2020   PLT 245 04/25/2020     STUDIES: DG Cervical Spine Complete  Result Date: 04/16/2020 CLINICAL DATA:  Chronic neck pain for several months EXAM: CERVICAL SPINE - COMPLETE 4+ VIEW COMPARISON:  None. FINDINGS: Seven cervical segments are well visualized. Mild loss of the normal cervical lordosis is noted. Multilevel disc space narrowing is noted from C3 to T1. Multilevel facet hypertrophic changes and osteophytes are noted as well. Very mild neural foraminal narrowing is noted. No acute fracture or acute facet abnormality is seen. No soft tissue changes are noted. IMPRESSION: Multilevel degenerative change without acute abnormality Electronically Signed   By: Inez Catalina M.D.   On: 04/16/2020 10:00   CT HEAD WO CONTRAST  Result Date: 03/29/2020 CLINICAL DATA:  Critical poly trauma EXAM: CT HEAD WITHOUT CONTRAST CT CERVICAL SPINE WITHOUT CONTRAST TECHNIQUE:  Multidetector CT imaging of the head and cervical spine was performed following the standard protocol without intravenous contrast. Multiplanar CT image reconstructions of the cervical spine were also generated. COMPARISON:  CT head 03/06/2012 FINDINGS: CT HEAD FINDINGS Brain: Diffuse cerebral atrophy. Ventricular dilatation consistent with central atrophy. Low-attenuation changes in the deep white matter consistent with small vessel ischemia. No abnormal extra-axial fluid collections. No mass effect or midline shift. Gray-white matter junctions are distinct. Basal cisterns are not effaced. No acute intracranial hemorrhage. Vascular: Intracranial arterial vascular calcifications are present. Skull: The calvarium appears intact. Subcutaneous scalp hematoma and laceration over the left posterior parietal region. Skin clips in the left scalp. Sinuses/Orbits: Paranasal sinuses and mastoid air cells are clear. Other: None. CT CERVICAL SPINE FINDINGS Alignment: Straightening of usual cervical lordosis without anterior subluxation. Normal alignment of the posterior elements. Changes likely represent positional artifact although muscle spasm could also have this appearance. C1-2 articulation appears intact. Skull base and vertebrae: Skull base appears intact. No vertebral compression deformities. No focal bone lesion or bone destruction. Bone cortex appears intact. Degenerative changes noted in the left temporomandibular joint. Soft tissues and spinal canal: No prevertebral soft tissue swelling. No abnormal paraspinal soft tissue mass or infiltration. Vascular calcifications. Disc levels: Degenerative changes throughout the cervical spine with narrowed interspaces and endplate hypertrophic change. Degenerative changes in the facet joints. Upper chest: Visualized lung apices are clear. Other: None. IMPRESSION: 1. No acute intracranial abnormalities. Chronic atrophy and small vessel ischemic changes. 2. Subcutaneous scalp  hematoma and laceration over the left posterior parietal region. 3. Nonspecific straightening of usual cervical lordosis. Degenerative changes throughout the cervical spine. No acute displaced fractures identified. Electronically Signed   By: Lucienne Capers M.D.   On: 03/29/2020 20:57   CT Cervical Spine Wo Contrast  Result Date: 03/29/2020 CLINICAL DATA:  Critical poly trauma EXAM: CT HEAD WITHOUT CONTRAST CT CERVICAL SPINE WITHOUT CONTRAST TECHNIQUE: Multidetector CT imaging of the head and cervical spine was performed following the standard protocol without intravenous contrast. Multiplanar CT image reconstructions of the cervical spine were also generated. COMPARISON:  CT head 03/06/2012 FINDINGS: CT HEAD FINDINGS Brain: Diffuse cerebral atrophy. Ventricular dilatation consistent with central atrophy.  Low-attenuation changes in the deep white matter consistent with small vessel ischemia. No abnormal extra-axial fluid collections. No mass effect or midline shift. Gray-white matter junctions are distinct. Basal cisterns are not effaced. No acute intracranial hemorrhage. Vascular: Intracranial arterial vascular calcifications are present. Skull: The calvarium appears intact. Subcutaneous scalp hematoma and laceration over the left posterior parietal region. Skin clips in the left scalp. Sinuses/Orbits: Paranasal sinuses and mastoid air cells are clear. Other: None. CT CERVICAL SPINE FINDINGS Alignment: Straightening of usual cervical lordosis without anterior subluxation. Normal alignment of the posterior elements. Changes likely represent positional artifact although muscle spasm could also have this appearance. C1-2 articulation appears intact. Skull base and vertebrae: Skull base appears intact. No vertebral compression deformities. No focal bone lesion or bone destruction. Bone cortex appears intact. Degenerative changes noted in the left temporomandibular joint. Soft tissues and spinal canal: No  prevertebral soft tissue swelling. No abnormal paraspinal soft tissue mass or infiltration. Vascular calcifications. Disc levels: Degenerative changes throughout the cervical spine with narrowed interspaces and endplate hypertrophic change. Degenerative changes in the facet joints. Upper chest: Visualized lung apices are clear. Other: None. IMPRESSION: 1. No acute intracranial abnormalities. Chronic atrophy and small vessel ischemic changes. 2. Subcutaneous scalp hematoma and laceration over the left posterior parietal region. 3. Nonspecific straightening of usual cervical lordosis. Degenerative changes throughout the cervical spine. No acute displaced fractures identified. Electronically Signed   By: Lucienne Capers M.D.   On: 03/29/2020 20:57   ECHOCARDIOGRAM COMPLETE  Result Date: 03/29/2020    ECHOCARDIOGRAM REPORT   Patient Name:   [name] Date of Exam: 03/29/2020 Medical Rec #:  [id number]          Height:       66.0 in Accession #:    [id number]         Weight:       116.0 lb Date of Birth:  Feb 15, 1941          BSA:          1.587 m Patient Age:    60 years           BP:           134/80 mmHg Patient Gender: F                  HR:           72 bpm. Exam Location:  [place] Procedure: 2D Echo, Cardiac Doppler and Color Doppler Indications:    R06.02 SOB  History:        Patient has prior history of Echocardiogram examinations, most                 recent 09/11/2017. Pulmonary HTN and Carotid Disease,                 Arrythmias:Atrial Fibrillation, Signs/Symptoms:Shortness of                 Breath; Risk Factors:Hypertension, Dyslipidemia and Former                 Smoker.  Sonographer:    Pilar Jarvis RDMS, RVT, RDCS Referring Phys: [id number] Remer  Sonographer Comments: Technically difficult study due to poor echo windows. IMPRESSIONS  1. Left ventricular ejection fraction, by estimation, is 60 to 65%. The left ventricle has normal function. The left ventricle has no regional wall  motion abnormalities. Left ventricular diastolic parameters are consistent with Grade II diastolic dysfunction (pseudonormalization).  2.  Right ventricular systolic function is normal. The right ventricular size is normal. There is mildly elevated pulmonary artery systolic pressure. The estimated right ventricular systolic pressure is 85.0 mmHg.  3. Left atrial size was moderately dilated.  4. Mild mitral valve regurgitation. FINDINGS  Left Ventricle: Left ventricular ejection fraction, by estimation, is 60 to 65%. The left ventricle has normal function. The left ventricle has no regional wall motion abnormalities. The left ventricular internal cavity size was normal in size. There is  no left ventricular hypertrophy. Left ventricular diastolic parameters are consistent with Grade II diastolic dysfunction (pseudonormalization). Right Ventricle: The right ventricular size is normal. No increase in right ventricular wall thickness. Right ventricular systolic function is normal. There is mildly elevated pulmonary artery systolic pressure. The tricuspid regurgitant velocity is 3.15  m/s, and with an assumed right atrial pressure of 5 mmHg, the estimated right ventricular systolic pressure is 27.7 mmHg. Left Atrium: Left atrial size was moderately dilated. Right Atrium: Right atrial size was normal in size. Pericardium: There is no evidence of pericardial effusion. Mitral Valve: The mitral valve is normal in structure. Mild mitral annular calcification. Mild mitral valve regurgitation. No evidence of mitral valve stenosis. Tricuspid Valve: The tricuspid valve is normal in structure. Tricuspid valve regurgitation is mild . No evidence of tricuspid stenosis. Aortic Valve: The aortic valve is normal in structure. Aortic valve regurgitation is not visualized. No aortic stenosis is present. Aortic valve mean gradient measures 3.0 mmHg. Aortic valve peak gradient measures 5.9 mmHg. Aortic valve area, by VTI measures 1.86 cm.  Pulmonic Valve: The pulmonic valve was normal in structure. Pulmonic valve regurgitation is not visualized. No evidence of pulmonic stenosis. Aorta: The aortic root is normal in size and structure. Venous: The inferior vena cava is normal in size with greater than 50% respiratory variability, suggesting right atrial pressure of 3 mmHg. IAS/Shunts: No atrial level shunt detected by color flow Doppler.  LEFT VENTRICLE PLAX 2D LVIDd:         4.70 cm     Diastology LVIDs:         3.10 cm     LV e' medial:    6.31 cm/s LV PW:         0.80 cm     LV E/e' medial:  14.1 LV IVS:        1.00 cm     LV e' lateral:   8.16 cm/s LVOT diam:     2.00 cm     LV E/e' lateral: 10.9 LV SV:         48 LV SV Index:   30 LVOT Area:     3.14 cm  LV Volumes (MOD) LV vol d, MOD A2C: 77.8 ml LV vol d, MOD A4C: 64.5 ml LV vol s, MOD A2C: 35.6 ml LV vol s, MOD A4C: 27.7 ml LV SV MOD A2C:     42.2 ml LV SV MOD A4C:     64.5 ml LV SV MOD BP:      38.7 ml RIGHT VENTRICLE             IVC RV Basal diam:  3.10 cm     IVC diam: 2.00 cm RV S prime:     12.50 cm/s TAPSE (M-mode): 2.4 cm LEFT ATRIUM             Index       RIGHT ATRIUM           Index LA diam:  4.00 cm 2.52 cm/m  RA Area:     15.10 cm LA Vol (A2C):   53.5 ml 33.72 ml/m RA Volume:   35.90 ml  22.63 ml/m LA Vol (A4C):   83.1 ml 52.38 ml/m LA Biplane Vol: 70.8 ml 44.63 ml/m  AORTIC VALVE AV Area (Vmax):    1.89 cm AV Area (Vmean):   1.71 cm AV Area (VTI):     1.86 cm AV Vmax:           121.50 cm/s AV Vmean:          78.100 cm/s AV VTI:            0.256 m AV Peak Grad:      5.9 mmHg AV Mean Grad:      3.0 mmHg LVOT Vmax:         73.10 cm/s LVOT Vmean:        42.400 cm/s LVOT VTI:          0.152 m LVOT/AV VTI ratio: 0.59  AORTA Ao Root diam: 3.00 cm Ao Asc diam:  3.20 cm Ao Arch diam: 2.4 cm MITRAL VALVE               TRICUSPID VALVE MV Area (PHT): 4.24 cm    TR Peak grad:   39.7 mmHg MV Decel Time: 179 msec    TR Vmax:        315.00 cm/s MV E velocity: 88.70 cm/s MV A  velocity: 68.60 cm/s  SHUNTS MV E/A ratio:  1.29        Systemic VTI:  0.15 m                            Systemic Diam: 2.00 cm Ida Rogue MD Electronically signed by Ida Rogue MD Signature Date/Time: 03/29/2020/6:31:32 PM    Final     ASSESSMENT: MGUS, macrocytic anemia.  PLAN:    1.  MGUS: Patient noted to have an M spike of 0.7 with an elevated kappa/lambda light chain ratio of 13.9.  Immunoglobulins are pending at time of dictation.  She has a chronic anemia, but no other evidence of endorgan damage with a normal platelet count, calcium, and creatinine.  No intervention is needed at this time.  Patient does not require a metastatic bone survey or a bone marrow biopsy.  We will repeat laboratory work in 3 months to assess for interval change.  Patient will follow up 1 week later to discuss the results. 2.  Macrocytic anemia: Chronic and unchanged.  Patient's hemoglobin is 10.3 today with an elevated MCV.  Have ordered B12, folate for completeness.  Patient has a history of chemotherapy for her psoriasis, not her breast cancer, therefore underlying MDS is also possible.  I have ordered [facility] Myeloid panel as well.  Follow-up as above. 3.  History of breast cancer: Patient underwent lumpectomy in 2001.  Her most recent mammogram on February 08, 2020 was reported as BI-RADS 2.  I spent a total of 45 minutes reviewing chart data, face-to-face evaluation with the patient, counseling and coordination of care as detailed above.   Patient expressed understanding and was in agreement with this plan. She also understands that She can call clinic at any time with any questions, concerns, or complaints.    Lloyd Huger, MD   04/25/2020 12:26 PM

## 2020-04-25 NOTE — Progress Notes (Signed)
Patient here today for new evaluation regarding abnormal labs.

## 2020-04-26 ENCOUNTER — Ambulatory Visit: Payer: PRIVATE HEALTH INSURANCE | Admitting: Family Medicine

## 2020-04-26 LAB — IGG, IGA, IGM
IgA: 152 mg/dL (ref 64–422)
IgG (Immunoglobin G), Serum: 323 mg/dL — ABNORMAL LOW (ref 586–1602)
IgM (Immunoglobulin M), Srm: 984 mg/dL — ABNORMAL HIGH (ref 26–217)

## 2020-04-26 LAB — KAPPA/LAMBDA LIGHT CHAINS
Kappa free light chain: 440.3 mg/L — ABNORMAL HIGH (ref 3.3–19.4)
Kappa, lambda light chain ratio: 15.4 — ABNORMAL HIGH (ref 0.26–1.65)
Lambda free light chains: 28.6 mg/L — ABNORMAL HIGH (ref 5.7–26.3)

## 2020-05-02 ENCOUNTER — Ambulatory Visit: Payer: Medicare Other | Attending: Family Medicine

## 2020-05-02 ENCOUNTER — Other Ambulatory Visit: Payer: Self-pay

## 2020-05-02 DIAGNOSIS — M2550 Pain in unspecified joint: Secondary | ICD-10-CM | POA: Diagnosis not present

## 2020-05-02 DIAGNOSIS — Z681 Body mass index (BMI) 19 or less, adult: Secondary | ICD-10-CM | POA: Diagnosis not present

## 2020-05-02 DIAGNOSIS — R2681 Unsteadiness on feet: Secondary | ICD-10-CM | POA: Insufficient documentation

## 2020-05-02 DIAGNOSIS — L409 Psoriasis, unspecified: Secondary | ICD-10-CM | POA: Diagnosis not present

## 2020-05-02 DIAGNOSIS — Z9181 History of falling: Secondary | ICD-10-CM | POA: Diagnosis not present

## 2020-05-02 DIAGNOSIS — M150 Primary generalized (osteo)arthritis: Secondary | ICD-10-CM | POA: Diagnosis not present

## 2020-05-02 DIAGNOSIS — M1A09X1 Idiopathic chronic gout, multiple sites, with tophus (tophi): Secondary | ICD-10-CM | POA: Diagnosis not present

## 2020-05-02 NOTE — Therapy (Signed)
[facility] [address] [place], [address] Phone: [phone]   Fax:  [phone]  Physical Therapy Treatment  Patient Details  Name: [name] MRN: [id number] Date of Birth: May 27, 1941 Referring Provider (PT): Tamala Julian   Encounter Date: 05/02/2020   PT End of Session - 05/02/20 1532    Visit Number 45    Number of Visits 46    Date for PT Re-Evaluation 03/28/20    Authorization Type 8/10    PT Start Time 8891    PT Stop Time 1530    PT Time Calculation (min) 45 min    Equipment Utilized During Treatment Gait belt    Activity Tolerance Patient tolerated treatment well;No increased pain    Behavior During Therapy WFL for tasks assessed/performed           Past Medical History:  Diagnosis Date  . (HFpEF) heart failure with preserved ejection fraction ([place])    a. 08/2017 Echo: EF 55-60%, no rwma, mild MR, mildly dil LA, nl RV fxn.  . Breast cancer ([place]) 2001   left breast  . Cancer ([place]) 2001   left breast ca  . Carotid arterial disease ([place])    a. 10/2004 s/p L CEA; b. 12/2015 Carotid U/S: RICA 1-39%; b. LICA patent CEA site.  Marland Kitchen GERD (gastroesophageal reflux disease)   . Hyperlipidemia   . Hypertension   . Osteoarthritis, multiple sites   . Osteopenia   . Persistent atrial fibrillation ([place])    a. Dx 08/2017; b. CHA2DS2VASc = 6-->Pradaxa; c. 09/2017 Successful DCCV (second shock - 200J); d. 10/2017 Recurrent Afib-->flecainide started 11/2017.  Marland Kitchen Personal history of radiation therapy 2001   left breast ca  . Psoriasis     Past Surgical History:  Procedure Laterality Date  . ABDOMINAL HYSTERECTOMY    . ANKLE FRACTURE SURGERY  4/08   left---hardware still in place  . 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 BREAST EXCISIONAL BIOPSY Left yrs ago   benign  . BREAST LUMPECTOMY Left 2001   f/u radiation  . CARDIOVERSION N/A 10/04/2017   Procedure: CARDIOVERSION;  Surgeon: Wellington Hampshire, MD;  Location: [facility] ORS;  Service: Cardiovascular;  Laterality: N/A;  . CARDIOVERSION N/A 12/16/2017   Procedure: CARDIOVERSION;  Surgeon: [name], MD;  Location: [facility] ORS;  Service: Cardiovascular;  Laterality: N/A;  . CAROTID ENDARTERECTOMY Left 10/23/2004  . FRACTURE SURGERY    . OOPHORECTOMY    . SHOULDER SURGERY  6/07   left  . TONSILLECTOMY AND ADENOIDECTOMY    . TOTAL HIP ARTHROPLASTY  2004   right    There were no vitals filed for this visit.   Subjective Assessment - 05/02/20 1502    Subjective Patient states no major changes since the previous session. States she would like to address taking steps without UE support.    Pertinent History Patient previously seen for balance difficulties, shoulder pain and the right, and low back pain.  Patient reports no falls in the past six months. Patient reports pain has not improved for the past month and has been about the same since the onset of pain.    Limitations Sitting;Standing;Walking    Patient Stated Goals To improve balance when walking, driving pain    Currently in Pain? No/denies    Pain Onset More than a month ago             TREATMENT Therapeutic Exercise Cervical  extension over the towel - x 15  SNAGS rotations with a towel - x 15  Cervical extension against towel - x 10  Shoulder extension against YTB - x 10  Scapular retraction in standing - x 10  Hip swings laterally/forward/backward - x 10  Hip extension in standing with UE support - x 10  Hip abduction in standing with UE support - x 10  Performed exercises to decrease neck pain and improve strength     PT Education - 05/02/20 1504    Education Details form/technique with exercise    Person(s) Educated Patient    Methods Explanation;Demonstration    Comprehension Verbalized understanding;Returned demonstration            PT Short Term Goals - 10/26/19 1540      PT SHORT TERM GOAL #1   Title Patient will have a worst pain score  of 6/10 to better be able to drive with less overall pain    Baseline 8/10; 10/26/2019: 6/10    Time 3    Period Weeks    Status On-going    Target Date 10/09/19             PT Long Term Goals - 04/18/20 1444      PT LONG TERM GOAL #1   Title Patient will be independent with HEP to continue benefits of therapy after discharge.     Baseline Dependent with form and technique for exercises; 10/26/2019: cueing needed to complete HEP; 12/06/2019:  performing exercises at home    Time 6    Period Weeks    Status Achieved      PT LONG TERM GOAL #2   Title Patient will improve 73mT to over 1 m/s to indicate increased safety with ambulating in a community environment.    Baseline .66 m/s; 10/26/2019: .75 m/s ; 12/06/2019: . 8 m/s; 01/16/20: .91 m/s; 02/15/20: 1.077m    Time 6    Period Weeks    Status Achieved      PT LONG TERM GOAL #3   Title Patient will improve FGA to 63 to indicate significant functional improvement of R hip function.    Baseline FGA: 44; 10/26/2019: FGA: 48; 5/192/2019: 53;    Time 6    Period Weeks    Status Deferred      PT LONG TERM GOAL #4   Title Patient will be able to balance for 10sec with SLS to improve static balance and ability to get dressed when in standing    Baseline 2.5 sec B; 10/26/2019: 2.5 sec; 12/06/2019: 3 sec; 01/16/20: 4 sec; 02/15/20 6sec: 04/18/20: 10    Time 6    Period Weeks    Status Achieved      PT LONG TERM GOAL #5   Title Patient will have a worst hip pain of a 3/10 over the past week to indicate functional improvement with transferring and driving    Baseline worst pain: 8/10; 10/26/2019: 6/10; 12/06/2019: 3/10, 01/16/20: 0/10    Time 6    Period Weeks    Status Achieved      PT LONG TERM GOAL #6   Title Patient will improve LEFS score by 12 points to show an improvement in functional movements and mobility.    Baseline 46; 02/15/20: 40: 04/18/20: 51    Time 6    Period Weeks    Status Partially Met  Plan -  05/02/20 1539    Clinical Impression Statement Patient demonstrates good technique with hip and cervical based motions. Requires cueing for proper head on neck posture, indciating decreased coordination along the affected musculature. Patient is making improvements and will benefit from further skilled therapy to return to prior level of function.    Personal Factors and Comorbidities Age;Comorbidity 2    Comorbidities hip fx, RA    Examination-Activity Limitations Lift;Squat;Stand;Transfers    Stability/Clinical Decision Making Evolving/Moderate complexity    Rehab Potential Good    PT Frequency 2x / week    PT Duration 6 weeks    PT Treatment/Interventions Neuromuscular re-education;Stair training;Moist Heat;Cryotherapy;Iontophoresis 84m/ml Dexamethasone;Electrical Stimulation;DME Instruction;Therapeutic activities;Therapeutic exercise;Balance training;Manual techniques;Patient/family education;Passive range of motion;Dry needling;Joint Manipulations    PT Next Visit Plan Follow up on HEP and progress    PT Home Exercise Plan See education section    Consulted and Agree with Plan of Care Patient           Patient will benefit from skilled therapeutic intervention in order to improve the following deficits and impairments:  Abnormal gait, Decreased activity tolerance, Decreased endurance, Decreased range of motion, Decreased strength, Decreased balance, Difficulty walking, Decreased cognition, Pain, Decreased coordination, Increased muscle spasms, Postural dysfunction, Hypomobility  Visit Diagnosis: Unsteadiness on feet  History of falling     Problem List Patient Active Problem List   Diagnosis Date Noted  . Chronic neck pain 04/15/2020  . Scalp laceration, subsequent encounter 04/05/2020  . Alcohol abuse 04/05/2020  . Chronic kidney disease, stage 3, mod decreased GFR (HCC) 04/05/2020  . DOE (dyspnea on exertion) 02/09/2020  . Lethargy 02/09/2020  . Anxiety 01/23/2020  .  Closed right hip fracture ([place] 09/20/2019  . Gout 05/18/2019  . [place] 03/09/2019  . Right hip pain 07/08/2018  . Chronic bilateral low back pain without sciatica 12/31/2017  . Atrial fibrillation ([facility] 09/10/2017  . Trapezius strain, right, initial encounter 09/10/2017  . Depression, major, single episode, mild ([place] 09/10/2017  . Chronic venous insufficiency 09/09/2017  . Lymphedema 09/09/2017  . Pain of toe of left foot 08/17/2017  . Leg swelling 07/31/2017  . Dry eyes 07/06/2017  . Pain and swelling of lower extremity, right 03/29/2017  . Right shoulder pain 03/29/2017  . Diarrhea 05/18/2016  . Fall 05/01/2016  . Leg weakness, bilateral 12/30/2015  . Anemia 05/19/2013  . Psoriatic arthritis ([facility] 02/03/2013  . Right hand pain 08/05/2012  . Psoriasis   . Osteoarthritis, multiple sites   . History of breast cancer 10/28/2011  . GERD (gastroesophageal reflux disease)   . Hypertension   . Osteopenia   . Carotid stenosis 05/27/2011    WBlythe Stanford PT DPT 05/02/2020, 4:54 PM  [facility] [address] [place] [address]Phone: [phone]  Fax:  [phone] Name: CRAILEIGH SABATERMRN: [id number] of Birth: 806-01-1941

## 2020-05-06 ENCOUNTER — Encounter: Payer: Self-pay | Admitting: Podiatry

## 2020-05-06 ENCOUNTER — Ambulatory Visit (INDEPENDENT_AMBULATORY_CARE_PROVIDER_SITE_OTHER): Payer: Medicare Other | Admitting: Podiatry

## 2020-05-06 ENCOUNTER — Other Ambulatory Visit: Payer: Self-pay

## 2020-05-06 DIAGNOSIS — M79676 Pain in unspecified toe(s): Secondary | ICD-10-CM

## 2020-05-06 DIAGNOSIS — B351 Tinea unguium: Secondary | ICD-10-CM | POA: Diagnosis not present

## 2020-05-06 DIAGNOSIS — I872 Venous insufficiency (chronic) (peripheral): Secondary | ICD-10-CM

## 2020-05-06 NOTE — Progress Notes (Signed)
This patient returns to my office for at risk foot care.  This patient requires this care by a professional since this patient will be at risk due to having chronic kidney disease and lymphedema.  This patient is unable to cut nails herself since the patient cannot reach her nails.These nails are painful walking and wearing shoes.  This patient presents for at risk foot care today.  General Appearance  Alert, conversant and in no acute stress.  Vascular  Dorsalis pedis and posterior tibial  pulses are palpable  bilaterally.  Capillary return is within normal limits  bilaterally. Temperature is within normal limits  bilaterally.  Neurologic  Senn-Weinstein monofilament wire test within normal limits  bilaterally. Muscle power within normal limits bilaterally.  Nails Thick disfigured discolored nails with subungual debris  from hallux to fifth toes bilaterally. No evidence of bacterial infection or drainage bilaterally.  Orthopedic  No limitations of motion  feet .  No crepitus or effusions noted.  No bony pathology or digital deformities noted.  HAV  B/L.  Mallet toe third toe left foot.  Arthritic history of gout.  Skin  normotropic skin with no porokeratosis noted bilaterally.  No signs of infections or ulcers noted.     Onychomycosis  Pain in right toes  Pain in left toes  Consent was obtained for treatment procedures.   Mechanical debridement of nails 1-5  bilaterally performed with a nail nipper.  Filed with dremel without incident.  Gave her information for purchasing crest pad left foot.   Return office visit    3 months                  Told patient to return for periodic foot care and evaluation due to potential at risk complications.   Gardiner Barefoot DPM

## 2020-05-10 DIAGNOSIS — H353132 Nonexudative age-related macular degeneration, bilateral, intermediate dry stage: Secondary | ICD-10-CM | POA: Diagnosis not present

## 2020-05-10 DIAGNOSIS — Z961 Presence of intraocular lens: Secondary | ICD-10-CM | POA: Diagnosis not present

## 2020-05-13 ENCOUNTER — Ambulatory Visit: Payer: Medicare Other | Admitting: Family Medicine

## 2020-05-22 ENCOUNTER — Ambulatory Visit: Payer: Medicare Other | Admitting: Family Medicine

## 2020-05-27 ENCOUNTER — Other Ambulatory Visit: Payer: Self-pay

## 2020-05-27 ENCOUNTER — Ambulatory Visit (INDEPENDENT_AMBULATORY_CARE_PROVIDER_SITE_OTHER): Payer: Medicare Other | Admitting: Family Medicine

## 2020-05-27 ENCOUNTER — Encounter: Payer: Self-pay | Admitting: Family Medicine

## 2020-05-27 DIAGNOSIS — F320 Major depressive disorder, single episode, mild: Secondary | ICD-10-CM | POA: Diagnosis not present

## 2020-05-27 DIAGNOSIS — D472 Monoclonal gammopathy: Secondary | ICD-10-CM | POA: Diagnosis not present

## 2020-05-27 DIAGNOSIS — I6523 Occlusion and stenosis of bilateral carotid arteries: Secondary | ICD-10-CM | POA: Diagnosis not present

## 2020-05-27 DIAGNOSIS — W19XXXD Unspecified fall, subsequent encounter: Secondary | ICD-10-CM | POA: Diagnosis not present

## 2020-05-27 DIAGNOSIS — F419 Anxiety disorder, unspecified: Secondary | ICD-10-CM

## 2020-05-27 DIAGNOSIS — I4891 Unspecified atrial fibrillation: Secondary | ICD-10-CM

## 2020-05-27 NOTE — Assessment & Plan Note (Signed)
Patient with no recurrent issues of falls.  I encouraged her to use her rolling walker as much as possible.  I encouraged her to stay active and continue the exercises that physical therapy is given her.

## 2020-05-27 NOTE — Assessment & Plan Note (Signed)
She will continue to see oncology. 

## 2020-05-27 NOTE — Assessment & Plan Note (Signed)
Stable.  She can taper down on the Remeron to 7.5 mg and see how she does.  She can continue the as needed Ativan.  Advised not to drink any alcohol when taking the Ativan.

## 2020-05-27 NOTE — Assessment & Plan Note (Signed)
Patient is in A. fib today.  She is asymptomatic.  Discussed monitoring for chest pain, shortness of breath, palpitations, or fatigue.  If those occur she'll be evaluated immediately in the emergency department or contact her specialist right away.  She'll continue medication management through cardiology for this.  I did offer referral to our clinical pharmacist to see if she can help with the cost of the Pradaxa though the patient declined this.  Discussed need for Pradaxa to help reduce stroke risk.

## 2020-05-27 NOTE — Patient Instructions (Signed)
Nice to see you. Please decrease your mirtazapine dose to 7.5 mg once daily.  Please contact me in about 3 weeks to let me know how you been doing on this dose.  If you have any worsening anxiety or depression please increase the dose of the mirtazapine back to 15 mg once daily. If you develop chest pain, shortness of breath, palpitations that do not stop, or fatigue please seek medical attention immediately.  This could be related to uncontrolled A. fib.

## 2020-05-27 NOTE — Progress Notes (Signed)
[name], MD Phone: [phone]  [name] is a 79 y.o. female who presents today for f/u.  MGUS: Patient has seen oncology.  She will be monitored periodically by them for any lab changes.  She denies night sweats and bone pain.  Falls: Patient notes no recent falls.  She discontinued physical therapy a couple of weeks ago.  Notes it did help with her balance.  She occasionally uses a cane or a walker though notes she feels as though those make her little more unsteady.   The 4 wheel rolling walker seems to do best for her.  Anxiety: Patient notes this is pretty stable.  Notes very rarely she'll feel panicky at night and can't sleep.  Should take an Ativan when that occurs.  She only takes this once a week at most.  Also on Remeron though she wonders if she can discontinue the Remeron.  A. fib: Patient notes no palpitations, chest pain, or shortness of breath.  She is on flecainide and Pradaxa.  Takes metoprolol as well.  Social History   Tobacco Use  Smoking Status Former Smoker  . Packs/day: 1.00  . Years: 40.00  . Pack years: 40.00  . Types: Cigarettes  . Quit date: 07/21/1995  . Years since quitting: 24.8  Smokeless Tobacco Never Used     ROS see history of present illness  Objective  Physical Exam Vitals:   05/27/20 0904  BP: 125/80  Pulse: (!) 101  Temp: 97.6 F (36.4 C)  SpO2: 99%    BP Readings from Last 3 Encounters:  05/27/20 125/80  04/25/20 100/76  04/15/20 120/70   Wt Readings from Last 3 Encounters:  05/27/20 117 lb 9.6 oz (53.3 kg)  04/25/20 115 lb 12.8 oz (52.5 kg)  04/15/20 117 lb 6.4 oz (53.3 kg)    Physical Exam Constitutional:      General: She is not in acute distress.    Appearance: She is not diaphoretic.  Cardiovascular:     Rate and Rhythm: Normal rate. Rhythm irregularly irregular.     Heart sounds: Normal heart sounds.  Pulmonary:     Effort: Pulmonary effort is normal.     Breath sounds: Normal breath sounds.    Musculoskeletal:     Right lower leg: No edema.     Left lower leg: No edema.  Skin:    General: Skin is warm and dry.  Neurological:     Mental Status: She is alert.      Assessment/Plan: Please see individual problem list.  Problem List Items Addressed This Visit    Anxiety    Stable.  She can taper down on the Remeron to 7.5 mg and see how she does.  She can continue the as needed Ativan.  Advised not to drink any alcohol when taking the Ativan.      Atrial fibrillation ([place])    Patient is in A. fib today.  She is asymptomatic.  Discussed monitoring for chest pain, shortness of breath, palpitations, or fatigue.  If those occur she'll be evaluated immediately in the emergency department or contact her specialist right away.  She'll continue medication management through cardiology for this.  I did offer referral to our clinical pharmacist to see if she can help with the cost of the Pradaxa though the patient declined this.  Discussed need for Pradaxa to help reduce stroke risk.      Depression, major, single episode, mild ([place])    Denies depression symptoms.  We'll  see how she does tapering down on the Remeron.      Fall    Patient with no recurrent issues of falls.  I encouraged her to use her rolling walker as much as possible.  I encouraged her to stay active and continue the exercises that physical therapy is given her.      MGUS (monoclonal gammopathy of unknown significance)    She will continue to see oncology.          This visit occurred during the SARS-CoV-2 public health emergency.  Safety protocols were in place, including screening questions prior to the visit, additional usage of staff PPE, and extensive cleaning of exam room while observing appropriate contact time as indicated for disinfecting solutions.    [name], MD [place]

## 2020-05-27 NOTE — Assessment & Plan Note (Signed)
Denies depression symptoms.  We'll see how she does tapering down on the Remeron.

## 2020-06-18 ENCOUNTER — Ambulatory Visit: Payer: Medicare Other | Admitting: Internal Medicine

## 2020-06-20 ENCOUNTER — Other Ambulatory Visit
Admission: RE | Admit: 2020-06-20 | Discharge: 2020-06-20 | Disposition: A | Discharge status: Home / Self Care | Payer: Medicare Other | Source: Ambulatory Visit | Attending: Internal Medicine | Admitting: Internal Medicine

## 2020-06-20 ENCOUNTER — Ambulatory Visit (INDEPENDENT_AMBULATORY_CARE_PROVIDER_SITE_OTHER): Payer: Medicare Other | Admitting: Internal Medicine

## 2020-06-20 ENCOUNTER — Encounter: Payer: Self-pay | Admitting: Internal Medicine

## 2020-06-20 ENCOUNTER — Other Ambulatory Visit: Payer: Self-pay

## 2020-06-20 VITALS — BP 161/103 | HR 121 | Ht 66.0 in | Wt 116.2 lb

## 2020-06-20 DIAGNOSIS — Z7901 Long term (current) use of anticoagulants: Secondary | ICD-10-CM | POA: Diagnosis not present

## 2020-06-20 DIAGNOSIS — I10 Essential (primary) hypertension: Secondary | ICD-10-CM | POA: Diagnosis not present

## 2020-06-20 DIAGNOSIS — Z01812 Encounter for preprocedural laboratory examination: Secondary | ICD-10-CM | POA: Diagnosis not present

## 2020-06-20 DIAGNOSIS — I4819 Other persistent atrial fibrillation: Secondary | ICD-10-CM

## 2020-06-20 DIAGNOSIS — Z20822 Contact with and (suspected) exposure to covid-19: Secondary | ICD-10-CM | POA: Diagnosis not present

## 2020-06-20 DIAGNOSIS — I6523 Occlusion and stenosis of bilateral carotid arteries: Secondary | ICD-10-CM | POA: Diagnosis not present

## 2020-06-20 DIAGNOSIS — I739 Peripheral vascular disease, unspecified: Secondary | ICD-10-CM | POA: Diagnosis not present

## 2020-06-20 DIAGNOSIS — Z888 Allergy status to other drugs, medicaments and biological substances status: Secondary | ICD-10-CM | POA: Diagnosis not present

## 2020-06-20 DIAGNOSIS — Z79899 Other long term (current) drug therapy: Secondary | ICD-10-CM | POA: Diagnosis not present

## 2020-06-20 DIAGNOSIS — L4050 Arthropathic psoriasis, unspecified: Secondary | ICD-10-CM | POA: Diagnosis not present

## 2020-06-20 DIAGNOSIS — I5032 Chronic diastolic (congestive) heart failure: Secondary | ICD-10-CM | POA: Diagnosis not present

## 2020-06-20 LAB — SARS CORONAVIRUS 2 (TAT 6-24 HRS): SARS Coronavirus 2: NEGATIVE

## 2020-06-20 NOTE — Progress Notes (Signed)
Patient Care Team: [name], MD as PCP - General (Family Medicine) [name], MD as PCP - Cardiology (Cardiology) [name], MD (Family Medicine)   HPI  [name] is a 79 y.o. female Seen in follow-up for recent hospitalization having presented with acute shortness of breath and found to be in rapid atrial fibrillation.  Rate control was accomplished with beta-blockers and calcium blockers and anticoagulation with Xarelto; now on BB alone  She has had recurrent atrial fib with RVR  UnderwentDCCV with rapid reversion  Started flecainide 5/19  No interval Afib of which she is aware; however noted by PCP early November to be in atrial fibrillation.  Has had some increasing dyspnea.  Not clearly related to A. Fib.  No chest pain or edema  Hand arthritis seen by Dr. Abner Greenspan.  Psoriatic arthritis.  No specific therapy.    Seen in ER following a fall while Inebriated ( ER BAL >200)  Discussed with DR TT--drinking at least 2 ounces of vodka at night and some wine.  There have been concerns from the children about drinking too much.  Feels doted upon by her grandchildren and appreciated by her great-grandchildren  Her son has reached back out to her.  Her episodically returning prodigal.   noted to have chronic anemia    DATE PR interval QRSduration Dose-Flec  5/19  NA 78 0  6/19 210 84 50  9/19 210 90 50  10/20 216 92 50  5/21 226 94 50       \]     DATE TEST EF   2/19 Echo   65 % LAE (48/2.8/46)  9/21 Echo  60-65%     Date Cr K Hgb  2/19 0.8 3.9 12.5  5/19  1.12 3.8   6/20 0.97 4.4 12.4  12/20 0.9  (CE) 2/21 4.2  10.6  9/21 1.11<<1.7 5.0 16.1<<0.9    Thromboembolic risk factors ( age  -2, HTN-1, Vasc disease -1, Gender-1) for a CHADSVASc Score of 5       She is [name]' s wife.     Past Surgical History:  Procedure Laterality Date  . ABDOMINAL HYSTERECTOMY    . ANKLE FRACTURE SURGERY  4/08   left---hardware still in  place  . 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 BREAST EXCISIONAL BIOPSY Left yrs ago   benign  . BREAST LUMPECTOMY Left 2001   f/u radiation  . CARDIOVERSION N/A 10/04/2017   Procedure: CARDIOVERSION;  Surgeon: Wellington Hampshire, MD;  Location: [facility] ORS;  Service: Cardiovascular;  Laterality: N/A;  . CARDIOVERSION N/A 12/16/2017   Procedure: CARDIOVERSION;  Surgeon: [name], MD;  Location: [facility] ORS;  Service: Cardiovascular;  Laterality: N/A;  . CAROTID ENDARTERECTOMY Left 10/23/2004  . FRACTURE SURGERY    . OOPHORECTOMY    . SHOULDER SURGERY  6/07   left  . TONSILLECTOMY AND ADENOIDECTOMY    . TOTAL HIP ARTHROPLASTY  2004   right    Current Outpatient Medications  Medication Sig Dispense Refill  . allopurinol (ZYLOPRIM) 100 MG tablet Take 100 mg by mouth daily.    . Cholecalciferol (VITAMIN D3) 1000 units CAPS Take 1 capsule by mouth 2 (two) times daily.     . flecainide (TAMBOCOR) 50 MG tablet Take 50 mg by mouth 2 (two) times daily.    . furosemide (LASIX) 40 MG tablet Take additional 1/2 tablet (20 mg total) in the afternoon.  90 tablet 1  . LORazepam (ATIVAN) 0.5 MG tablet TAKE 1 TABLET BY MOUTH TWICE A DAY AS NEEDED FOR ANXIETY 60 tablet 0  . losartan (COZAAR) 50 MG tablet Take 1 tablet (50 mg total) by mouth daily. 90 tablet 3  . metoprolol tartrate (LOPRESSOR) 50 MG tablet Take 1/2 tablet (25 mg) by mouth twice daily 90 tablet 1  . mirtazapine (REMERON) 15 MG tablet Take 15 mg by mouth at bedtime.    . Multiple Vitamin (MULTIVITAMIN) capsule Take 1 capsule by mouth daily.      Marland Kitchen PRADAXA 150 MG CAPS capsule TAKE 1 CAPSULE BY MOUTH TWICE A DAY 180 capsule 1   No current facility-administered medications for this visit.    Allergies  Allergen Reactions  . Adhesive [Tape] Other (See Comments)    "Does not work well with me"      Review of Systems negative except from HPI and PMH  Physical Exam BP (!) 161/103 (BP Location: Right Arm, Patient Position: Sitting,  Cuff Size: Normal)   Pulse (!) 121   Ht 5\' 6"  (1.676 m)   Wt 116 lb 4 oz (52.7 kg)   BMI 18.76 kg/m  Well developed and nourished in no acute distress HENT normal Neck supple with JVP-  Flat  Clear Rapid and irregular rate and rhythm, no murmurs or gallops Abd-soft with active BS No Clubbing cyanosis edema Skin-warm and dry A & Oriented  Grossly normal sensory and motor function  ECG atrial fibrillation at 121 Interval-/10/35   Assessment and  Plan  Atrial fibrillation-persistent with a rapid rate  Sinus brady  Grief   Alcohol use/ (As above)   Anemia   Hypertension   Arthritis-psoriatic  Peripheral vascular disease carotid artery occlusion  No interval palpitations.  Unfortunately back in atrial fibrillation with a rapid rate.  We will undertake cardioversion.  With history of sinus bradycardia, will not augment rate control at this time.    Tolerating flecainide--may be appropriate to increase dose.  Discussed the interplay between alcohol and atrial fibrillation.  Blood pressure is also elevated.  I have asked her to check her blood pressures regularly at home and will need to review these data.  We will have her see an APP following cardioversion and then I will see her again in 3 months.  Suspect augmented therapy will be necessary   Discussed alcohol, her alcohol levels at the time of her fall, her habits particularly in light of living alone, thinking in terms of the rest of her family  With her persistent atrial fibrillation we will undertake cardioversion  Her son recently reached out.

## 2020-06-20 NOTE — Patient Instructions (Addendum)
Medication Instructions:  - Your physician recommends that you continue on your current medications as directed. Please refer to the Current Medication list given to you today.  *If you need a refill on your cardiac medications before your next appointment, please call your pharmacy*   Lab Work: - Your physician recommends that you have lab work today: BMP/ CBC  - Pre procedure COVID test: today - once you leave the office, you will need to drive around to the [place]  - staff will come out to the car to swab you  If you have labs (blood work) drawn today and your tests are completely normal, you will receive your results only by: Marland Kitchen MyChart Message (if you have MyChart) OR . A paper copy in the mail If you have any lab test that is abnormal or we need to change your treatment, we will call you to review the results.   Testing/Procedures: - Your physician has recommended that you have a Cardioversion (DCCV). Electrical Cardioversion uses a jolt of electricity to your heart either through paddles or wired patches attached to your chest. This is a controlled, usually prescheduled, procedure. Defibrillation is done under light anesthesia in the hospital, and you usually go home the day of the procedure. This is done to get your heart back into a normal rhythm. You are not awake for the procedure.   You are scheduled for a Cardioversion on Monday 06/24/20 with Dr. Fletcher Anon.  Please arrive at the [facility] at 6:30 a.m. on the day of your procedure.  DIET INSTRUCTIONS:  Nothing to eat or drink after midnight         1) Labs: As above  2) Medications:  You may take all of your regular medications with enough water to get them down safely unless listed below:  - HOLD lasix (furosemide) the morning of your procedure  3) Must have a responsible person to drive you home.  4) Bring a current list of your medications and current insurance cards.    If you have any  questions after you get home, please call the office at [phone]    Follow-Up: At [facility], you and your health needs are our priority.  As part of our continuing mission to provide you with exceptional heart care, we have created designated Provider Care Teams.  These Care Teams include your primary Cardiologist (physician) and Advanced Practice Providers (APPs -  Physician Assistants and Nurse Practitioners) who all work together to provide you with the care you need, when you need it.  We recommend signing up for the patient portal called "MyChart".  Sign up information is provided on this After Visit Summary.  MyChart is used to connect with patients for Virtual Visits (Telemedicine).  Patients are able to view lab/test results, encounter notes, upcoming appointments, etc.  Non-urgent messages can be sent to your provider as well.   To learn more about what you can do with MyChart, go to [email or web address].    Your next appointment:   1) 2 weeks- APP   2) 3 months- Dr. Caryl Comes  The format for your next appointment:   In Person  Provider:   As above  Other Instructions   Electrical Cardioversion Electrical cardioversion is the delivery of a jolt of electricity to restore a normal rhythm to the heart. A rhythm that is too fast or is not regular keeps the heart from pumping well. In this procedure, sticky patches or metal paddles  are placed on the chest to deliver electricity to the heart from a device. This procedure may be done in an emergency if:  There is low or no blood pressure as a result of the heart rhythm.  Normal rhythm must be restored as fast as possible to protect the brain and heart from further damage.  It may save a life. This may also be a scheduled procedure for irregular or fast heart rhythms that are not immediately life-threatening. Tell a health care provider about:  Any allergies you have.  All medicines you are taking, including vitamins,  herbs, eye drops, creams, and over-the-counter medicines.  Any problems you or family members have had with anesthetic medicines.  Any blood disorders you have.  Any surgeries you have had.  Any medical conditions you have.  Whether you are pregnant or may be pregnant. What are the risks? Generally, this is a safe procedure. However, problems may occur, including:  Allergic reactions to medicines.  A blood clot that breaks free and travels to other parts of your body.  The possible return of an abnormal heart rhythm within hours or days after the procedure.  Your heart stopping (cardiac arrest). This is rare. What happens before the procedure? Medicines  Your health care provider may have you start taking: ? Blood-thinning medicines (anticoagulants) so your blood does not clot as easily. ? Medicines to help stabilize your heart rate and rhythm.  Ask your health care provider about: ? Changing or stopping your regular medicines. This is especially important if you are taking diabetes medicines or blood thinners. ? Taking medicines such as aspirin and ibuprofen. These medicines can thin your blood. Do not take these medicines unless your health care provider tells you to take them. ? Taking over-the-counter medicines, vitamins, herbs, and supplements. General instructions  Follow instructions from your health care provider about eating or drinking restrictions.  Plan to have someone take you home from the hospital or clinic.  If you will be going home right after the procedure, plan to have someone with you for 24 hours.  Ask your health care provider what steps will be taken to help prevent infection. These may include washing your skin with a germ-killing soap. What happens during the procedure?   An IV will be inserted into one of your veins.  Sticky patches (electrodes) or metal paddles may be placed on your chest.  You will be given a medicine to help you relax  (sedative).  An electrical shock will be delivered. The procedure may vary among health care providers and hospitals. What can I expect after the procedure?  Your blood pressure, heart rate, breathing rate, and blood oxygen level will be monitored until you leave the hospital or clinic.  Your heart rhythm will be watched to make sure it does not change.  You may have some redness on the skin where the shocks were given. Follow these instructions at home:  Do not drive for 24 hours if you were given a sedative during your procedure.  Take over-the-counter and prescription medicines only as told by your health care provider.  Ask your health care provider how to check your pulse. Check it often.  Rest for 48 hours after the procedure or as told by your health care provider.  Avoid or limit your caffeine use as told by your health care provider.  Keep all follow-up visits as told by your health care provider. This is important. Contact a health care provider if:  You feel like your heart is beating too quickly or your pulse is not regular.  You have a serious muscle cramp that does not go away. Get help right away if:  You have discomfort in your chest.  You are dizzy or you feel faint.  You have trouble breathing or you are short of breath.  Your speech is slurred.  You have trouble moving an arm or leg on one side of your body.  Your fingers or toes turn cold or blue. Summary  Electrical cardioversion is the delivery of a jolt of electricity to restore a normal rhythm to the heart.  This procedure may be done right away in an emergency or may be a scheduled procedure if the condition is not an emergency.  Generally, this is a safe procedure.  After the procedure, check your pulse often as told by your health care provider. This information is not intended to replace advice given to you by your health care provider. Make sure you discuss any questions you have with  your health care provider. Document Revised: 02/06/2019 Document Reviewed: 02/06/2019 Elsevier Patient Education  [employer].

## 2020-06-21 LAB — CBC WITH DIFFERENTIAL/PLATELET
Basophils Absolute: 0 10*3/uL (ref 0.0–0.2)
Basos: 1 %
EOS (ABSOLUTE): 0.1 10*3/uL (ref 0.0–0.4)
Eos: 3 %
Hematocrit: 35.5 % (ref 34.0–46.6)
Hemoglobin: 12.1 g/dL (ref 11.1–15.9)
Immature Grans (Abs): 0 10*3/uL (ref 0.0–0.1)
Immature Granulocytes: 0 %
Lymphocytes Absolute: 1.7 10*3/uL (ref 0.7–3.1)
Lymphs: 30 %
MCH: 37.2 pg — ABNORMAL HIGH (ref 26.6–33.0)
MCHC: 34.1 g/dL (ref 31.5–35.7)
MCV: 109 fL — ABNORMAL HIGH (ref 79–97)
Monocytes Absolute: 0.9 10*3/uL (ref 0.1–0.9)
Monocytes: 16 %
Neutrophils Absolute: 2.8 10*3/uL (ref 1.4–7.0)
Neutrophils: 50 %
Platelets: 240 10*3/uL (ref 150–450)
RBC: 3.25 x10E6/uL — ABNORMAL LOW (ref 3.77–5.28)
RDW: 12 % (ref 11.7–15.4)
WBC: 5.6 10*3/uL (ref 3.4–10.8)

## 2020-06-21 LAB — BASIC METABOLIC PANEL
BUN/Creatinine Ratio: 25 (ref 12–28)
BUN: 26 mg/dL (ref 8–27)
CO2: 23 mmol/L (ref 20–29)
Calcium: 10 mg/dL (ref 8.7–10.3)
Chloride: 100 mmol/L (ref 96–106)
Creatinine, Ser: 1.03 mg/dL — ABNORMAL HIGH (ref 0.57–1.00)
GFR calc Af Amer: 59 mL/min/{1.73_m2} — ABNORMAL LOW (ref >59)
GFR calc non Af Amer: 51 mL/min/{1.73_m2} — ABNORMAL LOW (ref >59)
Glucose: 100 mg/dL — ABNORMAL HIGH (ref 65–99)
Potassium: 5.1 mmol/L (ref 3.5–5.2)
Sodium: 143 mmol/L (ref 134–144)

## 2020-06-23 LAB — SARS CORONAVIRUS 2 (TAT 6-24 HRS): SARS Coronavirus 2: NEGATIVE

## 2020-06-24 ENCOUNTER — Encounter: Payer: Self-pay | Admitting: Cardiovascular Disease

## 2020-06-24 ENCOUNTER — Encounter
Admission: RE | Disposition: A | Discharge status: Home / Self Care | Payer: Self-pay | Source: Home / Self Care | Attending: Cardiovascular Disease

## 2020-06-24 ENCOUNTER — Ambulatory Visit: Payer: Medicare Other | Admitting: Anesthesiology

## 2020-06-24 ENCOUNTER — Ambulatory Visit
Admission: RE | Admit: 2020-06-24 | Discharge: 2020-06-24 | Disposition: A | Discharge status: Home / Self Care | Payer: Medicare Other | Attending: Cardiovascular Disease | Admitting: Cardiovascular Disease

## 2020-06-24 DIAGNOSIS — I10 Essential (primary) hypertension: Secondary | ICD-10-CM | POA: Insufficient documentation

## 2020-06-24 DIAGNOSIS — Z20822 Contact with and (suspected) exposure to covid-19: Secondary | ICD-10-CM | POA: Insufficient documentation

## 2020-06-24 DIAGNOSIS — I4819 Other persistent atrial fibrillation: Secondary | ICD-10-CM | POA: Insufficient documentation

## 2020-06-24 DIAGNOSIS — N183 Chronic kidney disease, stage 3 unspecified: Secondary | ICD-10-CM | POA: Diagnosis not present

## 2020-06-24 DIAGNOSIS — Z7901 Long term (current) use of anticoagulants: Secondary | ICD-10-CM | POA: Diagnosis not present

## 2020-06-24 DIAGNOSIS — Z79899 Other long term (current) drug therapy: Secondary | ICD-10-CM | POA: Diagnosis not present

## 2020-06-24 DIAGNOSIS — I4891 Unspecified atrial fibrillation: Secondary | ICD-10-CM | POA: Diagnosis not present

## 2020-06-24 DIAGNOSIS — I739 Peripheral vascular disease, unspecified: Secondary | ICD-10-CM | POA: Insufficient documentation

## 2020-06-24 DIAGNOSIS — L4050 Arthropathic psoriasis, unspecified: Secondary | ICD-10-CM | POA: Diagnosis not present

## 2020-06-24 DIAGNOSIS — F418 Other specified anxiety disorders: Secondary | ICD-10-CM | POA: Diagnosis not present

## 2020-06-24 DIAGNOSIS — Z888 Allergy status to other drugs, medicaments and biological substances status: Secondary | ICD-10-CM | POA: Insufficient documentation

## 2020-06-24 DIAGNOSIS — I129 Hypertensive chronic kidney disease with stage 1 through stage 4 chronic kidney disease, or unspecified chronic kidney disease: Secondary | ICD-10-CM | POA: Diagnosis not present

## 2020-06-24 HISTORY — PX: CARDIOVERSION: SHX1299

## 2020-06-24 SURGERY — CARDIOVERSION
Anesthesia: General

## 2020-06-24 MED ORDER — PROPOFOL 500 MG/50ML IV EMUL
INTRAVENOUS | Status: AC
  Filled 2020-06-24: qty 350

## 2020-06-24 MED ORDER — GLYCOPYRROLATE 0.2 MG/ML IJ SOLN
INTRAMUSCULAR | Status: AC
  Filled 2020-06-24: qty 5

## 2020-06-24 MED ORDER — PROPOFOL 500 MG/50ML IV EMUL
INTRAVENOUS | Status: AC
  Filled 2020-06-24: qty 100

## 2020-06-24 MED ORDER — SODIUM CHLORIDE 0.9 % IV SOLN: INTRAVENOUS | Status: DC | PRN

## 2020-06-24 MED ORDER — PROPOFOL 10 MG/ML IV BOLUS
INTRAVENOUS | Status: AC
  Filled 2020-06-24: qty 40

## 2020-06-24 MED ORDER — PROPOFOL 10 MG/ML IV BOLUS
INTRAVENOUS | Status: AC
  Filled 2020-06-24: qty 20

## 2020-06-24 MED ORDER — PROPOFOL 10 MG/ML IV BOLUS
INTRAVENOUS | Status: DC | PRN
  Administered 2020-06-24: 60 mg via INTRAVENOUS

## 2020-06-24 NOTE — Anesthesia Procedure Notes (Signed)
Procedure Name: General with mask airway Performed by: Kelton Pillar, CRNA Pre-anesthesia Checklist: Patient identified, Suction available, Patient being monitored and Emergency Drugs available Patient Re-evaluated:Patient Re-evaluated prior to induction Oxygen Delivery Method: Simple face mask Induction Type: IV induction Placement Confirmation: positive ETCO2 and CO2 detector Dental Injury: Teeth and Oropharynx as per pre-operative assessment

## 2020-06-24 NOTE — H&P (Signed)
Patient Care Team: [name], MD as PCP - General (Family Medicine) [name], MD as PCP - Cardiology (Cardiology) [name], MD (Family Medicine)   HPI  [name] is a 79 y.o. female Seen in follow-up for recent hospitalization having presented with acute shortness of breath and found to be in rapid atrial fibrillation.  Rate control was accomplished with beta-blockers and calcium blockers and anticoagulation with Xarelto; now on BB alone  She has had recurrent atrial fib with RVR  UnderwentDCCV with rapid reversion  Started flecainide 5/19  No interval Afib of which she is aware; however noted by PCP early November to be in atrial fibrillation.  Has had some increasing dyspnea.  Not clearly related to A. Fib.  No chest pain or edema  Hand arthritis seen by Dr. Abner Greenspan.  Psoriatic arthritis.  No specific therapy.    Seen in ER following a fall while Inebriated ( ER BAL >200)  Discussed with DR TT--drinking at least 2 ounces of vodka at night and some wine.  There have been concerns from the children about drinking too much.  Feels doted upon by her grandchildren and appreciated by her great-grandchildren  Her son has reached back out to her.  Her episodically returning prodigal.   noted to have chronic anemia    DATE PR interval QRSduration Dose-Flec  5/19  NA 78 0  6/19 210 84 50  9/19 210 90 50  10/20 216 92 50  5/21 226 94 50       \]     DATE TEST EF   2/19 Echo   65 % LAE (48/2.8/46)  9/21 Echo  60-65%     Date Cr K Hgb  2/19 0.8 3.9 12.5  5/19  1.12 3.8   6/20 0.97 4.4 12.4  12/20 0.9  (CE) 2/21 4.2  10.6  9/21 1.11<<1.7 5.0 10.3<<8.5               Thromboembolic risk factors ( age  -79, HTN-1, Vasc disease -1, Gender-1) for a CHADSVASc Score of 5       She is [name]' s wife.          Past Surgical History:  Procedure Laterality Date  . ABDOMINAL HYSTERECTOMY    . ANKLE  FRACTURE SURGERY  4/08   left---hardware still in place  . 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 BREAST EXCISIONAL BIOPSY Left yrs ago   benign  . BREAST LUMPECTOMY Left 2001   f/u radiation  . CARDIOVERSION N/A 10/04/2017   Procedure: CARDIOVERSION;  Surgeon: Wellington Hampshire, MD;  Location: [facility] ORS;  Service: Cardiovascular;  Laterality: N/A;  . CARDIOVERSION N/A 12/16/2017   Procedure: CARDIOVERSION;  Surgeon: [name], MD;  Location: [facility] ORS;  Service: Cardiovascular;  Laterality: N/A;  . CAROTID ENDARTERECTOMY Left 10/23/2004  . FRACTURE SURGERY    . OOPHORECTOMY    . SHOULDER SURGERY  6/07   left  . TONSILLECTOMY AND ADENOIDECTOMY    . TOTAL HIP ARTHROPLASTY  2004   right          Current Outpatient Medications  Medication Sig Dispense Refill  . allopurinol (ZYLOPRIM) 100 MG tablet Take 100 mg by mouth daily.    . Cholecalciferol (VITAMIN D3) 1000 units CAPS Take 1 capsule by mouth 2 (two) times daily.     . flecainide (TAMBOCOR) 50 MG tablet Take 50 mg by mouth 2 (  two) times daily.    . furosemide (LASIX) 40 MG tablet Take additional 1/2 tablet (20 mg total) in the afternoon. 90 tablet 1  . LORazepam (ATIVAN) 0.5 MG tablet TAKE 1 TABLET BY MOUTH TWICE A DAY AS NEEDED FOR ANXIETY 60 tablet 0  . losartan (COZAAR) 50 MG tablet Take 1 tablet (50 mg total) by mouth daily. 90 tablet 3  . metoprolol tartrate (LOPRESSOR) 50 MG tablet Take 1/2 tablet (25 mg) by mouth twice daily 90 tablet 1  . mirtazapine (REMERON) 15 MG tablet Take 15 mg by mouth at bedtime.    . Multiple Vitamin (MULTIVITAMIN) capsule Take 1 capsule by mouth daily.      Marland Kitchen PRADAXA 150 MG CAPS capsule TAKE 1 CAPSULE BY MOUTH TWICE A DAY 180 capsule 1   No current facility-administered medications for this visit.         Allergies  Allergen Reactions  . Adhesive [Tape] Other (See Comments)    "Does not work well with me"      Review of Systems negative except  from HPI and PMH  Physical Exam BP (!) 161/103 (BP Location: Right Arm, Patient Position: Sitting, Cuff Size: Normal)   Pulse (!) 121   Ht 5\' 6"  (1.676 m)   Wt 116 lb 4 oz (52.7 kg)   BMI 18.76 kg/m  Well developed and nourished in no acute distress HENT normal Neck supple with JVP-  Flat  Clear Rapid and irregular rate and rhythm, no murmurs or gallops Abd-soft with active BS No Clubbing cyanosis edema Skin-warm and dry A & Oriented  Grossly normal sensory and motor function  ECG atrial fibrillation at 121 Interval-/10/35   Assessment and  Plan  Atrial fibrillation-persistent with a rapid rate  Sinus brady  Grief   Alcohol use/ (As above)   Anemia   Hypertension   Arthritis-psoriatic  Peripheral vascular disease carotid artery occlusion  No interval palpitations.  Unfortunately back in atrial fibrillation with a rapid rate.  We will undertake cardioversion.  With history of sinus bradycardia, will not augment rate control at this time.    Tolerating flecainide--may be appropriate to increase dose.  Discussed the interplay between alcohol and atrial fibrillation.  Blood pressure is also elevated.  I have asked her to check her blood pressures regularly at home and will need to review these data.  We will have her see an APP following cardioversion and then I will see her again in 3 months.  Suspect augmented therapy will be necessary   Discussed alcohol, her alcohol levels at the time of her fall, her habits particularly in light of living alone, thinking in terms of the rest of her family  With her persistent atrial fibrillation we will undertake cardioversion  Her son recently reached out.   Addendum on 06/24/2020: I reviewed the note by Dr. Caryl Comes.  The patient presents for elective cardioversion for persistent atrial fibrillation.  Discussed the procedure again with the patient and she has no further questions.  She has not missed any dose of  her blood thinner.

## 2020-06-24 NOTE — CV Procedure (Signed)
Cardioversion note: A standard informed consent was obtained. Timeout was performed. The pads were placed in the anterior posterior fashion. The patient was given propofol by the anesthesia team.  Successful cardioversion was performed with a 200 J. The patient converted to sinus rhythm with intermittent sinus bradycardia. Pre-and post EKGs were reviewed. The patient tolerated the procedure with no immediate complications.  Recommendations: Continue same medications and follow-up in 2-3 weeks.

## 2020-06-24 NOTE — Anesthesia Preprocedure Evaluation (Addendum)
Anesthesia Evaluation  Patient identified by MRN, date of birth, ID band Patient awake    Reviewed: Allergy & Precautions, NPO status , Patient's Chart, lab work & pertinent test results  History of Anesthesia Complications Negative for: history of anesthetic complications  Airway Mallampati: II       Dental   Pulmonary neg sleep apnea, neg COPD, Not current smoker, former smoker,           Cardiovascular hypertension, Pt. on medications + dysrhythmias Atrial Fibrillation      Neuro/Psych neg Seizures Anxiety Depression    GI/Hepatic GERD  ,  Endo/Other    Renal/GU Renal InsufficiencyRenal disease     Musculoskeletal   Abdominal   Peds  Hematology  (+) anemia ,   Anesthesia Other Findings   Reproductive/Obstetrics                            Anesthesia Physical Anesthesia Plan  ASA: III  Anesthesia Plan: General   Post-op Pain Management:    Induction: Intravenous  PONV Risk Score and Plan: 3 and Propofol infusion, TIVA and Treatment may vary due to age or medical condition  Airway Management Planned: Nasal Cannula  Additional Equipment:   Intra-op Plan:   Post-operative Plan:   Informed Consent: I have reviewed the patients History and Physical, chart, labs and discussed the procedure including the risks, benefits and alternatives for the proposed anesthesia with the patient or authorized representative who has indicated his/her understanding and acceptance.       Plan Discussed with:   Anesthesia Plan Comments:         Anesthesia Quick Evaluation

## 2020-06-24 NOTE — Transfer of Care (Signed)
Immediate Anesthesia Transfer of Care Note  Patient: [name]  Procedure(s) Performed: CARDIOVERSION (N/A )  Patient Location: Cath Lab  Anesthesia Type:General  Level of Consciousness: drowsy and responds to stimulation  Airway & Oxygen Therapy: Patient Spontanous Breathing and Patient connected to face mask oxygen  Post-op Assessment: Report given to RN and Post -op Vital signs reviewed and stable  Post vital signs: Reviewed and stable  Last Vitals:  Vitals Value Taken Time  BP 117/96 06/24/20 0740  Temp    Pulse 112 06/24/20 0740  Resp 13 06/24/20 0741  SpO2 100 % 06/24/20 0740    Last Pain:  Vitals:   06/24/20 0724  TempSrc: Oral  PainSc: 0-No pain         Complications: No complications documented.

## 2020-06-25 ENCOUNTER — Encounter: Payer: Self-pay | Admitting: Cardiovascular Disease

## 2020-06-25 NOTE — Anesthesia Postprocedure Evaluation (Signed)
Anesthesia Post Note  Patient: [name]  Procedure(s) Performed: CARDIOVERSION (N/A )  Patient location during evaluation: Endoscopy Anesthesia Type: General Level of consciousness: awake and alert Pain management: pain level controlled Vital Signs Assessment: post-procedure vital signs reviewed and stable Respiratory status: spontaneous breathing and respiratory function stable Cardiovascular status: stable Anesthetic complications: no   No complications documented.   Last Vitals:  Vitals:   06/24/20 0815 06/24/20 0830  BP: 116/73 127/85  Pulse:  75  Resp:  (!) 23  Temp:    SpO2: 98% 99%    Last Pain:  Vitals:   06/24/20 0830  TempSrc:   PainSc: 0-No pain                 Yannely Kintzel K

## 2020-07-04 ENCOUNTER — Encounter: Payer: Self-pay | Admitting: Nurse Practitioner

## 2020-07-04 NOTE — Progress Notes (Signed)
Office Visit    Patient Name: [name] Date of Encounter: 07/05/2020  Primary Care Provider:  [name], MD Primary Cardiologist:  Virl Axe, MD  Chief Complaint    79 year old female with history of persistent atrial fibrillation, HFpEF, left breast cancer, carotid arterial disease, GERD, hypertension, and hyperlipidemia, who presents for follow-up after recent cardioversion.  Past Medical History    Past Medical History:  Diagnosis Date  . (HFpEF) heart failure with preserved ejection fraction ([place])    a. 08/2017 Echo: EF 55-60%, no rwma, mild MR, mildly dil LA, nl RV fxn; b. 03/2020 Echo: EF 60-65%, no rwma, Gr2 DD. RVSP 44.52mmHg. Mod dil LA. Mild MR.  . Breast cancer ([place]) 2001   left breast  . Cancer ([place]) 2001   left breast ca  . Carotid arterial disease ([place])    a. 10/2004 s/p L CEA; b. 12/2015 Carotid U/S: RICA 1-39%; b. LICA patent CEA site.  . Closed right hip fracture ([place]) 09/20/2019  . Dysrhythmia   . GERD (gastroesophageal reflux disease)   . Hyperlipidemia   . Hypertension   . Osteoarthritis, multiple sites   . Osteopenia   . Persistent atrial fibrillation ([place])    a. Dx 08/2017; b. CHA2DS2VASc = 6-->Pradaxa; c. 09/2017 Successful DCCV (second shock - 200J); d. 10/2017 Recurrent Afib-->flecainide started 11/2017; e. 06/2020 s/p DCCV (200J x 1).  . Personal history of radiation therapy 2001   left breast ca  . Psoriasis    Past Surgical History:  Procedure Laterality Date  . ABDOMINAL HYSTERECTOMY    . ANKLE FRACTURE SURGERY  4/08   left---hardware still in place  . 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 BREAST EXCISIONAL BIOPSY Left yrs ago   benign  . BREAST LUMPECTOMY Left 2001   f/u radiation  . CARDIOVERSION N/A 10/04/2017   Procedure: CARDIOVERSION;  Surgeon: Wellington Hampshire, MD;  Location: [facility];  Service: Cardiovascular;  Laterality: N/A;  . CARDIOVERSION N/A 12/16/2017   Procedure: CARDIOVERSION;  Surgeon: [name],  MD;  Location: [facility];  Service: Cardiovascular;  Laterality: N/A;  . CARDIOVERSION N/A 06/24/2020   Procedure: CARDIOVERSION;  Surgeon: Wellington Hampshire, MD;  Location: [facility];  Service: Cardiovascular;  Laterality: N/A;  . CAROTID ENDARTERECTOMY Left 10/23/2004  . FRACTURE SURGERY    . OOPHORECTOMY    . SHOULDER SURGERY  6/07   left  . TONSILLECTOMY AND ADENOIDECTOMY    . TOTAL HIP ARTHROPLASTY  2004   right    Allergies  Allergies  Allergen Reactions  . Adhesive [Tape] Other (See Comments)    "Does not work well with me"    History of Present Illness    79 year old female with above past medical history including hypertension, hyperlipidemia, carotid arterial disease status post left carotid enterectomy, left breast cancer, arthritis, and osteopenia.  In February 2019, she was admitted to [facility] with progressive lower extremity swelling and dyspnea times several months.  She was found to be in rapid atrial fibrillation was placed on beta-blocker, diltiazem, and Xarelto therapy.  Echocardiogram showed normal LV function and she was subsequently discharged following diuresis and adequate rate control.  She underwent cardioversion in March 2019 but was back in A. fib at the time of follow-up in April 2019.  She was subsequently placed on flecainide and Xarelto therapy has since been switched to dabigatran.  In the setting of dyspnea on exertion in August 2021, she underwent echocardiography that  showed normal LV function (60-65%), with grade 2 diastolic dysfunction and elevated RVSP of 44.7 mmHg.  She was advised at that time to increase Lasix to 40 mg in the morning and 20 mg in the afternoon.  In early September, she was seen in the ER following a fall and a scalp laceration with significant blood loss.  EtOH level was 269 at that time.  She was last seen in cardiology clinic on December 2 after being noted to be in recurrent atrial fibrillation by her primary care provider in  November.  She was having some increase in dyspnea on exertion.  She was advised to cut back on alcohol and scheduled for cardioversion which was successfully carried out on December 6.  She notes that following cardioversion she did have a little bit more energy but over the past 2 weeks, she has noted some decline in energy levels.  She does not think she is have any significant dyspnea on exertion, just more in the way of fatigue.  She denies palpitations but is back in atrial fibrillation today.  She says she has cut back on alcohol but has not quit completely.  She denies chest pain, PND, orthopnea, dizziness, syncope, edema, or early satiety.  Home Medications    Prior to Admission medications   Medication Sig Start Date End Date Taking? Authorizing Provider  allopurinol (ZYLOPRIM) 100 MG tablet Take 100 mg by mouth daily. 04/06/19   [provider]  Cholecalciferol (VITAMIN D3) 1000 units CAPS Take 1 capsule by mouth 2 (two) times daily.     [provider]  flecainide (TAMBOCOR) 50 MG tablet Take 50 mg by mouth 2 (two) times daily.    [provider]  furosemide (LASIX) 40 MG tablet Take additional 1/2 tablet (20 mg total) in the afternoon. Patient taking differently: Take 40 mg by mouth daily.  04/02/20   Theora Gianotti, NP  LORazepam (ATIVAN) 0.5 MG tablet TAKE 1 TABLET BY MOUTH TWICE A DAY AS NEEDED FOR ANXIETY Patient taking differently: Take 0.5 mg by mouth 2 (two) times daily as needed for anxiety. TAKE 1 TABLET BY MOUTH TWICE A DAY AS NEEDED FOR ANXIETY 04/01/20   [name], MD  losartan (COZAAR) 50 MG tablet Take 1 tablet (50 mg total) by mouth daily. 02/09/20   [name], MD  metoprolol tartrate (LOPRESSOR) 50 MG tablet Take 1/2 tablet (25 mg) by mouth twice daily Patient taking differently: Take 25 mg by mouth 2 (two) times daily. Take 1/2 tablet (25 mg) by mouth twice daily 01/03/20   [name], MD  Multiple Vitamin  (MULTIVITAMIN) capsule Take 1 capsule by mouth daily.      [provider]  Multiple Vitamins-Minerals (PRESERVISION AREDS 2+MULTI VIT PO) Take 1 capsule by mouth in the morning and at bedtime.    [provider]  PRADAXA 150 MG CAPS capsule TAKE 1 CAPSULE BY MOUTH TWICE A DAY Patient taking differently: Take 150 mg by mouth 2 (two) times daily.  03/14/20   [name], MD    Review of Systems    Initially noted improvement in energy levels following cardioversion but energy levels have since declined some.  She does not feel quite as bad as she felt prior to cardioversion.  She denies chest pain, dyspnea, palpitations, PND, orthopnea, dizziness, syncope, edema, or early satiety.  All other systems reviewed and are otherwise negative except as noted above.  Physical Exam    VS:  BP 108/82 (BP Location: Left Arm, Patient Position: Sitting, Cuff Size: Normal)   Pulse (!) 108   Ht 5\' 6"  (1.676 m)   Wt 115 lb (52.2 kg)   BMI 18.56 kg/m  , BMI Body mass index is 18.56 kg/m. GEN: Well nourished, well developed, in no acute distress. HEENT: normal. Neck: Supple, no JVD, carotid bruits, or masses. Cardiac: Irregularly irregular, mildly tachycardic, no murmurs, rubs, or gallops. No clubbing, cyanosis, edema.  Radials 2+/PT 1+ and equal bilaterally.  Respiratory:  Respirations regular and unlabored, clear to auscultation bilaterally. GI: Soft, nontender, nondistended, BS + x 4. MS: no deformity or atrophy. Skin: warm and dry, no rash. Neuro:  Strength and sensation are intact. Psych: Normal affect.  Accessory Clinical Findings    ECG personally reviewed by me today -A. fib, 108, rightward axis - no acute changes.  Lab Results  Component Value Date   WBC 5.6 06/20/2020   HGB 12.1 06/20/2020   HCT 35.5 06/20/2020   MCV 109 (H) 06/20/2020   PLT 240 06/20/2020   Lab Results  Component Value Date   CREATININE 1.03 (H) 06/20/2020   BUN 26 06/20/2020   NA 143  06/20/2020   K 5.1 06/20/2020   CL 100 06/20/2020   CO2 23 06/20/2020   Lab Results  Component Value Date   ALT 11 03/29/2020   AST 22 03/29/2020   ALKPHOS 88 03/29/2020   BILITOT 0.4 03/29/2020   Lab Results  Component Value Date   CHOL 216 (H) 01/11/2019   HDL 88.90 01/11/2019   LDLCALC 104 (H) 01/11/2019   LDLDIRECT 151.4 02/01/2012   TRIG 116.0 01/11/2019   CHOLHDL 2 01/11/2019    Lab Results  Component Value Date   HGBA1C 5.3 12/18/2014    Assessment & Plan    1.  Persistent atrial fibrillation: Status post cardioversion December 6 which was successful and she did note an improvement in energy immediately following cardioversion but over the past 2 weeks, she has again noted a decline.  She does not think she is quite as fatigued as she was prior to cardioversion.  She is not experiencing chest pain, dyspnea, or palpitations.  She is in atrial fibrillation today at a rate of 108.  She reports compliance with home medications including flecainide, Pradaxa, and metoprolol.  Options for management discussed.  I will increase her flecainide to 75 mg twice daily.  Additional titration will be limited by creatinine clearance.  Plan to follow-up in clinic within the next 2 weeks and if she remains in A. fib at that time, can reconsider cardioversion on the higher dose of flecainide.  Again stressed the importance of alcohol cessation.  2.  Heart failure with preserved ejection fraction: She is not currently experiencing any dyspnea and is euvolemic on examination.  Maintenance of sinus rhythm is likely going to be important in preventing recurrent exacerbations of volume excess.  3.  Essential hypertension: Stable on beta-blocker and ARB.  4.  Hyperlipidemia: LDL of 104 in June 2020.  She has previously refused statins.  5.  Alcohol abuse: She says she has cut back but is still drinking.  We again discussed the role that alcohol plays in recurrent atrial fibrillation.  Complete  cessation advised.  6.  Follow-up in clinic in 2 weeks or sooner if necessary.   Murray Hodgkins, NP 07/05/2020, 9:34 AM

## 2020-07-05 ENCOUNTER — Encounter: Payer: Self-pay | Admitting: Nurse Practitioner

## 2020-07-05 ENCOUNTER — Ambulatory Visit (INDEPENDENT_AMBULATORY_CARE_PROVIDER_SITE_OTHER): Payer: Medicare Other | Admitting: Nurse Practitioner

## 2020-07-05 ENCOUNTER — Other Ambulatory Visit: Payer: Self-pay

## 2020-07-05 VITALS — BP 108/82 | HR 108 | Ht 66.0 in | Wt 115.0 lb

## 2020-07-05 DIAGNOSIS — I5032 Chronic diastolic (congestive) heart failure: Secondary | ICD-10-CM | POA: Diagnosis not present

## 2020-07-05 DIAGNOSIS — F1010 Alcohol abuse, uncomplicated: Secondary | ICD-10-CM | POA: Diagnosis not present

## 2020-07-05 DIAGNOSIS — I4819 Other persistent atrial fibrillation: Secondary | ICD-10-CM | POA: Diagnosis not present

## 2020-07-05 DIAGNOSIS — I10 Essential (primary) hypertension: Secondary | ICD-10-CM | POA: Diagnosis not present

## 2020-07-05 DIAGNOSIS — I6523 Occlusion and stenosis of bilateral carotid arteries: Secondary | ICD-10-CM

## 2020-07-05 MED ORDER — FLECAINIDE ACETATE 50 MG PO TABS
75.0000 mg | ORAL_TABLET | Freq: Two times a day (BID) | ORAL | 2 refills | Status: DC
Start: 2020-07-05 — End: 2020-07-23

## 2020-07-05 NOTE — Patient Instructions (Signed)
Medication Instructions:  Your physician has recommended you make the following change in your medication:   INCREASE Flecainide to 75 mg twice daily. An Rx has been sent to your pharmacy.  *If you need a refill on your cardiac medications before your next appointment, please call your pharmacy*   Lab Work: None odered If you have labs (blood work) drawn today and your tests are completely normal, you will receive your results only by: Marland Kitchen MyChart Message (if you have MyChart) OR . A paper copy in the mail If you have any lab test that is abnormal or we need to change your treatment, we will call you to review the results.   Testing/Procedures: None ordered   Follow-Up: At [facility], you and your health needs are our priority.  As part of our continuing mission to provide you with exceptional heart care, we have created designated Provider Care Teams.  These Care Teams include your primary Cardiologist (physician) and Advanced Practice Providers (APPs -  Physician Assistants and Nurse Practitioners) who all work together to provide you with the care you need, when you need it.  We recommend signing up for the patient portal called "MyChart".  Sign up information is provided on this After Visit Summary.  MyChart is used to connect with patients for Virtual Visits (Telemedicine).  Patients are able to view lab/test results, encounter notes, upcoming appointments, etc.  Non-urgent messages can be sent to your provider as well.   To learn more about what you can do with MyChart, go to [email or web address].    Your next appointment:   2 week(s)  The format for your next appointment:   In Person  Provider:   You may see Virl Axe, MD or one of the following Advanced Practice Providers on your designated Care Team:    Murray Hodgkins, NP    Other Instructions N/A

## 2020-07-17 ENCOUNTER — Other Ambulatory Visit: Payer: Self-pay | Admitting: Internal Medicine

## 2020-07-21 ENCOUNTER — Other Ambulatory Visit: Payer: Self-pay | Admitting: Internal Medicine

## 2020-07-23 NOTE — Telephone Encounter (Signed)
This is a [place] pt 

## 2020-07-25 ENCOUNTER — Ambulatory Visit (INDEPENDENT_AMBULATORY_CARE_PROVIDER_SITE_OTHER): Payer: Medicare Other | Admitting: Nurse Practitioner

## 2020-07-25 ENCOUNTER — Encounter: Payer: Self-pay | Admitting: Nurse Practitioner

## 2020-07-25 ENCOUNTER — Other Ambulatory Visit: Payer: Self-pay

## 2020-07-25 VITALS — BP 152/110 | HR 112 | Ht 66.5 in | Wt 113.0 lb

## 2020-07-25 DIAGNOSIS — I10 Essential (primary) hypertension: Secondary | ICD-10-CM

## 2020-07-25 DIAGNOSIS — F1010 Alcohol abuse, uncomplicated: Secondary | ICD-10-CM

## 2020-07-25 DIAGNOSIS — I5032 Chronic diastolic (congestive) heart failure: Secondary | ICD-10-CM

## 2020-07-25 DIAGNOSIS — I4819 Other persistent atrial fibrillation: Secondary | ICD-10-CM

## 2020-07-25 NOTE — Patient Instructions (Signed)
Medication Instructions:   Your physician recommends that you continue on your current medications as directed. Please refer to the Current Medication list given to you today.   *If you need a refill on your cardiac medications before your next appointment, please call your pharmacy*   Lab Work: None ordered    Testing/Procedures: None ordered   Follow-Up: At [facility], you and your health needs are our priority.  As part of our continuing mission to provide you with exceptional heart care, we have created designated Provider Care Teams.  These Care Teams include your primary Cardiologist (physician) and Advanced Practice Providers (APPs -  Physician Assistants and Nurse Practitioners) who all work together to provide you with the care you need, when you need it.  We recommend signing up for the patient portal called "MyChart".  Sign up information is provided on this After Visit Summary.  MyChart is used to connect with patients for Virtual Visits (Telemedicine).  Patients are able to view lab/test results, encounter notes, upcoming appointments, etc.  Non-urgent messages can be sent to your provider as well.   To learn more about what you can do with MyChart, go to [email or web address].    Your next appointment:   2 week(s) We can schedule this appointment with Dr. Graciela Husbands 08/08/20 @ 8:20 AM  The format for your next appointment:   In Person  Provider:   You may see Sherryl Manges, MD

## 2020-07-25 NOTE — Progress Notes (Signed)
Office Visit    Patient Name: [name] Date of Encounter: 07/25/2020  Primary Care Provider:  [name], MD Primary Cardiologist:  Virl Axe, MD  Chief Complaint    80 year old female with a history of persistent atrial fibrillation, HFpEF, left breast cancer, carotid arterial disease, GERD, hypertension, and hyperlipidemia, who presents for follow-up related to atrial fibrillation.  Past Medical History    Past Medical History:  Diagnosis Date  . (HFpEF) heart failure with preserved ejection fraction ([place])    a. 08/2017 Echo: EF 55-60%, no rwma, mild MR, mildly dil LA, nl RV fxn; b. 03/2020 Echo: EF 60-65%, no rwma, Gr2 DD. RVSP 44.10mmHg. Mod dil LA. Mild MR.  . Breast cancer ([place]) 2001   left breast  . Cancer ([place]) 2001   left breast ca  . Carotid arterial disease ([place])    a. 10/2004 s/p L CEA; b. 12/2015 Carotid U/S: RICA 1-39%; b. LICA patent CEA site.  . Closed right hip fracture ([place]) 09/20/2019  . Dysrhythmia   . GERD (gastroesophageal reflux disease)   . Hyperlipidemia   . Hypertension   . Osteoarthritis, multiple sites   . Osteopenia   . Persistent atrial fibrillation ([place])    a. Dx 08/2017; b. CHA2DS2VASc = 6-->Pradaxa; c. 09/2017 Successful DCCV (second shock - 200J); d. 10/2017 Recurrent Afib-->flecainide started 11/2017; e. 06/2020 s/p DCCV (200J x 1).  . Personal history of radiation therapy 2001   left breast ca  . Psoriasis    Past Surgical History:  Procedure Laterality Date  . ABDOMINAL HYSTERECTOMY    . ANKLE FRACTURE SURGERY  4/08   left---hardware still in place  . 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 BREAST EXCISIONAL BIOPSY Left yrs ago   benign  . BREAST LUMPECTOMY Left 2001   f/u radiation  . CARDIOVERSION N/A 10/04/2017   Procedure: CARDIOVERSION;  Surgeon: Wellington Hampshire, MD;  Location: [facility];  Service: Cardiovascular;  Laterality: N/A;  . CARDIOVERSION N/A 12/16/2017   Procedure: CARDIOVERSION;  Surgeon: [name], MD;  Location: [facility];  Service: Cardiovascular;  Laterality: N/A;  . CARDIOVERSION N/A 06/24/2020   Procedure: CARDIOVERSION;  Surgeon: Wellington Hampshire, MD;  Location: [facility];  Service: Cardiovascular;  Laterality: N/A;  . CAROTID ENDARTERECTOMY Left 10/23/2004  . FRACTURE SURGERY    . OOPHORECTOMY    . SHOULDER SURGERY  6/07   left  . TONSILLECTOMY AND ADENOIDECTOMY    . TOTAL HIP ARTHROPLASTY  2004   right    Allergies  Allergies  Allergen Reactions  . Adhesive [Tape] Other (See Comments)    "Does not work well with me"    History of Present Illness    80 year old female with above past medical history including hypertension, hyperlipidemia, carotid arterial disease status post left carotid enterectomy, left breast cancer, arthritis, and osteopenia.  In February 2019, she was admitted to [facility] with progressive lower extremity swelling and dyspnea times several months.  She was found to be in rapid atrial fibrillation was placed on beta-blocker, diltiazem, and Xarelto therapy.  Echocardiogram showed normal LV function and she was subsequently discharged following diuresis and adequate rate control.  She underwent cardioversion in March 2019 but was back in A. fib at the time of follow-up in April 2019.  She was subsequently placed on flecainide and Xarelto therapy has since been switched to dabigatran.  In the setting of dyspnea on exertion in August 2021, she underwent  echocardiography that showed normal LV function (60-65%), with grade 2 diastolic dysfunction and elevated RVSP of 44.7 mmHg.  She was advised at that time to increase Lasix to 40 mg in the morning and 20 mg in the afternoon.  In early September, she was seen in the ER following a fall and a scalp laceration with significant blood loss.  EtOH level was 269 at that time.  Cardiology follow-up on June 20, 2020, she was noted to be back in atrial fibrillation and reported increase in dyspnea on  exertion.  She was advised to cut back on alcohol and underwent successful cardioversion on December 6.  She initially noted improved energy but shortly thereafter, began to experience increasing fatigue.  At office follow-up on December 17, she was noted to be back in atrial fibrillation at a rate of 108.  Flecainide was increased to 75 mg twice daily.  She has tolerated this but remains in A. fib today.  She continues to note fatigue but overall has been stable.  She denies chest pain, palpitations, dyspnea, PND, orthopnea, dizziness, syncope, edema, or early satiety.  She says she is continue to work on cutting back on drinking but has not quit yet.  Home Medications    Prior to Admission medications   Medication Sig Start Date End Date Taking? Authorizing Provider  allopurinol (ZYLOPRIM) 100 MG tablet Take 100 mg by mouth daily. 04/06/19   [provider]  Cholecalciferol (VITAMIN D3) 1000 units CAPS Take 1 capsule by mouth 2 (two) times daily.     [provider]  flecainide (TAMBOCOR) 50 MG tablet Take 1.5 tablets (75 mg total) by mouth 2 (two) times daily. 07/23/20   Creig Hines, NP  furosemide (LASIX) 40 MG tablet Take additional 1/2 tablet (20 mg total) in the afternoon. Patient taking differently: Take 40 mg by mouth daily. 04/02/20   Creig Hines, NP  LORazepam (ATIVAN) 0.5 MG tablet TAKE 1 TABLET BY MOUTH TWICE A DAY AS NEEDED FOR ANXIETY Patient taking differently: Take 0.5 mg by mouth 2 (two) times daily as needed for anxiety. TAKE 1 TABLET BY MOUTH TWICE A DAY AS NEEDED FOR ANXIETY 04/01/20   Glori Luis, MD  losartan (COZAAR) 50 MG tablet Take 1 tablet (50 mg total) by mouth daily. 02/09/20   Glori Luis, MD  metoprolol tartrate (LOPRESSOR) 50 MG tablet TAKE 1/2 TABLET (25 MG) BY MOUTH TWICE DAILY 07/17/20   Duke Salvia, MD  Multiple Vitamin (MULTIVITAMIN) capsule Take 1 capsule by mouth daily.    [provider]   Multiple Vitamins-Minerals (PRESERVISION AREDS 2+MULTI VIT PO) Take 1 capsule by mouth in the morning and at bedtime.    [provider]  PRADAXA 150 MG CAPS capsule TAKE 1 CAPSULE BY MOUTH TWICE A DAY Patient taking differently: Take 150 mg by mouth 2 (two) times daily. 03/14/20   Duke Salvia, MD    Review of Systems    Ongoing fatigue.  She denies chest pain, palpitations, dyspnea, pnd, orthopnea, n, v, dizziness, syncope, edema, weight gain, or early satiety.  All other systems reviewed and are otherwise negative except as noted above.  Physical Exam    VS:  BP (!) 152/110 (BP Location: Right Arm, Patient Position: Sitting, Cuff Size: Normal)   Pulse (!) 112   Ht 5' 6.5" (1.689 m)   Wt 113 lb (51.3 kg)   BMI 17.97 kg/m  , BMI Body mass index is 17.97 kg/m. GEN: Thin,  in no acute distress. HEENT: normal. Neck: Supple, no JVD, carotid bruits, or masses. Cardiac: Irregularly irregular, tachycardic, no murmurs, rubs, or gallops. No clubbing, cyanosis, edema.  Radials/DP/PT 2+ and equal bilaterally.  Respiratory:  Respirations regular and unlabored, clear to auscultation bilaterally. GI: Soft, nontender, nondistended, BS + x 4. MS: no deformity or atrophy. Skin: warm and dry, no rash. Neuro:  Strength and sensation are intact. Psych: Normal affect.  Accessory Clinical Findings    ECG personally reviewed by me today -atrial fibrillation, 112, leftward axis, IVCD- no acute changes.  Lab Results  Component Value Date   WBC 5.6 06/20/2020   HGB 12.1 06/20/2020   HCT 35.5 06/20/2020   MCV 109 (H) 06/20/2020   PLT 240 06/20/2020   Lab Results  Component Value Date   CREATININE 1.03 (H) 06/20/2020   BUN 26 06/20/2020   NA 143 06/20/2020   K 5.1 06/20/2020   CL 100 06/20/2020   CO2 23 06/20/2020   Lab Results  Component Value Date   ALT 11 03/29/2020   AST 22 03/29/2020   ALKPHOS 88 03/29/2020   BILITOT 0.4 03/29/2020   Lab Results  Component Value Date    CHOL 216 (H) 01/11/2019   HDL 88.90 01/11/2019   LDLCALC 104 (H) 01/11/2019   LDLDIRECT 151.4 02/01/2012   TRIG 116.0 01/11/2019   CHOLHDL 2 01/11/2019    Lab Results  Component Value Date   HGBA1C 5.3 12/18/2014    Assessment & Plan    1.  Persistent atrial fibrillation: Status post cardioversion December 6 which was initially successful though at follow-up visit December 17, she was back in A. fib and noted about 2 weeks of fatigue at that time.  Flecainide was increased to 75 mg twice daily at that time and she remains in atrial fibrillation today.  We discussed potentially pursuing repeat cardioversion however, she is fairly certain she missed a dose of Pradaxa within the past 2 weeks.  Other than fatigue, she is not particularly symptomatic.  She is euvolemic on examination.  Continue current dose of flecainide and I stressed the importance of Pradaxa compliance.  She will follow-up with Dr. Graciela Husbands in approximately 2 weeks to reevaluate.  Again stressed the importance of alcohol cessation.  She has cut back but has yet to quit.  2.  Heart failure with preserved ejection fraction: Euvolemic on examination and not currently experiencing dyspnea.  Historically, maintenance of sinus rhythm has been important in preventing recurrent exacerbations of volume excess.  3.  Essential hypertension: Blood pressure elevated today at 152/110.  Reluctant to titrate beta-blocker given prior history of bradycardia.  Reluctant to titrate losartan given history of hyperkalemia with potassium of 5.1 on December 2.  We discussed that the addition of amlodipine might be appropriate however, she wishes to defer changing any of her medications at this time.  4.  Hyperlipidemia: LDL of 104 in June 2020.  She has previously refused statins.  5.  Alcohol abuse: She says she is working on cutting back but is still drinking.  She understands the role that alcohol plays in recurrent atrial fibrillation.  Complete  cessation advised.  6.  Disposition: She will follow-up with Dr. Graciela Husbands in approximately 2 weeks.   Nicolasa Ducking, NP 07/25/2020, 1:12 PM

## 2020-07-26 ENCOUNTER — Inpatient Hospital Stay: Payer: Medicare Other | Attending: Oncology

## 2020-07-26 ENCOUNTER — Other Ambulatory Visit: Payer: Self-pay | Admitting: *Deleted

## 2020-07-26 DIAGNOSIS — D472 Monoclonal gammopathy: Secondary | ICD-10-CM | POA: Diagnosis not present

## 2020-07-26 LAB — CBC WITH DIFFERENTIAL/PLATELET
Abs Immature Granulocytes: 0.03 10*3/uL (ref 0.00–0.07)
Basophils Absolute: 0 10*3/uL (ref 0.0–0.1)
Basophils Relative: 1 %
Eosinophils Absolute: 0.1 10*3/uL (ref 0.0–0.5)
Eosinophils Relative: 3 %
HCT: 36.5 % (ref 36.0–46.0)
Hemoglobin: 12.6 g/dL (ref 12.0–15.0)
Immature Granulocytes: 1 %
Lymphocytes Relative: 21 %
Lymphs Abs: 1.2 10*3/uL (ref 0.7–4.0)
MCH: 36.5 pg — ABNORMAL HIGH (ref 26.0–34.0)
MCHC: 34.5 g/dL (ref 30.0–36.0)
MCV: 105.8 fL — ABNORMAL HIGH (ref 80.0–100.0)
Monocytes Absolute: 0.8 10*3/uL (ref 0.1–1.0)
Monocytes Relative: 15 %
Neutro Abs: 3.3 10*3/uL (ref 1.7–7.7)
Neutrophils Relative %: 59 %
Platelets: 283 10*3/uL (ref 150–400)
RBC: 3.45 MIL/uL — ABNORMAL LOW (ref 3.87–5.11)
RDW: 13.7 % (ref 11.5–15.5)
WBC: 5.6 10*3/uL (ref 4.0–10.5)
nRBC: 0 % (ref 0.0–0.2)

## 2020-07-26 LAB — BASIC METABOLIC PANEL
Anion gap: 16 — ABNORMAL HIGH (ref 5–15)
BUN: 44 mg/dL — ABNORMAL HIGH (ref 8–23)
CO2: 23 mmol/L (ref 22–32)
Calcium: 9.7 mg/dL (ref 8.9–10.3)
Chloride: 98 mmol/L (ref 98–111)
Creatinine, Ser: 1.49 mg/dL — ABNORMAL HIGH (ref 0.44–1.00)
GFR, Estimated: 35 mL/min — ABNORMAL LOW (ref >60)
Glucose, Bld: 109 mg/dL — ABNORMAL HIGH (ref 70–99)
Potassium: 4.2 mmol/L (ref 3.5–5.1)
Sodium: 137 mmol/L (ref 135–145)

## 2020-07-27 LAB — IGG, IGA, IGM
IgA: 185 mg/dL (ref 64–422)
IgG (Immunoglobin G), Serum: 361 mg/dL — ABNORMAL LOW (ref 586–1602)
IgM (Immunoglobulin M), Srm: 1305 mg/dL — ABNORMAL HIGH (ref 26–217)

## 2020-07-29 LAB — KAPPA/LAMBDA LIGHT CHAINS
Kappa free light chain: 375.8 mg/L — ABNORMAL HIGH (ref 3.3–19.4)
Kappa, lambda light chain ratio: 13.19 — ABNORMAL HIGH (ref 0.26–1.65)
Lambda free light chains: 28.5 mg/L — ABNORMAL HIGH (ref 5.7–26.3)

## 2020-07-29 LAB — PROTEIN ELECTROPHORESIS, SERUM
A/G Ratio: 1.3 (ref 0.7–1.7)
Albumin ELP: 3.8 g/dL (ref 2.9–4.4)
Alpha-1-Globulin: 0.3 g/dL (ref 0.0–0.4)
Alpha-2-Globulin: 0.7 g/dL (ref 0.4–1.0)
Beta Globulin: 0.9 g/dL (ref 0.7–1.3)
Gamma Globulin: 1.1 g/dL (ref 0.4–1.8)
Globulin, Total: 3 g/dL (ref 2.2–3.9)
M-Spike, %: 0.8 g/dL — ABNORMAL HIGH
Total Protein ELP: 6.8 g/dL (ref 6.0–8.5)

## 2020-08-02 ENCOUNTER — Inpatient Hospital Stay: Payer: Medicare Other | Admitting: Oncology

## 2020-08-02 NOTE — Progress Notes (Deleted)
[place]  [phone]) [phone] [phone]) [phone]  ID: [name] OB: 1941/03/25  MR#: [id number]  WUX#:[id number]  Patient Care Team: [name], MD as PCP - General (Family Medicine) [name], MD as PCP - Cardiology (Cardiology) [name], MD (Family Medicine)  CHIEF COMPLAINT: MGUS, macrocytic anemia.  INTERVAL HISTORY: Patient is an 80 year old female who was last seen in clinic on 04/25/2020.  Apparently, she was previously seen approximately 7 years ago for MGUS and macrocytic anemia.  Labs from 04/25/2020 showed a hemoglobin of 10.3, M spike 0.7, kappa lambda light chain ratio 15.4, kappa free light chain 440.3.  B12 and folate levels were normal.  In the interim, she has done well.  She is followed closely by her PCP Dr. Caryl Bis for management of chronic comorbidities; htn, chf, afib , alcohol abuse.   She has longstanding history of atrial fibrillation with several cardioversions.  Had a fall a few months back and was seen in the emergency room and diagnosed with A. fib with RVR.  She had cardioversion on 06/24/2020 with successful conversion.  On follow-up with cardiology a few weeks later, she was found to be back in atrial fibrillation with a heart rate of 106.  Her flecainide was increased.   Today, she endorses some fatigue but otherwise is doing well. greater than 7 years ago who is referred back for further evaluation of MGUS and macrocytic anemia.  She currently feels well and is asymptomatic.  She has no neurologic complaints.  She denies any recent fevers or illnesses.  She has a good appetite and denies weight loss.  She has no chest pain, shortness of breath, cough, or hemoptysis.  She denies any nausea, vomiting, constipation, or diarrhea.  She has no urinary complaints.  Patient feels at her baseline and offers no specific complaints today.  REVIEW OF SYSTEMS:   Review of Systems  Constitutional: Negative.   Negative for fever, malaise/fatigue and weight loss.  Respiratory: Negative.  Negative for cough, hemoptysis and shortness of breath.   Cardiovascular: Negative.  Negative for chest pain and leg swelling.  Gastrointestinal: Negative.  Negative for abdominal pain, blood in stool and melena.  Genitourinary: Negative.  Negative for dysuria and hematuria.  Musculoskeletal: Negative.   Skin: Negative.  Negative for rash.  Neurological: Negative.  Negative for dizziness, focal weakness, weakness and headaches.  Psychiatric/Behavioral: Negative.  The patient is not nervous/anxious.     As per HPI. Otherwise, a complete review of systems is negative.  PAST MEDICAL HISTORY: Past Medical History:  Diagnosis Date  . (HFpEF) heart failure with preserved ejection fraction ([place])    a. 08/2017 Echo: EF 55-60%, no rwma, mild MR, mildly dil LA, nl RV fxn; b. 03/2020 Echo: EF 60-65%, no rwma, Gr2 DD. RVSP 44.34mHg. Mod dil LA. Mild MR.  . [place]   left breast  . Cancer ([facility]   left breast ca  . Carotid arterial disease ([facility]    a. 10/2004 s/p L CEA; b. 12/2015 Carotid U/S: RICA 1-39%; b. LICA patent CEA site.  . Closed right hip fracture ([facility] 09/20/2019  . Dysrhythmia   . GERD (gastroesophageal reflux disease)   . Hyperlipidemia   . Hypertension   . Osteoarthritis, multiple sites   . Osteopenia   . Persistent atrial fibrillation ([facility]    a. Dx 08/2017; b. CHA2DS2VASc = 6-->Pradaxa; c. 09/2017 Successful DCCV (second shock - 200J); d. 10/2017 Recurrent Afib-->flecainide started 11/2017; e. 06/2020  s/p DCCV (200J x 1).  . Personal history of radiation therapy 2001   left breast ca  . Psoriasis     PAST SURGICAL HISTORY: Past Surgical History:  Procedure Laterality Date  . ABDOMINAL HYSTERECTOMY    . ANKLE FRACTURE SURGERY  4/08   left---hardware still in place  . 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 BREAST EXCISIONAL BIOPSY Left yrs ago   benign  . BREAST LUMPECTOMY Left  2001   f/u radiation  . CARDIOVERSION N/A 10/04/2017   Procedure: CARDIOVERSION;  Surgeon: Wellington Hampshire, MD;  Location: [facility];  Service: Cardiovascular;  Laterality: N/A;  . CARDIOVERSION N/A 12/16/2017   Procedure: CARDIOVERSION;  Surgeon: [name], MD;  Location: [facility];  Service: Cardiovascular;  Laterality: N/A;  . CARDIOVERSION N/A 06/24/2020   Procedure: CARDIOVERSION;  Surgeon: Wellington Hampshire, MD;  Location: [facility];  Service: Cardiovascular;  Laterality: N/A;  . CAROTID ENDARTERECTOMY Left 10/23/2004  . FRACTURE SURGERY    . OOPHORECTOMY    . SHOULDER SURGERY  6/07   left  . TONSILLECTOMY AND ADENOIDECTOMY    . TOTAL HIP ARTHROPLASTY  2004   right    FAMILY HISTORY: Family History  Problem Relation Age of Onset  . Hypertension Brother   . Pneumonia Mother   . Leukemia Father   . Heart disease Neg Hx   . Diabetes Neg Hx   . Breast cancer Neg Hx     ADVANCED DIRECTIVES (Y/N):  N  HEALTH MAINTENANCE: Social History   Tobacco Use  . Smoking status: Former Smoker    Packs/day: 1.00    Years: 40.00    Pack years: 40.00    Types: Cigarettes    Quit date: 07/21/1995    Years since quitting: 25.0  . Smokeless tobacco: Never Used  Vaping Use  . Vaping Use: Never used  Substance Use Topics  . Alcohol use: Yes    Alcohol/week: 16.0 standard drinks    Types: 2 Glasses of wine, 14 Standard drinks or equivalent per week    Comment: daily 1-2  . Drug use: No     Colonoscopy:  PAP:  Bone density:  Lipid panel:  Allergies  Allergen Reactions  . Adhesive [Tape] Other (See Comments)    "Does not work well with me"    Current Outpatient Medications  Medication Sig Dispense Refill  . allopurinol (ZYLOPRIM) 100 MG tablet Take 100 mg by mouth daily.    . Cholecalciferol (VITAMIN D3) 1000 units CAPS Take 1 capsule by mouth 2 (two) times daily.     . flecainide (TAMBOCOR) 50 MG tablet Take 1.5 tablets (75 mg total) by mouth 2 (two) times daily. 270  tablet 0  . furosemide (LASIX) 40 MG tablet Take 20 mg by mouth daily.    Marland Kitchen LORazepam (ATIVAN) 0.5 MG tablet TAKE 1 TABLET BY MOUTH TWICE A DAY AS NEEDED FOR ANXIETY 60 tablet 0  . losartan (COZAAR) 50 MG tablet Take 1 tablet (50 mg total) by mouth daily. 90 tablet 3  . metoprolol tartrate (LOPRESSOR) 50 MG tablet TAKE 1/2 TABLET (25 MG) BY MOUTH TWICE DAILY 90 tablet 3  . mirtazapine (REMERON) 15 MG tablet Take 15 mg by mouth at bedtime.    . Multiple Vitamin (MULTIVITAMIN) capsule Take 1 capsule by mouth daily.    . Multiple Vitamins-Minerals (PRESERVISION AREDS 2+MULTI VIT PO) Take 1 capsule by mouth in the morning and at bedtime.    Marland Kitchen PRADAXA  150 MG CAPS capsule TAKE 1 CAPSULE BY MOUTH TWICE A DAY 180 capsule 1   No current facility-administered medications for this visit.    OBJECTIVE: There were no vitals filed for this visit.   There is no height or weight on file to calculate BMI.    ECOG FS:0 - Asymptomatic  General: Well-developed, well-nourished, no acute distress. Eyes: Pink conjunctiva, anicteric sclera. HEENT: Normocephalic, moist mucous membranes. Lungs: No audible wheezing or coughing. Heart: Regular rate and rhythm. Abdomen: Soft, nontender, no obvious distention. Musculoskeletal: No edema, cyanosis, or clubbing. Neuro: Alert, answering all questions appropriately. Cranial nerves grossly intact. Skin: No rashes or petechiae noted. Psych: Normal affect. Lymphatics: No cervical, calvicular, axillary or inguinal LAD.   LAB RESULTS:  Lab Results  Component Value Date   NA 137 07/26/2020   K 4.2 07/26/2020   CL 98 07/26/2020   CO2 23 07/26/2020   GLUCOSE 109 (H) 07/26/2020   BUN 44 (H) 07/26/2020   CREATININE 1.49 (H) 07/26/2020   CALCIUM 9.7 07/26/2020   PROT 6.4 04/15/2020   ALBUMIN 3.7 03/29/2020   AST 22 03/29/2020   ALT 11 03/29/2020   ALKPHOS 88 03/29/2020   BILITOT 0.4 03/29/2020   GFRNONAA 35 (L) 07/26/2020   GFRAA 59 (L) 06/20/2020    Lab  Results  Component Value Date   WBC 5.6 07/26/2020   NEUTROABS 3.3 07/26/2020   HGB 12.6 07/26/2020   HCT 36.5 07/26/2020   MCV 105.8 (H) 07/26/2020   PLT 283 07/26/2020     STUDIES: No results found.  ASSESSMENT: MGUS, macrocytic anemia.  PLAN:    1.  MGUS:  -Most recent M spike from 04/25/2020 was 0.7 with elevation in kappa lambda light chain ratio at 15.4. - Has chronic anemia - No evidence of endorgan damage at this time-normal platelet, calcium and creatinine -She has not had a bone survey or bone marrow biopsy -Labs from 07/26/2020 show an M spike of 0.8, and slight decrease in kappa lambda light chain ratio at 13.19. -Slightly worsening creatinine 1.49, calcium is normal   2.  Macrocytic anemia:  -Chronic and unchanged.   - Labs from 07/26/2020 show hemoglobin of 12.6 and MCV of 105.8. -B12 and folate levels are normal - Intelligin myeloid panel pending   3.  History of breast cancer:  -Had lumpectomy in 2001. -Most recent mammogram from 02/08/2020 was reported as BI-RADS 2.  Disposition: -RTC in 3 months with repeat labs and MD assessment  I spent a total of 45 minutes reviewing chart data, face-to-face evaluation with the patient, counseling and coordination of care as detailed above.   Patient expressed understanding and was in agreement with this plan. She also understands that She can call clinic at any time with any questions, concerns, or complaints.    Jacquelin Hawking, NP   08/02/2020 10:29 AM

## 2020-08-05 ENCOUNTER — Other Ambulatory Visit: Payer: Self-pay | Admitting: Family Medicine

## 2020-08-08 ENCOUNTER — Encounter: Payer: Self-pay | Admitting: Internal Medicine

## 2020-08-08 ENCOUNTER — Ambulatory Visit (INDEPENDENT_AMBULATORY_CARE_PROVIDER_SITE_OTHER): Payer: Medicare Other | Admitting: Internal Medicine

## 2020-08-08 ENCOUNTER — Ambulatory Visit: Payer: Medicare Other | Admitting: Podiatry

## 2020-08-08 ENCOUNTER — Other Ambulatory Visit: Payer: Self-pay

## 2020-08-08 VITALS — BP 90/70 | HR 83 | Ht 66.5 in | Wt 114.5 lb

## 2020-08-08 DIAGNOSIS — I5032 Chronic diastolic (congestive) heart failure: Secondary | ICD-10-CM | POA: Diagnosis not present

## 2020-08-08 DIAGNOSIS — E782 Mixed hyperlipidemia: Secondary | ICD-10-CM

## 2020-08-08 DIAGNOSIS — I10 Essential (primary) hypertension: Secondary | ICD-10-CM

## 2020-08-08 DIAGNOSIS — I4819 Other persistent atrial fibrillation: Secondary | ICD-10-CM | POA: Diagnosis not present

## 2020-08-08 MED ORDER — ALLOPURINOL 100 MG PO TABS: 100.0000 mg | ORAL_TABLET | Freq: Every day | ORAL | 0 refills | Status: DC

## 2020-08-08 NOTE — Patient Instructions (Signed)
Medication Instructions:  - Your physician has recommended you make the following change in your medication:   1) STOP cozaar (losartan)  *If you need a refill on your cardiac medications before your next appointment, please call your pharmacy*   Lab Work: - none ordered  If you have labs (blood work) drawn today and your tests are completely normal, you will receive your results only by: Marland Kitchen MyChart Message (if you have MyChart) OR . A paper copy in the mail If you have any lab test that is abnormal or we need to change your treatment, we will call you to review the results.   Testing/Procedures: - none ordered   Follow-Up: At [facility], you and your health needs are our priority.  As part of our continuing mission to provide you with exceptional heart care, we have created designated Provider Care Teams.  These Care Teams include your primary Cardiologist (physician) and Advanced Practice Providers (APPs -  Physician Assistants and Nurse Practitioners) who all work together to provide you with the care you need, when you need it.  We recommend signing up for the patient portal called "MyChart".  Sign up information is provided on this After Visit Summary.  MyChart is used to connect with patients for Virtual Visits (Telemedicine).  Patients are able to view lab/test results, encounter notes, upcoming appointments, etc.  Non-urgent messages can be sent to your provider as well.   To learn more about what you can do with MyChart, go to [email or web address].    Your next appointment:   1) 8 week(s) - with Ignacia Bayley, NP  2) 4 months- with Dr. Caryl Comes  The format for your next appointment:   In Person  Provider:   as above   Other Instructions n/a

## 2020-08-08 NOTE — Progress Notes (Signed)
Patient Care Team: Dana Haven, MD as PCP - General (Family Medicine) Dana Sprang, MD as PCP - Cardiology (Cardiology) Dana Haven, MD (Family Medicine)   HPI  [name] is a 80 y.o. female Seen in follow-up for recent hospitalization having presented with acute shortness of breath and found to be in rapid atrial fibrillation.  Rate control was accomplished with beta-blockers and calcium blockers and anticoagulation with Xarelto; now on BB alone  She has had recurrent atrial fib with RVR  Underwen tDCCV with rapid reversion  Started flecainide 5/19   Seen in ER following a fall while Inebriated ( ER BAL >200)  Discussed with [name]--drinking at least 2 ounces of vodka at night and some wine.  There have been concerns from the children about drinking too much.  Feels doted upon by her grandchildren and appreciated by her great-grandchildren  Underwent repeat cardioversion 12/21 on flecainide.  Unfortunately, she reverted to atrial fibrillation.  Flecainide dose was increased.  Cardioversion recommended but she had missed a dose of Pradaxa.  She comes in today unaware of her rhythm.  She does describe a recent fall.  These happen every number of weeks, mostly she is able to get herself up off the floor.  Most recently, she fell, with the help of her neighbor she was able to stand and then fell again.  She has some orthostatic lightheadedness.  Per discussion with [name] working on decreasing alcohol intake  Her son has reached back out to her.  Her episodically returning prodigal.   noted to have chronic anemia    DATE PR interval QRSduration Dose-Flec  5/19  NA 78 0  6/19 210 84 50  9/19 210 90 50  10/20 216 92 50  5/21 226 94 50       \]     DATE TEST EF   2/19 Echo   65 % LAE (48/2.8/46)  9/21 Echo  60-65%     Date Cr K Hgb  2/19 0.8 3.9 12.5  5/19  1.12 3.8   6/20 0.97 4.4 12.4  12/20 0.9  (CE) 2/21 4.2  10.6  9/21 1.11<<1.7 5.0  69.4<<8.5    Thromboembolic risk factors ( age  -2, HTN-1, Vasc disease -1, Gender-1) for a CHADSVASc Score of 5       She is [name]' s wife.     Past Surgical History:  Procedure Laterality Date  . ABDOMINAL HYSTERECTOMY    . ANKLE FRACTURE SURGERY  4/08   left---hardware still in place  . 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 BREAST EXCISIONAL BIOPSY Left yrs ago   benign  . BREAST LUMPECTOMY Left 2001   f/u radiation  . CARDIOVERSION N/A 10/04/2017   Procedure: CARDIOVERSION;  Surgeon: Dana Hampshire, MD;  Location: [facility];  Service: Cardiovascular;  Laterality: N/A;  . CARDIOVERSION N/A 12/16/2017   Procedure: CARDIOVERSION;  Surgeon: Dana Sprang, MD;  Location: [facility];  Service: Cardiovascular;  Laterality: N/A;  . CARDIOVERSION N/A 06/24/2020   Procedure: CARDIOVERSION;  Surgeon: Dana Hampshire, MD;  Location: [facility];  Service: Cardiovascular;  Laterality: N/A;  . CAROTID ENDARTERECTOMY Left 10/23/2004  . FRACTURE SURGERY    . OOPHORECTOMY    . SHOULDER SURGERY  6/07   left  . TONSILLECTOMY AND ADENOIDECTOMY    . TOTAL HIP ARTHROPLASTY  2004   right    Current Outpatient Medications  Medication Sig  Dispense Refill  . allopurinol (ZYLOPRIM) 100 MG tablet Take 100 mg by mouth daily.    . Cholecalciferol (VITAMIN D3) 1000 units CAPS Take 1 capsule by mouth 2 (two) times daily.     . flecainide (TAMBOCOR) 50 MG tablet Take 1.5 tablets (75 mg total) by mouth 2 (two) times daily. 270 tablet 0  . furosemide (LASIX) 40 MG tablet Take 20 mg by mouth daily.    Marland Kitchen LORazepam (ATIVAN) 0.5 MG tablet TAKE 1 TABLET BY MOUTH TWICE A DAY AS NEEDED FOR ANXIETY 60 tablet 0  . losartan (COZAAR) 50 MG tablet Take 1 tablet (50 mg total) by mouth daily. 90 tablet 3  . metoprolol tartrate (LOPRESSOR) 50 MG tablet TAKE 1/2 TABLET (25 MG) BY MOUTH TWICE DAILY 90 tablet 3  . mirtazapine (REMERON) 15 MG tablet TAKE 1 TABLET BY MOUTH EVERYDAY AT BEDTIME 90 tablet 3  . Multiple  Vitamin (MULTIVITAMIN) capsule Take 1 capsule by mouth daily.    . Multiple Vitamins-Minerals (PRESERVISION AREDS 2+MULTI VIT PO) Take 1 capsule by mouth in the morning and at bedtime.    Marland Kitchen PRADAXA 150 MG CAPS capsule TAKE 1 CAPSULE BY MOUTH TWICE A DAY 180 capsule 1   No current facility-administered medications for this visit.    Allergies  Allergen Reactions  . Adhesive [Tape] Other (See Comments)    "Does not work well with me"      Review of Systems negative except from HPI and PMH  Physical Exam BP 90/70 (BP Location: Left Arm, Patient Position: Sitting, Cuff Size: Normal)   Pulse 83   Ht 5' 6.5" (1.689 m)   Wt 114 lb 8 oz (51.9 kg)   SpO2 95%   BMI 18.20 kg/m  Well developed and nourished in no acute distress HENT normal Neck supple with JVP-  flat   Clear Regular rate and rhythm, no murmurs or gallops Abd-soft with active BS No Clubbing cyanosis edema Skin-warm and dry A & Oriented  Grossly normal sensory and motor function  ECG   Multiple ECGs were obtained As I sought to clarify the rhythm.  Ultimately I think it is sinus with first-degree AV block with P waves most apparent on post relatively short RR intervals and then encroachment into the T wave.  There is a sharp spike noted on the T wave of the lateral precordium which I initially thought was a P wave, it was however linked to the QRS that preceded which brought this into question and ultimately I think is part of the T wave Assessment and  Plan  Atrial fibrillation-persistent with a rapid rate  Sinus brady  Alcohol use/ (As above)   Anemia   Hypertension   Orthostatic hypotension  Arthritis-psoriatic  Peripheral vascular disease carotid artery occlusion  No interval palpitations.  She has reverted from her atrial arrhythmia to what I think (see above) is sinus rhythm with first-degree AV block.  We will continue the flecainide although it may be necessary to decrease the dose given the  lengthening of her PR interval.  Her falls are concerning.  The day that she fell noted above followed a brunch.  It was a dry brunch.  Orthostatic vital signs today were illuminating as well as her resting blood pressure.  We will discontinue her losartan and allow her systolic blood pressure to move into the 150s-170s so as to hopefully obviate further falls.  Encouraged her to continue to decrease her alcohol intake.  On Anticoagulation;  No bleeding  issues

## 2020-08-12 ENCOUNTER — Encounter: Payer: Self-pay | Admitting: Podiatry

## 2020-08-12 ENCOUNTER — Other Ambulatory Visit: Payer: Self-pay

## 2020-08-12 ENCOUNTER — Ambulatory Visit (INDEPENDENT_AMBULATORY_CARE_PROVIDER_SITE_OTHER): Payer: Medicare Other | Admitting: Podiatry

## 2020-08-12 DIAGNOSIS — D689 Coagulation defect, unspecified: Secondary | ICD-10-CM | POA: Insufficient documentation

## 2020-08-12 DIAGNOSIS — I872 Venous insufficiency (chronic) (peripheral): Secondary | ICD-10-CM

## 2020-08-12 DIAGNOSIS — B351 Tinea unguium: Secondary | ICD-10-CM | POA: Diagnosis not present

## 2020-08-12 DIAGNOSIS — M79676 Pain in unspecified toe(s): Secondary | ICD-10-CM | POA: Diagnosis not present

## 2020-08-12 NOTE — Progress Notes (Signed)
This patient returns to my office for at risk foot care.  This patient requires this care by a professional since this patient will be at risk due to having lymphedema.  This patient is unable to cut nails herself since the patient cannot reach her nails.These nails are painful walking and wearing shoes.  This patient presents for at risk foot care today.  General Appearance  Alert, conversant and in no acute stress.  Vascular  Dorsalis pedis and posterior tibial  pulses are palpable  bilaterally.  Capillary return is within normal limits  bilaterally. Temperature is within normal limits  bilaterally.  Neurologic  Senn-Weinstein monofilament wire test within normal limits  bilaterally. Muscle power within normal limits bilaterally.  Nails Thick disfigured discolored nails with subungual debris  from hallux to fifth toes bilaterally. No evidence of bacterial infection or drainage bilaterally.  Orthopedic  No limitations of motion  feet .  No crepitus or effusions noted.  No bony pathology or digital deformities noted.  HAV  B/L.  Mallet toe third toe left foot.  Skin  normotropic skin with no porokeratosis noted bilaterally.  No signs of infections or ulcers noted.     Onychomycosis  Pain in right toes  Pain in left toes  Consent was obtained for treatment procedures.   Mechanical debridement of nails 1-5  bilaterally performed with a nail nipper.  Filed with dremel without incident.  Dispense crest pad.   Return office visit    3 months                  Told patient to return for periodic foot care and evaluation due to potential at risk complications.   Gardiner Barefoot DPM

## 2020-08-27 DIAGNOSIS — Z20822 Contact with and (suspected) exposure to covid-19: Secondary | ICD-10-CM | POA: Diagnosis not present

## 2020-08-27 DIAGNOSIS — Z03818 Encounter for observation for suspected exposure to other biological agents ruled out: Secondary | ICD-10-CM | POA: Diagnosis not present

## 2020-09-03 ENCOUNTER — Other Ambulatory Visit: Payer: Self-pay | Admitting: Internal Medicine

## 2020-09-03 ENCOUNTER — Other Ambulatory Visit: Payer: Self-pay | Admitting: Family Medicine

## 2020-09-04 NOTE — Telephone Encounter (Signed)
This is a [facility] pt 

## 2020-09-05 ENCOUNTER — Other Ambulatory Visit: Payer: Self-pay

## 2020-09-05 ENCOUNTER — Other Ambulatory Visit (INDEPENDENT_AMBULATORY_CARE_PROVIDER_SITE_OTHER): Payer: Medicare Other

## 2020-09-05 ENCOUNTER — Ambulatory Visit (INDEPENDENT_AMBULATORY_CARE_PROVIDER_SITE_OTHER): Payer: Medicare Other | Admitting: Family Medicine

## 2020-09-05 ENCOUNTER — Encounter: Payer: Self-pay | Admitting: Family Medicine

## 2020-09-05 VITALS — BP 118/80 | HR 66 | Temp 98.3°F | Ht 66.5 in | Wt 116.2 lb

## 2020-09-05 DIAGNOSIS — F320 Major depressive disorder, single episode, mild: Secondary | ICD-10-CM | POA: Diagnosis not present

## 2020-09-05 DIAGNOSIS — I4819 Other persistent atrial fibrillation: Secondary | ICD-10-CM

## 2020-09-05 DIAGNOSIS — L299 Pruritus, unspecified: Secondary | ICD-10-CM | POA: Diagnosis not present

## 2020-09-05 NOTE — Patient Instructions (Signed)
Nice to see you. We will get labs today. Please see your eye doctor as planned.

## 2020-09-05 NOTE — Assessment & Plan Note (Signed)
Asymptomatic.  For now she will remain on Remeron though we may taper her off of this given her other symptoms.

## 2020-09-05 NOTE — Progress Notes (Signed)
[name], MD Phone: [phone]  [name] is a 80 y.o. female who presents today for f/u.  Atrial fibrillation: Taking flecainide, Pradaxa, and metoprolol.  No palpitations.  No bleeding issues.  Has been taking Lasix once daily as well.  Depression: She came off of the Remeron and notes that she had recurrence of depressive symptoms so she started back on it about 6 weeks ago.  She notes no depression currently.  It has been beneficial.  Over the last 3 weeks the patient notes dry mouth particularly at night that causes her to get up multiple times to get water.  She notes her eyes have been matted shut as well.  She notes itching from her waist up though no significant rash.  She does have a history of psoriasis though does not follow with dermatology.  She notes more blurry vision as well.   Social History   Tobacco Use  Smoking Status Former Smoker  . Packs/day: 1.00  . Years: 40.00  . Pack years: 40.00  . Types: Cigarettes  . Quit date: 07/21/1995  . Years since quitting: 25.1  Smokeless Tobacco Never Used    Current Outpatient Medications on File Prior to Visit  Medication Sig Dispense Refill  . allopurinol (ZYLOPRIM) 100 MG tablet Take 1 tablet (100 mg total) by mouth daily. 30 tablet 0  . Cholecalciferol (VITAMIN D3) 1000 units CAPS Take 1 capsule by mouth 2 (two) times daily.     . flecainide (TAMBOCOR) 50 MG tablet Take 1.5 tablets (75 mg total) by mouth 2 (two) times daily. 270 tablet 0  . furosemide (LASIX) 40 MG tablet Take 20 mg by mouth daily.    Marland Kitchen LORazepam (ATIVAN) 0.5 MG tablet TAKE 1 TABLET BY MOUTH TWICE A DAY AS NEEDED FOR ANXIETY 60 tablet 0  . metoprolol tartrate (LOPRESSOR) 50 MG tablet TAKE 1/2 TABLET (25 MG) BY MOUTH TWICE DAILY 90 tablet 3  . mirtazapine (REMERON) 15 MG tablet TAKE 1 TABLET BY MOUTH EVERYDAY AT BEDTIME 90 tablet 3  . Multiple Vitamin (MULTIVITAMIN) capsule Take 1 capsule by mouth daily.    . Multiple Vitamins-Minerals  (PRESERVISION AREDS 2+MULTI VIT PO) Take 1 capsule by mouth in the morning and at bedtime.    Marland Kitchen PRADAXA 150 MG CAPS capsule TAKE 1 CAPSULE BY MOUTH TWICE A DAY 180 capsule 1   No current facility-administered medications on file prior to visit.     ROS see history of present illness  Objective  Physical Exam Vitals:   09/05/20 1048  BP: 118/80  Pulse: 66  Temp: 98.3 F (36.8 C)  SpO2: 92%    BP Readings from Last 3 Encounters:  09/05/20 118/80  08/08/20 90/70  07/25/20 (!) 152/110   Wt Readings from Last 3 Encounters:  09/05/20 116 lb 3.2 oz (52.7 kg)  08/08/20 114 lb 8 oz (51.9 kg)  07/25/20 113 lb (51.3 kg)    Physical Exam Constitutional:      General: She is not in acute distress.    Appearance: She is not diaphoretic.  Eyes:     Conjunctiva/sclera: Conjunctivae normal.     Pupils: Pupils are equal, round, and reactive to light.  Cardiovascular:     Rate and Rhythm: Normal rate and regular rhythm.     Heart sounds: Normal heart sounds.  Pulmonary:     Effort: Pulmonary effort is normal.     Breath sounds: Normal breath sounds.  Musculoskeletal:        General:  No edema.  Skin:    General: Skin is warm and dry.     Comments: No scalp rash, no rash on her posterior neck and upper middle back  Neurological:     Mental Status: She is alert.    Vision screening without glasses Bilateral 20/30 Left 20/30 Right 20/30   Assessment/Plan: Please see individual problem list.  Problem List Items Addressed This Visit    Atrial fibrillation (HCC)    Sinus rhythm today.  She will continue on Pradaxa for reduction of stroke risk.  She will continue her other medicines through her cardiologist.      Depression, major, single episode, mild ([place])    Asymptomatic.  For now she will remain on Remeron though we may taper her off of this given her other symptoms.      Itching - Primary    Patient with itching without rash, dry mouth, and eyes matting shut at  night.  No symptoms during the day.  Will check CMP to evaluate liver function and bilirubin levels.  We will also check renal function to see if she is dehydrated.  Discussed that medication could be playing a role and we may have to alter her current medication regimen.  Also discussed the potential for seeing dermatology if no cause is found.  She will also keep her eye doctor appointment.      Relevant Orders   CBC w/Diff   Comp Met (CMET)      This visit occurred during the SARS-CoV-2 public health emergency.  Safety protocols were in place, including screening questions prior to the visit, additional usage of staff PPE, and extensive cleaning of exam room while observing appropriate contact time as indicated for disinfecting solutions.    [name], MD [place]

## 2020-09-05 NOTE — Assessment & Plan Note (Signed)
Sinus rhythm today.  She will continue on Pradaxa for reduction of stroke risk.  She will continue her other medicines through her cardiologist.

## 2020-09-05 NOTE — Assessment & Plan Note (Addendum)
Patient with itching without rash, dry mouth, and eyes matting shut at night.  No symptoms during the day.  Will check CMP to evaluate liver function and bilirubin levels.  We will also check renal function to see if she is dehydrated.  Discussed that medication could be playing a role and we may have to alter her current medication regimen.  Also discussed the potential for seeing dermatology if no cause is found.  She will also keep her eye doctor appointment.

## 2020-09-06 ENCOUNTER — Other Ambulatory Visit: Payer: Self-pay | Admitting: Family Medicine

## 2020-09-06 LAB — CBC WITH DIFFERENTIAL/PLATELET
Basophils Absolute: 0.1 10*3/uL (ref 0.0–0.1)
Basophils Relative: 1.2 % (ref 0.0–3.0)
Eosinophils Absolute: 0.1 10*3/uL (ref 0.0–0.7)
Eosinophils Relative: 1.6 % (ref 0.0–5.0)
HCT: 35.1 % — ABNORMAL LOW (ref 36.0–46.0)
Hemoglobin: 11.9 g/dL — ABNORMAL LOW (ref 12.0–15.0)
Lymphocytes Relative: 29.8 % (ref 12.0–46.0)
Lymphs Abs: 2 10*3/uL (ref 0.7–4.0)
MCHC: 33.9 g/dL (ref 30.0–36.0)
MCV: 111.2 fl — ABNORMAL HIGH (ref 78.0–100.0)
Monocytes Absolute: 1 10*3/uL (ref 0.1–1.0)
Monocytes Relative: 14.5 % — ABNORMAL HIGH (ref 3.0–12.0)
Neutro Abs: 3.6 10*3/uL (ref 1.4–7.7)
Neutrophils Relative %: 52.9 % (ref 43.0–77.0)
Platelets: 247 10*3/uL (ref 150.0–400.0)
RBC: 3.16 Mil/uL — ABNORMAL LOW (ref 3.87–5.11)
RDW: 14.6 % (ref 11.5–15.5)
WBC: 6.8 10*3/uL (ref 4.0–10.5)

## 2020-09-06 LAB — COMPREHENSIVE METABOLIC PANEL
ALT: 11 U/L (ref 0–35)
AST: 19 U/L (ref 0–37)
Albumin: 4.1 g/dL (ref 3.5–5.2)
Alkaline Phosphatase: 75 U/L (ref 39–117)
BUN: 44 mg/dL — ABNORMAL HIGH (ref 6–23)
CO2: 33 mEq/L — ABNORMAL HIGH (ref 19–32)
Calcium: 11.4 mg/dL — ABNORMAL HIGH (ref 8.4–10.5)
Chloride: 94 mEq/L — ABNORMAL LOW (ref 96–112)
Creatinine, Ser: 1.77 mg/dL — ABNORMAL HIGH (ref 0.40–1.20)
GFR: 26.64 mL/min — ABNORMAL LOW (ref >60.00)
Glucose, Bld: 107 mg/dL — ABNORMAL HIGH (ref 70–99)
Potassium: 3.4 mEq/L — ABNORMAL LOW (ref 3.5–5.1)
Sodium: 142 mEq/L (ref 135–145)
Total Bilirubin: 0.5 mg/dL (ref 0.2–1.2)
Total Protein: 6.9 g/dL (ref 6.0–8.3)

## 2020-09-11 ENCOUNTER — Other Ambulatory Visit: Payer: Self-pay

## 2020-09-11 ENCOUNTER — Other Ambulatory Visit (INDEPENDENT_AMBULATORY_CARE_PROVIDER_SITE_OTHER): Payer: Medicare Other

## 2020-09-11 DIAGNOSIS — D649 Anemia, unspecified: Secondary | ICD-10-CM | POA: Diagnosis not present

## 2020-09-11 LAB — BASIC METABOLIC PANEL
BUN: 34 mg/dL — ABNORMAL HIGH (ref 6–23)
CO2: 31 mEq/L (ref 19–32)
Calcium: 11.3 mg/dL — ABNORMAL HIGH (ref 8.4–10.5)
Chloride: 102 mEq/L (ref 96–112)
Creatinine, Ser: 1.35 mg/dL — ABNORMAL HIGH (ref 0.40–1.20)
GFR: 36.86 mL/min — ABNORMAL LOW (ref >60.00)
Glucose, Bld: 96 mg/dL (ref 70–99)
Potassium: 4.2 mEq/L (ref 3.5–5.1)
Sodium: 143 mEq/L (ref 135–145)

## 2020-09-11 LAB — IRON,TIBC AND FERRITIN PANEL
%SAT: 43 % (calc) (ref 16–45)
Ferritin: 71 ng/mL (ref 16–288)
Iron: 141 ug/dL (ref 45–160)
TIBC: 329 mcg/dL (calc) (ref 250–450)

## 2020-09-12 ENCOUNTER — Other Ambulatory Visit: Payer: Self-pay | Admitting: Family Medicine

## 2020-09-13 DIAGNOSIS — H353132 Nonexudative age-related macular degeneration, bilateral, intermediate dry stage: Secondary | ICD-10-CM | POA: Diagnosis not present

## 2020-09-18 ENCOUNTER — Emergency Department: Payer: Medicare Other

## 2020-09-18 ENCOUNTER — Inpatient Hospital Stay
Admission: EM | Admit: 2020-09-18 | Discharge: 2020-09-19 | DRG: 308 | Disposition: A | Discharge status: Other Acute Inpatient Hospital | Payer: Medicare Other | Attending: Internal Medicine | Admitting: Internal Medicine

## 2020-09-18 ENCOUNTER — Other Ambulatory Visit: Payer: Self-pay

## 2020-09-18 DIAGNOSIS — I5033 Acute on chronic diastolic (congestive) heart failure: Secondary | ICD-10-CM | POA: Diagnosis present

## 2020-09-18 DIAGNOSIS — I440 Atrioventricular block, first degree: Secondary | ICD-10-CM | POA: Diagnosis present

## 2020-09-18 DIAGNOSIS — Z8249 Family history of ischemic heart disease and other diseases of the circulatory system: Secondary | ICD-10-CM

## 2020-09-18 DIAGNOSIS — Z833 Family history of diabetes mellitus: Secondary | ICD-10-CM

## 2020-09-18 DIAGNOSIS — I480 Paroxysmal atrial fibrillation: Secondary | ICD-10-CM

## 2020-09-18 DIAGNOSIS — Z923 Personal history of irradiation: Secondary | ICD-10-CM | POA: Diagnosis not present

## 2020-09-18 DIAGNOSIS — I959 Hypotension, unspecified: Secondary | ICD-10-CM | POA: Diagnosis present

## 2020-09-18 DIAGNOSIS — I4819 Other persistent atrial fibrillation: Secondary | ICD-10-CM | POA: Diagnosis present

## 2020-09-18 DIAGNOSIS — F419 Anxiety disorder, unspecified: Secondary | ICD-10-CM | POA: Diagnosis present

## 2020-09-18 DIAGNOSIS — I110 Hypertensive heart disease with heart failure: Secondary | ICD-10-CM | POA: Diagnosis not present

## 2020-09-18 DIAGNOSIS — N1832 Chronic kidney disease, stage 3b: Secondary | ICD-10-CM | POA: Diagnosis not present

## 2020-09-18 DIAGNOSIS — J9601 Acute respiratory failure with hypoxia: Secondary | ICD-10-CM | POA: Diagnosis present

## 2020-09-18 DIAGNOSIS — D631 Anemia in chronic kidney disease: Secondary | ICD-10-CM | POA: Diagnosis present

## 2020-09-18 DIAGNOSIS — E785 Hyperlipidemia, unspecified: Secondary | ICD-10-CM | POA: Diagnosis present

## 2020-09-18 DIAGNOSIS — N183 Chronic kidney disease, stage 3 unspecified: Secondary | ICD-10-CM | POA: Diagnosis present

## 2020-09-18 DIAGNOSIS — I10 Essential (primary) hypertension: Secondary | ICD-10-CM | POA: Diagnosis not present

## 2020-09-18 DIAGNOSIS — F1010 Alcohol abuse, uncomplicated: Secondary | ICD-10-CM | POA: Diagnosis present

## 2020-09-18 DIAGNOSIS — I472 Ventricular tachycardia, unspecified: Secondary | ICD-10-CM

## 2020-09-18 DIAGNOSIS — Z96641 Presence of right artificial hip joint: Secondary | ICD-10-CM | POA: Diagnosis present

## 2020-09-18 DIAGNOSIS — F32A Depression, unspecified: Secondary | ICD-10-CM | POA: Diagnosis present

## 2020-09-18 DIAGNOSIS — R778 Other specified abnormalities of plasma proteins: Secondary | ICD-10-CM | POA: Diagnosis not present

## 2020-09-18 DIAGNOSIS — I509 Heart failure, unspecified: Secondary | ICD-10-CM

## 2020-09-18 DIAGNOSIS — R0602 Shortness of breath: Secondary | ICD-10-CM | POA: Diagnosis not present

## 2020-09-18 DIAGNOSIS — I5031 Acute diastolic (congestive) heart failure: Secondary | ICD-10-CM | POA: Diagnosis not present

## 2020-09-18 DIAGNOSIS — Z7902 Long term (current) use of antithrombotics/antiplatelets: Secondary | ICD-10-CM | POA: Diagnosis not present

## 2020-09-18 DIAGNOSIS — Z79899 Other long term (current) drug therapy: Secondary | ICD-10-CM | POA: Diagnosis not present

## 2020-09-18 DIAGNOSIS — R0789 Other chest pain: Secondary | ICD-10-CM | POA: Diagnosis not present

## 2020-09-18 DIAGNOSIS — Z20822 Contact with and (suspected) exposure to covid-19: Secondary | ICD-10-CM | POA: Diagnosis present

## 2020-09-18 DIAGNOSIS — Z91048 Other nonmedicinal substance allergy status: Secondary | ICD-10-CM

## 2020-09-18 DIAGNOSIS — I517 Cardiomegaly: Secondary | ICD-10-CM | POA: Diagnosis not present

## 2020-09-18 DIAGNOSIS — K219 Gastro-esophageal reflux disease without esophagitis: Secondary | ICD-10-CM | POA: Diagnosis present

## 2020-09-18 DIAGNOSIS — Z66 Do not resuscitate: Secondary | ICD-10-CM | POA: Diagnosis present

## 2020-09-18 DIAGNOSIS — Z853 Personal history of malignant neoplasm of breast: Secondary | ICD-10-CM | POA: Diagnosis not present

## 2020-09-18 DIAGNOSIS — Z87891 Personal history of nicotine dependence: Secondary | ICD-10-CM | POA: Diagnosis not present

## 2020-09-18 DIAGNOSIS — R0902 Hypoxemia: Secondary | ICD-10-CM | POA: Diagnosis not present

## 2020-09-18 DIAGNOSIS — I499 Cardiac arrhythmia, unspecified: Secondary | ICD-10-CM | POA: Diagnosis present

## 2020-09-18 DIAGNOSIS — I4891 Unspecified atrial fibrillation: Secondary | ICD-10-CM | POA: Diagnosis present

## 2020-09-18 DIAGNOSIS — I248 Other forms of acute ischemic heart disease: Secondary | ICD-10-CM | POA: Diagnosis present

## 2020-09-18 DIAGNOSIS — D638 Anemia in other chronic diseases classified elsewhere: Secondary | ICD-10-CM | POA: Diagnosis present

## 2020-09-18 DIAGNOSIS — R296 Repeated falls: Secondary | ICD-10-CM | POA: Diagnosis present

## 2020-09-18 DIAGNOSIS — J90 Pleural effusion, not elsewhere classified: Secondary | ICD-10-CM | POA: Diagnosis not present

## 2020-09-18 DIAGNOSIS — I5021 Acute systolic (congestive) heart failure: Secondary | ICD-10-CM | POA: Diagnosis not present

## 2020-09-18 DIAGNOSIS — R069 Unspecified abnormalities of breathing: Secondary | ICD-10-CM | POA: Diagnosis not present

## 2020-09-18 DIAGNOSIS — I495 Sick sinus syndrome: Principal | ICD-10-CM | POA: Diagnosis present

## 2020-09-18 DIAGNOSIS — R001 Bradycardia, unspecified: Secondary | ICD-10-CM | POA: Diagnosis not present

## 2020-09-18 DIAGNOSIS — R079 Chest pain, unspecified: Secondary | ICD-10-CM | POA: Diagnosis not present

## 2020-09-18 DIAGNOSIS — I130 Hypertensive heart and chronic kidney disease with heart failure and stage 1 through stage 4 chronic kidney disease, or unspecified chronic kidney disease: Secondary | ICD-10-CM | POA: Diagnosis present

## 2020-09-18 DIAGNOSIS — R7989 Other specified abnormal findings of blood chemistry: Secondary | ICD-10-CM | POA: Diagnosis present

## 2020-09-18 DIAGNOSIS — E860 Dehydration: Secondary | ICD-10-CM | POA: Diagnosis present

## 2020-09-18 HISTORY — DX: Heart failure, unspecified: I50.9

## 2020-09-18 LAB — COMPREHENSIVE METABOLIC PANEL
ALT: 26 U/L (ref 0–44)
AST: 46 U/L — ABNORMAL HIGH (ref 15–41)
Albumin: 3.7 g/dL (ref 3.5–5.0)
Alkaline Phosphatase: 100 U/L (ref 38–126)
Anion gap: 11 (ref 5–15)
BUN: 27 mg/dL — ABNORMAL HIGH (ref 8–23)
CO2: 19 mmol/L — ABNORMAL LOW (ref 22–32)
Calcium: 8.6 mg/dL — ABNORMAL LOW (ref 8.9–10.3)
Chloride: 110 mmol/L (ref 98–111)
Creatinine, Ser: 1.01 mg/dL — ABNORMAL HIGH (ref 0.44–1.00)
GFR, Estimated: 56 mL/min — ABNORMAL LOW (ref >60)
Glucose, Bld: 86 mg/dL (ref 70–99)
Potassium: 4.8 mmol/L (ref 3.5–5.1)
Sodium: 140 mmol/L (ref 135–145)
Total Bilirubin: 1 mg/dL (ref 0.3–1.2)
Total Protein: 6.7 g/dL (ref 6.5–8.1)

## 2020-09-18 LAB — CBC
HCT: 32.7 % — ABNORMAL LOW (ref 36.0–46.0)
Hemoglobin: 10.8 g/dL — ABNORMAL LOW (ref 12.0–15.0)
MCH: 36.6 pg — ABNORMAL HIGH (ref 26.0–34.0)
MCHC: 33 g/dL (ref 30.0–36.0)
MCV: 110.8 fL — ABNORMAL HIGH (ref 80.0–100.0)
Platelets: 249 10*3/uL (ref 150–400)
RBC: 2.95 MIL/uL — ABNORMAL LOW (ref 3.87–5.11)
RDW: 13.4 % (ref 11.5–15.5)
WBC: 9.5 10*3/uL (ref 4.0–10.5)
nRBC: 0 % (ref 0.0–0.2)

## 2020-09-18 LAB — TROPONIN I (HIGH SENSITIVITY)
Troponin I (High Sensitivity): 13 ng/L (ref <18)
Troponin I (High Sensitivity): 57 ng/L — ABNORMAL HIGH (ref <18)
Troponin I (High Sensitivity): 90 ng/L — ABNORMAL HIGH (ref <18)

## 2020-09-18 LAB — BRAIN NATRIURETIC PEPTIDE: B Natriuretic Peptide: 526.8 pg/mL — ABNORMAL HIGH (ref 0.0–100.0)

## 2020-09-18 LAB — PROTIME-INR
INR: 1.5 — ABNORMAL HIGH (ref 0.8–1.2)
Prothrombin Time: 17.5 seconds — ABNORMAL HIGH (ref 11.4–15.2)

## 2020-09-18 LAB — APTT: aPTT: 55 seconds — ABNORMAL HIGH (ref 24–36)

## 2020-09-18 MED ORDER — FLECAINIDE ACETATE 50 MG PO TABS
75.0000 mg | ORAL_TABLET | Freq: Two times a day (BID) | ORAL | Status: DC
  Administered 2020-09-18 – 2020-09-19 (×3): 75 mg via ORAL
  Filled 2020-09-18 (×4): qty 2

## 2020-09-18 MED ORDER — SODIUM CHLORIDE 0.9% FLUSH
3.0000 mL | Freq: Two times a day (BID) | INTRAVENOUS | Status: DC
  Administered 2020-09-18 – 2020-09-19 (×3): 3 mL via INTRAVENOUS

## 2020-09-18 MED ORDER — DABIGATRAN ETEXILATE MESYLATE 150 MG PO CAPS
150.0000 mg | ORAL_CAPSULE | Freq: Two times a day (BID) | ORAL | Status: DC
  Administered 2020-09-18 – 2020-09-19 (×2): 150 mg via ORAL
  Filled 2020-09-18 (×3): qty 1

## 2020-09-18 MED ORDER — ALLOPURINOL 100 MG PO TABS
100.0000 mg | ORAL_TABLET | Freq: Every day | ORAL | Status: DC
  Administered 2020-09-18 – 2020-09-19 (×2): 100 mg via ORAL
  Filled 2020-09-18 (×2): qty 1

## 2020-09-18 MED ORDER — SODIUM CHLORIDE 0.9 % IV SOLN
250.0000 mL | INTRAVENOUS | Status: DC | PRN
  Administered 2020-09-19: 250 mL via INTRAVENOUS

## 2020-09-18 MED ORDER — LORAZEPAM 0.5 MG PO TABS: 0.5000 mg | ORAL_TABLET | Freq: Two times a day (BID) | ORAL | Status: DC | PRN

## 2020-09-18 MED ORDER — ACETAMINOPHEN 325 MG PO TABS
650.0000 mg | ORAL_TABLET | ORAL | Status: DC | PRN
  Administered 2020-09-19: 650 mg via ORAL
  Filled 2020-09-18: qty 2

## 2020-09-18 MED ORDER — SODIUM CHLORIDE 0.9% FLUSH: 3.0000 mL | INTRAVENOUS | Status: DC | PRN

## 2020-09-18 MED ORDER — ENALAPRILAT 1.25 MG/ML IV SOLN
0.6250 mg | Freq: Once | INTRAVENOUS | Status: AC
  Administered 2020-09-18: 0.625 mg via INTRAVENOUS
  Filled 2020-09-18: qty 2

## 2020-09-18 MED ORDER — FUROSEMIDE 10 MG/ML IJ SOLN
40.0000 mg | Freq: Once | INTRAMUSCULAR | Status: AC
  Administered 2020-09-18: 40 mg via INTRAVENOUS
  Filled 2020-09-18: qty 4

## 2020-09-18 MED ORDER — MIRTAZAPINE 15 MG PO TABS
15.0000 mg | ORAL_TABLET | Freq: Every day | ORAL | Status: DC
  Administered 2020-09-18: 15 mg via ORAL
  Filled 2020-09-18: qty 1

## 2020-09-18 MED ORDER — FUROSEMIDE 10 MG/ML IJ SOLN
40.0000 mg | Freq: Two times a day (BID) | INTRAMUSCULAR | Status: DC
  Administered 2020-09-18: 40 mg via INTRAVENOUS
  Filled 2020-09-18: qty 4

## 2020-09-18 MED ORDER — ONDANSETRON HCL 4 MG/2ML IJ SOLN
4.0000 mg | Freq: Four times a day (QID) | INTRAMUSCULAR | Status: DC | PRN
  Administered 2020-09-19 (×2): 4 mg via INTRAVENOUS
  Filled 2020-09-18 (×2): qty 2

## 2020-09-18 MED ORDER — METOPROLOL TARTRATE 25 MG PO TABS
25.0000 mg | ORAL_TABLET | Freq: Two times a day (BID) | ORAL | Status: DC
  Administered 2020-09-18 (×2): 25 mg via ORAL
  Filled 2020-09-18 (×2): qty 1

## 2020-09-18 MED ORDER — VITAMIN D3 25 MCG (1000 UNIT) PO TABS
2000.0000 [IU] | ORAL_TABLET | Freq: Every day | ORAL | Status: DC
  Administered 2020-09-19: 2000 [IU] via ORAL
  Filled 2020-09-18 (×2): qty 2

## 2020-09-18 NOTE — ED Notes (Signed)
Report off to mary rn

## 2020-09-18 NOTE — H&P (Addendum)
History and Physical    [name] ZOX:[id number] DOB: 04/25/1941 DOA: 09/18/2020  PCP: [name], MD   Patient coming from: Home  I have personally briefly reviewed patient's old medical records in [place]  Chief Complaint: Shortness of breath  HPI: [name] is a 80 y.o. female with medical history significant for chronic diastolic dysfunction CHF, history of left breast cancer status post radiation therapy, paroxysmal atrial fibrillation, hypertension, dyslipidemia and GERD who presents to the ER via EMS for evaluation of progressively worsening shortness of breath over the last 5 days but worse on the morning of her admission.  Patient states that she has had shortness of breath with exertion but over the last 2 days she is now short of breath at rest.  Per EMS she had room air pulse oximetry of 82% and was placed on 4 L of oxygen with improvement in her pulse oximetry to 95%.  She also received aspirin 325 mg in the field as well as an inch of Nitropaste.  Patient admits to a 10 pound weight gain in the last 1 week. Per patient her Lasix was recently discontinued because of complaints of dry mouth and itching at night and this was thought to be related to her diuretic therapy.  Since then she has had increased dry mouth and increased thirst and has been drinking a lot of 7-Up soda's. She complains of pain involving her right shoulder and extending to the right side of her neck but denies having any palpitations, no diaphoresis, no nausea, no vomiting, no orthopnea, no lower extremity swelling, no dizziness, no lightheadedness, no nocturia, no dysuria, no abdominal pain, no diarrhea, no constipation, no fever, no chills, no cough. Labs show sodium 140, potassium 4.8, chloride 110, bicarb 19, glucose 86, BUN 27, creatinine 1.01, calcium 8.6, alkaline phosphatase 100, albumin 3.7, AST 46 ALT 26 total protein 6.7, BNP 526, troponin 13 >> 57, white count 9.5,  hemoglobin 10.8, hematocrit 32.7, MCV 110, RDW 13.4, platelet count 249, PT 17.5, INR 1.5 Chest x-ray reviewed by me shows stable mild cardiomegaly. Interstitial opacity with Kerley lines and small pleural effusions.  Twelve-lead EKG reviewed by me shows sinus rhythm with prolonged PR   ED Course: Patient is an 80 year old female with a history of chronic diastolic dysfunction CHF who presents to the ER for evaluation of worsening shortness of breath.  Imaging is suggestive of CHF exacerbation and patient has elevated BNP levels.  Her diuretic was recently discontinued due to concerns for possible " allergic reaction".  She has had a 10 pound weight gain in the last 1 week.  She received a dose of Lasix in the ER as well as a dose of enalaprilat.  She will be admitted to the hospital for further evaluation.   Review of Systems: As per HPI otherwise all other systems reviewed and negative.    Past Medical History:  Diagnosis Date  . (HFpEF) heart failure with preserved ejection fraction ([place])    a. 08/2017 Echo: EF 55-60%, no rwma, mild MR, mildly dil LA, nl RV fxn; b. 03/2020 Echo: EF 60-65%, no rwma, Gr2 DD. RVSP 44.66mmHg. Mod dil LA. Mild MR.  Marland Kitchen Acute CHF (congestive heart failure) ([place]) 09/18/2020  . Breast cancer ([place]) 2001   left breast  . Cancer ([place]) 2001   left breast ca  . Carotid arterial disease ([place])    a. 10/2004 s/p L CEA; b. 12/2015 Carotid U/S: RICA 1-39%; b. LICA patent  CEA site.  . Closed right hip fracture ([place]) 09/20/2019  . Dysrhythmia   . GERD (gastroesophageal reflux disease)   . Hyperlipidemia   . Hypertension   . Osteoarthritis, multiple sites   . Osteopenia   . Persistent atrial fibrillation ([place])    a. Dx 08/2017; b. CHA2DS2VASc = 6-->Pradaxa; c. 09/2017 Successful DCCV (second shock - 200J); d. 10/2017 Recurrent Afib-->flecainide started 11/2017; e. 06/2020 s/p DCCV (200J x 1).  . Personal history of radiation therapy 2001   left breast ca  . Psoriasis     Past  Surgical History:  Procedure Laterality Date  . ABDOMINAL HYSTERECTOMY    . ANKLE FRACTURE SURGERY  4/08   left---hardware still in place  . 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 BREAST EXCISIONAL BIOPSY Left yrs ago   benign  . BREAST LUMPECTOMY Left 2001   f/u radiation  . CARDIOVERSION N/A 10/04/2017   Procedure: CARDIOVERSION;  Surgeon: Wellington Hampshire, MD;  Location: [facility];  Service: Cardiovascular;  Laterality: N/A;  . CARDIOVERSION N/A 12/16/2017   Procedure: CARDIOVERSION;  Surgeon: [name], MD;  Location: [facility];  Service: Cardiovascular;  Laterality: N/A;  . CARDIOVERSION N/A 06/24/2020   Procedure: CARDIOVERSION;  Surgeon: Wellington Hampshire, MD;  Location: [facility];  Service: Cardiovascular;  Laterality: N/A;  . CAROTID ENDARTERECTOMY Left 10/23/2004  . FRACTURE SURGERY    . OOPHORECTOMY    . SHOULDER SURGERY  6/07   left  . TONSILLECTOMY AND ADENOIDECTOMY    . TOTAL HIP ARTHROPLASTY  2004   right     reports that she quit smoking about 25 years ago. Her smoking use included cigarettes. She has a 40.00 pack-year smoking history. She has never used smokeless tobacco. She reports current alcohol use of about 16.0 standard drinks of alcohol per week. She reports that she does not use drugs.  Allergies  Allergen Reactions  . Adhesive [Tape] Other (See Comments)    "Does not work well with me"    Family History  Problem Relation Age of Onset  . Hypertension Brother   . Pneumonia Mother   . Leukemia Father   . Heart disease Neg Hx   . Diabetes Neg Hx   . Breast cancer Neg Hx       Prior to Admission medications   Medication Sig Start Date End Date Taking? Authorizing Provider  allopurinol (ZYLOPRIM) 100 MG tablet Take 1 tablet (100 mg total) by mouth daily. 08/08/20   [name], MD  Cholecalciferol (VITAMIN D3) 1000 units CAPS Take 1 capsule by mouth 2 (two) times daily.     [provider]  flecainide (TAMBOCOR) 50 MG tablet Take 1.5  tablets (75 mg total) by mouth 2 (two) times daily. 07/23/20   Theora Gianotti, NP  furosemide (LASIX) 40 MG tablet Take 20 mg by mouth daily.    [provider]  LORazepam (ATIVAN) 0.5 MG tablet TAKE 1 TABLET BY MOUTH TWICE A DAY AS NEEDED FOR ANXIETY 09/03/20   [name], MD  metoprolol tartrate (LOPRESSOR) 50 MG tablet TAKE 1/2 TABLET (25 MG) BY MOUTH TWICE DAILY 07/17/20   [name], MD  mirtazapine (REMERON) 15 MG tablet TAKE 1 TABLET BY MOUTH EVERYDAY AT BEDTIME 08/05/20   [name], MD  Multiple Vitamin (MULTIVITAMIN) capsule Take 1 capsule by mouth daily.    [provider]  Multiple Vitamins-Minerals (PRESERVISION AREDS 2+MULTI VIT PO) Take 1 capsule by mouth  in the morning and at bedtime.    [provider]  PRADAXA 150 MG CAPS capsule TAKE 1 CAPSULE BY MOUTH TWICE A DAY 03/14/20   [name], MD    Physical Exam: Vitals:   09/18/20 0813 09/18/20 1000 09/18/20 1045 09/18/20 1139  BP: (!) 168/103 117/82 (!) 143/74 136/74  Pulse: 83 76 69 61  Resp: (!) 21 (!) 26 15 (!) 21  SpO2: 97% 99% 98% 100%  Weight:      Height:         Vitals:   09/18/20 0813 09/18/20 1000 09/18/20 1045 09/18/20 1139  BP: (!) 168/103 117/82 (!) 143/74 136/74  Pulse: 83 76 69 61  Resp: (!) 21 (!) 26 15 (!) 21  SpO2: 97% 99% 98% 100%  Weight:      Height:          Constitutional: Alert and oriented x 3. Not in any apparent distress.  Appears comfortable on 4 L of oxygen HEENT:      Head: Normocephalic and atraumatic.         Eyes: PERLA, EOMI, Conjunctivae is pale. Sclera is non-icteric.       Mouth/Throat: Mucous membranes are moist.       Neck: Supple with no signs of meningismus. Cardiovascular: Regular rate and rhythm. No murmurs, gallops, or rubs. 2+ symmetrical distal pulses are present . No JVD. No LE edema Respiratory: Respiratory effort normal. Crackles at the bases, no rhonchi.  Gastrointestinal: Soft, non tender, and non  distended with positive bowel sounds.  Genitourinary: No CVA tenderness. Musculoskeletal: Nontender with normal range of motion in all extremities. No cyanosis, or erythema of extremities. Neurologic:  Face is symmetric. Moving all extremities. No gross focal neurologic deficits  Skin: Skin is warm, dry.  No rash or ulcers Psychiatric: Mood and affect are normal   Labs on Admission: I have personally reviewed following labs and imaging studies  CBC: Recent Labs  Lab 09/18/20 0815  WBC 9.5  HGB 10.8*  HCT 32.7*  MCV 110.8*  PLT 151   Basic Metabolic Panel: Recent Labs  Lab 09/18/20 0815  NA 140  K 4.8  CL 110  CO2 19*  GLUCOSE 86  BUN 27*  CREATININE 1.01*  CALCIUM 8.6*   GFR: Estimated Creatinine Clearance: 36.9 mL/min (A) (by C-G formula based on SCr of 1.01 mg/dL (H)). Liver Function Tests: Recent Labs  Lab 09/18/20 0815  AST 46*  ALT 26  ALKPHOS 100  BILITOT 1.0  PROT 6.7  ALBUMIN 3.7   No results for input(s): LIPASE, AMYLASE in the last 168 hours. No results for input(s): AMMONIA in the last 168 hours. Coagulation Profile: Recent Labs  Lab 09/18/20 0815  INR 1.5*   Cardiac Enzymes: No results for input(s): CKTOTAL, CKMB, CKMBINDEX, TROPONINI in the last 168 hours. BNP (last 3 results) No results for input(s): PROBNP in the last 8760 hours. HbA1C: No results for input(s): HGBA1C in the last 72 hours. CBG: No results for input(s): GLUCAP in the last 168 hours. Lipid Profile: No results for input(s): CHOL, HDL, LDLCALC, TRIG, CHOLHDL, LDLDIRECT in the last 72 hours. Thyroid Function Tests: No results for input(s): TSH, T4TOTAL, FREET4, T3FREE, THYROIDAB in the last 72 hours. Anemia Panel: No results for input(s): VITAMINB12, FOLATE, FERRITIN, TIBC, IRON, RETICCTPCT in the last 72 hours. Urine analysis: No results found for: COLORURINE, APPEARANCEUR, LABSPEC, PHURINE, GLUCOSEU, HGBUR, BILIRUBINUR, KETONESUR, PROTEINUR, UROBILINOGEN, NITRITE,  LEUKOCYTESUR  Radiological Exams on Admission: DG Chest Portable  1 View  Result Date: 09/18/2020 CLINICAL DATA:  Suspected CHF.  Shortness of breath EXAM: PORTABLE CHEST 1 VIEW COMPARISON:  09/10/2017 FINDINGS: Stable mild cardiomegaly. Interstitial opacity with Kerley lines and small pleural effusions. Possible chronic hyperinflation. Stable aortic and hilar contours. No pneumothorax. IMPRESSION: CHF. Electronically Signed   By: Monte Fantasia M.D.   On: 09/18/2020 08:37     Assessment/Plan Principal Problem:   Acute CHF (congestive heart failure) (HCC) Active Problems:   GERD (gastroesophageal reflux disease)   Hypertension   Anemia of chronic disease   Atrial fibrillation (HCC)   Chronic kidney disease, stage 3, mod decreased GFR (HCC)   Troponin level elevated     Acute on chronic diastolic dysfunction CHF Patient presents for evaluation of worsening shortness of breath and a 10 pound weight gain over the last 1 week Last known LVEF from 2D echocardiogram done in September 2021 shows an LVEF of 60 to 65% Her diuretic had been recently discontinued We will place patient on Lasix 40 mg IV every 12 Optimize blood pressure control Maintain low-sodium diet    Paroxysmal atrial fibrillation Continue flecainide and metoprolol for rate control Continue Pradaxa as primary prophylaxis for an acute stroke    Anemia of chronic disease H&H is stable Monitor closely during this hospitalization   Hypertension complications of stage III chronic kidney disease Continue metoprolol Renal function is stable   Anxiety and depression Continue Ativan and Remeron   Elevated troponin levels Most likely secondary to acute CHF exacerbation Patient has a bump in her troponin Continue metoprolol We will trend troponin levels   DVT prophylaxis: Pradaxa Code Status: DNR  Family Communication: Greater than 50% of time was spent discussing patient's condition and plan of care with her  and her daughter at the bedside.  All questions and concerns have been addressed.  CODE STATUS was discussed and she is a DO NOT RESUSCITATE Disposition Plan: Back to previous home environment Consults called: Cardiology Status: Inpatient.  The medical decision making for this patient was of high and patient is at high risk for clinical deterioration during this hospitalization.    Collier Bullock MD [facility]     09/18/2020, 12:59 PM

## 2020-09-18 NOTE — ED Provider Notes (Signed)
[facility] Emergency Department Provider Note   ____________________________________________    I have reviewed the triage vital signs and the nursing notes.   HISTORY  Chief Complaint Shortness of Breath and Chest Pain     HPI [name] is a 80 y.o. female with a history of CHF, A. fib on Pradaxa hypertension, hyperlipidemia who presents with shortness of breath.  Patient reports over the last 5 days she has had worsening shortness of breath, became severe last night.  EMS reports room air sats of 82%.  On 4 L nasal cannula oxygen saturations are 95 to 96%.  She describes some mild chest discomfort.  She reports she was taken off of her Lasix 10 days ago and over that time has gained 10 pounds but denies lower extremity swelling.  No fevers chills or cough.  Past Medical History:  Diagnosis Date  . (HFpEF) heart failure with preserved ejection fraction ([place])    a. 08/2017 Echo: EF 55-60%, no rwma, mild MR, mildly dil LA, nl RV fxn; b. 03/2020 Echo: EF 60-65%, no rwma, Gr2 DD. RVSP 44.45mmHg. Mod dil LA. Mild MR.  . Breast cancer ([place]) 2001   left breast  . Cancer ([place]) 2001   left breast ca  . Carotid arterial disease ([place])    a. 10/2004 s/p L CEA; b. 12/2015 Carotid U/S: RICA 1-39%; b. LICA patent CEA site.  . Closed right hip fracture ([place]) 09/20/2019  . Dysrhythmia   . GERD (gastroesophageal reflux disease)   . Hyperlipidemia   . Hypertension   . Osteoarthritis, multiple sites   . Osteopenia   . Persistent atrial fibrillation ([place])    a. Dx 08/2017; b. CHA2DS2VASc = 6-->Pradaxa; c. 09/2017 Successful DCCV (second shock - 200J); d. 10/2017 Recurrent Afib-->flecainide started 11/2017; e. 06/2020 s/p DCCV (200J x 1).  . Personal history of radiation therapy 2001   left breast ca  . Psoriasis     Patient Active Problem List   Diagnosis Date Noted  . Itching 09/05/2020  . Coagulation defect ([place]) 08/12/2020  . 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 Chronic neck pain 04/15/2020  . Alcohol abuse 04/05/2020  . Chronic kidney disease, stage 3, mod decreased GFR (HCC) 04/05/2020  . DOE (dyspnea on exertion) 02/09/2020  . Anxiety 01/23/2020  . Gout 05/18/2019  . Nodule of finger, bilateral 03/22/2019  . [place] 03/09/2019  . Chronic bilateral low back pain without sciatica 12/31/2017  . Atrial fibrillation ([place]) 09/10/2017  . Depression, major, single episode, mild ([place]) 09/10/2017  . Chronic venous insufficiency 09/09/2017  . Lymphedema 09/09/2017  . Dry eyes 07/06/2017  . Fall 05/01/2016  . Leg weakness, bilateral 12/30/2015  . Anemia 05/19/2013  . Psoriatic arthritis ([place]) 02/03/2013  . Psoriasis   . Osteoarthritis, multiple sites   . History of breast cancer 10/28/2011  . GERD (gastroesophageal reflux disease)   . Hypertension   . Osteopenia   . Carotid stenosis 05/27/2011  . Occlusion and stenosis of carotid artery without mention of cerebral infarction 05/27/2011    Past Surgical History:  Procedure Laterality Date  . ABDOMINAL HYSTERECTOMY    . ANKLE FRACTURE SURGERY  4/08   left---hardware still in place  . 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 BREAST EXCISIONAL BIOPSY Left yrs ago   benign  . BREAST LUMPECTOMY Left 2001   f/u radiation  . CARDIOVERSION N/A 10/04/2017   Procedure: CARDIOVERSION;  Surgeon: Wellington Hampshire,  MD;  Location: [facility];  Service: Cardiovascular;  Laterality: N/A;  . CARDIOVERSION N/A 12/16/2017   Procedure: CARDIOVERSION;  Surgeon: [name], MD;  Location: [facility];  Service: Cardiovascular;  Laterality: N/A;  . CARDIOVERSION N/A 06/24/2020   Procedure: CARDIOVERSION;  Surgeon: Wellington Hampshire, MD;  Location: [facility];  Service: Cardiovascular;  Laterality: N/A;  . CAROTID ENDARTERECTOMY Left 10/23/2004  . FRACTURE SURGERY    . OOPHORECTOMY    . SHOULDER SURGERY  6/07   left  . TONSILLECTOMY AND ADENOIDECTOMY    . TOTAL HIP ARTHROPLASTY   2004   right    Prior to Admission medications   Medication Sig Start Date End Date Taking? Authorizing Provider  allopurinol (ZYLOPRIM) 100 MG tablet Take 1 tablet (100 mg total) by mouth daily. 08/08/20   [name], MD  Cholecalciferol (VITAMIN D3) 1000 units CAPS Take 1 capsule by mouth 2 (two) times daily.     [provider]  flecainide (TAMBOCOR) 50 MG tablet Take 1.5 tablets (75 mg total) by mouth 2 (two) times daily. 07/23/20   Theora Gianotti, NP  furosemide (LASIX) 40 MG tablet Take 20 mg by mouth daily.    [provider]  LORazepam (ATIVAN) 0.5 MG tablet TAKE 1 TABLET BY MOUTH TWICE A DAY AS NEEDED FOR ANXIETY 09/03/20   [name], MD  metoprolol tartrate (LOPRESSOR) 50 MG tablet TAKE 1/2 TABLET (25 MG) BY MOUTH TWICE DAILY 07/17/20   [name], MD  mirtazapine (REMERON) 15 MG tablet TAKE 1 TABLET BY MOUTH EVERYDAY AT BEDTIME 08/05/20   [name], MD  Multiple Vitamin (MULTIVITAMIN) capsule Take 1 capsule by mouth daily.    [provider]  Multiple Vitamins-Minerals (PRESERVISION AREDS 2+MULTI VIT PO) Take 1 capsule by mouth in the morning and at bedtime.    [provider]  PRADAXA 150 MG CAPS capsule TAKE 1 CAPSULE BY MOUTH TWICE A DAY 03/14/20   [name], MD     Allergies Adhesive [tape]  Family History  Problem Relation Age of Onset  . Hypertension Brother   . Pneumonia Mother   . Leukemia Father   . Heart disease Neg Hx   . Diabetes Neg Hx   . Breast cancer Neg Hx     Social History Social History   Tobacco Use  . Smoking status: Former Smoker    Packs/day: 1.00    Years: 40.00    Pack years: 40.00    Types: Cigarettes    Quit date: 07/21/1995    Years since quitting: 25.1  . Smokeless tobacco: Never Used  Vaping Use  . Vaping Use: Never used  Substance Use Topics  . Alcohol use: Yes    Alcohol/week: 16.0 standard drinks    Types: 2 Glasses of wine, 14 Standard drinks or  equivalent per week    Comment: daily 1-2  . Drug use: No    Review of Systems  Constitutional: No fever/chills Eyes: No visual changes.  ENT: No sore throat. Cardiovascular: As above Respiratory: As above Gastrointestinal: No abdominal pain.  No nausea, no vomiting.   Genitourinary: Negative for dysuria. Musculoskeletal: No lower extremity swelling Skin: Negative for rash. Neurological: Negative for headaches or weakness   ____________________________________________   PHYSICAL EXAM:  VITAL SIGNS: ED Triage Vitals  Enc Vitals Group     BP 09/18/20 0813 (!) 168/103     Pulse Rate 09/18/20 0813 83     Resp 09/18/20 0813 (!)  21     Temp --      Temp src --      SpO2 09/18/20 0813 97 %     Weight 09/18/20 0808 53.5 kg (118 lb)     Height 09/18/20 0808 1.676 m (5\' 6" )     Head Circumference --      Peak Flow --      Pain Score 09/18/20 0806 5     Pain Loc --      Pain Edu? --      Excl. in [place]? --     Constitutional: Alert and oriented.   Nose: No congestion/rhinnorhea. Mouth/Throat: Mucous membranes are moist.   Neck:  Painless ROM Cardiovascular: Normal rate, regular rhythm. Grossly normal heart sounds.  Good peripheral circulation. Respiratory: Increased respiratory effort with tachypnea.  No retractions.  Bibasilar Rales Gastrointestinal: Soft and nontender. No distention.  No CVA tenderness.  Musculoskeletal: No lower extremity tenderness nor edema.  Warm and well perfused Neurologic:  Normal speech and language. No gross focal neurologic deficits are appreciated.  Skin:  Skin is warm, dry and intact. No rash noted. Psychiatric: Mood and affect are normal. Speech and behavior are normal.  ____________________________________________   LABS (all labs ordered are listed, but only abnormal results are displayed)  Labs Reviewed  CBC - Abnormal; Notable for the following components:      Result Value   RBC 2.95 (*)    Hemoglobin 10.8 (*)    HCT 32.7 (*)     MCV 110.8 (*)    MCH 36.6 (*)    All other components within normal limits  BRAIN NATRIURETIC PEPTIDE  COMPREHENSIVE METABOLIC PANEL  APTT  PROTIME-INR  TROPONIN I (HIGH SENSITIVITY)   ____________________________________________  EKG  ED ECG REPORT I, Lavonia Drafts, the attending physician, personally viewed and interpreted this ECG.  Date: 09/18/2020  Rhythm: normal sinus rhythm QRS Axis: normal Intervals: Incomplete right bundle branch block ST/T Wave abnormalities: Nonspecific changes Narrative Interpretation: no evidence of acute ischemia  ____________________________________________  RADIOLOGY  Chest x-ray viewed by me, consistent with CHF  ____________________________________________   PROCEDURES  Procedure(s) performed: No  Procedures   Critical Care performed: yes  CRITICAL CARE Performed by: Lavonia Drafts   Total critical care time: 30 minutes  Critical care time was exclusive of separately billable procedures and treating other patients.  Critical care was necessary to treat or prevent imminent or life-threatening deterioration.  Critical care was time spent personally by me on the following activities: development of treatment plan with patient and/or surrogate as well as nursing, discussions with consultants, evaluation of patient's response to treatment, examination of patient, obtaining history from patient or surrogate, ordering and performing treatments and interventions, ordering and review of laboratory studies, ordering and review of radiographic studies, pulse oximetry and re-evaluation of patient's condition.  ____________________________________________   INITIAL IMPRESSION / ASSESSMENT AND PLAN / ED COURSE  Pertinent labs & imaging results that were available during my care of the patient were reviewed by me and considered in my medical decision making (see chart for details).  Patient presents with shortness of breath, room air  saturations of 82%.  She is on 4 L nasal cannula here.  Given history of stopping Lasix 10 days ago and 10 pound weight gain over the last week now with worsening shortness of breath, rales on exam highly suspicious for CHF exacerbation/pulmonary edema.  Reviewed chart, patient had an EF of 55 to 60% in 2018, no history of admission  for shortness of breath in the past.  Markedly hypertensive as well, she already has Nitropaste applied, will give low-dose of enalaprilat to reduce afterload  Pending chest x-ray and labs  Chest x-ray is consistent with CHF, will add 40 mg of IV Lasix  Patient will require admission for diuresis, further management, will consult the hospitalist service      ____________________________________________   FINAL CLINICAL IMPRESSION(S) / ED DIAGNOSES  Final diagnoses:  Acute on chronic congestive heart failure, unspecified heart failure type ([place])  Acute respiratory failure with hypoxia ([place])        Note:  This document was prepared using Dragon voice recognition software and may include unintentional dictation errors.   Lavonia Drafts, MD 09/18/20 [phone]

## 2020-09-18 NOTE — ED Triage Notes (Addendum)
Pt to ED from home via [facility] C/o CP and SOB that started about 5d ago, got worse this morning. Pt states already experiences SOB with exertion Per EMS pt was 82% on RA, 88% on 4L oxygen per [facility] Aspirin 324mg  and nitro paste 1 inch given by EMS Pt now 95% 4L Pt used to take Lasix, stopped 1 week ago per MD recommendation because had dry mouth. States gained 10 pounds this week. Pt has hx a fib. Pt in NAD, speaking in full sentences.

## 2020-09-18 NOTE — ED Notes (Signed)
Pt alert, watching tv.   

## 2020-09-18 NOTE — Consult Note (Signed)
Cardiology Consultation:   Patient ID: [name] MRN: [id number]; DOB: 21-Apr-1941  Admit date: 09/18/2020 Date of Consult: 09/18/2020  PCP:  [name], MD   [facility]  Cardiologist:  Virl Axe, MD  Advanced Practice Provider:  No care team member to display Electrophysiologist:  None   :[id number]}  Patient Profile:   [name] is a 80 y.o. female with a hx of persistent atrial fibrillation, HFpEF, left breast cancer, carotid arterial disease, GERD, hypertension, hyperlipidemia, and alcohol use who is being seen today for the evaluation of CHF at the request of Dr. Francine Graven.  History of Present Illness:   [name] is followed by Dr. Caryl Comes for atrial fibrillation.  Patient was admitted February 2019 to [facility] with progressive lower extremity swelling and dyspnea.  She was found to be in A. fib and started on a beta-blocker, dilltiazem, and Xarelto. Echo showed normal LV function and she was discharged. She underwent cardioversion in March 2019 but was back in A. fib at the time of follow-up in April 2019.  She was subsequently placed on flecainide and Xarelto therapy was switched to dabigatran. Setting of dyspnea on exertion in August 2020 when she underwent echocardiogram that showed normal LV function, grade 2 diastolic dysfunction and elevated RVSP of 44 mmHg. She was advised to increase Lasix to 40 mg in the morning and 20 in the afternoon. Patient was seen in the ER in early September following a fall.  EtOH level was 269.  Cardiology follow-up on June 20, 2020 she was noted to be back in A. fib reported increased dyspnea on exertion.  She was advised to cut back on alcohol and underwent successful cardioversion December 6.  In follow-up December 17 she was noted to be back in A. fib with a rate of 108.  Flecainide was increased to 75 mg twice daily. She was seen 07/25/2020 and was in A. fib. She had fatigue but was overall  stable. She was working on cutting back alcohol. Cardioversion was diccussed but she had missed a dose of Pradaxa.   Seen by Dr. Caryl Comes 08/16/2020 and was in sinus rhythm with first-degree AV block.  Flecainide was continued.  Also reported recurrent falls. Orthostatic vitals were performed and losartan was discontinued.  The patient was seen by her PCP 09/05/20 for itching, dry/cotton mouth. She did have minimal some sob at that time. Labs were done which showed elevated Cr/BUN and she was told to decrease her lasix pill. She is unsure how much lasix she was taking, but cut the dose in half. She reported relief of itching and dry mouth  with decreasing the lasix dose.   The patient presented to the ER 09/18/2020 for shortness of breath. Patient reported breathing started worsening 1 week ago. Last night it became acutely worse and woke the patient up. Patient reported weight gain of 10 lbs in the last week, which she felt was good since she has low appetite. Also reported chest heaviness. This was intermittent, but more severe the last few days. Also reported neck and back pain. Denies worsening lower leg edema, palpitations, lightheadedness and dizziness. EMS was called who reported oxygen in the 80s and started on 4 L O2.    In the ED blood pressure 160/103, pulse 83, respiratory 21.  Labs showed potassium 4.8, glucose 86, creatinine 1.01, BUN 27, AST 46, ALT 26, WBC 9.5, hemoglobin 10.8. HS troponin 13>57>90.  Chest x-ray showed CHF.  EKG shows  sinus rhythm with first-degree AV block.  She reports 1 drink before dinner (martini) and 1 glass of wine during dinner. She lives by herself and rarely uses cane or walker. Denies recent fall. She has not missed any Pradaxa doses.    Past Medical History:  Diagnosis Date  . (HFpEF) heart failure with preserved ejection fraction ([place])    a. 08/2017 Echo: EF 55-60%, no rwma, mild MR, mildly dil LA, nl RV fxn; b. 03/2020 Echo: EF 60-65%, no rwma, Gr2 DD. RVSP  44.59mmHg. Mod dil LA. Mild MR.  Marland Kitchen Acute CHF (congestive heart failure) ([facility]) 09/18/2020  . Breast cancer ([place]) 2001   left breast  . Cancer ([place]) 2001   left breast ca  . Carotid arterial disease ([place])    a. 10/2004 s/p L CEA; b. 12/2015 Carotid U/S: RICA 1-39%; b. LICA patent CEA site.  . Closed right hip fracture ([place]) 09/20/2019  . Dysrhythmia   . GERD (gastroesophageal reflux disease)   . Hyperlipidemia   . Hypertension   . Osteoarthritis, multiple sites   . Osteopenia   . Persistent atrial fibrillation ([place])    a. Dx 08/2017; b. CHA2DS2VASc = 6-->Pradaxa; c. 09/2017 Successful DCCV (second shock - 200J); d. 10/2017 Recurrent Afib-->flecainide started 11/2017; e. 06/2020 s/p DCCV (200J x 1).  . Personal history of radiation therapy 2001   left breast ca  . Psoriasis     Past Surgical History:  Procedure Laterality Date  . ABDOMINAL HYSTERECTOMY    . ANKLE FRACTURE SURGERY  4/08   left---hardware still in place  . 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 BREAST EXCISIONAL BIOPSY Left yrs ago   benign  . BREAST LUMPECTOMY Left 2001   f/u radiation  . CARDIOVERSION N/A 10/04/2017   Procedure: CARDIOVERSION;  Surgeon: Wellington Hampshire, MD;  Location: [facility];  Service: Cardiovascular;  Laterality: N/A;  . CARDIOVERSION N/A 12/16/2017   Procedure: CARDIOVERSION;  Surgeon: [name], MD;  Location: [facility];  Service: Cardiovascular;  Laterality: N/A;  . CARDIOVERSION N/A 06/24/2020   Procedure: CARDIOVERSION;  Surgeon: Wellington Hampshire, MD;  Location: [facility];  Service: Cardiovascular;  Laterality: N/A;  . CAROTID ENDARTERECTOMY Left 10/23/2004  . FRACTURE SURGERY    . OOPHORECTOMY    . SHOULDER SURGERY  6/07   left  . TONSILLECTOMY AND ADENOIDECTOMY    . TOTAL HIP ARTHROPLASTY  2004   right     Home Medications:  Prior to Admission medications   Medication Sig Start Date End Date Taking? Authorizing Provider  allopurinol (ZYLOPRIM) 100 MG tablet Take 1 tablet (100 mg  total) by mouth daily. 08/08/20  Yes [name], MD  Cholecalciferol (VITAMIN D3) 1000 units CAPS Take 1 capsule by mouth 2 (two) times daily.    Yes [provider]  flecainide (TAMBOCOR) 50 MG tablet Take 1.5 tablets (75 mg total) by mouth 2 (two) times daily. 07/23/20  Yes Theora Gianotti, NP  furosemide (LASIX) 40 MG tablet Take 20 mg by mouth daily.   Yes [provider]  LORazepam (ATIVAN) 0.5 MG tablet TAKE 1 TABLET BY MOUTH TWICE A DAY AS NEEDED FOR ANXIETY 09/03/20  Yes [name], MD  metoprolol tartrate (LOPRESSOR) 50 MG tablet TAKE 1/2 TABLET (25 MG) BY MOUTH TWICE DAILY 07/17/20  Yes [name], MD  mirtazapine (REMERON) 15 MG tablet TAKE 1 TABLET BY MOUTH EVERYDAY AT BEDTIME 08/05/20  Yes [name], MD  Multiple  Vitamin (MULTIVITAMIN) capsule Take 1 capsule by mouth daily.   Yes [provider]  PRADAXA 150 MG CAPS capsule TAKE 1 CAPSULE BY MOUTH TWICE A DAY 03/14/20  Yes [name], MD  Multiple Vitamins-Minerals (PRESERVISION AREDS 2+MULTI VIT PO) Take 1 capsule by mouth in the morning and at bedtime. Patient not taking: Reported on 09/18/2020    [provider]    Inpatient Medications: Scheduled Meds: . allopurinol  100 mg Oral Daily  . dabigatran  150 mg Oral BID  . flecainide  75 mg Oral BID  . furosemide  40 mg Intravenous Q12H  . LORazepam  0.5 mg Oral BID  . metoprolol tartrate  25 mg Oral BID  . mirtazapine  15 mg Oral QHS  . sodium chloride flush  3 mL Intravenous Q12H  . Vitamin D3  1 capsule Oral BID   Continuous Infusions: . sodium chloride     PRN Meds: sodium chloride, acetaminophen, ondansetron (ZOFRAN) IV, sodium chloride flush  Allergies:    Allergies  Allergen Reactions  . Adhesive [Tape] Other (See Comments)    "Does not work well with me"    Social History:   Social History   Socioeconomic History  . Marital status: Widowed    Spouse name: Not on file  . Number of  children: Not on file  . Years of education: Not on file  . Highest education level: Not on file  Occupational History  . Occupation: Pharmacologist    Comment: Retired  Tobacco Use  . Smoking status: Former Smoker    Packs/day: 1.00    Years: 40.00    Pack years: 40.00    Types: Cigarettes    Quit date: 07/21/1995    Years since quitting: 25.1  . Smokeless tobacco: Never Used  Vaping Use  . Vaping Use: Never used  Substance and Sexual Activity  . Alcohol use: Yes    Alcohol/week: 16.0 standard drinks    Types: 2 Glasses of wine, 14 Standard drinks or equivalent per week    Comment: daily 1-2  . Drug use: No  . Sexual activity: Never  Other Topics Concern  . Not on file  Social History Narrative   1 natural child   3 adopted children   Artist---still teaches water colors   Husband has Alzheimers      Has living will   Daughter Amy is health care POA   DNR    No tube feeds if cognitively unaware   Social Determinants of Health   Financial Resource Strain: Low Risk   . Difficulty of Paying Living Expenses: Not hard at all  Food Insecurity: No Food Insecurity  . Worried About Charity fundraiser in the Last Year: Never true  . Ran Out of Food in the Last Year: Never true  Transportation Needs: No Transportation Needs  . Lack of Transportation (Medical): No  . Lack of Transportation (Non-Medical): No  Physical Activity: Not on file  Stress: Not on file  Social Connections: Not on file  Intimate Partner Violence: Not on file    Family History:    Family History  Problem Relation Age of Onset  . Hypertension Brother   . Pneumonia Mother   . Leukemia Father   . Heart disease Neg Hx   . Diabetes Neg Hx   . Breast cancer Neg Hx      ROS:  Please see the history of present illness.  All other ROS reviewed and negative.  Physical Exam/Data:   Vitals:   09/18/20 0813 09/18/20 1000 09/18/20 1045 09/18/20 1139  BP: (!) 168/103 117/82 (!) 143/74 136/74  Pulse: 83  76 69 61  Resp: (!) 21 (!) 26 15 (!) 21  SpO2: 97% 99% 98% 100%  Weight:      Height:       No intake or output data in the 24 hours ending 09/18/20 1359 Last 3 Weights 09/18/2020 09/05/2020 08/08/2020  Weight (lbs) 118 lb 116 lb 3.2 oz 114 lb 8 oz  Weight (kg) 53.524 kg 52.708 kg 51.937 kg     Body mass index is 19.05 kg/m.  General:  Well nourished, well developed, in no acute distress HEENT: normal Lymph: no adenopathy Neck: + JVD Endocrine:  No thryomegaly Vascular: No carotid bruits; FA pulses 2+ bilaterally without bruits  Cardiac:  normal S1, S2; RRR; no murmur  Lungs:  crackles Abd: soft, nontender, no hepatomegaly  Ext: no edema Musculoskeletal:  No deformities, BUE and BLE strength normal and equal Skin: warm and dry  Neuro:  CNs 2-12 intact, no focal abnormalities noted Psych:  Normal affect   EKG:  The EKG was personally reviewed and demonstrates:  SR, first degree AV block.  Telemetry:  Telemetry was personally reviewed and demonstrates:  SR, first degee AV block, HR 60-70s  Relevant CV Studies:  Echo 03/29/20 1. Left ventricular ejection fraction, by estimation, is 60 to 65%. The  left ventricle has normal function. The left ventricle has no regional  wall motion abnormalities. Left ventricular diastolic parameters are  consistent with Grade II diastolic  dysfunction (pseudonormalization).  2. Right ventricular systolic function is normal. The right ventricular  size is normal. There is mildly elevated pulmonary artery systolic  pressure. The estimated right ventricular systolic pressure is 93.8 mmHg.  3. Left atrial size was moderately dilated.  4. Mild mitral valve regurgitation.   Laboratory Data:  High Sensitivity Troponin:   Recent Labs  Lab 09/18/20 0815 09/18/20 0958 09/18/20 1225  TROPONINIHS 13 57* 90*     Chemistry Recent Labs  Lab 09/18/20 0815  NA 140  K 4.8  CL 110  CO2 19*  GLUCOSE 86  BUN 27*  CREATININE 1.01*  CALCIUM 8.6*   GFRNONAA 56*  ANIONGAP 11    Recent Labs  Lab 09/18/20 0815  PROT 6.7  ALBUMIN 3.7  AST 46*  ALT 26  ALKPHOS 100  BILITOT 1.0   Hematology Recent Labs  Lab 09/18/20 0815  WBC 9.5  RBC 2.95*  HGB 10.8*  HCT 32.7*  MCV 110.8*  MCH 36.6*  MCHC 33.0  RDW 13.4  PLT 249   BNP Recent Labs  Lab 09/18/20 0814  BNP 526.8*    DDimer No results for input(s): DDIMER in the last 168 hours.   Radiology/Studies:  DG Chest Portable 1 View  Result Date: 09/18/2020 CLINICAL DATA:  Suspected CHF.  Shortness of breath EXAM: PORTABLE CHEST 1 VIEW COMPARISON:  09/10/2017 FINDINGS: Stable mild cardiomegaly. Interstitial opacity with Kerley lines and small pleural effusions. Possible chronic hyperinflation. Stable aortic and hilar contours. No pneumothorax. IMPRESSION: CHF. Electronically Signed   By: Monte Fantasia M.D.   On: 09/18/2020 08:37     Assessment and Plan:   Acute on chronic HFpEF -Presents with worsening shortness of breath and 10 pound weight gain since Lasix was decreased ago by PCP about 10 days ago for itching and dry mouth. Labs showed she was dehydrated at the time. Patient unsure of lasix  dose. -Chest x-ray with CHF and BNP 526 -Started on IV Lasix 40 mg twice daily - Weight is 118lbs. Suspect dry weight around 110lbs.  -Monitor strict I's and O's, creatinine, daily weights with diuresis - continue BB  Elevated troponin - Patient reports chest heaviness, which could be from volume overload - HS troponin trend 13>57>90, can continue to trend - No active chest pain - No significant CAD history although does have RF - suspect mostly demand ischemia  Persistent atrial fibrillation -Patient has a history of 2 cardioversions in the past on flecainide. At her last follow-up she was in sinus rhythm with prolonged PR interval. -EKG this admission shows sinus rhythm with first-degree AV block -Rate controlled with Lopressor -Continue Pradaxa for  anticoagulation  Hypertension -Patient has a history of bradycardia as well as orthostatic symptoms. Losartan was stopped at the last visit for orthostaticsymptoms and muliple falls. -PTA Lopressor 25 mg daily>> continue - she was given enalapril injection in the ED  Chronic anemia -H&H stable  Hyperlipidemia -She has previously refused statins -LDL 104 in June 2020  Alcohol abuse - Has affected frequency of atrial fibrillation in the past and total cessation encouraged. - Reports she is drinking 2 alcoholic drinks a night  For questions or updates, please contact [place] Please consult [email or web address] for contact info under    Signed, Cadence Ninfa Meeker, PA-C  09/18/2020 1:59 PM

## 2020-09-18 NOTE — ED Notes (Signed)
Resumed care from [facility] .  Pt alert.  Pt waitng on admission .  Nsr on monitor   Iv in place.  Pt on oxygen.

## 2020-09-18 NOTE — ED Notes (Signed)
Pt eating dinner   nsr on monitor.  Pt alert  Speech clear.  Pt on 2 liters oxygen [place].  Pt waiting on admission.

## 2020-09-18 NOTE — ED Notes (Signed)
Pt ambulated to and from the toilet with minimal assistance.

## 2020-09-18 NOTE — ED Notes (Signed)
Dr Francine Graven at bedside.  Daughter at bedside.

## 2020-09-19 ENCOUNTER — Inpatient Hospital Stay (HOSPITAL_COMMUNITY)
Admission: AD | Admit: 2020-09-19 | Discharge: 2020-09-22 | DRG: 309 | Disposition: A | Discharge status: Home / Self Care | Payer: Medicare Other | Source: Other Acute Inpatient Hospital | Attending: Internal Medicine | Admitting: Internal Medicine

## 2020-09-19 ENCOUNTER — Ambulatory Visit: Payer: Medicare Other | Admitting: Internal Medicine

## 2020-09-19 ENCOUNTER — Inpatient Hospital Stay (HOSPITAL_COMMUNITY)
Admit: 2020-09-19 | Discharge: 2020-09-19 | Disposition: A | Discharge status: Home / Self Care | Payer: Medicare Other | Attending: Internal Medicine | Admitting: Internal Medicine

## 2020-09-19 DIAGNOSIS — E785 Hyperlipidemia, unspecified: Secondary | ICD-10-CM | POA: Diagnosis present

## 2020-09-19 DIAGNOSIS — K219 Gastro-esophageal reflux disease without esophagitis: Secondary | ICD-10-CM

## 2020-09-19 DIAGNOSIS — I10 Essential (primary) hypertension: Secondary | ICD-10-CM

## 2020-09-19 DIAGNOSIS — Z853 Personal history of malignant neoplasm of breast: Secondary | ICD-10-CM

## 2020-09-19 DIAGNOSIS — J9601 Acute respiratory failure with hypoxia: Secondary | ICD-10-CM | POA: Diagnosis not present

## 2020-09-19 DIAGNOSIS — Z7289 Other problems related to lifestyle: Secondary | ICD-10-CM | POA: Diagnosis not present

## 2020-09-19 DIAGNOSIS — Z806 Family history of leukemia: Secondary | ICD-10-CM

## 2020-09-19 DIAGNOSIS — Z91048 Other nonmedicinal substance allergy status: Secondary | ICD-10-CM | POA: Diagnosis not present

## 2020-09-19 DIAGNOSIS — R55 Syncope and collapse: Secondary | ICD-10-CM | POA: Diagnosis not present

## 2020-09-19 DIAGNOSIS — R778 Other specified abnormalities of plasma proteins: Secondary | ICD-10-CM

## 2020-09-19 DIAGNOSIS — R001 Bradycardia, unspecified: Secondary | ICD-10-CM

## 2020-09-19 DIAGNOSIS — I472 Ventricular tachycardia, unspecified: Secondary | ICD-10-CM

## 2020-09-19 DIAGNOSIS — Z8249 Family history of ischemic heart disease and other diseases of the circulatory system: Secondary | ICD-10-CM

## 2020-09-19 DIAGNOSIS — I471 Supraventricular tachycardia: Secondary | ICD-10-CM | POA: Diagnosis not present

## 2020-09-19 DIAGNOSIS — I5032 Chronic diastolic (congestive) heart failure: Secondary | ICD-10-CM | POA: Diagnosis not present

## 2020-09-19 DIAGNOSIS — I480 Paroxysmal atrial fibrillation: Secondary | ICD-10-CM | POA: Diagnosis not present

## 2020-09-19 DIAGNOSIS — M8580 Other specified disorders of bone density and structure, unspecified site: Secondary | ICD-10-CM | POA: Diagnosis present

## 2020-09-19 DIAGNOSIS — I495 Sick sinus syndrome: Principal | ICD-10-CM

## 2020-09-19 DIAGNOSIS — I509 Heart failure, unspecified: Secondary | ICD-10-CM

## 2020-09-19 DIAGNOSIS — I499 Cardiac arrhythmia, unspecified: Secondary | ICD-10-CM | POA: Diagnosis not present

## 2020-09-19 DIAGNOSIS — I5021 Acute systolic (congestive) heart failure: Secondary | ICD-10-CM

## 2020-09-19 DIAGNOSIS — Z79899 Other long term (current) drug therapy: Secondary | ICD-10-CM | POA: Diagnosis not present

## 2020-09-19 DIAGNOSIS — Z7901 Long term (current) use of anticoagulants: Secondary | ICD-10-CM

## 2020-09-19 DIAGNOSIS — M1990 Unspecified osteoarthritis, unspecified site: Secondary | ICD-10-CM | POA: Diagnosis present

## 2020-09-19 DIAGNOSIS — R296 Repeated falls: Secondary | ICD-10-CM | POA: Diagnosis present

## 2020-09-19 DIAGNOSIS — L409 Psoriasis, unspecified: Secondary | ICD-10-CM | POA: Diagnosis present

## 2020-09-19 DIAGNOSIS — Z923 Personal history of irradiation: Secondary | ICD-10-CM | POA: Diagnosis not present

## 2020-09-19 DIAGNOSIS — I110 Hypertensive heart disease with heart failure: Secondary | ICD-10-CM | POA: Diagnosis present

## 2020-09-19 DIAGNOSIS — T462X5A Adverse effect of other antidysrhythmic drugs, initial encounter: Secondary | ICD-10-CM | POA: Diagnosis not present

## 2020-09-19 DIAGNOSIS — N1832 Chronic kidney disease, stage 3b: Secondary | ICD-10-CM

## 2020-09-19 DIAGNOSIS — I4819 Other persistent atrial fibrillation: Secondary | ICD-10-CM | POA: Diagnosis present

## 2020-09-19 DIAGNOSIS — I959 Hypotension, unspecified: Secondary | ICD-10-CM

## 2020-09-19 DIAGNOSIS — Z96649 Presence of unspecified artificial hip joint: Secondary | ICD-10-CM | POA: Diagnosis present

## 2020-09-19 DIAGNOSIS — Z87891 Personal history of nicotine dependence: Secondary | ICD-10-CM

## 2020-09-19 DIAGNOSIS — D638 Anemia in other chronic diseases classified elsewhere: Secondary | ICD-10-CM | POA: Diagnosis not present

## 2020-09-19 DIAGNOSIS — I4891 Unspecified atrial fibrillation: Secondary | ICD-10-CM | POA: Diagnosis present

## 2020-09-19 DIAGNOSIS — Z96641 Presence of right artificial hip joint: Secondary | ICD-10-CM | POA: Diagnosis present

## 2020-09-19 DIAGNOSIS — I5031 Acute diastolic (congestive) heart failure: Secondary | ICD-10-CM | POA: Diagnosis not present

## 2020-09-19 LAB — BASIC METABOLIC PANEL
Anion gap: 8 (ref 5–15)
BUN: 33 mg/dL — ABNORMAL HIGH (ref 8–23)
CO2: 24 mmol/L (ref 22–32)
Calcium: 8.5 mg/dL — ABNORMAL LOW (ref 8.9–10.3)
Chloride: 104 mmol/L (ref 98–111)
Creatinine, Ser: 1.17 mg/dL — ABNORMAL HIGH (ref 0.44–1.00)
GFR, Estimated: 47 mL/min — ABNORMAL LOW (ref >60)
Glucose, Bld: 86 mg/dL (ref 70–99)
Potassium: 3.7 mmol/L (ref 3.5–5.1)
Sodium: 136 mmol/L (ref 135–145)

## 2020-09-19 LAB — SARS CORONAVIRUS 2 (TAT 6-24 HRS): SARS Coronavirus 2: NEGATIVE

## 2020-09-19 LAB — ECHOCARDIOGRAM COMPLETE
AR max vel: 1.85 cm2
AV Area VTI: 1.96 cm2
AV Area mean vel: 1.88 cm2
AV Mean grad: 2 mmHg
AV Peak grad: 3.1 mmHg
Ao pk vel: 0.88 m/s
Area-P 1/2: 3.06 cm2
Height: 66 in
S' Lateral: 2.4 cm
Weight: 1888 oz

## 2020-09-19 LAB — MRSA PCR SCREENING: MRSA by PCR: NEGATIVE

## 2020-09-19 LAB — GLUCOSE, CAPILLARY
Glucose-Capillary: 117 mg/dL — ABNORMAL HIGH (ref 70–99)
Glucose-Capillary: 212 mg/dL — ABNORMAL HIGH (ref 70–99)

## 2020-09-19 LAB — TROPONIN I (HIGH SENSITIVITY): Troponin I (High Sensitivity): 68 ng/L — ABNORMAL HIGH (ref <18)

## 2020-09-19 MED ORDER — CHLORHEXIDINE GLUCONATE CLOTH 2 % EX PADS
6.0000 | MEDICATED_PAD | Freq: Every day | CUTANEOUS | Status: DC
  Administered 2020-09-20 – 2020-09-21 (×2): 6 via TOPICAL

## 2020-09-19 MED ORDER — AMIODARONE HCL IN DEXTROSE 360-4.14 MG/200ML-% IV SOLN
30.0000 mg/h | INTRAVENOUS | Status: DC
  Administered 2020-09-20 (×2): 30 mg/h via INTRAVENOUS
  Filled 2020-09-19 (×2): qty 200

## 2020-09-19 MED ORDER — AMIODARONE HCL IN DEXTROSE 360-4.14 MG/200ML-% IV SOLN
INTRAVENOUS | Status: AC
  Filled 2020-09-19: qty 200

## 2020-09-19 MED ORDER — DOPAMINE-DEXTROSE 3.2-5 MG/ML-% IV SOLN
0.0000 ug/kg/min | INTRAVENOUS | Status: DC
  Administered 2020-09-19: 5 ug/kg/min via INTRAVENOUS

## 2020-09-19 MED ORDER — AMIODARONE IV BOLUS ONLY 150 MG/100ML
150.0000 mg | Freq: Once | INTRAVENOUS | Status: AC
  Administered 2020-09-19: 150 mg via INTRAVENOUS

## 2020-09-19 MED ORDER — SODIUM BICARBONATE 8.4 % IV SOLN
INTRAVENOUS | Status: AC
  Filled 2020-09-19: qty 850

## 2020-09-19 MED ORDER — SODIUM BICARBONATE 8.4 % IV SOLN
Freq: Once | INTRAVENOUS | Status: AC
  Filled 2020-09-19: qty 150

## 2020-09-19 MED ORDER — AMIODARONE HCL 150 MG/3ML IV SOLN
INTRAVENOUS | Status: AC | PRN
  Administered 2020-09-19: 300 mg via INTRAVENOUS

## 2020-09-19 MED ORDER — AMIODARONE IV BOLUS ONLY 150 MG/100ML
INTRAVENOUS | Status: AC
  Filled 2020-09-19: qty 100

## 2020-09-19 MED ORDER — NITROGLYCERIN 2 % TD OINT: 1.0000 [in_us] | TOPICAL_OINTMENT | Freq: Three times a day (TID) | TRANSDERMAL | Status: DC

## 2020-09-19 MED ORDER — ACETAMINOPHEN 325 MG PO TABS
650.0000 mg | ORAL_TABLET | ORAL | Status: DC | PRN
  Administered 2020-09-20 – 2020-09-21 (×4): 650 mg via ORAL
  Filled 2020-09-19 (×4): qty 2

## 2020-09-19 MED ORDER — MAGNESIUM SULFATE 2 GM/50ML IV SOLN: 2.0000 g | Freq: Once | INTRAVENOUS | 0 refills | Status: DC

## 2020-09-19 MED ORDER — AMIODARONE HCL IN DEXTROSE 360-4.14 MG/200ML-% IV SOLN: 30.0000 mg/h | INTRAVENOUS | Status: DC

## 2020-09-19 MED ORDER — CHLORHEXIDINE GLUCONATE CLOTH 2 % EX PADS: 6.0000 | MEDICATED_PAD | Freq: Every day | CUTANEOUS | Status: DC

## 2020-09-19 MED ORDER — MIRTAZAPINE 15 MG PO TABS: 15.0000 mg | ORAL_TABLET | Freq: Every day | ORAL | Status: DC

## 2020-09-19 MED ORDER — PHENTOLAMINE MESYLATE 5 MG IJ SOLR
5.0000 mg | Freq: Once | INTRAMUSCULAR | Status: DC
  Filled 2020-09-19: qty 5

## 2020-09-19 MED ORDER — LORAZEPAM 0.5 MG PO TABS: 0.5000 mg | ORAL_TABLET | Freq: Two times a day (BID) | ORAL | 0 refills | Status: DC | PRN

## 2020-09-19 MED ORDER — ACETAMINOPHEN 325 MG PO TABS: 650.0000 mg | ORAL_TABLET | ORAL | Status: DC | PRN

## 2020-09-19 MED ORDER — DABIGATRAN ETEXILATE MESYLATE 150 MG PO CAPS: 150.0000 mg | ORAL_CAPSULE | Freq: Two times a day (BID) | ORAL | Status: DC

## 2020-09-19 MED ORDER — FUROSEMIDE 10 MG/ML IJ SOLN: 40.0000 mg | Freq: Two times a day (BID) | INTRAMUSCULAR | 0 refills | Status: DC

## 2020-09-19 MED ORDER — MAGNESIUM SULFATE 2 GM/50ML IV SOLN
INTRAVENOUS | Status: AC
  Filled 2020-09-19: qty 50

## 2020-09-19 MED ORDER — MAGNESIUM SULFATE 2 GM/50ML IV SOLN
2.0000 g | Freq: Once | INTRAVENOUS | Status: AC
  Administered 2020-09-19: 2 g via INTRAVENOUS
  Filled 2020-09-19: qty 50

## 2020-09-19 MED ORDER — VITAMIN D3 25 MCG (1000 UNIT) PO TABS: 2000.0000 [IU] | ORAL_TABLET | Freq: Every day | ORAL | Status: DC

## 2020-09-19 MED ORDER — ONDANSETRON HCL 4 MG/2ML IJ SOLN: 4.0000 mg | Freq: Four times a day (QID) | INTRAMUSCULAR | Status: DC | PRN

## 2020-09-19 MED ORDER — AMIODARONE HCL IN DEXTROSE 360-4.14 MG/200ML-% IV SOLN
INTRAVENOUS | Status: AC
  Administered 2020-09-19: 30 mg/h via INTRAVENOUS
  Filled 2020-09-19: qty 200

## 2020-09-19 MED ORDER — ONDANSETRON HCL 4 MG/2ML IJ SOLN: 4.0000 mg | Freq: Four times a day (QID) | INTRAMUSCULAR | 0 refills | Status: DC | PRN

## 2020-09-19 MED ORDER — NITROGLYCERIN 2 % TD OINT: 1.0000 [in_us] | TOPICAL_OINTMENT | Freq: Three times a day (TID) | TRANSDERMAL | 0 refills | Status: DC

## 2020-09-19 MED ORDER — AMIODARONE HCL IN DEXTROSE 360-4.14 MG/200ML-% IV SOLN
60.0000 mg/h | INTRAVENOUS | Status: DC
  Administered 2020-09-19: 60 mg/h via INTRAVENOUS
  Filled 2020-09-19: qty 200

## 2020-09-19 NOTE — ED Provider Notes (Addendum)
Was called emergently patient's room she was complaining of pain shortness of breath and had evidence of ventricular tachycardia on the monitor.  Nursing assistant had started CPR but the patient pushed her off she was still responding.  Had a pulse but was tachycardic in the 170s.  She was given IV bolus of amiodarone with cardioversion to sinus rhythm.  Will consult cardiology to update and get further recommendations.  Procedures    Merlyn Lot, MD 09/19/20 1059    Merlyn Lot, MD 09/19/20 [phone]

## 2020-09-19 NOTE — Progress Notes (Signed)
Progress Note  Patient Name: [name] Date of Encounter: 09/19/2020  Primary Cardiologist: Virl Axe, MD  Subjective   Breathing much improved.  No chest pain.  Reports good UO, though I/O not recorded.  BP soft this AM - lasix and  blocker held.  She required 258ml saline bolus and BP currently improved @ 115.  Inpatient Medications    Scheduled Meds: . allopurinol  100 mg Oral Daily  . cholecalciferol  2,000 Units Oral Daily  . dabigatran  150 mg Oral BID  . flecainide  75 mg Oral BID  . furosemide  40 mg Intravenous Q12H  . mirtazapine  15 mg Oral QHS  . sodium chloride flush  3 mL Intravenous Q12H   Continuous Infusions: . sodium chloride 250 mL (09/19/20 0601)   PRN Meds: sodium chloride, acetaminophen, LORazepam, ondansetron (ZOFRAN) IV, sodium chloride flush   Vital Signs    Vitals:   09/19/20 0845 09/19/20 0915 09/19/20 0930 09/19/20 0945  BP: (!) 92/56 110/61 121/60 115/66  Pulse: (!) 58 (!) 56 (!) 59 (!) 58  Resp:  18 20 18   Temp:    98.2 F (36.8 C)  TempSrc:    Oral  SpO2: 95% 96% 96% 96%  Weight:      Height:       No intake or output data in the 24 hours ending 09/19/20 1008 Filed Weights   09/18/20 0808  Weight: 53.5 kg    Physical Exam   GEN: Thin, in no acute distress.  HEENT: Grossly normal.  Neck: Supple, no JVD, carotid bruits, or masses. Cardiac: RRR, no murmurs, rubs, or gallops. No clubbing, cyanosis, edema.  Radials 2+, DP/PT 2+ and equal bilaterally.  Respiratory:  Respirations regular and unlabored, bibasilar crackles. GI: Soft, nontender, nondistended, BS + x 4. MS: no deformity or atrophy. Skin: warm and dry, no rash. Neuro:  Strength and sensation are intact. Psych: AAOx3.  Normal affect.  Labs    Chemistry Recent Labs  Lab 09/18/20 0815 09/19/20 0340  NA 140 136  K 4.8 3.7  CL 110 104  CO2 19* 24  GLUCOSE 86 86  BUN 27* 33*  CREATININE 1.01* 1.17*  CALCIUM 8.6* 8.5*  PROT 6.7  --   ALBUMIN 3.7   --   AST 46*  --   ALT 26  --   ALKPHOS 100  --   BILITOT 1.0  --   GFRNONAA 56* 47*  ANIONGAP 11 8     Hematology Recent Labs  Lab 09/18/20 0815  WBC 9.5  RBC 2.95*  HGB 10.8*  HCT 32.7*  MCV 110.8*  MCH 36.6*  MCHC 33.0  RDW 13.4  PLT 249    Cardiac Enzymes  Recent Labs  Lab 09/18/20 0815 09/18/20 0958 09/18/20 1225 09/19/20 0340  TROPONINIHS 13 57* 90* 68*      BNP Recent Labs  Lab 09/18/20 0814  BNP 526.8*      Lipids  Lab Results  Component Value Date   CHOL 216 (H) 01/11/2019   HDL 88.90 01/11/2019   LDLCALC 104 (H) 01/11/2019   LDLDIRECT 151.4 02/01/2012   TRIG 116.0 01/11/2019   CHOLHDL 2 01/11/2019    HbA1c  Lab Results  Component Value Date   HGBA1C 5.3 12/18/2014    Radiology    DG Chest Portable 1 View  Result Date: 09/18/2020 CLINICAL DATA:  Suspected CHF.  Shortness of breath EXAM: PORTABLE CHEST 1 VIEW COMPARISON:  09/10/2017 FINDINGS: Stable mild cardiomegaly.  Interstitial opacity with Kerley lines and small pleural effusions. Possible chronic hyperinflation. Stable aortic and hilar contours. No pneumothorax. IMPRESSION: CHF. Electronically Signed   By: Monte Fantasia M.D.   On: 09/18/2020 08:37    Telemetry    Sinus brady - RSR.  Occas PVCs w/ brief period of ventricular bigeminy. - Personally Reviewed  Cardiac Studies   Echo 03/29/20 1. Left ventricular ejection fraction, by estimation, is 60 to 65%. The  left ventricle has normal function. The left ventricle has no regional  wall motion abnormalities. Left ventricular diastolic parameters are  consistent with Grade II diastolic  dysfunction (pseudonormalization).  2. Right ventricular systolic function is normal. The right ventricular  size is normal. There is mildly elevated pulmonary artery systolic  pressure. The estimated right ventricular systolic pressure is 18.8 mmHg.  3. Left atrial size was moderately dilated.  4. Mild mitral valve regurgitation.    Patient Profile     [name] is a 80 y.o. female with a hx of persistent atrial fibrillation, HFpEF, left breast cancer, carotid arterial disease, GERD, hypertension, hyperlipidemia, and alcohol use who was admitted 3/2 w/ HFpEF.  Assessment & Plan    1. Acute on chronic HFpEF: EF 60-65% by echo 03/2020 w/ grade 2 diast dysfxn.  Stopped taking lasix ~ 2-3 wks ago b/c of ongoing dry mouth, itching, and elevated creat (1.77 on 2/17).  Noted increasing DOE following this, and then wt went up 10 lbs over span of 2 days earlier this week (Mon/Tues).  Presented 3/2 secondary to inc dyspnea/orthopnea and was found to be hypertensive and volume overloaded.  She has diuresed well and reports good UO, though I/O incomplete.  Creat up slightly. She has fine basilar crackles on exam, but otw appears euvolemic.  Lasix held this AM due to hypotension req 250 ml bolus.  Currently stable. Echo pending.  It appears that she will need at least a low dose of an oral diuretic as an outpt.  Though she tolerated lasix for the better part of a year, she believes that it started causing her to itch (? 2/2 dry skin in setting of dehydration).  If she wishes to avoid oral lasix going forward, can try 10mg  of torsemide either daily or every other day.  Plan to resume  blocker.  2.  Demand Ischemia/elevated HsTroponin:  In the setting of dyspnea, CHF, elevated BP on admission, HsT up to 90  now down-trending.  She did report chest heaviness in the setting of dyspnea, along w/ a 10 day history of right neck/facial pain and tenderness.  Doubt ACS.  Echo pending.  If EF remains nl w/o wma's, likely ok for discharge today. Can consider outpt stress testing vs coronary CTA, esp in light of known carotid dzs and ongoing flecainide therapy.  3.  Persistent Atrial Fibrillation:  Maintaining sinus on flecainide.   blocker held this AM in setting of hypotension. Plan to resume metoprolol 12.5 BID @ discharge.  Cont  dabigatran.  4.  Essential HTN/acute hypotension: Though she was hypertensive on arrival, BP soft this AM.  Was down to 76/44 @ 0700.  blocker held. IV lasix dose also held this AM and she has received a 250 ml saline bolus.  SBP now 115.  Asymptomatic.  5.  HL: LDL 104 in 12/2018.  Refuses statins.  6.  Chronic anemia:  Relatively stable.  Signed, Murray Hodgkins, NP  09/19/2020, 10:08 AM    For questions or updates, please contact  Please consult [email or web address] for contact info under Cardiology/STEMI.

## 2020-09-19 NOTE — ED Notes (Signed)
MD aware of patient BP 75/56.

## 2020-09-19 NOTE — H&P (Incomplete)
Cardiology Admission History and Physical:   Patient ID: [name]; MRN: [id number]; DOB: 23-Dec-1940   Admission date: 09/19/2020  Primary Care Provider: [name], MD Primary Cardiologist: Virl Axe, MD   Chief Complaint:  Syncope (runs of wide complex tachycardia)  History of Present Illness:   [name] is a 80 y.o. female with a history of HFpEF, paroxysmal atrial fibrillation (on Flecainide & Pradaxa), ETOH use, frequent falls and hypertension, who presented with worsening shortness of breath and weight gain. Her lasix was discontinued 3 weeks prior to this hospitalization. She had a 10 lbs weight gain and increasing dyspnea after this. She presented to an outside hospital. BNP was 526, chest x-ray showed bilateral pleural effusions consistent with CHF.  EKG showed sinus rhythm, troponins were 57, 90.  She was diagnosed with CHF, given IV Lasix with adequate diuresing and subsequent improvement in symptoms.       Past Medical History:  Diagnosis Date  . (HFpEF) heart failure with preserved ejection fraction ([place])    a. 08/2017 Echo: EF 55-60%, no rwma, mild MR, mildly dil LA, nl RV fxn; b. 03/2020 Echo: EF 60-65%, no rwma, Gr2 DD. RVSP 44.53mmHg. Mod dil LA. Mild MR.  Marland Kitchen Acute CHF (congestive heart failure) ([place]) 09/18/2020  . Breast cancer ([place]) 2001   left breast  . Cancer ([place]) 2001   left breast ca  . Carotid arterial disease ([place])    a. 10/2004 s/p L CEA; b. 12/2015 Carotid U/S: RICA 1-39%; b. LICA patent CEA site.  . Closed right hip fracture ([place]) 09/20/2019  . Dysrhythmia   . GERD (gastroesophageal reflux disease)   . Hyperlipidemia   . Hypertension   . Osteoarthritis, multiple sites   . Osteopenia   . Persistent atrial fibrillation ([place])    a. Dx 08/2017; b. CHA2DS2VASc = 6-->Pradaxa; c. 09/2017 Successful DCCV (second shock - 200J); d. 10/2017 Recurrent Afib-->flecainide started 11/2017; e. 06/2020 s/p DCCV (200J x 1).  . Personal history of  radiation therapy 2001   left breast ca  . Psoriasis     Past Surgical History:  Procedure Laterality Date  . ABDOMINAL HYSTERECTOMY    . ANKLE FRACTURE SURGERY  4/08   left---hardware still in place  . 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 BREAST EXCISIONAL BIOPSY Left yrs ago   benign  . BREAST LUMPECTOMY Left 2001   f/u radiation  . CARDIOVERSION N/A 10/04/2017   Procedure: CARDIOVERSION;  Surgeon: Wellington Hampshire, MD;  Location: [facility];  Service: Cardiovascular;  Laterality: N/A;  . CARDIOVERSION N/A 12/16/2017   Procedure: CARDIOVERSION;  Surgeon: [name], MD;  Location: [facility];  Service: Cardiovascular;  Laterality: N/A;  . CARDIOVERSION N/A 06/24/2020   Procedure: CARDIOVERSION;  Surgeon: Wellington Hampshire, MD;  Location: [facility];  Service: Cardiovascular;  Laterality: N/A;  . CAROTID ENDARTERECTOMY Left 10/23/2004  . FRACTURE SURGERY    . OOPHORECTOMY    . SHOULDER SURGERY  6/07   left  . TONSILLECTOMY AND ADENOIDECTOMY    . TOTAL HIP ARTHROPLASTY  2004   right     Medications Prior to Admission: Prior to Admission medications   Medication Sig Start Date End Date Taking? Authorizing Provider  acetaminophen (TYLENOL) 325 MG tablet Take 2 tablets (650 mg total) by mouth every 4 (four) hours as needed for headache or mild pain. 09/19/20   Swayze, Ava, DO  allopurinol (ZYLOPRIM) 100 MG tablet Take 1 tablet (100 mg total)  by mouth daily. 08/08/20   [name], MD  Chlorhexidine Gluconate Cloth 2 % PADS Apply 6 each topically daily. 09/19/20   Swayze, Ava, DO  cholecalciferol (VITAMIN D) 25 MCG (1000 UNIT) tablet Take 2 tablets (2,000 Units total) by mouth daily. 09/20/20   Swayze, Ava, DO  dabigatran (PRADAXA) 150 MG CAPS capsule Take 1 capsule (150 mg total) by mouth 2 (two) times daily. 09/19/20   Swayze, Ava, DO  flecainide (TAMBOCOR) 50 MG tablet Take 1.5 tablets (75 mg total) by mouth 2 (two) times daily. 07/23/20   Theora Gianotti, NP  furosemide  (LASIX) 10 MG/ML injection Inject 4 mLs (40 mg total) into the vein every 12 (twelve) hours. 09/19/20   Swayze, Ava, DO  furosemide (LASIX) 40 MG tablet Take 20 mg by mouth daily.    [provider]  LORazepam (ATIVAN) 0.5 MG tablet Take 1 tablet (0.5 mg total) by mouth 2 (two) times daily as needed for anxiety. 09/19/20   Swayze, Ava, DO  magnesium sulfate 2 GM/50ML SOLN infusion Inject 50 mLs (2 g total) into the vein once for 1 dose. 09/19/20 09/19/20  Swayze, Ava, DO  metoprolol tartrate (LOPRESSOR) 50 MG tablet TAKE 1/2 TABLET (25 MG) BY MOUTH TWICE DAILY 07/17/20   [name], MD  mirtazapine (REMERON) 15 MG tablet Take 1 tablet (15 mg total) by mouth at bedtime. 09/19/20   Swayze, Ava, DO  Multiple Vitamin (MULTIVITAMIN) capsule Take 1 capsule by mouth daily.    [provider]  Multiple Vitamins-Minerals (PRESERVISION AREDS 2+MULTI VIT PO) Take 1 capsule by mouth in the morning and at bedtime. Patient not taking: Reported on 09/18/2020    [provider]  nitroGLYCERIN (NITROGLYN) 2 % ointment Apply 1 inch topically every 8 (eight) hours. 09/19/20   Swayze, Ava, DO  ondansetron (ZOFRAN) 4 MG/2ML SOLN injection Inject 2 mLs (4 mg total) into the vein every 6 (six) hours as needed for nausea. 09/19/20   Swayze, Ava, DO  PRADAXA 150 MG CAPS capsule TAKE 1 CAPSULE BY MOUTH TWICE A DAY 03/14/20   [name], MD     Allergies:    Allergies  Allergen Reactions  . Adhesive [Tape] Other (See Comments)    "Does not work well with me"    Social History:   Social History   Socioeconomic History  . Marital status: Widowed    Spouse name: Not on file  . Number of children: Not on file  . Years of education: Not on file  . Highest education level: Not on file  Occupational History  . Occupation: Pharmacologist    Comment: Retired  Tobacco Use  . Smoking status: Former Smoker    Packs/day: 1.00    Years: 40.00    Pack years: 40.00    Types: Cigarettes    Quit date:  07/21/1995    Years since quitting: 25.1  . Smokeless tobacco: Never Used  Vaping Use  . Vaping Use: Never used  Substance and Sexual Activity  . Alcohol use: Yes    Alcohol/week: 16.0 standard drinks    Types: 2 Glasses of wine, 14 Standard drinks or equivalent per week    Comment: daily 1-2  . Drug use: No  . Sexual activity: Never  Other Topics Concern  . Not on file  Social History Narrative   1 natural child   3 adopted children   Artist---still teaches water colors   Husband has Alzheimers  Has living will   Daughter Amy is health care POA   DNR    No tube feeds if cognitively unaware   Social Determinants of Health   Financial Resource Strain: Low Risk   . Difficulty of Paying Living Expenses: Not hard at all  Food Insecurity: No Food Insecurity  . Worried About Charity fundraiser in the Last Year: Never true  . Ran Out of Food in the Last Year: Never true  Transportation Needs: No Transportation Needs  . Lack of Transportation (Medical): No  . Lack of Transportation (Non-Medical): No  Physical Activity: Not on file  Stress: Not on file  Social Connections: Not on file  Intimate Partner Violence: Not on file     Family History:   The patient's family history includes Hypertension in her brother; Leukemia in her father; Pneumonia in her mother. There is no history of Heart disease, Diabetes, or Breast cancer.     Review of Systems: [y] = yes, [ ]  = no   . General: Weight gain [ ] ; Weight loss [ ] ; Anorexia [ ] ; Fatigue [ ] ; Fever [ ] ; Chills [ ] ; Weakness [ ]   . Cardiac: Chest pain/pressure [ ] ; Resting SOB [ ] ; Exertional SOB [Y]; Orthopnea [ ] ; Pedal Edema [ ] ; Palpitations [ ] ; Syncope [Y]; Presyncope [ ] ; Paroxysmal nocturnal dyspnea[ ]   . Pulmonary: Cough [ ] ; Wheezing[ ] ; Hemoptysis[ ] ; Sputum [ ] ; Snoring [ ]   . GI: Vomiting[ ] ; Dysphagia[ ] ; Melena[ ] ; Hematochezia [ ] ; Heartburn[ ] ; Abdominal pain [ ] ; Constipation [ ] ; Diarrhea [ ] ; BRBPR [ ]    . GU: Hematuria[ ] ; Dysuria [ ] ; Nocturia[ ]   . Vascular: Pain in legs with walking [ ] ; Pain in feet with lying flat [ ] ; Non-healing sores [ ] ; Stroke [ ] ; TIA [ ] ; Slurred speech [ ] ;  . Neuro: Headaches[ ] ; Vertigo[ ] ; Seizures[ ] ; Paresthesias[ ] ;Blurred vision [ ] ; Diplopia [ ] ; Vision changes [ ]   . Ortho/Skin: Arthritis [ ] ; Joint pain [ ] ; Muscle pain [ ] ; Joint swelling [ ] ; Back Pain [ ] ; Rash [ ]   . Psych: Depression[ ] ; Anxiety[ ]   . Heme: Bleeding problems [ ] ; Clotting disorders [ ] ; Anemia [ ]   . Endocrine: Diabetes [ ] ; Thyroid dysfunction[ ]      Physical Exam/Data:   Vitals:   09/19/20 2158 09/19/20 2200 09/19/20 2215 09/19/20 2230  BP:  125/73 (!) 104/54 (!) 114/59  Pulse:  (!) 55 (!) 42 (!) 49  Resp:  (!) 23 18 18   Temp: 98.5 F (36.9 C)     TempSrc: Oral     SpO2:  98% 97% 98%  Weight:       No intake or output data in the 24 hours ending 09/19/20 2247 Filed Weights   09/19/20 2154  Weight: 54.5 kg   Body mass index is 18.82 kg/m.  General:  Well nourished, well developed, in no acute distress HEENT: normal Lymph: no adenopathy Neck: no JVD, no carotid bruits Endocrine:  No thryomegaly Vascular: No carotid bruits; FA pulses 2+ bilaterally without bruits  Cardiac:  normal S1, S2; bradycardiac; no murmurs Lungs:  clear to auscultation bilaterally  Abd: soft, nontender, no hepatomegaly  Ext: no edema, some bruising on the arms Musculoskeletal:  No deformities, BUE and BLE strength normal and equal Skin: warm and dry  Neuro:  CNs 2-12 intact, no focal abnormalities noted Psych:  Normal affect    Laboratory Data:  Chemistry  Recent Labs  Lab 09/18/20 0815 09/19/20 0340  NA 140 136  K 4.8 3.7  CL 110 104  CO2 19* 24  GLUCOSE 86 86  BUN 27* 33*  CREATININE 1.01* 1.17*  CALCIUM 8.6* 8.5*  GFRNONAA 56* 47*  ANIONGAP 11 8    Recent Labs  Lab 09/18/20 0815  PROT 6.7  ALBUMIN 3.7  AST 46*  ALT 26  ALKPHOS 100  BILITOT 1.0    Hematology Recent Labs  Lab 09/18/20 0815  WBC 9.5  RBC 2.95*  HGB 10.8*  HCT 32.7*  MCV 110.8*  MCH 36.6*  MCHC 33.0  RDW 13.4  PLT 249   Cardiac EnzymesNo results for input(s): TROPONINI in the last 168 hours. No results for input(s): TROPIPOC in the last 168 hours.  BNP Recent Labs  Lab 09/18/20 0814  BNP 526.8*    DDimer No results for input(s): DDIMER in the last 168 hours.  Radiology/Studies:  ECHOCARDIOGRAM COMPLETE  Result Date: 09/19/2020    ECHOCARDIOGRAM REPORT   Patient Name:   [name] Date of Exam: 09/19/2020 Medical Rec #:  [id number]          Height:       66.0 in Accession #:    [id number]         Weight:       118.0 lb Date of Birth:  1941-06-19          BSA:          1.598 m Patient Age:    24 years           BP:           109/61 mmHg Patient Gender: F                  HR:           51 bpm. Exam Location:  [facility] Procedure: 2D Echo, Cardiac Doppler and Color Doppler Indications:     CHF- acute systolic S34.19  History:         Patient has prior history of Echocardiogram examinations, most                  recent 03/29/2020. Risk Factors:Dyslipidemia and Hypertension.  Sonographer:     Sherrie Sport RDCS (AE) Referring Phys:  [id number] AVA Benny Lennert Diagnosing Phys: Ida Rogue MD IMPRESSIONS  1. Left ventricular ejection fraction, by estimation, is 50 to 55%. The left ventricle has low normal function. The left ventricle has no regional wall motion abnormalities. There is mild left ventricular hypertrophy. Left ventricular diastolic parameters are indeterminate.  2. Right ventricular systolic function is moderately reduced. The right ventricular size is mildly enlarged.  3. Right atrial size was mildly dilated.  4. The inferior vena cava is dilated in size with <50% respiratory variability, suggesting right atrial pressure of 15 mmHg.  5. Bradycardia, rate 40 bpm  6. Left pleural effusion noted 3-4 cm FINDINGS  Left Ventricle: Left ventricular ejection fraction, by  estimation, is 50 to 55%. The left ventricle has low normal function. The left ventricle has no regional wall motion abnormalities. The left ventricular internal cavity size was normal in size. There is mild left ventricular hypertrophy. Left ventricular diastolic parameters are indeterminate. Right Ventricle: The right ventricular size is mildly enlarged. No increase in right ventricular wall thickness. Right ventricular systolic function is moderately reduced. Left Atrium: Left atrial size was normal in size. Right Atrium: Right atrial size was mildly dilated. Pericardium: There  is no evidence of pericardial effusion. Mitral Valve: The mitral valve is normal in structure. Mild mitral valve regurgitation. No evidence of mitral valve stenosis. Tricuspid Valve: The tricuspid valve is normal in structure. Tricuspid valve regurgitation is mild . No evidence of tricuspid stenosis. Aortic Valve: The aortic valve was not well visualized. Aortic valve regurgitation is not visualized. No aortic stenosis is present. Aortic valve mean gradient measures 2.0 mmHg. Aortic valve peak gradient measures 3.1 mmHg. Aortic valve area, by VTI measures 1.96 cm. Pulmonic Valve: The pulmonic valve was normal in structure. Pulmonic valve regurgitation is not visualized. No evidence of pulmonic stenosis. Aorta: The aortic root is normal in size and structure. Venous: The inferior vena cava is dilated in size with less than 50% respiratory variability, suggesting right atrial pressure of 15 mmHg. IAS/Shunts: No atrial level shunt detected by color flow Doppler.  LEFT VENTRICLE PLAX 2D LVIDd:         3.92 cm  Diastology LVIDs:         2.40 cm  LV e' medial:    4.24 cm/s LV PW:         1.32 cm  LV E/e' medial:  16.3 LV IVS:        0.90 cm  LV e' lateral:   8.38 cm/s LVOT diam:     2.00 cm  LV E/e' lateral: 8.2 LV SV:         33 LV SV Index:   20 LVOT Area:     3.14 cm  RIGHT VENTRICLE RV Basal diam:  3.83 cm RV S prime:     10.60 cm/s TAPSE  (M-mode): 3.4 cm LEFT ATRIUM           Index       RIGHT ATRIUM           Index LA diam:      3.40 cm 2.13 cm/m  RA Area:     23.80 cm LA Vol (A2C): 33.4 ml 20.90 ml/m RA Volume:   77.00 ml  48.18 ml/m LA Vol (A4C): 38.0 ml 23.78 ml/m  AORTIC VALVE                   PULMONIC VALVE AV Area (Vmax):    1.85 cm    PV Vmax:        0.41 m/s AV Area (Vmean):   1.88 cm    PV Peak grad:   0.7 mmHg AV Area (VTI):     1.96 cm    RVOT Peak grad: 1 mmHg AV Vmax:           87.50 cm/s AV Vmean:          62.000 cm/s AV VTI:            0.167 m AV Peak Grad:      3.1 mmHg AV Mean Grad:      2.0 mmHg LVOT Vmax:         51.60 cm/s LVOT Vmean:        37.200 cm/s LVOT VTI:          0.104 m LVOT/AV VTI ratio: 0.62  AORTA Ao Root diam: 3.03 cm MITRAL VALVE               TRICUSPID VALVE MV Area (PHT): 3.06 cm    TR Peak grad:   25.0 mmHg MV Decel Time: 248 msec    TR Vmax:        250.00 cm/s  MV E velocity: 69.00 cm/s MV A velocity: 20.60 cm/s  SHUNTS MV E/A ratio:  3.35        Systemic VTI:  0.10 m                            Systemic Diam: 2.00 cm Ida Rogue MD Electronically signed by Ida Rogue MD Signature Date/Time: 09/19/2020/8:02:00 PM    Final (Updated)     Assessment and Plan:   1. Recurrent wide-complex tachycardia   2.   Congestive heart failure   3.  Elevated troponin      Severity of Illness: {Observation/Inpatient:21159}   For questions or updates, please contact [facility] Please consult [email or web address] for contact info under Cardiology/STEMI.    Signed, Meade Maw, MD  09/19/2020 10:47 PM

## 2020-09-19 NOTE — ED Notes (Signed)
Covid swab recollected at this time due to not found in lab.

## 2020-09-19 NOTE — ED Notes (Signed)
Pt being weaned off O2 nasal cannula.

## 2020-09-19 NOTE — Progress Notes (Addendum)
1825 Patient in sustained vtach again with a pulse. Patient awake, talking.and diaphoretic. Patient cardioverted with 120 joules per ACLS guidelines. Patient now in SB with frequent PVCS and PACs. rate irregular. Patient alert and talking. Dr.Gollan and Dr. Mortimer Fries in to respond to v tach. Right hand- possible extravagation of Amnio/Dopamine looks much better without treatment.

## 2020-09-19 NOTE — Progress Notes (Signed)
1909 Patient flipped back into V.Tach at rate of 184. Patient stopped breathing this time but pulse remained. Patient stated she was dreaming she was up walking. Patient spontaneously started breathing on her own. Patient cardioverted with 120 joules and order received to start patient on Amiodarone drip without bolus. Oncoming nurse notified

## 2020-09-19 NOTE — ED Notes (Addendum)
Rockey Situ, MD cardiologist and Echo at bedside. Nitro paste removed per Rockey Situ, MD.

## 2020-09-19 NOTE — ED Notes (Signed)
Per Dr. Sidney Ace, pt to get 213mL bolus with 6am lasix held

## 2020-09-19 NOTE — Progress Notes (Addendum)
Contacted by nursing that patient was in persistent VT, 5:30 pm Was not breaking with Valsalva, carotid sinus massage On arrival she was pale, diaphoretic, supine, poor mentation Given hemodynamic instability, decision made to urgently cardiovert Pads placed, cardioverted 120 J x 1 Quickly converting to normal sinus rhythm/sinus bradycardia rate 50s  Case discussed with Dr. Caryl Comes, reviewed tele strips, Discussed etiology, concern for flecainide toxicity, Order placed to start bicarb infusion   Recurrent VT again at 6:25 pm Requiring DCCV 120 J x1 given by nursing mentation back to baseline post cardioversion, BP stable  Discussed with Dr. Caryl Comes, decision made to transfer to [facility] Per persistent VT despite flecainide washout,  Consideration of pacer/defib, use of antiarrhythmics for VT  Patient does not want [place] to call her son, prefers to wait until she is at [facility]  7:15 PM episode VT again requiring shock Discussed with Dr. Caryl Comes We will start amiodarone infusion without bolus  For current episodes potentially could bolus but would give over prolonged period time such as 30 or 60 minutes  May need temp wire for junctional bradycardia  Signed, Esmond Plants, MD, Ph.D [facility]

## 2020-09-19 NOTE — Progress Notes (Signed)
*  PRELIMINARY RESULTS* Echocardiogram 2D Echocardiogram has been performed.  Sherrie Sport 09/19/2020, 12:08 PM

## 2020-09-19 NOTE — ED Notes (Signed)
Pt found to be in V-tach.  MD notified.  Pads placed on patient.

## 2020-09-19 NOTE — ED Notes (Signed)
Patient talking on phone with son at this time.

## 2020-09-19 NOTE — Progress Notes (Addendum)
Upon admission to ICU the patient was noted to have had an infiltration of her Dopamine / Amiodorone IV. The patients right hand and fingers below and above the IV site are bruised and swollen. Text sent to her MD, Dr. Benny Lennert to order to treat infiltration. Policy reviewed. Warm compress applied. Area outlined per policy but patient states her hand always looks this way-she has arthritis. Consulted with Pharmacist Mirna Mires as to treatment modality. Will recheck area.

## 2020-09-19 NOTE — Progress Notes (Addendum)
1710 Patient requested bedpan. Placed on bedpan. Minutes later patients rythum changeds to v.tach with a pulse. Patient awake and alert. Talking with nurses. HR v tach 181-157 still with a pulse. Dr.Gollan paged. Patient cardioverted at 120 joules buy Dr. Rockey Situ and immediately converted to SB,Junctional? rate 40s-50s. Oxygen started at [facility]. B/P 121/72. Patient still awake and talking.

## 2020-09-19 NOTE — ED Notes (Signed)
Message sent to [place], DO about patient's HR and BP.

## 2020-09-19 NOTE — ED Notes (Signed)
Admission MD at this morning.

## 2020-09-19 NOTE — H&P (Signed)
Cardiology Admission History and Physical:   Patient ID: [name]; MRN: [id number]; DOB: 08-Apr-1941   Admission date: 09/19/2020  Primary Care Provider: [name], MD Primary Cardiologist: Virl Axe, MD   Chief Complaint:  Syncope (runs of wide complex tachycardia)  History of Present Illness:   [name] is a 80 y.o. female with a history of HFpEF, paroxysmal atrial fibrillation (on Flecainide & Pradaxa), left breast CA (s/p XRT),  ETOH use, frequent falls and hypertension, who presented with worsening shortness of breath and weight gain. Her lasix was discontinued 3 weeks prior to this hospitalization. She had a 10 lbs weight gain and increasing dyspnea after this. She presented to an outside hospital. BNP was 526, chest x-ray showed bilateral pleural effusions consistent with CHF.  EKG showed sinus rhythm, troponins were 57, 90.  She was diagnosed with CHF, given IV Lasix with adequate diuresing and subsequent improvement in symptoms.    While in the hospital the patient had episodes of wide-complex tachycardia.  She passed out during some of these events.  During 1 VT episode she was cardioverted with 120 J.  Converting back to sinus rhythm patient was bradycardic in the 50s.  There was a concern for flecainide toxicity and the patient was therefore started on a bicarbonate IV infusion.  Patient was also on IV amiodarone.  She has been transferred to [facility] for an EP evaluation +/- possible pacemaker placement.   Past Medical History:  Diagnosis Date  . (HFpEF) heart failure with preserved ejection fraction ([place])    a. 08/2017 Echo: EF 55-60%, no rwma, mild MR, mildly dil LA, nl RV fxn; b. 03/2020 Echo: EF 60-65%, no rwma, Gr2 DD. RVSP 44.80mmHg. Mod dil LA. Mild MR.  Marland Kitchen Acute CHF (congestive heart failure) ([place]) 09/18/2020  . Breast cancer ([place]) 2001   left breast  . Cancer ([place]) 2001   left breast ca  . Carotid arterial disease ([place])    a. 10/2004  s/p L CEA; b. 12/2015 Carotid U/S: RICA 1-39%; b. LICA patent CEA site.  . Closed right hip fracture ([place]) 09/20/2019  . Dysrhythmia   . GERD (gastroesophageal reflux disease)   . Hyperlipidemia   . Hypertension   . Osteoarthritis, multiple sites   . Osteopenia   . Persistent atrial fibrillation ([place])    a. Dx 08/2017; b. CHA2DS2VASc = 6-->Pradaxa; c. 09/2017 Successful DCCV (second shock - 200J); d. 10/2017 Recurrent Afib-->flecainide started 11/2017; e. 06/2020 s/p DCCV (200J x 1).  . Personal history of radiation therapy 2001   left breast ca  . Psoriasis     Past Surgical History:  Procedure Laterality Date  . ABDOMINAL HYSTERECTOMY    . ANKLE FRACTURE SURGERY  4/08   left---hardware still in place  . 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 BREAST EXCISIONAL BIOPSY Left yrs ago   benign  . BREAST LUMPECTOMY Left 2001   f/u radiation  . CARDIOVERSION N/A 10/04/2017   Procedure: CARDIOVERSION;  Surgeon: Wellington Hampshire, MD;  Location: [facility];  Service: Cardiovascular;  Laterality: N/A;  . CARDIOVERSION N/A 12/16/2017   Procedure: CARDIOVERSION;  Surgeon: [name], MD;  Location: [facility];  Service: Cardiovascular;  Laterality: N/A;  . CARDIOVERSION N/A 06/24/2020   Procedure: CARDIOVERSION;  Surgeon: Wellington Hampshire, MD;  Location: [facility];  Service: Cardiovascular;  Laterality: N/A;  . CAROTID ENDARTERECTOMY Left 10/23/2004  . FRACTURE SURGERY    . OOPHORECTOMY    .  SHOULDER SURGERY  6/07   left  . TONSILLECTOMY AND ADENOIDECTOMY    . TOTAL HIP ARTHROPLASTY  2004   right     Medications Prior to Admission: Prior to Admission medications   Medication Sig Start Date End Date Taking? Authorizing Provider  acetaminophen (TYLENOL) 325 MG tablet Take 2 tablets (650 mg total) by mouth every 4 (four) hours as needed for headache or mild pain. 09/19/20   Swayze, Ava, DO  allopurinol (ZYLOPRIM) 100 MG tablet Take 1 tablet (100 mg total) by mouth daily. 08/08/20   [name],  MD  Chlorhexidine Gluconate Cloth 2 % PADS Apply 6 each topically daily. 09/19/20   Swayze, Ava, DO  cholecalciferol (VITAMIN D) 25 MCG (1000 UNIT) tablet Take 2 tablets (2,000 Units total) by mouth daily. 09/20/20   Swayze, Ava, DO  dabigatran (PRADAXA) 150 MG CAPS capsule Take 1 capsule (150 mg total) by mouth 2 (two) times daily. 09/19/20   Swayze, Ava, DO  flecainide (TAMBOCOR) 50 MG tablet Take 1.5 tablets (75 mg total) by mouth 2 (two) times daily. 07/23/20   Theora Gianotti, NP  furosemide (LASIX) 10 MG/ML injection Inject 4 mLs (40 mg total) into the vein every 12 (twelve) hours. 09/19/20   Swayze, Ava, DO  furosemide (LASIX) 40 MG tablet Take 20 mg by mouth daily.    [provider]  LORazepam (ATIVAN) 0.5 MG tablet Take 1 tablet (0.5 mg total) by mouth 2 (two) times daily as needed for anxiety. 09/19/20   Swayze, Ava, DO  magnesium sulfate 2 GM/50ML SOLN infusion Inject 50 mLs (2 g total) into the vein once for 1 dose. 09/19/20 09/19/20  Swayze, Ava, DO  metoprolol tartrate (LOPRESSOR) 50 MG tablet TAKE 1/2 TABLET (25 MG) BY MOUTH TWICE DAILY 07/17/20   [name], MD  mirtazapine (REMERON) 15 MG tablet Take 1 tablet (15 mg total) by mouth at bedtime. 09/19/20   Swayze, Ava, DO  Multiple Vitamin (MULTIVITAMIN) capsule Take 1 capsule by mouth daily.    [provider]  Multiple Vitamins-Minerals (PRESERVISION AREDS 2+MULTI VIT PO) Take 1 capsule by mouth in the morning and at bedtime. Patient not taking: Reported on 09/18/2020    [provider]  nitroGLYCERIN (NITROGLYN) 2 % ointment Apply 1 inch topically every 8 (eight) hours. 09/19/20   Swayze, Ava, DO  ondansetron (ZOFRAN) 4 MG/2ML SOLN injection Inject 2 mLs (4 mg total) into the vein every 6 (six) hours as needed for nausea. 09/19/20   Swayze, Ava, DO  PRADAXA 150 MG CAPS capsule TAKE 1 CAPSULE BY MOUTH TWICE A DAY 03/14/20   [name], MD     Allergies:    Allergies  Allergen Reactions  . Adhesive [Tape]  Other (See Comments)    "Does not work well with me"    Social History:   Social History   Socioeconomic History  . Marital status: Widowed    Spouse name: Not on file  . Number of children: Not on file  . Years of education: Not on file  . Highest education level: Not on file  Occupational History  . Occupation: Pharmacologist    Comment: Retired  Tobacco Use  . Smoking status: Former Smoker    Packs/day: 1.00    Years: 40.00    Pack years: 40.00    Types: Cigarettes    Quit date: 07/21/1995    Years since quitting: 25.1  . Smokeless tobacco: Never Used  Vaping Use  . Vaping  Use: Never used  Substance and Sexual Activity  . Alcohol use: Yes    Alcohol/week: 16.0 standard drinks    Types: 2 Glasses of wine, 14 Standard drinks or equivalent per week    Comment: daily 1-2  . Drug use: No  . Sexual activity: Never  Other Topics Concern  . Not on file  Social History Narrative   1 natural child   3 adopted children   Artist---still teaches water colors   Husband has Alzheimers      Has living will   Daughter Amy is health care POA   DNR    No tube feeds if cognitively unaware   Social Determinants of Health   Financial Resource Strain: Low Risk   . Difficulty of Paying Living Expenses: Not hard at all  Food Insecurity: No Food Insecurity  . Worried About Charity fundraiser in the Last Year: Never true  . Ran Out of Food in the Last Year: Never true  Transportation Needs: No Transportation Needs  . Lack of Transportation (Medical): No  . Lack of Transportation (Non-Medical): No  Physical Activity: Not on file  Stress: Not on file  Social Connections: Not on file  Intimate Partner Violence: Not on file     Family History:   The patient's family history includes Hypertension in her brother; Leukemia in her father; Pneumonia in her mother. There is no history of Heart disease, Diabetes, or Breast cancer.     Review of Systems: [y] = yes, [ ]  = no   . General:  Weight gain [ ] ; Weight loss [ ] ; Anorexia [ ] ; Fatigue [ ] ; Fever [ ] ; Chills [ ] ; Weakness [ ]   . Cardiac: Chest pain/pressure [ ] ; Resting SOB [ ] ; Exertional SOB [Y]; Orthopnea [ ] ; Pedal Edema [ ] ; Palpitations [ ] ; Syncope [Y]; Presyncope [ ] ; Paroxysmal nocturnal dyspnea[ ]   . Pulmonary: Cough [ ] ; Wheezing[ ] ; Hemoptysis[ ] ; Sputum [ ] ; Snoring [ ]   . GI: Vomiting[ ] ; Dysphagia[ ] ; Melena[ ] ; Hematochezia [ ] ; Heartburn[ ] ; Abdominal pain [ ] ; Constipation [ ] ; Diarrhea [ ] ; BRBPR [ ]   . GU: Hematuria[ ] ; Dysuria [ ] ; Nocturia[ ]   . Vascular: Pain in legs with walking [ ] ; Pain in feet with lying flat [ ] ; Non-healing sores [ ] ; Stroke [ ] ; TIA [ ] ; Slurred speech [ ] ;  . Neuro: Headaches[ ] ; Vertigo[ ] ; Seizures[ ] ; Paresthesias[ ] ;Blurred vision [ ] ; Diplopia [ ] ; Vision changes [ ]   . Ortho/Skin: Arthritis [ ] ; Joint pain [ ] ; Muscle pain [ ] ; Joint swelling [ ] ; Back Pain [ ] ; Rash [ ]   . Psych: Depression[ ] ; Anxiety[ ]   . Heme: Bleeding problems [ ] ; Clotting disorders [ ] ; Anemia [ ]   . Endocrine: Diabetes [ ] ; Thyroid dysfunction[ ]      Physical Exam/Data:   Vitals:   09/19/20 2158 09/19/20 2200 09/19/20 2215 09/19/20 2230  BP:  125/73 (!) 104/54 (!) 114/59  Pulse:  (!) 55 (!) 42 (!) 49  Resp:  (!) 23 18 18   Temp: 98.5 F (36.9 C)     TempSrc: Oral     SpO2:  98% 97% 98%  Weight:       No intake or output data in the 24 hours ending 09/19/20 2247 Filed Weights   09/19/20 2154  Weight: 54.5 kg   Body mass index is 18.82 kg/m.  General:  Well nourished, well developed, in no acute distress HEENT:  normal Lymph: no adenopathy Neck: no JVD, no carotid bruits Endocrine:  No thryomegaly Vascular: No carotid bruits; FA pulses 2+ bilaterally without bruits  Cardiac:  normal S1, S2; bradycardiac; no murmurs Lungs:  clear to auscultation bilaterally  Abd: soft, nontender, no hepatomegaly  Ext: no edema, some bruising on the arms Musculoskeletal:  No deformities, BUE  and BLE strength normal and equal Skin: warm and dry  Neuro:  CNs 2-12 intact, no focal abnormalities noted Psych:  Normal affect    Laboratory Data:  Chemistry Recent Labs  Lab 09/18/20 0815 09/19/20 0340  NA 140 136  K 4.8 3.7  CL 110 104  CO2 19* 24  GLUCOSE 86 86  BUN 27* 33*  CREATININE 1.01* 1.17*  CALCIUM 8.6* 8.5*  GFRNONAA 56* 47*  ANIONGAP 11 8    Recent Labs  Lab 09/18/20 0815  PROT 6.7  ALBUMIN 3.7  AST 46*  ALT 26  ALKPHOS 100  BILITOT 1.0   Hematology Recent Labs  Lab 09/18/20 0815  WBC 9.5  RBC 2.95*  HGB 10.8*  HCT 32.7*  MCV 110.8*  MCH 36.6*  MCHC 33.0  RDW 13.4  PLT 249   Cardiac EnzymesNo results for input(s): TROPONINI in the last 168 hours. No results for input(s): TROPIPOC in the last 168 hours.  BNP Recent Labs  Lab 09/18/20 0814  BNP 526.8*    DDimer No results for input(s): DDIMER in the last 168 hours.  Radiology/Studies:  ECHOCARDIOGRAM COMPLETE  Result Date: 09/19/2020    ECHOCARDIOGRAM REPORT   Patient Name:   PEGGIE HORNAK Date of Exam: 09/19/2020 Medical Rec #:  [id number]          Height:       66.0 in Accession #:    [id number]         Weight:       118.0 lb Date of Birth:  16-Oct-1940          BSA:          1.598 m Patient Age:    28 years           BP:           109/61 mmHg Patient Gender: F                  HR:           51 bpm. Exam Location:  [facility] Procedure: 2D Echo, Cardiac Doppler and Color Doppler Indications:     CHF- acute systolic U13.24  History:         Patient has prior history of Echocardiogram examinations, most                  recent 03/29/2020. Risk Factors:Dyslipidemia and Hypertension.  Sonographer:     Sherrie Sport RDCS (AE) Referring Phys:  [id number] AVA Benny Lennert Diagnosing Phys: Ida Rogue MD IMPRESSIONS  1. Left ventricular ejection fraction, by estimation, is 50 to 55%. The left ventricle has low normal function. The left ventricle has no regional wall motion abnormalities. There is mild left ventricular  hypertrophy. Left ventricular diastolic parameters are indeterminate.  2. Right ventricular systolic function is moderately reduced. The right ventricular size is mildly enlarged.  3. Right atrial size was mildly dilated.  4. The inferior vena cava is dilated in size with <50% respiratory variability, suggesting right atrial pressure of 15 mmHg.  5. Bradycardia, rate 40 bpm  6. Left pleural effusion noted 3-4 cm FINDINGS  Left Ventricle:  Left ventricular ejection fraction, by estimation, is 50 to 55%. The left ventricle has low normal function. The left ventricle has no regional wall motion abnormalities. The left ventricular internal cavity size was normal in size. There is mild left ventricular hypertrophy. Left ventricular diastolic parameters are indeterminate. Right Ventricle: The right ventricular size is mildly enlarged. No increase in right ventricular wall thickness. Right ventricular systolic function is moderately reduced. Left Atrium: Left atrial size was normal in size. Right Atrium: Right atrial size was mildly dilated. Pericardium: There is no evidence of pericardial effusion. Mitral Valve: The mitral valve is normal in structure. Mild mitral valve regurgitation. No evidence of mitral valve stenosis. Tricuspid Valve: The tricuspid valve is normal in structure. Tricuspid valve regurgitation is mild . No evidence of tricuspid stenosis. Aortic Valve: The aortic valve was not well visualized. Aortic valve regurgitation is not visualized. No aortic stenosis is present. Aortic valve mean gradient measures 2.0 mmHg. Aortic valve peak gradient measures 3.1 mmHg. Aortic valve area, by VTI measures 1.96 cm. Pulmonic Valve: The pulmonic valve was normal in structure. Pulmonic valve regurgitation is not visualized. No evidence of pulmonic stenosis. Aorta: The aortic root is normal in size and structure. Venous: The inferior vena cava is dilated in size with less than 50% respiratory variability, suggesting right  atrial pressure of 15 mmHg. IAS/Shunts: No atrial level shunt detected by color flow Doppler.  LEFT VENTRICLE PLAX 2D LVIDd:         3.92 cm  Diastology LVIDs:         2.40 cm  LV e' medial:    4.24 cm/s LV PW:         1.32 cm  LV E/e' medial:  16.3 LV IVS:        0.90 cm  LV e' lateral:   8.38 cm/s LVOT diam:     2.00 cm  LV E/e' lateral: 8.2 LV SV:         33 LV SV Index:   20 LVOT Area:     3.14 cm  RIGHT VENTRICLE RV Basal diam:  3.83 cm RV S prime:     10.60 cm/s TAPSE (M-mode): 3.4 cm LEFT ATRIUM           Index       RIGHT ATRIUM           Index LA diam:      3.40 cm 2.13 cm/m  RA Area:     23.80 cm LA Vol (A2C): 33.4 ml 20.90 ml/m RA Volume:   77.00 ml  48.18 ml/m LA Vol (A4C): 38.0 ml 23.78 ml/m  AORTIC VALVE                   PULMONIC VALVE AV Area (Vmax):    1.85 cm    PV Vmax:        0.41 m/s AV Area (Vmean):   1.88 cm    PV Peak grad:   0.7 mmHg AV Area (VTI):     1.96 cm    RVOT Peak grad: 1 mmHg AV Vmax:           87.50 cm/s AV Vmean:          62.000 cm/s AV VTI:            0.167 m AV Peak Grad:      3.1 mmHg AV Mean Grad:      2.0 mmHg LVOT Vmax:  51.60 cm/s LVOT Vmean:        37.200 cm/s LVOT VTI:          0.104 m LVOT/AV VTI ratio: 0.62  AORTA Ao Root diam: 3.03 cm MITRAL VALVE               TRICUSPID VALVE MV Area (PHT): 3.06 cm    TR Peak grad:   25.0 mmHg MV Decel Time: 248 msec    TR Vmax:        250.00 cm/s MV E velocity: 69.00 cm/s MV A velocity: 20.60 cm/s  SHUNTS MV E/A ratio:  3.35        Systemic VTI:  0.10 m                            Systemic Diam: 2.00 cm Ida Rogue MD Electronically signed by Ida Rogue MD Signature Date/Time: 09/19/2020/8:02:00 PM    Final (Updated)     Assessment and Plan:   1. Recurrent wide-complex tachycardia Most likely to be ventricular tachycardia.  There is a concern for flecainide toxicity.  We will therefore continue IV bicarbonate infusion.  She will also continue on amiodarone.  Holding Pradaxa in case patient needs pacemaker  placement in the morning.  2. Congestive heart failure Patient has a history of HFpEF.  Continue strict I&O's and daily weights.  Lasix PRN.  3. Elevated troponin Likely secondary to supply demand mismatch and not true ACS.     Severity of Illness: The appropriate patient status for this patient is INPATIENT. Inpatient status is judged to be reasonable and necessary in order to provide the required intensity of service to ensure the patient's safety. The patient's presenting symptoms, physical exam findings, and initial radiographic and laboratory data in the context of their chronic comorbidities is felt to place them at high risk for further clinical deterioration. Furthermore, it is not anticipated that the patient will be medically stable for discharge from the hospital within 2 midnights of admission. The following factors support the patient status of inpatient.   " The patient's presenting symptoms include syncope. " The worrisome physical exam findings include NA. " The initial radiographic and laboratory data are worrisome because of abnormal ECG. " The chronic co-morbidities include CHF.   * I certify that at the point of admission it is my clinical judgment that the patient will require inpatient hospital care spanning beyond 2 midnights from the point of admission due to high intensity of service, high risk for further deterioration and high frequency of surveillance required.*    For questions or updates, please contact [place] Please consult [email or web address] for contact info under Cardiology/STEMI.    Signed, Meade Maw, MD  09/19/2020 10:47 PM

## 2020-09-19 NOTE — Progress Notes (Signed)
Asked to review strips for wide complex tachycardia events x2 this am treated with amio assoc withslowing and narrowing and bradycardia and BP the second treated with a shock  On review I wonder and have looked at with GT who is open to the hypothesis that this may represent flecianid effect on an SVT   Not sure but also unlikely that she should have VT  For control of arrhythmia will need an alternative Anti-arrhythmic drug prob amio and she is willing to be paced  Started to have a discussion re code status  She is not interested in prolonged resuscitation or being a vegetable but is open to reasonable interventions.  This conversation got interrupted by a recurrent tach event and will have more discussion          She may have taken extra and will take flecainide

## 2020-09-19 NOTE — Discharge Summary (Signed)
Physician Discharge Summary  [name]:[id number] DOB: 09/07/1940 DOA: 09/18/2020  PCP: [name], MD  Admit date: 09/18/2020 Discharge date: 09/19/2020  Recommendations for Outpatient Follow-up:  1. Transfer to Cardiovascular ICU at [facility] under the service of Cardiology.  Discharge Diagnoses: Principal diagnosis is #1 1. Sick Sinus syndrome 2. Hypotension 3. Bradycardia/VT 4. Acute on chronic CHF 5. Acute hypoxic respiratory failure 6. Anemia of chronic disease 7. Hypertension 8. Anxiety and Depression 9. Elevated troponins  Discharge Condition: Serious  Disposition: [facility], Cardiovascular ICU  Diet recommendation: NPO after midnight  Filed Weights   09/18/20 0808 09/19/20 1320  Weight: 53.5 kg 53.3 kg    History of present illness:  [name] is a 80 y.o. female with medical history significant for chronic diastolic dysfunction CHF, history of left breast cancer status post radiation therapy, paroxysmal atrial fibrillation, hypertension, dyslipidemia and GERD who presents to the ER via EMS for evaluation of progressively worsening shortness of breath over the last 5 days but worse on the morning of her admission.  Patient states that she has had shortness of breath with exertion but over the last 2 days she is now short of breath at rest.  Per EMS she had room air pulse oximetry of 82% and was placed on 4 L of oxygen with improvement in her pulse oximetry to 95%.  She also received aspirin 325 mg in the field as well as an inch of Nitropaste.  Patient admits to a 10 pound weight gain in the last 1 week. Per patient her Lasix was recently discontinued because of complaints of dry mouth and itching at night and this was thought to be related to her diuretic therapy.  Since then she has had increased dry mouth and increased thirst and has been drinking a lot of 7-Up soda's. She complains of pain involving her right shoulder and extending to the right  side of her neck but denies having any palpitations, no diaphoresis, no nausea, no vomiting, no orthopnea, no lower extremity swelling, no dizziness, no lightheadedness, no nocturia, no dysuria, no abdominal pain, no diarrhea, no constipation, no fever, no chills, no cough. Labs show sodium 140, potassium 4.8, chloride 110, bicarb 19, glucose 86, BUN 27, creatinine 1.01, calcium 8.6, alkaline phosphatase 100, albumin 3.7, AST 46 ALT 26 total protein 6.7, BNP 526, troponin 13 >> 57, white count 9.5, hemoglobin 10.8, hematocrit 32.7, MCV 110, RDW 13.4, platelet count 249, PT 17.5, INR 1.5 Chest x-ray reviewed by me shows stable mild cardiomegaly. Interstitial opacity with Kerley lines and small pleural effusions.  Twelve-lead EKG reviewed by me shows sinus rhythm with prolonged PR   ED Course: Patient is an 80 year old female with a history of chronic diastolic dysfunction CHF who presents to the ER for evaluation of worsening shortness of breath.  Imaging is suggestive of CHF exacerbation and patient has elevated BNP levels.  Her diuretic was recently discontinued due to concerns for possible " allergic reaction".  She has had a 10 pound weight gain in the last 1 week.  She received a dose of Lasix in the ER as well as a dose of enalaprilat.  She will be admitted to the hospital for further evaluation.   Hospital Course:  The patient was roomed in the ED overnight while awaiting a bed upstairs. The patient had received lasix 40 mg IV overnight and was satuating in the 90's on room air when I visited her in the morning. She stated that she  felt fine. I explained to her that we had to wait for echocardiogram to be performed, because we had to know why she was in CHF.  Within two hours I acknowledged a secure chat that had been sent 15 minutes prior that the patient had become bradycardic and hypotensive. CPR was initiated, but the patient pushed them away and told them to stop. Cardiology was called as  they had already been consulted and had already seen the patient. She was given low dose dopamine. This resulted in VT with a rate in the 170's. She was then given amiodarone and her heart rate dropped to 38. The patient was transferred to the ICU. Again at 1730 the patient developed VT with HR in the 170's. The patient stated that when this happened everything goes dark, and she cannot see anything. She was shocked.   In the ED the patient had extravasation of dopamine into the right hand. Orders for extravasation protocol were placed.  Dr. Rockey Situ has discussed the patient with Dr. Jens Som. The patient will be transferred to [facility] Cardiovascular ICU to the service of Dr. Julieanne Manson for pacemaker placement that will allow for treatment of her VT with rate limiting agents. The patient has agreed to the procedure.   Echocardiogram was obtained.  Today's assessment: S: The patient is lying quietly in the bed. She denies chest pain, palpitations, shortness of breath.  O: Vitals:  Vitals:   09/19/20 1800 09/19/20 1815  BP: 131/66 (!) 121/97  Pulse: 64 62  Resp: (!) 27 19  Temp:    SpO2: 100% 98%   Exam:  Constitutional:  . The patient is awake, alert, and oriented x 3. No acute distress. She appears pale and frail. Respiratory:  . No increased work of breathing. . No wheezes, rales, or rhonchi . No tactile fremitus Cardiovascular:  . Irregularly irregular, Bradycardic, junctional . No murmurs, ectopy, or gallups. . No lateral PMI. No thrills. Abdomen:  . Abdomen is soft, non-tender, non-distended . No hernias, masses, or organomegaly . Normoactive bowel sounds.  Musculoskeletal:  . No cyanosis, clubbing, or edema . Right hand where extravasation occurred, has been marked. There is not pain, tenderness, warmth or erythema in that hand. The hand and fingers are swollen, but the patient states that it is always like that.  Skin:  . No rashes, lesions, ulcers . palpation of skin: no  induration or nodules Neurologic:  . CN 2-12 intact . Sensation all 4 extremities intact Psychiatric:  . Mental status o Mood, affect appropriate o Orientation to person, place, time  . judgment and insight appear intact  Discharge Instructions   Allergies as of 09/19/2020      Reactions   Adhesive [tape] Other (See Comments)   "Does not work well with me"      Medication List    STOP taking these medications   Vitamin D3 25 MCG (1000 UT) Caps Replaced by: cholecalciferol 25 MCG (1000 UNIT) tablet     TAKE these medications   acetaminophen 325 MG tablet Commonly known as: TYLENOL Take 2 tablets (650 mg total) by mouth every 4 (four) hours as needed for headache or mild pain.   allopurinol 100 MG tablet Commonly known as: ZYLOPRIM Take 1 tablet (100 mg total) by mouth daily.   Chlorhexidine Gluconate Cloth 2 % Pads Apply 6 each topically daily.   cholecalciferol 25 MCG (1000 UNIT) tablet Commonly known as: VITAMIN D Take 2 tablets (2,000 Units total) by mouth daily. Start taking on:  September 20, 2020 Replaces: Vitamin D3 25 MCG (1000 UT) Caps   flecainide 50 MG tablet Commonly known as: TAMBOCOR Take 1.5 tablets (75 mg total) by mouth 2 (two) times daily.   furosemide 40 MG tablet Commonly known as: LASIX Take 20 mg by mouth daily. What changed: Another medication with the same name was added. Make sure you understand how and when to take each.   furosemide 10 MG/ML injection Commonly known as: LASIX Inject 4 mLs (40 mg total) into the vein every 12 (twelve) hours. What changed: You were already taking a medication with the same name, and this prescription was added. Make sure you understand how and when to take each.   LORazepam 0.5 MG tablet Commonly known as: ATIVAN Take 1 tablet (0.5 mg total) by mouth 2 (two) times daily as needed for anxiety. What changed: See the new instructions.   magnesium sulfate 2 GM/50ML Soln infusion Inject 50 mLs (2 g total)  into the vein once for 1 dose.   metoprolol tartrate 50 MG tablet Commonly known as: LOPRESSOR TAKE 1/2 TABLET (25 MG) BY MOUTH TWICE DAILY   mirtazapine 15 MG tablet Commonly known as: REMERON Take 1 tablet (15 mg total) by mouth at bedtime. What changed: See the new instructions.   multivitamin capsule Take 1 capsule by mouth daily.   nitroGLYCERIN 2 % ointment Commonly known as: NITROGLYN Apply 1 inch topically every 8 (eight) hours.   ondansetron 4 MG/2ML Soln injection Commonly known as: ZOFRAN Inject 2 mLs (4 mg total) into the vein every 6 (six) hours as needed for nausea.   Pradaxa 150 MG Caps capsule Generic drug: dabigatran TAKE 1 CAPSULE BY MOUTH TWICE A DAY What changed: Another medication with the same name was added. Make sure you understand how and when to take each.   dabigatran 150 MG Caps capsule Commonly known as: Pradaxa Take 1 capsule (150 mg total) by mouth 2 (two) times daily. What changed: You were already taking a medication with the same name, and this prescription was added. Make sure you understand how and when to take each.   PRESERVISION AREDS 2+MULTI VIT PO Take 1 capsule by mouth in the morning and at bedtime.         Allergies  Allergen Reactions  . Adhesive [Tape] Other (See Comments)    "Does not work well with me"    The results of significant diagnostics from this hospitalization (including imaging, microbiology, ancillary and laboratory) are listed below for reference.    Significant Diagnostic Studies: DG Chest Portable 1 View  Result Date: 09/18/2020 CLINICAL DATA:  Suspected CHF.  Shortness of breath EXAM: PORTABLE CHEST 1 VIEW COMPARISON:  09/10/2017 FINDINGS: Stable mild cardiomegaly. Interstitial opacity with Kerley lines and small pleural effusions. Possible chronic hyperinflation. Stable aortic and hilar contours. No pneumothorax. IMPRESSION: CHF. Electronically Signed   By: Monte Fantasia M.D.   On: 09/18/2020 08:37     Microbiology: Recent Results (from the past 240 hour(s))  SARS CORONAVIRUS 2 (TAT 6-24 HRS) Nasopharyngeal Nasopharyngeal Swab     Status: None   Collection Time: 09/18/20 11:52 PM   Specimen: Nasopharyngeal Swab  Result Value Ref Range Status   SARS Coronavirus 2 NEGATIVE NEGATIVE Final    Comment: (NOTE) SARS-CoV-2 target nucleic acids are NOT DETECTED.  The SARS-CoV-2 RNA is generally detectable in upper and lower respiratory specimens during the acute phase of infection. Negative results do not preclude SARS-CoV-2 infection, do not rule out co-infections with  other pathogens, and should not be used as the sole basis for treatment or other patient management decisions. Negative results must be combined with clinical observations, patient history, and epidemiological information. The expected result is Negative.  Fact Sheet for Patients: SugarRoll.be  Fact Sheet for Healthcare Providers: https://www.woods-mathews.com/  This test is not yet approved or cleared by the [place] FDA and  has been authorized for detection and/or diagnosis of SARS-CoV-2 by FDA under an Emergency Use Authorization (EUA). This EUA will remain  in effect (meaning this test can be used) for the duration of the COVID-19 declaration under Se ction 564(b)(1) of the Act, 21 U.S.C. section 360bbb-3(b)(1), unless the authorization is terminated or revoked sooner.  Performed at [facility], [place] [address] [place], [place] [address]   MRSA PCR Screening     Status: None   Collection Time: 09/19/20  1:32 PM   Specimen: Nasopharyngeal  Result Value Ref Range Status   MRSA by PCR NEGATIVE NEGATIVE Final    Comment:        The GeneXpert MRSA Assay (FDA approved for NASAL specimens only), is one component of a comprehensive MRSA colonization surveillance program. It is not intended to diagnose MRSA infection nor to guide or monitor treatment  for MRSA infections. Performed at [facility], [address] [place], [place] [address]      Labs: Basic Metabolic Panel: Recent Labs  Lab 09/18/20 0815 09/19/20 0340  NA 140 136  K 4.8 3.7  CL 110 104  CO2 19* 24  GLUCOSE 86 86  BUN 27* 33*  CREATININE 1.01* 1.17*  CALCIUM 8.6* 8.5*   Liver Function Tests: Recent Labs  Lab 09/18/20 0815  AST 46*  ALT 26  ALKPHOS 100  BILITOT 1.0  PROT 6.7  ALBUMIN 3.7   No results for input(s): LIPASE, AMYLASE in the last 168 hours. No results for input(s): AMMONIA in the last 168 hours. CBC: Recent Labs  Lab 09/18/20 0815  WBC 9.5  HGB 10.8*  HCT 32.7*  MCV 110.8*  PLT 249   Cardiac Enzymes: No results for input(s): CKTOTAL, CKMB, CKMBINDEX, TROPONINI in the last 168 hours. BNP: BNP (last 3 results) Recent Labs    09/18/20 0814  BNP 526.8*    ProBNP (last 3 results) No results for input(s): PROBNP in the last 8760 hours.  CBG: Recent Labs  Lab 09/19/20 1316  GLUCAP 117*    Principal Problem:   Acute CHF (congestive heart failure) (HCC) Active Problems:   GERD (gastroesophageal reflux disease)   Hypertension   Anemia of chronic disease   Atrial fibrillation (HCC)   Chronic kidney disease, stage 3, mod decreased GFR (HCC)   Troponin level elevated   CHF (congestive heart failure) (HCC)   Arrhythmia   VT (ventricular tachycardia) ([place])   Time coordinating discharge: 46 minutes.  Signed:        Agamjot Kilgallon, DO [facility]  09/19/2020, 7:22 PM

## 2020-09-20 ENCOUNTER — Encounter (HOSPITAL_COMMUNITY)
Admission: AD | Disposition: A | Discharge status: Home / Self Care | Payer: Self-pay | Source: Other Acute Inpatient Hospital | Attending: Internal Medicine

## 2020-09-20 LAB — BASIC METABOLIC PANEL
Anion gap: 13 (ref 5–15)
Anion gap: 8 (ref 5–15)
BUN: 25 mg/dL — ABNORMAL HIGH (ref 8–23)
BUN: 30 mg/dL — ABNORMAL HIGH (ref 8–23)
CO2: 25 mmol/L (ref 22–32)
CO2: 26 mmol/L (ref 22–32)
Calcium: 8.6 mg/dL — ABNORMAL LOW (ref 8.9–10.3)
Calcium: 8.9 mg/dL (ref 8.9–10.3)
Chloride: 100 mmol/L (ref 98–111)
Chloride: 103 mmol/L (ref 98–111)
Creatinine, Ser: 1.16 mg/dL — ABNORMAL HIGH (ref 0.44–1.00)
Creatinine, Ser: 1.27 mg/dL — ABNORMAL HIGH (ref 0.44–1.00)
GFR, Estimated: 42 mL/min — ABNORMAL LOW (ref >60)
GFR, Estimated: 47 mL/min — ABNORMAL LOW (ref >60)
Glucose, Bld: 100 mg/dL — ABNORMAL HIGH (ref 70–99)
Glucose, Bld: 153 mg/dL — ABNORMAL HIGH (ref 70–99)
Potassium: 3.5 mmol/L (ref 3.5–5.1)
Potassium: 4.4 mmol/L (ref 3.5–5.1)
Sodium: 137 mmol/L (ref 135–145)
Sodium: 138 mmol/L (ref 135–145)

## 2020-09-20 LAB — CBC
HCT: 30.4 % — ABNORMAL LOW (ref 36.0–46.0)
HCT: 33.2 % — ABNORMAL LOW (ref 36.0–46.0)
Hemoglobin: 10.6 g/dL — ABNORMAL LOW (ref 12.0–15.0)
Hemoglobin: 11 g/dL — ABNORMAL LOW (ref 12.0–15.0)
MCH: 36.7 pg — ABNORMAL HIGH (ref 26.0–34.0)
MCH: 38.3 pg — ABNORMAL HIGH (ref 26.0–34.0)
MCHC: 33.1 g/dL (ref 30.0–36.0)
MCHC: 34.9 g/dL (ref 30.0–36.0)
MCV: 109.7 fL — ABNORMAL HIGH (ref 80.0–100.0)
MCV: 110.7 fL — ABNORMAL HIGH (ref 80.0–100.0)
Platelets: 236 10*3/uL (ref 150–400)
Platelets: 237 10*3/uL (ref 150–400)
RBC: 2.77 MIL/uL — ABNORMAL LOW (ref 3.87–5.11)
RBC: 3 MIL/uL — ABNORMAL LOW (ref 3.87–5.11)
RDW: 13.6 % (ref 11.5–15.5)
RDW: 13.8 % (ref 11.5–15.5)
WBC: 7.8 10*3/uL (ref 4.0–10.5)
WBC: 9.6 10*3/uL (ref 4.0–10.5)
nRBC: 0 % (ref 0.0–0.2)
nRBC: 0 % (ref 0.0–0.2)

## 2020-09-20 LAB — MRSA PCR SCREENING: MRSA by PCR: NEGATIVE

## 2020-09-20 LAB — APTT: aPTT: 28 seconds (ref 24–36)

## 2020-09-20 LAB — BLOOD GAS, VENOUS
Acid-base deficit: 0 mmol/L (ref 0.0–2.0)
Bicarbonate: 25.4 mmol/L (ref 20.0–28.0)
Drawn by: 5979
O2 Saturation: 54.9 %
Patient temperature: 37
pCO2, Ven: 51.5 mmHg (ref 44.0–60.0)
pH, Ven: 7.314 (ref 7.250–7.430)
pO2, Ven: 35.5 mmHg (ref 32.0–45.0)

## 2020-09-20 LAB — TSH: TSH: 2.138 u[IU]/mL (ref 0.350–4.500)

## 2020-09-20 LAB — PHOSPHORUS: Phosphorus: 1.7 mg/dL — ABNORMAL LOW (ref 2.5–4.6)

## 2020-09-20 LAB — MAGNESIUM
Magnesium: 1.9 mg/dL (ref 1.7–2.4)
Magnesium: 2.3 mg/dL (ref 1.7–2.4)

## 2020-09-20 SURGERY — PACEMAKER IMPLANT

## 2020-09-20 MED ORDER — LORAZEPAM 2 MG/ML IJ SOLN: 1.0000 mg | INTRAMUSCULAR | Status: DC | PRN

## 2020-09-20 MED ORDER — THIAMINE HCL 100 MG/ML IJ SOLN: 100.0000 mg | Freq: Every day | INTRAMUSCULAR | Status: DC

## 2020-09-20 MED ORDER — LORAZEPAM 1 MG PO TABS
1.0000 mg | ORAL_TABLET | ORAL | Status: DC | PRN
Start: 2020-09-20 — End: 2020-09-22

## 2020-09-20 MED ORDER — THIAMINE HCL 100 MG PO TABS
100.0000 mg | ORAL_TABLET | Freq: Every day | ORAL | Status: DC
  Administered 2020-09-20 – 2020-09-22 (×3): 100 mg via ORAL
  Filled 2020-09-20 (×3): qty 1

## 2020-09-20 MED ORDER — DABIGATRAN ETEXILATE MESYLATE 150 MG PO CAPS
150.0000 mg | ORAL_CAPSULE | Freq: Two times a day (BID) | ORAL | Status: DC
  Administered 2020-09-20 – 2020-09-22 (×5): 150 mg via ORAL
  Filled 2020-09-20 (×8): qty 1

## 2020-09-20 MED ORDER — MIRTAZAPINE 15 MG PO TABS
15.0000 mg | ORAL_TABLET | Freq: Every day | ORAL | Status: DC
  Administered 2020-09-20 – 2020-09-21 (×2): 15 mg via ORAL
  Filled 2020-09-20 (×3): qty 1

## 2020-09-20 MED ORDER — ADULT MULTIVITAMIN W/MINERALS CH
1.0000 | ORAL_TABLET | Freq: Every day | ORAL | Status: DC
  Administered 2020-09-20 – 2020-09-22 (×3): 1 via ORAL
  Filled 2020-09-20 (×3): qty 1

## 2020-09-20 MED ORDER — LORAZEPAM 0.5 MG PO TABS
0.5000 mg | ORAL_TABLET | Freq: Two times a day (BID) | ORAL | Status: DC | PRN
  Administered 2020-09-20 – 2020-09-21 (×2): 0.5 mg via ORAL
  Filled 2020-09-20 (×2): qty 1

## 2020-09-20 MED ORDER — POTASSIUM CHLORIDE CRYS ER 20 MEQ PO TBCR
40.0000 meq | EXTENDED_RELEASE_TABLET | Freq: Once | ORAL | Status: AC
  Administered 2020-09-20: 40 meq via ORAL
  Filled 2020-09-20: qty 2

## 2020-09-20 MED ORDER — FOLIC ACID 1 MG PO TABS
1.0000 mg | ORAL_TABLET | Freq: Every day | ORAL | Status: DC
  Administered 2020-09-20 – 2020-09-22 (×3): 1 mg via ORAL
  Filled 2020-09-20 (×3): qty 1

## 2020-09-20 NOTE — Consult Note (Addendum)
ELECTROPHYSIOLOGY CONSULT NOTE  Patient ID: [name], MRN: [id number], DOB/AGE: Nov 03, 1940 80 y.o. Admit date: 09/19/2020 Date of Consult: 09/20/2020  Primary Physician: [name], MD Primary Cardiologist: [name] is a 80 y.o. female who is being seen today for the evaluation of WCT  at the request of Dr Roselyn Bering    Chief Complaint: WCT   HPI [name] is a 80 y.o. female is transferred from [facility] where she had presented with worsening shortness of breath and weight gain following discontinuation of her diuretics.  Chest x-ray and laboratories were consistent with acute on chronic congestive heart failure in the setting of near normal LV function.  While in hospital she had wide-complex tachycardia.  The first was treated with amiodarone with gradual slowing and then discontinuation.  This was followed by hypotension and bradycardia.  The next episode was treated with a shock.  Recurrent episodes were also treated with shock.  Between the second and third shock, I was contacted and telemetry was reviewed.  A very very wide QRS was noted, there was some narrowing with slowing of heart rate and the possibility of class Ic effect was raised.  Strips were reviewed with Dr. Elliot Cousin who thought that this was a reasonable hypothesis in this lady who had near normal LV function less likely to have ventricular tachycardia.  She was then alkalized and recurrent tachycardia, a photograph from telemetry of which I was able to see but no twelve-lead electrocardiogram was obtained, demonstrated a tachycardia that was considerably narrower although the cycle length was only about 60 ms longer.  This again was supportive of the hypothesis of 1C effect with atrial arrhythmia.  It was elected to put her on amiodarone and transfer her for the possibility of backup bradycardia pacing given the prior history of bradycardia.  She is an avid alcohol drinker. Past Medical  History:  Diagnosis Date  . (HFpEF) heart failure with preserved ejection fraction ([place])    a. 08/2017 Echo: EF 55-60%, no rwma, mild MR, mildly dil LA, nl RV fxn; b. 03/2020 Echo: EF 60-65%, no rwma, Gr2 DD. RVSP 44.46mmHg. Mod dil LA. Mild MR.  Marland Kitchen Acute CHF (congestive heart failure) ([place]) 09/18/2020  . Breast cancer ([place]) 2001   left breast  . Cancer ([place]) 2001   left breast ca  . Carotid arterial disease ([place])    a. 10/2004 s/p L CEA; b. 12/2015 Carotid U/S: RICA 1-39%; b. LICA patent CEA site.  . Closed right hip fracture ([place]) 09/20/2019  . Dysrhythmia   . GERD (gastroesophageal reflux disease)   . Hyperlipidemia   . Hypertension   . Osteoarthritis, multiple sites   . Osteopenia   . Persistent atrial fibrillation ([place])    a. Dx 08/2017; b. CHA2DS2VASc = 6-->Pradaxa; c. 09/2017 Successful DCCV (second shock - 200J); d. 10/2017 Recurrent Afib-->flecainide started 11/2017; e. 06/2020 s/p DCCV (200J x 1).  . Personal history of radiation therapy 2001   left breast ca  . Psoriasis       Surgical History:  Past Surgical History:  Procedure Laterality Date  . ABDOMINAL HYSTERECTOMY    . ANKLE FRACTURE SURGERY  4/08   left---hardware still in place  . 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 BREAST EXCISIONAL BIOPSY Left yrs ago   benign  . BREAST LUMPECTOMY Left 2001   f/u radiation  . CARDIOVERSION N/A 10/04/2017   Procedure: CARDIOVERSION;  Surgeon: Wellington Hampshire,  MD;  Location: [facility];  Service: Cardiovascular;  Laterality: N/A;  . CARDIOVERSION N/A 12/16/2017   Procedure: CARDIOVERSION;  Surgeon: [name], MD;  Location: [facility];  Service: Cardiovascular;  Laterality: N/A;  . CARDIOVERSION N/A 06/24/2020   Procedure: CARDIOVERSION;  Surgeon: Wellington Hampshire, MD;  Location: [facility];  Service: Cardiovascular;  Laterality: N/A;  . CAROTID ENDARTERECTOMY Left 10/23/2004  . FRACTURE SURGERY    . OOPHORECTOMY    . SHOULDER SURGERY  6/07   left  . TONSILLECTOMY AND  ADENOIDECTOMY    . TOTAL HIP ARTHROPLASTY  2004   right     Home Meds: Prior to Admission medications   Medication Sig Start Date End Date Taking? Authorizing Provider  acetaminophen (TYLENOL) 325 MG tablet Take 2 tablets (650 mg total) by mouth every 4 (four) hours as needed for headache or mild pain. 09/19/20   Swayze, Ava, DO  allopurinol (ZYLOPRIM) 100 MG tablet Take 1 tablet (100 mg total) by mouth daily. 08/08/20   [name], MD  Chlorhexidine Gluconate Cloth 2 % PADS Apply 6 each topically daily. 09/19/20   Swayze, Ava, DO  cholecalciferol (VITAMIN D) 25 MCG (1000 UNIT) tablet Take 2 tablets (2,000 Units total) by mouth daily. 09/20/20   Swayze, Ava, DO  dabigatran (PRADAXA) 150 MG CAPS capsule Take 1 capsule (150 mg total) by mouth 2 (two) times daily. 09/19/20   Swayze, Ava, DO  furosemide (LASIX) 10 MG/ML injection Inject 4 mLs (40 mg total) into the vein every 12 (twelve) hours. 09/19/20   Swayze, Ava, DO  furosemide (LASIX) 40 MG tablet Take 20 mg by mouth daily.    [provider]  LORazepam (ATIVAN) 0.5 MG tablet Take 1 tablet (0.5 mg total) by mouth 2 (two) times daily as needed for anxiety. 09/19/20   Swayze, Ava, DO  mirtazapine (REMERON) 15 MG tablet Take 1 tablet (15 mg total) by mouth at bedtime. 09/19/20   Swayze, Ava, DO  Multiple Vitamin (MULTIVITAMIN) capsule Take 1 capsule by mouth daily.    [provider]  Multiple Vitamins-Minerals (PRESERVISION AREDS 2+MULTI VIT PO) Take 1 capsule by mouth in the morning and at bedtime. Patient not taking: Reported on 09/18/2020    [provider]  ondansetron (ZOFRAN) 4 MG/2ML SOLN injection Inject 2 mLs (4 mg total) into the vein every 6 (six) hours as needed for nausea. 09/19/20   Swayze, Ava, DO  PRADAXA 150 MG CAPS capsule TAKE 1 CAPSULE BY MOUTH TWICE A DAY 03/14/20   [name], MD    Inpatient Medications:  . Chlorhexidine Gluconate Cloth  6 each Topical Daily  . dabigatran  150 mg Oral BID  . folic  acid  1 mg Oral Daily  . mirtazapine  15 mg Oral QHS  . multivitamin with minerals  1 tablet Oral Daily  . thiamine  100 mg Oral Daily   Or  . thiamine  100 mg Intravenous Daily      Allergies:  Allergies  Allergen Reactions  . Adhesive [Tape] Other (See Comments)    "Does not work well with me"    Social History   Socioeconomic History  . Marital status: Widowed    Spouse name: Not on file  . Number of children: Not on file  . Years of education: Not on file  . Highest education level: Not on file  Occupational History  . Occupation: Pharmacologist    Comment: Retired  Tobacco Use  . Smoking status:  Former Smoker    Packs/day: 1.00    Years: 40.00    Pack years: 40.00    Types: Cigarettes    Quit date: 07/21/1995    Years since quitting: 25.1  . Smokeless tobacco: Never Used  Vaping Use  . Vaping Use: Never used  Substance and Sexual Activity  . Alcohol use: Yes    Alcohol/week: 16.0 standard drinks    Types: 2 Glasses of wine, 14 Standard drinks or equivalent per week    Comment: daily 1-2  . Drug use: No  . Sexual activity: Never  Other Topics Concern  . Not on file  Social History Narrative   1 natural child   3 adopted children   Artist---still teaches water colors   Husband has Alzheimers      Has living will   Daughter Amy is health care POA   DNR    No tube feeds if cognitively unaware   Social Determinants of Health   Financial Resource Strain: Low Risk   . Difficulty of Paying Living Expenses: Not hard at all  Food Insecurity: No Food Insecurity  . Worried About Charity fundraiser in the Last Year: Never true  . Ran Out of Food in the Last Year: Never true  Transportation Needs: No Transportation Needs  . Lack of Transportation (Medical): No  . Lack of Transportation (Non-Medical): No  Physical Activity: Not on file  Stress: Not on file  Social Connections: Not on file  Intimate Partner Violence: Not on file     Family History  Problem  Relation Age of Onset  . Hypertension Brother   . Pneumonia Mother   . Leukemia Father   . Heart disease Neg Hx   . Diabetes Neg Hx   . Breast cancer Neg Hx          ROS:  Please see the history of present illness.     All other systems reviewed and negative.    Physical Exam:  Blood pressure (!) 136/58, pulse 56, temperature (!) 97.4 F (36.3 C), temperature source Oral, resp. rate 17, weight 54.5 kg, SpO2 92 %. General: Well developed, well nourished female in no acute distress. Head: Normocephalic, atraumatic, sclera non-icteric, no xanthomas, nares are without discharge. EENT: normal Lymph Nodes:  none Back: without scoliosis/kyphosis , no CVA tendersness Neck: Negative for carotid bruits. JVD not elevated. Lungs: Clear bilaterally to auscultation without wheezes, rales, or rhonchi. Breathing is unlabored. Heart: RRR with S1 S2. No  murmur , rubs, or gallops appreciated. Abdomen: Soft, non-tender, non-distended with normoactive bowel sounds. No hepatomegaly. No rebound/guarding. No obvious abdominal masses. Msk:  Strength and tone appear normal for age. Extremities: No clubbing or cyanosis.  No lower extremity edema but swelling of her left hand likely related to IV infiltration  .  Distal pedal pulses are 2+ and equal bilaterally. Skin: Warm and Dry Neuro: Alert and oriented X 3. CN III-XII intact Grossly normal sensory and motor function . Psych:  Responds to questions appropriately with a normal affect.      Labs: Cardiac Enzymes No results for input(s): CKTOTAL, CKMB, TROPONINI in the last 72 hours. CBC Lab Results  Component Value Date   WBC 7.8 09/20/2020   HGB 11.0 (L) 09/20/2020   HCT 33.2 (L) 09/20/2020   MCV 110.7 (H) 09/20/2020   PLT 236 09/20/2020   PROTIME: Recent Labs    09/18/20 0815  LABPROT 17.5*  INR 1.5*   Chemistry  Recent Labs  Lab 09/18/20 0815 09/19/20 0340 09/20/20 1102  NA 140   < > 138  K 4.8   < > 3.5  CL 110   < > 100  CO2  19*   < > 25  BUN 27*   < > 25*  CREATININE 1.01*   < > 1.16*  CALCIUM 8.6*   < > 8.9  PROT 6.7  --   --   BILITOT 1.0  --   --   ALKPHOS 100  --   --   ALT 26  --   --   AST 46*  --   --   GLUCOSE 86   < > 100*   < > = values in this interval not displayed.   Lipids Lab Results  Component Value Date   CHOL 216 (H) 01/11/2019   HDL 88.90 01/11/2019   LDLCALC 104 (H) 01/11/2019   TRIG 116.0 01/11/2019   BNP No results found for: PROBNP Thyroid Function Tests: Recent Labs    09/20/20 1102  TSH 2.138      Miscellaneous No results found for: DDIMER  Radiology/Studies:  DG Chest Portable 1 View  Result Date: 09/18/2020 CLINICAL DATA:  Suspected CHF.  Shortness of breath EXAM: PORTABLE CHEST 1 VIEW COMPARISON:  09/10/2017 FINDINGS: Stable mild cardiomegaly. Interstitial opacity with Kerley lines and small pleural effusions. Possible chronic hyperinflation. Stable aortic and hilar contours. No pneumothorax. IMPRESSION: CHF. Electronically Signed   By: Monte Fantasia M.D.   On: 09/18/2020 08:37   ECHOCARDIOGRAM COMPLETE  Result Date: 09/19/2020    ECHOCARDIOGRAM REPORT   Patient Name:   [name] Date of Exam: 09/19/2020 Medical Rec #:  [id number]          Height:       66.0 in Accession #:    [id number]         Weight:       118.0 lb Date of Birth:  September 09, 1940          BSA:          1.598 m Patient Age:    35 years           BP:           109/61 mmHg Patient Gender: F                  HR:           51 bpm. Exam Location:  [facility] Procedure: 2D Echo, Cardiac Doppler and Color Doppler Indications:     CHF- acute systolic G26.94  History:         Patient has prior history of Echocardiogram examinations, most                  recent 03/29/2020. Risk Factors:Dyslipidemia and Hypertension.  Sonographer:     Sherrie Sport RDCS (AE) Referring Phys:  [id number] AVA Benny Lennert Diagnosing Phys: Ida Rogue MD IMPRESSIONS  1. Left ventricular ejection fraction, by estimation, is 50 to 55%. The left  ventricle has low normal function. The left ventricle has no regional wall motion abnormalities. There is mild left ventricular hypertrophy. Left ventricular diastolic parameters are indeterminate.  2. Right ventricular systolic function is moderately reduced. The right ventricular size is mildly enlarged.  3. Right atrial size was mildly dilated.  4. The inferior vena cava is dilated in size with <50% respiratory variability, suggesting right atrial pressure of 15 mmHg.  5. Bradycardia, rate 40 bpm  6. Left pleural  effusion noted 3-4 cm FINDINGS  Left Ventricle: Left ventricular ejection fraction, by estimation, is 50 to 55%. The left ventricle has low normal function. The left ventricle has no regional wall motion abnormalities. The left ventricular internal cavity size was normal in size. There is mild left ventricular hypertrophy. Left ventricular diastolic parameters are indeterminate. Right Ventricle: The right ventricular size is mildly enlarged. No increase in right ventricular wall thickness. Right ventricular systolic function is moderately reduced. Left Atrium: Left atrial size was normal in size. Right Atrium: Right atrial size was mildly dilated. Pericardium: There is no evidence of pericardial effusion. Mitral Valve: The mitral valve is normal in structure. Mild mitral valve regurgitation. No evidence of mitral valve stenosis. Tricuspid Valve: The tricuspid valve is normal in structure. Tricuspid valve regurgitation is mild . No evidence of tricuspid stenosis. Aortic Valve: The aortic valve was not well visualized. Aortic valve regurgitation is not visualized. No aortic stenosis is present. Aortic valve mean gradient measures 2.0 mmHg. Aortic valve peak gradient measures 3.1 mmHg. Aortic valve area, by VTI measures 1.96 cm. Pulmonic Valve: The pulmonic valve was normal in structure. Pulmonic valve regurgitation is not visualized. No evidence of pulmonic stenosis. Aorta: The aortic root is normal in  size and structure. Venous: The inferior vena cava is dilated in size with less than 50% respiratory variability, suggesting right atrial pressure of 15 mmHg. IAS/Shunts: No atrial level shunt detected by color flow Doppler.  LEFT VENTRICLE PLAX 2D LVIDd:         3.92 cm  Diastology LVIDs:         2.40 cm  LV e' medial:    4.24 cm/s LV PW:         1.32 cm  LV E/e' medial:  16.3 LV IVS:        0.90 cm  LV e' lateral:   8.38 cm/s LVOT diam:     2.00 cm  LV E/e' lateral: 8.2 LV SV:         33 LV SV Index:   20 LVOT Area:     3.14 cm  RIGHT VENTRICLE RV Basal diam:  3.83 cm RV S prime:     10.60 cm/s TAPSE (M-mode): 3.4 cm LEFT ATRIUM           Index       RIGHT ATRIUM           Index LA diam:      3.40 cm 2.13 cm/m  RA Area:     23.80 cm LA Vol (A2C): 33.4 ml 20.90 ml/m RA Volume:   77.00 ml  48.18 ml/m LA Vol (A4C): 38.0 ml 23.78 ml/m  AORTIC VALVE                   PULMONIC VALVE AV Area (Vmax):    1.85 cm    PV Vmax:        0.41 m/s AV Area (Vmean):   1.88 cm    PV Peak grad:   0.7 mmHg AV Area (VTI):     1.96 cm    RVOT Peak grad: 1 mmHg AV Vmax:           87.50 cm/s AV Vmean:          62.000 cm/s AV VTI:            0.167 m AV Peak Grad:      3.1 mmHg AV Mean Grad:      2.0 mmHg LVOT  Vmax:         51.60 cm/s LVOT Vmean:        37.200 cm/s LVOT VTI:          0.104 m LVOT/AV VTI ratio: 0.62  AORTA Ao Root diam: 3.03 cm MITRAL VALVE               TRICUSPID VALVE MV Area (PHT): 3.06 cm    TR Peak grad:   25.0 mmHg MV Decel Time: 248 msec    TR Vmax:        250.00 cm/s MV E velocity: 69.00 cm/s MV A velocity: 20.60 cm/s  SHUNTS MV E/A ratio:  3.35        Systemic VTI:  0.10 m                            Systemic Diam: 2.00 cm Ida Rogue MD Electronically signed by Ida Rogue MD Signature Date/Time: 09/19/2020/8:02:00 PM    Final (Updated)     EKG: 2018 hrs. sinus rhythm at 64 Intervals 24/12/44 with a prominent U wave 1800 hrs. sine wave wide-complex tachycardia cycle length 360 ms   Telemetry as  outlined previously  Impression and recommendations Wide complex tachycardia  Presumed SVT with 1C effect  Atrial fibrillation-persistent with a rapid rate  Sinus brady  Alcohol use/ (As above)   Anemia   Hypertension   Orthostatic hypotension  End-of-life discussion   Pt with wCT and a sine wave like pattern with some BP and some narrowing as the HR slowed suggested 1C effect and then the 1949 v 1959 tracing and the picture of recurrent tachycardia at about 2000hr which was no longer sine but about the same rate support the hypothesis--echo EF about 50 making VT less likely  Surprisingly her bradycardia has not been severely aggravated by amiodarone so will hold off at this juncture and observe--will stop her metoprolol, needed adjunctively for her flecainide  Stop HCO3 this am, Amiodarone IV overnight and then po in am Discharge Sunday/monday depending on HR - brady and tachy  Have discussed CODE status with patient and her daughter Aimee She does not want prolonged intubation but would like resuscitation for acute issues Will rescind her DNR   Watch for ETOH withdrawal.

## 2020-09-21 ENCOUNTER — Other Ambulatory Visit: Payer: Self-pay

## 2020-09-21 LAB — COMPREHENSIVE METABOLIC PANEL
ALT: 49 U/L — ABNORMAL HIGH (ref 0–44)
AST: 44 U/L — ABNORMAL HIGH (ref 15–41)
Albumin: 2.8 g/dL — ABNORMAL LOW (ref 3.5–5.0)
Alkaline Phosphatase: 162 U/L — ABNORMAL HIGH (ref 38–126)
Anion gap: 9 (ref 5–15)
BUN: 22 mg/dL (ref 8–23)
CO2: 26 mmol/L (ref 22–32)
Calcium: 8.5 mg/dL — ABNORMAL LOW (ref 8.9–10.3)
Chloride: 101 mmol/L (ref 98–111)
Creatinine, Ser: 1.14 mg/dL — ABNORMAL HIGH (ref 0.44–1.00)
GFR, Estimated: 48 mL/min — ABNORMAL LOW (ref >60)
Glucose, Bld: 98 mg/dL (ref 70–99)
Potassium: 3.9 mmol/L (ref 3.5–5.1)
Sodium: 136 mmol/L (ref 135–145)
Total Bilirubin: 0.5 mg/dL (ref 0.3–1.2)
Total Protein: 5.4 g/dL — ABNORMAL LOW (ref 6.5–8.1)

## 2020-09-21 MED ORDER — AMIODARONE HCL 200 MG PO TABS
400.0000 mg | ORAL_TABLET | Freq: Two times a day (BID) | ORAL | Status: DC
  Administered 2020-09-21 – 2020-09-22 (×3): 400 mg via ORAL
  Filled 2020-09-21 (×3): qty 2

## 2020-09-21 NOTE — Progress Notes (Signed)
Progress Note  Patient Name: [name] Date of Encounter: 09/21/2020  [facility] Cardiologist: Virl Axe, MD   Subjective   Currently feeling well.  No chest pain or shortness of breath.  Inpatient Medications    Scheduled Meds: . Chlorhexidine Gluconate Cloth  6 each Topical Daily  . dabigatran  150 mg Oral BID  . folic acid  1 mg Oral Daily  . mirtazapine  15 mg Oral QHS  . multivitamin with minerals  1 tablet Oral Daily  . thiamine  100 mg Oral Daily   Or  . thiamine  100 mg Intravenous Daily   Continuous Infusions:  PRN Meds: acetaminophen, LORazepam **OR** LORazepam, LORazepam, ondansetron (ZOFRAN) IV   Vital Signs    Vitals:   09/21/20 0500 09/21/20 0600 09/21/20 0700 09/21/20 0802  BP: (!) 85/61 114/65 (!) 140/91   Pulse: (!) 56 (!) 55 63   Resp: 15 14 16    Temp:    98.4 F (36.9 C)  TempSrc:    Oral  SpO2: 94% 96% 96%   Weight: 53.4 kg       Intake/Output Summary (Last 24 hours) at 09/21/2020 0845 Last data filed at 09/21/2020 0600 Gross per 24 hour  Intake 520.63 ml  Output 400 ml  Net 120.63 ml   Last 3 Weights 09/21/2020 09/20/2020 09/19/2020  Weight (lbs) 117 lb 11.6 oz 120 lb 2.4 oz 120 lb 2.4 oz  Weight (kg) 53.4 kg 54.5 kg 54.5 kg      Telemetry    Sinus rhythm- Personally Reviewed  ECG    No new- Personally Reviewed  Physical Exam   GEN: No acute distress.   Neck: No JVD Cardiac: RRR, no murmurs, rubs, or gallops.  Respiratory: Clear to auscultation bilaterally. GI: Soft, nontender, non-distended  MS: No edema; No deformity. Neuro:  Nonfocal  Psych: Normal affect   Labs    High Sensitivity Troponin:   Recent Labs  Lab 09/18/20 0815 09/18/20 0958 09/18/20 1225 09/19/20 0340  TROPONINIHS 13 57* 90* 68*      Chemistry Recent Labs  Lab 09/18/20 0815 09/19/20 0340 09/19/20 2353 09/20/20 1102 09/21/20 0052  NA 140   < > 137 138 136  K 4.8   < > 4.4 3.5 3.9  CL 110   < > 103 100 101  CO2 19*   < > 26 25  26   GLUCOSE 86   < > 153* 100* 98  BUN 27*   < > 30* 25* 22  CREATININE 1.01*   < > 1.27* 1.16* 1.14*  CALCIUM 8.6*   < > 8.6* 8.9 8.5*  PROT 6.7  --   --   --  5.4*  ALBUMIN 3.7  --   --   --  2.8*  AST 46*  --   --   --  44*  ALT 26  --   --   --  49*  ALKPHOS 100  --   --   --  162*  BILITOT 1.0  --   --   --  0.5  GFRNONAA 56*   < > 42* 47* 48*  ANIONGAP 11   < > 8 13 9    < > = values in this interval not displayed.     Hematology Recent Labs  Lab 09/18/20 0815 09/19/20 2353 09/20/20 1102  WBC 9.5 9.6 7.8  RBC 2.95* 2.77* 3.00*  HGB 10.8* 10.6* 11.0*  HCT 32.7* 30.4* 33.2*  MCV 110.8* 109.7* 110.7*  MCH 36.6* 38.3* 36.7*  MCHC 33.0 34.9 33.1  RDW 13.4 13.6 13.8  PLT 249 237 236    BNP Recent Labs  Lab 09/18/20 0814  BNP 526.8*     DDimer No results for input(s): DDIMER in the last 168 hours.   Radiology    ECHOCARDIOGRAM COMPLETE  Result Date: 09/19/2020    ECHOCARDIOGRAM REPORT   Patient Name:   [name] Date of Exam: 09/19/2020 Medical Rec #:  [id number]          Height:       66.0 in Accession #:    [id number]         Weight:       118.0 lb Date of Birth:  [birth year]          BSA:          1.598 m Patient Age:    80 years           BP:           109/61 mmHg Patient Gender: F                  HR:           51 bpm. Exam Location:  [facility] Procedure: 2D Echo, Cardiac Doppler and Color Doppler Indications:     CHF- acute systolic H47.42  History:         Patient has prior history of Echocardiogram examinations, most                  recent 03/29/2020. Risk Factors:Dyslipidemia and Hypertension.  Sonographer:     Sherrie Sport RDCS (AE) Referring Phys:  [id number] AVA Benny Lennert Diagnosing Phys: Ida Rogue MD IMPRESSIONS  1. Left ventricular ejection fraction, by estimation, is 50 to 55%. The left ventricle has low normal function. The left ventricle has no regional wall motion abnormalities. There is mild left ventricular hypertrophy. Left ventricular diastolic parameters  are indeterminate.  2. Right ventricular systolic function is moderately reduced. The right ventricular size is mildly enlarged.  3. Right atrial size was mildly dilated.  4. The inferior vena cava is dilated in size with <50% respiratory variability, suggesting right atrial pressure of 15 mmHg.  5. Bradycardia, rate 40 bpm  6. Left pleural effusion noted 3-4 cm FINDINGS  Left Ventricle: Left ventricular ejection fraction, by estimation, is 50 to 55%. The left ventricle has low normal function. The left ventricle has no regional wall motion abnormalities. The left ventricular internal cavity size was normal in size. There is mild left ventricular hypertrophy. Left ventricular diastolic parameters are indeterminate. Right Ventricle: The right ventricular size is mildly enlarged. No increase in right ventricular wall thickness. Right ventricular systolic function is moderately reduced. Left Atrium: Left atrial size was normal in size. Right Atrium: Right atrial size was mildly dilated. Pericardium: There is no evidence of pericardial effusion. Mitral Valve: The mitral valve is normal in structure. Mild mitral valve regurgitation. No evidence of mitral valve stenosis. Tricuspid Valve: The tricuspid valve is normal in structure. Tricuspid valve regurgitation is mild . No evidence of tricuspid stenosis. Aortic Valve: The aortic valve was not well visualized. Aortic valve regurgitation is not visualized. No aortic stenosis is present. Aortic valve mean gradient measures 2.0 mmHg. Aortic valve peak gradient measures 3.1 mmHg. Aortic valve area, by VTI measures 1.96 cm. Pulmonic Valve: The pulmonic valve was normal in structure. Pulmonic valve regurgitation is not visualized. No evidence of pulmonic stenosis. Aorta: The aortic  root is normal in size and structure. Venous: The inferior vena cava is dilated in size with less than 50% respiratory variability, suggesting right atrial pressure of 15 mmHg. IAS/Shunts: No atrial  level shunt detected by color flow Doppler.  LEFT VENTRICLE PLAX 2D LVIDd:         3.92 cm  Diastology LVIDs:         2.40 cm  LV e' medial:    4.24 cm/s LV PW:         1.32 cm  LV E/e' medial:  16.3 LV IVS:        0.90 cm  LV e' lateral:   8.38 cm/s LVOT diam:     2.00 cm  LV E/e' lateral: 8.2 LV SV:         33 LV SV Index:   20 LVOT Area:     3.14 cm  RIGHT VENTRICLE RV Basal diam:  3.83 cm RV S prime:     10.60 cm/s TAPSE (M-mode): 3.4 cm LEFT ATRIUM           Index       RIGHT ATRIUM           Index LA diam:      3.40 cm 2.13 cm/m  RA Area:     23.80 cm LA Vol (A2C): 33.4 ml 20.90 ml/m RA Volume:   77.00 ml  48.18 ml/m LA Vol (A4C): 38.0 ml 23.78 ml/m  AORTIC VALVE                   PULMONIC VALVE AV Area (Vmax):    1.85 cm    PV Vmax:        0.41 m/s AV Area (Vmean):   1.88 cm    PV Peak grad:   0.7 mmHg AV Area (VTI):     1.96 cm    RVOT Peak grad: 1 mmHg AV Vmax:           87.50 cm/s AV Vmean:          62.000 cm/s AV VTI:            0.167 m AV Peak Grad:      3.1 mmHg AV Mean Grad:      2.0 mmHg LVOT Vmax:         51.60 cm/s LVOT Vmean:        37.200 cm/s LVOT VTI:          0.104 m LVOT/AV VTI ratio: 0.62  AORTA Ao Root diam: 3.03 cm MITRAL VALVE               TRICUSPID VALVE MV Area (PHT): 3.06 cm    TR Peak grad:   25.0 mmHg MV Decel Time: 248 msec    TR Vmax:        250.00 cm/s MV E velocity: 69.00 cm/s MV A velocity: 20.60 cm/s  SHUNTS MV E/A ratio:  3.35        Systemic VTI:  0.10 m                            Systemic Diam: 2.00 cm Ida Rogue MD Electronically signed by Ida Rogue MD Signature Date/Time: 09/19/2020/8:02:00 PM    Final (Updated)     Cardiac Studies   TTE 09/19/20 1. Left ventricular ejection fraction, by estimation, is 50 to 55%. The  left ventricle has low normal function. The left ventricle  has no regional  wall motion abnormalities. There is mild left ventricular hypertrophy.  Left ventricular diastolic  parameters are indeterminate.  2. Right ventricular  systolic function is moderately reduced. The right  ventricular size is mildly enlarged.  3. Right atrial size was mildly dilated.  4. The inferior vena cava is dilated in size with <50% respiratory  variability, suggesting right atrial pressure of 15 mmHg.  5. Bradycardia, rate 40 bpm  6. Left pleural effusion noted 3-4 cm   Patient Profile     80 y.o. female who presented to [facility] with wide-complex tachycardia, thought to due to flecainide toxicity  Assessment & Plan    1.  Wide-complex tachycardia: Potentially in SVT with a class Ic antiarrhythmic effect as she was on flecainide.  She is currently on IV amiodarone.  She has had some bradycardia, though this seems to have resolved.  We will switch her from IV amiodarone to p.o. amiodarone.  If she has no further bradycardia, likely discharge tomorrow.  2.  Hypertension: Blood pressure mildly elevated today.  We will continue to monitor.  Medication adjustments likely as an outpatient if necessary.  For questions or updates, please contact [place] Please consult [email or web address] for contact info under        Signed, Will Meredith Leeds, MD  09/21/2020, 8:46 AM

## 2020-09-21 NOTE — Discharge Instructions (Signed)

## 2020-09-22 DIAGNOSIS — R001 Bradycardia, unspecified: Secondary | ICD-10-CM

## 2020-09-22 LAB — COMPREHENSIVE METABOLIC PANEL
ALT: 35 U/L (ref 0–44)
AST: 24 U/L (ref 15–41)
Albumin: 2.9 g/dL — ABNORMAL LOW (ref 3.5–5.0)
Alkaline Phosphatase: 140 U/L — ABNORMAL HIGH (ref 38–126)
Anion gap: 11 (ref 5–15)
BUN: 14 mg/dL (ref 8–23)
CO2: 24 mmol/L (ref 22–32)
Calcium: 9.2 mg/dL (ref 8.9–10.3)
Chloride: 101 mmol/L (ref 98–111)
Creatinine, Ser: 1.08 mg/dL — ABNORMAL HIGH (ref 0.44–1.00)
GFR, Estimated: 52 mL/min — ABNORMAL LOW (ref >60)
Glucose, Bld: 89 mg/dL (ref 70–99)
Potassium: 4 mmol/L (ref 3.5–5.1)
Sodium: 136 mmol/L (ref 135–145)
Total Bilirubin: 0.8 mg/dL (ref 0.3–1.2)
Total Protein: 5.8 g/dL — ABNORMAL LOW (ref 6.5–8.1)

## 2020-09-22 MED ORDER — AMLODIPINE BESYLATE 5 MG PO TABS: 5.0000 mg | ORAL_TABLET | Freq: Every day | ORAL | 2 refills | Status: DC

## 2020-09-22 MED ORDER — MAGNESIUM SULFATE 2 GM/50ML IV SOLN: 2.0000 g | Freq: Once | INTRAVENOUS | 0 refills | Status: DC

## 2020-09-22 MED ORDER — AMIODARONE HCL 400 MG PO TABS: 400.0000 mg | ORAL_TABLET | Freq: Two times a day (BID) | ORAL | 0 refills | Status: DC

## 2020-09-22 MED ORDER — AMIODARONE HCL 200 MG PO TABS: 200.0000 mg | ORAL_TABLET | Freq: Every day | ORAL | Status: DC

## 2020-09-22 MED ORDER — AMIODARONE HCL 200 MG PO TABS: 200.0000 mg | ORAL_TABLET | Freq: Every day | ORAL | 2 refills | Status: DC

## 2020-09-22 MED ORDER — AMLODIPINE BESYLATE 5 MG PO TABS
5.0000 mg | ORAL_TABLET | Freq: Every day | ORAL | Status: DC
  Administered 2020-09-22: 5 mg via ORAL
  Filled 2020-09-22: qty 1

## 2020-09-22 NOTE — Progress Notes (Signed)
Progress Note  Patient Name: [name] Date of Encounter: 09/22/2020  [facility] Cardiologist: Virl Axe, MD   Subjective   Currently feeling well without complaint.  Inpatient Medications    Scheduled Meds: . amiodarone  400 mg Oral BID  . dabigatran  150 mg Oral BID  . folic acid  1 mg Oral Daily  . mirtazapine  15 mg Oral QHS  . multivitamin with minerals  1 tablet Oral Daily  . thiamine  100 mg Oral Daily   Or  . thiamine  100 mg Intravenous Daily   Continuous Infusions:  PRN Meds: acetaminophen, LORazepam **OR** LORazepam, LORazepam, ondansetron (ZOFRAN) IV   Vital Signs    Vitals:   09/21/20 1000 09/21/20 1251 09/21/20 1310 09/22/20 0405  BP: (!) 165/75 (!) 158/65 (!) 132/47 (!) 165/88  Pulse: (!) 53 77 68 88  Resp: 17 18 18 16   Temp:  (!) 97.5 F (36.4 C) (!) 97.5 F (36.4 C) 99.1 F (37.3 C)  TempSrc:  Oral Oral Oral  SpO2: 100%   98%  Weight:   57.2 kg   Height:   5\' 7"  (1.702 m)     Intake/Output Summary (Last 24 hours) at 09/22/2020 0844 Last data filed at 09/21/2020 1300 Gross per 24 hour  Intake 211.19 ml  Output 350 ml  Net -138.81 ml   Last 3 Weights 09/21/2020 09/21/2020 09/20/2020  Weight (lbs) 126 lb 1.7 oz 117 lb 11.6 oz 120 lb 2.4 oz  Weight (kg) 57.2 kg 53.4 kg 54.5 kg      Telemetry    Sinus rhythm- Personally Reviewed  ECG    No new- Personally Reviewed  Physical Exam   GEN: Well nourished, well developed, in no acute distress  HEENT: normal  Neck: no JVD, carotid bruits, or masses Cardiac: RRR; no murmurs, rubs, or gallops,no edema  Respiratory:  clear to auscultation bilaterally, normal work of breathing GI: soft, nontender, nondistended, + BS MS: no deformity or atrophy  Skin: warm and dry Neuro:  Strength and sensation are intact Psych: euthymic mood, full affect   Labs    High Sensitivity Troponin:   Recent Labs  Lab 09/18/20 0815 09/18/20 0958 09/18/20 1225 09/19/20 0340  TROPONINIHS 13 57*  90* 68*      Chemistry Recent Labs  Lab 09/18/20 0815 09/19/20 0340 09/20/20 1102 09/21/20 0052 09/22/20 0147  NA 140   < > 138 136 136  K 4.8   < > 3.5 3.9 4.0  CL 110   < > 100 101 101  CO2 19*   < > 25 26 24   GLUCOSE 86   < > 100* 98 89  BUN 27*   < > 25* 22 14  CREATININE 1.01*   < > 1.16* 1.14* 1.08*  CALCIUM 8.6*   < > 8.9 8.5* 9.2  PROT 6.7  --   --  5.4* 5.8*  ALBUMIN 3.7  --   --  2.8* 2.9*  AST 46*  --   --  44* 24  ALT 26  --   --  49* 35  ALKPHOS 100  --   --  162* 140*  BILITOT 1.0  --   --  0.5 0.8  GFRNONAA 56*   < > 47* 48* 52*  ANIONGAP 11   < > 13 9 11    < > = values in this interval not displayed.     Hematology Recent Labs  Lab 09/18/20 0815 09/19/20 2353 09/20/20  1102  WBC 9.5 9.6 7.8  RBC 2.95* 2.77* 3.00*  HGB 10.8* 10.6* 11.0*  HCT 32.7* 30.4* 33.2*  MCV 110.8* 109.7* 110.7*  MCH 36.6* 38.3* 36.7*  MCHC 33.0 34.9 33.1  RDW 13.4 13.6 13.8  PLT 249 237 236    BNP Recent Labs  Lab 09/18/20 0814  BNP 526.8*     DDimer No results for input(s): DDIMER in the last 168 hours.   Radiology    No results found.  Cardiac Studies   TTE 09/19/20 1. Left ventricular ejection fraction, by estimation, is 50 to 55%. The  left ventricle has low normal function. The left ventricle has no regional  wall motion abnormalities. There is mild left ventricular hypertrophy.  Left ventricular diastolic  parameters are indeterminate.  2. Right ventricular systolic function is moderately reduced. The right  ventricular size is mildly enlarged.  3. Right atrial size was mildly dilated.  4. The inferior vena cava is dilated in size with <50% respiratory  variability, suggesting right atrial pressure of 15 mmHg.  5. Bradycardia, rate 40 bpm  6. Left pleural effusion noted 3-4 cm   Patient Profile     80 y.o. 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  Wide-complex tachycardia: Likely due to SVT with a class Ic antiarrhythmic effect.  Patient was on flecainide.  Flecainide has been stopped and she is on amiodarone.  Plan for discharge today.  [name] need 400 mg twice daily for the next 2 weeks followed by 200 mg a day.  She has had no further bradycardia.  2.  Hypertension: Blood pressure elevated today.  It is also been elevated at home.  [name] start amlodipine 5 mg which can be adjusted as an outpatient.  For questions or updates, please contact [place] Please consult [email or web address] for contact info under        Signed, Arlyce Circle Meredith Leeds, MD  09/22/2020, 8:44 AM

## 2020-09-22 NOTE — Plan of Care (Signed)

## 2020-09-22 NOTE — Discharge Summary (Addendum)
Discharge Summary    Patient ID: [name] MRN: [id number]; DOB: [birth year]  Admit date: 09/19/2020 Discharge date: 09/22/2020  PCP:  [name], MD   [place]  Cardiologist:  Virl Axe, MD   Discharge Diagnoses    Principal Problem:   VT (ventricular tachycardia) [facility]) Active Problems:   HTN (hypertension)   Atrial fibrillation ([place])   Ventricular tachycardia ([place])   Slow heart beat  Diagnostic Studies/Procedures    Echocardiogram 09/19/20:   1. Left ventricular ejection fraction, by estimation, is 50 to 55%. The  left ventricle has low normal function. The left ventricle has no regional  wall motion abnormalities. There is mild left ventricular hypertrophy.  Left ventricular diastolic  parameters are indeterminate.   2. Right ventricular systolic function is moderately reduced. The right  ventricular size is mildly enlarged.   3. Right atrial size was mildly dilated.   4. The inferior vena cava is dilated in size with <50% respiratory  variability, suggesting right atrial pressure of 15 mmHg.   5. Bradycardia, rate 40 bpm   6. Left pleural effusion noted 3-4 cm   History of Present Illness     [name] is a 80 y.o. female with a history of HFpEF, paroxysmal atrial fibrillation (on Flecainide & Pradaxa), left breast CA (s/p XRT),  ETOH use, frequent falls and hypertension, who presented to [facility] 09/19/20 with worsening shortness of breath and weight gain.   Hospital Course     On presentation, she reported that her lasix was recently discontinued and she subsequently had a 10lb weight gain with increased dyspnea. She presented to an outside hospital and was found to have a BNP at 526, CXR showed bilateral pleural effusions consistent with CHF. EKG showed sinus rhythm with HsT 57>>90.  She was given IV Lasix with adequate diuresing and subsequent improvement in symptoms. While in the hospital the patient had episodes of  wide-complex tachycardia with syncope on some occasion. She was subsequently cardioverted on one occasion from VT to sinus rhythm>>SB in the 50s. There was a concern for flecainide toxicity and the patient was therefore started on a bicarbonate IV infusion and IV amiodarone. She was then transferred to [facility] for an EP evaluation.   She was seen by EP, Dr. Allegra Lai on 09/21/20 at which time the plan was to stop flecainide and transition to PO Amiodarone. WCT was likely due to SVT with a class Ic antiarrhythmic effect. She had no further bradycardia or VT rhythms on telemetry review and tolerated medications well.   She was also having issues with HTN and was started on amlodipine 5mg  with plans to up-titrate as tolerated in the OP setting. Continue losartan, although was not on this during her hospital course. Due to bradycardia, metoprolol was stopped.    Medication plan as follows: -Amiodarone 400 mg PO BID for 2 weeks (started 09/21/20>>10/05/20) followed by 200 mg PO QD thereafter (to start on 10/06/20 -Amlodipine 5mg  PO QD   Consultants: None   The patient was seen and examined by Dr. Ileana Ladd who feels that she is stable and ready for discharge today, 09/22/20.   Did the patient have an acute coronary syndrome (MI, NSTEMI, STEMI, etc) this admission?:  No                               Did the patient have a percutaneous coronary intervention (stent /  angioplasty)?:  No.    ____________  Discharge Vitals Blood pressure (!) 165/88, pulse 88, temperature 99.1 F (37.3 C), temperature source Oral, resp. rate 16, height 5\' 7"  (1.702 m), weight 57.2 kg, SpO2 98 %.  Filed Weights   09/20/20 0500 09/21/20 0500 09/21/20 1310  Weight: 54.5 kg 53.4 kg 57.2 kg    Labs & Radiologic Studies    CBC Recent Labs    09/19/20 2353 09/20/20 1102  WBC 9.6 7.8  HGB 10.6* 11.0*  HCT 30.4* 33.2*  MCV 109.7* 110.7*  PLT 237 563   Basic Metabolic Panel Recent Labs    09/19/20 2353  09/20/20 1102 09/21/20 0052 09/22/20 0147  NA 137 138 136 136  K 4.4 3.5 3.9 4.0  CL 103 100 101 101  CO2 26 25 26 24   GLUCOSE 153* 100* 98 89  BUN 30* 25* 22 14  CREATININE 1.27* 1.16* 1.14* 1.08*  CALCIUM 8.6* 8.9 8.5* 9.2  MG 2.3 1.9  --   --   PHOS  --  1.7*  --   --    Liver Function Tests Recent Labs    09/21/20 0052 09/22/20 0147  AST 44* 24  ALT 49* 35  ALKPHOS 162* 140*  BILITOT 0.5 0.8  PROT 5.4* 5.8*  ALBUMIN 2.8* 2.9*   No results for input(s): LIPASE, AMYLASE in the last 72 hours. High Sensitivity Troponin:   Recent Labs  Lab 09/18/20 0815 09/18/20 0958 09/18/20 1225 09/19/20 0340  TROPONINIHS 13 57* 90* 68*    BNP Invalid input(s): POCBNP D-Dimer No results for input(s): DDIMER in the last 72 hours. Hemoglobin A1C No results for input(s): HGBA1C in the last 72 hours. Fasting Lipid Panel No results for input(s): CHOL, HDL, LDLCALC, TRIG, CHOLHDL, LDLDIRECT in the last 72 hours. Thyroid Function Tests Recent Labs    09/20/20 1102  TSH 2.138   _____________  DG Chest Portable 1 View  Result Date: 09/18/2020 CLINICAL DATA:  Suspected CHF.  Shortness of breath EXAM: PORTABLE CHEST 1 VIEW COMPARISON:  09/10/2017 FINDINGS: Stable mild cardiomegaly. Interstitial opacity with Kerley lines and small pleural effusions. Possible chronic hyperinflation. Stable aortic and hilar contours. No pneumothorax. IMPRESSION: CHF. Electronically Signed   By: Monte Fantasia M.D.   On: 09/18/2020 08:37   ECHOCARDIOGRAM COMPLETE  Result Date: 09/19/2020    ECHOCARDIOGRAM REPORT   Patient Name:   [name] Date of Exam: 09/19/2020 Medical Rec #:  [id number]          Height:       66.0 in Accession #:    [id number]         Weight:       118.0 lb Date of Birth:  June 20, 1941          BSA:          1.598 m Patient Age:    72 years           BP:           109/61 mmHg Patient Gender: F                  HR:           51 bpm. Exam Location:  [facility] Procedure: 2D Echo, Cardiac  Doppler and Color Doppler Indications:     CHF- acute systolic T01.60  History:         Patient has prior history of Echocardiogram examinations, most  recent 03/29/2020. Risk Factors:Dyslipidemia and Hypertension.  Sonographer:     Sherrie Sport RDCS (AE) Referring Phys:  [id number] Benny Lennert Diagnosing Phys: Ida Rogue MD IMPRESSIONS  1. Left ventricular ejection fraction, by estimation, is 50 to 55%. The left ventricle has low normal function. The left ventricle has no regional wall motion abnormalities. There is mild left ventricular hypertrophy. Left ventricular diastolic parameters are indeterminate.  2. Right ventricular systolic function is moderately reduced. The right ventricular size is mildly enlarged.  3. Right atrial size was mildly dilated.  4. The inferior vena cava is dilated in size with <50% respiratory variability, suggesting right atrial pressure of 15 mmHg.  5. Bradycardia, rate 40 bpm  6. Left pleural effusion noted 3-4 cm FINDINGS  Left Ventricle: Left ventricular ejection fraction, by estimation, is 50 to 55%. The left ventricle has low normal function. The left ventricle has no regional wall motion abnormalities. The left ventricular internal cavity size was normal in size. There is mild left ventricular hypertrophy. Left ventricular diastolic parameters are indeterminate. Right Ventricle: The right ventricular size is mildly enlarged. No increase in right ventricular wall thickness. Right ventricular systolic function is moderately reduced. Left Atrium: Left atrial size was normal in size. Right Atrium: Right atrial size was mildly dilated. Pericardium: There is no evidence of pericardial effusion. Mitral Valve: The mitral valve is normal in structure. Mild mitral valve regurgitation. No evidence of mitral valve stenosis. Tricuspid Valve: The tricuspid valve is normal in structure. Tricuspid valve regurgitation is mild . No evidence of tricuspid stenosis. Aortic Valve: The  aortic valve was not well visualized. Aortic valve regurgitation is not visualized. No aortic stenosis is present. Aortic valve mean gradient measures 2.0 mmHg. Aortic valve peak gradient measures 3.1 mmHg. Aortic valve area, by VTI measures 1.96 cm. Pulmonic Valve: The pulmonic valve was normal in structure. Pulmonic valve regurgitation is not visualized. No evidence of pulmonic stenosis. Aorta: The aortic root is normal in size and structure. Venous: The inferior vena cava is dilated in size with less than 50% respiratory variability, suggesting right atrial pressure of 15 mmHg. IAS/Shunts: No atrial level shunt detected by color flow Doppler.  LEFT VENTRICLE PLAX 2D LVIDd:         3.92 cm  Diastology LVIDs:         2.40 cm  LV e' medial:    4.24 cm/s LV PW:         1.32 cm  LV E/e' medial:  16.3 LV IVS:        0.90 cm  LV e' lateral:   8.38 cm/s LVOT diam:     2.00 cm  LV E/e' lateral: 8.2 LV SV:         33 LV SV Index:   20 LVOT Area:     3.14 cm  RIGHT VENTRICLE RV Basal diam:  3.83 cm RV S prime:     10.60 cm/s TAPSE (M-mode): 3.4 cm LEFT ATRIUM           Index       RIGHT ATRIUM           Index LA diam:      3.40 cm 2.13 cm/m  RA Area:     23.80 cm LA Vol (A2C): 33.4 ml 20.90 ml/m RA Volume:   77.00 ml  48.18 ml/m LA Vol (A4C): 38.0 ml 23.78 ml/m  AORTIC VALVE  PULMONIC VALVE AV Area (Vmax):    1.85 cm    PV Vmax:        0.41 m/s AV Area (Vmean):   1.88 cm    PV Peak grad:   0.7 mmHg AV Area (VTI):     1.96 cm    RVOT Peak grad: 1 mmHg AV Vmax:           87.50 cm/s AV Vmean:          62.000 cm/s AV VTI:            0.167 m AV Peak Grad:      3.1 mmHg AV Mean Grad:      2.0 mmHg LVOT Vmax:         51.60 cm/s LVOT Vmean:        37.200 cm/s LVOT VTI:          0.104 m LVOT/AV VTI ratio: 0.62  AORTA Ao Root diam: 3.03 cm MITRAL VALVE               TRICUSPID VALVE MV Area (PHT): 3.06 cm    TR Peak grad:   25.0 mmHg MV Decel Time: 248 msec    TR Vmax:        250.00 cm/s MV E velocity:  69.00 cm/s MV A velocity: 20.60 cm/s  SHUNTS MV E/A ratio:  3.35        Systemic VTI:  0.10 m                            Systemic Diam: 2.00 cm Ida Rogue MD Electronically signed by Ida Rogue MD Signature Date/Time: 09/19/2020/8:02:00 PM    Final (Updated)    Disposition   Pt is being discharged home today in good condition.  Follow-up Plans & Appointments    Follow-up Information    Theora Gianotti, NP Follow up on 09/27/2020.   Specialties: Nurse Practitioner, Cardiology, Radiology Why: at 1030am  Contact information: [address] [place] [address] [phone]              Discharge Instructions    Amb referral to [facility]   Complete by: As directed    Call MD for:  difficulty breathing, headache or visual disturbances   Complete by: As directed    Call MD for:  extreme fatigue   Complete by: As directed    Call MD for:  hives   Complete by: As directed    Call MD for:  persistant dizziness or light-headedness   Complete by: As directed    Call MD for:  persistant nausea and vomiting   Complete by: As directed    Call MD for:  redness, tenderness, or signs of infection (pain, swelling, redness, odor or green/yellow discharge around incision site)   Complete by: As directed    Call MD for:  severe uncontrolled pain   Complete by: As directed    Call MD for:  temperature >100.4   Complete by: As directed    Diet - low sodium heart healthy   Complete by: As directed    Increase activity slowly   Complete by: As directed      Discharge Medications   Allergies as of 09/22/2020      Reactions   Adhesive [tape] Other (See Comments)   "Does not work well with me. Can take my skin off. Careful"  Medication List    STOP taking these medications   flecainide 50 MG tablet Commonly known as: TAMBOCOR   magnesium sulfate 2 GM/50ML Soln infusion   metoprolol tartrate 50 MG tablet Commonly known as: LOPRESSOR   Nitro-Bid  2 % ointment Generic drug: nitroGLYCERIN     TAKE these medications   acetaminophen 325 MG tablet Commonly known as: TYLENOL Take 2 tablets (650 mg total) by mouth every 4 (four) hours as needed for headache or mild pain.   allopurinol 100 MG tablet Commonly known as: ZYLOPRIM Take 1 tablet (100 mg total) by mouth daily. What changed: how much to take   amiodarone 400 MG tablet Commonly known as: PACERONE Take 1 tablet (400 mg total) by mouth 2 (two) times daily for 14 days. Notes to patient: Continue until 10/05/20 then start Amiodarone 200mg  daily    amiodarone 200 MG tablet Commonly known as: PACERONE Take 1 tablet (200 mg total) by mouth daily. Start taking on: October 06, 2020 Notes to patient: Start 200mg  daily on 10/06/20   amLODipine 5 MG tablet Commonly known as: NORVASC Take 1 tablet (5 mg total) by mouth daily.   amoxicillin 500 MG capsule Commonly known as: AMOXIL Take 500 mg by mouth See admin instructions. Take 4 tablets 1 hour prior to dental appointment   Chlorhexidine Gluconate Cloth 2 % Pads Apply 6 each topically daily.   cholecalciferol 25 MCG (1000 UNIT) tablet Commonly known as: VITAMIN D Take 2 tablets (2,000 Units total) by mouth daily.   dabigatran 150 MG Caps capsule Commonly known as: Pradaxa Take 1 capsule (150 mg total) by mouth 2 (two) times daily.   furosemide 40 MG tablet Commonly known as: LASIX Take 20 mg by mouth daily. What changed: Another medication with the same name was removed. Continue taking this medication, and follow the directions you see here.   LORazepam 0.5 MG tablet Commonly known as: ATIVAN Take 1 tablet (0.5 mg total) by mouth 2 (two) times daily as needed for anxiety. What changed: when to take this   losartan 50 MG tablet Commonly known as: COZAAR Take 50 mg by mouth daily.   mirtazapine 15 MG tablet Commonly known as: REMERON Take 1 tablet (15 mg total) by mouth at bedtime.   multivitamin capsule Take 1  capsule by mouth daily.   ondansetron 4 MG/2ML Soln injection Commonly known as: ZOFRAN Inject 2 mLs (4 mg total) into the vein every 6 (six) hours as needed for nausea.   PRESERVISION AREDS 2+MULTI VIT PO Take 1 capsule by mouth in the morning and at bedtime.       Outstanding Labs/Studies   None   Duration of Discharge Encounter   Greater than 30 minutes including physician time.  Signed, Kathyrn Drown, NP 09/22/2020, 10:51 AM    I have seen and examined this patient with Kathyrn Drown.  Agree with above, note added to reflect my findings.  On exam, RRR, no murmurs. Presented with wide complex tachycardia, found to be class 1c effect. Started on amiodarone without further arrhythmia. Initially had bradycardia but none further since date of admission. Discharge today with follow up in clinic.    Will M. Camnitz MD 09/22/2020 10:51 AM

## 2020-09-23 ENCOUNTER — Telehealth: Payer: Self-pay | Admitting: Internal Medicine

## 2020-09-23 MED ORDER — LOSARTAN POTASSIUM 50 MG PO TABS: 50.0000 mg | ORAL_TABLET | Freq: Every day | ORAL | 1 refills | Status: DC

## 2020-09-23 NOTE — Telephone Encounter (Signed)
I called and spoke with the patient. She advised she had questions about her medications post discharge.   She states she has been on metoprolol for years and this was not on her list. I advised her she was taking this in relation to the flecainide, but since the flecainide was stopped in the hospital, her metoprolol was also stopped.   I advised her that she was started on amlodipine 5 mg once daily for her hypertension, and that losartan 50 mg once daily was also added back to her list.   The patient advised she does not have the losartan in hand.   I advised her I will send this in for her.  She is aware she has an appointment with Ignacia Bayley, NP on Friday 09/27/20 in [place] at 10:30 am.  I have advised her to bring all of her medication bottles with her to that appointment.  I have also advised her to check her BP ~ 1-2 hours after taking her morning medications and record her BP readings. She has a history of falls. She is aware to call us back prior to Friday with any symptoms of dizziness/ lightheadedness/ falls/ or concerns.  The patient voices understanding and is agreeable.

## 2020-09-23 NOTE — Telephone Encounter (Signed)
Patient was recently discharged from hospital ICU and has some medication questions based off the list she was given at discharge. The main medications are metoprolol and losartan

## 2020-09-24 ENCOUNTER — Ambulatory Visit: Payer: Medicare Other | Admitting: Family Medicine

## 2020-09-27 ENCOUNTER — Other Ambulatory Visit: Payer: Self-pay

## 2020-09-27 ENCOUNTER — Ambulatory Visit (INDEPENDENT_AMBULATORY_CARE_PROVIDER_SITE_OTHER): Payer: Medicare Other | Admitting: Nurse Practitioner

## 2020-09-27 ENCOUNTER — Encounter: Payer: Self-pay | Admitting: Nurse Practitioner

## 2020-09-27 VITALS — BP 110/80 | HR 73 | Ht 67.0 in | Wt 118.0 lb

## 2020-09-27 DIAGNOSIS — R000 Tachycardia, unspecified: Secondary | ICD-10-CM

## 2020-09-27 DIAGNOSIS — I472 Ventricular tachycardia: Secondary | ICD-10-CM

## 2020-09-27 DIAGNOSIS — I4819 Other persistent atrial fibrillation: Secondary | ICD-10-CM

## 2020-09-27 DIAGNOSIS — I5032 Chronic diastolic (congestive) heart failure: Secondary | ICD-10-CM | POA: Diagnosis not present

## 2020-09-27 DIAGNOSIS — I10 Essential (primary) hypertension: Secondary | ICD-10-CM | POA: Diagnosis not present

## 2020-09-27 DIAGNOSIS — F1010 Alcohol abuse, uncomplicated: Secondary | ICD-10-CM

## 2020-09-27 NOTE — Progress Notes (Addendum)
Office Visit    Patient Name: [name] Date of Encounter: 09/27/2020  Primary Care Provider:  [name], MD Primary Cardiologist:  Dana Axe, MD  Chief Complaint    80 year old female with a history of persistent atrial fibrillation, HFpEF, left breast cancer, carotid arterial disease, GERD, hypertension, and hyperlipidemia, who presents for post hospital follow-up after hospitalization for heart failure and subsequent development of wide-complex tachycardia presumed to be secondary to flecainide.  Past Medical History    Past Medical History:  Diagnosis Date  . (HFpEF) heart failure with preserved ejection fraction ([place])    a. 08/2017 Echo: EF 55-60%, no rwma, mild MR, mildly dil LA, nl RV fxn; b. 03/2020 Echo: EF 60-65%, no rwma, Gr2 DD. RVSP 44.97mmHg. Mod dil LA. Mild MR; c. 09/2020 Echo: EF 50-55%, no rwma, mild LVH, mod red RV fxn, mildly dily RA.  Marland Kitchen Acute CHF (congestive heart failure) ([place]) 09/18/2020  . Breast cancer ([place]) 2001   left breast  . Cancer ([place]) 2001   left breast ca  . Carotid arterial disease ([place])    a. 10/2004 s/p L CEA; b. 12/2015 Carotid U/S: RICA 1-39%; b. LICA patent CEA site.  . Closed right hip fracture ([place]) 09/20/2019  . GERD (gastroesophageal reflux disease)   . Hyperlipidemia   . Hypertension   . Osteoarthritis, multiple sites   . Osteopenia   . Persistent atrial fibrillation ([place])    a. Dx 08/2017; b. CHA2DS2VASc = 6-->Pradaxa; c. 09/2017 Successful DCCV (second shock - 200J); d. 10/2017 Recurrent Afib-->flecainide started 11/2017; e. 06/2020 s/p DCCV (200J x 1); f. 06/2020 Recurrent AF->Flec inc 75bid; g. 09/2020 WCT->flec d/c'd->amio started.  . Personal history of radiation therapy 2001   left breast ca  . Psoriasis   . Wide-complex tachycardia ([place])    a. 09/2020 in setting of presumed Flecainide toxicity.  Flecainide d/c'd.   Past Surgical History:  Procedure Laterality Date  . ABDOMINAL HYSTERECTOMY    . ANKLE FRACTURE  SURGERY  4/08   left---hardware still in place  . BREAST BIOPSY Left 2001   breast ca  . BREAST EXCISIONAL BIOPSY Left yrs ago   benign  . BREAST LUMPECTOMY Left 2001   f/u radiation  . CARDIOVERSION N/A 10/04/2017   Procedure: CARDIOVERSION;  Surgeon: Dana Hampshire, MD;  Location: [facility];  Service: Cardiovascular;  Laterality: N/A;  . CARDIOVERSION N/A 12/16/2017   Procedure: CARDIOVERSION;  Surgeon: Dana Sprang, MD;  Location: [facility];  Service: Cardiovascular;  Laterality: N/A;  . CARDIOVERSION N/A 06/24/2020   Procedure: CARDIOVERSION;  Surgeon: Dana Hampshire, MD;  Location: [facility];  Service: Cardiovascular;  Laterality: N/A;  . CAROTID ENDARTERECTOMY Left 10/23/2004  . FRACTURE SURGERY    . OOPHORECTOMY    . SHOULDER SURGERY  6/07   left  . TONSILLECTOMY AND ADENOIDECTOMY    . TOTAL HIP ARTHROPLASTY  2004   right    Allergies  Allergies  Allergen Reactions  . Adhesive [Tape] Other (See Comments)    "Does not work well with me. Can take my skin off. Careful"    History of Present Illness    80 year old female with above past medical history including hypertension, hyperlipidemia, carotid arterial disease status post left carotid endarterectomy, left breast cancer, arthritis, and osteopenia.  In February 2019, she was admitted to [facility] with progressive lower extremity swelling and dyspnea times several months.  She was found to be in rapid atrial fibrillation was placed on  beta-blocker, diltiazem, and Xarelto therapy.  Echocardiogram showed normal LV function which she was subsequently discharged following diuresis and adequate rate control.  Cardioversion was performed in March 2019 but she was back in atrial fibrillation at the time of follow-up in April 2019 and she was placed on flecainide and rivaroxaban therapy, later switched to dabigatran.  Echocardiography in August 2021 showed normal LV function (60-65%) and grade 2 diastolic dysfunction.  In  December 2021, she was noted to be back in atrial fibrillation with associated dyspnea on exertion.  She was advised to cut back on alcohol and underwent successful cardioversion December 6 but at follow-up post cardioversion, she was again noted be in atrial fibrillation and flecainide was increased to 75 mg twice daily.  At EP follow-up in late January, she had converted back to sinus rhythm.  Lasix therapy was discontinued in February 2022 in the setting of rising creatinine to 1.77 with associated dry mouth and itching.  Following discontinuation, she began experiencing increasing dyspnea on exertion with subsequent 10 pound weight gain prompting her to present to the [place] regional ED on March 2 where she was volume overloaded.  She responded well to IV Lasix and was initially thought she might be appropriate for early discharge but she subsequently developed symptomatic wide-complex tachycardia that initially broke with an amiodarone bolus.  This was followed by junctional bradycardia.  Patient was placed on dopamine and admitted to the ICU.  In the ICU, she had recurrent episodes of symptomatic wide-complex tachycardia requiring defibrillation.  Case was reviewed with electrophysiology and was felt that wide-complex tachycardia might represent SVT with class Ic antiarrhythmic effect and as result, flecainide was discontinued.  She was transferred to [facility] for more formal EP evaluation.  There, echo showed EF of 50-55% without any significant valvular disease.  Off of flecainide and on amiodarone, she had no further wide-complex tachycardia and she was subsequently discharged on March 6.  Since discharge, [name] has felt well.  As far she can tell, she is tolerating amiodarone just fine.  She reports compliance with her Lasix and has not had any dyspnea or edema.  She denies chest pain, palpitations, PND, orthopnea, dizziness, syncope, or early satiety.  Home Medications    Prior to Admission  medications   Medication Sig Start Date End Date Taking? Authorizing Provider  acetaminophen (TYLENOL) 325 MG tablet Take 2 tablets (650 mg total) by mouth every 4 (four) hours as needed for headache or mild pain. 09/19/20   Swayze, Ava, DO  allopurinol (ZYLOPRIM) 100 MG tablet Take 1 tablet (100 mg total) by mouth daily. Patient taking differently: Take 200 mg by mouth daily. 08/08/20   Dana Sprang, MD  amiodarone (PACERONE) 200 MG tablet Take 1 tablet (200 mg total) by mouth daily. 10/06/20   Kathyrn Drown D, NP  amiodarone (PACERONE) 400 MG tablet Take 1 tablet (400 mg total) by mouth 2 (two) times daily for 14 days. 09/21/20 10/05/20  Kathyrn Drown D, NP  amLODipine (NORVASC) 5 MG tablet Take 1 tablet (5 mg total) by mouth daily. 09/22/20   Kathyrn Drown D, NP  amoxicillin (AMOXIL) 500 MG capsule Take 500 mg by mouth See admin instructions. Take 4 tablets 1 hour prior to dental appointment    [provider]  Chlorhexidine Gluconate Cloth 2 % PADS Apply 6 each topically daily. 09/19/20   Swayze, Ava, DO  cholecalciferol (VITAMIN D) 25 MCG (1000 UNIT) tablet Take 2 tablets (2,000 Units total) by mouth  daily. 09/20/20   Swayze, Ava, DO  dabigatran (PRADAXA) 150 MG CAPS capsule Take 1 capsule (150 mg total) by mouth 2 (two) times daily. 09/19/20   Swayze, Ava, DO  furosemide (LASIX) 40 MG tablet Take 20 mg by mouth daily.    [provider]  LORazepam (ATIVAN) 0.5 MG tablet Take 1 tablet (0.5 mg total) by mouth 2 (two) times daily as needed for anxiety. Patient taking differently: Take 0.5 mg by mouth at bedtime. 09/19/20   Swayze, Ava, DO  losartan (COZAAR) 50 MG tablet Take 1 tablet (50 mg total) by mouth daily. 09/23/20   Dana Sprang, MD  mirtazapine (REMERON) 15 MG tablet Take 1 tablet (15 mg total) by mouth at bedtime. 09/19/20   Swayze, Ava, DO  Multiple Vitamin (MULTIVITAMIN) capsule Take 1 capsule by mouth daily.    [provider]  Multiple Vitamins-Minerals (PRESERVISION  AREDS 2+MULTI VIT PO) Take 1 capsule by mouth in the morning and at bedtime. Patient not taking: No sig reported    [provider]  ondansetron (ZOFRAN) 4 MG/2ML SOLN injection Inject 2 mLs (4 mg total) into the vein every 6 (six) hours as needed for nausea. 09/19/20   Swayze, Ava, DO    Review of Systems    She denies chest pain, palpitations, dyspnea, pnd, orthopnea, n, v, dizziness, syncope, edema, weight gain, or early satiety.  All other systems reviewed and are otherwise negative except as noted above.  Physical Exam    VS:  BP 110/80 (BP Location: Left Arm, Patient Position: Sitting, Cuff Size: Normal)   Pulse 73   Ht 5\' 7"  (1.702 m)   Wt 118 lb (53.5 kg)   BMI 18.48 kg/m  , BMI Body mass index is 18.48 kg/m. GEN: Thin, somewhat frail, in no acute distress. HEENT: normal. Neck: Supple, no JVD, carotid bruits, or masses. Cardiac: RRR, no murmurs, rubs, or gallops. No clubbing, cyanosis, edema.  Radials/PT 2+ and equal bilaterally.  Respiratory:  Respirations regular and unlabored, clear to auscultation bilaterally. GI: Soft, nontender, nondistended, BS + x 4. MS: no deformity or atrophy. Skin: warm and dry, no rash. Neuro:  Strength and sensation are intact. Psych: Normal affect.  Accessory Clinical Findings    ECG personally reviewed by me today - RSR, 73, no acute changes.  Lab Results  Component Value Date   WBC 7.8 09/20/2020   HGB 11.0 (L) 09/20/2020   HCT 33.2 (L) 09/20/2020   MCV 110.7 (H) 09/20/2020   PLT 236 09/20/2020   Lab Results  Component Value Date   CREATININE 1.08 (H) 09/22/2020   BUN 14 09/22/2020   NA 136 09/22/2020   K 4.0 09/22/2020   CL 101 09/22/2020   CO2 24 09/22/2020   Lab Results  Component Value Date   ALT 35 09/22/2020   AST 24 09/22/2020   ALKPHOS 140 (H) 09/22/2020   BILITOT 0.8 09/22/2020   Lab Results  Component Value Date   CHOL 216 (H) 01/11/2019   HDL 88.90 01/11/2019   LDLCALC 104 (H) 01/11/2019    LDLDIRECT 151.4 02/01/2012   TRIG 116.0 01/11/2019   CHOLHDL 2 01/11/2019    Lab Results  Component Value Date   HGBA1C 5.3 12/18/2014    Assessment & Plan    1.  Wide-complex tachycardia/SVT with class Ic affect: Patient recently hospitalized in the setting of acute on chronic HFpEF but during hospital stay developed wide-complex tachycardia associated with hypotension eventually requiring defibrillation.  She was evaluated  by electrophysiology at [facility] and wide-complex tachycardia was felt less likely to be VT and more likely to be SVT with a class Ic effect in the setting of flecainide therapy.  In this setting, flecainide was discontinued and she is now on amiodarone therapy for management of atrial fibrillation, which she is tolerating.  She denies any recurrent palpitations, presyncope, or syncope.  2.  Persistent atrial fibrillation: Maintaining sinus rhythm and now tolerating amiodarone therapy following discontinuation of flecainide last week as outlined above.  She has no complaints today and reports good compliance with Pradaxa.  Of note, she initially thought/reported that she had been taking 400 twice daily plus an additional 200 mg daily for a total of 1000 mg daily.  We reviewed the pills in her pill box w/ her and her son, and discovered that she is taking amio 400 bid and the other pill is allopurinol, not amio 200.  QTc is stable.  I discussed instructions for taking amiodarone at length today.  She will drop to 200 mg daily when she has completed 14 days of 400 twice daily.  3.  Essential hypertension: Blood pressure stable following initiation of amlodipine therapy during recent hospitalization.  4.  Hyperlipidemia: LDL of 104 in June 2020.  She previously refused statins.  5.  Heart failure with preserved ejection fraction: Echo last week with an EF of 50-55%.  Euvolemic today.  She recently had a rising creatinine on Lasix therapy and she stopped it, which led to  hospitalization last week.  She is currently on Lasix 20 mg daily and reports compliance.  I will follow-up a basic metabolic panel today.  Heart rate and blood pressure well controlled.  6.  Alcohol abuse: She continues that she is cutting back.  Complete cessation advised.  7.  Disposition: Follow-up basic metabolic panel today.  Follow-up in clinic in 1 month or sooner if necessary.   Murray Hodgkins, NP 09/27/2020, 1:08 PM

## 2020-09-27 NOTE — Patient Instructions (Signed)
Medication Instructions:  No changes  *If you need a refill on your cardiac medications before your next appointment, please call your pharmacy*   Lab Work: BMET today  If you have labs (blood work) drawn today and your tests are completely normal, you will receive your results only by: Marland Kitchen MyChart Message (if you have MyChart) OR . A paper copy in the mail If you have any lab test that is abnormal or we need to change your treatment, we will call you to review the results.   Testing/Procedures: None   Follow-Up: At [facility], you and your health needs are our priority.  As part of our continuing mission to provide you with exceptional heart care, we have created designated Provider Care Teams.  These Care Teams include your primary Cardiologist (physician) and Advanced Practice Providers (APPs -  Physician Assistants and Nurse Practitioners) who all work together to provide you with the care you need, when you need it.  We recommend signing up for the patient portal called "MyChart".  Sign up information is provided on this After Visit Summary.  MyChart is used to connect with patients for Virtual Visits (Telemedicine).  Patients are able to view lab/test results, encounter notes, upcoming appointments, etc.  Non-urgent messages can be sent to your provider as well.   To learn more about what you can do with MyChart, go to [email or web address].    Your next appointment:   1 month(s)  The format for your next appointment:   In Person  Provider:   Murray Hodgkins, NP

## 2020-09-28 LAB — BASIC METABOLIC PANEL
BUN/Creatinine Ratio: 19 (ref 12–28)
BUN: 24 mg/dL (ref 8–27)
CO2: 19 mmol/L — ABNORMAL LOW (ref 20–29)
Calcium: 10.7 mg/dL — ABNORMAL HIGH (ref 8.7–10.3)
Chloride: 101 mmol/L (ref 96–106)
Creatinine, Ser: 1.27 mg/dL — ABNORMAL HIGH (ref 0.57–1.00)
Glucose: 83 mg/dL (ref 65–99)
Potassium: 5 mmol/L (ref 3.5–5.2)
Sodium: 139 mmol/L (ref 134–144)
eGFR: 42 mL/min/{1.73_m2} — ABNORMAL LOW (ref >59)

## 2020-09-30 ENCOUNTER — Telehealth: Payer: Self-pay | Admitting: Internal Medicine

## 2020-09-30 ENCOUNTER — Telehealth: Payer: Self-pay | Admitting: Nurse Practitioner

## 2020-09-30 DIAGNOSIS — I5032 Chronic diastolic (congestive) heart failure: Secondary | ICD-10-CM

## 2020-09-30 DIAGNOSIS — Z79899 Other long term (current) drug therapy: Secondary | ICD-10-CM

## 2020-09-30 NOTE — Telephone Encounter (Signed)
I spoke with the patient. She states she had an old bottle of lasix 40 mg with instructions to take an extra 0.5 tablet (20 mg) in the evening, which she has been doing in addition to her 0.5 tablet (20 mg) in the morning.  I have advised her per Ignacia Bayley, NP's most recent office note from 09/27/20, she should be taking: Lasix 40 mg- 0.5 tablet (20 mg) once daily   I have also advised the patient that Gerald Stabs, NP had reviewed her BMP from Friday 09/27/20:  Theora Gianotti, NP  09/30/2020 7:42 AM EDT      Kidney's appear a little dry. Given recent CHF admission, recommend continuing current dose of lasix but encourage increase oral intake of water. Potassium is borderline high. Let's plan to [facility].   The patient has been made aware of her results and Gerald Stabs' recommendations as above. The patient is agreeable with these recommendations and for a repeat BMP in 2 weeks in the [place].  I have asked her to call back with any further questions/ concerns in the interim.

## 2020-09-30 NOTE — Telephone Encounter (Signed)
Pt c/o medication issue:  1. Name of Medication: furosemide   2. How are you currently taking this medication (dosage and times per day)? Patient has been taking 20 mg daily   3. Are you having a reaction (difficulty breathing--STAT)?   4. What is your medication issue? Calling to confirm dosage as the prescription bottle instructions have confused patient

## 2020-09-30 NOTE — Telephone Encounter (Signed)
Attempted to call the patient. No answer- I left a message to please call back.  

## 2020-09-30 NOTE — Telephone Encounter (Signed)
The patient called into the office this afternoon. Call transferred directly to me from scheduling.  Per the patient, she states she was sitting at the kitchen table this afternoon going through some papers and started to feel dizzy/ lightheaded/ some heart flutterings.  She checked her BP and it was 102/64. She states earlier today she was 130/70.  She wanted to know what she should do right now.  I advised her I do not think there is anything different to do right now.  I have asked her to make sure she is drinking water as we discussed earlier this morning when I spoke with her about her labs.  I have also asked her to please record her BP readings 1-2 times/ day and call us back if she is trending low or having symptoms associated with low readings.  At that point, we may need to look at cutting back on some of her BP meds.  The patient voices understanding and is agreeable.

## 2020-10-03 ENCOUNTER — Ambulatory Visit: Payer: Medicare Other | Admitting: Family

## 2020-10-03 NOTE — Progress Notes (Signed)
Prob 200 bid for another two weeks or so and then 200 daily /Hope this helps SK

## 2020-10-14 ENCOUNTER — Telehealth: Payer: Self-pay | Admitting: *Deleted

## 2020-10-14 ENCOUNTER — Other Ambulatory Visit
Admission: RE | Admit: 2020-10-14 | Discharge: 2020-10-14 | Disposition: A | Discharge status: Home / Self Care | Payer: Medicare Other | Attending: Nurse Practitioner | Admitting: Nurse Practitioner

## 2020-10-14 DIAGNOSIS — Z79899 Other long term (current) drug therapy: Secondary | ICD-10-CM | POA: Diagnosis not present

## 2020-10-14 DIAGNOSIS — I5032 Chronic diastolic (congestive) heart failure: Secondary | ICD-10-CM | POA: Diagnosis not present

## 2020-10-14 LAB — BASIC METABOLIC PANEL
Anion gap: 11 (ref 5–15)
BUN: 30 mg/dL — ABNORMAL HIGH (ref 8–23)
CO2: 25 mmol/L (ref 22–32)
Calcium: 9.7 mg/dL (ref 8.9–10.3)
Chloride: 102 mmol/L (ref 98–111)
Creatinine, Ser: 1.29 mg/dL — ABNORMAL HIGH (ref 0.44–1.00)
GFR, Estimated: 42 mL/min — ABNORMAL LOW (ref >60)
Glucose, Bld: 109 mg/dL — ABNORMAL HIGH (ref 70–99)
Potassium: 4.8 mmol/L (ref 3.5–5.1)
Sodium: 138 mmol/L (ref 135–145)

## 2020-10-14 NOTE — Telephone Encounter (Signed)
Left voicemail message to call back  

## 2020-10-14 NOTE — Telephone Encounter (Signed)
-----   Message from Rise Mu, [place] sent at 10/14/2020  1:28 PM EDT ----- Renal function remains elevated and is slightly higher than her check on 3/11.  Potassium is high normal. Recommend she hold Lasix for 1 day and then resume this at 20 mg every other day in an effort to maintain a euvolemic state without dehydration given when this was previously held it led to a CHF admission.  Renal function can be rechecked when she is seen in follow-up on 4/12.

## 2020-10-14 NOTE — Telephone Encounter (Signed)
Left voicemail message to please call back for review of results and recommendations.

## 2020-10-15 NOTE — Telephone Encounter (Signed)
Called patient and gave her the result note from [employer]. Patient confirmed that she will hold her Lasix for one day and then resume 20 MG QOD. Patient also confirmed that she will be here for her follow up appointment on 10/29/20.

## 2020-10-15 NOTE — Telephone Encounter (Signed)
Patient returning call for results. Please advise  

## 2020-10-17 ENCOUNTER — Telehealth: Payer: Self-pay | Admitting: Internal Medicine

## 2020-10-17 NOTE — Telephone Encounter (Signed)
Patient calling to discuss nose bleed.   She states it started yesterday and has continued intermittently.  When starting yesterday it was bright red in color and after draining down the back of her throat it came out in clots.   Currently pink tinge on tissue and she is able to pinch her nose to talk on the phone.

## 2020-10-17 NOTE — Telephone Encounter (Signed)
Call transferred directly to this nurse when the patient called this morning.  Per Mrs. [name], she had a nose bleed ~ 1 week ago that lasted about 1.5 hours She had another nosebleed yesterday that lasted about 1.5 years. Her most recent nosebleed was ~ 4am this morning. She was able to lay back down around 5:30 am when the bleeding was under control. When she woke up at 7:30 am, there was light bleeding from the nose.  All episodes were a steady drip of blood that she was able to stop with pinching her nose.  She denies any dizziness/ lightheadedness. Her last CBC was on 09/20/20: Hgb- 11.0/ plt- 236.  The patient is currently on Pradaxa for atrial fibrillation.  The patient states she did use some nasal saline spray for the first time yesterday.  I have advised her with the time of year, it is hard to know if her nosebleeds are just coming from her sinus's being dry or irritated.  She denies any other sources of bleeding.  I have asked her to continue to use nasal saline spray to moisten her sinuses. I have also advised to try a little neosporin/ polysporin right inside the nose. She is advised if symptoms persist/ worsen to please call us back.  The patient voices understanding and is agreeable.

## 2020-10-29 ENCOUNTER — Encounter: Payer: Self-pay | Admitting: Nurse Practitioner

## 2020-10-29 ENCOUNTER — Ambulatory Visit (INDEPENDENT_AMBULATORY_CARE_PROVIDER_SITE_OTHER): Payer: Medicare Other | Admitting: Nurse Practitioner

## 2020-10-29 ENCOUNTER — Other Ambulatory Visit: Payer: Self-pay

## 2020-10-29 VITALS — BP 116/80 | HR 62 | Ht 67.0 in | Wt 119.1 lb

## 2020-10-29 DIAGNOSIS — I10 Essential (primary) hypertension: Secondary | ICD-10-CM

## 2020-10-29 DIAGNOSIS — I4819 Other persistent atrial fibrillation: Secondary | ICD-10-CM

## 2020-10-29 DIAGNOSIS — I5032 Chronic diastolic (congestive) heart failure: Secondary | ICD-10-CM | POA: Diagnosis not present

## 2020-10-29 DIAGNOSIS — N179 Acute kidney failure, unspecified: Secondary | ICD-10-CM | POA: Diagnosis not present

## 2020-10-29 NOTE — Patient Instructions (Addendum)
Medication Instructions:  Your physician has recommended you make the following change in your medication:   1. DECREASE Amiodarone to 200 mg once a day  *If you need a refill on your cardiac medications before your next appointment, please call your pharmacy*   Lab Work: BMET today   If you have labs (blood work) drawn today and your tests are completely normal, you will receive your results only by: Marland Kitchen MyChart Message (if you have MyChart) OR . A paper copy in the mail If you have any lab test that is abnormal or we need to change your treatment, we will call you to review the results.   Testing/Procedures: None   Follow-Up: At [facility], you and your health needs are our priority.  As part of our continuing mission to provide you with exceptional heart care, we have created designated Provider Care Teams.  These Care Teams include your primary Cardiologist (physician) and Advanced Practice Providers (APPs -  Physician Assistants and Nurse Practitioners) who all work together to provide you with the care you need, when you need it.  We recommend signing up for the patient portal called "MyChart".  Sign up information is provided on this After Visit Summary.  MyChart is used to connect with patients for Virtual Visits (Telemedicine).  Patients are able to view lab/test results, encounter notes, upcoming appointments, etc.  Non-urgent messages can be sent to your provider as well.   To learn more about what you can do with MyChart, go to [email or web address].    Your next appointment:   3 month(s)   Keep scheduled appointment 01/23/21 at 09:20 am with Dr. Caryl Comes  The format for your next appointment:   In Person  Provider:   Virl Axe, MD

## 2020-10-29 NOTE — Progress Notes (Signed)
Office Visit    Patient Name: [name] Date of Encounter: 10/29/2020  Primary Care Provider:  [name], MD Primary Cardiologist:  Virl Axe, MD  Chief Complaint    80 y/o female with a history of persistent atrial fibrillation, HFpEF, left breast cancer, carotid artery disease, GERD, HTN, hyperlipidemia, who presents for evaluation of her a fib and HFpEF.   Past Medical History    Past Medical History:  Diagnosis Date  . (HFpEF) heart failure with preserved ejection fraction ([place])    a. 08/2017 Echo: EF 55-60%, no rwma, mild MR, mildly dil LA, nl RV fxn; b. 03/2020 Echo: EF 60-65%, no rwma, Gr2 DD. RVSP 44.38mmHg. Mod dil LA. Mild MR; c. 09/2020 Echo: EF 50-55%, no rwma, mild LVH, mod red RV fxn, mildly dily RA.  Marland Kitchen Acute CHF (congestive heart failure) ([place]) 09/18/2020  . Breast cancer ([place]) 2001   left breast  . Cancer ([place]) 2001   left breast ca  . Carotid arterial disease ([place])    a. 10/2004 s/p L CEA; b. 12/2015 Carotid U/S: RICA 1-39%; b. LICA patent CEA site.  . Closed right hip fracture ([place]) 09/20/2019  . GERD (gastroesophageal reflux disease)   . Hyperlipidemia   . Hypertension   . Osteoarthritis, multiple sites   . Osteopenia   . Persistent atrial fibrillation ([place])    a. Dx 08/2017; b. CHA2DS2VASc = 6-->Pradaxa; c. 09/2017 Successful DCCV (second shock - 200J); d. 10/2017 Recurrent Afib-->flecainide started 11/2017; e. 06/2020 s/p DCCV (200J x 1); f. 06/2020 Recurrent AF->Flec inc 75bid; g. 09/2020 WCT->flec d/c'd->amio started.  . Personal history of radiation therapy 2001   left breast ca  . Psoriasis   . Wide-complex tachycardia ([place])    a. 09/2020 in setting of presumed Flecainide toxicity.  Flecainide d/c'd.   Past Surgical History:  Procedure Laterality Date  . ABDOMINAL HYSTERECTOMY    . ANKLE FRACTURE SURGERY  4/08   left---hardware still in place  . BREAST BIOPSY Left 2001   breast ca  . BREAST EXCISIONAL BIOPSY Left yrs ago   benign  .  BREAST LUMPECTOMY Left 2001   f/u radiation  . CARDIOVERSION N/A 10/04/2017   Procedure: CARDIOVERSION;  Surgeon: Wellington Hampshire, MD;  Location: [facility];  Service: Cardiovascular;  Laterality: N/A;  . CARDIOVERSION N/A 12/16/2017   Procedure: CARDIOVERSION;  Surgeon: [name], MD;  Location: [facility];  Service: Cardiovascular;  Laterality: N/A;  . CARDIOVERSION N/A 06/24/2020   Procedure: CARDIOVERSION;  Surgeon: Wellington Hampshire, MD;  Location: [facility];  Service: Cardiovascular;  Laterality: N/A;  . CAROTID ENDARTERECTOMY Left 10/23/2004  . FRACTURE SURGERY    . OOPHORECTOMY    . SHOULDER SURGERY  6/07   left  . TONSILLECTOMY AND ADENOIDECTOMY    . TOTAL HIP ARTHROPLASTY  2004   right    Allergies  Allergies  Allergen Reactions  . Adhesive [Tape] Other (See Comments)    "Does not work well with me. Can take my skin off. Careful"    History of Present Illness    80 y/o female with medical history as noted above and including HTN, hyperlipdemia,carotid artery disease s/p left carotid endarterectomy, left breast cancer, osteopenia, and GERD. She was found to be in rapid atrial fibrillation in February 2019 upon admission to [facility] w/progressive lower extremity swelling and dyspnea over several months. She was placed on  blocker, diltiazem, and Xarelto therapy. She underwent cardioversion in March 2019 but was back  in a fib at time of follow-up April 2019 and was placed on flecainide and rivaroxaban therapy, but later switched to dabigatran. Echo in Aug 2021 showed normal LV fxn (60-65%) and G2DD. In Dec 2021, she was noted to be back in a fib with associated dyspnea on exertion. She was advised to reduce alcohol intake and underwent successful cardioversion on December 6 but at follow-up post cardioversion, she was again noted to be in atrial fibrillation and flecainide was increased to 75 mg twice daily. At EP follow-up in late January 2022, she had converted to sinus rhythm. In  February 2022, lasix therapy was discontinued in the setting of elevated creatinine of 1.77 and associated dry mouth and itching. Following discontinuation, she had worsening dyspnea on exertion with subsequent 10 pound weight gain prompting her to go to [facility] ED on March 2 where she was found to be volume overloaded. She responded well to IV Lasix and was initially thought to be stable for early discharge but she subsequently developed symptomatic wide-complex tachycardia that initially broke with an amiodarone bolus. This was followed by junctional bradycardia. She was placed on a dopamine gtt and admitted to the ICU. In the ICU, she had recurrent episodes of symptomatic WCT requiring defibrillation. Her case was reviewed with electrophysiology and it was felt that the [facility] might represent SVT w/class Ic antiarrhythmic effect and as a result, flecainide was discontinued. She was transferred to [facility] for more formal EP evaluation, and she had no further WCT and was subsequently discharged on March 6 on oral amio.   Since hospital discharge, she has been feeling well and has not problems tolerating amiodarone. Since her last visit in March, there have been phone calls with the staff regarding her Lasix dose and after an elevation in her K+ and creatinine on 3/28, she has reduced her dose to 20 mg every other day. She seems to be tolerating this dose well. She has been purposefully trying to increase her food intake because she does not have a good appetite. She denies chest pain, dyspnea, syncope, dizziness, palpitations, edema, PND, orthopnea, or early satiety. She is here alone today.  She continues to drink 1-2 alcoholic beverages daily.  Home Medications    Prior to Admission medications   Medication Sig Start Date End Date Taking? Authorizing Provider  acetaminophen (TYLENOL) 325 MG tablet Take 2 tablets (650 mg total) by mouth every 4 (four) hours as needed for headache or mild pain. 09/19/20    Swayze, Ava, DO  allopurinol (ZYLOPRIM) 100 MG tablet Take 100 mg by mouth daily.    [provider]  amiodarone (PACERONE) 200 MG tablet Take 200 mg by mouth daily.    [provider]  amiodarone (PACERONE) 400 MG tablet Take 1 tablet (400 mg total) by mouth 2 (two) times daily for 14 days. 09/21/20 10/05/20  Kathyrn Drown D, NP  amLODipine (NORVASC) 5 MG tablet Take 1 tablet (5 mg total) by mouth daily. 09/22/20   Kathyrn Drown D, NP  amoxicillin (AMOXIL) 500 MG capsule Take 500 mg by mouth See admin instructions. Take 4 tablets 1 hour prior to dental appointment    [provider]  Chlorhexidine Gluconate Cloth 2 % PADS Apply 6 each topically daily. 09/19/20   Swayze, Ava, DO  cholecalciferol (VITAMIN D) 25 MCG (1000 UNIT) tablet Take 2 tablets (2,000 Units total) by mouth daily. 09/20/20   Swayze, Ava, DO  dabigatran (PRADAXA) 150 MG CAPS capsule Take 1 capsule (150 mg  total) by mouth 2 (two) times daily. 09/19/20   Swayze, Ava, DO  furosemide (LASIX) 40 MG tablet Take 0.5 tablet (20 mg) by mouth every other day.    [provider]  LORazepam (ATIVAN) 0.5 MG tablet Take 0.5 mg by mouth 2 (two) times daily as needed for anxiety. Takes very rarely-about twice per month    [provider]  losartan (COZAAR) 50 MG tablet Take 1 tablet (50 mg total) by mouth daily. 09/23/20   [name], MD  mirtazapine (REMERON) 15 MG tablet Take 1 tablet (15 mg total) by mouth at bedtime. 09/19/20   Swayze, Ava, DO  Multiple Vitamin (MULTIVITAMIN) capsule Take 1 capsule by mouth daily.    [provider]  Multiple Vitamins-Minerals (PRESERVISION AREDS 2+MULTI VIT PO) Take 1 capsule by mouth in the morning and at bedtime.    [provider]    Review of Systems    She denies chest pain, palpitations, dyspnea, pnd, orthopnea, n/v/d, dizziness, lightheadedness, syncope, edema, weight gain, or early satiety.  All other systems reviewed and are otherwise negative  except as noted above.  Physical Exam    VS:  BP 116/80 (BP Location: Left Arm, Patient Position: Sitting, Cuff Size: Normal)   Pulse 62   Ht 5\' 7"  (1.702 m)   Wt 119 lb 2 oz (54 kg)   SpO2 96%   BMI 18.66 kg/m  , BMI Body mass index is 18.66 kg/m. GEN: Thin, somewhat frail, in no acute distress. HEENT: normal. Neck: Supple, no JVD, carotid bruits, or masses. Cardiac: RRR, with occasional ectopic beat, no murmurs, rubs, or gallops. No clubbing or cyanosis.  Trace, left greater than right lower extremity swelling.  Radials/DP/PT 2+ and equal bilaterally.  Respiratory:  Respirations regular and unlabored, clear to auscultation bilaterally. GI: Soft, nontender, nondistended, BS + x 4. MS: no deformity or atrophy. Skin: warm and dry, no rash. Neuro:  Strength and sensation are intact. Psych: Normal affect.  Accessory Clinical Findings    ECG personally reviewed by me today - RSR @ 62, PVCs - no acute changes.  Lab Results  Component Value Date   WBC 7.8 09/20/2020   HGB 11.0 (L) 09/20/2020   HCT 33.2 (L) 09/20/2020   MCV 110.7 (H) 09/20/2020   PLT 236 09/20/2020   Lab Results  Component Value Date   CREATININE 1.29 (H) 10/14/2020   BUN 30 (H) 10/14/2020   NA 138 10/14/2020   K 4.8 10/14/2020   CL 102 10/14/2020   CO2 25 10/14/2020   Lab Results  Component Value Date   ALT 35 09/22/2020   AST 24 09/22/2020   ALKPHOS 140 (H) 09/22/2020   BILITOT 0.8 09/22/2020   Lab Results  Component Value Date   CHOL 216 (H) 01/11/2019   HDL 88.90 01/11/2019   LDLCALC 104 (H) 01/11/2019   LDLDIRECT 151.4 02/01/2012   TRIG 116.0 01/11/2019   CHOLHDL 2 01/11/2019     Assessment & Plan    1.  Wide-complex tachycardia/SVT with class Ic affect: History of A. fib previously on flecainide with admission to [facility] and subsequent transfer to [facility] secondary to wide-complex tachycardia.  Quiescent off of flecainide and she remains on amiodarone.   2. Persistent atrial fibrillation:  Maintaining sinus rhythm now and tolerating amiodarone therapy following discontinuation of flecainide during hospitalization in March. She has no concerns today and feels almost back to baseline although she gets frustrated that she does not have the amount of energy  she used to have.  Reports good compliance with Pradaxa and amiodarone.  Still drinking 1-2 alcoholic beverages nightly.  3. Chronic HFpEF: She is keeping track of her weight and BP at home but did not bring that information with her today. She has reduced her Lasix to 20 mg every other day since having elevated creatinine and K+ on 10/14/20. She may have an increase of 1-2 lbs weight gain because she has been more diligent to eat regularly through the day as she previously was going long periods of time without eating. She does have trace to 1+ 1+ pitting edema in the LLE but otherwise appears euvolemic on exam. Plan to continue diuretic at current dose. Checking bmet today and will make further adjustments based on results.  4. Essential hypertension: BP stable today. She is tracking BP at home and reports low reading yesterday. She cannot recall numbers specifically but denies lightheadedness, dizziness, or pre-syncope. She will continue to monitor. Continue ARB, CCB, diuretic.   5. Hyperlipidemia: LDL  104 in June 2020. She has previously refused statins.  6. Alcohol abuse: She continues to drink 1-2 alcoholic drinks per day. Complete cessation advised.   7.  AKI: Previously discharged on Lasix 20 mg daily.  Creatinine rose to 1.29 on March 28 and she has been taking it every other day since.  Follow-up basic metabolic panel today.  8.  Disposition: Follow up basic metabolic panel today. Return in 3 months to see Dr. Caryl Comes, sooner if needed.    Murray Hodgkins, NP 10/29/2020, 1:08 PM

## 2020-10-30 LAB — BASIC METABOLIC PANEL
BUN/Creatinine Ratio: 23 (ref 12–28)
BUN: 25 mg/dL (ref 8–27)
CO2: 21 mmol/L (ref 20–29)
Calcium: 10 mg/dL (ref 8.7–10.3)
Chloride: 106 mmol/L (ref 96–106)
Creatinine, Ser: 1.11 mg/dL — ABNORMAL HIGH (ref 0.57–1.00)
Glucose: 89 mg/dL (ref 65–99)
Potassium: 5.2 mmol/L (ref 3.5–5.2)
Sodium: 143 mmol/L (ref 134–144)
eGFR: 50 mL/min/{1.73_m2} — ABNORMAL LOW (ref >59)

## 2020-11-05 ENCOUNTER — Other Ambulatory Visit: Payer: Self-pay | Admitting: Internal Medicine

## 2020-11-06 NOTE — Telephone Encounter (Signed)
This is a [place] pt 

## 2020-11-11 ENCOUNTER — Ambulatory Visit: Payer: Medicare Other | Admitting: Podiatry

## 2020-11-14 ENCOUNTER — Encounter: Payer: Self-pay | Admitting: Podiatry

## 2020-11-14 ENCOUNTER — Other Ambulatory Visit: Payer: Self-pay

## 2020-11-14 ENCOUNTER — Ambulatory Visit (INDEPENDENT_AMBULATORY_CARE_PROVIDER_SITE_OTHER): Payer: Medicare Other | Admitting: Podiatry

## 2020-11-14 DIAGNOSIS — I872 Venous insufficiency (chronic) (peripheral): Secondary | ICD-10-CM | POA: Diagnosis not present

## 2020-11-14 DIAGNOSIS — B351 Tinea unguium: Secondary | ICD-10-CM | POA: Diagnosis not present

## 2020-11-14 DIAGNOSIS — M79676 Pain in unspecified toe(s): Secondary | ICD-10-CM

## 2020-11-14 DIAGNOSIS — D689 Coagulation defect, unspecified: Secondary | ICD-10-CM | POA: Diagnosis not present

## 2020-11-14 NOTE — Progress Notes (Signed)
This patient returns to my office for at risk foot care.  This patient requires this care by a professional since this patient will be at risk due to having lymphedema.  This patient is unable to cut nails herself since the patient cannot reach her nails.These nails are painful walking and wearing shoes.  This patient presents for at risk foot care today.  General Appearance  Alert, conversant and in no acute stress.  Vascular  Dorsalis pedis and posterior tibial  pulses are palpable  bilaterally.  Capillary return is within normal limits  bilaterally. Temperature is within normal limits  bilaterally.  Neurologic  Senn-Weinstein monofilament wire test within normal limits  bilaterally. Muscle power within normal limits bilaterally.  Nails Thick disfigured discolored nails with subungual debris  from hallux to fifth toes bilaterally. No evidence of bacterial infection or drainage bilaterally.  Orthopedic  No limitations of motion  feet .  No crepitus or effusions noted.  No bony pathology or digital deformities noted.  HAV  B/L.  Mallet toe third toe left foot.  Skin  normotropic skin with no porokeratosis noted bilaterally.  No signs of infections or ulcers noted.     Onychomycosis  Pain in right toes  Pain in left toes  Consent was obtained for treatment procedures.   Mechanical debridement of nails 1-5  bilaterally performed with a nail nipper.  Filed with dremel without incident.  Dispense crest pad.   Return office visit    3 months                  Told patient to return for periodic foot care and evaluation due to potential at risk complications.   Gardiner Barefoot DPM

## 2020-11-18 DIAGNOSIS — Z23 Encounter for immunization: Secondary | ICD-10-CM | POA: Diagnosis not present

## 2020-11-19 ENCOUNTER — Other Ambulatory Visit: Payer: Self-pay | Admitting: Internal Medicine

## 2020-11-26 ENCOUNTER — Telehealth: Payer: Self-pay | Admitting: Internal Medicine

## 2020-11-26 NOTE — Telephone Encounter (Signed)
The patient was last seen in clinic on 10/29/20 with Dana Bayley, NP. The patient was documented as taking Pradaxa 150 mg BID.  No medication changes made at that appointment.  Will forward to anticoagulation team to make sure she is still on the appropriate dose prior to calling her back.  I am unsure what has prompted her call today.

## 2020-11-26 NOTE — Telephone Encounter (Signed)
I called and spoke with the patient. She states she received a message from our office to call and go over her medications or call regarding her Pradaxa. I advised the patient I had not sent out a message in this regard and that she is currently on the correct dose of Pradaxa.  I have advised her to continue her current medications as prescribed.   The patient voices understanding and is agreeable.

## 2020-11-26 NOTE — Telephone Encounter (Signed)
Patient calling wants to clarify if any changes to pradaxa, please assist.

## 2020-11-26 NOTE — Telephone Encounter (Signed)
150 mg Twice DailyPatients with CrCI >30 NO CHANGE ON PRADAXA   mL/min81F, 54KG, SCR 1.11 10/29/20, CCR 33.9, LOVW/BERGE ON 10/29/20.

## 2020-11-27 ENCOUNTER — Other Ambulatory Visit: Payer: Self-pay

## 2020-12-03 ENCOUNTER — Encounter: Payer: Self-pay | Admitting: Family Medicine

## 2020-12-03 ENCOUNTER — Ambulatory Visit (INDEPENDENT_AMBULATORY_CARE_PROVIDER_SITE_OTHER): Payer: Medicare Other | Admitting: Family Medicine

## 2020-12-03 ENCOUNTER — Other Ambulatory Visit: Payer: Self-pay

## 2020-12-03 DIAGNOSIS — I10 Essential (primary) hypertension: Secondary | ICD-10-CM

## 2020-12-03 DIAGNOSIS — I480 Paroxysmal atrial fibrillation: Secondary | ICD-10-CM

## 2020-12-03 DIAGNOSIS — R0609 Other forms of dyspnea: Secondary | ICD-10-CM

## 2020-12-03 DIAGNOSIS — M1A0491 Idiopathic chronic gout, unspecified hand, with tophus (tophi): Secondary | ICD-10-CM

## 2020-12-03 DIAGNOSIS — R0600 Dyspnea, unspecified: Secondary | ICD-10-CM | POA: Diagnosis not present

## 2020-12-03 NOTE — Assessment & Plan Note (Signed)
Sinus rhythm today.  She will see her cardiologist later this week.  She will continue Pradaxa for reduction of stroke risk.

## 2020-12-03 NOTE — Progress Notes (Signed)
[name], MD Phone: [phone]  [name] is a 80 y.o. female who presents today for f/u.  HYPERTENSION  Disease Monitoring  Home BP Monitoring <150/70, generally low 140s over 60s or better chest pain- no    Dyspnea- yes, see below Medications  Compliance-  Taking amlodipine, losartan, lasix, metoprolol edema- intermittent, resolves over night, no orthopnea or PND  Dyspnea on exertion: This has been an ongoing issue though she feels as though its worse over the last year or so.  It improves quite quickly when she stops moving.  No diaphoresis with this.  No chest pain.  No orthopnea or PND.  She has follow-up with cardiology later this week.    Arthritis: Patient follows with rheumatology once yearly.  She notes not much pain in her hands.  She is on allopurinol as gout is playing a role with this.  Social History   Tobacco Use  Smoking Status Former Smoker  . Packs/day: 1.00  . Years: 40.00  . Pack years: 40.00  . Types: Cigarettes  . Quit date: 07/21/1995  . Years since quitting: 25.3  Smokeless Tobacco Never Used    Current Outpatient Medications on File Prior to Visit  Medication Sig Dispense Refill  . acetaminophen (TYLENOL) 325 MG tablet Take 2 tablets (650 mg total) by mouth every 4 (four) hours as needed for headache or mild pain.    Marland Kitchen allopurinol (ZYLOPRIM) 100 MG tablet Take 100 mg by mouth daily.    Marland Kitchen amiodarone (PACERONE) 200 MG tablet Take 200 mg by mouth daily.    Marland Kitchen amLODipine (NORVASC) 5 MG tablet Take 1 tablet (5 mg total) by mouth daily. 90 tablet 2  . amoxicillin (AMOXIL) 500 MG capsule Take 500 mg by mouth See admin instructions. Take 4 tablets 1 hour prior to dental appointment    . Chlorhexidine Gluconate Cloth 2 % PADS Apply 6 each topically daily.    . cholecalciferol (VITAMIN D) 25 MCG (1000 UNIT) tablet Take 2 tablets (2,000 Units total) by mouth daily.    . flecainide (TAMBOCOR) 50 MG tablet Take 75 mg by mouth 2 (two) times daily.     . furosemide (LASIX) 40 MG tablet Take 0.5 tablet (20 mg) by mouth every other day.    Marland Kitchen LORazepam (ATIVAN) 0.5 MG tablet Take 0.5 mg by mouth 2 (two) times daily as needed for anxiety. Takes very rarely-about twice per month    . losartan (COZAAR) 50 MG tablet TAKE 1 TABLET BY MOUTH EVERY DAY 30 tablet 3  . metoprolol tartrate (LOPRESSOR) 50 MG tablet Take 50 mg by mouth daily.    . mirtazapine (REMERON) 15 MG tablet Take 1 tablet (15 mg total) by mouth at bedtime.    . Multiple Vitamin (MULTIVITAMIN) capsule Take 1 capsule by mouth daily.    . Multiple Vitamins-Minerals (PRESERVISION AREDS 2+MULTI VIT PO) Take 1 capsule by mouth in the morning and at bedtime.    Marland Kitchen PRADAXA 150 MG CAPS capsule TAKE 1 CAPSULE BY MOUTH TWICE A DAY 180 capsule 3   No current facility-administered medications on file prior to visit.     ROS see history of present illness  Objective  Physical Exam Vitals:   12/03/20 1106  BP: 140/80  Pulse: (!) 55  Temp: 98 F (36.7 C)  SpO2: 96%    BP Readings from Last 3 Encounters:  12/03/20 140/80  10/29/20 116/80  09/27/20 110/80   Wt Readings from Last 3 Encounters:  12/03/20 120 lb (  54.4 kg)  10/29/20 119 lb 2 oz (54 kg)  09/27/20 118 lb (53.5 kg)    Physical Exam Constitutional:      General: She is not in acute distress.    Appearance: She is not diaphoretic.  Cardiovascular:     Rate and Rhythm: Normal rate and regular rhythm.     Heart sounds: Normal heart sounds.  Pulmonary:     Effort: Pulmonary effort is normal.     Breath sounds: Normal breath sounds.  Musculoskeletal:     Right lower leg: No edema.     Left lower leg: No edema.  Skin:    General: Skin is warm and dry.  Neurological:     Mental Status: She is alert.      Assessment/Plan: Please see individual problem list.  Problem List Items Addressed This Visit    HTN (hypertension)    Generally well controlled for her age.  She will continue her amlodipine 5 mg once daily,  Lasix 20 mg every other day, metoprolol 50 mg daily, and losartan 50 mg daily.      Relevant Medications   flecainide (TAMBOCOR) 50 MG tablet   metoprolol tartrate (LOPRESSOR) 50 MG tablet   Atrial fibrillation (HCC)    Sinus rhythm today.  She will see her cardiologist later this week.  She will continue Pradaxa for reduction of stroke risk.      Relevant Medications   flecainide (TAMBOCOR) 50 MG tablet   metoprolol tartrate (LOPRESSOR) 50 MG tablet   Gout    Encouraged to stay on allopurinol.  We will check with her cardiologist to see if they are planning on checking any labs and then will arrange for a uric acid to be checked.      DOE (dyspnea on exertion)    This is a chronic ongoing issue that seems to have worsened over the last year.  Recent CBC with relatively stable to improved hemoglobin.  TSH has been acceptable recently.  She will keep her appointment with cardiology later this week to get their input on possible further evaluation.  Return precautions given in AVS.         Return in about 3 months (around 03/05/2021).  This visit occurred during the SARS-CoV-2 public health emergency.  Safety protocols were in place, including screening questions prior to the visit, additional usage of staff PPE, and extensive cleaning of exam room while observing appropriate contact time as indicated for disinfecting solutions.    [name], MD [place]

## 2020-12-03 NOTE — Assessment & Plan Note (Signed)
Encouraged to stay on allopurinol.  We will check with her cardiologist to see if they are planning on checking any labs and then will arrange for a uric acid to be checked.

## 2020-12-03 NOTE — Assessment & Plan Note (Addendum)
Generally well controlled for her age.  She will continue her amlodipine 5 mg once daily, Lasix 20 mg every other day, metoprolol 50 mg daily, and losartan 50 mg daily.

## 2020-12-03 NOTE — Assessment & Plan Note (Addendum)
This is a chronic ongoing issue that seems to have worsened over the last year.  Recent CBC with relatively stable to improved hemoglobin.  TSH has been acceptable recently.  She will keep her appointment with cardiology later this week to get their input on possible further evaluation.  Return precautions given in AVS.

## 2020-12-03 NOTE — Patient Instructions (Addendum)
Nice to see you. I am going to check with your cardiologist regarding lab work.  We will let you know if we need you to come back for labs. If you develop persistent shortness of breath or if you develop chest pain you need to seek medical attention immediately.

## 2020-12-05 ENCOUNTER — Ambulatory Visit: Payer: Medicare Other | Admitting: Internal Medicine

## 2020-12-12 ENCOUNTER — Telehealth: Payer: Self-pay | Admitting: Family Medicine

## 2020-12-12 DIAGNOSIS — M1A9XX1 Chronic gout, unspecified, with tophus (tophi): Secondary | ICD-10-CM

## 2020-12-12 NOTE — Telephone Encounter (Signed)
-----   Message from [name], MD sent at 12/10/2020  6:08 PM EDT ----- I DONT see her on my schedule until 7/7  ----- Message ----- From: [name], MD Sent: 12/03/2020  11:29 AM EDT To: [name], MD  Hey Dr Caryl Comes,   I saw [name] today for follow-up. She noted some progressive dyspnea on exertion that has worsened some over the past year. Her recent hemoglobin was relatively stable and her TSH was normal. She sounded to be in sinus rhythm today. I wanted to make you aware as she is seeing you later this week. I discussed doing labs on her today to follow-up on her gout though she was hesitant to do so as she thought you might be doing some blood work. If you are doing labs is there a way for me to get a uric acid through your office? If you are not doing labs we can easily have her return to our office. Thanks.  Randall Hiss

## 2020-12-12 NOTE — Telephone Encounter (Signed)
Please let the patient know I heard back from Dr. Caryl Comes and it does not look like she is scheduled to see him for another month or 2.  I would like her to come back to our office to have her uric acid checked for her gout.  Thanks.

## 2020-12-12 NOTE — Telephone Encounter (Signed)
Patient scheduled to have checked next Tuesday 5/31 @ 2:15p.

## 2020-12-16 ENCOUNTER — Other Ambulatory Visit: Payer: Self-pay | Admitting: Family Medicine

## 2020-12-17 ENCOUNTER — Other Ambulatory Visit (INDEPENDENT_AMBULATORY_CARE_PROVIDER_SITE_OTHER): Payer: Medicare Other

## 2020-12-17 ENCOUNTER — Other Ambulatory Visit: Payer: Self-pay

## 2020-12-17 DIAGNOSIS — M1A9XX1 Chronic gout, unspecified, with tophus (tophi): Secondary | ICD-10-CM

## 2020-12-17 LAB — URIC ACID: Uric Acid, Serum: 6.3 mg/dL (ref 2.4–7.0)

## 2020-12-17 NOTE — Telephone Encounter (Signed)
Last filled by Cardiology okay to fill last OV 12/03/20

## 2020-12-18 ENCOUNTER — Telehealth: Payer: Self-pay | Admitting: *Deleted

## 2020-12-18 ENCOUNTER — Other Ambulatory Visit: Payer: Self-pay | Admitting: *Deleted

## 2020-12-18 LAB — PTH, INTACT AND CALCIUM
Calcium: 9.4 mg/dL (ref 8.6–10.4)
PTH: 91 pg/mL — ABNORMAL HIGH (ref 16–77)

## 2020-12-18 LAB — EXTRA LAV TOP TUBE

## 2020-12-18 NOTE — Telephone Encounter (Signed)
-----   Message from [name], MD sent at 12/18/2020 12:22 PM EDT ----- Please let the patient know that her uric acid level is not quite at goal. I would suggest increasing her allopurinol and rechecking her labs in 3 weeks. I can send the new dose in once you talk to her. Her parathyroid hormone level is elevated. This is likely related to her chronic kidney disease or to vitamin D deficiency. We will check a vitamin D level with her labs in 3 weeks.

## 2020-12-18 NOTE — Telephone Encounter (Signed)
Left  Message to call office.

## 2020-12-23 ENCOUNTER — Telehealth: Payer: Self-pay | Admitting: Family Medicine

## 2020-12-23 DIAGNOSIS — E213 Hyperparathyroidism, unspecified: Secondary | ICD-10-CM

## 2020-12-23 DIAGNOSIS — M1A9XX1 Chronic gout, unspecified, with tophus (tophi): Secondary | ICD-10-CM

## 2020-12-23 DIAGNOSIS — E349 Endocrine disorder, unspecified: Secondary | ICD-10-CM

## 2020-12-23 NOTE — Telephone Encounter (Signed)
I would like to increase her allopurinol to 150 mg once daily.  She will need labs about 3 weeks after increasing this.

## 2020-12-23 NOTE — Telephone Encounter (Signed)
Called and spoke with patient and she is okay with the increase to the allopurinol, you can send a prescriptions to pharmacy and she is scheduled to check labs in 3 weeks.  Maxamillion Banas,cma

## 2020-12-23 NOTE — Telephone Encounter (Signed)
Pt returned a call from the office.

## 2020-12-23 NOTE — Telephone Encounter (Signed)
PT called to inquire about allopurinol (ZYLOPRIM) 100 MG tablet. PT states that it was suppose to be increased in dose but she never heard anything back from when she last call Thursday and would like to find out what is going on.

## 2020-12-24 MED ORDER — ALLOPURINOL 100 MG PO TABS: 150.0000 mg | ORAL_TABLET | Freq: Every day | ORAL | 1 refills | Status: DC

## 2020-12-24 NOTE — Addendum Note (Signed)
Addended by: [name] on: 12/24/2020 06:56 PM   Modules accepted: Orders

## 2020-12-24 NOTE — Telephone Encounter (Signed)
Sent to pharmacy. Orders placed.

## 2021-01-07 ENCOUNTER — Telehealth: Payer: Self-pay | Admitting: Family Medicine

## 2021-01-07 NOTE — Telephone Encounter (Signed)
PT called to advise that she has a sinus infection and would like to see if she can have something called in for it such as a antibiotic and would like to talk to Gae Bon if possible.

## 2021-01-07 NOTE — Telephone Encounter (Signed)
Called patient and she is scheduled. [name],cma

## 2021-01-09 ENCOUNTER — Ambulatory Visit: Payer: Medicare Other | Admitting: Adult Health

## 2021-01-09 ENCOUNTER — Encounter: Payer: Self-pay | Admitting: Physician Assistant

## 2021-01-09 ENCOUNTER — Other Ambulatory Visit: Payer: Self-pay

## 2021-01-09 ENCOUNTER — Ambulatory Visit (HOSPITAL_COMMUNITY)
Admission: RE | Admit: 2021-01-09 | Discharge: 2021-01-09 | Disposition: A | Discharge status: Home / Self Care | Payer: Medicare Other | Source: Ambulatory Visit | Attending: Vascular Surgery | Admitting: Vascular Surgery

## 2021-01-09 ENCOUNTER — Ambulatory Visit (INDEPENDENT_AMBULATORY_CARE_PROVIDER_SITE_OTHER): Payer: Medicare Other | Admitting: Physician Assistant

## 2021-01-09 VITALS — BP 153/73 | HR 61 | Temp 98.0°F | Resp 20 | Ht 67.0 in | Wt 119.6 lb

## 2021-01-09 DIAGNOSIS — I6523 Occlusion and stenosis of bilateral carotid arteries: Secondary | ICD-10-CM

## 2021-01-09 NOTE — Progress Notes (Signed)
History of Present Illness:  Patient is a 80 y.o. year old female who presents for evaluation of carotid stenosis.  She is s/p left CEA by Dr. Scot Dock on 10/23/2004.   The patient denies symptoms of TIA, amaurosis, or stroke.    She has hx of afib and on Pradaxa.  She is not on a statin b/c her cholesterol numbers are good.  She is not on an aspirin bc she is on Pradaxa and they had taken her off of aspirin in the past due to bleeding.     Past Medical History:  Diagnosis Date   (HFpEF) heart failure with preserved ejection fraction ([place])    a. 08/2017 Echo: EF 55-60%, no rwma, mild MR, mildly dil LA, nl RV fxn; b. 03/2020 Echo: EF 60-65%, no rwma, Gr2 DD. RVSP 44.38mmHg. Mod dil LA. Mild MR; c. 09/2020 Echo: EF 50-55%, no rwma, mild LVH, mod red RV fxn, mildly dily RA.   Acute CHF (congestive heart failure) ([place]) 09/18/2020   Breast cancer ([place]) 2001   left breast   Cancer ([place]) 2001   left breast ca   Carotid arterial disease ([place])    a. 10/2004 s/p L CEA; b. 12/2015 Carotid U/S: RICA 1-39%; b. LICA patent CEA site.   Closed right hip fracture ([place]) 09/20/2019   GERD (gastroesophageal reflux disease)    Hyperlipidemia    Hypertension    Osteoarthritis, multiple sites    Osteopenia    Persistent atrial fibrillation ([place])    a. Dx 08/2017; b. CHA2DS2VASc = 6-->Pradaxa; c. 09/2017 Successful DCCV (second shock - 200J); d. 10/2017 Recurrent Afib-->flecainide started 11/2017; e. 06/2020 s/p DCCV (200J x 1); f. 06/2020 Recurrent AF->Flec inc 75bid; g. 09/2020 WCT->flec d/c'd->amio started.   Personal history of radiation therapy 2001   left breast ca   Psoriasis    Wide-complex tachycardia ([place])    a. 09/2020 in setting of presumed Flecainide toxicity.  Flecainide d/c'd.    Past Surgical History:  Procedure Laterality Date   ABDOMINAL HYSTERECTOMY     ANKLE FRACTURE SURGERY  4/08   left---hardware still in place   BREAST BIOPSY Left 2001   breast ca   BREAST EXCISIONAL BIOPSY Left yrs ago    benign   BREAST LUMPECTOMY Left 2001   f/u radiation   CARDIOVERSION N/A 10/04/2017   Procedure: CARDIOVERSION;  Surgeon: Wellington Hampshire, MD;  Location: [facility];  Service: Cardiovascular;  Laterality: N/A;   CARDIOVERSION N/A 12/16/2017   Procedure: CARDIOVERSION;  Surgeon: [name], MD;  Location: [facility];  Service: Cardiovascular;  Laterality: N/A;   CARDIOVERSION N/A 06/24/2020   Procedure: CARDIOVERSION;  Surgeon: Wellington Hampshire, MD;  Location: [facility];  Service: Cardiovascular;  Laterality: N/A;   CAROTID ENDARTERECTOMY Left 10/23/2004   FRACTURE SURGERY     OOPHORECTOMY     SHOULDER SURGERY  6/07   left   TONSILLECTOMY AND ADENOIDECTOMY     TOTAL HIP ARTHROPLASTY  2004   right     Social History Social History   Tobacco Use   Smoking status: Former    Packs/day: 1.00    Years: 40.00    Pack years: 40.00    Types: Cigarettes    Quit date: 07/21/1995    Years since quitting: 25.4   Smokeless tobacco: Never  Vaping Use   Vaping Use: Never used  Substance Use Topics   Alcohol use: Yes    Alcohol/week: 2.0 standard drinks  Types: 1 Glasses of wine, 1 Standard drinks or equivalent per week    Comment: 1 glass of wine and 1 drink of liquor daily   Drug use: No    Family History Family History  Problem Relation Age of Onset   Hypertension Brother    Pneumonia Mother    Leukemia Father    Heart disease Neg Hx    Diabetes Neg Hx    Breast cancer Neg Hx     Allergies  Allergies  Allergen Reactions   Adhesive [Tape] Other (See Comments)    "Does not work well with me. Can take my skin off. Careful"     Current Outpatient Medications  Medication Sig Dispense Refill   acetaminophen (TYLENOL) 325 MG tablet Take 2 tablets (650 mg total) by mouth every 4 (four) hours as needed for headache or mild pain.     allopurinol (ZYLOPRIM) 100 MG tablet Take 1.5 tablets (150 mg total) by mouth daily. 135 tablet 1   amiodarone (PACERONE) 200 MG tablet Take 200  mg by mouth daily.     amLODipine (NORVASC) 5 MG tablet Take 1 tablet (5 mg total) by mouth daily. 90 tablet 2   amoxicillin (AMOXIL) 500 MG capsule Take 500 mg by mouth See admin instructions. Take 4 tablets 1 hour prior to dental appointment     Chlorhexidine Gluconate Cloth 2 % PADS Apply 6 each topically daily.     cholecalciferol (VITAMIN D) 25 MCG (1000 UNIT) tablet Take 2 tablets (2,000 Units total) by mouth daily.     flecainide (TAMBOCOR) 50 MG tablet Take 75 mg by mouth 2 (two) times daily.     furosemide (LASIX) 40 MG tablet Take 0.5 tablet (20 mg) by mouth every other day.     LORazepam (ATIVAN) 0.5 MG tablet TAKE 1 TABLET BY MOUTH TWICE A DAY AS NEEDED FOR ANXIETY 60 tablet 0   losartan (COZAAR) 50 MG tablet TAKE 1 TABLET BY MOUTH EVERY DAY 30 tablet 3   metoprolol tartrate (LOPRESSOR) 50 MG tablet Take 50 mg by mouth daily.     mirtazapine (REMERON) 15 MG tablet Take 1 tablet (15 mg total) by mouth at bedtime.     Multiple Vitamin (MULTIVITAMIN) capsule Take 1 capsule by mouth daily.     Multiple Vitamins-Minerals (PRESERVISION AREDS 2+MULTI VIT PO) Take 1 capsule by mouth in the morning and at bedtime.     PRADAXA 150 MG CAPS capsule TAKE 1 CAPSULE BY MOUTH TWICE A DAY 180 capsule 3   No current facility-administered medications for this visit.    ROS:   General:  No weight loss, Fever, chills  HEENT: No recent headaches, no nasal bleeding, no visual changes, no sore throat  Neurologic: No dizziness, blackouts, seizures. No recent symptoms of stroke or mini- stroke. No recent episodes of slurred speech, or temporary blindness.  Cardiac: No recent episodes of chest pain/pressure, no shortness of breath at rest.  No shortness of breath with exertion.  Denies history of atrial fibrillation or irregular heartbeat  Vascular: No history of rest pain in feet.  No history of claudication.  No history of non-healing ulcer, No history of DVT   Pulmonary: No home oxygen, no  productive cough, no hemoptysis,  No asthma or wheezing  Musculoskeletal:  [ ]  Arthritis, [ ]  Low back pain,  [ ]  Joint pain  Hematologic:No history of hypercoagulable state.  No history of easy bleeding.  No history of anemia  Gastrointestinal: No hematochezia or melena,  No gastroesophageal reflux, no trouble swallowing  Urinary: [ ]  chronic Kidney disease, [ ]  on HD - [ ]  MWF or [ ]  TTHS, [ ]  Burning with urination, [ ]  Frequent urination, [ ]  Difficulty urinating;   Skin: No rashes  Psychological: No history of anxiety,  No history of depression   Physical Examination  There were no vitals filed for this visit.  There is no height or weight on file to calculate BMI.  General:  Alert and oriented, no acute distress HEENT: Normal Neck: No bruit or JVD Pulmonary: Clear to auscultation bilaterally Cardiac: Regular Rate and Rhythm without murmur Gastrointestinal: Soft, non-tender, non-distended, no mass, no scars Skin: No rash Musculoskeletal: No deformity or edema  Neurologic: Upper and lower extremity motor 5/5 and symmetric  DATA:     Right Carotid Findings:  +----------+--------+--------+--------+------------------+--------+            PSV cm/sEDV cm/sStenosisPlaque DescriptionComments  +----------+--------+--------+--------+------------------+--------+  CCA Prox  74      12                                          +----------+--------+--------+--------+------------------+--------+  CCA Mid   85      20                                          +----------+--------+--------+--------+------------------+--------+  CCA Distal78      17                                          +----------+--------+--------+--------+------------------+--------+  ICA Prox  73      22      1-39%   calcific                    +----------+--------+--------+--------+------------------+--------+  ICA Mid   90      28              calcific                     +----------+--------+--------+--------+------------------+--------+  ICA Distal73      19                                          +----------+--------+--------+--------+------------------+--------+  ECA       91      9                                           +----------+--------+--------+--------+------------------+--------+   +----------+--------+-------+----------------+-------------------+            PSV cm/sEDV cmsDescribe        Arm Pressure (mmHG)  +----------+--------+-------+----------------+-------------------+  Subclavian150            Multiphasic, WNL                     +----------+--------+-------+----------------+-------------------+   +---------+--------+--+--------+--+---------+  VertebralPSV cm/s88EDV cm/s24Antegrade  +---------+--------+--+--------+--+---------+       Left Carotid Findings:  +----------+--------+--------+--------+------------------+--------+  PSV cm/sEDV cm/sStenosisPlaque DescriptionComments  +----------+--------+--------+--------+------------------+--------+  CCA Prox  100     25                                          +----------+--------+--------+--------+------------------+--------+  CCA Mid   105     27                                          +----------+--------+--------+--------+------------------+--------+  CCA Distal119     30              heterogenous                +----------+--------+--------+--------+------------------+--------+  ICA Prox  109     22      1-39%   heterogenous                +----------+--------+--------+--------+------------------+--------+  ICA Mid   84      26                                          +----------+--------+--------+--------+------------------+--------+  ICA Distal109     34                                          +----------+--------+--------+--------+------------------+--------+  ECA       125      13                                          +----------+--------+--------+--------+------------------+--------+   +----------+--------+--------+----------------+-------------------+            PSV cm/sEDV cm/sDescribe        Arm Pressure (mmHG)  +----------+--------+--------+----------------+-------------------+  Subclavian                Multiphasic, WNL                     +----------+--------+--------+----------------+-------------------+   +---------+--------+--------+-----------------------------+  VertebralPSV cm/sEDV cm/sRetrograde and High resistant  +---------+--------+--------+-----------------------------+     Summary:  Right Carotid: Velocities in the right ICA are consistent with a 1-39%  stenosis.   Left Carotid: Patent endarterectomy site with velocities consistent with a  1-39%                stenosis.   Vertebrals:  Right vertebral artery demonstrates antegrade flow. Left  vertebral               artery demonstrates retrograde flow.  Subclavians: Normal flow hemodynamics were seen in bilateral subclavian               arteries.   ASSESSMENT:  80 y.o. female here for follow up asymptomatic carotid artery stenosis.  She is left CEA by Dr. Scot Dock on 10/23/2004.  She remains asymptomatic with B ICA stenosis< 39%.  PLAN:Continue activity as tolerates without restrictions.  She continues to have some clumsiness of the right hand due to her severe arthritis, but this is unchanged from last year.  She will f/u  in 1 year for repeat carotid duplex.    Roxy Horseman PA-C [facility] Office: [phone]  MD in clinic [place]

## 2021-01-15 ENCOUNTER — Other Ambulatory Visit: Payer: Self-pay

## 2021-01-15 ENCOUNTER — Other Ambulatory Visit (INDEPENDENT_AMBULATORY_CARE_PROVIDER_SITE_OTHER): Payer: Medicare Other

## 2021-01-15 DIAGNOSIS — E213 Hyperparathyroidism, unspecified: Secondary | ICD-10-CM | POA: Diagnosis not present

## 2021-01-15 DIAGNOSIS — E349 Endocrine disorder, unspecified: Secondary | ICD-10-CM | POA: Diagnosis not present

## 2021-01-15 DIAGNOSIS — M1A9XX1 Chronic gout, unspecified, with tophus (tophi): Secondary | ICD-10-CM

## 2021-01-15 LAB — CBC WITH DIFFERENTIAL/PLATELET
Basophils Absolute: 0 10*3/uL (ref 0.0–0.1)
Basophils Relative: 0.8 % (ref 0.0–3.0)
Eosinophils Absolute: 0.1 10*3/uL (ref 0.0–0.7)
Eosinophils Relative: 2.2 % (ref 0.0–5.0)
HCT: 33 % — ABNORMAL LOW (ref 36.0–46.0)
Hemoglobin: 11.1 g/dL — ABNORMAL LOW (ref 12.0–15.0)
Lymphocytes Relative: 26 % (ref 12.0–46.0)
Lymphs Abs: 1.4 10*3/uL (ref 0.7–4.0)
MCHC: 33.6 g/dL (ref 30.0–36.0)
MCV: 106.8 fl — ABNORMAL HIGH (ref 78.0–100.0)
Monocytes Absolute: 0.9 10*3/uL (ref 0.1–1.0)
Monocytes Relative: 15.7 % — ABNORMAL HIGH (ref 3.0–12.0)
Neutro Abs: 3 10*3/uL (ref 1.4–7.7)
Neutrophils Relative %: 55.3 % (ref 43.0–77.0)
Platelets: 366 10*3/uL (ref 150.0–400.0)
RBC: 3.09 Mil/uL — ABNORMAL LOW (ref 3.87–5.11)
RDW: 14.8 % (ref 11.5–15.5)
WBC: 5.4 10*3/uL (ref 4.0–10.5)

## 2021-01-15 LAB — BASIC METABOLIC PANEL
BUN: 31 mg/dL — ABNORMAL HIGH (ref 6–23)
CO2: 27 mEq/L (ref 19–32)
Calcium: 9.6 mg/dL (ref 8.4–10.5)
Chloride: 102 mEq/L (ref 96–112)
Creatinine, Ser: 1.08 mg/dL (ref 0.40–1.20)
GFR: 48.06 mL/min — ABNORMAL LOW (ref >60.00)
Glucose, Bld: 96 mg/dL (ref 70–99)
Potassium: 4.6 mEq/L (ref 3.5–5.1)
Sodium: 139 mEq/L (ref 135–145)

## 2021-01-15 LAB — VITAMIN D 25 HYDROXY (VIT D DEFICIENCY, FRACTURES): VITD: 58.04 ng/mL (ref 30.00–100.00)

## 2021-01-15 LAB — URIC ACID: Uric Acid, Serum: 4.7 mg/dL (ref 2.4–7.0)

## 2021-01-23 ENCOUNTER — Telehealth: Payer: Self-pay | Admitting: Internal Medicine

## 2021-01-23 ENCOUNTER — Encounter: Payer: Self-pay | Admitting: Internal Medicine

## 2021-01-23 ENCOUNTER — Ambulatory Visit (INDEPENDENT_AMBULATORY_CARE_PROVIDER_SITE_OTHER): Payer: Medicare Other | Admitting: Internal Medicine

## 2021-01-23 ENCOUNTER — Other Ambulatory Visit: Payer: Self-pay

## 2021-01-23 VITALS — BP 130/80 | HR 66 | Ht 67.0 in | Wt 120.0 lb

## 2021-01-23 DIAGNOSIS — I472 Ventricular tachycardia: Secondary | ICD-10-CM | POA: Diagnosis not present

## 2021-01-23 DIAGNOSIS — I6523 Occlusion and stenosis of bilateral carotid arteries: Secondary | ICD-10-CM

## 2021-01-23 DIAGNOSIS — Z79899 Other long term (current) drug therapy: Secondary | ICD-10-CM

## 2021-01-23 DIAGNOSIS — I10 Essential (primary) hypertension: Secondary | ICD-10-CM | POA: Diagnosis not present

## 2021-01-23 DIAGNOSIS — I5032 Chronic diastolic (congestive) heart failure: Secondary | ICD-10-CM

## 2021-01-23 DIAGNOSIS — J449 Chronic obstructive pulmonary disease, unspecified: Secondary | ICD-10-CM

## 2021-01-23 DIAGNOSIS — R000 Tachycardia, unspecified: Secondary | ICD-10-CM

## 2021-01-23 DIAGNOSIS — I4819 Other persistent atrial fibrillation: Secondary | ICD-10-CM

## 2021-01-23 NOTE — Progress Notes (Signed)
Patient ID: [name], female   DOB: [birth year], 80 y.o.   MRN: [id number]     Patient Care Team: [name], MD as PCP - General (Family Medicine) [name], MD as PCP - Cardiology (Cardiology) [name], MD (Family Medicine)   HPI   [name] is a 80 y.o. female seen in follow-up for recurrent atrial fibrillation, treated with  flecainide >> amiodarone and anticoagulation with dabigitran   Has had hospitalizations related to HFpEF with rapid AF.  Most recently 3/22 at which time the flecainide was replaced with amiodarone   ER following a fall while Inebriated ( ER BAL >200)   She has had recurrent atrial fib with RVR  Underwen tDCCV with rapid reversion  Started flecainide 5/19   Seen in Discussed with DR TT--drinking at least 2 ounces of vodka at night and some wine.  There have been concerns from the children about drinking too much.  Feels doted upon by her grandchildren and appreciated by her great-grandchildren  Underwent repeat cardioversion 12/21 on flecainide.  Unfortunately, she reverted to atrial fibrillation.  Flecainide dose was increased.  Cardioversion recommended but she had missed a dose of Pradaxa.  She has a history of recurrent falls.  She lives in fear of this   Today, the patient denies chest pain, nocturnal dyspnea, orthopnea or peripheral edema.  There have been no palpitations, lightheadedness or syncope.    Complains of Dyspnea assoc with exertion, < 150 yds.  She doesn't walk anywhere by herself due to a fear of falling and hurting herself . She has an Sport and exercise psychologist now in case of emergencies if she do hurt herself or fall      Thromboembolic risk factors ( age  -2, HTN-1, Vasc disease -1, Gender-1) for a CHADSVASc Score of 5         She have reduced her alcohol to intermittently a glass of wine before her cocktail watching the news  Patient denies symptoms of GI intolerance, sun sensitivity, neurological symptoms  attributable to amiodarone.     husband past away in 2019   DATE TEST EF   2/19 Echo   65 % LAE (48/2.8/46)  9/21 Echo  60-65%   3/22 Echo 50-55% LA normal     Date Cr K TSH LFT  Hgb  2/19 0.8 3.9   12.5  5/19  1.12 3.8     6/20 0.97 4.4   12.4  12/20 0.9  (CE) 2/21 4.2    10.6  9/21 1.11<<1.7 5.0   10.3<<8.5  6/22 1.08 4.6 2.13 (3/22) 35 (3/22) 11.1   Past Surgical History:  Procedure Laterality Date   ABDOMINAL HYSTERECTOMY     ANKLE FRACTURE SURGERY  4/08   left---hardware still in place   BREAST BIOPSY Left 2001   breast ca   BREAST EXCISIONAL BIOPSY Left yrs ago   benign   BREAST LUMPECTOMY Left 2001   f/u radiation   CARDIOVERSION N/A 10/04/2017   Procedure: CARDIOVERSION;  Surgeon: Wellington Hampshire, MD;  Location: [facility];  Service: Cardiovascular;  Laterality: N/A;   CARDIOVERSION N/A 12/16/2017   Procedure: CARDIOVERSION;  Surgeon: [name], MD;  Location: [facility];  Service: Cardiovascular;  Laterality: N/A;   CARDIOVERSION N/A 06/24/2020   Procedure: CARDIOVERSION;  Surgeon: Wellington Hampshire, MD;  Location: [facility];  Service: Cardiovascular;  Laterality: N/A;   CAROTID ENDARTERECTOMY Left 10/23/2004   FRACTURE SURGERY     OOPHORECTOMY  SHOULDER SURGERY  6/07   left   TONSILLECTOMY AND ADENOIDECTOMY     TOTAL HIP ARTHROPLASTY  2004   right    Current Outpatient Medications  Medication Sig Dispense Refill   acetaminophen (TYLENOL) 325 MG tablet Take 2 tablets (650 mg total) by mouth every 4 (four) hours as needed for headache or mild pain.     allopurinol (ZYLOPRIM) 100 MG tablet Take 1.5 tablets (150 mg total) by mouth daily. 135 tablet 1   amiodarone (PACERONE) 200 MG tablet Take 200 mg by mouth daily.     amLODipine (NORVASC) 5 MG tablet Take 1 tablet (5 mg total) by mouth daily. 90 tablet 2   amoxicillin (AMOXIL) 500 MG capsule Take 500 mg by mouth See admin instructions. Take 4 tablets 1 hour prior to dental appointment     cholecalciferol  (VITAMIN D) 25 MCG (1000 UNIT) tablet Take 2 tablets (2,000 Units total) by mouth daily.     flecainide (TAMBOCOR) 50 MG tablet Take 75 mg by mouth 2 (two) times daily.     furosemide (LASIX) 40 MG tablet Take 0.5 tablet (20 mg) by mouth every other day.     LORazepam (ATIVAN) 0.5 MG tablet TAKE 1 TABLET BY MOUTH TWICE A DAY AS NEEDED FOR ANXIETY 60 tablet 0   losartan (COZAAR) 50 MG tablet TAKE 1 TABLET BY MOUTH EVERY DAY 30 tablet 3   mirtazapine (REMERON) 15 MG tablet Take 1 tablet (15 mg total) by mouth at bedtime.     Multiple Vitamin (MULTIVITAMIN) capsule Take 1 capsule by mouth daily.     Multiple Vitamins-Minerals (PRESERVISION AREDS 2+MULTI VIT PO) Take 1 capsule by mouth in the morning and at bedtime.     PRADAXA 150 MG CAPS capsule TAKE 1 CAPSULE BY MOUTH TWICE A DAY 180 capsule 3   Chlorhexidine Gluconate Cloth 2 % PADS Apply 6 each topically daily. (Patient not taking: Reported on 01/23/2021)     No current facility-administered medications for this visit.    Allergies  Allergen Reactions   Adhesive [Tape] Other (See Comments)    "Does not work well with me. Can take my skin off. Careful"   Review of Systems negative except from HPI and PMH  Physical Exam: BP 130/80 (BP Location: Left Arm, Patient Position: Sitting, Cuff Size: Normal)   Pulse 66   Ht 5\' 7"  (1.702 m)   Wt 120 lb (54.4 kg)   SpO2 96%   BMI 18.79 kg/m  Well developed and well nourished in no acute distress HENT normal Neck supple with JVP-flat Lungs Clear Device pocket well healed; without hematoma or erythema.  There is no tethering  Regular rate and rhythm, No gallop No / murmur Abd-soft with active BS No Clubbing cyanosis No edema Skin-warm and dry A & Oriented  Grossly normal sensory and motor function  ECG: Sinus at 66 Interval 20/09/46  Assessment and  Plan  Atrial fibrillation-persistent with a rapid rate  Sinus brady  Alcohol use/ (As above)   Anemia   Renal insufficiency grade  3  Hypertension   Orthostatic hypotension  Peripheral vascular disease carotid artery occlusion  Atrial fibrillation currently controlled on amiodarone.  Seems to be tolerating.  We will check surveillance laboratories.  Continue 200 mg daily.  There is confusion from the [facility] which suggest that she is also taking flecainide.  She will look at her medications at home and let us know.  No interval bleeding.  Continue dabigatran at 150  mg  twice daily her GFR is about 35 (transition GFR is at 30)  Blood pressure is much better.  Continue her on amlodipine 5 and losartan 50.  Her falls continue to be a concern.  I have reached out to her primary care physician to consider rehab.  And continue to encourage her to decrease her alcohol intake.       I,Stephanie Williams,acting as a Education administrator for Virl Axe, MD.,have documented all relevant documentation on the behalf of Virl Axe, MD,as directed by  Virl Axe, MD while in the presence of Virl Axe, MD.  I, Virl Axe, MD, have reviewed all documentation for this visit. The documentation on 01/23/21 for the exam, diagnosis, procedures, and orders are all accurate and complete.

## 2021-01-23 NOTE — Telephone Encounter (Signed)
Patient states she is not taking flecainide and wanted Dr. Caryl Comes and his nurse to be aware

## 2021-01-23 NOTE — Telephone Encounter (Signed)
I called the patient and confirmed with her that she is NOT taking flecainide. She is aware I will remove this from her list and notify Dr. Caryl Comes as well.   The patient did not wish to go for her labs/ chest x-ray today due to company coming over. She advised she will have this done next week.

## 2021-01-23 NOTE — Patient Instructions (Addendum)
Medication Instructions:  - Your physician recommends that you continue on your current medications as directed. Please refer to the Current Medication list given to you today.  - Please call the office and let us know if you are currently taking flecainide   *If you need a refill on your cardiac medications before your next appointment, please call your pharmacy*   Lab Work: - Your physician recommends that you have lab work today: Actuary at [facility] 1st desk on the right to check in, past the screening table Lab hours: Monday- Friday (7:30 am- 5:30 pm)   If you have labs (blood work) drawn today and your tests are completely normal, you will receive your results only by: [place] (if you have MyChart) OR A paper copy in the mail If you have any lab test that is abnormal or we need to change your treatment, we will call you to review the results.   Testing/Procedures: - A chest x-ray takes a picture of the organs and structures inside the chest, including the heart, lungs, and blood vessels. This test can show several things, including, whether the heart is enlarges; whether fluid is building up in the lungs; and whether pacemaker / defibrillator leads are still in place.  Medical Mall Entrance at [facility] 1st desk on the right to check in, past the screening table    Follow-Up: At [facility], you and your health needs are our priority.  As part of our continuing mission to provide you with exceptional heart care, we have created designated Provider Care Teams.  These Care Teams include your primary Cardiologist (physician) and Advanced Practice Providers (APPs -  Physician Assistants and Nurse Practitioners) who all work together to provide you with the care you need, when you need it.  We recommend signing up for the patient portal called "MyChart".  Sign up information is provided on this After Visit Summary.  MyChart is used to connect with  patients for Virtual Visits (Telemedicine).  Patients are able to view lab/test results, encounter notes, upcoming appointments, etc.  Non-urgent messages can be sent to your provider as well.   To learn more about what you can do with MyChart, go to [email or web address].    Your next appointment:   6 month(s)  The format for your next appointment:   In Person  Provider:   Virl Axe, MD   Other Instructions N/a

## 2021-01-24 ENCOUNTER — Telehealth: Payer: Self-pay | Admitting: Family Medicine

## 2021-01-24 NOTE — Telephone Encounter (Signed)
Please let the patient know I heard from Dr. Caryl Comes.  He mentioned the possibility of doing physical therapy for her balance issues.  She did this previously though does she want to go back and see physical therapy to help with her balance?  It also looks like he ordered a chest x-ray for her shortness of breath.  She should have this done.

## 2021-01-26 DIAGNOSIS — Z20822 Contact with and (suspected) exposure to covid-19: Secondary | ICD-10-CM | POA: Diagnosis not present

## 2021-01-30 ENCOUNTER — Ambulatory Visit
Admission: RE | Admit: 2021-01-30 | Discharge: 2021-01-30 | Disposition: A | Discharge status: Home / Self Care | Payer: Medicare Other | Attending: Internal Medicine | Admitting: Internal Medicine

## 2021-01-30 ENCOUNTER — Ambulatory Visit
Admission: RE | Admit: 2021-01-30 | Discharge: 2021-01-30 | Disposition: A | Discharge status: Home / Self Care | Payer: Medicare Other | Source: Ambulatory Visit | Attending: Internal Medicine | Admitting: Internal Medicine

## 2021-01-30 ENCOUNTER — Other Ambulatory Visit
Admission: RE | Admit: 2021-01-30 | Discharge: 2021-01-30 | Disposition: A | Discharge status: Home / Self Care | Payer: Medicare Other | Source: Home / Self Care | Attending: Internal Medicine | Admitting: Internal Medicine

## 2021-01-30 DIAGNOSIS — Z79899 Other long term (current) drug therapy: Secondary | ICD-10-CM | POA: Insufficient documentation

## 2021-01-30 DIAGNOSIS — I4819 Other persistent atrial fibrillation: Secondary | ICD-10-CM | POA: Insufficient documentation

## 2021-01-30 DIAGNOSIS — I517 Cardiomegaly: Secondary | ICD-10-CM | POA: Diagnosis not present

## 2021-01-30 DIAGNOSIS — J449 Chronic obstructive pulmonary disease, unspecified: Secondary | ICD-10-CM

## 2021-01-30 DIAGNOSIS — J439 Emphysema, unspecified: Secondary | ICD-10-CM | POA: Diagnosis not present

## 2021-01-30 LAB — HEPATIC FUNCTION PANEL
ALT: 19 U/L (ref 0–44)
AST: 26 U/L (ref 15–41)
Albumin: 4.1 g/dL (ref 3.5–5.0)
Alkaline Phosphatase: 99 U/L (ref 38–126)
Bilirubin, Direct: <0.1 mg/dL (ref 0.0–0.2)
Total Bilirubin: 0.8 mg/dL (ref 0.3–1.2)
Total Protein: 7.7 g/dL (ref 6.5–8.1)

## 2021-01-30 LAB — TSH: TSH: 2.653 u[IU]/mL (ref 0.350–4.500)

## 2021-02-06 ENCOUNTER — Telehealth: Payer: Self-pay

## 2021-02-06 NOTE — Telephone Encounter (Signed)
LMTCB

## 2021-02-06 NOTE — Telephone Encounter (Signed)
-----   Message from [name], MD sent at 01/31/2021 12:58 PM EDT -----  Please inform patient that drug surveillance labs are normal

## 2021-02-06 NOTE — Telephone Encounter (Signed)
Spoke with pt and advised per Dr Klein labs are normal.  Pt verbalizes understanding and thanked RN for the call. 

## 2021-02-10 ENCOUNTER — Ambulatory Visit (INDEPENDENT_AMBULATORY_CARE_PROVIDER_SITE_OTHER): Payer: Medicare Other

## 2021-02-10 VITALS — Ht 67.0 in | Wt 117.8 lb

## 2021-02-10 DIAGNOSIS — Z0000 Encounter for general adult medical examination without abnormal findings: Secondary | ICD-10-CM

## 2021-02-10 NOTE — Progress Notes (Signed)
Subjective:   [name] is a 80 y.o. female who presents for Medicare Annual (Subsequent) preventive examination.  Review of Systems    No ROS.  Medicare Wellness Virtual Visit.  Visual/audio telehealth visit, UTA vital signs.   See social history for additional risk factors.       Objective:    Today's Vitals   02/10/21 0829  Weight: 117 lb 12.8 oz (53.4 kg)  Height: '5\' 7"'$  (1.702 m)   Body mass index is 18.45 kg/m.  Advanced Directives 02/10/2021 09/21/2020 09/18/2020 04/25/2020 02/08/2020 10/10/2019 09/18/2019  Does Patient Have a Medical Advance Directive? Yes Yes No;Yes Yes Yes Yes Yes  Type of Paramedic of [place];Living will [place];Living will - [place];Living will [place];Living will Living will;Healthcare Power of Attorney -  Does patient want to make changes to medical advance directive? No - Patient declined No - Patient declined - No - Patient declined No - Patient declined - -  Copy of [place] in Chart? No - copy requested - - - No - copy requested No - copy requested -  Would patient like information on creating a medical advance directive? - - - - - - -    Current Medications (verified) Outpatient Encounter Medications as of 02/10/2021  Medication Sig   acetaminophen (TYLENOL) 325 MG tablet Take 2 tablets (650 mg total) by mouth every 4 (four) hours as needed for headache or mild pain.   allopurinol (ZYLOPRIM) 100 MG tablet Take 1.5 tablets (150 mg total) by mouth daily.   amiodarone (PACERONE) 200 MG tablet Take 200 mg by mouth daily.   amLODipine (NORVASC) 5 MG tablet Take 1 tablet (5 mg total) by mouth daily.   amoxicillin (AMOXIL) 500 MG capsule Take 500 mg by mouth See admin instructions. Take 4 tablets 1 hour prior to dental appointment   Chlorhexidine Gluconate Cloth 2 % PADS Apply 6 each topically daily. (Patient not taking: Reported on 01/23/2021)    cholecalciferol (VITAMIN D) 25 MCG (1000 UNIT) tablet Take 2 tablets (2,000 Units total) by mouth daily.   furosemide (LASIX) 40 MG tablet Take 0.5 tablet (20 mg) by mouth every other day.   LORazepam (ATIVAN) 0.5 MG tablet TAKE 1 TABLET BY MOUTH TWICE A DAY AS NEEDED FOR ANXIETY   losartan (COZAAR) 50 MG tablet TAKE 1 TABLET BY MOUTH EVERY DAY   mirtazapine (REMERON) 15 MG tablet Take 1 tablet (15 mg total) by mouth at bedtime.   Multiple Vitamin (MULTIVITAMIN) capsule Take 1 capsule by mouth daily.   Multiple Vitamins-Minerals (PRESERVISION AREDS 2+MULTI VIT PO) Take 1 capsule by mouth in the morning and at bedtime.   PRADAXA 150 MG CAPS capsule TAKE 1 CAPSULE BY MOUTH TWICE A DAY   No facility-administered encounter medications on file as of 02/10/2021.    Allergies (verified) Adhesive [tape]   History: Past Medical History:  Diagnosis Date   (HFpEF) heart failure with preserved ejection fraction ([place])    a. 08/2017 Echo: EF 55-60%, no rwma, mild MR, mildly dil LA, nl RV fxn; b. 03/2020 Echo: EF 60-65%, no rwma, Gr2 DD. RVSP 44.72mHg. Mod dil LA. Mild MR; c. 09/2020 Echo: EF 50-55%, no rwma, mild LVH, mod red RV fxn, mildly dily RA.   Acute CHF (congestive heart failure) [place] 09/18/2020   Breast cancer ([place]   left breast   Cancer ([facility]   left breast ca   Carotid  arterial disease ([place])    a. 10/2004 s/p L CEA; b. 12/2015 Carotid U/S: RICA 1-39%; b. LICA patent CEA site.   Closed right hip fracture ([place]) 09/20/2019   GERD (gastroesophageal reflux disease)    Hyperlipidemia    Hypertension    Osteoarthritis, multiple sites    Osteopenia    Persistent atrial fibrillation ([place])    a. Dx 08/2017; b. CHA2DS2VASc = 6-->Pradaxa; c. 09/2017 Successful DCCV (second shock - 200J); d. 10/2017 Recurrent Afib-->flecainide started 11/2017; e. 06/2020 s/p DCCV (200J x 1); f. 06/2020 Recurrent AF->Flec inc 75bid; g. 09/2020 WCT->flec d/c'd->amio started.   Personal history of radiation therapy  2001   left breast ca   Psoriasis    Wide-complex tachycardia ([place])    a. 09/2020 in setting of presumed Flecainide toxicity.  Flecainide d/c'd.   Past Surgical History:  Procedure Laterality Date   ABDOMINAL HYSTERECTOMY     ANKLE FRACTURE SURGERY  4/08   left---hardware still in place   BREAST BIOPSY Left 2001   breast ca   BREAST EXCISIONAL BIOPSY Left yrs ago   benign   BREAST LUMPECTOMY Left 2001   f/u radiation   CARDIOVERSION N/A 10/04/2017   Procedure: CARDIOVERSION;  Surgeon: Wellington Hampshire, MD;  Location: [facility];  Service: Cardiovascular;  Laterality: N/A;   CARDIOVERSION N/A 12/16/2017   Procedure: CARDIOVERSION;  Surgeon: [name], MD;  Location: [facility];  Service: Cardiovascular;  Laterality: N/A;   CARDIOVERSION N/A 06/24/2020   Procedure: CARDIOVERSION;  Surgeon: Wellington Hampshire, MD;  Location: [facility];  Service: Cardiovascular;  Laterality: N/A;   CAROTID ENDARTERECTOMY Left 10/23/2004   FRACTURE SURGERY     OOPHORECTOMY     SHOULDER SURGERY  6/07   left   TONSILLECTOMY AND ADENOIDECTOMY     TOTAL HIP ARTHROPLASTY  2004   right   Family History  Problem Relation Age of Onset   Hypertension Brother    Pneumonia Mother    Leukemia Father    Heart disease Neg Hx    Diabetes Neg Hx    Breast cancer Neg Hx    Social History   Socioeconomic History   Marital status: Widowed    Spouse name: Not on file   Number of children: Not on file   Years of education: Not on file   Highest education level: Not on file  Occupational History   Occupation: Marketing    Comment: Retired  Tobacco Use   Smoking status: Former    Packs/day: 1.00    Years: 40.00    Pack years: 40.00    Types: Cigarettes    Quit date: 07/21/1995    Years since quitting: 25.5   Smokeless tobacco: Never  Vaping Use   Vaping Use: Never used  Substance and Sexual Activity   Alcohol use: Yes    Alcohol/week: 2.0 standard drinks    Types: 1 Glasses of wine, 1 Standard drinks  or equivalent per week    Comment: 1 glass of wine and 1 drink of liquor daily   Drug use: No   Sexual activity: Never  Other Topics Concern   Not on file  Social History Narrative   1 natural child   3 adopted children   Artist---still teaches water colors   Husband has Alzheimers      Has living will   Daughter Amy is health care POA   DNR    No tube feeds if cognitively unaware   Social Determinants of Health  Financial Resource Strain: Low Risk    Difficulty of Paying Living Expenses: Not hard at all  Food Insecurity: No Food Insecurity   Worried About Charity fundraiser in the Last Year: Never true   Ran Out of Food in the Last Year: Never true  Transportation Needs: No Transportation Needs   Lack of Transportation (Medical): No   Lack of Transportation (Non-Medical): No  Physical Activity: Unknown   Days of Exercise per Week: 0 days   Minutes of Exercise per Session: Not on file  Stress: No Stress Concern Present   Feeling of Stress : Only a little  Social Connections: Unknown   Frequency of Communication with Friends and Family: More than three times a week   Frequency of Social Gatherings with Friends and Family: More than three times a week   Attends Religious Services: Not on Electrical engineer or Organizations: Not on file   Attends Archivist Meetings: Not on file   Marital Status: Not on file    Tobacco Counseling Counseling given: Not Answered   Clinical Intake:  Pre-visit preparation completed: Yes        Diabetes: No  How often do you need to have someone help you when you read instructions, pamphlets, or other written materials from your doctor or pharmacy?: 1 - Never   Interpreter Needed?: No      Activities of Daily Living In your present state of health, do you have any difficulty performing the following activities: 02/10/2021 09/21/2020  Hearing? N N  Vision? N N  Comment - reading glasses  Difficulty  concentrating or making decisions? N N  Walking or climbing stairs? Y N  Dressing or bathing? N N  Doing errands, shopping? N N  Preparing Food and eating ? N -  Using the Toilet? N -  In the past six months, have you accidently leaked urine? N -  Do you have problems with loss of bowel control? N -  Managing your Medications? N -  Managing your Finances? N -  Housekeeping or managing your Housekeeping? N -  Some recent data might be hidden    Patient Care Team: [name], MD as PCP - General (Family Medicine) [name], MD as PCP - Cardiology (Cardiology) [name], MD (Family Medicine)  Indicate any recent Medical Services you may have received from other than [facility] providers in the past year (date may be approximate).     Assessment:   This is a routine wellness examination for [name].  I connected with Miarose today by telephone and verified that I am speaking with the correct person using two identifiers. Location patient: home Location provider: work Persons participating in the virtual visit: patient, Marine scientist.    I discussed the limitations, risks, security and privacy concerns of performing an evaluation and management service by telephone and the availability of in person appointments. The patient expressed understanding and verbally consented to this telephonic visit.    Interactive audio and video telecommunications were attempted between this provider and patient, however failed, due to patient having technical difficulties OR patient did not have access to video capability.  We continued and completed visit with audio only.  Some vital signs may be absent or patient reported.   Hearing/Vision screen Hearing Screening - Comments:: Patient is able to hear conversational tones without difficulty.  No issues reported. Vision Screening - Comments:: They have seen their ophthalmologist in the last 12 months.  Dietary issues and exercise  activities discussed: Healthy diet Good water intake Current Exercise Habits: Home exercise routine, Type of exercise: stretching;calisthenics, Time (Minutes): 45, Frequency (Times/Week): 3, Weekly Exercise (Minutes/Week): 135, Intensity: Mild   Goals Addressed               This Visit's Progress     Patient Stated     Maintain healthy lifestyle (pt-stated)        Stay active Healthy diet       Depression Screen PHQ 2/9 Scores 02/10/2021 09/05/2020 04/05/2020 02/08/2020 01/23/2020 05/17/2019 02/07/2019  PHQ - 2 Score 0 0 0 0 0 0 0  PHQ- 9 Score - - 0 - - - -    Fall Risk Fall Risk  02/10/2021 09/05/2020 05/27/2020 02/08/2020 09/20/2019  Falls in the past year? 1 0 0 0 1  Number falls in past yr: - 0 0 - 0  Injury with Fall? 0 - - - 1  Risk for fall due to : - - - Impaired balance/gait -  Follow up - Falls evaluation completed - Falls evaluation completed Falls evaluation completed    FALL RISK PREVENTION PERTAINING TO THE HOME: Handrails in use when climbing stairs? Yes Home free of loose throw rugs in walkways, pet beds, electrical cords, etc? Yes  Adequate lighting in your home to reduce risk of falls? Yes   ASSISTIVE DEVICES UTILIZED TO PREVENT FALLS: Use of a cane, walker or w/c? No   TIMED UP AND GO: Was the test performed? No .   Cognitive Function: Patient is alert and oriented x3.  MMSE - Mini Mental State Exam 09/12/2015  Orientation to time 5  Orientation to Place 5  Registration 3  Attention/ Calculation 5  Recall 3  Language- name 2 objects 2  Language- repeat 1  Language- follow 3 step command 3  Language- read & follow direction 1  Write a sentence 1  Copy design 1  Total score 30     6CIT Screen 02/10/2021 02/08/2020 02/07/2019 09/11/2016  What Year? 0 points 0 points 0 points 0 points  What month? 0 points 0 points 0 points -  What time? 0 points 0 points 0 points 0 points  Count back from 20 0 points - 0 points 0 points  Months in reverse 0 points -  0 points 0 points  Repeat phrase 0 points - 0 points 0 points  Total Score 0 - 0 -    Immunizations Immunization History  Administered Date(s) Administered   Fluad Quad(high Dose 65+) 04/15/2020   Influenza Split 05/20/2011, 05/10/2013   Influenza, High Dose Seasonal PF 05/10/2015, 04/07/2019   Influenza-Unspecified 05/11/2014, 04/19/2016, 04/24/2017   PFIZER(Purple Top)SARS-COV-2 Vaccination 07/31/2019, 08/21/2019, 12/17/2020   Pneumococcal Conjugate-13 12/13/2013   Pneumococcal Polysaccharide-23 06/19/2009   Tdap 03/08/2011   Health Maintenance Health Maintenance  Topic Date Due   INFLUENZA VACCINE  02/17/2021   TETANUS/TDAP  03/07/2021   COVID-19 Vaccine (4 - Booster for [place] series) 03/19/2021   DEXA SCAN  Completed   PNA vac Low Risk Adult  Completed   HPV VACCINES  Aged Out   MAMMOGRAM  Discontinued   Zoster Vaccines- Shingrix  Discontinued   Mammogram- discontinued per patient  Shingles vaccine- discontinued per patient.   DG Chest 2 view: completed 02/02/21   Hepatitis C Screening: does not qualify  Vision Screening: Recommended annual ophthalmology exams for early detection of glaucoma and other disorders of the eye. Is the patient up to date with  their annual eye exam?  Yes   Dental Screening: Recommended annual dental exams for proper oral hygiene  Community Resource Referral / Chronic Care Management: CRR required this visit?  No   CCM required this visit?  No      Plan:   Keep all routine maintenance appointments.   I have personally reviewed and noted the following in the patient's chart:   Medical and social history Use of alcohol, tobacco or illicit drugs  Current medications and supplements including opioid prescriptions. Patient is not currently taking opioid.  Functional ability and status Nutritional status Physical activity Advanced directives List of other physicians Hospitalizations, surgeries, and ER visits in previous 12  months Vitals Screenings to include cognitive, depression, and falls Referrals and appointments  In addition, I have reviewed and discussed with patient certain preventive protocols, quality metrics, and best practice recommendations. A written personalized care plan for preventive services as well as general preventive health recommendations were provided to patient via mail.     Varney Biles, LPN   [id number]

## 2021-02-10 NOTE — Patient Instructions (Addendum)
[name] , Thank you for taking time to come for your Medicare Wellness Visit. I appreciate your ongoing commitment to your health goals. Please review the following plan we discussed and let me know if I can assist you in the future.   These are the goals we discussed:  Goals       Patient Stated     Maintain healthy lifestyle (pt-stated)      Stay active Healthy diet        This is a list of the screening recommended for you and due dates:  Health Maintenance  Topic Date Due   Flu Shot  02/17/2021   Tetanus Vaccine  03/07/2021   COVID-19 Vaccine (4 - Booster for Pfizer series) 03/19/2021   DEXA scan (bone density measurement)  Completed   Pneumonia vaccines  Completed   HPV Vaccine  Aged Out   Mammogram  Discontinued   Zoster (Shingles) Vaccine  Discontinued    Advanced directives: End of life planning; Advance aging; Advanced directives discussed.  Copy of current HCPOA/Living Will requested.    Conditions/risks identified: none new  Follow up in one year for your annual wellness visit    Preventive Care 65 Years and Older, Female Preventive care refers to lifestyle choices and visits with your health care provider that can promote health and wellness. What does preventive care include? A yearly physical exam. This is also called an annual well check. Dental exams once or twice a year. Routine eye exams. Ask your health care provider how often you should have your eyes checked. Personal lifestyle choices, including: Daily care of your teeth and gums. Regular physical activity. Eating a healthy diet. Avoiding tobacco and drug use. Limiting alcohol use. Practicing safe sex. Taking low-dose aspirin every day. Taking vitamin and mineral supplements as recommended by your health care provider. What happens during an annual well check? The services and screenings done by your health care provider during your annual well check will depend on your age, overall health,  lifestyle risk factors, and family history of disease. Counseling  Your health care provider may ask you questions about your: Alcohol use. Tobacco use. Drug use. Emotional well-being. Home and relationship well-being. Sexual activity. Eating habits. History of falls. Memory and ability to understand (cognition). Work and work Statistician. Reproductive health. Screening  You may have the following tests or measurements: Height, weight, and BMI. Blood pressure. Lipid and cholesterol levels. These may be checked every 5 years, or more frequently if you are over 62 years old. Skin check. Lung cancer screening. You may have this screening every year starting at age 88 if you have a 30-pack-year history of smoking and currently smoke or have quit within the past 15 years. Fecal occult blood test (FOBT) of the stool. You may have this test every year starting at age 41. Flexible sigmoidoscopy or colonoscopy. You may have a sigmoidoscopy every 5 years or a colonoscopy every 10 years starting at age 36. Hepatitis C blood test. Hepatitis B blood test. Sexually transmitted disease (STD) testing. Diabetes screening. This is done by checking your blood sugar (glucose) after you have not eaten for a while (fasting). You may have this done every 1-3 years. Bone density scan. This is done to screen for osteoporosis. You may have this done starting at age 31. Mammogram. This may be done every 1-2 years. Talk to your health care provider about how often you should have regular mammograms. Talk with your health care provider about your  test results, treatment options, and if necessary, the need for more tests. Vaccines  Your health care provider may recommend certain vaccines, such as: Influenza vaccine. This is recommended every year. Tetanus, diphtheria, and acellular pertussis (Tdap, Td) vaccine. You may need a Td booster every 10 years. Zoster vaccine. You may need this after age  25. Pneumococcal 13-valent conjugate (PCV13) vaccine. One dose is recommended after age 66. Pneumococcal polysaccharide (PPSV23) vaccine. One dose is recommended after age 19. Talk to your health care provider about which screenings and vaccines you need and how often you need them. This information is not intended to replace advice given to you by your health care provider. Make sure you discuss any questions you have with your health care provider. Document Released: 08/02/2015 Document Revised: 03/25/2016 Document Reviewed: 05/07/2015 Elsevier Interactive Patient Education  2017 [place] Prevention in the Home Falls can cause injuries. They can happen to people of all ages. There are many things you can do to make your home safe and to help prevent falls. What can I do on the outside of my home? Regularly fix the edges of walkways and driveways and fix any cracks. Remove anything that might make you trip as you walk through a door, such as a raised step or threshold. Trim any bushes or trees on the path to your home. Use bright outdoor lighting. Clear any walking paths of anything that might make someone trip, such as rocks or tools. Regularly check to see if handrails are loose or broken. Make sure that both sides of any steps have handrails. Any raised decks and porches should have guardrails on the edges. Have any leaves, snow, or ice cleared regularly. Use sand or salt on walking paths during winter. Clean up any spills in your garage right away. This includes oil or grease spills. What can I do in the bathroom? Use night lights. Install grab bars by the toilet and in the tub and shower. Do not use towel bars as grab bars. Use non-skid mats or decals in the tub or shower. If you need to sit down in the shower, use a plastic, non-slip stool. Keep the floor dry. Clean up any water that spills on the floor as soon as it happens. Remove soap buildup in the tub or shower  regularly. Attach bath mats securely with double-sided non-slip rug tape. Do not have throw rugs and other things on the floor that can make you trip. What can I do in the bedroom? Use night lights. Make sure that you have a light by your bed that is easy to reach. Do not use any sheets or blankets that are too big for your bed. They should not hang down onto the floor. Have a firm chair that has side arms. You can use this for support while you get dressed. Do not have throw rugs and other things on the floor that can make you trip. What can I do in the kitchen? Clean up any spills right away. Avoid walking on wet floors. Keep items that you use a lot in easy-to-reach places. If you need to reach something above you, use a strong step stool that has a grab bar. Keep electrical cords out of the way. Do not use floor polish or wax that makes floors slippery. If you must use wax, use non-skid floor wax. Do not have throw rugs and other things on the floor that can make you trip. What can I do with my  stairs? Do not leave any items on the stairs. Make sure that there are handrails on both sides of the stairs and use them. Fix handrails that are broken or loose. Make sure that handrails are as long as the stairways. Check any carpeting to make sure that it is firmly attached to the stairs. Fix any carpet that is loose or worn. Avoid having throw rugs at the top or bottom of the stairs. If you do have throw rugs, attach them to the floor with carpet tape. Make sure that you have a light switch at the top of the stairs and the bottom of the stairs. If you do not have them, ask someone to add them for you. What else can I do to help prevent falls? Wear shoes that: Do not have high heels. Have rubber bottoms. Are comfortable and fit you well. Are closed at the toe. Do not wear sandals. If you use a stepladder: Make sure that it is fully opened. Do not climb a closed stepladder. Make sure that  both sides of the stepladder are locked into place. Ask someone to hold it for you, if possible. Clearly mark and make sure that you can see: Any grab bars or handrails. First and last steps. Where the edge of each step is. Use tools that help you move around (mobility aids) if they are needed. These include: Canes. Walkers. Scooters. Crutches. Turn on the lights when you go into a dark area. Replace any light bulbs as soon as they burn out. Set up your furniture so you have a clear path. Avoid moving your furniture around. If any of your floors are uneven, fix them. If there are any pets around you, be aware of where they are. Review your medicines with your doctor. Some medicines can make you feel dizzy. This can increase your chance of falling. Ask your doctor what other things that you can do to help prevent falls. This information is not intended to replace advice given to you by your health care provider. Make sure you discuss any questions you have with your health care provider. Document Released: 05/02/2009 Document Revised: 12/12/2015 Document Reviewed: 08/10/2014 Elsevier Interactive Patient Education  2017 [employer].

## 2021-02-10 NOTE — Progress Notes (Signed)
[place]  [phone]) [phone] [phone]) [phone]  ID: [name] OB: 06/16/1941  MR#: [id number]  NMM#:[id number]  Patient Care Team: [name], MD as PCP - General (Family Medicine) [name], MD as PCP - Cardiology (Cardiology) [name], MD (Family Medicine)  CHIEF COMPLAINT: MGUS, macrocytic anemia.  INTERVAL HISTORY: Patient returns to clinic today after being referred back by her primary care physician for further evaluation.  She continues to feel well and remains asymptomatic.  She has no neurologic complaints.  She denies any recent fevers or illnesses.  She has a good appetite and denies weight loss.  She has no chest pain, shortness of breath, cough, or hemoptysis.  She denies any nausea, vomiting, constipation, or diarrhea.  She has no urinary complaints.  Patient feels that her baseline offers no specific complaints today.  REVIEW OF SYSTEMS:   Review of Systems  Constitutional: Negative.  Negative for fever, malaise/fatigue and weight loss.  Respiratory: Negative.  Negative for cough, hemoptysis and shortness of breath.   Cardiovascular: Negative.  Negative for chest pain and leg swelling.  Gastrointestinal: Negative.  Negative for abdominal pain, blood in stool and melena.  Genitourinary: Negative.  Negative for dysuria and hematuria.  Musculoskeletal: Negative.   Skin: Negative.  Negative for rash.  Neurological: Negative.  Negative for dizziness, focal weakness, weakness and headaches.  Psychiatric/Behavioral: Negative.  The patient is not nervous/anxious.    As per HPI. Otherwise, a complete review of systems is negative.  PAST MEDICAL HISTORY: Past Medical History:  Diagnosis Date   (HFpEF) heart failure with preserved ejection fraction ([place])    a. 08/2017 Echo: EF 55-60%, no rwma, mild MR, mildly dil LA, nl RV fxn; b. 03/2020 Echo: EF 60-65%, no rwma, Gr2 DD. RVSP 44.25mHg. Mod dil LA. Mild MR; c. 09/2020  Echo: EF 50-55%, no rwma, mild LVH, mod red RV fxn, mildly dily RA.   Acute CHF (congestive heart failure) ([facility] 09/18/2020   Breast [place] ([facility] [place]   left breast   Cancer ([facility]   left breast ca   Carotid arterial disease ([facility]    a. 10/2004 s/p L CEA; b. 12/2015 Carotid U/S: RICA 1-39%; b. LICA patent CEA site.   Closed right hip fracture ([facility] 09/20/2019   GERD (gastroesophageal reflux disease)    Hyperlipidemia    Hypertension    Osteoarthritis, multiple sites    Osteopenia    Persistent atrial fibrillation ([facility]    a. Dx 08/2017; b. CHA2DS2VASc = 6-->Pradaxa; c. 09/2017 Successful DCCV (second shock - 200J); d. 10/2017 Recurrent Afib-->flecainide started 11/2017; e. 06/2020 s/p DCCV (200J x 1); f. 06/2020 Recurrent AF->Flec inc 75bid; g. 09/2020 WCT->flec d/c'd->amio started.   Personal history of radiation therapy 2001   left breast ca   Psoriasis    Wide-complex tachycardia ([facility]    a. 09/2020 in setting of presumed Flecainide toxicity.  Flecainide d/c'd.    PAST SURGICAL HISTORY: Past Surgical History:  Procedure Laterality Date   ABDOMINAL HYSTERECTOMY     ANKLE FRACTURE SURGERY  4/08   left---hardware still in place   BREAST BIOPSY Left 2001   breast ca   BREAST EXCISIONAL BIOPSY Left yrs ago   benign   BREAST LUMPECTOMY Left 2001   f/u radiation   CARDIOVERSION N/A 10/04/2017   Procedure: CARDIOVERSION;  Surgeon: [name] MD;  Location: [facility];  Service: Cardiovascular;  Laterality: N/A;   CARDIOVERSION N/A 12/16/2017   Procedure: CARDIOVERSION;  Surgeon:  [name], MD;  Location: [facility];  Service: Cardiovascular;  Laterality: N/A;   CARDIOVERSION N/A 06/24/2020   Procedure: CARDIOVERSION;  Surgeon: Wellington Hampshire, MD;  Location: [facility];  Service: Cardiovascular;  Laterality: N/A;   CAROTID ENDARTERECTOMY Left 10/23/2004   FRACTURE SURGERY     OOPHORECTOMY     SHOULDER SURGERY  6/07   left   TONSILLECTOMY AND ADENOIDECTOMY     TOTAL HIP  ARTHROPLASTY  2004   right    FAMILY HISTORY: Family History  Problem Relation Age of Onset   Hypertension Brother    Pneumonia Mother    Leukemia Father    Heart disease Neg Hx    Diabetes Neg Hx    Breast cancer Neg Hx     ADVANCED DIRECTIVES (Y/N):  N  HEALTH MAINTENANCE: Social History   Tobacco Use   Smoking status: Former    Packs/day: 1.00    Years: 40.00    Pack years: 40.00    Types: Cigarettes    Quit date: 07/21/1995    Years since quitting: 25.5   Smokeless tobacco: Never  Vaping Use   Vaping Use: Never used  Substance Use Topics   Alcohol use: Yes    Alcohol/week: 2.0 standard drinks    Types: 1 Glasses of wine, 1 Standard drinks or equivalent per week    Comment: 1 glass of wine and 1 drink of liquor daily   Drug use: No     Colonoscopy:  PAP:  Bone density:  Lipid panel:  Allergies  Allergen Reactions   Adhesive [Tape] Other (See Comments)    "Does not work well with me. Can take my skin off. Careful"    Current Outpatient Medications  Medication Sig Dispense Refill   acetaminophen (TYLENOL) 325 MG tablet Take 2 tablets (650 mg total) by mouth every 4 (four) hours as needed for headache or mild pain.     allopurinol (ZYLOPRIM) 100 MG tablet Take 1.5 tablets (150 mg total) by mouth daily. 135 tablet 1   amiodarone (PACERONE) 200 MG tablet Take 200 mg by mouth daily.     amLODipine (NORVASC) 5 MG tablet Take 1 tablet (5 mg total) by mouth daily. 90 tablet 2   amoxicillin (AMOXIL) 500 MG capsule Take 500 mg by mouth See admin instructions. Take 4 tablets 1 hour prior to dental appointment     cholecalciferol (VITAMIN D) 25 MCG (1000 UNIT) tablet Take 2 tablets (2,000 Units total) by mouth daily.     furosemide (LASIX) 40 MG tablet Take 0.5 tablet (20 mg) by mouth every other day.     LORazepam (ATIVAN) 0.5 MG tablet TAKE 1 TABLET BY MOUTH TWICE A DAY AS NEEDED FOR ANXIETY 60 tablet 0   losartan (COZAAR) 50 MG tablet TAKE 1 TABLET BY MOUTH EVERY  DAY 30 tablet 3   mirtazapine (REMERON) 15 MG tablet Take 1 tablet (15 mg total) by mouth at bedtime.     Multiple Vitamin (MULTIVITAMIN) capsule Take 1 capsule by mouth daily.     Multiple Vitamins-Minerals (PRESERVISION AREDS 2+MULTI VIT PO) Take 1 capsule by mouth in the morning and at bedtime.     PRADAXA 150 MG CAPS capsule TAKE 1 CAPSULE BY MOUTH TWICE A DAY 180 capsule 3   Chlorhexidine Gluconate Cloth 2 % PADS Apply 6 each topically daily. (Patient not taking: No sig reported)     No current facility-administered medications for this visit.    OBJECTIVE: Vitals:   02/11/21  1014  BP: (!) 147/66  Pulse: 78  Resp: 18  Temp: (!) 97.2 F (36.2 C)  SpO2: 100%     Body mass index is 18.79 kg/m.    ECOG FS:0 - Asymptomatic  General: Well-developed, well-nourished, no acute distress. Eyes: Pink conjunctiva, anicteric sclera. HEENT: Normocephalic, moist mucous membranes. Lungs: No audible wheezing or coughing. Heart: Regular rate and rhythm. Abdomen: Soft, nontender, no obvious distention. Musculoskeletal: No edema, cyanosis, or clubbing. Neuro: Alert, answering all questions appropriately. Cranial nerves grossly intact. Skin: No rashes or petechiae noted. Psych: Normal affect.  LAB RESULTS:  Lab Results  Component Value Date   NA 139 01/15/2021   K 4.6 01/15/2021   CL 102 01/15/2021   CO2 27 01/15/2021   GLUCOSE 96 01/15/2021   BUN 31 (H) 01/15/2021   CREATININE 1.08 01/15/2021   CALCIUM 9.6 01/15/2021   PROT 7.7 01/30/2021   ALBUMIN 4.1 01/30/2021   AST 26 01/30/2021   ALT 19 01/30/2021   ALKPHOS 99 01/30/2021   BILITOT 0.8 01/30/2021   GFRNONAA 42 (L) 10/14/2020   GFRAA 59 (L) 06/20/2020    Lab Results  Component Value Date   WBC 5.4 01/15/2021   NEUTROABS 3.0 01/15/2021   HGB 11.1 (L) 01/15/2021   HCT 33.0 (L) 01/15/2021   MCV 106.8 (H) 01/15/2021   PLT 366.0 01/15/2021     STUDIES: DG Chest 2 View  Result Date: 02/02/2021 CLINICAL DATA:   80 year old female with COPD. EXAM: CHEST - 2 VIEW COMPARISON:  Chest radiograph dated 09/18/2020. FINDINGS: Background of emphysema. No focal consolidation, pleural effusion, or pneumothorax. Borderline cardiomegaly. Atherosclerotic calcification of the aorta. No acute osseous pathology. IMPRESSION: No active cardiopulmonary disease. Electronically Signed   By: Anner Crete M.D.   On: 02/02/2021 13:53     ASSESSMENT: MGUS, macrocytic anemia.  PLAN:    1.  MGUS: Patient previously noted to have an M spike of 0.8.  Her IgM component of her immunoglobulins has trended up to 1305, but her kappa chains have trended down to 375.8.  She has a mild anemia, but no other evidence of endorgan damage.  Patient declined repeat laboratory work today. Patient does not require a metastatic bone survey or a bone marrow biopsy.  Patient does not wish any further follow-up and states that even if she progressed to multiple myeloma, she likely would decline treatment.  Recommend checking SPEP with immunoglobulins once per year.  No follow-up has been scheduled. 2.  Macrocytic anemia: Chronic and unchanged.  Patient's hemoglobin is trended up to greater than 11.0.  Previously all of her other laboratory work, including B12 and folate levels, was either negative or within normal limits.  Patient has a history of chemotherapy for her psoriasis, not her breast cancer, therefore underlying MDS is also possible.   3.  History of breast cancer: Patient underwent lumpectomy in 2001.  Her most recent mammogram on February 08, 2020 was reported as BI-RADS 2.  Repeat in July 2022.   Patient expressed understanding and was in agreement with this plan. She also understands that She can call clinic at any time with any questions, concerns, or complaints.    Lloyd Huger, MD   02/12/2021 7:55 AM

## 2021-02-11 ENCOUNTER — Other Ambulatory Visit: Payer: Self-pay

## 2021-02-11 ENCOUNTER — Inpatient Hospital Stay: Payer: Medicare Other | Attending: Oncology | Admitting: Oncology

## 2021-02-11 VITALS — BP 147/66 | HR 78 | Temp 97.2°F | Resp 18 | Wt 120.0 lb

## 2021-02-11 DIAGNOSIS — D472 Monoclonal gammopathy: Secondary | ICD-10-CM | POA: Insufficient documentation

## 2021-02-11 DIAGNOSIS — R5383 Other fatigue: Secondary | ICD-10-CM | POA: Insufficient documentation

## 2021-02-11 DIAGNOSIS — Z87891 Personal history of nicotine dependence: Secondary | ICD-10-CM | POA: Diagnosis not present

## 2021-02-11 DIAGNOSIS — D539 Nutritional anemia, unspecified: Secondary | ICD-10-CM | POA: Diagnosis not present

## 2021-02-12 NOTE — Telephone Encounter (Signed)
Patient had a visit with Dr. Caryl Comes, paitent does not want to do any physical therapy at this time.  [name],cma

## 2021-02-13 ENCOUNTER — Ambulatory Visit: Payer: Medicare Other | Admitting: Podiatry

## 2021-02-14 ENCOUNTER — Other Ambulatory Visit: Payer: Self-pay | Admitting: Internal Medicine

## 2021-02-17 ENCOUNTER — Encounter: Payer: Self-pay | Admitting: Family Medicine

## 2021-02-17 ENCOUNTER — Ambulatory Visit (INDEPENDENT_AMBULATORY_CARE_PROVIDER_SITE_OTHER): Payer: Medicare Other | Admitting: Family Medicine

## 2021-02-17 ENCOUNTER — Encounter: Payer: Self-pay | Admitting: Podiatry

## 2021-02-17 ENCOUNTER — Telehealth: Payer: Self-pay | Admitting: Internal Medicine

## 2021-02-17 ENCOUNTER — Ambulatory Visit (INDEPENDENT_AMBULATORY_CARE_PROVIDER_SITE_OTHER): Payer: Medicare Other | Admitting: Podiatry

## 2021-02-17 ENCOUNTER — Other Ambulatory Visit: Payer: Self-pay

## 2021-02-17 VITALS — BP 130/80 | HR 82 | Temp 98.6°F | Ht 67.0 in | Wt 120.0 lb

## 2021-02-17 DIAGNOSIS — J329 Chronic sinusitis, unspecified: Secondary | ICD-10-CM | POA: Insufficient documentation

## 2021-02-17 DIAGNOSIS — D689 Coagulation defect, unspecified: Secondary | ICD-10-CM

## 2021-02-17 DIAGNOSIS — M79674 Pain in right toe(s): Secondary | ICD-10-CM

## 2021-02-17 DIAGNOSIS — B351 Tinea unguium: Secondary | ICD-10-CM

## 2021-02-17 DIAGNOSIS — D472 Monoclonal gammopathy: Secondary | ICD-10-CM

## 2021-02-17 DIAGNOSIS — I6523 Occlusion and stenosis of bilateral carotid arteries: Secondary | ICD-10-CM | POA: Diagnosis not present

## 2021-02-17 DIAGNOSIS — E213 Hyperparathyroidism, unspecified: Secondary | ICD-10-CM

## 2021-02-17 DIAGNOSIS — F419 Anxiety disorder, unspecified: Secondary | ICD-10-CM

## 2021-02-17 DIAGNOSIS — M79675 Pain in left toe(s): Secondary | ICD-10-CM | POA: Diagnosis not present

## 2021-02-17 DIAGNOSIS — I872 Venous insufficiency (chronic) (peripheral): Secondary | ICD-10-CM

## 2021-02-17 DIAGNOSIS — M79676 Pain in unspecified toe(s): Secondary | ICD-10-CM

## 2021-02-17 DIAGNOSIS — J0100 Acute maxillary sinusitis, unspecified: Secondary | ICD-10-CM | POA: Diagnosis not present

## 2021-02-17 MED ORDER — AMOXICILLIN-POT CLAVULANATE 875-125 MG PO TABS
1.0000 | ORAL_TABLET | Freq: Two times a day (BID) | ORAL | 0 refills | Status: DC
Start: 2021-02-17 — End: 2021-07-02

## 2021-02-17 NOTE — Progress Notes (Signed)
Virtual Visit via video note  This visit type was conducted due to national recommendations for restrictions regarding the COVID-19 pandemic (e.g. social distancing).  This format is felt to be most appropriate for this patient at this time.  All issues noted in this document were discussed and addressed.  No physical exam was performed (except for noted visual exam findings with Video Visits).   I connected with Lorayne Bender today at 11:30 AM EDT by a video enabled telemedicine application and verified that I am speaking with the correct person using two identifiers. Location patient: office Location provider: work Persons participating in the virtual visit: patient, provider  I discussed the limitations, risks, security and privacy concerns of performing an evaluation and management service by telephone and the availability of in person appointments. I also discussed with the patient that there may be a patient responsible charge related to this service. The patient expressed understanding and agreed to proceed.  Reason for visit: Follow-up  HPI: Sinusitis: Patient notes sinus symptoms going on for several months.  She initially scheduled a visit though that was canceled due to the provider getting sick.  She has congestion and is blowing green mucus out of her nose.  No cough.  No fevers.  No changes to her chronic shortness of breath.  She has a headache that comes and goes.  She notes the mucus smells bad.  She has some postnasal drip.  No sore throat, taste disturbances, or smell disturbances.  She has received 2 vaccines and 1 booster.  She does report a negative COVID test at the start of her symptoms.  She has been using Tylenol, Vicks VapoRub, and nasal saline spray.  Hyperparathyroidism: Patient diagnosed with this based on lab work.  She notes no history of kidney stones.  She has declined endocrinology evaluation.  Anxiety: Patient notes generally she is doing well.  She did  have to take the Ativan a little more frequently recently with difficulty sleeping related to her sinus infection though typically does not have to take this often.  No depression.  She does typically have 1 alcoholic beverage daily.   ROS: See pertinent positives and negatives per HPI.  Past Medical History:  Diagnosis Date   (HFpEF) heart failure with preserved ejection fraction ([place])    a. 08/2017 Echo: EF 55-60%, no rwma, mild MR, mildly dil LA, nl RV fxn; b. 03/2020 Echo: EF 60-65%, no rwma, Gr2 DD. RVSP 44.55mHg. Mod dil LA. Mild MR; c. 09/2020 Echo: EF 50-55%, no rwma, mild LVH, mod red RV fxn, mildly dily RA.   Acute CHF (congestive heart failure) ([facility] 09/18/2020   Alcohol abuse 04/05/2020   [facility] ([facility] [place]   left breast   Cancer ([facility] [place]   left breast ca   Carotid arterial disease ([facility]    a. 10/2004 s/p L CEA; b. 12/2015 Carotid U/S: RICA 1-39%; b. LICA patent CEA site.   Closed right hip fracture ([facility] 09/20/2019   GERD (gastroesophageal reflux disease)    Hyperlipidemia    Hypertension    Osteoarthritis, multiple sites    Osteopenia    Persistent atrial fibrillation ([facility]    a. Dx 08/2017; b. CHA2DS2VASc = 6-->Pradaxa; c. 09/2017 Successful DCCV (second shock - 200J); d. 10/2017 Recurrent Afib-->flecainide started 11/2017; e. 06/2020 s/p DCCV (200J x 1); f. 06/2020 Recurrent AF->Flec inc 75bid; g. 09/2020 WCT->flec d/c'd->amio started.   Personal history of radiation therapy 2001   left breast ca   Psoriasis  Wide-complex tachycardia ([place])    a. 09/2020 in setting of presumed Flecainide toxicity.  Flecainide d/c'd.    Past Surgical History:  Procedure Laterality Date   ABDOMINAL HYSTERECTOMY     ANKLE FRACTURE SURGERY  4/08   left---hardware still in place   BREAST BIOPSY Left 2001   breast ca   BREAST EXCISIONAL BIOPSY Left yrs ago   benign   BREAST LUMPECTOMY Left 2001   f/u radiation   CARDIOVERSION N/A 10/04/2017   Procedure: CARDIOVERSION;  Surgeon: Wellington Hampshire, MD;  Location: [facility];  Service: Cardiovascular;  Laterality: N/A;   CARDIOVERSION N/A 12/16/2017   Procedure: CARDIOVERSION;  Surgeon: [name], MD;  Location: [facility];  Service: Cardiovascular;  Laterality: N/A;   CARDIOVERSION N/A 06/24/2020   Procedure: CARDIOVERSION;  Surgeon: Wellington Hampshire, MD;  Location: [facility];  Service: Cardiovascular;  Laterality: N/A;   CAROTID ENDARTERECTOMY Left 10/23/2004   FRACTURE SURGERY     OOPHORECTOMY     SHOULDER SURGERY  6/07   left   TONSILLECTOMY AND ADENOIDECTOMY     TOTAL HIP ARTHROPLASTY  2004   right    Family History  Problem Relation Age of Onset   Hypertension Brother    Pneumonia Mother    Leukemia Father    Heart disease Neg Hx    Diabetes Neg Hx    Breast cancer Neg Hx     SOCIAL HX: Former smoker   Current Outpatient Medications:    acetaminophen (TYLENOL) 325 MG tablet, Take 2 tablets (650 mg total) by mouth every 4 (four) hours as needed for headache or mild pain., Disp: , Rfl:    allopurinol (ZYLOPRIM) 100 MG tablet, Take 1.5 tablets (150 mg total) by mouth daily., Disp: 135 tablet, Rfl: 1   amiodarone (PACERONE) 200 MG tablet, Take 200 mg by mouth daily., Disp: , Rfl:    amLODipine (NORVASC) 5 MG tablet, Take 1 tablet (5 mg total) by mouth daily., Disp: 90 tablet, Rfl: 2   amoxicillin-clavulanate (AUGMENTIN) 875-125 MG tablet, Take 1 tablet by mouth 2 (two) times daily., Disp: 20 tablet, Rfl: 0   Chlorhexidine Gluconate Cloth 2 % PADS, Apply 6 each topically daily., Disp: , Rfl:    cholecalciferol (VITAMIN D) 25 MCG (1000 UNIT) tablet, Take 2 tablets (2,000 Units total) by mouth daily., Disp: , Rfl:    furosemide (LASIX) 40 MG tablet, Take 0.5 tablet (20 mg) by mouth every other day., Disp: , Rfl:    LORazepam (ATIVAN) 0.5 MG tablet, TAKE 1 TABLET BY MOUTH TWICE A DAY AS NEEDED FOR ANXIETY, Disp: 60 tablet, Rfl: 0   losartan (COZAAR) 50 MG tablet, TAKE 1 TABLET BY MOUTH EVERY DAY, Disp: 90 tablet,  Rfl: 1   mirtazapine (REMERON) 15 MG tablet, Take 1 tablet (15 mg total) by mouth at bedtime., Disp: , Rfl:    Multiple Vitamin (MULTIVITAMIN) capsule, Take 1 capsule by mouth daily., Disp: , Rfl:    Multiple Vitamins-Minerals (PRESERVISION AREDS 2+MULTI VIT PO), Take 1 capsule by mouth in the morning and at bedtime., Disp: , Rfl:    PRADAXA 150 MG CAPS capsule, TAKE 1 CAPSULE BY MOUTH TWICE A DAY, Disp: 180 capsule, Rfl: 3  EXAM:  VITALS per patient if applicable:  GENERAL: alert, oriented, appears well and in no acute distress  HEENT: atraumatic, conjunttiva clear, no obvious abnormalities on inspection of external nose and ears  NECK: normal movements of the head and neck  LUNGS: on inspection no signs  of respiratory distress, breathing rate appears normal, no obvious gross SOB, gasping or wheezing  CV: no obvious cyanosis  MS: moves all visible extremities without noticeable abnormality  PSYCH/NEURO: pleasant and cooperative, no obvious depression or anxiety, speech and thought processing grossly intact  ASSESSMENT AND PLAN:  Discussed the following assessment and plan:  Problem List Items Addressed This Visit     Anxiety    Generally doing well.  She will monitor.  I advised her not to take the Ativan when she has had an alcoholic beverage.       Hyperparathyroidism [facility])    Patient with secondary hyperparathyroidism based on recent labs.  Her vitamin D level was normal recently.  This could be related to her chronic kidney disease.  I did discuss endocrinology referral for further evaluation though she declines this at this time.  We will plan to recheck her labs in 3 to 4 weeks.       Relevant Orders   PTH, Intact and Calcium   MGUS (monoclonal gammopathy of unknown significance)    This is a chronic issue that is stable.  She did follow-up with hematology and they advised that the patient could have lab work yearly with me.  We will plan on yearly lab work in  January.       Sinusitis - Primary    The patient's symptoms are most consistent with sinusitis.  We will treat with Augmentin for 10 days given the duration of her symptoms.  If she is not improving with this she will let us know.       Relevant Medications   amoxicillin-clavulanate (AUGMENTIN) 875-125 MG tablet    Return in about 3 weeks (around 03/10/2021) for Labs, 3 months PCP, please cancel your visit for later in August.   I discussed the assessment and treatment plan with the patient. The patient was provided an opportunity to ask questions and all were answered. The patient agreed with the plan and demonstrated an understanding of the instructions.   The patient was advised to call back or seek an in-person evaluation if the symptoms worsen or if the condition fails to improve as anticipated.  [name], MD

## 2021-02-17 NOTE — Assessment & Plan Note (Signed)
Generally doing well.  She will monitor.  I advised her not to take the Ativan when she has had an alcoholic beverage.

## 2021-02-17 NOTE — Progress Notes (Signed)
This patient returns to my office for at risk foot care.  This patient requires this care by a professional since this patient will be at risk due to having lymphedema.  This patient is unable to cut nails herself since the patient cannot reach her nails.These nails are painful walking and wearing shoes.  This patient presents for at risk foot care today.  General Appearance  Alert, conversant and in no acute stress.  Vascular  Dorsalis pedis and posterior tibial  pulses are palpable  bilaterally.  Capillary return is within normal limits  bilaterally. Temperature is within normal limits  bilaterally.  Neurologic  Senn-Weinstein monofilament wire test within normal limits  bilaterally. Muscle power within normal limits bilaterally.  Nails Thick disfigured discolored nails with subungual debris  from hallux to fifth toes bilaterally. No evidence of bacterial infection or drainage bilaterally.  Orthopedic  No limitations of motion  feet .  No crepitus or effusions noted.  No bony pathology or digital deformities noted.  HAV  B/L.  Mallet toe third toe left foot.  Skin  normotropic skin with no porokeratosis noted bilaterally.  No signs of infections or ulcers noted.     Onychomycosis  Pain in right toes  Pain in left toes  Consent was obtained for treatment procedures.   Mechanical debridement of nails 1-5  bilaterally performed with a nail nipper.  Filed with dremel without incident.  Dispense crest pad.   Return office visit    3 months                  Told patient to return for periodic foot care and evaluation due to potential at risk complications.   Gardiner Barefoot DPM

## 2021-02-17 NOTE — Telephone Encounter (Signed)
I called and spoke with the patient. She was concerned as she had gotten "an email" from our office about her losartan RX and she was not sure what this was in regards to.  I have advised her that she had a refill sent in for losartan on 02/14/21, so I feel as though this was what the email was for.   The patient was then concerned as her medication list states "losartan", but her RX bottle states "losartan potassium." I have advised her that losartan potassium is the same thing as losartan.  She was very appreciative for the clarification and call back. She had no further questions/ concerns at this time.

## 2021-02-17 NOTE — Patient Instructions (Signed)
Nice to see you. Please take the Augmentin for sinus infection. If you are not improving with this please let us know.

## 2021-02-17 NOTE — Assessment & Plan Note (Signed)
The patient's symptoms are most consistent with sinusitis.  We will treat with Augmentin for 10 days given the duration of her symptoms.  If she is not improving with this she will let us know.

## 2021-02-17 NOTE — Assessment & Plan Note (Signed)
This is a chronic issue that is stable.  She did follow-up with hematology and they advised that the patient could have lab work yearly with me.  We will plan on yearly lab work in January.

## 2021-02-17 NOTE — Telephone Encounter (Signed)
Pt c/o medication issue:  1. Name of Medication: losartan 50 mg po q d   2. How are you currently taking this medication (dosage and times per day)? Please advise   3. Are you having a reaction (difficulty breathing--STAT)? no  4. What is your medication issue? Patient recently seen and has been waiting on a letter or msg to confirm medications to take since last visit.   Patient denied concerns about other medications .  States this is the only on she is unsure about.

## 2021-02-17 NOTE — Assessment & Plan Note (Signed)
Patient with secondary hyperparathyroidism based on recent labs.  Her vitamin D level was normal recently.  This could be related to her chronic kidney disease.  I did discuss endocrinology referral for further evaluation though she declines this at this time.  We will plan to recheck her labs in 3 to 4 weeks.

## 2021-03-03 ENCOUNTER — Telehealth: Payer: Self-pay | Admitting: Nurse Practitioner

## 2021-03-03 NOTE — Telephone Encounter (Signed)
Please contact pt for future appointment. ?Pt overdue for 3 month f/u. ?

## 2021-03-04 NOTE — Telephone Encounter (Signed)
Ok to refill. We just saw her on 01/23/21 and she was to come back in 6 months.   She shouldn't need an appointment until January.  Thanks!

## 2021-03-05 ENCOUNTER — Ambulatory Visit: Payer: Medicare Other | Admitting: Family Medicine

## 2021-03-10 ENCOUNTER — Other Ambulatory Visit (INDEPENDENT_AMBULATORY_CARE_PROVIDER_SITE_OTHER): Payer: Medicare Other

## 2021-03-10 ENCOUNTER — Other Ambulatory Visit: Payer: Self-pay

## 2021-03-10 DIAGNOSIS — E213 Hyperparathyroidism, unspecified: Secondary | ICD-10-CM | POA: Diagnosis not present

## 2021-03-13 LAB — PTH, INTACT AND CALCIUM
Calcium: 9.8 mg/dL (ref 8.6–10.4)
PTH: 70 pg/mL (ref 16–77)

## 2021-03-14 DIAGNOSIS — H353132 Nonexudative age-related macular degeneration, bilateral, intermediate dry stage: Secondary | ICD-10-CM | POA: Diagnosis not present

## 2021-03-29 ENCOUNTER — Other Ambulatory Visit: Payer: Self-pay | Admitting: Family Medicine

## 2021-04-04 ENCOUNTER — Other Ambulatory Visit: Payer: Self-pay | Admitting: Interventional Cardiology

## 2021-04-24 DIAGNOSIS — Z23 Encounter for immunization: Secondary | ICD-10-CM | POA: Diagnosis not present

## 2021-05-03 ENCOUNTER — Other Ambulatory Visit: Payer: Self-pay | Admitting: Family Medicine

## 2021-05-03 DIAGNOSIS — M1A9XX1 Chronic gout, unspecified, with tophus (tophi): Secondary | ICD-10-CM

## 2021-06-19 ENCOUNTER — Encounter: Payer: Self-pay | Admitting: Podiatry

## 2021-06-19 ENCOUNTER — Ambulatory Visit (INDEPENDENT_AMBULATORY_CARE_PROVIDER_SITE_OTHER): Payer: Medicare Other | Admitting: Podiatry

## 2021-06-19 ENCOUNTER — Other Ambulatory Visit: Payer: Self-pay

## 2021-06-19 DIAGNOSIS — B351 Tinea unguium: Secondary | ICD-10-CM

## 2021-06-19 DIAGNOSIS — I872 Venous insufficiency (chronic) (peripheral): Secondary | ICD-10-CM

## 2021-06-19 DIAGNOSIS — D689 Coagulation defect, unspecified: Secondary | ICD-10-CM

## 2021-06-19 DIAGNOSIS — M79676 Pain in unspecified toe(s): Secondary | ICD-10-CM

## 2021-06-19 NOTE — Progress Notes (Signed)
This patient returns to my office for at risk foot care.  This patient requires this care by a professional since this patient will be at risk due to having lymphedema.  This patient is unable to cut nails herself since the patient cannot reach her nails.These nails are painful walking and wearing shoes.  This patient presents for at risk foot care today.  General Appearance  Alert, conversant and in no acute stress.  Vascular  Dorsalis pedis and posterior tibial  pulses are palpable  bilaterally.  Capillary return is within normal limits  bilaterally. Temperature is within normal limits  bilaterally.  Neurologic  Senn-Weinstein monofilament wire test within normal limits  bilaterally. Muscle power within normal limits bilaterally.  Nails Thick disfigured discolored nails with subungual debris  from hallux to fifth toes bilaterally. No evidence of bacterial infection or drainage bilaterally.  Orthopedic  No limitations of motion  feet .  No crepitus or effusions noted.  No bony pathology or digital deformities noted.  HAV  B/L.  Mallet toe third toe left foot.  Skin  normotropic skin with no porokeratosis noted bilaterally.  No signs of infections or ulcers noted.     Onychomycosis  Pain in right toes  Pain in left toes  Consent was obtained for treatment procedures.   Mechanical debridement of nails 1-5  bilaterally performed with a nail nipper.  Filed with dremel without incident.  Dispense crest pad.   Return office visit    3 months                  Told patient to return for periodic foot care and evaluation due to potential at risk complications.   Gardiner Barefoot DPM

## 2021-06-26 DIAGNOSIS — Z20822 Contact with and (suspected) exposure to covid-19: Secondary | ICD-10-CM | POA: Diagnosis not present

## 2021-07-02 ENCOUNTER — Ambulatory Visit (INDEPENDENT_AMBULATORY_CARE_PROVIDER_SITE_OTHER): Payer: Medicare Other | Admitting: Family Medicine

## 2021-07-02 ENCOUNTER — Encounter: Payer: Self-pay | Admitting: Family Medicine

## 2021-07-02 ENCOUNTER — Other Ambulatory Visit: Payer: Self-pay

## 2021-07-02 VITALS — BP 140/80 | HR 91 | Temp 98.9°F | Ht 67.0 in | Wt 123.6 lb

## 2021-07-02 DIAGNOSIS — I6523 Occlusion and stenosis of bilateral carotid arteries: Secondary | ICD-10-CM | POA: Diagnosis not present

## 2021-07-02 DIAGNOSIS — D472 Monoclonal gammopathy: Secondary | ICD-10-CM

## 2021-07-02 DIAGNOSIS — N1832 Chronic kidney disease, stage 3b: Secondary | ICD-10-CM

## 2021-07-02 DIAGNOSIS — I480 Paroxysmal atrial fibrillation: Secondary | ICD-10-CM

## 2021-07-02 DIAGNOSIS — I10 Essential (primary) hypertension: Secondary | ICD-10-CM

## 2021-07-02 DIAGNOSIS — F419 Anxiety disorder, unspecified: Secondary | ICD-10-CM

## 2021-07-02 MED ORDER — LORAZEPAM 0.5 MG PO TABS: ORAL_TABLET | ORAL | 0 refills | Status: DC

## 2021-07-02 NOTE — Patient Instructions (Signed)
Nice to see you. We will get lab work prior to next visit. Please decrease your alcohol intake to 1 or fewer beverages per day. Do not take your lorazepam when you have had an alcoholic beverage that day.

## 2021-07-02 NOTE — Assessment & Plan Note (Addendum)
Generally stable though has had some increased anxiety at night.  Discussed the potential for increasing the mirtazapine though she would like to remain on her current regimen.  Advised that she not have any lorazepam when she has had alcoholic beverages.  Discussed reducing her alcohol intake may help with her anxiety and her sleep.  I encouraged her to reduce it to 1 or fewer beverages per day.  Discussed the risk of falls and dementia with chronic benzodiazepine use.  Discussed she needs to try to minimize use of the lorazepam is much as possible.

## 2021-07-02 NOTE — Assessment & Plan Note (Signed)
Discussed avoiding NSAIDs.  We will check kidney function at her next visit.

## 2021-07-02 NOTE — Progress Notes (Signed)
[name], MD Phone: [phone]  [name] is a 80 y.o. female who presents today for follow-up.  Hypertension: Generally well controlled per patient report.  She taking amlodipine and losartan.  No chest pain, shortness of breath, or edema.  CKD stage III: She is avoiding NSAIDs.  Anxiety: Patient reports this is generally stable.  She has not been sleeping quite as well so she has been taking slightly more lorazepam.  She will have weeks where she does not take any.  She worries about things in her life such as financial paperwork that she does not understand.  She takes mirtazapine and thinks that has been helpful.  She notes when she lays down at night her mind starts to run.  She does have 1 to 2 alcoholic beverages daily.  She notes those are in the early evening.  She does not take her lorazepam anytime near when she has had an alcoholic beverage.  Social History   Tobacco Use  Smoking Status Former   Packs/day: 1.00   Years: 40.00   Pack years: 40.00   Types: Cigarettes   Quit date: 07/21/1995   Years since quitting: 25.9  Smokeless Tobacco Never    Current Outpatient Medications on File Prior to Visit  Medication Sig Dispense Refill   acetaminophen (TYLENOL) 325 MG tablet Take 2 tablets (650 mg total) by mouth every 4 (four) hours as needed for headache or mild pain.     allopurinol (ZYLOPRIM) 100 MG tablet TAKE 1.5 TABLETS ($RemoveBefo'150MG'NLCvpdVlZck$  TOTAL) BY MOUTH DAILY 135 tablet 1   amiodarone (PACERONE) 200 MG tablet TAKE 1 TABLET BY MOUTH EVERY DAY 90 tablet 2   amLODipine (NORVASC) 5 MG tablet TAKE 1 TABLET (5 MG TOTAL) BY MOUTH DAILY. 90 tablet 2   Chlorhexidine Gluconate Cloth 2 % PADS Apply 6 each topically daily.     cholecalciferol (VITAMIN D) 25 MCG (1000 UNIT) tablet Take 2 tablets (2,000 Units total) by mouth daily.     furosemide (LASIX) 40 MG tablet Take 0.5 tablets (20 mg total) by mouth every other day. 45 tablet 2   losartan (COZAAR) 50 MG tablet TAKE 1  TABLET BY MOUTH EVERY DAY 90 tablet 1   mirtazapine (REMERON) 15 MG tablet Take 1 tablet (15 mg total) by mouth at bedtime.     Multiple Vitamin (MULTIVITAMIN) capsule Take 1 capsule by mouth daily.     Multiple Vitamins-Minerals (PRESERVISION AREDS 2+MULTI VIT PO) Take 1 capsule by mouth in the morning and at bedtime.     PRADAXA 150 MG CAPS capsule TAKE 1 CAPSULE BY MOUTH TWICE A DAY 180 capsule 3   No current facility-administered medications on file prior to visit.     ROS see history of present illness  Objective  Physical Exam Vitals:   07/02/21 0908  BP: 140/80  Pulse: 91  Temp: 98.9 F (37.2 C)  SpO2: 96%    BP Readings from Last 3 Encounters:  07/02/21 140/80  02/17/21 130/80  02/11/21 (!) 147/66   Wt Readings from Last 3 Encounters:  07/02/21 123 lb 9.6 oz (56.1 kg)  02/17/21 120 lb (54.4 kg)  02/11/21 120 lb (54.4 kg)    Physical Exam Constitutional:      General: She is not in acute distress.    Appearance: She is not diaphoretic.  Cardiovascular:     Rate and Rhythm: Normal rate and regular rhythm.     Heart sounds: Normal heart sounds.  Pulmonary:     Effort:  Pulmonary effort is normal.     Breath sounds: Normal breath sounds.  Musculoskeletal:     Right lower leg: No edema.     Left lower leg: No edema.  Skin:    General: Skin is warm and dry.  Neurological:     Mental Status: She is alert.     Assessment/Plan: Please see individual problem list.  Problem List Items Addressed This Visit     Anxiety    Generally stable though has had some increased anxiety at night.  Discussed the potential for increasing the mirtazapine though she would like to remain on her current regimen.  Advised that she not have any lorazepam when she has had alcoholic beverages.  Discussed reducing her alcohol intake may help with her anxiety and her sleep.  I encouraged her to reduce it to 1 or fewer beverages per day.  Discussed the risk of falls and dementia with  chronic benzodiazepine use.  Discussed she needs to try to minimize use of the lorazepam is much as possible.      Relevant Medications   LORazepam (ATIVAN) 0.5 MG tablet   Atrial fibrillation (HCC)    Them today.  She reports her insurance will no longer be covering Pradaxa.  I sent a message to her electrophysiologist so they are aware of this in advance of her visit in January.      Chronic kidney disease, stage 3, mod decreased GFR (HCC)    Discussed avoiding NSAIDs.  We will check kidney function at her next visit.      HTN (hypertension)    Generally stable.  She can continue amlodipine 5 mg once daily and losartan 50 mg daily.  Discussed she is due for lab work in January though she preferred to defer this until her next follow-up.      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  This visit occurred during the SARS-CoV-2 public health emergency.  Safety protocols were in place, including screening questions prior to the visit, additional usage of staff PPE, and extensive cleaning of exam room while observing appropriate contact time as indicated for disinfecting solutions.    [name], MD [place]

## 2021-07-02 NOTE — Assessment & Plan Note (Signed)
Generally stable.  She can continue amlodipine 5 mg once daily and losartan 50 mg daily.  Discussed she is due for lab work in January though she preferred to defer this until her next follow-up.

## 2021-07-02 NOTE — Assessment & Plan Note (Signed)
Them today.  She reports her insurance will no longer be covering Pradaxa.  I sent a message to her electrophysiologist so they are aware of this in advance of her visit in January.

## 2021-07-31 ENCOUNTER — Other Ambulatory Visit: Payer: Self-pay

## 2021-07-31 ENCOUNTER — Ambulatory Visit (INDEPENDENT_AMBULATORY_CARE_PROVIDER_SITE_OTHER): Payer: Medicare Other | Admitting: Internal Medicine

## 2021-07-31 ENCOUNTER — Other Ambulatory Visit: Payer: Medicare Other

## 2021-07-31 ENCOUNTER — Telehealth: Payer: Self-pay | Admitting: Internal Medicine

## 2021-07-31 ENCOUNTER — Encounter: Payer: Self-pay | Admitting: Internal Medicine

## 2021-07-31 VITALS — BP 181/78 | HR 71 | Ht 67.0 in | Wt 123.0 lb

## 2021-07-31 DIAGNOSIS — I4819 Other persistent atrial fibrillation: Secondary | ICD-10-CM | POA: Diagnosis not present

## 2021-07-31 DIAGNOSIS — I951 Orthostatic hypotension: Secondary | ICD-10-CM | POA: Diagnosis not present

## 2021-07-31 DIAGNOSIS — R001 Bradycardia, unspecified: Secondary | ICD-10-CM

## 2021-07-31 DIAGNOSIS — I10 Essential (primary) hypertension: Secondary | ICD-10-CM | POA: Diagnosis not present

## 2021-07-31 DIAGNOSIS — Z79899 Other long term (current) drug therapy: Secondary | ICD-10-CM

## 2021-07-31 NOTE — Telephone Encounter (Signed)
Dr. Caryl Comes, did you chat with her about this today at her office visit?

## 2021-07-31 NOTE — Telephone Encounter (Signed)
Pt c/o medication issue:  1. Name of Medication: pradaxa  2. How are you currently taking this medication (dosage and times per day)? 150 mg po BID  3. Are you having a reaction (difficulty breathing--STAT)? no  4. What is your medication issue? Patient lost paper from visit and cannot recall instructions for what to do now that off the market

## 2021-07-31 NOTE — Patient Instructions (Signed)
Medication Instructions:  - Your physician recommends that you continue on your current medications as directed. Please refer to the Current Medication list given to you today.  *If you need a refill on your cardiac medications before your next appointment, please call your pharmacy*   Lab Work: - Your physician recommends that you have lab work today: CMET/ CBC/ TSH  If you have labs (blood work) drawn today and your tests are completely normal, you will receive your results only by: Bright (if you have MyChart) OR A paper copy in the mail If you have any lab test that is abnormal or we need to change your treatment, we will call you to review the results.   Testing/Procedures: - none orderd   Follow-Up: At [facility], you and your health needs are our priority.  As part of our continuing mission to provide you with exceptional heart care, we have created designated Provider Care Teams.  These Care Teams include your primary Cardiologist (physician) and Advanced Practice Providers (APPs -  Physician Assistants and Nurse Practitioners) who all work together to provide you with the care you need, when you need it.  We recommend signing up for the patient portal called "MyChart".  Sign up information is provided on this After Visit Summary.  MyChart is used to connect with patients for Virtual Visits (Telemedicine).  Patients are able to view lab/test results, encounter notes, upcoming appointments, etc.  Non-urgent messages can be sent to your provider as well.   To learn more about what you can do with MyChart, go to [email or web address].    Your next appointment:   6 month(s)  The format for your next appointment:   In Person  Provider:   Virl Axe, MD    Other Instructions N/a

## 2021-07-31 NOTE — Progress Notes (Signed)
Patient ID: [name], female   DOB: 08-Nov-1940, 81 y.o.   MRN: [id number]     Patient Care Team: [name], MD as PCP - General (Family Medicine) [name], MD as PCP - Cardiology (Cardiology) [name], MD (Family Medicine)   HPI   [name] is a 81 y.o. female seen in follow-up for recurrent atrial fibrillation, treated with  flecainide >> amiodarone and anticoagulation with dabigitran   Has had hospitalizations related to HFpEF with rapid AF. Recurrent cardioversions. Most recently 3/22 at which time the flecainide was replaced with amiodarone   9/21 ER following a fall while Inebriated ( ER BAL >200) Seen and Discussed with DR TT--drinking at least 2 ounces of vodka at night and some wine.  There have been concerns from the children about drinking too much She have reduced her alcohol to intermittently a glass of wine before her cocktail watching the news  The patient denies chest pain, shortness of breath, nocturnal dyspnea, orthopnea or peripheral edema.  There have been no palpitations, lightheadedness or syncope.     Patient denies symptoms of GI intolerance, sun sensitivity, neurological symptoms attributable to amiodarone.     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   BREAST EXCISIONAL BIOPSY Left yrs ago   benign   BREAST LUMPECTOMY Left 2001    f/u radiation   CARDIOVERSION N/A 10/04/2017   Procedure: CARDIOVERSION;  Surgeon: Wellington Hampshire, MD;  Location: [facility];  Service: Cardiovascular;  Laterality: N/A;   CARDIOVERSION N/A 12/16/2017   Procedure: CARDIOVERSION;  Surgeon: [name], MD;  Location: [facility];  Service: Cardiovascular;  Laterality: N/A;   CARDIOVERSION N/A 06/24/2020   Procedure: CARDIOVERSION;  Surgeon: Wellington Hampshire, MD;  Location: [facility];  Service: Cardiovascular;  Laterality: N/A;   CAROTID ENDARTERECTOMY Left 10/23/2004   FRACTURE SURGERY     OOPHORECTOMY     SHOULDER SURGERY  6/07   left   TONSILLECTOMY AND ADENOIDECTOMY     TOTAL HIP ARTHROPLASTY  2004   right    Current Outpatient Medications  Medication Sig Dispense Refill   acetaminophen (TYLENOL) 325 MG tablet Take 2 tablets (650 mg total) by mouth every 4 (four) hours as needed for headache or mild pain.     allopurinol (ZYLOPRIM) 100 MG tablet TAKE 1.5 TABLETS (150MG  TOTAL) BY MOUTH DAILY 135 tablet 1   amiodarone (PACERONE) 200 MG tablet TAKE 1 TABLET BY MOUTH EVERY DAY 90 tablet 2   amLODipine (NORVASC) 5 MG tablet TAKE 1 TABLET (5 MG TOTAL) BY MOUTH  DAILY. 90 tablet 2   cholecalciferol (VITAMIN D) 25 MCG (1000 UNIT) tablet Take 2 tablets (2,000 Units total) by mouth daily.     furosemide (LASIX) 40 MG tablet Take 0.5 tablets (20 mg total) by mouth every other day. 45 tablet 2   LORazepam (ATIVAN) 0.5 MG tablet TAKE 1 TABLET BY MOUTH TWICE A DAY AS NEEDED FOR ANXIETY 60 tablet 0   losartan (COZAAR) 50 MG tablet TAKE 1 TABLET BY MOUTH EVERY DAY 90 tablet 1   mirtazapine (REMERON) 15 MG tablet Take 1 tablet (15 mg total) by mouth at bedtime.     Multiple Vitamin (MULTIVITAMIN) capsule Take 1 capsule by mouth daily.     Multiple Vitamins-Minerals (PRESERVISION AREDS 2+MULTI VIT PO) Take 1 capsule by mouth in the morning and at bedtime.     PRADAXA 150 MG CAPS capsule TAKE 1 CAPSULE BY MOUTH TWICE A DAY 180 capsule 3   Chlorhexidine  Gluconate Cloth 2 % PADS Apply 6 each topically daily. (Patient not taking: Reported on 07/31/2021)     No current facility-administered medications for this visit.    Allergies  Allergen Reactions   Adhesive [Tape] Other (See Comments)    "Does not work well with me. Can take my skin off. Careful"   Review of Systems negative except from HPI and PMH  Physical Exam: BP (!) 181/78 (BP Location: Right Arm, Patient Position: Sitting, Cuff Size: Normal)    Pulse 71    Ht 5\' 7"  (1.702 m)    Wt 123 lb (55.8 kg)    SpO2 99%    BMI 19.26 kg/m  Well developed and nourished in no acute distress HENT normal Neck supple with JVP-  flat   Clear Regular rate and rhythm, no murmurs or gallops Abd-soft with active BS No Clubbing cyanosis edema Skin-warm and dry A & Oriented  Grossly normal sensory and motor function  ECG sinus    Assessment and  Plan  Atrial fibrillation-persistent with a rapid rate  Sinus brady  Alcohol use/ (As above)   Anemia   Renal insufficiency grade 3  Hypertension   Orthostatic hypotension  Peripheral vascular disease carotid artery occlusion  Atrial fib- quiet and will  continue amiodarone at 200 mg daily-- at next visit will plan to wean if no interval afib  No bleeding on dabigitran however, it will no longer be covered by insurance, and so she will check wih pharmacy to check cost of Rivaroxaban and Apixoban    BP markedly elevated but says at home in the 130's  in thepast when she stopped checking about 4 weeks ago.  She will resume checking again   continue amlodipine 5 and losartan 50   Euvolemic continue furosemide 20 qod

## 2021-08-01 ENCOUNTER — Telehealth: Payer: Self-pay

## 2021-08-01 DIAGNOSIS — Z79899 Other long term (current) drug therapy: Secondary | ICD-10-CM

## 2021-08-01 DIAGNOSIS — I4819 Other persistent atrial fibrillation: Secondary | ICD-10-CM

## 2021-08-01 DIAGNOSIS — E875 Hyperkalemia: Secondary | ICD-10-CM

## 2021-08-01 LAB — COMPREHENSIVE METABOLIC PANEL
ALT: 19 IU/L (ref 0–32)
AST: 30 IU/L (ref 0–40)
Albumin/Globulin Ratio: 1.6 (ref 1.2–2.2)
Albumin: 4.1 g/dL (ref 3.6–4.6)
Alkaline Phosphatase: 90 IU/L (ref 44–121)
BUN/Creatinine Ratio: 24 (ref 12–28)
BUN: 24 mg/dL (ref 8–27)
Bilirubin Total: 0.3 mg/dL (ref 0.0–1.2)
CO2: 24 mmol/L (ref 20–29)
Calcium: 9.8 mg/dL (ref 8.7–10.3)
Chloride: 107 mmol/L — ABNORMAL HIGH (ref 96–106)
Creatinine, Ser: 1.01 mg/dL — ABNORMAL HIGH (ref 0.57–1.00)
Globulin, Total: 2.6 g/dL (ref 1.5–4.5)
Glucose: 107 mg/dL — ABNORMAL HIGH (ref 70–99)
Potassium: 5.4 mmol/L — ABNORMAL HIGH (ref 3.5–5.2)
Sodium: 145 mmol/L — ABNORMAL HIGH (ref 134–144)
Total Protein: 6.7 g/dL (ref 6.0–8.5)
eGFR: 56 mL/min/{1.73_m2} — ABNORMAL LOW (ref >59)

## 2021-08-01 LAB — CBC
Hematocrit: 33.9 % — ABNORMAL LOW (ref 34.0–46.6)
Hemoglobin: 11.7 g/dL (ref 11.1–15.9)
MCH: 35 pg — ABNORMAL HIGH (ref 26.6–33.0)
MCHC: 34.5 g/dL (ref 31.5–35.7)
MCV: 102 fL — ABNORMAL HIGH (ref 79–97)
Platelets: 280 10*3/uL (ref 150–450)
RBC: 3.34 x10E6/uL — ABNORMAL LOW (ref 3.77–5.28)
RDW: 12.8 % (ref 11.7–15.4)
WBC: 6.1 10*3/uL (ref 3.4–10.8)

## 2021-08-01 LAB — TSH: TSH: 2.26 u[IU]/mL (ref 0.450–4.500)

## 2021-08-01 MED ORDER — AMLODIPINE BESYLATE 10 MG PO TABS: 10.0000 mg | ORAL_TABLET | Freq: Every day | ORAL | 3 refills | Status: DC

## 2021-08-01 NOTE — Telephone Encounter (Signed)
H  GM She told me that dabigitran would no longer be covered and she was to discuss with pharmacy re costs and let us know what she would choose to replace it Thanks SK

## 2021-08-01 NOTE — Telephone Encounter (Signed)
Spoke with the DOD Dr. Marlou Porch... re: the pt K 5.4... pt to stop Cozaar and increase her Amlodipine to 10 mg daily... and to repeat her K in one week at the [place] office... will forward to [facility] to help set up her redraw.

## 2021-08-04 NOTE — Telephone Encounter (Signed)
Noted- separate phone note open for a different issue with her blood thinner.  I will call the patient back today.

## 2021-08-04 NOTE — Telephone Encounter (Signed)
The patient called back to the office. I have advised her of Dr. Olin Pia response as stated below.  Per the patient, her understanding is that I would call her pharmacy and see what is preferred on her RX drug plan/ cost of the blood thinners.  I advised the patient, that it is the patient's responsibility to contact their insurance provider to see what is preferred/ cost of the medication.  I have also advised the patient that Pradaxa should be going generic soon, so I would presume this would be preferred on most drug plans, but to please call her insurance company to confirm.   The patient voices understanding and is agreeable. She will call me back once she has spoken with her insurance.

## 2021-08-04 NOTE — Telephone Encounter (Signed)
The patient called back to the office at ~ 1025.  I was on another line.  I have attempted to call her back. No answer- I left a message I will try her back later today, but she can feel to call me at any time to discuss her blood thinner.

## 2021-08-04 NOTE — Telephone Encounter (Signed)
Attempted to call the patient. No answer- I left a message to please call back.  

## 2021-08-04 NOTE — Telephone Encounter (Signed)
Patient returning call  Patient scheduled for repeat lab 01/23

## 2021-08-08 ENCOUNTER — Telehealth: Payer: Self-pay | Admitting: Internal Medicine

## 2021-08-08 NOTE — Telephone Encounter (Signed)
Patient returning call .  She will not be available via phone until after 2 .

## 2021-08-08 NOTE — Telephone Encounter (Signed)
Attempted to call the patient. No answer- I left a message to please call back.  

## 2021-08-08 NOTE — Telephone Encounter (Signed)
I spoke with the patient. She advised she was currently in a restaurant, but is coming in for lab work on Monday and would like to discuss her medications with me.  I have advised her I will be in clinic with a doctor that day, but to please mention to whomever is drawing her lab work that she needs to speak with me and I will try to step out and talk with her.  The patient voices understanding and is agreeable.

## 2021-08-08 NOTE — Telephone Encounter (Signed)
Attempted to call the patient back. No answer - I left a message she may call me back and ask for me, but I will also try her back in a few minutes.

## 2021-08-08 NOTE — Telephone Encounter (Signed)
Patient needs to discuss Paradaxa, she states this medication is being discontinued, per her pharmacy, and she would like to discuss options. Please call.

## 2021-08-11 ENCOUNTER — Telehealth: Payer: Self-pay | Admitting: Internal Medicine

## 2021-08-11 ENCOUNTER — Other Ambulatory Visit: Payer: Self-pay

## 2021-08-11 ENCOUNTER — Other Ambulatory Visit (INDEPENDENT_AMBULATORY_CARE_PROVIDER_SITE_OTHER): Payer: Medicare Other

## 2021-08-11 DIAGNOSIS — Z79899 Other long term (current) drug therapy: Secondary | ICD-10-CM | POA: Diagnosis not present

## 2021-08-11 DIAGNOSIS — E875 Hyperkalemia: Secondary | ICD-10-CM | POA: Diagnosis not present

## 2021-08-11 DIAGNOSIS — I4819 Other persistent atrial fibrillation: Secondary | ICD-10-CM

## 2021-08-11 NOTE — Telephone Encounter (Signed)
Noted- see 08/08/21 phone note.

## 2021-08-11 NOTE — Telephone Encounter (Signed)
Patient states that "her problem is solved with Pradaxa".

## 2021-08-11 NOTE — Telephone Encounter (Signed)
See 08/11/21 phone note.

## 2021-08-12 LAB — BASIC METABOLIC PANEL
BUN/Creatinine Ratio: 20 (ref 12–28)
BUN: 28 mg/dL — ABNORMAL HIGH (ref 8–27)
CO2: 26 mmol/L (ref 20–29)
Calcium: 9.9 mg/dL (ref 8.7–10.3)
Chloride: 103 mmol/L (ref 96–106)
Creatinine, Ser: 1.43 mg/dL — ABNORMAL HIGH (ref 0.57–1.00)
Glucose: 105 mg/dL — ABNORMAL HIGH (ref 70–99)
Potassium: 4.6 mmol/L (ref 3.5–5.2)
Sodium: 145 mmol/L — ABNORMAL HIGH (ref 134–144)
eGFR: 37 mL/min/{1.73_m2} — ABNORMAL LOW (ref >59)

## 2021-08-14 ENCOUNTER — Telehealth: Payer: Self-pay | Admitting: Internal Medicine

## 2021-08-14 MED ORDER — FUROSEMIDE 40 MG PO TABS: ORAL_TABLET | ORAL | 2 refills | Status: DC

## 2021-08-14 NOTE — Telephone Encounter (Signed)
Dana Sprang, MD  08/13/2021  5:43 PM EST     Please Inform Patient   Labs are normal x perhaps a little bit dry  clarify her furosemide frequency  chart says qod-- if so we should just stop and take prn for edema    Thanks

## 2021-08-14 NOTE — Telephone Encounter (Signed)
I spoke with the patient regarding her lab results and Dr. Olin Pia recommendations to: 1) Stop furosemide QOD and only take 0.5 tablet (20 mg) as needed for swelling  I have advised the patient this is only thing we are changing today, everything else will stay the same.   The patient voices understanding and is agreeable.

## 2021-08-14 NOTE — Telephone Encounter (Signed)
Attempted to contact the patient. No answer- I left a message to please call back.

## 2021-08-15 NOTE — Telephone Encounter (Signed)
Patient calling Would like to know if she should resume losartan  Please call to clarify

## 2021-08-15 NOTE — Telephone Encounter (Signed)
I spoke with the patient. I have advised her that she will need to stay off of the losartan at this time.  The patient voices understanding and is agreeable.

## 2021-08-16 ENCOUNTER — Other Ambulatory Visit: Payer: Self-pay | Admitting: Internal Medicine

## 2021-08-19 ENCOUNTER — Encounter: Payer: Self-pay | Admitting: Adult Health

## 2021-08-19 ENCOUNTER — Other Ambulatory Visit: Payer: Self-pay

## 2021-08-19 ENCOUNTER — Ambulatory Visit (INDEPENDENT_AMBULATORY_CARE_PROVIDER_SITE_OTHER): Payer: Medicare Other | Admitting: Adult Health

## 2021-08-19 ENCOUNTER — Ambulatory Visit (INDEPENDENT_AMBULATORY_CARE_PROVIDER_SITE_OTHER): Payer: Medicare Other

## 2021-08-19 VITALS — BP 116/80 | HR 75 | Temp 97.4°F | Resp 14 | Ht 67.0 in | Wt 123.0 lb

## 2021-08-19 DIAGNOSIS — S91002A Unspecified open wound, left ankle, initial encounter: Secondary | ICD-10-CM | POA: Diagnosis not present

## 2021-08-19 DIAGNOSIS — M25572 Pain in left ankle and joints of left foot: Secondary | ICD-10-CM | POA: Diagnosis not present

## 2021-08-19 DIAGNOSIS — S91002D Unspecified open wound, left ankle, subsequent encounter: Secondary | ICD-10-CM | POA: Insufficient documentation

## 2021-08-19 DIAGNOSIS — M25472 Effusion, left ankle: Secondary | ICD-10-CM | POA: Diagnosis not present

## 2021-08-19 MED ORDER — CEPHALEXIN 500 MG PO CAPS: 500.0000 mg | ORAL_CAPSULE | Freq: Three times a day (TID) | ORAL | 0 refills | Status: DC

## 2021-08-19 NOTE — Progress Notes (Signed)
Acute Office Visit  Subjective:    Patient ID: [name], female    DOB: [birth year], 81 y.o.   MRN: [id number]  Chief Complaint  Patient presents with   Acute Visit    Wound on L foot, due to rubbing against foot. Noticed it 6 wks ago pain on inside of ankle able to walk on foot w/o pain.     HPI Patient is in today for pain in her right medial ankle she feels her shoe has been rubbing against area , it was open and draining but has since closed and seemed to callus over. She has mild swelling in ankle around wound.  Denies any history of blood clot. Did have carotid artery blockage in past.  She does have a history of poor circulation and history of foot surgery as below.  Her left foot has plate and 8 screws in 2018 surgery for foot.  Denies any calf pain, erythema, or warmth.  Sees Dr. Tressa Busman for podiatry.  Dr. Marry Guan orthopedics did hip surgery.  Patient  denies any fever, body aches,chills, rash, chest pain, shortness of breath, nausea, vomiting, or diarrhea.  Denies dizziness, lightheadedness, pre syncopal or syncopal episodes.    Past Medical History:  Diagnosis Date   (HFpEF) heart failure with preserved ejection fraction ([place])    a. 08/2017 Echo: EF 55-60%, no rwma, mild MR, mildly dil LA, nl RV fxn; b. 03/2020 Echo: EF 60-65%, no rwma, Gr2 DD. RVSP 44.89mHg. Mod dil LA. Mild MR; c. 09/2020 Echo: EF 50-55%, no rwma, mild LVH, mod red RV fxn, mildly dily RA.   Acute CHF (congestive heart failure) ([facility] 09/18/2020   Alcohol abuse 04/05/2020   Breast cancer ([facility] [place]   left breast   Cancer ([facility] [place]   left breast ca   Carotid arterial disease ([facility] [place]    a. 10/2004 s/p L CEA; b. 12/2015 Carotid U/S: RICA 1-39%; b. LICA patent CEA site.   Closed right hip fracture ([facility] 09/20/2019   GERD (gastroesophageal reflux disease)    Hyperlipidemia    Hypertension    Osteoarthritis, multiple sites    Osteopenia    Persistent atrial fibrillation ([facility]    a. Dx 08/2017; b.  CHA2DS2VASc = 6-->Pradaxa; c. 09/2017 Successful DCCV (second shock - 200J); d. 10/2017 Recurrent Afib-->flecainide started 11/2017; e. 06/2020 s/p DCCV (200J x 1); f. 06/2020 Recurrent AF->Flec inc 75bid; g. 09/2020 WCT->flec d/c'd->amio started.   Personal history of radiation therapy 2001   left breast ca   Psoriasis    Wide-complex tachycardia    a. 09/2020 in setting of presumed Flecainide toxicity.  Flecainide d/c'd.    Past Surgical History:  Procedure Laterality Date   ABDOMINAL HYSTERECTOMY     ANKLE FRACTURE SURGERY  4/08   left---hardware still in place   BREAST BIOPSY Left 2001   breast ca   BREAST EXCISIONAL BIOPSY Left yrs ago   benign   BREAST LUMPECTOMY Left 2001   f/u radiation   CARDIOVERSION N/A 10/04/2017   Procedure: CARDIOVERSION;  Surgeon: [name] MD;  [facility];  Service: Cardiovascular;  Laterality: N/A;   CARDIOVERSION N/A 12/16/2017   Procedure: CARDIOVERSION;  Surgeon: [name] MD;  Location: [facility];  Service: Cardiovascular;  Laterality: N/A;   CARDIOVERSION N/A 06/24/2020   Procedure: CARDIOVERSION;  Surgeon: [name] MD;  Location: [facility];  Service: Cardiovascular;  Laterality: N/A;   CAROTID ENDARTERECTOMY Left 10/23/2004   FRACTURE SURGERY  OOPHORECTOMY     SHOULDER SURGERY  6/07   left   TONSILLECTOMY AND ADENOIDECTOMY     TOTAL HIP ARTHROPLASTY  2004   right    Family History  Problem Relation Age of Onset   Hypertension Brother    Pneumonia Mother    Leukemia Father    Heart disease Neg Hx    Diabetes Neg Hx    Breast cancer Neg Hx     Social History   Socioeconomic History   Marital status: Widowed    Spouse name: Not on file   Number of children: Not on file   Years of education: Not on file   Highest education level: Not on file  Occupational History   Occupation: Marketing    Comment: Retired  Tobacco Use   Smoking status: Former    Packs/day: 1.00    Years: 40.00    Pack years:  40.00    Types: Cigarettes    Quit date: 07/21/1995    Years since quitting: 26.0   Smokeless tobacco: Never  Vaping Use   Vaping Use: Never used  Substance and Sexual Activity   Alcohol use: Yes    Alcohol/week: 2.0 standard drinks    Types: 1 Glasses of wine, 1 Standard drinks or equivalent per week    Comment: 1 glass of wine and 1 drink of liquor daily   Drug use: No   Sexual activity: Never  Other Topics Concern   Not on file  Social History Narrative   1 natural child   3 adopted children   Artist---still teaches water colors   Husband has Alzheimers      Has living will   Daughter Amy is health care POA   DNR    No tube feeds if cognitively unaware   Social Determinants of Health   Financial Resource Strain: Low Risk    Difficulty of Paying Living Expenses: Not hard at all  Food Insecurity: No Food Insecurity   Worried About Charity fundraiser in the Last Year: Never true   Ran Out of Food in the Last Year: Never true  Transportation Needs: No Transportation Needs   Lack of Transportation (Medical): No   Lack of Transportation (Non-Medical): No  Physical Activity: Unknown   Days of Exercise per Week: 0 days   Minutes of Exercise per Session: Not on file  Stress: No Stress Concern Present   Feeling of Stress : Only a little  Social Connections: Unknown   Frequency of Communication with Friends and Family: More than three times a week   Frequency of Social Gatherings with Friends and Family: More than three times a week   Attends Religious Services: Not on Electrical engineer or Organizations: Not on file   Attends Archivist Meetings: Not on file   Marital Status: Not on file  Intimate Partner Violence: Not At Risk   Fear of Current or Ex-Partner: No   Emotionally Abused: No   Physically Abused: No   Sexually Abused: No    Outpatient Medications Prior to Visit  Medication Sig Dispense Refill   acetaminophen (TYLENOL) 325 MG tablet  Take 2 tablets (650 mg total) by mouth every 4 (four) hours as needed for headache or mild pain.     allopurinol (ZYLOPRIM) 100 MG tablet TAKE 1.5 TABLETS (150MG TOTAL) BY MOUTH DAILY 135 tablet 1   amiodarone (PACERONE) 200 MG tablet TAKE 1 TABLET BY MOUTH EVERY DAY 90 tablet  2  ° amLODipine (NORVASC) 10 MG tablet Take 1 tablet (10 mg total) by mouth daily. 90 tablet 3  ° cholecalciferol (VITAMIN D) 25 MCG (1000 UNIT) tablet Take 2 tablets (2,000 Units total) by mouth daily.    ° furosemide (LASIX) 40 MG tablet Take 0.5 tablet (20 mg) by mouth once daily as needed for swelling 45 tablet 2  ° LORazepam (ATIVAN) 0.5 MG tablet TAKE 1 TABLET BY MOUTH TWICE A DAY AS NEEDED FOR ANXIETY 60 tablet 0  ° mirtazapine (REMERON) 15 MG tablet Take 1 tablet (15 mg total) by mouth at bedtime.    ° Multiple Vitamin (MULTIVITAMIN) capsule Take 1 capsule by mouth daily.    ° Multiple Vitamins-Minerals (PRESERVISION AREDS 2+MULTI VIT PO) Take 1 capsule by mouth in the morning and at bedtime.    ° PRADAXA 150 MG CAPS capsule TAKE 1 CAPSULE BY MOUTH TWICE A DAY 180 capsule 3  ° Chlorhexidine Gluconate Cloth 2 % PADS Apply 6 each topically daily. (Patient not taking: Reported on 07/31/2021)    ° °No facility-administered medications prior to visit.  ° ° °No Known Allergies ° ° °Review of Systems  °Constitutional: Negative.   °HENT: Negative.    °Respiratory: Negative.    °Cardiovascular: Negative.   °Gastrointestinal: Negative.   °Genitourinary: Negative.   °Musculoskeletal:  Positive for arthralgias and joint swelling. Negative for back pain, gait problem, myalgias, neck pain and neck stiffness.  °Neurological: Negative.   °Psychiatric/Behavioral: Negative.    ° °   °Objective:  °  °Physical Exam °Musculoskeletal:  °   Cervical back: Normal.  °   Thoracic back: Normal.  °   Lumbar back: Normal.  °   Right lower leg: Edema (trace right ankle medial.) present.  °   Right ankle: Normal. Normal pulse.  °   Right Achilles Tendon: Normal.  °    Left ankle: No swelling, deformity, ecchymosis or lacerations. Tenderness present over the medial malleolus. Normal range of motion. Normal pulse.  °   Left Achilles Tendon: Normal.  °   Right foot: Normal pulse.  °   Left foot: Normal pulse.  °   Comments: 1 + DP/ PT pulses bilaterally °Iron staining of lower extremities bilaterally.  °PVD both lower extremities noted.   °Skin: °   General: Skin is warm.  °   Findings: Erythema (very mild around ankle wound.left medial ankle only.) present.  ° ° ° °General: Appearance:    Thin female in no acute distress  °Eyes:    PERRL, conjunctiva/corneas clear, EOM's intact       °Lungs:     Clear to auscultation bilaterally, respirations unlabored  °Heart:    Normal heart rate. Regular rhythm. No murmurs, rubs, or gallops.   °MS:   All extremities are intact.  °  °Neurologic:   Awake, alert, oriented x 3. No apparent focal neurological           defect.   °  °BP 116/80 (BP Location: Left Arm, Patient Position: Sitting, Cuff Size: Small)    Pulse 75    Temp (!) 97.4 °F (36.3 °C) (Oral)    Resp 14    Ht 5' 7" (1.702 m)    Wt 123 lb (55.8 kg)    SpO2 99%    BMI 19.26 kg/m²  °Wt Readings from Last 3 Encounters:  °08/19/21 123 lb (55.8 kg)  °07/31/21 123 lb (55.8 kg)  °07/02/21 123 lb 9.6 oz (56.1 kg)  ° ° °  Health Maintenance Due  Topic Date Due   TETANUS/TDAP  03/07/2021    There are no preventive care reminders to display for this patient.   Lab Results  Component Value Date   TSH 2.260 07/31/2021   Lab Results  Component Value Date   WBC 6.1 07/31/2021   HGB 11.7 07/31/2021   HCT 33.9 (L) 07/31/2021   MCV 102 (H) 07/31/2021   PLT 280 07/31/2021   Lab Results  Component Value Date   NA 145 (H) 08/11/2021   K 4.6 08/11/2021   CO2 26 08/11/2021   GLUCOSE 105 (H) 08/11/2021   BUN 28 (H) 08/11/2021   CREATININE 1.43 (H) 08/11/2021   BILITOT 0.3 07/31/2021   ALKPHOS 90 07/31/2021   AST 30 07/31/2021   ALT 19 07/31/2021   PROT 6.7 07/31/2021   ALBUMIN  4.1 07/31/2021   CALCIUM 9.9 08/11/2021   ANIONGAP 11 10/14/2020   EGFR 37 (L) 08/11/2021   GFR 48.06 (L) 01/15/2021   Lab Results  Component Value Date   CHOL 216 (H) 01/11/2019   Lab Results  Component Value Date   HDL 88.90 01/11/2019   Lab Results  Component Value Date   LDLCALC 104 (H) 01/11/2019   Lab Results  Component Value Date   TRIG 116.0 01/11/2019   Lab Results  Component Value Date   CHOLHDL 2 01/11/2019   Lab Results  Component Value Date   HGBA1C 5.3 12/18/2014       Assessment & Plan:   Problem List Items Addressed This Visit       Other   Pain and swelling of left ankle - Primary   Relevant Orders   DG Foot Complete Left   DG Tibia/Fibula Left   Wound of left ankle    Meds ordered this encounter  Medications   cephALEXin (KEFLEX) 500 MG capsule    Sig: Take 1 capsule (500 mg total) by mouth 3 (three) times daily.    Dispense:  30 capsule    Refill:  0  Stage 1 wound, 3 cm x 3 cm, closed, no drainage, only mild erythema around. Offered DVT right lower extremity  ultrasound asa well given PVD and atrial fibrillation, she politely declined at this time. She will call if she should decide to proceed. Recheck in one week.  Her shoe has been rubbing area,she has not been wearing socks, recommend that she have padding with gauze and a sock. Wound care discussed. Signs of worsening infection discussed.  GFR mild decrease.         Meds ordered this encounter  Medications   cephALEXin (KEFLEX) 500 MG capsule    Sig: Take 1 capsule (500 mg total) by mouth 3 (three) times daily.    Dispense:  30 capsule    Refill:  0   Red Flags discussed. The patient was given clear instructions to go to ER or return to medical center if any red flags develop, symptoms do not improve, worsen or new problems develop. They verbalized understanding.  Return in about 1 week (around 08/26/2021), or if symptoms worsen or fail to improve, for at any time for any  worsening symptoms, Go to Emergency room/ urgent care if worse.   Marcille Buffy, FNP

## 2021-08-19 NOTE — Assessment & Plan Note (Signed)
Meds ordered this encounter  Medications   cephALEXin (KEFLEX) 500 MG capsule    Sig: Take 1 capsule (500 mg total) by mouth 3 (three) times daily.    Dispense:  30 capsule    Refill:  0  Stage 1 wound, 3 cm x 3 cm, closed, no drainage, only mild erythema around. Offered DVT right lower extremity  ultrasound asa well given PVD and atrial fibrillation, she politely declined at this time. She will call if she should decide to proceed. Recheck in one week.  Her shoe has been rubbing area,she has not been wearing socks, recommend that she have padding with gauze and a sock. Wound care discussed. Signs of worsening infection discussed.  GFR mild decrease.

## 2021-08-19 NOTE — Patient Instructions (Signed)
Wound Care, Adult Taking care of your wound properly can help to prevent pain, infection, and scarring. It can also help your wound heal more quickly. Follow instructions from your health care provider about how to care for your wound. Supplies needed: Soap and water. Wound cleanser, saline, or germ-free (sterile) water. Gauze. If needed, a clean bandage (dressing) or other type of wound dressing material to cover or place in the wound. Follow your health care provider's instructions about what dressing supplies to use. Cream or topical ointment to apply to the wound, if told by your health care provider. How to care for your wound Cleaning the wound Ask your health care provider how to clean the wound. This may include: Using mild soap and water, a wound cleanser, saline, or sterile water. Using a clean gauze to pat the wound dry after cleaning it. Do not rub or scrub the wound. Dressing care Wash your hands with soap and water for at least 20 seconds before and after you change the dressing. If soap and water are not available, use hand sanitizer. Change your dressing as told by your health care provider. This may include: Cleaning or rinsing out (irrigating) the wound. Application of cream or topical ointment, if told by your health care provider. Placing a dressing over the wound or in the wound (packing). Covering the wound with an outer dressing. Leave stitches (sutures), staples, skin glue, or adhesive strips in place. These skin closures may need to stay in place for 2 weeks or longer. If adhesive strip edges start to loosen and curl up, you may trim the loose edges. Do not remove adhesive strips completely unless your health care provider tells you to do that. Ask your health care provider when you can leave the wound uncovered. Checking for infection Check your wound area every day for signs of infection. Check for: More redness, swelling, or pain. Fluid or blood. Warmth. Pus or  a bad smell.  Follow these instructions at home Medicines If you were prescribed an antibiotic medicine, cream, or ointment, take or apply it as told by your health care provider. Do not stop using the antibiotic even if your condition improves. If you were prescribed pain medicine, take it 30 minutes before you do any wound care or as told by your health care provider. Take over-the-counter and prescription medicines only as told by your health care provider. Eating and drinking Eat a diet that includes protein, vitamin A, vitamin C, and other nutrient-rich foods to help the wound heal. Foods rich in protein include meat, fish, eggs, dairy, beans, and nuts. Foods rich in vitamin A include carrots and dark green, leafy vegetables. Foods rich in vitamin C include citrus fruits, tomatoes, broccoli, and peppers. Drink enough fluid to keep your urine pale yellow. General instructions Do not take baths, swim, or use a hot tub until your health care provider approves. Ask your health care provider if you may take showers. You may only be allowed to take sponge baths. Do not scratch or pick at the wound. Keep it covered as told by your health care provider. Return to your normal activities as told by your health care provider. Ask your health care provider what activities are safe for you. Protect your wound from the sun when you are outside for the first 6 months, or for as long as told by your health care provider. Cover up the scar area or apply sunscreen that has an SPF of at least 55. Do not  use any products that contain nicotine or tobacco. These products include cigarettes, chewing tobacco, and vaping devices, such as e-cigarettes. If you need help quitting, ask your health care provider. Keep all follow-up visits. This is important. Contact a health care provider if: You received a tetanus shot and you have swelling, severe pain, redness, or bleeding at the injection site. Your pain is not  controlled with medicine. You have any of these signs of infection: More redness, swelling, or pain around the wound. Fluid or blood coming from the wound. Warmth coming from the wound. A fever or chills. You are nauseous or you vomit. You are dizzy. You have a new rash or hardness around the wound. Get help right away if: You have a red streak of skin near the area around your wound. Pus or a bad smell coming from the wound. Your wound has been closed with staples, sutures, skin glue, or adhesive strips and it begins to open up and separate. Your wound is bleeding, and the bleeding does not stop with gentle pressure. These symptoms may represent a serious problem that is an emergency. Do not wait to see if the symptoms will go away. Get medical help right away. Call your local emergency services (911 in the [place] Do not drive yourself to the hospital. Summary Always wash your hands with soap and water for at least 20 seconds before and after changing your dressing. Change your dressing as told by your health care provider. To help with healing, eat foods that are rich in protein, vitamin A, vitamin C, and other nutrients. Check your wound every day for signs of infection. Contact your health care provider if you think that your wound is infected. This information is not intended to replace advice given to you by your health care provider. Make sure you discuss any questions you have with your health care provider. Document Revised: 11/12/2020 Document Reviewed: 11/12/2020 Elsevier Patient Education  [employer]. Ankle Pain The ankle joint holds your body weight and allows you to move around. Ankle pain can occur on either side or the back of one ankle or both ankles. Ankle pain may be sharp and burning or dull and aching. There may be tenderness, stiffness, redness, or warmth around the ankle. Many things can cause ankle pain, including an injury to the area and overuse of the  ankle. Follow these instructions at home: Activity Rest your ankle as told by your health care provider. Avoid any activities that cause ankle pain. Do not use the injured limb to support your body weight until your health care provider says that you can. Use crutches as told by your health care provider. Do exercises as told by your health care provider. Ask your health care provider when it is safe to drive if you have a brace on your ankle. If you have a brace: Wear the brace as told by your health care provider. Remove it only as told by your health care provider. Loosen the brace if your toes tingle, become numb, or turn cold and blue. Keep the brace clean. If the brace is not waterproof: Do not let it get wet. Cover it with a watertight covering when you take a bath or shower. If you were given an elastic bandage:  Remove it when you take a bath or a shower. Try not to move your ankle very much, but wiggle your toes from time to time. This helps to prevent swelling. Adjust the bandage to make it  more comfortable if it feels too tight. Loosen the bandage if you have numbness or tingling in your foot or if your foot turns cold and blue. Managing pain, stiffness, and swelling  If directed, put ice on the painful area. If you have a removable brace or elastic bandage, remove it as told by your health care provider. Put ice in a plastic bag. Place a towel between your skin and the bag. Leave the ice on for 20 minutes, 2-3 times a day. Move your toes often to avoid stiffness and to lessen swelling. Raise (elevate) your ankle above the level of your heart while you are sitting or lying down. General instructions Record information about your pain. Writing down the following may be helpful for you and your health care provider: How often you have ankle pain. Where the pain is located. What the pain feels like. If treatment involves wearing a prescribed shoe or insole, make sure you  wear it correctly and for as long as told by your health care provider. Take over-the-counter and prescription medicines only as told by your health care provider. Keep all follow-up visits as told by your health care provider. This is important. Contact a health care provider if: Your pain gets worse. Your pain is not relieved with medicines. You have a fever or chills. You are having more trouble with walking. You have new symptoms. Get help right away if: Your foot, leg, toes, or ankle: Tingles or becomes numb. Becomes swollen. Turns pale or blue. Summary Ankle pain can occur on either side or the back of one ankle or both ankles. Ankle pain may be sharp and burning or dull and aching. Rest your ankle as told by your health care provider. If told, apply ice to the area. Take over-the-counter and prescription medicines only as told by your health care provider. This information is not intended to replace advice given to you by your health care provider. Make sure you discuss any questions you have with your health care provider. Document Revised: 08/29/2020 Document Reviewed: 08/29/2020 Elsevier Patient Education  2022 [employer].

## 2021-08-19 NOTE — Progress Notes (Signed)
X ray shows previous hardware from her past foot surgery which is under area of concern the left middle ankle. Other osteoarthritis of great toe joint seen as well.   No acute fracture seen, given her pain and symptoms, I recommend a follow up with orthopedics- does she need a referral ?   Follow up as needed in office if any symptoms worsen or change by appointment at anytime.

## 2021-08-19 NOTE — Progress Notes (Signed)
Possible post traumatic changes of her fibula, inner leg bone on the side her ankle has pain and swelling, could be degenerative, either wasy she needs to see orthopedic- does she have a preference for orthopedic or she can walk in at [facility] [place] to be seen Monday to Friday the sooner the better. Let me know.

## 2021-08-26 ENCOUNTER — Ambulatory Visit (INDEPENDENT_AMBULATORY_CARE_PROVIDER_SITE_OTHER): Payer: Medicare Other | Admitting: Adult Health

## 2021-08-26 ENCOUNTER — Encounter: Payer: Self-pay | Admitting: Adult Health

## 2021-08-26 ENCOUNTER — Other Ambulatory Visit: Payer: Self-pay

## 2021-08-26 VITALS — BP 132/82 | HR 71 | Temp 98.4°F | Resp 16 | Ht 67.0 in | Wt 125.2 lb

## 2021-08-26 DIAGNOSIS — M79672 Pain in left foot: Secondary | ICD-10-CM

## 2021-08-26 DIAGNOSIS — I739 Peripheral vascular disease, unspecified: Secondary | ICD-10-CM | POA: Diagnosis not present

## 2021-08-26 DIAGNOSIS — M25572 Pain in left ankle and joints of left foot: Secondary | ICD-10-CM | POA: Diagnosis not present

## 2021-08-26 DIAGNOSIS — R9389 Abnormal findings on diagnostic imaging of other specified body structures: Secondary | ICD-10-CM

## 2021-08-26 DIAGNOSIS — G8929 Other chronic pain: Secondary | ICD-10-CM | POA: Diagnosis not present

## 2021-08-26 DIAGNOSIS — M25472 Effusion, left ankle: Secondary | ICD-10-CM | POA: Diagnosis not present

## 2021-08-26 DIAGNOSIS — S91002A Unspecified open wound, left ankle, initial encounter: Secondary | ICD-10-CM

## 2021-08-26 NOTE — Progress Notes (Signed)
Acute Office Visit  Subjective:    Patient ID: [name], female    DOB: 1940/12/11, 81 y.o.   MRN: [id number]  Chief Complaint  Patient presents with   Follow-up    1 wk, medication has been helping, still has discomfort while not being active. Pain only when sitting or lying.    HPI Patient is in today for follow up seen last for pain and swelling of left ankle. X- ray results below for continuity of care.  She has had a closed wound on her medial ankle where her shoe was rubbing. No draining.  She has some paresthesias in foot. She is still on keflex and has had good results. No more drainage.   Previous surgery left foot  Recommended Orthopedics 2007.Dr. Maree Krabbe office.   Randel Pigg [place], [place]  08/21/2021  3:27 PM EST     Informed pt in regards to her x-ray results and also about being referred to orthopedics or going to [facility]. She denied at this time given that she has a f/u appt with on 08/26/21 and would like to discuss with you about it and decide from there.    Doreen Beam, FNP  08/19/2021  8:34 PM EST     X ray shows previous hardware from her past foot surgery which is under area of concern the left middle ankle. Other osteoarthritis of great toe joint seen as well.   No acute fracture seen, given her pain and symptoms, I recommend a follow up with orthopedics- does she need a referral ?    CLINICAL DATA:  Left foot pain and swelling.  Surgery in left foot.   EXAM: LEFT FOOT - COMPLETE 3+ VIEW   COMPARISON:  Left toes radiographs 08/17/2017   FINDINGS: Moderate hallux valgus. Moderate great toe metatarsophalangeal joint space narrowing and peripheral osteophytosis. There is diffuse decreased bone mineralization.   Mild tarsometatarsal joint space narrowing diffusely.   Partial visualization of 2 screws overlying the medial malleolus and distal fibular diaphyseal lateral screw and plate fixation. Small calcaneal heel  spur. IMPRESSION:: IMPRESSION: 1. No acute fracture. 2. Moderate hallux valgus with moderate great toe metatarsophalangeal joint osteoarthritis. Electronically Signed   By: Yvonne Kendall M.D.   On: 08/19/2021 19:33 Patient  denies any fever,chills, rash, chest pain, shortness of breath, nausea, vomiting, or diarrhea.   Past Medical History:  Diagnosis Date   (HFpEF) heart failure with preserved ejection fraction ([place])    a. 08/2017 Echo: EF 55-60%, no rwma, mild MR, mildly dil LA, nl RV fxn; b. 03/2020 Echo: EF 60-65%, no rwma, Gr2 DD. RVSP 44.6mHg. Mod dil LA. Mild MR; c. 09/2020 Echo: EF 50-55%, no rwma, mild LVH, mod red RV fxn, mildly dily RA.   Acute CHF (congestive heart failure) ([facility] 09/18/2020   Alcohol abuse 04/05/2020   Breast cancer ([facility] 2001   left breast   Cancer ([facility] [place]   left breast ca   Carotid arterial disease ([facility]    a. 10/2004 s/p L CEA; b. 12/2015 Carotid U/S: RICA 1-39%; b. LICA patent CEA site.   Closed right hip fracture ([facility] 09/20/2019   GERD (gastroesophageal reflux disease)    Hyperlipidemia    Hypertension    Osteoarthritis, multiple sites    Osteopenia    Persistent atrial fibrillation ([facility]    a. Dx 08/2017; b. CHA2DS2VASc = 6-->Pradaxa; c. 09/2017 Successful DCCV (second shock - 200J); d. 10/2017 Recurrent Afib-->flecainide started 11/2017; e. 06/2020 s/p DCCV (200J  x 1); f. 06/2020 Recurrent AF->Flec inc 75bid; g. 09/2020 WCT->flec d/c'd->amio started.   Personal history of radiation therapy 2001   left breast ca   Psoriasis    Wide-complex tachycardia    a. 09/2020 in setting of presumed Flecainide toxicity.  Flecainide d/c'd.    Past Surgical History:  Procedure Laterality Date   ABDOMINAL HYSTERECTOMY     ANKLE FRACTURE SURGERY  4/08   left---hardware still in place   BREAST BIOPSY Left 2001   breast ca   BREAST EXCISIONAL BIOPSY Left yrs ago   benign   BREAST LUMPECTOMY Left 2001   f/u radiation   CARDIOVERSION N/A 10/04/2017   Procedure:  CARDIOVERSION;  Surgeon: Wellington Hampshire, MD;  Location: [facility];  Service: Cardiovascular;  Laterality: N/A;   CARDIOVERSION N/A 12/16/2017   Procedure: CARDIOVERSION;  Surgeon: [name], MD;  Location: [facility];  Service: Cardiovascular;  Laterality: N/A;   CARDIOVERSION N/A 06/24/2020   Procedure: CARDIOVERSION;  Surgeon: Wellington Hampshire, MD;  Location: [facility];  Service: Cardiovascular;  Laterality: N/A;   CAROTID ENDARTERECTOMY Left 10/23/2004   FRACTURE SURGERY     OOPHORECTOMY     SHOULDER SURGERY  6/07   left   TONSILLECTOMY AND ADENOIDECTOMY     TOTAL HIP ARTHROPLASTY  2004   right    Family History  Problem Relation Age of Onset   Hypertension Brother    Pneumonia Mother    Leukemia Father    Heart disease Neg Hx    Diabetes Neg Hx    Breast cancer Neg Hx     Social History   Socioeconomic History   Marital status: Widowed    Spouse name: Not on file   Number of children: Not on file   Years of education: Not on file   Highest education level: Not on file  Occupational History   Occupation: Marketing    Comment: Retired  Tobacco Use   Smoking status: Former    Packs/day: 1.00    Years: 40.00    Pack years: 40.00    Types: Cigarettes    Quit date: 07/21/1995    Years since quitting: 26.1   Smokeless tobacco: Never  Vaping Use   Vaping Use: Never used  Substance and Sexual Activity   Alcohol use: Yes    Alcohol/week: 2.0 standard drinks    Types: 1 Glasses of wine, 1 Standard drinks or equivalent per week    Comment: 1 glass of wine and 1 drink of liquor daily   Drug use: No   Sexual activity: Never  Other Topics Concern   Not on file  Social History Narrative   1 natural child   3 adopted children   Artist---still teaches water colors   Husband has Alzheimers      Has living will   Daughter Amy is health care POA   DNR    No tube feeds if cognitively unaware   Social Determinants of Health   Financial Resource Strain: Low Risk     Difficulty of Paying Living Expenses: Not hard at all  Food Insecurity: No Food Insecurity   Worried About Charity fundraiser in the Last Year: Never true   Ran Out of Food in the Last Year: Never true  Transportation Needs: No Transportation Needs   Lack of Transportation (Medical): No   Lack of Transportation (Non-Medical): No  Physical Activity: Unknown   Days of Exercise per Week: 0 days   Minutes of  Exercise per Session: Not on file  Stress: No Stress Concern Present   Feeling of Stress : Only a little  Social Connections: Unknown   Frequency of Communication with Friends and Family: More than three times a week   Frequency of Social Gatherings with Friends and Family: More than three times a week   Attends Religious Services: Not on Electrical engineer or Organizations: Not on file   Attends Archivist Meetings: Not on file   Marital Status: Not on file  Intimate Partner Violence: Not At Risk   Fear of Current or Ex-Partner: No   Emotionally Abused: No   Physically Abused: No   Sexually Abused: No    Outpatient Medications Prior to Visit  Medication Sig Dispense Refill   acetaminophen (TYLENOL) 325 MG tablet Take 2 tablets (650 mg total) by mouth every 4 (four) hours as needed for headache or mild pain.     allopurinol (ZYLOPRIM) 100 MG tablet TAKE 1.5 TABLETS (150MG TOTAL) BY MOUTH DAILY 135 tablet 1   amiodarone (PACERONE) 200 MG tablet TAKE 1 TABLET BY MOUTH EVERY DAY 90 tablet 2   amLODipine (NORVASC) 10 MG tablet Take 1 tablet (10 mg total) by mouth daily. 90 tablet 3   cephALEXin (KEFLEX) 500 MG capsule Take 1 capsule (500 mg total) by mouth 3 (three) times daily. 30 capsule 0   cholecalciferol (VITAMIN D) 25 MCG (1000 UNIT) tablet Take 2 tablets (2,000 Units total) by mouth daily.     furosemide (LASIX) 40 MG tablet Take 0.5 tablet (20 mg) by mouth once daily as needed for swelling 45 tablet 2   LORazepam (ATIVAN) 0.5 MG tablet TAKE 1 TABLET BY  MOUTH TWICE A DAY AS NEEDED FOR ANXIETY 60 tablet 0   mirtazapine (REMERON) 15 MG tablet Take 1 tablet (15 mg total) by mouth at bedtime.     Multiple Vitamin (MULTIVITAMIN) capsule Take 1 capsule by mouth daily.     Multiple Vitamins-Minerals (PRESERVISION AREDS 2+MULTI VIT PO) Take 1 capsule by mouth in the morning and at bedtime.     PRADAXA 150 MG CAPS capsule TAKE 1 CAPSULE BY MOUTH TWICE A DAY 180 capsule 3   Chlorhexidine Gluconate Cloth 2 % PADS Apply 6 each topically daily. (Patient not taking: Reported on 07/31/2021)     No facility-administered medications prior to visit.    No Known Allergies  Review of Systems  Constitutional: Negative.   HENT: Negative.    Respiratory: Negative.    Cardiovascular: Negative.   Gastrointestinal: Negative.   Genitourinary: Negative.   Musculoskeletal: Negative.   Skin:  Negative for color change, pallor, rash and wound.  Neurological: Negative.   Psychiatric/Behavioral: Negative.        Objective:   Physical Exam  General: Appearance:    Thin female in no acute distress  Eyes:    PERRL, conjunctiva/corneas clear, EOM's intact       Lungs:     Clear to auscultation bilaterally, respirations unlabored  Heart:    Normal heart rate. Regular rhythm. No murmurs, rubs, or gallops.   MS:   All extremities are intact.    Neurologic:   Awake, alert, oriented x 3. No apparent focal neurological           defect.    Abdomen normal to inspection. Bowel sounds normoactive throughout all quadrants. No gross deformity. No rash. No ecchymosis. Normoactive bowel sounds active all four quadrants. No pulsatile aorta or mass visualized.  No diffuse tenderness with palpation. No guarding or facial grimace.  No rigidity. No rebound tenderness. No palpable mass. No organomegaly. No CVA tenderness with percussion.  Left lateral ankle with callus, previous scars from previous foot surgeries.Has PVD,  iron staining bilateral lower extremities.   BP 132/82 (BP  Location: Left Arm, Patient Position: Sitting, Cuff Size: Small)    Pulse 71    Temp 98.4 F (36.9 C) (Oral)    Resp 16    Ht _0  (1.702 m)    Wt 125 lb 3.2 oz (56.8 kg)    SpO2 99%    BMI 19.61 kg/m  Wt Readings from Last 3 Encounters:  08/26/21 125 lb 3.2 oz (56.8 kg)  08/19/21 123 lb (55.8 kg)  07/31/21 123 lb (55.8 kg)    Health Maintenance Due  Topic Date Due   TETANUS/TDAP  03/07/2021    There are no preventive care reminders to display for this patient.   Lab Results  Component Value Date   TSH 2.260 07/31/2021   Lab Results  Component Value Date   WBC 6.1 07/31/2021   HGB 11.7 07/31/2021   HCT 33.9 (L) 07/31/2021   MCV 102 (H) 07/31/2021   PLT 280 07/31/2021   Lab Results  Component Value Date   NA 145 (H) 08/11/2021   K 4.6 08/11/2021   CO2 26 08/11/2021   GLUCOSE 105 (H) 08/11/2021   BUN 28 (H) 08/11/2021   CREATININE 1.43 (H) 08/11/2021   BILITOT 0.3 07/31/2021   ALKPHOS 90 07/31/2021   AST 30 07/31/2021   ALT 19 07/31/2021   PROT 6.7 07/31/2021   ALBUMIN 4.1 07/31/2021   CALCIUM 9.9 08/11/2021   ANIONGAP 11 10/14/2020   EGFR 37 (L) 08/11/2021   GFR 48.06 (L) 01/15/2021   Lab Results  Component Value Date   CHOL 216 (H) 01/11/2019   Lab Results  Component Value Date   HDL 88.90 01/11/2019   Lab Results  Component Value Date   LDLCALC 104 (H) 01/11/2019   Lab Results  Component Value Date   TRIG 116.0 01/11/2019   Lab Results  Component Value Date   CHOLHDL 2 01/11/2019   Lab Results  Component Value Date   HGBA1C 5.3 12/18/2014       Assessment & Plan:   Problem List Items Addressed This Visit       Other   Pain and swelling of left ankle   Relevant Orders   US Venous Img Lower Unilateral Left (DVT)   Ambulatory referral to Vascular Surgery   Ambulatory referral to Orthopedic Surgery   Wound of left ankle   Relevant Orders   US Venous Img Lower Unilateral Left (DVT)   Ambulatory referral to Vascular Surgery    Ambulatory referral to Orthopedic Surgery   Other Visit Diagnoses     PVD (peripheral vascular disease) ([place])    -  Primary   Relevant Orders   US Venous Img Lower Unilateral Left (DVT)   Ambulatory referral to Vascular Surgery   Ambulatory referral to Orthopedic Surgery   Chronic pain of left ankle       Relevant Orders   US Venous Img Lower Unilateral Left (DVT)   Ambulatory referral to Vascular Surgery   Ambulatory referral to Orthopedic Surgery   Left foot pain       Relevant Orders   US Venous Img Lower Unilateral Left (DVT)   Ambulatory referral to Vascular Surgery   Ambulatory referral to Orthopedic Surgery  Abnormal x-ray       Relevant Orders   Ambulatory referral to Vascular Surgery   Ambulatory referral to Orthopedic Surgery     Referral back to orthopedics.   Finish keflex, left lateral ankle much improved. PVD, will refer to [facility].   Red Flags discussed. The patient was given clear instructions to go to ER or return to medical center if any red flags develop, symptoms do not improve, worsen or new problems develop. They verbalized understanding.   Return in about 1 month (around 09/23/2021), or if symptoms worsen or fail to improve, for at any time for any worsening symptoms, Go to Emergency room/ urgent care if worse.    No orders of the defined types were placed in this encounter.    Marcille Buffy, FNP

## 2021-08-26 NOTE — Patient Instructions (Addendum)
Orders Placed This Encounter  Procedures   US Venous Img Lower Unilateral Left (DVT)    Order Specific Question:   Reason for Exam (SYMPTOM  OR DIAGNOSIS REQUIRED)    Answer:   left leg poor circulation, history of wound, and rule out DVT.    Order Specific Question:   Preferred imaging location?    Answer:   [facility]   Ambulatory referral to Vascular Surgery    Referral Priority:   Routine    Referral Type:   Surgical    Referral Reason:   Specialty Services Required    Referred to Provider:   Algernon Huxley, MD    Requested Specialty:   Vascular Surgery    Number of Visits Requested:   1   Ambulatory referral to Orthopedic Surgery    Referral Type:   Surgical    Referral Reason:   Specialty Services Required    Referred to Provider:   Dereck Leep, MD    Requested Specialty:   Orthopedic Surgery    Number of Visits Requested:   1   Please finish Keflex.    Foot Pain Many things can cause foot pain. Some common causes are: An injury. A sprain. Arthritis. Blisters. Bunions. Follow these instructions at home: Managing pain, stiffness, and swelling If directed, put ice on the painful area: Put ice in a plastic bag. Place a towel between your skin and the bag. Leave the ice on for 20 minutes, 2-3 times a day.  Activity Do not stand or walk for long periods. Return to your normal activities as told by your health care provider. Ask your health care provider what activities are safe for you. Do stretches to relieve foot pain and stiffness as told by your health care provider. Do not lift anything that is heavier than 10 lb (4.5 kg), or the limit that you are told, until your health care provider says that it is safe. Lifting a lot of weight can put added pressure on your feet. Lifestyle Wear comfortable, supportive shoes that fit you well. Do not wear high heels. Keep your feet clean and dry. General instructions Take over-the-counter and prescription medicines only  as told by your health care provider. Rub your foot gently. Pay attention to any changes in your symptoms. Keep all follow-up visits as told by your health care provider. This is important. Contact a health care provider if: Your pain does not get better after a few days of self-care. Your pain gets worse. You cannot stand on your foot. Get help right away if: Your foot is numb or tingling. Your foot or toes are swollen. Your foot or toes turn white or blue. You have warmth and redness along your foot. Summary Common causes of foot pain are injury, sprain, arthritis, blisters, or bunions. Ice, medicines, and comfortable shoes may help foot pain. Contact your health care provider if your pain does not get better after a few days of self-care. This information is not intended to replace advice given to you by your health care provider. Make sure you discuss any questions you have with your health care provider. Document Revised: 10/09/2020 Document Reviewed: 10/09/2020 Elsevier Patient Education  [place].

## 2021-08-27 ENCOUNTER — Telehealth: Payer: Self-pay

## 2021-08-27 NOTE — Telephone Encounter (Signed)
Incoming fax from [employer]   They provided the patient with a temporary 30 day supply of Pradaxa 150mg .   "This drug is either not included on our list of covered drugs or its included on the formulary but subject to certain limits.

## 2021-08-28 ENCOUNTER — Encounter: Payer: Self-pay | Admitting: Adult Health

## 2021-08-28 NOTE — Telephone Encounter (Signed)
I called and spoke with the patient. I advised her that we had received a fax yesterday stating that [facility] is providing her with a temporary supply of Pradaxa 150 mg capsules.  Date filled: 08/26/21 Reason for notification: This drug is not on our formulary. We will not continue to pay for this drug after you have received the maximum 30 days' temporary supply that we are required to cover unless you obtain a formulary exception from [place].   The patient states she was advised by her pharmacy the last time her pradaxa was filled, is that her insurance would continue to fill this if we provided documentation to support this.  I have advised the patient I will reach out to our pharmacy staff for further guidance as the only reason she was on Pradaxa was due to cost.  She has previously been on Xarelto and switched due to cost.   The patient states she was under the impression that her current RX that is ready at the pharmacy is for 3 month supply of Pradaxa that will typically cost her $500.   She will go pick up her RX today to confirm what supply they are giving her.   She is aware I will call her back once I can reach out to the pharmacy staff as well. She was appreciative of the call back.

## 2021-08-28 NOTE — Telephone Encounter (Signed)
If the only reason she was on Pradaxa was because of cost, she should call her insurance and ask what the preferred medication would be (either Eliquis, Xarelto or both). Now that it isnt preferred, one of the others should be more affordable.

## 2021-09-02 ENCOUNTER — Telehealth: Payer: Self-pay

## 2021-09-02 NOTE — Telephone Encounter (Signed)
Attempted to call the patient. No answer- I left her a detailed message on her identified voice mail of Pharm D recommendations as stated below.  I advised I have also tried to research online what the preferred med is for her drug plan, but from what I can tell, the Xarelto and Eliquis are both at possibly a tier 3.  I have advised her that I cannot find an actual cost for either drug online, so advised her she will need to please reach out to her insurance company for an actual cost for either the Xarelto or Eliquis.  I have asked her to please call me back once she can clarify with the insurance.

## 2021-09-02 NOTE — Telephone Encounter (Signed)
Patient calling to see if you found out about any medication for her -- she said you both spoke last week.  Please call.

## 2021-09-02 NOTE — Telephone Encounter (Signed)
See 08/28/21 phone note.

## 2021-09-03 ENCOUNTER — Other Ambulatory Visit: Payer: Self-pay

## 2021-09-03 ENCOUNTER — Ambulatory Visit
Admission: RE | Admit: 2021-09-03 | Discharge: 2021-09-03 | Disposition: A | Discharge status: Home / Self Care | Payer: Medicare Other | Source: Ambulatory Visit | Attending: Adult Health | Admitting: Adult Health

## 2021-09-03 ENCOUNTER — Ambulatory Visit: Payer: Medicare Other

## 2021-09-03 DIAGNOSIS — M25572 Pain in left ankle and joints of left foot: Secondary | ICD-10-CM | POA: Diagnosis not present

## 2021-09-03 DIAGNOSIS — G8929 Other chronic pain: Secondary | ICD-10-CM | POA: Insufficient documentation

## 2021-09-03 DIAGNOSIS — S91002A Unspecified open wound, left ankle, initial encounter: Secondary | ICD-10-CM | POA: Insufficient documentation

## 2021-09-03 DIAGNOSIS — M79672 Pain in left foot: Secondary | ICD-10-CM | POA: Diagnosis not present

## 2021-09-03 DIAGNOSIS — I739 Peripheral vascular disease, unspecified: Secondary | ICD-10-CM | POA: Insufficient documentation

## 2021-09-03 DIAGNOSIS — M25472 Effusion, left ankle: Secondary | ICD-10-CM | POA: Diagnosis not present

## 2021-09-03 DIAGNOSIS — S81802A Unspecified open wound, left lower leg, initial encounter: Secondary | ICD-10-CM | POA: Diagnosis not present

## 2021-09-03 NOTE — Progress Notes (Signed)
No lower extremity DVT blood clot.

## 2021-09-04 ENCOUNTER — Telehealth: Payer: Self-pay

## 2021-09-04 NOTE — Telephone Encounter (Signed)
Informed pt via vm that her [place] came back negative for blood clot.

## 2021-09-04 NOTE — Telephone Encounter (Signed)
See 2/9 phone note  

## 2021-09-04 NOTE — Telephone Encounter (Signed)
The patient called back to the office today stating she spoke with her insurance company and they preferred Eliquis. They have quoted her $102.95/ month, she felt ok with. She advised this was certainly a lower cost than Pradaxa.  The patient is aware I will get an ok from Dr. Caryl Comes to send her RX in, but It looks like she will end up being on Eliquis 2.5 mg BID.  The patient is aware I will send this to the pharmacy once approved by Dr. Caryl Comes. She is agreeable.  Ms. Mee confirms she has enough Pradaxa to last to the middle of next month.   To Dr. Caryl Comes to review:  Age: 81 Weight: 56.8 kg Serum creatinine: 1.43 (08/11/21)   = Eliquis 2.5 mg BID

## 2021-09-04 NOTE — Telephone Encounter (Signed)
Patient calling back with information regarding prescription, please assist

## 2021-09-09 DIAGNOSIS — M25572 Pain in left ankle and joints of left foot: Secondary | ICD-10-CM | POA: Diagnosis not present

## 2021-09-09 DIAGNOSIS — Z969 Presence of functional implant, unspecified: Secondary | ICD-10-CM | POA: Diagnosis not present

## 2021-09-09 MED ORDER — APIXABAN 2.5 MG PO TABS: 2.5000 mg | ORAL_TABLET | Freq: Two times a day (BID) | ORAL | 6 refills | Status: DC

## 2021-09-09 NOTE — Telephone Encounter (Signed)
Reviewed Eliquis dosing with Dr. Caryl Comes. MD confirms the patient will be on Eliquis 2.5 mg BID.  RX sent to the pharmacy.  I have called the patient and notified her of the above. She is aware she may finish her current supply of Pradaxa and then after she take her last dose, the following day she may start Eliquis 2.5 mg BID in place of the Pradaxa.   The patient voices understanding and is agreeable.

## 2021-09-10 ENCOUNTER — Telehealth: Payer: Self-pay | Admitting: Internal Medicine

## 2021-09-10 NOTE — Telephone Encounter (Signed)
Patient sts that the LE swelling is new and started a few weeks ago.  Patient denies sob. Pt was seen by Dr. Marry Guan yesterday for the LE swelling and left ankle pain. Pt sts that there is an area of her ankle that is infected, she has a plate and pins in that ankle.  Dr. Marry Guan instructed her to take Lasix 20 mg daily instead of just prn for 7-10 days, elevate her legs and apply cold compresses to the area. She is also on Keflex. She has a f/u appt  with Dr. Marry Guan in a week. Pt sts that she was just wanting Dr. Olin Pia recommendation regarding Lasix.  Patient made aware of Dr. Olin Pia response and recommendation. Patient verbalized understanding and voiced appreciation for the call.

## 2021-09-10 NOTE — Telephone Encounter (Signed)
Patient says Dr Marry Guan is recommending she start lasix medication Would like to know Dr Olin Pia opinion  Please call to discuss

## 2021-09-15 DIAGNOSIS — Z20822 Contact with and (suspected) exposure to covid-19: Secondary | ICD-10-CM | POA: Diagnosis not present

## 2021-09-16 DIAGNOSIS — H43813 Vitreous degeneration, bilateral: Secondary | ICD-10-CM | POA: Diagnosis not present

## 2021-09-18 ENCOUNTER — Other Ambulatory Visit: Payer: Self-pay | Admitting: Family Medicine

## 2021-09-18 DIAGNOSIS — F419 Anxiety disorder, unspecified: Secondary | ICD-10-CM

## 2021-09-24 DIAGNOSIS — Z20828 Contact with and (suspected) exposure to other viral communicable diseases: Secondary | ICD-10-CM | POA: Diagnosis not present

## 2021-09-25 ENCOUNTER — Other Ambulatory Visit: Payer: Self-pay

## 2021-09-25 ENCOUNTER — Ambulatory Visit (INDEPENDENT_AMBULATORY_CARE_PROVIDER_SITE_OTHER): Payer: Medicare Other | Admitting: Podiatry

## 2021-09-25 ENCOUNTER — Encounter: Payer: Self-pay | Admitting: Podiatry

## 2021-09-25 DIAGNOSIS — B351 Tinea unguium: Secondary | ICD-10-CM

## 2021-09-25 DIAGNOSIS — I872 Venous insufficiency (chronic) (peripheral): Secondary | ICD-10-CM

## 2021-09-25 DIAGNOSIS — D689 Coagulation defect, unspecified: Secondary | ICD-10-CM | POA: Diagnosis not present

## 2021-09-25 DIAGNOSIS — M79676 Pain in unspecified toe(s): Secondary | ICD-10-CM

## 2021-09-25 NOTE — Progress Notes (Signed)
This patient returns to my office for at risk foot care.  This patient requires this care by a professional since this patient will be at risk due to having lymphedema.  This patient is unable to cut nails herself since the patient cannot reach her nails.These nails are painful walking and wearing shoes.  This patient presents for at risk foot care today.  General Appearance  Alert, conversant and in no acute stress.  Vascular  Dorsalis pedis and posterior tibial  pulses are palpable  bilaterally.  Capillary return is within normal limits  bilaterally. Temperature is within normal limits  bilaterally.  Neurologic  Senn-Weinstein monofilament wire test within normal limits  bilaterally. Muscle power within normal limits bilaterally.  Nails Thick disfigured discolored nails with subungual debris  from hallux to fifth toes bilaterally. No evidence of bacterial infection or drainage bilaterally.  Orthopedic  No limitations of motion  feet .  No crepitus or effusions noted.  No bony pathology or digital deformities noted.  HAV  B/L.  Mallet toe third toe left foot.  Skin  normotropic skin with no porokeratosis noted bilaterally.  No signs of infections or ulcers noted.     Onychomycosis  Pain in right toes  Pain in left toes  Consent was obtained for treatment procedures.   Mechanical debridement of nails 1-5  bilaterally performed with a nail nipper.  Filed with dremel without incident.     Return office visit    3 months                  Told patient to return for periodic foot care and evaluation due to potential at risk complications.   Tamiyah Moulin DPM  

## 2021-10-21 DIAGNOSIS — Z20822 Contact with and (suspected) exposure to covid-19: Secondary | ICD-10-CM | POA: Diagnosis not present

## 2021-10-23 DIAGNOSIS — Z20822 Contact with and (suspected) exposure to covid-19: Secondary | ICD-10-CM | POA: Diagnosis not present

## 2021-10-29 ENCOUNTER — Other Ambulatory Visit (INDEPENDENT_AMBULATORY_CARE_PROVIDER_SITE_OTHER): Payer: Medicare Other

## 2021-10-29 DIAGNOSIS — I10 Essential (primary) hypertension: Secondary | ICD-10-CM

## 2021-10-29 DIAGNOSIS — D472 Monoclonal gammopathy: Secondary | ICD-10-CM

## 2021-10-29 LAB — CBC WITH DIFFERENTIAL/PLATELET
Basophils Absolute: 0.1 10*3/uL (ref 0.0–0.1)
Basophils Relative: 1.4 % (ref 0.0–3.0)
Eosinophils Absolute: 0.2 10*3/uL (ref 0.0–0.7)
Eosinophils Relative: 4.1 % (ref 0.0–5.0)
HCT: 35.6 % — ABNORMAL LOW (ref 36.0–46.0)
Hemoglobin: 11.6 g/dL — ABNORMAL LOW (ref 12.0–15.0)
Lymphocytes Relative: 29.2 % (ref 12.0–46.0)
Lymphs Abs: 1.5 10*3/uL (ref 0.7–4.0)
MCHC: 32.6 g/dL (ref 30.0–36.0)
MCV: 105.4 fl — ABNORMAL HIGH (ref 78.0–100.0)
Monocytes Absolute: 0.7 10*3/uL (ref 0.1–1.0)
Monocytes Relative: 12.8 % — ABNORMAL HIGH (ref 3.0–12.0)
Neutro Abs: 2.7 10*3/uL (ref 1.4–7.7)
Neutrophils Relative %: 52.5 % (ref 43.0–77.0)
Platelets: 267 10*3/uL (ref 150.0–400.0)
RBC: 3.38 Mil/uL — ABNORMAL LOW (ref 3.87–5.11)
RDW: 14.5 % (ref 11.5–15.5)
WBC: 5.1 10*3/uL (ref 4.0–10.5)

## 2021-10-29 LAB — COMPREHENSIVE METABOLIC PANEL
ALT: 27 U/L (ref 0–35)
AST: 37 U/L (ref 0–37)
Albumin: 4.3 g/dL (ref 3.5–5.2)
Alkaline Phosphatase: 84 U/L (ref 39–117)
BUN: 16 mg/dL (ref 6–23)
CO2: 29 mEq/L (ref 19–32)
Calcium: 9.5 mg/dL (ref 8.4–10.5)
Chloride: 100 mEq/L (ref 96–112)
Creatinine, Ser: 1.07 mg/dL (ref 0.40–1.20)
GFR: 48.34 mL/min — ABNORMAL LOW (ref >60.00)
Glucose, Bld: 88 mg/dL (ref 70–99)
Potassium: 4.1 mEq/L (ref 3.5–5.1)
Sodium: 140 mEq/L (ref 135–145)
Total Bilirubin: 0.5 mg/dL (ref 0.2–1.2)
Total Protein: 7.1 g/dL (ref 6.0–8.3)

## 2021-10-31 ENCOUNTER — Ambulatory Visit (INDEPENDENT_AMBULATORY_CARE_PROVIDER_SITE_OTHER): Payer: Medicare Other | Admitting: Family Medicine

## 2021-10-31 ENCOUNTER — Encounter: Payer: Self-pay | Admitting: Family Medicine

## 2021-10-31 VITALS — BP 115/80 | HR 73 | Temp 98.5°F | Ht 67.0 in | Wt 123.6 lb

## 2021-10-31 DIAGNOSIS — I10 Essential (primary) hypertension: Secondary | ICD-10-CM | POA: Diagnosis not present

## 2021-10-31 DIAGNOSIS — F320 Major depressive disorder, single episode, mild: Secondary | ICD-10-CM | POA: Diagnosis not present

## 2021-10-31 DIAGNOSIS — S91002D Unspecified open wound, left ankle, subsequent encounter: Secondary | ICD-10-CM

## 2021-10-31 MED ORDER — DOXYCYCLINE HYCLATE 100 MG PO TABS: 100.0000 mg | ORAL_TABLET | Freq: Two times a day (BID) | ORAL | 0 refills | Status: DC

## 2021-10-31 NOTE — Assessment & Plan Note (Signed)
Adequately controlled.  She will continue amlodipine 10 mg once daily. ?

## 2021-10-31 NOTE — Assessment & Plan Note (Signed)
Patient possibly has some continued infection in this area.  We will start her on doxycycline 100 mg twice daily.  Discussed the risk of diarrhea and skin sensitivity with this antibiotic.  She will wear good sun protection if she goes outside.  I will also refer her to wound care given that this continues to be slowly healing.  CMA will contact the patient early next week to see how she is doing. ?

## 2021-10-31 NOTE — Patient Instructions (Signed)
Nice to see you. ?Wound care should contact you to schedule an appointment. ?Please start the doxycycline to help with any residual infection.  If you have diarrhea while on the doxycycline please let us know.  Please protect your skin given the risk of skin sensitivity to the sun while on doxycycline. ?

## 2021-10-31 NOTE — Assessment & Plan Note (Signed)
Asymptomatic.  She will continue Remeron 15 mg daily. ?

## 2021-10-31 NOTE — Progress Notes (Signed)
?[name], MD ?Phone: [phone] ? ?[name] is a 81 y.o. female who presents today for f/u. ? ?HYPERTENSION ?Disease Monitoring ?Home BP Monitoring not checking Chest pain- no    Dyspnea- no change to chronic dyspnea ?Medications ?Compliance-  taking amlodipine.   Edema- goes down over night, no orthopnea, no PND ?BMET ?   ?Component Value Date/Time  ? NA 140 10/29/2021 0837  ? NA 145 (H) 08/11/2021 1029  ? NA 139 05/01/2013 1605  ? K 4.1 10/29/2021 0837  ? K 4.2 05/01/2013 1605  ? CL 100 10/29/2021 0837  ? CL 105 05/01/2013 1605  ? CO2 29 10/29/2021 0837  ? CO2 25 05/01/2013 1605  ? GLUCOSE 88 10/29/2021 0837  ? GLUCOSE 89 05/01/2013 1605  ? BUN 16 10/29/2021 0837  ? BUN 28 (H) 08/11/2021 1029  ? BUN 17 05/01/2013 1605  ? CREATININE 1.07 10/29/2021 0837  ? CREATININE 0.88 05/01/2013 1605  ? CALCIUM 9.5 10/29/2021 0837  ? CALCIUM 9.2 05/01/2013 1605  ? GFRNONAA 42 (L) 10/14/2020 1156  ? GFRNONAA >60 05/01/2013 1605  ? GFRAA 59 (L) 06/20/2020 1021  ? GFRAA >60 05/01/2013 1605  ? ?Anxiety/depression: Patient notes no significant symptoms.  She is on Remeron which has been helpful.  She notes no SI. ? ?Left ankle wound: This has been present for some time now.  She saw one of our nurse practitioners previously in January and February who treated her with Keflex for an infection.  She underwent x-ray imaging of her left foot and tibia/fibula that were negative for an acute issue.  She also had a venous ultrasound to evaluate for DVT.  This was negative.  She notes the area did improve some with the Keflex though she apparently has some residual erythema and it still continues to ooze.  She notes she has a hard time seeing that part of her leg so does not know if the redness has been spreading. ? ?Social History  ? ?Tobacco Use  ?Smoking Status Former  ? Packs/day: 1.00  ? Years: 40.00  ? Pack years: 40.00  ? Types: Cigarettes  ? Quit date: 07/21/1995  ? Years since quitting: 26.2  ?Smokeless Tobacco  Never  ? ? ?Current Outpatient Medications on File Prior to Visit  ?Medication Sig Dispense Refill  ? acetaminophen (TYLENOL) 325 MG tablet Take 2 tablets (650 mg total) by mouth every 4 (four) hours as needed for headache or mild pain.    ? allopurinol (ZYLOPRIM) 100 MG tablet TAKE 1.5 TABLETS ('150MG'$  TOTAL) BY MOUTH DAILY 135 tablet 1  ? amiodarone (PACERONE) 200 MG tablet TAKE 1 TABLET BY MOUTH EVERY DAY 90 tablet 2  ? amLODipine (NORVASC) 10 MG tablet Take 1 tablet (10 mg total) by mouth daily. 90 tablet 3  ? apixaban (ELIQUIS) 2.5 MG TABS tablet Take 1 tablet (2.5 mg total) by mouth 2 (two) times daily. 60 tablet 6  ? cholecalciferol (VITAMIN D) 25 MCG (1000 UNIT) tablet Take 2 tablets (2,000 Units total) by mouth daily.    ? furosemide (LASIX) 40 MG tablet Take 0.5 tablet (20 mg) by mouth once daily as needed for swelling 45 tablet 2  ? LORazepam (ATIVAN) 0.5 MG tablet TAKE 1 TABLET BY MOUTH TWICE A DAY AS NEEDED FOR ANXIETY 60 tablet 0  ? mirtazapine (REMERON) 15 MG tablet TAKE 1 TABLET BY MOUTH EVERYDAY AT BEDTIME 90 tablet 3  ? Multiple Vitamin (MULTIVITAMIN) capsule Take 1 capsule by mouth daily.    ?  Multiple Vitamins-Minerals (PRESERVISION AREDS 2+MULTI VIT PO) Take 1 capsule by mouth in the morning and at bedtime.    ? ?No current facility-administered medications on file prior to visit.  ? ? ? ?ROS see history of present illness ? ?Objective ? ?Physical Exam ?Vitals:  ? 10/31/21 0936  ?BP: 115/80  ?Pulse: 73  ?Temp: 98.5 ?F (36.9 ?C)  ?SpO2: 99%  ? ? ?BP Readings from Last 3 Encounters:  ?10/31/21 115/80  ?08/26/21 132/82  ?08/19/21 116/80  ? ?Wt Readings from Last 3 Encounters:  ?10/31/21 123 lb 9.6 oz (56.1 kg)  ?08/26/21 125 lb 3.2 oz (56.8 kg)  ?08/19/21 123 lb (55.8 kg)  ? ? ?Physical Exam ?Constitutional:   ?   General: She is not in acute distress. ?   Appearance: She is not diaphoretic.  ?Cardiovascular:  ?   Rate and Rhythm: Normal rate and regular rhythm.  ?   Heart sounds: Normal heart  sounds.  ?Pulmonary:  ?   Effort: Pulmonary effort is normal.  ?   Breath sounds: Normal breath sounds.  ?Musculoskeletal:  ?     Feet: ? ?Skin: ?   General: Skin is warm and dry.  ?Neurological:  ?   Mental Status: She is alert.  ? ? ? ?Assessment/Plan: Please see individual problem list. ? ?Problem List Items Addressed This Visit   ? ? Depression, major, single episode, mild (HCC) (Chronic)  ?  Asymptomatic.  She will continue Remeron 15 mg daily. ? ?  ?  ? HTN (hypertension) (Chronic)  ?  Adequately controlled.  She will continue amlodipine 10 mg once daily. ? ?  ?  ? Ankle wound, left, subsequent encounter - Primary  ?  Patient possibly has some continued infection in this area.  We will start her on doxycycline 100 mg twice daily.  Discussed the risk of diarrhea and skin sensitivity with this antibiotic.  She will wear good sun protection if she goes outside.  I will also refer her to wound care given that this continues to be slowly healing.  CMA will contact the patient early next week to see how she is doing. ? ?  ?  ? Relevant Orders  ? Ambulatory referral to Wound Clinic  ? ? ?Return in about 4 months (around 03/02/2022). ? ?This visit occurred during the SARS-CoV-2 public health emergency.  Safety protocols were in place, including screening questions prior to the visit, additional usage of staff PPE, and extensive cleaning of exam room while observing appropriate contact time as indicated for disinfecting solutions.  ? ? ?[name], MD ?[place] ? ?

## 2021-11-03 LAB — PROTEIN ELECTROPHORESIS, SERUM
Abnormal Protein Band1: 1 g/dL — ABNORMAL HIGH
Albumin ELP: 4.2 g/dL (ref 3.8–4.8)
Alpha 1: 0.5 g/dL — ABNORMAL HIGH (ref 0.2–0.3)
Alpha 2: 0.7 g/dL (ref 0.5–0.9)
Beta 2: 0.4 g/dL (ref 0.2–0.5)
Beta Globulin: 0.5 g/dL (ref 0.4–0.6)
Gamma Globulin: 1.2 g/dL (ref 0.8–1.7)
Total Protein: 7.4 g/dL (ref 6.1–8.1)

## 2021-11-05 ENCOUNTER — Encounter: Payer: Self-pay | Admitting: Internal Medicine

## 2021-11-05 ENCOUNTER — Other Ambulatory Visit: Payer: Self-pay | Admitting: Family Medicine

## 2021-11-05 ENCOUNTER — Emergency Department: Payer: Medicare Other

## 2021-11-05 ENCOUNTER — Telehealth: Payer: Self-pay | Admitting: Internal Medicine

## 2021-11-05 ENCOUNTER — Observation Stay
Admission: EM | Admit: 2021-11-05 | Discharge: 2021-11-06 | Disposition: A | Discharge status: Home / Self Care | Payer: Medicare Other | Attending: Internal Medicine | Admitting: Internal Medicine

## 2021-11-05 ENCOUNTER — Other Ambulatory Visit: Payer: Self-pay

## 2021-11-05 DIAGNOSIS — I130 Hypertensive heart and chronic kidney disease with heart failure and stage 1 through stage 4 chronic kidney disease, or unspecified chronic kidney disease: Secondary | ICD-10-CM | POA: Insufficient documentation

## 2021-11-05 DIAGNOSIS — I10 Essential (primary) hypertension: Secondary | ICD-10-CM | POA: Diagnosis not present

## 2021-11-05 DIAGNOSIS — R000 Tachycardia, unspecified: Secondary | ICD-10-CM | POA: Diagnosis not present

## 2021-11-05 DIAGNOSIS — Z87891 Personal history of nicotine dependence: Secondary | ICD-10-CM | POA: Diagnosis not present

## 2021-11-05 DIAGNOSIS — D472 Monoclonal gammopathy: Secondary | ICD-10-CM

## 2021-11-05 DIAGNOSIS — Z7901 Long term (current) use of anticoagulants: Secondary | ICD-10-CM | POA: Diagnosis not present

## 2021-11-05 DIAGNOSIS — F418 Other specified anxiety disorders: Secondary | ICD-10-CM | POA: Diagnosis not present

## 2021-11-05 DIAGNOSIS — M109 Gout, unspecified: Secondary | ICD-10-CM | POA: Diagnosis present

## 2021-11-05 DIAGNOSIS — R531 Weakness: Secondary | ICD-10-CM | POA: Diagnosis not present

## 2021-11-05 DIAGNOSIS — I5032 Chronic diastolic (congestive) heart failure: Secondary | ICD-10-CM | POA: Diagnosis not present

## 2021-11-05 DIAGNOSIS — Z789 Other specified health status: Secondary | ICD-10-CM | POA: Diagnosis present

## 2021-11-05 DIAGNOSIS — I482 Chronic atrial fibrillation, unspecified: Secondary | ICD-10-CM | POA: Diagnosis not present

## 2021-11-05 DIAGNOSIS — N1831 Chronic kidney disease, stage 3a: Secondary | ICD-10-CM | POA: Diagnosis not present

## 2021-11-05 DIAGNOSIS — I4891 Unspecified atrial fibrillation: Secondary | ICD-10-CM | POA: Diagnosis not present

## 2021-11-05 DIAGNOSIS — L02426 Furuncle of left lower limb: Secondary | ICD-10-CM | POA: Insufficient documentation

## 2021-11-05 DIAGNOSIS — Z79899 Other long term (current) drug therapy: Secondary | ICD-10-CM | POA: Diagnosis not present

## 2021-11-05 DIAGNOSIS — F1090 Alcohol use, unspecified, uncomplicated: Secondary | ICD-10-CM | POA: Diagnosis present

## 2021-11-05 DIAGNOSIS — Z853 Personal history of malignant neoplasm of breast: Secondary | ICD-10-CM | POA: Diagnosis not present

## 2021-11-05 DIAGNOSIS — L02429 Furuncle of limb, unspecified: Secondary | ICD-10-CM | POA: Diagnosis present

## 2021-11-05 LAB — HEPATIC FUNCTION PANEL
ALT: 42 U/L (ref 0–44)
AST: 77 U/L — ABNORMAL HIGH (ref 15–41)
Albumin: 4.1 g/dL (ref 3.5–5.0)
Alkaline Phosphatase: 88 U/L (ref 38–126)
Bilirubin, Direct: 0.1 mg/dL (ref 0.0–0.2)
Indirect Bilirubin: 0.8 mg/dL (ref 0.3–0.9)
Total Bilirubin: 0.9 mg/dL (ref 0.3–1.2)
Total Protein: 7.5 g/dL (ref 6.5–8.1)

## 2021-11-05 LAB — BASIC METABOLIC PANEL
Anion gap: 16 — ABNORMAL HIGH (ref 5–15)
BUN: 31 mg/dL — ABNORMAL HIGH (ref 8–23)
CO2: 23 mmol/L (ref 22–32)
Calcium: 10 mg/dL (ref 8.9–10.3)
Chloride: 100 mmol/L (ref 98–111)
Creatinine, Ser: 1.09 mg/dL — ABNORMAL HIGH (ref 0.44–1.00)
GFR, Estimated: 51 mL/min — ABNORMAL LOW (ref >60)
Glucose, Bld: 109 mg/dL — ABNORMAL HIGH (ref 70–99)
Potassium: 4 mmol/L (ref 3.5–5.1)
Sodium: 139 mmol/L (ref 135–145)

## 2021-11-05 LAB — CBC
HCT: 38.1 % (ref 36.0–46.0)
Hemoglobin: 12.7 g/dL (ref 12.0–15.0)
MCH: 33.5 pg (ref 26.0–34.0)
MCHC: 33.3 g/dL (ref 30.0–36.0)
MCV: 100.5 fL — ABNORMAL HIGH (ref 80.0–100.0)
Platelets: 287 10*3/uL (ref 150–400)
RBC: 3.79 MIL/uL — ABNORMAL LOW (ref 3.87–5.11)
RDW: 13.9 % (ref 11.5–15.5)
WBC: 7.1 10*3/uL (ref 4.0–10.5)
nRBC: 0 % (ref 0.0–0.2)

## 2021-11-05 LAB — TSH: TSH: 3.647 u[IU]/mL (ref 0.350–4.500)

## 2021-11-05 LAB — BRAIN NATRIURETIC PEPTIDE: B Natriuretic Peptide: 237.2 pg/mL — ABNORMAL HIGH (ref 0.0–100.0)

## 2021-11-05 LAB — MAGNESIUM: Magnesium: 1.5 mg/dL — ABNORMAL LOW (ref 1.7–2.4)

## 2021-11-05 LAB — TROPONIN I (HIGH SENSITIVITY): Troponin I (High Sensitivity): 13 ng/L (ref <18)

## 2021-11-05 MED ORDER — FOLIC ACID 1 MG PO TABS
1.0000 mg | ORAL_TABLET | Freq: Every day | ORAL | Status: DC
  Administered 2021-11-05 – 2021-11-06 (×2): 1 mg via ORAL
  Filled 2021-11-05 (×2): qty 1

## 2021-11-05 MED ORDER — AMIODARONE HCL 200 MG PO TABS
200.0000 mg | ORAL_TABLET | Freq: Every day | ORAL | Status: DC
  Administered 2021-11-06: 200 mg via ORAL
  Filled 2021-11-05: qty 1

## 2021-11-05 MED ORDER — MIRTAZAPINE 15 MG PO TABS
15.0000 mg | ORAL_TABLET | Freq: Every day | ORAL | Status: DC
  Administered 2021-11-05: 15 mg via ORAL
  Filled 2021-11-05: qty 1

## 2021-11-05 MED ORDER — LORAZEPAM 0.5 MG PO TABS
0.5000 mg | ORAL_TABLET | Freq: Two times a day (BID) | ORAL | Status: DC | PRN
  Administered 2021-11-06: 0.5 mg via ORAL
  Filled 2021-11-05: qty 1

## 2021-11-05 MED ORDER — FUROSEMIDE 20 MG PO TABS
20.0000 mg | ORAL_TABLET | Freq: Every day | ORAL | Status: DC
Start: 2021-11-05 — End: 2021-11-06
  Administered 2021-11-05 – 2021-11-06 (×2): 20 mg via ORAL
  Filled 2021-11-05 (×2): qty 1

## 2021-11-05 MED ORDER — DILTIAZEM HCL-DEXTROSE 125-5 MG/125ML-% IV SOLN (PREMIX)
5.0000 mg/h | INTRAVENOUS | Status: DC
  Administered 2021-11-05: 5 mg/h via INTRAVENOUS
  Filled 2021-11-05: qty 125

## 2021-11-05 MED ORDER — MELATONIN 5 MG PO TABS
2.5000 mg | ORAL_TABLET | Freq: Every evening | ORAL | Status: DC | PRN
  Filled 2021-11-05: qty 1

## 2021-11-05 MED ORDER — LORAZEPAM 1 MG PO TABS: 1.0000 mg | ORAL_TABLET | ORAL | Status: DC | PRN

## 2021-11-05 MED ORDER — LORAZEPAM 2 MG/ML IJ SOLN
0.0000 mg | Freq: Four times a day (QID) | INTRAMUSCULAR | Status: DC
  Filled 2021-11-05: qty 1

## 2021-11-05 MED ORDER — AMLODIPINE BESYLATE 10 MG PO TABS
10.0000 mg | ORAL_TABLET | Freq: Every day | ORAL | Status: DC
  Administered 2021-11-06: 10 mg via ORAL
  Filled 2021-11-05: qty 1

## 2021-11-05 MED ORDER — MAGNESIUM SULFATE 2 GM/50ML IV SOLN
2.0000 g | Freq: Once | INTRAVENOUS | Status: AC
Start: 2021-11-05 — End: 2021-11-06
  Administered 2021-11-05: 2 g via INTRAVENOUS
  Filled 2021-11-05: qty 50

## 2021-11-05 MED ORDER — ADULT MULTIVITAMIN W/MINERALS CH: 1.0000 | ORAL_TABLET | Freq: Every day | ORAL | Status: DC

## 2021-11-05 MED ORDER — DOXYCYCLINE HYCLATE 100 MG PO TABS
100.0000 mg | ORAL_TABLET | Freq: Two times a day (BID) | ORAL | Status: DC
Start: 2021-11-05 — End: 2021-11-06
  Administered 2021-11-05 – 2021-11-06 (×2): 100 mg via ORAL
  Filled 2021-11-05 (×2): qty 1

## 2021-11-05 MED ORDER — PRESERVISION AREDS 2+MULTI VIT PO CAPS: 1.0000 | ORAL_CAPSULE | Freq: Every day | ORAL | Status: DC

## 2021-11-05 MED ORDER — FUROSEMIDE 40 MG PO TABS: 20.0000 mg | ORAL_TABLET | Freq: Every day | ORAL | Status: DC

## 2021-11-05 MED ORDER — ALBUTEROL SULFATE (2.5 MG/3ML) 0.083% IN NEBU: 2.5000 mg | INHALATION_SOLUTION | RESPIRATORY_TRACT | Status: DC | PRN

## 2021-11-05 MED ORDER — ADULT MULTIVITAMIN W/MINERALS CH
1.0000 | ORAL_TABLET | Freq: Every day | ORAL | Status: DC
  Administered 2021-11-05 – 2021-11-06 (×2): 1 via ORAL
  Filled 2021-11-05 (×2): qty 1

## 2021-11-05 MED ORDER — VITAMIN D3 25 MCG (1000 UNIT) PO TABS
2000.0000 [IU] | ORAL_TABLET | Freq: Every day | ORAL | Status: DC
  Filled 2021-11-05 (×2): qty 2

## 2021-11-05 MED ORDER — DILTIAZEM LOAD VIA INFUSION
10.0000 mg | Freq: Once | INTRAVENOUS | Status: AC
Start: 2021-11-05 — End: 2021-11-05
  Administered 2021-11-05: 10 mg via INTRAVENOUS
  Filled 2021-11-05: qty 10

## 2021-11-05 MED ORDER — LORAZEPAM 2 MG/ML IJ SOLN: 1.0000 mg | INTRAMUSCULAR | Status: DC | PRN

## 2021-11-05 MED ORDER — THIAMINE HCL 100 MG PO TABS
100.0000 mg | ORAL_TABLET | Freq: Every day | ORAL | Status: DC
  Administered 2021-11-05 – 2021-11-06 (×2): 100 mg via ORAL
  Filled 2021-11-05 (×2): qty 1

## 2021-11-05 MED ORDER — THIAMINE HCL 100 MG/ML IJ SOLN: 100.0000 mg | Freq: Every day | INTRAMUSCULAR | Status: DC

## 2021-11-05 MED ORDER — ALLOPURINOL 100 MG PO TABS
150.0000 mg | ORAL_TABLET | Freq: Every day | ORAL | Status: DC
  Administered 2021-11-06: 150 mg via ORAL
  Filled 2021-11-05: qty 2

## 2021-11-05 MED ORDER — LORAZEPAM 2 MG/ML IJ SOLN: 0.0000 mg | Freq: Two times a day (BID) | INTRAMUSCULAR | Status: DC

## 2021-11-05 MED ORDER — APIXABAN 2.5 MG PO TABS
2.5000 mg | ORAL_TABLET | Freq: Two times a day (BID) | ORAL | Status: DC
  Administered 2021-11-05 – 2021-11-06 (×2): 2.5 mg via ORAL
  Filled 2021-11-05 (×2): qty 1

## 2021-11-05 NOTE — Assessment & Plan Note (Signed)
No fever or leukocytosis. ?-continue home doxycycline. ?

## 2021-11-05 NOTE — H&P (Signed)
?History and Physical  ? ? ?LORRAINA SPRING Harris:[id number] DOB: 1940/07/26 DOA: 11/05/2021 ? ?Referring MD/NP/PA:  ? ?PCP: [name], MD  ? ?Patient coming from:  The patient is coming from home.  At baseline, pt is independent for most of ADL.       ? ?Chief Complaint: Palpitation ? ?HPI: [name] is a 81 y.o. female with medical history significant of atrial fibrillation on Eliquis, hypertension, hyperlipidemia, GERD, gout, depression, anxiety, wide-complex tachycardia, carotid artery stenosis, left breast cancer, alcohol use, dCHF, psoriatic arthritis, CKD-3A, MGUS, small left ankle boil on doxycycline, who presents with palpitation. ? ?Pt states that she woke up this morning feeling palpitation and heart racing.  Her heart rate was up to 140s per report.  Patient also has chest tightness and shortness of breath.  Denies cough, fever and chills.  No nausea, vomiting, diarrhea or abdominal pain.  No symptoms of UTI.  Patient states that she has a small skin boil in left ankle area. She is taking doxycycline, started from 4/14 for total of 7 days per PCP. ? ?Data Reviewed and ED Course: pt was found to have WBC 7.1, Mg 1.5, troponin level 13, renal function at baseline, temperature normal, blood pressure 105/72, heart rate 120-140s, oxygen saturation 88-94% on room air.  Chest x-ray negative.  Patient is placed on PCU observation ? ? ?EKG: I have personally reviewed.  Atrial fibrillation, QTc 480, heart rate 141, right axis deviation, anteroseptal infarction pattern. ? ?Review of Systems:  ? ?General: no fevers, chills, no body weight gain, fatigue ?HEENT: no blurry vision, hearing changes or sore throat ?Respiratory: has dyspnea, no coughing, wheezing ?CV: no chest pain, has palpitations and chest tightness ?GI: no nausea, vomiting, abdominal pain, diarrhea, constipation ?GU: no dysuria, burning on urination, increased urinary frequency, hematuria  ?Ext: has trace leg edema ?Neuro: no  unilateral weakness, numbness, or tingling, no vision change or hearing loss ?Skin: no skin tear. Has a small boil in left ankle ?MSK: No muscle spasm, no deformity, no limitation of range of movement in spin ?Heme: No easy bruising.  ?Travel history: No recent long distant travel. ? ? ?Allergy: No Known Allergies ? ?Past Medical History:  ?Diagnosis Date  ? (HFpEF) heart failure with preserved ejection fraction ([place])   ? a. 08/2017 Echo: EF 55-60%, no rwma, mild MR, mildly dil LA, nl RV fxn; b. 03/2020 Echo: EF 60-65%, no rwma, Gr2 DD. RVSP 44.52mHg. Mod dil LA. Mild MR; c. 09/2020 Echo: EF 50-55%, no rwma, mild LVH, mod red RV fxn, mildly dily RA.  ? Acute CHF (congestive heart failure) ([facility] 09/18/2020  ? Alcohol abuse 04/05/2020  ? Breast cancer ([facility] 2001  ? left breast  ? Cancer ([facility] 2001  ? left breast ca  ? Carotid arterial disease ([facility]   ? a. 10/2004 s/p L CEA; b. 12/2015 Carotid U/S: RICA 1-39%; b. LICA patent CEA site.  ? Closed right hip fracture ([facility] 09/20/2019  ? GERD (gastroesophageal reflux disease)   ? Hyperlipidemia   ? Hypertension   ? Osteoarthritis, multiple sites   ? Osteopenia   ? Persistent atrial fibrillation ([facility]   ? a. Dx 08/2017; b. CHA2DS2VASc = 6-->Pradaxa; c. 09/2017 Successful DCCV (second shock - 200J); d. 10/2017 Recurrent Afib-->flecainide started 11/2017; e. 06/2020 s/p DCCV (200J x 1); f. 06/2020 Recurrent AF->Flec inc 75bid; g. 09/2020 WCT->flec d/c'd->amio started.  ? Personal history of radiation therapy 2001  ? left breast ca  ? Psoriasis   ?  Wide-complex tachycardia   ? a. 09/2020 in setting of presumed Flecainide toxicity.  Flecainide d/c'd.  ? ? ?Past Surgical History:  ?Procedure Laterality Date  ? ABDOMINAL HYSTERECTOMY    ? ANKLE FRACTURE SURGERY  4/08  ? left---hardware still in place  ? BREAST BIOPSY Left 2001  ? breast ca  ? BREAST EXCISIONAL BIOPSY Left yrs ago  ? benign  ? BREAST LUMPECTOMY Left 2001  ? f/u radiation  ? CARDIOVERSION N/A 10/04/2017  ? Procedure: CARDIOVERSION;   Surgeon: Wellington Hampshire, MD;  Location: [facility] ORS;  Service: Cardiovascular;  Laterality: N/A;  ? CARDIOVERSION N/A 12/16/2017  ? Procedure: CARDIOVERSION;  Surgeon: [name], MD;  Location: [facility] ORS;  Service: Cardiovascular;  Laterality: N/A;  ? CARDIOVERSION N/A 06/24/2020  ? Procedure: CARDIOVERSION;  Surgeon: Wellington Hampshire, MD;  Location: [facility] ORS;  Service: Cardiovascular;  Laterality: N/A;  ? CAROTID ENDARTERECTOMY Left 10/23/2004  ? FRACTURE SURGERY    ? OOPHORECTOMY    ? SHOULDER SURGERY  6/07  ? left  ? TONSILLECTOMY AND ADENOIDECTOMY    ? TOTAL HIP ARTHROPLASTY  2004  ? right  ? ? ?Social History:  reports that she quit smoking about 26 years ago. Her smoking use included cigarettes. She has a 40.00 pack-year smoking history. She has never used smokeless tobacco. She reports current alcohol use of about 2.0 standard drinks per week. She reports that she does not use drugs. ? ?Family History:  ?Family History  ?Problem Relation Age of Onset  ? Hypertension Brother   ? Pneumonia Mother   ? Leukemia Father   ? Heart disease Neg Hx   ? Diabetes Neg Hx   ? Breast cancer Neg Hx   ?  ? ?Prior to Admission medications   ?Medication Sig Start Date End Date Taking? Authorizing Provider  ?acetaminophen (TYLENOL) 325 MG tablet Take 2 tablets (650 mg total) by mouth every 4 (four) hours as needed for headache or mild pain. 09/19/20   Swayze, Ava, DO  ?allopurinol (ZYLOPRIM) 100 MG tablet TAKE 1.5 TABLETS ('150MG'$  TOTAL) BY MOUTH DAILY 05/05/21   [name], MD  ?amiodarone (PACERONE) 200 MG tablet TAKE 1 TABLET BY MOUTH EVERY DAY 04/04/21   Kathyrn Drown D, NP  ?amLODipine (NORVASC) 10 MG tablet Take 1 tablet (10 mg total) by mouth daily. 08/01/21   [name], MD  ?apixaban (ELIQUIS) 2.5 MG TABS tablet Take 1 tablet (2.5 mg total) by mouth 2 (two) times daily. 09/09/21   [name], MD  ?cholecalciferol (VITAMIN D) 25 MCG (1000 UNIT) tablet Take 2 tablets (2,000 Units total) by mouth daily.  09/20/20   Swayze, Ava, DO  ?doxycycline (VIBRA-TABS) 100 MG tablet Take 1 tablet (100 mg total) by mouth 2 (two) times daily. 10/31/21   [name], MD  ?furosemide (LASIX) 40 MG tablet Take 0.5 tablet (20 mg) by mouth once daily as needed for swelling 08/14/21   [name], MD  ?LORazepam (ATIVAN) 0.5 MG tablet TAKE 1 TABLET BY MOUTH TWICE A DAY AS NEEDED FOR ANXIETY 09/22/21   Kennyth Arnold, FNP  ?mirtazapine (REMERON) 15 MG tablet TAKE 1 TABLET BY MOUTH EVERYDAY AT BEDTIME 09/22/21   Kennyth Arnold, FNP  ?Multiple Vitamin (MULTIVITAMIN) capsule Take 1 capsule by mouth daily.    [provider]  ?Multiple Vitamins-Minerals (PRESERVISION AREDS 2+MULTI VIT PO) Take 1 capsule by mouth in the morning and at bedtime.    [provider]  ? ? ?  Physical Exam: ?Vitals:  ? 11/05/21 1602 11/05/21 1700 11/05/21 1704 11/05/21 1748  ?BP:  (!) 108/91    ?Pulse: (!) 120 92    ?Resp: '10  19 17  '$ ?Temp:   98.2 ?F (36.8 ?C)   ?TempSrc:   Oral   ?SpO2: 99%     ?Weight:      ?Height:      ? ?General: Not in acute distress ?HEENT: ?      Eyes: PERRL, EOMI, no scleral icterus. ?      ENT: No discharge from the ears and nose, no pharynx injection, no tonsillar enlargement.  ?      Neck: No JVD, no bruit, no mass felt. ?Heme: No neck lymph node enlargement. ?Cardiac: S1/S2, RRR, No murmurs, No gallops or rubs. ?Respiratory: No rales, wheezing, rhonchi or rubs. ?GI: Soft, nondistended, nontender, no rebound pain, no organomegaly, BS present. ?GU: No hematuria ?Ext: has trace leg edema bilaterally. 1+DP/PT pulse bilaterally. ?Musculoskeletal: No joint deformities, No joint redness or warmth, no limitation of ROM in spin. ?Skin: Has a small boil in left ankle without deep drainage. ?Neuro: Alert, oriented X3, cranial nerves II-XII grossly intact, moves all extremities normally.  ?Psych: Patient is not psychotic, no suicidal or hemocidal ideation. ? ?Labs on Admission: I have personally reviewed following labs and  imaging studies ? ?CBC: ?Recent Labs  ?Lab 11/05/21 ?1232  ?WBC 7.1  ?HGB 12.7  ?HCT 38.1  ?MCV 100.5*  ?PLT 287  ? ?Basic Metabolic Panel: ?Recent Labs  ?Lab 11/05/21 ?1232  ?NA 139  ?K 4.0  ?CL 100  ?CO2 23  ?GLUC

## 2021-11-05 NOTE — Assessment & Plan Note (Signed)
HR 140s initially, responded to IV Cardizem drip, currently heart rate is 70s. ?-Placed on PCU for observation ?-Cardizem drip on hold ?-Continue home amiodarone ?-Check TSH ?-continue Eliquis ?

## 2021-11-05 NOTE — Telephone Encounter (Signed)
Per Epic pt is in the ED at [facility] ?

## 2021-11-05 NOTE — Assessment & Plan Note (Signed)
Allopurinol  

## 2021-11-05 NOTE — ED Provider Notes (Signed)
? ?[facility] ?Provider Note ? ? ? Event Date/Time  ? First MD Initiated Contact with Patient 11/05/21 1233   ?  (approximate) ? ? ?History  ? ?Atrial Fibrillation ? ? ?HPI ? ?[name] is a 81 y.o. female with history of atrial fibs, persistent atrial fibs, CAD, hyperlipidemia, hypertension, and GERD, and EtOH abuse presents emergency department with sudden onset of racing heart and shortness of breath and states her chest feels heavy.  Patient does take amiodarone for her atrial fibs.  States she took all of her medications this morning.  Was recently placed on doxycycline for a wound on her lower leg.  Is still taking Eliquis.  Patient denies any vomiting, diaphoresis.  No swelling in extremities. ? ?  ? ? ?Physical Exam  ? ?Triage Vital Signs: ?ED Triage Vitals  ?Enc Vitals Group  ?   BP --   ?   Pulse --   ?   Resp --   ?   Temp --   ?   Temp src --   ?   SpO2 --   ?   Weight 11/05/21 1231 118 lb (53.5 kg)  ?   Height 11/05/21 1231 '5\' 7"'$  (1.702 m)  ?   Head Circumference --   ?   Peak Flow --   ?   Pain Score 11/05/21 1230 0  ?   Pain Loc --   ?   Pain Edu? --   ?   Excl. in [place]? --   ? ? ?Most recent vital signs: ?Vitals:  ? 11/05/21 1600 11/05/21 1602  ?BP: (!) 128/93   ?Pulse: (!) 111 (!) 120  ?Resp: 18 10  ?Temp:    ?SpO2: 98% 99%  ? ? ? ?General: Awake, no distress.   ?CV:  Good peripheral perfusion.  Tachycardic with irregular rhythm, typical A-fib RVR  ?resp:  Normal effort. Lungs CTA ?Abd:  No distention.  ?Other:   ? ? ?ED Results / Procedures / Treatments  ? ?Labs ?(all labs ordered are listed, but only abnormal results are displayed) ?Labs Reviewed  ?BASIC METABOLIC PANEL - Abnormal; Notable for the following components:  ?    Result Value  ? Glucose, Bld 109 (*)   ? BUN 31 (*)   ? Creatinine, Ser 1.09 (*)   ? GFR, Estimated 51 (*)   ? Anion gap 16 (*)   ? All other components within normal limits  ?CBC - Abnormal; Notable for the following components:  ? RBC 3.79 (*)    ? MCV 100.5 (*)   ? All other components within normal limits  ?HEPATIC FUNCTION PANEL - Abnormal; Notable for the following components:  ? AST 77 (*)   ? All other components within normal limits  ?MAGNESIUM  ?BRAIN NATRIURETIC PEPTIDE  ?TROPONIN I (HIGH SENSITIVITY)  ? ? ? ?EKG ? ?EKG ? ? ?RADIOLOGY ?Chest x-ray ? ? ? ?PROCEDURES: ? ? ?Marland KitchenCritical Care E&M ?Performed by: Versie Starks, PA-C ? ?Critical care provider statement:  ?  Critical care time (minutes):  45 ?  Critical care time was exclusive of:  Separately billable procedures and treating other patients ?  Critical care was necessary to treat or prevent imminent or life-threatening deterioration of the following conditions:  Cardiac failure ?  Critical care was time spent personally by me on the following activities:  Blood draw for specimens, development of treatment plan with patient or surrogate, evaluation of patient's response to treatment, examination  of patient, obtaining history from patient or surrogate, ordering and review of laboratory studies, ordering and performing treatments and interventions, ordering and review of radiographic studies, pulse oximetry, re-evaluation of patient's condition and review of old charts ?  Care discussed with: admitting provider   ?After initial E/M assessment, critical care services were subsequently performed that were exclusive of separately billable procedures or treatment.  ? ? ? ?MEDICATIONS ORDERED IN ED: ?Medications  ?diltiazem (CARDIZEM) 1 mg/mL load via infusion 10 mg (10 mg Intravenous Bolus from Bag 11/05/21 1334)  ?  And  ?diltiazem (CARDIZEM) 125 mg in dextrose 5% 125 mL (1 mg/mL) infusion (5 mg/hr Intravenous New Bag/Given 11/05/21 1333)  ?allopurinol (ZYLOPRIM) tablet 150 mg (has no administration in time range)  ?amiodarone (PACERONE) tablet 200 mg (has no administration in time range)  ?amLODipine (NORVASC) tablet 10 mg (has no administration in time range)  ?furosemide (LASIX) tablet 20 mg (has  no administration in time range)  ?LORazepam (ATIVAN) tablet 0.5 mg (has no administration in time range)  ?mirtazapine (REMERON) tablet 15 mg (has no administration in time range)  ?apixaban (ELIQUIS) tablet 2.5 mg (has no administration in time range)  ?cholecalciferol (VITAMIN D) tablet 2,000 Units (has no administration in time range)  ?multivitamin with minerals tablet 1 tablet (has no administration in time range)  ? ? ? ?IMPRESSION / MDM / ASSESSMENT AND PLAN / ED COURSE  ?I reviewed the triage vital signs and the nursing notes. ?             ?               ? ?Differential diagnosis includes, but is not limited to, A-fib RVR, electrolyte imbalance, noncompliance to medication, MI, CHF ? ?CBC is normal, ? ?EKG shows atrial fibs RVR, see physician read ? ?Due to the A-fib RVR, will start patient on Cardizem 10 mg bolus, infusion as needed. ? ?Chest x-ray was independently reviewed by me and does not show any acute abnormality.  Confirmed by radiology ? ?Labs are reassuring, CBC was normal, hepatic function shows a increase for AST of 77, troponin is normal, basic metabolic panel does show increase in her BUN and creatinine which could indicate AKI, GFR is below 51 and her anion gap is also elevated at 16. ? ?We did start an infusion of Cardizem.  Feel that the patient does need to be admitted due to the A-fib RVR which has not improved greatly with medications.  Also feel that the patient most likely needs fluids to evaluate for her elevated BUN and creatinine. ? ?Consult to hospitalist. ?Dr. Tamala Julian to speak with hospitalist.  Hospitalist is admitting the patient. ? ? ?  ? ? ?FINAL CLINICAL IMPRESSION(S) / ED DIAGNOSES  ? ?Final diagnoses:  ?Atrial fibrillation with RVR ([place])  ? ? ? ?Rx / DC Orders  ? ?ED Discharge Orders   ? ? None  ? ?  ? ? ? ?Note:  This document was prepared using Dragon voice recognition software and may include unintentional dictation errors. ? ?  ?Versie Starks, PA-C ?11/05/21 1630 ? ?   ?Lucrezia Starch, MD ?11/05/21 1758 ? ?

## 2021-11-05 NOTE — Telephone Encounter (Signed)
Attempted to call the pt back but the phone rang and no answer. Will forward to Dr. Caryl Comes and his nurse.  ?

## 2021-11-05 NOTE — Assessment & Plan Note (Signed)
-   IV hydralazine as needed ?-Continue amlodipine ?-Patient is also on Lasix ?

## 2021-11-05 NOTE — Assessment & Plan Note (Signed)
Magnesium 1.5 -Repleted magnesium 

## 2021-11-05 NOTE — Assessment & Plan Note (Signed)
-   CIWA protocol -Did counseling about importance of quitting alcohol use 

## 2021-11-05 NOTE — Assessment & Plan Note (Signed)
2D echo on 09/19/2020 showed EF of 50-55%.  Patient has trace leg edema, but BNP is slightly elevated to 273.  Patient is taking as needed Lasix 20 mg at home ?-will schedule lasix 20 mg daily ?

## 2021-11-05 NOTE — Assessment & Plan Note (Signed)
-  stable ?-f/u with BMP ?

## 2021-11-05 NOTE — Telephone Encounter (Signed)
Patient c/o Palpitations:  High priority if patient c/o lightheadedness, shortness of breath, or chest pain ? ?How long have you had palpitations/irregular HR/ Afib? Are you having the symptoms now? yes ? ?Are you currently experiencing lightheadedness, SOB or CP? sob ? ?Do you have a history of afib (atrial fibrillation) or irregular heart rhythm? yes ? ?Have you checked your BP or HR? (document readings if available): no ? ?Are you experiencing any other symptoms? Tightness in chest ? ?

## 2021-11-05 NOTE — Assessment & Plan Note (Signed)
-   Continue home medications 

## 2021-11-05 NOTE — ED Triage Notes (Signed)
Pt here with afib with a rate in the 140s. Pt states she felt weak this morning and just happened to check her watch and saw that her HR was elevated. ?

## 2021-11-05 NOTE — ED Notes (Signed)
First Nurse Note:  Pt to ED via POV. Pt states that she has hx/o A. Fib. Pt reports that her watch went off alerting her to high HR. Pt states that she is unsure how high her HR was. Pt is in NAD.  ?

## 2021-11-05 NOTE — Plan of Care (Signed)
?  Problem: Education: ?Goal: Knowledge of General Education information will improve ?Description: Including pain rating scale, medication(s)/side effects and non-pharmacologic comfort measures ?11/05/2021 1717 by Carolyne Fiscal, RN ?Outcome: Progressing ?11/05/2021 1717 by Carolyne Fiscal, RN ?Outcome: Not Progressing ?  ?Problem: Health Behavior/Discharge Planning: ?Goal: Ability to manage health-related needs will improve ?11/05/2021 1717 by Carolyne Fiscal, RN ?Outcome: Progressing ?11/05/2021 1717 by Carolyne Fiscal, RN ?Outcome: Not Progressing ?  ?

## 2021-11-05 NOTE — Telephone Encounter (Signed)
I spoke with the pt and she reports that she woke up this morning feeling very SOB with minimal activity... she feels that her heart is beating very hard and her watch says it is "irregular".... she cannot tell if it is beating fast but just "hard".... she denies and dizziness, no chest pain.... she is audibly SOB on the phone... she lives alone and does not feel comfortable callikng EMS... she plans to check with a neighbor and will see iof she can have a fioend take her toe the ED.. ? ?Her BP cuff is not working right and she cannot get a reading of her BP or HR..  ? ?I will call her back to check on her and see if she can get a ride to the ED in [place].  ?

## 2021-11-06 DIAGNOSIS — Z789 Other specified health status: Secondary | ICD-10-CM | POA: Diagnosis not present

## 2021-11-06 DIAGNOSIS — I482 Chronic atrial fibrillation, unspecified: Secondary | ICD-10-CM | POA: Diagnosis not present

## 2021-11-06 DIAGNOSIS — N1831 Chronic kidney disease, stage 3a: Secondary | ICD-10-CM | POA: Diagnosis not present

## 2021-11-06 LAB — BASIC METABOLIC PANEL
Anion gap: 12 (ref 5–15)
BUN: 27 mg/dL — ABNORMAL HIGH (ref 8–23)
CO2: 29 mmol/L (ref 22–32)
Calcium: 9.9 mg/dL (ref 8.9–10.3)
Chloride: 95 mmol/L — ABNORMAL LOW (ref 98–111)
Creatinine, Ser: 1.12 mg/dL — ABNORMAL HIGH (ref 0.44–1.00)
GFR, Estimated: 49 mL/min — ABNORMAL LOW (ref >60)
Glucose, Bld: 95 mg/dL (ref 70–99)
Potassium: 3.6 mmol/L (ref 3.5–5.1)
Sodium: 136 mmol/L (ref 135–145)

## 2021-11-06 LAB — CBC
HCT: 39.3 % (ref 36.0–46.0)
Hemoglobin: 13.3 g/dL (ref 12.0–15.0)
MCH: 33.6 pg (ref 26.0–34.0)
MCHC: 33.8 g/dL (ref 30.0–36.0)
MCV: 99.2 fL (ref 80.0–100.0)
Platelets: 277 10*3/uL (ref 150–400)
RBC: 3.96 MIL/uL (ref 3.87–5.11)
RDW: 13.8 % (ref 11.5–15.5)
WBC: 5.8 10*3/uL (ref 4.0–10.5)
nRBC: 0 % (ref 0.0–0.2)

## 2021-11-06 LAB — MAGNESIUM: Magnesium: 1.9 mg/dL (ref 1.7–2.4)

## 2021-11-06 NOTE — Discharge Summary (Signed)
Physician Discharge Summary  ?[name] DGU:[id number] DOB: Dec 24, 1940 DOA: 11/05/2021 ? ?PCP: [name], MD ? ?Admit date: 11/05/2021 ?Discharge date: 11/06/2021 ? ?Admitted From: home ?Disposition:  home  ? ?Recommendations for Outpatient Follow-up:  ?Follow up with PCP in 1-2 weeks ?F/u w/ cardio, Dr. Caryl Comes, in 1-2 weeks ? ?Home Health: no ?Equipment/Devices: ? ?Discharge Condition: stable  ?CODE STATUS: full  ?Diet recommendation: Heart Healthy ? ?Brief/Interim Summary: ?HPI was taken from Dr. Blaine Hamper: ?[name] is a 81 y.o. female with medical history significant of atrial fibrillation on Eliquis, hypertension, hyperlipidemia, GERD, gout, depression, anxiety, wide-complex tachycardia, carotid artery stenosis, left breast cancer, alcohol use, dCHF, psoriatic arthritis, CKD-3A, MGUS, small left ankle boil on doxycycline, who presents with palpitation. ?  ?Pt states that she woke up this morning feeling palpitation and heart racing.  Her heart rate was up to 140s per report.  Patient also has chest tightness and shortness of breath.  Denies cough, fever and chills.  No nausea, vomiting, diarrhea or abdominal pain.  No symptoms of UTI.  Patient states that she has a small skin boil in left ankle area. She is taking doxycycline, started from 4/14 for total of 7 days per PCP. ?  ?Data Reviewed and ED Course: pt was found to have WBC 7.1, Mg 1.5, troponin level 13, renal function at baseline, temperature normal, blood pressure 105/72, heart rate 120-140s, oxygen saturation 88-94% on room air.  Chest x-ray negative.  Patient is placed on PCU observation ? ?As per Dr. Jimmye Norman 11/06/21: Pt presented w/ a. fib w/ RVR and given IV cardizem which resolved the pt's RVR. Pt was continue on home dose of amiodarone, eliquis. Pt's HR remain in 90s with ambulation and in 70s & 80s are rest on the day of d/c. Pt will f/u outpatient w/ her cardiologist, Dr. Caryl Comes, in 1-2 weeks. Pt verbalized her understanding.   ? ?Discharge Diagnoses:  ?Principal Problem: ?  Chronic atrial fibrillation with RVR ([place]) ?Active Problems: ?  HTN (hypertension) ?  Gout ?  Depression with anxiety ?  Chronic diastolic CHF (congestive heart failure) ([place]) ?  Chronic kidney disease, stage 3a ([place]) ?  Boil of lower extremity-left ankle ?  Alcohol use ?  Hypomagnesemia ? ?Chronic a. fib: w/ RVR. Continue on amiodarone, eliquis  ? ?Hypomagnesemia: WNL today  ?  ?Alcohol use: received alcohol cessation counseling. Continue on CIWA protocol ? ?Boil of lower extremity-left ankle: continue on home doxycycline  ?  ?CKDIIIa: Cr is labile  ? ?Chronic diastolic CHF: echo on 03/24/6386 showed EF of 50-55%.  Has trace leg edema, but BNP is slightly elevated to 273. Continue on lasix ?  ?Depression: severity unknown. Continue on home dose of remeron ?  ?Gout: continue on home dose of allopurinol  ? ?HTN: continue on amlodipine, amiodarone  ? ?Discharge Instructions ? ?Discharge Instructions   ? ? Diet - low sodium heart healthy   Complete by: As directed ?  ? Discharge instructions   Complete by: As directed ?  ? F/u cardio, Dr. Caryl Comes, in 1-2 weeks. F/u w/ PCP in 1-2 weeks  ? Increase activity slowly   Complete by: As directed ?  ? ?  ? ?Allergies as of 11/06/2021   ?No Known Allergies ?  ? ?  ?Medication List  ?  ? ?TAKE these medications   ? ?acetaminophen 325 MG tablet ?Commonly known as: TYLENOL ?Take 2 tablets (650 mg total) by mouth every 4 (four) hours as needed  for headache or mild pain. ?  ?allopurinol 100 MG tablet ?Commonly known as: ZYLOPRIM ?TAKE 1.5 TABLETS ('150MG'$  TOTAL) BY MOUTH DAILY ?  ?amiodarone 200 MG tablet ?Commonly known as: PACERONE ?TAKE 1 TABLET BY MOUTH EVERY DAY ?  ?amLODipine 10 MG tablet ?Commonly known as: NORVASC ?Take 1 tablet (10 mg total) by mouth daily. ?  ?apixaban 2.5 MG Tabs tablet ?Commonly known as: ELIQUIS ?Take 1 tablet (2.5 mg total) by mouth 2 (two) times daily. ?  ?cholecalciferol 25 MCG (1000 UNIT) tablet ?Commonly  known as: VITAMIN D ?Take 2 tablets (2,000 Units total) by mouth daily. ?  ?doxycycline 100 MG tablet ?Commonly known as: VIBRA-TABS ?Take 1 tablet (100 mg total) by mouth 2 (two) times daily. ?  ?furosemide 40 MG tablet ?Commonly known as: LASIX ?Take 0.5 tablet (20 mg) by mouth once daily as needed for swelling ?  ?LORazepam 0.5 MG tablet ?Commonly known as: ATIVAN ?TAKE 1 TABLET BY MOUTH TWICE A DAY AS NEEDED FOR ANXIETY ?  ?mirtazapine 15 MG tablet ?Commonly known as: REMERON ?TAKE 1 TABLET BY MOUTH EVERYDAY AT BEDTIME ?  ?multivitamin capsule ?Take 1 capsule by mouth daily. ?  ?PRESERVISION AREDS 2+MULTI VIT PO ?Take 1 capsule by mouth in the morning and at bedtime. ?  ? ?  ? ? ?No Known Allergies ? ?Consultations: ? ? ? ?Procedures/Studies: ?DG Chest 2 View ? ?Result Date: 11/05/2021 ?CLINICAL DATA:  Atrial fibrillation.  Weakness.  Tachycardia. EXAM: CHEST - 2 VIEW COMPARISON:  01/31/2019 FINDINGS: The heart size and mediastinal contours are within normal limits. Aortic atherosclerotic calcification noted. Both lungs are clear. The visualized skeletal structures are unremarkable. IMPRESSION: No active cardiopulmonary disease. Electronically Signed   By: Marlaine Hind M.D.   On: 11/05/2021 12:59   ?(Echo, Carotid, EGD, Colonoscopy, ERCP)  ? ? ?Subjective: Pt denies any complaints  ? ? ?Discharge Exam: ?Vitals:  ? 11/06/21 0620 11/06/21 0705  ?BP: 128/72 130/83  ?Pulse: 81 76  ?Resp:  16  ?Temp:  98.3 ?F (36.8 ?C)  ?SpO2:  98%  ? ?Vitals:  ? 11/06/21 0416 11/06/21 0500 11/06/21 0620 11/06/21 0705  ?BP: (!) 151/81  128/72 130/83  ?Pulse: 76  81 76  ?Resp: 17   16  ?Temp: 98.5 ?F (36.9 ?C)   98.3 ?F (36.8 ?C)  ?TempSrc: Oral   Oral  ?SpO2:    98%  ?Weight:  51.8 kg    ?Height:      ? ? ?General: Pt is alert, awake, not in acute distress ?Cardiovascular: irregularly irregular, no rubs, no gallops ?Respiratory: CTA bilaterally, no wheezing, no rhonchi ?Abdominal: Soft, NT, ND, bowel sounds + ?Extremities: no edema,  no cyanosis ? ? ? ?The results of significant diagnostics from this hospitalization (including imaging, microbiology, ancillary and laboratory) are listed below for reference.   ? ? ?Microbiology: ?No results found for this or any previous visit (from the past 240 hour(s)).  ? ?Labs: ?BNP (last 3 results) ?Recent Labs  ?  11/05/21 ?1232  ?BNP 237.2*  ? ?Basic Metabolic Panel: ?Recent Labs  ?Lab 11/05/21 ?1232 11/06/21 ?0503 11/06/21 ?0839  ?NA 139  --  136  ?K 4.0  --  3.6  ?CL 100  --  95*  ?CO2 23  --  29  ?GLUCOSE 109*  --  95  ?BUN 31*  --  27*  ?CREATININE 1.09*  --  1.12*  ?CALCIUM 10.0  --  9.9  ?MG 1.5* 1.9  --   ? ?Liver  Function Tests: ?Recent Labs  ?Lab 11/05/21 ?1344  ?AST 77*  ?ALT 42  ?ALKPHOS 88  ?BILITOT 0.9  ?PROT 7.5  ?ALBUMIN 4.1  ? ?No results for input(s): LIPASE, AMYLASE in the last 168 hours. ?No results for input(s): AMMONIA in the last 168 hours. ?CBC: ?Recent Labs  ?Lab 11/05/21 ?1232 11/06/21 ?0839  ?WBC 7.1 5.8  ?HGB 12.7 13.3  ?HCT 38.1 39.3  ?MCV 100.5* 99.2  ?PLT 287 277  ? ?Cardiac Enzymes: ?No results for input(s): CKTOTAL, CKMB, CKMBINDEX, TROPONINI in the last 168 hours. ?BNP: ?Invalid input(s): POCBNP ?CBG: ?No results for input(s): GLUCAP in the last 168 hours. ?D-Dimer ?No results for input(s): DDIMER in the last 72 hours. ?Hgb A1c ?No results for input(s): HGBA1C in the last 72 hours. ?Lipid Profile ?No results for input(s): CHOL, HDL, LDLCALC, TRIG, CHOLHDL, LDLDIRECT in the last 72 hours. ?Thyroid function studies ?Recent Labs  ?  11/05/21 ?1852  ?TSH 3.647  ? ?Anemia work up ?No results for input(s): VITAMINB12, FOLATE, FERRITIN, TIBC, IRON, RETICCTPCT in the last 72 hours. ?Urinalysis ?No results found for: COLORURINE, APPEARANCEUR, [place], [place], [place], [place], [place], KETONESUR, PROTEINUR, [place], NITRITE, LEUKOCYTESUR ?Sepsis Labs ?Invalid input(s): PROCALCITONIN,  WBC,  LACTICIDVEN ?Microbiology ?No results found for this or any previous visit (from the  past 240 hour(s)). ? ? ?Time coordinating discharge: Over 30 minutes ? ?SIGNED: ? ? ?Wyvonnia Dusky, MD  ?[facility] ?11/06/2021, 11:36 AM ?Pager  ? ?If 7PM-7AM, please contact night-coverage ?

## 2021-11-06 NOTE — Progress Notes (Signed)
Ativan administered at 6:00am per patient's request ?

## 2021-11-06 NOTE — Telephone Encounter (Signed)
To Dr. Caryl Comes to review. ?Patient currently admitted to [facility] with A-fib.  ?

## 2021-11-11 ENCOUNTER — Telehealth: Payer: Self-pay

## 2021-11-11 NOTE — Telephone Encounter (Signed)
LMTCB to schedule lab appointment for immunofixation lab. ?

## 2021-11-13 ENCOUNTER — Ambulatory Visit: Payer: Medicare Other | Admitting: Internal Medicine

## 2021-11-15 ENCOUNTER — Inpatient Hospital Stay: Payer: Medicare Other

## 2021-11-15 ENCOUNTER — Encounter: Payer: Self-pay | Admitting: Emergency Medicine

## 2021-11-15 ENCOUNTER — Inpatient Hospital Stay
Admission: EM | Admit: 2021-11-15 | Discharge: 2021-11-20 | DRG: 200 | Disposition: A | Discharge status: Home Health | Payer: Medicare Other | Attending: Internal Medicine | Admitting: Internal Medicine

## 2021-11-15 ENCOUNTER — Other Ambulatory Visit: Payer: Self-pay

## 2021-11-15 ENCOUNTER — Emergency Department: Payer: Medicare Other

## 2021-11-15 DIAGNOSIS — Z8249 Family history of ischemic heart disease and other diseases of the circulatory system: Secondary | ICD-10-CM

## 2021-11-15 DIAGNOSIS — S2242XA Multiple fractures of ribs, left side, initial encounter for closed fracture: Secondary | ICD-10-CM

## 2021-11-15 DIAGNOSIS — M62838 Other muscle spasm: Secondary | ICD-10-CM | POA: Diagnosis present

## 2021-11-15 DIAGNOSIS — Z87891 Personal history of nicotine dependence: Secondary | ICD-10-CM | POA: Diagnosis not present

## 2021-11-15 DIAGNOSIS — S270XXD Traumatic pneumothorax, subsequent encounter: Secondary | ICD-10-CM

## 2021-11-15 DIAGNOSIS — Z923 Personal history of irradiation: Secondary | ICD-10-CM | POA: Diagnosis not present

## 2021-11-15 DIAGNOSIS — I2510 Atherosclerotic heart disease of native coronary artery without angina pectoris: Secondary | ICD-10-CM | POA: Diagnosis present

## 2021-11-15 DIAGNOSIS — W1830XA Fall on same level, unspecified, initial encounter: Secondary | ICD-10-CM | POA: Diagnosis present

## 2021-11-15 DIAGNOSIS — J90 Pleural effusion, not elsewhere classified: Secondary | ICD-10-CM | POA: Diagnosis not present

## 2021-11-15 DIAGNOSIS — K219 Gastro-esophageal reflux disease without esophagitis: Secondary | ICD-10-CM | POA: Diagnosis not present

## 2021-11-15 DIAGNOSIS — Z20822 Contact with and (suspected) exposure to covid-19: Secondary | ICD-10-CM | POA: Diagnosis present

## 2021-11-15 DIAGNOSIS — F419 Anxiety disorder, unspecified: Secondary | ICD-10-CM | POA: Diagnosis not present

## 2021-11-15 DIAGNOSIS — I4819 Other persistent atrial fibrillation: Secondary | ICD-10-CM | POA: Diagnosis present

## 2021-11-15 DIAGNOSIS — L4050 Arthropathic psoriasis, unspecified: Secondary | ICD-10-CM | POA: Diagnosis present

## 2021-11-15 DIAGNOSIS — M159 Polyosteoarthritis, unspecified: Secondary | ICD-10-CM | POA: Diagnosis present

## 2021-11-15 DIAGNOSIS — J9811 Atelectasis: Secondary | ICD-10-CM | POA: Diagnosis not present

## 2021-11-15 DIAGNOSIS — Y92019 Unspecified place in single-family (private) house as the place of occurrence of the external cause: Secondary | ICD-10-CM

## 2021-11-15 DIAGNOSIS — I6521 Occlusion and stenosis of right carotid artery: Secondary | ICD-10-CM | POA: Diagnosis present

## 2021-11-15 DIAGNOSIS — Z96641 Presence of right artificial hip joint: Secondary | ICD-10-CM | POA: Diagnosis present

## 2021-11-15 DIAGNOSIS — Z79899 Other long term (current) drug therapy: Secondary | ICD-10-CM

## 2021-11-15 DIAGNOSIS — S270XXA Traumatic pneumothorax, initial encounter: Secondary | ICD-10-CM | POA: Diagnosis not present

## 2021-11-15 DIAGNOSIS — J918 Pleural effusion in other conditions classified elsewhere: Secondary | ICD-10-CM | POA: Diagnosis present

## 2021-11-15 DIAGNOSIS — Z7901 Long term (current) use of anticoagulants: Secondary | ICD-10-CM | POA: Diagnosis not present

## 2021-11-15 DIAGNOSIS — R0781 Pleurodynia: Secondary | ICD-10-CM | POA: Diagnosis not present

## 2021-11-15 DIAGNOSIS — N1831 Chronic kidney disease, stage 3a: Secondary | ICD-10-CM | POA: Diagnosis present

## 2021-11-15 DIAGNOSIS — Z853 Personal history of malignant neoplasm of breast: Secondary | ICD-10-CM

## 2021-11-15 DIAGNOSIS — I5032 Chronic diastolic (congestive) heart failure: Secondary | ICD-10-CM | POA: Diagnosis present

## 2021-11-15 DIAGNOSIS — S0990XA Unspecified injury of head, initial encounter: Secondary | ICD-10-CM | POA: Diagnosis not present

## 2021-11-15 DIAGNOSIS — E785 Hyperlipidemia, unspecified: Secondary | ICD-10-CM | POA: Diagnosis not present

## 2021-11-15 DIAGNOSIS — F418 Other specified anxiety disorders: Secondary | ICD-10-CM

## 2021-11-15 DIAGNOSIS — I480 Paroxysmal atrial fibrillation: Secondary | ICD-10-CM

## 2021-11-15 DIAGNOSIS — I4891 Unspecified atrial fibrillation: Secondary | ICD-10-CM | POA: Diagnosis not present

## 2021-11-15 DIAGNOSIS — J939 Pneumothorax, unspecified: Secondary | ICD-10-CM | POA: Diagnosis present

## 2021-11-15 DIAGNOSIS — S2242XK Multiple fractures of ribs, left side, subsequent encounter for fracture with nonunion: Secondary | ICD-10-CM

## 2021-11-15 DIAGNOSIS — R0789 Other chest pain: Secondary | ICD-10-CM | POA: Diagnosis not present

## 2021-11-15 DIAGNOSIS — S2249XA Multiple fractures of ribs, unspecified side, initial encounter for closed fracture: Secondary | ICD-10-CM | POA: Diagnosis not present

## 2021-11-15 DIAGNOSIS — Z806 Family history of leukemia: Secondary | ICD-10-CM | POA: Diagnosis not present

## 2021-11-15 DIAGNOSIS — I130 Hypertensive heart and chronic kidney disease with heart failure and stage 1 through stage 4 chronic kidney disease, or unspecified chronic kidney disease: Secondary | ICD-10-CM | POA: Diagnosis present

## 2021-11-15 DIAGNOSIS — I10 Essential (primary) hypertension: Secondary | ICD-10-CM | POA: Diagnosis present

## 2021-11-15 DIAGNOSIS — Z4682 Encounter for fitting and adjustment of non-vascular catheter: Secondary | ICD-10-CM | POA: Diagnosis not present

## 2021-11-15 DIAGNOSIS — M8580 Other specified disorders of bone density and structure, unspecified site: Secondary | ICD-10-CM | POA: Diagnosis present

## 2021-11-15 DIAGNOSIS — R42 Dizziness and giddiness: Secondary | ICD-10-CM | POA: Diagnosis not present

## 2021-11-15 LAB — BASIC METABOLIC PANEL
Anion gap: 10 (ref 5–15)
BUN: 22 mg/dL (ref 8–23)
CO2: 29 mmol/L (ref 22–32)
Calcium: 9.4 mg/dL (ref 8.9–10.3)
Chloride: 98 mmol/L (ref 98–111)
Creatinine, Ser: 1.02 mg/dL — ABNORMAL HIGH (ref 0.44–1.00)
GFR, Estimated: 55 mL/min — ABNORMAL LOW (ref >60)
Glucose, Bld: 118 mg/dL — ABNORMAL HIGH (ref 70–99)
Potassium: 3.7 mmol/L (ref 3.5–5.1)
Sodium: 137 mmol/L (ref 135–145)

## 2021-11-15 LAB — RESP PANEL BY RT-PCR (FLU A&B, COVID) ARPGX2
Influenza A by PCR: NEGATIVE
Influenza B by PCR: NEGATIVE
SARS Coronavirus 2 by RT PCR: NEGATIVE

## 2021-11-15 LAB — TROPONIN I (HIGH SENSITIVITY): Troponin I (High Sensitivity): 8 ng/L (ref <18)

## 2021-11-15 LAB — PROTIME-INR
INR: 1.4 — ABNORMAL HIGH (ref 0.8–1.2)
Prothrombin Time: 16.7 seconds — ABNORMAL HIGH (ref 11.4–15.2)

## 2021-11-15 LAB — CBC
HCT: 36.1 % (ref 36.0–46.0)
Hemoglobin: 11.8 g/dL — ABNORMAL LOW (ref 12.0–15.0)
MCH: 33.2 pg (ref 26.0–34.0)
MCHC: 32.7 g/dL (ref 30.0–36.0)
MCV: 101.7 fL — ABNORMAL HIGH (ref 80.0–100.0)
Platelets: 271 10*3/uL (ref 150–400)
RBC: 3.55 MIL/uL — ABNORMAL LOW (ref 3.87–5.11)
RDW: 14.2 % (ref 11.5–15.5)
WBC: 8.3 10*3/uL (ref 4.0–10.5)
nRBC: 0 % (ref 0.0–0.2)

## 2021-11-15 MED ORDER — THIAMINE HCL 100 MG/ML IJ SOLN: 100.0000 mg | Freq: Every day | INTRAMUSCULAR | Status: DC

## 2021-11-15 MED ORDER — OCUVITE-LUTEIN PO CAPS
1.0000 | ORAL_CAPSULE | Freq: Two times a day (BID) | ORAL | Status: DC
  Administered 2021-11-15 – 2021-11-20 (×10): 1 via ORAL
  Filled 2021-11-15 (×12): qty 1

## 2021-11-15 MED ORDER — LORAZEPAM 2 MG/ML IJ SOLN: 1.0000 mg | INTRAMUSCULAR | Status: AC | PRN

## 2021-11-15 MED ORDER — MIRTAZAPINE 15 MG PO TABS
15.0000 mg | ORAL_TABLET | Freq: Every day | ORAL | Status: DC
  Administered 2021-11-15 – 2021-11-19 (×5): 15 mg via ORAL
  Filled 2021-11-15 (×5): qty 1

## 2021-11-15 MED ORDER — METHOCARBAMOL 1000 MG/10ML IJ SOLN
500.0000 mg | Freq: Four times a day (QID) | INTRAVENOUS | Status: DC | PRN
  Filled 2021-11-15: qty 5

## 2021-11-15 MED ORDER — FOLIC ACID 1 MG PO TABS
1.0000 mg | ORAL_TABLET | Freq: Every day | ORAL | Status: DC
  Administered 2021-11-16 – 2021-11-20 (×5): 1 mg via ORAL
  Filled 2021-11-15 (×5): qty 1

## 2021-11-15 MED ORDER — VITAMIN D3 25 MCG (1000 UNIT) PO TABS
2000.0000 [IU] | ORAL_TABLET | Freq: Every day | ORAL | Status: DC
  Administered 2021-11-16 – 2021-11-20 (×5): 2000 [IU] via ORAL
  Filled 2021-11-15 (×10): qty 2

## 2021-11-15 MED ORDER — AMIODARONE HCL 200 MG PO TABS
200.0000 mg | ORAL_TABLET | Freq: Every day | ORAL | Status: DC
  Administered 2021-11-16 – 2021-11-20 (×5): 200 mg via ORAL
  Filled 2021-11-15 (×5): qty 1

## 2021-11-15 MED ORDER — MORPHINE SULFATE (PF) 2 MG/ML IV SOLN: 2.0000 mg | INTRAVENOUS | Status: DC | PRN

## 2021-11-15 MED ORDER — BACITRACIN-NEOMYCIN-POLYMYXIN 400-5-5000 EX OINT
TOPICAL_OINTMENT | CUTANEOUS | Status: DC | PRN
  Filled 2021-11-15: qty 1

## 2021-11-15 MED ORDER — DOXYCYCLINE HYCLATE 100 MG PO TABS: 100.0000 mg | ORAL_TABLET | Freq: Two times a day (BID) | ORAL | Status: DC

## 2021-11-15 MED ORDER — MIDAZOLAM HCL 2 MG/2ML IJ SOLN
INTRAMUSCULAR | Status: AC
  Filled 2021-11-15: qty 2

## 2021-11-15 MED ORDER — FUROSEMIDE 40 MG PO TABS
20.0000 mg | ORAL_TABLET | ORAL | Status: DC
  Administered 2021-11-17 – 2021-11-19 (×2): 20 mg via ORAL
  Filled 2021-11-15 (×2): qty 1

## 2021-11-15 MED ORDER — FENTANYL CITRATE PF 50 MCG/ML IJ SOSY
50.0000 ug | PREFILLED_SYRINGE | Freq: Once | INTRAMUSCULAR | Status: AC
  Administered 2021-11-15: 50 ug via INTRAVENOUS
  Filled 2021-11-15: qty 1

## 2021-11-15 MED ORDER — MORPHINE SULFATE (PF) 4 MG/ML IV SOLN
4.0000 mg | Freq: Once | INTRAVENOUS | Status: AC
  Administered 2021-11-15: 4 mg via INTRAVENOUS
  Filled 2021-11-15: qty 1

## 2021-11-15 MED ORDER — ONDANSETRON HCL 4 MG/2ML IJ SOLN
4.0000 mg | Freq: Once | INTRAMUSCULAR | Status: AC
  Administered 2021-11-15: 4 mg via INTRAVENOUS
  Filled 2021-11-15: qty 2

## 2021-11-15 MED ORDER — ONDANSETRON HCL 4 MG/2ML IJ SOLN: 4.0000 mg | Freq: Four times a day (QID) | INTRAMUSCULAR | Status: DC | PRN

## 2021-11-15 MED ORDER — AMLODIPINE BESYLATE 5 MG PO TABS
10.0000 mg | ORAL_TABLET | Freq: Every day | ORAL | Status: DC
  Administered 2021-11-16 – 2021-11-20 (×5): 10 mg via ORAL
  Filled 2021-11-15 (×5): qty 2

## 2021-11-15 MED ORDER — LORAZEPAM 1 MG PO TABS: 1.0000 mg | ORAL_TABLET | ORAL | Status: AC | PRN

## 2021-11-15 MED ORDER — SODIUM CHLORIDE 0.9 % IV SOLN: 250.0000 mL | INTRAVENOUS | Status: DC | PRN

## 2021-11-15 MED ORDER — LORAZEPAM 2 MG PO TABS: 0.0000 mg | ORAL_TABLET | Freq: Four times a day (QID) | ORAL | Status: AC

## 2021-11-15 MED ORDER — FENTANYL CITRATE PF 50 MCG/ML IJ SOSY
25.0000 ug | PREFILLED_SYRINGE | INTRAMUSCULAR | Status: DC | PRN
Start: 2021-11-15 — End: 2021-11-20
  Administered 2021-11-15 – 2021-11-17 (×18): 25 ug via INTRAVENOUS
  Filled 2021-11-15 (×18): qty 1

## 2021-11-15 MED ORDER — LORAZEPAM 2 MG PO TABS: 0.0000 mg | ORAL_TABLET | Freq: Two times a day (BID) | ORAL | Status: AC

## 2021-11-15 MED ORDER — MIDAZOLAM HCL 2 MG/2ML IJ SOLN
INTRAMUSCULAR | Status: AC | PRN
  Administered 2021-11-15 (×2): 1 mg via INTRAVENOUS

## 2021-11-15 MED ORDER — THIAMINE HCL 100 MG PO TABS
100.0000 mg | ORAL_TABLET | Freq: Every day | ORAL | Status: DC
  Administered 2021-11-16 – 2021-11-20 (×5): 100 mg via ORAL
  Filled 2021-11-15 (×5): qty 1

## 2021-11-15 MED ORDER — MULTIVITAMINS PO CAPS: 1.0000 | ORAL_CAPSULE | Freq: Every day | ORAL | Status: DC

## 2021-11-15 MED ORDER — ONDANSETRON HCL 4 MG PO TABS: 4.0000 mg | ORAL_TABLET | Freq: Four times a day (QID) | ORAL | Status: DC | PRN

## 2021-11-15 MED ORDER — ALLOPURINOL 300 MG PO TABS
150.0000 mg | ORAL_TABLET | Freq: Every day | ORAL | Status: DC
  Administered 2021-11-16 – 2021-11-20 (×5): 150 mg via ORAL
  Filled 2021-11-15 (×5): qty 0.5

## 2021-11-15 MED ORDER — SODIUM CHLORIDE 0.9% FLUSH: 3.0000 mL | INTRAVENOUS | Status: DC | PRN

## 2021-11-15 MED ORDER — ACETAMINOPHEN 325 MG PO TABS
650.0000 mg | ORAL_TABLET | ORAL | Status: DC | PRN
  Administered 2021-11-19 – 2021-11-20 (×3): 650 mg via ORAL
  Filled 2021-11-15 (×4): qty 2

## 2021-11-15 MED ORDER — LORAZEPAM 0.5 MG PO TABS
0.5000 mg | ORAL_TABLET | Freq: Two times a day (BID) | ORAL | Status: DC | PRN
  Administered 2021-11-15 – 2021-11-18 (×4): 0.5 mg via ORAL
  Filled 2021-11-15 (×4): qty 1

## 2021-11-15 MED ORDER — SODIUM CHLORIDE 0.9% FLUSH
3.0000 mL | Freq: Two times a day (BID) | INTRAVENOUS | Status: DC
  Administered 2021-11-15 – 2021-11-20 (×10): 3 mL via INTRAVENOUS

## 2021-11-15 NOTE — ED Notes (Addendum)
Non-adhesive dressing applied to L upper arm over skin tear with neosporin.  ?

## 2021-11-15 NOTE — Consult Note (Signed)
[facility] ?SURGICAL CONSULTATION NOTE (initial) - cpt: 33295 ? ? ?HISTORY OF PRESENT ILLNESS (HPI):  ?81 y.o. female presented to [facility] ED today for evaluation of persisting pain 2 days after a fall. Patient reports falling onto her left side landing on a couch.  She attempted to manage her pain at home with Tylenol and rest however it never improved.  No loss of consciousness, no fevers or chills.  History of atrial fibrillation taking Eliquis.  She now presents with a CT scan which shows an anterior left-sided pneumothorax, with 3 adjacent rib fractures posteriorly. ? ?Surgery is consulted by ED physician Dr. Kerman Passey in this context for evaluation and management of blunt trauma in this 81 year old.  In discussion we felt it prudent that IR place a pigtail drain for the pneumothorax considering her Eliquis status.  Appreciate hospitalist admission. ? ?PAST MEDICAL HISTORY (PMH):  ?Past Medical History:  ?Diagnosis Date  ? (HFpEF) heart failure with preserved ejection fraction ([place])   ? a. 08/2017 Echo: EF 55-60%, no rwma, mild MR, mildly dil LA, nl RV fxn; b. 03/2020 Echo: EF 60-65%, no rwma, Gr2 DD. RVSP 44.86mHg. Mod dil LA. Mild MR; c. 09/2020 Echo: EF 50-55%, no rwma, mild LVH, mod red RV fxn, mildly dily RA.  ? Acute CHF (congestive heart failure) ([place] 09/18/2020  ? Alcohol abuse 04/05/2020  ? Breast cancer ([place] 2001  ? left breast  ? Cancer ([place] 2001  ? left breast ca  ? Carotid arterial disease ([place]   ? a. 10/2004 s/p L CEA; b. 12/2015 Carotid U/S: RICA 1-39%; b. LICA patent CEA site.  ? Closed right hip fracture ([place] 09/20/2019  ? GERD (gastroesophageal reflux disease)   ? Hyperlipidemia   ? Hypertension   ? Osteoarthritis, multiple sites   ? Osteopenia   ? Persistent atrial fibrillation ([place]   ? a. Dx 08/2017; b. CHA2DS2VASc = 6-->Pradaxa; c. 09/2017 Successful DCCV (second shock - 200J); d. 10/2017 Recurrent Afib-->flecainide started 11/2017; e. 06/2020 s/p DCCV (200J x 1); f. 06/2020  Recurrent AF->Flec inc 75bid; g. 09/2020 WCT->flec d/c'd->amio started.  ? Personal history of radiation therapy 2001  ? left breast ca  ? Psoriasis   ? Wide-complex tachycardia   ? a. 09/2020 in setting of presumed Flecainide toxicity.  Flecainide d/c'd.  ?  ? ?PAST SURGICAL HISTORY ([place]:  ?Past Surgical History:  ?Procedure Laterality Date  ? ABDOMINAL HYSTERECTOMY    ? ANKLE FRACTURE SURGERY  4/08  ? left---hardware still in place  ? BREAST BIOPSY Left 2001  ? breast ca  ? BREAST EXCISIONAL BIOPSY Left yrs ago  ? benign  ? BREAST LUMPECTOMY Left 2001  ? f/u radiation  ? CARDIOVERSION N/A 10/04/2017  ? Procedure: CARDIOVERSION;  Surgeon: [name] MD;  Location: [facility] ORS;  Service: Cardiovascular;  Laterality: N/A;  ? CARDIOVERSION N/A 12/16/2017  ? Procedure: CARDIOVERSION;  Surgeon: [name] MD;  Location: [facility] ORS;  Service: Cardiovascular;  Laterality: N/A;  ? CARDIOVERSION N/A 06/24/2020  ? Procedure: CARDIOVERSION;  Surgeon: [name] MD;  Location: [facility] ORS;  Service: Cardiovascular;  Laterality: N/A;  ? CAROTID ENDARTERECTOMY Left 10/23/2004  ? FRACTURE SURGERY    ? OOPHORECTOMY    ? SHOULDER SURGERY  6/07  ? left  ? TONSILLECTOMY AND ADENOIDECTOMY    ? TOTAL HIP ARTHROPLASTY  2004  ? right  ?  ? ?MEDICATIONS:  ?Prior to Admission medications   ?Medication Sig Start Date End Date Taking? Authorizing Provider  ?  acetaminophen (TYLENOL) 325 MG tablet Take 2 tablets (650 mg total) by mouth every 4 (four) hours as needed for headache or mild pain. 09/19/20   Swayze, Ava, DO  ?allopurinol (ZYLOPRIM) 100 MG tablet TAKE 1.5 TABLETS ('150MG'$  TOTAL) BY MOUTH DAILY 05/05/21   [name], MD  ?amiodarone (PACERONE) 200 MG tablet TAKE 1 TABLET BY MOUTH EVERY DAY 04/04/21   Kathyrn Drown D, NP  ?amLODipine (NORVASC) 10 MG tablet Take 1 tablet (10 mg total) by mouth daily. 08/01/21   [name], MD  ?apixaban (ELIQUIS) 2.5 MG TABS tablet Take 1 tablet (2.5 mg total) by mouth 2 (two) times  daily. 09/09/21   [name], MD  ?cholecalciferol (VITAMIN D) 25 MCG (1000 UNIT) tablet Take 2 tablets (2,000 Units total) by mouth daily. 09/20/20   Swayze, Ava, DO  ?doxycycline (VIBRA-TABS) 100 MG tablet Take 1 tablet (100 mg total) by mouth 2 (two) times daily. 10/31/21   [name], MD  ?furosemide (LASIX) 40 MG tablet Take 0.5 tablet (20 mg) by mouth once daily as needed for swelling 08/14/21   [name], MD  ?LORazepam (ATIVAN) 0.5 MG tablet TAKE 1 TABLET BY MOUTH TWICE A DAY AS NEEDED FOR ANXIETY 09/22/21   Kennyth Arnold, FNP  ?mirtazapine (REMERON) 15 MG tablet TAKE 1 TABLET BY MOUTH EVERYDAY AT BEDTIME 09/22/21   Kennyth Arnold, FNP  ?Multiple Vitamin (MULTIVITAMIN) capsule Take 1 capsule by mouth daily.    [provider]  ?Multiple Vitamins-Minerals (PRESERVISION AREDS 2+MULTI VIT PO) Take 1 capsule by mouth in the morning and at bedtime.    [provider]  ?  ? ?ALLERGIES:  ?No Known Allergies  ? ?SOCIAL HISTORY:  ?Social History  ? ?Socioeconomic History  ? Marital status: Widowed  ?  Spouse name: Not on file  ? Number of children: Not on file  ? Years of education: Not on file  ? Highest education level: Not on file  ?Occupational History  ? Occupation: Pharmacologist  ?  Comment: Retired  ?Tobacco Use  ? Smoking status: Former  ?  Packs/day: 1.00  ?  Years: 40.00  ?  Pack years: 40.00  ?  Types: Cigarettes  ?  Quit date: 07/21/1995  ?  Years since quitting: 26.3  ? Smokeless tobacco: Never  ?Vaping Use  ? Vaping Use: Never used  ?Substance and Sexual Activity  ? Alcohol use: Yes  ?  Alcohol/week: 2.0 standard drinks  ?  Types: 1 Glasses of wine, 1 Standard drinks or equivalent per week  ?  Comment: 1 glass of wine and 1 drink of liquor daily  ? Drug use: No  ? Sexual activity: Never  ?Other Topics Concern  ? Not on file  ?Social History Narrative  ? 1 natural child  ? 3 adopted children  ? Artist---still teaches water colors  ? Husband has Alzheimers  ?   ? Has living will   ? Daughter Amy is health care POA  ? DNR   ? No tube feeds if cognitively unaware  ? ?Social Determinants of Health  ? ?Financial Resource Strain: Low Risk   ? Difficulty of Paying Living Expenses: Not hard at all  ?Food Insecurity: No Food Insecurity  ? Worried About Charity fundraiser in the Last Year: Never true  ? Ran Out of Food in the Last Year: Never true  ?Transportation Needs: No Transportation Needs  ? Lack of Transportation (Medical): No  ? Lack  of Transportation (Non-Medical): No  ?Physical Activity: Unknown  ? Days of Exercise per Week: 0 days  ? Minutes of Exercise per Session: Not on file  ?Stress: No Stress Concern Present  ? Feeling of Stress : Only a little  ?Social Connections: Unknown  ? Frequency of Communication with Friends and Family: More than three times a week  ? Frequency of Social Gatherings with Friends and Family: More than three times a week  ? Attends Religious Services: Not on file  ? Active Member of Clubs or Organizations: Not on file  ? Attends Archivist Meetings: Not on file  ? Marital Status: Not on file  ?Intimate Partner Violence: Not At Risk  ? Fear of Current or Ex-Partner: No  ? Emotionally Abused: No  ? Physically Abused: No  ? Sexually Abused: No  ?  ? ?FAMILY HISTORY:  ?Family History  ?Problem Relation Age of Onset  ? Hypertension Brother   ? Pneumonia Mother   ? Leukemia Father   ? Heart disease Neg Hx   ? Diabetes Neg Hx   ? Breast cancer Neg Hx   ?  ? ? ?REVIEW OF SYSTEMS:  ?Review of Systems  ?Constitutional:  Negative for chills, diaphoresis and fever.  ?HENT: Negative.    ?Eyes: Negative.   ?Respiratory:  Negative for hemoptysis and sputum production.   ?Cardiovascular:  Positive for chest pain.  ?Gastrointestinal:  Negative for blood in stool, constipation, diarrhea and melena.  ?Genitourinary: Negative.   ?Skin: Negative.   ?Neurological:  Negative for dizziness.  ?Psychiatric/Behavioral: Negative.    ? ?VITAL SIGNS:  ?Temp:  [98.2 ?F (36.8 ?C)]  98.2 ?F (36.8 ?C) (04/29 0902) ?Pulse Rate:  [69-71] 69 (04/29 1030) ?Resp:  [20] 20 (04/29 1030) ?BP: (151-159)/(77-82) 151/77 (04/29 1030) ?SpO2:  [98 %-100 %] 98 % (04/29 1030) ?Weight:  [53.5 kg] 53.5 kg (04/2

## 2021-11-15 NOTE — ED Notes (Signed)
Pt gave verbal permission to update family member Sharee Pimple [phone]; briefly updated.  ?

## 2021-11-15 NOTE — Consult Note (Signed)
? ?Chief Complaint: Patient was seen in consultation today for left chest tube placement. ? ?Referring Physician(s): Collier Bullock, MD ? ?Supervising Physician: Michaelle Birks ? ?Patient Status: [facility] - ED ? ?History of Present Illness: ?[name] is a 81 y.o. female with a past medical history significant for ETOH abuse, GERD, CAD, HTN, HLD, a.fib on Eliquis, CHF who presented to [facility] ED today with complaints of left sided pain that began after a fall 2 days ago. CT chest w/o contrast was performed which showed: ? ?1. Acute fractures of the posterolateral left fifth through seventh ?ribs . ?2. Small anterior left pneumothorax associated with left lower lobe ?collapse/consolidative opacity. ?3. Small left pleural effusion with somewhat heterogeneous ?attenuation of the dependent fluid raising the question of ?associated blood in the pleural space. ?4. Aortic Atherosclerosis (ICD10-I70.0). ? ?IR has been consulted for left chest tube placement due left pneumothorax with associated likely hemothorax.  ? ?Past Medical History:  ?Diagnosis Date  ? (HFpEF) heart failure with preserved ejection fraction ([place])   ? a. 08/2017 Echo: EF 55-60%, no rwma, mild MR, mildly dil LA, nl RV fxn; b. 03/2020 Echo: EF 60-65%, no rwma, Gr2 DD. RVSP 44.75mHg. Mod dil LA. Mild MR; c. 09/2020 Echo: EF 50-55%, no rwma, mild LVH, mod red RV fxn, mildly dily RA.  ? Acute CHF (congestive heart failure) ([place] 09/18/2020  ? Alcohol abuse 04/05/2020  ? Breast cancer ([place] 2001  ? left breast  ? Cancer ([place] 2001  ? left breast ca  ? Carotid arterial disease ([facility]   ? a. 10/2004 s/p L CEA; b. 12/2015 Carotid U/S: RICA 1-39%; b. LICA patent CEA site.  ? Closed right hip fracture ([place] 09/20/2019  ? GERD (gastroesophageal reflux disease)   ? Hyperlipidemia   ? Hypertension   ? Osteoarthritis, multiple sites   ? Osteopenia   ? Persistent atrial fibrillation ([place]   ? a. Dx 08/2017; b. CHA2DS2VASc = 6-->Pradaxa; c. 09/2017 Successful DCCV (second  shock - 200J); d. 10/2017 Recurrent Afib-->flecainide started 11/2017; e. 06/2020 s/p DCCV (200J x 1); f. 06/2020 Recurrent AF->Flec inc 75bid; g. 09/2020 WCT->flec d/c'd->amio started.  ? Personal history of radiation therapy 2001  ? left breast ca  ? Psoriasis   ? Wide-complex tachycardia   ? a. 09/2020 in setting of presumed Flecainide toxicity.  Flecainide d/c'd.  ? ? ?Past Surgical History:  ?Procedure Laterality Date  ? ABDOMINAL HYSTERECTOMY    ? ANKLE FRACTURE SURGERY  4/08  ? left---hardware still in place  ? BREAST BIOPSY Left 2001  ? breast ca  ? BREAST EXCISIONAL BIOPSY Left yrs ago  ? benign  ? BREAST LUMPECTOMY Left 2001  ? f/u radiation  ? CARDIOVERSION N/A 10/04/2017  ? Procedure: CARDIOVERSION;  Surgeon: [name] MD;  Location: [facility] ORS;  Service: Cardiovascular;  Laterality: N/A;  ? CARDIOVERSION N/A 12/16/2017  ? Procedure: CARDIOVERSION;  Surgeon: [name] MD;  Location: [facility] ORS;  Service: Cardiovascular;  Laterality: N/A;  ? CARDIOVERSION N/A 06/24/2020  ? Procedure: CARDIOVERSION;  Surgeon: [name] MD;  Location: [facility] ORS;  Service: Cardiovascular;  Laterality: N/A;  ? CAROTID ENDARTERECTOMY Left 10/23/2004  ? FRACTURE SURGERY    ? OOPHORECTOMY    ? SHOULDER SURGERY  6/07  ? left  ? TONSILLECTOMY AND ADENOIDECTOMY    ? TOTAL HIP ARTHROPLASTY  2004  ? right  ? ? ?Allergies: ?Patient has no known allergies. ? ?Medications: ?Prior to Admission medications   ?Medication Sig Start  Date End Date Taking? Authorizing Provider  ?acetaminophen (TYLENOL) 325 MG tablet Take 2 tablets (650 mg total) by mouth every 4 (four) hours as needed for headache or mild pain. 09/19/20   Swayze, Ava, DO  ?allopurinol (ZYLOPRIM) 100 MG tablet TAKE 1.5 TABLETS ('150MG'$  TOTAL) BY MOUTH DAILY 05/05/21   [name], MD  ?amiodarone (PACERONE) 200 MG tablet TAKE 1 TABLET BY MOUTH EVERY DAY 04/04/21   Kathyrn Drown D, NP  ?amLODipine (NORVASC) 10 MG tablet Take 1 tablet (10 mg total) by mouth daily.  08/01/21   [name], MD  ?apixaban (ELIQUIS) 2.5 MG TABS tablet Take 1 tablet (2.5 mg total) by mouth 2 (two) times daily. 09/09/21   [name], MD  ?cholecalciferol (VITAMIN D) 25 MCG (1000 UNIT) tablet Take 2 tablets (2,000 Units total) by mouth daily. 09/20/20   Swayze, Ava, DO  ?doxycycline (VIBRA-TABS) 100 MG tablet Take 1 tablet (100 mg total) by mouth 2 (two) times daily. 10/31/21   [name], MD  ?furosemide (LASIX) 40 MG tablet Take 0.5 tablet (20 mg) by mouth once daily as needed for swelling 08/14/21   [name], MD  ?LORazepam (ATIVAN) 0.5 MG tablet TAKE 1 TABLET BY MOUTH TWICE A DAY AS NEEDED FOR ANXIETY 09/22/21   Kennyth Arnold, FNP  ?mirtazapine (REMERON) 15 MG tablet TAKE 1 TABLET BY MOUTH EVERYDAY AT BEDTIME 09/22/21   Kennyth Arnold, FNP  ?Multiple Vitamin (MULTIVITAMIN) capsule Take 1 capsule by mouth daily.    [provider]  ?Multiple Vitamins-Minerals (PRESERVISION AREDS 2+MULTI VIT PO) Take 1 capsule by mouth in the morning and at bedtime.    [provider]  ?  ? ?Family History  ?Problem Relation Age of Onset  ? Hypertension Brother   ? Pneumonia Mother   ? Leukemia Father   ? Heart disease Neg Hx   ? Diabetes Neg Hx   ? Breast cancer Neg Hx   ? ? ?Social History  ? ?Socioeconomic History  ? Marital status: Widowed  ?  Spouse name: Not on file  ? Number of children: Not on file  ? Years of education: Not on file  ? Highest education level: Not on file  ?Occupational History  ? Occupation: Pharmacologist  ?  Comment: Retired  ?Tobacco Use  ? Smoking status: Former  ?  Packs/day: 1.00  ?  Years: 40.00  ?  Pack years: 40.00  ?  Types: Cigarettes  ?  Quit date: 07/21/1995  ?  Years since quitting: 26.3  ? Smokeless tobacco: Never  ?Vaping Use  ? Vaping Use: Never used  ?Substance and Sexual Activity  ? Alcohol use: Yes  ?  Alcohol/week: 2.0 standard drinks  ?  Types: 1 Glasses of wine, 1 Standard drinks or equivalent per week  ?  Comment: 1 glass of wine and 1  drink of liquor daily  ? Drug use: No  ? Sexual activity: Never  ?Other Topics Concern  ? Not on file  ?Social History Narrative  ? 1 natural child  ? 3 adopted children  ? Artist---still teaches water colors  ? Husband has Alzheimers  ?   ? Has living will  ? Daughter Amy is health care POA  ? DNR   ? No tube feeds if cognitively unaware  ? ?Social Determinants of Health  ? ?Financial Resource Strain: Low Risk   ? Difficulty of Paying Living Expenses: Not hard at all  ?Food Insecurity: No Food  Insecurity  ? Worried About Charity fundraiser in the Last Year: Never true  ? Ran Out of Food in the Last Year: Never true  ?Transportation Needs: No Transportation Needs  ? Lack of Transportation (Medical): No  ? Lack of Transportation (Non-Medical): No  ?Physical Activity: Unknown  ? Days of Exercise per Week: 0 days  ? Minutes of Exercise per Session: Not on file  ?Stress: No Stress Concern Present  ? Feeling of Stress : Only a little  ?Social Connections: Unknown  ? Frequency of Communication with Friends and Family: More than three times a week  ? Frequency of Social Gatherings with Friends and Family: More than three times a week  ? Attends Religious Services: Not on file  ? Active Member of Clubs or Organizations: Not on file  ? Attends Archivist Meetings: Not on file  ? Marital Status: Not on file  ? ? ? ?Review of Systems: A 12 point ROS discussed and pertinent positives are indicated in the HPI above.  All other systems are negative. ? ?Review of Systems  ?Constitutional:  Negative for chills and fever.  ?Respiratory:  Negative for cough and shortness of breath.   ?Cardiovascular:  Positive for chest pain.  ?Gastrointestinal:  Negative for abdominal pain, nausea and vomiting.  ?Musculoskeletal:  Negative for back pain.  ?Neurological:  Negative for dizziness and headaches.  ? ?Vital Signs: ?BP (!) 156/79   Pulse 71   Temp 98.2 ?F (36.8 ?C) (Axillary)   Resp (!) 21   Ht '5\' 10"'$  (1.778 m)   Wt 118 lb  (53.5 kg)   SpO2 100%   BMI 16.93 kg/m?  ? ?Physical Exam ?Vitals and nursing note reviewed.  ?Constitutional:   ?   General: She is not in acute distress. ?   Comments: Significant pain with movement  ?HENT:  ?

## 2021-11-15 NOTE — Assessment & Plan Note (Addendum)
Patient presented for the evaluation of left lateral chest wall pain and imaging showed small left pleural effusion with possible pneumothorax. ?IR consulted, patient underwent pigtail insertion.  Tolerated well. ?Chest x-ray shows well-positioned left leg tail thoracostomy tube with resolution of previously demonstrated pneumothorax. ?General surgery following recommended to continue chest tube to suction. ?Repeat chest x-ray showed persistent tiny left apical pneumothorax. ?Plan for repeat chest x-ray in a.m. if pneumothorax improved, chest tube can be removed. ?

## 2021-11-15 NOTE — Assessment & Plan Note (Addendum)
BP remains stable. Continue amlodipine ?

## 2021-11-15 NOTE — Plan of Care (Signed)
?  Problem: Education: ?Goal: Knowledge of General Education information will improve ?Description: Including pain rating scale, medication(s)/side effects and non-pharmacologic comfort measures ?Outcome: Progressing ?  ?Problem: Clinical Measurements: ?Goal: Ability to maintain clinical measurements within normal limits will improve ?Outcome: Progressing ?Goal: Will remain free from infection ?Outcome: Progressing ?Goal: Cardiovascular complication will be avoided ?Outcome: Progressing ?  ?

## 2021-11-15 NOTE — ED Provider Notes (Signed)
? ?[facility] ?Provider Note ? ? ? Event Date/Time  ? First MD Initiated Contact with Patient 11/15/21 [id number]   ?  (approximate) ? ?History  ? ?Chief Complaint: Fall ? ?HPI ? ?[name] is a 81 y.o. female with a past medical history as CHF, atrial fibrillation on Eliquis, presents to the emergency department for left-sided chest pain.  According to the patient she had a fall on Thursday since that time she has been experiencing significant pain in the left chest with occasionally worse spasms.  Patient states pain with any palpation of the left chest or any moving of the left arm.  Also states mild shortness of breath which she relates more to the pain.  No vomiting no diaphoresis.  Patient did not hit her head, no LOC.  No neck pain.  No other pain. ? ?Physical Exam  ? ?Triage Vital Signs: ?ED Triage Vitals  ?Enc Vitals Group  ?   BP --   ?   Pulse --   ?   Resp --   ?   Temp --   ?   Temp src --   ?   SpO2 --   ?   Weight 11/15/21 0901 118 lb (53.5 kg)  ?   Height 11/15/21 0901 '5\' 10"'$  (1.778 m)  ?   Head Circumference --   ?   Peak Flow --   ?   Pain Score 11/15/21 0900 5  ?   Pain Loc --   ?   Pain Edu? --   ?   Excl. in [place]? --   ? ? ?Most recent vital signs: ?There were no vitals filed for this visit. ? ?General: Awake, no distress.  ?CV:  Good peripheral perfusion.  Regular rate and rhythm  ?Resp:  Normal effort.  Equal breath sounds bilaterally.  Moderate to significant left lateral chest wall tenderness to palpation. ?Abd:  No distention.  Soft, nontender.  No rebound or guarding.  Benign abdomen. ?Other:  Range of motion in lower extremities.  Pain in the chest with range of motion of left upper extremity. ? ? ?ED Results / Procedures / Treatments  ? ?EKG ? ?EKG viewed and interpreted by myself shows normal sinus rhythm at 67 bpm with a narrow QRS, normal axis, normal intervals, no concerning ST changes. ? ?RADIOLOGY ? ?I personally reviewed the CT images appears to have a  small left-sided pneumothorax. ?Radiology has read the CT is acute fracture of the fifth through seventh ribs on the left with a small anterior left pneumothorax with left lower lobe collapse small pleural effusion. ? ? ?MEDICATIONS ORDERED IN ED: ?Medications - No data to display ? ? ?IMPRESSION / MDM / ASSESSMENT AND PLAN / ED COURSE  ?I reviewed the triage vital signs and the nursing notes. ? ?Patient presents to the emergency department the fall 2 days ago with worsening pain in the left lateral chest worse with movement or palpation.  Patient also states mild shortness of breath which she relates due to the pain into the chest.  Moderate tenderness on my evaluation concerning for likely rib fracture.  Differential would also include pneumothorax, rib contusion, less likely ACS.  We will check labs including cardiac enzymes and an EKG as a precaution.  We will obtain CT imaging of the chest to further evaluate.  Given the patient's fall with Eliquis we will also obtain CT scan of the head as a precaution. ? ?I personally reviewed the  CT images appears to have a small left-sided pneumothorax.  I spoke to Dr. Christian Mate of general surgery.  Patient is on Eliquis, given the size of pneumothorax he does recommend trying to have interventional radiology place a chest tube.  He states he will manage the chest tube given the patient's other medical problems I believe the patient would be best managed by the hospital service.  We will discuss with interventional radiology for possible tube placement.  We will continue to treat the patient's discomfort. ? ?I spoke with interventional radiology.  He is currently in a procedure but will review the patient's CT after the procedure to see if they can place a small chest tube.  We will admit to the hospitalist service. ? ?FINAL CLINICAL IMPRESSION(S) / ED DIAGNOSES  ? ?Chest wall pain ?Pneumothorax ?Rib fractures ? ?Note:  This document was prepared using Dragon voice  recognition software and may include unintentional dictation errors. ?  ?Harvest Dark, MD ?11/15/21 1139 ? ?

## 2021-11-15 NOTE — ED Triage Notes (Signed)
Pt via EMS from home. Pt had a mechanical fall on Thursday. Pt fell on her L side since then she has been having L sided rib pain that comes and goes. Pain with inspiration and also endorses chest heaviness. Pt is on Eliquis. Denies LOC. Denies head injury. Pt is A&Ox4 and NAD.  ?

## 2021-11-15 NOTE — ED Notes (Signed)
Report accepted by floor nurse and ED float nurse on way to transport pt. ?

## 2021-11-15 NOTE — ED Notes (Signed)
Patient transported to CT 

## 2021-11-15 NOTE — H&P (Signed)
?History and Physical  ? ? ?Patient: [name] WCB:[id number] DOB: 22-May-1941 ?DOA: 11/15/2021 ?DOS: the patient was seen and examined on 11/15/2021 ?PCP: [name], MD  ?Patient coming from: Home ? ?Chief Complaint:  ?Chief Complaint  ?Patient presents with  ? Fall  ? ?HPI: [name] is a 81 y.o. female with medical history significant for chronic diastolic dysfunction CHF, history of A-fib on anticoagulation therapy, history of left breast cancer, hypertension and GERD who presents to the emergency for evaluation left sided chest pain.  She rates her pain an 8 x 10 in intensity at its worst and pain is aggravated by inspiration.  She also has severe muscle spasms associated with the pain. ?Patient states that she fell 2 days prior to presentation.  It sounds like a mechanical fall as she had leaned over to pick up a phone and fell on her left side landing on the couch.  She developed pain in her left lateral chest wall since the fall and has been taking Tylenol at home without any significant improvement in her pain. ?She denied feeling dizzy or lightheaded prior to the fall.  She denies having any nausea, no vomiting, no palpitations, no diaphoresis, no headache, no cough, no fever, no chills, no urinary symptoms, no changes in her bowel habits, no blurred vision or any focal deficit. ?She was discharged from the hospital about a week ago for an admission for rapid atrial fibrillation. ?Review of Systems: As mentioned in the history of present illness. All other systems reviewed and are negative. ?Past Medical History:  ?Diagnosis Date  ? (HFpEF) heart failure with preserved ejection fraction ([place])   ? a. 08/2017 Echo: EF 55-60%, no rwma, mild MR, mildly dil LA, nl RV fxn; b. 03/2020 Echo: EF 60-65%, no rwma, Gr2 DD. RVSP 44.12mHg. Mod dil LA. Mild MR; c. 09/2020 Echo: EF 50-55%, no rwma, mild LVH, mod red RV fxn, mildly dily RA.  ? Acute CHF (congestive heart failure) ([place] 09/18/2020  ?  Alcohol abuse 04/05/2020  ? Breast cancer ([place] 2001  ? left breast  ? Cancer ([place] 2001  ? left breast ca  ? Carotid arterial disease ([place]   ? a. 10/2004 s/p L CEA; b. 12/2015 Carotid U/S: RICA 1-39%; b. LICA patent CEA site.  ? Closed right hip fracture ([place] 09/20/2019  ? GERD (gastroesophageal reflux disease)   ? Hyperlipidemia   ? Hypertension   ? Osteoarthritis, multiple sites   ? Osteopenia   ? Persistent atrial fibrillation ([place] [place]   ? a. Dx 08/2017; b. CHA2DS2VASc = 6-->Pradaxa; c. 09/2017 Successful DCCV (second shock - 200J); d. 10/2017 Recurrent Afib-->flecainide started 11/2017; e. 06/2020 s/p DCCV (200J x 1); f. 06/2020 Recurrent AF->Flec inc 75bid; g. 09/2020 WCT->flec d/c'd->amio started.  ? Personal history of radiation therapy 2001  ? left breast ca  ? Psoriasis   ? Wide-complex tachycardia   ? a. 09/2020 in setting of presumed Flecainide toxicity.  Flecainide d/c'd.  ? ?Past Surgical History:  ?Procedure Laterality Date  ? ABDOMINAL HYSTERECTOMY    ? ANKLE FRACTURE SURGERY  4/08  ? left---hardware still in place  ? BREAST BIOPSY Left 2001  ? breast ca  ? BREAST EXCISIONAL BIOPSY Left yrs ago  ? benign  ? BREAST LUMPECTOMY Left 2001  ? f/u radiation  ? CARDIOVERSION N/A 10/04/2017  ? Procedure: CARDIOVERSION;  Surgeon: [name] MD;  Location: [facility] ORS;  Service: Cardiovascular;  Laterality: N/A;  ? CARDIOVERSION N/A 12/16/2017  ?  Procedure: CARDIOVERSION;  Surgeon: [name], MD;  Location: [facility] ORS;  Service: Cardiovascular;  Laterality: N/A;  ? CARDIOVERSION N/A 06/24/2020  ? Procedure: CARDIOVERSION;  Surgeon: Wellington Hampshire, MD;  Location: [facility] ORS;  Service: Cardiovascular;  Laterality: N/A;  ? CAROTID ENDARTERECTOMY Left 10/23/2004  ? FRACTURE SURGERY    ? OOPHORECTOMY    ? SHOULDER SURGERY  6/07  ? left  ? TONSILLECTOMY AND ADENOIDECTOMY    ? TOTAL HIP ARTHROPLASTY  2004  ? right  ? ?Social History:  reports that she quit smoking about 26 years ago. Her smoking use included cigarettes. She  has a 40.00 pack-year smoking history. She has never used smokeless tobacco. She reports current alcohol use of about 2.0 standard drinks per week. She reports that she does not use drugs. ? ?No Known Allergies ? ?Family History  ?Problem Relation Age of Onset  ? Hypertension Brother   ? Pneumonia Mother   ? Leukemia Father   ? Heart disease Neg Hx   ? Diabetes Neg Hx   ? Breast cancer Neg Hx   ? ? ?Prior to Admission medications   ?Medication Sig Start Date End Date Taking? Authorizing Provider  ?acetaminophen (TYLENOL) 325 MG tablet Take 2 tablets (650 mg total) by mouth every 4 (four) hours as needed for headache or mild pain. 09/19/20   Swayze, Ava, DO  ?allopurinol (ZYLOPRIM) 100 MG tablet TAKE 1.5 TABLETS ('150MG'$  TOTAL) BY MOUTH DAILY 05/05/21   [name], MD  ?amiodarone (PACERONE) 200 MG tablet TAKE 1 TABLET BY MOUTH EVERY DAY 04/04/21   Kathyrn Drown D, NP  ?amLODipine (NORVASC) 10 MG tablet Take 1 tablet (10 mg total) by mouth daily. 08/01/21   [name], MD  ?apixaban (ELIQUIS) 2.5 MG TABS tablet Take 1 tablet (2.5 mg total) by mouth 2 (two) times daily. 09/09/21   [name], MD  ?cholecalciferol (VITAMIN D) 25 MCG (1000 UNIT) tablet Take 2 tablets (2,000 Units total) by mouth daily. 09/20/20   Swayze, Ava, DO  ?doxycycline (VIBRA-TABS) 100 MG tablet Take 1 tablet (100 mg total) by mouth 2 (two) times daily. 10/31/21   [name], MD  ?furosemide (LASIX) 40 MG tablet Take 0.5 tablet (20 mg) by mouth once daily as needed for swelling 08/14/21   [name], MD  ?LORazepam (ATIVAN) 0.5 MG tablet TAKE 1 TABLET BY MOUTH TWICE A DAY AS NEEDED FOR ANXIETY 09/22/21   Kennyth Arnold, FNP  ?mirtazapine (REMERON) 15 MG tablet TAKE 1 TABLET BY MOUTH EVERYDAY AT BEDTIME 09/22/21   Kennyth Arnold, FNP  ?Multiple Vitamin (MULTIVITAMIN) capsule Take 1 capsule by mouth daily.    [provider]  ?Multiple Vitamins-Minerals (PRESERVISION AREDS 2+MULTI VIT PO) Take 1 capsule by mouth in the  morning and at bedtime.    [provider]  ? ? ?Physical Exam: ?Vitals:  ? 11/15/21 1100 11/15/21 1130 11/15/21 1200 11/15/21 1230  ?BP: (!) 157/75 (!) 157/80 (!) 154/77 (!) 153/80  ?Pulse: 68 72 74 71  ?Resp: (!) 23 (!) '25 16 18  '$ ?Temp:      ?TempSrc:      ?SpO2: 98% 99% 96% 96%  ?Weight:      ?Height:      ? ?Physical Exam ?Vitals and nursing note reviewed.  ?Constitutional:   ?   Comments: thin  ?HENT:  ?   Head: Normocephalic and atraumatic.  ?   Nose: Nose normal.  ?   Mouth/Throat:  ?  Mouth: Mucous membranes are moist.  ?Eyes:  ?   Pupils: Pupils are equal, round, and reactive to light.  ?Cardiovascular:  ?   Rate and Rhythm: Normal rate and regular rhythm.  ?Pulmonary:  ?   Effort: Pulmonary effort is normal.  ?   Comments: Decreased breath sounds on the left.  Tenderness over the left lateral chest wall ?Chest:  ?   Chest wall: Tenderness present.  ?Abdominal:  ?   General: Abdomen is flat. Bowel sounds are normal.  ?   Palpations: Abdomen is soft.  ?Musculoskeletal:     ?   General: Normal range of motion.  ?   Cervical back: Normal range of motion and neck supple.  ?Skin: ?   General: Skin is warm and dry.  ?Neurological:  ?   General: No focal deficit present.  ?   Mental Status: She is alert and oriented to person, place, and time.  ?Psychiatric:     ?   Mood and Affect: Mood normal.     ?   Behavior: Behavior normal.  ? ? ?Data Reviewed: ?Relevant notes from primary care and specialist visits, past discharge summaries as available in EHR, including Care Everywhere. ?Prior diagnostic testing as pertinent to current admission diagnoses ?Updated medications and problem lists for reconciliation ?ED course, including vitals, labs, imaging, treatment and response to treatment ?Triage notes, nursing and pharmacy notes and ED provider's notes ?Notable results as noted in HPI ?Labs reviewed.  Sodium 137, potassium 3.7, chloride 98, bicarb 29, glucose 118, BUN 22, creatinine 1.02, calcium 9.4,  troponin 8, white count 8.3, hemoglobin 11.8, hematocrit 36.1, MCV one 1.7, RDW 14.2, platelet count 271 ?CT scan of the brain without contrast shows no acute intracranial hemorrhage, mass effect, or ?edema. ?CT

## 2021-11-15 NOTE — Procedures (Signed)
Vascular and Interventional Radiology Procedure Note ? ?Patient: [name] ?DOB: [birth year] ?Medical Record Number: [id number] ?Note Date/Time: 11/15/21 4:54 PM  ? ?Performing Physician: Michaelle Birks, MD ?Assistant(s): None ? ?Diagnosis: Fall with rib fxs. LEFT hydroPTX. ? ?Procedure: LEFT PIGTAIL THORACOSTOMY TUBE PLACEMENT  ? ?Anesthesia: Local Anesthetic and Single Agent Sedation ?Complications: None ?Estimated Blood Loss: Minimal ?Specimens: Sent for None ? ?Findings:  ?Successful CT-guided placement of 12 F pigtail chest tube into LEFT chest. ? ?Plan:  ?- Maintain chest tube to -20 cm H20 ?- CXR in AM ? ?See detailed procedure note with images in PACS. ?The patient tolerated the procedure well without incident or complication and was returned to Floor Bed in stable condition.  ? ? ?Michaelle Birks, MD ?[facility] ?[facility] ? ? ?Pager. [phone] ?Clinic. [phone]  ?

## 2021-11-15 NOTE — ED Notes (Signed)
Pt assisted to switch from ED stretcher to [place] bed; pt has extreme pain with any change of position. Pt repositioned in bed to encourage comfort. Pt given call bell; bed locked low; rail up.  ?

## 2021-11-15 NOTE — ED Notes (Signed)
Pt assisted back to bed

## 2021-11-15 NOTE — Assessment & Plan Note (Addendum)
Heart rate well controlled. ?Continue amiodarone. ?Hold Eliquis given pigtail tube thoracostomy. ? ?

## 2021-11-15 NOTE — ED Notes (Signed)
Messaged pharm in hopes to get meds verified. Will give pt's meds once verified.  ?

## 2021-11-15 NOTE — Assessment & Plan Note (Addendum)
Renal functions at baseline. ?Avoid nephrotoxic medications ?

## 2021-11-15 NOTE — Assessment & Plan Note (Addendum)
Continue Remeron and lorazepam. ?

## 2021-11-15 NOTE — ED Notes (Signed)
Pt updated; pt reports pain unchanged since morphine; attending [place] notified pt requesting to try something else for pain.  ?

## 2021-11-15 NOTE — ED Notes (Signed)
Pt assisted to toilet, min assist x1 ?

## 2021-11-15 NOTE — Assessment & Plan Note (Addendum)
Patient presented with left-sided chest wall pain,  status post mechanical fall 2 days ago at home.Marland Kitchen ?Imaging shows acute fractures of the posterolateral left fifth through seventh ribs. Pneumothorax. ?Adequate pain control, muscle relaxants. ?Continuous incentive spirometry, fall precautions. ? ?

## 2021-11-15 NOTE — TOC Initial Note (Signed)
Transition of Care (TOC) - Initial/Assessment Note  ? ? ?Patient Details  ?Name: [name] ?MRN: [id number] ?Date of Birth: 1940/12/30 ? ?Transition of Care (TOC) CM/SW Contact:    ?Weldon, LCSWA ?Phone Number: ?11/15/2021, 12:42 PM ? ?Clinical Narrative:                 ? ?TOC consult for substance use. CSW attached substance use resources to AVS due to patient being in isolation contact precautions.  ? ?No further TOC needs at this time. ?  ?  ? ? ?Patient Goals and CMS Choice ?  ?  ?  ? ?Expected Discharge Plan and Services ?  ?  ?  ?  ?  ?                ?  ?  ?  ?  ?  ?  ?  ?  ?  ?  ? ?Prior Living Arrangements/Services ?  ?  ?  ?       ?  ?  ?  ?  ? ?Activities of Daily Living ?  ?  ? ?Permission Sought/Granted ?  ?  ?   ?   ?   ?   ? ?Emotional Assessment ?  ?  ?  ?  ?  ?  ? ?Admission diagnosis:  Multiple rib fractures [S22.49XA] ?Patient Active Problem List  ? Diagnosis Date Noted  ? Multiple rib fractures 11/15/2021  ? Pneumothorax 11/15/2021  ? Chronic atrial fibrillation with RVR ([place]) 11/05/2021  ? Depression with anxiety 11/05/2021  ? Chronic diastolic CHF (congestive heart failure) ([place]) 11/05/2021  ? Chronic kidney disease, stage 3a ([place]) 11/05/2021  ? Boil of lower extremity-left ankle 11/05/2021  ? Alcohol use 11/05/2021  ? Hypomagnesemia 11/05/2021  ? Ankle wound, left, subsequent encounter 08/19/2021  ? Hyperparathyroidism ([place]) 02/17/2021  ? Slow heart beat 09/22/2020  ? CHF (congestive heart failure) ([place]) 09/19/2020  ? Arrhythmia 09/19/2020  ? VT (ventricular tachycardia) ([place]) 09/19/2020  ? Ventricular tachycardia ([place]) 09/19/2020  ? Acute CHF (congestive heart failure) ([place]) 09/18/2020  ? Troponin level elevated 09/18/2020  ? Itching 09/05/2020  ? Coagulation defect ([place]) 08/12/2020  ? 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  ? Chronic neck pain 04/15/2020  ? Chronic kidney disease, stage 3, mod decreased GFR (HCC) 04/05/2020  ? DOE (dyspnea on  exertion) 02/09/2020  ? Anxiety 01/23/2020  ? Gout 05/18/2019  ? Nodule of finger, bilateral 03/22/2019  ? Clavi 03/09/2019  ? Chronic bilateral low back pain without sciatica 12/31/2017  ? Atrial fibrillation ([place]) 09/10/2017  ? Depression, major, single episode, mild ([place]) 09/10/2017  ? Chronic venous insufficiency 09/09/2017  ? Lymphedema 09/09/2017  ? Dry eyes 07/06/2017  ? Pain and swelling of left ankle 03/29/2017  ? Fall 05/01/2016  ? Leg weakness, bilateral 12/30/2015  ? Anemia of chronic disease 05/19/2013  ? Psoriatic arthritis ([place]) 02/03/2013  ? Psoriasis   ? Osteoarthritis, multiple sites   ? History of breast cancer 10/28/2011  ? GERD (gastroesophageal reflux disease)   ? HTN (hypertension)   ? Osteopenia   ? Carotid stenosis 05/27/2011  ? Occlusion and stenosis of carotid artery without mention of cerebral infarction 05/27/2011  ? ?PCP:  [name], MD ?Pharmacy:   ?[id number] [address] [id number]?Phone: [phone]: [phone]? ? ? ? ?Social Determinants of Health (SDOH) Interventions ?  ? ?Readmission Risk Interventions ?   ?  View : No data to display.  ?  ?  ?  ? ? ? ?

## 2021-11-15 NOTE — ED Notes (Signed)
Pt placed on 2L via [place] as shallow breathing causing pt's sats to dec to 88% RA; pt prompted to take deeper breaths. ?

## 2021-11-16 ENCOUNTER — Inpatient Hospital Stay: Payer: Medicare Other

## 2021-11-16 DIAGNOSIS — S2242XA Multiple fractures of ribs, left side, initial encounter for closed fracture: Secondary | ICD-10-CM | POA: Diagnosis not present

## 2021-11-16 DIAGNOSIS — S270XXA Traumatic pneumothorax, initial encounter: Secondary | ICD-10-CM | POA: Diagnosis not present

## 2021-11-16 DIAGNOSIS — S2242XK Multiple fractures of ribs, left side, subsequent encounter for fracture with nonunion: Secondary | ICD-10-CM | POA: Diagnosis not present

## 2021-11-16 LAB — BASIC METABOLIC PANEL
Anion gap: 8 (ref 5–15)
BUN: 16 mg/dL (ref 8–23)
CO2: 27 mmol/L (ref 22–32)
Calcium: 8.8 mg/dL — ABNORMAL LOW (ref 8.9–10.3)
Chloride: 103 mmol/L (ref 98–111)
Creatinine, Ser: 0.85 mg/dL (ref 0.44–1.00)
GFR, Estimated: >60 mL/min (ref >60)
Glucose, Bld: 84 mg/dL (ref 70–99)
Potassium: 4 mmol/L (ref 3.5–5.1)
Sodium: 138 mmol/L (ref 135–145)

## 2021-11-16 LAB — CBC
HCT: 33 % — ABNORMAL LOW (ref 36.0–46.0)
Hemoglobin: 10.7 g/dL — ABNORMAL LOW (ref 12.0–15.0)
MCH: 33.3 pg (ref 26.0–34.0)
MCHC: 32.4 g/dL (ref 30.0–36.0)
MCV: 102.8 fL — ABNORMAL HIGH (ref 80.0–100.0)
Platelets: 246 10*3/uL (ref 150–400)
RBC: 3.21 MIL/uL — ABNORMAL LOW (ref 3.87–5.11)
RDW: 14.3 % (ref 11.5–15.5)
WBC: 6.5 10*3/uL (ref 4.0–10.5)
nRBC: 0 % (ref 0.0–0.2)

## 2021-11-16 NOTE — Progress Notes (Signed)
?Progress Note ? ? ?Patient: [name] CXK:[id number] DOB: 1941/06/18 DOA: 11/15/2021     1 ? ?DOS: the patient was seen and examined on 11/16/2021 ?  ?Brief hospital course: ?This 81 years old female with PMH significant for chronic diastolic CHF, history of A-fib on anticoagulation therapy, history of breast cancer, hypertension, GERD presented in the ED for the evaluation of left-sided chest pain.  Patient describes the chest pain is severe 8/10 on pain intensity and aggravated with deep inspiration.  Patient also reported having associated muscle spasms. Patient reported 2 days ago she fell, it sounds like a mechanical fall as she leaned over to pick up her phone and fell on the left side,  landed on couch. She has been taking Tylenol without improvement.  Patient denies any dizziness, palpitations, diaphoresis.  Patient was recently hospitalized for A-fib with RVR and was discharged on Eliquis.  Work-up in the ED reveals multiple rib fractures and pneumothorax requiring chest tube insertion. ? ?Assessment and Plan: ?* Multiple rib fractures ?Patient presented with left-sided chest wall pain,  status post mechanical fall 2 days ago at home.Marland Kitchen ?Imaging shows acute fractures of the posterolateral left fifth through seventh ribs. Pneumothorax. ?Adequate pain control, muscle relaxants. ?Continuous incentive spirometry, fall precautions ? ? ?Pneumothorax ?Patient presented for the evaluation of left lateral chest wall pain and imaging shows small left pleural effusion with possible pneumothorax. ?IR consulted, patient underwent pigtail insertion.  Tolerated well. ?Chest x-ray shows well-positioned left leg tail thoracostomy tube with resolution of previously demonstrated pneumothorax ? ?Chronic kidney disease, stage 3a ([place]) ?Renal functions at baseline. ?Avoid nephrotoxic medications ? ?Depression with anxiety ?Continue Remeron and lorazepam. ? ?Atrial fibrillation ([place]) ?Heart rate well controlled. ?Continue  amiodarone. ?Hold Eliquis given pigtail tube thoracostomy. ? ? ?HTN (hypertension) ?BP remains stable. Continue amlodipine ? ? ?Subjective: Patient was seen and examined at bedside.  Overnight events noted. ?She reports feeling better,  reports having chest pain, underwent pigtail thoracostomy tube insertion.  Tolerated well, chest x-ray shows resolution of pneumothorax. ? ?Physical Exam: ?Vitals:  ? 11/15/21 2306 11/16/21 0404 11/16/21 0745 11/16/21 1148  ?BP: (!) 151/78 (!) 154/69 (!) 146/73 118/62  ?Pulse: 66 68 70 70  ?Resp: '20 20 19   '$ ?Temp: 98 ?F (36.7 ?C) 98.4 ?F (36.9 ?C) 98.1 ?F (36.7 ?C) 98 ?F (36.7 ?C)  ?TempSrc: Oral Oral Oral   ?SpO2: 96% 98% 93% 91%  ?Weight:      ?Height:      ? ?General exam: Appears comfortable, deconditioned, not in any distress. ?Respiratory system: CTA bilaterally, No wheezing, No crackles, Normal respiratory effort. ?Chest wall: Pigtail catheter noted in the left chest with good seal. ?Cardiovascular system: S1-S2 heard, regular rate and rhythm, no murmur. ?Gastrointestinal system: Abdominal soft, nontender, nondistended, BS+ ?Central nervous system: Alert, oriented x3, no focal neurological deficits. ?Extremities: No edema, no cyanosis, no clubbing. ?Psychiatry: Mood, insight, judgment appropriate ? ?Data Reviewed: ?I have Reviewed nursing notes, Vitals, and Lab results since pt's last encounter. Pertinent lab results CBC, BMP ?I have ordered test including CBC, BMP ?I have independently visualized and interpreted imaging chest x-ray which showed pigtail catheter, resolution of previously demonstrated pneumothorax. ?I have reviewed the last note from hospitalist, IR,  ?I have discussed pt's care plan and test results with patient and the daughter.  ? ?Family Communication: Daughter at bedside ? ?Disposition: ?Status is: Inpatient ?Remains inpatient appropriate because: Admitted for left-sided chest wall pain found to have multiple rib fractures and left pneumothorax ,  she  underwent pigtail catheter placement. ? ? Planned Discharge Destination: Home ? ? ? ?Time spent: 50 minutes ? ?Author: ?Shawna Clamp, MD ?11/16/2021 1:26 PM ? ?For on call review [email or web address].  ?

## 2021-11-16 NOTE — Progress Notes (Signed)
[facility] ?SURGICAL PROGRESS NOTE (cpt [phone]) ? ?Hospital Day(s): 1.  ? ?Post op day(s):  .  ? ?Interval History: Patient seen and examined, no acute events or new complaints overnight. Patient reports improved pain control, denies shortness of breath.  Chest x-ray shows reexpansion of the left lung.  Chest tube with over 300 mL of fluid output.  No evidence of air leak. ? ?Review of Systems:  ?Constitutional: denies fever, chills  ?HEENT: denies cough or congestion  ?Respiratory: denies any shortness of breath  ?Cardiovascular: denies chest pain or palpitations  ?Gastrointestinal: denies abdominal pain, N/V, or diarrhea/and bowel function as per interval history ?Genitourinary: denies burning with urination or urinary frequency ?Musculoskeletal: denies pain, decreased motor or sensation ?Integumentary: denies any other rashes or skin discolorations ?Neurological: denies HA or vision/hearing changes  ? ?Vital signs in last 24 hours: [min-max] current  ?Temp:  [98 ?F (36.7 ?C)-98.4 ?F (36.9 ?C)] 98 ?F (36.7 ?C) (04/30 1148) ?Pulse Rate:  [62-79] 70 (04/30 1148) ?Resp:  [16-22] 19 (04/30 0745) ?BP: (118-180)/(62-99) 118/62 (04/30 1148) ?SpO2:  [91 %-100 %] 91 % (04/30 1148)     Height: '5\' 10"'$  (177.8 cm) Weight: 53.5 kg BMI (Calculated): 16.93  ? ?Intake/Output last 2 shifts:  ?04/29 0701 - 04/30 0700 ?In: -  ?Out: 330 [Chest Tube:330]  ? ?Physical Exam:  ?Constitutional: alert, cooperative and no distress  ?HENT: normocephalic without obvious abnormality  ?Eyes: PERRL, EOM's grossly intact and symmetric  ?Neuro: CN II - XII grossly intact and symmetric without deficit  ?Respiratory: breathing non-labored at rest  ?Cardiovascular: regular rate and sinus rhythm  ?Gastrointestinal: soft, non-tender, and non-distended ?Musculoskeletal: no edema or wounds, motor and sensation grossly intact, NT  ? ? ?Labs:  ? ?  Latest Ref Rng & Units 11/16/2021  ?  4:43 AM 11/15/2021  ?  9:05 AM 11/06/2021  ?  8:39 AM   ?CBC  ?WBC 4.0 - 10.5 K/uL 6.5   8.3   5.8    ?Hemoglobin 12.0 - 15.0 g/dL 10.7   11.8   13.3    ?Hematocrit 36.0 - 46.0 % 33.0   36.1   39.3    ?Platelets 150 - 400 K/uL 246   271   277    ? ? ?  Latest Ref Rng & Units 11/16/2021  ?  4:43 AM 11/15/2021  ?  9:05 AM 11/06/2021  ?  8:39 AM  ?CMP  ?Glucose 70 - 99 mg/dL 84   118   95    ?BUN 8 - 23 mg/dL '16   22   27    '$ ?Creatinine 0.44 - 1.00 mg/dL 0.85   1.02   1.12    ?Sodium 135 - 145 mmol/L 138   137   136    ?Potassium 3.5 - 5.1 mmol/L 4.0   3.7   3.6    ?Chloride 98 - 111 mmol/L 103   98   95    ?CO2 22 - 32 mmol/L '27   29   29    '$ ?Calcium 8.9 - 10.3 mg/dL 8.8   9.4   9.9    ? ? ? ?Imaging studies:  ?CLINICAL DATA:  Pneumothorax.  Recent chest tube placement. ?  ?EXAM: ?PORTABLE CHEST 1 VIEW ?  ?COMPARISON:  IR CT, chest XR and CT chest, 11/15/2021. ?  ?FINDINGS: ?Support lines: Apically-oriented LEFT pigtail thoracostomy tube with ?pigtail at the level of the mid chest. Overlying pacer leads. ?  ?Cardiomediastinal silhouette is  unchanged. Aortic atherosclerosis. ?The RIGHT lung is well inflated. Improved inflation of the LEFT lung ?with resolution of previously-demonstrated LEFT pneumothorax. ?Persistent LEFT basilar consolidation with trace residual fluid. ?Multilevel, LEFT-sided rib fractures. No interval osseous ?abnormality. ?  ?IMPRESSION: ?1. Well-positioned LEFT pigtail thoracostomy tube with resolution of ?previously demonstrated pneumothorax. ?2. LEFT basilar consolidation and trace residual pleural fluid, ?favored consistent with atelectasis and hemothorax. ?  ?  ?Electronically Signed ?  By: Michaelle Birks M.D. ?  On: 11/16/2021 08:49 ? ?Assessment/Plan: 81 y.o. female with traumatic left pneumothorax   s/p IR placement of pigtail left chest tube for same, complicated by pertinent comorbidities including. ?Patient Active Problem List  ? Diagnosis Date Noted  ? Multiple rib fractures 11/15/2021  ? Pneumothorax 11/15/2021  ? Chronic atrial fibrillation  with RVR ([place]) 11/05/2021  ? Depression with anxiety 11/05/2021  ? Chronic diastolic CHF (congestive heart failure) ([place]) 11/05/2021  ? Chronic kidney disease, stage 3a ([place]) 11/05/2021  ? Boil of lower extremity-left ankle 11/05/2021  ? Alcohol use 11/05/2021  ? Hypomagnesemia 11/05/2021  ? Ankle wound, left, subsequent encounter 08/19/2021  ? Hyperparathyroidism ([place]) 02/17/2021  ? Slow heart beat 09/22/2020  ? CHF (congestive heart failure) ([place]) 09/19/2020  ? Arrhythmia 09/19/2020  ? VT (ventricular tachycardia) ([place]) 09/19/2020  ? Ventricular tachycardia ([place]) 09/19/2020  ? Acute CHF (congestive heart failure) ([place]) 09/18/2020  ? Troponin level elevated 09/18/2020  ? Itching 09/05/2020  ? Coagulation defect ([place]) 08/12/2020  ? 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  ? Chronic neck pain 04/15/2020  ? Chronic kidney disease, stage 3, mod decreased GFR (HCC) 04/05/2020  ? DOE (dyspnea on exertion) 02/09/2020  ? Anxiety 01/23/2020  ? Gout 05/18/2019  ? Nodule of finger, bilateral 03/22/2019  ? Clavi 03/09/2019  ? Chronic bilateral low back pain without sciatica 12/31/2017  ? Atrial fibrillation ([place]) 09/10/2017  ? Depression, major, single episode, mild ([place]) 09/10/2017  ? Chronic venous insufficiency 09/09/2017  ? Lymphedema 09/09/2017  ? Dry eyes 07/06/2017  ? Pain and swelling of left ankle 03/29/2017  ? Fall 05/01/2016  ? Leg weakness, bilateral 12/30/2015  ? Anemia of chronic disease 05/19/2013  ? Psoriatic arthritis ([place]) 02/03/2013  ? Psoriasis   ? Osteoarthritis, multiple sites   ? History of breast cancer 10/28/2011  ? GERD (gastroesophageal reflux disease)   ? HTN (hypertension)   ? Osteopenia   ? Carotid stenosis 05/27/2011  ? Occlusion and stenosis of carotid artery without mention of cerebral infarction 05/27/2011  ? ? ? -Would continue left chest tube to suction, due to serosanguineous drainage.  Expectant follow-up chest x-ray in a.m., and anticipate transition to waterseal  at that time. ? -Appreciate hospitalist service and excellent pain control. ? ?All of the above findings and recommendations were discussed with the patient and the medical team, and all of patient's questions were answered to her expressed satisfaction. ? ? ?-- ?Ronny Bacon M.D., FACS ?11/16/2021 2:05 PM  ?

## 2021-11-16 NOTE — Hospital Course (Addendum)
This 81 years old female with PMH significant for chronic diastolic CHF, history of A-fib on anticoagulation therapy, history of breast cancer, hypertension, GERD presented in the ED for the evaluation of left-sided chest pain.  Patient describes the chest pain is severe 8/10 on pain intensity and aggravated with deep inspiration.  Patient also reported having associated muscle spasms. Patient reported 2 days ago she fell, it sounds like a mechanical fall as she leaned over to pick up her phone and fell on the left side,  landed on couch. She has been taking Tylenol without improvement.  Patient denies any dizziness, palpitations, diaphoresis.  Patient was recently hospitalized for A-fib with RVR and was discharged on Eliquis.  Work-up in the ED reveals multiple rib fractures and pneumothorax requiring chest tube insertion.  General surgery is following.  Recommended to continue chest tube to suction. ?

## 2021-11-17 ENCOUNTER — Inpatient Hospital Stay: Payer: Medicare Other

## 2021-11-17 ENCOUNTER — Encounter: Payer: Self-pay | Admitting: Internal Medicine

## 2021-11-17 ENCOUNTER — Ambulatory Visit: Payer: Medicare Other | Admitting: Internal Medicine

## 2021-11-17 DIAGNOSIS — S270XXA Traumatic pneumothorax, initial encounter: Secondary | ICD-10-CM | POA: Diagnosis not present

## 2021-11-17 DIAGNOSIS — I10 Essential (primary) hypertension: Secondary | ICD-10-CM

## 2021-11-17 DIAGNOSIS — S2242XK Multiple fractures of ribs, left side, subsequent encounter for fracture with nonunion: Secondary | ICD-10-CM | POA: Diagnosis not present

## 2021-11-17 DIAGNOSIS — I4819 Other persistent atrial fibrillation: Secondary | ICD-10-CM

## 2021-11-17 DIAGNOSIS — Z79899 Other long term (current) drug therapy: Secondary | ICD-10-CM

## 2021-11-17 DIAGNOSIS — S2242XA Multiple fractures of ribs, left side, initial encounter for closed fracture: Secondary | ICD-10-CM | POA: Diagnosis not present

## 2021-11-17 DIAGNOSIS — R001 Bradycardia, unspecified: Secondary | ICD-10-CM

## 2021-11-17 LAB — CBC
HCT: 31.5 % — ABNORMAL LOW (ref 36.0–46.0)
Hemoglobin: 10.6 g/dL — ABNORMAL LOW (ref 12.0–15.0)
MCH: 34.9 pg — ABNORMAL HIGH (ref 26.0–34.0)
MCHC: 33.7 g/dL (ref 30.0–36.0)
MCV: 103.6 fL — ABNORMAL HIGH (ref 80.0–100.0)
Platelets: 251 10*3/uL (ref 150–400)
RBC: 3.04 MIL/uL — ABNORMAL LOW (ref 3.87–5.11)
RDW: 14.2 % (ref 11.5–15.5)
WBC: 7.2 10*3/uL (ref 4.0–10.5)
nRBC: 0 % (ref 0.0–0.2)

## 2021-11-17 LAB — BASIC METABOLIC PANEL
Anion gap: 5 (ref 5–15)
BUN: 22 mg/dL (ref 8–23)
CO2: 29 mmol/L (ref 22–32)
Calcium: 8.7 mg/dL — ABNORMAL LOW (ref 8.9–10.3)
Chloride: 103 mmol/L (ref 98–111)
Creatinine, Ser: 0.94 mg/dL (ref 0.44–1.00)
GFR, Estimated: >60 mL/min (ref >60)
Glucose, Bld: 100 mg/dL — ABNORMAL HIGH (ref 70–99)
Potassium: 3.9 mmol/L (ref 3.5–5.1)
Sodium: 137 mmol/L (ref 135–145)

## 2021-11-17 MED ORDER — OXYCODONE HCL 5 MG PO TABS
5.0000 mg | ORAL_TABLET | Freq: Four times a day (QID) | ORAL | Status: DC | PRN
  Administered 2021-11-17 – 2021-11-20 (×10): 5 mg via ORAL
  Filled 2021-11-17 (×10): qty 1

## 2021-11-17 MED ORDER — FUROSEMIDE 10 MG/ML IJ SOLN
INTRAMUSCULAR | Status: AC
  Filled 2021-11-17: qty 4

## 2021-11-17 NOTE — Progress Notes (Signed)
[facility] ?SURGICAL PROGRESS NOTE (cpt [phone]) ? ?Hospital Day(s): 2. ? ?Interval History: Patient seen and examined, no acute events or new complaints overnight. Patient reports she is more emotionally struggling this morning just with everything going on. She has some chest pain at chest tube site. No SOB. No fever, chills, denies. Labs are reassuring. CXR pending. Chest tube with 170 ccs out; serous. No air leak.  ? ?Review of Systems:  ?Constitutional: denies fever, chills  ?HEENT: denies cough or congestion  ?Respiratory: denies any shortness of breath  ?Cardiovascular: + chest pain (chest tube); denied palpitations  ?Genitourinary: denies burning with urination or urinary frequency ?Musculoskeletal: denies pain, decreased motor or sensation ? ?Vital signs in last 24 hours: [min-max] current  ?Temp:  [98 ?F (36.7 ?C)-99.2 ?F (37.3 ?C)] 98.9 ?F (37.2 ?C) (05/01 0746) ?Pulse Rate:  [65-70] 70 (05/01 0746) ?Resp:  [14-18] 17 (05/01 0746) ?BP: (118-156)/(62-83) 156/67 (05/01 0746) ?SpO2:  [91 %-96 %] 94 % (05/01 0746)     Height: '5\' 10"'$  (177.8 cm) Weight: 53.5 kg BMI (Calculated): 16.93  ? ?Intake/Output last 2 shifts:  ?04/30 0701 - 05/01 0700 ?In: 120 [P.O.:120] ?Out: 470 [Urine:300; Chest Tube:170]  ? ?Physical Exam:  ?Constitutional: alert, cooperative and no distress  ?HENT: normocephalic without obvious abnormality  ?Eyes: PERRL, EOM's grossly intact and symmetric  ?Respiratory: breathing non-labored at rest; on [place]; breath sounds equal throughout; question of slight decrease at left apex ?Cardiovascular: regular rate and sinus rhythm  ?Chest: Chest tube to left lateral chest wall; site CDI; output serous. No appreciable air leak but patient with very weak cough.  ? ? ?Labs:  ? ?  Latest Ref Rng & Units 11/17/2021  ?  4:01 AM 11/16/2021  ?  4:43 AM 11/15/2021  ?  9:05 AM  ?CBC  ?WBC 4.0 - 10.5 K/uL 7.2   6.5   8.3    ?Hemoglobin 12.0 - 15.0 g/dL 10.6   10.7   11.8    ?Hematocrit 36.0 - 46.0 %  31.5   33.0   36.1    ?Platelets 150 - 400 K/uL 251   246   271    ? ? ?  Latest Ref Rng & Units 11/17/2021  ?  4:01 AM 11/16/2021  ?  4:43 AM 11/15/2021  ?  9:05 AM  ?CMP  ?Glucose 70 - 99 mg/dL 100   84   118    ?BUN 8 - 23 mg/dL '22   16   22    '$ ?Creatinine 0.44 - 1.00 mg/dL 0.94   0.85   1.02    ?Sodium 135 - 145 mmol/L 137   138   137    ?Potassium 3.5 - 5.1 mmol/L 3.9   4.0   3.7    ?Chloride 98 - 111 mmol/L 103   103   98    ?CO2 22 - 32 mmol/L '29   27   29    '$ ?Calcium 8.9 - 10.3 mg/dL 8.7   8.8   9.4    ? ? ? ?Imaging studies:  ? ?CXR (11/17/2021) personally reviewed still seems to have small left apical pneumothorax, chest tube in position, and radiologist report still pending ? ?Assessment/Plan: (ICD-10's: J93.9) ?81 y.o. female with traumatic left pneumothorax and rib fractures ? ? - Would continue chest tube to suction today; -20 cm suction. She is okay to come off suction to ambulate, use the restroom, etc. Return to suction when in bed/chair.   ? -  Morning CXR  ? - Pulmonary toilet; IS ?- Pain control prn  ? - Mobilization encouraged as feasible ?- Further management per primary service; we will follow   ? ?All of the above findings and recommendations were discussed with the patient, and the medical team, and all of patient's questions were answered to her expressed satisfaction. ? ?-- ?Edison Simon, PA-C ?[facility] ?11/17/2021, 7:57 AM ?M-F: 7am - 4pm ? ?

## 2021-11-17 NOTE — TOC Initial Note (Signed)
Transition of Care (TOC) - Initial/Assessment Note  ? ? ?Patient Details  ?Name: [name] ?MRN: [id number] ?Date of Birth: [birth year] ? ?Transition of Care (TOC) CM/SW Contact:    ?Laurena Slimmer, RN ?Phone Number: ?11/17/2021, 2:38 PM ? ?Clinical Narrative:                 ? ?Transition of Care (TOC) Screening Note ? ? ?Patient Details  ?Name: [name] ?Date of Birth: Aug 29, 1940 ? ? ?Transition of Care (TOC) CM/SW Contact:    ?Laurena Slimmer, RN ?Phone Number: ?11/17/2021, 2:38 PM ? ? ? ?Transition of Care Department [facility]) has reviewed patient and no TOC needs have been identified at this time. We will continue to monitor patient advancement through interdisciplinary progression rounds. If new patient transition needs arise, please place a TOC consult. ?  ? ?  ?  ? ? ?Patient Goals and CMS Choice ?  ?  ?  ? ?Expected Discharge Plan and Services ?  ?  ?  ?  ?  ?                ?  ?  ?  ?  ?  ?  ?  ?  ?  ?  ? ?Prior Living Arrangements/Services ?  ?  ?  ?       ?  ?  ?  ?  ? ?Activities of Daily Living ?Home Assistive Devices/Equipment: None ?ADL Screening (condition at time of admission) ?Patient's cognitive ability adequate to safely complete daily activities?: Yes ?Is the patient deaf or have difficulty hearing?: No ?Does the patient have difficulty seeing, even when wearing glasses/contacts?: No ?Does the patient have difficulty concentrating, remembering, or making decisions?: No ?Patient able to express need for assistance with ADLs?: Yes ?Does the patient have difficulty dressing or bathing?: No ?Independently performs ADLs?: Yes (appropriate for developmental age) ?Does the patient have difficulty walking or climbing stairs?: No ?Weakness of Legs: None ?Weakness of Arms/Hands: None ? ?Permission Sought/Granted ?  ?  ?   ?   ?   ?   ? ?Emotional Assessment ?  ?  ?  ?  ?  ?  ? ?Admission diagnosis:  Multiple rib fractures [S22.49XA] ?Closed fracture of multiple ribs, unspecified laterality, initial  encounter [S22.49XA] ?Pneumothorax, unspecified type [J93.9] ?Patient Active Problem List  ? Diagnosis Date Noted  ? Multiple rib fractures 11/15/2021  ? Pneumothorax 11/15/2021  ? Chronic atrial fibrillation with RVR ([place]) 11/05/2021  ? Depression with anxiety 11/05/2021  ? Chronic diastolic CHF (congestive heart failure) ([place]) 11/05/2021  ? Chronic kidney disease, stage 3a ([place]) 11/05/2021  ? Boil of lower extremity-left ankle 11/05/2021  ? Alcohol use 11/05/2021  ? Hypomagnesemia 11/05/2021  ? Ankle wound, left, subsequent encounter 08/19/2021  ? Hyperparathyroidism ([place]) 02/17/2021  ? Slow heart beat 09/22/2020  ? CHF (congestive heart failure) ([place]) 09/19/2020  ? Arrhythmia 09/19/2020  ? VT (ventricular tachycardia) ([place]) 09/19/2020  ? Ventricular tachycardia ([place]) 09/19/2020  ? Acute CHF (congestive heart failure) ([place]) 09/18/2020  ? Troponin level elevated 09/18/2020  ? Itching 09/05/2020  ? Coagulation defect ([place]) 08/12/2020  ? 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  ? Chronic neck pain 04/15/2020  ? Chronic kidney disease, stage 3, mod decreased GFR (HCC) 04/05/2020  ? DOE (dyspnea on exertion) 02/09/2020  ? Anxiety 01/23/2020  ? Gout 05/18/2019  ? Nodule of finger, bilateral 03/22/2019  ? Clavi 03/09/2019  ? Chronic bilateral low back pain without  sciatica 12/31/2017  ? Atrial fibrillation ([place]) 09/10/2017  ? Depression, major, single episode, mild ([place]) 09/10/2017  ? Chronic venous insufficiency 09/09/2017  ? Lymphedema 09/09/2017  ? Dry eyes 07/06/2017  ? Pain and swelling of left ankle 03/29/2017  ? Fall 05/01/2016  ? Leg weakness, bilateral 12/30/2015  ? Anemia of chronic disease 05/19/2013  ? Psoriatic arthritis ([place]) 02/03/2013  ? Psoriasis   ? Osteoarthritis, multiple sites   ? History of breast cancer 10/28/2011  ? GERD (gastroesophageal reflux disease)   ? HTN (hypertension)   ? Osteopenia   ? Carotid stenosis 05/27/2011  ? Occlusion and stenosis of carotid artery without  mention of cerebral infarction 05/27/2011  ? ?PCP:  [name], MD ?Pharmacy:   ?[id number] [id number]?Phone: [phone]: [phone]? ? ? ? ?Social Determinants of Health (SDOH) Interventions ?  ? ?Readmission Risk Interventions ?   ? View : No data to display.  ?  ?  ?  ? ? ? ?

## 2021-11-17 NOTE — Progress Notes (Signed)
?Progress Note ? ? ?Patient: [name] GYK:[id number] DOB: [birth year] DOA: 11/15/2021     2 ? ?DOS: the patient was seen and examined on 11/17/2021 ?  ?Brief hospital course: ?This 81 years old female with PMH significant for chronic diastolic CHF, history of A-fib on anticoagulation therapy, history of breast cancer, hypertension, GERD presented in the ED for the evaluation of left-sided chest pain.  Patient describes the chest pain is severe 8/10 on pain intensity and aggravated with deep inspiration.  Patient also reported having associated muscle spasms. Patient reported 2 days ago she fell, it sounds like a mechanical fall as she leaned over to pick up her phone and fell on the left side,  landed on couch. She has been taking Tylenol without improvement.  Patient denies any dizziness, palpitations, diaphoresis.  Patient was recently hospitalized for A-fib with RVR and was discharged on Eliquis.  Work-up in the ED reveals multiple rib fractures and pneumothorax requiring chest tube insertion.  General surgery is following.  Recommended to continue chest tube to suction. ? ?Assessment and Plan: ?* Multiple rib fractures ?Patient presented with left-sided chest wall pain,  status post mechanical fall 2 days ago at home.Marland Kitchen ?Imaging shows acute fractures of the posterolateral left fifth through seventh ribs. Pneumothorax. ?Adequate pain control, muscle relaxants. ?Continuous incentive spirometry, fall precautions. ? ? ?Pneumothorax ?Patient presented for the evaluation of left lateral chest wall pain and imaging shows small left pleural effusion with possible pneumothorax. ?IR consulted, patient underwent pigtail insertion.  Tolerated well. ?Chest x-ray shows well-positioned left leg tail thoracostomy tube with resolution of previously demonstrated pneumothorax. ?General surgery following recommended to continue chest tube to suction. ? ?Chronic kidney disease, stage 3a ([place]) ?Renal functions at baseline. ?Avoid  nephrotoxic medications ? ?Depression with anxiety ?Continue Remeron and lorazepam. ? ?Atrial fibrillation ([place]) ?Heart rate well controlled. ?Continue amiodarone. ?Hold Eliquis given pigtail tube thoracostomy. ? ? ?HTN (hypertension) ?BP remains stable. Continue amlodipine ? ? ?Subjective: Patient was seen and examined at bedside.  Overnight events noted. ?Patient reports feeling better, still has chest pain, able to breathe better. ?She underwent pigtail thoracostomy tube insertion yesterday tolerated well. ?Chest x-ray shows resolution of pneumothorax. ? ?Physical Exam: ?Vitals:  ? 11/16/21 2351 11/17/21 0503 11/17/21 0746 11/17/21 1124  ?BP: 125/71 120/74 (!) 156/67 (!) 144/61  ?Pulse: 66 70 70 66  ?Resp: '14 18 17 17  '$ ?Temp: 98.3 ?F (36.8 ?C) 98.3 ?F (36.8 ?C) 98.9 ?F (37.2 ?C) 97.9 ?F (36.6 ?C)  ?TempSrc:      ?SpO2: 95% 95% 94% 99%  ?Weight:      ?Height:      ? ?General exam: Appears comfortable, deconditioned, not in any distress. ?Respiratory system: CTA bilaterally, no wheezing, no crackles, respiratory effort normal. ?Chest wall: Pigtail catheter noted in the left chest with good seal. ?Cardiovascular system: S1-S2 heard, Irregular rhythm, no murmur. ?Gastrointestinal system: Abdomen is soft, non tender, non distended, BS+ ?Central nervous system: Alert, oriented x3, no focal neurological deficits. ?Extremities: No edema, no cyanosis, no clubbing. ?Psychiatry: Mood, insight, judgment appropriate ? ?Data Reviewed: ?I have Reviewed nursing notes, Vitals, and Lab results since pt's last encounter. Pertinent lab results CBC, BMP ?I have ordered test including CBC, BMP ?I have independently visualized and interpreted imaging chest x-ray which showed shows resolution of pneumothorax proper placement of pigtail catheter.Marland Kitchen ?I have reviewed the last note from surgery,  ?I have discussed pt's care plan and test results with patient.  ? ?Family Communication: Daughter at  bedside ? ?Disposition: ?Status is:  Inpatient ?Remains inpatient appropriate because: Admitted for left-sided chest wall pain found to have multiple rib fractures and left pneumothorax , she underwent pigtail catheter placement. ? ? Planned Discharge Destination: Home ? ? ? ? ?Time spent: 35 minutes ? ?Author: ?Shawna Clamp, MD ?11/17/2021 11:40 AM ? ?For on call review [email or web address].  ?

## 2021-11-18 ENCOUNTER — Inpatient Hospital Stay: Payer: Medicare Other

## 2021-11-18 DIAGNOSIS — S2242XA Multiple fractures of ribs, left side, initial encounter for closed fracture: Secondary | ICD-10-CM | POA: Diagnosis not present

## 2021-11-18 DIAGNOSIS — S270XXA Traumatic pneumothorax, initial encounter: Secondary | ICD-10-CM | POA: Diagnosis not present

## 2021-11-18 DIAGNOSIS — S2242XK Multiple fractures of ribs, left side, subsequent encounter for fracture with nonunion: Secondary | ICD-10-CM | POA: Diagnosis not present

## 2021-11-18 NOTE — Progress Notes (Signed)
Mobility Specialist - Progress Note ? ? ? 11/18/21 1500  ?Mobility  ?Activity Stood at bedside;Dangled on edge of bed;Ambulated with assistance in hallway  ?Level of Assistance Standby assist, set-up cues, supervision of patient - no hands on  ?Assistive Device Front wheel walker  ?Distance Ambulated (ft) 80 ft  ?Activity Response Tolerated well  ?$Mobility charge 1 Mobility  ? ? ?Pt supine upon arrival using 2L. Pt completes bed mobility ModI + extra time with HOB elevated and voices mild pain. Completes STS ModI and ambulates with SBA with O2 between 90-92% -- voiced leg weakness and requests to return. Pt returns to bed with MinA and is left in bed with alarm set and needs in reach. ? ?Merrily Brittle ?Mobility Specialist ?11/18/21, 3:44 PM ? ? ? ? ?

## 2021-11-18 NOTE — Progress Notes (Signed)
?Progress Note ? ? ?Patient: [name] KDT:[id number] DOB: 13-Aug-1940 DOA: 11/15/2021     3 ? ?DOS: the patient was seen and examined on 11/18/2021 ?  ?Brief hospital course: ?This 81 years old female with PMH significant for chronic diastolic CHF, history of A-fib on anticoagulation therapy, history of breast cancer, hypertension, GERD presented in the ED for the evaluation of left-sided chest pain.  Patient describes the chest pain is severe 8/10 on pain intensity and aggravated with deep inspiration.  Patient also reported having associated muscle spasms. Patient reported 2 days ago she fell, it sounds like a mechanical fall as she leaned over to pick up her phone and fell on the left side,  landed on couch. She has been taking Tylenol without improvement.  Patient denies any dizziness, palpitations, diaphoresis.  Patient was recently hospitalized for A-fib with RVR and was discharged on Eliquis.  Work-up in the ED reveals multiple rib fractures and pneumothorax requiring chest tube insertion.  General surgery is following.  Recommended to continue chest tube to suction. ? ?Assessment and Plan: ?* Multiple rib fractures ?Patient presented with left-sided chest wall pain,  status post mechanical fall 2 days ago at home.Marland Kitchen ?Imaging shows acute fractures of the posterolateral left fifth through seventh ribs. Pneumothorax. ?Adequate pain control, muscle relaxants. ?Continuous incentive spirometry, fall precautions. ? ? ?Pneumothorax ?Patient presented for the evaluation of left lateral chest wall pain and imaging showed small left pleural effusion with possible pneumothorax. ?IR consulted, patient underwent pigtail insertion.  Tolerated well. ?Chest x-ray shows well-positioned left leg tail thoracostomy tube with resolution of previously demonstrated pneumothorax. ?General surgery following recommended to continue chest tube to suction. ?Repeat chest x-ray showed persistent tiny left apical pneumothorax. ?Plan  for repeat chest x-ray in a.m. if pneumothorax improved, chest tube can be removed. ? ?Chronic kidney disease, stage 3a ([place]) ?Renal functions at baseline. ?Avoid nephrotoxic medications ? ?Depression with anxiety ?Continue Remeron and lorazepam. ? ?Atrial fibrillation ([place]) ?Heart rate well controlled. ?Continue amiodarone. ?Hold Eliquis given pigtail tube thoracostomy. ? ? ?HTN (hypertension) ?BP remains stable. Continue amlodipine ? ? ?Subjective: Patient was seen and examined at bedside.  Overnight events noted. ?Patient reports feeling better.  Still has some chest soreness, able to breathe better. ?Chest x-ray shows persistent tiny pneumothorax. ? ?Physical Exam: ?Vitals:  ? 11/17/21 2100 11/18/21 0015 11/18/21 0435 11/18/21 9983  ?BP: (!) 150/82 (!) 142/68 (!) 143/78 (!) 104/53  ?Pulse: 73 67 71 67  ?Resp: '20 18 18 15  '$ ?Temp: (!) 97.3 ?F (36.3 ?C)  98.3 ?F (36.8 ?C)   ?TempSrc: Oral     ?SpO2: 96% 96% 96% 95%  ?Weight:      ?Height:      ? ?General exam: Appears comfortable, deconditioned, not in any distress. ?Respiratory system: CTA bilaterally, no wheezing, no crackles, normal respiratory effort. ?Chest wall: Pigtail catheter noted in the left chest with good seal. ?Cardiovascular system: S1-S2 heard, Irregular rhythm, no murmur. ?Gastrointestinal system: Abdomen is soft, non tender, non distended, BS+ ?Central nervous system: Alert, oriented x3, no focal neurological deficits. ?Extremities: No edema, no cyanosis, no clubbing. ?Psychiatry: Mood, insight, judgment appropriate ? ?Data Reviewed: ?I have Reviewed nursing notes, Vitals, and Lab results since pt's last encounter. Pertinent lab results CBC, BMP ?I have ordered test including CBC, BMP ?I have independently visualized and interpreted imaging chest x-ray which showed tiny pneumothorax. ?I have reviewed the last note from general surgery,  ?I have discussed pt's care plan and test results  with patient.  ? ?Family Communication: Daughter at  bedside ? ?Disposition: ?Status is: Inpatient ?Remains inpatient appropriate because: Admitted for left-sided chest wall pain found to have multiple rib fractures and left pneumothorax , she underwent pigtail catheter placement.  Management as per general surgery. ? ? Planned Discharge Destination: Home ? ? ? ? ?Time spent: 35 minutes ? ?Author: ?Shawna Clamp, MD ?11/18/2021 10:40 AM ? ?For on call review [email or web address].  ?

## 2021-11-18 NOTE — Progress Notes (Signed)
[facility] ?SURGICAL PROGRESS NOTE (cpt [phone]) ? ?Hospital Day(s): 3. ? ?Interval History: Patient seen and examined, no acute events or new complaints overnight. Patient reports she is feeling good. Left sided chest discomfort secondary chest tube. Breathing feels baseline per her report. No fever, chills. Chest tube with 600 ccs out yesterday morning; however, I suspect this has been documented as output since placement. She has minimal since last marking on her pleurovac. CXR this morning with very tiny apical pneumothorax. She is without air leak.  ? ?Review of Systems:  ?Constitutional: denies fever, chills  ?HEENT: denies cough or congestion  ?Respiratory: denies any shortness of breath  ?Cardiovascular: + chest pain (chest tube); denied palpitations  ?Genitourinary: denies burning with urination or urinary frequency ?Musculoskeletal: denies pain, decreased motor or sensation ? ?Vital signs in last 24 hours: [min-max] current  ?Temp:  [97.3 ?F (36.3 ?C)-98.9 ?F (37.2 ?C)] 98.3 ?F (36.8 ?C) (05/02 0435) ?Pulse Rate:  [60-73] 71 (05/02 0435) ?Resp:  [17-20] 18 (05/02 0435) ?BP: (104-156)/(57-82) 143/78 (05/02 0435) ?SpO2:  [94 %-99 %] 96 % (05/02 0435)     Height: '5\' 10"'$  (177.8 cm) Weight: 53.5 kg BMI (Calculated): 16.93  ? ?Intake/Output last 2 shifts:  ?05/01 0701 - 05/02 0700 ?In: 120 [P.O.:120] ?Out: 750 [Urine:150; Chest Tube:600]  ? ?Physical Exam:  ?Constitutional: alert, cooperative and no distress  ?HENT: normocephalic without obvious abnormality  ?Eyes: PERRL, EOM's grossly intact and symmetric  ?Respiratory: breathing non-labored at rest; on [place]; breath sounds equal throughout; question of slight decrease at left apex ?Cardiovascular: regular rate and sinus rhythm  ?Chest: Chest tube to left lateral chest wall; site CDI; output serous. No appreciable air leak but patient with very weak cough.  ? ? ?Labs:  ? ?  Latest Ref Rng & Units 11/17/2021  ?  4:01 AM 11/16/2021  ?  4:43 AM 11/15/2021   ?  9:05 AM  ?CBC  ?WBC 4.0 - 10.5 K/uL 7.2   6.5   8.3    ?Hemoglobin 12.0 - 15.0 g/dL 10.6   10.7   11.8    ?Hematocrit 36.0 - 46.0 % 31.5   33.0   36.1    ?Platelets 150 - 400 K/uL 251   246   271    ? ? ?  Latest Ref Rng & Units 11/17/2021  ?  4:01 AM 11/16/2021  ?  4:43 AM 11/15/2021  ?  9:05 AM  ?CMP  ?Glucose 70 - 99 mg/dL 100   84   118    ?BUN 8 - 23 mg/dL '22   16   22    '$ ?Creatinine 0.44 - 1.00 mg/dL 0.94   0.85   1.02    ?Sodium 135 - 145 mmol/L 137   138   137    ?Potassium 3.5 - 5.1 mmol/L 3.9   4.0   3.7    ?Chloride 98 - 111 mmol/L 103   103   98    ?CO2 22 - 32 mmol/L '29   27   29    '$ ?Calcium 8.9 - 10.3 mg/dL 8.7   8.8   9.4    ? ? ? ?Imaging studies:  ? ?CXR (11/18/2021) personally reviewed with very small apical PTX, chest tube in stable position, and radiologist report reviewed:  ?IMPRESSION: ?Persistent tiny left apical pneumothorax. Left chest tube remains in ?place. ? ? ?Assessment/Plan: (ICD-10's: J93.9) ?81 y.o. female with improving/resolving traumatic left pneumothorax and rib fractures ? ? - Chest  tube placed to water seal today. If she develops worsening chest pain/SOB may need to return to -20 cm suction ? - Plan for CXR in the AM. If PTX remains improved/resolved, we can removed chest tube tomorrow morning.  ? - Pulmonary toilet; IS ?- Pain control prn  ? - Mobilization encouraged as feasible ?- Further management per primary service; we will follow   ? ?All of the above findings and recommendations were discussed with the patient, and the medical team, and all of patient's questions were answered to her expressed satisfaction. ? ?-- ?Edison Simon, PA-C ?[facility] ?11/18/2021, 7:32 AM ?M-F: 7am - 4pm ? ?

## 2021-11-19 ENCOUNTER — Inpatient Hospital Stay: Payer: Medicare Other

## 2021-11-19 DIAGNOSIS — J939 Pneumothorax, unspecified: Secondary | ICD-10-CM | POA: Diagnosis not present

## 2021-11-19 DIAGNOSIS — I10 Essential (primary) hypertension: Secondary | ICD-10-CM

## 2021-11-19 DIAGNOSIS — I480 Paroxysmal atrial fibrillation: Secondary | ICD-10-CM | POA: Diagnosis not present

## 2021-11-19 DIAGNOSIS — N1831 Chronic kidney disease, stage 3a: Secondary | ICD-10-CM

## 2021-11-19 DIAGNOSIS — F418 Other specified anxiety disorders: Secondary | ICD-10-CM | POA: Diagnosis not present

## 2021-11-19 DIAGNOSIS — S270XXA Traumatic pneumothorax, initial encounter: Secondary | ICD-10-CM | POA: Diagnosis not present

## 2021-11-19 DIAGNOSIS — S2242XA Multiple fractures of ribs, left side, initial encounter for closed fracture: Secondary | ICD-10-CM | POA: Diagnosis not present

## 2021-11-19 LAB — BASIC METABOLIC PANEL
Anion gap: 6 (ref 5–15)
BUN: 25 mg/dL — ABNORMAL HIGH (ref 8–23)
CO2: 27 mmol/L (ref 22–32)
Calcium: 8.6 mg/dL — ABNORMAL LOW (ref 8.9–10.3)
Chloride: 104 mmol/L (ref 98–111)
Creatinine, Ser: 0.97 mg/dL (ref 0.44–1.00)
GFR, Estimated: 58 mL/min — ABNORMAL LOW (ref >60)
Glucose, Bld: 102 mg/dL — ABNORMAL HIGH (ref 70–99)
Potassium: 4.1 mmol/L (ref 3.5–5.1)
Sodium: 137 mmol/L (ref 135–145)

## 2021-11-19 LAB — MAGNESIUM: Magnesium: 1.6 mg/dL — ABNORMAL LOW (ref 1.7–2.4)

## 2021-11-19 LAB — CBC
HCT: 31.7 % — ABNORMAL LOW (ref 36.0–46.0)
Hemoglobin: 10.5 g/dL — ABNORMAL LOW (ref 12.0–15.0)
MCH: 34.5 pg — ABNORMAL HIGH (ref 26.0–34.0)
MCHC: 33.1 g/dL (ref 30.0–36.0)
MCV: 104.3 fL — ABNORMAL HIGH (ref 80.0–100.0)
Platelets: 282 10*3/uL (ref 150–400)
RBC: 3.04 MIL/uL — ABNORMAL LOW (ref 3.87–5.11)
RDW: 14 % (ref 11.5–15.5)
WBC: 6.2 10*3/uL (ref 4.0–10.5)
nRBC: 0 % (ref 0.0–0.2)

## 2021-11-19 LAB — PHOSPHORUS: Phosphorus: 3.4 mg/dL (ref 2.5–4.6)

## 2021-11-19 MED ORDER — APIXABAN 2.5 MG PO TABS
2.5000 mg | ORAL_TABLET | Freq: Two times a day (BID) | ORAL | Status: DC
  Administered 2021-11-19 – 2021-11-20 (×2): 2.5 mg via ORAL
  Filled 2021-11-19 (×2): qty 1

## 2021-11-19 MED ORDER — MAGNESIUM SULFATE 4 GM/100ML IV SOLN
4.0000 g | Freq: Once | INTRAVENOUS | Status: AC
  Administered 2021-11-19: 4 g via INTRAVENOUS
  Filled 2021-11-19: qty 100

## 2021-11-19 NOTE — Discharge Instructions (Signed)
In addition to included general instructions, ? ?Wound care: Leave dressing in place x48 hours. This is water proof, okay to shower. No baths.  ? ?Call office [phone] / [phone]) at any time if any questions, worsening pain, fevers/chills, bleeding, drainage from incision site, or other concerns. We will see you in follow up in about 2 weeks. I have ordered a CXR to be completed prior to your visit  ? ?

## 2021-11-19 NOTE — Progress Notes (Signed)
?PROGRESS NOTE ? ?[name] JAS:[id number] DOB: 02-02-1941 DOA: 11/15/2021 ?PCP: [name], MD ? ?Brief History   ? ? This 81 years old female with PMH significant for chronic diastolic CHF, history of A-fib on anticoagulation therapy, history of breast cancer, hypertension, GERD presented in the ED for the evaluation of left-sided chest pain.  Patient describes the chest pain is severe 8/10 on pain intensity and aggravated with deep inspiration.  Patient also reported having associated muscle spasms. Patient reported 2 days ago she fell, it sounds like a mechanical fall as she leaned over to pick up her phone and fell on the left side,  landed on couch. She has been taking Tylenol without improvement.  Patient denies any dizziness, palpitations, diaphoresis.  Patient was recently hospitalized for A-fib with RVR and was discharged on Eliquis.  Work-up in the ED reveals multiple rib fractures and pneumothorax requiring chest tube insertion.  General surgery is following.  Recommended to continue chest tube to suction. ? ?The chest tube was removed at bedside by general surgery today.  ? ?Consultants  ?General surgery ? ?Procedures  ?CT placement and removal ? ?Antibiotics  ? ?Anti-infectives (From admission, onward)  ? ? Start     Dose/Rate Route Frequency Ordered Stop  ? 11/15/21 1245  doxycycline (VIBRA-TABS) tablet 100 mg  Status:  Discontinued       ? 100 mg Oral 2 times daily 11/15/21 1230 11/15/21 1627  ? ?  ? ?Subjective  ?The patient is resting comfortably. No new complaints.  ? ?Objective  ? ?Vitals:  ?Vitals:  ? 11/19/21 0833 11/19/21 1127  ?BP: 123/62 116/62  ?Pulse: 62 68  ?Resp: (!) 21 (!) 21  ?Temp: 99.3 ?F (37.4 ?C) 99.2 ?F (37.3 ?C)  ?SpO2: 95% 90%  ? ? ?Exam: ? ?Constitutional:  ?The patient is awake, alert, and oriented x 3. No acute distress. ?Respiratory:  ?No increased work of breathing. ?No wheezes, rales, or rhonchi ?No tactile fremitus ?Cardiovascular:  ?Regular rate and  rhythm ?No murmurs, ectopy, or gallups. ?No lateral PMI. No thrills. ?Abdomen:  ?Abdomen is soft, non-tender, non-distended ?No hernias, masses, or organomegaly ?Normoactive bowel sounds.  ?Musculoskeletal:  ?No cyanosis, clubbing, or edema ?Skin:  ?No rashes, lesions, ulcers ?palpation of skin: no induration or nodules ?Neurologic:  ?CN 2-12 intact ?Sensation all 4 extremities intact ?Psychiatric:  ?Mental status ?Mood, affect appropriate ?Orientation to person, place, time  ?judgment and insight appear intact ? ?I have personally reviewed the following:  ? ?Today's Data  ?Vitals ? ?Lab Data  ?CBC, BMP ? ?Micro Data  ?Pleural fluid culture: no growth ? ?Imaging  ?CT chest ?CT head ?CXR ? ?Cardiology Data  ?EKG ? ?Scheduled Meds: ? allopurinol  150 mg Oral Daily  ? amiodarone  200 mg Oral Daily  ? amLODipine  10 mg Oral Daily  ? apixaban  2.5 mg Oral BID  ? cholecalciferol  2,000 Units Oral Daily  ? folic acid  1 mg Oral Daily  ? furosemide  20 mg Oral QODAY  ? mirtazapine  15 mg Oral QHS  ? multivitamin-lutein  1 capsule Oral BID  ? sodium chloride flush  3 mL Intravenous Q12H  ? thiamine  100 mg Oral Daily  ? Or  ? thiamine  100 mg Intravenous Daily  ? ?Continuous Infusions: ? sodium chloride    ? methocarbamol (ROBAXIN) IV    ? ? ?Principal Problem: ?  Multiple rib fractures ?Active Problems: ?  Pneumothorax ?  HTN (hypertension) ?  Atrial fibrillation ([place]) ?  Depression with anxiety ?  Chronic kidney disease, stage 3a ([place]) ? ? LOS: 4 days  ? ?A & P  ?* Multiple rib fractures ?Patient presented with left-sided chest wall pain,  status post mechanical fall 2 days ago at home.Marland Kitchen ?Imaging shows acute fractures of the posterolateral left fifth through seventh ribs. Pneumothorax. ?Adequate pain control, muscle relaxants. ?Continuous incentive spirometry, fall precautions. ?  ?  ?Pneumothorax ?Patient presented for the evaluation of left lateral chest wall pain and imaging showed small left pleural effusion with  possible pneumothorax. ?IR consulted, patient underwent pigtail insertion.  Tolerated well. ?Chest x-ray shows well-positioned left leg tail thoracostomy tube with resolution of previously demonstrated pneumothorax. ?General surgery following recommended to continue chest tube to suction. ?Repeat chest x-ray showed persistent tiny left apical pneumothorax. ?Plan for repeat chest x-ray in a.m. if pneumothorax improved, chest tube can be removed. ?  ?Chronic kidney disease, stage 3a ([place]) ?Renal functions at baseline. ?Avoid nephrotoxic medications ?  ?Depression with anxiety ?Continue Remeron and lorazepam. ?  ?Atrial fibrillation ([place]) ?Heart rate well controlled. ?Continue amiodarone. ?Hold Eliquis given pigtail tube thoracostomy. ?  ?HTN (hypertension) ?BP remains stable. Continue amlodipine ?  ? ?I have seen and examined this patient myself. I have spent 34 minutes in her evaluation and care. ? ?DVT prophylaxis: Eliquis ?Code Status: Full Code ?Family Communication: None available ?Disposition Plan: Home  ? ? ?Lenzy Kerschner, DO ?[facility] ?Direct contact: see [email or web address]  ?7PM-7AM contact night coverage as above ?11/19/2021, 5:11 PM  LOS: 4 days  ? ? ? ? ?

## 2021-11-19 NOTE — Plan of Care (Signed)

## 2021-11-19 NOTE — Progress Notes (Signed)
[facility] ?SURGICAL PROGRESS NOTE (cpt [phone]) ? ?Hospital Day(s): 4. ? ?Interval History: Patient seen and examined, no acute events or new complaints overnight. Patient reports she is doing well. Unchanged lateral chest soreness secondary to rib fractures and CT. No fever, chills. Chest tube with 60 ccs out yesterday; serous. CXR this morning with improved very tiny apical pneumothorax; continues to remain without air leak.  ? ?Review of Systems:  ?Constitutional: denies fever, chills  ?HEENT: denies cough or congestion  ?Respiratory: denies any shortness of breath  ?Cardiovascular: + chest pain (chest tube); denied palpitations  ?Genitourinary: denies burning with urination or urinary frequency ?Musculoskeletal: denies pain, decreased motor or sensation ? ?Vital signs in last 24 hours: [min-max] current  ?Temp:  [97.5 ?F (36.4 ?C)-99.7 ?F (37.6 ?C)] 97.5 ?F (36.4 ?C) (05/03 0209) ?Pulse Rate:  [61-71] 69 (05/03 0209) ?Resp:  [15-19] 19 (05/03 0209) ?BP: (104-139)/(53-70) 129/67 (05/03 0209) ?SpO2:  [84 %-99 %] 94 % (05/03 0212)     Height: '5\' 10"'$  (177.8 cm) Weight: 53.5 kg BMI (Calculated): 16.93  ? ?Intake/Output last 2 shifts:  ?05/02 0701 - 05/03 0700 ?In: 240 [P.O.:240] ?Out: 510 [Urine:450; Chest Tube:60]  ? ?Physical Exam:  ?Constitutional: alert, cooperative and no distress  ?HENT: normocephalic without obvious abnormality  ?Eyes: PERRL, EOM's grossly intact and symmetric  ?Respiratory: breathing non-labored at rest; on [place]; breath sounds equal throughout; question of slight decrease at left apex ?Cardiovascular: regular rate and sinus rhythm  ?Chest: Chest tube to left lateral chest wall; site CDI; output serous. No appreciable air leak but patient with very weak cough.  ? ? ?Labs:  ? ?  Latest Ref Rng & Units 11/19/2021  ?  5:19 AM 11/17/2021  ?  4:01 AM 11/16/2021  ?  4:43 AM  ?CBC  ?WBC 4.0 - 10.5 K/uL 6.2   7.2   6.5    ?Hemoglobin 12.0 - 15.0 g/dL 10.5   10.6   10.7    ?Hematocrit 36.0 -  46.0 % 31.7   31.5   33.0    ?Platelets 150 - 400 K/uL 282   251   246    ? ? ?  Latest Ref Rng & Units 11/19/2021  ?  5:19 AM 11/17/2021  ?  4:01 AM 11/16/2021  ?  4:43 AM  ?CMP  ?Glucose 70 - 99 mg/dL 102   100   84    ?BUN 8 - 23 mg/dL '25   22   16    '$ ?Creatinine 0.44 - 1.00 mg/dL 0.97   0.94   0.85    ?Sodium 135 - 145 mmol/L 137   137   138    ?Potassium 3.5 - 5.1 mmol/L 4.1   3.9   4.0    ?Chloride 98 - 111 mmol/L 104   103   103    ?CO2 22 - 32 mmol/L '27   29   27    '$ ?Calcium 8.9 - 10.3 mg/dL 8.6   8.7   8.8    ? ? ? ?Imaging studies:  ? ?CXR (11/18/2021) personally reviewed with stable vs improved very small apical PTX, chest tube in stable position, and radiologist report reviewed:  ?IMPRESSION: ?1. Stable left-sided pigtail catheter with unchanged probable tiny ?residual left apical pneumothorax. ?2. Unchanged left basilar opacities and probable trace left pleural ?effusion. ? ? ?Assessment/Plan: (ICD-10's: J93.9) ?81 y.o. female with resolved traumatic left pneumothorax and rib fractures ? ? - Chest tube removed at bedside without  issues. Occlusive dressing placed; remain in placed x48 hours. Reviewed wound care instructions ? - Pulmonary toilet; IS ?- Pain control prn  ? - Mobilization encouraged as feasible ?- Further management per primary service ? ? - Discharge Planning; She is stable for DC from surgical perspective. I will see her in the office in ~2 weeks with CXR prior to appointment; updated orders/DC instructions.  ? ?All of the above findings and recommendations were discussed with the patient, and the medical team, and all of patient's questions were answered to her expressed satisfaction. ? ?-- ?Edison Simon, PA-C ?[facility] ?11/19/2021, 7:37 AM ?M-F: 7am - 4pm ? ?

## 2021-11-20 DIAGNOSIS — I480 Paroxysmal atrial fibrillation: Secondary | ICD-10-CM | POA: Diagnosis not present

## 2021-11-20 DIAGNOSIS — N1831 Chronic kidney disease, stage 3a: Secondary | ICD-10-CM | POA: Diagnosis not present

## 2021-11-20 DIAGNOSIS — F418 Other specified anxiety disorders: Secondary | ICD-10-CM | POA: Diagnosis not present

## 2021-11-20 DIAGNOSIS — S2242XK Multiple fractures of ribs, left side, subsequent encounter for fracture with nonunion: Secondary | ICD-10-CM | POA: Diagnosis not present

## 2021-11-20 LAB — BASIC METABOLIC PANEL
Anion gap: 6 (ref 5–15)
BUN: 25 mg/dL — ABNORMAL HIGH (ref 8–23)
CO2: 29 mmol/L (ref 22–32)
Calcium: 8.7 mg/dL — ABNORMAL LOW (ref 8.9–10.3)
Chloride: 101 mmol/L (ref 98–111)
Creatinine, Ser: 1.07 mg/dL — ABNORMAL HIGH (ref 0.44–1.00)
GFR, Estimated: 52 mL/min — ABNORMAL LOW (ref >60)
Glucose, Bld: 92 mg/dL (ref 70–99)
Potassium: 3.8 mmol/L (ref 3.5–5.1)
Sodium: 136 mmol/L (ref 135–145)

## 2021-11-20 LAB — CBC WITH DIFFERENTIAL/PLATELET
Abs Immature Granulocytes: 0.02 10*3/uL (ref 0.00–0.07)
Basophils Absolute: 0.1 10*3/uL (ref 0.0–0.1)
Basophils Relative: 1 %
Eosinophils Absolute: 0.3 10*3/uL (ref 0.0–0.5)
Eosinophils Relative: 4 %
HCT: 30.8 % — ABNORMAL LOW (ref 36.0–46.0)
Hemoglobin: 10 g/dL — ABNORMAL LOW (ref 12.0–15.0)
Immature Granulocytes: 0 %
Lymphocytes Relative: 20 %
Lymphs Abs: 1.4 10*3/uL (ref 0.7–4.0)
MCH: 33.3 pg (ref 26.0–34.0)
MCHC: 32.5 g/dL (ref 30.0–36.0)
MCV: 102.7 fL — ABNORMAL HIGH (ref 80.0–100.0)
Monocytes Absolute: 1.3 10*3/uL — ABNORMAL HIGH (ref 0.1–1.0)
Monocytes Relative: 20 %
Neutro Abs: 3.7 10*3/uL (ref 1.7–7.7)
Neutrophils Relative %: 55 %
Platelets: 291 10*3/uL (ref 150–400)
RBC: 3 MIL/uL — ABNORMAL LOW (ref 3.87–5.11)
RDW: 13.8 % (ref 11.5–15.5)
WBC: 6.7 10*3/uL (ref 4.0–10.5)
nRBC: 0 % (ref 0.0–0.2)

## 2021-11-20 MED ORDER — THIAMINE HCL 100 MG PO TABS: 100.0000 mg | ORAL_TABLET | Freq: Every day | ORAL | 0 refills | Status: DC

## 2021-11-20 MED ORDER — BACITRACIN-NEOMYCIN-POLYMYXIN 400-5-5000 EX OINT
TOPICAL_OINTMENT | CUTANEOUS | 0 refills | Status: DC | PRN
Start: 2021-11-20 — End: 2021-12-11

## 2021-11-20 MED ORDER — FOLIC ACID 1 MG PO TABS: 1.0000 mg | ORAL_TABLET | Freq: Every day | ORAL | 0 refills | Status: DC

## 2021-11-20 MED ORDER — OXYCODONE HCL 5 MG PO TABS: 5.0000 mg | ORAL_TABLET | Freq: Four times a day (QID) | ORAL | 0 refills | Status: DC | PRN

## 2021-11-20 NOTE — Discharge Summary (Signed)
?Physician Discharge Summary ?  ?Patient: [name] MRN: [id number] DOB: 17-Jun-1941  ?Admit date:     11/15/2021  ?Discharge date: 11/20/21  ?Discharge Physician: Ercell Perlman  ? ?PCP: [name], MD  ? ?Recommendations at discharge:  ? ? Discharge to home. ?Wound care as per general surgery instructions. ?Follow up with PCP in 7-10 days. ?Follow up with general surgery in 2 weeks. ? ?Discharge Diagnoses: ?Principal Problem: ?  Multiple rib fractures ?Active Problems: ?  Pneumothorax ?  HTN (hypertension) ?  Atrial fibrillation ([place]) ?  Depression with anxiety ?  Chronic kidney disease, stage 3a ([place]) ? ?Resolved Problems: ?  * No resolved hospital problems. * ? ?Hospital Course: ?This 81 years old female with PMH significant for chronic diastolic CHF, history of A-fib on anticoagulation therapy, history of breast cancer, hypertension, GERD presented in the ED for the evaluation of left-sided chest pain.  Patient describes the chest pain is severe 8/10 on pain intensity and aggravated with deep inspiration.  Patient also reported having associated muscle spasms. Patient reported 2 days ago she fell, it sounds like a mechanical fall as she leaned over to pick up her phone and fell on the left side,  landed on couch. She has been taking Tylenol without improvement.  Patient denies any dizziness, palpitations, diaphoresis.  Patient was recently hospitalized for A-fib with RVR and was discharged on Eliquis.  Work-up in the ED reveals multiple rib fractures and pneumothorax requiring chest tube insertion.  General surgery is following.  Recommended to continue chest tube to suction. ? ?Assessment and Plan: ?* Multiple rib fractures ?Patient presented with left-sided chest wall pain,  status post mechanical fall 2 days ago at home.Marland Kitchen ?Imaging shows acute fractures of the posterolateral left fifth through seventh ribs. Pneumothorax. ?Adequate pain control, muscle relaxants. ?Continuous incentive spirometry,  fall precautions. ? ? ?Pneumothorax ?Patient presented for the evaluation of left lateral chest wall pain and imaging showed small left pleural effusion with possible pneumothorax. ?IR consulted, patient underwent pigtail insertion.  Tolerated well. ?Chest x-ray shows well-positioned left leg tail thoracostomy tube with resolution of previously demonstrated pneumothorax. ?General surgery following recommended to continue chest tube to suction. ?Repeat chest x-ray showed persistent tiny left apical pneumothorax. ?Plan for repeat chest x-ray in a.m. if pneumothorax improved, chest tube can be removed. ? ?Chronic kidney disease, stage 3a ([place]) ?Renal functions at baseline. ?Avoid nephrotoxic medications ? ?Depression with anxiety ?Continue Remeron and lorazepam. ? ?Atrial fibrillation ([place]) ?Heart rate well controlled. ?Continue amiodarone. ?Hold Eliquis given pigtail tube thoracostomy. ? ? ?HTN (hypertension) ?BP remains stable. Continue amlodipine ? ? ?Consultants: General surgery ?Procedures performed: Chest tube insertion and removal  ?Disposition: Home ?Diet recommendation:  ?Discharge Diet Orders (From admission, onward)  ? ?  Start     Ordered  ? 11/20/21 0000  Diet - low sodium heart healthy       ? 11/20/21 1250  ? ?  ?  ? ?  ? ?Cardiac diet ?DISCHARGE MEDICATION: ?Allergies as of 11/20/2021   ?No Known Allergies ?  ? ?  ?Medication List  ?  ? ?STOP taking these medications   ? ?doxycycline 100 MG tablet ?Commonly known as: VIBRA-TABS ?  ? ?  ? ?TAKE these medications   ? ?acetaminophen 325 MG tablet ?Commonly known as: TYLENOL ?Take 2 tablets (650 mg total) by mouth every 4 (four) hours as needed for headache or mild pain. ?  ?allopurinol 100 MG tablet ?Commonly known as: ZYLOPRIM ?TAKE  1.5 TABLETS ('150MG'$  TOTAL) BY MOUTH DAILY ?  ?amiodarone 200 MG tablet ?Commonly known as: PACERONE ?TAKE 1 TABLET BY MOUTH EVERY DAY ?  ?amLODipine 10 MG tablet ?Commonly known as: NORVASC ?Take 1 tablet (10 mg total) by mouth  daily. ?  ?apixaban 2.5 MG Tabs tablet ?Commonly known as: ELIQUIS ?Take 1 tablet (2.5 mg total) by mouth 2 (two) times daily. ?  ?cholecalciferol 25 MCG (1000 UNIT) tablet ?Commonly known as: VITAMIN D ?Take 2 tablets (2,000 Units total) by mouth daily. ?  ?folic acid 1 MG tablet ?Commonly known as: FOLVITE ?Take 1 tablet (1 mg total) by mouth daily. ?Start taking on: Nov 21, 2021 ?  ?furosemide 40 MG tablet ?Commonly known as: LASIX ?Take 0.5 tablet (20 mg) by mouth once daily as needed for swelling ?  ?LORazepam 0.5 MG tablet ?Commonly known as: ATIVAN ?TAKE 1 TABLET BY MOUTH TWICE A DAY AS NEEDED FOR ANXIETY ?  ?mirtazapine 15 MG tablet ?Commonly known as: REMERON ?TAKE 1 TABLET BY MOUTH EVERYDAY AT BEDTIME ?  ?multivitamin capsule ?Take 1 capsule by mouth daily. ?  ?neomycin-bacitracin-polymyxin ointment ?Commonly known as: NEOSPORIN ?Apply topically as needed for wound care. ?  ?oxyCODONE 5 MG immediate release tablet ?Commonly known as: Oxy IR/ROXICODONE ?Take 1 tablet (5 mg total) by mouth every 6 (six) hours as needed for severe pain or breakthrough pain. ?  ?PRESERVISION AREDS 2+MULTI VIT PO ?Take 1 capsule by mouth in the morning and at bedtime. ?  ?thiamine 100 MG tablet ?Take 1 tablet (100 mg total) by mouth daily. ?Start taking on: Nov 21, 2021 ?  ? ?  ? ?  ?  ? ? ?  ?Discharge Care Instructions  ?(From admission, onward)  ?  ? ? ?  ? ?  Start     Ordered  ? 11/20/21 0000  Discharge wound care:       ?Comments: Leave occlusive dressing in place for 48 hours then wound care as per instructions of general surgery.  ? 11/20/21 1250  ? ?  ?  ? ?  ? ? Follow-up Information   ? ? Tylene Fantasia, PA-C. Schedule an appointment as soon as possible for a visit in 2 week(s).   ?Specialty: Physician Assistant ?Why: hospital follow up; Left pneumothorax. Needs CXR prior to appointment ?Contact information: ?[address] ?[address] ?[place] [address] ?[phone] ? ? ?  ?  ? ? [name], MD. Schedule  an appointment as soon as possible for a visit in 1 week(s).   ?Specialty: Family Medicine ?Contact information: ?[address] ?[address] ?[place] [address] ?[phone] ? ? ?  ?  ? ?  ?  ? ?  ? ?Discharge Exam: ?Filed Weights  ? 11/15/21 0901  ?Weight: 53.5 kg  ? ?Exam: ? ?Constitutional:  ?The patient is awake, alert, and oriented x 3. No acute distress. ?Respiratory:  ?No increased work of breathing. ?No wheezes, rales, or rhonchi ?No tactile fremitus ?Cardiovascular:  ?Regular rate and rhythm ?No murmurs, ectopy, or gallups. ?No lateral PMI. No thrills. ?Abdomen:  ?Abdomen is soft, non-tender, non-distended ?No hernias, masses, or organomegaly ?Normoactive bowel sounds.  ?Musculoskeletal:  ?No cyanosis, clubbing, or edema ?Skin:  ?No rashes, lesions, ulcers ?palpation of skin: no induration or nodules ?Neurologic:  ?CN 2-12 intact ?Sensation all 4 extremities intact ?Psychiatric:  ?Mental status ?Mood, affect appropriate ?Orientation to person, place, time  ?judgment and insight appear intact ? ? ?Condition at discharge: fair ? ?The results of  significant diagnostics from this hospitalization (including imaging, microbiology, ancillary and laboratory) are listed below for reference.  ? ?Imaging Studies: ?DG Chest 2 View ? ?Result Date: 11/19/2021 ?CLINICAL DATA:  Follow-up left pneumothorax, chest tube EXAM: CHEST - 2 VIEW COMPARISON:  Chest radiograph 1 day prior FINDINGS: The left-sided pigtail catheter is stable in position. The cardiomediastinal silhouette is stable. Patchy opacities in the left base and probable trace left pleural effusion are not significantly changed. A trace residual left apical pneumothorax is again suspected, not significantly changed. Aeration of the right lung is clear, with no new or worsening focal airspace disease. There is no significant right effusion. There is no right pneumothorax Multiple left-sided rib fractures are again seen. IMPRESSION: 1. Stable left-sided pigtail  catheter with unchanged probable tiny residual left apical pneumothorax. 2. Unchanged left basilar opacities and probable trace left pleural effusion. Electronically Signed   By: Valetta Mole M.D.   On: 05

## 2021-11-20 NOTE — Care Management Important Message (Signed)
Important Message ? ?Patient Details  ?Name: [name] ?MRN: [id number] ?Date of Birth: [birth year] ? ? ?Medicare Important Message Given:  Yes ? ? ? ? ?[name] ?11/20/2021, 1:08 PM ?

## 2021-11-20 NOTE — TOC Transition Note (Signed)
Transition of Care (TOC) - CM/SW Discharge Note ? ? ?Patient Details  ?Name: SOPHEE MCKIMMY ?MRN: [id number] ?Date of Birth: [birth year] ? ?Transition of Care (TOC) CM/SW Contact:  ?Laurena Slimmer, RN ?Phone Number: ?11/20/2021, 2:14 PM ? ? ?Clinical Narrative:    ?Spoke with patient and daughter, Amy. Patient agreeable to  [facility] Patient son will transport her home. TOC signing off.  ? ? ?Final next level of care: [place] ?Barriers to Discharge: Barriers Resolved ? ? ?Patient Goals and CMS Choice ?  ?  ?  ? ?Discharge Placement ?  ?           ?  ?  ?  ?  ? ?Discharge Plan and Services ?  ?  ?Post Acute Care Choice: Home Health          ?  ?  ?  ?  ?  ?HH Arranged: RN ?[place] Agency: Microbiologist ([place]) ?Date HH Agency Contacted: 11/19/21 ?  ?Representative spoke with at [place]: Floydene Flock ? ?Social Determinants of Health (SDOH) Interventions ?  ? ? ?Readmission Risk Interventions ?   ? View : No data to display.  ?  ?  ?  ? ? ? ? ? ?

## 2021-11-21 ENCOUNTER — Telehealth: Payer: Self-pay | Admitting: Family Medicine

## 2021-11-21 LAB — AEROBIC/ANAEROBIC CULTURE W GRAM STAIN (SURGICAL/DEEP WOUND): Culture: NO GROWTH

## 2021-11-21 NOTE — Telephone Encounter (Signed)
Patient was release from the hospital yesterday. She is reviewing her medication list and doesn't know some of the medication should be taken or not. ?

## 2021-11-22 DIAGNOSIS — Z20822 Contact with and (suspected) exposure to covid-19: Secondary | ICD-10-CM | POA: Diagnosis not present

## 2021-11-23 NOTE — Telephone Encounter (Signed)
The patient is being seen for a hospital follow-up on 11/24/21. We will review her medications at that time.  ?

## 2021-11-24 ENCOUNTER — Other Ambulatory Visit: Payer: Self-pay

## 2021-11-24 ENCOUNTER — Ambulatory Visit (INDEPENDENT_AMBULATORY_CARE_PROVIDER_SITE_OTHER): Payer: Medicare Other | Admitting: Family Medicine

## 2021-11-24 ENCOUNTER — Encounter: Payer: Self-pay | Admitting: Family Medicine

## 2021-11-24 VITALS — BP 124/78 | HR 76 | Temp 97.8°F | Ht 67.0 in | Wt 119.8 lb

## 2021-11-24 DIAGNOSIS — S2242XK Multiple fractures of ribs, left side, subsequent encounter for fracture with nonunion: Secondary | ICD-10-CM

## 2021-11-24 DIAGNOSIS — D472 Monoclonal gammopathy: Secondary | ICD-10-CM

## 2021-11-24 DIAGNOSIS — S270XXA Traumatic pneumothorax, initial encounter: Secondary | ICD-10-CM | POA: Diagnosis not present

## 2021-11-24 MED ORDER — HYDROCODONE-ACETAMINOPHEN 5-325 MG PO TABS: 1.0000 | ORAL_TABLET | Freq: Three times a day (TID) | ORAL | 0 refills | Status: DC | PRN

## 2021-11-24 NOTE — Telephone Encounter (Signed)
Patient called to schedule f/u from the hospital when she was in there for afib, she states she had an appt on 05/01 with Dr. Caryl Comes but had to cancel because she went back to the hospital for a broken rib and collapsed lung. Patient is scheduled for 06/28 with Dr. Caryl Comes and added to the wait list. Patient was not happy that she had to wait this long to see Dr. Caryl Comes. I offered her an appt in [place] but she cannot travel there. Just FYI in case she needs to be worked in sooner.  ?

## 2021-11-24 NOTE — Assessment & Plan Note (Signed)
Discussed that it was recommended to do an immunofixation based on her most recent SPEP.  She noted she would like to defer this to a couple of weeks from now given how much she has been poked in parotid recently.  Order was placed and she will be scheduled for labs. ?

## 2021-11-24 NOTE — Patient Instructions (Signed)
Nice to see you. ?Please call [phone] to schedule your bone density scan. ?We will get lab work in a couple of weeks. ?Please do not take the Vicodin (pain medication) with your lorazepam or alcohol.  Do not take the lorazepam with alcohol either. ?

## 2021-11-24 NOTE — Progress Notes (Signed)
?[name], MD ?Phone: [phone] ? ?[name] is a 81 y.o. female who presents today for follow-up. ? ?Rib fracture/pneumothorax: Patient reports she had a fall several days prior to presenting to the emergency department.  She was making soup and went into the living room and noted that the phone was on the floor.  She bent over to pick up the phone and ended up falling into the sofa.  She noted it was a soft fall.  She had some discomfort afterwards that was not bad.  She thought she bruised her ribs.  1.5 days later she developed more significant pain and presented to the emergency department where she was found to have multiple left-sided rib fractures and a pneumothorax.  She required a chest tube.  This helped resolve the pneumothorax and was eventually removed.  She notes she still has some discomfort from the ribs though it is progressively getting better.  She notes some labored breathing when she exerts herself though notes its not bad.  She notes no cough, fevers, or congestion.  She sees general surgery next week.  She notes no other falls in the past 6 months.  She notes she cannot find her oxycodone.  She notes she wonders why she was started on thiamine and folic acid.  She does not plan to take any more of this.  She has 1-2 alcoholic beverages daily.  She notes at most she takes the lorazepam 2 times a week. ? ?Social History  ? ?Tobacco Use  ?Smoking Status Former  ? Packs/day: 1.00  ? Years: 40.00  ? Pack years: 40.00  ? Types: Cigarettes  ? Quit date: 07/21/1995  ? Years since quitting: 26.3  ?Smokeless Tobacco Never  ? ? ?Current Outpatient Medications on File Prior to Visit  ?Medication Sig Dispense Refill  ? acetaminophen (TYLENOL) 325 MG tablet Take 2 tablets (650 mg total) by mouth every 4 (four) hours as needed for headache or mild pain.    ? allopurinol (ZYLOPRIM) 100 MG tablet TAKE 1.5 TABLETS ('150MG'$  TOTAL) BY MOUTH DAILY 135 tablet 1  ? amiodarone (PACERONE) 200 MG  tablet TAKE 1 TABLET BY MOUTH EVERY DAY 90 tablet 2  ? amLODipine (NORVASC) 10 MG tablet Take 1 tablet (10 mg total) by mouth daily. 90 tablet 3  ? apixaban (ELIQUIS) 2.5 MG TABS tablet Take 1 tablet (2.5 mg total) by mouth 2 (two) times daily. 60 tablet 6  ? cholecalciferol (VITAMIN D) 25 MCG (1000 UNIT) tablet Take 2 tablets (2,000 Units total) by mouth daily.    ? folic acid (FOLVITE) 1 MG tablet Take 1 tablet (1 mg total) by mouth daily. 30 tablet 0  ? furosemide (LASIX) 40 MG tablet Take 0.5 tablet (20 mg) by mouth once daily as needed for swelling 45 tablet 2  ? LORazepam (ATIVAN) 0.5 MG tablet TAKE 1 TABLET BY MOUTH TWICE A DAY AS NEEDED FOR ANXIETY 60 tablet 0  ? mirtazapine (REMERON) 15 MG tablet TAKE 1 TABLET BY MOUTH EVERYDAY AT BEDTIME 90 tablet 3  ? Multiple Vitamin (MULTIVITAMIN) capsule Take 1 capsule by mouth daily.    ? Multiple Vitamins-Minerals (PRESERVISION AREDS 2+MULTI VIT PO) Take 1 capsule by mouth in the morning and at bedtime.    ? neomycin-bacitracin-polymyxin (NEOSPORIN) ointment Apply topically as needed for wound care. 15 g 0  ? thiamine 100 MG tablet Take 1 tablet (100 mg total) by mouth daily. 30 tablet 0  ? ?No current facility-administered medications on file prior  to visit.  ? ? ? ?ROS see history of present illness ? ?Objective ? ?Physical Exam ?Vitals:  ? 11/24/21 1116  ?BP: 124/78  ?Pulse: 76  ?Temp: 97.8 ?F (36.6 ?C)  ?SpO2: 99%  ? ? ?BP Readings from Last 3 Encounters:  ?11/24/21 124/78  ?11/20/21 105/66  ?11/06/21 130/83  ? ?Wt Readings from Last 3 Encounters:  ?11/24/21 119 lb 12.8 oz (54.3 kg)  ?11/15/21 118 lb (53.5 kg)  ?11/06/21 114 lb 3.2 oz (51.8 kg)  ? ? ?Physical Exam ?Constitutional:   ?   General: She is not in acute distress. ?   Appearance: She is not diaphoretic.  ?Cardiovascular:  ?   Rate and Rhythm: Normal rate and regular rhythm.  ?   Heart sounds: Normal heart sounds.  ?Pulmonary:  ?   Effort: Pulmonary effort is normal.  ?   Breath sounds: Normal breath  sounds.  ?Skin: ?   General: Skin is warm and dry.  ?   Comments: Fulton Mole, CMA served as chaperone, left chest tube wound appears to be well-healing with minimal scabbing and no surrounding signs of infection or tenderness  ?Neurological:  ?   Mental Status: She is alert.  ? ? ? ?Assessment/Plan: Please see individual problem list. ? ?Problem List Items Addressed This Visit   ? ? 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  Discussed that it was recommended to do an immunofixation based on her most recent SPEP.  She noted she would like to defer this to a couple of weeks from now given how much she has been poked in parotid recently.  Order was placed and she will be scheduled for labs. ? ?  ?  ? Relevant Orders  ? Immunofixation (IFE), Serum  ? Multiple rib fractures - Primary  ?  Progressively improving.  She does note some discomfort mostly at night when she lays down.  I will prescribe her a small amount of Vicodin to take for pain.  I advised that she should not take this with her lorazepam or with any alcohol intake given the risk of excessive sedation and respiratory depression.  Discussed that they likely placed her on the thiamine and folic acid given that she does have an alcoholic beverage daily.  Discussed the risk of deficiency in those vitamins though she defers taking those further.  She was counseled to not take her lorazepam anywhere near when she has an alcoholic beverage.  She does not use the lorazepam on a consistent basis.  I also discussed the need for getting a bone density scan.  This was ordered.  The patient will call to get this scheduled. ? ?  ?  ? Relevant Medications  ? HYDROcodone-acetaminophen (NORCO/VICODIN) 5-325 MG tablet  ? Other Relevant Orders  ? DG Bone Density  ? Pneumothorax  ?  The patient's lungs seem to be well aerated on exam.  She was counseled on seeking medical attention if she developed sudden chest pain, shortness of breath, fevers,  congestion, or cough.  She will see general surgery as planned. ? ?  ?  ? ? ? ? ?Return in about 2 weeks (around 12/08/2021) for labs, 3 months PCP. ? ?I have spent 31 minutes in the care of this patient regarding history taking, documentation, completion of exam, review of recent discharge summary, discussion of plan, placing orders. ? ? ?[name], MD ?[place] ? ?

## 2021-11-24 NOTE — Assessment & Plan Note (Signed)
The patient's lungs seem to be well aerated on exam.  She was counseled on seeking medical attention if she developed sudden chest pain, shortness of breath, fevers, congestion, or cough.  She will see general surgery as planned. ?

## 2021-11-24 NOTE — Assessment & Plan Note (Addendum)
Progressively improving.  She does note some discomfort mostly at night when she lays down.  I will prescribe her a small amount of Vicodin to take for pain.  I advised that she should not take this with her lorazepam or with any alcohol intake given the risk of excessive sedation and respiratory depression.  Discussed that they likely placed her on the thiamine and folic acid given that she does have an alcoholic beverage daily.  Discussed the risk of deficiency in those vitamins though she defers taking those further.  She was counseled to not take her lorazepam anywhere near when she has an alcoholic beverage.  She does not use the lorazepam on a consistent basis.  I also discussed the need for getting a bone density scan.  This was ordered.  The patient will call to get this scheduled. ?

## 2021-11-25 ENCOUNTER — Telehealth: Payer: Self-pay

## 2021-11-25 NOTE — Telephone Encounter (Signed)
Attempted to call the patient to offer a sooner work in appointment with Dr. Caryl Comes: ? ?Thursday 12/11/21 at 9:00 am - [place] ? ?No answer- I left a detailed message on the patient's identified voice mail of the appt date/ time as listed above. ?I asked that she call back to confirm if this will work for her.  ? ? ?

## 2021-11-25 NOTE — Telephone Encounter (Signed)
Patient called to say Dr. Caryl Bis gave her some pain medication yesterday and it does nothing for her.  Patient would like to know if she can double-up on the dose.  Please call. ?

## 2021-11-25 NOTE — Telephone Encounter (Signed)
I called and spoke with the pharmacist and the medication was picked up on yesterday.  [name],cma  ?

## 2021-11-25 NOTE — Telephone Encounter (Signed)
Can you contact the pharmacy and see if she picked the hydrocodone prescription up?  I do not see that it has been filled in the controlled substance database. ?

## 2021-11-25 NOTE — Telephone Encounter (Signed)
Secure chat message received from scheduling that the patient called back. ?Her follow up appointment with Dr. Caryl Comes has been rescheduled to 12/11/21 at 9:00 am in [place].  ? ?

## 2021-11-26 MED ORDER — OXYCODONE-ACETAMINOPHEN 5-325 MG PO TABS: 1.0000 | ORAL_TABLET | Freq: Three times a day (TID) | ORAL | 0 refills | Status: DC | PRN

## 2021-11-26 NOTE — Telephone Encounter (Signed)
Called and LVM informing the patient of the message and informing her to call back If she has any questions. Mariluz Crespo,cma  ?

## 2021-11-26 NOTE — Telephone Encounter (Signed)
Noted.  I sent Percocet in for her to take instead of the hydrocodone.  She should not take any more of the hydrocodone.  If the Percocet makes her excessively drowsy or causes any other issues she needs to let us know right away.  She should not mix the Percocet with her lorazepam or with any alcohol. ?

## 2021-11-29 ENCOUNTER — Other Ambulatory Visit: Payer: Self-pay | Admitting: Internal Medicine

## 2021-12-03 ENCOUNTER — Telehealth: Payer: Self-pay | Admitting: Family Medicine

## 2021-12-03 NOTE — Telephone Encounter (Signed)
There should be an immunofixation order in placed on 5/8 as a future order. Please let me know if you can not see it.  ?

## 2021-12-03 NOTE — Telephone Encounter (Signed)
Patient has a lab appt 5/25, there are no orders in. ?

## 2021-12-04 ENCOUNTER — Ambulatory Visit
Admission: RE | Admit: 2021-12-04 | Discharge: 2021-12-04 | Disposition: A | Discharge status: Home / Self Care | Payer: Medicare Other | Source: Ambulatory Visit | Attending: Physician Assistant | Admitting: Physician Assistant

## 2021-12-04 ENCOUNTER — Ambulatory Visit (INDEPENDENT_AMBULATORY_CARE_PROVIDER_SITE_OTHER): Payer: Medicare Other | Admitting: Physician Assistant

## 2021-12-04 ENCOUNTER — Ambulatory Visit
Admission: RE | Admit: 2021-12-04 | Discharge: 2021-12-04 | Disposition: A | Discharge status: Home / Self Care | Payer: Medicare Other | Attending: Physician Assistant | Admitting: Physician Assistant

## 2021-12-04 ENCOUNTER — Encounter: Payer: Self-pay | Admitting: Physician Assistant

## 2021-12-04 VITALS — BP 109/72 | HR 84 | Temp 97.5°F | Ht 67.0 in | Wt 118.2 lb

## 2021-12-04 DIAGNOSIS — S270XXD Traumatic pneumothorax, subsequent encounter: Secondary | ICD-10-CM

## 2021-12-04 DIAGNOSIS — S2242XD Multiple fractures of ribs, left side, subsequent encounter for fracture with routine healing: Secondary | ICD-10-CM

## 2021-12-04 DIAGNOSIS — J90 Pleural effusion, not elsewhere classified: Secondary | ICD-10-CM | POA: Diagnosis not present

## 2021-12-04 DIAGNOSIS — S2242XK Multiple fractures of ribs, left side, subsequent encounter for fracture with nonunion: Secondary | ICD-10-CM

## 2021-12-04 DIAGNOSIS — R079 Chest pain, unspecified: Secondary | ICD-10-CM | POA: Diagnosis not present

## 2021-12-04 DIAGNOSIS — S270XXA Traumatic pneumothorax, initial encounter: Secondary | ICD-10-CM

## 2021-12-04 DIAGNOSIS — J939 Pneumothorax, unspecified: Secondary | ICD-10-CM | POA: Diagnosis not present

## 2021-12-04 NOTE — Progress Notes (Signed)
[facility] SURGICAL CLINIC NOTE  12/04/2021  History of Present Illness: [name] is a 81 y.o. female known to our service following recent admission from 04/29 - 05/04 for fall and left pneumothorax and rib fractures. She had chest tube placed 04/29 with interventional radiology. She ultimately did well and chest tube was removed on 05/03. She was discharged home on 05/04. She presents today for follow up. She reports that this morning she was more short of breath than her baseline but this has improved throughout the day. She continues to have soreness on her left lateral chest wall but again this is improved. She otherwise denied any chest pain, fever, chills. Chest tube site is well healed. No other new complaints this afternoon.     Past Medical History: Past Medical History:  Diagnosis Date   (HFpEF) heart failure with preserved ejection fraction ([place])    a. 08/2017 Echo: EF 55-60%, no rwma, mild MR, mildly dil LA, nl RV fxn; b. 03/2020 Echo: EF 60-65%, no rwma, Gr2 DD. RVSP 44.37mHg. Mod dil LA. Mild MR; c. 09/2020 Echo: EF 50-55%, no rwma, mild LVH, mod red RV fxn, mildly dily RA.   Acute CHF (congestive heart failure) ([place] 09/18/2020   Alcohol abuse 04/05/2020   Breast cancer ([facility] 2001   left breast   Cancer ([facility] 2001   left breast ca   Carotid arterial disease ([facility]    a. 10/2004 s/p L CEA; b. 12/2015 Carotid U/S: RICA 1-39%; b. LICA patent CEA site.   Closed right hip fracture ([facility] 09/20/2019   GERD (gastroesophageal reflux disease)    Hyperlipidemia    Hypertension    Osteoarthritis, multiple sites    Osteopenia    Persistent atrial fibrillation ([facility]    a. Dx 08/2017; b. CHA2DS2VASc = 6-->Pradaxa; c. 09/2017 Successful DCCV (second shock - 200J); d. 10/2017 Recurrent Afib-->flecainide started 11/2017; e. 06/2020 s/p DCCV (200J x 1); f. 06/2020 Recurrent AF->Flec inc 75bid; g. 09/2020 WCT->flec d/c'd->amio started.   Personal history of radiation therapy 2001    left breast ca   Psoriasis    Wide-complex tachycardia    a. 09/2020 in setting of presumed Flecainide toxicity.  Flecainide d/c'd.     Past Surgical History: Past Surgical History:  Procedure Laterality Date   ABDOMINAL HYSTERECTOMY     ANKLE FRACTURE SURGERY  4/08   left---hardware still in place   BREAST BIOPSY Left 2001   breast ca   BREAST EXCISIONAL BIOPSY Left yrs ago   benign   BREAST LUMPECTOMY Left 2001   f/u radiation   CARDIOVERSION N/A 10/04/2017   Procedure: CARDIOVERSION;  Surgeon: [name] MD;  [facility];  Service: Cardiovascular;  Laterality: N/A;   CARDIOVERSION N/A 12/16/2017   Procedure: CARDIOVERSION;  Surgeon: [name] MD;  Location: [facility];  Service: Cardiovascular;  Laterality: N/A;   CARDIOVERSION N/A 06/24/2020   Procedure: CARDIOVERSION;  Surgeon: [name] MD;  Location: [facility];  Service: Cardiovascular;  Laterality: N/A;   CAROTID ENDARTERECTOMY Left 10/23/2004   FRACTURE SURGERY     OOPHORECTOMY     SHOULDER SURGERY  6/07   left   TONSILLECTOMY AND ADENOIDECTOMY     TOTAL HIP ARTHROPLASTY  2004   right    Home Medications: Prior to Admission medications   Medication Sig Start Date End Date Taking? Authorizing Provider  acetaminophen (TYLENOL) 325 MG tablet Take 2 tablets (650 mg total) by mouth every 4 (four) hours as needed  for headache or mild pain. 09/19/20   Swayze, Ava, DO  allopurinol (ZYLOPRIM) 100 MG tablet TAKE 1.5 TABLETS ('150MG'$  TOTAL) BY MOUTH DAILY 05/05/21   [name], MD  amiodarone (PACERONE) 200 MG tablet TAKE 1 TABLET BY MOUTH EVERY DAY 04/04/21   Kathyrn Drown D, NP  amLODipine (NORVASC) 10 MG tablet Take 1 tablet (10 mg total) by mouth daily. 08/01/21   [name], MD  apixaban (ELIQUIS) 2.5 MG TABS tablet Take 1 tablet (2.5 mg total) by mouth 2 (two) times daily. 09/09/21   [name], MD  cholecalciferol (VITAMIN D) 25 MCG (1000 UNIT) tablet Take 2 tablets (2,000 Units  total) by mouth daily. 09/20/20   Swayze, Ava, DO  folic acid (FOLVITE) 1 MG tablet Take 1 tablet (1 mg total) by mouth daily. 11/21/21   Swayze, Ava, DO  furosemide (LASIX) 40 MG tablet Take 0.5 tablet (20 mg) by mouth once daily as needed for swelling 08/14/21   [name], MD  LORazepam (ATIVAN) 0.5 MG tablet TAKE 1 TABLET BY MOUTH TWICE A DAY AS NEEDED FOR ANXIETY 09/22/21   Dutch Quint B, FNP  mirtazapine (REMERON) 15 MG tablet TAKE 1 TABLET BY MOUTH EVERYDAY AT BEDTIME 09/22/21   Dutch Quint B, FNP  Multiple Vitamin (MULTIVITAMIN) capsule Take 1 capsule by mouth daily.    [provider]  Multiple Vitamins-Minerals (PRESERVISION AREDS 2+MULTI VIT PO) Take 1 capsule by mouth in the morning and at bedtime.    [provider]  neomycin-bacitracin-polymyxin (NEOSPORIN) ointment Apply topically as needed for wound care. 11/20/21   Swayze, Ava, DO  oxyCODONE-acetaminophen (PERCOCET/ROXICET) 5-325 MG tablet Take 1 tablet by mouth every 8 (eight) hours as needed for severe pain. 11/26/21   [name], MD  thiamine 100 MG tablet Take 1 tablet (100 mg total) by mouth daily. 11/21/21   Swayze, Ava, DO    Allergies: No Known Allergies  Review of Systems: Review of Systems  Constitutional:  Negative for chills and fever.  Respiratory:  Positive for shortness of breath (Baseline, improving). Negative for wheezing.   Cardiovascular:  Negative for chest pain and palpitations.  Gastrointestinal:  Negative for nausea and vomiting.  Musculoskeletal:  Positive for falls.  All other systems reviewed and are negative.  Physical Exam BP 109/72   Pulse 84   Temp (!) 97.5 F (36.4 C) (Oral)   Ht '5\' 7"'$  (1.702 m)   Wt 118 lb 3.2 oz (53.6 kg)   SpO2 92%   BMI 18.51 kg/m   Physical Exam Vitals and nursing note reviewed. Exam conducted with a chaperone present.  Constitutional:      General: She is not in acute distress.    Appearance: Normal appearance. She is not ill-appearing.   HENT:     Head: Normocephalic and atraumatic.     Mouth/Throat:     Mouth: Mucous membranes are moist.     Pharynx: Oropharynx is clear.  Cardiovascular:     Rate and Rhythm: Normal rate. Rhythm irregular.     Pulses: Normal pulses.  Pulmonary:     Effort: Pulmonary effort is normal. No respiratory distress.     Breath sounds: Normal breath sounds.  Genitourinary:    Comments: Deferred Skin:    General: Skin is warm and dry.     Comments: Chest tube site to the left lateral chest wall is healed  Neurological:     Mental Status: She is alert and oriented to person, place, and  time. Mental status is at baseline.  Psychiatric:        Mood and Affect: Mood normal.        Behavior: Behavior normal.    Labs/Imaging:  CXR (12/04/2021) personally reviewed which shows continued resolution in her left pneumothorax, left rib fractures again seen, and radiologist report pending   Assessment and Plan: This is a 81 y.o. female resolved traumatic left pneumothorax with left rib fractures.    -- Nothing further to add from surgical perspective   -- She can follow up with [place] on an as needed basis   Face-to-face time spent with the patient and care providers was 20 minutes, with more than 50% of the time spent counseling, educating, and coordinating care of the patient.     Edison Simon, PA-C [facility] 12/04/2021, 2:43 PM M-F: 7am - 4pm

## 2021-12-04 NOTE — Patient Instructions (Signed)
If you have any concerns or questions, please feel free to call our office.   Rib Fracture  A rib fracture is a break or crack in one of the bones of the ribs. The ribs are like a cage that goes around your upper chest. A broken or cracked rib is often painful, but most do not cause other problems. Most rib fractures usually heal on their own in 1-3 months. What are the causes? Doing movements over and over again with a lot of force, such as pitching a baseball or having a very bad cough. A direct hit to the chest. Cancer that has spread to the bones. What are the signs or symptoms? Pain when you breathe in or cough. Pain when someone presses on the injured area. Feeling short of breath. How is this treated? Treatment depends on how bad the fracture is. In general: Most rib fractures usually heal on their own in 1-3 months. Healing may take longer if you have a cough or are doing activities that make the injury worse. While you heal, you may be given medicines to control pain. You will also be taught deep breathing exercises. Very bad injuries may require a stay at the hospital or surgery. Follow these instructions at home: Managing pain, stiffness, and swelling If told, put ice on the injured area. To do this: Put ice in a plastic bag. Place a towel between your skin and the bag. Leave the ice on for 20 minutes, 2-3 times a day. Take off the ice if your skin turns bright red. This is very important. If you cannot feel pain, heat, or cold, you have a greater risk of damage to the area. Take over-the-counter and prescription medicines only as told by your doctor. Activity Avoid activities that cause pain to the injured area. Protect your injured area. Slowly increase activity as told by your doctor. General instructions Do deep breathing exercises as told by your doctor. You may be told to: Take deep breaths many times a day. Cough several times a day while hugging a pillow. Use a  device (incentive spirometer) to do deep breathing many times a day. Drink enough fluid to keep your pee (urine) clear or pale yellow. Do not wear a rib belt or binder. Keep all follow-up visits. Contact a doctor if: You have a fever. Get help right away if: You have trouble breathing. You are short of breath. You cannot stop coughing. You cough up thick or bloody spit. You feel like you may vomit (nauseous), vomit, or have belly (abdominal) pain. Your pain gets worse and medicine does not help. These symptoms may be an emergency. Get help right away. Call your local emergency services (911 in the [place] Do not wait to see if the symptoms will go away. Do not drive yourself to the hospital. Summary A rib fracture is a break or crack in one of the bones of the ribs. Apply ice to the injured area and take medicines for pain as told by your doctor. Take deep breaths and cough several times a day. Hug a pillow every time you cough. This information is not intended to replace advice given to you by your health care provider. Make sure you discuss any questions you have with your health care provider. Document Revised: 10/27/2019 Document Reviewed: 10/27/2019 Elsevier Patient Education  [place].

## 2021-12-09 ENCOUNTER — Other Ambulatory Visit: Payer: Self-pay | Admitting: Family

## 2021-12-09 ENCOUNTER — Other Ambulatory Visit: Payer: Self-pay | Admitting: Internal Medicine

## 2021-12-09 DIAGNOSIS — F419 Anxiety disorder, unspecified: Secondary | ICD-10-CM

## 2021-12-10 NOTE — Telephone Encounter (Signed)
Pt need refill on LORazepam sent to [facility]

## 2021-12-11 ENCOUNTER — Other Ambulatory Visit (INDEPENDENT_AMBULATORY_CARE_PROVIDER_SITE_OTHER): Payer: Medicare Other

## 2021-12-11 ENCOUNTER — Encounter: Payer: Self-pay | Admitting: Internal Medicine

## 2021-12-11 ENCOUNTER — Ambulatory Visit (INDEPENDENT_AMBULATORY_CARE_PROVIDER_SITE_OTHER): Payer: Medicare Other | Admitting: Internal Medicine

## 2021-12-11 VITALS — BP 110/90 | HR 80 | Ht 67.0 in | Wt 117.0 lb

## 2021-12-11 DIAGNOSIS — R001 Bradycardia, unspecified: Secondary | ICD-10-CM | POA: Diagnosis not present

## 2021-12-11 DIAGNOSIS — I951 Orthostatic hypotension: Secondary | ICD-10-CM

## 2021-12-11 DIAGNOSIS — I4819 Other persistent atrial fibrillation: Secondary | ICD-10-CM | POA: Diagnosis not present

## 2021-12-11 DIAGNOSIS — D472 Monoclonal gammopathy: Secondary | ICD-10-CM

## 2021-12-11 DIAGNOSIS — Z79899 Other long term (current) drug therapy: Secondary | ICD-10-CM | POA: Diagnosis not present

## 2021-12-11 DIAGNOSIS — I10 Essential (primary) hypertension: Secondary | ICD-10-CM

## 2021-12-11 MED ORDER — AMLODIPINE BESYLATE 5 MG PO TABS: 5.0000 mg | ORAL_TABLET | Freq: Every day | ORAL | 2 refills | Status: DC

## 2021-12-11 NOTE — Progress Notes (Signed)
Patient ID: [name], female   DOB: [birth year], 81 y.o.   MRN: [id number]     Patient Care Team: [name], MD as PCP - General (Family Medicine) [name], MD as PCP - Cardiology (Cardiology) [name], MD (Family Medicine)   HPI   [name] is a 81 y.o. female seen in follow-up for recurrent atrial fibrillation, treated with  flecainide >> amiodarone and anticoagulation with dabigitran   Has had hospitalizations related to HFpEF with rapid AF. Recurrent cardioversions. Most recently 3/22 at which time the flecainide was replaced with amiodarone   9/21 ER following a fall while Inebriated ( ER BAL >200) Seen and Discussed with DR TT--drinking at least 2 ounces of vodka at night and some wine.  There have been concerns from the children about drinking too much She have reduced her alcohol to intermittently a glass of wine before her cocktail watching the news  2 interval hospitalizations, 1 for atrial fibrillation with a rapid rate, converted spontaneously, and subsequently for rib fractures  consequential to a mechanical fall.  (Parenthetically on neither occasion was alcohol level measured although she says her alcohol intake has been much much less)   Thromboembolic risk factors ( age  -2, HTN-1, Vasc disease -1, Gender-1) for a CHADSVASc Score of 5   Complaining of worsening shortness of breath over the last 3-6 months or so; she had started taking her diuretics most every day about 3 months ago without significant improvement.  No orthopnea or nocturnal dyspnea.  Mild peripheral edema       husband past away in 2019   DATE TEST EF   2/19 Echo   65 % LAE (48/2.8/46)  9/21 Echo  60-65%   3/22 Echo 50-55% LA normal          Date Cr K TSH LFT  Hgb  2/19 0.8 3.9   12.5  5/19  1.12 3.8     6/20 0.97 4.4   12.4  12/20 0.9  (CE) 2/21 4.2    10.6  9/21 1.11<<1.7 5.0   10.3<<8.5  6/22 1.08 4.6 2.65 (7/22) 19 (7/22) 11.1  5/23 1.03 3.8  3.64 (4/23) 77 10.0   Past Surgical History:  Procedure Laterality Date   ABDOMINAL HYSTERECTOMY     ANKLE FRACTURE SURGERY  4/08   left---hardware still in place   BREAST BIOPSY Left 2001   breast ca   BREAST EXCISIONAL BIOPSY Left yrs ago   benign   BREAST LUMPECTOMY Left 2001   f/u radiation   CARDIOVERSION N/A 10/04/2017   Procedure: CARDIOVERSION;  Surgeon: Wellington Hampshire, MD;  Location: [place] [facility];  Service: Cardiovascular;  Laterality: N/A;   CARDIOVERSION N/A 12/16/2017   Procedure: CARDIOVERSION;  Surgeon: [name], MD;  Location: [facility];  Service: Cardiovascular;  Laterality: N/A;   CARDIOVERSION N/A 06/24/2020   Procedure: CARDIOVERSION;  Surgeon: Wellington Hampshire, MD;  Location: [facility];  Service: Cardiovascular;  Laterality: N/A;   CAROTID ENDARTERECTOMY Left 10/23/2004   FRACTURE SURGERY     OOPHORECTOMY     SHOULDER SURGERY  6/07   left   TONSILLECTOMY AND ADENOIDECTOMY     TOTAL HIP ARTHROPLASTY  2004   right    Current Outpatient Medications  Medication Sig Dispense Refill   acetaminophen (TYLENOL) 325 MG tablet Take 2 tablets (650 mg total) by mouth every 4 (four) hours as needed for headache or mild pain.     allopurinol (ZYLOPRIM)  100 MG tablet TAKE 1.5 TABLETS ('150MG'$  TOTAL) BY MOUTH DAILY 135 tablet 1   amiodarone (PACERONE) 200 MG tablet TAKE 1 TABLET BY MOUTH EVERY DAY 90 tablet 2   amLODipine (NORVASC) 10 MG tablet Take 1 tablet (10 mg total) by mouth daily. 90 tablet 3   apixaban (ELIQUIS) 2.5 MG TABS tablet Take 1 tablet (2.5 mg total) by mouth 2 (two) times daily. 60 tablet 6   cholecalciferol (VITAMIN D) 25 MCG (1000 UNIT) tablet Take 2 tablets (2,000 Units total) by mouth daily.     furosemide (LASIX) 40 MG tablet Take 0.5 tablet (20 mg) by mouth once daily as needed for swelling 45 tablet 2   LORazepam (ATIVAN) 0.5 MG tablet TAKE 1 TABLET BY MOUTH TWICE A DAY AS NEEDED FOR ANXIETY 60 tablet 0   mirtazapine (REMERON) 15 MG tablet TAKE 1  TABLET BY MOUTH EVERYDAY AT BEDTIME 90 tablet 3   Multiple Vitamin (MULTIVITAMIN) capsule Take 1 capsule by mouth daily.     Multiple Vitamins-Minerals (PRESERVISION AREDS 2+MULTI VIT PO) Take 1 capsule by mouth in the morning and at bedtime.     No current facility-administered medications for this visit.    No Known Allergies  Review of Systems negative except from HPI and PMH  Physical Exam: BP 110/90 (BP Location: Left Arm, Patient Position: Sitting, Cuff Size: Normal)   Pulse 80   Ht '5\' 7"'$  (1.702 m)   Wt 117 lb (53.1 kg)   SpO2 97%   BMI 18.32 kg/m  Well developed and nourished in no acute distress HENT normal Neck supple with JVP-  flat   Clear Regular rate and rhythm, 2/6 murmur varies with RR Abd-soft with active BS No Clubbing cyanosis 1+edema Skin-warm and dry A & Oriented  Grossly normal sensory and motor function  ECG sinus at 80 Intervals 19/08/43 Assessment and  Plan  Atrial fibrillation-persistent with a rapid rate  Sinus brady  Dyspnea on exertion  Alcohol use/ (As above)   Anemia  elevated MCV, iron labs normal  Renal insufficiency grade 3  Hypertension   Orthostatic hypotension  Peripheral vascular disease carotid artery occlusion  Alcohol intake-exuberant in the past  Atrial fibrillation with interval paroxysms of rapid rate.  We will continue the amiodarone at 200 mg a day.  Surveillance laboratories are within normal limits.  Hemoglobin has been relatively stable.  Continue her Eliquis at 2.5 mg.  B12 and folate levels have been normal within the last year not withstanding the elevated MCV.  Iron labs were also normal.  Her transaminases were elevated in April.  We will plan to repeat.  Blood pressure has been on the lower side, will decrease amlodipine from 10--5.  Orthostatic vital signs today were impressively normal.  Worsening dyspnea on exertion; in the past has had normal LV function.  We will check again.

## 2021-12-11 NOTE — Patient Instructions (Signed)
Medication Instructions:  - Your physician has recommended you make the following change in your medication:   1) DECREASE Norvasc (Amlodipine) 5 mg: - take 1 tablet by mouth once daily    *If you need a refill on your cardiac medications before your next appointment, please call your pharmacy*   Lab Work: - none ordered  If you have labs (blood work) drawn today and your tests are completely normal, you will receive your results only by: [place] (if you have MyChart) OR A paper copy in the mail If you have any lab test that is abnormal or we need to change your treatment, we will call you to review the results.   Testing/Procedures:  1) Echocardiogram: - Your physician has requested that you have an echocardiogram. Echocardiography is a painless test that uses sound waves to create images of your heart. It provides your doctor with information about the size and shape of your heart and how well your heart's chambers and valves are working. This procedure takes approximately one hour. There are no restrictions for this procedure. There is a possibility that an IV may need to be started during your test to inject an image enhancing agent. This is done to obtain more optimal pictures of your heart. Therefore we ask that you do at least drink some water prior to coming in to hydrate your veins.    Follow-Up: At [facility], you and your health needs are our priority.  As part of our continuing mission to provide you with exceptional heart care, we have created designated Provider Care Teams.  These Care Teams include your primary Cardiologist (physician) and Advanced Practice Providers (APPs -  Physician Assistants and Nurse Practitioners) who all work together to provide you with the care you need, when you need it.  We recommend signing up for the patient portal called "MyChart".  Sign up information is provided on this After Visit Summary.  MyChart is used to connect with  patients for Virtual Visits (Telemedicine).  Patients are able to view lab/test results, encounter notes, upcoming appointments, etc.  Non-urgent messages can be sent to your provider as well.   To learn more about what you can do with MyChart, go to [email or web address].    Your next appointment:   6 month(s)  The format for your next appointment:   In Person  Provider:   Virl Axe, MD    Other Instructions  Echocardiogram An echocardiogram is a test that uses sound waves (ultrasound) to produce images of the heart. Images from an echocardiogram can provide important information about: Heart size and shape. The size and thickness and movement of your heart's walls. Heart muscle function and strength. Heart valve function or if you have stenosis. Stenosis is when the heart valves are too narrow. If blood is flowing backward through the heart valves (regurgitation). A tumor or infectious growth around the heart valves. Areas of heart muscle that are not working well because of poor blood flow or injury from a heart attack. Aneurysm detection. An aneurysm is a weak or damaged part of an artery wall. The wall bulges out from the normal force of blood pumping through the body. Tell a health care provider about: Any allergies you have. All medicines you are taking, including vitamins, herbs, eye drops, creams, and over-the-counter medicines. Any blood disorders you have. Any surgeries you have had. Any medical conditions you have. Whether you are pregnant or may be pregnant. What are the risks? Generally, this  is a safe test. However, problems may occur, including an allergic reaction to dye (contrast) that may be used during the test. What happens before the test? No specific preparation is needed. You may eat and drink normally. What happens during the test?  You will take off your clothes from the waist up and put on a hospital gown. Electrodes or electrocardiogram  (ECG)patches may be placed on your chest. The electrodes or patches are then connected to a device that monitors your heart rate and rhythm. You will lie down on a table for an ultrasound exam. A gel will be applied to your chest to help sound waves pass through your skin. A handheld device, called a transducer, will be pressed against your chest and moved over your heart. The transducer produces sound waves that travel to your heart and bounce back (or "echo" back) to the transducer. These sound waves will be captured in real-time and changed into images of your heart that can be viewed on a video monitor. The images will be recorded on a computer and reviewed by your health care provider. You may be asked to change positions or hold your breath for a short time. This makes it easier to get different views or better views of your heart. In some cases, you may receive contrast through an IV in one of your veins. This can improve the quality of the pictures from your heart. The procedure may vary among health care providers and hospitals. What can I expect after the test? You may return to your normal, everyday life, including diet, activities, and medicines, unless your health care provider tells you not to do that. Follow these instructions at home: It is up to you to get the results of your test. Ask your health care provider, or the department that is doing the test, when your results will be ready. Keep all follow-up visits. This is important. Summary An echocardiogram is a test that uses sound waves (ultrasound) to produce images of the heart. Images from an echocardiogram can provide important information about the size and shape of your heart, heart muscle function, heart valve function, and other possible heart problems. You do not need to do anything to prepare before this test. You may eat and drink normally. After the echocardiogram is completed, you may return to your normal, everyday  life, unless your health care provider tells you not to do that. This information is not intended to replace advice given to you by your health care provider. Make sure you discuss any questions you have with your health care provider. Document Revised: 03/19/2021 Document Reviewed: 02/27/2020 Elsevier Patient Education  [place]

## 2021-12-12 ENCOUNTER — Other Ambulatory Visit: Payer: Self-pay | Admitting: Family Medicine

## 2021-12-12 DIAGNOSIS — F419 Anxiety disorder, unspecified: Secondary | ICD-10-CM

## 2021-12-12 MED ORDER — LORAZEPAM 0.5 MG PO TABS: ORAL_TABLET | ORAL | 0 refills | Status: DC

## 2021-12-12 NOTE — Telephone Encounter (Signed)
Patient called and said she will need her LORazepam (ATIVAN) 0.5 MG tablet refill before this weekend, she will be out.

## 2021-12-12 NOTE — Telephone Encounter (Signed)
Refilled: 09/22/2021 Last OV: 11/24/2021 Next OV: 02/27/2022

## 2021-12-17 LAB — IMMUNOFIXATION, SERUM
IgA/Immunoglobulin A, Serum: 171 mg/dL (ref 64–422)
IgG (Immunoglobin G), Serum: 309 mg/dL — ABNORMAL LOW (ref 586–1602)
IgM (Immunoglobulin M), Srm: 1671 mg/dL — ABNORMAL HIGH (ref 26–217)

## 2021-12-19 ENCOUNTER — Telehealth: Payer: Self-pay | Admitting: Internal Medicine

## 2021-12-19 DIAGNOSIS — I4819 Other persistent atrial fibrillation: Secondary | ICD-10-CM

## 2021-12-19 DIAGNOSIS — Z79899 Other long term (current) drug therapy: Secondary | ICD-10-CM

## 2021-12-19 NOTE — Telephone Encounter (Signed)
Message received from Dr. Caryl Comes after the patient's office visit with him on 12/11/21. Per MD- he would like the patient to have a repeat Liver function panel done the day of her echocardiogram. LFT's slightly abnormal in April and wants to insure stability due to amiodarone use.  Lab orders placed.  Attempted to call the patient. No answer- I left a detailed message of the above recommendations on her identified voice mail regarding a repeat lab draw.  I advised no need to call back unless she has any questions.  Advised the lab draw can be done at the: [place] [facility] at [facility] 1st desk on the right to check in (REGISTRATION)  Lab hours: Monday- Friday (7:30 am- 5:30 pm)

## 2021-12-29 ENCOUNTER — Ambulatory Visit (INDEPENDENT_AMBULATORY_CARE_PROVIDER_SITE_OTHER): Payer: Medicare Other

## 2021-12-29 ENCOUNTER — Telehealth: Payer: Self-pay | Admitting: Internal Medicine

## 2021-12-29 DIAGNOSIS — I4819 Other persistent atrial fibrillation: Secondary | ICD-10-CM

## 2021-12-29 LAB — ECHOCARDIOGRAM COMPLETE
AR max vel: 3.28 cm2
AV Area VTI: 3.5 cm2
AV Area mean vel: 3.04 cm2
AV Mean grad: 2 mmHg
AV Peak grad: 3.4 mmHg
Ao pk vel: 0.92 m/s
Area-P 1/2: 3.36 cm2
Calc EF: 50.3 %
S' Lateral: 3.2 cm
Single Plane A2C EF: 48.8 %
Single Plane A4C EF: 55.8 %

## 2021-12-29 NOTE — Telephone Encounter (Signed)
Reviewed patients chart and do not see where anyone had called her today. Advised we would call her once results are available. She was appreciative for the call back with no further needs at this time.

## 2021-12-29 NOTE — Telephone Encounter (Signed)
Patient states she received a call from our office, a voice mail was not left. She did come in today for an echo.

## 2021-12-30 ENCOUNTER — Telehealth: Payer: Self-pay | Admitting: Family Medicine

## 2021-12-30 NOTE — Telephone Encounter (Signed)
Please let the patient know that I heard back from Dr. Grayland Ormond on her recent abnormal labs.  He noted to just continue to monitor her lab work.

## 2021-12-30 NOTE — Telephone Encounter (Signed)
-----   Message from Lloyd Huger, MD sent at 12/29/2021  6:22 AM EDT ----- Regarding: RE: Elevated IgM Lyna Poser, I would just continue to follow her.  The next step would be a BMBx, which is not necessary at this point and I suspect the patient would refuse anyway.  Thanks!  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 At your last visit she wanted to have her follow-up labs with me and deferred follow-up with you at that time. I got her SPEP as you recommended and her immunofixation revealed her IgM has increased further. Is there anything else I need to do for follow-up on this or does she need to come back to see you given the increase? Thanks for your help.   Randall Hiss

## 2021-12-30 NOTE — Telephone Encounter (Signed)
I called and informed the patient that the provider stated he spoke with Dr. Maryjane Hurter and they will just continue to monitor her labs and she understood. Eduardo Honor,cma

## 2022-01-01 ENCOUNTER — Ambulatory Visit (INDEPENDENT_AMBULATORY_CARE_PROVIDER_SITE_OTHER): Payer: Medicare Other | Admitting: Podiatry

## 2022-01-01 ENCOUNTER — Encounter: Payer: Self-pay | Admitting: Podiatry

## 2022-01-01 DIAGNOSIS — M79676 Pain in unspecified toe(s): Secondary | ICD-10-CM

## 2022-01-01 DIAGNOSIS — I872 Venous insufficiency (chronic) (peripheral): Secondary | ICD-10-CM

## 2022-01-01 DIAGNOSIS — B351 Tinea unguium: Secondary | ICD-10-CM | POA: Diagnosis not present

## 2022-01-01 DIAGNOSIS — D689 Coagulation defect, unspecified: Secondary | ICD-10-CM

## 2022-01-01 NOTE — Progress Notes (Signed)
This patient returns to my office for at risk foot care.  This patient requires this care by a professional since this patient will be at risk due to having lymphedema.  This patient is unable to cut nails herself since the patient cannot reach her nails.These nails are painful walking and wearing shoes.  This patient presents for at risk foot care today.  General Appearance  Alert, conversant and in no acute stress.  Vascular  Dorsalis pedis and posterior tibial  pulses are palpable  bilaterally.  Capillary return is within normal limits  bilaterally. Temperature is within normal limits  bilaterally.  Neurologic  Senn-Weinstein monofilament wire test within normal limits  bilaterally. Muscle power within normal limits bilaterally.  Nails Thick disfigured discolored nails with subungual debris  from hallux to fifth toes bilaterally. No evidence of bacterial infection or drainage bilaterally.  Orthopedic  No limitations of motion  feet .  No crepitus or effusions noted.  No bony pathology or digital deformities noted.  HAV  B/L.  Mallet toe third toe left foot.  Skin  normotropic skin with no porokeratosis noted bilaterally.  No signs of infections or ulcers noted.     Onychomycosis  Pain in right toes  Pain in left toes  Consent was obtained for treatment procedures.   Mechanical debridement of nails 1-5  bilaterally performed with a nail nipper.  Filed with dremel without incident.     Return office visit    3 months                  Told patient to return for periodic foot care and evaluation due to potential at risk complications.   Tamirah George DPM  

## 2022-01-02 ENCOUNTER — Telehealth: Payer: Self-pay

## 2022-01-02 NOTE — Telephone Encounter (Signed)
Spoke with pt and advised per Dr Caryl Comes, echo is normal.  Pt verbalizes understanding and states she continues to have SOB and is wanting to know if there is anything else that can be done for this problem.  Reviewed ED precautions and will forward to Dr Caryl Comes and Nira Conn, RN for further review and recommendation.  Pt verbalizes understanding and thanked Therapist, sports for the call.

## 2022-01-02 NOTE — Telephone Encounter (Signed)
-----   Message from [name], MD sent at 12/31/2021  6:12 PM EDT ----- Please Inform Patient Echo showed  normal heart muscle function

## 2022-01-06 NOTE — Telephone Encounter (Signed)
To Dr. Caryl Comes to review.   The patient has been on amiodarone for some time now- started during her March 2022 hospitalization.   Most recent Chest X-ray 12/05/21 IMPRESSION: No evidence of pneumothorax.   Small left pleural effusion is noted.   - The patient has not had PFT's pre/ post amiodarone initiation - No high resolution chest CT on file  Unsure if the patient's SOB is due/ aggravated by amiodarone therapy. I do not see that she has seen Pulmonary in the past.   Will await MD recommendations prior to calling the patient back.

## 2022-01-07 ENCOUNTER — Other Ambulatory Visit: Payer: Self-pay | Admitting: Family Medicine

## 2022-01-07 DIAGNOSIS — M1A9XX1 Chronic gout, unspecified, with tophus (tophi): Secondary | ICD-10-CM

## 2022-01-08 ENCOUNTER — Ambulatory Visit
Admission: RE | Admit: 2022-01-08 | Discharge: 2022-01-08 | Disposition: A | Discharge status: Home / Self Care | Payer: Medicare Other | Source: Ambulatory Visit | Attending: Family Medicine | Admitting: Family Medicine

## 2022-01-08 DIAGNOSIS — M1611 Unilateral primary osteoarthritis, right hip: Secondary | ICD-10-CM | POA: Insufficient documentation

## 2022-01-08 DIAGNOSIS — Y939 Activity, unspecified: Secondary | ICD-10-CM | POA: Diagnosis not present

## 2022-01-08 DIAGNOSIS — Z853 Personal history of malignant neoplasm of breast: Secondary | ICD-10-CM | POA: Diagnosis not present

## 2022-01-08 DIAGNOSIS — M85851 Other specified disorders of bone density and structure, right thigh: Secondary | ICD-10-CM | POA: Insufficient documentation

## 2022-01-08 DIAGNOSIS — S2242XK Multiple fractures of ribs, left side, subsequent encounter for fracture with nonunion: Secondary | ICD-10-CM

## 2022-01-08 DIAGNOSIS — R636 Underweight: Secondary | ICD-10-CM | POA: Insufficient documentation

## 2022-01-08 DIAGNOSIS — M810 Age-related osteoporosis without current pathological fracture: Secondary | ICD-10-CM | POA: Insufficient documentation

## 2022-01-08 DIAGNOSIS — Z96641 Presence of right artificial hip joint: Secondary | ICD-10-CM | POA: Diagnosis not present

## 2022-01-08 DIAGNOSIS — Z780 Asymptomatic menopausal state: Secondary | ICD-10-CM | POA: Diagnosis not present

## 2022-01-08 DIAGNOSIS — Z8781 Personal history of (healed) traumatic fracture: Secondary | ICD-10-CM | POA: Diagnosis not present

## 2022-01-12 ENCOUNTER — Emergency Department: Payer: Medicare Other

## 2022-01-12 ENCOUNTER — Other Ambulatory Visit: Payer: Self-pay

## 2022-01-12 ENCOUNTER — Observation Stay
Admission: EM | Admit: 2022-01-12 | Discharge: 2022-01-13 | Disposition: A | Discharge status: Home / Self Care | Payer: Medicare Other | Attending: Hospitalist | Admitting: Hospitalist

## 2022-01-12 DIAGNOSIS — I5032 Chronic diastolic (congestive) heart failure: Secondary | ICD-10-CM | POA: Diagnosis not present

## 2022-01-12 DIAGNOSIS — Z79899 Other long term (current) drug therapy: Secondary | ICD-10-CM | POA: Diagnosis not present

## 2022-01-12 DIAGNOSIS — I110 Hypertensive heart disease with heart failure: Secondary | ICD-10-CM | POA: Insufficient documentation

## 2022-01-12 DIAGNOSIS — R000 Tachycardia, unspecified: Secondary | ICD-10-CM | POA: Diagnosis not present

## 2022-01-12 DIAGNOSIS — I4891 Unspecified atrial fibrillation: Secondary | ICD-10-CM

## 2022-01-12 DIAGNOSIS — I5033 Acute on chronic diastolic (congestive) heart failure: Secondary | ICD-10-CM | POA: Insufficient documentation

## 2022-01-12 DIAGNOSIS — Z853 Personal history of malignant neoplasm of breast: Secondary | ICD-10-CM | POA: Diagnosis not present

## 2022-01-12 DIAGNOSIS — I480 Paroxysmal atrial fibrillation: Principal | ICD-10-CM | POA: Insufficient documentation

## 2022-01-12 DIAGNOSIS — Z87891 Personal history of nicotine dependence: Secondary | ICD-10-CM | POA: Diagnosis not present

## 2022-01-12 DIAGNOSIS — Z96641 Presence of right artificial hip joint: Secondary | ICD-10-CM | POA: Diagnosis not present

## 2022-01-12 DIAGNOSIS — Z7901 Long term (current) use of anticoagulants: Secondary | ICD-10-CM | POA: Diagnosis not present

## 2022-01-12 DIAGNOSIS — R0602 Shortness of breath: Secondary | ICD-10-CM | POA: Diagnosis not present

## 2022-01-12 DIAGNOSIS — I4819 Other persistent atrial fibrillation: Secondary | ICD-10-CM

## 2022-01-12 DIAGNOSIS — R0902 Hypoxemia: Secondary | ICD-10-CM | POA: Diagnosis not present

## 2022-01-12 LAB — TSH
TSH: 1.866 u[IU]/mL (ref 0.350–4.500)
TSH: 1.259 u[IU]/mL (ref 0.350–4.500)

## 2022-01-12 LAB — COMPREHENSIVE METABOLIC PANEL
ALT: 19 U/L (ref 0–44)
AST: 41 U/L (ref 15–41)
Albumin: 3.5 g/dL (ref 3.5–5.0)
Alkaline Phosphatase: 72 U/L (ref 38–126)
Anion gap: 9 (ref 5–15)
BUN: 17 mg/dL (ref 8–23)
CO2: 24 mmol/L (ref 22–32)
Calcium: 8.5 mg/dL — ABNORMAL LOW (ref 8.9–10.3)
Chloride: 107 mmol/L (ref 98–111)
Creatinine, Ser: 0.93 mg/dL (ref 0.44–1.00)
GFR, Estimated: >60 mL/min (ref >60)
Glucose, Bld: 85 mg/dL (ref 70–99)
Potassium: 4.9 mmol/L (ref 3.5–5.1)
Sodium: 140 mmol/L (ref 135–145)
Total Bilirubin: 0.9 mg/dL (ref 0.3–1.2)
Total Protein: 7 g/dL (ref 6.5–8.1)

## 2022-01-12 LAB — CBC WITH DIFFERENTIAL/PLATELET
Abs Immature Granulocytes: 0.02 10*3/uL (ref 0.00–0.07)
Basophils Absolute: 0.1 10*3/uL (ref 0.0–0.1)
Basophils Relative: 1 %
Eosinophils Absolute: 0.1 10*3/uL (ref 0.0–0.5)
Eosinophils Relative: 2 %
HCT: 36.9 % (ref 36.0–46.0)
Hemoglobin: 11.9 g/dL — ABNORMAL LOW (ref 12.0–15.0)
Immature Granulocytes: 0 %
Lymphocytes Relative: 30 %
Lymphs Abs: 1.8 10*3/uL (ref 0.7–4.0)
MCH: 33.8 pg (ref 26.0–34.0)
MCHC: 32.2 g/dL (ref 30.0–36.0)
MCV: 104.8 fL — ABNORMAL HIGH (ref 80.0–100.0)
Monocytes Absolute: 0.7 10*3/uL (ref 0.1–1.0)
Monocytes Relative: 13 %
Neutro Abs: 3.2 10*3/uL (ref 1.7–7.7)
Neutrophils Relative %: 54 %
Platelets: 291 10*3/uL (ref 150–400)
RBC: 3.52 MIL/uL — ABNORMAL LOW (ref 3.87–5.11)
RDW: 15.8 % — ABNORMAL HIGH (ref 11.5–15.5)
WBC: 5.9 10*3/uL (ref 4.0–10.5)
nRBC: 0 % (ref 0.0–0.2)

## 2022-01-12 LAB — TROPONIN I (HIGH SENSITIVITY)
Troponin I (High Sensitivity): 8 ng/L (ref <18)
Troponin I (High Sensitivity): 9 ng/L (ref <18)

## 2022-01-12 LAB — PROCALCITONIN: Procalcitonin: <0.1 ng/mL

## 2022-01-12 MED ORDER — SODIUM CHLORIDE 0.9 % IV SOLN: 250.0000 mL | INTRAVENOUS | Status: DC | PRN

## 2022-01-12 MED ORDER — MIRTAZAPINE 15 MG PO TABS
15.0000 mg | ORAL_TABLET | Freq: Every day | ORAL | Status: DC
Start: 2022-01-12 — End: 2022-01-13
  Administered 2022-01-12: 15 mg via ORAL
  Filled 2022-01-12: qty 1

## 2022-01-12 MED ORDER — ACETAMINOPHEN 325 MG PO TABS: 650.0000 mg | ORAL_TABLET | ORAL | Status: DC | PRN

## 2022-01-12 MED ORDER — ACETAMINOPHEN 325 MG PO TABS
650.0000 mg | ORAL_TABLET | ORAL | Status: DC | PRN
  Administered 2022-01-12: 650 mg via ORAL
  Filled 2022-01-12: qty 2

## 2022-01-12 MED ORDER — APIXABAN 2.5 MG PO TABS
2.5000 mg | ORAL_TABLET | Freq: Two times a day (BID) | ORAL | Status: DC
  Administered 2022-01-12 – 2022-01-13 (×3): 2.5 mg via ORAL
  Filled 2022-01-12 (×3): qty 1

## 2022-01-12 MED ORDER — ACETAMINOPHEN 325 MG PO TABS: 650.0000 mg | ORAL_TABLET | Freq: Four times a day (QID) | ORAL | Status: DC | PRN

## 2022-01-12 MED ORDER — SODIUM CHLORIDE 0.9% FLUSH: 3.0000 mL | INTRAVENOUS | Status: DC | PRN

## 2022-01-12 MED ORDER — SODIUM CHLORIDE 0.9% FLUSH
3.0000 mL | Freq: Two times a day (BID) | INTRAVENOUS | Status: DC
  Administered 2022-01-12 (×2): 3 mL via INTRAVENOUS

## 2022-01-12 MED ORDER — ONDANSETRON HCL 4 MG/2ML IJ SOLN: 4.0000 mg | Freq: Four times a day (QID) | INTRAMUSCULAR | Status: DC | PRN

## 2022-01-12 MED ORDER — ORAL CARE MOUTH RINSE: 15.0000 mL | OROMUCOSAL | Status: DC | PRN

## 2022-01-12 MED ORDER — DILTIAZEM HCL 25 MG/5ML IV SOLN
10.0000 mg | Freq: Once | INTRAVENOUS | Status: AC
Start: 2022-01-12 — End: 2022-01-12
  Administered 2022-01-12: 10 mg via INTRAVENOUS
  Filled 2022-01-12: qty 5

## 2022-01-12 MED ORDER — AMIODARONE HCL IN DEXTROSE 360-4.14 MG/200ML-% IV SOLN
60.0000 mg/h | INTRAVENOUS | Status: AC
  Administered 2022-01-12: 60 mg/h via INTRAVENOUS
  Filled 2022-01-12: qty 200

## 2022-01-12 MED ORDER — DILTIAZEM HCL-DEXTROSE 125-5 MG/125ML-% IV SOLN (PREMIX)
5.0000 mg/h | INTRAVENOUS | Status: DC
  Administered 2022-01-12: 5 mg/h via INTRAVENOUS
  Administered 2022-01-12: 10 mg/h via INTRAVENOUS
  Filled 2022-01-12 (×2): qty 125

## 2022-01-12 MED ORDER — LORAZEPAM 0.5 MG PO TABS
0.5000 mg | ORAL_TABLET | Freq: Two times a day (BID) | ORAL | Status: DC | PRN
  Administered 2022-01-12: 0.5 mg via ORAL
  Filled 2022-01-12: qty 1

## 2022-01-12 MED ORDER — AMIODARONE HCL 200 MG PO TABS
200.0000 mg | ORAL_TABLET | Freq: Every day | ORAL | Status: DC
  Administered 2022-01-12: 200 mg via ORAL
  Filled 2022-01-12: qty 1

## 2022-01-12 MED ORDER — AMIODARONE HCL IN DEXTROSE 360-4.14 MG/200ML-% IV SOLN
30.0000 mg/h | INTRAVENOUS | Status: DC
  Administered 2022-01-12: 30 mg/h via INTRAVENOUS
  Filled 2022-01-12: qty 200

## 2022-01-12 MED ORDER — ALLOPURINOL 100 MG PO TABS
150.0000 mg | ORAL_TABLET | Freq: Every day | ORAL | Status: DC
  Administered 2022-01-12 – 2022-01-13 (×2): 150 mg via ORAL
  Filled 2022-01-12: qty 2
  Filled 2022-01-12: qty 0.5

## 2022-01-12 NOTE — H&P (Signed)
History and Physical    [name] GNF:[id number] DOB: 10-Dec-1940 DOA: 01/12/2022  PCP: Glori Luis, MD (Confirm with patient/family/NH records and if not entered, this has to be entered at [facility] point of entry) Patient coming from: Home  I have personally briefly reviewed patient's old medical records in [facility]  Chief Complaint: Palpitations, shortness of breath    HPI: [name] is a 81 y.o. female with medical history significant of PAF on Eliquis, HFpEF, gout, breast cancer, HTN, HLD, came with palpitations and shortness of breath.  Symptoms started this morning, with new onset of palpitations and her Apple Watch showed that she is in A-fib, along with shortness of breath no cough no fever or chills.  No chest pains.  At baseline, patient has had persistent dyspnea exertional, she reported walking around her house for less than 5 minutes because significant shortness of breath.  She was evaluated by cardiology recently for that, and her echo appears to normal and x-ray showed no abnormalities.  She does wear her Apple Watch every day and before today, there was no A-fib reported.  ED Course: In rapid A-fib, blood pressure elevated, O2 saturation 100% on room air.  X-ray suspicious for left midlung infiltrates.  CBC, BMP largely normal.  Review of Systems: As per HPI otherwise 14 point review of systems negative.    Past Medical History:  Diagnosis Date   (HFpEF) heart failure with preserved ejection fraction (HCC)    a. 08/2017 Echo: EF 55-60%, no rwma, mild MR, mildly dil LA, nl RV fxn; b. 03/2020 Echo: EF 60-65%, no rwma, Gr2 DD. RVSP 44.5mmHg. Mod dil LA. Mild MR; c. 09/2020 Echo: EF 50-55%, no rwma, mild LVH, mod red RV fxn, mildly dily RA.   Acute CHF (congestive heart failure) (HCC) 09/18/2020   Alcohol abuse 04/05/2020   Breast cancer (HCC) 2001   left breast   Cancer (HCC) 2001   left breast ca   Carotid arterial disease (HCC)    a. 10/2004 s/p  L CEA; b. 12/2015 Carotid U/S: RICA 1-39%; b. LICA patent CEA site.   Closed right hip fracture (HCC) 09/20/2019   GERD (gastroesophageal reflux disease)    Hyperlipidemia    Hypertension    Osteoarthritis, multiple sites    Osteopenia    Persistent atrial fibrillation (HCC)    a. Dx 08/2017; b. CHA2DS2VASc = 6-->Pradaxa; c. 09/2017 Successful DCCV (second shock - 200J); d. 10/2017 Recurrent Afib-->flecainide started 11/2017; e. 06/2020 s/p DCCV (200J x 1); f. 06/2020 Recurrent AF->Flec inc 75bid; g. 09/2020 WCT->flec d/c'd->amio started.   Personal history of radiation therapy 2001   left breast ca   Psoriasis    Wide-complex tachycardia    a. 09/2020 in setting of presumed Flecainide toxicity.  Flecainide d/c'd.    Past Surgical History:  Procedure Laterality Date   ABDOMINAL HYSTERECTOMY     ANKLE FRACTURE SURGERY  4/08   left---hardware still in place   BREAST BIOPSY Left 2001   breast ca   BREAST EXCISIONAL BIOPSY Left yrs ago   benign   BREAST LUMPECTOMY Left 2001   f/u radiation   CARDIOVERSION N/A 10/04/2017   Procedure: CARDIOVERSION;  Surgeon: Iran Ouch, MD;  Location: [facility] ORS;  Service: Cardiovascular;  Laterality: N/A;   CARDIOVERSION N/A 12/16/2017   Procedure: CARDIOVERSION;  Surgeon: Duke Salvia, MD;  Location: [facility] ORS;  Service: Cardiovascular;  Laterality: N/A;   CARDIOVERSION N/A 06/24/2020   Procedure: CARDIOVERSION;  Surgeon:  Iran Ouch, MD;  Location: [facility] ORS;  Service: Cardiovascular;  Laterality: N/A;   CAROTID ENDARTERECTOMY Left 10/23/2004   FRACTURE SURGERY     OOPHORECTOMY     SHOULDER SURGERY  6/07   left   TONSILLECTOMY AND ADENOIDECTOMY     TOTAL HIP ARTHROPLASTY  2004   right     reports that she quit smoking about 26 years ago. Her smoking use included cigarettes. She has a 40.00 pack-year smoking history. She has never used smokeless tobacco. She reports current alcohol use of about 2.0 standard drinks of alcohol per week. She  reports that she does not use drugs.  No Known Allergies  Family History  Problem Relation Age of Onset   Hypertension Brother    Pneumonia Mother    Leukemia Father    Heart disease Neg Hx    Diabetes Neg Hx    Breast cancer Neg Hx      Prior to Admission medications   Medication Sig Start Date End Date Taking? Authorizing Provider  acetaminophen (TYLENOL) 325 MG tablet Take 2 tablets (650 mg total) by mouth every 4 (four) hours as needed for headache or mild pain. 09/19/20   Swayze, Ava, DO  allopurinol (ZYLOPRIM) 100 MG tablet TAKE 1.5 TABLETS (150MG  TOTAL) BY MOUTH DAILY 01/07/22   Glori Luis, MD  amiodarone (PACERONE) 200 MG tablet TAKE 1 TABLET BY MOUTH EVERY DAY 04/04/21   Georgie Chard D, NP  amLODipine (NORVASC) 5 MG tablet Take 1 tablet (5 mg total) by mouth daily. 12/11/21 03/11/22  Duke Salvia, MD  apixaban (ELIQUIS) 2.5 MG TABS tablet Take 1 tablet (2.5 mg total) by mouth 2 (two) times daily. 09/09/21   Duke Salvia, MD  cholecalciferol (VITAMIN D) 25 MCG (1000 UNIT) tablet Take 2 tablets (2,000 Units total) by mouth daily. 09/20/20   Swayze, Ava, DO  furosemide (LASIX) 40 MG tablet Take 0.5 tablet (20 mg) by mouth once daily as needed for swelling 08/14/21   Duke Salvia, MD  LORazepam (ATIVAN) 0.5 MG tablet TAKE 1 TABLET BY MOUTH TWICE A DAY AS NEEDED FOR ANXIETY 12/12/21   Glori Luis, MD  mirtazapine (REMERON) 15 MG tablet TAKE 1 TABLET BY MOUTH EVERYDAY AT BEDTIME 09/22/21   Worthy Rancher B, FNP  Multiple Vitamin (MULTIVITAMIN) capsule Take 1 capsule by mouth daily.    [provider]  Multiple Vitamins-Minerals (PRESERVISION AREDS 2+MULTI VIT PO) Take 1 capsule by mouth in the morning and at bedtime.    [provider]    Physical Exam: Vitals:   01/12/22 0754 01/12/22 0801 01/12/22 0822 01/12/22 0830  BP:  (!) 145/96  (!) 160/82  Pulse: (!) 129 (!) 128 (!) 112 (!) 109  Resp: (!) 24 17 (!) 21 (!) 22  Temp:  97.8 F (36.6 C)     TempSrc:  Oral    SpO2: 99% 100% 98% 96%  Height: 5\' 7"  (1.702 m)       Constitutional: NAD, calm, comfortable Vitals:   01/12/22 0754 01/12/22 0801 01/12/22 0822 01/12/22 0830  BP:  (!) 145/96  (!) 160/82  Pulse: (!) 129 (!) 128 (!) 112 (!) 109  Resp: (!) 24 17 (!) 21 (!) 22  Temp:  97.8 F (36.6 C)    TempSrc:  Oral    SpO2: 99% 100% 98% 96%  Height: 5\' 7"  (1.702 m)      Eyes: PERRL, lids and conjunctivae normal ENMT: Mucous membranes are moist. Posterior pharynx  clear of any exudate or lesions.Normal dentition.  Neck: normal, supple, no masses, no thyromegaly Respiratory: clear to auscultation bilaterally, no wheezing, no crackles. Normal respiratory effort. No accessory muscle use.  Cardiovascular: Irregular heart rate, no murmurs / rubs / gallops. No extremity edema. 2+ pedal pulses. No carotid bruits.  Abdomen: no tenderness, no masses palpated. No hepatosplenomegaly. Bowel sounds positive.  Musculoskeletal: no clubbing / cyanosis. No joint deformity upper and lower extremities. Good ROM, no contractures. Normal muscle tone.  Skin: no rashes, lesions, ulcers. No induration Neurologic: CN 2-12 grossly intact. Sensation intact, DTR normal. Strength 5/5 in all 4.  Psychiatric: Normal judgment and insight. Alert and oriented x 3. Normal mood.    Labs on Admission: I have personally reviewed following labs and imaging studies  CBC: Recent Labs  Lab 01/12/22 0756  WBC 5.9  NEUTROABS 3.2  HGB 11.9*  HCT 36.9  MCV 104.8*  PLT 291   Basic Metabolic Panel: Recent Labs  Lab 01/12/22 0756  NA 140  K 4.9  CL 107  CO2 24  GLUCOSE 85  BUN 17  CREATININE 0.93  CALCIUM 8.5*   GFR: CrCl cannot be calculated (Unknown ideal weight.). Liver Function Tests: Recent Labs  Lab 01/12/22 0756  AST 41  ALT 19  ALKPHOS 72  BILITOT 0.9  PROT 7.0  ALBUMIN 3.5   No results for input(s): "LIPASE", "AMYLASE" in the last 168 hours. No results for input(s): "AMMONIA" in the  last 168 hours. Coagulation Profile: No results for input(s): "INR", "PROTIME" in the last 168 hours. Cardiac Enzymes: No results for input(s): "CKTOTAL", "CKMB", "CKMBINDEX", "TROPONINI" in the last 168 hours. BNP (last 3 results) No results for input(s): "PROBNP" in the last 8760 hours. HbA1C: No results for input(s): "HGBA1C" in the last 72 hours. CBG: No results for input(s): "GLUCAP" in the last 168 hours. Lipid Profile: No results for input(s): "CHOL", "HDL", "LDLCALC", "TRIG", "CHOLHDL", "LDLDIRECT" in the last 72 hours. Thyroid Function Tests: Recent Labs    01/12/22 0756  TSH 1.866   Anemia Panel: No results for input(s): "VITAMINB12", "FOLATE", "FERRITIN", "TIBC", "IRON", "RETICCTPCT" in the last 72 hours. Urine analysis: No results found for: "COLORURINE", "APPEARANCEUR", "LABSPEC", "PHURINE", "GLUCOSEU", "HGBUR", "BILIRUBINUR", "KETONESUR", "PROTEINUR", "UROBILINOGEN", "NITRITE", "LEUKOCYTESUR"  Radiological Exams on Admission: DG Chest Portable 1 View  Result Date: 01/12/2022 CLINICAL DATA:  Atrial fibrillation with RVR and some chest heaviness with shortness of breath EXAM: PORTABLE CHEST - 1 VIEW COMPARISON:  12/04/2021 FINDINGS: Cardiomediastinal silhouette and pulmonary vasculature are within normal limits. Patchy left midlung airspace opacities are new since prior examination and suspicious for pneumonia. IMPRESSION: 1. Patchy left midlung airspace opacities are new since prior examination and are suspicious for pneumonia. 2. Incompletely healed subacute to chronic fractures at the posterolateral segment of the left third through sixth ribs. Electronically Signed   By: Acquanetta Belling M.D.   On: 01/12/2022 08:25    EKG: Independently reviewed.  A-fib with RVR  Assessment/Plan Active Problems:   Atrial fibrillation (HCC)   A-fib (HCC)  (please populate well all problems here in Problem List. (For example, if patient is on BP meds at home and you resume or decide to  hold them, it is a problem that needs to be her. Same for CAD, COPD, HLD and so on)  A-fib with RVR -On Cardizem drip, continue amiodarone, continue Eliquis -Echo was done recently, will not repeat this time -Amlodipine as patient on Cardizem  Question of left lung infiltrates -No cough, and  patient does have a recent left-sided pneumothorax and chest tube, suspect this is some documentable scarring, check procalcitonin, hold off antibiotics.  HTN, uncontrolled -On Cardizem drip, hold off amlodipine  Gout -Stable, continue allopurinol  Anxiety/depression -Continue SSRI  DVT prophylaxis: Eliquis Code Status: Full code Family Communication: Daughter at bedside Disposition Plan: Expect less than 2 midnight hospital stay Consults called: None Admission status: Telemetry observation   Emeline General MD [facility] Pager [phone]  01/12/2022, 9:47 AM

## 2022-01-13 ENCOUNTER — Encounter
Admission: EM | Disposition: A | Discharge status: Home / Self Care | Payer: Self-pay | Source: Home / Self Care | Attending: Emergency Medicine

## 2022-01-13 ENCOUNTER — Telehealth: Payer: Self-pay | Admitting: Nurse Practitioner

## 2022-01-13 ENCOUNTER — Encounter: Payer: Self-pay | Admitting: Registered Nurse

## 2022-01-13 DIAGNOSIS — I4891 Unspecified atrial fibrillation: Secondary | ICD-10-CM | POA: Diagnosis not present

## 2022-01-13 DIAGNOSIS — I480 Paroxysmal atrial fibrillation: Secondary | ICD-10-CM | POA: Diagnosis not present

## 2022-01-13 DIAGNOSIS — I5032 Chronic diastolic (congestive) heart failure: Secondary | ICD-10-CM | POA: Diagnosis not present

## 2022-01-13 DIAGNOSIS — I4819 Other persistent atrial fibrillation: Secondary | ICD-10-CM | POA: Diagnosis not present

## 2022-01-13 LAB — BASIC METABOLIC PANEL
Anion gap: 9 (ref 5–15)
BUN: 16 mg/dL (ref 8–23)
CO2: 30 mmol/L (ref 22–32)
Calcium: 9.6 mg/dL (ref 8.9–10.3)
Chloride: 102 mmol/L (ref 98–111)
Creatinine, Ser: 0.87 mg/dL (ref 0.44–1.00)
GFR, Estimated: >60 mL/min (ref >60)
Glucose, Bld: 102 mg/dL — ABNORMAL HIGH (ref 70–99)
Potassium: 3.6 mmol/L (ref 3.5–5.1)
Sodium: 141 mmol/L (ref 135–145)

## 2022-01-13 SURGERY — CARDIOVERSION
Anesthesia: General

## 2022-01-13 MED ORDER — AMIODARONE HCL 200 MG PO TABS
200.0000 mg | ORAL_TABLET | Freq: Every day | ORAL | Status: DC
Start: 2022-01-13 — End: 2022-01-13
  Administered 2022-01-13: 200 mg via ORAL
  Filled 2022-01-13: qty 1

## 2022-01-13 MED ORDER — DIPHENHYDRAMINE HCL 50 MG/ML IJ SOLN
25.0000 mg | Freq: Once | INTRAMUSCULAR | Status: AC | PRN
Start: 2022-01-13 — End: 2022-01-13
  Administered 2022-01-13: 25 mg via INTRAVENOUS
  Filled 2022-01-13: qty 1

## 2022-01-13 NOTE — Plan of Care (Signed)
  Problem: Education: Goal: Knowledge of General Education information will improve Description Including pain rating scale, medication(s)/side effects and non-pharmacologic comfort measures Outcome: Progressing   Problem: Health Behavior/Discharge Planning: Goal: Ability to manage health-related needs will improve Outcome: Progressing   

## 2022-01-13 NOTE — TOC Initial Note (Signed)
Transition of Care [facility]) - Initial/Assessment Note    Patient Details  Name: [name] MRN: [id number] Date of Birth: 05-21-1941  Transition of Care [facility]) CM/SW Contact:    Chapman Fitch, RN Phone Number: 01/13/2022, 11:59 AM  Clinical Narrative:                     Transition of Care [facility]) Screening Note   Patient Details  Name: [name] Date of Birth: 09-18-1940   Transition of Care [facility]) CM/SW Contact:    Chapman Fitch, RN Phone Number: 01/13/2022, 11:59 AM    Transition of Care Department [facility]) has reviewed patient and no TOC needs have been identified at this time. We will continue to monitor patient advancement through interdisciplinary progression rounds. If new patient transition needs arise, please place a TOC consult.  Heart failure navigator notified of consult      Patient Goals and CMS Choice        Expected Discharge Plan and Services           Expected Discharge Date: 01/13/22                                    Prior Living Arrangements/Services                       Activities of Daily Living Home Assistive Devices/Equipment: Dan Humphreys (specify type) ADL Screening (condition at time of admission) Patient's cognitive ability adequate to safely complete daily activities?: Yes Is the patient deaf or have difficulty hearing?: No Does the patient have difficulty seeing, even when wearing glasses/contacts?: No Does the patient have difficulty concentrating, remembering, or making decisions?: No Patient able to express need for assistance with ADLs?: Yes Does the patient have difficulty dressing or bathing?: No Independently performs ADLs?: Yes (appropriate for developmental age) Does the patient have difficulty walking or climbing stairs?: No Weakness of Legs: None Weakness of Arms/Hands: None  Permission Sought/Granted                  Emotional Assessment              Admission  diagnosis:  A-fib (HCC) [I48.91] Atrial fibrillation with RVR (HCC) [I48.91] Patient Active Problem List   Diagnosis Date Noted   Atrial fibrillation with RVR (HCC) 01/12/2022   Multiple rib fractures 11/15/2021   Pneumothorax 11/15/2021   Chronic atrial fibrillation with RVR (HCC) 11/05/2021   Depression with anxiety 11/05/2021   Chronic diastolic CHF (congestive heart failure) (HCC) 11/05/2021   Chronic kidney disease, stage 3a (HCC) 11/05/2021   Boil of lower extremity-left ankle 11/05/2021   Alcohol use 11/05/2021   Hypomagnesemia 11/05/2021   Ankle wound, left, subsequent encounter 08/19/2021   Hyperparathyroidism (HCC) 02/17/2021   Slow heart beat 09/22/2020   CHF (congestive heart failure) (HCC) 09/19/2020   Arrhythmia 09/19/2020   VT (ventricular tachycardia) (HCC) 09/19/2020   Ventricular tachycardia (HCC) 09/19/2020   Acute CHF (congestive heart failure) (HCC) 09/18/2020   Troponin level elevated 09/18/2020   Itching 09/05/2020   Coagulation defect (HCC) 08/12/2020   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   Chronic neck pain 04/15/2020   Chronic kidney disease, stage 3, mod decreased GFR (HCC) 04/05/2020   DOE (dyspnea on exertion) 02/09/2020   Anxiety 01/23/2020   Gout 05/18/2019   Nodule of finger, bilateral 03/22/2019   Clavi  03/09/2019   Chronic bilateral low back pain without sciatica 12/31/2017   Atrial fibrillation (HCC) 09/10/2017   Depression, major, single episode, mild (HCC) 09/10/2017   Chronic venous insufficiency 09/09/2017   Lymphedema 09/09/2017   Dry eyes 07/06/2017   Pain and swelling of left ankle 03/29/2017   Fall 05/01/2016   Leg weakness, bilateral 12/30/2015   Anemia of chronic disease 05/19/2013   Psoriatic arthritis (HCC) 02/03/2013   Psoriasis    Osteoarthritis, multiple sites    History of breast cancer 10/28/2011   GERD (gastroesophageal reflux disease)    HTN (hypertension)    Osteopenia    Carotid  stenosis 05/27/2011   Occlusion and stenosis of carotid artery without mention of cerebral infarction 05/27/2011   PCP:  Glori Luis, MD Pharmacy:   [facility] [phone] [facility] - [address] [place] [address] Phone: [phone] Fax: [phone]     Social Determinants of Health (SDOH) Interventions    Readmission Risk Interventions     No data to display

## 2022-01-13 NOTE — Discharge Summary (Signed)
Physician Discharge Summary   [name]  female DOB: [birth year]  MEQ:[id number]  PCP: Dana Haven, MD  Admit date: 01/12/2022 Discharge date: 01/13/2022  Admitted From: home Disposition:  home Family updated at bedside prior to discharge. CODE STATUS: Full code   Hospital Course:  For full details, please see H&P, progress notes, consult notes and ancillary notes.  Briefly,  [name] is a 81 y.o. female with medical history significant of PAF on Eliquis, HFpEF, gout, breast cancer, HTN, came with palpitations and shortness of breath, and her Apple Watch showed that she is in A-fib.  Paroxysmal A-fib with RVR --Pt was started on Cardizem drip and amiodarone gtt, and quickly converted and became rate controlled.  Pt's symptoms resolved. --cardiology consulted.   --pt was continued on home amiodarone and Eliquis. -Defer standing AV nodal blocking agent given borderline low heart rates and mild first-degree AV block. --outpatient f/u with cardio Dr. Caryl Harris.   PNA ruled out --CXR showed Patchy left midlung airspace opacities.  No signs or symptoms of PNA. --patient does have a recent left-sided pneumothorax and chest tube, suspect this is some scarring.   HTN --cont amlodpine  Gout -Stable, continue allopurinol   Anxiety/depression -Continue Remeron   Discharge Diagnoses:  Active Problems:   Atrial fibrillation (HCC)   Atrial fibrillation with RVR (HCC)   30 Day Unplanned Readmission Risk Score    Flowsheet Row ED to Hosp-Admission (Discharged) from 11/15/2021 in [place] [facility]  30 Day Unplanned Readmission Risk Score (%) 21.52 Filed at 11/20/2021 1200       This score is the patient's risk of an unplanned readmission within 30 days of being discharged (0 -100%). The score is based on dignosis, age, lab data, medications, orders, and past utilization.   Low:  0-14.9   Medium: 15-21.9   High: 22-29.9   Extreme: 30 and  above         Discharge Instructions:  Allergies as of 01/13/2022   No Known Allergies      Medication List     TAKE these medications    acetaminophen 325 MG tablet Commonly known as: TYLENOL Take 2 tablets (650 mg total) by mouth every 4 (four) hours as needed for headache or mild pain.   allopurinol 100 MG tablet Commonly known as: ZYLOPRIM TAKE 1.5 TABLETS ('150MG'$  TOTAL) BY MOUTH DAILY   amiodarone 200 MG tablet Commonly known as: PACERONE TAKE 1 TABLET BY MOUTH EVERY DAY   amLODipine 5 MG tablet Commonly known as: NORVASC Take 1 tablet (5 mg total) by mouth daily.   apixaban 2.5 MG Tabs tablet Commonly known as: ELIQUIS Take 1 tablet (2.5 mg total) by mouth 2 (two) times daily.   cholecalciferol 25 MCG (1000 UNIT) tablet Commonly known as: VITAMIN D Take 2 tablets (2,000 Units total) by mouth daily.   furosemide 40 MG tablet Commonly known as: LASIX Take 0.5 tablet (20 mg) by mouth once daily as needed for swelling   LORazepam 0.5 MG tablet Commonly known as: ATIVAN TAKE 1 TABLET BY MOUTH TWICE A DAY AS NEEDED FOR ANXIETY   mirtazapine 15 MG tablet Commonly known as: REMERON TAKE 1 TABLET BY MOUTH EVERYDAY AT BEDTIME   multivitamin capsule Take 1 capsule by mouth daily.   PRESERVISION AREDS 2+MULTI VIT PO Take 1 capsule by mouth in the morning and at bedtime.         Follow-up Information     Dana Sprang, MD  Follow up in 3 week(s).   Specialty: Cardiology Contact information: [address] [place] [id number] [phone]                 No Known Allergies   The results of significant diagnostics from this hospitalization (including imaging, microbiology, ancillary and laboratory) are listed below for reference.   Consultations:   Procedures/Studies: DG Chest Portable 1 View  Result Date: 01/12/2022 CLINICAL DATA:  Atrial fibrillation with RVR and some chest heaviness with shortness of  breath EXAM: PORTABLE CHEST - 1 VIEW COMPARISON:  12/04/2021 FINDINGS: Cardiomediastinal silhouette and pulmonary vasculature are within normal limits. Patchy left midlung airspace opacities are new since prior examination and suspicious for pneumonia. IMPRESSION: 1. Patchy left midlung airspace opacities are new since prior examination and are suspicious for pneumonia. 2. Incompletely healed subacute to chronic fractures at the posterolateral segment of the left third through sixth ribs. Electronically Signed   By: [name] M.D.   On: 01/12/2022 08:25   DG Bone Density  Result Date: 01/08/2022 EXAM: DUAL X-RAY ABSORPTIOMETRY (DXA) FOR BONE MINERAL DENSITY IMPRESSION: Your patient [name] completed a BMD test on 01/08/2022 using the [employer] (software version: 14.10) manufactured by [employer]. The following summarizes the results of our evaluation. Technologist::MTB PATIENT BIOGRAPHICAL: Name: [name] Patient ID: [id number] Birth Date: 23-Mar-1941 Height: 66.0 in. Gender: Female Exam Date: 01/08/2022 Weight: 114.0 lbs. Indications: Height Loss, right hip replacement, Low Body Weight, Postmenopausal, Osteoarthritis, Caucasian, Osteopenia, History of Fracture (Adult), History of Breast Cancer Fractures: Ankle, Pelvis, Ribs Treatments: Vitamin D, Calcium DENSITOMETRY RESULTS: Site         Region     Measured Date Measured Age WHO Classification Young Adult T-score BMD         %Change vs. Previous Significant Change (*) Left Femur Neck 01/08/2020 82.8 Osteoporosis -3.1 0.607 g/cm2 -4.1% - Left Femur Neck 10/10/2019 80.5 Osteoporosis -2.9 0.633 g/cm2 - - Left Femur Total 01/08/2022 82.8 Osteoporosis -2.6 0.676 g/cm2 -4.1% Yes Left Femur Total 10/10/2019 80.5 Osteopenia -2.4 0.705 g/cm2 - - Left Forearm Radius 33% 01/08/2022 82.8 Osteoporosis -2.8 0.627 g/cm2 -3.8% - Left Forearm Radius 33% 10/10/2019 80.5 Osteoporosis -2.6 0.652 g/cm2 - - ASSESSMENT: The BMD measured  at Femur Neck is 0.607 g/cm2 with a T-score of -3.1. This patient is considered osteoporotic according to [facility]) criteria. The scan quality is good. Lumbar spine was not utilized due to advanced degenerative changes. RIGHT Femur was excluded due to surgical hardware. Compared with prior study, there has been significant decrease in the total hip. World Pharmacologist [facility]) criteria for post-menopausal, Caucasian Women: Normal:                   T-score at or above -1 SD Osteopenia/low bone mass: T-score between -1 and -2.5 SD Osteoporosis:             T-score at or below -2.5 SD RECOMMENDATIONS: 1. All patients should optimize calcium and vitamin D intake. 2. Consider FDA-approved medical therapies in postmenopausal women and men aged 15 years and older, based on the following: a. A hip or vertebral(clinical or morphometric) fracture b. T-score < -2.5 at the femoral neck or spine after appropriate evaluation to exclude secondary causes c. Low bone mass (T-score between -1.0 and -2.5 at the femoral neck or spine) and a 10-year probability of a hip fracture > 3% or a 10-year probability of a major  osteoporosis-related fracture > 20% based on the [place]-adapted WHO algorithm 3. Clinician judgment and/or patient preferences may indicate treatment for people with 10-year fracture probabilities above or below these levels FOLLOW-UP: People with diagnosed cases of osteoporosis or at high risk for fracture should have regular bone mineral density tests. For patients eligible for Medicare, routine testing is allowed once every 2 years. The testing frequency can be increased to one year for patients who have rapidly progressing disease, those who are receiving or discontinuing medical therapy to restore bone mass, or have additional risk factors. I have reviewed this report, and agree with the above findings. [facility] Electronically Signed   By: Franki Cabot M.D.   On: 01/08/2022  08:42   ECHOCARDIOGRAM COMPLETE  Result Date: 12/29/2021    ECHOCARDIOGRAM REPORT   Patient Name:   AIESHA LELAND Date of Exam: 12/29/2021 Medical Rec #:  [id number]          Height:       67.0 in Accession #:    [id number]         Weight:       117.0 lb Date of Birth:  06-Jul-1941          BSA:          1.610 m Patient Age:    19 years           BP:           110/90 mmHg Patient Gender: F                  HR:           86 bpm. Exam Location:  [place] Procedure: 2D Echo, Cardiac Doppler and Color Doppler Indications:    I50.20* Unspecified systolic (congestive) heart failure; I48.91*                 Unspeicified atrial fibrillation  History:        Patient has prior history of Echocardiogram examinations, most                 recent 09/19/2020. CHF, Arrythmias:Atrial Fibrillation and                 Bradycardia; Risk Factors:Former Smoker, Hypertension and                 Dyslipidemia. H/o pleurla effusion.  Sonographer:    Pilar Jarvis RDMS, RVT, RDCS Referring Phys: [id number] [name]  Sonographer Comments: Very limited acosutic windows IMPRESSIONS  1. Left ventricular ejection fraction, by estimation, is 55 to 60%. The left ventricle has normal function. The left ventricle has no regional wall motion abnormalities. Left ventricular diastolic parameters were normal.  2. Right ventricular systolic function is normal. The right ventricular size is normal.  3. The mitral valve is normal in structure. Trivial mitral valve regurgitation.  4. The aortic valve was not well visualized. Aortic valve regurgitation is not visualized.  5. The inferior vena cava is normal in size with greater than 50% respiratory variability, suggesting right atrial pressure of 3 mmHg. FINDINGS  Left Ventricle: Left ventricular ejection fraction, by estimation, is 55 to 60%. The left ventricle has normal function. The left ventricle has no regional wall motion abnormalities. The left ventricular internal cavity size was normal in size.  There is  no left ventricular hypertrophy. Left ventricular diastolic parameters were normal. Right Ventricle: The right ventricular size is normal. No increase in right ventricular wall thickness. Right  ventricular systolic function is normal. Left Atrium: Left atrial size was normal in size. Right Atrium: Right atrial size was normal in size. Pericardium: There is no evidence of pericardial effusion. Mitral Valve: The mitral valve is normal in structure. Trivial mitral valve regurgitation. Tricuspid Valve: The tricuspid valve is normal in structure. Tricuspid valve regurgitation is trivial. Aortic Valve: The aortic valve was not well visualized. Aortic valve regurgitation is not visualized. Aortic valve mean gradient measures 2.0 mmHg. Aortic valve peak gradient measures 3.4 mmHg. Aortic valve area, by VTI measures 3.50 cm. Pulmonic Valve: The pulmonic valve was not well visualized. Pulmonic valve regurgitation is not visualized. Aorta: The aortic root and ascending aorta are structurally normal, with no evidence of dilitation. Venous: The inferior vena cava is normal in size with greater than 50% respiratory variability, suggesting right atrial pressure of 3 mmHg. IAS/Shunts: No atrial level shunt detected by color flow Doppler.  LEFT VENTRICLE PLAX 2D LVIDd:         4.80 cm     Diastology LVIDs:         3.20 cm     LV e' medial:    6.42 cm/s LV PW:         0.90 cm     LV E/e' medial:  12.2 LV IVS:        1.00 cm     LV e' lateral:   9.68 cm/s LVOT diam:     2.10 cm     LV E/e' lateral: 8.1 LV SV:         67 LV SV Index:   42 LVOT Area:     3.46 cm  LV Volumes (MOD) LV vol d, MOD A2C: 74.4 ml LV vol d, MOD A4C: 64.9 ml LV vol s, MOD A2C: 38.1 ml LV vol s, MOD A4C: 28.7 ml LV SV MOD A2C:     36.3 ml LV SV MOD A4C:     64.9 ml LV SV MOD BP:      34.8 ml RIGHT VENTRICLE             IVC RV S prime:     11.10 cm/s  IVC diam: 1.50 cm RVOT diam:      2.40 cm TAPSE (M-mode): 2.3 cm LEFT ATRIUM             Index         RIGHT ATRIUM           Index LA diam:        3.70 cm 2.30 cm/m   RA Area:     14.70 cm LA Vol (A2C):   51.5 ml 31.98 ml/m  RA Volume:   38.10 ml  23.66 ml/m LA Vol (A4C):   36.6 ml 22.73 ml/m LA Biplane Vol: 47.1 ml 29.25 ml/m  AORTIC VALVE                    PULMONIC VALVE AV Area (Vmax):    3.28 cm     PV Vmax:       0.63 m/s AV Area (Vmean):   3.04 cm     PV Peak grad:  1.6 mmHg AV Area (VTI):     3.50 cm AV Vmax:           91.60 cm/s AV Vmean:          62.700 cm/s AV VTI:            0.191 m AV Peak  Grad:      3.4 mmHg AV Mean Grad:      2.0 mmHg LVOT Vmax:         86.80 cm/s LVOT Vmean:        55.100 cm/s LVOT VTI:          0.193 m LVOT/AV VTI ratio: 1.01  AORTA Ao Root diam: 3.10 cm Ao Asc diam:  3.20 cm Ao Arch diam: 2.1 cm MITRAL VALVE               TRICUSPID VALVE MV Area (PHT): 3.36 cm    TR Peak grad:   29.4 mmHg MV Decel Time: 226 msec    TR Vmax:        271.00 cm/s MV E velocity: 78.40 cm/s MV A velocity: 79.30 cm/s  SHUNTS MV E/A ratio:  0.99        Systemic VTI:  0.19 m                            Systemic Diam: 2.10 cm                            Pulmonic Diam: 2.40 cm Kate Sable MD Electronically signed by Kate Sable MD Signature Date/Time: 12/29/2021/11:08:21 AM    Final       Labs: BNP (last 3 results) Recent Labs    11/05/21 1232  BNP 545.6*   Basic Metabolic Panel: Recent Labs  Lab 01/12/22 0756 01/13/22 0549  NA 140 141  K 4.9 3.6  CL 107 102  CO2 24 30  GLUCOSE 85 102*  BUN 17 16  CREATININE 0.93 0.87  CALCIUM 8.5* 9.6   Liver Function Tests: Recent Labs  Lab 01/12/22 0756  AST 41  ALT 19  ALKPHOS 72  BILITOT 0.9  PROT 7.0  ALBUMIN 3.5   No results for input(s): "LIPASE", "AMYLASE" in the last 168 hours. No results for input(s): "AMMONIA" in the last 168 hours. CBC: Recent Labs  Lab 01/12/22 0756  WBC 5.9  NEUTROABS 3.2  HGB 11.9*  HCT 36.9  MCV 104.8*  PLT 291   Cardiac Enzymes: No results for input(s): "CKTOTAL", "CKMB",  "CKMBINDEX", "TROPONINI" in the last 168 hours. BNP: Invalid input(s): "POCBNP" CBG: No results for input(s): "GLUCAP" in the last 168 hours. D-Dimer No results for input(s): "DDIMER" in the last 72 hours. Hgb A1c No results for input(s): "HGBA1C" in the last 72 hours. Lipid Profile No results for input(s): "CHOL", "HDL", "LDLCALC", "TRIG", "CHOLHDL", "LDLDIRECT" in the last 72 hours. Thyroid function studies Recent Labs    01/12/22 1030  TSH 1.259   Anemia work up No results for input(s): "VITAMINB12", "FOLATE", "FERRITIN", "TIBC", "IRON", "RETICCTPCT" in the last 72 hours. Urinalysis No results found for: "COLORURINE", "APPEARANCEUR", "LABSPEC", "PHURINE", "GLUCOSEU", "HGBUR", "BILIRUBINUR", "KETONESUR", "PROTEINUR", "UROBILINOGEN", "NITRITE", "LEUKOCYTESUR" Sepsis Labs Recent Labs  Lab 01/12/22 0756  WBC 5.9   Microbiology No results found for this or any previous visit (from the past 240 hour(s)).   Total time spend on discharging this patient, including the last patient exam, discussing the hospital stay, instructions for ongoing care as it relates to all pertinent caregivers, as well as preparing the medical discharge records, prescriptions, and/or referrals as applicable, is 35 minutes.    Enzo Bi, MD  [facility] 01/13/2022, 11:33 AM

## 2022-01-14 ENCOUNTER — Ambulatory Visit: Payer: Medicare Other | Admitting: Internal Medicine

## 2022-01-14 NOTE — Telephone Encounter (Signed)
Late entry: Reviewed the message below with Dr. Caryl Comes on 01/13/22.  Per Dr. Caryl Comes, the patient was recently in the hospital for recurrent atrial fibrillation- question if the atrial fibrillation was contributing to her SOB.  I did discuss amiodarone with Dr. Caryl Comes- the patient had no pre PFT's. She has been on the drug over a year.   Per Dr. Caryl Comes- ok to order: 1) PFT's 2) High Resolution Chest CT  I was notified by Gabriel Cirri (scheduling) this morning that Dr. Caryl Comes had a cancellation in his schedule tomorrow. The patient was contacted and she will come in tomorrow for a post hospital visit. We can discuss PFT's & High Resolution Chest CT at that time.

## 2022-01-15 ENCOUNTER — Ambulatory Visit (INDEPENDENT_AMBULATORY_CARE_PROVIDER_SITE_OTHER): Payer: Medicare Other | Admitting: Internal Medicine

## 2022-01-15 ENCOUNTER — Encounter: Payer: Self-pay | Admitting: Internal Medicine

## 2022-01-15 ENCOUNTER — Ambulatory Visit: Payer: Medicare Other | Admitting: Internal Medicine

## 2022-01-15 VITALS — BP 138/90 | HR 74 | Ht 65.0 in | Wt 115.0 lb

## 2022-01-15 DIAGNOSIS — I951 Orthostatic hypotension: Secondary | ICD-10-CM | POA: Insufficient documentation

## 2022-01-15 DIAGNOSIS — R001 Bradycardia, unspecified: Secondary | ICD-10-CM

## 2022-01-15 DIAGNOSIS — I5032 Chronic diastolic (congestive) heart failure: Secondary | ICD-10-CM | POA: Diagnosis not present

## 2022-01-15 DIAGNOSIS — Z79899 Other long term (current) drug therapy: Secondary | ICD-10-CM

## 2022-01-15 DIAGNOSIS — I4819 Other persistent atrial fibrillation: Secondary | ICD-10-CM | POA: Diagnosis not present

## 2022-01-15 DIAGNOSIS — R0600 Dyspnea, unspecified: Secondary | ICD-10-CM | POA: Diagnosis not present

## 2022-01-15 NOTE — Progress Notes (Signed)
Patient ID: [name], female   DOB: Oct 12, 1940, 81 y.o.   MRN: [id number]     Patient Care Team: [name], MD as PCP - General (Family Medicine) [name], MD as PCP - Cardiology (Cardiology) [name], MD (Family Medicine)   HPI   [name] is a 81 y.o. female seen in follow-up for recurrent atrial fibrillation, treated with  flecainide >> amiodarone and anticoagulation with dabigitran   Has had hospitalizations related to HFpEF with rapid AF. Recurrent cardioversions. Most recently 3/22 at which time the flecainide was replaced with amiodarone   9/21 ER following a fall while Inebriated ( ER BAL >200) Seen and Discussed with DR TT--drinking at least 2 ounces of vodka at night and some wine.  There have been concerns from the children about drinking too much She have reduced her alcohol to intermittently a glass of wine before her cocktail watching the news  2 interval hospitalizations, 1 for atrial fibrillation with a rapid rate, converted spontaneously, and subsequently for rib fractures  consequential to a mechanical fall.  (Parenthetically on neither occasion was alcohol level measured although she says her alcohol intake has been much much less)   Hospitalized again for atrial fibrillation with a rapid rate, converted spontaneously.  She is aware of it by her Apple watch, but without any really particular symptoms.  Continues with dyspnea.  Not improved with diuretics.   Thromboembolic risk factors ( age  -2, HTN-1, Vasc disease -1, Gender-1) for a CHADSVASc Score of 5      husband past away in 2019   DATE TEST EF   2/19 Echo   65 % LAE (48/2.8/46)  9/21 Echo  60-65%   3/22 Echo 50-55% LA normal   6/23 Echo  60-65%     Date Cr K TSH LFT  Hgb  2/19 0.8 3.9   12.5  5/19  1.12 3.8     6/20 0.97 4.4   12.4  12/20 0.9  (CE) 2/21 4.2    10.6  9/21 1.11<<1.7 5.0   10.3<<8.5  6/22 1.08 4.6 2.65 (7/22) 19 (7/22) 11.1  5/23 1.03 3.8  3.64 (4/23) 77 10.0   Past Surgical History:  Procedure Laterality Date   ABDOMINAL HYSTERECTOMY     ANKLE FRACTURE SURGERY  4/08   left---hardware still in place   BREAST BIOPSY Left 2001   breast ca   BREAST EXCISIONAL BIOPSY Left yrs ago   benign   BREAST LUMPECTOMY Left 2001   f/u radiation   CARDIOVERSION N/A 10/04/2017   Procedure: CARDIOVERSION;  Surgeon: Wellington Hampshire, MD;  Location: [facility];  Service: Cardiovascular;  Laterality: N/A;   CARDIOVERSION N/A 12/16/2017   Procedure: CARDIOVERSION;  Surgeon: [name], MD;  Location: [facility];  Service: Cardiovascular;  Laterality: N/A;   CARDIOVERSION N/A 06/24/2020   Procedure: CARDIOVERSION;  Surgeon: Wellington Hampshire, MD;  Location: [facility];  Service: Cardiovascular;  Laterality: N/A;   CAROTID ENDARTERECTOMY Left 10/23/2004   FRACTURE SURGERY     OOPHORECTOMY     SHOULDER SURGERY  6/07   left   TONSILLECTOMY AND ADENOIDECTOMY     TOTAL HIP ARTHROPLASTY  2004   right    Current Outpatient Medications  Medication Sig Dispense Refill   acetaminophen (TYLENOL) 325 MG tablet Take 2 tablets (650 mg total) by mouth every 4 (four) hours as needed for headache or mild pain.     allopurinol (ZYLOPRIM) 100 MG tablet  TAKE 1.5 TABLETS ('150MG'$  TOTAL) BY MOUTH DAILY 135 tablet 1   amiodarone (PACERONE) 200 MG tablet TAKE 1 TABLET BY MOUTH EVERY DAY 90 tablet 2   amLODipine (NORVASC) 5 MG tablet Take 1 tablet (5 mg total) by mouth daily. 90 tablet 2   apixaban (ELIQUIS) 2.5 MG TABS tablet Take 1 tablet (2.5 mg total) by mouth 2 (two) times daily. 60 tablet 6   cholecalciferol (VITAMIN D) 25 MCG (1000 UNIT) tablet Take 2 tablets (2,000 Units total) by mouth daily.     furosemide (LASIX) 40 MG tablet Take 0.5 tablet (20 mg) by mouth once daily as needed for swelling 45 tablet 2   LORazepam (ATIVAN) 0.5 MG tablet TAKE 1 TABLET BY MOUTH TWICE A DAY AS NEEDED FOR ANXIETY 60 tablet 0   mirtazapine (REMERON) 15 MG tablet TAKE 1 TABLET  BY MOUTH EVERYDAY AT BEDTIME 90 tablet 3   Multiple Vitamin (MULTIVITAMIN) capsule Take 1 capsule by mouth daily.     Multiple Vitamins-Minerals (PRESERVISION AREDS 2+MULTI VIT PO) Take 1 capsule by mouth in the morning and at bedtime.     No current facility-administered medications for this visit.    No Known Allergies  Review of Systems negative except from HPI and PMH  Physical Exam: BP (!) 150/90 (BP Location: Left Arm, Patient Position: Sitting, Cuff Size: Normal)   Pulse 74   Ht '5\' 5"'$  (1.651 m)   Wt 115 lb (52.2 kg)   SpO2 96%   BMI 19.14 kg/m  Well developed and nourished in no acute distress HENT normal Neck supple with JVP-  flat  Clear Regular rate and rhythm, no murmurs or gallops Abd-soft with active BS No Clubbing cyanosis edema Skin-warm and dry A & Oriented  Grossly normal sensory and motor function  ECG sinus at 74 Intervals 20/09/45  Assessment and  Plan  Atrial fibrillation-persistent with a rapid rate  Sinus brady  Dyspnea on exertion  High Risk Medication Surveillance amiodarone  Anemia -- elevated MCV, iron labs normal  Renal insufficiency grade 3  Hypertension   Orthostatic hypotension  Peripheral vascular disease carotid artery occlusion  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  While relatively asymptomatic, least on some occasions, it has also triggered heart failure and others.  It is also very psychologically disruptive.  We discussed ongoing therapy with amiodarone versus consideration of catheter ablation.  Not withstanding her age, in light of her normal left atrial size, I reviewed with Dr. Daune Perch and he will be glad to sit down and talk with her about this.  We reviewed some of the randomized and nonrandomized data related to outcomes.  Given the fact that her atrial fibrillation was largely asymptomatic this time as before but was noted on her watch, I suggested that she  hold off for about 24 hours and see if it does not spontaneously revert prior to coming to the hospital.  In the presence of symptoms she should come straightaway   Blood pressure is elevated.

## 2022-01-15 NOTE — Patient Instructions (Addendum)
Medication Instructions:  - Your physician recommends that you continue on your current medications as directed. Please refer to the Current Medication list given to you today.  *If you need a refill on your cardiac medications before your next appointment, please call your pharmacy*   Lab Work: - none ordered  If you have labs (blood work) drawn today and your tests are completely normal, you will receive your results only by: [place] (if you have MyChart) OR A paper copy in the mail If you have any lab test that is abnormal or we need to change your treatment, we will call you to review the results.   Testing/Procedures:  1) High Resolution Chest CT: Non-Cardiac CT scanning, (CAT scanning), is a noninvasive, special x-ray that produces cross-sectional images of the body using x-rays and a computer. CT scans help physicians diagnose and treat medical conditions. For some CT exams, a contrast material is used to enhance visibility in the area of the body being studied. CT scans provide greater clarity and reveal more details than regular x-ray exams.    2) You have been referred to : Dr. Lars Mage- for consideration of Atrial Fibrillation Ablation  Wednesday 02/11/22 @ 11:00 am   Follow-Up: At [facility], you and your health needs are our priority.  As part of our continuing mission to provide you with exceptional heart care, we have created designated Provider Care Teams.  These Care Teams include your primary Cardiologist (physician) and Advanced Practice Providers (APPs -  Physician Assistants and Nurse Practitioners) who all work together to provide you with the care you need, when you need it.  We recommend signing up for the patient portal called "MyChart".  Sign up information is provided on this After Visit Summary.  MyChart is used to connect with patients for Virtual Visits (Telemedicine).  Patients are able to view lab/test results, encounter notes, upcoming  appointments, etc.  Non-urgent messages can be sent to your provider as well.   To learn more about what you can do with MyChart, go to [email or web address].    Your next appointment:   As scheduled with Dr. Caryl Comes   The format for your next appointment:   In Person  Provider:   Virl Axe, MD    Other Instructions N/a  Important Information About Sugar

## 2022-01-22 ENCOUNTER — Ambulatory Visit
Admission: RE | Admit: 2022-01-22 | Discharge: 2022-01-22 | Disposition: A | Discharge status: Home / Self Care | Payer: Medicare Other | Source: Ambulatory Visit | Attending: Internal Medicine | Admitting: Internal Medicine

## 2022-01-22 DIAGNOSIS — Z79899 Other long term (current) drug therapy: Secondary | ICD-10-CM | POA: Insufficient documentation

## 2022-01-22 DIAGNOSIS — R0600 Dyspnea, unspecified: Secondary | ICD-10-CM | POA: Diagnosis not present

## 2022-01-22 DIAGNOSIS — S2242XA Multiple fractures of ribs, left side, initial encounter for closed fracture: Secondary | ICD-10-CM | POA: Diagnosis not present

## 2022-02-04 ENCOUNTER — Ambulatory Visit: Payer: Medicare Other | Admitting: Family

## 2022-02-09 ENCOUNTER — Telehealth: Payer: Self-pay | Admitting: Internal Medicine

## 2022-02-09 DIAGNOSIS — R0600 Dyspnea, unspecified: Secondary | ICD-10-CM

## 2022-02-09 DIAGNOSIS — I2510 Atherosclerotic heart disease of native coronary artery without angina pectoris: Secondary | ICD-10-CM

## 2022-02-09 NOTE — Telephone Encounter (Signed)
Dana Sprang, MD  01/29/2022  7:30 PM EDT     Please Inform Patient that -CT shows calcification of the coronary arteries which   is abnormal -- but the lungs look great   Thanks

## 2022-02-09 NOTE — Telephone Encounter (Signed)
Discussed the findings of coronary artery calcifications with Dr. Caryl Comes- per Dr. Caryl Comes, with the patient's ongoing complaints of dyspnea, the recommendation would be to proceed with a Cardiac CTA.   Attempted to call the patient x 2 attempts. 1st attempt- the phone was picked up, but no answer. 2nd attempt- the phone was picked up and then hung up.   Will attempt to reach the patient again tomorrow.

## 2022-02-10 NOTE — Telephone Encounter (Signed)
Attempted to call the patient. No answer- I left a message of results and advised that I would like to discuss further cardiac testing with her.    I asked that she call back here to the office or she can ask for me tomorrow while she is seeing Dr. Quentin Ore.

## 2022-02-10 NOTE — Progress Notes (Unsigned)
Electrophysiology Office Note:    Date:  02/11/2022   ID:  [name], DOB 04-May-1941, MRN [id number]  PCP:  [name], MD  [facility] Cardiologist:  Virl Axe, MD  [facility] Electrophysiologist:  Vickie Epley, MD   Referring MD: [name], MD   Chief Complaint: Atrial fibrillation  History of Present Illness:    [name] is a 81 y.o. female who presents for an evaluation of atrial fibrillation at the request of Dr. Caryl Comes. Their medical history includes chronic diastolic heart failure, atrial fibrillation and alcohol use.  She has previously tried flecainide and amiodarone for her atrial fibrillation.  She is on dabigatran for anticoagulation.  Given the association of her atrial fibrillation to decompensated diastolic heart failure, she was referred to discuss catheter ablation.  Today she is with her friend in clinic.  She tells me that she was very ill when she was in atrial fibrillation and had decompensated diastolic heart failure.  She is tolerating Eliquis without any bleeding issues.  She is tolerating amiodarone 200 mg by mouth once daily.    Past Medical History:  Diagnosis Date   (HFpEF) heart failure with preserved ejection fraction ([name])    a. 08/2017 Echo: EF 55-60%, no rwma, mild MR, mildly dil LA, nl RV fxn; b. 03/2020 Echo: EF 60-65%, no rwma, Gr2 DD. RVSP 44.37mHg. Mod dil LA. Mild MR; c. 09/2020 Echo: EF 50-55%, no rwma, mild LVH, mod red RV fxn, mildly dily RA.   Acute CHF (congestive heart failure) ([name] 09/18/2020   Alcohol abuse 04/05/2020   Breast cancer ([name] 2001   left breast   [name]   left breast ca   Carotid arterial disease ([name]    a. 10/2004 s/p L CEA; b. 12/2015 Carotid U/S: RICA 1-39%; b. LICA patent CEA site.   Closed right hip fracture ([place] 09/20/2019   GERD (gastroesophageal reflux disease)    Hyperlipidemia    Hypertension    Osteoarthritis, multiple sites    Osteopenia    Persistent  atrial fibrillation ([name]    a. Dx 08/2017; b. CHA2DS2VASc = 6-->Pradaxa; c. 09/2017 Successful DCCV (second shock - 200J); d. 10/2017 Recurrent Afib-->flecainide started 11/2017; e. 06/2020 s/p DCCV (200J x 1); f. 06/2020 Recurrent AF->Flec inc 75bid; g. 09/2020 WCT->flec d/c'd->amio started.   Personal history of radiation therapy 2001   left breast ca   Psoriasis    Wide-complex tachycardia    a. 09/2020 in setting of presumed Flecainide toxicity.  Flecainide d/c'd.    Past Surgical History:  Procedure Laterality Date   ABDOMINAL HYSTERECTOMY     ANKLE FRACTURE SURGERY  4/08   left---hardware still in place   BREAST BIOPSY Left 2001   breast ca   BREAST EXCISIONAL BIOPSY Left yrs ago   benign   BREAST LUMPECTOMY Left 2001   f/u radiation   CARDIOVERSION N/A 10/04/2017   Procedure: CARDIOVERSION;  Surgeon: [name] MD;  [facility];  Service: Cardiovascular;  Laterality: N/A;   CARDIOVERSION N/A 12/16/2017   Procedure: CARDIOVERSION;  Surgeon: [name] MD;  Location: [facility];  Service: Cardiovascular;  Laterality: N/A;   CARDIOVERSION N/A 06/24/2020   Procedure: CARDIOVERSION;  Surgeon: [name] MD;  Location: [facility];  Service: Cardiovascular;  Laterality: N/A;   CAROTID ENDARTERECTOMY Left 10/23/2004   FRACTURE SURGERY     OOPHORECTOMY     SHOULDER SURGERY  6/07   left   TONSILLECTOMY AND  [name] ARTHROPLASTY  2004   right    Current Medications: Current Meds  Medication Sig   acetaminophen (TYLENOL) 325 MG tablet Take 2 tablets (650 mg total) by mouth every 4 (four) hours as needed for headache or mild pain.   allopurinol (ZYLOPRIM) 100 MG tablet TAKE 1.5 TABLETS ('150MG'$  TOTAL) BY MOUTH DAILY   amiodarone (PACERONE) 200 MG tablet TAKE 1 TABLET BY MOUTH EVERY DAY   amLODipine (NORVASC) 5 MG tablet Take 1 tablet (5 mg total) by mouth daily.   apixaban (ELIQUIS) 2.5 MG TABS tablet Take 1 tablet (2.5 mg total) by mouth 2 (two)  times daily.   cholecalciferol (VITAMIN D) 25 MCG (1000 UNIT) tablet Take 2 tablets (2,000 Units total) by mouth daily.   furosemide (LASIX) 40 MG tablet Take 0.5 tablet (20 mg) by mouth once daily as needed for swelling   LORazepam (ATIVAN) 0.5 MG tablet TAKE 1 TABLET BY MOUTH TWICE A DAY AS NEEDED FOR ANXIETY   mirtazapine (REMERON) 15 MG tablet TAKE 1 TABLET BY MOUTH EVERYDAY AT BEDTIME   Multiple Vitamin (MULTIVITAMIN) capsule Take 1 capsule by mouth daily.   Multiple Vitamins-Minerals (PRESERVISION AREDS 2+MULTI VIT PO) Take 1 capsule by mouth in the morning and at bedtime.     Allergies:   Patient has no known allergies.   Social History   Socioeconomic History   Marital status: Widowed    Spouse name: Not on file   Number of children: Not on file   Years of education: Not on file   Highest education level: Not on file  Occupational History   Occupation: Marketing    Comment: Retired  Tobacco Use   Smoking status: Former    Packs/day: 1.00    Years: 40.00    Total pack years: 40.00    Types: Cigarettes    Quit date: 07/21/1995    Years since quitting: 26.5   Smokeless tobacco: Never  Vaping Use   Vaping Use: Never used  Substance and Sexual Activity   Alcohol use: Yes    Alcohol/week: 14.0 standard drinks of alcohol    Types: 7 Glasses of wine, 7 Standard drinks or equivalent per week    Comment: 1 glass of wine and 1 drink of liquor daily   Drug use: No   Sexual activity: Never  Other Topics Concern   Not on file  Social History Narrative   1 natural child   3 adopted children   Artist---still teaches water colors   Husband has Alzheimers      Has living will   Daughter Amy is health care POA   DNR    No tube feeds if cognitively unaware   Social Determinants of Health   Financial Resource Strain: Low Risk  (02/10/2021)   Overall Financial Resource Strain (CARDIA)    Difficulty of Paying Living Expenses: Not hard at all  Food Insecurity: No Food  Insecurity (02/10/2021)   Hunger Vital Sign    Worried About Running Out of Food in the Last Year: Never true    Ran Out of Food in the Last Year: Never true  Transportation Needs: No Transportation Needs (02/10/2021)   PRAPARE - Hydrologist (Medical): No    Lack of Transportation (Non-Medical): No  Physical Activity: Unknown (02/10/2021)   Exercise Vital Sign    Days of Exercise per Week: 0 days    Minutes of Exercise per Session: Not on file  Stress: No Stress Concern Present (02/10/2021)   [place]    Feeling of Stress : Only a little  Social Connections: Unknown (02/10/2021)   Social Connection and Isolation Panel [NHANES]    Frequency of Communication with Friends and Family: More than three times a week    Frequency of Social Gatherings with Friends and Family: More than three times a week    Attends Religious Services: Not on Advertising copywriter or Organizations: Not on file    Attends Archivist Meetings: Not on file    Marital Status: Not on file     Family History: The patient's family history includes Hypertension in her brother; Leukemia in her father; Pneumonia in her mother. There is no history of Heart disease, Diabetes, or Breast cancer.  ROS:   Please see the history of present illness.    All other systems reviewed and are negative.  EKGs/Labs/Other Studies Reviewed:    The following studies were reviewed today:  December 29, 2021 echo LV function normal, 55% RV function normal Trivial MR  January 15, 2022 EKG personally reviewed shows sinus rhythm with a borderline prolonged first-degree AV delay and a QTc of about 500 ms     Recent Labs: 11/05/2021: B Natriuretic Peptide 237.2 11/19/2021: Magnesium 1.6 01/12/2022: ALT 19; Hemoglobin 11.9; Platelets 291; TSH 1.259 01/13/2022: BUN 16; Creatinine, Ser 0.87; Potassium 3.6; Sodium 141  Recent Lipid  Panel    Component Value Date/Time   CHOL 216 (H) 01/11/2019 0952   TRIG 116.0 01/11/2019 0952   HDL 88.90 01/11/2019 0952   CHOLHDL 2 01/11/2019 0952   VLDL 23.2 01/11/2019 0952   LDLCALC 104 (H) 01/11/2019 0952   LDLDIRECT 151.4 02/01/2012 1113    Physical Exam:    VS:  BP (!) 144/92   Pulse 84   Ht '5\' 5"'$  (1.651 m)   Wt 116 lb (52.6 kg)   BMI 19.30 kg/m     Wt Readings from Last 3 Encounters:  02/11/22 116 lb (52.6 kg)  01/15/22 115 lb (52.2 kg)  01/13/22 118 lb 6.2 oz (53.7 kg)     GEN: Well nourished, well developed in no acute distress.  Appears younger than stated age 27: Normal NECK: No JVD; No carotid bruits LYMPHATICS: No lymphadenopathy CARDIAC: RRR, no murmurs, rubs, gallops RESPIRATORY:  Clear to auscultation without rales, wheezing or rhonchi  ABDOMEN: Soft, non-tender, non-distended MUSCULOSKELETAL:  No edema; No deformity  SKIN: Warm and dry NEUROLOGIC:  Alert and oriented x 3 PSYCHIATRIC:  Normal affect       ASSESSMENT:    1. Chronic heart failure with preserved ejection fraction (HCC)   2. Persistent atrial fibrillation ([place])   3. High risk medication use    PLAN:    In order of problems listed above:  #Persistent atrial fibrillation On amiodarone.  We discussed using catheter ablation to help maintain normal rhythm.  We discussed long-term antiarrhythmic use.  She has continued to have breakthrough episodes despite treatment with amiodarone.  She would like to proceed with catheter ablation.  I discussed the details of the procedure including the risks and recovery and potential need for repeat ablation and she wishes to proceed.  Discussed treatment options today for his AF including antiarrhythmic drug therapy and ablation. Discussed risks, recovery and likelihood of success. Discussed potential need for repeat ablation procedures and antiarrhythmic drugs after an initial ablation. They wish to proceed with  scheduling.  Risk, benefits,  and alternatives to EP study and radiofrequency ablation for afib were also discussed in detail today. These risks include but are not limited to stroke, bleeding, vascular damage, tamponade, perforation, damage to the esophagus, lungs, and other structures, pulmonary vein stenosis, worsening renal function, and death. The patient understands these risk and wishes to proceed.  We will therefore proceed with catheter ablation at the next available time.  Carto, ICE, anesthesia are requested for the procedure.  Will also obtain CT PV protocol prior to the procedure to exclude LAA thrombus and further evaluate atrial anatomy.   #Chronic diastolic heart failure In the setting of A-fib.  For now, continue Lasix.  Rhythm control as above.  #Amiodarone monitoring June blood work (CMP and TSH) looks good.  We will continue amiodarone for now.   Total time spent with patient today 65 minutes. This includes reviewing records, evaluating the patient and coordinating care.  Medication Adjustments/Labs and Tests Ordered: Current medicines are reviewed at length with the patient today.  Concerns regarding medicines are outlined above.  No orders of the defined types were placed in this encounter.  No orders of the defined types were placed in this encounter.    Signed, Hilton Cork. Quentin Ore, MD, [facility], [facility] 02/11/2022 12:58 PM    Electrophysiology [facility]

## 2022-02-11 ENCOUNTER — Encounter: Payer: Self-pay | Admitting: Cardiology

## 2022-02-11 ENCOUNTER — Ambulatory Visit (INDEPENDENT_AMBULATORY_CARE_PROVIDER_SITE_OTHER): Payer: Medicare Other | Admitting: Cardiology

## 2022-02-11 VITALS — BP 144/92 | HR 84 | Ht 65.0 in | Wt 116.0 lb

## 2022-02-11 DIAGNOSIS — Z79899 Other long term (current) drug therapy: Secondary | ICD-10-CM | POA: Diagnosis not present

## 2022-02-11 DIAGNOSIS — I5032 Chronic diastolic (congestive) heart failure: Secondary | ICD-10-CM | POA: Diagnosis not present

## 2022-02-11 DIAGNOSIS — I4819 Other persistent atrial fibrillation: Secondary | ICD-10-CM | POA: Diagnosis not present

## 2022-02-11 NOTE — Patient Instructions (Addendum)
Medications: Your physician recommends that you continue on your current medications as directed. Please refer to the Current Medication list given to you today. *If you need a refill on your cardiac medications before your next appointment, please call your pharmacy*  Lab Work: None. If you have labs (blood work) drawn today and your tests are completely normal, you will receive your results only by: Jud (if you have MyChart) OR A paper copy in the mail If you have any lab test that is abnormal or we need to change your treatment, we will call you to review the results.  Testing/Procedures: Your physician has requested that you have cardiac CT. Cardiac computed tomography (CT) is a painless test that uses an x-ray machine to take clear, detailed pictures of your heart. For further information please visit [email or web address]. Please follow instruction sheet as given.  Your physician has recommended that you have an ablation. Catheter ablation is a medical procedure used to treat some cardiac arrhythmias (irregular heartbeats). During catheter ablation, a long, thin, flexible tube is put into a blood vessel in your groin (upper thigh), or neck. This tube is called an ablation catheter. It is then guided to your heart through the blood vessel. Radio frequency waves destroy small areas of heart tissue where abnormal heartbeats may cause an arrhythmia to start. Please see the instruction sheet given to you today.   Follow-Up: At [facility], you and your health needs are our priority.  As part of our continuing mission to provide you with exceptional heart care, we have created designated Provider Care Teams.  These Care Teams include your primary Cardiologist (physician) and Advanced Practice Providers (APPs -  Physician Assistants and Nurse Practitioners) who all work together to provide you with the care you need, when you need it.  Your physician wants you to follow-up in:  Ablation date picked is November 17 and pre op lab day is Oct 27. You will go to the [facility] Entrance at [facility] 1st desk on the right to check in Lab hours: 7:30 am- 5:30 pm (walk in basis) no fasting. Then come to the office to get your CT and ablation instructions after your labs.   We recommend signing up for the patient portal called "MyChart".  Sign up information is provided on this After Visit Summary.  MyChart is used to connect with patients for Virtual Visits (Telemedicine).  Patients are able to view lab/test results, encounter notes, upcoming appointments, etc.  Non-urgent messages can be sent to your provider as well.   To learn more about what you can do with MyChart, go to [email or web address].    Any Other Special Instructions Will Be Listed Below (If Applicable).  Cardiac Ablation Cardiac ablation is a procedure to destroy (ablate) some heart tissue that is sending bad signals. These bad signals cause problems in heart rhythm. The heart has many areas that make these signals. If there are problems in these areas, they can make the heart beat in a way that is not normal. Destroying some tissues can help make the heart rhythm normal. Tell your doctor about: Any allergies you have. All medicines you are taking. These include vitamins, herbs, eye drops, creams, and over-the-counter medicines. Any problems you or family members have had with medicines that make you fall asleep (anesthetics). Any blood disorders you have. Any surgeries you have had. Any medical conditions you have, such as kidney failure. Whether you are pregnant or may be pregnant. What are the risks? This  is a safe procedure. But problems may occur, including: Infection. Bruising and bleeding. Bleeding into the chest. Stroke or blood clots. Damage to nearby areas of your body. Allergies to medicines or dyes. The need for a pacemaker if the normal system is damaged. Failure of the procedure to treat  the problem. What happens before the procedure? Medicines Ask your doctor about: Changing or stopping your normal medicines. This is important. Taking aspirin and ibuprofen. Do not take these medicines unless your doctor tells you to take them. Taking other medicines, vitamins, herbs, and supplements. General instructions Follow instructions from your doctor about what you cannot eat or drink. Plan to have someone take you home from the hospital or clinic. If you will be going home right after the procedure, plan to have someone with you for 24 hours. Ask your doctor what steps will be taken to prevent infection. What happens during the procedure?  An IV tube will be put into one of your veins. You will be given a medicine to help you relax. The skin on your neck or groin will be numbed. A cut (incision) will be made in your neck or groin. A needle will be put through your cut and into a large vein. A tube (catheter) will be put into the needle. The tube will be moved to your heart. Dye may be put through the tube. This helps your doctor see your heart. Small devices (electrodes) on the tube will send out signals. A type of energy will be used to destroy some heart tissue. The tube will be taken out. Pressure will be held on your cut. This helps stop bleeding. A bandage will be put over your cut. The exact procedure may vary among doctors and hospitals. What happens after the procedure? You will be watched until you leave the hospital or clinic. This includes checking your heart rate, breathing rate, oxygen, and blood pressure. Your cut will be watched for bleeding. You will need to lie still for a few hours. Do not drive for 24 hours or as long as your doctor tells you. Summary Cardiac ablation is a procedure to destroy some heart tissue. This is done to treat heart rhythm problems. Tell your doctor about any medical conditions you may have. Tell him or her about all medicines you  are taking to treat them. This is a safe procedure. But problems may occur. These include infection, bruising, bleeding, and damage to nearby areas of your body. Follow what your doctor tells you about food and drink. You may also be told to change or stop some of your medicines. After the procedure, do not drive for 24 hours or as long as your doctor tells you. This information is not intended to replace advice given to you by your health care provider. Make sure you discuss any questions you have with your health care provider. Document Revised: 06/08/2019 Document Reviewed: 06/08/2019 Elsevier Patient Education  [place].

## 2022-02-12 NOTE — Telephone Encounter (Signed)
I spoke with the patient. She has been notified of her High Resolution chest CT results and Dr. Olin Pia recommendations for a Cardiac CT to further evaluate her coronary arteries.   She is aware that I have also discussed this with Dr. Quentin Ore in light of the CT she would need prior to her a-fib ablation in November. Per Dr. Quentin Ore, ok to schedule her cardiac CT now and he can get what information he needs off that exam.  The patient voices understanding and is agreeable.  She is aware I will arrange for her testing and call her back with a date/ time.   She was appreciative of the call as she did not receive my previous voice mail message.

## 2022-02-13 ENCOUNTER — Ambulatory Visit (INDEPENDENT_AMBULATORY_CARE_PROVIDER_SITE_OTHER): Payer: Medicare Other

## 2022-02-13 VITALS — Ht 65.0 in | Wt 113.0 lb

## 2022-02-13 DIAGNOSIS — Z0000 Encounter for general adult medical examination without abnormal findings: Secondary | ICD-10-CM

## 2022-02-13 NOTE — Progress Notes (Signed)
Subjective:   [name] is a 81 y.o. female who presents for Medicare Annual (Subsequent) preventive examination.  Review of Systems    No ROS.  Medicare Wellness Virtual Visit.  Visual/audio telehealth visit, UTA vital signs.   See social history for additional risk factors.   Cardiac Risk Factors include: advanced age (>33mn, >>48women)     Objective:    Today's Vitals   02/13/22 0906  Weight: 113 lb (51.3 kg)  Height: '5\' 5"'$  (1.651 m)   Body mass index is 18.8 kg/m.     02/13/2022    9:08 AM 01/12/2022    7:55 AM 11/15/2021    4:00 PM 11/15/2021    9:01 AM 11/05/2021   12:31 PM 02/10/2021    8:35 AM 09/21/2020    1:00 PM  Advanced Directives  Does Patient Have a Medical Advance Directive? Yes Yes Yes Yes No Yes Yes  Type of AParamedicof AAutaugavilleLiving will HRandolphLiving will HSevernLiving will HCowanLiving will  HFreedomLiving will HDenairLiving will  Does patient want to make changes to medical advance directive? No - Patient declined  No - Patient declined   No - Patient declined No - Patient declined  Copy of [place]in Chart? No - copy requested  No - copy requested   No - copy requested   Would patient like information on creating a medical advance directive?     No - Patient declined      Current Medications (verified) Outpatient Encounter Medications as of 02/13/2022  Medication Sig   acetaminophen (TYLENOL) 325 MG tablet Take 2 tablets (650 mg total) by mouth every 4 (four) hours as needed for headache or mild pain.   allopurinol (ZYLOPRIM) 100 MG tablet TAKE 1.5 TABLETS ('150MG'$  TOTAL) BY MOUTH DAILY   amiodarone (PACERONE) 200 MG tablet TAKE 1 TABLET BY MOUTH EVERY DAY   amLODipine (NORVASC) 5 MG tablet Take 1 tablet (5 mg total) by mouth daily.   apixaban (ELIQUIS) 2.5 MG TABS tablet Take 1 tablet (2.5  mg total) by mouth 2 (two) times daily.   cholecalciferol (VITAMIN D) 25 MCG (1000 UNIT) tablet Take 2 tablets (2,000 Units total) by mouth daily.   furosemide (LASIX) 40 MG tablet Take 0.5 tablet (20 mg) by mouth once daily as needed for swelling   LORazepam (ATIVAN) 0.5 MG tablet TAKE 1 TABLET BY MOUTH TWICE A DAY AS NEEDED FOR ANXIETY   mirtazapine (REMERON) 15 MG tablet TAKE 1 TABLET BY MOUTH EVERYDAY AT BEDTIME   Multiple Vitamin (MULTIVITAMIN) capsule Take 1 capsule by mouth daily.   Multiple Vitamins-Minerals (PRESERVISION AREDS 2+MULTI VIT PO) Take 1 capsule by mouth in the morning and at bedtime.   No facility-administered encounter medications on file as of 02/13/2022.    Allergies (verified) Patient has no known allergies.   History: Past Medical History:  Diagnosis Date   (HFpEF) heart failure with preserved ejection fraction ([place]    a. 08/2017 Echo: EF 55-60%, no rwma, mild MR, mildly dil LA, nl RV fxn; b. 03/2020 Echo: EF 60-65%, no rwma, Gr2 DD. RVSP 44.737mg. Mod dil LA. Mild MR; c. 09/2020 Echo: EF 50-55%, no rwma, mild LVH, mod red RV fxn, mildly dily RA.   Acute CHF (congestive heart failure) ([place]/08/2020   Alcohol abuse 04/05/2020   [place] ([place]   left breast   Cancer ([place]  2001   left breast ca   Carotid arterial disease ([place])    a. 10/2004 s/p L CEA; b. 12/2015 Carotid U/S: RICA 1-39%; b. LICA patent CEA site.   Closed right hip fracture ([place]) 09/20/2019   GERD (gastroesophageal reflux disease)    Hyperlipidemia    Hypertension    Osteoarthritis, multiple sites    Osteopenia    Persistent atrial fibrillation ([place])    a. Dx 08/2017; b. CHA2DS2VASc = 6-->Pradaxa; c. 09/2017 Successful DCCV (second shock - 200J); d. 10/2017 Recurrent Afib-->flecainide started 11/2017; e. 06/2020 s/p DCCV (200J x 1); f. 06/2020 Recurrent AF->Flec inc 75bid; g. 09/2020 WCT->flec d/c'd->amio started.   Personal history of radiation therapy 2001   left breast ca   Psoriasis     Wide-complex tachycardia    a. 09/2020 in setting of presumed Flecainide toxicity.  Flecainide d/c'd.   Past Surgical History:  Procedure Laterality Date   ABDOMINAL HYSTERECTOMY     ANKLE FRACTURE SURGERY  4/08   left---hardware still in place   BREAST BIOPSY Left 2001   breast ca   BREAST EXCISIONAL BIOPSY Left yrs ago   benign   BREAST LUMPECTOMY Left 2001   f/u radiation   CARDIOVERSION N/A 10/04/2017   Procedure: CARDIOVERSION;  Surgeon: Wellington Hampshire, MD;  Location: [facility];  Service: Cardiovascular;  Laterality: N/A;   CARDIOVERSION N/A 12/16/2017   Procedure: CARDIOVERSION;  Surgeon: [name], MD;  Location: [facility];  Service: Cardiovascular;  Laterality: N/A;   CARDIOVERSION N/A 06/24/2020   Procedure: CARDIOVERSION;  Surgeon: Wellington Hampshire, MD;  Location: [facility];  Service: Cardiovascular;  Laterality: N/A;   CAROTID ENDARTERECTOMY Left 10/23/2004   FRACTURE SURGERY     OOPHORECTOMY     SHOULDER SURGERY  6/07   left   TONSILLECTOMY AND ADENOIDECTOMY     TOTAL HIP ARTHROPLASTY  2004   right   Family History  Problem Relation Age of Onset   Hypertension Brother    Pneumonia Mother    Leukemia Father    Heart disease Neg Hx    Diabetes Neg Hx    Breast cancer Neg Hx    Social History   Socioeconomic History   Marital status: Widowed    Spouse name: Not on file   Number of children: Not on file   Years of education: Not on file   Highest education level: Not on file  Occupational History   Occupation: Marketing    Comment: Retired  Tobacco Use   Smoking status: Former    Packs/day: 1.00    Years: 40.00    Total pack years: 40.00    Types: Cigarettes    Quit date: 07/21/1995    Years since quitting: 26.5   Smokeless tobacco: Never  Vaping Use   Vaping Use: Never used  Substance and Sexual Activity   Alcohol use: Yes    Alcohol/week: 14.0 standard drinks of alcohol    Types: 7 Glasses of wine, 7 Standard drinks or equivalent per week     Comment: 1 glass of wine and 1 drink of liquor daily   Drug use: No   Sexual activity: Never  Other Topics Concern   Not on file  Social History Narrative   1 natural child   3 adopted children   Artist---still teaches water colors   Husband has Alzheimers      Has living will   Daughter Amy is health care POA   DNR    No  tube feeds if cognitively unaware   Social Determinants of Health   Financial Resource Strain: Low Risk  (02/13/2022)   Overall Financial Resource Strain (CARDIA)    Difficulty of Paying Living Expenses: Not hard at all  Food Insecurity: No Food Insecurity (02/13/2022)   Hunger Vital Sign    Worried About Running Out of Food in the Last Year: Never true    Ran Out of Food in the Last Year: Never true  Transportation Needs: No Transportation Needs (02/13/2022)   PRAPARE - Hydrologist (Medical): No    Lack of Transportation (Non-Medical): No  Physical Activity: Unknown (02/10/2021)   Exercise Vital Sign    Days of Exercise per Week: 0 days    Minutes of Exercise per Session: Not on file  Stress: No Stress Concern Present (02/13/2022)   [place]    Feeling of Stress : Not at all  Social Connections: Unknown (02/13/2022)   Social Connection and Isolation Panel [NHANES]    Frequency of Communication with Friends and Family: More than three times a week    Frequency of Social Gatherings with Friends and Family: More than three times a week    Attends Religious Services: Not on Advertising copywriter or Organizations: Not on file    Attends Archivist Meetings: Not on file    Marital Status: Not on file    Tobacco Counseling Counseling given: Not Answered   Clinical Intake:  Pre-visit preparation completed: Yes        Diabetes: No  How often do you need to have someone help you when you read instructions, pamphlets, or other written  materials from your doctor or pharmacy?: 1 - Never    Interpreter Needed?: No      Activities of Daily Living    02/13/2022    9:09 AM 01/12/2022    4:30 PM  In your present state of health, do you have any difficulty performing the following activities:  Hearing? 0 0  Vision? 0 0  Difficulty concentrating or making decisions? 0 0  Walking or climbing stairs? 1 0  Comment Rollator in use when ambulating   Dressing or bathing? 0 0  Doing errands, shopping? 0 0  Preparing Food and eating ? N   Using the Toilet? N   In the past six months, have you accidently leaked urine? Y   Comment Managed with daily pad/liner   Do you have problems with loss of bowel control? N   Managing your Medications? N   Managing your Finances? N   Housekeeping or managing your Housekeeping? N     Patient Care Team: [name], MD as PCP - General (Family Medicine) [name], MD as PCP - Cardiology (Cardiology) Vickie Epley, MD as PCP - Electrophysiology (Cardiology) [name], MD (Family Medicine)  Indicate any recent Medical Services you may have received from other than [facility] providers in the past year (date may be approximate).     Assessment:   This is a routine wellness examination for [place].  Virtual Visit via Telephone Note  I connected with  [name] on 02/13/22 at  9:00 AM EDT by telephone and verified that I am speaking with the correct person using two identifiers.  Location: Patient: home Provider: office Persons participating in the virtual visit: patient/Nurse Health Advisor   I discussed the limitations of performing  an evaluation and management service by telehealth. We continued and completed visit with audio only. Some vital signs may be absent or patient reported.   Hearing/Vision screen Hearing Screening - Comments:: Patient is able to hear conversational tones without difficulty. No issues reported. Vision Screening -  Comments:: Followed by [facility] Wears reader lenses  Cataract extraction, bilateral They have seen their ophthalmologist in the last 12 months.   Dietary issues and exercise activities discussed: Current Exercise Habits: Home exercise routine, Time (Minutes): 45, Frequency (Times/Week): 4, Weekly Exercise (Minutes/Week): 180, Intensity: Mild Regular diet Good water intake   Goals Addressed               This Visit's Progress     Patient Stated     Maintain healthy lifestyle (pt-stated)        Stay active. Healthy diet.       Depression Screen    02/13/2022    9:42 AM 10/31/2021    9:45 AM 08/26/2021   11:21 AM 08/19/2021    9:39 AM 07/02/2021    9:10 AM 02/10/2021    8:32 AM 09/05/2020   10:51 AM  PHQ 2/9 Scores  PHQ - 2 Score 0 0  0 0 0 0  Exception Documentation   Patient refusal        Fall Risk    02/13/2022    9:43 AM 10/31/2021    9:45 AM 08/19/2021    9:39 AM 07/02/2021    9:10 AM 02/10/2021    8:36 AM  Fall Risk   Falls in the past year? 1 0 0 0 1  Comment None since last reported.      Number falls in past yr:  0 0 0   Injury with Fall?  0 0 0 0  Risk for fall due to : History of fall(s) No Fall Risks No Fall Risks No Fall Risks   Follow up Falls evaluation completed Falls evaluation completed Falls evaluation completed Falls evaluation completed     [place]: Home free of loose throw rugs in walkways, pet beds, electrical cords, etc? Yes  Adequate lighting in your home to reduce risk of falls? Yes   ASSISTIVE DEVICES UTILIZED TO PREVENT FALLS: Life alert/smart watch? Yes  Use of a cane, walker or w/c? Yes  Grab bars in the bathroom? Yes  Shower chair or bench in shower? Yes  Elevated toilet seat or a handicapped toilet? Yes   TIMED UP AND GO: Was the test performed? No .   Cognitive Function: Patient is alert and oriented x3.      09/12/2015    8:30 AM  MMSE - Mini Mental State Exam  Orientation  to time 5  Orientation to Place 5  Registration 3  Attention/ Calculation 5  Recall 3  Language- name 2 objects 2  Language- repeat 1  Language- follow 3 step command 3  Language- read & follow direction 1  Write a sentence 1  Copy design 1  Total score 30        02/13/2022    9:44 AM 02/10/2021    8:52 AM 02/08/2020    8:44 AM 02/07/2019    8:45 AM 09/11/2016    9:06 AM  6CIT Screen  What Year? 0 points 0 points 0 points 0 points 0 points  What month? 0 points 0 points 0 points 0 points   What time? 0 points 0 points 0 points 0 points  0 points  Count back from 20 0 points 0 points  0 points 0 points  Months in reverse 0 points 0 points  0 points 0 points  Repeat phrase 0 points 0 points  0 points 0 points  Total Score 0 points 0 points  0 points     Immunizations Immunization History  Administered Date(s) Administered   Fluad Quad(high Dose 65+) 04/15/2020   Influenza Split 05/20/2011, 05/10/2013   Influenza, High Dose Seasonal PF 05/10/2015, 04/07/2019   Influenza-Unspecified 05/11/2014, 04/19/2016, 04/24/2017, 04/24/2021   PFIZER(Purple Top)SARS-COV-2 Vaccination 07/31/2019, 08/21/2019, 12/17/2020   Pfizer Covid-19 Vaccine Bivalent Booster 81yr & up 04/24/2021   Pneumococcal Conjugate-13 12/13/2013   Pneumococcal Polysaccharide-23 06/19/2009   Tdap 03/08/2011    TDAP status: Due, Education has been provided regarding the importance of this vaccine. Advised may receive this vaccine at local pharmacy or Health Dept. Aware to provide a copy of the vaccination record if obtained from local pharmacy or Health Dept. Verbalized acceptance and understanding.  Screening Tests Health Maintenance  Topic Date Due   TETANUS/TDAP  02/26/2022 (Originally 03/07/2021)   INFLUENZA VACCINE  02/17/2022   Pneumonia Vaccine 81 Years old  Completed   DEXA SCAN  Completed   COVID-19 Vaccine  Completed   HPV VACCINES  Aged Out   MAMMOGRAM  Discontinued   Zoster Vaccines- Shingrix   Discontinued   Health Maintenance There are no preventive care reminders to display for this patient.  Lung Cancer Screening: (Low Dose CT Chest recommended if Age 83-80years, 30 pack-year currently smoking OR have quit w/in 15years.) does not qualify.   Vision Screening: Recommended annual ophthalmology exams for early detection of glaucoma and other disorders of the eye.  Dental Screening: Recommended annual dental exams for proper oral hygiene  Community Resource Referral / Chronic Care Management: CRR required this visit?  No   CCM required this visit?  No      Plan:   Keep all routine maintenance appointments.   I have personally reviewed and noted the following in the patient's chart:   Medical and social history Use of alcohol, tobacco or illicit drugs  Current medications and supplements including opioid prescriptions.  Functional ability and status Nutritional status Physical activity Advanced directives List of other physicians Hospitalizations, surgeries, and ER visits in previous 12 months Vitals Screenings to include cognitive, depression, and falls Referrals and appointments  In addition, I have reviewed and discussed with patient certain preventive protocols, quality metrics, and best practice recommendations. A written personalized care plan for preventive services as well as general preventive health recommendations were provided to patient.     OVarney Biles LPN   [id number]

## 2022-02-13 NOTE — Patient Instructions (Addendum)
  [name] , Thank you for taking time to come for your Medicare Wellness Visit. I appreciate your ongoing commitment to your health goals. Please review the following plan we discussed and let me know if I can assist you in the future.   These are the goals we discussed:  Goals       Patient Stated     Maintain healthy lifestyle (pt-stated)      Stay active. Healthy diet.        This is a list of the screening recommended for you and due dates:  Health Maintenance  Topic Date Due   Tetanus Vaccine  02/26/2022*   Flu Shot  02/17/2022   Pneumonia Vaccine  Completed   DEXA scan (bone density measurement)  Completed   COVID-19 Vaccine  Completed   HPV Vaccine  Aged Out   Mammogram  Discontinued   Zoster (Shingles) Vaccine  Discontinued  *Topic was postponed. The date shown is not the original due date.

## 2022-02-17 ENCOUNTER — Encounter: Payer: Self-pay | Admitting: *Deleted

## 2022-02-17 MED ORDER — METOPROLOL TARTRATE 100 MG PO TABS: ORAL_TABLET | ORAL | 0 refills | Status: DC

## 2022-02-17 NOTE — Addendum Note (Signed)
Addended byAlvis Lemmings C on: 02/17/2022 09:10 AM   Modules accepted: Orders

## 2022-02-17 NOTE — Telephone Encounter (Signed)
Cardiac CT order placed.  Your cardiac CT will be scheduled at:   [facility] [address] [place], [place] [address] [phone]   Please arrive 15 mins early for check-in and test prep.  Please follow these instructions carefully (unless otherwise directed):   On the Night Before the Test: Be sure to Drink plenty of water. Do not consume any caffeinated/decaffeinated beverages or chocolate 12 hours prior to your test. Do not take any antihistamines 12 hours prior to your test.   On the Day of the Test: Drink plenty of water until 1 hour prior to the test. Do not eat any food 4 hours prior to the test. You may take your regular medications prior to the test.  Take metoprolol (Lopressor) 100 mg two hours prior to test. HOLD Furosemide  morning of the test. FEMALES- please wear underwire-free bra if available, avoid dresses & tight clothing         After the Test: Drink plenty of water. After receiving IV contrast, you may experience a mild flushed feeling. This is normal. On occasion, you may experience a mild rash up to 24 hours after the test. This is not dangerous. If this occurs, you can take Benadryl 25 mg and increase your fluid intake. If you experience trouble breathing, this can be serious. If it is severe call 911 IMMEDIATELY. If it is mild, please call our office.  Some insurance companies will need an authorization prior to the service being performed.   For non-scheduling related questions, please contact the cardiac imaging nurse navigator should you have any questions/concerns: Marchia Bond, Cardiac Imaging Nurse Navigator Gordy Clement, Cardiac Imaging Nurse Navigator [facility] Direct Office Dial: [phone]   For scheduling needs, including cancellations and rescheduling, please call [place], [phone].

## 2022-02-17 NOTE — Telephone Encounter (Signed)
I called the patient and notified her that her Cardiac CT can be done on Thursday 02/26/22 at 9:30 am (arrive at 9:15 am) at [facility].   She is aware I am mailing a copy of her detailed instructions today. We have reviewed these by phone as well. She is advised I will be sending a 1 x dose of metoprolol tartrate 100 mg that she will need to take 2 hours prior to her scan.  I have asked her to please call with any questions or concerns once she receives her instructions in the mail.  The patient voices understanding and is agreeable.

## 2022-02-18 ENCOUNTER — Telehealth: Payer: Self-pay | Admitting: Family Medicine

## 2022-02-18 DIAGNOSIS — F419 Anxiety disorder, unspecified: Secondary | ICD-10-CM

## 2022-02-24 ENCOUNTER — Other Ambulatory Visit: Payer: Self-pay | Admitting: Internal Medicine

## 2022-02-24 NOTE — Telephone Encounter (Signed)
Patient states pharmacy has not received her prescription for LORazepam (ATIVAN) 0.5 MG tablet.  I looked in Epic and let patient know that receipt for this medication was received by the [facility] on [address] at 15:37HK on 09/18/7612.

## 2022-02-25 ENCOUNTER — Telehealth (HOSPITAL_COMMUNITY): Payer: Self-pay | Admitting: Emergency Medicine

## 2022-02-25 NOTE — Telephone Encounter (Signed)
Patient had called twice stating she needed a refill on her Ativan, [facility] called [facility] and the pharmacist stated that the medication is hard to get right now , they gave her 3 pills that they had in  stock and it is ordered, he stated  she could wait for the medications or you could write it for something different he stated they have explained this to her a couple of times.  Rollins Wrightson,cma

## 2022-02-25 NOTE — Telephone Encounter (Signed)
Reaching out to patient to offer assistance regarding upcoming cardiac imaging study; pt verbalizes understanding of appt date/time, parking situation and where to check in, pre-test NPO status and medications ordered, and verified current allergies; name and call back number provided for further questions should they arise Dana Bond RN Navigator Cardiac Imaging [facility] [phone] office [phone] cell  Arrival 9[address] CT Daily meds, holding lasix, taking '100mg'$  metoprolol tartrate 2 h pta   Pt states shes talked to her son who has had 3 ablations at [facility] and suggests she needs CT just prior to the ablation. I told her that [facility] was okay with doing the coronary CTA per Dr. Caryl Comes and getting his info from that scan (we will add a delay to look at the LAA) knowing her ablation isnt until November. She would like a phone call to discuss this further.

## 2022-02-25 NOTE — Telephone Encounter (Signed)
I called and spoke with the patient. She states that her son is wanting her to have her ablation at [facility] as that is where his previous ablations were done.  She advised that she is not wanting her procedure at [facility], she does want this with Dr. Quentin Ore.  However, she is concerned that her Cardiac CT is tomorrow and her ablation is not until November. I advised the patient that I discussed with Dr. Quentin Ore after her office visit with him on 02/11/22 that her ablation is not until November, but Dr. Caryl Comes wanted to obtain a Cardiac CT now to r/o any possible CAD in light her of SOB. Per Dr. Quentin Ore, he was ok with her getting her Cardiac CT now and advised me he could get what he needed off the scan for her ablation.  She is aware I will follow up with Dr. Quentin Ore after her Cardiac CT is finalized by Dr. Caryl Comes to make sure there is no other testing she will need prior to her A-fib ablation that is scheduled for 11/17.  The patient voices understanding and is agreeable.  She was very appreciative of the call.

## 2022-02-25 NOTE — Telephone Encounter (Signed)
Noted. She can try calling around to other pharmacies to see if any of them have it in stock or we can send it to another pharmacy to see if they have it.

## 2022-02-26 ENCOUNTER — Ambulatory Visit
Admission: RE | Admit: 2022-02-26 | Discharge: 2022-02-26 | Disposition: A | Discharge status: Home / Self Care | Payer: Medicare Other | Source: Ambulatory Visit | Attending: Internal Medicine | Admitting: Internal Medicine

## 2022-02-26 DIAGNOSIS — I2510 Atherosclerotic heart disease of native coronary artery without angina pectoris: Secondary | ICD-10-CM | POA: Diagnosis not present

## 2022-02-26 DIAGNOSIS — I2584 Coronary atherosclerosis due to calcified coronary lesion: Secondary | ICD-10-CM | POA: Diagnosis not present

## 2022-02-26 DIAGNOSIS — R0600 Dyspnea, unspecified: Secondary | ICD-10-CM | POA: Insufficient documentation

## 2022-02-26 LAB — POCT I-STAT CREATININE: Creatinine, Ser: 1.4 mg/dL — ABNORMAL HIGH (ref 0.44–1.00)

## 2022-02-26 MED ORDER — NITROGLYCERIN 0.4 MG SL SUBL
0.8000 mg | SUBLINGUAL_TABLET | Freq: Once | SUBLINGUAL | Status: AC
  Administered 2022-02-26: 0.8 mg via SUBLINGUAL

## 2022-02-26 MED ORDER — IOHEXOL 350 MG/ML SOLN
75.0000 mL | Freq: Once | INTRAVENOUS | Status: AC | PRN
  Administered 2022-02-26: 75 mL via INTRAVENOUS

## 2022-02-26 NOTE — Telephone Encounter (Signed)
I called the patient and informed her that she needs to call other pharmacies to see who has it in stock and to call and let us kow and we will sent her scritp to that pharmacy.  She understood.  Alyssamae Klinck,cma

## 2022-02-26 NOTE — Progress Notes (Signed)
Patient tolerated procedure well. Ambulate w/o difficulty. Denies light headedness or being dizzy. Sitting in chair drinking water provided. Encouraged to drink extra water today and reasoning explained. Verbalized understanding. All questions answered. ABC intact. No further needs. Discharge from procedure area w/o issues.  Large bottle of water given for patient to take and drink. She is aware of reasoning behind drinking water and her creat results (results explained)

## 2022-02-26 NOTE — Telephone Encounter (Signed)
Patient states she found out that [facility] [place], [place], [place] [address] has plenty of the medication she needs to have refilled.  Patient states their phone number is [phone].  Patient states she would like to tell Fulton Mole, CMA, "Thank you!".

## 2022-02-27 ENCOUNTER — Ambulatory Visit (INDEPENDENT_AMBULATORY_CARE_PROVIDER_SITE_OTHER): Payer: Medicare Other | Admitting: Family Medicine

## 2022-02-27 ENCOUNTER — Encounter: Payer: Self-pay | Admitting: Family Medicine

## 2022-02-27 ENCOUNTER — Ambulatory Visit (INDEPENDENT_AMBULATORY_CARE_PROVIDER_SITE_OTHER): Payer: Medicare Other

## 2022-02-27 ENCOUNTER — Other Ambulatory Visit: Payer: Self-pay | Admitting: Family Medicine

## 2022-02-27 VITALS — BP 120/60 | HR 41 | Temp 97.5°F | Ht 65.0 in | Wt 118.4 lb

## 2022-02-27 DIAGNOSIS — S270XXA Traumatic pneumothorax, initial encounter: Secondary | ICD-10-CM

## 2022-02-27 DIAGNOSIS — M8000XD Age-related osteoporosis with current pathological fracture, unspecified site, subsequent encounter for fracture with routine healing: Secondary | ICD-10-CM

## 2022-02-27 DIAGNOSIS — I509 Heart failure, unspecified: Secondary | ICD-10-CM

## 2022-02-27 DIAGNOSIS — I2510 Atherosclerotic heart disease of native coronary artery without angina pectoris: Secondary | ICD-10-CM

## 2022-02-27 DIAGNOSIS — I480 Paroxysmal atrial fibrillation: Secondary | ICD-10-CM

## 2022-02-27 DIAGNOSIS — R0609 Other forms of dyspnea: Secondary | ICD-10-CM | POA: Diagnosis not present

## 2022-02-27 DIAGNOSIS — I2584 Coronary atherosclerosis due to calcified coronary lesion: Secondary | ICD-10-CM

## 2022-02-27 DIAGNOSIS — J90 Pleural effusion, not elsewhere classified: Secondary | ICD-10-CM | POA: Diagnosis not present

## 2022-02-27 DIAGNOSIS — S91002D Unspecified open wound, left ankle, subsequent encounter: Secondary | ICD-10-CM

## 2022-02-27 LAB — COMPREHENSIVE METABOLIC PANEL
ALT: 62 U/L — ABNORMAL HIGH (ref 0–35)
AST: 83 U/L — ABNORMAL HIGH (ref 0–37)
Albumin: 4.1 g/dL (ref 3.5–5.2)
Alkaline Phosphatase: 90 U/L (ref 39–117)
BUN: 33 mg/dL — ABNORMAL HIGH (ref 6–23)
CO2: 26 mEq/L (ref 19–32)
Calcium: 9.4 mg/dL (ref 8.4–10.5)
Chloride: 97 mEq/L (ref 96–112)
Creatinine, Ser: 1.36 mg/dL — ABNORMAL HIGH (ref 0.40–1.20)
GFR: 36.16 mL/min — ABNORMAL LOW (ref >60.00)
Glucose, Bld: 85 mg/dL (ref 70–99)
Potassium: 3.9 mEq/L (ref 3.5–5.1)
Sodium: 134 mEq/L — ABNORMAL LOW (ref 135–145)
Total Bilirubin: 0.5 mg/dL (ref 0.2–1.2)
Total Protein: 6.9 g/dL (ref 6.0–8.3)

## 2022-02-27 LAB — TSH: TSH: 2.58 u[IU]/mL (ref 0.35–5.50)

## 2022-02-27 LAB — CBC
HCT: 35.7 % — ABNORMAL LOW (ref 36.0–46.0)
Hemoglobin: 11.3 g/dL — ABNORMAL LOW (ref 12.0–15.0)
MCHC: 31.7 g/dL (ref 30.0–36.0)
MCV: 107.2 fl — ABNORMAL HIGH (ref 78.0–100.0)
Platelets: 259 10*3/uL (ref 150.0–400.0)
RBC: 3.33 Mil/uL — ABNORMAL LOW (ref 3.87–5.11)
RDW: 14.5 % (ref 11.5–15.5)
WBC: 5 10*3/uL (ref 4.0–10.5)

## 2022-02-27 LAB — BRAIN NATRIURETIC PEPTIDE: Pro B Natriuretic peptide (BNP): 420 pg/mL — ABNORMAL HIGH (ref 0.0–100.0)

## 2022-02-27 LAB — VITAMIN D 25 HYDROXY (VIT D DEFICIENCY, FRACTURES): VITD: 64.59 ng/mL (ref 30.00–100.00)

## 2022-02-27 MED ORDER — LORAZEPAM 0.5 MG PO TABS: ORAL_TABLET | ORAL | 0 refills | Status: DC

## 2022-02-27 NOTE — Progress Notes (Signed)
[name], MD Phone: [phone]  [name] is a 81 y.o. female who presents today for f/u.  Anxiety: Patient notes this is well-controlled.  She is not taking the Ativan very frequently though notes she has taken it a little more frequently recently at night to help sleep.  She takes Remeron.  No depression or SI.  Atrial fibrillation/decompensated heart failure: Patient had a hospitalization for this in June.  She has since followed up with cardiology and had a cardiac CT and they plan to do an atrial fibrillation ablation in November.  She continues on Eliquis, amiodarone.  She is also taking half a tablet of Lasix daily.  She does note some dyspnea on exertion that has been getting progressively worse over 6 months.  She has had occasional chest heaviness.  She notes no orthopnea.  No PND.  No swelling.   No palpitations.  No bleeding issues.  Patient did take metoprolol yesterday morning around 7:30 AM prior to her CT scan.  History of falls: Patient notes no falls since earlier this year when she broke multiple ribs and had a pneumothorax.  She has done physical therapy for her balance and has been working with Silver sneakers 3 days a week and feels as though that has helped her avoid falls.  She notes her falls in the recent past have not been associated with her drinking alcohol or taking Ativan and occur earlier in the day prior to her taking in either of those things.  Wound: Patient has had a wound on her left medial malleolus.  She notes it has been there quite some time and never completely healed all the way.  He gets painful at night.  She saw orthopedics and she notes they advised her that she would have to live with it.  She notes it has worsened a little bit over the last 7 to 10 days.  She wonders if an antibiotic would be helpful.  Osteoporosis: noted on recent dexa scan.   Social History   Tobacco Use  Smoking Status Former   Packs/day: 1.00   Years: 40.00    Total pack years: 40.00   Types: Cigarettes   Quit date: 07/21/1995   Years since quitting: 26.6  Smokeless Tobacco Never    Current Outpatient Medications on File Prior to Visit  Medication Sig Dispense Refill   acetaminophen (TYLENOL) 325 MG tablet Take 2 tablets (650 mg total) by mouth every 4 (four) hours as needed for headache or mild pain.     allopurinol (ZYLOPRIM) 100 MG tablet TAKE 1.5 TABLETS (150MG TOTAL) BY MOUTH DAILY 135 tablet 1   amiodarone (PACERONE) 200 MG tablet TAKE 1 TABLET BY MOUTH EVERY DAY 90 tablet 2   amLODipine (NORVASC) 5 MG tablet Take 1 tablet (5 mg total) by mouth daily. 90 tablet 2   apixaban (ELIQUIS) 2.5 MG TABS tablet Take 1 tablet (2.5 mg total) by mouth 2 (two) times daily. 60 tablet 6   cholecalciferol (VITAMIN D) 25 MCG (1000 UNIT) tablet Take 2 tablets (2,000 Units total) by mouth daily.     furosemide (LASIX) 40 MG tablet Take 0.5 tablet (20 mg) by mouth once daily as needed for swelling 45 tablet 2   LORazepam (ATIVAN) 0.5 MG tablet TAKE 1 TABLET BY MOUTH TWICE A DAY AS NEEDED FOR ANXIETY 60 tablet 0   mirtazapine (REMERON) 15 MG tablet TAKE 1 TABLET BY MOUTH EVERYDAY AT BEDTIME 90 tablet 3   Multiple Vitamin (MULTIVITAMIN) capsule  Take 1 capsule by mouth daily.     Multiple Vitamins-Minerals (PRESERVISION AREDS 2+MULTI VIT PO) Take 1 capsule by mouth in the morning and at bedtime.     No current facility-administered medications on file prior to visit.     ROS see history of present illness  Objective  Physical Exam Vitals:   02/27/22 1014  BP: 120/60  Pulse: (!) 41  Temp: (!) 97.5 F (36.4 C)  SpO2: 91%    BP Readings from Last 3 Encounters:  02/27/22 120/60  02/26/22 (!) 96/58  02/11/22 (!) 144/92   Wt Readings from Last 3 Encounters:  02/27/22 118 lb 6.4 oz (53.7 kg)  02/13/22 113 lb (51.3 kg)  02/11/22 116 lb (52.6 kg)    Physical Exam Constitutional:      General: She is not in acute distress.    Appearance: She is  not diaphoretic.  Cardiovascular:     Rate and Rhythm: Regular rhythm. Bradycardia present.     Heart sounds: Normal heart sounds.  Pulmonary:     Effort: Pulmonary effort is normal.     Breath sounds: Normal breath sounds.  Skin:    General: Skin is warm and dry.  Neurological:     Mental Status: She is alert.    Slight tenderness around the wound, no induration, the mild redness around the wound appears to be inflammatory, no fluctuance  EKG: Marked sinus bradycardia with a rate of 46, first-degree AV block, no changes to T waves or ST segments compared to EKG from 01/15/2022 so   Assessment/Plan: Please see individual problem list.  Problem List Items Addressed This Visit     Atrial fibrillation ([place]) (Chronic)    She will continue her workup with cardiology.  She will continue Eliquis 2.5 mg twice daily.  Metoprolol and amiodarone managed by cardiology.  Patient is in sinus bradycardia.  Given the degree of sinus bradycardia I sent a message to her EP physician.  We are awaiting his response. Given that the patient is asymptomatic at this time an in no acute distress I am going to let her leave the office and we will touch base with her by phone once I hear back from Dr Quentin Ore.       CHF (congestive heart failure) ([facility]) (Chronic)    Patient has had some shortness of breath that is progressive.  This could be related to her A-fib or CHF or an underlying ischemic issue or an underlying lung issue.  We will workup with BNP, CMP, CBC, TSH, EKG, and chest x-ray.      Relevant Orders   B Nat Peptide   Ankle wound, left, subsequent encounter    Refer to wound care.      DOE (dyspnea on exertion) - Primary   Relevant Orders   EKG 12-Lead (Completed)   CBC   TSH   Comp Met (CMET)   B Nat Peptide   DG Chest 2 View   Osteoporosis    Checking vitamin D level.  Discussed options for treatment.  She reports history of changes in her esophagus that were not quite Barrett's  esophagus.  Given this Fosamax is not a good option.  Discussed Prolia and zoledronic acid.  Discussed potential for aching with each of these.  Discussed the potential for osteonecrosis of the jaw with each of these.  Discussed that the osteonecrosis of the jaw seems to typically only occur if somebody is having work done on their teeth or jaw.  Discussed it can happen at other times.  The patient would like to think about treatment options and discuss them again at her next visit.      Relevant Orders   Vitamin D (25 hydroxy)   Pneumothorax    Patient has normal breath sounds on exam.  We are checking a chest x-ray today given her shortness of breath complaint.       Return in about 3 months (around 05/30/2022).  I have spent 45 minutes in the care of this patient regarding history taking, documentation, completion of exam, counseling on osteoporosis medications, placing orders, attempting to communicate with cardiology.   [name], MD [place]

## 2022-02-27 NOTE — Telephone Encounter (Signed)
Sent to pharmacy 

## 2022-02-27 NOTE — Assessment & Plan Note (Addendum)
She will continue her workup with cardiology.  She will continue Eliquis 2.5 mg twice daily.  Metoprolol and amiodarone managed by cardiology.  Patient is in sinus bradycardia.  Given the degree of sinus bradycardia I sent a message to her EP physician.  We are awaiting his response. Given that the patient is asymptomatic at this time an in no acute distress I am going to let her leave the office and we will touch base with her by phone once I hear back from Dr Quentin Ore.

## 2022-02-27 NOTE — Assessment & Plan Note (Signed)
Refer to wound care.

## 2022-02-27 NOTE — Assessment & Plan Note (Signed)
Patient has normal breath sounds on exam.  We are checking a chest x-ray today given her shortness of breath complaint.

## 2022-02-27 NOTE — Assessment & Plan Note (Signed)
Patient has had some shortness of breath that is progressive.  This could be related to her A-fib or CHF or an underlying ischemic issue or an underlying lung issue.  We will workup with BNP, CMP, CBC, TSH, EKG, and chest x-ray.

## 2022-02-27 NOTE — Addendum Note (Signed)
Addended by: [name] on: 02/27/2022 09:02 AM   Modules accepted: Orders

## 2022-02-27 NOTE — Assessment & Plan Note (Signed)
Checking vitamin D level.  Discussed options for treatment.  She reports history of changes in her esophagus that were not quite Barrett's esophagus.  Given this Fosamax is not a good option.  Discussed Prolia and zoledronic acid.  Discussed potential for aching with each of these.  Discussed the potential for osteonecrosis of the jaw with each of these.  Discussed that the osteonecrosis of the jaw seems to typically only occur if somebody is having work done on their teeth or jaw.  Discussed it can happen at other times.  The patient would like to think about treatment options and discuss them again at her next visit.

## 2022-02-27 NOTE — Patient Instructions (Addendum)
Nice to see you. We will contact you with your lab results. I am awaiting on cardiology to respond to my message.  Once I hear back from them we will call you.  If you develop worsening shortness of breath, chest pain, or any new symptoms you need to seek medical attention immediately. Please let me know if you would like to proceed with prolia.

## 2022-03-02 ENCOUNTER — Telehealth: Payer: Self-pay

## 2022-03-02 ENCOUNTER — Other Ambulatory Visit (INDEPENDENT_AMBULATORY_CARE_PROVIDER_SITE_OTHER): Payer: Medicare Other

## 2022-03-02 ENCOUNTER — Other Ambulatory Visit: Payer: Medicare Other

## 2022-03-02 ENCOUNTER — Telehealth: Payer: Self-pay | Admitting: *Deleted

## 2022-03-02 ENCOUNTER — Ambulatory Visit: Payer: Medicare Other | Admitting: Family Medicine

## 2022-03-02 DIAGNOSIS — I509 Heart failure, unspecified: Secondary | ICD-10-CM | POA: Diagnosis not present

## 2022-03-02 MED ORDER — AMIODARONE HCL 200 MG PO TABS: 100.0000 mg | ORAL_TABLET | Freq: Every day | ORAL | 3 refills | Status: DC

## 2022-03-02 NOTE — Telephone Encounter (Signed)
Spoke to Patient to let her know the chest xray showed some fluid around the left lung and Dr. Caryl Bis recommends seeing how the extra Lasix is working and going from there.

## 2022-03-02 NOTE — Addendum Note (Signed)
Addended by: [name] on: 03/02/2022 04:15 PM   Modules accepted: Orders

## 2022-03-02 NOTE — Progress Notes (Signed)
She was supposed to come back today to have labs repeated as I advised her to increase her lasix last week. We need to recheck her kidneys with the increase in the lasix dose and recheck her liver enzymes as they were elevated when we checked them last week. Please reinforce that she needs to have labs again this week to follow-up from last week. Thanks.

## 2022-03-02 NOTE — Progress Notes (Signed)
FYI: Patient no showed for lab appt this morning to recheck CMP.  See lab note from 02/27/22 below:  Please call the patient and let her know that it appears she may be retaining some fluid.  I would like for her to take a whole tablet of her Lasix today and tomorrow and then she will go back to taking half a tablet daily.  I would like to have her labs rechecked on Monday.  Her liver enzymes are mildly elevated.  We will recheck those on Monday as well.

## 2022-03-02 NOTE — Progress Notes (Signed)
It may take a while to get her in to wound care. The referral has been placed and if she does not hear anything in the next week or so she should let us know.

## 2022-03-02 NOTE — Telephone Encounter (Signed)
Patient verbalized understanding and agreement.

## 2022-03-02 NOTE — Telephone Encounter (Signed)
-----   Message from Vickie Epley, MD sent at 02/27/2022  7:28 PM EDT ----- Otila Kluver, Can you have the patient decrease her dose of amiodarone to '100mg'$  PO daily. This will help her resting heart rate come up slightly and may improve her shortness of breath. Thanks, Lysbeth Galas T. Quentin Ore, MD, [facility], [facility] Cardiac Electrophysiology

## 2022-03-02 NOTE — Progress Notes (Signed)
Noted. Please call the patient and get her rescheduled for her lab appointment for some time this week. Thanks.

## 2022-03-02 NOTE — Progress Notes (Signed)
Spoke to Patient to let her know the chest xray showed some fluid around the left lung and Dr. Caryl Bis recommends seeing how the extra Lasix is working and going from there.

## 2022-03-03 ENCOUNTER — Telehealth: Payer: Self-pay

## 2022-03-03 LAB — COMPREHENSIVE METABOLIC PANEL
ALT: 59 U/L — ABNORMAL HIGH (ref 0–35)
AST: 60 U/L — ABNORMAL HIGH (ref 0–37)
Albumin: 4.1 g/dL (ref 3.5–5.2)
Alkaline Phosphatase: 93 U/L (ref 39–117)
BUN: 23 mg/dL (ref 6–23)
CO2: 25 mEq/L (ref 19–32)
Calcium: 9.7 mg/dL (ref 8.4–10.5)
Chloride: 102 mEq/L (ref 96–112)
Creatinine, Ser: 1.22 mg/dL — ABNORMAL HIGH (ref 0.40–1.20)
GFR: 41.2 mL/min — ABNORMAL LOW (ref >60.00)
Glucose, Bld: 85 mg/dL (ref 70–99)
Potassium: 3.9 mEq/L (ref 3.5–5.1)
Sodium: 142 mEq/L (ref 135–145)
Total Bilirubin: 0.4 mg/dL (ref 0.2–1.2)
Total Protein: 7 g/dL (ref 6.0–8.3)

## 2022-03-03 NOTE — Telephone Encounter (Signed)
Dana Haven, MD  [name], CMA Please let the patient know her liver enzymes remain mildly elevated.  She needs to reduce her alcohol intake given that these may be elevated related to that.  Her kidney function has improved slightly.  Has her breathing improved with taking the extra Lasix over the weekend?

## 2022-03-04 NOTE — Telephone Encounter (Signed)
Pt called back and I read the message to her but pt need more clarification

## 2022-03-04 NOTE — Telephone Encounter (Signed)
Noted. Lets see how she does with the amiodarone change. We can recheck her liver enzymes in a month with a hepatic function panel.

## 2022-03-06 ENCOUNTER — Telehealth: Payer: Self-pay | Admitting: Internal Medicine

## 2022-03-06 MED ORDER — FUROSEMIDE 20 MG PO TABS: 20.0000 mg | ORAL_TABLET | Freq: Every day | ORAL | 1 refills | Status: DC

## 2022-03-06 NOTE — Telephone Encounter (Signed)
  Vickie Epley, MD  Sent: Fri March 06, 2022  3:49 PM  To: Emily Filbert, RN          Message  As long as she is taking her Eliquis as prescribed, I do not need any additional imaging.  Thanks,  Lysbeth Galas T. Quentin Ore, MD, [facility], [facility]  Cardiac Electrophysiology

## 2022-03-06 NOTE — Telephone Encounter (Signed)
1) Reached out to Dr. Caryl Comes by Secure Chat to please review the patient's Cardiac CT.   Message received from  Dr. Caryl Comes stating: I would tell her that it shows some low grade blockage but not anything that should be causing symptoms. It raises the question of whether she should take cholesterol Therapy. We can talk about that later in the office. . I am not sure that, while generally recommended, whether it is worth it. Thx. S   I called and spoke with the patient and advised her of her results and MD recommendations.  The patient voices understanding and is agreeable.   2) She also called on 02/25/22 with concerns about her Cardiac CT being done now and this having to be repeated prior to her November ablation with Dr. Quentin Ore.  I advised the patient on 8/9, that I spoke with Dr. Quentin Ore after her visit on 02/11/22 and made him aware of Dr. Olin Pia recommendations for a Cardiac CT to rule out any obstructive disease that may be contributing to her SOB.  At that time, Dr. Quentin Ore advised me that he could get what he needed off the Cardiac CT done now, for her ablation in November (Dr. Quentin Ore notified of procedure date during my discussion with him).  The patient would still like to confirm with Dr. Quentin Ore if anything else will be needed prior to her November ablation aside from her lab work.   The patient is aware I will follow up with Dr. Quentin Ore and call her back next week.   3) The patient then asked if I could clarify how she should be taking her lasix as she has been receiving multiple calls from Dr. Ellen Henri office and our office, that she did not recognize typically speaking with.  Reviewed the patient's chart and notified her: - Per her visit with Dr. Caryl Bis, she was having asymptomatic bradycardia. - he reached out to Dr. Quentin Ore, who then advised his nurse to call her and have her decrease amiodarone to 100 mg once daily. - she also advised she was told to take her lasix 40 mg  0.5 tablet once daily - I advised her that per her lab from Dr. Caryl Bis dated 02/27/22: Please call the patient and let her know that it appears she may be retaining some fluid.  I would like for her to take a whole tablet of her Lasix today and tomorrow and then she will go back to taking half a tablet daily.  I would like to have her labs rechecked on Monday.  Her liver enzymes are mildly elevated.  We will recheck those on Monday as well.  - the patient confirms she is still taking lasix 20 mg once daily total. - her repeat BMP on 8/14 showed her kidney function was slightly improved. - she will continue lasix 20 mg once daily. - she is having trouble cutting the lasix 40 mg tablets in half. - I advised her I will send in lasix 20 mg tablets to her pharmacy. - the patient again voiced understanding and was agreeable.

## 2022-03-06 NOTE — Telephone Encounter (Signed)
I spoke with the patient and advised her of Dr. Mardene Speak recommendations as stated below.  The patient is advised to NOT miss any doses of her Eliquis and confirmed understanding. She was appreciative of the call back.

## 2022-03-06 NOTE — Telephone Encounter (Signed)
See 03/06/22 phone note for this issue.

## 2022-03-06 NOTE — Telephone Encounter (Signed)
Patient is requesting a call back from He[place], RN if possible to discuss results.

## 2022-03-19 DIAGNOSIS — H353132 Nonexudative age-related macular degeneration, bilateral, intermediate dry stage: Secondary | ICD-10-CM | POA: Diagnosis not present

## 2022-03-29 ENCOUNTER — Other Ambulatory Visit: Payer: Self-pay | Admitting: Cardiology

## 2022-03-31 ENCOUNTER — Encounter: Payer: Medicare Other | Attending: Physician Assistant | Admitting: Physician Assistant

## 2022-03-31 DIAGNOSIS — L97322 Non-pressure chronic ulcer of left ankle with fat layer exposed: Secondary | ICD-10-CM | POA: Insufficient documentation

## 2022-03-31 DIAGNOSIS — I480 Paroxysmal atrial fibrillation: Secondary | ICD-10-CM | POA: Insufficient documentation

## 2022-03-31 DIAGNOSIS — I130 Hypertensive heart and chronic kidney disease with heart failure and stage 1 through stage 4 chronic kidney disease, or unspecified chronic kidney disease: Secondary | ICD-10-CM | POA: Diagnosis not present

## 2022-03-31 DIAGNOSIS — N183 Chronic kidney disease, stage 3 unspecified: Secondary | ICD-10-CM | POA: Diagnosis not present

## 2022-03-31 DIAGNOSIS — Z7901 Long term (current) use of anticoagulants: Secondary | ICD-10-CM | POA: Diagnosis not present

## 2022-03-31 DIAGNOSIS — I5042 Chronic combined systolic (congestive) and diastolic (congestive) heart failure: Secondary | ICD-10-CM | POA: Insufficient documentation

## 2022-03-31 DIAGNOSIS — I87322 Chronic venous hypertension (idiopathic) with inflammation of left lower extremity: Secondary | ICD-10-CM | POA: Diagnosis not present

## 2022-04-01 NOTE — Progress Notes (Signed)
[name], [name] ([id number]) Visit Report for 03/31/2022 Allergy List Details Patient Name: [name], [name]. Date of Service: 03/31/2022 8:45 AM Medical Record Number: [id number] Patient Account Number: [id number] Date of Birth/Sex: 1940-11-08 (81 y.o. F) Treating RN: Carlene Coria Primary Care Dorothymae Maciver: [name] Other Clinician: Referring Shauntavia Brackin: [name] Treating Rage Beever/Extender: Jeri Cos Weeks in Treatment: 0 Allergies Active Allergies No Known Allergies Allergy Notes Electronic Signature(s) Signed: 04/01/2022 8:36:51 AM By: Carlene Coria RN Entered By: Carlene Coria on 03/31/2022 08:52:58 [name], [name] ([id number]) -------------------------------------------------------------------------------- [place] Details Patient Name: [name] Date of Service: 03/31/2022 8:45 AM Medical Record Number: [id number] Patient Account Number: [id number] Date of Birth/Sex: January 15, 1941 (81 y.o. F) Treating RN: Carlene Coria Primary Care Caidence Kaseman: [name] Other Clinician: Referring Mariavictoria Nottingham: [name] Treating Rabecka Brendel/Extender: Skipper Cliche in Treatment: 0 Visit Information Patient Arrived: Ambulatory Arrival Time: 08:45 Accompanied By: self Transfer Assistance: None Patient Identification Verified: Yes Secondary Verification Process Completed: Yes Patient Requires Transmission-Based Precautions: No Patient Has Alerts: No Electronic Signature(s) Signed: 04/01/2022 8:36:51 AM By: Carlene Coria RN Entered By: Carlene Coria on 03/31/2022 08:50:01 [name] ([id number]) -------------------------------------------------------------------------------- Clinic Level of Care Assessment Details Patient Name: [name], [name]. Date of Service: 03/31/2022 8:45 AM Medical Record Number: [id number] Patient Account Number: [id number] Date of Birth/Sex: 01/26/1941 (81 y.o. F) Treating RN: Carlene Coria Primary Care  Tenea Sens: [name] Other Clinician: Referring Tanasia Budzinski: [name] Treating Cedrik Heindl/Extender: Skipper Cliche in Treatment: 0 Clinic Level of Care Assessment Items TOOL 2 Quantity Score X - Use when only an EandM is performed on the INITIAL visit 1 0 ASSESSMENTS - Nursing Assessment / Reassessment X - General Physical Exam (combine w/ comprehensive assessment (listed just below) when performed on new 1 20 pt. evals) X- 1 25 Comprehensive Assessment (HX, ROS, Risk Assessments, Wounds Hx, etc.) ASSESSMENTS - Wound and Skin Assessment / Reassessment X - Simple Wound Assessment / Reassessment - one wound 1 5 '[]'$  - 0 Complex Wound Assessment / Reassessment - multiple wounds '[]'$  - 0 Dermatologic / Skin Assessment (not related to wound area) ASSESSMENTS - Ostomy and/or Continence Assessment and Care '[]'$  - Incontinence Assessment and Management 0 '[]'$  - 0 Ostomy Care Assessment and Management (repouching, etc.) PROCESS - Coordination of Care X - Simple Patient / Family Education for ongoing care 1 15 '[]'$  - 0 Complex (extensive) Patient / Family Education for ongoing care X- 1 10 Staff obtains Programmer, systems, Records, Test Results / Process Orders '[]'$  - 0 Staff telephones HHA, Nursing Homes / Clarify orders / etc '[]'$  - 0 Routine Transfer to another Facility (non-emergent condition) '[]'$  - 0 Routine Hospital Admission (non-emergent condition) X- 1 15 New Admissions / Biomedical engineer / Ordering NPWT, Apligraf, etc. '[]'$  - 0 Emergency Hospital Admission (emergent condition) X- 1 10 Simple Discharge Coordination '[]'$  - 0 Complex (extensive) Discharge Coordination PROCESS - Special Needs '[]'$  - Pediatric / Minor Patient Management 0 '[]'$  - 0 Isolation Patient Management '[]'$  - 0 Hearing / Language / Visual special needs '[]'$  - 0 Assessment of Community assistance (transportation, D/C planning, etc.) '[]'$  - 0 Additional assistance / Altered mentation '[]'$  - 0 Support Surface(s)  Assessment (bed, cushion, seat, etc.) INTERVENTIONS - Wound Cleansing / Measurement X - Wound Imaging (photographs - any number of wounds) 1 5 '[]'$  - 0 Wound Tracing (instead of photographs) X- 1 5 Simple Wound Measurement - one wound '[]'$  - 0 Complex Wound Measurement - multiple wounds Lumbra, Kysha S. ([id number]) X- 1 5 Simple Wound  Cleansing - one wound '[]'$  - 0 Complex Wound Cleansing - multiple wounds INTERVENTIONS - Wound Dressings X - Small Wound Dressing one or multiple wounds 1 10 '[]'$  - 0 Medium Wound Dressing one or multiple wounds '[]'$  - 0 Large Wound Dressing one or multiple wounds '[]'$  - 0 Application of Medications - injection INTERVENTIONS - Miscellaneous '[]'$  - External ear exam 0 '[]'$  - 0 Specimen Collection (cultures, biopsies, blood, body fluids, etc.) '[]'$  - 0 Specimen(s) / Culture(s) sent or taken to Lab for analysis '[]'$  - 0 Patient Transfer (multiple staff / Civil Service fast streamer / Similar devices) '[]'$  - 0 Simple Staple / Suture removal (25 or less) '[]'$  - 0 Complex Staple / Suture removal (26 or more) '[]'$  - 0 Hypo / Hyperglycemic Management (close monitor of Blood Glucose) X- 1 15 Ankle / Brachial Index (ABI) - do not check if billed separately Has the patient been seen at the hospital within the last three years: Yes Total Score: 140 Level Of Care: New/Established - Level 4 Electronic Signature(s) Signed: 04/01/2022 8:36:51 AM By: Carlene Coria RN Entered By: Carlene Coria on 03/31/2022 09:59:17 [name] ([id number]) -------------------------------------------------------------------------------- Encounter Discharge Information Details Patient Name: [name]. Date of Service: 03/31/2022 8:45 AM Medical Record Number: [id number] Patient Account Number: [id number] Date of Birth/Sex: June 23, 1941 (81 y.o. F) Treating RN: Carlene Coria Primary Care Najeh Credit: [name] Other Clinician: Referring Zamya Culhane: [name] Treating Riggin Cuttino/Extender:  Skipper Cliche in Treatment: 0 Encounter Discharge Information Items Discharge Condition: Stable Ambulatory Status: Ambulatory Discharge Destination: Home Transportation: Private Auto Accompanied By: self Schedule Follow-up Appointment: Yes Clinical Summary of Care: Electronic Signature(s) Signed: 03/31/2022 10:00:05 AM By: Carlene Coria RN Entered By: Carlene Coria on 03/31/2022 10:00:05 [name] ([id number]) -------------------------------------------------------------------------------- Lower Extremity Assessment Details Patient Name: [name]. Date of Service: 03/31/2022 8:45 AM Medical Record Number: [id number] Patient Account Number: [id number] Date of Birth/Sex: 1940-11-23 (81 y.o. F) Treating RN: Carlene Coria Primary Care Emersynn Deatley: [name] Other Clinician: Referring Bailie Christenbury: [name] Treating Peni Rupard/Extender: Skipper Cliche in Treatment: 0 Edema Assessment Assessed: [Left: No] [Right: No] Edema: [Left: N] [Right: o] Calf Left: Right: Point of Measurement: 32 cm From Medial Instep 28 cm Ankle Left: Right: Point of Measurement: 10 cm From Medial Instep 19 cm Knee To Floor Left: Right: From Medial Instep 42 cm Vascular Assessment Pulses: Dorsalis Pedis Palpable: [Left:Yes] Doppler Audible: [Left:Yes] Blood Pressure: Brachial: [Left:148] Ankle: [Left:Dorsalis Pedis: 180 1.22] Electronic Signature(s) Signed: 04/01/2022 8:36:51 AM By: Carlene Coria RN Entered By: Carlene Coria on 03/31/2022 09:09:30 [name] ([id number]) -------------------------------------------------------------------------------- Multi Wound Chart Details Patient Name: [name]. Date of Service: 03/31/2022 8:45 AM Medical Record Number: [id number] Patient Account Number: [id number] Date of Birth/Sex: 07/05/1941 (81 y.o. F) Treating RN: Carlene Coria Primary Care Eliah Ozawa: [name] Other Clinician: Referring Irja Wheless:  [name] Treating Francies Inch/Extender: Skipper Cliche in Treatment: 0 Vital Signs Height(in): 65 Pulse(bpm): 17 Weight(lbs): 115 Blood Pressure(mmHg): 148/79 Body Mass Index(BMI): 19.1 Temperature(F): 97.7 Respiratory Rate(breaths/min): 18 Photos: [N/A:N/A] Wound Location: Left, Medial Ankle N/A N/A Wounding Event: Gradually Appeared N/A N/A Primary Etiology: Venous Leg Ulcer N/A N/A Comorbid History: Arrhythmia, Congestive Heart N/A N/A Failure, Hypertension, Received Radiation Date Acquired: 08/20/2021 N/A N/A Weeks of Treatment: 0 N/A N/A Wound Status: Open N/A N/A Wound Recurrence: No N/A N/A Measurements L x W x D (cm) 0.1x0.1x0.1 N/A N/A Area (cm) : 0.008 N/A N/A Volume (cm) : 0.001 N/A N/A Classification: Full Thickness Without Exposed N/A N/A Support  Structures Exudate Amount: Medium N/A N/A Exudate Type: Serosanguineous N/A N/A Exudate Color: red, brown N/A N/A Wound Margin: Flat and Intact N/A N/A Granulation Amount: Large (67-100%) N/A N/A Granulation Quality: Red N/A N/A Necrotic Amount: None Present (0%) N/A N/A Exposed Structures: Fat Layer (Subcutaneous Tissue): N/A N/A Yes Fascia: No Tendon: No Muscle: No Joint: No Bone: No Epithelialization: None N/A N/A Treatment Notes Electronic Signature(s) Signed: 04/01/2022 8:36:51 AM By: Carlene Coria RN Entered By: Carlene Coria on 03/31/2022 09:35:46 [name], [name] ([id number]) -------------------------------------------------------------------------------- Multi-Disciplinary Care Plan Details Patient Name: [name]. Date of Service: 03/31/2022 8:45 AM Medical Record Number: [id number] Patient Account Number: [id number] Date of Birth/Sex: 14-Nov-1940 (81 y.o. F) Treating RN: Carlene Coria Primary Care Bohdan Macho: [name] Other Clinician: Referring Charvis Lightner: [name] Treating Demetress Tift/Extender: Skipper Cliche in Treatment: 0 Active Inactive Wound/Skin  Impairment Nursing Diagnoses: Knowledge deficit related to ulceration/compromised skin integrity Goals: Patient/caregiver will verbalize understanding of skin care regimen Date Initiated: 03/31/2022 Target Resolution Date: 04/30/2022 Goal Status: Active Ulcer/skin breakdown will have a volume reduction of 30% by week 4 Date Initiated: 03/31/2022 Target Resolution Date: 04/30/2022 Goal Status: Active Ulcer/skin breakdown will have a volume reduction of 50% by week 8 Date Initiated: 03/31/2022 Target Resolution Date: 05/31/2022 Goal Status: Active Ulcer/skin breakdown will have a volume reduction of 80% by week 12 Date Initiated: 03/31/2022 Target Resolution Date: 06/30/2022 Goal Status: Active Ulcer/skin breakdown will heal within 14 weeks Date Initiated: 03/31/2022 Target Resolution Date: 07/31/2022 Goal Status: Active Interventions: Assess patient/caregiver ability to obtain necessary supplies Assess patient/caregiver ability to perform ulcer/skin care regimen upon admission and as needed Assess ulceration(s) every visit Notes: Electronic Signature(s) Signed: 04/01/2022 8:36:51 AM By: Carlene Coria RN Entered By: Carlene Coria on 03/31/2022 09:34:56 [name], [name] ([id number]) -------------------------------------------------------------------------------- Pain Assessment Details Patient Name: [name]. Date of Service: 03/31/2022 8:45 AM Medical Record Number: [id number] Patient Account Number: [id number] Date of Birth/Sex: 1941-03-13 (81 y.o. F) Treating RN: Carlene Coria Primary Care Kendallyn Lippold: [name] Other Clinician: Referring Melven Stockard: [name] Treating Reatha Sur/Extender: Skipper Cliche in Treatment: 0 Active Problems Location of Pain Severity and Description of Pain Patient Has Paino Yes Site Locations With Dressing Change: Yes Duration of the Pain. Constant / Intermittento Intermittent Rate the pain. Current Pain Level: 0 Worst Pain  Level: 6 Least Pain Level: 0 Tolerable Pain Level: 5 Character of Pain Describe the Pain: Sharp, Shooting Pain Management and Medication Current Pain Management: Medication: Yes Cold Application: No Rest: Yes Massage: No Activity: No T.E.N.S.: No Heat Application: No Leg drop or elevation: No Is the Current Pain Management Adequate: Inadequate How does your wound impact your activities of daily livingo Sleep: Yes Bathing: No Appetite: No Relationship With Others: No Bladder Continence: No Emotions: No Bowel Continence: No Work: No Toileting: No Drive: No Dressing: No Hobbies: No Electronic Signature(s) Signed: 04/01/2022 8:36:51 AM By: Carlene Coria RN Entered By: Carlene Coria on 03/31/2022 08:52:15 [name] ([id number]) -------------------------------------------------------------------------------- Patient/Caregiver Education Details Patient Name: [name]. Date of Service: 03/31/2022 8:45 AM Medical Record Number: [id number] Patient Account Number: [id number] Date of Birth/Gender: 1940/08/27 (81 y.o. F) Treating RN: Carlene Coria Primary Care Physician: [name] Other Clinician: Referring Physician: [name] Treating Physician/Extender: Skipper Cliche in Treatment: 0 Education Assessment Education Provided To: Patient Education Topics Provided Wound/Skin Impairment: Methods: Explain/Verbal Responses: State content correctly Electronic Signature(s) Signed: 04/01/2022 8:36:51 AM By: Carlene Coria RN Entered By: Carlene Coria on 03/31/2022 09:59:31 [name], [name] ([id number]) --------------------------------------------------------------------------------  Wound Assessment Details Patient Name: [name], [name]. Date of Service: 03/31/2022 8:45 AM Medical Record Number: [id number] Patient Account Number: [id number] Date of Birth/Sex: [birth year] (81 y.o. F) Treating RN: Carlene Coria Primary Care Petina Muraski: [name] Other Clinician: Referring Treyten Monestime: [name] Treating Danayah Smyre/Extender: Skipper Cliche in Treatment: 0 Wound Status Wound Number: 1 Primary Venous Leg Ulcer Etiology: Wound Location: Left, Medial Ankle Wound Status: Open Wounding Event: Gradually Appeared Comorbid Arrhythmia, Congestive Heart Failure, Hypertension, Date Acquired: 08/20/2021 History: Received Radiation Weeks Of Treatment: 0 Clustered Wound: No Photos Wound Measurements Length: (cm) 0.1 Width: (cm) 0.1 Depth: (cm) 0.1 Area: (cm) 0.008 Volume: (cm) 0.001 % Reduction in Area: % Reduction in Volume: Epithelialization: None Tunneling: No Undermining: No Wound Description Classification: Full Thickness Without Exposed Support Structu Wound Margin: Flat and Intact Exudate Amount: Medium Exudate Type: Serosanguineous Exudate Color: red, brown res Foul Odor After Cleansing: No Slough/Fibrino No Wound Bed Granulation Amount: Large (67-100%) Exposed Structure Granulation Quality: Red Fascia Exposed: No Necrotic Amount: None Present (0%) Fat Layer (Subcutaneous Tissue) Exposed: Yes Tendon Exposed: No Muscle Exposed: No Joint Exposed: No Bone Exposed: No Treatment Notes Wound #1 (Ankle) Wound Laterality: Left, Medial Cleanser Peri-Wound Care Topical Kathol, Shuronda S. ([id number]) Primary Dressing Xeroform 4x4-HBD (in/in) Discharge Instruction: Apply Xeroform 4x4-HBD (in/in) as directed Secondary Dressing Coverlet Latex-Free Fabric Adhesive Dressings Discharge Instruction: 1.5 x 2 Secured With Tubigrip Size C, 2.75x10 (in/yd) Discharge Instruction: Apply 3 Tubigrip C 3-finger-widths below knee to base of toes to secure dressing and/or for swelling. Compression Wrap Compression Stockings Add-Ons Electronic Signature(s) Signed: 04/01/2022 8:36:51 AM By: Carlene Coria RN Entered By: Carlene Coria on 03/31/2022 09:07:03 [name], [name]  ([id number]) -------------------------------------------------------------------------------- [place] Details Patient Name: [name] Date of Service: 03/31/2022 8:45 AM Medical Record Number: [id number] Patient Account Number: [id number] Date of Birth/Sex: [birth year] (81 y.o. F) Treating RN: Carlene Coria Primary Care Jonathen Rathman: [name] Other Clinician: Referring Brighten Orndoff: [name] Treating Dhwani Venkatesh/Extender: Skipper Cliche in Treatment: 0 Vital Signs Time Taken: 08:52 Temperature (F): 97.7 Height (in): 65 Pulse (bpm): 66 Source: Stated Respiratory Rate (breaths/min): 18 Weight (lbs): 115 Blood Pressure (mmHg): 148/79 Source: Stated Reference Range: 80 - 120 mg / dl Body Mass Index (BMI): 19.1 Electronic Signature(s) Signed: 04/01/2022 8:36:51 AM By: Carlene Coria RN Entered By: Carlene Coria on 03/31/2022 08:52:45

## 2022-04-01 NOTE — Progress Notes (Addendum)
Dana, Harris ([id number]) [id number] Page 1 of 8 Visit Report for 03/31/2022 Chief Complaint Document Details Patient Name: Date of Service: [name], [name] 03/31/2022 8:45 A M Medical Record Number: [id number] Patient Account Number: [id number] Date of Birth/Sex: Treating RN: [birth year] (81 y.o. [name] Primary Care Provider: [name] Other Clinician: Referring Provider: Treating Provider/Extender: Cecelia Byars in Treatment: 0 Information Obtained from: Patient Chief Complaint Left medial ankle ulcer Electronic Signature(s) Signed: 03/31/2022 9:30:03 AM By: Worthy Keeler PA-C Entered By: Worthy Keeler on 03/31/2022 09:30:03 -------------------------------------------------------------------------------- HPI Details Patient Name: Date of Service: Dana, Harris 03/31/2022 8:45 A M Medical Record Number: [id number] Patient Account Number: [id number] Date of Birth/Sex: Treating RN: 11/21/1940 (81 y.o. [name] Primary Care Provider: [name] Other Clinician: Referring Provider: Treating Provider/Extender: Cecelia Byars in Treatment: 0 History of Present Illness HPI Description: 03-31-2022 upon evaluation today patient appears to be doing decently well all things considered in regard to the wound on her left medial ankle. She tells me this has been coming and going since February. She does have some edema although her leg is not terribly large she has at least 2+ pitting edema on the left leg which is definitely worse in the right leg. She tells me that overall when she made the appointment the wound was larger I did look at notes and records in epic and I did indeed see signs that this was significantly larger than what it is currently when she was first being referred to [place]. Nonetheless at this point he does appear to be doing much better which is great news.  Fortunately I do not see any evidence of active infection locally or systemically which is excellent as well. The patient does have a history of chronic venous insufficiency, paroxysmal atrial fibrillation, long-term use of anticoagulant she is on Eliquis due to the A-fib, hypertension, chronic kidney disease stage III, and congestive heart failure. Fortunately she tells me that most of these issues are very well controlled and she is doing well currently. Electronic Signature(s) Signed: 03/31/2022 5:14:28 PM By: Worthy Keeler PA-C Entered By: Worthy Keeler on 03/31/2022 17:14:28 [name] ([id number]) [id number] Page 2 of 8 -------------------------------------------------------------------------------- Physical Exam Details Patient Name: Date of Service: Dana, Harris 03/31/2022 8:45 A M Medical Record Number: [id number] Patient Account Number: [id number] Date of Birth/Sex: Treating RN: 1940/10/14 (81 y.o. [name] Primary Care Provider: [name] Other Clinician: Referring Provider: Treating Provider/Extender: Cecelia Byars in Treatment: 0 Constitutional patient is hypertensive.. pulse regular and within target range for patient.Marland Kitchen respirations regular, non-labored and within target range for patient.Marland Kitchen temperature within target range for patient.. Well-nourished and well-hydrated in no acute distress. Eyes conjunctiva clear no eyelid edema noted. pupils equal round and reactive to light and accommodation. Ears, Nose, Mouth, and Throat no gross abnormality of ear auricles or external auditory canals. normal hearing noted during conversation. mucus membranes moist. Respiratory normal breathing without difficulty. Cardiovascular 2+ dorsalis pedis/posterior tibialis pulses. 2+ pitting edema of the bilateral lower extremities. Musculoskeletal normal gait and posture. no significant deformity or arthritic  changes, no loss or range of motion, no clubbing. Psychiatric this patient is able to make decisions and demonstrates good insight into disease process. Alert and Oriented x 3. pleasant and cooperative. Notes Upon inspection patient's wound is literally just a small pinpoint area there really is not much to this which is good news. With that being  said I do see evidence that there again is at least that small opening and again I think that there has been a bigger issue based on what I see looking through her records. Nonetheless I think what she really needs is to have some type of compression therapy that likely will prevent this from continuing to reopen the problem as she tells me she cannot get a lot of compression socks on. Running attempting try Tubigrip for the time being. Electronic Signature(s) Signed: 03/31/2022 5:15:08 PM By: Worthy Keeler PA-C Entered By: Worthy Keeler on 03/31/2022 17:15:08 -------------------------------------------------------------------------------- Physician Orders Details Patient Name: Date of Service: Dana, Harris 03/31/2022 8:45 A M Medical Record Number: [id number] Patient Account Number: [id number] Date of Birth/Sex: Treating RN: February 10, 1941 (81 y.o. 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 Bathing/ Shower/ Hygiene May shower; gently cleanse wound with antibacterial soap, rinse and pat dry prior to dressing wounds Edema Control - Lymphedema / Segmental Compressive Device / Other Tubigrip single layer applied. - size c Elevate, Exercise Daily and A void Standing for Long Periods of Time. Elevate legs to the level of the heart and pump ankles as often as possible Elevate leg(s) parallel to the floor when sitting. Wound Treatment Electronic Signature(s) Signed: 07/17/2022 11:05:31 AM By: Carlene Coria RN Signed: 07/31/2022 1:29:37 PM By: Worthy Keeler PA-C Previous Signature: 03/31/2022 5:16:25 PM Version By: Worthy Keeler PA-C Previous Signature: 04/01/2022 8:36:51 AM Version By: Carlene Coria RN Entered By: Carlene Coria on 07/09/2022 10:53:22 -------------------------------------------------------------------------------- Problem List Details Patient Name: Date of Service: [name] 03/31/2022 8:45 A M Medical Record Number: [id number] Patient Account Number: [id number] Date of Birth/Sex: Treating RN: Nov 18, 1940 (81 y.o. [name] Primary Care Provider: [name] Other Clinician: Referring Provider: Treating Provider/Extender: Cecelia Byars in Treatment: 0 Active Problems ICD-10 Encounter Code Description Active Date MDM Diagnosis I87.332 Chronic venous hypertension (idiopathic) with ulcer and inflammation of left 03/31/2022 No Yes lower extremity L97.322 Non-pressure chronic ulcer of left ankle with fat layer exposed 03/31/2022 No Yes I48.0 Paroxysmal atrial fibrillation 03/31/2022 No Yes AVAIAH, STEMPEL ([id number]) [id number] Page 4 of 8 Z79.01 Long term (current) use of anticoagulants 03/31/2022 No Yes I10 Essential (primary) hypertension 03/31/2022 No Yes N18.30  Chronic kidney disease, stage 3 unspecified 03/31/2022 No Yes I50.42 Chronic combined systolic (congestive) and diastolic (congestive) heart failure 03/31/2022 No Yes Inactive Problems Resolved Problems Electronic Signature(s) Signed: 03/31/2022 9:29:20 AM By: Worthy Keeler PA-C Entered By: Worthy Keeler on 03/31/2022 09:29:20 -------------------------------------------------------------------------------- Progress Note Details Patient Name: Date of Service: [name] 03/31/2022 8:45 A M Medical Record Number: [id number] Patient Account Number: [id number] Date of Birth/Sex: Treating RN: 1940/09/17 (81 y.o. [name] Primary Care Provider: [name] Other Clinician: Referring Provider: Treating Provider/Extender: Cecelia Byars in Treatment: 0 Subjective Chief Complaint Information obtained from Patient Left medial ankle ulcer History of Present  Illness (HPI) 03-31-2022 upon evaluation today patient appears to be doing decently well all things considered in regard to the wound on her left medial ankle. She tells me this has been coming and going since February. She does have some edema although her leg is not terribly large she has at least 2+ pitting edema on the left leg which is definitely worse in the right leg. She tells me that overall when she made the appointment the wound was larger I did look at notes and records in epic and I did indeed see signs that this was significantly larger than what it is currently when she was first being referred to [place]. Nonetheless at this point he does appear to be doing much better which is great news. Fortunately I do not see any evidence of active infection locally or systemically which is excellent as well. The patient does have a history of chronic venous insufficiency, paroxysmal atrial fibrillation, long-term use of anticoagulant she is on Eliquis due to the A-fib, hypertension, chronic kidney disease  stage III, and congestive heart failure. Fortunately she tells me that most of these issues are very well controlled and she is doing well currently. Patient History Allergies No Known Allergies Social History Former smoker, Marital Status - Widowed, Alcohol Use - Moderate, Drug Use - No History, Caffeine Use - Daily. ANDERIA, LORENZO ([id number]) [id number] Page 5 of 8 Medical History Cardiovascular Patient has history of Arrhythmia, Congestive Heart Failure, Hypertension Oncologic Patient has history of Received Radiation - breast 2008 Review of Systems (ROS) Genitourinary stage 3 Integumentary (Skin) Complains or has symptoms of Wounds. Psychiatric Denies complaints or symptoms of Anxiety, Claustrophobia. Objective Constitutional patient is hypertensive.. pulse regular and within target range for patient.Marland Kitchen respirations regular, non-labored and within target range for patient.Marland Kitchen temperature within target range for patient.. Well-nourished and well-hydrated in no acute distress. Vitals Time Taken: 8:52 AM, Height: 65 in, Source: Stated, Weight: 115 lbs, Source: Stated, BMI: 19.1, Temperature: 97.7 F, Pulse: 66 bpm, Respiratory Rate: 18 breaths/min, Blood Pressure: 148/79 mmHg. Eyes conjunctiva clear no eyelid edema noted. pupils equal round and reactive to light and accommodation. Ears, Nose, Mouth, and Throat no gross abnormality of ear auricles or external auditory canals. normal hearing noted during conversation. mucus membranes moist. Respiratory normal breathing without difficulty. Cardiovascular 2+ dorsalis pedis/posterior tibialis pulses. 2+ pitting edema of the bilateral lower extremities. Musculoskeletal normal gait and posture. no significant deformity or arthritic changes, no loss or range of motion, no clubbing. Psychiatric this patient is able to make decisions and demonstrates good insight into disease process. Alert and Oriented x  3. pleasant and cooperative. General Notes: Upon inspection patient's wound is literally just a small pinpoint area there really is not much to this which is good news. With that being said I do see evidence that there again is at least that small opening and again I think that there has been a bigger issue based on what I see looking through her records. Nonetheless I think what she really needs is to have some type of compression therapy that likely will prevent this from continuing to reopen the problem as she tells me she cannot get a lot of compression socks on. Running attempting try Tubigrip for the time being. Integumentary (Hair, Skin) Wound #1 status is Open. Original cause of wound was Gradually Appeared. The date acquired was: 08/20/2021. The wound is located on the Left,Medial Ankle. The wound measures 0.1cm length x 0.1cm width x 0.1cm depth; 0.008cm^2  area and 0.001cm^3 volume. There is Fat Layer (Subcutaneous Tissue) exposed. There is no tunneling or undermining noted. There is a medium amount of serosanguineous drainage noted. The wound margin is flat and intact. There is large (67-100%) red granulation within the wound bed. There is no necrotic tissue within the wound bed. 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 Bathing/ Shower/ Hygiene: TASMIN, EXANTUS ([id number]) [id number] Page 6 of 8 May shower; gently cleanse wound with antibacterial soap, rinse and pat dry prior to dressing wounds Edema Control -  Lymphedema / Segmental Compressive Device / Other: Tubigrip single layer applied. - size c Elevate, Exercise Daily and Avoid Standing for Long Periods of Time. Elevate legs to the level of the heart and pump ankles as often as possible Elevate leg(s) parallel to the floor when sitting. WOUND #1: - Ankle Wound Laterality: Left, Medial Prim Dressing: Xeroform 4x4-HBD (in/in) 3 x Per Week/30 Days ary Discharge Instructions: Apply Xeroform 4x4-HBD (in/in) as directed Secondary Dressing: Coverlet Latex-Free Fabric Adhesive Dressings 3 x Per Week/30 Days Discharge Instructions: 1.5 x 2 Secured With: Tubigrip Size C, 2.75x10 (in/yd) 3 x Per Week/30 Days Discharge Instructions: Apply 3 Tubigrip C 3-finger-widths below knee to base of toes to secure dressing and/or for swelling. 1. Based on what I am seeing at this time I would recommend that we go ahead and continue with the recommendation for some compression and I Minna suggest Tubigrip size C which I think could be helpful in helping to calm down some of her swelling in turn allow this area to heal much more effectively and quickly. 2. I am also can recommend that for the wound itself we use Xeroform followed by coverlet. 3. I am also can recommend the patient should elevate her legs much as possible to help with edema control as well. We will see patient back for reevaluation in 1 week here in the clinic. If anything worsens or changes patient will contact our office for additional recommendations. Electronic Signature(s) Signed: 03/31/2022 5:15:47 PM By: Worthy Keeler PA-C Entered By: Worthy Keeler on 03/31/2022 17:15:47 -------------------------------------------------------------------------------- ROS/PFSH Details Patient Name: Date of Service: SERENAH, MILL 03/31/2022 8:45 A M Medical Record Number: [id number] Patient Account Number: [id number] Date of Birth/Sex: Treating RN: Sep 19, 1940 (81 y.o. [name] Primary Care  Provider: [name] Other Clinician: Referring Provider: Treating Provider/Extender: Cecelia Byars in Treatment: 0 Integumentary (Skin) Complaints and Symptoms: Positive for: Wounds Psychiatric Complaints and Symptoms: Negative for: Anxiety; Claustrophobia Cardiovascular Medical History: Positive for: Arrhythmia; Congestive Heart Failure; Hypertension Genitourinary Complaints and Symptoms: Review of System Notes: stage 3 Oncologic Medical History: Positive for: Received Radiation - breast 2008 SHADAVIA, DAMPIER ([id number]) [id number] Page 7 of 8 Immunizations Pneumococcal Vaccine: Received Pneumococcal Vaccination: Yes Received Pneumococcal Vaccination On or After 60th Birthday: Yes Implantable Devices None Family and Social History Former smoker; Marital Status - Widowed; Alcohol Use: Moderate; Drug Use: No History; Caffeine Use: Daily Electronic Signature(s) Signed: 03/31/2022 5:16:25 PM By: Worthy Keeler PA-C Signed: 04/01/2022 8:36:51 AM By: Carlene Coria RN Entered By: Carlene Coria on 03/31/2022 08:55:26 -------------------------------------------------------------------------------- SuperBill Details Patient Name: Date of Service: [name] 03/31/2022 Medical  Record Number: [id number] Patient Account Number: [id number] Date of Birth/Sex: Treating RN: 1940/11/23 (81 y.o. 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 I48.0 Paroxysmal atrial fibrillation Z79.01 Long term (current) use of anticoagulants I10 Essential (primary) hypertension N18.30 Chronic  kidney disease, stage 3 unspecified I50.42 Chronic combined systolic (congestive) and diastolic (congestive) heart failure Facility Procedures : CPT4 Code: [id number] Description: 99214 - WOUND CARE VISIT-LEV 4 EST PT Modifier: Quantity: 1 Physician Procedures : CPT4 Code Description Modifier [id number] WC PHYS LEVEL 3 NEW PT ICD-10 Diagnosis Description I87.332 Chronic 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

## 2022-04-01 NOTE — Progress Notes (Signed)
NYCHELLE, CASSATA ([id number]) Visit Report for 03/31/2022 Abuse Risk Screen Details Patient Name: [name], [name]. Date of Service: 03/31/2022 8:45 AM Medical Record Number: [id number] Patient Account Number: [id number] Date of Birth/Sex: 03-04-1941 (81 y.o. F) Treating RN: Carlene Coria Primary Care Dajon Lazar: [name] Other Clinician: Referring Raiyan Dalesandro: [name] Treating Jaykob Minichiello/Extender: Skipper Cliche in Treatment: 0 Abuse Risk Screen Items Answer ABUSE RISK SCREEN: Has anyone close to you tried to hurt or harm you recentlyo No Do you feel uncomfortable with anyone in your familyo No Has anyone forced you do things that you didnot want to doo No Electronic Signature(s) Signed: 04/01/2022 8:36:51 AM By: Carlene Coria RN Entered By: Carlene Coria on 03/31/2022 08:55:32 Printy, Faylinn Chauncey Cruel ([id number]) -------------------------------------------------------------------------------- Activities of Daily Living Details Patient Name: [name]. Date of Service: 03/31/2022 8:45 AM Medical Record Number: [id number] Patient Account Number: [id number] Date of Birth/Sex: 03/02/1941 (81 y.o. F) Treating RN: Carlene Coria Primary Care Ranier Coach: [name] Other Clinician: Referring Taariq Leitz: [name] Treating Sopheap Boehle/Extender: Skipper Cliche in Treatment: 0 Activities of Daily Living Items Answer Activities of Daily Living (Please select one for each item) Drive Automobile Completely Able Take Medications Completely Able Use Telephone Completely Able Care for Appearance Completely Able Use Toilet Completely Able Bath / Shower Completely Able Dress Self Completely Able Feed Self Completely Able Walk Completely Able Get In / Out Bed Completely Able Housework Completely Able Prepare Meals Completely Able Handle Money Completely Able Shop for Self Completely Able Electronic Signature(s) Signed: 04/01/2022 8:36:51 AM By: Carlene Coria  RN Entered By: Carlene Coria on 03/31/2022 08:55:56 [name] ([id number]) -------------------------------------------------------------------------------- Education Screening Details Patient Name: [name]. Date of Service: 03/31/2022 8:45 AM Medical Record Number: [id number] Patient Account Number: [id number] Date of Birth/Sex: 1941-04-19 (81 y.o. F) Treating RN: Carlene Coria Primary Care Miklos Bidinger: [name] Other Clinician: Referring Nailani Full: [name] Treating Eulala Newcombe/Extender: Skipper Cliche in Treatment: 0 Primary Learner Assessed: Patient Learning Preferences/Education Level/Primary Language Learning Preference: Explanation Highest Education Level: College or Above Preferred Language: English Cognitive Barrier Language Barrier: No Translator Needed: No Memory Deficit: No Emotional Barrier: No Cultural/Religious Beliefs Affecting Medical Care: No Physical Barrier Impaired Vision: No Impaired Hearing: No Decreased Hand dexterity: No Knowledge/Comprehension Knowledge Level: High Comprehension Level: High Ability to understand written instructions: High Ability to understand verbal instructions: High Motivation Anxiety Level: Anxious Cooperation: Cooperative Education Importance: Acknowledges Need Interest in Health Problems: Asks Questions Perception: Coherent Willingness to Engage in Self-Management High Activities: Readiness to Engage in Self-Management High Activities: Electronic Signature(s) Signed: 04/01/2022 8:36:51 AM By: Carlene Coria RN Entered By: Carlene Coria on 03/31/2022 08:56:25 MCKYNLIE, VANDERSLICE ([id number]) -------------------------------------------------------------------------------- Fall Risk Assessment Details Patient Name: [name]. Date of Service: 03/31/2022 8:45 AM Medical Record Number: [id number] Patient Account Number: [id number] Date of Birth/Sex: 04-30-1941 (81 y.o. F) Treating RN:  Carlene Coria Primary Care Bentleigh Stankus: [name] Other Clinician: Referring Marche Hottenstein: [name] Treating Romano Stigger/Extender: Skipper Cliche in Treatment: 0 Fall Risk Assessment Items Have you had 2 or more falls in the last 12 monthso 0 Yes Have you had any fall that resulted in injury in the last 12 monthso 0 Yes FALLS RISK SCREEN History of falling - immediate or within 3 months 0 No Secondary diagnosis (Do you have 2 or more medical diagnoseso) 0 No Ambulatory aid None/bed rest/wheelchair/nurse 0 No Crutches/cane/walker 0 No Furniture 0 No Intravenous therapy Access/Saline/Heparin Lock 0 No Gait/Transferring Normal/ bed rest/ wheelchair 0 No Weak (short steps with  or without shuffle, stooped but able to lift head while walking, may 0 No seek support from furniture) Impaired (short steps with shuffle, may have difficulty arising from chair, head down, impaired 0 No balance) Mental Status Oriented to own ability 0 No Electronic Signature(s) Signed: 04/01/2022 8:36:51 AM By: Carlene Coria RN Entered By: Carlene Coria on 03/31/2022 08:56:54 Schoonmaker, Shonia Chauncey Cruel ([id number]) -------------------------------------------------------------------------------- Foot Assessment Details Patient Name: [name]. Date of Service: 03/31/2022 8:45 AM Medical Record Number: [id number] Patient Account Number: [id number] Date of Birth/Sex: [birth year] (81 y.o. F) Treating RN: Carlene Coria Primary Care Kathe Wirick: [name] Other Clinician: Referring Sathvik Tiedt: [name] Treating Jonae Renshaw/Extender: Skipper Cliche in Treatment: 0 Foot Assessment Items Site Locations + = Sensation present, - = Sensation absent, C = Callus, U = Ulcer R = Redness, W = Warmth, M = Maceration, PU = Pre-ulcerative lesion F = Fissure, S = Swelling, D = Dryness Assessment Right: Left: Other Deformity: No No Prior Foot Ulcer: No No Prior Amputation: No No Charcot Joint: No  No Ambulatory Status: Ambulatory Without Help Gait: Steady Electronic Signature(s) Signed: 04/01/2022 8:36:51 AM By: Carlene Coria RN Entered By: Carlene Coria on 03/31/2022 09:05:21 [name] ([id number]) -------------------------------------------------------------------------------- Nutrition Risk Screening Details Patient Name: [name]. Date of Service: 03/31/2022 8:45 AM Medical Record Number: [id number] Patient Account Number: [id number] Date of Birth/Sex: 08-Jun-1941 (81 y.o. F) Treating RN: Carlene Coria Primary Care Braylyn Kalter: [name] Other Clinician: Referring Annsleigh Dragoo: [name] Treating Damonta Cossey/Extender: Skipper Cliche in Treatment: 0 Height (in): 65 Weight (lbs): 115 Body Mass Index (BMI): 19.1 Nutrition Risk Screening Items Score Screening NUTRITION RISK SCREEN: I have an illness or condition that made me change the kind and/or amount of food I eat 0 No I eat fewer than two meals per day 0 No I eat few fruits and vegetables, or milk products 0 No I have three or more drinks of beer, liquor or wine almost every day 0 No I have tooth or mouth problems that make it hard for me to eat 0 No I don't always have enough money to buy the food I need 0 No I eat alone most of the time 0 No I take three or more different prescribed or over-the-counter drugs a day 1 Yes Without wanting to, I have lost or gained 10 pounds in the last six months 0 No I am not always physically able to shop, cook and/or feed myself 0 No Nutrition Protocols Good Risk Protocol 0 No interventions needed Moderate Risk Protocol High Risk Proctocol Risk Level: Good Risk Score: 1 Electronic Signature(s) Signed: 04/01/2022 8:36:51 AM By: Carlene Coria RN Entered By: Carlene Coria on 03/31/2022 08:57:05

## 2022-04-02 ENCOUNTER — Ambulatory Visit (INDEPENDENT_AMBULATORY_CARE_PROVIDER_SITE_OTHER): Payer: Medicare Other | Admitting: Podiatry

## 2022-04-02 ENCOUNTER — Encounter: Payer: Self-pay | Admitting: Podiatry

## 2022-04-02 DIAGNOSIS — M79676 Pain in unspecified toe(s): Secondary | ICD-10-CM

## 2022-04-02 DIAGNOSIS — I2510 Atherosclerotic heart disease of native coronary artery without angina pectoris: Secondary | ICD-10-CM | POA: Diagnosis not present

## 2022-04-02 DIAGNOSIS — I2584 Coronary atherosclerosis due to calcified coronary lesion: Secondary | ICD-10-CM | POA: Diagnosis not present

## 2022-04-02 DIAGNOSIS — M2041 Other hammer toe(s) (acquired), right foot: Secondary | ICD-10-CM | POA: Diagnosis not present

## 2022-04-02 DIAGNOSIS — D689 Coagulation defect, unspecified: Secondary | ICD-10-CM

## 2022-04-02 DIAGNOSIS — B351 Tinea unguium: Secondary | ICD-10-CM

## 2022-04-02 DIAGNOSIS — I872 Venous insufficiency (chronic) (peripheral): Secondary | ICD-10-CM

## 2022-04-02 DIAGNOSIS — M2042 Other hammer toe(s) (acquired), left foot: Secondary | ICD-10-CM | POA: Diagnosis not present

## 2022-04-02 NOTE — Progress Notes (Signed)
This patient returns to my office for at risk foot care.  This patient requires this care by a professional since this patient will be at risk due to having lymphedema.  This patient is unable to cut nails herself since the patient cannot reach her nails.These nails are painful walking and wearing shoes.  This patient presents for at risk foot care today.  General Appearance  Alert, conversant and in no acute stress.  Vascular  Dorsalis pedis and posterior tibial  pulses are palpable  bilaterally.  Capillary return is within normal limits  bilaterally. Temperature is within normal limits  bilaterally.  Neurologic  Senn-Weinstein monofilament wire test within normal limits  bilaterally. Muscle power within normal limits bilaterally.  Nails Thick disfigured discolored nails with subungual debris  from hallux to fifth toes bilaterally. No evidence of bacterial infection or drainage bilaterally.  Orthopedic  No limitations of motion  feet .  No crepitus or effusions noted.  No bony pathology or digital deformities noted.  HAV  B/L.  Mallet toe third toe left foot.  Skin  normotropic skin with no porokeratosis noted bilaterally.  No signs of infections or ulcers noted.     Onychomycosis  Pain in right toes  Pain in left toes  Consent was obtained for treatment procedures.   Mechanical debridement of nails 1-5  bilaterally performed with a nail nipper.  Filed with dremel without incident.  Discussed third toe left with patient and dispensed leather crest pad.   Return office visit    3 months                  Told patient to return for periodic foot care and evaluation due to potential at risk complications.   Gardiner Barefoot DPM

## 2022-04-10 ENCOUNTER — Encounter: Payer: Self-pay | Admitting: *Deleted

## 2022-04-10 ENCOUNTER — Telehealth: Payer: Self-pay | Admitting: Internal Medicine

## 2022-04-10 NOTE — Telephone Encounter (Signed)
Patient c/o Palpitations:  High priority if patient c/o lightheadedness, shortness of breath, or chest pain  How long have you had palpitations/irregular HR/ Afib? Are you having the symptoms now? This morning her watch just woke her up, yes  Are you currently experiencing lightheadedness, SOB or CP? no  Do you have a history of afib (atrial fibrillation) or irregular heart rhythm? yes  Have you checked your BP or HR? (document readings if available): has not yet  Are you experiencing any other symptoms? Just feels her HR   Patient states she was told the next time she gets afib she needed to wait before doing anything, but she is not sure how long to wait and what to do after waiting. She says she was just woken up by her watch telling her she is in afib.

## 2022-04-10 NOTE — Telephone Encounter (Signed)
Called patient has she is back in [facility] at this time. Offered earlier procedure date of Oct 13 and pre [facility]. Will mail instruction to the patient per request.

## 2022-04-10 NOTE — Telephone Encounter (Signed)
Spoke with pt who states she received an alert from her Apple Watch that she is in Afib with HR of 124 earlier this morning.  Pt reports current BP 120/81 with HR-98.  She denies current CP, SOB or dizziness.  Pt advised to dismiss her previous Afib alert on watch and continue monitoring for new alerts.  Pt does not believe her watch has the capability of an EKG.  Pt states she is taking medications as prescribed.  Pt is scheduled for Afib ablation 11/23 with Dr Quentin Ore. Will forward to Dr Quentin Ore for review and further recommendation.  Reviewed ED precautions.  Pt verbalizes understanding and agrees with current plan.

## 2022-04-14 ENCOUNTER — Encounter: Payer: Medicare Other | Admitting: Physician Assistant

## 2022-04-14 DIAGNOSIS — I480 Paroxysmal atrial fibrillation: Secondary | ICD-10-CM | POA: Diagnosis not present

## 2022-04-14 DIAGNOSIS — N183 Chronic kidney disease, stage 3 unspecified: Secondary | ICD-10-CM | POA: Diagnosis not present

## 2022-04-14 DIAGNOSIS — I130 Hypertensive heart and chronic kidney disease with heart failure and stage 1 through stage 4 chronic kidney disease, or unspecified chronic kidney disease: Secondary | ICD-10-CM | POA: Diagnosis not present

## 2022-04-14 DIAGNOSIS — I5042 Chronic combined systolic (congestive) and diastolic (congestive) heart failure: Secondary | ICD-10-CM | POA: Diagnosis not present

## 2022-04-14 DIAGNOSIS — I87322 Chronic venous hypertension (idiopathic) with inflammation of left lower extremity: Secondary | ICD-10-CM | POA: Diagnosis not present

## 2022-04-14 DIAGNOSIS — I87332 Chronic venous hypertension (idiopathic) with ulcer and inflammation of left lower extremity: Secondary | ICD-10-CM | POA: Diagnosis not present

## 2022-04-14 DIAGNOSIS — L97322 Non-pressure chronic ulcer of left ankle with fat layer exposed: Secondary | ICD-10-CM | POA: Diagnosis not present

## 2022-04-14 NOTE — Progress Notes (Signed)
NIKAYLA, MADARIS ([id number]) Visit Report for 04/14/2022 Chief Complaint Document Details Patient Name: [name], [name]. Date of Service: 04/14/2022 8:00 AM Medical Record Number: [id number] Patient Account Number: [id number] Date of Birth/Sex: [birth year] (81 y.o. F) Treating RN: Carlene Coria Primary Care Provider: [name] Other Clinician: Referring Provider: [name] Treating Provider/Extender: Skipper Cliche in Treatment: 2 Information Obtained from: Patient Chief Complaint Left medial ankle ulcer Electronic Signature(s) Signed: 04/14/2022 8:20:02 AM By: Worthy Keeler PA-C Entered By: Worthy Keeler on 04/14/2022 08:20:02 RANESHIA, DERICK ([id number]) -------------------------------------------------------------------------------- Problem List Details Patient Name: [name] Date of Service: 04/14/2022 8:00 AM Medical Record Number: [id number] Patient Account Number: [id number] Date of Birth/Sex: 1940/08/12 (81 y.o. 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 03/31/2022 No Yes left lower extremity L97.322 Non-pressure chronic ulcer of left ankle with fat layer exposed 03/31/2022 No Yes I48.0 Paroxysmal atrial fibrillation 03/31/2022 No Yes Z79.01 Long term (current) use of anticoagulants 03/31/2022 No Yes I10 Essential (primary) hypertension 03/31/2022 No Yes N18.30 Chronic kidney disease, stage 3 unspecified 03/31/2022 No Yes 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

## 2022-04-15 ENCOUNTER — Telehealth: Payer: Self-pay | Admitting: Internal Medicine

## 2022-04-15 DIAGNOSIS — I4819 Other persistent atrial fibrillation: Secondary | ICD-10-CM

## 2022-04-15 NOTE — Telephone Encounter (Signed)
Patient has not gotten instructions yet in the mail. Will call back 9/28 with the patient.

## 2022-04-15 NOTE — Telephone Encounter (Signed)
Lab orders have been placed and patient is aware. She states that she will go back over there this afternoon. She also would like to know when she is going to receive her procedure instructions. I advised her that they were put in the mail last and she should be getting them within the next several days but that I could not guarantee an exact date. She states that she would like to talk with Otila Kluver to discuss instructions. I advised her that if she has questions once she receives her letter that she should give [place] a call. Patient would prefer that [facility] call her tomorrow.

## 2022-04-15 NOTE — Telephone Encounter (Signed)
Patient showed up to have labs done but there is no order in there for that. Please advise

## 2022-04-16 ENCOUNTER — Other Ambulatory Visit
Admission: RE | Admit: 2022-04-16 | Discharge: 2022-04-16 | Disposition: A | Discharge status: Home / Self Care | Payer: Medicare Other | Attending: Cardiology | Admitting: Cardiology

## 2022-04-16 DIAGNOSIS — I4819 Other persistent atrial fibrillation: Secondary | ICD-10-CM | POA: Insufficient documentation

## 2022-04-16 LAB — CBC
HCT: 36.5 % (ref 36.0–46.0)
Hemoglobin: 11.9 g/dL — ABNORMAL LOW (ref 12.0–15.0)
MCH: 33.5 pg (ref 26.0–34.0)
MCHC: 32.6 g/dL (ref 30.0–36.0)
MCV: 102.8 fL — ABNORMAL HIGH (ref 80.0–100.0)
Platelets: 267 10*3/uL (ref 150–400)
RBC: 3.55 MIL/uL — ABNORMAL LOW (ref 3.87–5.11)
RDW: 14.9 % (ref 11.5–15.5)
WBC: 5 10*3/uL (ref 4.0–10.5)
nRBC: 0 % (ref 0.0–0.2)

## 2022-04-16 LAB — BASIC METABOLIC PANEL
Anion gap: 10 (ref 5–15)
BUN: 25 mg/dL — ABNORMAL HIGH (ref 8–23)
CO2: 28 mmol/L (ref 22–32)
Calcium: 9.3 mg/dL (ref 8.9–10.3)
Chloride: 100 mmol/L (ref 98–111)
Creatinine, Ser: 1.06 mg/dL — ABNORMAL HIGH (ref 0.44–1.00)
GFR, Estimated: 52 mL/min — ABNORMAL LOW (ref >60)
Glucose, Bld: 105 mg/dL — ABNORMAL HIGH (ref 70–99)
Potassium: 4.1 mmol/L (ref 3.5–5.1)
Sodium: 138 mmol/L (ref 135–145)

## 2022-04-16 NOTE — Progress Notes (Signed)
[name] ([id number]) Visit Report for 04/14/2022 Arrival Information Details Patient Name: [name], [name]. Date of Service: 04/14/2022 8:00 AM Medical Record Number: [id number] Patient Account Number: [id number] Date of Birth/Sex: 22-May-1941 (81 y.o. F) Treating RN: Carlene Coria Primary Care Treshawn Allen: [name] Other Clinician: Referring Jauan Wohl: [name] Treating Dellamae Rosamilia/Extender: Skipper Cliche in Treatment: 2 Visit Information History Since Last Visit All ordered tests and consults were completed: No Patient Arrived: Ambulatory Added or deleted any medications: No Arrival Time: 08:02 Any new allergies or adverse reactions: No Accompanied By: self Had a fall or experienced change in No Transfer Assistance: None activities of daily living that may affect Patient Identification Verified: Yes risk of falls: Secondary Verification Process Completed: Yes Signs or symptoms of abuse/neglect since last visito No Patient Requires Transmission-Based Precautions: No Hospitalized since last visit: No Patient Has Alerts: No Implantable device outside of the clinic excluding No cellular tissue based products placed in the center since last visit: Has Dressing in Place as Prescribed: Yes Has Compression in Place as Prescribed: Yes Pain Present Now: No Electronic Signature(s) Signed: 04/16/2022 12:48:30 PM By: Carlene Coria RN Entered By: Carlene Coria on 04/14/2022 08:05:41 Nasser, Terisa Starr ([id number]) -------------------------------------------------------------------------------- Clinic Level of Care Assessment Details Patient Name: [name]. Date of Service: 04/14/2022 8:00 AM Medical Record Number: [id number] Patient Account Number: [id number] Date of Birth/Sex: 09/06/1940 (81 y.o. F) Treating RN: Carlene Coria Primary Care Mashonda Broski: [name] Other Clinician: Referring Maico Mulvehill: [name] Treating Adilee Lemme/Extender: Skipper Cliche in Treatment: 2 Clinic Level of Care Assessment Items TOOL 4 Quantity Score X - Use when only an EandM is performed on FOLLOW-UP visit 1 0 ASSESSMENTS - Nursing Assessment / Reassessment X - Reassessment of Co-morbidities (includes updates in patient status) 1 10 X- 1 5 Reassessment of Adherence to Treatment Plan ASSESSMENTS - Wound and Skin Assessment / Reassessment X - Simple Wound Assessment / Reassessment - one wound 1 5 '[]'$  - 0 Complex Wound Assessment / Reassessment - multiple wounds '[]'$  - 0 Dermatologic / Skin Assessment (not related to wound area) ASSESSMENTS - Focused Assessment '[]'$  - Circumferential Edema Measurements - multi extremities 0 '[]'$  - 0 Nutritional Assessment / Counseling / Intervention '[]'$  - 0 Lower Extremity Assessment (monofilament, tuning fork, pulses) '[]'$  - 0 Peripheral Arterial Disease Assessment (using hand held doppler) ASSESSMENTS - Ostomy and/or Continence Assessment and Care '[]'$  - Incontinence Assessment and Management 0 '[]'$  - 0 Ostomy Care Assessment and Management (repouching, etc.) PROCESS - Coordination of Care X - Simple Patient / Family Education for ongoing care 1 15 '[]'$  - 0 Complex (extensive) Patient / Family Education for ongoing care '[]'$  - 0 Staff obtains Programmer, systems, Records, Test Results / Process Orders '[]'$  - 0 Staff telephones HHA, Nursing Homes / Clarify orders / etc '[]'$  - 0 Routine Transfer to another Facility (non-emergent condition) '[]'$  - 0 Routine Hospital Admission (non-emergent condition) '[]'$  - 0 New Admissions / Biomedical engineer / Ordering NPWT, Apligraf, etc. '[]'$  - 0 Emergency Hospital Admission (emergent condition) X- 1 10 Simple Discharge Coordination '[]'$  - 0 Complex (extensive) Discharge Coordination PROCESS - Special Needs '[]'$  - Pediatric / Minor Patient Management 0 '[]'$  - 0 Isolation Patient Management '[]'$  - 0 Hearing / Language / Visual special needs '[]'$  - 0 Assessment of Community assistance  (transportation, D/C planning, etc.) '[]'$  - 0 Additional assistance / Altered mentation '[]'$  - 0 Support Surface(s) Assessment (bed, cushion, seat, etc.) INTERVENTIONS - Wound Cleansing / Measurement Krol, Sakara S. ([id number])  X- 1 5 Simple Wound Cleansing - one wound '[]'$  - 0 Complex Wound Cleansing - multiple wounds X- 1 5 Wound Imaging (photographs - any number of wounds) '[]'$  - 0 Wound Tracing (instead of photographs) X- 1 5 Simple Wound Measurement - one wound '[]'$  - 0 Complex Wound Measurement - multiple wounds INTERVENTIONS - Wound Dressings X - Small Wound Dressing one or multiple wounds 1 10 '[]'$  - 0 Medium Wound Dressing one or multiple wounds '[]'$  - 0 Large Wound Dressing one or multiple wounds '[]'$  - 0 Application of Medications - topical '[]'$  - 0 Application of Medications - injection INTERVENTIONS - Miscellaneous '[]'$  - External ear exam 0 '[]'$  - 0 Specimen Collection (cultures, biopsies, blood, body fluids, etc.) '[]'$  - 0 Specimen(s) / Culture(s) sent or taken to Lab for analysis '[]'$  - 0 Patient Transfer (multiple staff / Civil Service fast streamer / Similar devices) '[]'$  - 0 Simple Staple / Suture removal (25 or less) '[]'$  - 0 Complex Staple / Suture removal (26 or more) '[]'$  - 0 Hypo / Hyperglycemic Management (close monitor of Blood Glucose) '[]'$  - 0 Ankle / Brachial Index (ABI) - do not check if billed separately X- 1 5 Vital Signs Has the patient been seen at the hospital within the last three years: Yes Total Score: 75 Level Of Care: New/Established - Level 2 Electronic Signature(s) Signed: 04/16/2022 12:48:30 PM By: Carlene Coria RN Entered By: Carlene Coria on 04/14/2022 08:24:47 Vanwingerden, Terisa Starr ([id number]) -------------------------------------------------------------------------------- Encounter Discharge Information Details Patient Name: [name]. Date of Service: 04/14/2022 8:00 AM Medical Record Number: [id number] Patient Account Number: [id number] Date of  Birth/Sex: 01/11/1941 (81 y.o. F) Treating RN: Carlene Coria Primary Care Brissia Delisa: [name] Other Clinician: Referring Jovon Streetman: [name] Treating Areli Frary/Extender: Skipper Cliche in Treatment: 2 Encounter Discharge Information Items Discharge Condition: Stable Ambulatory Status: Ambulatory Discharge Destination: Home Transportation: Private Auto Accompanied By: self Schedule Follow-up Appointment: Yes Clinical Summary of Care: Electronic Signature(s) Signed: 04/16/2022 12:48:30 PM By: Carlene Coria RN Entered By: Carlene Coria on 04/14/2022 08:25:30 [name] ([id number]) -------------------------------------------------------------------------------- Lower Extremity Assessment Details Patient Name: [name]. Date of Service: 04/14/2022 8:00 AM Medical Record Number: [id number] Patient Account Number: [id number] Date of Birth/Sex: 1941-04-06 (81 y.o. F) Treating RN: Carlene Coria Primary Care Elius Etheredge: [name] Other Clinician: Referring Yoceline Bazar: [name] Treating Priyal Musquiz/Extender: Skipper Cliche in Treatment: 2 Edema Assessment Assessed: [Left: No] [Right: No] Edema: [Left: N] [Right: o] Calf Left: Right: Point of Measurement: 32 cm From Medial Instep 27 cm Ankle Left: Right: Point of Measurement: 10 cm From Medial Instep 18 cm Vascular Assessment Pulses: Dorsalis Pedis Palpable: [Left:Yes] Electronic Signature(s) Signed: 04/16/2022 12:48:30 PM By: Carlene Coria RN Entered By: Carlene Coria on 04/14/2022 08:08:44 Trimble, Tyshawna Chauncey Cruel ([id number]) -------------------------------------------------------------------------------- Multi Wound Chart Details Patient Name: [name]. Date of Service: 04/14/2022 8:00 AM Medical Record Number: [id number] Patient Account Number: [id number] Date of Birth/Sex: 1940/11/01 (81 y.o. F) Treating RN: Carlene Coria Primary Care Dontarius Sheley: [name] Other  Clinician: Referring Danaiya Steadman: [name] Treating Rollins Wrightson/Extender: Skipper Cliche in Treatment: 2 Vital Signs Height(in): 65 Pulse(bpm): 59 Weight(lbs): 115 Blood Pressure(mmHg): 142/86 Body Mass Index(BMI): 19.1 Temperature(F): 97.7 Respiratory Rate(breaths/min): 18 Photos: [N/A:N/A] Wound Location: Left, Medial Ankle N/A N/A Wounding Event: Gradually Appeared N/A N/A Primary Etiology: Venous Leg Ulcer N/A N/A Comorbid History: Arrhythmia, Congestive Heart N/A N/A Failure, Hypertension, Received Radiation Date Acquired: 08/20/2021 N/A N/A Weeks of Treatment: 2 N/A N/A Wound Status: Open N/A N/A Wound Recurrence:  No N/A N/A Measurements L x W x D (cm) 0x0x0 N/A N/A Area (cm) : 0 N/A N/A Volume (cm) : 0 N/A N/A % Reduction in Area: 100.00% N/A N/A % Reduction in Volume: 100.00% N/A N/A Classification: Full Thickness Without Exposed N/A N/A Support Structures Exudate Amount: None Present N/A N/A Wound Margin: Flat and Intact N/A N/A Granulation Amount: None Present (0%) N/A N/A Necrotic Amount: None Present (0%) N/A N/A Exposed Structures: Fascia: No N/A N/A Fat Layer (Subcutaneous Tissue): No Tendon: No Muscle: No Joint: No Bone: No Epithelialization: Large (67-100%) N/A N/A Treatment Notes Electronic Signature(s) Signed: 04/16/2022 12:48:30 PM By: Carlene Coria RN Entered By: Carlene Coria on 04/14/2022 08:23:15 [name] ([id number]) -------------------------------------------------------------------------------- [place] Details Patient Name: [name]. Date of Service: 04/14/2022 8:00 AM Medical Record Number: [id number] Patient Account Number: [id number] Date of Birth/Sex: 1941-04-27 (81 y.o. F) Treating RN: Carlene Coria Primary Care Danyon Mcginness: [name] Other Clinician: Referring Atthew Coutant: [name] Treating Braedyn Riggle/Extender: Skipper Cliche in Treatment: 2 Active Inactive Electronic  Signature(s) Signed: 04/16/2022 12:48:30 PM By: Carlene Coria RN Entered By: Carlene Coria on 04/14/2022 08:22:57 [name] ([id number]) -------------------------------------------------------------------------------- Pain Assessment Details Patient Name: [name]. Date of Service: 04/14/2022 8:00 AM Medical Record Number: [id number] Patient Account Number: [id number] Date of Birth/Sex: 24-Apr-1941 (81 y.o. F) Treating RN: Carlene Coria Primary Care Xadrian Craighead: [name] Other Clinician: Referring Briahnna Harries: [name] Treating Jamilett Ferrante/Extender: Skipper Cliche in Treatment: 2 Active Problems Location of Pain Severity and Description of Pain Patient Has Paino No Site Locations Pain Management and Medication Current Pain Management: Electronic Signature(s) Signed: 04/16/2022 12:48:30 PM By: Carlene Coria RN Entered By: Carlene Coria on 04/14/2022 08:07:40 [name] ([id number]) -------------------------------------------------------------------------------- Patient/Caregiver Education Details Patient Name: [name] Date of Service: 04/14/2022 8:00 AM Medical Record Number: [id number] Patient Account Number: [id number] Date of Birth/Gender: [birth year] (81 y.o. F) Treating RN: Carlene Coria Primary Care Physician: [name] Other Clinician: Referring Physician: [name] Treating Physician/Extender: Skipper Cliche in Treatment: 2 Education Assessment Education Provided To: Patient Education Topics Provided Wound/Skin Impairment: Methods: Explain/Verbal Responses: State content correctly Electronic Signature(s) Signed: 04/16/2022 12:48:30 PM By: Carlene Coria RN Entered By: Carlene Coria on 04/14/2022 08:25:06 [name] ([id number]) -------------------------------------------------------------------------------- Wound Assessment Details Patient Name: [name]. Date of Service: 04/14/2022 8:00  AM Medical Record Number: [id number] Patient Account Number: [id number] Date of Birth/Sex: [birth year] (81 y.o. F) Treating RN: Carlene Coria Primary Care Raymondo Garcialopez: [name] Other Clinician: Referring Lillyann Ahart: [name] Treating Ainara Eldridge/Extender: Skipper Cliche in Treatment: 2 Wound Status Wound Number: 1 Primary Venous Leg Ulcer Etiology: Wound Location: Left, Medial Ankle Wound Status: Open Wounding Event: Gradually Appeared Comorbid Arrhythmia, Congestive Heart Failure, Hypertension, Date Acquired: 08/20/2021 History: Received Radiation Weeks Of Treatment: 2 Clustered Wound: No Photos Wound Measurements Length: (cm) 0 Width: (cm) 0 Depth: (cm) 0 Area: (cm) 0 Volume: (cm) 0 % Reduction in Area: 100% % Reduction in Volume: 100% Epithelialization: Large (67-100%) Tunneling: No Undermining: No Wound Description Classification: Full Thickness Without Exposed Support Structure Wound Margin: Flat and Intact Exudate Amount: None Present s Foul Odor After Cleansing: No Slough/Fibrino No Wound Bed Granulation Amount: None Present (0%) Exposed Structure Necrotic Amount: None Present (0%) Fascia Exposed: No Fat Layer (Subcutaneous Tissue) Exposed: No Tendon Exposed: No Muscle Exposed: No Joint Exposed: No Bone Exposed: No Electronic Signature(s) Signed: 04/16/2022 12:48:30 PM By: Carlene Coria RN Entered By: Carlene Coria on 04/14/2022 08:08:04 [name] ([id number]) --------------------------------------------------------------------------------  Vitals Details Patient Name: JALISHA, ENNEKING. Date of Service: 04/14/2022 8:00 AM Medical Record Number: [id number] Patient Account Number: [id number] Date of Birth/Sex: [birth year] (81 y.o. F) Treating RN: Carlene Coria Primary Care Clois Montavon: [name] Other Clinician: Referring Nakhia Levitan: [name] Treating Gilmore List/Extender: Skipper Cliche in Treatment: 2 Vital Signs Time Taken:  08:05 Temperature (F): 97.7 Height (in): 65 Pulse (bpm): 85 Weight (lbs): 115 Respiratory Rate (breaths/min): 18 Body Mass Index (BMI): 19.1 Blood Pressure (mmHg): 142/86 Reference Range: 80 - 120 mg / dl Electronic Signature(s) Signed: 04/16/2022 12:48:30 PM By: Carlene Coria RN Entered By: Carlene Coria on 04/14/2022 08:05:57

## 2022-04-16 NOTE — Telephone Encounter (Signed)
Left message with the patient checking on her instructions and advised to call back if she has questions after she reviews them.

## 2022-04-20 NOTE — Telephone Encounter (Addendum)
Patient got instructions in the mail. Answered all questions.  Verbalized understanding.

## 2022-04-27 ENCOUNTER — Telehealth: Payer: Self-pay | Admitting: Cardiology

## 2022-04-27 NOTE — Telephone Encounter (Signed)
Pt c/o medication issue:  1. Name of Medication:   apixaban (ELIQUIS) 2.5 MG TABS tablet    2. How are you currently taking this medication (dosage and times per day)? Take 1 tablet (2.5 mg total) by mouth 2 (two) times daily.  3. Are you having a reaction (difficulty breathing--STAT)? No  4. What is your medication issue? Pt states that she missed dosage of medication last night. She would like a callback regarding this matter being that she has upcoming Ablation on 10/13. Please advise

## 2022-04-29 NOTE — Telephone Encounter (Signed)
Confirmed that Anderson Malta from the Cath lab has set this up.

## 2022-04-29 NOTE — Telephone Encounter (Signed)
Vickie Epley, MD to Me  Sunday Shams, RN     Will need TEE on the table immediately prior to starting ablation. If she arrives in sinus rhythm, can cancel the TEE.  Otila Kluver, can you set this up with Rennis Harding?  Thanks,  Lars Mage  April 27, 2022 Otila Kluver    This patient had a Coronary CT (ordered by Caryl Comes) on 8/10 that was being used for ablation. Missed Eliquis 10/8 PM dose. Ablation scheduled 10/13. Okay to proceed?

## 2022-04-30 NOTE — Pre-Procedure Instructions (Signed)
Instructed patient on the following items: Arrival time 0830 Nothing to eat or drink after midnight No meds AM of procedure Responsible person to drive you home and stay with you for 24 hrs  Have you missed any doses of anti-coagulant Eliquis- missed dose on 10/8, EKG on arrival

## 2022-05-01 ENCOUNTER — Ambulatory Visit (HOSPITAL_COMMUNITY): Payer: Medicare Other | Admitting: Anesthesiology

## 2022-05-01 ENCOUNTER — Encounter (HOSPITAL_COMMUNITY)
Admission: RE | Disposition: A | Discharge status: Home / Self Care | Payer: Medicare Other | Source: Home / Self Care | Attending: Cardiology

## 2022-05-01 ENCOUNTER — Ambulatory Visit (HOSPITAL_BASED_OUTPATIENT_CLINIC_OR_DEPARTMENT_OTHER): Payer: Medicare Other | Admitting: Anesthesiology

## 2022-05-01 ENCOUNTER — Other Ambulatory Visit: Payer: Self-pay

## 2022-05-01 ENCOUNTER — Ambulatory Visit (HOSPITAL_COMMUNITY)
Admission: RE | Admit: 2022-05-01 | Discharge: 2022-05-01 | Disposition: A | Discharge status: Home / Self Care | Payer: Medicare Other | Attending: Cardiology | Admitting: Cardiology

## 2022-05-01 ENCOUNTER — Ambulatory Visit (HOSPITAL_COMMUNITY): Admission: RE | Admit: 2022-05-01 | Payer: Medicare Other | Source: Ambulatory Visit

## 2022-05-01 ENCOUNTER — Other Ambulatory Visit (HOSPITAL_COMMUNITY): Payer: Self-pay

## 2022-05-01 DIAGNOSIS — I4819 Other persistent atrial fibrillation: Secondary | ICD-10-CM | POA: Diagnosis not present

## 2022-05-01 DIAGNOSIS — Z87891 Personal history of nicotine dependence: Secondary | ICD-10-CM | POA: Insufficient documentation

## 2022-05-01 DIAGNOSIS — I4891 Unspecified atrial fibrillation: Secondary | ICD-10-CM

## 2022-05-01 DIAGNOSIS — I480 Paroxysmal atrial fibrillation: Secondary | ICD-10-CM

## 2022-05-01 DIAGNOSIS — I110 Hypertensive heart disease with heart failure: Secondary | ICD-10-CM | POA: Insufficient documentation

## 2022-05-01 DIAGNOSIS — I5032 Chronic diastolic (congestive) heart failure: Secondary | ICD-10-CM | POA: Insufficient documentation

## 2022-05-01 DIAGNOSIS — F1090 Alcohol use, unspecified, uncomplicated: Secondary | ICD-10-CM | POA: Diagnosis not present

## 2022-05-01 DIAGNOSIS — Z7901 Long term (current) use of anticoagulants: Secondary | ICD-10-CM | POA: Insufficient documentation

## 2022-05-01 DIAGNOSIS — Z79899 Other long term (current) drug therapy: Secondary | ICD-10-CM | POA: Diagnosis not present

## 2022-05-01 DIAGNOSIS — I509 Heart failure, unspecified: Secondary | ICD-10-CM | POA: Diagnosis not present

## 2022-05-01 HISTORY — PX: ATRIAL FIBRILLATION ABLATION: EP1191

## 2022-05-01 LAB — POCT ACTIVATED CLOTTING TIME
Activated Clotting Time: 281 seconds
Activated Clotting Time: 335 seconds

## 2022-05-01 SURGERY — ATRIAL FIBRILLATION ABLATION
Anesthesia: General

## 2022-05-01 MED ORDER — FENTANYL CITRATE (PF) 100 MCG/2ML IJ SOLN
INTRAMUSCULAR | Status: DC | PRN
  Administered 2022-05-01: 50 ug via INTRAVENOUS

## 2022-05-01 MED ORDER — HEPARIN (PORCINE) IN NACL 1000-0.9 UT/500ML-% IV SOLN
INTRAVENOUS | Status: AC
  Filled 2022-05-01: qty 1500

## 2022-05-01 MED ORDER — HEPARIN (PORCINE) IN NACL 1000-0.9 UT/500ML-% IV SOLN
INTRAVENOUS | Status: DC | PRN
  Administered 2022-05-01 (×3): 500 mL

## 2022-05-01 MED ORDER — PHENYLEPHRINE HCL-NACL 20-0.9 MG/250ML-% IV SOLN
INTRAVENOUS | Status: DC | PRN
  Administered 2022-05-01: 30 ug/min via INTRAVENOUS

## 2022-05-01 MED ORDER — HEPARIN SODIUM (PORCINE) 1000 UNIT/ML IJ SOLN
INTRAMUSCULAR | Status: DC | PRN
  Administered 2022-05-01: 8000 [IU] via INTRAVENOUS
  Administered 2022-05-01: 2000 [IU] via INTRAVENOUS
  Administered 2022-05-01: 4000 [IU] via INTRAVENOUS

## 2022-05-01 MED ORDER — APIXABAN 2.5 MG PO TABS
2.5000 mg | ORAL_TABLET | Freq: Two times a day (BID) | ORAL | Status: DC
  Administered 2022-05-01: 2.5 mg via ORAL
  Filled 2022-05-01: qty 1

## 2022-05-01 MED ORDER — PROTAMINE SULFATE 10 MG/ML IV SOLN
INTRAVENOUS | Status: DC | PRN
  Administered 2022-05-01 (×2): 10 mg via INTRAVENOUS
  Administered 2022-05-01: 15 mg via INTRAVENOUS

## 2022-05-01 MED ORDER — SODIUM CHLORIDE 0.9% FLUSH: 3.0000 mL | Freq: Two times a day (BID) | INTRAVENOUS | Status: DC

## 2022-05-01 MED ORDER — ACETAMINOPHEN 325 MG PO TABS: 650.0000 mg | ORAL_TABLET | ORAL | Status: DC | PRN

## 2022-05-01 MED ORDER — SODIUM CHLORIDE 0.9 % IV SOLN: 250.0000 mL | INTRAVENOUS | Status: DC | PRN

## 2022-05-01 MED ORDER — SUGAMMADEX SODIUM 200 MG/2ML IV SOLN
INTRAVENOUS | Status: DC | PRN
  Administered 2022-05-01: 50 mg via INTRAVENOUS
  Administered 2022-05-01: 100 mg via INTRAVENOUS

## 2022-05-01 MED ORDER — LIDOCAINE 2% (20 MG/ML) 5 ML SYRINGE
INTRAMUSCULAR | Status: DC | PRN
  Administered 2022-05-01: 100 mg via INTRAVENOUS

## 2022-05-01 MED ORDER — HEPARIN SODIUM (PORCINE) 1000 UNIT/ML IJ SOLN
INTRAMUSCULAR | Status: AC
  Filled 2022-05-01: qty 10

## 2022-05-01 MED ORDER — SUCCINYLCHOLINE CHLORIDE 200 MG/10ML IV SOSY
PREFILLED_SYRINGE | INTRAVENOUS | Status: DC | PRN
  Administered 2022-05-01: 140 mg via INTRAVENOUS

## 2022-05-01 MED ORDER — ONDANSETRON HCL 4 MG/2ML IJ SOLN
INTRAMUSCULAR | Status: DC | PRN
  Administered 2022-05-01: 4 mg via INTRAVENOUS

## 2022-05-01 MED ORDER — ONDANSETRON HCL 4 MG/2ML IJ SOLN: 4.0000 mg | Freq: Four times a day (QID) | INTRAMUSCULAR | Status: DC | PRN

## 2022-05-01 MED ORDER — SODIUM CHLORIDE 0.9 % IV SOLN: INTRAVENOUS | Status: DC

## 2022-05-01 MED ORDER — SODIUM CHLORIDE 0.9% FLUSH: 3.0000 mL | INTRAVENOUS | Status: DC | PRN

## 2022-05-01 MED ORDER — COLCHICINE 0.6 MG PO TABS
0.6000 mg | ORAL_TABLET | Freq: Two times a day (BID) | ORAL | Status: DC
  Administered 2022-05-01: 0.6 mg via ORAL
  Filled 2022-05-01: qty 1

## 2022-05-01 MED ORDER — PROPOFOL 10 MG/ML IV BOLUS
INTRAVENOUS | Status: DC | PRN
  Administered 2022-05-01: 110 mg via INTRAVENOUS

## 2022-05-01 MED ORDER — DEXAMETHASONE SODIUM PHOSPHATE 10 MG/ML IJ SOLN
INTRAMUSCULAR | Status: DC | PRN
  Administered 2022-05-01: 10 mg via INTRAVENOUS

## 2022-05-01 MED ORDER — ROCURONIUM BROMIDE 10 MG/ML (PF) SYRINGE
PREFILLED_SYRINGE | INTRAVENOUS | Status: DC | PRN
  Administered 2022-05-01: 30 mg via INTRAVENOUS

## 2022-05-01 MED ORDER — HEPARIN SODIUM (PORCINE) 1000 UNIT/ML IJ SOLN
INTRAMUSCULAR | Status: DC | PRN
  Administered 2022-05-01: 1000 [IU] via INTRAVENOUS

## 2022-05-01 MED ORDER — COLCHICINE 0.6 MG PO TABS
0.6000 mg | ORAL_TABLET | Freq: Two times a day (BID) | ORAL | 0 refills | Status: DC
  Filled 2022-05-01: qty 10, 5d supply, fill #0

## 2022-05-01 MED ORDER — PANTOPRAZOLE SODIUM 40 MG PO TBEC
40.0000 mg | DELAYED_RELEASE_TABLET | Freq: Every day | ORAL | Status: DC
  Administered 2022-05-01: 40 mg via ORAL
  Filled 2022-05-01: qty 1

## 2022-05-01 MED ORDER — PANTOPRAZOLE SODIUM 40 MG PO TBEC
40.0000 mg | DELAYED_RELEASE_TABLET | Freq: Every day | ORAL | 0 refills | Status: DC
  Filled 2022-05-01: qty 45, 45d supply, fill #0

## 2022-05-01 SURGICAL SUPPLY — 19 items
BAG SNAP BAND KOVER 36X36 (MISCELLANEOUS) IMPLANT
CATH 8FR REPROCESSED SOUNDSTAR (CATHETERS) ×1 IMPLANT
CATH 8FR SOUNDSTAR REPROCESSED (CATHETERS) IMPLANT
CATH ABLAT QDOT MICRO BI TC DF (CATHETERS) IMPLANT
CATH OCTARAY 2.0 F 3-3-3-3-3 (CATHETERS) IMPLANT
CATH S-M CIRCA TEMP PROBE (CATHETERS) IMPLANT
CATH WEBSTER BI DIR CS D-F CRV (CATHETERS) IMPLANT
CLOSURE PERCLOSE PROSTYLE (VASCULAR PRODUCTS) IMPLANT
COVER SWIFTLINK CONNECTOR (BAG) ×1 IMPLANT
PACK EP LATEX FREE (CUSTOM PROCEDURE TRAY) ×1
PACK EP LF (CUSTOM PROCEDURE TRAY) ×1 IMPLANT
PAD DEFIB RADIO PHYSIO CONN (PAD) ×1 IMPLANT
PATCH CARTO3 (PAD) IMPLANT
SHEATH BAYLIS TRANSSEPTAL 98CM (NEEDLE) IMPLANT
SHEATH CARTO VIZIGO SM CVD (SHEATH) IMPLANT
SHEATH PINNACLE 8F 10CM (SHEATH) IMPLANT
SHEATH PINNACLE 9F 10CM (SHEATH) IMPLANT
SHEATH PROBE COVER 6X72 (BAG) IMPLANT
TUBING SMART ABLATE COOLFLOW (TUBING) IMPLANT

## 2022-05-01 NOTE — H&P (Signed)
Electrophysiology Office Note:     Date:  05/01/2022    ID:  [name], DOB [birth year], MRN [id number]   PCP:  [name], MD      [facility] Cardiologist:  Virl Axe, MD  [facility] Electrophysiologist:  Vickie Epley, MD    Referring MD: [name], MD    Chief Complaint: Atrial fibrillation   History of Present Illness:     [name] is a 81 y.o. female who presents for an evaluation of atrial fibrillation at the request of Dr. Caryl Comes. Their medical history includes chronic diastolic heart failure, atrial fibrillation and alcohol use.  She has previously tried flecainide and amiodarone for her atrial fibrillation.  She is on dabigatran for anticoagulation.   Given the association of her atrial fibrillation to decompensated diastolic heart failure, she was referred to discuss catheter ablation.   Today she is with her friend in clinic.  She tells me that she was very ill when she was in atrial fibrillation and had decompensated diastolic heart failure.  She is tolerating Eliquis without any bleeding issues.  She is tolerating amiodarone 200 mg by mouth once daily.   Presents today in sinus rhythm for PVI. Procedure reviewed.    Objective      Past Medical History:  Diagnosis Date   (HFpEF) heart failure with preserved ejection fraction ([place])      a. 08/2017 Echo: EF 55-60%, no rwma, mild MR, mildly dil LA, nl RV fxn; b. 03/2020 Echo: EF 60-65%, no rwma, Gr2 DD. RVSP 44.43mHg. Mod dil LA. Mild MR; c. 09/2020 Echo: EF 50-55%, no rwma, mild LVH, mod red RV fxn, mildly dily RA.   Acute CHF (congestive heart failure) ([place] 09/18/2020   Alcohol abuse 04/05/2020   [place]ast [place]    [place] breast   [place]    left breast ca   Carotid arterial disease ([place]      a. 10/2004 s/p L CEA; b. 12/2015 Carotid U/S: RICA 1-39%; b. LICA patent CEA site.   Closed right hip fracture ([place] 09/20/2019   GERD (gastroesophageal reflux disease)      Hyperlipidemia     Hypertension     Osteoarthritis, multiple sites     Osteopenia     Persistent atrial fibrillation ([place]      a. Dx 08/2017; b. CHA2DS2VASc = 6-->Pradaxa; c. 09/2017 Successful DCCV (second shock - 200J); d. 10/2017 Recurrent Afib-->flecainide started 11/2017; e. 06/2020 s/p DCCV (200J x 1); f. 06/2020 Recurrent AF->Flec inc 75bid; g. 09/2020 WCT->flec d/c'd->amio started.   Personal history of radiation therapy 2001    left breast ca   Psoriasis     Wide-complex tachycardia      a. 09/2020 in setting of presumed Flecainide toxicity.  Flecainide d/c'd.           Past Surgical History:  Procedure Laterality Date   ABDOMINAL HYSTERECTOMY       ANKLE FRACTURE SURGERY   4/08    left---hardware still in place   BREAST BIOPSY Left 2001    breast ca   BREAST EXCISIONAL BIOPSY Left yrs ago    benign   BREAST LUMPECTOMY Left 2001    f/u radiation   CARDIOVERSION N/A 10/04/2017    Procedure: CARDIOVERSION;  Surgeon: [name] MD;  Location: [facility] ORS;  Service: Cardiovascular;  Laterality: N/A;   CARDIOVERSION N/A 12/16/2017    Procedure: CARDIOVERSION;  Surgeon: [name] MD;  Location:  [place] [facility];  Service: Cardiovascular;  Laterality: N/A;   CARDIOVERSION N/A 06/24/2020    Procedure: CARDIOVERSION;  Surgeon: Wellington Hampshire, MD;  Location: [facility] ORS;  Service: Cardiovascular;  Laterality: N/A;   CAROTID ENDARTERECTOMY Left 10/23/2004   FRACTURE SURGERY       OOPHORECTOMY       SHOULDER SURGERY   6/07    left   TONSILLECTOMY AND ADENOIDECTOMY       TOTAL HIP ARTHROPLASTY   2004    right      Current Medications: Active Medications      Current Meds  Medication Sig   acetaminophen (TYLENOL) 325 MG tablet Take 2 tablets (650 mg total) by mouth every 4 (four) hours as needed for headache or mild pain.   allopurinol (ZYLOPRIM) 100 MG tablet TAKE 1.5 TABLETS ('150MG'$  TOTAL) BY MOUTH DAILY   amiodarone (PACERONE) 200 MG tablet TAKE 1 TABLET BY MOUTH EVERY DAY    amLODipine (NORVASC) 5 MG tablet Take 1 tablet (5 mg total) by mouth daily.   apixaban (ELIQUIS) 2.5 MG TABS tablet Take 1 tablet (2.5 mg total) by mouth 2 (two) times daily.   cholecalciferol (VITAMIN D) 25 MCG (1000 UNIT) tablet Take 2 tablets (2,000 Units total) by mouth daily.   furosemide (LASIX) 40 MG tablet Take 0.5 tablet (20 mg) by mouth once daily as needed for swelling   LORazepam (ATIVAN) 0.5 MG tablet TAKE 1 TABLET BY MOUTH TWICE A DAY AS NEEDED FOR ANXIETY   mirtazapine (REMERON) 15 MG tablet TAKE 1 TABLET BY MOUTH EVERYDAY AT BEDTIME   Multiple Vitamin (MULTIVITAMIN) capsule Take 1 capsule by mouth daily.   Multiple Vitamins-Minerals (PRESERVISION AREDS 2+MULTI VIT PO) Take 1 capsule by mouth in the morning and at bedtime.        Allergies:   Patient has no known allergies.    Social History         Socioeconomic History   Marital status: Widowed      Spouse name: Not on file   Number of children: Not on file   Years of education: Not on file   Highest education level: Not on file  Occupational History   Occupation: Marketing      Comment: Retired  Tobacco Use   Smoking status: Former      Packs/day: 1.00      Years: 40.00      Total pack years: 40.00      Types: Cigarettes      Quit date: 07/21/1995      Years since quitting: 26.5   Smokeless tobacco: Never  Vaping Use   Vaping Use: Never used  Substance and Sexual Activity   Alcohol use: Yes      Alcohol/week: 14.0 standard drinks of alcohol      Types: 7 Glasses of wine, 7 Standard drinks or equivalent per week      Comment: 1 glass of wine and 1 drink of liquor daily   Drug use: No   Sexual activity: Never  Other Topics Concern   Not on file  Social History Narrative    1 natural child    3 adopted children    Artist---still teaches water colors    Husband has Alzheimers         Has living will    Daughter Amy is health care POA    DNR     No tube feeds if cognitively unaware    Social  Determinants of Health  Financial Resource Strain: Low Risk  (02/10/2021)    Overall Financial Resource Strain (CARDIA)     Difficulty of Paying Living Expenses: Not hard at all  Food Insecurity: No Food Insecurity (02/10/2021)    Hunger Vital Sign     Worried About Running Out of Food in the Last Year: Never true     Ran Out of Food in the Last Year: Never true  Transportation Needs: No Transportation Needs (02/10/2021)    PRAPARE - Armed forces logistics/support/administrative officer (Medical): No     Lack of Transportation (Non-Medical): No  Physical Activity: Unknown (02/10/2021)    Exercise Vital Sign     Days of Exercise per Week: 0 days     Minutes of Exercise per Session: Not on file  Stress: No Stress Concern Present (02/10/2021)    [place]     Feeling of Stress : Only a little  Social Connections: Unknown (02/10/2021)    Social Connection and Isolation Panel [NHANES]     Frequency of Communication with Friends and Family: More than three times a week     Frequency of Social Gatherings with Friends and Family: More than three times a week     Attends Religious Services: Not on Stage manager or Organizations: Not on file     Attends Archivist Meetings: Not on file     Marital Status: Not on file      Family History: The patient's family history includes Hypertension in her brother; Leukemia in her father; Pneumonia in her mother. There is no history of Heart disease, Diabetes, or Breast cancer.   ROS:   Please see the history of present illness.    All other systems reviewed and are negative.   EKGs/Labs/Other Studies Reviewed:     The following studies were reviewed today:   December 29, 2021 echo LV function normal, 55% RV function normal Trivial MR   January 15, 2022 EKG personally reviewed shows sinus rhythm with a borderline prolonged first-degree AV delay and a QTc of about 500  ms         Recent Labs: 11/05/2021: B Natriuretic Peptide 237.2 11/19/2021: Magnesium 1.6 01/12/2022: ALT 19; Hemoglobin 11.9; Platelets 291; TSH 1.259 01/13/2022: BUN 16; Creatinine, Ser 0.87; Potassium 3.6; Sodium 141  Recent Lipid Panel Labs (Brief)          Component Value Date/Time    CHOL 216 (H) 01/11/2019 0952    TRIG 116.0 01/11/2019 0952    HDL 88.90 01/11/2019 0952    CHOLHDL 2 01/11/2019 0952    VLDL 23.2 01/11/2019 0952    LDLCALC 104 (H) 01/11/2019 0952    LDLDIRECT 151.4 02/01/2012 1113        Physical Exam:     VS:  BP 170/76   Pulse 83   Ht '5\' 5"'$  (1.651 m)   Wt 116 lb (52.6 kg)   BMI 19.30 kg/m         Wt Readings from Last 3 Encounters:  02/11/22 116 lb (52.6 kg)  01/15/22 115 lb (52.2 kg)  01/13/22 118 lb 6.2 oz (53.7 kg)      GEN: Well nourished, well developed in no acute distress.  Appears younger than stated age 31: Normal NECK: No JVD; No carotid bruits LYMPHATICS: No lymphadenopathy CARDIAC: RRR, no murmurs, rubs, gallops RESPIRATORY:  Clear to auscultation without rales, wheezing or rhonchi  ABDOMEN: Soft, non-tender, non-distended MUSCULOSKELETAL:  No edema; No deformity  SKIN: Warm and dry NEUROLOGIC:  Alert and oriented x 3 PSYCHIATRIC:  Normal affect          Assessment ASSESSMENT:     1. Chronic heart failure with preserved ejection fraction (HCC)   2. Persistent atrial fibrillation ([place])   3. High risk medication use     PLAN:     In order of problems listed above:   #Persistent atrial fibrillation On amiodarone.  We discussed using catheter ablation to help maintain normal rhythm.  We discussed long-term antiarrhythmic use.  She has continued to have breakthrough episodes despite treatment with amiodarone.  She would like to proceed with catheter ablation.  I discussed the details of the procedure including the risks and recovery and potential need for repeat ablation and she wishes to proceed.   Discussed treatment  options today for his AF including antiarrhythmic drug therapy and ablation. Discussed risks, recovery and likelihood of success. Discussed potential need for repeat ablation procedures and antiarrhythmic drugs after an initial ablation. They wish to proceed with scheduling.   Risk, benefits, and alternatives to EP study and radiofrequency ablation for afib were also discussed in detail today. These risks include but are not limited to stroke, bleeding, vascular damage, tamponade, perforation, damage to the esophagus, lungs, and other structures, pulmonary vein stenosis, worsening renal function, and death. The patient understands these risk and wishes to proceed.  We will therefore proceed with catheter ablation at the next available time.  Carto, ICE, anesthesia are requested for the procedure.  Will also obtain CT PV protocol prior to the procedure to exclude LAA thrombus and further evaluate atrial anatomy.     Presents today for PVI. Procedure reviewed.     Signed, Hilton Cork. Quentin Ore, MD, [facility], [facility] 05/01/2022 Electrophysiology [facility]

## 2022-05-01 NOTE — Transfer of Care (Signed)
Immediate Anesthesia Transfer of Care Note  Patient: [name]  Procedure(s) Performed: ATRIAL FIBRILLATION ABLATION  Patient Location: PACU  Anesthesia Type:General  Level of Consciousness: drowsy  Airway & Oxygen Therapy: Patient Spontanous Breathing and Patient connected to nasal cannula oxygen  Post-op Assessment: Report given to RN and Post -op Vital signs reviewed and stable  Post vital signs: Reviewed and stable  Last Vitals:  Vitals Value Taken Time  BP 133/52 05/01/22 1214  Temp 36.6 C 05/01/22 1214  Pulse 63 05/01/22 1215  Resp 20 05/01/22 1215  SpO2 96 % 05/01/22 1215  Vitals shown include unvalidated device data.  Last Pain:  Vitals:   05/01/22 1214  TempSrc: Temporal  PainSc: 0-No pain         Complications: There were no known notable events for this encounter.

## 2022-05-01 NOTE — Anesthesia Procedure Notes (Signed)
Procedure Name: Intubation Date/Time: 05/01/2022 10:19 AM  Performed by: Lorie Phenix, CRNAPre-anesthesia Checklist: Patient identified, Emergency Drugs available, Suction available and Patient being monitored Patient Re-evaluated:Patient Re-evaluated prior to induction Oxygen Delivery Method: Circle system utilized Preoxygenation: Pre-oxygenation with 100% oxygen Induction Type: IV induction Ventilation: Mask ventilation without difficulty Laryngoscope Size: Mac and 3 Grade View: Grade I Tube type: Oral Tube size: 7.0 mm Number of attempts: 1 Airway Equipment and Method: Stylet Placement Confirmation: ETT inserted through vocal cords under direct vision, positive ETCO2 and breath sounds checked- equal and bilateral Secured at: 21 cm Tube secured with: Tape Dental Injury: Teeth and Oropharynx as per pre-operative assessment

## 2022-05-01 NOTE — Discharge Instructions (Addendum)
Cardiac Ablation, Care After  This sheet gives you information about how to care for yourself after your procedure. Your health care provider may also give you more specific instructions. If you have problems or questions, contact your health care provider. What can I expect after the procedure? After the procedure, it is common to have: Bruising around your puncture site. Tenderness around your puncture site. Skipped heartbeats. Tiredness (fatigue).  Follow these instructions at home: Puncture site care  Follow instructions from your health care provider about how to take care of your puncture site. Make sure you: If present, leave stitches (sutures), skin glue, or adhesive strips in place. These skin closures may need to stay in place for up to 2 weeks. If adhesive strip edges start to loosen and curl up, you may trim the loose edges. Do not remove adhesive strips completely unless your health care provider tells you to do that. If a large square bandage is present, this may be removed 24 hours after surgery.  Check your puncture site every day for signs of infection. Check for: Redness, swelling, or pain. Fluid or blood. If your puncture site starts to bleed, lie down on your back, apply firm pressure to the area, and contact your health care provider. Warmth. Pus or a bad smell. A pea or small marble sized lump at the site is normal and can take up to three months to resolve.  Driving Do not drive for at least 4 days after your procedure or however long your health care provider recommends. (Do not resume driving if you have previously been instructed not to drive for other health reasons.) Do not drive or use heavy machinery while taking prescription pain medicine. Activity Avoid activities that take a lot of effort for at least 7 days after your procedure. Do not lift anything that is heavier than 5 lb (4.5 kg) for one week.  No sexual activity for 1 week.  Return to your normal  activities as told by your health care provider. Ask your health care provider what activities are safe for you. General instructions Take over-the-counter and prescription medicines only as told by your health care provider. Do not use any products that contain nicotine or tobacco, such as cigarettes and e-cigarettes. If you need help quitting, ask your health care provider. You may shower after 24 hours, but Do not take baths, swim, or use a hot tub for 1 week.  Do not drink alcohol for 24 hours after your procedure. Keep all follow-up visits as told by your health care provider. This is important. Contact a health care provider if: You have redness, mild swelling, or pain around your puncture site. You have fluid or blood coming from your puncture site that stops after applying firm pressure to the area. Your puncture site feels warm to the touch. You have pus or a bad smell coming from your puncture site. You have a fever. You have chest pain or discomfort that spreads to your neck, jaw, or arm. You are sweating a lot. You feel nauseous. You have a fast or irregular heartbeat. You have shortness of breath. You are dizzy or light-headed and feel the need to lie down. You have pain or numbness in the arm or leg closest to your puncture site. Get help right away if: Your puncture site suddenly swells. Your puncture site is bleeding and the bleeding does not stop after applying firm pressure to the area. These symptoms may represent a serious problem that is   an emergency. Do not wait to see if the symptoms will go away. Get medical help right away. Call your local emergency services (911 in the [place] Do not drive yourself to the hospital. Summary After the procedure, it is normal to have bruising and tenderness at the puncture site in your groin, neck, or forearm. Check your puncture site every day for signs of infection. Get help right away if your puncture site is bleeding and the  bleeding does not stop after applying firm pressure to the area. This is a medical emergency. This information is not intended to replace advice given to you by your health care provider. Make sure you discuss any questions you have with your health care provider.    You have an appointment set up with the [facility].  Multiple studies have shown that being followed by a dedicated atrial fibrillation clinic in addition to the standard care you receive from your other physicians improves health. We believe that enrollment in the atrial fibrillation clinic will allow [place] to better care for you.   The phone number to the [facility] is [phone]. The clinic is staffed Monday through Friday from 8:30am to 5pm.  Directions: The clinic is located in the [facility], [place] the hospital at the MAIN ENTRANCE "A", use [address] to the 6th floor.  Registration desk to the right of elevators on 6th floor  If you have any trouble locating the clinic, please don't hesitate to call [phone].

## 2022-05-01 NOTE — Anesthesia Preprocedure Evaluation (Addendum)
Anesthesia Evaluation  Patient identified by MRN, date of birth, ID band Patient awake    Reviewed: Allergy & Precautions, NPO status , Patient's Chart, lab work & pertinent test results  Airway Mallampati: II       Dental   Pulmonary former smoker,    breath sounds clear to auscultation       Cardiovascular hypertension, +CHF and + DOE   Rhythm:Regular Rate:Normal     Neuro/Psych PSYCHIATRIC DISORDERS    GI/Hepatic Neg liver ROS, GERD  ,  Endo/Other  negative endocrine ROS  Renal/GU Renal disease     Musculoskeletal  (+) Arthritis ,   Abdominal   Peds  Hematology   Anesthesia Other Findings   Reproductive/Obstetrics                             Anesthesia Physical Anesthesia Plan  ASA: 3  Anesthesia Plan: General   Post-op Pain Management: Tylenol PO (pre-op)*   Induction: Intravenous  PONV Risk Score and Plan: 3 and Ondansetron, Dexamethasone, Midazolam and Treatment may vary due to age or medical condition  Airway Management Planned: Oral ETT  Additional Equipment:   Intra-op Plan:   Post-operative Plan: Possible Post-op intubation/ventilation  Informed Consent: I have reviewed the patients History and Physical, chart, labs and discussed the procedure including the risks, benefits and alternatives for the proposed anesthesia with the patient or authorized representative who has indicated his/her understanding and acceptance.     Dental advisory given  Plan Discussed with: CRNA and Anesthesiologist  Anesthesia Plan Comments:        Anesthesia Quick Evaluation

## 2022-05-04 ENCOUNTER — Encounter (HOSPITAL_COMMUNITY): Payer: Self-pay | Admitting: Cardiology

## 2022-05-08 ENCOUNTER — Telehealth: Payer: Self-pay | Admitting: Physician Assistant

## 2022-05-08 ENCOUNTER — Other Ambulatory Visit: Payer: Self-pay

## 2022-05-08 ENCOUNTER — Encounter: Payer: Self-pay | Admitting: Emergency Medicine

## 2022-05-08 ENCOUNTER — Emergency Department
Admission: EM | Admit: 2022-05-08 | Discharge: 2022-05-08 | Disposition: A | Discharge status: Home / Self Care | Payer: Medicare Other | Attending: Emergency Medicine | Admitting: Emergency Medicine

## 2022-05-08 ENCOUNTER — Emergency Department: Payer: Medicare Other

## 2022-05-08 DIAGNOSIS — R0602 Shortness of breath: Secondary | ICD-10-CM | POA: Diagnosis not present

## 2022-05-08 DIAGNOSIS — R7989 Other specified abnormal findings of blood chemistry: Secondary | ICD-10-CM | POA: Diagnosis not present

## 2022-05-08 DIAGNOSIS — I700 Atherosclerosis of aorta: Secondary | ICD-10-CM | POA: Diagnosis not present

## 2022-05-08 DIAGNOSIS — I4891 Unspecified atrial fibrillation: Secondary | ICD-10-CM | POA: Diagnosis not present

## 2022-05-08 DIAGNOSIS — I499 Cardiac arrhythmia, unspecified: Secondary | ICD-10-CM | POA: Diagnosis not present

## 2022-05-08 LAB — CBC
HCT: 38.5 % (ref 36.0–46.0)
Hemoglobin: 12.7 g/dL (ref 12.0–15.0)
MCH: 34.1 pg — ABNORMAL HIGH (ref 26.0–34.0)
MCHC: 33 g/dL (ref 30.0–36.0)
MCV: 103.5 fL — ABNORMAL HIGH (ref 80.0–100.0)
Platelets: 279 10*3/uL (ref 150–400)
RBC: 3.72 MIL/uL — ABNORMAL LOW (ref 3.87–5.11)
RDW: 14.6 % (ref 11.5–15.5)
WBC: 5.7 10*3/uL (ref 4.0–10.5)
nRBC: 0 % (ref 0.0–0.2)

## 2022-05-08 LAB — BASIC METABOLIC PANEL
Anion gap: 15 (ref 5–15)
BUN: 24 mg/dL — ABNORMAL HIGH (ref 8–23)
CO2: 24 mmol/L (ref 22–32)
Calcium: 9 mg/dL (ref 8.9–10.3)
Chloride: 103 mmol/L (ref 98–111)
Creatinine, Ser: 1.18 mg/dL — ABNORMAL HIGH (ref 0.44–1.00)
GFR, Estimated: 46 mL/min — ABNORMAL LOW (ref >60)
Glucose, Bld: 108 mg/dL — ABNORMAL HIGH (ref 70–99)
Potassium: 3.7 mmol/L (ref 3.5–5.1)
Sodium: 142 mmol/L (ref 135–145)

## 2022-05-08 LAB — TROPONIN I (HIGH SENSITIVITY)
Troponin I (High Sensitivity): 60 ng/L — ABNORMAL HIGH (ref <18)
Troponin I (High Sensitivity): 50 ng/L — ABNORMAL HIGH (ref <18)

## 2022-05-08 LAB — BRAIN NATRIURETIC PEPTIDE: B Natriuretic Peptide: 343 pg/mL — ABNORMAL HIGH (ref 0.0–100.0)

## 2022-05-08 MED ORDER — PROPOFOL 10 MG/ML IV BOLUS
50.0000 mg | Freq: Once | INTRAVENOUS | Status: AC
  Administered 2022-05-08: 40 mg via INTRAVENOUS
  Filled 2022-05-08: qty 20

## 2022-05-08 MED ORDER — DILTIAZEM HCL 25 MG/5ML IV SOLN
5.0000 mg | Freq: Once | INTRAVENOUS | Status: AC
  Administered 2022-05-08: 5 mg via INTRAVENOUS
  Filled 2022-05-08: qty 5

## 2022-05-08 MED ORDER — AMIODARONE LOAD VIA INFUSION
150.0000 mg | Freq: Once | INTRAVENOUS | Status: AC
  Administered 2022-05-08: 150 mg via INTRAVENOUS
  Filled 2022-05-08: qty 83.34

## 2022-05-08 MED ORDER — AMIODARONE HCL IN DEXTROSE 360-4.14 MG/200ML-% IV SOLN: 30.0000 mg/h | INTRAVENOUS | Status: DC

## 2022-05-08 MED ORDER — DILTIAZEM HCL 25 MG/5ML IV SOLN
10.0000 mg | Freq: Once | INTRAVENOUS | Status: AC
  Administered 2022-05-08: 10 mg via INTRAVENOUS

## 2022-05-08 MED ORDER — AMIODARONE HCL IN DEXTROSE 360-4.14 MG/200ML-% IV SOLN
60.0000 mg/h | INTRAVENOUS | Status: DC
  Administered 2022-05-08: 60 mg/h via INTRAVENOUS

## 2022-05-08 NOTE — Sedation Documentation (Addendum)
Cardioverted 120 syncronized

## 2022-05-08 NOTE — Discharge Instructions (Signed)
Please seek medical attention for any high fevers, chest pain, shortness of breath, change in behavior, persistent vomiting, bloody stool or any other new or concerning symptoms.  

## 2022-05-08 NOTE — ED Provider Notes (Signed)
Dr. Fletcher Anon with cardiology evaluated the patient. Recommended electric cardioversion. Discussed and consented patient for this procedure. Patient tolerated procedure well. Normal sinus rhythm. Will plan on discharging to follow up with cardiology.  .Sedation  Date/Time: 05/08/2022 5:13 PM  Performed by: Nance Pear, MD Authorized by: Nance Pear, MD   Consent:    Consent obtained:  Written   Consent given by:  Patient   Risks discussed:  Respiratory compromise necessitating ventilatory assistance and intubation and inadequate sedation Universal protocol:    Immediately prior to procedure, a time out was called: yes   Pre-sedation assessment:    Time since last food or drink:  Unknown   NPO status caution: unable to specify NPO status     ASA classification: class 2 - patient with mild systemic disease     Mallampati score:  II - soft palate, uvula, fauces visible   Pre-sedation assessments completed and reviewed: airway patency, cardiovascular function, hydration status, mental status, nausea/vomiting and respiratory function   Procedure details (see MAR for exact dosages):    Total Provider sedation time (minutes):  15 Post-procedure details:    Post-sedation assessments completed and reviewed: airway patency, cardiovascular function, hydration status, mental status, nausea/vomiting, pain level and respiratory function     Patient is stable for discharge or admission: yes     Procedure completion:  Tolerated well, no immediate complications  ELECTRIC CARDIOVERSION Performed by: Nance Pear Consent:  The procedure was performed in an urgent situation.  Verbal consent was obtained.  Written consent was obtained. Risks and benefits: risks, benefits and alternatives were discussed Consent given by: patient Patient understanding: patient states understanding of the procedure being performed Patient consent: the patient's understanding of the procedure matches consent  given Required items: required blood products, implants, devices, and special equipment available Patient identity confirmed: verbally with patient Patient sedated: yes Sedation type: moderate (conscious) sedation Sedatives: propofol Analgesia: None Cardioversion basis:  urgent Pre-procedure rhythm: atrial fibrillation Patient position: patient was placed in a supine position Chest area: chest area exposed Electrodes: pads Electrodes placed: anterior-posterior Number of attempts: 1 Attempt 1 mode: synchronous Attempt 1 waveform: biphasic Attempt 1 shock (in Joules): 120 Attempt 1 outcome: conversion to normal sinus rhythm Post-procedure rhythm: normal sinus rhythm Complications: no complications Patient tolerance: Patient tolerated the procedure well with no immediate complications     Nance Pear, MD 05/08/22 1714

## 2022-05-08 NOTE — Telephone Encounter (Signed)
Patient paged the after hour answering service due to worsening fatigue and SOB since yesterday. She has a history of chronic diastolic CHF, alcohol use and atrial fibrillation who recently underwent ablation a week ago on 10/13. She is on Eliquis and amiodarone. She previously does not tolerate afib very well as afib tend to push her into CHF.  After her afib ablation a week ago, she says she did not feel much change in the first few days. Yesterday, she says she started having worsening fatigue and SOB. She woke up this morning around 6AM by her smart watch regarding possible afib. Since this morning, she can feel the palpitation. HR right now is about 85 bpm which is really not bad. However she says she is quite fatigued, she has SOB at rest, but get a lot worse when she ambulate.   Although even if she is back in afib, her HR is considered normal by her watch, however this is a patient who historically does not tolerate afib very well. I discussed 2 options, option #1 is for her to call our office in 6 min (around 8AM to arrange an earlier visit), option #2 is for her to go to ED. I am leaning for toward ED eval as even if we can squeeze her in today, we probably won't be able to cardiovert her until next week and I also am not sure if she is in acute CHF now. Given how symptomatic she is, I did recommend she seek medical attention at [facility] ED.

## 2022-05-08 NOTE — ED Triage Notes (Signed)
Pt to ED via POV, pt states that she has hx/o A. Fib. Pt had an ablation done last week. Pt reports that she started feeling bad yesterday. Pt reports dizziness, weakness, and shortness of breath. Pt states that she noticed on her watch that she was in A. Fib. Pt states that she does take Eliquis.

## 2022-05-08 NOTE — Consult Note (Signed)
Cardiology Consultation   Patient ID: [name] MRN: [id number]; DOB: [birth year]  Admit date: 05/08/2022 Date of Consult: 05/08/2022  PCP:  [name], [facility] Cardiologist:  Virl Axe, MD  Electrophysiologist:  Vickie Epley, MD       Patient Profile:   [name] is a 81 y.o. female with a hx of persistent atrial fibrillation status post recent A-fib ablation 1 week ago by Dr. Quentin Ore who is being seen 05/08/2022 for the evaluation of recurrent atrial fibrillation with RVR at the request of Dr. Cherylann Banas.  History of Present Illness:   [name] is an 81 year old female with known history of persistent atrial fibrillation status post ablation 1 week ago by Dr. Quentin Ore, chronic diastolic heart failure and previous breast cancer.  Her atrial fibrillation is usually associated with decompensated diastolic heart failure.  Post ablation, she felt well but not great overall.  However, symptoms worsened over the last 2 days with increased shortness of breath and palpitations.  She checked her smart watch and was told that she was in atrial fibrillation.  She called the clinic was instructed to come to the ED for evaluation.  She was noted to be in A-fib with RVR.  I discussed the case with Dr. Curt Bears.  Initially, she was started on IV amiodarone but so far she has not converted to sinus rhythm.  The recommendation is to proceed with cardioversion in the ED and discharged on current medications.  She denies chest pain.   Past Medical History:  Diagnosis Date   (HFpEF) heart failure with preserved ejection fraction ([place])    a. 08/2017 Echo: EF 55-60%, no rwma, mild MR, mildly dil LA, nl RV fxn; b. 03/2020 Echo: EF 60-65%, no rwma, Gr2 DD. RVSP 44.15mHg. Mod dil LA. Mild MR; c. 09/2020 Echo: EF 50-55%, no rwma, mild LVH, mod red RV fxn, mildly dily RA.   Acute CHF (congestive heart failure) ([facility] 09/18/2020   Alcohol abuse 04/05/2020    [place] [place]   left breast   Cancer ([facility] [place]   left breast ca   Carotid arterial disease ([facility]    a. 10/2004 s/p L CEA; b. 12/2015 Carotid U/S: RICA 1-39%; b. LICA patent CEA site.   Closed right hip fracture ([facility] 09/20/2019   GERD (gastroesophageal reflux disease)    Hyperlipidemia    Hypertension    Osteoarthritis, multiple sites    Osteopenia    Persistent atrial fibrillation ([facility]    a. Dx 08/2017; b. CHA2DS2VASc = 6-->Pradaxa; c. 09/2017 Successful DCCV (second shock - 200J); d. 10/2017 Recurrent Afib-->flecainide started 11/2017; e. 06/2020 s/p DCCV (200J x 1); f. 06/2020 Recurrent AF->Flec inc 75bid; g. 09/2020 WCT->flec d/c'd->amio started.   Personal history of radiation therapy 2001   left breast ca   Psoriasis    Wide-complex tachycardia    a. 09/2020 in setting of presumed Flecainide toxicity.  Flecainide d/c'd.    Past Surgical History:  Procedure Laterality Date   ABDOMINAL HYSTERECTOMY     ANKLE FRACTURE SURGERY  4/08   left---hardware still in place   ATRIAL FIBRILLATION ABLATION N/A 05/01/2022   Procedure: ATRIAL FIBRILLATION ABLATION;  Surgeon: LVickie Epley MD;  [facility];  Service: Cardiovascular;  Laterality: N/A;   BREAST BIOPSY Left 2001   breast ca   BREAST EXCISIONAL BIOPSY Left yrs ago   benign   BREAST LUMPECTOMY Left 2001   f/u radiation  CARDIOVERSION N/A 10/04/2017   Procedure: CARDIOVERSION;  Surgeon: Wellington Hampshire, MD;  Location: [facility];  Service: Cardiovascular;  Laterality: N/A;   CARDIOVERSION N/A 12/16/2017   Procedure: CARDIOVERSION;  Surgeon: [name], MD;  Location: [facility];  Service: Cardiovascular;  Laterality: N/A;   CARDIOVERSION N/A 06/24/2020   Procedure: CARDIOVERSION;  Surgeon: Wellington Hampshire, MD;  Location: [facility];  Service: Cardiovascular;  Laterality: N/A;   CAROTID ENDARTERECTOMY Left 10/23/2004   FRACTURE SURGERY     OOPHORECTOMY     SHOULDER SURGERY  6/07   left    TONSILLECTOMY AND ADENOIDECTOMY     TOTAL HIP ARTHROPLASTY  2004   right     Home Medications:  Prior to Admission medications   Medication Sig Start Date End Date Taking? Authorizing Provider  acetaminophen (TYLENOL) 325 MG tablet Take 2 tablets (650 mg total) by mouth every 4 (four) hours as needed for headache or mild pain. 09/19/20   Swayze, Ava, DO  allopurinol (ZYLOPRIM) 100 MG tablet TAKE 1.5 TABLETS ('150MG'$  TOTAL) BY MOUTH DAILY 01/07/22   [name], MD  amiodarone (PACERONE) 200 MG tablet TAKE 1 TABLET BY MOUTH EVERY DAY 03/31/22   Vickie Epley, MD  amLODipine (NORVASC) 5 MG tablet Take 5 mg by mouth daily.    [provider]  apixaban (ELIQUIS) 2.5 MG TABS tablet Take 1 tablet (2.5 mg total) by mouth 2 (two) times daily. 09/09/21   [name], MD  cholecalciferol (VITAMIN D) 25 MCG (1000 UNIT) tablet Take 2 tablets (2,000 Units total) by mouth daily. 09/20/20   Swayze, Ava, DO  colchicine 0.6 MG tablet Take 1 tablet (0.6 mg total) by mouth 2 (two) times daily for 5 days. 05/01/22 05/06/22  Baldwin Jamaica, PA-C  furosemide (LASIX) 20 MG tablet Take 1 tablet (20 mg total) by mouth daily. 03/06/22   [name], MD  LORazepam (ATIVAN) 0.5 MG tablet TAKE 1 TABLET BY MOUTH TWICE A DAY AS NEEDED FOR ANXIETY 02/27/22   [name], MD  mirtazapine (REMERON) 15 MG tablet TAKE 1 TABLET BY MOUTH EVERYDAY AT BEDTIME 09/22/21   Dutch Quint B, FNP  Multiple Vitamin (MULTIVITAMIN) capsule Take 1 capsule by mouth daily.    [provider]  Multiple Vitamins-Minerals (PRESERVISION AREDS 2+MULTI VIT PO) Take 1 capsule by mouth in the morning and at bedtime.    [provider]  pantoprazole (PROTONIX) 40 MG tablet Take 1 tablet (40 mg total) by mouth daily. 05/01/22 06/15/22  Baldwin Jamaica, PA-C    Inpatient Medications: Scheduled Meds:  propofol  50 mg Intravenous Once   Continuous Infusions:  amiodarone 60 mg/hr (05/08/22 1535)   Followed  by   amiodarone     PRN Meds:   Allergies:   No Known Allergies  Social History:   Social History   Socioeconomic History   Marital status: Widowed    Spouse name: Not on file   Number of children: Not on file   Years of education: Not on file   Highest education level: Not on file  Occupational History   Occupation: Marketing    Comment: Retired  Tobacco Use   Smoking status: Former    Packs/day: 1.00    Years: 40.00    Total pack years: 40.00    Types: Cigarettes    Quit date: 07/21/1995    Years since quitting: 26.8   Smokeless tobacco: Never  Vaping Use   Vaping Use: Never  used  Substance and Sexual Activity   Alcohol use: Yes    Alcohol/week: 14.0 standard drinks of alcohol    Types: 7 Glasses of wine, 7 Standard drinks or equivalent per week    Comment: 1 glass of wine and 1 drink of liquor daily   Drug use: No   Sexual activity: Never  Other Topics Concern   Not on file  Social History Narrative   1 natural child   3 adopted children   Artist---still teaches water colors   Husband has Alzheimers      Has living will   Daughter Amy is health care POA   DNR    No tube feeds if cognitively unaware   Social Determinants of Health   Financial Resource Strain: Low Risk  (02/13/2022)   Overall Financial Resource Strain (CARDIA)    Difficulty of Paying Living Expenses: Not hard at all  Food Insecurity: No Food Insecurity (02/13/2022)   Hunger Vital Sign    Worried About Running Out of Food in the Last Year: Never true    Ran Out of Food in the Last Year: Never true  Transportation Needs: No Transportation Needs (02/13/2022)   PRAPARE - Hydrologist (Medical): No    Lack of Transportation (Non-Medical): No  Physical Activity: Unknown (02/10/2021)   Exercise Vital Sign    Days of Exercise per Week: 0 days    Minutes of Exercise per Session: Not on file  Stress: No Stress Concern Present (02/13/2022)   [place]    Feeling of Stress : Not at all  Social Connections: Unknown (02/13/2022)   Social Connection and Isolation Panel [NHANES]    Frequency of Communication with Friends and Family: More than three times a week    Frequency of Social Gatherings with Friends and Family: More than three times a week    Attends Religious Services: Not on file    Active Member of Clubs or Organizations: Not on file    Attends Archivist Meetings: Not on file    Marital Status: Not on file  Intimate Partner Violence: Not At Risk (02/13/2022)   Humiliation, Afraid, Rape, and Kick questionnaire    Fear of Current or Ex-Partner: No    Emotionally Abused: No    Physically Abused: No    Sexually Abused: No    Family History:    Family History  Problem Relation Age of Onset   Hypertension Brother    Pneumonia Mother    Leukemia Father    Heart disease Neg Hx    Diabetes Neg Hx    Breast cancer Neg Hx      ROS:  Please see the history of present illness.   All other ROS reviewed and negative.     Physical Exam/Data:   Vitals:   05/08/22 1412 05/08/22 1430 05/08/22 1500 05/08/22 1530  BP:  (!) 126/92 114/86 105/86  Pulse:  (!) 103 (!) 113 (!) 112  Resp:  16    Temp: 98.3 F (36.8 C)     TempSrc: Oral     SpO2:  98% 99% 99%  Weight:      Height:       No intake or output data in the 24 hours ending 05/08/22 1623    05/08/2022    8:36 AM 05/01/2022    8:31 AM 02/27/2022   10:14 AM  Last 3 Weights  Weight (lbs) 112  lb 115 lb 118 lb 6.4 oz  Weight (kg) 50.803 kg 52.164 kg 53.706 kg     Body mass index is 17.54 kg/m.  General: Frail female, in no acute distress HEENT: normal Neck: no JVD Vascular: No carotid bruits; Distal pulses 2+ bilaterally Cardiac:  normal S1, S2; irregularly irregular and mildly tachycardic; no murmur  Lungs:  clear to auscultation bilaterally, no wheezing, rhonchi or rales  Abd: soft, nontender, no  hepatomegaly  Ext: no edema Musculoskeletal:  No deformities, BUE and BLE strength normal and equal Skin: warm and dry  Neuro:  CNs 2-12 intact, no focal abnormalities noted Psych:  Normal affect   EKG:  The EKG was personally reviewed and demonstrates: Atrial fibrillation with rapid ventricular response Telemetry:  Telemetry was personally reviewed and demonstrates:    Relevant CV Studies:   Laboratory Data:  High Sensitivity Troponin:   Recent Labs  Lab 05/08/22 0838 05/08/22 1130  TROPONINIHS 60* 50*     Chemistry Recent Labs  Lab 05/08/22 0838  NA 142  K 3.7  CL 103  CO2 24  GLUCOSE 108*  BUN 24*  CREATININE 1.18*  CALCIUM 9.0  GFRNONAA 46*  ANIONGAP 15    No results for input(s): "PROT", "ALBUMIN", "AST", "ALT", "ALKPHOS", "BILITOT" in the last 168 hours. Lipids No results for input(s): "CHOL", "TRIG", "HDL", "LABVLDL", "LDLCALC", "CHOLHDL" in the last 168 hours.  Hematology Recent Labs  Lab 05/08/22 0838  WBC 5.7  RBC 3.72*  HGB 12.7  HCT 38.5  MCV 103.5*  MCH 34.1*  MCHC 33.0  RDW 14.6  PLT 279   Thyroid No results for input(s): "TSH", "FREET4" in the last 168 hours.  BNP Recent Labs  Lab 05/08/22 0838  BNP 343.0*    DDimer No results for input(s): "DDIMER" in the last 168 hours.   Radiology/Studies:  DG Chest 2 View  Result Date: 05/08/2022 CLINICAL DATA:  81 year old female with history of irregular heartbeat. EXAM: CHEST - 2 VIEW COMPARISON:  Chest x-ray 02/17/2022. FINDINGS: Lung volumes are normal. No consolidative airspace disease. No pleural effusions. No pneumothorax. No pulmonary nodule or mass noted. Pulmonary vasculature and the cardiomediastinal silhouette are within normal limits. Atherosclerosis in the thoracic aorta. Multiple old healed left-sided rib fractures are again noted. IMPRESSION: 1. No radiographic evidence of acute cardiopulmonary disease. 2. Aortic atherosclerosis. Electronically Signed   By: Vinnie Langton M.D.    On: 05/08/2022 09:11     Assessment and Plan:   Atrial fibrillation with RVR status post recent A-fib ablation 1 week ago: Likely related to increased inflammation.  As the patient is usually very symptomatic during A-fib, recommend restoration of normal sinus rhythm.  If the patient does not convert to sinus rhythm shortly with IV amiodarone, recommend proceeding with cardioversion.  I discussed this with Dr. Cherylann Banas.  She is anticoagulated with Eliquis 2.5 mg twice daily given her age and weight.  She has not missed any dose.  I discussed the procedure in details as well as risk and benefits.     For questions or updates, please contact [place] Please consult [email or web address] for contact info under    Signed, Kathlyn Sacramento, MD  05/08/2022 4:23 PM

## 2022-05-08 NOTE — ED Provider Notes (Addendum)
[facility] Provider Note    Event Date/Time   First MD Initiated Contact with Patient 05/08/22 1057     (approximate)   History   Irregular Heart Beat   HPI  [name] is a 81 y.o. female with a history of atrial fibrillation on amiodarone and Eliquis status post ablation on 10/13 who presents with palpitations and some shortness of breath.  She believes they started around noon yesterday but she really started to notice the symptoms this morning.  She states she has been compliant with her medications.  She states she generally has not been feeling that much better since the ablation last week.  She denies any chest pain.  She had some lightheadedness earlier that is better.  She denies any leg swelling.    Physical Exam   Triage Vital Signs: ED Triage Vitals  Enc Vitals Group     BP 05/08/22 0834 (!) 129/114     Pulse Rate 05/08/22 0834 (!) 136     Resp 05/08/22 0834 20     Temp 05/08/22 0834 98.4 F (36.9 C)     Temp Source 05/08/22 0834 Oral     SpO2 05/08/22 0834 97 %     Weight 05/08/22 0836 112 lb (50.8 kg)     Height 05/08/22 0836 '5\' 7"'$  (1.702 m)     Head Circumference --      Peak Flow --      Pain Score 05/08/22 0841 0     Pain Loc --      Pain Edu? --      Excl. in [place]? --     Most recent vital signs: Vitals:   05/08/22 1430 05/08/22 1500  BP: (!) 126/92 114/86  Pulse: (!) 103 (!) 113  Resp: 16   Temp:    SpO2: 98% 99%     General: Alert and oriented, well-appearing. CV:  Good peripheral perfusion.  Tachycardic, irregular rhythm. Resp:  Normal effort.  Lungs CTAB. Abd:  No distention.  Other:  No peripheral edema.   ED Results / Procedures / Treatments   Labs (all labs ordered are listed, but only abnormal results are displayed) Labs Reviewed  BASIC METABOLIC PANEL - Abnormal; Notable for the following components:      Result Value   Glucose, Bld 108 (*)    BUN 24 (*)    Creatinine, Ser 1.18 (*)    GFR,  Estimated 46 (*)    All other components within normal limits  CBC - Abnormal; Notable for the following components:   RBC 3.72 (*)    MCV 103.5 (*)    MCH 34.1 (*)    All other components within normal limits  BRAIN NATRIURETIC PEPTIDE - Abnormal; Notable for the following components:   B Natriuretic Peptide 343.0 (*)    All other components within normal limits  TROPONIN I (HIGH SENSITIVITY) - Abnormal; Notable for the following components:   Troponin I (High Sensitivity) 60 (*)    All other components within normal limits  TROPONIN I (HIGH SENSITIVITY) - Abnormal; Notable for the following components:   Troponin I (High Sensitivity) 50 (*)    All other components within normal limits     EKG  ED ECG REPORT I, Arta Silence, the attending physician, personally viewed and interpreted this ECG.  Date: 05/08/2022 EKG Time: 08 33 Rate: 133 Rhythm: Atrial fibrillation with RVR QRS Axis: Right axis  Intervals: normal ST/T Wave abnormalities: nonspecific ST abnormalities  Narrative Interpretation: no evidence of acute ischemia    RADIOLOGY  Chest x-ray: I independently viewed and interpreted the images; there is no focal consolidation or edema  PROCEDURES:  Critical Care performed: No  Procedures   MEDICATIONS ORDERED IN ED: Medications  amiodarone (NEXTERONE) 1.8 mg/mL load via infusion 150 mg (150 mg Intravenous Bolus from Bag 05/08/22 1520)    Followed by  amiodarone (NEXTERONE PREMIX) 360-4.14 MG/200ML-% (1.8 mg/mL) IV infusion (has no administration in time range)    Followed by  amiodarone (NEXTERONE PREMIX) 360-4.14 MG/200ML-% (1.8 mg/mL) IV infusion (has no administration in time range)  diltiazem (CARDIZEM) injection 5 mg (5 mg Intravenous Given 05/08/22 1145)  diltiazem (CARDIZEM) injection 10 mg (10 mg Intravenous Given 05/08/22 1238)     IMPRESSION / MDM / ASSESSMENT AND PLAN / ED COURSE  I reviewed the triage vital signs and the nursing  notes.  81 year old female with PMH as noted above presents with palpitations and some shortness of breath since approximately noon yesterday.  EKG reveals atrial fibrillation with RVR.  I reviewed the past medical records including a note from Dr. Quentin Ore from cardiology from 10/13.  The patient had an ablation at that time.  She is on Eliquis and amiodarone.  Differential diagnosis includes, but is not limited to, refractory atrial fibrillation, other tachydysrhythmia, ACS, acute CHF.  We will obtain labs including cardiac enzymes and BNP to rule out acute CHF, chest x-ray, give IV Cardizem for rate control, and reassess.  ----------------------------------------- 3:23 PM on 05/08/2022 -----------------------------------------  The patient had some improvement in rate with Cardizem but not significantly.  I consulted Dr. Fletcher Anon from cardiology.  He recommends starting the patient on amiodarone bolus and infusion with the hope of possibly cardioverting her medically.  If she does not respond she may require admission.  Initial troponin was slightly elevated and repeat is improved.  BNP is minimally elevated.  There is no evidence of ACS or acute CHF.  I have signed the patient out to the oncoming ED physician Dr. Archie Balboa.  There is a possibility that if the patient converts to sinus she might be able to go home.     Patient's presentation is most consistent with acute presentation with potential threat to life or bodily function.  The patient is on the cardiac monitor to evaluate for evidence of arrhythmia and/or significant heart rate changes.   FINAL CLINICAL IMPRESSION(S) / ED DIAGNOSES   Final diagnoses:  Atrial fibrillation with RVR ([place])     Rx / DC Orders   ED Discharge Orders     None        Note:  This document was prepared using Dragon voice recognition software and may include unintentional dictation errors.    Arta Silence, MD 05/08/22 1524     Arta Silence, MD 05/08/22 1606

## 2022-05-11 ENCOUNTER — Telehealth: Payer: Self-pay | Admitting: Internal Medicine

## 2022-05-11 ENCOUNTER — Encounter (HOSPITAL_COMMUNITY): Payer: Self-pay | Admitting: Cardiology

## 2022-05-11 NOTE — Telephone Encounter (Signed)
Patient c/o Palpitations:  High priority if patient c/o lightheadedness, shortness of breath, or chest pain  How long have you had palpitations/irregular HR/ Afib? Are you having the symptoms now?  Patient is following up, stating she has still been in afib on and off (see 10/20 encounter), per her watch Patient has concerns due to still being in afib post cardioversion. Patient is seeking further advisement from Dr. Caryl Comes if possible.  Are you currently experiencing lightheadedness, SOB or CP?  No symptoms currently  Do you have a history of afib (atrial fibrillation) or irregular heart rhythm?  Yes   Have you checked your BP or HR? (document readings if available):  Current HR - 83 (2:40 PM)  No BP readings. Patient plans to take her BP and have it ready when her call is returned  Are you experiencing any other symptoms?  More SOB recently, not currently

## 2022-05-11 NOTE — Anesthesia Postprocedure Evaluation (Signed)
Anesthesia Post Note  Patient: [name]  Procedure(s) Performed: ATRIAL FIBRILLATION ABLATION     Patient location during evaluation: PACU Anesthesia Type: General Level of consciousness: awake and alert Pain management: pain level controlled Vital Signs Assessment: post-procedure vital signs reviewed and stable Respiratory status: spontaneous breathing, nonlabored ventilation, respiratory function stable and patient connected to nasal cannula oxygen Cardiovascular status: blood pressure returned to baseline and stable Postop Assessment: no apparent nausea or vomiting Anesthetic complications: no   There were no known notable events for this encounter.  Last Vitals:  Vitals:   05/01/22 1600 05/01/22 1615  BP: 131/62   Pulse: 73 70  Resp: (!) 22 18  Temp:    SpO2: 94% 93%    Last Pain:  Vitals:   05/01/22 1323  TempSrc:   PainSc: 0-No pain                 Effie Berkshire

## 2022-05-11 NOTE — Telephone Encounter (Signed)
Pt called to report despite cardioversion on 10/20, she feels like she is flipping in and out of afib.  Pt stated yesterday she felt great, but this morning she was SOB and HR ranging from 68-127. Pt stated currently she feels fine. BP 130/94 HR 90.   Pt has an appointment schedule with the afib clinic on 11/10.  However, based on pt history and symptoms, nurse contacted afib clinic and had appointment moved up to this Friday 10/27 at 8:30 am.  Pt made aware and verbalized understanding.

## 2022-05-13 ENCOUNTER — Other Ambulatory Visit: Payer: Self-pay | Admitting: Internal Medicine

## 2022-05-13 NOTE — Telephone Encounter (Signed)
Refill request

## 2022-05-13 NOTE — Telephone Encounter (Signed)
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 Please address

## 2022-05-13 NOTE — Telephone Encounter (Signed)
Prescription refill request for Eliquis received. Indication: AF Last office visit: 02/11/22 Grayce Sessions MD Scr: 1.18 on 05/08/22 Age: 81 Weight: 52.6kg  Based on above findings Eliquis 2.'5mg'$  twice daily is the appropriate dose.  Refill approved.

## 2022-05-14 NOTE — Progress Notes (Signed)
Primary Care Physician: [name], MD Primary Cardiologist: none Primary Electrophysiologist: Dr Caryl Comes Referring Physician: Dr Morrie Sheldon is a 81 y.o. 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 She has been maintained on amiodarone but continued to have breakthrough episodes of afib and underwent afib ablation with Dr Quentin Ore on 05/01/22. Patient is on Eliquis for a CHADS2VASC score of 5. She presented to the ED 05/08/22 with palpitations and SOB, found to be in afib with RVR. She received a bolus of amiodarone to see if she would chemically convert. She did not convert and underwent DCCV at that time.   On follow up today, patient reports that she feels well and remains in [place]. Her Apple Watch has not shown any interim afib. She denies chest pain, swallowing pain, or groin issues.   Today, she denies symptoms of palpitations, chest pain, shortness of breath, orthopnea, PND, lower extremity edema, dizziness, presyncope, syncope, snoring, daytime somnolence, bleeding, or neurologic sequela. The patient is tolerating medications without difficulties and is otherwise without complaint today.    Atrial Fibrillation Risk Factors:  she does not have symptoms or diagnosis of sleep apnea. she does not have a history of rheumatic fever. she does have a history of alcohol use.   she has a BMI of Body mass index is 18.61 kg/m.Marland Kitchen Filed Weights   05/15/22 0822  Weight: 53.9 kg    Family History  Problem Relation Age of Onset   Hypertension Brother    Pneumonia Mother    Leukemia Father    Heart disease Neg Hx    Diabetes Neg Hx    Breast cancer Neg Hx      Atrial Fibrillation Management history:  Previous antiarrhythmic drugs: flecainide, amiodarone Previous cardioversions: 2019 x2,  2021 Previous ablations: 05/01/22 CHADS2VASC score: 5 Anticoagulation history: Eliquis   Past Medical History:  Diagnosis Date   (HFpEF) heart failure with preserved ejection fraction ([place])    a. 08/2017 Echo: EF 55-60%, no rwma, mild MR, mildly dil LA, nl RV fxn; b. 03/2020 Echo: EF 60-65%, no rwma, Gr2 DD. RVSP 44.76mHg. Mod dil LA. Mild MR; c. 09/2020 Echo: EF 50-55%, no rwma, mild LVH, mod red RV fxn, mildly dily RA.   Acute CHF (congestive heart failure) ([facility] 09/18/2020   Alcohol abuse 04/05/2020   Breast cancer ([facility] 2001   left breast   [place] [place]   left breast ca   Carotid arterial disease ([facility]    a. 10/2004 s/p L CEA; b. 12/2015 Carotid U/S: RICA 1-39%; b. LICA patent CEA site.   Closed right hip fracture ([facility] 09/20/2019   GERD (gastroesophageal reflux disease)    Hyperlipidemia    Hypertension    Osteoarthritis, multiple sites    Osteopenia    Persistent atrial fibrillation ([facility]    a. Dx 08/2017; b. CHA2DS2VASc = 6-->Pradaxa; c. 09/2017 Successful DCCV (second shock - 200J); d. 10/2017 Recurrent Afib-->flecainide started 11/2017; e. 06/2020 s/p DCCV (200J x 1); f. 06/2020 Recurrent AF->Flec inc 75bid; g. 09/2020 WCT->flec d/c'd->amio started.   Personal history of radiation therapy 2001   left breast ca   Psoriasis    Wide-complex tachycardia    a. 09/2020 in setting of presumed Flecainide toxicity.  Flecainide d/c'd.   Past Surgical History:  Procedure Laterality Date   ABDOMINAL HYSTERECTOMY  ANKLE FRACTURE SURGERY  4/08   left---hardware still in place   ATRIAL FIBRILLATION ABLATION N/A 05/01/2022   Procedure: [place];  Surgeon: Vickie Epley, MD;  Location: [facility];  Service: Cardiovascular;  Laterality: N/A;   BREAST BIOPSY Left 2001   breast ca   BREAST EXCISIONAL BIOPSY Left yrs ago   benign   BREAST LUMPECTOMY Left 2001   f/u radiation   CARDIOVERSION N/A 10/04/2017   Procedure: CARDIOVERSION;  Surgeon: Wellington Hampshire, MD;  Location: [facility];  Service: Cardiovascular;  Laterality: N/A;   CARDIOVERSION N/A 12/16/2017   Procedure: CARDIOVERSION;  Surgeon: [name], MD;  Location: [facility];  Service: Cardiovascular;  Laterality: N/A;   CARDIOVERSION N/A 06/24/2020   Procedure: CARDIOVERSION;  Surgeon: Wellington Hampshire, MD;  Location: [facility];  Service: Cardiovascular;  Laterality: N/A;   CAROTID ENDARTERECTOMY Left 10/23/2004   FRACTURE SURGERY     OOPHORECTOMY     SHOULDER SURGERY  6/07   left   TONSILLECTOMY AND ADENOIDECTOMY     TOTAL HIP ARTHROPLASTY  2004   right    Current Outpatient Medications  Medication Sig Dispense Refill   acetaminophen (TYLENOL) 325 MG tablet Take 2 tablets (650 mg total) by mouth every 4 (four) hours as needed for headache or mild pain.     allopurinol (ZYLOPRIM) 100 MG tablet TAKE 1.5 TABLETS ('150MG'$  TOTAL) BY MOUTH DAILY 135 tablet 1   amiodarone (PACERONE) 200 MG tablet TAKE 1 TABLET BY MOUTH EVERY DAY 90 tablet 1   amLODipine (NORVASC) 5 MG tablet Take 5 mg by mouth daily.     apixaban (ELIQUIS) 2.5 MG TABS tablet TAKE 1 TABLET BY MOUTH TWICE A DAY 60 tablet 5   cholecalciferol (VITAMIN D) 25 MCG (1000 UNIT) tablet Take 2 tablets (2,000 Units total) by mouth daily.     furosemide (LASIX) 20 MG tablet Take 1 tablet (20 mg total) by mouth daily. 90 tablet 1   LORazepam (ATIVAN) 0.5 MG tablet TAKE 1 TABLET BY MOUTH TWICE A DAY AS NEEDED FOR ANXIETY 60 tablet 0   mirtazapine (REMERON) 15 MG tablet TAKE 1 TABLET BY MOUTH EVERYDAY AT BEDTIME 90 tablet 3   Multiple Vitamin (MULTIVITAMIN) capsule Take 1 capsule by mouth daily.     Multiple Vitamins-Minerals (PRESERVISION AREDS 2+MULTI VIT PO) Take 1 capsule by mouth in the morning and at bedtime.     pantoprazole (PROTONIX) 40 MG tablet Take 1 tablet (40 mg total) by mouth daily. 45 tablet 0   No current facility-administered medications for this encounter.    No Known Allergies  Social History   Socioeconomic  History   Marital status: Widowed    Spouse name: Not on file   Number of children: Not on file   Years of education: Not on file   Highest education level: Not on file  Occupational History   Occupation: Marketing    Comment: Retired  Tobacco Use   Smoking status: Former    Packs/day: 1.00    Years: 40.00    Total pack years: 40.00    Types: Cigarettes    Quit date: 07/21/1995    Years since quitting: 26.8   Smokeless tobacco: Never   Tobacco comments:    Former smoker 05/15/22  Vaping Use   Vaping Use: Never used  Substance and Sexual Activity   Alcohol use: Yes    Alcohol/week: 14.0 standard drinks of alcohol    Types: 7 Glasses of  wine, 7 Standard drinks or equivalent per week    Comment: 1 glass of wine and 1 drink of liquor daily 05/15/22   Drug use: No   Sexual activity: Never  Other Topics Concern   Not on file  Social History Narrative   1 natural child   3 adopted children   Artist---still teaches water colors   Husband has Alzheimers      Has living will   Daughter Amy is health care POA   DNR    No tube feeds if cognitively unaware   Social Determinants of Health   Financial Resource Strain: Low Risk  (02/13/2022)   Overall Financial Resource Strain (CARDIA)    Difficulty of Paying Living Expenses: Not hard at all  Food Insecurity: No Food Insecurity (02/13/2022)   Hunger Vital Sign    Worried About Running Out of Food in the Last Year: Never true    Ran Out of Food in the Last Year: Never true  Transportation Needs: No Transportation Needs (02/13/2022)   PRAPARE - Hydrologist (Medical): No    Lack of Transportation (Non-Medical): No  Physical Activity: Unknown (02/10/2021)   Exercise Vital Sign    Days of Exercise per Week: 0 days    Minutes of Exercise per Session: Not on file  Stress: No Stress Concern Present (02/13/2022)   [place]    Feeling of  Stress : Not at all  Social Connections: Unknown (02/13/2022)   Social Connection and Isolation Panel [NHANES]    Frequency of Communication with Friends and Family: More than three times a week    Frequency of Social Gatherings with Friends and Family: More than three times a week    Attends Religious Services: Not on file    Active Member of Clubs or Organizations: Not on file    Attends Archivist Meetings: Not on file    Marital Status: Not on file  Intimate Partner Violence: Not At Risk (02/13/2022)   Humiliation, Afraid, Rape, and Kick questionnaire    Fear of Current or Ex-Partner: No    Emotionally Abused: No    Physically Abused: No    Sexually Abused: No     ROS- All systems are reviewed and negative except as per the HPI above.  Physical Exam: Vitals:   05/15/22 0822  BP: 122/82  Pulse: 74  Weight: 53.9 kg  Height: '5\' 7"'$  (1.702 m)    GEN- The patient is a well appearing elderly female, alert and oriented x 3 today.   Head- normocephalic, atraumatic Eyes-  Sclera clear, conjunctiva pink Ears- hearing intact Oropharynx- clear Neck- supple  Lungs- Clear to ausculation bilaterally, normal work of breathing Heart- Regular rate and rhythm, no murmurs, rubs or gallops  GI- soft, NT, ND, + BS Extremities- no clubbing, cyanosis, or edema MS- no significant deformity or atrophy Skin- no rash or lesion Psych- euthymic mood, full affect Neuro- strength and sensation are intact  Wt Readings from Last 3 Encounters:  05/15/22 53.9 kg  05/08/22 50.8 kg  05/01/22 52.2 kg    EKG today demonstrates  SR Vent. rate 74 BPM PR interval 194 ms QRS duration 84 ms QT/QTcB 442/490 ms  Echo 12/29/21 demonstrated   1. Left ventricular ejection fraction, by estimation, is 55 to 60%. The  left ventricle has normal function. The left ventricle has no regional  wall motion abnormalities. Left ventricular diastolic parameters were  normal.   2. Right ventricular systolic  function is normal. The right ventricular  size is normal.   3. The mitral valve is normal in structure. Trivial mitral valve  regurgitation.   4. The aortic valve was not well visualized. Aortic valve regurgitation  is not visualized.   5. The inferior vena cava is normal in size with greater than 50%  respiratory variability, suggesting right atrial pressure of 3 mmHg.   Epic records are reviewed at length today  CHA2DS2-VASc Score = 5  The patient's score is based upon: CHF History: 0 HTN History: 1 Diabetes History: 0 Stroke History: 0 Vascular Disease History: 1 Age Score: 2 Gender Score: 1       ASSESSMENT AND PLAN: 1. Persistent Atrial Fibrillation (ICD10:  I48.19) The patient's CHA2DS2-VASc score is 5, indicating a 7.2% annual risk of stroke.   S/p afib ablation 05/01/22 and DCCV on 05/08/22 We discussed that it is not unusual to have afib soon after ablation during the healing process.  Continue amiodarone 200 mg daily Continue Eliquis 2.5 mg BID with no missed doses for 3 months post ablation.   2. Secondary Hypercoagulable State (ICD10:  D68.69) The patient is at significant risk for stroke/thromboembolism based upon her CHA2DS2-VASc Score of 5.  Continue Apixaban (Eliquis).   3. Chronic HFpEF Fluid status appears stable today.  4. CAD CAC score on CT 211, mild nonobstructive disease. No anginal symptoms.   Follow up with Dr Caryl Comes and Dr Quentin Ore as scheduled.    [facility] [address] [place], [place] [address] [phone] 05/15/2022 8:50 AM

## 2022-05-15 ENCOUNTER — Ambulatory Visit (HOSPITAL_COMMUNITY)
Admission: RE | Admit: 2022-05-15 | Discharge: 2022-05-15 | Disposition: A | Discharge status: Home / Self Care | Payer: Medicare Other | Source: Ambulatory Visit | Attending: Physician Assistant | Admitting: Physician Assistant

## 2022-05-15 ENCOUNTER — Encounter (HOSPITAL_COMMUNITY): Payer: Self-pay | Admitting: Physician Assistant

## 2022-05-15 VITALS — BP 122/82 | HR 74 | Ht 67.0 in | Wt 118.8 lb

## 2022-05-15 DIAGNOSIS — I2510 Atherosclerotic heart disease of native coronary artery without angina pectoris: Secondary | ICD-10-CM | POA: Insufficient documentation

## 2022-05-15 DIAGNOSIS — E785 Hyperlipidemia, unspecified: Secondary | ICD-10-CM | POA: Diagnosis not present

## 2022-05-15 DIAGNOSIS — I110 Hypertensive heart disease with heart failure: Secondary | ICD-10-CM | POA: Diagnosis not present

## 2022-05-15 DIAGNOSIS — D6869 Other thrombophilia: Secondary | ICD-10-CM | POA: Insufficient documentation

## 2022-05-15 DIAGNOSIS — I4819 Other persistent atrial fibrillation: Secondary | ICD-10-CM | POA: Insufficient documentation

## 2022-05-15 DIAGNOSIS — I5032 Chronic diastolic (congestive) heart failure: Secondary | ICD-10-CM | POA: Insufficient documentation

## 2022-05-15 DIAGNOSIS — Z7901 Long term (current) use of anticoagulants: Secondary | ICD-10-CM | POA: Diagnosis not present

## 2022-05-18 DIAGNOSIS — Z23 Encounter for immunization: Secondary | ICD-10-CM | POA: Diagnosis not present

## 2022-05-29 ENCOUNTER — Ambulatory Visit (HOSPITAL_COMMUNITY): Payer: Medicare Other | Admitting: Physician Assistant

## 2022-06-09 ENCOUNTER — Encounter: Payer: Self-pay | Admitting: Family Medicine

## 2022-06-09 ENCOUNTER — Ambulatory Visit (INDEPENDENT_AMBULATORY_CARE_PROVIDER_SITE_OTHER): Payer: Medicare Other | Admitting: Family Medicine

## 2022-06-09 VITALS — BP 116/74 | HR 82 | Temp 97.5°F | Ht 67.0 in | Wt 116.8 lb

## 2022-06-09 DIAGNOSIS — I2510 Atherosclerotic heart disease of native coronary artery without angina pectoris: Secondary | ICD-10-CM

## 2022-06-09 DIAGNOSIS — I2584 Coronary atherosclerosis due to calcified coronary lesion: Secondary | ICD-10-CM

## 2022-06-09 DIAGNOSIS — I10 Essential (primary) hypertension: Secondary | ICD-10-CM | POA: Diagnosis not present

## 2022-06-09 DIAGNOSIS — F320 Major depressive disorder, single episode, mild: Secondary | ICD-10-CM | POA: Diagnosis not present

## 2022-06-09 DIAGNOSIS — F419 Anxiety disorder, unspecified: Secondary | ICD-10-CM | POA: Diagnosis not present

## 2022-06-09 DIAGNOSIS — I480 Paroxysmal atrial fibrillation: Secondary | ICD-10-CM

## 2022-06-09 MED ORDER — MIRTAZAPINE 30 MG PO TABS: 30.0000 mg | ORAL_TABLET | Freq: Every day | ORAL | 3 refills | Status: DC

## 2022-06-09 MED ORDER — LORAZEPAM 0.5 MG PO TABS: ORAL_TABLET | ORAL | 0 refills | Status: DC

## 2022-06-09 NOTE — Patient Instructions (Signed)
Please reduce your alcohol intake to 1 or fewer drinks nightly.   We are going to increase your mirtazapine to 30 mg daily.  If you have excessive drowsiness the next day after taking this please let us know.

## 2022-06-09 NOTE — Progress Notes (Signed)
[name], MD Phone: [phone]  [name] is a 81 y.o. female who presents today for f/u.  HYPERTENSION/AFIB Disease Monitoring Home BP Monitoring not checking Chest pain- no    Dyspnea- no Palpitations- no Medications Compliance-  taking amiodarone, eliquis, lasix.  Edema- no Bleeding- no Patient underwent ablation and cardioversion relatively recently.  She notes her Apple Watch has not advised her she has been in A-fib at all recently. BMET    Component Value Date/Time   NA 142 05/08/2022 0838   NA 145 (H) 08/11/2021 1029   NA 139 05/01/2013 1605   K 3.7 05/08/2022 0838   K 4.2 05/01/2013 1605   CL 103 05/08/2022 0838   CL 105 05/01/2013 1605   CO2 24 05/08/2022 0838   CO2 25 05/01/2013 1605   GLUCOSE 108 (H) 05/08/2022 0838   GLUCOSE 89 05/01/2013 1605   BUN 24 (H) 05/08/2022 0838   BUN 28 (H) 08/11/2021 1029   BUN 17 05/01/2013 1605   CREATININE 1.18 (H) 05/08/2022 0838   CREATININE 0.88 05/01/2013 1605   CALCIUM 9.0 05/08/2022 0838   CALCIUM 9.2 05/01/2013 1605   GFRNONAA 46 (L) 05/08/2022 0838   GFRNONAA >60 05/01/2013 1605   GFRAA 59 (L) 06/20/2020 1021   GFRAA >60 05/01/2013 1605   Sleep issues: Patient notes at times she wakes up in the middle the night or wakes up early around 4 AM.  Typically goes to bed around 10 PM and wants to wake up around 7:30 AM.  She has occasional nightmares.  She does have anxiety.  She denies depression.  She does have 2 alcoholic beverages nightly.  No caffeine late in the day.  She does watch some TV before bed.  Anxiety/depression: Patient has no depression.  She does report some anxiety.  At times she will take the Ativan a few nights in a row and then she will go 10 days without having to take it.  She is on Remeron.  She notes neither of these make her drowsy.  At times the Ativan works in the opposite manner and makes her jittery.  Patient reports no falls.  Social History   Tobacco Use  Smoking Status  Former   Packs/day: 1.00   Years: 40.00   Total pack years: 40.00   Types: Cigarettes   Quit date: 07/21/1995   Years since quitting: 26.9  Smokeless Tobacco Never  Tobacco Comments   Former smoker 05/15/22    Current Outpatient Medications on File Prior to Visit  Medication Sig Dispense Refill   allopurinol (ZYLOPRIM) 100 MG tablet TAKE 1.5 TABLETS ('150MG'$  TOTAL) BY MOUTH DAILY 135 tablet 1   amiodarone (PACERONE) 200 MG tablet TAKE 1 TABLET BY MOUTH EVERY DAY 90 tablet 1   amLODipine (NORVASC) 5 MG tablet Take 5 mg by mouth daily.     apixaban (ELIQUIS) 2.5 MG TABS tablet TAKE 1 TABLET BY MOUTH TWICE A DAY 60 tablet 5   cholecalciferol (VITAMIN D) 25 MCG (1000 UNIT) tablet Take 2 tablets (2,000 Units total) by mouth daily.     furosemide (LASIX) 20 MG tablet Take 1 tablet (20 mg total) by mouth daily. 90 tablet 1   Multiple Vitamin (MULTIVITAMIN) capsule Take 1 capsule by mouth daily.     Multiple Vitamins-Minerals (PRESERVISION AREDS 2+MULTI VIT PO) Take 1 capsule by mouth in the morning and at bedtime.     pantoprazole (PROTONIX) 40 MG tablet Take 1 tablet (40 mg total) by mouth daily. 45 tablet  0   acetaminophen (TYLENOL) 325 MG tablet Take 2 tablets (650 mg total) by mouth every 4 (four) hours as needed for headache or mild pain. (Patient not taking: Reported on 06/09/2022)     No current facility-administered medications on file prior to visit.     ROS see history of present illness  Objective  Physical Exam Vitals:   06/09/22 0935  BP: 116/74  Pulse: 82  Temp: (!) 97.5 F (36.4 C)  SpO2: 98%    BP Readings from Last 3 Encounters:  06/09/22 116/74  05/15/22 122/82  05/08/22 118/87   Wt Readings from Last 3 Encounters:  06/09/22 116 lb 12.8 oz (53 kg)  05/15/22 118 lb 12.8 oz (53.9 kg)  05/08/22 112 lb (50.8 kg)    Physical Exam Constitutional:      General: She is not in acute distress.    Appearance: She is not diaphoretic.  Cardiovascular:     Rate and  Rhythm: Normal rate and regular rhythm.     Heart sounds: Normal heart sounds.  Pulmonary:     Effort: Pulmonary effort is normal.     Breath sounds: Normal breath sounds.  Skin:    General: Skin is warm and dry.  Neurological:     Mental Status: She is alert.      Assessment/Plan: Please see individual problem list.  Problem List Items Addressed This Visit     Anxiety (Chronic)    Patient seems to have continued issues with anxiety.  I suspect this and her alcohol intake later in the day are contributing to her sleeping issues.  We will increase her mirtazapine to 30 mg daily.  She will monitor for drowsiness with this.  She can continue very occasional as needed use of Ativan.  I did discuss reducing her alcohol intake to 1 drink nightly at most.  She was counseled against taking the lorazepam anywhere near the time she drinks any alcohol.      Relevant Medications   LORazepam (ATIVAN) 0.5 MG tablet   mirtazapine (REMERON) 30 MG tablet   Atrial fibrillation (HCC) (Chronic)    Sinus rhythm.  Amiodarone and Eliquis are managed by cardiology.      Depression, major, single episode, mild (HCC) (Chronic)    Asymptomatic.      Relevant Medications   LORazepam (ATIVAN) 0.5 MG tablet   mirtazapine (REMERON) 30 MG tablet   HTN (hypertension) - Primary (Chronic)    Well-controlled.  Patient will continue amlodipine 5 mg daily and Lasix 20 mg daily.        Return in about 8 weeks (around 08/04/2022) for Anxiety/sleep.   [name], MD [facility]

## 2022-06-09 NOTE — Assessment & Plan Note (Addendum)
Patient seems to have continued issues with anxiety.  I suspect this and her alcohol intake later in the day are contributing to her sleeping issues.  We will increase her mirtazapine to 30 mg daily.  She will monitor for drowsiness with this.  She can continue very occasional as needed use of Ativan.  I did discuss reducing her alcohol intake to 1 drink nightly at most.  She was counseled against taking the lorazepam anywhere near the time she drinks any alcohol.

## 2022-06-09 NOTE — Assessment & Plan Note (Signed)
Asymptomatic. 

## 2022-06-09 NOTE — Assessment & Plan Note (Signed)
Well-controlled.  Patient will continue amlodipine 5 mg daily and Lasix 20 mg daily.

## 2022-06-09 NOTE — Assessment & Plan Note (Signed)
Sinus rhythm.  Amiodarone and Eliquis are managed by cardiology.

## 2022-06-18 ENCOUNTER — Encounter: Payer: Self-pay | Admitting: Internal Medicine

## 2022-06-18 ENCOUNTER — Ambulatory Visit: Payer: Medicare Other | Attending: Internal Medicine | Admitting: Internal Medicine

## 2022-06-18 VITALS — BP 146/92 | HR 77 | Ht 67.0 in | Wt 113.2 lb

## 2022-06-18 DIAGNOSIS — I10 Essential (primary) hypertension: Secondary | ICD-10-CM | POA: Diagnosis not present

## 2022-06-18 DIAGNOSIS — I2510 Atherosclerotic heart disease of native coronary artery without angina pectoris: Secondary | ICD-10-CM | POA: Diagnosis not present

## 2022-06-18 DIAGNOSIS — Z79899 Other long term (current) drug therapy: Secondary | ICD-10-CM | POA: Diagnosis not present

## 2022-06-18 DIAGNOSIS — I2584 Coronary atherosclerosis due to calcified coronary lesion: Secondary | ICD-10-CM

## 2022-06-18 DIAGNOSIS — I5032 Chronic diastolic (congestive) heart failure: Secondary | ICD-10-CM | POA: Diagnosis not present

## 2022-06-18 DIAGNOSIS — I4819 Other persistent atrial fibrillation: Secondary | ICD-10-CM | POA: Insufficient documentation

## 2022-06-18 DIAGNOSIS — I951 Orthostatic hypotension: Secondary | ICD-10-CM | POA: Insufficient documentation

## 2022-06-18 DIAGNOSIS — R001 Bradycardia, unspecified: Secondary | ICD-10-CM | POA: Insufficient documentation

## 2022-06-18 NOTE — Patient Instructions (Signed)
Medication Instructions:  - Your physician recommends that you continue on your current medications as directed. Please refer to the Current Medication list given to you today.  *If you need a refill on your cardiac medications before your next appointment, please call your pharmacy*   Lab Work: - none ordered  If you have labs (blood work) drawn today and your tests are completely normal, you will receive your results only by: [place] (if you have MyChart) OR A paper copy in the mail If you have any lab test that is abnormal or we need to change your treatment, we will call you to review the results.   Testing/Procedures: - none ordered   Follow-Up: At [facility], you and your health needs are our priority.  As part of our continuing mission to provide you with exceptional heart care, we have created designated Provider Care Teams.  These Care Teams include your primary Cardiologist (physician) and Advanced Practice Providers (APPs -  Physician Assistants and Nurse Practitioners) who all work together to provide you with the care you need, when you need it.  We recommend signing up for the patient portal called "MyChart".  Sign up information is provided on this After Visit Summary.  MyChart is used to connect with patients for Virtual Visits (Telemedicine).  Patients are able to view lab/test results, encounter notes, upcoming appointments, etc.  Non-urgent messages can be sent to your provider as well.   To learn more about what you can do with MyChart, go to [email or web address].    Your next appointment:   6 month(s)  The format for your next appointment:   In Person  Provider:   Virl Axe, MD    Other Instructions N/a  Important Information About Sugar

## 2022-06-18 NOTE — Progress Notes (Signed)
Patient ID: [name], female   DOB: July 21, 1940, 81 y.o.   MRN: [id number]       Patient Care Team: [name], MD as PCP - General (Family Medicine) [name], MD as PCP - Cardiology (Cardiology) Vickie Epley, MD as PCP - Electrophysiology (Cardiology) [name], MD (Family Medicine)   HPI   [name] is a 81 y.o. female seen in follow-up for recurrent atrial fibrillation, treated with  flecainide >> amiodarone and anticoagulation with dabigitran   Has had hospitalizations related to HFpEF with rapid AF. Recurrent cardioversions. Most recently 3/22 at which time the flecainide was replaced with amiodarone   9/21 ER following a fall while Inebriated ( ER BAL >200) Seen and Discussed with DR TT--drinking at least 2 ounces of vodka at night and some wine.  There have been concerns from the children about drinking too much  No interval atrial fibrillation on the amiodarone.  Tolerating it without cough or nausea Had reduced her alcohol by report, she acknowledges 1 cocktail i.e. 1 sugar at night watching the news and a glass of wine with a standard pour at dinner   Some instability with standing and walking.  Has been involved in some exercise but not recently.        Thromboembolic risk factors ( age  -2, HTN-1, Vasc disease -1, Gender-1) for a CHADSVASc Score of 5      husband past away in 2019   DATE TEST EF   2/19 Echo   65 % LAE (48/2.8/46)  9/21 Echo  60-65%   3/22 Echo 50-55% LA normal   6/23 Echo  60-65%     Date Cr K TSH LFT  Hgb  2/19 0.8 3.9   12.5  5/19  1.12 3.8     6/20 0.97 4.4   12.4  12/20 0.9  (CE) 2/21 4.2    10.6  9/21 1.11<<1.7 5.0   10.3<<8.5  6/22 1.08 4.6 2.65 (7/22) 19 (7/22) 11.1  5/23 1.03 3.8 3.64 (4/23) 77 10.0  10/23 1.18 3.7 2.58 (8/23) 59 12.7   Past Surgical History:  Procedure Laterality Date   ABDOMINAL HYSTERECTOMY     ANKLE FRACTURE SURGERY  4/08   left---hardware still in place    ATRIAL FIBRILLATION ABLATION N/A 05/01/2022   Procedure: ATRIAL FIBRILLATION ABLATION;  Surgeon: Vickie Epley, MD;  Location: [facility];  Service: Cardiovascular;  Laterality: N/A;   BREAST BIOPSY Left 2001   breast ca   BREAST EXCISIONAL BIOPSY Left yrs ago   benign   BREAST LUMPECTOMY Left 2001   f/u radiation   CARDIOVERSION N/A 10/04/2017   Procedure: CARDIOVERSION;  Surgeon: Wellington Hampshire, MD;  Location: [facility];  Service: Cardiovascular;  Laterality: N/A;   CARDIOVERSION N/A 12/16/2017   Procedure: CARDIOVERSION;  Surgeon: [name], MD;  Location: [facility];  Service: Cardiovascular;  Laterality: N/A;   CARDIOVERSION N/A 06/24/2020   Procedure: CARDIOVERSION;  Surgeon: Wellington Hampshire, MD;  Location: [facility];  Service: Cardiovascular;  Laterality: N/A;   CAROTID ENDARTERECTOMY Left 10/23/2004   FRACTURE SURGERY     OOPHORECTOMY     SHOULDER SURGERY  6/07   left   TONSILLECTOMY AND ADENOIDECTOMY     TOTAL HIP ARTHROPLASTY  2004   right    Current Outpatient Medications  Medication Sig Dispense Refill   acetaminophen (TYLENOL) 325 MG tablet Take 2 tablets (650 mg total) by mouth every 4 (four)  hours as needed for headache or mild pain.     allopurinol (ZYLOPRIM) 100 MG tablet TAKE 1.5 TABLETS ('150MG'$  TOTAL) BY MOUTH DAILY 135 tablet 1   amiodarone (PACERONE) 200 MG tablet TAKE 1 TABLET BY MOUTH EVERY DAY 90 tablet 1   amLODipine (NORVASC) 5 MG tablet Take 5 mg by mouth daily.     apixaban (ELIQUIS) 2.5 MG TABS tablet TAKE 1 TABLET BY MOUTH TWICE A DAY 60 tablet 5   cholecalciferol (VITAMIN D) 25 MCG (1000 UNIT) tablet Take 2 tablets (2,000 Units total) by mouth daily.     furosemide (LASIX) 20 MG tablet Take 1 tablet (20 mg total) by mouth daily. 90 tablet 1   LORazepam (ATIVAN) 0.5 MG tablet TAKE 1 TABLET BY MOUTH TWICE A DAY AS NEEDED FOR ANXIETY 60 tablet 0   mirtazapine (REMERON) 30 MG tablet Take 1 tablet (30 mg total) by mouth at bedtime. 90 tablet 3    Multiple Vitamin (MULTIVITAMIN) capsule Take 1 capsule by mouth daily.     Multiple Vitamins-Minerals (PRESERVISION AREDS 2+MULTI VIT PO) Take 1 capsule by mouth in the morning and at bedtime.     No current facility-administered medications for this visit.    No Known Allergies  Review of Systems negative except from HPI and PMH  Physical Exam: BP (!) 146/92 (BP Location: Left Arm, Patient Position: Sitting, Cuff Size: Normal)   Pulse 77   Ht '5\' 7"'$  (1.702 m)   Wt 113 lb 4 oz (51.4 kg)   BMI 17.74 kg/m  Well developed and nourished in no acute distress HENT normal Neck supple Clear Regular rate and rhythm, no murmurs or gallops Abd-soft with active BS No Clubbing cyanosis edema Skin-warm and dry A & Oriented  Grossly normal sensory and motor function  ECG sinus at 77 Intervals 18/08/45  Assessment and  Plan  Atrial fibrillation-persistent with a rapid rate  High Risk Medication Surveillance-amiodarone  Dyspnea on exertion  High Risk Medication Surveillance amiodarone  Anemia -- elevated MCV, iron labs normal  Renal insufficiency grade 3  Hypertension   Orthostatic hypotension  Peripheral vascular disease carotid artery occlusion  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  We will continue amiodarone at this juncture.  Tolerating.  Elevated transaminases, this is a longstanding.  Related to alcohol?  Surveillance TSH is normal  Hypertension with orthostatic hypotension.  I think a target systolic blood pressure sitting of 150-160s probably appropriate with a 30 mm drop in systolic blood pressure with standing associated with symptoms.  Anemia is improved.  Encouraged again to decrease alcohol intake, working with physical therapy for balance and leg strength.

## 2022-06-29 DIAGNOSIS — G8929 Other chronic pain: Secondary | ICD-10-CM | POA: Diagnosis not present

## 2022-06-29 DIAGNOSIS — M7541 Impingement syndrome of right shoulder: Secondary | ICD-10-CM | POA: Diagnosis not present

## 2022-06-29 DIAGNOSIS — M25511 Pain in right shoulder: Secondary | ICD-10-CM | POA: Diagnosis not present

## 2022-07-06 ENCOUNTER — Ambulatory Visit: Payer: Medicare Other | Admitting: Podiatry

## 2022-07-09 ENCOUNTER — Encounter: Payer: Medicare Other | Attending: Physician Assistant | Admitting: Physician Assistant

## 2022-07-09 DIAGNOSIS — Z7901 Long term (current) use of anticoagulants: Secondary | ICD-10-CM | POA: Diagnosis not present

## 2022-07-09 DIAGNOSIS — L97322 Non-pressure chronic ulcer of left ankle with fat layer exposed: Secondary | ICD-10-CM | POA: Diagnosis not present

## 2022-07-09 DIAGNOSIS — I130 Hypertensive heart and chronic kidney disease with heart failure and stage 1 through stage 4 chronic kidney disease, or unspecified chronic kidney disease: Secondary | ICD-10-CM | POA: Insufficient documentation

## 2022-07-09 DIAGNOSIS — I480 Paroxysmal atrial fibrillation: Secondary | ICD-10-CM | POA: Diagnosis not present

## 2022-07-09 DIAGNOSIS — N183 Chronic kidney disease, stage 3 unspecified: Secondary | ICD-10-CM | POA: Insufficient documentation

## 2022-07-09 DIAGNOSIS — Z87891 Personal history of nicotine dependence: Secondary | ICD-10-CM | POA: Diagnosis not present

## 2022-07-09 DIAGNOSIS — L97822 Non-pressure chronic ulcer of other part of left lower leg with fat layer exposed: Secondary | ICD-10-CM | POA: Diagnosis not present

## 2022-07-09 DIAGNOSIS — I5042 Chronic combined systolic (congestive) and diastolic (congestive) heart failure: Secondary | ICD-10-CM | POA: Insufficient documentation

## 2022-07-09 DIAGNOSIS — I87332 Chronic venous hypertension (idiopathic) with ulcer and inflammation of left lower extremity: Secondary | ICD-10-CM | POA: Diagnosis not present

## 2022-07-09 DIAGNOSIS — I872 Venous insufficiency (chronic) (peripheral): Secondary | ICD-10-CM | POA: Diagnosis not present

## 2022-07-09 NOTE — Progress Notes (Signed)
[name], [name] ([id number]) [phone] [id number] Page 1 of 5 Visit Report for 07/09/2022 Abuse Risk Screen Details Patient Name: Date of Service: [name], [name] 07/09/2022 8:45 A M Medical Record Number: [id number] Patient Account Number: [id number] Date of Birth/Sex: Treating RN: May 04, 1941 (81 y.o. [name] Primary Care Rondrick Barreira: [name] Other Clinician: Referring [name]: Treating Sujey Gundry/Extender: Jeri Cos Self, Referral Weeks in Treatment: 0 Abuse Risk Screen Items Answer ABUSE RISK SCREEN: Has anyone close to you tried to hurt or harm you recentlyo No Do you feel uncomfortable with anyone in your familyo No Has anyone forced you do things that you didnt want to doo No Electronic Signature(Harris) Signed: 07/09/2022 3:44:25 PM By: Carlene Coria RN Entered By: Carlene Coria on 07/09/2022 09:10:15 -------------------------------------------------------------------------------- Activities of Daily Living Details Patient Name: Date of Service: [name], [name] 07/09/2022 8:45 A M Medical Record Number: [id number] Patient Account Number: [id number] Date of Birth/Sex: Treating RN: 1941/05/15 (81 y.o. [name] Primary Care Happy Ky: [name] Other Clinician: Referring Shiheem Corporan: Treating Daune Divirgilio/Extender: Jeri Cos Self, Referral Weeks in Treatment: 0 Activities of Daily Living Items Answer Activities of Daily Living (Please select one for each item) Drive Automobile Completely Able T Medications ake Completely Able Use T elephone Completely Able Care for Appearance Completely Able Use T oilet Completely Able Bath / Shower Completely Able Dress Self Completely Able Feed Self Completely Able Walk Completely Able Get In / Out Bed Completely Able Housework Completely JAVARIA, KNAPKE ([id number]) [id number] Page 2 of 5 Prepare Meals Completely Able Handle Money  Completely Able Shop for Self Completely Able Electronic Signature(Harris) Signed: 07/09/2022 3:44:25 PM By: Carlene Coria RN Entered By: Carlene Coria on 07/09/2022 09:10:39 -------------------------------------------------------------------------------- Education Screening Details Patient Name: Date of Service: [name] 07/09/2022 8:45 A M Medical Record Number: [id number] Patient Account Number: [id number] Date of Birth/Sex: Treating RN: [birth year] (81 y.o. [name] Primary Care Abrielle Finck: [name] Other Clinician: Referring Aundrea Higginbotham: Treating Loistine Eberlin/Extender: Jeri Cos Self, Referral Weeks in Treatment: 0 Primary Learner Assessed: Patient Learning Preferences/Education Level/Primary Language Learning Preference: Explanation Highest Education Level: College or Above Preferred Language: English Cognitive Barrier Language Barrier: No Translator Needed: No Memory Deficit: No Emotional Barrier: No Physical Barrier Impaired Vision: No Impaired Hearing: No Decreased Hand dexterity: No Knowledge/Comprehension Knowledge Level: High Comprehension Level: High Ability to understand written instructions: High Ability to understand verbal instructions: High Motivation Anxiety Level: Calm Cooperation: Cooperative Education Importance: Acknowledges Need Interest in Health Problems: Asks Questions Perception: Coherent Willingness to Engage in Self-Management High Activities: Readiness to Engage in Self-Management High Activities: Electronic Signature(Harris) Signed: 07/09/2022 3:44:25 PM By: Carlene Coria RN Entered By: Carlene Coria on 07/09/2022 09:13:45 [name] ([id number]) [id number] Page 3 of 5 -------------------------------------------------------------------------------- Fall Risk Assessment Details Patient Name: Date of Service: [name], [name] 07/09/2022 8:45 A M Medical Record Number:  [id number] Patient Account Number: [id number] Date of Birth/Sex: Treating RN: [birth year] (81 y.o. [name] Primary Care Kameria Canizares: [name] Other Clinician: Referring Donia Yokum: Treating Sumi Lye/Extender: Jeri Cos Self, Referral Weeks in Treatment: 0 Fall Risk Assessment Items Have you had 2 or more falls in the last 12 monthso 0 No Have you had any fall that resulted in injury in the last 12 monthso 0 No FALLS RISK SCREEN History of falling - immediate or within 3 months 0 No Secondary diagnosis (Do you have 2 or more medical diagnoseso) 0 No Ambulatory aid None/bed rest/wheelchair/nurse 0 No Crutches/cane/walker 0 No Furniture 0 No Intravenous therapy  Access/Saline/Heparin Lock 0 No Gait/Transferring Normal/ bed rest/ wheelchair 0 No Weak (short steps with or without shuffle, stooped but able to lift head while walking, may seek 0 No support from furniture) Impaired (short steps with shuffle, may have difficulty arising from chair, head down, impaired 0 No balance) Mental Status Oriented to own ability 0 No Electronic Signature(Harris) Signed: 07/09/2022 3:44:25 PM By: Carlene Coria RN Entered By: Carlene Coria on 07/09/2022 09:13:51 -------------------------------------------------------------------------------- Foot Assessment Details Patient Name: Date of Service: [name], [name] 07/09/2022 8:45 A M Medical Record Number: [id number] Patient Account Number: [id number] Date of Birth/Sex: Treating RN: January 15, 1941 (81 y.o. [name] Primary Care Aloysius Heinle: [name] Other Clinician: Referring Arrion Burruel: Treating Nahuel Wilbert/Extender: Jeri Cos Self, Referral Weeks in Treatment: 0 Foot Assessment Items Site Locations Nataliee, Shurtz Angola Harris ([id number]) [phone] [id number] Page 4 of 5 + = Sensation present, - = Sensation absent, C = Callus, U = Ulcer R = Redness, W = Warmth, M = Maceration, PU = Pre-ulcerative lesion F = Fissure,  Harris = Swelling, D = Dryness Assessment Right: Left: Other Deformity: No No Prior Foot Ulcer: No No Prior Amputation: No No Charcot Joint: No No Ambulatory Status: Ambulatory Without Help Gait: Steady Electronic Signature(Harris) Signed: 07/09/2022 3:44:25 PM By: Carlene Coria RN Entered By: Carlene Coria on 07/09/2022 09:18:32 -------------------------------------------------------------------------------- Nutrition Risk Screening Details Patient Name: Date of Service: [name], [name] 07/09/2022 8:45 A M Medical Record Number: [id number] Patient Account Number: [id number] Date of Birth/Sex: Treating RN: 16-Apr-1941 (81 y.o. [name] Primary Care Savannah Morford: [name] Other Clinician: Referring Shireen Rayburn: Treating Alvis Edgell/Extender: Jeri Cos Self, Referral Weeks in Treatment: 0 Height (in): 67 Weight (lbs): 113 Body Mass Index (BMI): 17.7 Nutrition Risk Screening Items Score Screening NUTRITION RISK SCREEN: I have an illness or condition that made me change the kind and/or amount of food I eat 0 No I eat fewer than two meals per day 0 No I eat few fruits and vegetables, or milk products 0 No I have three or more drinks of beer, liquor or wine almost every day 0 No I have tooth or mouth problems that make it hard for me to eat 0 No I don't always have enough money to buy the food I need 0 No [name], [name] ([id number]) [id number] Page 5 of 5 I eat alone most of the time 0 No I take three or more different prescribed or over-the-counter drugs a day 1 Yes Without wanting to, I have lost or gained 10 pounds in the last six months 0 No I am not always physically able to shop, cook and/or feed myself 0 No Nutrition Protocols Good Risk Protocol 0 No interventions needed Moderate Risk Protocol High Risk Proctocol Risk Level: Good Risk Score: 1 Electronic Signature(Harris) Signed: 07/09/2022 3:44:25 PM By: Carlene Coria RN Entered By:  Carlene Coria on 07/09/2022 09:14:05

## 2022-07-09 NOTE — Progress Notes (Signed)
CHABELY, NORBY ([id number]) [id number] Page 1 of 9 Visit Report for 07/09/2022 Allergy List Details Patient Name: Date of Service: [name], [name] 07/09/2022 8:45 A M Medical Record Number: [id number] Patient Account Number: [id number] Date of Birth/Sex: Treating RN: 02/12/1941 (81 y.o. [name] Primary Care Dana [name] Other Clinician: Referring [name]: Treating [name]/Extender: [name] Self, Referral Weeks in Treatment: 0 Allergies Active Allergies No Known Allergies Allergy Notes Electronic Signature(Harris) Signed: 07/09/2022 3:44:25 PM By: Dana Coria RN Entered By: [name] on 07/09/2022 09:09:15 -------------------------------------------------------------------------------- Arrival Information Details Patient Name: Date of Service: [name] 07/09/2022 8:45 A M Medical Record Number: [id number] Patient Account Number: [id number] Date of Birth/Sex: Treating RN: [birth year] (81 y.o. [name] Primary Care Clothilde Tippetts: [name] Other Clinician: Referring Trinaty Bundrick: Treating [name]/Extender: [name] Self, Referral Weeks in Treatment: 0 Visit Information Patient Arrived: Ambulatory Arrival Time: 09:03 Accompanied By: self Transfer Assistance: None Patient Identification Verified: Yes Secondary Verification Process Completed: Yes Patient Requires Transmission-Based Precautions: No Patient Has Alerts: Yes Patient Alerts: Patient on Blood Thinner ABI L 1.22 03/31/22 History Since Last Visit Added or deleted any medications: No Any new allergies or adverse reactions: No Had a fall or experienced change in activities of daily living that may affect risk of falls: No Signs or symptoms of abuse/neglect since last visito No Hospitalized since last visit: No Implantable device outside of the clinic excluding cellular tissue based products placed in the center since last visit:  No Electronic Signature(Harris) [name], [name] ([id number]) [id number] Page 2 of 9 Signed: 07/09/2022 3:44:25 PM By: Dana Coria RN Entered By: [name] on 07/09/2022 09:21:58 -------------------------------------------------------------------------------- Clinic Level of Care Assessment Details Patient Name: Date of Service: [name], [name] 07/09/2022 8:45 A M Medical Record Number: [id number] Patient Account Number: [id number] Date of Birth/Sex: Treating RN: 11/03/1940 (81 y.o. [name] Primary Care Janise Gora: [name] Other Clinician: Referring Subrina Vecchiarelli: Treating Axtyn Woehler/Extender: [name] Self, Referral Weeks in Treatment: 0 Clinic Level of Care Assessment Items TOOL 1 Quantity Score X- 1 0 Use when EandM and Procedure is performed on INITIAL visit ASSESSMENTS - Nursing Assessment / Reassessment X- 1 20 General Physical Exam (combine w/ comprehensive assessment (listed just below) when performed on new pt. evals) X- 1 25 Comprehensive Assessment (HX, ROS, Risk Assessments, Wounds Hx, etc.) ASSESSMENTS - Wound and Skin Assessment / Reassessment _0  - 0 Dermatologic / Skin Assessment (not related to wound area) ASSESSMENTS - Ostomy and/or Continence Assessment and Care _1  - 0 Incontinence Assessment and Management _2  - 0 Ostomy Care Assessment and Management (repouching, etc.) PROCESS - Coordination of Care _3  - 0 Simple Patient / Family Education for ongoing care _4  - 0 Complex (extensive) Patient / Family Education for ongoing care _5  - 0 Staff obtains Programmer, systems, Records, T Results / Process Orders est _6  - 0 Staff telephones HHA, Nursing Homes / Clarify orders / etc _7  - 0 Routine Transfer to another Facility (non-emergent condition) _8  - 0 Routine Hospital Admission (non-emergent condition) X- 1 15 New Admissions / Biomedical engineer / Ordering NPWT Apligraf, etc. , _9  - 0 Emergency Hospital Admission  (emergent condition) PROCESS - Special Needs _10  - 0 Pediatric / Minor Patient Management _11  - 0 Isolation Patient Management _12  - 0 Hearing / Language / Visual special needs _13  - 0 Assessment of Community assistance (transportation, D/C planning, etc.) _14  - 0 Additional assistance / Altered mentation _15  - 0 Support Surface(Harris) Assessment (bed, cushion, seat, etc.) INTERVENTIONS - Miscellaneous _16  -  0 External ear exam _0  - 0 Patient Transfer (multiple staff / Harrel Lemon Lift / Similar devices) _1  - 0 Simple Staple / Suture removal (25 or less) [name], [name] ([id number]) [id number] Page 3 of 9 _2  - 0 Complex Staple / Suture removal (26 or more) _3  - 0 Hypo/Hyperglycemic Management (do not check if billed separately) _4  - 0 Ankle / Brachial Index (ABI) - do not check if billed separately Has the patient been seen at the hospital within the last three years: Yes Total Score: 60 Level Of Care: New/Established - Level 2 Electronic Signature(Harris) Signed: 07/09/2022 3:44:25 PM By: Dana Coria RN Entered By: [name] on 07/09/2022 09:45:37 -------------------------------------------------------------------------------- Encounter Discharge Information Details Patient Name: Date of Service: [name] 07/09/2022 8:45 A M Medical Record Number: [id number] Patient Account Number: [id number] Date of Birth/Sex: Treating RN: 11-27-1940 (81 y.o. [name] Primary Care Sandeep Radell: [name] Other Clinician: Referring Jaceion Aday: Treating Caili Escalera/Extender: [name] Self, Referral Weeks in Treatment: 0 Encounter Discharge Information Items Post Procedure Vitals Discharge Condition: Stable Temperature (F): 97.3 Ambulatory Status: Ambulatory Pulse (bpm): 81 Discharge Destination: Home Respiratory Rate (breaths/min): 18 Transportation: Private Auto Blood Pressure (mmHg): 146/90 Accompanied By: self Schedule Follow-up Appointment:  Yes Clinical Summary of Care: Electronic Signature(Harris) Signed: 07/09/2022 3:44:25 PM By: Dana Coria RN Entered By: [name] on 07/09/2022 09:47:39 -------------------------------------------------------------------------------- Lower Extremity Assessment Details Patient Name: Date of Service: NATASHA, BURDA 07/09/2022 8:45 A M Medical Record Number: [id number] Patient Account Number: [id number] Date of Birth/Sex: Treating RN: 08/26/1940 (81 y.o. [name] Primary Care Johnae Friley: [name] Other Clinician: Referring Pollyann Roa: Treating Yiannis Tulloch/Extender: [name] Self, Referral Weeks in Treatment: 0 Edema Assessment Harris[LeftWEN, [name] ([id number])] [Right: [id number] Page 4 of 9] Assessed: [Left: No] [Right: No] [Left: Edema] [Right: :] Calf Left: Right: Point of Measurement: 33 cm From Medial Instep 28 cm Ankle Left: Right: Point of Measurement: 10 cm From Medial Instep 19 cm Knee To Floor Left: Right: From Medial Instep 44 cm Vascular Assessment Pulses: Dorsalis Pedis Palpable: [Left:Yes] Electronic Signature(Harris) Signed: 07/09/2022 3:44:25 PM By: Dana Coria RN Entered By: [name] on 07/09/2022 09:21:25 -------------------------------------------------------------------------------- Multi Wound Chart Details Patient Name: Date of Service: [name] 07/09/2022 8:45 A M Medical Record Number: [id number] Patient Account Number: [id number] Date of Birth/Sex: Treating RN: [birth year] (81 y.o. [name] Primary Care Tranae Laramie: [name] Other Clinician: Referring Xavious Sharrar: Treating Marisela Line/Extender: [name] Self, Referral Weeks in Treatment: 0 Vital Signs Height(in): 67 Pulse(bpm): 81 Weight(lbs): 113 Blood Pressure(mmHg): 146/90 Body Mass Index(BMI): 17.7 Temperature(F): 97.3 Respiratory Rate(breaths/min): 18 [2:Photos:] [N/A:N/A] Left, Medial Ankle N/A N/A Wound  Location: Gradually Appeared N/A N/A Wounding Event: Venous Leg Ulcer N/A N/A Primary Etiology: Arrhythmia, Congestive Heart Failure, N/A N/A Comorbid History: Hypertension, Received Radiation 06/19/2022 N/A N/A Date Acquired: 0 N/A N/A Weeks of TreatmentELLIEANA, [name] ([id number]) [id number] Page 5 of 9 Open N/A N/A Wound Status: No N/A N/A Wound Recurrence: 0.8x3x0.1 N/A N/A Measurements L x W x D (cm) 1.885 N/A N/A A (cm) : rea 0.188 N/A N/A Volume (cm) : Full Thickness Without Exposed N/A N/A Classification: Support Structures Medium N/A N/A Exudate Amount: Serosanguineous N/A N/A Exudate Type: red, brown N/A N/A Exudate Color: Medium (34-66%) N/A N/A Granulation Amount: Red N/A N/A Granulation Quality: Medium (34-66%) N/A N/A Necrotic Amount: Fat Layer (Subcutaneous Tissue): Yes N/A N/A Exposed Structures: Fascia: No Tendon: No Muscle: No Joint: No Bone: No None N/A N/A Epithelialization: Treatment Notes Electronic Signature(Harris)  Signed: 07/09/2022 3:44:25 PM By: Dana Coria RN Entered By: [name] on 07/09/2022 09:37:37 -------------------------------------------------------------------------------- [place] Details Patient Name: Date of Service: [name], [name] 07/09/2022 8:45 A M Medical Record Number: [id number] Patient Account Number: [id number] Date of Birth/Sex: Treating RN: February 28, 1941 (81 y.o. [name] Primary Care Rashan Rounsaville: [name] Other Clinician: Referring Krystle Polcyn: Treating Grayson Pfefferle/Extender: [name] Self, Referral Weeks in Treatment: 0 Active Inactive Wound/Skin Impairment Nursing Diagnoses: Knowledge deficit related to ulceration/compromised skin integrity Goals: Patient/caregiver will verbalize understanding of skin care regimen Date Initiated: 07/09/2022 Target Resolution Date: 08/09/2022 Goal Status: Active Ulcer/skin breakdown will have a  volume reduction of 30% by week 4 Date Initiated: 07/09/2022 Target Resolution Date: 08/09/2022 Goal Status: Active Ulcer/skin breakdown will have a volume reduction of 50% by week 8 Date Initiated: 07/09/2022 Target Resolution Date: 09/09/2022 Goal Status: Active Ulcer/skin breakdown will have a volume reduction of 80% by week 12 Date Initiated: 07/09/2022 Target Resolution Date: 10/08/2022 Goal Status: Active Ulcer/skin breakdown will heal within 14 weeks Date Initiated: 07/09/2022 Target Resolution Date: 11/08/2022 Goal Status: Active Interventions: LUNABELLA, BADGETT ([id number]) [id number] Page 6 of 9 Assess patient/caregiver ability to obtain necessary supplies Assess patient/caregiver ability to perform ulcer/skin care regimen upon admission and as needed Assess ulceration(Harris) every visit Notes: Electronic Signature(Harris) Signed: 07/09/2022 3:44:25 PM By: Dana Coria RN Entered By: [name] on 07/09/2022 09:46:49 -------------------------------------------------------------------------------- Pain Assessment Details Patient Name: Date of Service: [name], [name] 07/09/2022 8:45 A M Medical Record Number: [id number] Patient Account Number: [id number] Date of Birth/Sex: Treating RN: 09/11/1940 (81 y.o. [name] Primary Care Forrest Jaroszewski: [name] Other Clinician: Referring Jonn Chaikin: Treating Solveig Fangman/Extender: [name] Self, Referral Weeks in Treatment: 0 Active Problems Location of Pain Severity and Description of Pain Patient Has Paino Yes Site Locations With Dressing Change: Yes Duration of the Pain. Constant / Intermittento Intermittent How Long Does it Lasto Hours: Minutes: 10 Rate the pain. Current Pain Level: 0 Worst Pain Level: 3 Least Pain Level: 0 Tolerable Pain Level: 5 Character of Pain Describe the Pain: Shooting, Throbbing Pain Management and Medication Current Pain Management: Medication: Yes Cold  Application: No Rest: Yes Massage: No Activity: No T.E.N.Harris.: No Heat Application: No Leg drop or elevation: No Is the Current Pain Management Adequate: Inadequate How does your wound impact your activities of daily livingo Sleep: Yes Bathing: No Appetite: No Relationship With Others: No Bladder Continence: No Emotions: No Bowel Continence: No Work: No Toileting: No Drive: No Dressing: No Hobbies: No [name], [name] ([id number]) [id number] Page 7 of 9 Electronic Signature(Harris) Signed: 07/09/2022 3:44:25 PM By: Dana Coria RN Entered By: [name] on 07/09/2022 09:08:14 -------------------------------------------------------------------------------- Patient/Caregiver Education Details Patient Name: Date of Service: [name] 12/21/2023andnbsp8:45 A M Medical Record Number: [id number] Patient Account Number: [id number] Date of Birth/Gender: Treating RN: 1940-08-06 (81 y.o. [name] Primary Care Physician: [name] Other Clinician: Referring Physician: Treating Physician/Extender: [name] Self, Referral Weeks in Treatment: 0 Education Assessment Education Provided To: Patient Education Topics Provided Wound/Skin Impairment: Methods: Explain/Verbal Responses: State content correctly Electronic Signature(Harris) Signed: 07/09/2022 3:44:25 PM By: Dana Coria RN Entered By: [name] on 07/09/2022 09:45:55 -------------------------------------------------------------------------------- Wound Assessment Details Patient Name: Date of Service: [name], [name] 07/09/2022 8:45 A M Medical Record Number: [id number] Patient Account Number: [id number] Date of Birth/Sex: Treating RN: 1941-03-02 (81 y.o. [name] Primary Care Llana Deshazo: [name] Other Clinician: Referring Kekoa Fyock: Treating Haroon Shatto/Extender: [name] Self, Referral Weeks in Treatment: 0 Wound Status Wound Number:  2 Primary Venous  Leg Ulcer Etiology: Wound Location: Left, Medial Ankle Wound Status: Open Wounding Event: Gradually Appeared Comorbid Arrhythmia, Congestive Heart Failure, Hypertension, Date Acquired: 06/19/2022 History: Received Radiation Weeks Of Treatment: 0 Clustered Wound: No [name], [name] ([id number]) [id number] Page 8 of 9 Photos Wound Measurements Length: (cm) 0.8 Width: (cm) 3 Depth: (cm) 0.1 Area: (cm) 1.885 Volume: (cm) 0.188 % Reduction in Area: % Reduction in Volume: Epithelialization: None Tunneling: No Undermining: No Wound Description Classification: Full Thickness Without Exposed Suppor Exudate Amount: Medium Exudate Type: Serosanguineous Exudate Color: red, brown t Structures Foul Odor After Cleansing: No Slough/Fibrino Yes Wound Bed Granulation Amount: Medium (34-66%) Exposed Structure Granulation Quality: Red Fascia Exposed: No Necrotic Amount: Medium (34-66%) Fat Layer (Subcutaneous Tissue) Exposed: Yes Necrotic Quality: Adherent Slough Tendon Exposed: No Muscle Exposed: No Joint Exposed: No Bone Exposed: No Treatment Notes Wound #2 (Ankle) Wound Laterality: Left, Medial Cleanser [place] Ancillary Kit - 15 Day Supply Discharge Instruction: Use supplies as instructed; Kit contains: (15) Saline Bullets; (15) 3x3 Gauze; 15 pr Gloves Peri-Wound Care Topical Primary Dressing Secondary Dressing (BORDER) Zetuvit Plus SILICONE BORDER Dressing 4x4 (in/in) Discharge Instruction: Please do not put silicone bordered dressings under wraps. Use non-bordered dressing only. Secured With Tubigrip Size C, 2.75x10 (in/yd) Discharge Instruction: Apply 3 Tubigrip C 3-finger-widths below knee to base of toes to secure dressing and/or for swelling. Compression Wrap Compression Stockings Add-Ons Electronic Signature(Harris) Signed: 07/09/2022 3:44:25 PM By: Dana Coria RN Entered By: [name] on 07/09/2022 09:20:36 [name]  ([id number]) [id number] Page 9 of 9 -------------------------------------------------------------------------------- Vitals Details Patient Name: Date of Service: [name], [name] 07/09/2022 8:45 A M Medical Record Number: [id number] Patient Account Number: [id number] Date of Birth/Sex: Treating RN: 10-May-1941 (81 y.o. [name] Primary Care Gerron Guidotti: [name] Other Clinician: Referring Melisssa Donner: Treating Keller Mikels/Extender: [name] Self, Referral Weeks in Treatment: 0 Vital Signs Time Taken: 09:08 Temperature (F): 97.3 Height (in): 67 Pulse (bpm): 81 Source: Stated Respiratory Rate (breaths/min): 18 Weight (lbs): 113 Blood Pressure (mmHg): 146/90 Source: Stated Reference Range: 80 - 120 mg / dl Body Mass Index (BMI): 17.7 Electronic Signature(Harris) Signed: 07/09/2022 3:44:25 PM By: Dana Coria RN Entered By: [name] on 07/09/2022 09:09:07

## 2022-07-09 NOTE — Progress Notes (Addendum)
KELSY, POLACK ([id number]) [id number] Page 1 of 9 Visit Report for 07/09/2022 Chief Complaint Document Details Patient Name: Date of Service: [name], [name] 07/09/2022 8:45 A M Medical Record Number: [id number] Patient Account Number: [id number] Date of Birth/Sex: Treating RN: [birth year] (81 y.o. [name] Primary Care Provider: [name] Other Clinician: Referring Provider: Treating Provider/Extender: Jeri Cos Self, Referral Weeks in Treatment: 0 Information Obtained from: Patient Chief Complaint Left medial ankle ulcer Electronic Signature(Harris) Signed: 07/09/2022 9:30:11 AM By: Worthy Keeler PA-C Entered By: Worthy Keeler on 07/09/2022 09:30:10 -------------------------------------------------------------------------------- Debridement Details Patient Name: Date of Service: [name], [name] 07/09/2022 8:45 A M Medical Record Number: [id number] Patient Account Number: [id number] Date of Birth/Sex: Treating RN: 1941-05-24 (81 y.o. [name] Primary Care Provider: [name] Other Clinician: Referring Provider: Treating Provider/Extender: Jeri Cos Self, Referral Weeks in Treatment: 0 Debridement Performed for Assessment: Wound #2 Left,Medial Ankle Performed By: Physician Tommie Sams., PA-C Debridement Type: Debridement Severity of Tissue Pre Debridement: Fat layer exposed Level of Consciousness (Pre-procedure): Awake and Alert Pre-procedure Verification/Time Out Yes - 09:40 Taken: Start Time: 09:40 T Area Debrided (L x W): otal 0.8 (cm) x 3 (cm) = 2.4 (cm) Tissue and other material debrided: Viable, Non-Viable, Slough, Subcutaneous, Skin: Dermis , Skin: Epidermis, Slough Level: Skin/Subcutaneous Tissue Debridement Description: Excisional Instrument: Curette Bleeding: Minimum Hemostasis Achieved: Pressure End Time: 09:44 Procedural Pain: 0 Post Procedural Pain: 0 Response to Treatment:  Procedure was tolerated well [name], [name] Harris ([id number]) [id number] Page 2 of 9 Level of Consciousness (Post- Awake and Alert procedure): Post Debridement Measurements of Total Wound Length: (cm) 0.8 Width: (cm) 3 Depth: (cm) 0.1 Volume: (cm) 0.188 Character of Wound/Ulcer Post Debridement: Improved Severity of Tissue Post Debridement: Fat layer exposed Post Procedure Diagnosis Same as Pre-procedure Electronic Signature(Harris) Signed: 07/09/2022 3:44:25 PM By: Carlene Coria RN Signed: 07/09/2022 4:23:52 PM By: Worthy Keeler PA-C Entered By: Carlene Coria on 07/09/2022 09:44:16 -------------------------------------------------------------------------------- HPI Details Patient Name: Date of Service: [name] 07/09/2022 8:45 A M Medical Record Number: [id number] Patient Account Number: [id number] Date of Birth/Sex: Treating RN: 06/03/1941 (81 y.o. [name] Primary Care Provider: [name] Other Clinician: Referring Provider: Treating Provider/Extender: Jeri Cos Self, Referral Weeks in Treatment: 0 History of Present Illness HPI Description: 03-31-2022 upon evaluation today patient appears to be doing decently well all things considered in regard to the wound on her left medial ankle. She tells me this has been coming and going since February. She does have some edema although her leg is not terribly large she has at least 2+ pitting edema on the left leg which is definitely worse in the right leg. She tells me that overall when she made the appointment the wound was larger I did look at notes and records in epic and I did indeed see signs that this was significantly larger than what it is currently when she was first being referred to [place]. Nonetheless at this point he does appear to be doing much better which is great news. Fortunately I do not see any evidence of active infection locally or systemically which is excellent as  well. The patient does have a history of chronic venous insufficiency, paroxysmal atrial fibrillation, long-term use of anticoagulant she is on Eliquis due to the A-fib, hypertension, chronic kidney disease stage III, and congestive heart failure. Fortunately she tells me that most of these issues are very well controlled and she is doing well currently. 04-14-22 upon evaluation today patient  appears to be doing well currently in regard to her wound which in fact appears to be completely healed which is great news. Fortunately I see no evidence of active infection at this time. Readmission: 07-09-2022 upon evaluation today patient presents for initial inspection concerning a reopening of her wound on the left medial ankle region. I actually saw her back in September of this year we got her healed fairly quickly unfortunately this reopened. She did everything that I told her but I think the issue here is simply the fact that she is probably need ongoing compression we were not sure that was going to be the case initially. Patient'Harris past medical history has not changed since September. Electronic Signature(Harris) Signed: 07/09/2022 4:18:04 PM By: Worthy Keeler PA-C Entered By: Worthy Keeler on 07/09/2022 16:18:04 [name] ([id number]) [id number] Page 3 of 9 -------------------------------------------------------------------------------- Physical Exam Details Patient Name: Date of Service: [name], [name] 07/09/2022 8:45 A M Medical Record Number: [id number] Patient Account Number: [id number] Date of Birth/Sex: Treating RN: [birth year] (81 y.o. [name] Primary Care Provider: [name] Other Clinician: Referring Provider: Treating Provider/Extender: Jeri Cos Self, Referral Weeks in Treatment: 0 Constitutional patient is hypertensive.. pulse regular and within target range for patient.Marland Kitchen respirations regular, non-labored and within  target range for patient.Marland Kitchen temperature within target range for patient.. Well-nourished and well-hydrated in no acute distress. Eyes conjunctiva clear no eyelid edema noted. pupils equal round and reactive to light and accommodation. Ears, Nose, Mouth, and Throat no gross abnormality of ear auricles or external auditory canals. normal hearing noted during conversation. mucus membranes moist. Respiratory normal breathing without difficulty. Cardiovascular 2+ dorsalis pedis/posterior tibialis pulses. no clubbing, cyanosis, significant edema, <3 sec cap refill. Musculoskeletal normal gait and posture. no significant deformity or arthritic changes, no loss or range of motion, no clubbing. Psychiatric this patient is able to make decisions and demonstrates good insight into disease process. Alert and Oriented x 3. pleasant and cooperative. Notes Upon inspection patient'Harris wound bed did require some sharp debridement to clearway some of the necrotic debris she tolerated that without complication and postdebridement the wound bed appears to be doing much better. I feel like she is in pretty good shape as far as that is concerned my hope is that she will continue to note improvement going forward. Electronic Signature(Harris) Signed: 07/09/2022 4:18:35 PM By: Worthy Keeler PA-C Entered By: Worthy Keeler on 07/09/2022 16:18:34 -------------------------------------------------------------------------------- Physician Orders Details Patient Name: Date of Service: [name], [name] 07/09/2022 8:45 A M Medical Record Number: [id number] Patient Account Number: [id number] Date of Birth/Sex: Treating RN: 04-01-1941 (81 y.o. [name] Primary Care Provider: [name] Other Clinician: Referring Provider: Treating Provider/Extender: Jeri Cos Self, Referral Weeks in Treatment: 0 Verbal / Phone Orders: No [name], [name] ([id number]) [id number] Page 4 of  9 Diagnosis Coding ICD-10 Coding Code Description [phone] Chronic venous hypertension (idiopathic) with ulcer and inflammation of left lower extremity L97.822 Non-pressure chronic ulcer of other part of left lower leg with fat layer exposed I48.0 Paroxysmal atrial fibrillation Z79.01 Long term (current) use of anticoagulants I10 Essential (primary) hypertension N18.30 Chronic kidney disease, stage 3 unspecified I50.42 Chronic combined systolic (congestive) and diastolic (congestive) heart failure Follow-up Appointments Return Appointment in 2 weeks. Bathing/ Shower/ Hygiene May shower; gently cleanse wound with antibacterial soap, rinse and pat dry prior to dressing wounds Edema Control - Lymphedema / Segmental Compressive Device / Other Elevate, Exercise Daily and A void Standing for Long Periods of  Time. Elevate legs to the level of the heart and pump ankles as often as possible Elevate leg(Harris) parallel to the floor when sitting. Wound Treatment Wound #2 - Ankle Wound Laterality: Left, Medial Cleanser: Byram Ancillary Kit - 15 Day Supply (DME) (Generic) 3 x Per Week/30 Days Discharge Instructions: Use supplies as instructed; Kit contains: (15) Saline Bullets; (15) 3x3 Gauze; 15 pr Gloves Prim Dressing: Xeroform-HBD 2x2 (in/in) 3 x Per Week/30 Days ary Discharge Instructions: Apply Xeroform-HBD 2x2 (in/in) as directed Secondary Dressing: (BORDER) Zetuvit Plus SILICONE BORDER Dressing 4x4 (in/in) (DME) (Generic) 3 x Per Week/30 Days Discharge Instructions: Please do not put silicone bordered dressings under wraps. Use non-bordered dressing only. Secured With: Tubigrip Size C, 2.75x10 (in/yd) (Generic) 3 x Per Week/30 Days Discharge Instructions: Apply 3 Tubigrip C 3-finger-widths below knee to base of toes to secure dressing and/or for swelling. Electronic Signature(Harris) Signed: 07/14/2022 4:50:51 PM By: Carlene Coria RN Signed: 07/15/2022 11:18:32 AM By: Worthy Keeler PA-C Previous  Signature: 07/09/2022 3:44:25 PM Version By: Carlene Coria RN Previous Signature: 07/09/2022 4:23:52 PM Version By: Worthy Keeler PA-C Entered By: Carlene Coria on 07/14/2022 16:49:41 -------------------------------------------------------------------------------- Problem List Details Patient Name: Date of Service: [name] 07/09/2022 8:45 A M Medical Record Number: [id number] Patient Account Number: [id number] Date of Birth/Sex: Treating RN: 1941/04/10 (81 y.o. [name] Primary Care Provider: [name] Other Clinician: Referring Provider: Treating Provider/Extender: Jeri Cos Self, Referral Weeks in Treatment: 0 Active Problems ICD-10 Encounter Code Description Active Date MDM Diagnosis [name], [name] ([id number]) [id number] Page 5 of 9 [phone] Chronic venous hypertension (idiopathic) with ulcer and inflammation of left 07/09/2022 No Yes lower extremity L97.822 Non-pressure chronic ulcer of other part of left lower leg with fat layer exposed12/21/2023 No Yes I48.0 Paroxysmal atrial fibrillation 07/09/2022 No Yes Z79.01 Long term (current) use of anticoagulants 07/09/2022 No Yes I10 Essential (primary) hypertension 07/09/2022 No Yes N18.30 Chronic kidney disease, stage 3 unspecified 07/09/2022 No Yes I50.42 Chronic combined systolic (congestive) and diastolic (congestive) heart failure 07/09/2022 No Yes Inactive Problems Resolved Problems Electronic Signature(Harris) Signed: 07/09/2022 9:30:01 AM By: Worthy Keeler PA-C Entered By: Worthy Keeler on 07/09/2022 09:30:01 -------------------------------------------------------------------------------- Progress Note Details Patient Name: Date of Service: [name] 07/09/2022 8:45 A M Medical Record Number: [id number] Patient Account Number: [id number] Date of Birth/Sex: Treating RN: 11-27-1940 (81 y.o. [name] Primary Care Provider: [name] Other  Clinician: Referring Provider: Treating Provider/Extender: Jeri Cos Self, Referral Weeks in Treatment: 0 Subjective Chief Complaint Information obtained from Patient Left medial ankle ulcer History of Present Illness (HPI) 03-31-2022 upon evaluation today patient appears to be doing decently well all things considered in regard to the wound on her left medial ankle. She tells me this has been coming and going since February. She does have some edema although her leg is not terribly large she has at least 2+ pitting edema on the left leg which is definitely worse in the right leg. She tells me that overall when she made the appointment the wound was larger I did look at notes and records in epic and I did indeed see signs that this was significantly larger than what it is currently when she was first being referred to [place]. Nonetheless at this point he does appear to be doing much better which is great news. Fortunately I do not see any evidence of active infection locally or systemically which is excellent as well. The patient does have a history of chronic venous  insufficiency, paroxysmal atrial fibrillation, long-term use of anticoagulant she is on Eliquis due to the [name], [name] ([id number]) [id number] Page 6 of 9 hypertension, chronic kidney disease stage III, and congestive heart failure. Fortunately she tells me that most of these issues are very well controlled and she is doing well currently. 04-14-22 upon evaluation today patient appears to be doing well currently in regard to her wound which in fact appears to be completely healed which is great news. Fortunately I see no evidence of active infection at this time. Readmission: 07-09-2022 upon evaluation today patient presents for initial inspection concerning a reopening of her wound on the left medial ankle region. I actually saw her back in September of this year we got her healed fairly  quickly unfortunately this reopened. She did everything that I told her but I think the issue here is simply the fact that she is probably need ongoing compression we were not sure that was going to be the case initially. Patient'Harris past medical history has not changed since September. Patient History Allergies No Known Allergies Social History Former smoker, Marital Status - Widowed, Alcohol Use - Moderate, Drug Use - No History, Caffeine Use - Daily. Medical History Cardiovascular Patient has history of Arrhythmia, Congestive Heart Failure, Hypertension Oncologic Patient has history of Received Radiation - breast 2008 Objective Constitutional patient is hypertensive.. pulse regular and within target range for patient.Marland Kitchen respirations regular, non-labored and within target range for patient.Marland Kitchen temperature within target range for patient.. Well-nourished and well-hydrated in no acute distress. Vitals Time Taken: 9:08 AM, Height: 67 in, Source: Stated, Weight: 113 lbs, Source: Stated, BMI: 17.7, Temperature: 97.3 F, Pulse: 81 bpm, Respiratory Rate: 18 breaths/min, Blood Pressure: 146/90 mmHg. Eyes conjunctiva clear no eyelid edema noted. pupils equal round and reactive to light and accommodation. Ears, Nose, Mouth, and Throat no gross abnormality of ear auricles or external auditory canals. normal hearing noted during conversation. mucus membranes moist. Respiratory normal breathing without difficulty. Cardiovascular 2+ dorsalis pedis/posterior tibialis pulses. no clubbing, cyanosis, significant edema, Musculoskeletal normal gait and posture. no significant deformity or arthritic changes, no loss or range of motion, no clubbing. Psychiatric this patient is able to make decisions and demonstrates good insight into disease process. Alert and Oriented x 3. pleasant and cooperative. General Notes: Upon inspection patient'Harris wound bed did require some sharp debridement to clearway some of the  necrotic debris she tolerated that without complication and postdebridement the wound bed appears to be doing much better. I feel like she is in pretty good shape as far as that is concerned my hope is that she will continue to note improvement going forward. Integumentary (Hair, Skin) Wound #2 status is Open. Original cause of wound was Gradually Appeared. The date acquired was: 06/19/2022. The wound is located on the Left,Medial Ankle. The wound measures 0.8cm length x 3cm width x 0.1cm depth; 1.885cm^2 area and 0.188cm^3 volume. There is Fat Layer (Subcutaneous Tissue) exposed. There is no tunneling or undermining noted. There is a medium amount of serosanguineous drainage noted. There is medium (34-66%) red granulation within the wound bed. There is a medium (34-66%) amount of necrotic tissue within the wound bed including Adherent Slough. 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 There was a Excisional Skin/Subcutaneous Tissue Debridement with a total area of 2.4 sq cm performed by Tommie Sams., PA-C. With the following instrument(Harris): Curette to remove Viable and Non-Viable tissue/material. Material removed includes Subcutaneous Tissue, Slough, Skin: Dermis, and Skin: Epidermis. No specimens were taken. A time out was conducted at 09:40, prior to  the start of the procedure. A Minimum amount of bleeding was controlled with Pressure. The procedure was tolerated well with a pain level of 0 throughout and a pain level of 0 following the procedure. Post Debridement Measurements: 0.8cm length x 3cm width x 0.1cm depth; 0.188cm^3 volume. Character of Wound/Ulcer Post Debridement is improved. Severity of Tissue Post Debridement is: Fat layer exposed. Post procedure Diagnosis Wound #2: Same as Pre-Procedure Plan Follow-up Appointments: Return Appointment in 2 weeks. Bathing/ Shower/ Hygiene: May shower; gently cleanse wound with antibacterial soap, rinse and pat dry prior to dressing wounds Edema Control - Lymphedema / Segmental Compressive Device / Other: Elevate, Exercise Daily and Avoid Standing for Long Periods of Time. Elevate legs to the level of the heart and pump ankles as often as possible Elevate leg(Harris) parallel to the floor when sitting. WOUND #2: - Ankle Wound Laterality: Left, Medial Cleanser: Byram Ancillary Kit - 15 Day Supply (DME) (Generic) 3 x Per Week/30 Days Discharge Instructions: Use supplies as instructed; Kit contains: (15) Saline Bullets; (15) 3x3 Gauze; 15 pr Gloves Secondary Dressing: (BORDER) Zetuvit Plus SILICONE BORDER Dressing 4x4 (in/in) (DME) (Generic) 3 x Per Week/30 Days Discharge Instructions: Please do not put silicone bordered dressings under wraps. Use non-bordered dressing only. Secured With: Tubigrip Size C, 2.75x10 (in/yd) (Generic) 3 x Per Week/30 Days Discharge Instructions: Apply 3 Tubigrip C 3-finger-widths below knee to base of toes to secure dressing and/or for swelling. 1. I am good recommend currently that we go ahead and reinitiate treatment with Xeroform gauze and the Tubigrip for the time being. Again I think long-term Tubigrip could be an option for her although I think compression socks could also be beneficial although she is unsure how easy is going to be to get them on due to the  arthritis in her hands which I completely understand. Overall though I feel like that she is going require something ongoing in order to keep things under control. 2. I am going to recommend as well that we use elevation as a day adjunctive to the compression currently they were using with the Tubigrip I think that when she is sitting if she can elevate she will definitely see some improvements as far as this is concerned as well. We will see patient back for reevaluation in 2 weeks here in the clinic. If anything worsens or changes patient will contact our office for additional recommendations. Electronic Signature(Harris) Signed: 07/09/2022 4:19:43 PM By: Worthy Keeler PA-C Entered By: Worthy Keeler on 07/09/2022 16:19:43 -------------------------------------------------------------------------------- ROS/PFSH Details Patient Name: Date of Service: [name], [name] 07/09/2022 8:45 A M Medical Record Number: [id number] Patient Account Number: [id number] Date of Birth/Sex: Treating RN: 25-Apr-1941 (81 y.o. [name] Primary Care Provider: [name] Other Clinician: Referring Provider: Treating Provider/Extender: Jeri Cos Self, Referral Weeks in TreatmentEVONE, [name] ([id number]) [id number] Page 8 of 9 Cardiovascular Medical History: Positive for: Arrhythmia; Congestive Heart  Failure; Hypertension Oncologic Medical History: Positive for: Received Radiation - breast 2008 Immunizations Pneumococcal Vaccine: Received Pneumococcal Vaccination: Yes Received Pneumococcal Vaccination On or After 60th Birthday: Yes Implantable Devices None Family and Social History Former smoker; Marital Status - Widowed; Alcohol Use: Moderate; Drug Use: No History; Caffeine Use: Daily Electronic Signature(Harris) Signed: 07/09/2022 3:44:25 PM By: Carlene Coria RN Signed: 07/09/2022 4:23:52 PM By: Worthy Keeler PA-C Entered By: Carlene Coria on  07/09/2022 09:10:08 -------------------------------------------------------------------------------- SuperBill Details Patient Name: Date of Service: [name] 07/09/2022 Medical Record Number: [id number] Patient Account Number: [id number] Date of Birth/Sex: Treating RN: 04/28/1941 (81 y.o. [name] Primary Care Provider: [name] Other Clinician: Referring Provider: Treating Provider/Extender: Jeri Cos Self, Referral Weeks in Treatment: 0 Diagnosis Coding ICD-10 Codes Code Description [phone] Chronic venous hypertension (idiopathic) with ulcer and inflammation of left lower extremity L97.822 Non-pressure chronic ulcer of other part of left lower leg with fat layer exposed I48.0 Paroxysmal atrial fibrillation Z79.01 Long term (current) use of anticoagulants I10 Essential (primary) hypertension N18.30 Chronic kidney disease, stage 3 unspecified I50.42 Chronic combined systolic (congestive) and diastolic (congestive) heart failure Facility Procedures : CPT4 Code: [id number] Description: 77654 - WOUND CARE VISIT-LEV 2 EST PT Modifier: Quantity: 1 Physician Procedures : CPT4 Code Description Modifier [id number] - WC PHYS LEVEL 3 - EST PT ICD-10 Diagnosis Description I87.332 Chronic venous hypertension (idiopathic) with ulcer and inflammation of left lower extremity L97.822 Non-pressure chronic ulcer of other part  of left lower leg with fat layer exposed I48.0 Paroxysmal atrial fibrillation Z79.01 Long term (current) use of anticoagulants Quantity: 1 : [id number] 11042 - WC PHYS SUBQ TISS 20 SQ CM ICD-10 Diagnosis Description L97.822 Non-pressure chronic ulcer of other part of left lower leg with fat layer exposed Quantity: 1 Electronic Signature(Harris) Signed: 07/09/2022 4:21:47 PM By: Worthy Keeler PA-C Previous Signature: 07/09/2022 3:44:25 PM Version By: Carlene Coria RN Entered By: Worthy Keeler on 07/09/2022 16:21:47

## 2022-07-16 ENCOUNTER — Ambulatory Visit (INDEPENDENT_AMBULATORY_CARE_PROVIDER_SITE_OTHER): Payer: Medicare Other | Admitting: Podiatry

## 2022-07-16 DIAGNOSIS — I872 Venous insufficiency (chronic) (peripheral): Secondary | ICD-10-CM | POA: Diagnosis not present

## 2022-07-16 DIAGNOSIS — M2041 Other hammer toe(s) (acquired), right foot: Secondary | ICD-10-CM | POA: Diagnosis not present

## 2022-07-16 DIAGNOSIS — B351 Tinea unguium: Secondary | ICD-10-CM | POA: Diagnosis not present

## 2022-07-16 DIAGNOSIS — D689 Coagulation defect, unspecified: Secondary | ICD-10-CM

## 2022-07-16 DIAGNOSIS — M2042 Other hammer toe(s) (acquired), left foot: Secondary | ICD-10-CM

## 2022-07-16 DIAGNOSIS — M79676 Pain in unspecified toe(s): Secondary | ICD-10-CM

## 2022-07-16 NOTE — Progress Notes (Signed)
This patient returns to my office for at risk foot care.  This patient requires this care by a professional since this patient will be at risk due to having lymphedema.  This patient is unable to cut nails herself since the patient cannot reach her nails.These nails are painful walking and wearing shoes.  This patient presents for at risk foot care today.  General Appearance  Alert, conversant and in no acute stress.  Vascular  Dorsalis pedis and posterior tibial  pulses are palpable  bilaterally.  Capillary return is within normal limits  bilaterally. Temperature is within normal limits  bilaterally.  Neurologic  Senn-Weinstein monofilament wire test within normal limits  bilaterally. Muscle power within normal limits bilaterally.  Nails Thick disfigured discolored nails with subungual debris  from hallux to fifth toes bilaterally. No evidence of bacterial infection or drainage bilaterally.  Orthopedic  No limitations of motion  feet .  No crepitus or effusions noted.  No bony pathology or digital deformities noted.  HAV  B/L.  Mallet toe third toe left foot.  Skin  normotropic skin with no porokeratosis noted bilaterally.  No signs of infections or ulcers noted.     Onychomycosis  Pain in right toes  Pain in left toes  Consent was obtained for treatment procedures.   Mechanical debridement of nails 1-5  bilaterally performed with a nail nipper.  Filed with dremel without incident.     Return office visit    3 months                  Told patient to return for periodic foot care and evaluation due to potential at risk complications.   Gardiner Barefoot DPM

## 2022-07-23 ENCOUNTER — Other Ambulatory Visit: Payer: Self-pay | Admitting: Cardiology

## 2022-07-24 ENCOUNTER — Encounter: Payer: Medicare Other | Attending: Physician Assistant | Admitting: Physician Assistant

## 2022-07-24 DIAGNOSIS — I130 Hypertensive heart and chronic kidney disease with heart failure and stage 1 through stage 4 chronic kidney disease, or unspecified chronic kidney disease: Secondary | ICD-10-CM | POA: Insufficient documentation

## 2022-07-24 DIAGNOSIS — I480 Paroxysmal atrial fibrillation: Secondary | ICD-10-CM | POA: Insufficient documentation

## 2022-07-24 DIAGNOSIS — L97322 Non-pressure chronic ulcer of left ankle with fat layer exposed: Secondary | ICD-10-CM | POA: Diagnosis not present

## 2022-07-24 DIAGNOSIS — L97822 Non-pressure chronic ulcer of other part of left lower leg with fat layer exposed: Secondary | ICD-10-CM | POA: Insufficient documentation

## 2022-07-24 DIAGNOSIS — I5042 Chronic combined systolic (congestive) and diastolic (congestive) heart failure: Secondary | ICD-10-CM | POA: Insufficient documentation

## 2022-07-24 DIAGNOSIS — N183 Chronic kidney disease, stage 3 unspecified: Secondary | ICD-10-CM | POA: Insufficient documentation

## 2022-07-24 DIAGNOSIS — I87332 Chronic venous hypertension (idiopathic) with ulcer and inflammation of left lower extremity: Secondary | ICD-10-CM | POA: Diagnosis not present

## 2022-07-24 DIAGNOSIS — Z7901 Long term (current) use of anticoagulants: Secondary | ICD-10-CM | POA: Diagnosis not present

## 2022-07-24 NOTE — Progress Notes (Addendum)
[name], [name] ([id number]) [id number] Page 1 of 7 Visit Report for 07/24/2022 Arrival Information Details Patient Name: Date of Service: [name], [name] 07/24/2022 9:00 A M Medical Record Number: [id number] Patient Account Number: [id number] Date of Birth/Sex: Treating RN: [birth year] (82 y.o. [name] Primary Care Ivoree Felmlee: [name] Other Clinician: Referring Consetta Cosner: Treating Kellie Murrill/Extender: Cecelia Byars in Treatment: 2 Visit Information History Since Last Visit Added or deleted any medications: No Patient Arrived: Ambulatory Any new allergies or adverse reactions: No Arrival Time: 09:23 Had a fall or experienced change in No Accompanied By: self activities of daily living that may affect Transfer Assistance: None risk of falls: Patient Identification Verified: Yes Signs or symptoms of abuse/neglect since last visito No Secondary Verification Process Completed: Yes Hospitalized since last visit: No Patient Requires Transmission-Based Precautions: No Implantable device outside of the clinic excluding No Patient Has Alerts: Yes cellular tissue based products placed in the center Patient Alerts: Patient on Blood Thinner since last visit: ABI L 1.22 03/31/22 Has Dressing in Place as Prescribed: Yes Pain Present Now: No Electronic Signature(s) Signed: 07/24/2022 12:23:06 PM By: Carlene Coria RN Entered By: Carlene Coria on 07/24/2022 09:24:16 -------------------------------------------------------------------------------- Clinic Level of Care Assessment Details Patient Name: Date of Service: [name], [name] 07/24/2022 9:00 A M Medical Record Number: [id number] Patient Account Number: [id number] Date of Birth/Sex: Treating RN: June 01, 1941 (82 y.o. [name] Primary Care Billi Bright: [name] Other Clinician: Referring Yailene Badia: Treating Chao Blazejewski/Extender: Cecelia Byars in  Treatment: 2 Clinic Level of Care Assessment Items TOOL 4 Quantity Score X- 1 0 Use when only an EandM is performed on FOLLOW-UP visit ASSESSMENTS - Nursing Assessment / Reassessment X- 1 10 Reassessment of Co-morbidities (includes updates in patient status) X- 1 5 Reassessment of Adherence to Treatment Plan ASSESSMENTS - Wound and Skin A ssessment / Reassessment X - Simple Wound Assessment / Reassessment - one wound 1 5 []  - 0 Complex Wound Assessment / Reassessment - multiple wounds []  - 0 Dermatologic / Skin Assessment (not related to wound area) ASSESSMENTS - Focused Assessment []  - 0 Circumferential Edema Measurements - multi extremities []  - 0 Nutritional Assessment / Counseling / Intervention []  - 0 Lower Extremity Assessment (monofilament, tuning fork, pulses) []  - 0 Peripheral Arterial Disease Assessment (using hand held doppler) ASSESSMENTS - Ostomy and/or Continence Assessment and Care []  - 0 Incontinence Assessment and Management []  - 0 Ostomy Care Assessment and Management (repouching, etc.) PROCESS - Coordination of Care X - Simple Patient / Family Education for ongoing care 1 [address] [place] ([id number]) [id number] Page 2 of 7 []  - 0 Complex (extensive) Patient / Family Education for ongoing care []  - 0 Staff obtains Programmer, systems, Records, T Results / Process Orders est []  - 0 Staff telephones HHA, Nursing Homes / Clarify orders / etc []  - 0 Routine Transfer to another Facility (non-emergent condition) []  - 0 Routine Hospital Admission (non-emergent condition) []  - 0 New Admissions / Biomedical engineer / Ordering NPWT Apligraf, etc. , []  - 0 Emergency Hospital Admission (emergent condition) X- 1 10 Simple Discharge Coordination []  - 0 Complex (extensive) Discharge Coordination PROCESS - Special Needs []  - 0 Pediatric / Minor Patient Management []  - 0 Isolation Patient Management []  - 0 Hearing / Language /  Visual special needs []  - 0 Assessment of Community assistance (transportation, D/C planning, etc.) []  - 0 Additional assistance / Altered mentation []  - 0 Support Surface(s) Assessment (bed, cushion, seat, etc.) INTERVENTIONS -  Wound Cleansing / Measurement X - Simple Wound Cleansing - one wound 1 5 []  - 0 Complex Wound Cleansing - multiple wounds X- 1 5 Wound Imaging (photographs - any number of wounds) []  - 0 Wound Tracing (instead of photographs) X- 1 5 Simple Wound Measurement - one wound []  - 0 Complex Wound Measurement - multiple wounds INTERVENTIONS - Wound Dressings X - Small Wound Dressing one or multiple wounds 1 10 []  - 0 Medium Wound Dressing one or multiple wounds []  - 0 Large Wound Dressing one or multiple wounds []  - 0 Application of Medications - topical []  - 0 Application of Medications - injection INTERVENTIONS - Miscellaneous []  - 0 External ear exam []  - 0 Specimen Collection (cultures, biopsies, blood, body fluids, etc.) []  - 0 Specimen(s) / Culture(s) sent or taken to Lab for analysis []  - 0 Patient Transfer (multiple staff / Civil Service fast streamer / Similar devices) []  - 0 Simple Staple / Suture removal (25 or less) []  - 0 Complex Staple / Suture removal (26 or more) []  - 0 Hypo / Hyperglycemic Management (close monitor of Blood Glucose) []  - 0 Ankle / Brachial Index (ABI) - do not check if billed separately X- 1 5 Vital Signs Has the patient been seen at the hospital within the last three years: Yes Total Score: 75 Level Of Care: New/Established - Level 2 Electronic Signature(s) Signed: 07/30/2022 3:13:07 PM By: Carlene Coria RN Entered By: Carlene Coria on 07/30/2022 13:29:11 [name] [name] ([id number]) [id number] Page 3 of 7 -------------------------------------------------------------------------------- Encounter Discharge Information Details Patient Name: Date of Service: [name], [name] 07/24/2022 9:00 A  M Medical Record Number: [id number] Patient Account Number: [id number] Date of Birth/Sex: Treating RN: [birth year] (82 y.o. [name] Primary Care Caragh Gasper: [name] Other Clinician: Referring Rylen Hou: Treating Nancy Manuele/Extender: Cecelia Byars in Treatment: 2 Encounter Discharge Information Items Discharge Condition: Stable Ambulatory Status: Ambulatory Discharge Destination: Home Transportation: Private Auto Accompanied By: self Schedule Follow-up Appointment: Yes Clinical Summary of Care: Electronic Signature(s) Signed: 07/30/2022 1:30:21 PM By: Carlene Coria RN Entered By: Carlene Coria on 07/30/2022 13:30:21 -------------------------------------------------------------------------------- Lower Extremity Assessment Details Patient Name: Date of Service: [name], [name] 07/24/2022 9:00 A M Medical Record Number: [id number] Patient Account Number: [id number] Date of Birth/Sex: Treating RN: 12-Dec-1940 (82 y.o. [name] Primary Care Deshundra Waller: [name] Other Clinician: Referring Cote Mayabb: Treating Virdell Hoiland/Extender: Cecelia Byars in Treatment: 2 Edema Assessment Assessed: [Left: No] [Right: No] Edema: [Left: Ye] [Right: s] Calf Left: Right: Point of Measurement: 33 cm From Medial Instep 28 cm Ankle Left: Right: Point of Measurement: 10 cm From Medial Instep 19 cm Knee To Floor Left: Right: From Medial Instep 44 cm Vascular Assessment Pulses: Dorsalis Pedis Palpable: [Left:Yes] Electronic Signature(s) Signed: 07/24/2022 12:23:06 PM By: Carlene Coria RN Entered By: Carlene Coria on 07/24/2022 09:32:22 -------------------------------------------------------------------------------- Multi Wound Chart Details Patient Name: Date of Service: [name] [name] 07/24/2022 9:00 A Darcey Nora, Terisa Starr ([id number]) [id number] Page 4 of 7 Medical Record Number: [id number] Patient  Account Number: [id number] Date of Birth/Sex: Treating RN: 1941-06-15 (82 y.o. [name] Primary Care Christianjames Soule: [name] Other Clinician: Referring Dominic Rhome: Treating Josyah Achor/Extender: Cecelia Byars in Treatment: 2 Vital Signs Height(in): 67 Pulse(bpm): 2 Weight(lbs): 113 Blood Pressure(mmHg): 151/81 Body Mass Index(BMI): 17.7 Temperature(F): 97.3 Respiratory Rate(breaths/min): 18 [2:Photos:] [N/A:N/A] Left, Medial Ankle N/A N/A Wound Location: Gradually Appeared N/A N/A Wounding Event: Venous Leg Ulcer N/A N/A Primary Etiology: Arrhythmia, Congestive Heart Failure, N/A  N/A Comorbid History: Hypertension, Received Radiation 06/19/2022 N/A N/A Date Acquired: 2 N/A N/A Weeks of Treatment: Open N/A N/A Wound Status: No N/A N/A Wound Recurrence: 0.7x0.3x0.1 N/A N/A Measurements L x W x D (cm) 0.165 N/A N/A A (cm) : rea 0.016 N/A N/A Volume (cm) : 91.20% N/A N/A % Reduction in Area: 91.50% N/A N/A % Reduction in Volume: Full Thickness Without Exposed N/A N/A Classification: Support Structures Medium N/A N/A Exudate Amount: Serosanguineous N/A N/A Exudate Type: red, brown N/A N/A Exudate Color: Medium (34-66%) N/A N/A Granulation Amount: Red N/A N/A Granulation Quality: Medium (34-66%) N/A N/A Necrotic Amount: Fat Layer (Subcutaneous Tissue): Yes N/A N/A Exposed Structures: Fascia: No Tendon: No Muscle: No Joint: No Bone: No None N/A N/A Epithelialization: Treatment Notes Electronic Signature(s) Signed: 07/24/2022 12:23:06 PM By: Carlene Coria RN Entered By: Carlene Coria on 07/24/2022 09:32:56 -------------------------------------------------------------------------------- Multi-Disciplinary Care Plan Details Patient Name: Date of Service: [name] [name] 07/24/2022 9:00 A M Medical Record Number: [id number] Patient Account Number: [id number] Date of Birth/Sex: Treating RN: [birth year] (82 y.o. [name] Primary Care Davi Rotan: [name] Other Clinician: Referring Shelva Hetzer: Treating Moriah Shawley/Extender: Cecelia Byars in Treatment: 2 Active Inactive KARRON, KOVACEVIC ([id number]) [phone].pdf Page 5 of 7 Electronic Signature(s) Signed: 08/27/2022 10:09:13 AM By: Gretta Cool, BSN, RN, CWS, Kim RN, BSN Signed: 10/13/2022 8:44:08 AM By: Carlene Coria RN Previous Signature: 07/30/2022 1:29:38 PM Version By: Carlene Coria RN Entered By: Gretta Cool BSN, RN, CWS, Kim on 08/27/2022 10:09:13 -------------------------------------------------------------------------------- Pain Assessment Details Patient Name: Date of Service: KEELEE, [name] 07/24/2022 9:00 [place] Record Number: [id number] Patient Account Number: [id number] Date of Birth/Sex: Treating RN: [birth year] (81 y.o. [name] Primary Care Maddyx Vallie: [name] Other Clinician: Referring Rayquan Amrhein: Treating Drelyn Pistilli/Extender: Cecelia Byars in Treatment: 2 Active Problems Location of Pain Severity and Description of Pain Patient Has Paino No Site Locations Pain Management and Medication Current Pain Management: Electronic Signature(s) Signed: 07/24/2022 12:23:06 PM By: Carlene Coria RN Entered By: Carlene Coria on 07/24/2022 09:25:17 -------------------------------------------------------------------------------- Patient/Caregiver Education Details Patient Name: Date of Service: [name] [name] 1/5/2024andnbsp9:00 [place] Record Number: [id number] Patient Account Number: [id number] Date of Birth/Gender: Treating RN: 17-Aug-1940 (82 y.o. [name] Primary Care Physician: [name] Other Clinician: Referring Physician: Treating Physician/Extender: Cecelia Byars in Treatment: 2 Education Assessment Education Provided To: Patient Education Topics Provided Wound/Skin Impairment: Methods:  Explain/Verbal Responses: State content correctly [name], [name] ([id number]) [id number] Page 6 of 7 Electronic Signature(s) Signed: 07/30/2022 3:13:07 PM By: Carlene Coria RN Entered By: Carlene Coria on 07/30/2022 13:29:28 -------------------------------------------------------------------------------- Wound Assessment Details Patient Name: Date of Service: [name], [name] 07/24/2022 9:00 A M Medical Record Number: [id number] Patient Account Number: [id number] Date of Birth/Sex: Treating RN: Jun 06, 1941 (82 y.o. [name] Primary Care Hanh Kertesz: [name] Other Clinician: Referring Parth Mccormac: Treating Natori Gudino/Extender: Cecelia Byars in Treatment: 2 Wound Status Wound Number: 2 Primary Venous Leg Ulcer Etiology: Wound Location: Left, Medial Ankle Wound Status: Open Wounding Event: Gradually Appeared Comorbid Arrhythmia, Congestive Heart Failure, Hypertension, Date Acquired: 06/19/2022 History: Received Radiation Weeks Of Treatment: 2 Clustered Wound: No Photos Wound Measurements Length: (cm) 0.7 Width: (cm) 0.3 Depth: (cm) 0.1 Area: (cm) 0.165 Volume: (cm) 0.016 % Reduction in Area: 91.2% % Reduction in Volume: 91.5% Epithelialization: None Tunneling: No Undermining: No Wound Description Classification: Full Thickness Without Exposed Suppor Exudate Amount: Medium Exudate Type: Serosanguineous Exudate Color: red, brown t Structures Foul Odor After  Cleansing: No Slough/Fibrino Yes Wound Bed Granulation Amount: Medium (34-66%) Exposed Structure Granulation Quality: Red Fascia Exposed: No Necrotic Amount: Medium (34-66%) Fat Layer (Subcutaneous Tissue) Exposed: Yes Necrotic Quality: Adherent Slough Tendon Exposed: No Muscle Exposed: No Joint Exposed: No Bone Exposed: No Electronic Signature(s) Signed: 07/24/2022 12:23:06 PM By: Carlene Coria RN Entered By: Carlene Coria on 07/24/2022  09:32:42 -------------------------------------------------------------------------------- [facility] [place] Details Patient Name: Date of Service: [name] [name] 07/24/2022 9:00 A M Medical Record Number: [id number] Patient Account Number: [id number] Date of Birth/Sex: Treating RN: 1941/02/12 (82 y[address]o. [address]Alyonna, Ko[place], [name] ([id number]) [phone].pdf Page 7 of 7 Primary Care Majestic Brister: [name] Other Clinician: Referring Miia Blanks: Treating Tamon Parkerson/Extender: Cecelia Byars in Treatment: 2 Vital Signs Time Taken: 09:24 Temperature (F): 97.3 Height (in): 67 Pulse (bpm): 79 Weight (lbs): 113 Respiratory Rate (breaths/min): 18 Body Mass Index (BMI): 17.7 Blood Pressure (mmHg): 151/81 Reference Range: 80 - 120 mg / dl Electronic Signature(s) Signed: 07/24/2022 12:23:06 PM By: Carlene Coria RN Entered By: Carlene Coria on 07/24/2022 09:25:10

## 2022-07-24 NOTE — Progress Notes (Addendum)
CHANE, MAGNER ([id number]) [id number] Page 1 of 6 Visit Report for 07/24/2022 Chief Complaint Document Details Patient Name: Date of Service: [name], [name] 07/24/2022 9:00 A M Medical Record Number: [id number] Patient Account Number: [id number] Date of Birth/Sex: Treating RN: 1941-01-17 (82 y.o. [name] Primary Care Provider: [name] Other Clinician: Referring Provider: Treating Provider/Extender: Cecelia Byars in Treatment: 2 Information Obtained from: Patient Chief Complaint Left medial ankle ulcer Electronic Signature(s) Signed: 07/24/2022 9:41:01 AM By: Worthy Keeler PA-C Entered By: Worthy Keeler on 07/24/2022 09:41:01 -------------------------------------------------------------------------------- HPI Details Patient Name: Date of Service: [name] 07/24/2022 9:00 A M Medical Record Number: [id number] Patient Account Number: [id number] Date of Birth/Sex: Treating RN: [birth year] (82 y.o. [name] Primary Care Provider: [name] Other Clinician: Referring Provider: Treating Provider/Extender: Cecelia Byars in Treatment: 2 History of Present Illness HPI Description: 03-31-2022 upon evaluation today patient appears to be doing decently well all things considered in regard to the wound on her left medial ankle. She tells me this has been coming and going since February. She does have some edema although her leg is not terribly large she has at least 2+ pitting edema on the left leg which is definitely worse in the right leg. She tells me that overall when she made the appointment the wound was larger I did look at notes and records in epic and I did indeed see signs that this was significantly larger than what it is currently when she was first being referred to [place]. Nonetheless at this point he does appear to be doing much better which is great news. Fortunately I  do not see any evidence of active infection locally or systemically which is excellent as well. The patient does have a history of chronic venous insufficiency, paroxysmal atrial fibrillation, long-term use of anticoagulant she is on Eliquis due to the A-fib, hypertension, chronic kidney disease stage III, and congestive heart failure. Fortunately she tells me that most of these issues are very well controlled and she is doing well currently. 04-14-22 upon evaluation today patient appears to be doing well currently in regard to her wound which in fact appears to be completely healed which is great news. Fortunately I see no evidence of active infection at this time. Readmission: 07-09-2022 upon evaluation today patient presents for initial inspection concerning a reopening of her wound on the left medial ankle region. I actually saw her [name], [name] ([id number]) [id number] Page 2 of 6 back in September of this year we got her healed fairly quickly unfortunately this reopened. She did everything that I told her but I think the issue here is simply the fact that she is probably need ongoing compression we were not sure that was going to be the case initially. Patient's past medical history has not changed since September. 07-24-2022 upon evaluation today patient appears to be doing well currently in regard to her wound which is actually showing signs of excellent improvement. I am very pleased with where things stand I do believe that she is making good progress here. I do not see any signs of active infection locally nor systemically which is great news. Electronic Signature(s) Signed: 07/24/2022 10:04:19 AM By: Worthy Keeler PA-C Entered By: Worthy Keeler on 07/24/2022 10:04:19 -------------------------------------------------------------------------------- Physical Exam Details Patient Name: Date of Service: ARDYTH, KELSO 07/24/2022 9:00 A M Medical  Record Number: [id number] Patient Account Number: [id number] Date of Birth/Sex: Treating RN: 01/24/1941 [id number] 82 y.o. [name] Primary Care Provider: [name] Other Clinician: Referring Provider: Treating Provider/Extender: Cecelia Byars in Treatment: 2 Constitutional Well-nourished and well-hydrated in no acute distress. Respiratory normal breathing without difficulty. Psychiatric this patient is able to make decisions and demonstrates good insight into disease process. Alert and Oriented x 3. pleasant and cooperative. Notes Patient's wound bed did not require any sharp debridement and again I see a lot of new growth as far as the granulation and epithelization is concerned. Overall I think that she is really doing quite well. Electronic Signature(s) Signed: 07/24/2022 10:04:43 AM By: Worthy Keeler PA-C Entered By: Worthy Keeler on 07/24/2022 10:04:43 -------------------------------------------------------------------------------- Physician Orders Details Patient Name: Date of Service: [name] 07/24/2022 9:00 A M Medical Record Number: [id number] Patient Account Number: [id number] Date of Birth/Sex: Treating RN: Sep 01, 1940 (82 y.o. [name] Primary Care Provider: [name] Other Clinician: Referring Provider: Treating Provider/Extender: Cecelia Byars in Treatment: 2 [name], [name] ([id number]) [id number] Page 3 of 6 Verbal / Phone Orders: No Diagnosis Coding Follow-up Appointments Return Appointment in 2 weeks. Bathing/ Shower/ Hygiene May shower; gently cleanse wound with antibacterial soap, rinse and pat dry prior to dressing wounds Edema Control - Lymphedema / Segmental Compressive Device / Other Elevate, Exercise Daily and A void Standing for Long Periods of Time. Elevate legs to the level of the heart and pump ankles as often as possible Elevate leg(s) parallel  to the floor when sitting. Wound Treatment Wound #2 - Ankle Wound Laterality: Left, Medial Cleanser: Byram Ancillary Kit - 15 Day Supply (Generic) 3 x Per Week/30 Days Discharge Instructions: Use supplies as instructed; Kit contains: (15) Saline Bullets; (15) 3x3 Gauze; 15 pr Gloves Prim Dressing: Xeroform-HBD 2x2 (in/in) 3 x Per Week/30 Days ary Discharge Instructions: Apply Xeroform-HBD 2x2 (in/in) as directed Secondary Dressing: (BORDER) Zetuvit Plus SILICONE BORDER Dressing 4x4 (in/in) (Generic) 3 x Per Week/30 Days Discharge Instructions: Please do not put silicone bordered dressings under wraps. Use non-bordered dressing only. Secured With: Tubigrip Size C, 2.75x10 (in/yd) (Generic) 3 x Per Week/30 Days Discharge Instructions: Apply 3 Tubigrip C 3-finger-widths below knee to base of toes to secure dressing and/or for swelling. Electronic Signature(s) Signed: 07/24/2022 12:23:06 PM By: Carlene Coria RN Signed: 07/24/2022 1:38:11 PM By: Worthy Keeler PA-C Entered By: Carlene Coria on 07/24/2022 09:59:01 -------------------------------------------------------------------------------- Problem List Details Patient Name: Date of Service: [name] 07/24/2022 9:00 A M Medical Record Number: [id number] Patient Account Number: [id number] Date of Birth/Sex: Treating RN: 29-Jul-1940 (82 y.o. 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 07/09/2022 No Yes lower extremity L97.822 Non-pressure chronic ulcer of other part of left lower leg with fat layer exposed12/21/2023 No Yes I48.0 Paroxysmal atrial fibrillation 07/09/2022 No Yes [name], [name] ([id number]) [id number]  Page 4 of 6 Z79.01 Long term (current) use of anticoagulants 07/09/2022 No Yes I10 Essential (primary) hypertension 07/09/2022 No Yes N18.30 Chronic kidney disease, stage 3 unspecified 07/09/2022 No Yes I50.42 Chronic combined systolic (congestive) and diastolic (congestive) heart failure 07/09/2022 No Yes Inactive Problems Resolved Problems Electronic Signature(s) Signed: 07/24/2022 9:40:57 AM By: Worthy Keeler PA-C Entered By: Worthy Keeler on 07/24/2022 09:40:57 -------------------------------------------------------------------------------- Progress Note Details Patient Name: Date of Service: [name]. 07/24/2022 9:00 A M  Medical Record Number: [id number] Patient Account Number: [id number] Date of Birth/Sex: Treating RN: October 18, 1940 (82 y.o. [name] Primary Care Provider: [name] Other Clinician: Referring Provider: Treating Provider/Extender: Cecelia Byars in Treatment: 2 Subjective Chief Complaint Information obtained from Patient Left medial ankle ulcer History of Present Illness (HPI) 03-31-2022 upon evaluation today patient appears to be doing decently well all things considered in regard to the wound on her left medial ankle. She tells me this has been coming and going since February. She does have some edema although her leg is not terribly large she has at least 2+ pitting edema on the left leg which is definitely worse in the right leg. She tells me that overall when she made the appointment the wound was larger I did look at notes and records in epic and I did indeed see signs that this was significantly larger than what it is currently when she was first being referred to [place]. Nonetheless at this point he does appear to be doing much better which is great news. Fortunately I do not see any evidence of active infection locally or systemically which is excellent as well. The patient does have a history of chronic venous  insufficiency, paroxysmal atrial fibrillation, long-term use of anticoagulant she is on Eliquis due to the A-fib, hypertension, chronic kidney disease stage III, and congestive heart failure. Fortunately she tells me that most of these issues are very well controlled and she is doing well currently. 04-14-22 upon evaluation today patient appears to be doing well currently in regard to her wound which in fact appears to be completely healed which is great news. Fortunately I see no evidence of active infection at this time. Readmission: 07-09-2022 upon evaluation today patient presents for initial inspection concerning a reopening of her wound on the left medial ankle region. I actually saw her back in September of this year we got her healed fairly quickly unfortunately this reopened. She did everything that I told her but I think the issue here is simply the fact that she is probably need ongoing compression we were not sure that was going to be the case initially. Patient's past medical history has not changed since September. 07-24-2022 upon evaluation today patient appears to be doing well currently in regard to her wound which is actually showing signs of excellent improvement. [name], [name] ([id number]) [id number] Page 5 of 6 am very pleased with where things stand I do believe that she is making good progress here. I do not see any signs of active infection locally nor systemically which is great news. Objective Constitutional Well-nourished and well-hydrated in no acute distress. Vitals Time Taken: 9:24 AM, Height: 67 in, Weight: 113 lbs, BMI: 17.7, Temperature: 97.3 F, Pulse: 79 bpm, Respiratory Rate: 18 breaths/min, Blood Pressure: 151/81 mmHg. Respiratory normal breathing without difficulty. Psychiatric this patient is able to make decisions and demonstrates good insight into disease process. Alert and Oriented x 3. pleasant and  cooperative. General Notes: Patient's wound bed did not require any sharp debridement and again I see a lot of new growth as far as the granulation and epithelization is concerned. Overall I think that she is really doing quite well. Integumentary (Hair, Skin) Wound #2 status is Open. Original cause of wound was Gradually Appeared. The date acquired was: 06/19/2022. The wound has been in treatment 2 weeks. The wound is located on the Left,Medial Ankle. The wound measures 0.7cm length x 0.3cm width x 0.1cm depth; 0.165cm^2  area and 0.016cm^3 volume. There is Fat Layer (Subcutaneous Tissue) exposed. There is no tunneling or undermining noted. There is a medium amount of serosanguineous drainage noted. There is medium (34-66%) red granulation within the wound bed. There is a medium (34-66%) amount of necrotic tissue within the wound bed including Adherent Slough. 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 Bathing/ Shower/ Hygiene: May shower; gently cleanse wound with antibacterial soap, rinse and pat dry prior to dressing wounds Edema Control - Lymphedema / Segmental Compressive Device / Other: Elevate, Exercise Daily and Avoid Standing for Long Periods of Time. Elevate legs to the level of the heart and pump ankles as often as possible Elevate leg(s) parallel to the floor when sitting. WOUND #2: - Ankle Wound Laterality: Left, Medial Cleanser: Byram Ancillary Kit - 15 Day Supply (Generic) 3 x Per Week/30 Days Discharge Instructions: Use  supplies as instructed; Kit contains: (15) Saline Bullets; (15) 3x3 Gauze; 15 pr Gloves Prim Dressing: Xeroform-HBD 2x2 (in/in) 3 x Per Week/30 Days ary Discharge Instructions: Apply Xeroform-HBD 2x2 (in/in) as directed Secondary Dressing: (BORDER) Zetuvit Plus SILICONE BORDER Dressing 4x4 (in/in) (Generic) 3 x Per Week/30 Days Discharge Instructions: Please do not put silicone bordered dressings under wraps. Use non-bordered dressing only. Secured With: Tubigrip Size C, 2.75x10 (in/yd) (Generic) 3 x Per Week/30 Days Discharge Instructions: Apply 3 Tubigrip C 3-finger-widths below knee to base of toes to secure dressing and/or for swelling. 1. I am going to recommend currently that we have the patient continue to utilize the Tubigrip size C she is also using Xeroform and a border foam dressing to cover. 2. I am also can recommend that we have the patient continue to change this 3 times per week which I think should be just fine. 3. I am also going to suggest that she continue to elevate her legs much as possible to help with edema control. We will see patient back for reevaluation in 1 week here in the clinic. If anything worsens or changes patient will contact our office for additional recommendations. [name], [name] ([id number]) [id number] Page 6 of 6 Electronic Signature(s) Signed: 07/24/2022 10:06:44 AM By: Worthy Keeler PA-C Entered By: Worthy Keeler on 07/24/2022 10:06:44 -------------------------------------------------------------------------------- SuperBill Details Patient Name: Date of Service: [name], [name] 07/24/2022 Medical Record Number: [id number] Patient Account Number: [id number] Date of Birth/Sex: Treating RN: [birth year] (82 y.o. [name] Primary Care Provider: [name] Other Clinician: Referring Provider: Treating Provider/Extender: Cecelia Byars in Treatment: 2 Diagnosis Coding ICD-10  Codes Code Description [phone] Chronic venous hypertension (idiopathic) with ulcer and inflammation of left lower extremity L97.822 Non-pressure chronic ulcer of other part of left lower leg with fat layer exposed I48.0 Paroxysmal atrial fibrillation Z79.01 Long term (current) use of anticoagulants I10 Essential (primary) hypertension N18.30 Chronic kidney disease, stage 3 unspecified I50.42 Chronic combined systolic (congestive) and diastolic (congestive) heart failure Physician Procedures : CPT4 Code Description Modifier [id number] - WC PHYS LEVEL 3 - EST PT ICD-10 Diagnosis Description I87.332 Chronic venous hypertension (idiopathic) with ulcer and inflammation of left lower extremity L97.822 Non-pressure chronic ulcer of other part  of left lower leg with  fat layer exposed I48.0 Paroxysmal atrial fibrillation Z79.01 Long term (current) use of anticoagulants Quantity: 1 Electronic Signature(s) Signed: 07/24/2022 10:07:23 AM By: Worthy Keeler PA-C Entered By: Worthy Keeler on 07/24/2022 10:07:23

## 2022-07-25 ENCOUNTER — Telehealth: Payer: Self-pay | Admitting: Physician Assistant

## 2022-07-25 NOTE — Telephone Encounter (Addendum)
Pt called stating she received an afib alert this morning at 3AM on her Apple watch. She received 2 additional alerts and got up at [facility] and took all AM medications including amiodarone, amlodipine, and eliquis. HR has ranged from 68 to 120, she does not know what her BP is. She denies dizziness, SOB, and CP. Her only complaint is she feels weak. She mentioned before that Dr. Caryl Comes advised waiting a bit to come to the ER to see if she self-converts at home.  She also questions taking extra medications. I advised she could try to take an additional amiodarone and wait 2 hours. I advised if she had rates persistently greater that 100 or if her BP was low, then she would need to be seen in the ER and possibly cardioverted. I advised to not shower until someone was at the house with her. I advised she not drive. She will call back in 2 hrs and let me know how she is feeling. She sounds stable on the phone at this time.  She returned call at 11:00 AM. She is feeling much better and BP has come down to 116/84. HR now 76. She confirmed she has an appt with Dr. Quentin Ore next week. All very good news.

## 2022-07-27 ENCOUNTER — Telehealth: Payer: Self-pay | Admitting: Cardiology

## 2022-07-27 MED ORDER — AMIODARONE HCL 200 MG PO TABS: 200.0000 mg | ORAL_TABLET | Freq: Two times a day (BID) | ORAL | 1 refills | Status: DC

## 2022-07-27 NOTE — Telephone Encounter (Signed)
Patient c/o Palpitations:  High priority if patient c/o lightheadedness, shortness of breath, or chest pain  How long have you had palpitations/irregular HR/ Afib? Are you having the symptoms now?  Patient states she initially went into afib on Friday evening. She assumes she is still in afib  Are you currently experiencing lightheadedness, SOB or CP?  No   Do you have a history of afib (atrial fibrillation) or irregular heart rhythm?  Yes   Have you checked your BP or HR? (document readings if available):  BP is fine  HR: 40-128 range 72 currently  Are you experiencing any other symptoms?  No--patient mentions that she can feel her heart beating, but it is not racing

## 2022-07-27 NOTE — Telephone Encounter (Signed)
Spoke with pt who reports Afib since Friday.  Complains of SOB on exertion.  Denies current CP or dizziness.  Pt is scheduled to see Dr Quentin Ore on 07/29/2021 at the [facility] office.  Secure chat sent to Vela Prose with Afib clinic re: appointment Per Adline Peals, PA-C pt should increase Amiodarone '200mg'$  - 1 tablet by mouth twice daily until she sees Dr Quentin Ore.  Reviewed ED precautions.  Pt verbalizes understanding and agrees with current plan.

## 2022-07-28 NOTE — Progress Notes (Unsigned)
Electrophysiology Office Follow up Visit Note:    Date:  07/28/2022   ID:  [name], DOB 1941/02/02, MRN [id number]  PCP:  [name], MD  [facility] Cardiologist:  Virl Axe, MD  [facility] Electrophysiologist:  Vickie Epley, MD    Interval History:    [name] is a 82 y.o. female who presents for a follow up visit. She had an AF ablation 05/01/2022 during which the veins and posterior wall were ablated. She called in earlier this month with an episode of breakthrough AF.  Since the ablation, ***       Past Medical History:  Diagnosis Date   (HFpEF) heart failure with preserved ejection fraction ([place])    a. 08/2017 Echo: EF 55-60%, no rwma, mild MR, mildly dil LA, nl RV fxn; b. 03/2020 Echo: EF 60-65%, no rwma, Gr2 DD. RVSP 44.98mHg. Mod dil LA. Mild MR; c. 09/2020 Echo: EF 50-55%, no rwma, mild LVH, mod red RV fxn, mildly dily RA.   Acute CHF (congestive heart failure) ([place] 09/18/2020   Alcohol abuse 04/05/2020   Breast cancer ([facility] 2001   left breast   Cancer ([facility] 2001   left breast ca   Carotid arterial disease ([facility]    a. 10/2004 s/p L CEA; b. 12/2015 Carotid U/S: RICA 1-39%; b. LICA patent CEA site.   Closed right hip fracture ([place] 09/20/2019   GERD (gastroesophageal reflux disease)    Hyperlipidemia    Hypertension    Osteoarthritis, multiple sites    Osteopenia    Persistent atrial fibrillation ([place]    a. Dx 08/2017; b. CHA2DS2VASc = 6-->Pradaxa; c. 09/2017 Successful DCCV (second shock - 200J); d. 10/2017 Recurrent Afib-->flecainide started 11/2017; e. 06/2020 s/p DCCV (200J x 1); f. 06/2020 Recurrent AF->Flec inc 75bid; g. 09/2020 WCT->flec d/c'd->amio started.   Personal history of radiation therapy 2001   left breast ca   Psoriasis    Wide-complex tachycardia    a. 09/2020 in setting of presumed Flecainide toxicity.  Flecainide d/c'd.    Past Surgical History:  Procedure Laterality Date   ABDOMINAL HYSTERECTOMY     ANKLE  FRACTURE SURGERY  4/08   left---hardware still in place   ATRIAL FIBRILLATION ABLATION N/A 05/01/2022   Procedure: ATRIAL FIBRILLATION ABLATION;  Surgeon: LVickie Epley MD;  [facility];  Service: Cardiovascular;  Laterality: N/A;   BREAST BIOPSY Left 2001   breast ca   BREAST EXCISIONAL BIOPSY Left yrs ago   benign   BREAST LUMPECTOMY Left 2001   f/u radiation   CARDIOVERSION N/A 10/04/2017   Procedure: CARDIOVERSION;  Surgeon: [name] MD;  [facility];  Service: Cardiovascular;  Laterality: N/A;   CARDIOVERSION N/A 12/16/2017   Procedure: CARDIOVERSION;  Surgeon: [name] MD;  [facility];  Service: Cardiovascular;  Laterality: N/A;   CARDIOVERSION N/A 06/24/2020   Procedure: CARDIOVERSION;  Surgeon: [name] MD;  [facility];  Service: Cardiovascular;  Laterality: N/A;   CAROTID ENDARTERECTOMY Left 10/23/2004   FRACTURE SURGERY     OOPHORECTOMY     SHOULDER SURGERY  6/07   left   TONSILLECTOMY AND ADENOIDECTOMY     TOTAL HIP ARTHROPLASTY  2004   right    Current Medications: No outpatient medications have been marked as taking for the 07/29/22 encounter (Appointment) with LVickie Epley MD.     Allergies:   Patient has no known allergies.   Social History  Socioeconomic History   Marital status: Widowed    Spouse name: Not on file   Number of children: Not on file   Years of education: Not on file   Highest education level: Not on file  Occupational History   Occupation: Marketing    Comment: Retired  Tobacco Use   Smoking status: Former    Packs/day: 1.00    Years: 40.00    Total pack years: 40.00    Types: Cigarettes    Quit date: 07/21/1995    Years since quitting: 27.0   Smokeless tobacco: Never   Tobacco comments:    Former smoker 05/15/22  Vaping Use   Vaping Use: Never used  Substance and Sexual Activity   Alcohol use: Yes    Alcohol/week: 14.0 standard drinks of alcohol     Types: 7 Glasses of wine, 7 Standard drinks or equivalent per week    Comment: 1 glass of wine and 1 drink of liquor daily 05/15/22   Drug use: No   Sexual activity: Never  Other Topics Concern   Not on file  Social History Narrative   1 natural child   3 adopted children   Artist---still teaches water colors   Husband has Alzheimers      Has living will   Daughter Amy is health care POA   DNR    No tube feeds if cognitively unaware   Social Determinants of Health   Financial Resource Strain: Low Risk  (02/13/2022)   Overall Financial Resource Strain (CARDIA)    Difficulty of Paying Living Expenses: Not hard at all  Food Insecurity: No Food Insecurity (02/13/2022)   Hunger Vital Sign    Worried About Running Out of Food in the Last Year: Never true    Ran Out of Food in the Last Year: Never true  Transportation Needs: No Transportation Needs (02/13/2022)   PRAPARE - Hydrologist (Medical): No    Lack of Transportation (Non-Medical): No  Physical Activity: Unknown (02/10/2021)   Exercise Vital Sign    Days of Exercise per Week: 0 days    Minutes of Exercise per Session: Not on file  Stress: No Stress Concern Present (02/13/2022)   [place]    Feeling of Stress : Not at all  Social Connections: Unknown (02/13/2022)   Social Connection and Isolation Panel [NHANES]    Frequency of Communication with Friends and Family: More than three times a week    Frequency of Social Gatherings with Friends and Family: More than three times a week    Attends Religious Services: Not on Advertising copywriter or Organizations: Not on file    Attends Archivist Meetings: Not on file    Marital Status: Not on file     Family History: The patient's family history includes Hypertension in her brother; Leukemia in her father; Pneumonia in her mother. There is no history of Heart disease,  Diabetes, or Breast cancer.  ROS:   Please see the history of present illness.    All other systems reviewed and are negative.  EKGs/Labs/Other Studies Reviewed:    The following studies were reviewed today:   EKG:  The ekg ordered today demonstrates ***  Recent Labs: 11/19/2021: Magnesium 1.6 02/27/2022: Pro B Natriuretic peptide (BNP) 420.0; TSH 2.58 03/02/2022: ALT 59 05/08/2022: B Natriuretic Peptide 343.0; BUN 24; Creatinine, Ser 1.18; Hemoglobin 12.7; Platelets 279; Potassium 3.7;  Sodium 142  Recent Lipid Panel    Component Value Date/Time   CHOL 216 (H) 01/11/2019 0952   TRIG 116.0 01/11/2019 0952   HDL 88.90 01/11/2019 0952   CHOLHDL 2 01/11/2019 0952   VLDL 23.2 01/11/2019 0952   LDLCALC 104 (H) 01/11/2019 0952   LDLDIRECT 151.4 02/01/2012 1113    Physical Exam:    VS:  There were no vitals taken for this visit.    Wt Readings from Last 3 Encounters:  06/18/22 113 lb 4 oz (51.4 kg)  06/09/22 116 lb 12.8 oz (53 kg)  05/15/22 118 lb 12.8 oz (53.9 kg)     GEN: *** Well nourished, well developed in no acute distress CARDIAC: ***RRR, no murmurs, rubs, gallops RESPIRATORY:  Clear to auscultation without rales, wheezing or rhonchi  PSYCHIATRIC:  Normal affect        ASSESSMENT:    1. Persistent atrial fibrillation ([place])   2. Chronic heart failure with preserved ejection fraction (HCC)    PLAN:    In order of problems listed above:   #Persistent AF   #HFpEF NYHA II. Warm and dry.  #Amiodarone monitoring Update CMP, TSH and FT4 today.        Total time spent with patient today *** minutes. This includes reviewing records, evaluating the patient and coordinating care.   Medication Adjustments/Labs and Tests Ordered: Current medicines are reviewed at length with the patient today.  Concerns regarding medicines are outlined above.  No orders of the defined types were placed in this encounter.  No orders of the defined types were placed in this  encounter.    Signed, Lars Mage, MD, [facility], [facility] 07/28/2022 10:15 AM    Electrophysiology [facility]

## 2022-07-28 NOTE — H&P (View-Only) (Signed)
Electrophysiology Office Follow up Visit Note:    Date:  07/28/2022   ID:  [name], DOB 1940-12-12, MRN [id number]  PCP:  Dana Haven, MD  [facility] Cardiologist:  Dana Axe, MD  [facility] Electrophysiologist:  Dana Epley, MD    Interval History:    [name] is a 82 y.o. female who presents for a follow up visit. She had an AF ablation 05/01/2022 during which the veins and posterior wall were ablated. She called in earlier this month with an episode of breakthrough AF.  She did well initially after the ablation but then has developed recurrence of her A-fib that is symptomatic.  She thinks she went back into A-fib on Friday and has been highly symptomatic while out of rhythm.  Her heart rates are in the 130s to 150s based on her Apple Watch recordings.  She is fatigued while out of rhythm.  She is currently taking amiodarone 200 mg by mouth twice daily.  She is taking Eliquis 2.5 mg by mouth twice daily for stroke prophylaxis.       Past Medical History:  Diagnosis Date   (HFpEF) heart failure with preserved ejection fraction ([place])    a. 08/2017 Echo: EF 55-60%, no rwma, mild MR, mildly dil LA, nl RV fxn; b. 03/2020 Echo: EF 60-65%, no rwma, Gr2 DD. RVSP 44.76mHg. Mod dil LA. Mild MR; c. 09/2020 Echo: EF 50-55%, no rwma, mild LVH, mod red RV fxn, mildly dily RA.   Acute CHF (congestive heart failure) ([facility] 09/18/2020   Alcohol abuse 04/05/2020   Breast cancer ([facility] 2001   left breast   Cancer ([place] 2001   left breast ca   Carotid arterial disease ([facility]    a. 10/2004 s/p L CEA; b. 12/2015 Carotid U/S: RICA 1-39%; b. LICA patent CEA site.   Closed right hip fracture ([facility] 09/20/2019   GERD (gastroesophageal reflux disease)    Hyperlipidemia    Hypertension    Osteoarthritis, multiple sites    Osteopenia    Persistent atrial fibrillation ([place]    a. Dx 08/2017; b. CHA2DS2VASc = 6-->Pradaxa; c. 09/2017 Successful DCCV (second shock - 200J); d.  10/2017 Recurrent Afib-->flecainide started 11/2017; e. 06/2020 s/p DCCV (200J x 1); f. 06/2020 Recurrent AF->Flec inc 75bid; g. 09/2020 WCT->flec d/c'd->amio started.   Personal history of radiation therapy 2001   left breast ca   Psoriasis    Wide-complex tachycardia    a. 09/2020 in setting of presumed Flecainide toxicity.  Flecainide d/c'd.    Past Surgical History:  Procedure Laterality Date   ABDOMINAL HYSTERECTOMY     ANKLE FRACTURE SURGERY  4/08   left---hardware still in place   ATRIAL FIBRILLATION ABLATION N/A 05/01/2022   Procedure: ATRIAL FIBRILLATION ABLATION;  Surgeon: LVickie Epley MD;  [facility];  Service: Cardiovascular;  Laterality: N/A;   BREAST BIOPSY Left 2001   breast ca   BREAST EXCISIONAL BIOPSY Left yrs ago   benign   BREAST LUMPECTOMY Left 2001   f/u radiation   CARDIOVERSION N/A 10/04/2017   Procedure: CARDIOVERSION;  Surgeon: [name] MD;  [facility];  Service: Cardiovascular;  Laterality: N/A;   CARDIOVERSION N/A 12/16/2017   Procedure: CARDIOVERSION;  Surgeon: [name] MD;  [facility];  Service: Cardiovascular;  Laterality: N/A;   CARDIOVERSION N/A 06/24/2020   Procedure: CARDIOVERSION;  Surgeon: [name] MD;  Location: [facility] ORS;  Service: Cardiovascular;  Laterality: N/A;  CAROTID ENDARTERECTOMY Left 10/23/2004   FRACTURE SURGERY     OOPHORECTOMY     SHOULDER SURGERY  6/07   left   TONSILLECTOMY AND ADENOIDECTOMY     TOTAL HIP ARTHROPLASTY  2004   right    Current Medications: No outpatient medications have been marked as taking for the 07/29/22 encounter (Appointment) with Dana Epley, MD.     Allergies:   Patient has no known allergies.   Social History   Socioeconomic History   Marital status: Widowed    Spouse name: Not on file   Number of children: Not on file   Years of education: Not on file   Highest education level: Not on file  Occupational History    Occupation: Marketing    Comment: Retired  Tobacco Use   Smoking status: Former    Packs/day: 1.00    Years: 40.00    Total pack years: 40.00    Types: Cigarettes    Quit date: 07/21/1995    Years since quitting: 27.0   Smokeless tobacco: Never   Tobacco comments:    Former smoker 05/15/22  Vaping Use   Vaping Use: Never used  Substance and Sexual Activity   Alcohol use: Yes    Alcohol/week: 14.0 standard drinks of alcohol    Types: 7 Glasses of wine, 7 Standard drinks or equivalent per week    Comment: 1 glass of wine and 1 drink of liquor daily 05/15/22   Drug use: No   Sexual activity: Never  Other Topics Concern   Not on file  Social History Narrative   1 natural child   3 adopted children   Artist---still teaches water colors   Husband has Alzheimers      Has living will   Daughter [name] is health care POA   DNR    No tube feeds if cognitively unaware   Social Determinants of Health   Financial Resource Strain: Low Risk  (02/13/2022)   Overall Financial Resource Strain (CARDIA)    Difficulty of Paying Living Expenses: Not hard at all  Food Insecurity: No Food Insecurity (02/13/2022)   Hunger Vital Sign    Worried About Running Out of Food in the Last Year: Never true    Ran Out of Food in the Last Year: Never true  Transportation Needs: No Transportation Needs (02/13/2022)   PRAPARE - Hydrologist (Medical): No    Lack of Transportation (Non-Medical): No  Physical Activity: Unknown (02/10/2021)   Exercise Vital Sign    Days of Exercise per Week: 0 days    Minutes of Exercise per Session: Not on file  Stress: No Stress Concern Present (02/13/2022)   [place]    Feeling of Stress : Not at all  Social Connections: Unknown (02/13/2022)   Social Connection and Isolation Panel [NHANES]    Frequency of Communication with Friends and Family: More than three times a week     Frequency of Social Gatherings with Friends and Family: More than three times a week    Attends Religious Services: Not on Advertising copywriter or Organizations: Not on file    Attends Archivist Meetings: Not on file    Marital Status: Not on file     Family History: The patient's family history includes Hypertension in her brother; Leukemia in her father; Pneumonia in her mother. There is no history of Heart disease,  Diabetes, or Breast cancer.  ROS:   Please see the history of present illness.    All other systems reviewed and are negative.  EKGs/Labs/Other Studies Reviewed:    The following studies were reviewed today:   EKG:  The ekg ordered today demonstrates A-fib with RVR.  Ventricular rates 132 bpm.  Recent Labs: 11/19/2021: Magnesium 1.6 02/27/2022: Pro B Natriuretic peptide (BNP) 420.0; TSH 2.58 03/02/2022: ALT 59 05/08/2022: B Natriuretic Peptide 343.0; BUN 24; Creatinine, Ser 1.18; Hemoglobin 12.7; Platelets 279; Potassium 3.7; Sodium 142  Recent Lipid Panel    Component Value Date/Time   CHOL 216 (H) 01/11/2019 0952   TRIG 116.0 01/11/2019 0952   HDL 88.90 01/11/2019 0952   CHOLHDL 2 01/11/2019 0952   VLDL 23.2 01/11/2019 0952   LDLCALC 104 (H) 01/11/2019 0952   LDLDIRECT 151.4 02/01/2012 1113    Physical Exam:    VS:  There were no vitals taken for this visit.    Wt Readings from Last 3 Encounters:  06/18/22 113 lb 4 oz (51.4 kg)  06/09/22 116 lb 12.8 oz (53 kg)  05/15/22 118 lb 12.8 oz (53.9 kg)     GEN: Elderly, no distress CARDIAC: Irregularly irregular, tachycardic, no murmurs, rubs, gallops RESPIRATORY:  Clear to auscultation without rales, wheezing or rhonchi  PSYCHIATRIC:  Normal affect        ASSESSMENT:    1. Persistent atrial fibrillation ([place])   2. Chronic heart failure with preserved ejection fraction (HCC)    PLAN:    In order of problems listed above:   #Persistent AF Symptomatic.  Unfortunately has  developed a recurrence after her October ablation.  Given the highly symptomatic nature of her arrhythmia and her initial good response to the ablation, I would favor a repeat catheter ablation to check to see if there are any gaps in the lesion sets and to identify any other triggers.  She understands that this is not a perfect solution to her atrial fibrillation and she may continue to have atrial fibrillation despite a repeat catheter ablation attempt.  To get her some immediate relief, I will schedule her cardioversion.  I discussed the cardioversion procedure in detail including the risks and she wishes to proceed.  Discussed treatment options today for their AF including antiarrhythmic drug therapy and ablation. Discussed risks, recovery and likelihood of success. Discussed potential need for repeat ablation procedures and antiarrhythmic drugs after an initial ablation. They wish to proceed with scheduling.  Risk, benefits, and alternatives to EP study and radiofrequency ablation for afib were also discussed in detail today. These risks include but are not limited to stroke, bleeding, vascular damage, tamponade, perforation, damage to the esophagus, lungs, and other structures, pulmonary vein stenosis, worsening renal function, and death. The patient understands these risk and wishes to proceed.  We will therefore proceed with catheter ablation at the next available time.  Carto, ICE, anesthesia are requested for the procedure.  Will also obtain CT PV protocol prior to the procedure to exclude LAA thrombus and further evaluate atrial anatomy.   #HFpEF NYHA II. Warm and dry. Rhythm control important for her.  #Amiodarone monitoring Update CMP, TSH and FT4 today.     Medication Adjustments/Labs and Tests Ordered: Current medicines are reviewed at length with the patient today.  Concerns regarding medicines are outlined above.  No orders of the defined types were placed in this  encounter.  No orders of the defined types were placed in this encounter.    Signed,  Lars Mage, MD, [facility], [facility] 07/28/2022 10:15 AM    Electrophysiology [facility]

## 2022-07-29 ENCOUNTER — Other Ambulatory Visit: Payer: Self-pay | Admitting: Internal Medicine

## 2022-07-29 ENCOUNTER — Ambulatory Visit: Payer: Medicare Other | Admitting: Cardiology

## 2022-07-29 ENCOUNTER — Other Ambulatory Visit
Admission: RE | Admit: 2022-07-29 | Discharge: 2022-07-29 | Disposition: A | Discharge status: Home / Self Care | Payer: Medicare Other | Attending: Cardiology | Admitting: Cardiology

## 2022-07-29 ENCOUNTER — Ambulatory Visit: Payer: Medicare Other | Attending: Cardiology | Admitting: Cardiology

## 2022-07-29 ENCOUNTER — Encounter: Payer: Self-pay | Admitting: Cardiology

## 2022-07-29 VITALS — BP 126/74 | HR 132 | Ht 67.0 in | Wt 115.0 lb

## 2022-07-29 DIAGNOSIS — I5032 Chronic diastolic (congestive) heart failure: Secondary | ICD-10-CM | POA: Insufficient documentation

## 2022-07-29 DIAGNOSIS — Z79899 Other long term (current) drug therapy: Secondary | ICD-10-CM

## 2022-07-29 DIAGNOSIS — Z01812 Encounter for preprocedural laboratory examination: Secondary | ICD-10-CM | POA: Diagnosis not present

## 2022-07-29 DIAGNOSIS — I4819 Other persistent atrial fibrillation: Secondary | ICD-10-CM | POA: Insufficient documentation

## 2022-07-29 LAB — COMPREHENSIVE METABOLIC PANEL
ALT: 17 U/L (ref 0–44)
AST: 25 U/L (ref 15–41)
Albumin: 4.1 g/dL (ref 3.5–5.0)
Alkaline Phosphatase: 77 U/L (ref 38–126)
Anion gap: 13 (ref 5–15)
BUN: 33 mg/dL — ABNORMAL HIGH (ref 8–23)
CO2: 27 mmol/L (ref 22–32)
Calcium: 9.4 mg/dL (ref 8.9–10.3)
Chloride: 100 mmol/L (ref 98–111)
Creatinine, Ser: 1.1 mg/dL — ABNORMAL HIGH (ref 0.44–1.00)
GFR, Estimated: 50 mL/min — ABNORMAL LOW (ref >60)
Glucose, Bld: 120 mg/dL — ABNORMAL HIGH (ref 70–99)
Potassium: 3.7 mmol/L (ref 3.5–5.1)
Sodium: 140 mmol/L (ref 135–145)
Total Bilirubin: 0.8 mg/dL (ref 0.3–1.2)
Total Protein: 7.6 g/dL (ref 6.5–8.1)

## 2022-07-29 LAB — CBC
HCT: 37.1 % (ref 36.0–46.0)
Hemoglobin: 12.4 g/dL (ref 12.0–15.0)
MCH: 34.6 pg — ABNORMAL HIGH (ref 26.0–34.0)
MCHC: 33.4 g/dL (ref 30.0–36.0)
MCV: 103.6 fL — ABNORMAL HIGH (ref 80.0–100.0)
Platelets: 300 10*3/uL (ref 150–400)
RBC: 3.58 MIL/uL — ABNORMAL LOW (ref 3.87–5.11)
RDW: 14.7 % (ref 11.5–15.5)
WBC: 5.9 10*3/uL (ref 4.0–10.5)
nRBC: 0 % (ref 0.0–0.2)

## 2022-07-29 LAB — TSH: TSH: 2.459 u[IU]/mL (ref 0.350–4.500)

## 2022-07-29 LAB — T4, FREE: Free T4: 1.21 ng/dL — ABNORMAL HIGH (ref 0.61–1.12)

## 2022-07-29 NOTE — Patient Instructions (Addendum)
Medication Instructions:  Your physician recommends that you continue on your current medications as directed. Please refer to the Current Medication list given to you today.  *If you need a refill on your cardiac medications before your next appointment, please call your pharmacy*   Lab Work: CBC, CMET, TSH and Free T4 If you have labs (blood work) drawn today and your tests are completely normal, you will receive your results only by: [place] (if you have MyChart) OR A paper copy in the mail If you have any lab test that is abnormal or we need to change your treatment, we will call you to review the results.   Testing/Procedures: Your physician has recommended that you have an ablation. Catheter ablation is a medical procedure used to treat some cardiac arrhythmias (irregular heartbeats). During catheter ablation, a long, thin, flexible tube is put into a blood vessel in your groin (upper thigh), or neck. This tube is called an ablation catheter. It is then guided to your heart through the blood vessel. Radio frequency waves destroy small areas of heart tissue where abnormal heartbeats may cause an arrhythmia to start. Please see the instruction sheet given to you today.   Your physician has recommended that you have a Cardioversion (DCCV). Electrical Cardioversion uses a jolt of electricity to your heart either through paddles or wired patches attached to your chest. This is a controlled, usually prescheduled, procedure. Defibrillation is done under light anesthesia in the hospital, and you usually go home the day of the procedure. This is done to get your heart back into a normal rhythm. You are not awake for the procedure. Please see the instruction sheet given to you today.    Follow-Up: At [facility], you and your health needs are our priority.  As part of our continuing mission to provide you with exceptional heart care, we have created designated Provider Care Teams.   These Care Teams include your primary Cardiologist (physician) and Advanced Practice Providers (APPs -  Physician Assistants and Nurse Practitioners) who all work together to provide you with the care you need, when you need it.  We recommend signing up for the patient portal called "MyChart".  Sign up information is provided on this After Visit Summary.  MyChart is used to connect with patients for Virtual Visits (Telemedicine).  Patients are able to view lab/test results, encounter notes, upcoming appointments, etc.  Non-urgent messages can be sent to your provider as well.   To learn more about what you can do with MyChart, go to [email or web address].    Your next appointment:   To be scheduled  Other Instructions You are scheduled for a Cardioversion on ___01/22/2024_____________ with Dr.Arida. Please arrive at the [place] [facility] at __630am_______ a.m. on the day of your procedure.  DIET INSTRUCTIONS:  Nothing to eat or drink after midnight except your medications with a sip of water.  Do not take your Furosemide the morning of your cardioversion         Labs: Aletha Halim be completed today upstairs at the [facility].  Medications:  YOU MAY TAKE ALL of your remaining medications with a small amount of water.  Must have a responsible person to drive you home.  Bring a current list of your medications and current insurance cards.    If you have any questions after you get home, please call the office at [place]

## 2022-08-04 ENCOUNTER — Ambulatory Visit (INDEPENDENT_AMBULATORY_CARE_PROVIDER_SITE_OTHER): Payer: Medicare Other | Admitting: Family Medicine

## 2022-08-04 ENCOUNTER — Other Ambulatory Visit: Payer: Self-pay

## 2022-08-04 ENCOUNTER — Encounter: Payer: Self-pay | Admitting: Family Medicine

## 2022-08-04 VITALS — BP 140/60 | HR 51 | Temp 97.5°F | Ht 67.0 in | Wt 117.6 lb

## 2022-08-04 DIAGNOSIS — I480 Paroxysmal atrial fibrillation: Secondary | ICD-10-CM | POA: Diagnosis not present

## 2022-08-04 DIAGNOSIS — D472 Monoclonal gammopathy: Secondary | ICD-10-CM

## 2022-08-04 DIAGNOSIS — M8000XD Age-related osteoporosis with current pathological fracture, unspecified site, subsequent encounter for fracture with routine healing: Secondary | ICD-10-CM | POA: Diagnosis not present

## 2022-08-04 DIAGNOSIS — F419 Anxiety disorder, unspecified: Secondary | ICD-10-CM

## 2022-08-04 DIAGNOSIS — I4819 Other persistent atrial fibrillation: Secondary | ICD-10-CM

## 2022-08-04 NOTE — Assessment & Plan Note (Signed)
Chronic issue.  Sinus rhythm today.  Amiodarone is managed by cardiology.  She will continue Eliquis 2.5 mg twice daily.

## 2022-08-04 NOTE — Progress Notes (Signed)
[name], MD Phone: [phone]  [name] is a 82 y.o. female who presents today for follow-up.  Anxiety: Patient notes this is normal.  She notes nothing excessive.  No depression.  She takes the Ativan a couple of times a week to help calm her mind down at night.  No drowsiness with this.  She is on Remeron.  No SI.  Notes she generally does sleep well.  Atrial fibrillation: She is on amiodarone and Eliquis.  No palpitations.  She has a cardioversion and ablation planned for the next several months.  She feels a little tired and thinks it may be related to the A-fib.  Osteoporosis: She does report history of possible Barrett's esophagus and prior ulcers.  Social History   Tobacco Use  Smoking Status Former   Packs/day: 1.00   Years: 40.00   Total pack years: 40.00   Types: Cigarettes   Quit date: 07/21/1995   Years since quitting: 27.0  Smokeless Tobacco Never  Tobacco Comments   Former smoker 05/15/22    Current Outpatient Medications on File Prior to Visit  Medication Sig Dispense Refill   acetaminophen (TYLENOL) 325 MG tablet Take 2 tablets (650 mg total) by mouth every 4 (four) hours as needed for headache or mild pain.     allopurinol (ZYLOPRIM) 100 MG tablet TAKE 1.5 TABLETS ('150MG'$  TOTAL) BY MOUTH DAILY 135 tablet 1   amiodarone (PACERONE) 200 MG tablet Take 1 tablet (200 mg total) by mouth 2 (two) times daily. 90 tablet 1   amLODipine (NORVASC) 5 MG tablet Take 5 mg by mouth daily.     apixaban (ELIQUIS) 2.5 MG TABS tablet TAKE 1 TABLET BY MOUTH TWICE A DAY 60 tablet 5   cholecalciferol (VITAMIN D) 25 MCG (1000 UNIT) tablet Take 2 tablets (2,000 Units total) by mouth daily.     furosemide (LASIX) 20 MG tablet TAKE 1 TABLET BY MOUTH EVERY DAY 90 tablet 2   furosemide (LASIX) 40 MG tablet TAKE 0.5 TABLETS (20 MG TOTAL) BY MOUTH EVERY OTHER DAY. 45 tablet 0   LORazepam (ATIVAN) 0.5 MG tablet TAKE 1 TABLET BY MOUTH TWICE A DAY AS NEEDED FOR ANXIETY 60 tablet  0   mirtazapine (REMERON) 30 MG tablet Take 1 tablet (30 mg total) by mouth at bedtime. 90 tablet 3   Multiple Vitamin (MULTIVITAMIN) capsule Take 1 capsule by mouth daily.     Multiple Vitamins-Minerals (PRESERVISION AREDS 2+MULTI VIT PO) Take 1 capsule by mouth in the morning and at bedtime.     No current facility-administered medications on file prior to visit.     ROS see history of present illness  Objective  Physical Exam Vitals:   08/04/22 0957 08/04/22 1016  BP: (!) 140/60 (!) 140/60  Pulse: (!) 51   Temp: (!) 97.5 F (36.4 C)   SpO2: 95%     BP Readings from Last 3 Encounters:  08/04/22 (!) 140/60  07/29/22 126/74  06/18/22 (!) 146/92   Wt Readings from Last 3 Encounters:  08/04/22 117 lb 9.6 oz (53.3 kg)  07/29/22 115 lb (52.2 kg)  06/18/22 113 lb 4 oz (51.4 kg)    Physical Exam Constitutional:      General: She is not in acute distress.    Appearance: She is not diaphoretic.  Cardiovascular:     Rate and Rhythm: Normal rate and regular rhythm.     Heart sounds: Normal heart sounds.  Pulmonary:     Effort: Pulmonary effort is normal.  Breath sounds: Normal breath sounds.  Skin:    General: Skin is warm and dry.  Neurological:     Mental Status: She is alert.      Assessment/Plan: Please see individual problem list.  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  Discussed rechecking an SPEP today per prior recommendations.  She declines this.  She understands the risk of missing progression of this issue.   Anxiety Assessment & Plan: Chronic issue.  Adequately controlled.  Continue lorazepam 0.5 mg twice daily as needed for anxiety and Remeron 30 mg daily.   Paroxysmal atrial fibrillation (HCC) Assessment & Plan: Chronic issue.  Sinus rhythm today.  Amiodarone is managed by cardiology.  She will continue Eliquis 2.5 mg twice daily.   Age-related osteoporosis with current pathological fracture  with routine healing, subsequent encounter Assessment & Plan: Discussed treatment options.  Oral bisphosphonates are not an option given her possible Barrett's and ulcer history.  Discussed Prolia and Reclast.  Discussed that if patient started on Prolia she would need to remain on Prolia long-term given risk of rebound fractures at about the 39-monthmark after discontinuing Prolia.  Discussed the risk of osteonecrosis of the jaw.  She notes no upcoming dental work.  Discussed the need to check lab work prior to starting Prolia.  She wants to defer treatment at this time until after she has completed treatment for her A-fib.     Return in about 3 months (around 11/03/2022) for osteoporosis, afib.   [place]ommi Rumps MD [place]

## 2022-08-04 NOTE — Assessment & Plan Note (Addendum)
Chronic issue.  Discussed rechecking an SPEP today per prior recommendations.  She declines this.  She understands the risk of missing progression of this issue.

## 2022-08-04 NOTE — Assessment & Plan Note (Signed)
Discussed treatment options.  Oral bisphosphonates are not an option given her possible Barrett's and ulcer history.  Discussed Prolia and Reclast.  Discussed that if patient started on Prolia she would need to remain on Prolia long-term given risk of rebound fractures at about the 54-monthmark after discontinuing Prolia.  Discussed the risk of osteonecrosis of the jaw.  She notes no upcoming dental work.  Discussed the need to check lab work prior to starting Prolia.  She wants to defer treatment at this time until after she has completed treatment for her A-fib.

## 2022-08-04 NOTE — Assessment & Plan Note (Signed)
Chronic issue.  Adequately controlled.  Continue lorazepam 0.5 mg twice daily as needed for anxiety and Remeron 30 mg daily.

## 2022-08-07 ENCOUNTER — Ambulatory Visit: Payer: Medicare Other | Admitting: Physician Assistant

## 2022-08-07 ENCOUNTER — Other Ambulatory Visit: Payer: Self-pay | Admitting: Family Medicine

## 2022-08-07 DIAGNOSIS — M1A9XX1 Chronic gout, unspecified, with tophus (tophi): Secondary | ICD-10-CM

## 2022-08-09 MED ORDER — SODIUM CHLORIDE 0.9 % IV SOLN: INTRAVENOUS | Status: DC

## 2022-08-10 ENCOUNTER — Ambulatory Visit
Admission: RE | Admit: 2022-08-10 | Discharge: 2022-08-10 | Disposition: A | Discharge status: Home / Self Care | Payer: Medicare Other | Attending: Cardiovascular Disease | Admitting: Cardiovascular Disease

## 2022-08-10 ENCOUNTER — Ambulatory Visit: Payer: Medicare Other | Admitting: Anesthesiology

## 2022-08-10 ENCOUNTER — Encounter: Payer: Self-pay | Admitting: Cardiovascular Disease

## 2022-08-10 ENCOUNTER — Encounter
Admission: RE | Disposition: A | Discharge status: Home / Self Care | Payer: Self-pay | Source: Home / Self Care | Attending: Cardiovascular Disease

## 2022-08-10 DIAGNOSIS — Z7901 Long term (current) use of anticoagulants: Secondary | ICD-10-CM | POA: Diagnosis not present

## 2022-08-10 DIAGNOSIS — I4892 Unspecified atrial flutter: Secondary | ICD-10-CM | POA: Insufficient documentation

## 2022-08-10 DIAGNOSIS — I4891 Unspecified atrial fibrillation: Secondary | ICD-10-CM | POA: Diagnosis not present

## 2022-08-10 DIAGNOSIS — I110 Hypertensive heart disease with heart failure: Secondary | ICD-10-CM | POA: Diagnosis not present

## 2022-08-10 DIAGNOSIS — I4819 Other persistent atrial fibrillation: Secondary | ICD-10-CM | POA: Insufficient documentation

## 2022-08-10 DIAGNOSIS — Z79899 Other long term (current) drug therapy: Secondary | ICD-10-CM | POA: Insufficient documentation

## 2022-08-10 DIAGNOSIS — I5032 Chronic diastolic (congestive) heart failure: Secondary | ICD-10-CM | POA: Diagnosis not present

## 2022-08-10 DIAGNOSIS — I130 Hypertensive heart and chronic kidney disease with heart failure and stage 1 through stage 4 chronic kidney disease, or unspecified chronic kidney disease: Secondary | ICD-10-CM | POA: Diagnosis not present

## 2022-08-10 DIAGNOSIS — N1831 Chronic kidney disease, stage 3a: Secondary | ICD-10-CM | POA: Diagnosis not present

## 2022-08-10 DIAGNOSIS — Z87891 Personal history of nicotine dependence: Secondary | ICD-10-CM | POA: Insufficient documentation

## 2022-08-10 DIAGNOSIS — I509 Heart failure, unspecified: Secondary | ICD-10-CM | POA: Diagnosis not present

## 2022-08-10 DIAGNOSIS — D631 Anemia in chronic kidney disease: Secondary | ICD-10-CM | POA: Diagnosis not present

## 2022-08-10 HISTORY — PX: CARDIOVERSION: SHX1299

## 2022-08-10 SURGERY — CARDIOVERSION
Anesthesia: General

## 2022-08-10 MED ORDER — PROPOFOL 10 MG/ML IV BOLUS
INTRAVENOUS | Status: AC
  Filled 2022-08-10: qty 20

## 2022-08-10 MED ORDER — PROPOFOL 10 MG/ML IV BOLUS
INTRAVENOUS | Status: DC | PRN
  Administered 2022-08-10: 70 mg via INTRAVENOUS

## 2022-08-10 NOTE — Interval H&P Note (Signed)
History and Physical Interval Note:  08/10/2022 8:08 AM  [name]  has presented today for surgery, with the diagnosis of Cardioversion  Afib.  The various methods of treatment have been discussed with the patient and family. After consideration of risks, benefits and other options for treatment, the patient has consented to  Procedure(s): CARDIOVERSION (N/A) as a surgical intervention.  The patient's history has been reviewed, patient examined, no change in status, stable for surgery.  I have reviewed the patient's chart and labs.  Questions were answered to the patient's satisfaction.     Kathlyn [place]

## 2022-08-10 NOTE — Anesthesia Postprocedure Evaluation (Signed)
Anesthesia Post Note  Patient: [name]  Procedure(s) Performed: CARDIOVERSION  Patient location during evaluation: PACU Anesthesia Type: General Level of consciousness: awake and alert Pain management: pain level controlled Vital Signs Assessment: post-procedure vital signs reviewed and stable Respiratory status: spontaneous breathing, nonlabored ventilation and respiratory function stable Cardiovascular status: blood pressure returned to baseline and stable Postop Assessment: no apparent nausea or vomiting Anesthetic complications: no   No notable events documented.   Last Vitals:  Vitals:   08/10/22 0805 08/10/22 0815  BP: 126/60 (!) 143/64  Pulse: 66 70  Resp: 15 (!) 26  Temp:    SpO2: 99% 95%    Last Pain:  Vitals:   08/10/22 0815  TempSrc:   PainSc: 0-No pain                 [place] Ouch

## 2022-08-10 NOTE — CV Procedure (Signed)
Cardioversion note: A standard informed consent was obtained. Timeout was performed. The pads were placed in the anterior posterior fashion. The patient was given propofol by the anesthesia team.  Successful cardioversion was performed with a 120 J. Initial rhythm was difficult to interpret but likely was slow atypical atrial flutter.  This was discussed with Dr. Caryl Comes and Dr. Quentin Ore. The patient converted to sinus rhythm. Pre-and post EKGs were reviewed. The patient tolerated the procedure with no immediate complications.  Recommendations: Continue same medications and follow-up in 2-3 weeks.

## 2022-08-10 NOTE — Transfer of Care (Signed)
Immediate Anesthesia Transfer of Care Note  Patient: [name]  Procedure(s) Performed: CARDIOVERSION  Patient Location: PACU and Nursing Unit  Anesthesia Type:General  Level of Consciousness: awake, alert , and oriented  Airway & Oxygen Therapy: Patient Spontanous Breathing and Patient connected to nasal cannula oxygen  Post-op Assessment: Report given to RN and Post -op Vital signs reviewed and stable  Post vital signs: Reviewed and stable  Last Vitals:  Vitals Value Taken Time  BP 119/65 08/10/22 0802  Temp    Pulse 67 08/10/22 0802  Resp 18 08/10/22 0802  SpO2 98 % 08/10/22 0802  Vitals shown include unvalidated device data.  Last Pain:  Vitals:   08/10/22 0702  TempSrc: Oral  PainSc: 0-No pain         Complications: No notable events documented.

## 2022-08-10 NOTE — Anesthesia Preprocedure Evaluation (Addendum)
Anesthesia Evaluation  Patient identified by MRN, date of birth, ID band Patient awake    Reviewed: Allergy & Precautions, NPO status , Patient's Chart, lab work & pertinent test results  Airway Mallampati: II       Dental no notable dental hx.    Pulmonary shortness of breath and with exertion, former smoker   breath sounds clear to auscultation       Cardiovascular hypertension, + Peripheral Vascular Disease (s/p CAROTID ENDARTERECTOMY), +CHF and + DOE  + dysrhythmias Atrial Fibrillation  Rhythm:Irregular Rate:Normal - Peripheral Edema Recurrent AF->Flec inc 75bid; g. 09/2020 WCT->flec d/c'd->amio started.   Neuro/Psych  PSYCHIATRIC DISORDERS         GI/Hepatic Neg liver ROS,GERD  ,,  Endo/Other  negative endocrine ROS    Renal/GU Renal disease     Musculoskeletal  (+) Arthritis ,    Abdominal Normal abdominal exam  (+)   Peds  Hematology   Anesthesia Other Findings   Reproductive/Obstetrics                             Anesthesia Physical Anesthesia Plan  ASA: 3  Anesthesia Plan: General   Post-op Pain Management: Tylenol PO (pre-op)*   Induction: Intravenous  PONV Risk Score and Plan: Treatment may vary due to age or medical condition  Airway Management Planned: Natural Airway  Additional Equipment:   Intra-op Plan:   Post-operative Plan:   Informed Consent: I have reviewed the patients History and Physical, chart, labs and discussed the procedure including the risks, benefits and alternatives for the proposed anesthesia with the patient or authorized representative who has indicated his/her understanding and acceptance.     Dental advisory given  Plan Discussed with: CRNA and Anesthesiologist  Anesthesia Plan Comments:         Anesthesia Quick Evaluation

## 2022-08-11 ENCOUNTER — Encounter: Payer: Self-pay | Admitting: Cardiovascular Disease

## 2022-08-14 ENCOUNTER — Inpatient Hospital Stay
Admission: EM | Admit: 2022-08-14 | Discharge: 2022-08-26 | DRG: 199 | Disposition: A | Discharge status: Skilled Nursing Facility | Payer: Medicare Other | Attending: Student in an Organized Health Care Education/Training Program | Admitting: Student in an Organized Health Care Education/Training Program

## 2022-08-14 ENCOUNTER — Inpatient Hospital Stay: Payer: Medicare Other

## 2022-08-14 ENCOUNTER — Emergency Department: Payer: Medicare Other

## 2022-08-14 DIAGNOSIS — Z681 Body mass index (BMI) 19 or less, adult: Secondary | ICD-10-CM | POA: Diagnosis not present

## 2022-08-14 DIAGNOSIS — I5032 Chronic diastolic (congestive) heart failure: Secondary | ICD-10-CM | POA: Diagnosis present

## 2022-08-14 DIAGNOSIS — S27322A Contusion of lung, bilateral, initial encounter: Secondary | ICD-10-CM | POA: Diagnosis not present

## 2022-08-14 DIAGNOSIS — F1010 Alcohol abuse, uncomplicated: Secondary | ICD-10-CM | POA: Diagnosis present

## 2022-08-14 DIAGNOSIS — I959 Hypotension, unspecified: Secondary | ICD-10-CM | POA: Diagnosis present

## 2022-08-14 DIAGNOSIS — E785 Hyperlipidemia, unspecified: Secondary | ICD-10-CM | POA: Diagnosis present

## 2022-08-14 DIAGNOSIS — N179 Acute kidney failure, unspecified: Secondary | ICD-10-CM | POA: Diagnosis not present

## 2022-08-14 DIAGNOSIS — S3991XA Unspecified injury of abdomen, initial encounter: Secondary | ICD-10-CM | POA: Diagnosis not present

## 2022-08-14 DIAGNOSIS — S2243XA Multiple fractures of ribs, bilateral, initial encounter for closed fracture: Secondary | ICD-10-CM | POA: Diagnosis not present

## 2022-08-14 DIAGNOSIS — M6281 Muscle weakness (generalized): Secondary | ICD-10-CM | POA: Diagnosis not present

## 2022-08-14 DIAGNOSIS — Z515 Encounter for palliative care: Secondary | ICD-10-CM | POA: Diagnosis not present

## 2022-08-14 DIAGNOSIS — J90 Pleural effusion, not elsewhere classified: Secondary | ICD-10-CM | POA: Diagnosis not present

## 2022-08-14 DIAGNOSIS — E876 Hypokalemia: Secondary | ICD-10-CM | POA: Diagnosis not present

## 2022-08-14 DIAGNOSIS — R9431 Abnormal electrocardiogram [ECG] [EKG]: Secondary | ICD-10-CM | POA: Diagnosis not present

## 2022-08-14 DIAGNOSIS — D539 Nutritional anemia, unspecified: Secondary | ICD-10-CM | POA: Diagnosis not present

## 2022-08-14 DIAGNOSIS — W19XXXA Unspecified fall, initial encounter: Secondary | ICD-10-CM | POA: Diagnosis present

## 2022-08-14 DIAGNOSIS — S2242XK Multiple fractures of ribs, left side, subsequent encounter for fracture with nonunion: Secondary | ICD-10-CM | POA: Diagnosis not present

## 2022-08-14 DIAGNOSIS — I5031 Acute diastolic (congestive) heart failure: Secondary | ICD-10-CM | POA: Diagnosis not present

## 2022-08-14 DIAGNOSIS — F32A Depression, unspecified: Secondary | ICD-10-CM | POA: Diagnosis present

## 2022-08-14 DIAGNOSIS — L89101 Pressure ulcer of unspecified part of back, stage 1: Secondary | ICD-10-CM | POA: Diagnosis present

## 2022-08-14 DIAGNOSIS — I130 Hypertensive heart and chronic kidney disease with heart failure and stage 1 through stage 4 chronic kidney disease, or unspecified chronic kidney disease: Secondary | ICD-10-CM | POA: Diagnosis present

## 2022-08-14 DIAGNOSIS — I482 Chronic atrial fibrillation, unspecified: Secondary | ICD-10-CM | POA: Diagnosis present

## 2022-08-14 DIAGNOSIS — Z7189 Other specified counseling: Secondary | ICD-10-CM | POA: Diagnosis not present

## 2022-08-14 DIAGNOSIS — Z789 Other specified health status: Secondary | ICD-10-CM | POA: Diagnosis present

## 2022-08-14 DIAGNOSIS — I5033 Acute on chronic diastolic (congestive) heart failure: Secondary | ICD-10-CM | POA: Diagnosis not present

## 2022-08-14 DIAGNOSIS — S271XXA Traumatic hemothorax, initial encounter: Secondary | ICD-10-CM | POA: Diagnosis present

## 2022-08-14 DIAGNOSIS — R1312 Dysphagia, oropharyngeal phase: Secondary | ICD-10-CM | POA: Diagnosis not present

## 2022-08-14 DIAGNOSIS — J939 Pneumothorax, unspecified: Secondary | ICD-10-CM | POA: Diagnosis not present

## 2022-08-14 DIAGNOSIS — W010XXA Fall on same level from slipping, tripping and stumbling without subsequent striking against object, initial encounter: Secondary | ICD-10-CM | POA: Diagnosis present

## 2022-08-14 DIAGNOSIS — M4856XA Collapsed vertebra, not elsewhere classified, lumbar region, initial encounter for fracture: Secondary | ICD-10-CM | POA: Diagnosis present

## 2022-08-14 DIAGNOSIS — J9601 Acute respiratory failure with hypoxia: Secondary | ICD-10-CM | POA: Diagnosis present

## 2022-08-14 DIAGNOSIS — R079 Chest pain, unspecified: Secondary | ICD-10-CM | POA: Diagnosis not present

## 2022-08-14 DIAGNOSIS — Z79899 Other long term (current) drug therapy: Secondary | ICD-10-CM

## 2022-08-14 DIAGNOSIS — Z743 Need for continuous supervision: Secondary | ICD-10-CM | POA: Diagnosis not present

## 2022-08-14 DIAGNOSIS — I4821 Permanent atrial fibrillation: Secondary | ICD-10-CM | POA: Diagnosis present

## 2022-08-14 DIAGNOSIS — Y92009 Unspecified place in unspecified non-institutional (private) residence as the place of occurrence of the external cause: Secondary | ICD-10-CM | POA: Diagnosis not present

## 2022-08-14 DIAGNOSIS — Z853 Personal history of malignant neoplasm of breast: Secondary | ICD-10-CM

## 2022-08-14 DIAGNOSIS — F418 Other specified anxiety disorders: Secondary | ICD-10-CM | POA: Diagnosis present

## 2022-08-14 DIAGNOSIS — Z96641 Presence of right artificial hip joint: Secondary | ICD-10-CM | POA: Diagnosis present

## 2022-08-14 DIAGNOSIS — S2249XD Multiple fractures of ribs, unspecified side, subsequent encounter for fracture with routine healing: Secondary | ICD-10-CM | POA: Diagnosis not present

## 2022-08-14 DIAGNOSIS — Z806 Family history of leukemia: Secondary | ICD-10-CM

## 2022-08-14 DIAGNOSIS — F419 Anxiety disorder, unspecified: Secondary | ICD-10-CM | POA: Diagnosis present

## 2022-08-14 DIAGNOSIS — S298XXA Other specified injuries of thorax, initial encounter: Secondary | ICD-10-CM | POA: Diagnosis present

## 2022-08-14 DIAGNOSIS — M109 Gout, unspecified: Secondary | ICD-10-CM | POA: Diagnosis present

## 2022-08-14 DIAGNOSIS — S2241XA Multiple fractures of ribs, right side, initial encounter for closed fracture: Secondary | ICD-10-CM

## 2022-08-14 DIAGNOSIS — S22069A Unspecified fracture of T7-T8 vertebra, initial encounter for closed fracture: Secondary | ICD-10-CM | POA: Diagnosis not present

## 2022-08-14 DIAGNOSIS — J9690 Respiratory failure, unspecified, unspecified whether with hypoxia or hypercapnia: Secondary | ICD-10-CM | POA: Diagnosis not present

## 2022-08-14 DIAGNOSIS — J9811 Atelectasis: Secondary | ICD-10-CM | POA: Diagnosis not present

## 2022-08-14 DIAGNOSIS — K219 Gastro-esophageal reflux disease without esophagitis: Secondary | ICD-10-CM | POA: Diagnosis present

## 2022-08-14 DIAGNOSIS — R0689 Other abnormalities of breathing: Secondary | ICD-10-CM | POA: Diagnosis not present

## 2022-08-14 DIAGNOSIS — R0789 Other chest pain: Secondary | ICD-10-CM | POA: Diagnosis not present

## 2022-08-14 DIAGNOSIS — N1831 Chronic kidney disease, stage 3a: Secondary | ICD-10-CM | POA: Diagnosis present

## 2022-08-14 DIAGNOSIS — S22079A Unspecified fracture of T9-T10 vertebra, initial encounter for closed fracture: Secondary | ICD-10-CM | POA: Diagnosis not present

## 2022-08-14 DIAGNOSIS — Q438 Other specified congenital malformations of intestine: Secondary | ICD-10-CM | POA: Diagnosis not present

## 2022-08-14 DIAGNOSIS — R0902 Hypoxemia: Secondary | ICD-10-CM | POA: Diagnosis not present

## 2022-08-14 DIAGNOSIS — S2249XA Multiple fractures of ribs, unspecified side, initial encounter for closed fracture: Secondary | ICD-10-CM | POA: Diagnosis present

## 2022-08-14 DIAGNOSIS — K8689 Other specified diseases of pancreas: Secondary | ICD-10-CM | POA: Diagnosis not present

## 2022-08-14 DIAGNOSIS — R52 Pain, unspecified: Secondary | ICD-10-CM | POA: Diagnosis not present

## 2022-08-14 DIAGNOSIS — Z66 Do not resuscitate: Secondary | ICD-10-CM | POA: Diagnosis present

## 2022-08-14 DIAGNOSIS — R531 Weakness: Secondary | ICD-10-CM | POA: Diagnosis not present

## 2022-08-14 DIAGNOSIS — D472 Monoclonal gammopathy: Secondary | ICD-10-CM | POA: Diagnosis present

## 2022-08-14 DIAGNOSIS — R069 Unspecified abnormalities of breathing: Secondary | ICD-10-CM | POA: Diagnosis not present

## 2022-08-14 DIAGNOSIS — L8990 Pressure ulcer of unspecified site, unspecified stage: Secondary | ICD-10-CM | POA: Diagnosis present

## 2022-08-14 DIAGNOSIS — I700 Atherosclerosis of aorta: Secondary | ICD-10-CM | POA: Diagnosis present

## 2022-08-14 DIAGNOSIS — Z7901 Long term (current) use of anticoagulants: Secondary | ICD-10-CM

## 2022-08-14 DIAGNOSIS — S272XXA Traumatic hemopneumothorax, initial encounter: Secondary | ICD-10-CM | POA: Diagnosis present

## 2022-08-14 DIAGNOSIS — R636 Underweight: Secondary | ICD-10-CM | POA: Diagnosis not present

## 2022-08-14 DIAGNOSIS — Z87891 Personal history of nicotine dependence: Secondary | ICD-10-CM

## 2022-08-14 DIAGNOSIS — J942 Hemothorax: Secondary | ICD-10-CM | POA: Diagnosis present

## 2022-08-14 DIAGNOSIS — R2689 Other abnormalities of gait and mobility: Secondary | ICD-10-CM | POA: Diagnosis not present

## 2022-08-14 DIAGNOSIS — F1090 Alcohol use, unspecified, uncomplicated: Secondary | ICD-10-CM | POA: Diagnosis present

## 2022-08-14 DIAGNOSIS — R278 Other lack of coordination: Secondary | ICD-10-CM | POA: Diagnosis not present

## 2022-08-14 DIAGNOSIS — J181 Lobar pneumonia, unspecified organism: Secondary | ICD-10-CM | POA: Diagnosis not present

## 2022-08-14 DIAGNOSIS — R0781 Pleurodynia: Secondary | ICD-10-CM | POA: Diagnosis not present

## 2022-08-14 DIAGNOSIS — S270XXA Traumatic pneumothorax, initial encounter: Secondary | ICD-10-CM | POA: Diagnosis not present

## 2022-08-14 DIAGNOSIS — J440 Chronic obstructive pulmonary disease with acute lower respiratory infection: Secondary | ICD-10-CM | POA: Diagnosis present

## 2022-08-14 DIAGNOSIS — I10 Essential (primary) hypertension: Secondary | ICD-10-CM | POA: Diagnosis present

## 2022-08-14 DIAGNOSIS — Z8249 Family history of ischemic heart disease and other diseases of the circulatory system: Secondary | ICD-10-CM

## 2022-08-14 DIAGNOSIS — Z923 Personal history of irradiation: Secondary | ICD-10-CM

## 2022-08-14 DIAGNOSIS — R0602 Shortness of breath: Secondary | ICD-10-CM | POA: Diagnosis not present

## 2022-08-14 DIAGNOSIS — S2231XA Fracture of one rib, right side, initial encounter for closed fracture: Secondary | ICD-10-CM | POA: Diagnosis not present

## 2022-08-14 LAB — COMPREHENSIVE METABOLIC PANEL
ALT: 28 U/L (ref 0–44)
AST: 53 U/L — ABNORMAL HIGH (ref 15–41)
Albumin: 3.5 g/dL (ref 3.5–5.0)
Alkaline Phosphatase: 66 U/L (ref 38–126)
Anion gap: 13 (ref 5–15)
BUN: 25 mg/dL — ABNORMAL HIGH (ref 8–23)
CO2: 23 mmol/L (ref 22–32)
Calcium: 8.6 mg/dL — ABNORMAL LOW (ref 8.9–10.3)
Chloride: 103 mmol/L (ref 98–111)
Creatinine, Ser: 1.06 mg/dL — ABNORMAL HIGH (ref 0.44–1.00)
GFR, Estimated: 52 mL/min — ABNORMAL LOW (ref >60)
Glucose, Bld: 122 mg/dL — ABNORMAL HIGH (ref 70–99)
Potassium: 3.4 mmol/L — ABNORMAL LOW (ref 3.5–5.1)
Sodium: 139 mmol/L (ref 135–145)
Total Bilirubin: 0.5 mg/dL (ref 0.3–1.2)
Total Protein: 6.8 g/dL (ref 6.5–8.1)

## 2022-08-14 LAB — CBC WITH DIFFERENTIAL/PLATELET
Abs Immature Granulocytes: 0.03 10*3/uL (ref 0.00–0.07)
Basophils Absolute: 0 10*3/uL (ref 0.0–0.1)
Basophils Relative: 0 %
Eosinophils Absolute: 0.1 10*3/uL (ref 0.0–0.5)
Eosinophils Relative: 1 %
HCT: 32.4 % — ABNORMAL LOW (ref 36.0–46.0)
Hemoglobin: 10.7 g/dL — ABNORMAL LOW (ref 12.0–15.0)
Immature Granulocytes: 0 %
Lymphocytes Relative: 14 %
Lymphs Abs: 1.3 10*3/uL (ref 0.7–4.0)
MCH: 35 pg — ABNORMAL HIGH (ref 26.0–34.0)
MCHC: 33 g/dL (ref 30.0–36.0)
MCV: 105.9 fL — ABNORMAL HIGH (ref 80.0–100.0)
Monocytes Absolute: 0.9 10*3/uL (ref 0.1–1.0)
Monocytes Relative: 9 %
Neutro Abs: 6.8 10*3/uL (ref 1.7–7.7)
Neutrophils Relative %: 76 %
Platelets: 230 10*3/uL (ref 150–400)
RBC: 3.06 MIL/uL — ABNORMAL LOW (ref 3.87–5.11)
RDW: 14.3 % (ref 11.5–15.5)
WBC: 9.1 10*3/uL (ref 4.0–10.5)
nRBC: 0 % (ref 0.0–0.2)

## 2022-08-14 LAB — HEMOGLOBIN AND HEMATOCRIT, BLOOD
HCT: 32.1 % — ABNORMAL LOW (ref 36.0–46.0)
HCT: 33.3 % — ABNORMAL LOW (ref 36.0–46.0)
HCT: 32.3 % — ABNORMAL LOW (ref 36.0–46.0)
Hemoglobin: 10.5 g/dL — ABNORMAL LOW (ref 12.0–15.0)
Hemoglobin: 10.8 g/dL — ABNORMAL LOW (ref 12.0–15.0)
Hemoglobin: 10.6 g/dL — ABNORMAL LOW (ref 12.0–15.0)

## 2022-08-14 LAB — PROTIME-INR
INR: 1.2 (ref 0.8–1.2)
Prothrombin Time: 14.8 seconds (ref 11.4–15.2)

## 2022-08-14 LAB — BRAIN NATRIURETIC PEPTIDE: B Natriuretic Peptide: 121.7 pg/mL — ABNORMAL HIGH (ref 0.0–100.0)

## 2022-08-14 LAB — TROPONIN I (HIGH SENSITIVITY)
Troponin I (High Sensitivity): 8 ng/L (ref <18)
Troponin I (High Sensitivity): 5 ng/L (ref <18)

## 2022-08-14 LAB — APTT: aPTT: 28 seconds (ref 24–36)

## 2022-08-14 MED ORDER — AMIODARONE HCL 200 MG PO TABS
200.0000 mg | ORAL_TABLET | Freq: Two times a day (BID) | ORAL | Status: DC
  Administered 2022-08-14 – 2022-08-26 (×25): 200 mg via ORAL
  Filled 2022-08-14 (×25): qty 1

## 2022-08-14 MED ORDER — MORPHINE SULFATE (PF) 2 MG/ML IV SOLN
2.0000 mg | Freq: Once | INTRAVENOUS | Status: AC
  Administered 2022-08-14: 2 mg via INTRAVENOUS
  Filled 2022-08-14: qty 1

## 2022-08-14 MED ORDER — METHOCARBAMOL 500 MG PO TABS
500.0000 mg | ORAL_TABLET | Freq: Three times a day (TID) | ORAL | Status: DC | PRN
  Administered 2022-08-15 – 2022-08-16 (×3): 500 mg via ORAL
  Filled 2022-08-14 (×3): qty 1

## 2022-08-14 MED ORDER — GABAPENTIN 100 MG PO CAPS
100.0000 mg | ORAL_CAPSULE | Freq: Three times a day (TID) | ORAL | Status: DC
  Administered 2022-08-14 – 2022-08-26 (×37): 100 mg via ORAL
  Filled 2022-08-14 (×37): qty 1

## 2022-08-14 MED ORDER — LORAZEPAM 2 MG/ML IJ SOLN: 1.0000 mg | INTRAMUSCULAR | Status: AC | PRN

## 2022-08-14 MED ORDER — ACETAMINOPHEN 325 MG RE SUPP: 650.0000 mg | Freq: Four times a day (QID) | RECTAL | Status: DC | PRN

## 2022-08-14 MED ORDER — THIAMINE MONONITRATE 100 MG PO TABS
100.0000 mg | ORAL_TABLET | Freq: Every day | ORAL | Status: DC
  Administered 2022-08-14 – 2022-08-25 (×12): 100 mg via ORAL
  Filled 2022-08-14 (×13): qty 1

## 2022-08-14 MED ORDER — OXYCODONE-ACETAMINOPHEN 5-325 MG PO TABS: 1.0000 | ORAL_TABLET | ORAL | Status: DC | PRN

## 2022-08-14 MED ORDER — LORAZEPAM 2 MG/ML IJ SOLN: 0.0000 mg | Freq: Four times a day (QID) | INTRAMUSCULAR | Status: AC

## 2022-08-14 MED ORDER — ADULT MULTIVITAMIN W/MINERALS CH
1.0000 | ORAL_TABLET | Freq: Every day | ORAL | Status: DC
  Administered 2022-08-14 – 2022-08-26 (×13): 1 via ORAL
  Filled 2022-08-14 (×13): qty 1

## 2022-08-14 MED ORDER — THIAMINE HCL 100 MG/ML IJ SOLN: 100.0000 mg | Freq: Every day | INTRAMUSCULAR | Status: DC

## 2022-08-14 MED ORDER — MORPHINE SULFATE (PF) 2 MG/ML IV SOLN
1.0000 mg | INTRAVENOUS | Status: DC | PRN
  Administered 2022-08-17 – 2022-08-20 (×9): 1 mg via INTRAVENOUS
  Filled 2022-08-14 (×10): qty 1

## 2022-08-14 MED ORDER — TRAZODONE HCL 50 MG PO TABS
25.0000 mg | ORAL_TABLET | Freq: Every evening | ORAL | Status: DC | PRN
  Administered 2022-08-15 – 2022-08-24 (×5): 25 mg via ORAL
  Filled 2022-08-14 (×5): qty 1

## 2022-08-14 MED ORDER — ACETAMINOPHEN 325 MG PO TABS: 650.0000 mg | ORAL_TABLET | Freq: Four times a day (QID) | ORAL | Status: DC | PRN

## 2022-08-14 MED ORDER — ONDANSETRON HCL 4 MG/2ML IJ SOLN: 4.0000 mg | Freq: Four times a day (QID) | INTRAMUSCULAR | Status: DC | PRN

## 2022-08-14 MED ORDER — SODIUM CHLORIDE 0.9 % IV BOLUS
1000.0000 mL | Freq: Once | INTRAVENOUS | Status: AC
  Administered 2022-08-14: 1000 mL via INTRAVENOUS

## 2022-08-14 MED ORDER — LORAZEPAM 1 MG PO TABS: 1.0000 mg | ORAL_TABLET | ORAL | Status: AC | PRN

## 2022-08-14 MED ORDER — MORPHINE SULFATE (PF) 2 MG/ML IV SOLN
2.0000 mg | INTRAVENOUS | Status: DC | PRN
  Administered 2022-08-14: 2 mg via INTRAVENOUS
  Filled 2022-08-14: qty 1

## 2022-08-14 MED ORDER — LORAZEPAM 0.5 MG PO TABS
0.5000 mg | ORAL_TABLET | Freq: Two times a day (BID) | ORAL | Status: DC | PRN
  Administered 2022-08-14 – 2022-08-21 (×2): 0.5 mg via ORAL
  Filled 2022-08-14 (×2): qty 1

## 2022-08-14 MED ORDER — SODIUM CHLORIDE 0.9 % IV SOLN: INTRAVENOUS | Status: DC

## 2022-08-14 MED ORDER — ALLOPURINOL 100 MG PO TABS
150.0000 mg | ORAL_TABLET | Freq: Every day | ORAL | Status: DC
  Administered 2022-08-14 – 2022-08-26 (×13): 150 mg via ORAL
  Filled 2022-08-14 (×10): qty 2
  Filled 2022-08-14: qty 0.5
  Filled 2022-08-14 (×2): qty 2

## 2022-08-14 MED ORDER — ACETAMINOPHEN 500 MG PO TABS
1000.0000 mg | ORAL_TABLET | Freq: Four times a day (QID) | ORAL | Status: DC
  Administered 2022-08-14 – 2022-08-26 (×36): 1000 mg via ORAL
  Filled 2022-08-14 (×40): qty 2

## 2022-08-14 MED ORDER — FUROSEMIDE 40 MG PO TABS
20.0000 mg | ORAL_TABLET | Freq: Every day | ORAL | Status: DC
  Administered 2022-08-14: 20 mg via ORAL
  Filled 2022-08-14: qty 1

## 2022-08-14 MED ORDER — LORAZEPAM 2 MG/ML PO CONC
0.5000 mg | ORAL | Status: DC | PRN
  Administered 2022-08-19: 0.5 mg via ORAL
  Filled 2022-08-14: qty 1

## 2022-08-14 MED ORDER — LIDOCAINE 5 % EX PTCH
1.0000 | MEDICATED_PATCH | CUTANEOUS | Status: DC
  Administered 2022-08-14 – 2022-08-26 (×13): 1 via TRANSDERMAL
  Filled 2022-08-14 (×14): qty 1

## 2022-08-14 MED ORDER — POTASSIUM CHLORIDE IN NACL 20-0.9 MEQ/L-% IV SOLN
INTRAVENOUS | Status: DC
  Filled 2022-08-14: qty 1000

## 2022-08-14 MED ORDER — POTASSIUM CHLORIDE CRYS ER 20 MEQ PO TBCR
40.0000 meq | EXTENDED_RELEASE_TABLET | Freq: Once | ORAL | Status: AC
  Administered 2022-08-14: 40 meq via ORAL
  Filled 2022-08-14: qty 2

## 2022-08-14 MED ORDER — MIRTAZAPINE 15 MG PO TABS
30.0000 mg | ORAL_TABLET | Freq: Every day | ORAL | Status: DC
  Administered 2022-08-14 – 2022-08-25 (×12): 30 mg via ORAL
  Filled 2022-08-14 (×12): qty 2

## 2022-08-14 MED ORDER — IOHEXOL 300 MG/ML  SOLN
75.0000 mL | Freq: Once | INTRAMUSCULAR | Status: AC | PRN
  Administered 2022-08-14: 75 mL via INTRAVENOUS

## 2022-08-14 MED ORDER — ONDANSETRON HCL 4 MG PO TABS: 4.0000 mg | ORAL_TABLET | Freq: Four times a day (QID) | ORAL | Status: DC | PRN

## 2022-08-14 MED ORDER — DIAZEPAM 5 MG/ML IJ SOLN
0.5000 mg | Freq: Once | INTRAMUSCULAR | Status: AC
  Administered 2022-08-14: 0.5 mg via INTRAVENOUS
  Filled 2022-08-14: qty 2

## 2022-08-14 MED ORDER — ONDANSETRON HCL 4 MG/2ML IJ SOLN
4.0000 mg | Freq: Once | INTRAMUSCULAR | Status: AC
  Administered 2022-08-14: 4 mg via INTRAVENOUS
  Filled 2022-08-14: qty 2

## 2022-08-14 MED ORDER — FOLIC ACID 1 MG PO TABS
1.0000 mg | ORAL_TABLET | Freq: Every day | ORAL | Status: DC
  Administered 2022-08-14 – 2022-08-25 (×11): 1 mg via ORAL
  Filled 2022-08-14 (×13): qty 1

## 2022-08-14 MED ORDER — VITAMIN D 25 MCG (1000 UNIT) PO TABS
2000.0000 [IU] | ORAL_TABLET | Freq: Every day | ORAL | Status: DC
  Administered 2022-08-14 – 2022-08-26 (×13): 2000 [IU] via ORAL
  Filled 2022-08-14 (×13): qty 2

## 2022-08-14 MED ORDER — ADULT MULTIVITAMIN W/MINERALS CH: 1.0000 | ORAL_TABLET | Freq: Every day | ORAL | Status: DC

## 2022-08-14 MED ORDER — OXYCODONE HCL 5 MG PO TABS
5.0000 mg | ORAL_TABLET | ORAL | Status: DC | PRN
  Administered 2022-08-14 (×2): 5 mg via ORAL
  Administered 2022-08-14 – 2022-08-15 (×2): 10 mg via ORAL
  Administered 2022-08-15: 5 mg via ORAL
  Administered 2022-08-15 – 2022-08-16 (×2): 10 mg via ORAL
  Administered 2022-08-16 – 2022-08-17 (×2): 5 mg via ORAL
  Administered 2022-08-17: 10 mg via ORAL
  Administered 2022-08-17: 5 mg via ORAL
  Administered 2022-08-18: 10 mg via ORAL
  Administered 2022-08-19 (×2): 5 mg via ORAL
  Administered 2022-08-19 – 2022-08-20 (×2): 10 mg via ORAL
  Administered 2022-08-20 – 2022-08-22 (×5): 5 mg via ORAL
  Administered 2022-08-22 (×2): 10 mg via ORAL
  Administered 2022-08-23 – 2022-08-24 (×4): 5 mg via ORAL
  Filled 2022-08-14 (×2): qty 2
  Filled 2022-08-14 (×4): qty 1
  Filled 2022-08-14: qty 2
  Filled 2022-08-14 (×2): qty 1
  Filled 2022-08-14 (×2): qty 2
  Filled 2022-08-14 (×6): qty 1
  Filled 2022-08-14 (×3): qty 2
  Filled 2022-08-14 (×2): qty 1
  Filled 2022-08-14: qty 2
  Filled 2022-08-14 (×4): qty 1
  Filled 2022-08-14 (×2): qty 2
  Filled 2022-08-14: qty 1
  Filled 2022-08-14: qty 2

## 2022-08-14 MED ORDER — AMLODIPINE BESYLATE 5 MG PO TABS
5.0000 mg | ORAL_TABLET | Freq: Every day | ORAL | Status: DC
  Administered 2022-08-14: 5 mg via ORAL
  Filled 2022-08-14: qty 1

## 2022-08-14 MED ORDER — MAGNESIUM HYDROXIDE 400 MG/5ML PO SUSP: 30.0000 mL | Freq: Every day | ORAL | Status: DC | PRN

## 2022-08-14 NOTE — ED Triage Notes (Signed)
Pt bib ems from home, states she tripped and fell earlier tonight, denies hitting head or LOC. Pt went to sleep and states R trunk pain woke her up out of her sleep, states it is a spasm-like pain. EMS gave 83mg fentanyl en route.

## 2022-08-14 NOTE — ED Notes (Signed)
Semi-private roomed discussed with pt and pt encouraged to take the room pinned to her at this time due to bed availability, pt educated we would ask to put her on wait list  for private room, pt initially denied but charge RN did speak to pt and pt is agreeable at this time.

## 2022-08-14 NOTE — ED Notes (Signed)
Lab at bedside

## 2022-08-14 NOTE — ED Triage Notes (Signed)
Pt bib ems from home, states she tripped and fell earlier tonight, denies hitting head or LOC. Pt went to sleep and states R trunk pain woke her up out of her sleep, states it is a spasm-like pain.

## 2022-08-14 NOTE — Consult Note (Signed)
Date of Consultation:  08/14/2022  Requesting Physician:  Lurline Hare, MD  Reason for Consultation:  Right hemo-pneumothorax  History of Present Illness: [name] is a 82 y.o. female presenting after a fall at home.  She was using her walker and she's not sure how she fell.  She hit her right side and her right elbow had an abrasion.  Workup in the ED showed CXR and CT scans with multiple right sided rib fractures, with small hemo-pneumothorax.  She is on Eliquis for atrial fibrillation and just had a cardioversion this week with Dr. Fletcher Anon.  She's currently having pain in the right side.  Past Medical History: Past Medical History:  Diagnosis Date   (HFpEF) heart failure with preserved ejection fraction ([place])    a. 08/2017 Echo: EF 55-60%, no rwma, mild MR, mildly dil LA, nl RV fxn; b. 03/2020 Echo: EF 60-65%, no rwma, Gr2 DD. RVSP 44.4mHg. Mod dil LA. Mild MR; c. 09/2020 Echo: EF 50-55%, no rwma, mild LVH, mod red RV fxn, mildly dily RA.   Acute CHF (congestive heart failure) ([facility] 09/18/2020   Alcohol abuse 04/05/2020   Breast cancer ([facility] 2001   left breast   Cancer ([facility] 2001   left breast ca   Carotid arterial disease ([facility]    a. 10/2004 s/p L CEA; b. 12/2015 Carotid U/S: RICA 1-39%; b. LICA patent CEA site.   Closed right hip fracture ([facility] 09/20/2019   GERD (gastroesophageal reflux disease)    Hyperlipidemia    Hypertension    Osteoarthritis, multiple sites    Osteopenia    Persistent atrial fibrillation ([facility]    a. Dx 08/2017; b. CHA2DS2VASc = 6-->Pradaxa; c. 09/2017 Successful DCCV (second shock - 200J); d. 10/2017 Recurrent Afib-->flecainide started 11/2017; e. 06/2020 s/p DCCV (200J x 1); f. 06/2020 Recurrent AF->Flec inc 75bid; g. 09/2020 WCT->flec d/c'd->amio started.   Personal history of radiation therapy 2001   left breast ca   Psoriasis    Wide-complex tachycardia    a. 09/2020 in setting of presumed Flecainide toxicity.  Flecainide d/c'd.     Past Surgical  History: Past Surgical History:  Procedure Laterality Date   ABDOMINAL HYSTERECTOMY     ANKLE FRACTURE SURGERY  4/08   left---hardware still in place   ATRIAL FIBRILLATION ABLATION N/A 05/01/2022   Procedure: ATRIAL FIBRILLATION ABLATION;  Surgeon: LVickie Epley MD;  [facility];  Service: Cardiovascular;  Laterality: N/A;   BREAST BIOPSY Left 2001   breast ca   BREAST EXCISIONAL BIOPSY Left yrs ago   benign   BREAST LUMPECTOMY Left 2001   f/u radiation   CARDIOVERSION N/A 10/04/2017   Procedure: CARDIOVERSION;  Surgeon: [name] MD;  [facility];  Service: Cardiovascular;  Laterality: N/A;   CARDIOVERSION N/A 12/16/2017   Procedure: CARDIOVERSION;  Surgeon: [name] MD;  Location: [facility];  Service: Cardiovascular;  Laterality: N/A;   CARDIOVERSION N/A 06/24/2020   Procedure: CARDIOVERSION;  Surgeon: [name] MD;  Location: [facility];  Service: Cardiovascular;  Laterality: N/A;   CARDIOVERSION N/A 08/10/2022   Procedure: CARDIOVERSION;  Surgeon: [name] MD;  Location: [facility];  Service: Cardiovascular;  Laterality: N/A;   CAROTID ENDARTERECTOMY Left 10/23/2004   FRACTURE SURGERY     OOPHORECTOMY     SHOULDER SURGERY  6/07   left   TONSILLECTOMY AND ADENOIDECTOMY     TOTAL HIP ARTHROPLASTY  2004   right    Home Medications: Prior  to Admission medications   Medication Sig Start Date End Date Taking? Authorizing Provider  acetaminophen (TYLENOL) 325 MG tablet Take 2 tablets (650 mg total) by mouth every 4 (four) hours as needed for headache or mild pain. 09/19/20   Swayze, Ava, DO  allopurinol (ZYLOPRIM) 100 MG tablet TAKE 1.5 TABLETS (150 MG TOTAL) BY MOUTH DAILY 08/07/22   [name], MD  amiodarone (PACERONE) 200 MG tablet Take 1 tablet (200 mg total) by mouth 2 (two) times daily. 07/27/22   Fenton, Clint R, PA  amLODipine (NORVASC) 5 MG tablet Take 5 mg by mouth daily.    [provider]  apixaban  (ELIQUIS) 2.5 MG TABS tablet TAKE 1 TABLET BY MOUTH TWICE A DAY 05/13/22   Vickie Epley, MD  cholecalciferol (VITAMIN D) 25 MCG (1000 UNIT) tablet Take 2 tablets (2,000 Units total) by mouth daily. 09/20/20   Swayze, Ava, DO  furosemide (LASIX) 20 MG tablet TAKE 1 TABLET BY MOUTH EVERY DAY 07/29/22   [name], MD  furosemide (LASIX) 40 MG tablet TAKE 0.5 TABLETS (20 MG TOTAL) BY MOUTH EVERY OTHER DAY. 07/29/22   [name], MD  LORazepam (ATIVAN) 0.5 MG tablet TAKE 1 TABLET BY MOUTH TWICE A DAY AS NEEDED FOR ANXIETY 06/09/22   [name], MD  mirtazapine (REMERON) 30 MG tablet Take 1 tablet (30 mg total) by mouth at bedtime. 06/09/22   [name], MD  Multiple Vitamin (MULTIVITAMIN) capsule Take 1 capsule by mouth daily.    [provider]  Multiple Vitamins-Minerals (PRESERVISION AREDS 2+MULTI VIT PO) Take 1 capsule by mouth in the morning and at bedtime.    [provider]    Allergies: No Known Allergies  Social History:  reports that she quit smoking about 27 years ago. Her smoking use included cigarettes. She has a 40.00 pack-year smoking history. She has never used smokeless tobacco. She reports current alcohol use of about 14.0 standard drinks of alcohol per week. She reports that she does not use drugs.   Family History: Family History  Problem Relation Age of Onset   Hypertension Brother    Pneumonia Mother    Leukemia Father    Heart disease Neg Hx    Diabetes Neg Hx    Breast cancer Neg Hx     Review of Systems: Review of Systems  Constitutional:  Negative for chills and fever.  Respiratory:  Positive for shortness of breath.   Cardiovascular:  Negative for chest pain.  Gastrointestinal:  Negative for abdominal pain, nausea and vomiting.  Genitourinary:  Negative for dysuria.  Musculoskeletal:  Positive for myalgias.  Skin:  Negative for rash.  Neurological:  Negative for dizziness.  Psychiatric/Behavioral:  Negative for  depression.     Physical Exam BP 107/73   Pulse 65   Temp 98.3 F (36.8 C) (Oral)   Resp 16   Wt 52 kg   SpO2 99%   BMI 17.96 kg/m  CONSTITUTIONAL: No acute distress, well nourished. HEENT:  Normocephalic, atraumatic, extraocular motion intact. NECK: Trachea is midline, and there is no jugular venous distension. RESPIRATORY:  Normal respiratory effort without pathologic use of accessory muscles, but with pain with deep inspiration. CARDIOVASCULAR: Irregular rhythm, regular rate. GI: The abdomen is soft, non-distended, non-tender.  MUSCULOSKELETAL:  Normal muscle strength and tone in all four extremities.  No peripheral edema or cyanosis. SKIN: Small abrasion in right elbow with loose skin.  NEUROLOGIC:  Motor and sensation is grossly normal.  Cranial nerves are grossly intact. PSYCH:  Alert and oriented to person, place and time. Affect is normal.  Laboratory Analysis: Results for orders placed or performed during the hospital encounter of 08/14/22 (from the past 24 hour(s))  CBC with Differential     Status: Abnormal   Collection Time: 08/14/22  2:31 AM  Result Value Ref Range   WBC 9.1 4.0 - 10.5 K/uL   RBC 3.06 (L) 3.87 - 5.11 MIL/uL   Hemoglobin 10.7 (L) 12.0 - 15.0 g/dL   HCT 32.4 (L) 36.0 - 46.0 %   MCV 105.9 (H) 80.0 - 100.0 fL   MCH 35.0 (H) 26.0 - 34.0 pg   MCHC 33.0 30.0 - 36.0 g/dL   RDW 14.3 11.5 - 15.5 %   Platelets 230 150 - 400 K/uL   nRBC 0.0 0.0 - 0.2 %   Neutrophils Relative % 76 %   Neutro Abs 6.8 1.7 - 7.7 K/uL   Lymphocytes Relative 14 %   Lymphs Abs 1.3 0.7 - 4.0 K/uL   Monocytes Relative 9 %   Monocytes Absolute 0.9 0.1 - 1.0 K/uL   Eosinophils Relative 1 %   Eosinophils Absolute 0.1 0.0 - 0.5 K/uL   Basophils Relative 0 %   Basophils Absolute 0.0 0.0 - 0.1 K/uL   Immature Granulocytes 0 %   Abs Immature Granulocytes 0.03 0.00 - 0.07 K/uL  Comprehensive metabolic panel     Status: Abnormal   Collection Time: 08/14/22  2:31 AM  Result Value  Ref Range   Sodium 139 135 - 145 mmol/L   Potassium 3.4 (L) 3.5 - 5.1 mmol/L   Chloride 103 98 - 111 mmol/L   CO2 23 22 - 32 mmol/L   Glucose, Bld 122 (H) 70 - 99 mg/dL   BUN 25 (H) 8 - 23 mg/dL   Creatinine, Ser 1.06 (H) 0.44 - 1.00 mg/dL   Calcium 8.6 (L) 8.9 - 10.3 mg/dL   Total Protein 6.8 6.5 - 8.1 g/dL   Albumin 3.5 3.5 - 5.0 g/dL   AST 53 (H) 15 - 41 U/L   ALT 28 0 - 44 U/L   Alkaline Phosphatase 66 38 - 126 U/L   Total Bilirubin 0.5 0.3 - 1.2 mg/dL   GFR, Estimated 52 (L) >60 mL/min   Anion gap 13 5 - 15  Troponin I (High Sensitivity)     Status: None   Collection Time: 08/14/22  2:31 AM  Result Value Ref Range   Troponin I (High Sensitivity) 8 <18 ng/L  Troponin I (High Sensitivity)     Status: None   Collection Time: 08/14/22  4:28 AM  Result Value Ref Range   Troponin I (High Sensitivity) 5 <18 ng/L   *Note: Due to a large number of results and/or encounters for the requested time period, some results have not been displayed. A complete set of results can be found in Results Review.    Imaging: CT CHEST ABDOMEN PELVIS W CONTRAST  Result Date: 08/14/2022 CLINICAL DATA:  82 year old female status post fall. Right rib fractures and small pneumothorax on portable chest x-ray this morning. EXAM: CT CHEST, ABDOMEN, AND PELVIS WITH CONTRAST TECHNIQUE: Multidetector CT imaging of the chest, abdomen and pelvis was performed following the standard protocol during bolus administration of intravenous contrast. RADIATION DOSE REDUCTION: This exam was performed according to the departmental dose-optimization program which includes automated exposure control, adjustment of the mA and/or kV according to patient size and/or use of iterative reconstruction technique. CONTRAST:  62m OMNIPAQUE IOHEXOL 300 MG/ML  SOLN COMPARISON:  Portable chest 0255 hours. Cardiac CT 02/26/2022. CT Abdomen and Pelvis 02/15/2012. High-resolution chest CT 01/22/2022. FINDINGS: CT CHEST FINDINGS Cardiovascular:  Calcified aortic atherosclerosis. Tortuous thoracic aorta without dissection or discrete aneurysm. Cardiomegaly. No pericardial effusion. Central mediastinal vascular structures appear intact. Mediastinum/Nodes: No mediastinal hematoma or lymphadenopathy. Lungs/Pleura: Small volume layering complex fluid density right pleural effusion compatible with small hemothorax on series 2, image 34. Superimposed small right pneumothorax, most visible at the anterior costophrenic, cardiophrenic angle. Enhancing but confluent opacity throughout the posterior basal segment of the right lower lobe, with additional patchy pulmonary contusion in the lateral basal segment of that lobe on series 4, image 80. Major airways remain patent, lung volumes are low. Contralateral left lower lobe and lingula atelectasis. No left-side pneumothorax or pleural effusion. Musculoskeletal: Numerous chronic left rib fractures were present on the chest CT last year. Acute fractures of the right lateral 3rd, 5th, 6th, 7th, 8th and 10th ribs. Most are mildly displaced. Superimposed posterior right 5th rib fracture is minimally displaced. Visible shoulder osseous structures and the sternum appear intact. There are small avulsion fractures in the right thoracic vertebrae transverse processes of T7 (series 4, image 61), T8, T9. But on sagittal images thoracic vertebral height and alignment is maintained and stable from last year CT ABDOMEN PELVIS FINDINGS Hepatobiliary: Several small and circumscribed low-density areas in both hepatic lobes do not appear to be traumatic (right lobe series 2, image 54) and some were visible in 2013. These are largely unchanged on the delayed images and most likely benign hepatic cysts (no follow-up imaging recommended). Layering sludge in the gallbladder. No pericholecystic inflammation. No perihepatic fluid. Pancreas: Partially atrophy. Spleen:  Intact. No perisplenic fluid. Adrenals/Urinary Tract: Adrenal glands appears  stable and intact. Kidneys appears stable and intact with some renal parenchymal volume loss. Symmetric renal contrast excretion on the delayed images. Distended but otherwise unremarkable bladder. Occasional pelvic phleboliths. Stomach/Bowel: No dilated large or small bowel. Redundant large bowel. Sigmoid diverticulosis in the pelvis. Normal appendix in the right lower quadrant coronal image 39. Flocculated material in terminal ileum and other pelvic small bowel, but no upstream dilated loops. Decompressed stomach and duodenum. No free air or free fluid. Vascular/Lymphatic: Extensive Aortoiliac calcified atherosclerosis. Negative for abdominal aortic aneurysm. Major arterial structures remain patent. Portal venous system appears to be patent. No lymphadenopathy identified. Reproductive: Chronically absent uterus, diminutive or absent ovaries. Other: No pelvis free fluid. Musculoskeletal: Chronic L1 superior endplate compression fracture is stable from last year. Chronic severe lumbar disc and endplate degeneration superimposed. Chronic right hip arthroplasty. Lumbar spine, sacrum, SI joints, pelvis, and proximal femurs appear intact. IMPRESSION: 1. Small volume Right Hemo-pneumothorax. Suspect confluent right lower lobe pulmonary contusion in the posterior basal segment. Smaller contusion in the lateral basal segment. 2. Associated acute fractures of the right ribs 3 through 10. The 5th rib is fractured in two places. And there are associated small thoracic vertebral transverse process tip fractures at T7 through T9. 3. No other acute vertebral fracture identified. Chronic L1 compression fracture. 4. No other acute traumatic injury identified in the chest, abdomen, or pelvis. 5.  Aortic Atherosclerosis (ICD10-I70.0).  Cardiomegaly. Electronically Signed   By: HGenevie AnnM.D.   On: 08/14/2022 05:03   DG Chest Port 1 View  Result Date: 08/14/2022 CLINICAL DATA:  Fall with right ribcage pain. EXAM: PORTABLE CHEST 1  VIEW COMPARISON:  PA and lateral chest 05/08/2022. FINDINGS: There are slightly displaced acute  fractures of the lateral right fifth through eighth ribs and mild underlying pleural reaction. No other displaced rib fracture is seen. There are healed fracture deformities of the posterolateral left fourth through 7th ribs, noted previously. There is generalized osteopenia with no other acute skeletal findings. Also noted is an acute small right apical pneumothorax, estimated volume 5%, without mediastinal shift. There is increased opacity in right base which could be atelectasis, pneumonia or posttraumatic contusions. Remaining lungs are clear with COPD change and decreased pulmonary inspiration from the previous exam. There is mild aortic tortuosity and patchy calcific plaques with stable mediastinum. No vascular congestion is seen.  Mild cardiomegaly. IMPRESSION: 1. Mildly displaced acute fractures of the lateral right fifth through eighth ribs. 2. Small right apical pneumothorax estimated volume 5%. 3. Increased opacity in the right base which could be atelectasis, pneumonia or posttraumatic contusions. 4. COPD. 5. Aortic atherosclerosis.  Mild cardiomegaly. 6. These results will be called to the ordering clinician or representative by the Radiologist Assistant, and communication documented in the PACS or [employer]. Electronically Signed   By: Telford Nab M.D.   On: 08/14/2022 03:37    Assessment and Plan: This is a 82 y.o. female with a small right sided hemo-pneumothorax.  --Discussed with the patient the findings on her CT scans.  At this point, the amount of pneumothorax is small and may be able to hold off on placing a chest tube.  Discussed with her that we'd check a CXR later to see the progression.  If worse PTX, then would need a chest tube.  No surgery needed for the rib fractures at this point.  Would hold eliquis for now given the hemo component. --Otherwise, recommend pain control to  help with the rib fractures.  She has tylenol, oxycodone, morphine, and gabapentin ordered. --Will continue to follow with you.  I spent 60 minutes dedicated to the care of this patient on the date of this encounter to include pre-visit review of records, face-to-face time with the patient discussing diagnosis and management, and any post-visit coordination of care.   Melvyn Neth, MD [facility] Pg:  [phone]

## 2022-08-14 NOTE — ED Notes (Signed)
Informed RN bed assigned 

## 2022-08-14 NOTE — ED Notes (Signed)
Pt ready for transport, pt denies being soiled at this time.

## 2022-08-14 NOTE — ED Triage Notes (Signed)
Pt to XRAY

## 2022-08-14 NOTE — ED Notes (Signed)
This RN at bedside to hourly round and pt was found to be moaning and groaning in pain, pt given PRN morphine (10/10 pain), Dr. Hampton Abbot at bedside evaluating pt and this recommend additional pain meds. Pt denies any other needs at this time.

## 2022-08-14 NOTE — ED Notes (Signed)
This RN contacted pt's son by request to help with feeding her cat.

## 2022-08-14 NOTE — H&P (Addendum)
History and Physical    [name] DZH:[id number] DOB: 1940/08/19 DOA: 08/14/2022  Referring MD/NP/PA:   PCP: [name], MD   Patient coming from:  The patient is coming from home.     Chief Complaint: fall and right rib cage pain  HPI: [name] is a 82 y.o. female with medical history significant of A-fib on Eliquis, alcohol abuse, hypertension, CKD-3a, hyperlipidemia, diastolic CHF, gout, depression with anxiety, MGUS, psoriasis, left breast cancer, anemia, who presents with fall and right rib cage pain.  Patient states that she tripped her steps, accidentally fell last night.  She injured her right rib cage, causing pain and right rib pain, which is constant,, moderate to severe, nonradiating.  Patient denies shortness breath or coughing.  No fever or chills.  Denies nausea vomiting, diarrhea or abdominal pain.  No symptoms of UTI.  She took her Eliquis last night. Patient strongly denies loss of consciousness or any head or neck injury.  Patient refused CT scan of head and neck.  Data reviewed independently and ED Course: pt was found to have hemoglobin 10.7 (12.4 on 08/14/2022), stable renal function, temperature normal, blood pressure 107/73, heart rate of 65, RR 16, oxygen saturation 92-99% on room air.  Patient is admitted to PCU as inpatient.  Dr. Hampton Abbot of general surgery is consulted.   Initial CXR  1. Mildly displaced acute fractures of the lateral right fifth through eighth ribs. 2. Small right apical pneumothorax estimated volume 5%. 3. Increased opacity in the right base which could be atelectasis, pneumonia or posttraumatic contusions. 4. COPD. 5. Aortic atherosclerosis.  Mild cardiomegaly. 6. These results will be called to the ordering clinician or representative by the Radiologist Assistant, and communication documented in the PACS or [employer].  CT of chest/abd/pelvis: 1. Small volume Right Hemo-pneumothorax. Suspect confluent  right lower lobe pulmonary contusion in the posterior basal segment. Smaller contusion in the lateral basal segment.   2. Associated acute fractures of the right ribs 3 through 10. The 5th rib is fractured in two places. And there are associated small thoracic vertebral transverse process tip fractures at T7 through T9.   3. No other acute vertebral fracture identified. Chronic L1 compression fracture.   4. No other acute traumatic injury identified in the chest, abdomen, or pelvis.   5.  Aortic Atherosclerosis (ICD10-I70.0).  Cardiomegaly.  The repeated CXR: 1. Findings suggest CHF. 2. Numerous acute right-sided rib fractures and chronic left-sided rib fractures. The possibility of non-accidental trauma must be considered. 3. Tiny right apical pneumothorax. 4. Moderate left-sided pleural effusion.   EKG: I have personally reviewed.  A-fib, QTc 459, poor R wave progression   Review of Systems:   General: no fevers, chills, no body weight gain, has fatigue HEENT: no blurry vision, hearing changes or sore throat Respiratory: no dyspnea, coughing, wheezing CV: no chest pain, no palpitations GI: no nausea, vomiting, abdominal pain, diarrhea, constipation GU: no dysuria, burning on urination, increased urinary frequency, hematuria  Ext: no leg edema Neuro: no unilateral weakness, numbness, or tingling, no vision change or hearing loss. Has fall Skin: no rash, no skin tear. MSK: has right rib cage pain Heme: No easy bruising.  Travel history: No recent long distant travel.   Allergy: No Known Allergies  Past Medical History:  Diagnosis Date   (HFpEF) heart failure with preserved ejection fraction ([place])    a. 08/2017 Echo: EF 55-60%, no rwma, mild MR, mildly dil LA, nl RV fxn; b. 03/2020  Echo: EF 60-65%, no rwma, Gr2 DD. RVSP 44.76mHg. Mod dil LA. Mild MR; c. 09/2020 Echo: EF 50-55%, no rwma, mild LVH, mod red RV fxn, mildly dily RA.   Acute CHF (congestive heart failure)  ([place] 09/18/2020   Alcohol abuse 04/05/2020   [place]   left breast   [place] ([place]   left breast ca   Carotid arterial disease ([place]    a. 10/2004 s/p L CEA; b. 12/2015 Carotid U/S: RICA 1-39%; b. LICA patent CEA site.   Closed right hip fracture ([place] 09/20/2019   GERD (gastroesophageal reflux disease)    Hyperlipidemia    Hypertension    Osteoarthritis, multiple sites    Osteopenia    Persistent atrial fibrillation ([place]    a. Dx 08/2017; b. CHA2DS2VASc = 6-->Pradaxa; c. 09/2017 Successful DCCV (second shock - 200J); d. 10/2017 Recurrent Afib-->flecainide started 11/2017; e. 06/2020 s/p DCCV (200J x 1); f. 06/2020 Recurrent AF->Flec inc 75bid; g. 09/2020 WCT->flec d/c'd->amio started.   Personal history of radiation therapy 2001   left breast ca   Psoriasis    Wide-complex tachycardia    a. 09/2020 in setting of presumed Flecainide toxicity.  Flecainide d/c'd.    Past Surgical History:  Procedure Laterality Date   ABDOMINAL HYSTERECTOMY     ANKLE FRACTURE SURGERY  4/08   left---hardware still in place   ATRIAL FIBRILLATION ABLATION N/A 05/01/2022   Procedure: ATRIAL FIBRILLATION ABLATION;  Surgeon: LVickie Epley MD;  [facility];  Service: Cardiovascular;  Laterality: N/A;   BREAST BIOPSY Left 2001   breast ca   BREAST EXCISIONAL BIOPSY Left yrs ago   benign   BREAST LUMPECTOMY Left 2001   f/u radiation   CARDIOVERSION N/A 10/04/2017   Procedure: CARDIOVERSION;  Surgeon: [name] MD;  [facility];  Service: Cardiovascular;  Laterality: N/A;   CARDIOVERSION N/A 12/16/2017   Procedure: CARDIOVERSION;  Surgeon: [name] MD;  Location: [facility];  Service: Cardiovascular;  Laterality: N/A;   CARDIOVERSION N/A 06/24/2020   Procedure: CARDIOVERSION;  Surgeon: [name] MD;  Location: [facility];  Service: Cardiovascular;  Laterality: N/A;   CARDIOVERSION N/A 08/10/2022   Procedure: CARDIOVERSION;  Surgeon: [name] MD;  Location: [facility];  Service: Cardiovascular;  Laterality: N/A;   CAROTID ENDARTERECTOMY Left 10/23/2004   FRACTURE SURGERY     OOPHORECTOMY     SHOULDER SURGERY  6/07   left   TONSILLECTOMY AND ADENOIDECTOMY     TOTAL HIP ARTHROPLASTY  2004   right    Social History:  reports that she quit smoking about 27 years ago. Her smoking use included cigarettes. She has a 40.00 pack-year smoking history. She has never used smokeless tobacco. She reports current alcohol use of about 14.0 standard drinks of alcohol per week. She reports that she does not use drugs.  Family History:  Family History  Problem Relation Age of Onset   Hypertension Brother    Pneumonia Mother    Leukemia Father    Heart disease Neg Hx    Diabetes Neg Hx    Breast cancer Neg Hx      Prior to Admission medications   Medication Sig Start Date End Date Taking? Authorizing Provider  acetaminophen (TYLENOL) 325 MG tablet Take 2 tablets (650 mg total) by mouth every 4 (four) hours as needed for headache or mild pain. 09/19/20   Swayze, Ava, DO  allopurinol (ZYLOPRIM) 100 MG tablet TAKE  1.5 TABLETS (150 MG TOTAL) BY MOUTH DAILY 08/07/22   [name], MD  amiodarone (PACERONE) 200 MG tablet Take 1 tablet (200 mg total) by mouth 2 (two) times daily. 07/27/22   Fenton, Clint R, PA  amLODipine (NORVASC) 5 MG tablet Take 5 mg by mouth daily.    [provider]  apixaban (ELIQUIS) 2.5 MG TABS tablet TAKE 1 TABLET BY MOUTH TWICE A DAY 05/13/22   Vickie Epley, MD  cholecalciferol (VITAMIN D) 25 MCG (1000 UNIT) tablet Take 2 tablets (2,000 Units total) by mouth daily. 09/20/20   Swayze, Ava, DO  furosemide (LASIX) 20 MG tablet TAKE 1 TABLET BY MOUTH EVERY DAY 07/29/22   [name], MD  furosemide (LASIX) 40 MG tablet TAKE 0.5 TABLETS (20 MG TOTAL) BY MOUTH EVERY OTHER DAY. 07/29/22   [name], MD  LORazepam (ATIVAN) 0.5 MG tablet TAKE 1 TABLET BY MOUTH TWICE A DAY AS NEEDED FOR ANXIETY  06/09/22   [name], MD  mirtazapine (REMERON) 30 MG tablet Take 1 tablet (30 mg total) by mouth at bedtime. 06/09/22   [name], MD  Multiple Vitamin (MULTIVITAMIN) capsule Take 1 capsule by mouth daily.    [provider]  Multiple Vitamins-Minerals (PRESERVISION AREDS 2+MULTI VIT PO) Take 1 capsule by mouth in the morning and at bedtime.    [provider]    Physical Exam: Vitals:   08/14/22 1330 08/14/22 1405 08/14/22 1430 08/14/22 1433  BP: (!) 80/59 (!) 105/94 106/72   Pulse: (!) 50 (!) 57 61 (!) 58  Resp: 12 (!) '25 20 18  '$ Temp:    98.2 F (36.8 C)  TempSrc:    Axillary  SpO2: 98% 96% 95% 96%  Weight:       General: Not in acute distress HEENT:       Eyes: PERRL, EOMI, no scleral icterus.       ENT: No discharge from the ears and nose, no pharynx injection, no tonsillar enlargement.        Neck: No JVD, no bruit, no mass felt. Heme: No neck lymph node enlargement. Cardiac: S1/S2, RRR, No murmurs, No gallops or rubs. Respiratory: No rales, wheezing, rhonchi or rubs. GI: Soft, nondistended, nontender, no rebound pain, no organomegaly, BS present. GU: No hematuria Ext: No pitting leg edema bilaterally. 1+DP/PT pulse bilaterally. Musculoskeletal: has tenderness in right rib cage Skin: No rashes.  Neuro: Alert, oriented X3, cranial nerves II-XII grossly intact, moves all extremities normally. Psych: Patient is not psychotic, no suicidal or hemocidal ideation.  Labs on Admission: I have personally reviewed following labs and imaging studies  CBC: Recent Labs  Lab 08/14/22 0231 08/14/22 1148  WBC 9.1  --   NEUTROABS 6.8  --   HGB 10.7* 10.5*  HCT 32.4* 32.1*  MCV 105.9*  --   PLT 230  --    Basic Metabolic Panel: Recent Labs  Lab 08/14/22 0231  NA 139  K 3.4*  CL 103  CO2 23  GLUCOSE 122*  BUN 25*  CREATININE 1.06*  CALCIUM 8.6*   GFR: Estimated Creatinine Clearance: 33 mL/min (A) (by C-G formula based on SCr of 1.06  mg/dL (H)). Liver Function Tests: Recent Labs  Lab 08/14/22 0231  AST 53*  ALT 28  ALKPHOS 66  BILITOT 0.5  PROT 6.8  ALBUMIN 3.5   No results for input(s): "LIPASE", "AMYLASE" in the last 168 hours. No results for input(s): "AMMONIA" in the last 168 hours.  Coagulation Profile: Recent Labs  Lab 08/14/22 1148  INR 1.2   Cardiac Enzymes: No results for input(s): "CKTOTAL", "CKMB", "CKMBINDEX", "TROPONINI" in the last 168 hours. BNP (last 3 results) Recent Labs    02/27/22 1106  PROBNP 420.0*   HbA1C: No results for input(s): "HGBA1C" in the last 72 hours. CBG: No results for input(s): "GLUCAP" in the last 168 hours. Lipid Profile: No results for input(s): "CHOL", "HDL", "LDLCALC", "TRIG", "CHOLHDL", "LDLDIRECT" in the last 72 hours. Thyroid Function Tests: No results for input(s): "TSH", "T4TOTAL", "FREET4", "T3FREE", "THYROIDAB" in the last 72 hours. Anemia Panel: No results for input(s): "VITAMINB12", "FOLATE", "FERRITIN", "TIBC", "IRON", "RETICCTPCT" in the last 72 hours. Urine analysis: No results found for: "COLORURINE", "APPEARANCEUR", "LABSPEC", "PHURINE", "GLUCOSEU", "HGBUR", "BILIRUBINUR", "KETONESUR", "PROTEINUR", "UROBILINOGEN", "NITRITE", "LEUKOCYTESUR" Sepsis Labs: '@LABRCNTIP'$ (procalcitonin:4,lacticidven:4) )No results found for this or any previous visit (from the past 240 hour(s)).   Radiological Exams on Admission: DG Chest Port 1 View  Result Date: 08/14/2022 CLINICAL DATA:  Hemopneumothorax. EXAM: PORTABLE CHEST 1 VIEW COMPARISON:  Radiographs 08/14/2022 and 05/11/2022.  CT 08/14/2022. FINDINGS: Stable cardiomegaly and aortic atherosclerosis. Stable pleural effusions and bibasilar pulmonary opacities. Pleural line of known right apical pneumothorax is not well visualized, although this pneumothorax has not enlarged and is likely stable. Multiple acute right-sided rib fractures are noted. There are old left-sided rib fractures. No definite new findings are  seen. Telemetry leads overlie the chest. IMPRESSION: No significant change in the appearance of the chest. Stable cardiomegaly, pleural effusions and bibasilar pulmonary opacities. Probable small residual right apical pneumothorax, unchanged. Multiple acute right-sided rib fractures. Electronically Signed   By: Richardean Sale M.D.   On: 08/14/2022 14:12   X-ray chest PA and lateral  Addendum Date: 08/14/2022   ADDENDUM REPORT: 08/14/2022 09:55 ADDENDUM: Findings were discussed with Dr. Blaine Hamper. Electronically Signed   By: Sammie Bench M.D.   On: 08/14/2022 09:55   Result Date: 08/14/2022 CLINICAL DATA:  [id number] Hemopneumothorax on right [id number] EXAM: CHEST - 2 VIEW COMPARISON:  08/14/2022, 2:55 a.m. FINDINGS: Cardiac silhouette is prominent. There is pulmonary interstitial prominence with vascular congestion. No focal consolidation. There is a moderate left-sided pleural effusion and tiny pleural effusion on right. Aorta is calcified. Acute right lateral fifth through eighth rib fractures. Chronic left-sided rib fractures identified as better demonstrated on CT. Tiny right apical pneumothorax, smaller than on the study from earlier. IMPRESSION: 1. Findings suggest CHF. 2. Numerous acute right-sided rib fractures and chronic left-sided rib fractures. The possibility of non-accidental trauma must be considered. 3. Tiny right apical pneumothorax. 4. Moderate left-sided pleural effusion. Electronically Signed: By: Sammie Bench M.D. On: 08/14/2022 09:01   CT CHEST ABDOMEN PELVIS W CONTRAST  Result Date: 08/14/2022 CLINICAL DATA:  82 year old female status post fall. Right rib fractures and small pneumothorax on portable chest x-ray this morning. EXAM: CT CHEST, ABDOMEN, AND PELVIS WITH CONTRAST TECHNIQUE: Multidetector CT imaging of the chest, abdomen and pelvis was performed following the standard protocol during bolus administration of intravenous contrast. RADIATION DOSE REDUCTION: This exam was performed  according to the departmental dose-optimization program which includes automated exposure control, adjustment of the mA and/or kV according to patient size and/or use of iterative reconstruction technique. CONTRAST:  16m OMNIPAQUE IOHEXOL 300 MG/ML  SOLN COMPARISON:  Portable chest 0255 hours. Cardiac CT 02/26/2022. CT Abdomen and Pelvis 02/15/2012. High-resolution chest CT 01/22/2022. FINDINGS: CT CHEST FINDINGS Cardiovascular: Calcified aortic atherosclerosis. Tortuous thoracic aorta without dissection or discrete aneurysm. Cardiomegaly. No pericardial effusion. Central mediastinal  vascular structures appear intact. Mediastinum/Nodes: No mediastinal hematoma or lymphadenopathy. Lungs/Pleura: Small volume layering complex fluid density right pleural effusion compatible with small hemothorax on series 2, image 34. Superimposed small right pneumothorax, most visible at the anterior costophrenic, cardiophrenic angle. Enhancing but confluent opacity throughout the posterior basal segment of the right lower lobe, with additional patchy pulmonary contusion in the lateral basal segment of that lobe on series 4, image 80. Major airways remain patent, lung volumes are low. Contralateral left lower lobe and lingula atelectasis. No left-side pneumothorax or pleural effusion. Musculoskeletal: Numerous chronic left rib fractures were present on the chest CT last year. Acute fractures of the right lateral 3rd, 5th, 6th, 7th, 8th and 10th ribs. Most are mildly displaced. Superimposed posterior right 5th rib fracture is minimally displaced. Visible shoulder osseous structures and the sternum appear intact. There are small avulsion fractures in the right thoracic vertebrae transverse processes of T7 (series 4, image 61), T8, T9. But on sagittal images thoracic vertebral height and alignment is maintained and stable from last year CT ABDOMEN PELVIS FINDINGS Hepatobiliary: Several small and circumscribed low-density areas in both  hepatic lobes do not appear to be traumatic (right lobe series 2, image 54) and some were visible in 2013. These are largely unchanged on the delayed images and most likely benign hepatic cysts (no follow-up imaging recommended). Layering sludge in the gallbladder. No pericholecystic inflammation. No perihepatic fluid. Pancreas: Partially atrophy. Spleen:  Intact. No perisplenic fluid. Adrenals/Urinary Tract: Adrenal glands appears stable and intact. Kidneys appears stable and intact with some renal parenchymal volume loss. Symmetric renal contrast excretion on the delayed images. Distended but otherwise unremarkable bladder. Occasional pelvic phleboliths. Stomach/Bowel: No dilated large or small bowel. Redundant large bowel. Sigmoid diverticulosis in the pelvis. Normal appendix in the right lower quadrant coronal image 39. Flocculated material in terminal ileum and other pelvic small bowel, but no upstream dilated loops. Decompressed stomach and duodenum. No free air or free fluid. Vascular/Lymphatic: Extensive Aortoiliac calcified atherosclerosis. Negative for abdominal aortic aneurysm. Major arterial structures remain patent. Portal venous system appears to be patent. No lymphadenopathy identified. Reproductive: Chronically absent uterus, diminutive or absent ovaries. Other: No pelvis free fluid. Musculoskeletal: Chronic L1 superior endplate compression fracture is stable from last year. Chronic severe lumbar disc and endplate degeneration superimposed. Chronic right hip arthroplasty. Lumbar spine, sacrum, SI joints, pelvis, and proximal femurs appear intact. IMPRESSION: 1. Small volume Right Hemo-pneumothorax. Suspect confluent right lower lobe pulmonary contusion in the posterior basal segment. Smaller contusion in the lateral basal segment. 2. Associated acute fractures of the right ribs 3 through 10. The 5th rib is fractured in two places. And there are associated small thoracic vertebral transverse process  tip fractures at T7 through T9. 3. No other acute vertebral fracture identified. Chronic L1 compression fracture. 4. No other acute traumatic injury identified in the chest, abdomen, or pelvis. 5.  Aortic Atherosclerosis (ICD10-I70.0).  Cardiomegaly. Electronically Signed   By: Genevie Ann M.D.   On: 08/14/2022 05:03   DG Chest Port 1 View  Result Date: 08/14/2022 CLINICAL DATA:  Fall with right ribcage pain. EXAM: PORTABLE CHEST 1 VIEW COMPARISON:  PA and lateral chest 05/08/2022. FINDINGS: There are slightly displaced acute fractures of the lateral right fifth through eighth ribs and mild underlying pleural reaction. No other displaced rib fracture is seen. There are healed fracture deformities of the posterolateral left fourth through 7th ribs, noted previously. There is generalized osteopenia with no other acute skeletal findings. Also noted  is an acute small right apical pneumothorax, estimated volume 5%, without mediastinal shift. There is increased opacity in right base which could be atelectasis, pneumonia or posttraumatic contusions. Remaining lungs are clear with COPD change and decreased pulmonary inspiration from the previous exam. There is mild aortic tortuosity and patchy calcific plaques with stable mediastinum. No vascular congestion is seen.  Mild cardiomegaly. IMPRESSION: 1. Mildly displaced acute fractures of the lateral right fifth through eighth ribs. 2. Small right apical pneumothorax estimated volume 5%. 3. Increased opacity in the right base which could be atelectasis, pneumonia or posttraumatic contusions. 4. COPD. 5. Aortic atherosclerosis.  Mild cardiomegaly. 6. These results will be called to the ordering clinician or representative by the Radiologist Assistant, and communication documented in the PACS or [employer]. Electronically Signed   By: Telford Nab M.D.   On: 08/14/2022 03:37      Assessment/Plan Principal Problem:   Hemopneumothorax Active Problems:   Multiple  rib fractures   Macrocytic anemia   Fall   HTN (hypertension)   Chronic diastolic CHF (congestive heart failure) (HCC)   Atrial fibrillation, chronic (HCC)   Chronic kidney disease, stage 3a (HCC)   Hypokalemia   Gout   Depression with anxiety   Alcohol use   Assessment and Plan:  Hemopneumothorax and multiple rib fractures: CXR showed numerous acute right-sided rib fractures and chronic left-sided rib fracture. Since pt has alcohol abuse, this is likely due to fall, but we cannot completely r/o the possibility of normal traumatic rib fracture.  Need to be observed closely and follow-up with the PCP for any possibility of pathologic rib fracture. The repeated CXR still showed tiny right apical pneumothorax. Dr. Hampton Abbot of surgery is consulted.  -admit to PCU as inpt -repeat CXR in AM -monitoring Hgb level closely -Pain control: As needed morphine, Percocet, Tylenol -Lidoderm patch  Addendum: Patient developed hypotension with blood pressure 80/59(67) in afternoon -will get stat CXR --> no significant change. -will get stat HH -1L of NS bolus  Macrocytic anemia: Hemoglobin 12.4 on 08/14/2022 --> 10.7 --> 10.5 -f/u HH q6h -inr/ptt/type screen  Fall -PT/ot when pt is fully stabilized  HTN (hypertension) -Hold amlodipine and Lasix due to soft blood pressure  Chronic diastolic CHF (congestive heart failure) ([place]): 2D echo on 12/19/2021 showed EF of 55 to 60%.  Patient does not have leg edema, does not seem to have CHF exacerbation.  BNP 121 -Hold Lasix due to soft blood pressure  Atrial fibrillation, chronic ([place]): Heart rate 65 -Amiodarone -Hold Eliquis  Chronic kidney disease, stage 3a ([place]): Stable -Follow-up by BMP  Hypokalemia: Potassium 3.4 -Repleted potassium -Check magnesium level  Gout -Allopurinol  Depression with anxiety -Continue home medications  Alcohol use: No signs of withdrawal currently -CIWA protocol    DVT ppx: SCD  Code Status: Full  code  Family Communication: Yes, patient's son by phone  Disposition Plan:  Anticipate discharge back to previous environment  Consults called:   Dr. Hampton Abbot of general surgery is consulted.  Admission status and Level of care: Progressive:     as inpt      Dispo: The patient is from: Home              Anticipated d/c is to: Home              Anticipated d/c date is: 2 days              Patient currently is not medically stable to d/c.  Severity of Illness:  The appropriate patient status for this patient is INPATIENT. Inpatient status is judged to be reasonable and necessary in order to provide the required intensity of service to ensure the patient's safety. The patient's presenting symptoms, physical exam findings, and initial radiographic and laboratory data in the context of their chronic comorbidities is felt to place them at high risk for further clinical deterioration. Furthermore, it is not anticipated that the patient will be medically stable for discharge from the hospital within 2 midnights of admission.   * I certify that at the point of admission it is my clinical judgment that the patient will require inpatient hospital care spanning beyond 2 midnights from the point of admission due to high intensity of service, high risk for further deterioration and high frequency of surveillance required.*       Date of Service 08/14/2022    Ivor Costa [facility]   If 7PM-7AM, please contact night-coverage [email or web address] 08/14/2022, 3:00 PM

## 2022-08-14 NOTE — ED Notes (Signed)
Pt BP low at times, MD aware, new orders for NS bolus to be given, pt appears well at this time.

## 2022-08-14 NOTE — ED Provider Notes (Signed)
[facility] Provider Note    Event Date/Time   First MD Initiated Contact with Patient 08/14/22 [phone]     (approximate)   History   Fall, right rib pain   HPI  [name] is a 82 y.o. female brought to the ED via EMS from home with a chief complaint of right rib pain.  Patient reports she tripped and fell earlier in the evening.  Takes Eliquis.  Denies striking head or LOC.  Went to sleep and states pain and muscle spasms in her right rib cage woke her up out of her sleep.  EMS gave 50 mcg fentanyl and route which decreased patient's saturation and that she was placed on 2 L nasal cannula oxygen.  Denies fever/chills, shortness of breath, abdominal pain, nausea, vomiting or dizziness.     Past Medical History   Past Medical History:  Diagnosis Date   (HFpEF) heart failure with preserved ejection fraction ([place])    a. 08/2017 Echo: EF 55-60%, no rwma, mild MR, mildly dil LA, nl RV fxn; b. 03/2020 Echo: EF 60-65%, no rwma, Gr2 DD. RVSP 44.75mHg. Mod dil LA. Mild MR; c. 09/2020 Echo: EF 50-55%, no rwma, mild LVH, mod red RV fxn, mildly dily RA.   Acute CHF (congestive heart failure) ([facility] 09/18/2020   Alcohol abuse 04/05/2020   Breast cancer ([facility] 2001   left breast   Cancer ([facility] 2001   left breast ca   Carotid arterial disease ([facility]    a. 10/2004 s/p L CEA; b. 12/2015 Carotid U/S: RICA 1-39%; b. LICA patent CEA site.   Closed right hip fracture ([facility] 09/20/2019   GERD (gastroesophageal reflux disease)    Hyperlipidemia    Hypertension    Osteoarthritis, multiple sites    Osteopenia    Persistent atrial fibrillation ([facility]    a. Dx 08/2017; b. CHA2DS2VASc = 6-->Pradaxa; c. 09/2017 Successful DCCV (second shock - 200J); d. 10/2017 Recurrent Afib-->flecainide started 11/2017; e. 06/2020 s/p DCCV (200J x 1); f. 06/2020 Recurrent AF->Flec inc 75bid; g. 09/2020 WCT->flec d/c'd->amio started.   Personal history of radiation therapy 2001   left breast ca   Psoriasis     Wide-complex tachycardia    a. 09/2020 in setting of presumed Flecainide toxicity.  Flecainide d/c'd.     Active Problem List   Patient Active Problem List   Diagnosis Date Noted   Hemopneumothorax on right 08/14/2022   Secondary hypercoagulable state ([facility] 05/15/2022   Hammer toes of both feet 04/02/2022   Sinus bradycardia 01/15/2022   Orthostatic hypotension 01/15/2022   Multiple rib fractures 11/15/2021   Pneumothorax 11/15/2021   Chronic kidney disease, stage 3a ([facility] 11/05/2021   Boil of lower extremity-left ankle 11/05/2021   Alcohol use 11/05/2021   Hypomagnesemia 11/05/2021   Ankle wound, left, subsequent encounter 08/19/2021   Hyperparathyroidism ([facility] 02/17/2021   Slow heart beat 09/22/2020   CHF (congestive heart failure) ([facility] 09/19/2020   Troponin level elevated 09/18/2020   Itching 09/05/2020   Coagulation defect ([facility] 08/12/2020   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   Chronic neck pain 04/15/2020   Chronic kidney disease, stage 3, mod decreased GFR ([facility] 04/05/2020   DOE (dyspnea on exertion) 02/09/2020   Anxiety 01/23/2020   Gout 05/18/2019   Nodule of finger, bilateral 03/22/2019   [place] 03/09/2019   Chronic bilateral low back pain without sciatica 12/31/2017   Atrial fibrillation ([facility] 09/10/2017   Depression, major, single episode, mild ([facility] 09/10/2017  Chronic venous insufficiency 09/09/2017   Lymphedema 09/09/2017   Dry eyes 07/06/2017   Pain and swelling of left ankle 03/29/2017   Fall 05/01/2016   Leg weakness, bilateral 12/30/2015   Anemia of chronic disease 05/19/2013   Psoriatic arthritis ([place]) 02/03/2013   Psoriasis    Osteoarthritis, multiple sites    History of breast cancer 10/28/2011   GERD (gastroesophageal reflux disease)    HTN (hypertension)    Osteoporosis    Carotid stenosis 05/27/2011   Occlusion and stenosis of carotid artery without mention of cerebral infarction 05/27/2011     Past  Surgical History   Past Surgical History:  Procedure Laterality Date   ABDOMINAL HYSTERECTOMY     ANKLE FRACTURE SURGERY  4/08   left---hardware still in place   ATRIAL FIBRILLATION ABLATION N/A 05/01/2022   Procedure: [place];  Surgeon: Vickie Epley, MD;  Location: [facility];  Service: Cardiovascular;  Laterality: N/A;   BREAST BIOPSY Left 2001   breast ca   BREAST EXCISIONAL BIOPSY Left yrs ago   benign   BREAST LUMPECTOMY Left 2001   f/u radiation   CARDIOVERSION N/A 10/04/2017   Procedure: CARDIOVERSION;  Surgeon: Wellington Hampshire, MD;  Location: [facility];  Service: Cardiovascular;  Laterality: N/A;   CARDIOVERSION N/A 12/16/2017   Procedure: CARDIOVERSION;  Surgeon: [name], MD;  Location: [facility];  Service: Cardiovascular;  Laterality: N/A;   CARDIOVERSION N/A 06/24/2020   Procedure: CARDIOVERSION;  Surgeon: Wellington Hampshire, MD;  Location: [facility];  Service: Cardiovascular;  Laterality: N/A;   CARDIOVERSION N/A 08/10/2022   Procedure: CARDIOVERSION;  Surgeon: Wellington Hampshire, MD;  Location: [facility];  Service: Cardiovascular;  Laterality: N/A;   CAROTID ENDARTERECTOMY Left 10/23/2004   FRACTURE SURGERY     OOPHORECTOMY     SHOULDER SURGERY  6/07   left   TONSILLECTOMY AND ADENOIDECTOMY     TOTAL HIP ARTHROPLASTY  2004   right     Home Medications   Prior to Admission medications   Medication Sig Start Date End Date Taking? Authorizing Provider  acetaminophen (TYLENOL) 325 MG tablet Take 2 tablets (650 mg total) by mouth every 4 (four) hours as needed for headache or mild pain. 09/19/20   Swayze, Ava, DO  allopurinol (ZYLOPRIM) 100 MG tablet TAKE 1.5 TABLETS (150 MG TOTAL) BY MOUTH DAILY 08/07/22   [name], MD  amiodarone (PACERONE) 200 MG tablet Take 1 tablet (200 mg total) by mouth 2 (two) times daily. 07/27/22   Fenton, Clint R, PA  amLODipine (NORVASC) 5 MG tablet Take 5 mg by mouth daily.    [provider]   apixaban (ELIQUIS) 2.5 MG TABS tablet TAKE 1 TABLET BY MOUTH TWICE A DAY 05/13/22   Vickie Epley, MD  cholecalciferol (VITAMIN D) 25 MCG (1000 UNIT) tablet Take 2 tablets (2,000 Units total) by mouth daily. 09/20/20   Swayze, Ava, DO  furosemide (LASIX) 20 MG tablet TAKE 1 TABLET BY MOUTH EVERY DAY 07/29/22   [name], MD  furosemide (LASIX) 40 MG tablet TAKE 0.5 TABLETS (20 MG TOTAL) BY MOUTH EVERY OTHER DAY. 07/29/22   [name], MD  LORazepam (ATIVAN) 0.5 MG tablet TAKE 1 TABLET BY MOUTH TWICE A DAY AS NEEDED FOR ANXIETY 06/09/22   [name], MD  mirtazapine (REMERON) 30 MG tablet Take 1 tablet (30 mg total) by mouth at bedtime. 06/09/22   [name], MD  Multiple Vitamin (MULTIVITAMIN)  capsule Take 1 capsule by mouth daily.    [provider]  Multiple Vitamins-Minerals (PRESERVISION AREDS 2+MULTI VIT PO) Take 1 capsule by mouth in the morning and at bedtime.    [provider]     Allergies  Patient has no known allergies.   Family History   Family History  Problem Relation Age of Onset   Hypertension Brother    Pneumonia Mother    Leukemia Father    Heart disease Neg Hx    Diabetes Neg Hx    Breast cancer Neg Hx      Physical Exam  Triage Vital Signs: ED Triage Vitals  Enc Vitals Group     BP      Pulse      Resp      Temp      Temp src      SpO2      Weight      Height      Head Circumference      Peak Flow      Pain Score      Pain Loc      Pain Edu?      Excl. in [place]?     Updated Vital Signs: BP 108/68   Pulse 68   Temp 98.3 F (36.8 C) (Oral)   Resp 16   Wt 52 kg   SpO2 92%   BMI 17.96 kg/m    General: Awake, mild distress.  CV:  Regular rate, irregular rhythm.  Good peripheral perfusion.  Resp:  Normal effort.  CTAB.  Splinting.  No crepitus. Abd:  Nontender to light or deep palpation.  No distention.  Other:  No truncal vesicles.   ED Results / Procedures / Treatments  Labs (all labs  ordered are listed, but only abnormal results are displayed) Labs Reviewed  CBC WITH DIFFERENTIAL/PLATELET - Abnormal; Notable for the following components:      Result Value   RBC 3.06 (*)    Hemoglobin 10.7 (*)    HCT 32.4 (*)    MCV 105.9 (*)    MCH 35.0 (*)    All other components within normal limits  COMPREHENSIVE METABOLIC PANEL - Abnormal; Notable for the following components:   Potassium 3.4 (*)    Glucose, Bld 122 (*)    BUN 25 (*)    Creatinine, Ser 1.06 (*)    Calcium 8.6 (*)    AST 53 (*)    GFR, Estimated 52 (*)    All other components within normal limits  HEMOGLOBIN AND HEMATOCRIT, BLOOD  HEMOGLOBIN AND HEMATOCRIT, BLOOD  HEMOGLOBIN AND HEMATOCRIT, BLOOD  TROPONIN I (HIGH SENSITIVITY)  TROPONIN I (HIGH SENSITIVITY)     EKG  ED ECG REPORT I, Tessia Kassin J, the attending physician, personally viewed and interpreted this ECG.   Date: 08/14/2022  EKG Time: 0230  Rate: 69  Rhythm: atrial fibrillation, rate 69  Axis: Normal  Intervals:none  ST&T Change: Nonspecific    RADIOLOGY I have independently visualized and interpreted patient's x-rays as well as noted the radiology interpretation:  Chest x-ray: Right lateral rib fractures, small apical pneumothorax  CT chest/abdomen/pelvis: Small volume hemopneumothorax.  No intra-abdominal injuries.  Official radiology report(s): CT CHEST ABDOMEN PELVIS W CONTRAST  Result Date: 08/14/2022 CLINICAL DATA:  82 year old female status post fall. Right rib fractures and small pneumothorax on portable chest x-ray this morning. EXAM: CT CHEST, ABDOMEN, AND PELVIS WITH CONTRAST TECHNIQUE: Multidetector CT imaging of the chest, abdomen and pelvis was  performed following the standard protocol during bolus administration of intravenous contrast. RADIATION DOSE REDUCTION: This exam was performed according to the departmental dose-optimization program which includes automated exposure control, adjustment of the mA and/or kV  according to patient size and/or use of iterative reconstruction technique. CONTRAST:  1m OMNIPAQUE IOHEXOL 300 MG/ML  SOLN COMPARISON:  Portable chest 0255 hours. Cardiac CT 02/26/2022. CT Abdomen and Pelvis 02/15/2012. High-resolution chest CT 01/22/2022. FINDINGS: CT CHEST FINDINGS Cardiovascular: Calcified aortic atherosclerosis. Tortuous thoracic aorta without dissection or discrete aneurysm. Cardiomegaly. No pericardial effusion. Central mediastinal vascular structures appear intact. Mediastinum/Nodes: No mediastinal hematoma or lymphadenopathy. Lungs/Pleura: Small volume layering complex fluid density right pleural effusion compatible with small hemothorax on series 2, image 34. Superimposed small right pneumothorax, most visible at the anterior costophrenic, cardiophrenic angle. Enhancing but confluent opacity throughout the posterior basal segment of the right lower lobe, with additional patchy pulmonary contusion in the lateral basal segment of that lobe on series 4, image 80. Major airways remain patent, lung volumes are low. Contralateral left lower lobe and lingula atelectasis. No left-side pneumothorax or pleural effusion. Musculoskeletal: Numerous chronic left rib fractures were present on the chest CT last year. Acute fractures of the right lateral 3rd, 5th, 6th, 7th, 8th and 10th ribs. Most are mildly displaced. Superimposed posterior right 5th rib fracture is minimally displaced. Visible shoulder osseous structures and the sternum appear intact. There are small avulsion fractures in the right thoracic vertebrae transverse processes of T7 (series 4, image 61), T8, T9. But on sagittal images thoracic vertebral height and alignment is maintained and stable from last year CT ABDOMEN PELVIS FINDINGS Hepatobiliary: Several small and circumscribed low-density areas in both hepatic lobes do not appear to be traumatic (right lobe series 2, image 54) and some were visible in 2013. These are largely  unchanged on the delayed images and most likely benign hepatic cysts (no follow-up imaging recommended). Layering sludge in the gallbladder. No pericholecystic inflammation. No perihepatic fluid. Pancreas: Partially atrophy. Spleen:  Intact. No perisplenic fluid. Adrenals/Urinary Tract: Adrenal glands appears stable and intact. Kidneys appears stable and intact with some renal parenchymal volume loss. Symmetric renal contrast excretion on the delayed images. Distended but otherwise unremarkable bladder. Occasional pelvic phleboliths. Stomach/Bowel: No dilated large or small bowel. Redundant large bowel. Sigmoid diverticulosis in the pelvis. Normal appendix in the right lower quadrant coronal image 39. Flocculated material in terminal ileum and other pelvic small bowel, but no upstream dilated loops. Decompressed stomach and duodenum. No free air or free fluid. Vascular/Lymphatic: Extensive Aortoiliac calcified atherosclerosis. Negative for abdominal aortic aneurysm. Major arterial structures remain patent. Portal venous system appears to be patent. No lymphadenopathy identified. Reproductive: Chronically absent uterus, diminutive or absent ovaries. Other: No pelvis free fluid. Musculoskeletal: Chronic L1 superior endplate compression fracture is stable from last year. Chronic severe lumbar disc and endplate degeneration superimposed. Chronic right hip arthroplasty. Lumbar spine, sacrum, SI joints, pelvis, and proximal femurs appear intact. IMPRESSION: 1. Small volume Right Hemo-pneumothorax. Suspect confluent right lower lobe pulmonary contusion in the posterior basal segment. Smaller contusion in the lateral basal segment. 2. Associated acute fractures of the right ribs 3 through 10. The 5th rib is fractured in two places. And there are associated small thoracic vertebral transverse process tip fractures at T7 through T9. 3. No other acute vertebral fracture identified. Chronic L1 compression fracture. 4. No other  acute traumatic injury identified in the chest, abdomen, or pelvis. 5.  Aortic Atherosclerosis (ICD10-I70.0).  Cardiomegaly. Electronically Signed  By: Genevie Ann M.D.   On: 08/14/2022 05:03   DG Chest Port 1 View  Result Date: 08/14/2022 CLINICAL DATA:  Fall with right ribcage pain. EXAM: PORTABLE CHEST 1 VIEW COMPARISON:  PA and lateral chest 05/08/2022. FINDINGS: There are slightly displaced acute fractures of the lateral right fifth through eighth ribs and mild underlying pleural reaction. No other displaced rib fracture is seen. There are healed fracture deformities of the posterolateral left fourth through 7th ribs, noted previously. There is generalized osteopenia with no other acute skeletal findings. Also noted is an acute small right apical pneumothorax, estimated volume 5%, without mediastinal shift. There is increased opacity in right base which could be atelectasis, pneumonia or posttraumatic contusions. Remaining lungs are clear with COPD change and decreased pulmonary inspiration from the previous exam. There is mild aortic tortuosity and patchy calcific plaques with stable mediastinum. No vascular congestion is seen.  Mild cardiomegaly. IMPRESSION: 1. Mildly displaced acute fractures of the lateral right fifth through eighth ribs. 2. Small right apical pneumothorax estimated volume 5%. 3. Increased opacity in the right base which could be atelectasis, pneumonia or posttraumatic contusions. 4. COPD. 5. Aortic atherosclerosis.  Mild cardiomegaly. 6. These results will be called to the ordering clinician or representative by the Radiologist Assistant, and communication documented in the PACS or [employer]. Electronically Signed   By: Telford Nab M.D.   On: 08/14/2022 03:37     PROCEDURES:  Critical Care performed: Yes, see critical care procedure note(s)  CRITICAL CARE Performed by: Paulette Blanch   Total critical care time: 45 minutes  Critical care time was exclusive of separately  billable procedures and treating other patients.  Critical care was necessary to treat or prevent imminent or life-threatening deterioration.  Critical care was time spent personally by me on the following activities: development of treatment plan with patient and/or surrogate as well as nursing, discussions with consultants, evaluation of patient's response to treatment, examination of patient, obtaining history from patient or surrogate, ordering and performing treatments and interventions, ordering and review of laboratory studies, ordering and review of radiographic studies, pulse oximetry and re-evaluation of patient's condition.   Marland Kitchen1-3 Lead EKG Interpretation  Performed by: Paulette Blanch, MD Authorized by: Paulette Blanch, MD     Interpretation: normal     ECG rate:  70   ECG rate assessment: normal     Rhythm: atrial fibrillation     Ectopy: none     Conduction: normal   Comments:     Patient placed on cardiac monitor to evaluate for arrhythmias    MEDICATIONS ORDERED IN ED: Medications  acetaminophen (TYLENOL) tablet 650 mg (has no administration in time range)    Or  acetaminophen (TYLENOL) suppository 650 mg (has no administration in time range)  traZODone (DESYREL) tablet 25 mg (has no administration in time range)  magnesium hydroxide (MILK OF MAGNESIA) suspension 30 mL (has no administration in time range)  ondansetron (ZOFRAN) tablet 4 mg (has no administration in time range)    Or  ondansetron (ZOFRAN) injection 4 mg (has no administration in time range)  morphine (PF) 2 MG/ML injection 2 mg (has no administration in time range)  0.9 % NaCl with KCl 20 mEq/ L  infusion (has no administration in time range)  allopurinol (ZYLOPRIM) tablet 150 mg (has no administration in time range)  amiodarone (PACERONE) tablet 200 mg (has no administration in time range)  amLODipine (NORVASC) tablet 5 mg (has no administration in time range)  furosemide (LASIX) tablet 20 mg (has no  administration in time range)  LORazepam (ATIVAN) tablet 0.5 mg (has no administration in time range)  mirtazapine (REMERON) tablet 30 mg (has no administration in time range)  cholecalciferol (VITAMIN D3) 25 MCG (1000 UNIT) tablet 2,000 Units (has no administration in time range)  multivitamin with minerals tablet 1 tablet (has no administration in time range)  diazepam (VALIUM) injection 0.5 mg (0.5 mg Intravenous Given 08/14/22 0240)  iohexol (OMNIPAQUE) 300 MG/ML solution 75 mL (75 mLs Intravenous Contrast Given 08/14/22 0411)  morphine (PF) 2 MG/ML injection 2 mg (2 mg Intravenous Given 08/14/22 0407)  ondansetron (ZOFRAN) injection 4 mg (4 mg Intravenous Given 08/14/22 0407)  morphine (PF) 2 MG/ML injection 2 mg (2 mg Intravenous Given 08/14/22 0603)     IMPRESSION / MDM / ASSESSMENT AND PLAN / ED COURSE  I reviewed the triage vital signs and the nursing notes.                             82 year old female presenting with right rib pain status post mechanical fall earlier in the evening.  Differential diagnosis includes but is not limited to rib fracture, pneumothorax, hemothorax, liver laceration, musculoskeletal contusion, etc.  I have personally reviewed patient's records and note that she had cardioversion on 08/10/2022.  Patient's presentation is most consistent with acute presentation with potential threat to life or bodily function.  The patient is on the cardiac monitor to evaluate for evidence of arrhythmia and/or significant heart rate changes.  Will obtain cardiac panel, rib series.  Administer low-dose IV diazepam for muscle spasms.  Will reassess.  Clinical Course as of 08/14/22 [id number]  Ludwig Clarks Aug 14, 2022  0255 Patient in too much pain for rib series; will obtain portable chest.  Consider CT pending lab work. [JS]  [id number] Discussed with radiology regarding chest x-ray demonstrating multiple lateral rib fractures and less than 5% apical pneumothorax.  Will obtain CT scan for  further characterization.  Updated patient who is still complaining of spasms; will try low-dose morphine. [JS]  [phone] Spoke with surgery on-call Dr. Hampton Abbot who reviewed patient's CT scan images.  Recommends holding Eliquis, do not need to reverse at this time; serial H/H, serial chest x-rays.  Given small volume hemopneumothorax, may hold chest tube at this time.  Updated patient and will consult hospital services for evaluation and admission.  Patient was placed on 2 L nasal cannula oxygen after she was given Valium and morphine. [JS]    Clinical Course User Index [JS] Paulette Blanch, MD     FINAL CLINICAL IMPRESSION(S) / ED DIAGNOSES   Final diagnoses:  Fall, initial encounter  Blunt trauma to chest, initial encounter  Closed fracture of multiple ribs of right side, initial encounter  Hemothorax on right  Traumatic pneumothorax, initial encounter  Intractable pain     Rx / DC Orders   ED Discharge Orders     None        Note:  This document was prepared using Dragon voice recognition software and may include unintentional dictation errors.   Paulette Blanch, MD 08/14/22 [phone]

## 2022-08-15 ENCOUNTER — Inpatient Hospital Stay: Payer: Medicare Other

## 2022-08-15 DIAGNOSIS — R636 Underweight: Secondary | ICD-10-CM | POA: Diagnosis present

## 2022-08-15 DIAGNOSIS — J942 Hemothorax: Secondary | ICD-10-CM | POA: Diagnosis not present

## 2022-08-15 DIAGNOSIS — L8990 Pressure ulcer of unspecified site, unspecified stage: Secondary | ICD-10-CM | POA: Diagnosis present

## 2022-08-15 LAB — BASIC METABOLIC PANEL
Anion gap: 8 (ref 5–15)
BUN: 27 mg/dL — ABNORMAL HIGH (ref 8–23)
CO2: 26 mmol/L (ref 22–32)
Calcium: 9 mg/dL (ref 8.9–10.3)
Chloride: 103 mmol/L (ref 98–111)
Creatinine, Ser: 1.25 mg/dL — ABNORMAL HIGH (ref 0.44–1.00)
GFR, Estimated: 43 mL/min — ABNORMAL LOW (ref >60)
Glucose, Bld: 99 mg/dL (ref 70–99)
Potassium: 4.5 mmol/L (ref 3.5–5.1)
Sodium: 137 mmol/L (ref 135–145)

## 2022-08-15 LAB — CBC
HCT: 32.5 % — ABNORMAL LOW (ref 36.0–46.0)
Hemoglobin: 10.4 g/dL — ABNORMAL LOW (ref 12.0–15.0)
MCH: 34.4 pg — ABNORMAL HIGH (ref 26.0–34.0)
MCHC: 32 g/dL (ref 30.0–36.0)
MCV: 107.6 fL — ABNORMAL HIGH (ref 80.0–100.0)
Platelets: 208 10*3/uL (ref 150–400)
RBC: 3.02 MIL/uL — ABNORMAL LOW (ref 3.87–5.11)
RDW: 14.6 % (ref 11.5–15.5)
WBC: 8.6 10*3/uL (ref 4.0–10.5)
nRBC: 0 % (ref 0.0–0.2)

## 2022-08-15 LAB — TYPE AND SCREEN
ABO/RH(D): O POS
Antibody Screen: NEGATIVE

## 2022-08-15 LAB — MAGNESIUM: Magnesium: 1.8 mg/dL (ref 1.7–2.4)

## 2022-08-15 MED ORDER — SODIUM CHLORIDE 0.9 % IV SOLN: INTRAVENOUS | Status: DC

## 2022-08-15 NOTE — Assessment & Plan Note (Signed)
- 

## 2022-08-15 NOTE — Assessment & Plan Note (Signed)
With multiple R rib fractures and hemopneumothorax.  Pt declined CT head or neck on admission and denied striking her head or LOC. --PT/OT evaluations --SNF recommended --TOC following for placement --Fall precautions

## 2022-08-15 NOTE — Hospital Course (Signed)
[name] is a 82 y.o. female with medical history significant of A-fib on Eliquis, alcohol abuse, hypertension, CKD-3a, hyperlipidemia, diastolic CHF, gout, depression with anxiety, MGUS, psoriasis, left breast cancer, anemia, who presented on 08/14/2022 for evaluation after a mechanical fall and subsequent severe right rib cage pain.  She denied hitting head or LOC, reportedly declined CT's of head and neck.   ED Course: pt was found to have hemoglobin 10.7 (12.4 on 08/14/2022), stable renal function, afebrile and stable vitals with BP low/normal.    Imaging revealed right-sided lateral rib fractures of ribs 5-8, a small right apical pneumothorax (5% volume), right base opacity (atelectasis vs PNA), COPD chronic changes.  Also has chronic left-sided rib fractures.  Moderate left-side pleural effusion.  CT chest/abdomen/pelvis showed small volume right hemo-pneumothorax and pulmonary contusions. Rib fractures noted 3-10, with 5th rib fractured in two places.  Also small T7-T9 transverse process fractures.    Patient was admitted to medicine service with surgery consulted.   No chest tube has been required thus far, and no indication for surgical intervention.    1/27: repeat CXR, stable R apical PTX of ~5%, increased right pleural fluid. 1/29: no residual PTX on CXR today.  Stable but pain remains uncontrolled.   1/30: increased O2 needs overnight.  Started antibiotic, given a dose IV Lasix.  Suspect some fluid overload and ?developing pneumonia. 1/31: ongoing work of breathing, tachypnea, on NRB mask borderline/low sats with minimal exertion. Further diuresis.

## 2022-08-15 NOTE — Assessment & Plan Note (Signed)
Softer BP's.   Hold home Lasix, amlodipine Getting IV Lasix

## 2022-08-15 NOTE — Assessment & Plan Note (Signed)
Continue allopurinol 

## 2022-08-15 NOTE — Assessment & Plan Note (Signed)
K replaced on admission. Monitor & replace PRN

## 2022-08-15 NOTE — Progress Notes (Signed)
Progress Note   Patient: [name] IEP:[id number] DOB: [birth year] DOA: 08/14/2022     1 DOS: the patient was seen and examined on 08/15/2022   Brief hospital course: [name] is a 82 y.o. female with medical history significant of A-fib on Eliquis, alcohol abuse, hypertension, CKD-3a, hyperlipidemia, diastolic CHF, gout, depression with anxiety, MGUS, psoriasis, left breast cancer, anemia, who presented on 08/14/2022 for evaluation after a mechanical fall and subsequent severe right rib cage pain.  She denied hitting head or LOC, reportedly declined CT's of head and neck.   ED Course: pt was found to have hemoglobin 10.7 (12.4 on 08/14/2022), stable renal function, afebrile and stable vitals with BP low/normal.    Imaging revealed right-sided lateral rib fractures of ribs 5-8, a small right apical pneumothorax (5% volume), right base opacity (atelectasis vs PNA), COPD chronic changes.  Also has chronic left-sided rib fractures.  Moderate left-side pleural effusion.  CT chest/abdomen/pelvis showed small volume right hemo-pneumothorax and pulmonary contusions. Rib fractures noted 3-10, with 5th rib fractured in two places.  Also small T7-T9 transverse process fractures.    Patient was admitted to medicine service with surgery consulted.   No chest tube has been required thus far, and no indication for surgical intervention.    1/27: repeat CXR, stable R apical PTX of ~5%, increased right pleural fluid.  Assessment and Plan: * Hemopneumothorax Surgery following. Due to multiple rib fractures. Hold Eliquis Monitor Hbg Monitor respiratory status closely Serial CXR's  Multiple rib fractures See imaging reports.  On CT scan, right ribs 3 through 10 are fractured, with 5th rib two fractures.  Also T7-T9 transverse process fractures. --Pain control and muscle relaxers --Incentive spirometry --PT/OT --Lidocaine patch --Serial CXR's  Fall With multiple R rib fractures and  hemopneumothorax.  Pt declined CT head or neck on admission and denied striking her head or LOC. --PT/OT evaluations --Fall precautions  Hypokalemia K replaced on admission. Monitor & replace PRN  Macrocytic anemia Hbg 12.4 on 08/14/22, on admission, Hbg was 10.7 >> 10.5. Monitor Hbg closely with hemopneumothorax. Hold Eliquis for now.   Atrial fibrillation, chronic (HCC) HR 50's to 60's. Continue amiodarone Hold Eliquis due to hemopneumothorax  Chronic diastolic CHF (congestive heart failure) (HCC) Appears euvolemic to dry. Last echo June 2023 EF 55-60%. Hold Lasix with soft BP's Monitor volume status  HTN (hypertension) Softer BP's.   Hold home Lasix, amlodipine  Chronic kidney disease, stage 3a ([place]) Renal function near baseline. Monitor BMP.  Depression with anxiety Continue home regimen  Gout Continue allopurinol  Underweight Body mass index is 17.96 kg/m.   Pressure injury of skin Pressure Injury 08/14/22 Vertebral column Lower;Medial Stage 1 -  Intact skin with non-blanchable redness of a localized area usually over a bony prominence. (Active)  08/14/22 1552  Location: Vertebral column  Location Orientation: Lower;Medial  Staging: Stage 1 -  Intact skin with non-blanchable redness of a localized area usually over a bony prominence.  Wound Description (Comments):   Present on Admission: Yes      Alcohol use Monitor on CIWA first 48 hours of admission, then d/c if no signs of withdrawal.        Subjective: Pt awake sitting up in bed this AM. She is having severe pain, getting medications from RN.  She begins having gripping pain from muscle spasms of her right chest wall.  No other acute complaints at this time.   Physical Exam: Vitals:   08/15/22 0015 08/15/22 6606 08/15/22 3016  08/15/22 1155  BP: (!) 121/53 (!) 102/51 (!) 94/56 (!) 111/58  Pulse: (!) 53 (!) 54 (!) 59 61  Resp: '19 18 20 20  '$ Temp: 97.7 F (36.5 C) 98.5 F (36.9 C)  97.8  F (36.6 C)  TempSrc: Oral Oral    SpO2: 95% 98% 95% 91%  Weight:       General exam: awake, alert, appears in pain, wincing at times, frail, underweight HEENT: atraumatic, clear conjunctiva, anicteric sclera, moist mucus membranes, hearing grossly normal  Respiratory system: diminished breath sounds, no wheezes, no rhonchi, normal respiratory effort at rest. Cardiovascular system: normal S1/S2, RRR, no JVD, murmurs, rubs, gallops, no pedal edema.   Gastrointestinal system: soft, NT, ND, no HSM felt, +bowel sounds. Central nervous system: A&O x4. no gross focal neurologic deficits, normal speech Extremities: moves all , no edema, normal tone Skin: dry, intact, normal temperature, normal color, No rashes, lesions or ulcers Psychiatry: normal mood, congruent affect, judgement and insight appear normal   Data Reviewed:  Notable labs --- Cr 1.25 from 1.06, BUN 27, Hbg 10.4 from 10.6  Family Communication: None present. Will attempt to call  Disposition: Status is: Inpatient Remains inpatient appropriate because: Severity of illness with hemopneumothorax requiring very close monitoring.  Pain uncontrolled.    Planned Discharge Destination:  TBD, Home health vs SNF likely    Time spent: 40 minutes  Author: Ezekiel Slocumb, DO 08/15/2022 2:15 PM  For on call review [email or web address].

## 2022-08-15 NOTE — Assessment & Plan Note (Signed)
No withdrawal signs.  D/C CIWA orders.

## 2022-08-15 NOTE — Assessment & Plan Note (Signed)
HR 50's to 60's. Continue amiodarone Hold Eliquis due to hemopneumothorax

## 2022-08-15 NOTE — Assessment & Plan Note (Addendum)
Surgery following. Due to multiple rib fractures. Hold Eliquis Monitor Hbg Monitor respiratory status closely Serial CXR's

## 2022-08-15 NOTE — Assessment & Plan Note (Signed)
Pressure Injury 08/14/22 Vertebral column Lower;Medial Stage 1 -  Intact skin with non-blanchable redness of a localized area usually over a bony prominence. (Active)  08/14/22 1552  Location: Vertebral column  Location Orientation: Lower;Medial  Staging: Stage 1 -  Intact skin with non-blanchable redness of a localized area usually over a bony prominence.  Wound Description (Comments):   Present on Admission: Yes

## 2022-08-15 NOTE — Progress Notes (Signed)
Subjective:  CC: [name] is a 82 y.o. female  Hospital stay day 1,   small right sided hemo-pneumothorax.   HPI: No acute issues reported overnight.  Patient states pain is controlled.,  But feeling tired  ROS:  General: Denies weight loss, weight gain, fatigue, fevers, chills, and night sweats. Heart: Denies chest pain, palpitations, racing heart, irregular heartbeat, leg pain or swelling, and decreased activity tolerance. Respiratory: Denies breathing difficulty, shortness of breath, wheezing, cough, and sputum. GI: Denies change in appetite, heartburn, nausea, vomiting, constipation, diarrhea, and blood in stool. GU: Denies difficulty urinating, pain with urinating, urgency, frequency, blood in urine.   Objective:   Temp:  [97.7 F (36.5 C)-98.6 F (37 C)] 97.8 F (36.6 C) (01/27 1155) Pulse Rate:  [50-61] 61 (01/27 1155) Resp:  [12-26] 20 (01/27 1155) BP: (80-121)/(51-94) 111/58 (01/27 1155) SpO2:  [89 %-98 %] 91 % (01/27 1155)       Weight: 52 kg BMI (Calculated): 17.95   Intake/Output this shift:   Intake/Output Summary (Last 24 hours) at 08/15/2022 1328 Last data filed at 08/15/2022 1000 Gross per 24 hour  Intake 1080 ml  Output 200 ml  Net 880 ml    Constitutional :  alert, cooperative, appears stated age, and no distress  Respiratory:  clear to auscultation bilaterally  Cardiovascular:  regular rate and rhythm  Gastrointestinal: soft, non-tender; bowel sounds normal; no masses,  no organomegaly.   Skin: Cool and moist.  Tenderness to palpation around right chest wall as expected  Psychiatric: Normal affect, non-agitated, not confused       LABS:     Latest Ref Rng & Units 08/15/2022    1:37 AM 08/14/2022    2:31 AM 07/29/2022    9:14 AM  CMP  Glucose 70 - 99 mg/dL 99  122  120   BUN 8 - 23 mg/dL 27  25  33   Creatinine 0.44 - 1.00 mg/dL 1.25  1.06  1.10   Sodium 135 - 145 mmol/L 137  139  140   Potassium 3.5 - 5.1 mmol/L 4.5  3.4  3.7   Chloride  98 - 111 mmol/L 103  103  100   CO2 22 - 32 mmol/L '26  23  27   '$ Calcium 8.9 - 10.3 mg/dL 9.0  8.6  9.4   Total Protein 6.5 - 8.1 g/dL  6.8  7.6   Total Bilirubin 0.3 - 1.2 mg/dL  0.5  0.8   Alkaline Phos 38 - 126 U/L  66  77   AST 15 - 41 U/L  53  25   ALT 0 - 44 U/L  28  17       Latest Ref Rng & Units 08/15/2022    1:37 AM 08/14/2022    7:59 PM 08/14/2022    4:14 PM  CBC  WBC 4.0 - 10.5 K/uL 8.6     Hemoglobin 12.0 - 15.0 g/dL 10.4  10.6  10.8   Hematocrit 36.0 - 46.0 % 32.5  32.3  33.3   Platelets 150 - 400 K/uL 208       RADS: CLINICAL DATA:  Right pneumothorax with multilevel right ribcage fractures. [id number], [id number].   EXAM: CHEST - 2 VIEW   COMPARISON:  Portable chest yesterday at 1:50 p.m., chest CT with contrast yesterday at 4:12 a.m.   FINDINGS: Small right apical pneumothorax is not significantly changed, estimated volume 5%. There is increasing right pleural fluid today now covering the hemidiaphragm, small  unchanged left pleural fluid. Patchy airspace disease in the bases appear similar.   No new lung opacity is seen. There are multilevel recent right ribcage fractures most laterally. Osteopenia.   IMPRESSION: 1. No significant change in small right apical pneumothorax, estimated volume 5%. 2. Increased right pleural fluid, now covers which the dome of the right diaphragm. 3. No change in the small left pleural fluid and bibasilar opacities. 4. Multilevel recent right ribcage fractures.     Electronically Signed   By: Telford Nab M.D.   On: 08/15/2022 07:30   Assessment:   Right side hemopneumothorax.  Stable.  Recommend continuing pain management for another day to see if any continued resolution.  Otherwise continue his current management.  Incentive spirometry ordered as well.  labs/images/medications/previous chart entries reviewed personally and relevant changes/updates noted above.

## 2022-08-15 NOTE — Assessment & Plan Note (Addendum)
See imaging reports.  On CT scan, right ribs 3 through 10 are fractured, with 5th rib two fractures.  Also T7-T9 transverse process fractures. --Pain control - scheduled Tylenol and PRN oxycodone, PRN morphine for breakthrough --Continue scheduled Robaxin --Incentive spirometry --PT/OT --Lidocaine patch --Serial CXR's

## 2022-08-15 NOTE — Assessment & Plan Note (Signed)
Renal function near baseline. Monitor BMP.

## 2022-08-15 NOTE — Assessment & Plan Note (Signed)
Hbg 12.4 on 08/14/22, on admission, Hbg was 10.7 >> 10.5. Monitor Hbg closely with hemopneumothorax. Hold Eliquis for now.

## 2022-08-15 NOTE — Assessment & Plan Note (Signed)
Body mass index is 17.96 kg/m.

## 2022-08-15 NOTE — Assessment & Plan Note (Signed)
Last echo June 2023 EF 55-60%. Home Lasix held on admission with soft BP's and AKI requiring fluids. Now cautiously diuresing due to signs of volume overload, increased respiratory distress. 1/30 - IV Lasix 20 mg x 1 1/31 - Further IV Lasix today  --Strict I/O's & daily weights --Monitor renal function, electrolytes

## 2022-08-16 ENCOUNTER — Inpatient Hospital Stay: Payer: Medicare Other

## 2022-08-16 DIAGNOSIS — S2242XK Multiple fractures of ribs, left side, subsequent encounter for fracture with nonunion: Secondary | ICD-10-CM

## 2022-08-16 DIAGNOSIS — J942 Hemothorax: Secondary | ICD-10-CM | POA: Diagnosis not present

## 2022-08-16 DIAGNOSIS — I482 Chronic atrial fibrillation, unspecified: Secondary | ICD-10-CM | POA: Diagnosis not present

## 2022-08-16 DIAGNOSIS — N179 Acute kidney failure, unspecified: Secondary | ICD-10-CM | POA: Diagnosis not present

## 2022-08-16 LAB — BASIC METABOLIC PANEL
Anion gap: 4 — ABNORMAL LOW (ref 5–15)
BUN: 40 mg/dL — ABNORMAL HIGH (ref 8–23)
CO2: 24 mmol/L (ref 22–32)
Calcium: 8.6 mg/dL — ABNORMAL LOW (ref 8.9–10.3)
Chloride: 107 mmol/L (ref 98–111)
Creatinine, Ser: 1.51 mg/dL — ABNORMAL HIGH (ref 0.44–1.00)
GFR, Estimated: 34 mL/min — ABNORMAL LOW (ref >60)
Glucose, Bld: 98 mg/dL (ref 70–99)
Potassium: 4.1 mmol/L (ref 3.5–5.1)
Sodium: 135 mmol/L (ref 135–145)

## 2022-08-16 LAB — CBC
HCT: 30.6 % — ABNORMAL LOW (ref 36.0–46.0)
Hemoglobin: 9.7 g/dL — ABNORMAL LOW (ref 12.0–15.0)
MCH: 34.4 pg — ABNORMAL HIGH (ref 26.0–34.0)
MCHC: 31.7 g/dL (ref 30.0–36.0)
MCV: 108.5 fL — ABNORMAL HIGH (ref 80.0–100.0)
Platelets: 185 10*3/uL (ref 150–400)
RBC: 2.82 MIL/uL — ABNORMAL LOW (ref 3.87–5.11)
RDW: 14.2 % (ref 11.5–15.5)
WBC: 8.2 10*3/uL (ref 4.0–10.5)
nRBC: 0 % (ref 0.0–0.2)

## 2022-08-16 LAB — HEMOGLOBIN AND HEMATOCRIT, BLOOD
HCT: 31.5 % — ABNORMAL LOW (ref 36.0–46.0)
Hemoglobin: 9.9 g/dL — ABNORMAL LOW (ref 12.0–15.0)

## 2022-08-16 MED ORDER — METHOCARBAMOL 500 MG PO TABS
750.0000 mg | ORAL_TABLET | Freq: Three times a day (TID) | ORAL | Status: DC
  Administered 2022-08-16 – 2022-08-26 (×29): 750 mg via ORAL
  Filled 2022-08-16 (×30): qty 2

## 2022-08-16 NOTE — Progress Notes (Signed)
Progress Note   Patient: [name] DOB: 24-Jan-1941 DOA: 08/14/2022     2 DOS: the patient was seen and examined on 08/16/2022   Brief hospital course: [name] is a 82 y.o. female with medical history significant of A-fib on Eliquis, alcohol abuse, hypertension, CKD-3a, hyperlipidemia, diastolic CHF, gout, depression with anxiety, MGUS, psoriasis, left breast cancer, anemia, who presented on 08/14/2022 for evaluation after a mechanical fall and subsequent severe right rib cage pain.  She denied hitting head or LOC, reportedly declined CT's of head and neck.   ED Course: pt was found to have hemoglobin 10.7 (12.4 on 08/14/2022), stable renal function, afebrile and stable vitals with BP low/normal.    Imaging revealed right-sided lateral rib fractures of ribs 5-8, a small right apical pneumothorax (5% volume), right base opacity (atelectasis vs PNA), COPD chronic changes.  Also has chronic left-sided rib fractures.  Moderate left-side pleural effusion.  CT chest/abdomen/pelvis showed small volume right hemo-pneumothorax and pulmonary contusions. Rib fractures noted 3-10, with 5th rib fractured in two places.  Also small T7-T9 transverse process fractures.    Patient was admitted to medicine service with surgery consulted.   No chest tube has been required thus far, and no indication for surgical intervention.    1/27: repeat CXR, stable R apical PTX of ~5%, increased right pleural fluid.  Assessment and Plan: * Hemopneumothorax Surgery following. Due to multiple rib fractures. Hold Eliquis Monitor Hbg Monitor respiratory status closely Serial CXR's  Multiple rib fractures See imaging reports.  On CT scan, right ribs 3 through 10 are fractured, with 5th rib two fractures.  Also T7-T9 transverse process fractures. --Pain control  --Schedule Robaxin --Incentive spirometry --PT/OT when able --Lidocaine patch --Serial CXR's  Fall With multiple R rib  fractures and hemopneumothorax.  Pt declined CT head or neck on admission and denied striking her head or LOC. --PT/OT evaluations --Fall precautions  Hypokalemia K replaced on admission. Monitor & replace PRN  Macrocytic anemia Hbg 12.4 on 08/14/22, on admission, Hbg was 10.7 >> 10.5. Monitor Hbg closely with hemopneumothorax. Hold Eliquis for now.   Atrial fibrillation, chronic (HCC) HR 50's to 60's. Continue amiodarone Hold Eliquis due to hemopneumothorax  Chronic diastolic CHF (congestive heart failure) (HCC) Appears euvolemic to dry. Last echo June 2023 EF 55-60%. Hold Lasix with soft BP's Monitor volume status  HTN (hypertension) Softer BP's.   Hold home Lasix, amlodipine  Chronic kidney disease, stage 3a ([place]) Renal function near baseline. Monitor BMP.  Depression with anxiety Continue home regimen  Gout Continue allopurinol  Underweight Body mass index is 17.96 kg/m.   Pressure injury of skin Pressure Injury 08/14/22 Vertebral column Lower;Medial Stage 1 -  Intact skin with non-blanchable redness of a localized area usually over a bony prominence. (Active)  08/14/22 1552  Location: Vertebral column  Location Orientation: Lower;Medial  Staging: Stage 1 -  Intact skin with non-blanchable redness of a localized area usually over a bony prominence.  Wound Description (Comments):   Present on Admission: Yes      Alcohol use Monitor on CIWA first 48 hours of admission, then d/c if no signs of withdrawal.  AKI (acute kidney injury) ([place]) On baseline CKD IIIa. Cr 1.51 today, up from 1.25 yesterday, 1.06 previously. --Continue IV fluids, increase to 75 cc/hr        Subjective: Pt awake sitting up in bed this AM. She continues to have severe pain mostly related to muscle spasms in her right chest wall  with the rib fractures.  She denies other complaints besides pain.   Physical Exam: Vitals:   08/15/22 2134 08/16/22 0022 08/16/22 0422 08/16/22  1216  BP: (!) 157/59 121/61 (!) 125/59 114/68  Pulse: 63 61 (!) 56 64  Resp: '18 18 18 16  '$ Temp: 99.1 F (37.3 C) 98.1 F (36.7 C) 98.4 F (36.9 C) 97.9 F (36.6 C)  TempSrc: Oral  Oral   SpO2: 90% 97% 100% 93%  Weight:       General exam: awake, alert, appears in pain, frail, underweight HEENT: moist mucus membranes, hearing grossly normal  Respiratory system: diminished breath sounds, no wheezes, no rhonchi, normal respiratory effort at rest. Cardiovascular system: normal S1/S2, RRR, no pedal edema.   Central nervous system: A&O x4. no gross focal neurologic deficits, normal speech Extremities: moves all , no edema, normal tone Skin: dry, intact, normal temperature Psychiatry: normal mood, congruent affect, judgement and insight appear normal   Data Reviewed:  Notable labs --- Cr 1.51 from 1.25, Ca 8.6, gap 4, Hbg 9.7 from 10.4  Family Communication: None present. Will attempt to call  Disposition: Status is: Inpatient Remains inpatient appropriate because: Severity of illness with hemopneumothorax requiring very close monitoring.  Pain uncontrolled.    Planned Discharge Destination:  TBD, Home health vs SNF likely    Time spent: 40 minutes  Author: Ezekiel Slocumb, DO 08/16/2022 4:00 PM  For on call review [email or web address].

## 2022-08-16 NOTE — Assessment & Plan Note (Signed)
On baseline CKD IIIa. Cr increased to 1.51 yesterday. Incrased fluids 50 >> 75 cc/hr.  Cr improved, 1.23 today. --Continue NS @ 75 cc/hr

## 2022-08-16 NOTE — Progress Notes (Signed)
VS taken - did not cross over from [facility].

## 2022-08-16 NOTE — Evaluation (Signed)
Physical Therapy Evaluation Patient Details Name: [name] MRN: [id number] DOB: 1941/02/25 Today's Date: 08/16/2022  History of Present Illness  Pt is an 82 y.o. female presenting to hospital 08/14/22 s/p fall with R rib pain.  Imaging showing mildly displaced acute fx's R 3rd-10th ribs (5th rib fx'd in 2 places); small thoracic vertebral transverse process tip fx's T7-T9, and small R hemo-pneumothorax; also chronic L sided rib fx.  Pt admitted with hemopneumothrax and multiple rib fx's.  No current indication for surgery or chest tube.  PMH includes CHF, alcohol abuse, L breast CA s/p lumpectomy, CAD, closed R hip fx 09/20/19, htn, osteopenia, persistent a-fib, psoriasis, wide complex tachycardia, orthostatic hypotension, CKD, MGUS, chronic neck pain and low back pain, lymphedema, L ankle fx sx, cardioversion, carotid endarterectomy L shoulder sx, R THA.  Clinical Impression  Prior to hospital admission, pt was independent with ambulation; lives alone in 1 level home (level entry).  O2 sats 93% on 3 L O2 via nasal cannula at rest beginning of session.  Currently pt is 1-2 assist with bed mobility; R lean noted in sitting (pt reporting d/t R rib fx's) and O2 sats noted to be 87% on 3 L O2--nurse present and cleared PT to increase O2 to 4 L (O2 increased to 93%) to attempt further mobility (pt requesting to stand); pt mod assist x1 to stand 1st trial up to RW (pt only stood briefly before appearing to get a spasm and needing max assist to sit back down again safely onto bed);  pt requesting to attempt to stand 2nd time--pt min assist x2 (nurse assisting) with B hand hold assist to stand up 2nd time but shortly after standing up 2nd time pt appearing to get a spasm again and B knees suddenly buckled requiring 2 assist to sit back down safely onto bed.  Deferred further mobility d/t safety concerns.  Pt 92-93% on 4 L O2 at rest end of session (nurse cleared pt to stay on 4 L end of session).  Pt would  benefit from skilled PT to address noted impairments and functional limitations (see below for any additional details).  Upon hospital discharge, pt would benefit from SNF.    Recommendations for follow up therapy are one component of a multi-disciplinary discharge planning process, led by the attending physician.  Recommendations may be updated based on patient status, additional functional criteria and insurance authorization.  Follow Up Recommendations Skilled nursing-short term rehab (<3 hours/day) Can patient physically be transported by private vehicle: No    Assistance Recommended at Discharge Frequent or constant Supervision/Assistance  Patient can return home with the following  Two people to help with walking and/or transfers;Two people to help with bathing/dressing/bathroom;Assistance with cooking/housework;Assist for transportation;Help with stairs or ramp for entrance    Equipment Recommendations Rolling walker (2 wheels);BSC/3in1;Wheelchair (measurements PT);Wheelchair cushion (measurements PT);Hospital bed  Recommendations for Other Services  OT consult    Functional Status Assessment Patient has had a recent decline in their functional status and demonstrates the ability to make significant improvements in function in a reasonable and predictable amount of time.     Precautions / Restrictions Precautions Precautions: Fall Precaution Comments: R sided rib fx's; monitor O2 Restrictions Weight Bearing Restrictions: No      Mobility  Bed Mobility Overal bed mobility: Needs Assistance Bed Mobility: Supine to Sit, Sit to Supine     Supine to sit: Mod assist, Max assist, HOB elevated Sit to supine: +2 for physical assistance   General bed  mobility comments: assist for trunk and B LE's; vc's for technique; pt got OOB on L side (d/t less painful going towards L)    Transfers Overall transfer level: Needs assistance Equipment used: Rolling walker (2 wheels), 2 person  hand held assist Transfers: Sit to/from Stand Sit to Stand: Min assist, Mod assist, +2 physical assistance           General transfer comment: mod assist x1 to stand 1st trial up to RW (pt only stood briefly before appearing to get a spasm and needing assist to sit back down again safely);  pt requesting to attempt to stand 2nd time--min assist x2 with B hand hold assist to stand up 2nd time but shortly after standing up 2nd time pt appearing to get a spasm again and B knees suddenly buckled requiring assist to sit back down safely onto bed    Ambulation/Gait               General Gait Details: not appropriate at this time  Stairs            Wheelchair Mobility    Modified Rankin (Stroke Patients Only)       Balance Overall balance assessment: Needs assistance Sitting-balance support: Bilateral upper extremity supported, Feet supported Sitting balance-Leahy Scale: Fair Sitting balance - Comments: pt tending to lean towards R side (pt reporting d/t R rib fx pain) Postural control: Right lateral lean Standing balance support: Bilateral upper extremity supported, Reliant on assistive device for balance Standing balance-Leahy Scale: Fair Standing balance comment: steady static standing briefly but unable to maintain standing d/t pt appearing to get spasms and knees buckling                             Pertinent Vitals/Pain Pain Assessment Pain Assessment: 0-10 Pain Score: 6  Pain Location: R sided rib fx's Pain Descriptors / Indicators: Aching, Tender, Sore, Sharp, Shooting Pain Intervention(s): Limited activity within patient's tolerance, Monitored during session, Premedicated before session, Repositioned HR WFL during sessions activities.    Home Living Family/patient expects to be discharged to:: Private residence Living Arrangements: Alone   Type of Home: House (Townhouse) Home Access: Level entry       Home Layout: One [place]: Conservation officer, nature (2 wheels);Rollator (4 wheels);Grab bars - tub/shower;Shower seat - built in      Prior Function Prior Level of Function : Independent/Modified Independent             Mobility Comments: Independent with ambulation. ADLs Comments: Pt reports her neighbor is going to start coming daily to assist with bathing, meals, etc.     Hand Dominance        Extremity/Trunk Assessment   Upper Extremity Assessment Upper Extremity Assessment: Generalized weakness    Lower Extremity Assessment Lower Extremity Assessment: Generalized weakness (at least 3/5 AROM hip flexion, knee flexion/extension, and DF)    Cervical / Trunk Assessment Cervical / Trunk Assessment: Normal  Communication   Communication: No difficulties  Cognition Arousal/Alertness: Awake/alert Behavior During Therapy: WFL for tasks assessed/performed Overall Cognitive Status: Within Functional Limits for tasks assessed                                 General Comments: Pt with difficulty with descriptions at times.        General Comments  Nursing cleared pt for participation in  physical therapy.  Pt agreeable to PT session.  Pt pre-medicated with pain meds.    Exercises     Assessment/Plan    PT Assessment Patient needs continued PT services  PT Problem List Decreased strength;Decreased activity tolerance;Decreased balance;Decreased mobility;Decreased knowledge of use of DME;Decreased knowledge of precautions;Cardiopulmonary status limiting activity;Pain       PT Treatment Interventions DME instruction;Gait training;Functional mobility training;Therapeutic activities;Therapeutic exercise;Balance training;Patient/family education    PT Goals (Current goals can be found in the Care Plan section)  Acute Rehab PT Goals Patient Stated Goal: to improve pain and mobility PT Goal Formulation: With patient Time For Goal Achievement: 08/30/22 Potential to Achieve Goals:  Good    Frequency Min 2X/week     Co-evaluation               AM-PAC PT "6 Clicks" Mobility  Outcome Measure Help needed turning from your back to your side while in a flat bed without using bedrails?: None Help needed moving from lying on your back to sitting on the side of a flat bed without using bedrails?: A Lot Help needed moving to and from a bed to a chair (including a wheelchair)?: A Lot Help needed standing up from a chair using your arms (e.g., wheelchair or bedside chair)?: A Lot Help needed to walk in hospital room?: Total Help needed climbing 3-5 steps with a railing? : Total 6 Click Score: 12    End of Session Equipment Utilized During Treatment: Oxygen (3-4 L O2) Activity Tolerance: Patient limited by pain Patient left: in bed;with call bell/phone within reach;with bed alarm set;with nursing/sitter in room;with SCD's reapplied Nurse Communication: Mobility status;Precautions;Other (comment) (Pt's pain status) PT Visit Diagnosis: Other abnormalities of gait and mobility (R26.89);Muscle weakness (generalized) (M62.81);History of falling (Z91.81);Pain Pain - Right/Left: Right Pain - part of body:  (ribs)    Time: 1410-1450 PT Time Calculation (min) (ACUTE ONLY): 40 min   Charges:   PT Evaluation $PT Eval Low Complexity: 1 Low PT Treatments $Therapeutic Activity: 8-22 mins       Leitha Bleak, PT 08/16/22, 3:25 PM

## 2022-08-16 NOTE — Progress Notes (Signed)
Subjective:  CC: [name] is a 82 y.o. female  Hospital stay day 1,   small right sided hemo-pneumothorax.   HPI: No acute issues reported overnight.  Still has pain  Low  ROS:  General: Denies weight loss, weight gain, fatigue, fevers, chills, and night sweats. Heart: Denies chest pain, palpitations, racing heart, irregular heartbeat, leg pain or swelling, and decreased activity tolerance. Respiratory: Denies breathing difficulty, shortness of breath, wheezing, cough, and sputum. GI: Denies change in appetite, heartburn, nausea, vomiting, constipation, diarrhea, and blood in stool. GU: Denies difficulty urinating, pain with urinating, urgency, frequency, blood in urine.   Objective:   Temp:  [97.7 F (36.5 C)-98.6 F (37 C)] 97.8 F (36.6 C) (01/27 1155) Pulse Rate:  [50-61] 61 (01/27 1155) Resp:  [12-26] 20 (01/27 1155) BP: (80-121)/(51-94) 111/58 (01/27 1155) SpO2:  [89 %-98 %] 91 % (01/27 1155)       Weight: 52 kg BMI (Calculated): 17.95   Intake/Output this shift:   Intake/Output Summary (Last 24 hours) at 08/15/2022 1328 Last data filed at 08/15/2022 1000 Gross per 24 hour  Intake 1080 ml  Output 200 ml  Net 880 ml    Constitutional :  alert, cooperative, appears stated age, and no distress  Respiratory:  clear to auscultation bilaterally  Cardiovascular:  regular rate and rhythm  Gastrointestinal: soft, non-tender; bowel sounds normal; no masses,  no organomegaly.   Skin: Cool and moist.  Tenderness to palpation around right chest wall as expected  Psychiatric: Normal affect, non-agitated, not confused       LABS:     Latest Ref Rng & Units 08/15/2022    1:37 AM 08/14/2022    2:31 AM 07/29/2022    9:14 AM  CMP  Glucose 70 - 99 mg/dL 99  122  120   BUN 8 - 23 mg/dL 27  25  33   Creatinine 0.44 - 1.00 mg/dL 1.25  1.06  1.10   Sodium 135 - 145 mmol/L 137  139  140   Potassium 3.5 - 5.1 mmol/L 4.5  3.4  3.7   Chloride 98 - 111 mmol/L 103  103  100    CO2 22 - 32 mmol/L '26  23  27   '$ Calcium 8.9 - 10.3 mg/dL 9.0  8.6  9.4   Total Protein 6.5 - 8.1 g/dL  6.8  7.6   Total Bilirubin 0.3 - 1.2 mg/dL  0.5  0.8   Alkaline Phos 38 - 126 U/L  66  77   AST 15 - 41 U/L  53  25   ALT 0 - 44 U/L  28  17       Latest Ref Rng & Units 08/15/2022    1:37 AM 08/14/2022    7:59 PM 08/14/2022    4:14 PM  CBC  WBC 4.0 - 10.5 K/uL 8.6     Hemoglobin 12.0 - 15.0 g/dL 10.4  10.6  10.8   Hematocrit 36.0 - 46.0 % 32.5  32.3  33.3   Platelets 150 - 400 K/uL 208       RADS: CLINICAL DATA:  Follow-up right rib fractures and right pneumothorax.   EXAM: PORTABLE CHEST 1 VIEW   COMPARISON:  Chest radiographs dated 08/15/2022 and chest CT dated 08/14/2022.   FINDINGS: Multiple displaced right rib fractures are again demonstrated. Less than 5% right apical pneumothorax with an interval decrease in size. Increased interval increased density in the right mid lung zone as well as increased  ill-defined density in the right lower lung zone. Patchy density in the left mid lung zone and linear density in the left lower lobe is not changed significantly. No gross change in right pleural fluid and decreased left pleural fluid. Stable enlarged cardiac silhouette and old, healed left rib fractures.   IMPRESSION: 1. Less than 5% right apical pneumothorax with an interval decrease in size. 2. Increased right mid and lower lung zone atelectasis and possible pneumonia or pulmonary contusions. 3. Stable left mid lung zone and left lower lobe patchy atelectasis and possible pneumonia or pulmonary contusions. 4. Stable right pleural fluid and decreased left pleural fluid.     Electronically Signed   By: Claudie Revering M.D.   On: 08/16/2022 12:02   Assessment:   Right side hemopneumothorax.  Slight improvement on CXR today..  Recommend continuing pain management and monitor. Otherwise continue his current management.    labs/images/medications/previous chart  entries reviewed personally and relevant changes/updates noted above.

## 2022-08-17 ENCOUNTER — Inpatient Hospital Stay: Payer: Medicare Other

## 2022-08-17 ENCOUNTER — Encounter: Payer: Self-pay | Admitting: Cardiovascular Disease

## 2022-08-17 DIAGNOSIS — S2241XA Multiple fractures of ribs, right side, initial encounter for closed fracture: Secondary | ICD-10-CM | POA: Diagnosis not present

## 2022-08-17 DIAGNOSIS — W19XXXA Unspecified fall, initial encounter: Secondary | ICD-10-CM | POA: Diagnosis not present

## 2022-08-17 DIAGNOSIS — S298XXA Other specified injuries of thorax, initial encounter: Secondary | ICD-10-CM | POA: Diagnosis present

## 2022-08-17 DIAGNOSIS — S270XXA Traumatic pneumothorax, initial encounter: Secondary | ICD-10-CM | POA: Diagnosis not present

## 2022-08-17 DIAGNOSIS — J942 Hemothorax: Secondary | ICD-10-CM | POA: Diagnosis not present

## 2022-08-17 LAB — CBC
HCT: 32.4 % — ABNORMAL LOW (ref 36.0–46.0)
Hemoglobin: 10.2 g/dL — ABNORMAL LOW (ref 12.0–15.0)
MCH: 34.3 pg — ABNORMAL HIGH (ref 26.0–34.0)
MCHC: 31.5 g/dL (ref 30.0–36.0)
MCV: 109.1 fL — ABNORMAL HIGH (ref 80.0–100.0)
Platelets: 209 10*3/uL (ref 150–400)
RBC: 2.97 MIL/uL — ABNORMAL LOW (ref 3.87–5.11)
RDW: 14.2 % (ref 11.5–15.5)
WBC: 9.4 10*3/uL (ref 4.0–10.5)
nRBC: 0 % (ref 0.0–0.2)

## 2022-08-17 LAB — BASIC METABOLIC PANEL
Anion gap: 10 (ref 5–15)
BUN: 35 mg/dL — ABNORMAL HIGH (ref 8–23)
CO2: 22 mmol/L (ref 22–32)
Calcium: 9 mg/dL (ref 8.9–10.3)
Chloride: 105 mmol/L (ref 98–111)
Creatinine, Ser: 1.23 mg/dL — ABNORMAL HIGH (ref 0.44–1.00)
GFR, Estimated: 44 mL/min — ABNORMAL LOW (ref >60)
Glucose, Bld: 110 mg/dL — ABNORMAL HIGH (ref 70–99)
Potassium: 4.8 mmol/L (ref 3.5–5.1)
Sodium: 137 mmol/L (ref 135–145)

## 2022-08-17 LAB — MAGNESIUM: Magnesium: 2 mg/dL (ref 1.7–2.4)

## 2022-08-17 MED ORDER — CYCLOBENZAPRINE HCL 10 MG PO TABS
5.0000 mg | ORAL_TABLET | Freq: Every day | ORAL | Status: DC
  Administered 2022-08-17: 5 mg via ORAL
  Filled 2022-08-17: qty 1

## 2022-08-17 NOTE — Care Management Important Message (Signed)
Important Message  Patient Details  Name: [name] MRN: [id number] Date of Birth: 04/11/1941   Medicare Important Message Given:  Yes     [name] 08/17/2022, 11:35 AM

## 2022-08-17 NOTE — Addendum Note (Signed)
Addendum  created 08/17/22 1024 by Doreen Salvage, CRNA   Intraprocedure Event edited, Intraprocedure Staff edited

## 2022-08-17 NOTE — Assessment & Plan Note (Signed)
Due to mechanical fall

## 2022-08-17 NOTE — Progress Notes (Signed)
[facility] SURGICAL PROGRESS NOTE (cpt 251 [id number])  Hospital Day(s): 3.   Interval History: Patient seen and examined, no acute events or new complaints overnight. Patient reports she is still having significant right lateral rib pain. Feels short of breath secondary to the pain. No fever, chills. Labs are reassuring; Hgb to 10.2 (from 9.9). CXR this morning shows resolution in right PTX.   Review of Systems:  Constitutional: denies fever, chills  HEENT: denies cough or congestion  Respiratory: + SOB (secondary to pain) Cardiovascular: denies chest pain or palpitations  Gastrointestinal: denies N/V Genitourinary: denies burning with urination or urinary frequency Musculoskeletal: + rib pain  Vital signs in last 24 hours: [min-max] current  Temp:  [97.4 F (36.3 C)-98.5 F (36.9 C)] 97.8 F (36.6 C) (01/29 0731) Pulse Rate:  [56-69] 69 (01/29 0731) Resp:  [16-20] 18 (01/29 0731) BP: (77-152)/(57-74) 152/66 (01/29 0731) SpO2:  [88 %-96 %] 88 % (01/29 0731)       Weight: 52 kg BMI (Calculated): 17.95   Intake/Output last 2 shifts:  01/28 0701 - 01/29 0700 In: 1117.6 [P.O.:720; I.V.:397.6] Out: 1150 [Urine:1150]   Physical Exam:  Constitutional: alert, cooperative and no distress  HENT: normocephalic without obvious abnormality  Eyes: PERRL, EOM's grossly intact and symmetric  Respiratory: Short shallow breaths given rib fractures; somewhat conversational dyspneic, on RA Chest: She is tender over right ribs Cardiovascular: regular rate and sinus rhythm  Musculoskeletal: no edema or wounds, motor and sensation grossly intact, NT    Labs:     Latest Ref Rng & Units 08/17/2022    5:02 AM 08/16/2022    4:13 PM 08/16/2022    5:57 AM  CBC  WBC 4.0 - 10.5 K/uL 9.4   8.2   Hemoglobin 12.0 - 15.0 g/dL 10.2  9.9  9.7   Hematocrit 36.0 - 46.0 % 32.4  31.5  30.6   Platelets 150 - 400 K/uL 209   185       Latest Ref Rng & Units 08/17/2022    5:02 AM 08/16/2022    5:57  AM 08/15/2022    1:37 AM  CMP  Glucose 70 - 99 mg/dL 110  98  99   BUN 8 - 23 mg/dL 35  40  27   Creatinine 0.44 - 1.00 mg/dL 1.23  1.51  1.25   Sodium 135 - 145 mmol/L 137  135  137   Potassium 3.5 - 5.1 mmol/L 4.8  4.1  4.5   Chloride 98 - 111 mmol/L 105  107  103   CO2 22 - 32 mmol/L '22  24  26   '$ Calcium 8.9 - 10.3 mg/dL 9.0  8.6  9.0      Imaging studies:   CXR (08/17/2022) personally reviewed showing resolution in right PTX, no evidence of hemothorax, + atelectasis, and radiologist report reviewed below:  IMPRESSION: No persistent visible pneumothorax on the right. Persistent and possibly slightly worsened atelectasis at the right lung base. Lesser atelectasis at the left lung base.  Assessment/Plan:  82 y.o. female with now resolved right pneumothorax and right rib fractures secondary to mechanical fall at home.   - CXR is reassuring; no need for chest tube placement at this time - Continue pain control; ? Lidocaine vs fentanyl patch    - Continue pulmonary toilet; encouraged frequent IS use - Ensure no development of PNA - Mobilization as tolerated / feasible   - Further management per primary service    All of the above  findings and recommendations were discussed with the patient, and the medical team, and all of patient's questions were answered to her expressed satisfaction.   -- Edison Simon, PA-C [facility] 08/17/2022, 7:34 AM M-F: 7am - 4pm

## 2022-08-17 NOTE — Evaluation (Signed)
Occupational Therapy Evaluation Patient Details Name: [name] MRN: [id number] DOB: [birth year] Today's Date: 08/17/2022   History of Present Illness Pt is an 82 y.o. female presenting to hospital 08/14/22 s/p fall with R rib pain.  Imaging showing mildly displaced acute fx's R 3rd-10th ribs (5th rib fx'd in 2 places); small thoracic vertebral transverse process tip fx's T7-T9, and small R hemo-pneumothorax; also chronic L sided rib fx.  Pt admitted with hemopneumothrax and multiple rib fx's.  No current indication for surgery or chest tube.  PMH includes CHF, alcohol abuse, L breast CA s/p lumpectomy, CAD, closed R hip fx 09/20/19, htn, osteopenia, persistent a-fib, psoriasis, wide complex tachycardia, orthostatic hypotension, CKD, MGUS, chronic neck pain and low back pain, lymphedema, L ankle fx sx, cardioversion, carotid endarterectomy L shoulder sx, R THA.   Clinical Impression   [name] was seen for OT evaluation this date. Prior to hospital admission, pt was MOD I for mobility and ADLs, endorses falls hx. Pt lives alone. Pt presents to acute OT demonstrating impaired ADL performance and functional mobility 2/2 decreased activity tolerance, pain, and functional strength/ROM/balance deficits. Noted difficulty navigating room phone and cell phone, cues to use.   Pt currently requires MOD A + RW sit<>stand and bed>chair t/f. SpO2 78% on RA (removed briefly during transfer for line mgmt), improved to 86% with 3L [place] donned, resolved to 90% with PLB strategies. Pt would benefit from skilled OT to address noted impairments and functional limitations (see below for any additional details). Upon hospital discharge, recommend STR to maximize pt safety and return to PLOF.    Recommendations for follow up therapy are one component of a multi-disciplinary discharge planning process, led by the attending physician.  Recommendations may be updated based on patient status, additional functional criteria  and insurance authorization.   Follow Up Recommendations  Skilled nursing-short term rehab (<3 hours/day)     Assistance Recommended at Discharge Frequent or constant Supervision/Assistance  Patient can return home with the following Two people to help with walking and/or transfers;A lot of help with bathing/dressing/bathroom;Help with stairs or ramp for entrance    Functional Status Assessment  Patient has had a recent decline in their functional status and demonstrates the ability to make significant improvements in function in a reasonable and predictable amount of time.  Equipment Recommendations  Other (comment) (defer)    Recommendations for Other Services       Precautions / Restrictions Precautions Precautions: Fall Precaution Comments: R sided rib fx's; monitor O2 Restrictions Weight Bearing Restrictions: No      Mobility Bed Mobility Overal bed mobility: Needs Assistance Bed Mobility: Supine to Sit     Supine to sit: Mod assist, HOB elevated          Transfers Overall transfer level: Needs assistance Equipment used: Rolling walker (2 wheels) Transfers: Sit to/from Stand, Bed to chair/wheelchair/BSC Sit to Stand: Mod assist     Step pivot transfers: Mod assist            Balance Overall balance assessment: Needs assistance Sitting-balance support: Bilateral upper extremity supported, Feet supported Sitting balance-Leahy Scale: Fair     Standing balance support: Bilateral upper extremity supported, Reliant on assistive device for balance Standing balance-Leahy Scale: Fair                             ADL either performed or assessed with clinical judgement   ADL Overall ADL's : Needs  assistance/impaired                                       General ADL Comments: MOD A + RW simulated BSC t/f. MAX A for LB access seated EOB. SETUP self-feeding      Pertinent Vitals/Pain Pain Assessment Pain Assessment:  Faces Faces Pain Scale: Hurts even more Pain Location: R sided rib fx's Pain Descriptors / Indicators: Aching, Tender, Sore, Sharp, Shooting Pain Intervention(s): Limited activity within patient's tolerance, Repositioned     Hand Dominance     Extremity/Trunk Assessment Upper Extremity Assessment Upper Extremity Assessment: Generalized weakness   Lower Extremity Assessment Lower Extremity Assessment: Generalized weakness   Cervical / Trunk Assessment Cervical / Trunk Assessment: Normal   Communication Communication Communication: No difficulties   Cognition Arousal/Alertness: Awake/alert Behavior During Therapy: WFL for tasks assessed/performed Overall Cognitive Status: Impaired/Different from baseline Area of Impairment: Safety/judgement, Problem solving                         Safety/Judgement: Decreased awareness of safety, Decreased awareness of deficits   Problem Solving: Slow processing, Decreased initiation, Difficulty sequencing General Comments: difficulty with descriptions (states "blue muffin" repeatedly before stating blueberry muffin). Difficulty navigating cell phone / room phone to answer calls/messages     General Comments  SpO2 78% on RA (removed briefly during transfer for line mgmt), improved to 86% with 3L [place] donned, resolved to 90% with PLB strategies.            Home Living Family/patient expects to be discharged to:: Private residence Living Arrangements: Alone   Type of Home: House Home Access: Level entry     [place]: One level     Bathroom Shower/Tub: Occupational psychologist: Handicapped height     Home Equipment: Conservation officer, nature (2 wheels);Rollator (4 wheels);Grab bars - tub/shower;Shower seat - built in          Prior Functioning/Environment Prior Level of Function : Independent/Modified Independent               ADLs Comments: Pt reports her neighbor is going to start coming daily to assist with  bathing, meals, etc.        OT Problem List: Decreased strength;Decreased range of motion;Decreased activity tolerance;Impaired balance (sitting and/or standing);Decreased safety awareness;Impaired UE functional use      OT Treatment/Interventions: Self-care/ADL training;Therapeutic exercise;DME and/or AE instruction;Energy conservation;Therapeutic activities;Cognitive remediation/compensation;Balance training;Patient/family education    OT Goals(Current goals can be found in the care plan section) Acute Rehab OT Goals Patient Stated Goal: to improve pain OT Goal Formulation: With patient Time For Goal Achievement: 08/31/22 Potential to Achieve Goals: Good ADL Goals Pt Will Perform Grooming: with modified independence;standing Pt Will Perform Lower Body Dressing: sit to/from stand;with supervision;with set-up Pt Will Transfer to Toilet: with min guard assist;ambulating;bedside commode  OT Frequency: Min 2X/week    Co-evaluation              AM-PAC OT "6 Clicks" Daily Activity     Outcome Measure Help from another person eating meals?: None Help from another person taking care of personal grooming?: A Little Help from another person toileting, which includes using toliet, bedpan, or urinal?: A Lot Help from another person bathing (including washing, rinsing, drying)?: A Lot Help from another person to put on and taking off regular upper body clothing?: A  Little Help from another person to put on and taking off regular lower body clothing?: A Lot 6 Click Score: 16   End of Session Nurse Communication: Mobility status  Activity Tolerance: Patient tolerated treatment well Patient left: in chair;with call bell/phone within reach;with chair alarm set  OT Visit Diagnosis: Other abnormalities of gait and mobility (R26.89);Muscle weakness (generalized) (M62.81)                Time: 3524-8185 OT Time Calculation (min): 21 min Charges:  OT General Charges $OT Visit: 1 Visit OT  Evaluation $OT Eval Moderate Complexity: 1 Mod OT Treatments $Self Care/Home Management : 8-22 mins  Dessie Coma, M.S. OTR/L  08/17/22, 10:41 AM  ascom [phone]

## 2022-08-17 NOTE — Progress Notes (Signed)
Progress Note   Patient: [name] IWL:[id number] DOB: Nov 20, 1940 DOA: 08/14/2022     3 DOS: the patient was seen and examined on 08/17/2022   Brief hospital course: [name] is a 82 y.o. female with medical history significant of A-fib on Eliquis, alcohol abuse, hypertension, CKD-3a, hyperlipidemia, diastolic CHF, gout, depression with anxiety, MGUS, psoriasis, left breast cancer, anemia, who presented on 08/14/2022 for evaluation after a mechanical fall and subsequent severe right rib cage pain.  She denied hitting head or LOC, reportedly declined CT's of head and neck.   ED Course: pt was found to have hemoglobin 10.7 (12.4 on 08/14/2022), stable renal function, afebrile and stable vitals with BP low/normal.    Imaging revealed right-sided lateral rib fractures of ribs 5-8, a small right apical pneumothorax (5% volume), right base opacity (atelectasis vs PNA), COPD chronic changes.  Also has chronic left-sided rib fractures.  Moderate left-side pleural effusion.  CT chest/abdomen/pelvis showed small volume right hemo-pneumothorax and pulmonary contusions. Rib fractures noted 3-10, with 5th rib fractured in two places.  Also small T7-T9 transverse process fractures.    Patient was admitted to medicine service with surgery consulted.   No chest tube has been required thus far, and no indication for surgical intervention.    1/27: repeat CXR, stable R apical PTX of ~5%, increased right pleural fluid.  Assessment and Plan: * Hemopneumothorax Surgery following. Due to multiple rib fractures. Hold Eliquis Monitor Hbg Monitor respiratory status closely Serial CXR's  Multiple rib fractures See imaging reports.  On CT scan, right ribs 3 through 10 are fractured, with 5th rib two fractures.  Also T7-T9 transverse process fractures. --Pain control - scheduled Tylenol and PRN oxycodone, PRN morphine for breakthrough --Continue scheduled Robaxin --Low dose Flexeril at  bedtime --Incentive spirometry --PT/OT when able --Lidocaine patch --Serial CXR's  Fall With multiple R rib fractures and hemopneumothorax.  Pt declined CT head or neck on admission and denied striking her head or LOC. --PT/OT evaluations --Fall precautions  Hypokalemia K replaced on admission. Monitor & replace PRN  Macrocytic anemia Hbg 12.4 on 08/14/22, on admission, Hbg was 10.7 >> 10.5 >> nadir 9.7 >> 9.9 >> 10.2. Monitor Hbg closely with hemopneumothorax. Hold Eliquis for now.   Atrial fibrillation, chronic (HCC) HR 50's to 60's. Continue amiodarone Hold Eliquis due to hemopneumothorax  Chronic diastolic CHF (congestive heart failure) (HCC) Appears euvolemic to dry. Last echo June 2023 EF 55-60%. Hold Lasix with soft BP's Monitor volume status  HTN (hypertension) Softer BP's.   Hold home Lasix, amlodipine  Chronic kidney disease, stage 3a ([place]) Renal function near baseline. Monitor BMP.  Depression with anxiety Continue home regimen  Gout Continue allopurinol  Underweight Body mass index is 17.96 kg/m.   Pressure injury of skin Pressure Injury 08/14/22 Vertebral column Lower;Medial Stage 1 -  Intact skin with non-blanchable redness of a localized area usually over a bony prominence. (Active)  08/14/22 1552  Location: Vertebral column  Location Orientation: Lower;Medial  Staging: Stage 1 -  Intact skin with non-blanchable redness of a localized area usually over a bony prominence.  Wound Description (Comments):   Present on Admission: Yes      Alcohol use No withdrawal signs.  D/C CIWA orders.  Blunt injury to chest Due to mechanical fall  AKI (acute kidney injury) ([place]) On baseline CKD IIIa. Cr increased to 1.51 yesterday. Incrased fluids 50 >> 75 cc/hr.  Cr improved, 1.23 today. --Continue NS @ 75 cc/hr  Subjective: Pt awake sitting up in bed this AM. Her son is visiting.  Pt reports a bad night, had a lot of muscle spasms  all night.  Otherwise she says the scheduled Robaxin has been very helpful.  She is agreeable to trying a different muscle relaxer at bedtime.  No other acute complaints.    Physical Exam: Vitals:   08/17/22 0415 08/17/22 0731 08/17/22 0735 08/17/22 1131  BP: 114/74 (!) 152/66  (!) 151/66  Pulse: 66 69  66  Resp:  18  16  Temp:  97.8 F (36.6 C)  97.8 F (36.6 C)  TempSrc:      SpO2:  (!) 88% 94% 94%  Weight:       General exam: awake, appears drowsy, frail, underweight HEENT: moist mucus membranes, hearing grossly normal  Respiratory system: diminished breath sounds, no wheezes, no rhonchi, normal respiratory effort at rest. Cardiovascular system: normal S1/S2, RRR, no pedal edema.   Central nervous system: A&O x4. no gross focal neurologic deficits, normal speech Extremities: moves all , no edema, normal tone Skin: dry, intact, normal temperature Psychiatry: normal mood, congruent affect, judgement and insight appear normal   Data Reviewed:  Notable labs --- Cr improved 1.51 >> 1.23, BUN 35, glucose 110.  Hbg 9.9 >> 10.2  Family Communication: Son at bedside on rounds today 1/29  Disposition: Status is: Inpatient Remains inpatient appropriate because: Severity of illness with hemopneumothorax requiring very close monitoring.  Pain uncontrolled.  Likely to need SNF/rehab.    Planned Discharge Destination:  TBD, Home health vs SNF likely    Time spent: 40 minutes  Author: Ezekiel Slocumb, DO 08/17/2022 1:30 PM  For on call review [email or web address].

## 2022-08-18 ENCOUNTER — Other Ambulatory Visit: Payer: Self-pay

## 2022-08-18 DIAGNOSIS — J181 Lobar pneumonia, unspecified organism: Secondary | ICD-10-CM | POA: Diagnosis not present

## 2022-08-18 DIAGNOSIS — J9601 Acute respiratory failure with hypoxia: Secondary | ICD-10-CM | POA: Diagnosis not present

## 2022-08-18 DIAGNOSIS — J90 Pleural effusion, not elsewhere classified: Secondary | ICD-10-CM | POA: Diagnosis not present

## 2022-08-18 DIAGNOSIS — J942 Hemothorax: Secondary | ICD-10-CM | POA: Diagnosis not present

## 2022-08-18 LAB — BASIC METABOLIC PANEL
Anion gap: 4 — ABNORMAL LOW (ref 5–15)
BUN: 24 mg/dL — ABNORMAL HIGH (ref 8–23)
CO2: 23 mmol/L (ref 22–32)
Calcium: 8.6 mg/dL — ABNORMAL LOW (ref 8.9–10.3)
Chloride: 108 mmol/L (ref 98–111)
Creatinine, Ser: 0.82 mg/dL (ref 0.44–1.00)
GFR, Estimated: >60 mL/min (ref >60)
Glucose, Bld: 107 mg/dL — ABNORMAL HIGH (ref 70–99)
Potassium: 4.1 mmol/L (ref 3.5–5.1)
Sodium: 135 mmol/L (ref 135–145)

## 2022-08-18 LAB — CBC
HCT: 31.4 % — ABNORMAL LOW (ref 36.0–46.0)
Hemoglobin: 10 g/dL — ABNORMAL LOW (ref 12.0–15.0)
MCH: 34.2 pg — ABNORMAL HIGH (ref 26.0–34.0)
MCHC: 31.8 g/dL (ref 30.0–36.0)
MCV: 107.5 fL — ABNORMAL HIGH (ref 80.0–100.0)
Platelets: 254 K/uL (ref 150–400)
RBC: 2.92 MIL/uL — ABNORMAL LOW (ref 3.87–5.11)
RDW: 13.9 % (ref 11.5–15.5)
WBC: 9.6 K/uL (ref 4.0–10.5)
nRBC: 0 % (ref 0.0–0.2)

## 2022-08-18 LAB — BRAIN NATRIURETIC PEPTIDE: B Natriuretic Peptide: 522.1 pg/mL — ABNORMAL HIGH (ref 0.0–100.0)

## 2022-08-18 LAB — PROCALCITONIN: Procalcitonin: 0.3 ng/mL

## 2022-08-18 MED ORDER — FUROSEMIDE 10 MG/ML IJ SOLN
20.0000 mg | Freq: Once | INTRAMUSCULAR | Status: AC
  Administered 2022-08-18: 20 mg via INTRAVENOUS
  Filled 2022-08-18: qty 2

## 2022-08-18 MED ORDER — SODIUM CHLORIDE 0.9 % IV SOLN
1.0000 g | INTRAVENOUS | Status: DC
  Administered 2022-08-18 – 2022-08-24 (×7): 1 g via INTRAVENOUS
  Filled 2022-08-18 (×7): qty 10

## 2022-08-18 MED ORDER — GUAIFENESIN-DM 100-10 MG/5ML PO SYRP: 5.0000 mL | ORAL_SOLUTION | ORAL | Status: DC | PRN

## 2022-08-18 NOTE — Assessment & Plan Note (Signed)
Due to rib fractures initially, now seem to be developing PNA and ?fluid overload with pleural effusions.  She did get couple days gentle fluids for AKI and now increased WOB and O2 needs. 1/30: O2 needs up overnight, this AM on 12 L/min NRB mask with O2 sat low 90's at rest. --Supplement O2, goal sats 90-94% --Wean as tolerated --Mgmt of underlying issues as outlined --Incentive spirometry

## 2022-08-18 NOTE — Progress Notes (Signed)
Patient was very drowsy during med pass, but was oriented and answered questions appropriately and took medications well. Her vitals were obtained and oxygen saturation was found to be 85% on 4L. Oxygen was increased to 6L with no change to oxygen saturation. Patient was placed on a non-rebreather at 13L and oxygen saturation increased to 97%. Provider was notified and chest xray and continuous pulse ox were ordered. Provider also instructed to attempt to wean patient to high-flow nasal cannula.

## 2022-08-18 NOTE — Assessment & Plan Note (Addendum)
Small left, moderate right. 1/30 - O2 needs rising, increased WOB. Given Lasix 20 mg IV  --Further IV lasix today --Hopefully no need for thoracentesis --Monitor respiratory status closely

## 2022-08-18 NOTE — Progress Notes (Signed)
CROSS COVER NOTE  NAME: [name] MRN: [id number] DOB : March 29, 1941 ATTENDING PHYSICIAN: Ezekiel Slocumb, DO    Date of Service   08/18/2022   HPI/Events of Note   Message received from RN reporting increase in oxygen requirement. Patient's SPO2 was 85% on 6L  is on NRB 13L with SPO2 85%. Lungs sounds are clear and diminished.  Interventions   Assessment/Plan:  High flow Nasal Cannula--> Now on 8L ; wean as able CXR--> (L) lower lobe opacity favoring atelectasis, no pneumothorax seen Incentive Spirometry Pulmonary Toilet     To reach the provider On-Call:   7AM- 7PM see care teams to locate the attending and reach out to them via [email or web address] Password: [email or web address] 7PM-7AM contact night-coverage If you still have difficulty reaching the appropriate provider, please page the [facility] (Director on Call) for [facility] on amion for assistance  This document was prepared using Systems analyst and may include unintentional dictation errors.  Neomia Glass DNP, MBA, FNP-BC, PMHNP-BC Nurse Practitioner [facility] Pager [phone]

## 2022-08-18 NOTE — Progress Notes (Addendum)
Progress Note   Patient: [name] EHU:[id number] DOB: Aug 13, 1940 DOA: 08/14/2022     4 DOS: the patient was seen and examined on 08/18/2022   Brief hospital course: [name] is a 82 y.o. female with medical history significant of A-fib on Eliquis, alcohol abuse, hypertension, CKD-3a, hyperlipidemia, diastolic CHF, gout, depression with anxiety, MGUS, psoriasis, left breast cancer, anemia, who presented on 08/14/2022 for evaluation after a mechanical fall and subsequent severe right rib cage pain.  She denied hitting head or LOC, reportedly declined CT's of head and neck.   ED Course: pt was found to have hemoglobin 10.7 (12.4 on 08/14/2022), stable renal function, afebrile and stable vitals with BP low/normal.    Imaging revealed right-sided lateral rib fractures of ribs 5-8, a small right apical pneumothorax (5% volume), right base opacity (atelectasis vs PNA), COPD chronic changes.  Also has chronic left-sided rib fractures.  Moderate left-side pleural effusion.  CT chest/abdomen/pelvis showed small volume right hemo-pneumothorax and pulmonary contusions. Rib fractures noted 3-10, with 5th rib fractured in two places.  Also small T7-T9 transverse process fractures.    Patient was admitted to medicine service with surgery consulted.   No chest tube has been required thus far, and no indication for surgical intervention.    1/27: repeat CXR, stable R apical PTX of ~5%, increased right pleural fluid. 1/29: no residual PTX on CXR today.  Stable but pain remains uncontrolled.   1/30: increased O2 needs overnight.  Started antibiotic, given a dose IV Lasix.  Suspect some fluid overload and ?developing pneumonia.  Assessment and Plan: * Multiple rib fractures See imaging reports.  On CT scan, right ribs 3 through 10 are fractured, with 5th rib two fractures.  Also T7-T9 transverse process fractures. --Pain control - scheduled Tylenol and PRN oxycodone, PRN morphine for  breakthrough --Continue scheduled Robaxin --D/C Flexeril at bedtime, suspect over sedation contributed to increased O2 needs --Incentive spirometry --PT/OT when able --Lidocaine patch --Serial CXR's  Bilateral pleural effusion Small left, moderate right. 1/30 - O2 needs rising Lasix 20 mg IV x 1 today & additional diuresis as needed Hopefully no need for thoracentesis Monitor respiratory status closely  Acute respiratory failure with hypoxia (HCC) Due to rib fractures initially, now seem to be developing PNA and ?fluid overload with pleural effusions.  She did get couple days gentle fluids for AKI and now increased WOB and O2 needs. 1/30: O2 needs up overnight, this AM on 12 L/min NRB mask with O2 sat low 90's at rest. --Supplement O2, goal sats 90-94% --Wean as tolerated --Mgmt of underlying issues as outlined --Incentive spirometry  Lobar pneumonia (HCC) Pt likely developing a pneumonia secondary to multiple rib fractures and very poor inspirations.  --Start Rocephin --Supportive care per orders --O2 per protocol  Hemopneumothorax Surgery following. Due to multiple rib fractures. Hold Eliquis Monitor Hbg Monitor respiratory status closely Serial CXR's  Hypokalemia K replaced on admission. Monitor & replace PRN  AKI (acute kidney injury) ([place]) On baseline CKD IIIa. Cr increased to 1.51 yesterday. Incrased fluids 50 >> 75 cc/hr.  Cr improved, 1.23 today. --Continue NS @ 75 cc/hr  Macrocytic anemia Hbg 12.4 on 08/14/22, on admission, Hbg was 10.7 >> 10.5 >> nadir 9.7 >> 9.9 >> 10.2. Monitor Hbg closely with hemopneumothorax. Hold Eliquis for now.   Chronic kidney disease, stage 3a ([place]) Renal function near baseline. Monitor BMP.  Chronic diastolic CHF (congestive heart failure) (HCC) Appears euvolemic to dry. Last echo June 2023 EF 55-60%. Hold  home Lasix with soft BP's Monitor volume status 1/30 - IV Lasix 20 mg x 1 Further diuresis PRN Strict I/O's &  daily weights  HTN (hypertension) Softer BP's.   Hold home Lasix, amlodipine  Blunt injury to chest Due to mechanical fall  Atrial fibrillation, chronic (HCC) HR 50's to 60's. Continue amiodarone Hold Eliquis due to hemopneumothorax  Alcohol use No withdrawal signs.  D/C CIWA orders.  Depression with anxiety Continue home regimen  Gout Continue allopurinol  Fall With multiple R rib fractures and hemopneumothorax.  Pt declined CT head or neck on admission and denied striking her head or LOC. --PT/OT evaluations --Fall precautions  Underweight Body mass index is 17.96 kg/m.   Pressure injury of skin Pressure Injury 08/14/22 Vertebral column Lower;Medial Stage 1 -  Intact skin with non-blanchable redness of a localized area usually over a bony prominence. (Active)  08/14/22 1552  Location: Vertebral column  Location Orientation: Lower;Medial  Staging: Stage 1 -  Intact skin with non-blanchable redness of a localized area usually over a bony prominence.  Wound Description (Comments):   Present on Admission: Yes            Subjective: Pt awake sitting up in bed this AM. Her daughter is visiting.  Pt having difficulty working to breath this AM.  She is asking to have mask removed as soon as possible.  Aside from pain and trouble breathing she denies acute complaints.     Physical Exam: Vitals:   08/18/22 0727 08/18/22 0914 08/18/22 1030 08/18/22 1140  BP: 127/73  139/82 (!) 151/91  Pulse: 83  85 (!) 102  Resp: (!) 22   20  Temp: 97.9 F (36.6 C)   97.9 F (36.6 C)  TempSrc:      SpO2: 92%   90%  Weight:  62.7 kg     General exam: awake, appears drowsy, frail, underweight HEENT: moist mucus membranes, hearing grossly normal  Respiratory system: very diminished breath sounds R>L, no wheezes, increase respiratory effort at rest with accessory muscle use.  On NRB mask at 12 L/min O2. Cardiovascular system: normal S1/S2, RRR, no pedal edema.   Central  nervous system: A&O x4. no gross focal neurologic deficits, normal speech Extremities: moves all , no edema, normal tone Skin: dry, intact, normal temperature Psychiatry: normal mood, congruent affect, judgement and insight appear normal   Data Reviewed:  Notable labs --- Cr improved 1.51 >> 1.23 >>0.82, BUN 24, glucose 107.  Hbg 9.9 >> 10.2 >> 10.0  Family Communication: Daughter Sharee Pimple at bedside on rounds today 1/30.  Also updated daughter Amy by phone (lives in [place], she is medical POA, getting flight here)  Disposition: Status is: Inpatient Remains inpatient appropriate because: Severity of illness with hemopneumothorax requiring very close monitoring.  Pain uncontrolled.  Likely to need SNF/rehab.    Planned Discharge Destination:  TBD, Home health vs SNF likely    Time spent: 40 minutes  Author: Ezekiel Slocumb, DO 08/18/2022 1:28 PM  For on call review [email or web address].

## 2022-08-18 NOTE — TOC Progression Note (Signed)
Transition of Care [facility]) - Progression Note    Patient Details  Name: [name] MRN: [id number] Date of Birth: Mar 09, 1941  Transition of Care [facility]) CM/SW Contact  Laurena Slimmer, RN Phone Number: 08/18/2022, 10:51 AM  Clinical Narrative:    Attempt to speak with patient regarding therpay recommendation for SNF. Patient refused. Will try back at a later time.         Expected Discharge Plan and Services                                               Social Determinants of Health (SDOH) Interventions SDOH Screenings   Food Insecurity: No Food Insecurity (02/13/2022)  Housing: Low Risk  (02/13/2022)  Transportation Needs: No Transportation Needs (02/13/2022)  Depression (PHQ2-9): Low Risk  (08/04/2022)  Financial Resource Strain: Low Risk  (02/13/2022)  Physical Activity: Unknown (02/10/2021)  Social Connections: Unknown (02/13/2022)  Stress: No Stress Concern Present (02/13/2022)  Tobacco Use: Medium Risk (08/17/2022)    Readmission Risk Interventions     No data to display

## 2022-08-18 NOTE — Plan of Care (Signed)

## 2022-08-18 NOTE — Progress Notes (Signed)
PT Cancellation Note  Patient Details Name: [name] MRN: [id number] DOB: [birth year]   Cancelled Treatment:    Reason Eval/Treat Not Completed: Medical issues which prohibited therapy Chart reviewed, spoke with nursing about appropriateness for PT this date.  Pt now with increased O2 requirement (now up to 12L) and apparently had a very rough night; not appropriate for PT this date.  Will maintain on caseload and attempt to treat as appropriate?   Kreg Shropshire, DPT 08/18/2022, 5:57 PM

## 2022-08-18 NOTE — Progress Notes (Signed)
Patient was placed on high flow nasal cannula at 8L at approximately 2 am. Oxygen saturation was 98%. Oxygen flow adjusted to 7L at 0227 and saturation stable at 95%.

## 2022-08-18 NOTE — Assessment & Plan Note (Signed)
Pt likely developing a pneumonia secondary to multiple rib fractures and very poor inspirations.  --Start Rocephin --Supportive care per orders --O2 per protocol

## 2022-08-19 ENCOUNTER — Inpatient Hospital Stay: Payer: Medicare Other

## 2022-08-19 DIAGNOSIS — J181 Lobar pneumonia, unspecified organism: Secondary | ICD-10-CM | POA: Diagnosis not present

## 2022-08-19 DIAGNOSIS — J942 Hemothorax: Secondary | ICD-10-CM | POA: Diagnosis not present

## 2022-08-19 DIAGNOSIS — J90 Pleural effusion, not elsewhere classified: Secondary | ICD-10-CM | POA: Diagnosis not present

## 2022-08-19 DIAGNOSIS — J9601 Acute respiratory failure with hypoxia: Secondary | ICD-10-CM | POA: Diagnosis not present

## 2022-08-19 LAB — CBC
HCT: 32.3 % — ABNORMAL LOW (ref 36.0–46.0)
Hemoglobin: 10.7 g/dL — ABNORMAL LOW (ref 12.0–15.0)
MCH: 34.7 pg — ABNORMAL HIGH (ref 26.0–34.0)
MCHC: 33.1 g/dL (ref 30.0–36.0)
MCV: 104.9 fL — ABNORMAL HIGH (ref 80.0–100.0)
Platelets: 268 10*3/uL (ref 150–400)
RBC: 3.08 MIL/uL — ABNORMAL LOW (ref 3.87–5.11)
RDW: 14 % (ref 11.5–15.5)
WBC: 10 10*3/uL (ref 4.0–10.5)
nRBC: 0 % (ref 0.0–0.2)

## 2022-08-19 LAB — BASIC METABOLIC PANEL
Anion gap: 9 (ref 5–15)
BUN: 18 mg/dL (ref 8–23)
CO2: 24 mmol/L (ref 22–32)
Calcium: 9.1 mg/dL (ref 8.9–10.3)
Chloride: 103 mmol/L (ref 98–111)
Creatinine, Ser: 0.73 mg/dL (ref 0.44–1.00)
GFR, Estimated: >60 mL/min (ref >60)
Glucose, Bld: 100 mg/dL — ABNORMAL HIGH (ref 70–99)
Potassium: 3.6 mmol/L (ref 3.5–5.1)
Sodium: 136 mmol/L (ref 135–145)

## 2022-08-19 MED ORDER — FUROSEMIDE 10 MG/ML IJ SOLN
40.0000 mg | Freq: Once | INTRAMUSCULAR | Status: AC
  Administered 2022-08-19: 40 mg via INTRAVENOUS
  Filled 2022-08-19: qty 4

## 2022-08-19 NOTE — Progress Notes (Signed)
Physical Therapy Treatment Patient Details Name: [name] MRN: [id number] DOB: 1940-10-25 Today's Date: 08/19/2022   History of Present Illness Pt is an 82 y.o. female presenting to hospital 08/14/22 s/p fall with R rib pain.  Imaging showing mildly displaced acute fx's R 3rd-10th ribs (5th rib fx'd in 2 places); small thoracic vertebral transverse process tip fx's T7-T9, and small R hemo-pneumothorax; also chronic L sided rib fx.  Pt admitted with hemopneumothrax and multiple rib fx's.  No current indication for surgery or chest tube.  PMH includes CHF, alcohol abuse, L breast CA s/p lumpectomy, CAD, closed R hip fx 09/20/19, htn, osteopenia, persistent a-fib, psoriasis, wide complex tachycardia, orthostatic hypotension, CKD, MGUS, chronic neck pain and low back pain, lymphedema, L ankle fx sx, cardioversion, carotid endarterectomy L shoulder sx, R THA.    PT Comments    Patient received in recliner, just finished bath with NT. Patient ready to get back into bed. Patient is fatigued. O2 sats at 99% on 12 liters. She is able to stand with min +2 assist and take a few steps from recliner to bed with RW and min +2 assist. Patient sat prematurely and required min/mod +2 assist to safely land on bed. Patient requires min +2 assist for positioning in bed. Motivated and performed LE strengthening exercises despite being very fatigued. Patient will continue to benefit from skilled PT to improve strength and activity tolerance.         Recommendations for follow up therapy are one component of a multi-disciplinary discharge planning process, led by the attending physician.  Recommendations may be updated based on patient status, additional functional criteria and insurance authorization.  Follow Up Recommendations  Skilled nursing-short term rehab (<3 hours/day) Can patient physically be transported by private vehicle: No   Assistance Recommended at Discharge Frequent or constant  Supervision/Assistance  Patient can return home with the following Two people to help with walking and/or transfers;Two people to help with bathing/dressing/bathroom   Equipment Recommendations  Rolling walker (2 wheels);Wheelchair (measurements PT);Wheelchair cushion (measurements PT)    Recommendations for Other Services       Precautions / Restrictions Precautions Precautions: Fall Precaution Comments: R sided rib fx's; monitor O2 Restrictions Weight Bearing Restrictions: No     Mobility  Bed Mobility Overal bed mobility: Needs Assistance Bed Mobility: Sit to Supine       Sit to supine: Min assist, HOB elevated   General bed mobility comments: assist to straighten in bed, +2 to slide up in bed.    Transfers Overall transfer level: Needs assistance Equipment used: Rolling walker (2 wheels) Transfers: Sit to/from Stand Sit to Stand: Min assist, +2 physical assistance Stand pivot transfers: Min assist         General transfer comment: min A to take a few steps from chair to bed with RW. Patient sat prior to being safely backed up to the bed. Required +2 min/mod A to safely land on bed.    Ambulation/Gait Ambulation/Gait assistance: Min assist Gait Distance (Feet): 3 Feet Assistive device: Rolling walker (2 wheels) Gait Pattern/deviations: Step-to pattern, Decreased step length - right, Decreased step length - left, Decreased stride length           Stairs             Wheelchair Mobility    Modified Rankin (Stroke Patients Only)       Balance Overall balance assessment: Needs assistance Sitting-balance support: Feet supported Sitting balance-Leahy Scale: Fair  Standing balance support: Bilateral upper extremity supported, During functional activity, Reliant on assistive device for balance Standing balance-Leahy Scale: Fair                              Cognition Arousal/Alertness: Awake/alert Behavior During Therapy: WFL for  tasks assessed/performed Overall Cognitive Status: Within Functional Limits for tasks assessed                                          Exercises Other Exercises Other Exercises: B LE exercises: SLR, heel slides, hip abd/add x 10 reps each    General Comments General comments (skin integrity, edema, etc.): Pt on 12L O2 via venturi mask t/o, desatting to 85% with activity, however, improved quickly with seated rest break and PLB.      Pertinent Vitals/Pain Pain Assessment Pain Assessment: Faces Faces Pain Scale: Hurts a little bit Pain Location: R sided rib fx's Pain Descriptors / Indicators: Discomfort, Sore Pain Intervention(s): Monitored during session, Repositioned    Home Living                          Prior Function            PT Goals (current goals can now be found in the care plan section) Acute Rehab PT Goals Patient Stated Goal: to improve pain and mobility PT Goal Formulation: With patient Time For Goal Achievement: 08/30/22 Potential to Achieve Goals: Fair Progress towards PT goals: Progressing toward goals    Frequency    Min 2X/week      PT Plan Current plan remains appropriate    Co-evaluation              AM-PAC PT "6 Clicks" Mobility   Outcome Measure  Help needed turning from your back to your side while in a flat bed without using bedrails?: A Little Help needed moving from lying on your back to sitting on the side of a flat bed without using bedrails?: A Lot Help needed moving to and from a bed to a chair (including a wheelchair)?: A Lot Help needed standing up from a chair using your arms (e.g., wheelchair or bedside chair)?: A Little Help needed to walk in hospital room?: Total Help needed climbing 3-5 steps with a railing? : Total 6 Click Score: 12    End of Session Equipment Utilized During Treatment: Gait belt;Oxygen Activity Tolerance: Patient limited by fatigue Patient left: in bed;with call  bell/phone within reach;with bed alarm set;with family/visitor present Nurse Communication: Mobility status;Other (comment) (dtr requesting ointment for wound on inside of left ankle. RN notified.) PT Visit Diagnosis: Other abnormalities of gait and mobility (R26.89);Muscle weakness (generalized) (M62.81);History of falling (Z91.81);Pain Pain - Right/Left: Right     Time: 1345-1405 PT Time Calculation (min) (ACUTE ONLY): 20 min  Charges:  $Therapeutic Activity: 8-22 mins                     Saharra Santo, PT, GCS 08/19/22,2:17 PM

## 2022-08-19 NOTE — Progress Notes (Addendum)
Occupational Therapy Treatment Patient Details Name: [name] MRN: [id number] DOB: Jan 22, 1941 Today's Date: 08/19/2022   History of present illness Pt is an 82 y.o. female presenting to hospital 08/14/22 s/p fall with R rib pain.  Imaging showing mildly displaced acute fx's R 3rd-10th ribs (5th rib fx'd in 2 places); small thoracic vertebral transverse process tip fx's T7-T9, and small R hemo-pneumothorax; also chronic L sided rib fx.  Pt admitted with hemopneumothrax and multiple rib fx's.  No current indication for surgery or chest tube.  PMH includes CHF, alcohol abuse, L breast CA s/p lumpectomy, CAD, closed R hip fx 09/20/19, htn, osteopenia, persistent a-fib, psoriasis, wide complex tachycardia, orthostatic hypotension, CKD, MGUS, chronic neck pain and low back pain, lymphedema, L ankle fx sx, cardioversion, carotid endarterectomy L shoulder sx, R THA.   OT comments  Upon entering session, pt resting in bed with daughter present. Both agreeable to OT. Tx session targeted improving tolerance for functional mobility in the setting of ADL tasks. Pt required Min A to come to EOB. She then stood from EOB and completed step pivot transfer to recliner with Min A +2 using RW. Only 1 mild instance of BLE buckling, pt able to self-correct. She required Max A for LB dressing, Min A for UB dressing, and set up-supervision for seated grooming tasks. Pt on 12L O2 via venturi mask t/o. Desatting to 85% with activity, however, improved quickly with seated rest break and PLB. Pt left sitting in recliner with all needs in reach. RN present in room. Pt is making progress toward goal completion. D/C recommendation remains appropriate. OT will continue to follow acutely.     Recommendations for follow up therapy are one component of a multi-disciplinary discharge planning process, led by the attending physician.  Recommendations may be updated based on patient status, additional functional criteria and insurance  authorization.    Follow Up Recommendations  Skilled nursing-short term rehab (<3 hours/day)     Assistance Recommended at Discharge Frequent or constant Supervision/Assistance  Patient can return home with the following  Two people to help with walking and/or transfers;A lot of help with bathing/dressing/bathroom;Help with stairs or ramp for entrance;Assistance with cooking/housework;Assist for transportation   Equipment Recommendations  Other (comment) (defer to next venue of care)    Recommendations for Other Services      Precautions / Restrictions Precautions Precautions: Fall Precaution Comments: R sided rib fx's; monitor O2 Restrictions Weight Bearing Restrictions: No       Mobility Bed Mobility Overal bed mobility: Needs Assistance Bed Mobility: Supine to Sit     Supine to sit: Min assist, HOB elevated          Transfers Overall transfer level: Needs assistance Equipment used: Rolling walker (2 wheels) Transfers: Sit to/from Stand, Bed to chair/wheelchair/BSC Sit to Stand: Min assist, +2 safety/equipment     Step pivot transfers: Min assist, +2 safety/equipment     General transfer comment: multimodal cues for safe technique (anterior weight shift, hand placement, bringing heels behind knees)     Balance Overall balance assessment: Needs assistance Sitting-balance support: Bilateral upper extremity supported, Feet supported Sitting balance-Leahy Scale: Good     Standing balance support: Bilateral upper extremity supported, Reliant on assistive device for balance, During functional activity Standing balance-Leahy Scale: Fair                             ADL either performed or assessed with clinical judgement  ADL Overall ADL's : Needs assistance/impaired     Grooming: Set up;Supervision/safety;Sitting           Upper Body Dressing : Minimal assistance;Sitting   Lower Body Dressing: Maximal assistance;Sitting/lateral  leans Lower Body Dressing Details (indicate cue type and reason): socks                    Extremity/Trunk Assessment Upper Extremity Assessment Upper Extremity Assessment: Generalized weakness   Lower Extremity Assessment Lower Extremity Assessment: Generalized weakness   Cervical / Trunk Assessment Cervical / Trunk Assessment: Normal    Vision Patient Visual Report: No change from baseline     Perception     Praxis      Cognition Arousal/Alertness: Awake/alert Behavior During Therapy: WFL for tasks assessed/performed Overall Cognitive Status: Impaired/Different from baseline Area of Impairment: Safety/judgement, Problem solving                         Safety/Judgement: Decreased awareness of safety, Decreased awareness of deficits   Problem Solving: Slow processing, Decreased initiation, Difficulty sequencing          Exercises      Shoulder Instructions       General Comments Pt on 12L O2 via venturi mask t/o, desatting to 85% with activity, however, improved quickly with seated rest break and PLB.    Pertinent Vitals/ Pain       Pain Assessment Pain Assessment: Faces Faces Pain Scale: Hurts a little bit Pain Location: R sided rib fx's Pain Descriptors / Indicators: Aching, Tender, Sore Pain Intervention(s): Limited activity within patient's tolerance, Monitored during session, Repositioned  Home Living                                          Prior Functioning/Environment              Frequency  Min 2X/week        Progress Toward Goals  OT Goals(current goals can now be found in the care plan section)  Progress towards OT goals: Progressing toward goals  Acute Rehab OT Goals Patient Stated Goal: to improve pain and breathing OT Goal Formulation: With patient Time For Goal Achievement: 08/31/22 Potential to Achieve Goals: Good  Plan Discharge plan remains appropriate;Frequency remains appropriate     Co-evaluation                 AM-PAC OT "6 Clicks" Daily Activity     Outcome Measure   Help from another person eating meals?: None Help from another person taking care of personal grooming?: A Little Help from another person toileting, which includes using toliet, bedpan, or urinal?: A Lot Help from another person bathing (including washing, rinsing, drying)?: A Lot Help from another person to put on and taking off regular upper body clothing?: A Little Help from another person to put on and taking off regular lower body clothing?: A Lot 6 Click Score: 16    End of Session Equipment Utilized During Treatment: Gait belt;Rolling walker (2 wheels);Oxygen  OT Visit Diagnosis: Other abnormalities of gait and mobility (R26.89);Muscle weakness (generalized) (M62.81)   Activity Tolerance Patient tolerated treatment well;Patient limited by fatigue   Patient Left in chair;with call bell/phone within reach;with chair alarm set;with family/visitor present;Other (comment) (RN present)   Nurse Communication Mobility status        Time:  1036-1100 OT Time Calculation (min): 24 min  Charges: OT General Charges $OT Visit: 1 Visit OT Treatments $Self Care/Home Management : 23-37 mins  [facility] MS, OTR/L ascom [phone]  08/19/22, 1:52 PM

## 2022-08-19 NOTE — TOC Progression Note (Signed)
Transition of Care [facility]) - Progression Note    Patient Details  Name: ZAIRAH ARISTA MRN: [id number] Date of Birth: 1941/02/28  [facility]) CM/SW Contact  Laurena Slimmer, RN Phone Number: 08/19/2022, 4:29 PM  Clinical Narrative:    Spoke with patient and her daughter at bedside. To give bed offers for [facility], and [facility]. Patient daughter Amy stated she wanted some time to tour the facilities.         Expected Discharge Plan and Services                                               Social Determinants of Health (SDOH) Interventions SDOH Screenings   Food Insecurity: No Food Insecurity (02/13/2022)  Housing: Low Risk  (02/13/2022)  Transportation Needs: No Transportation Needs (02/13/2022)  Depression (PHQ2-9): Low Risk  (08/04/2022)  Financial Resource Strain: Low Risk  (02/13/2022)  Physical Activity: Unknown (02/10/2021)  Social Connections: Unknown (02/13/2022)  Stress: No Stress Concern Present (02/13/2022)  Tobacco Use: Medium Risk (08/17/2022)    Readmission Risk Interventions     No data to display

## 2022-08-19 NOTE — TOC Progression Note (Signed)
Transition of Care [facility]) - Progression Note    Patient Details  Name: [name] MRN: [id number] Date of Birth: 04/28/1941  Transition of [facility]) CM/SW Contact  Laurena Slimmer, RN Phone Number: 08/19/2022, 12:40 AM  Clinical Narrative:    Ider PASSAR obtained FL2 completed  Bed search started        Expected Discharge Plan and Services                                               Social Determinants of Health (SDOH) Interventions SDOH Screenings   Food Insecurity: No Food Insecurity (02/13/2022)  Housing: Low Risk  (02/13/2022)  Transportation Needs: No Transportation Needs (02/13/2022)  Depression (PHQ2-9): Low Risk  (08/04/2022)  Financial Resource Strain: Low Risk  (02/13/2022)  Physical Activity: Unknown (02/10/2021)  Social Connections: Unknown (02/13/2022)  Stress: No Stress Concern Present (02/13/2022)  Tobacco Use: Medium Risk (08/17/2022)    Readmission Risk Interventions     No data to display

## 2022-08-19 NOTE — Plan of Care (Signed)

## 2022-08-19 NOTE — TOC Progression Note (Signed)
Transition of Care [facility] - Progression Note    Patient Details  Name: [name] MRN: [id number] Date of Birth: 1941/06/17  Transition of Care [facility]) CM/SW Contact  Laurena Slimmer, RN Phone Number: 08/19/2022, 12:01 AM  Clinical Narrative:    Spoke with patient at bedside. She is agreeable to SNFs in [facility]. She is preferable to [facility].         Expected Discharge Plan and Services                                               Social Determinants of Health (SDOH) Interventions SDOH Screenings   Food Insecurity: No Food Insecurity (02/13/2022)  Housing: Low Risk  (02/13/2022)  Transportation Needs: No Transportation Needs (02/13/2022)  Depression (PHQ2-9): Low Risk  (08/04/2022)  Financial Resource Strain: Low Risk  (02/13/2022)  Physical Activity: Unknown (02/10/2021)  Social Connections: Unknown (02/13/2022)  Stress: No Stress Concern Present (02/13/2022)  Tobacco Use: Medium Risk (08/17/2022)    Readmission Risk Interventions     No data to display

## 2022-08-19 NOTE — Progress Notes (Signed)
Progress Note   Patient: [name] DOB: 10/21/1940 DOA: 08/14/2022     5 DOS: the patient was seen and examined on 08/19/2022   Brief hospital course: [name] is a 82 y.o. female with medical history significant of A-fib on Eliquis, alcohol abuse, hypertension, CKD-3a, hyperlipidemia, diastolic CHF, gout, depression with anxiety, MGUS, psoriasis, left breast cancer, anemia, who presented on 08/14/2022 for evaluation after a mechanical fall and subsequent severe right rib cage pain.  She denied hitting head or LOC, reportedly declined CT's of head and neck.   ED Course: pt was found to have hemoglobin 10.7 (12.4 on 08/14/2022), stable renal function, afebrile and stable vitals with BP low/normal.    Imaging revealed right-sided lateral rib fractures of ribs 5-8, a small right apical pneumothorax (5% volume), right base opacity (atelectasis vs PNA), COPD chronic changes.  Also has chronic left-sided rib fractures.  Moderate left-side pleural effusion.  CT chest/abdomen/pelvis showed small volume right hemo-pneumothorax and pulmonary contusions. Rib fractures noted 3-10, with 5th rib fractured in two places.  Also small T7-T9 transverse process fractures.    Patient was admitted to medicine service with surgery consulted.   No chest tube has been required thus far, and no indication for surgical intervention.    1/27: repeat CXR, stable R apical PTX of ~5%, increased right pleural fluid. 1/29: no residual PTX on CXR today.  Stable but pain remains uncontrolled.   1/30: increased O2 needs overnight.  Started antibiotic, given a dose IV Lasix.  Suspect some fluid overload and ?developing pneumonia. 1/31: ongoing work of breathing, tachypnea, on NRB mask borderline/low sats with minimal exertion. Further diuresis.  Assessment and Plan: * Multiple rib fractures See imaging reports.  On CT scan, right ribs 3 through 10 are fractured, with 5th rib two fractures.  Also  T7-T9 transverse process fractures. --Pain control - scheduled Tylenol and PRN oxycodone, PRN morphine for breakthrough --Continue scheduled Robaxin --Incentive spirometry --PT/OT --Lidocaine patch --Serial CXR's  Bilateral pleural effusion Small left, moderate right. 1/30 - O2 needs rising, increased WOB. Given Lasix 20 mg IV  --Further IV lasix today --Hopefully no need for thoracentesis --Monitor respiratory status closely  Acute respiratory failure with hypoxia (HCC) Due to rib fractures initially, now seem to be developing PNA and ?fluid overload with pleural effusions.  She did get couple days gentle fluids for AKI and now increased WOB and O2 needs. 1/30: O2 needs up overnight, this AM on 12 L/min NRB mask with O2 sat low 90's at rest. --Supplement O2, goal sats 90-94% --Wean as tolerated --Mgmt of underlying issues as outlined --Incentive spirometry  Lobar pneumonia (HCC) Pt likely developing a pneumonia secondary to multiple rib fractures and very poor inspirations.  --Start Rocephin --Supportive care per orders --O2 per protocol  Hemopneumothorax Surgery following. Due to multiple rib fractures. Hold Eliquis Monitor Hbg Monitor respiratory status closely Serial CXR's  Hypokalemia K replaced on admission. Monitor & replace PRN  AKI (acute kidney injury) ([place]) On baseline CKD IIIa. Cr increased to 1.51   Cr normalized w / IV hydration. Last Cr 0.73 --Monitor BMP --Caution with diuretics  Macrocytic anemia Hbg 12.4 on 08/14/22, on admission, Hbg was 10.7 >> 10.5 >> nadir 9.7 >> 9.9 >> 10.2 ...10.7 Monitor Hbg closely with hemopneumothorax. Hold Eliquis for now.   Chronic kidney disease, stage 3a ([place]) Renal function currently normal. Monitor BMP.  Chronic diastolic CHF (congestive heart failure) (HCC) Last echo June 2023 EF 55-60%. Home Lasix held  on admission with soft BP's and AKI requiring fluids. Now cautiously diuresing due to signs of volume  overload, increased respiratory distress. 1/30 - IV Lasix 20 mg x 1 1/31 - Further IV Lasix today  --Strict I/O's & daily weights --Monitor renal function, electrolytes  HTN (hypertension) Softer BP's.   Hold home Lasix, amlodipine Getting IV Lasix  Blunt injury to chest Due to mechanical fall  Atrial fibrillation, chronic (HCC) HR 50's to 60's. Continue amiodarone Hold Eliquis due to hemopneumothorax  Alcohol use No withdrawal signs.  D/C CIWA orders.  Depression with anxiety Continue home regimen  Gout Continue allopurinol  Fall With multiple R rib fractures and hemopneumothorax.  Pt declined CT head or neck on admission and denied striking her head or LOC. --PT/OT evaluations --SNF recommended --TOC following for placement --Fall precautions  Underweight Body mass index is 17.96 kg/m.   Pressure injury of skin Pressure Injury 08/14/22 Vertebral column Lower;Medial Stage 1 -  Intact skin with non-blanchable redness of a localized area usually over a bony prominence. (Active)  08/14/22 1552  Location: Vertebral column  Location Orientation: Lower;Medial  Staging: Stage 1 -  Intact skin with non-blanchable redness of a localized area usually over a bony prominence.  Wound Description (Comments):   Present on Admission: Yes            Subjective: Pt awake sitting up in bed this AM. Her daughter is visiting.  She was having significant tachypnea and increased work of breathing earlier, but appears more comfortable after given pain medication.  She reports slowly improving pain control.       Physical Exam: Vitals:   08/19/22 0629 08/19/22 0745 08/19/22 0814 08/19/22 1210  BP:   (!) 147/86 121/70  Pulse:   91 86  Resp:  (!) '31 19 19  '$ Temp:   99.1 F (37.3 C) 97.9 F (36.6 C)  TempSrc:   Oral   SpO2: 93%  96% 97%  Weight:   63 kg    General exam: awake, more alert, frail, underweight HEENT: moist mucus membranes, hearing grossly normal   Respiratory system: continues to have diminished breath sounds R>L, no wheezes, increase respiratory effort at rest with accessory muscle use.  On NRB mask at 12 L/min O2. Cardiovascular system: normal S1/S2, RRR, no pedal edema.   Central nervous system: A&O x4. no gross focal neurologic deficits, normal speech Extremities: moves all , no edema, normal tone Skin: dry, intact, normal temperature Psychiatry: normal mood, congruent affect, judgement and insight appear normal   Data Reviewed:  Notable labs --- Cr normalized.  Hbg improved 10.7  Chest xray today - Stable bilateral lung opacities and pleural effusions as noted above. Stable right rib fractures.  Family Communication: Daughter [name] at bedside on rounds today 1/30.  Also updated daughter [name] by phone (lives in [place], she is medical POA, getting flight here)  Disposition: Status is: Inpatient Remains inpatient appropriate because: Severity of illness with hemopneumothorax requiring very close monitoring.  Pain uncontrolled.  Likely to need SNF/rehab.    Planned Discharge Destination:  TBD, Home health vs SNF likely    Time spent: 36 minutes  Author: Ezekiel Slocumb, DO 08/19/2022 2:45 PM  For on call review [email or web address].

## 2022-08-19 NOTE — NC FL2 (Signed)
[place] LEVEL OF CARE FORM     IDENTIFICATION  Patient Name: [name] Birthdate: 1941/03/23 Sex: female Admission Date (Current Location): 08/14/2022  [facility] and [place] Number:  Engineering geologist and Address:  [facility], [address] [place], [place] [address]      Provider Number: [id number]  Attending Physician Name and Address:  Ezekiel Slocumb, DO  Relative Name and Phone Number:  Sharee Pimple DHRCB,[phone]    Current Level of Care: Hospital Recommended Level of Care: [place] Prior Approval Number:    Date Approved/Denied:   PASRR Number: [id number]  Discharge Plan: Home    Current Diagnoses: Patient Active Problem List   Diagnosis Date Noted   Lobar pneumonia ([place]) 08/18/2022   Acute respiratory failure with hypoxia ([place]) 08/18/2022   Bilateral pleural effusion 08/18/2022   Blunt injury to chest 08/17/2022   AKI (acute kidney injury) ([place]) 08/16/2022   Underweight 08/15/2022   Pressure injury of skin 08/15/2022   Hemopneumothorax on right 08/14/2022   Hemopneumothorax 08/14/2022   Atrial fibrillation, chronic ([place]) 08/14/2022   Macrocytic anemia 08/14/2022   Fall at home, initial encounter 08/14/2022   Secondary hypercoagulable state ([place]) 05/15/2022   Hammer toes of both feet 04/02/2022   Sinus bradycardia 01/15/2022   Orthostatic hypotension 01/15/2022   Multiple rib fractures 11/15/2021   Pneumothorax 11/15/2021   Depression with anxiety 11/05/2021   Chronic diastolic CHF (congestive heart failure) ([place]) 11/05/2021   Chronic kidney disease, stage 3a ([place]) 11/05/2021   Boil of lower extremity-left ankle 11/05/2021   Alcohol use 11/05/2021   Hypomagnesemia 11/05/2021   Ankle wound, left, subsequent encounter 08/19/2021   Hyperparathyroidism ([place]) 02/17/2021   Slow heart beat 09/22/2020   CHF (congestive heart failure) ([place]) 09/19/2020   Troponin level elevated 09/18/2020    Itching 09/05/2020   Coagulation defect ([place]) 08/12/2020   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   Chronic neck pain 04/15/2020   Chronic kidney disease, stage 3, mod decreased GFR ([place]) 04/05/2020   DOE (dyspnea on exertion) 02/09/2020   Anxiety 01/23/2020   Gout 05/18/2019   Nodule of finger, bilateral 03/22/2019   Clavi 03/09/2019   Chronic bilateral low back pain without sciatica 12/31/2017   Hypokalemia 09/29/2017   Atrial fibrillation ([place]) 09/10/2017   Depression, major, single episode, mild ([place]) 09/10/2017   Chronic venous insufficiency 09/09/2017   Lymphedema 09/09/2017   Dry eyes 07/06/2017   Pain and swelling of left ankle 03/29/2017   Fall 05/01/2016   Leg weakness, bilateral 12/30/2015   Anemia of chronic disease 05/19/2013   Psoriatic arthritis ([place]) 02/03/2013   Psoriasis    Osteoarthritis, multiple sites    History of breast cancer 10/28/2011   GERD (gastroesophageal reflux disease)    HTN (hypertension)    Osteoporosis    Carotid stenosis 05/27/2011   Occlusion and stenosis of carotid artery without mention of cerebral infarction 05/27/2011    Orientation RESPIRATION BLADDER Height & Weight     Self, Time, Situation, Place  O2 (11 L Venturi Mask) External catheter Weight: 62.7 kg (will check a standing weight if pt able to get OOB later) Height:     BEHAVIORAL SYMPTOMS/MOOD NEUROLOGICAL BOWEL NUTRITION STATUS  Other (Comment) (n/a)  (n/a) Continent Diet  AMBULATORY STATUS COMMUNICATION OF NEEDS Skin   Limited Assist Verbally Skin abrasions, Bruising  Personal Care Assistance Level of Assistance  Bathing, Dressing Bathing Assistance: Limited assistance   Dressing Assistance: Limited assistance     Functional Limitations Info  Sight Sight Info: Impaired        SPECIAL CARE FACTORS FREQUENCY  PT (By licensed PT), OT (By licensed OT)     PT Frequency: Min 2x weekly OT Frequency: Min 2x  weekly            Contractures Contractures Info: Not present    Additional Factors Info  Code Status, Allergies Code Status Info: FULL Allergies Info: No Known Allergies           Current Medications (08/19/2022):  This is the current hospital active medication list Current Facility-Administered Medications  Medication Dose Route Frequency Provider Last Rate Last Admin   acetaminophen (TYLENOL) tablet 1,000 mg  1,000 mg Oral Q6H Piscoya, Jose, MD   1,000 mg at 08/17/22 0823   allopurinol (ZYLOPRIM) tablet 150 mg  150 mg Oral Daily Mansy, Jan A, MD   150 mg at 08/18/22 8119   amiodarone (PACERONE) tablet 200 mg  200 mg Oral BID Mansy, Jan A, MD   200 mg at 08/18/22 2132   cefTRIAXone (ROCEPHIN) 1 g in sodium chloride 0.9 % 100 mL IVPB  1 g Intravenous Q24H Nicole Kindred A, DO   Stopped at 08/18/22 1205   cholecalciferol (VITAMIN D3) 25 MCG (1000 UNIT) tablet 2,000 Units  2,000 Units Oral Daily Mansy, Jan A, MD   2,000 Units at 14/78/29 5621   folic acid (FOLVITE) tablet 1 mg  1 mg Oral Daily Ivor Costa, MD   1 mg at 08/18/22 0859   gabapentin (NEURONTIN) capsule 100 mg  100 mg Oral TID Olean Ree, MD   100 mg at 08/18/22 2133   guaiFENesin-dextromethorphan (ROBITUSSIN DM) 100-10 MG/5ML syrup 5 mL  5 mL Oral Q4H PRN Nicole Kindred A, DO       lidocaine (LIDODERM) 5 % 1 patch  1 patch Transdermal Q24H Ivor Costa, MD   1 patch at 08/18/22 0901   LORazepam (ATIVAN) 2 MG/ML concentrated solution 0.5 mg  0.5 mg Oral Q4H PRN Ivor Costa, MD       LORazepam (ATIVAN) tablet 0.5 mg  0.5 mg Oral BID PRN Mansy, Jan A, MD   0.5 mg at 08/14/22 2008   magnesium hydroxide (MILK OF MAGNESIA) suspension 30 mL  30 mL Oral Daily PRN Mansy, Jan A, MD       methocarbamol (ROBAXIN) tablet 750 mg  750 mg Oral Q8H Griffith, Kelly A, DO   750 mg at 08/18/22 2132   mirtazapine (REMERON) tablet 30 mg  30 mg Oral QHS Mansy, Jan A, MD   30 mg at 08/18/22 2132   morphine (PF) 2 MG/ML injection 1 mg  1 mg  Intravenous Q4H PRN Ivor Costa, MD   1 mg at 08/18/22 2132   multivitamin with minerals tablet 1 tablet  1 tablet Oral Daily Mansy, Jan A, MD   1 tablet at 08/18/22 0902   ondansetron [facility]) tablet 4 mg  4 mg Oral Q6H PRN Mansy, Jan A, MD       Or   ondansetron [facility]) injection 4 mg  4 mg Intravenous Q6H PRN Mansy, Jan A, MD       oxyCODONE (Oxy IR/ROXICODONE) immediate release tablet 5-10 mg  5-10 mg Oral Q4H PRN Piscoya, Jose, MD   10 mg at 08/18/22 1125   thiamine (VITAMIN B1) tablet 100 mg  100 mg Oral Daily Ivor Costa, MD   100 mg at 08/18/22 7614   Or   thiamine (VITAMIN B1) injection 100 mg  100 mg Intravenous Daily Ivor Costa, MD       traZODone (DESYREL) tablet 25 mg  25 mg Oral QHS PRN Mansy, Jan A, MD   25 mg at 08/15/22 2127     Discharge Medications: Please see discharge summary for a list of discharge medications.  Relevant Imaging Results:  Relevant Lab Results:   Additional Information SS# [id number]  Laurena Slimmer, RN

## 2022-08-20 DIAGNOSIS — Z7189 Other specified counseling: Secondary | ICD-10-CM

## 2022-08-20 DIAGNOSIS — J9601 Acute respiratory failure with hypoxia: Secondary | ICD-10-CM | POA: Diagnosis not present

## 2022-08-20 MED ORDER — FUROSEMIDE 10 MG/ML IJ SOLN
40.0000 mg | Freq: Once | INTRAMUSCULAR | Status: AC
  Administered 2022-08-20: 40 mg via INTRAVENOUS
  Filled 2022-08-20: qty 4

## 2022-08-20 MED ORDER — ENOXAPARIN SODIUM 40 MG/0.4ML IJ SOSY
40.0000 mg | PREFILLED_SYRINGE | INTRAMUSCULAR | Status: DC
  Administered 2022-08-20 – 2022-08-24 (×5): 40 mg via SUBCUTANEOUS
  Filled 2022-08-20 (×5): qty 0.4

## 2022-08-20 MED ORDER — MAGNESIUM OXIDE -MG SUPPLEMENT 400 (240 MG) MG PO TABS
400.0000 mg | ORAL_TABLET | Freq: Two times a day (BID) | ORAL | Status: DC
  Administered 2022-08-20 – 2022-08-21 (×3): 400 mg via ORAL
  Filled 2022-08-20 (×3): qty 1

## 2022-08-20 MED ORDER — FUROSEMIDE 10 MG/ML IJ SOLN
40.0000 mg | Freq: Two times a day (BID) | INTRAMUSCULAR | Status: DC
  Administered 2022-08-20 – 2022-08-21 (×3): 40 mg via INTRAVENOUS
  Filled 2022-08-20 (×3): qty 4

## 2022-08-20 MED ORDER — POTASSIUM CHLORIDE 20 MEQ PO PACK
20.0000 meq | PACK | Freq: Two times a day (BID) | ORAL | Status: DC
  Administered 2022-08-20 – 2022-08-21 (×3): 20 meq via ORAL
  Filled 2022-08-20 (×3): qty 1

## 2022-08-20 NOTE — Plan of Care (Signed)

## 2022-08-20 NOTE — TOC Progression Note (Addendum)
Transition of Care [facility]) - Progression Note    Patient Details  Name: [name] MRN: [id number] Date of Birth: 08/15/1940  Transition of Care [facility] CM/SW Contact  Laurena Slimmer, RN Phone Number: 08/20/2022, 12:04 PM  Clinical Narrative:    Spoke with patient's daughter, Amy. She stated she had spoken with Magda Paganini at [facility] and would like patient's referral sent there. Amy advised patient referral was initially sent to [facility] and was declined.  Contacted Rachel Bo at [facility] to discuss referral. Bed offer still declined  due to history of ETOH abuse. Contacted patient's daughter Amy to  advised bed offer at [facility] was still declined and reason being related to alcoholism. Amy stated she was able to [facility]. She is not agreeable to [facility]. Her brother will [place] and [facility] today.         Expected Discharge Plan and Services                                               Social Determinants of Health (SDOH) Interventions SDOH Screenings   Food Insecurity: No Food Insecurity (02/13/2022)  Housing: Low Risk  (02/13/2022)  Transportation Needs: No Transportation Needs (02/13/2022)  Depression (PHQ2-9): Low Risk  (08/04/2022)  Financial Resource Strain: Low Risk  (02/13/2022)  Physical Activity: Unknown (02/10/2021)  Social Connections: Unknown (02/13/2022)  Stress: No Stress Concern Present (02/13/2022)  Tobacco Use: Medium Risk (08/17/2022)    Readmission Risk Interventions     No data to display

## 2022-08-20 NOTE — Progress Notes (Signed)
PROGRESS NOTE  CORYNN SOLBERG KZL:[id number] DOB: July 17, 1941 DOA: 08/14/2022 PCP: [name], MD  HPI/Recap of past 24 hours: [name] is a 82 y.o. female with medical history significant of permanent A-fib on Eliquis, alcohol abuse, hypertension, CKD-3a, hyperlipidemia, diastolic CHF, gout, depression with anxiety, MGUS, psoriasis, left breast cancer, anemia, who presented on 08/14/2022 for evaluation after a mechanical fall and subsequent severe right rib cage pain.  She denied hitting head or LOC, reportedly declined CT's of head and neck.   ED Course: pt was found to have hemoglobin 10.7 (12.4 on 08/14/2022), stable renal function, afebrile and stable vitals with BP low/normal.     Imaging revealed right-sided lateral rib fractures of ribs 5-8, a small right apical pneumothorax (5% volume), right base opacity (atelectasis vs PNA), COPD chronic changes.  Also has chronic left-sided rib fractures.  Moderate left-side pleural effusion.  CT chest/abdomen/pelvis showed small volume right hemo-pneumothorax and pulmonary contusions. Rib fractures noted 3-10, with 5th rib fractured in two places.  Also small T7-T9 transverse process fractures.     Patient was admitted to medicine service with surgery consulted.   No chest tube has been required thus far, and no indication for surgical intervention.     1/27: repeat CXR, stable R apical PTX of ~5%, increased right pleural fluid. 1/29: no residual PTX on CXR today.  Stable but pain remains uncontrolled.   1/30: increased O2 needs overnight.  Started antibiotic, given a dose IV Lasix.  Suspect some fluid overload and ?developing pneumonia. 1/31: ongoing work of breathing, tachypnea, on NRB mask borderline/low sats with minimal exertion. Further diuresis.  08/20/2022: Patient was seen and examined at bedside.  On nonrebreather.  Personally reviewed chest x-ray done on 08/19/2022 which showed bilateral pleural effusions with bibasilar  opacities right greater than left, concerning for underlying pneumonia.  Assessment/Plan: Principal Problem:   Multiple rib fractures Active Problems:   Hypokalemia   Hemopneumothorax   Lobar pneumonia (HCC)   Acute respiratory failure with hypoxia (HCC)   Bilateral pleural effusion   HTN (hypertension)   Chronic diastolic CHF (congestive heart failure) (HCC)   Chronic kidney disease, stage 3a (HCC)   Macrocytic anemia   AKI (acute kidney injury) ([place])   Gout   Depression with anxiety   Alcohol use   Atrial fibrillation, chronic (HCC)   Blunt injury to chest   Fall   Underweight   Pressure injury of skin  Multiple rib fractures See imaging reports.  On CT scan, right ribs 3 through 10 are fractured, with 5th rib two fractures.  Also T7-T9 transverse process fractures. --Pain control - scheduled Tylenol and PRN oxycodone, PRN morphine for breakthrough --Continue scheduled Robaxin --Incentive spirometry --PT/OT --Lidocaine patch --Serial CXR's  Acute on chronic diastolic CHF BNP 390 Chest x-ray with bilateral pleural effusions Next 2D echo with normal LVEF Repeat 2D echo on 08/20/2022. Strict I's and O's and daily weight Net I&O -1.5 L.  Bilateral pleural effusion Small left, moderate right. IV Lasix on 08/20/2018 420 mg x 1. Maintain O2 saturation greater than 92%.   Acute respiratory failure with hypoxia (HCC) Due to rib fractures initially, now seem to be developing PNA and ?fluid overload with pleural effusions.  She did get couple days gentle fluids for AKI and now increased WOB and O2 needs. 1/30: O2 needs up overnight, this AM on 12 L/min NRB mask with O2 sat low 90's at rest. --Supplement O2, goal sats 90-94% --Wean as tolerated --Mgmt of  underlying issues as outlined --Incentive spirometry   Lobar pneumonia (HCC) Pt likely developing a pneumonia secondary to multiple rib fractures and very poor inspirations.  Continue Rocephin --Supportive care per  orders --O2 per protocol   Hemopneumothorax Surgery following. Due to multiple rib fractures. Hold Eliquis Monitor Hbg Monitor respiratory status closely Serial CXR's   Resolved, post repletion, hypokalemia K replaced on admission. Monitor & replace PRN   AKI (acute kidney injury) ([place]) On baseline CKD IIIa. Cr increased to 1.51   Cr normalized w / IV hydration. Last Cr 0.73 --Monitor BMP --Caution with diuretics   Macrocytic anemia Hbg 12.4 on 08/14/22, on admission, Hbg was 10.7 >> 10.5 >> nadir 9.7 >> 9.9 >> 10.2 ...10.7 Monitor Hbg closely with hemopneumothorax. Hold Eliquis for now.     Chronic kidney disease, stage 3a ([place]) Renal function currently normal. Monitor BMP.   Chronic diastolic CHF (congestive heart failure) (HCC) Last echo June 2023 EF 55-60%. Home Lasix held on admission with soft BP's and AKI requiring fluids. Now cautiously diuresing due to signs of volume overload, increased respiratory distress. 1/30 - IV Lasix 20 mg x 1 1/31 - Further IV Lasix today  --Strict I/O's & daily weights --Monitor renal function, electrolytes   HTN (hypertension) Softer BP's.   Hold home Lasix, amlodipine Getting IV Lasix   Blunt injury to chest Due to mechanical fall   Atrial fibrillation, chronic (HCC) HR 50's to 60's. Continue amiodarone Hold Eliquis due to hemopneumothorax   Alcohol use No withdrawal signs.  D/C CIWA orders.   Depression with anxiety Continue home regimen   Gout Continue allopurinol   Fall With multiple R rib fractures and hemopneumothorax.  Pt declined CT head or neck on admission and denied striking her head or LOC. --PT/OT evaluations --SNF recommended --TOC following for placement --Fall precautions   Underweight Body mass index is 17.96 kg/m.     Pressure injury of skin Pressure Injury 08/14/22 Vertebral column Lower;Medial Stage 1 -  Intact skin with non-blanchable redness of a localized area usually over a bony  prominence. (Active)  08/14/22 1552  Location: Vertebral column  Location Orientation: Lower;Medial  Staging: Stage 1 -  Intact skin with non-blanchable redness of a localized area usually over a bony prominence.  Wound Description (Comments):   Present on Admission: Yes                Code Status: Full code  Family Communication: Updated her son at bedside  Disposition Plan: Plan is to discharge to SNF   Consultants: None.   DVT prophylaxis: Subcu Lovenox daily  Status is: Inpatient The patient requires at least 2 midnights for further evaluation and treatment of present condition.    Objective: Vitals:   08/20/22 0846 08/20/22 1100 08/20/22 1131 08/20/22 1353  BP:   128/64   Pulse:   77   Resp:   17   Temp:   99 F (37.2 C)   TempSrc:   Axillary   SpO2:  93% 93% 95%  Weight: 58.8 kg       Intake/Output Summary (Last 24 hours) at 08/20/2022 1514 Last data filed at 08/20/2022 1321 Gross per 24 hour  Intake 460 ml  Output 1100 ml  Net -640 ml   Filed Weights   08/18/22 0914 08/19/22 0814 08/20/22 0846  Weight: 62.7 kg 63 kg 58.8 kg    Exam:  General: 82 y.o. year-old female well developed well nourished in no acute distress.  Alert and oriented  x3. Cardiovascular: Regular rate and rhythm with no rubs or gallops.  No thyromegaly or JVD noted.   Respiratory: Clear to auscultation with no wheezes or rales. Good inspiratory effort. Abdomen: Soft nontender nondistended with normal bowel sounds x4 quadrants. Musculoskeletal: No lower extremity edema. 2/4 pulses in all 4 extremities. Skin: No ulcerative lesions noted or rashes, Psychiatry: Mood is appropriate for condition and setting   Data Reviewed: CBC: Recent Labs  Lab 08/14/22 0231 08/14/22 1148 08/15/22 0137 08/16/22 0557 08/16/22 1613 08/17/22 0502 08/18/22 0438 08/19/22 0213  WBC 9.1  --  8.6 8.2  --  9.4 9.6 10.0  NEUTROABS 6.8  --   --   --   --   --   --   --   HGB 10.7*   < > 10.4*  9.7* 9.9* 10.2* 10.0* 10.7*  HCT 32.4*   < > 32.5* 30.6* 31.5* 32.4* 31.4* 32.3*  MCV 105.9*  --  107.6* 108.5*  --  109.1* 107.5* 104.9*  PLT 230  --  208 185  --  209 254 268   < > = values in this interval not displayed.   Basic Metabolic Panel: Recent Labs  Lab 08/15/22 0137 08/16/22 0557 08/17/22 0502 08/18/22 0438 08/19/22 0213  NA 137 135 137 135 136  K 4.5 4.1 4.8 4.1 3.6  CL 103 107 105 108 103  CO2 '26 24 22 23 24  '$ GLUCOSE 99 98 110* 107* 100*  BUN 27* 40* 35* 24* 18  CREATININE 1.25* 1.51* 1.23* 0.82 0.73  CALCIUM 9.0 8.6* 9.0 8.6* 9.1  MG 1.8  --  2.0  --   --    GFR: Estimated Creatinine Clearance: 49.5 mL/min (by C-G formula based on SCr of 0.73 mg/dL). Liver Function Tests: Recent Labs  Lab 08/14/22 0231  AST 53*  ALT 28  ALKPHOS 66  BILITOT 0.5  PROT 6.8  ALBUMIN 3.5   No results for input(s): "LIPASE", "AMYLASE" in the last 168 hours. No results for input(s): "AMMONIA" in the last 168 hours. Coagulation Profile: Recent Labs  Lab 08/14/22 1148  INR 1.2   Cardiac Enzymes: No results for input(s): "CKTOTAL", "CKMB", "CKMBINDEX", "TROPONINI" in the last 168 hours. BNP (last 3 results) Recent Labs    02/27/22 1106  PROBNP 420.0*   HbA1C: No results for input(s): "HGBA1C" in the last 72 hours. CBG: No results for input(s): "GLUCAP" in the last 168 hours. Lipid Profile: No results for input(s): "CHOL", "HDL", "LDLCALC", "TRIG", "CHOLHDL", "LDLDIRECT" in the last 72 hours. Thyroid Function Tests: No results for input(s): "TSH", "T4TOTAL", "FREET4", "T3FREE", "THYROIDAB" in the last 72 hours. Anemia Panel: No results for input(s): "VITAMINB12", "FOLATE", "FERRITIN", "TIBC", "IRON", "RETICCTPCT" in the last 72 hours. Urine analysis: No results found for: "COLORURINE", "APPEARANCEUR", "LABSPEC", "PHURINE", "GLUCOSEU", "HGBUR", "BILIRUBINUR", "KETONESUR", "PROTEINUR", "UROBILINOGEN", "NITRITE", "LEUKOCYTESUR" Sepsis  Labs: '@LABRCNTIP'$ (procalcitonin:4,lacticidven:4)  )No results found for this or any previous visit (from the past 240 hour(s)).    Studies: No results found.  Scheduled Meds:  acetaminophen  1,000 mg Oral Q6H   allopurinol  150 mg Oral Daily   amiodarone  200 mg Oral BID   cholecalciferol  2,000 Units Oral Daily   folic acid  1 mg Oral Daily   gabapentin  100 mg Oral TID   lidocaine  1 patch Transdermal Q24H   methocarbamol  750 mg Oral Q8H   mirtazapine  30 mg Oral QHS   multivitamin with minerals  1 tablet Oral Daily   thiamine  100 mg Oral Daily   Or   thiamine  100 mg Intravenous Daily    Continuous Infusions:  cefTRIAXone (ROCEPHIN)  IV 1 g (08/20/22 0951)     LOS: 6 days     Kayleen Memos, MD [facility] Pager [phone]  If 7PM-7AM, please contact night-coverage [email or web address] Password [facility] 08/20/2022, 3:14 PM

## 2022-08-20 NOTE — Consult Note (Addendum)
Consultation Note Date: 08/20/2022   Patient Name: [name]  DOB: June 18, 1941  MRN: [id number]  Age / Sex: 82 y.o., female  PCP: [name], MD Referring Physician: Kayleen Memos, DO  Reason for Consultation: Establishing goals of care  HPI/Patient Profile: [name] is a 82 y.o. female with medical history significant of A-fib on Eliquis, alcohol abuse, hypertension, CKD-3a, hyperlipidemia, diastolic CHF, gout, depression with anxiety, MGUS, psoriasis, left breast cancer, anemia, who presented on 08/14/2022 for evaluation after a mechanical fall and subsequent severe right rib cage pain.  She denied hitting head or LOC, reportedly declined CT's of head and neck.   ED Course: pt was found to have hemoglobin 10.7 (12.4 on 08/14/2022), stable renal function, afebrile and stable vitals with BP low/normal.     Imaging revealed right-sided lateral rib fractures of ribs 3, 5-8, 10 a small right apical pneumothorax, hemothorax, bilateral opacities.  Also has chronic left-sided rib fractures.  Moderate left-side pleural effusion.   Clinical Assessment and Goals of Care: Notes, labs, diagnostics reviewed.  Into see patient.  She is currently resting in bed in a semiprivate room on high flow cannula with her daughter at bedside and her son [name] on the phone.  Due to noise and traffic of being in a semiprivate room, closest to the door, I offered to have the goals of care conversation completed tomorrow with hopes patient would have a private room, but family states that they would like to continue with the conversation now.  They advised that one of her daughters Amy is going to to her rehab facilities.  They advise Amy is H POA if H POA is needed.  They discussed at baseline the patient is extremely active and enjoys life.  She enjoys arts, and culture, and spending time with friends.  We discussed  her diagnoses, prognosis, GOC, EOL wishes disposition and options.  Created space and opportunity for patient  to explore thoughts and feelings regarding current medical information.   A detailed discussion was had today regarding advanced directives.  Concepts specific to code status, artifical feeding and hydration, IV antibiotics were discussed.  Values and goals of care important to patient and family were attempted to be elicited.  Discussed limitations of medical interventions to prolong quality of life in some situations and discussed the concept of human mortality. 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   She discusses that she has pressure in her ribs.  She states it does not feel like pain, but pressure.  She uses the IS while I am at bedside and is only able to pull to about 250 with multiple attempts.  Discussed in detail the purpose of IS and the need for family to encourage her to take good deep breaths in order to get this number higher.  Discussed the importance of symptom management, and balancing that with not over sedating her.  In regards to boundaries and care, patient discusses that she  already has a DNR. She states that she would not want to be put on the ventilator and would not want CPR.  They discuss that they would be amenable to trying BiPAP if needed, though she states she is claustrophobic and would need medication for her to be able to tolerate BiPAP mask.   They discussed that at this time they are hopeful for her to be able to wean from high flow cannula so that she can go to rehab.  Discussed changing her CODE STATUS to DNR based on her wishes, but she and the 2 children that are part of the conversation state that they need to review this with Amy prior to formally making changes to [place].       SUMMARY OF RECOMMENDATIONS   Patient and family state patient has a DNR/DNI status.  Offered to change this for them  as patient currently is full code/ full scope but patient and children state they do not want to make changes to the CODE STATUS until they have had a chance to speak with patient's other child Amy who is H POA first.  Agree with current pain regimen, would recommend treating pain with available medications as needed based on patient's symptoms.  Encouraged patient and family to use incentive spirometer 6 to 10 breaths every hour during the time she is awake.  Primary team could consider MetaNebs.  Prognosis:  Unable to determine      Primary Diagnoses: Present on Admission:  Multiple rib fractures  Hemopneumothorax  HTN (hypertension)  Fall  Gout  Chronic kidney disease, stage 3a ([place])  Alcohol use  Atrial fibrillation, chronic (HCC)  Chronic diastolic CHF (congestive heart failure) (HCC)  Depression with anxiety  Macrocytic anemia  Hypokalemia  Underweight  Pressure injury of skin  Blunt injury to chest  Acute respiratory failure with hypoxia (HCC)  Bilateral pleural effusion   I have reviewed the medical record, interviewed the patient and family, and examined the patient. The following aspects are pertinent.  Past Medical History:  Diagnosis Date   (HFpEF) heart failure with preserved ejection fraction ([place])    a. 08/2017 Echo: EF 55-60%, no rwma, mild MR, mildly dil LA, nl RV fxn; b. 03/2020 Echo: EF 60-65%, no rwma, Gr2 DD. RVSP 44.55mHg. Mod dil LA. Mild MR; c. 09/2020 Echo: EF 50-55%, no rwma, mild LVH, mod red RV fxn, mildly dily RA.   Acute CHF (congestive heart failure) ([facility] 09/18/2020   Alcohol abuse 04/05/2020   Breast cancer ([facility] 2001   left breast   Cancer ([facility] 2001   left breast ca   Carotid arterial disease ([facility]    a. 10/2004 s/p L CEA; b. 12/2015 Carotid U/S: RICA 1-39%; b. LICA patent CEA site.   Closed right hip fracture ([facility] 09/20/2019   GERD (gastroesophageal reflux disease)    Hyperlipidemia    Hypertension    Osteoarthritis, multiple sites     Osteopenia    Persistent atrial fibrillation ([facility]    a. Dx 08/2017; b. CHA2DS2VASc = 6-->Pradaxa; c. 09/2017 Successful DCCV (second shock - 200J); d. 10/2017 Recurrent Afib-->flecainide started 11/2017; e. 06/2020 s/p DCCV (200J x 1); f. 06/2020 Recurrent AF->Flec inc 75bid; g. 09/2020 WCT->flec d/c'd->amio started.   Personal history of radiation therapy 2001   left breast ca   Psoriasis    Wide-complex tachycardia    a. 09/2020 in setting of presumed Flecainide toxicity.  Flecainide d/c'd.   Social History   Socioeconomic History   Marital  status: Widowed    Spouse name: Not on file   Number of children: Not on file   Years of education: Not on file   Highest education level: Not on file  Occupational History   Occupation: Marketing    Comment: Retired  Tobacco Use   Smoking status: Former    Packs/day: 1.00    Years: 40.00    Total pack years: 40.00    Types: Cigarettes    Quit date: 07/21/1995    Years since quitting: 27.1   Smokeless tobacco: Never   Tobacco comments:    Former smoker 05/15/22  Vaping Use   Vaping Use: Never used  Substance and Sexual Activity   Alcohol use: Yes    Alcohol/week: 14.0 standard drinks of alcohol    Types: 7 Glasses of wine, 7 Standard drinks or equivalent per week    Comment: 1 glass of wine and 1 drink of liquor daily 05/15/22   Drug use: No   Sexual activity: Never  Other Topics Concern   Not on file  Social History Narrative   1 natural child   3 adopted children   Artist---still teaches water colors   Husband has Alzheimers      Has living will   Daughter Amy is health care POA   DNR    No tube feeds if cognitively unaware   Social Determinants of Health   Financial Resource Strain: Low Risk  (02/13/2022)   Overall Financial Resource Strain (CARDIA)    Difficulty of Paying Living Expenses: Not hard at all  Food Insecurity: No Food Insecurity (02/13/2022)   Hunger Vital Sign    Worried About Running Out of Food in the Last  Year: Never true    Ran Out of Food in the Last Year: Never true  Transportation Needs: No Transportation Needs (02/13/2022)   PRAPARE - Hydrologist (Medical): No    Lack of Transportation (Non-Medical): No  Physical Activity: Unknown (02/10/2021)   Exercise Vital Sign    Days of Exercise per Week: 0 days    Minutes of Exercise per Session: Not on file  Stress: No Stress Concern Present (02/13/2022)    [place]    Feeling of Stress : Not at all  Social Connections: Unknown (02/13/2022)   Social Connection and Isolation Panel [NHANES]    Frequency of Communication with Friends and Family: More than three times a week    Frequency of Social Gatherings with Friends and Family: More than three times a week    Attends Religious Services: Not on Advertising copywriter or Organizations: Not on file    Attends Archivist Meetings: Not on file    Marital Status: Not on file   Family History  Problem Relation Age of Onset   Hypertension Brother    Pneumonia Mother    Leukemia Father    Heart disease Neg Hx    Diabetes Neg Hx    Breast cancer Neg Hx    Scheduled Meds:  acetaminophen  1,000 mg Oral Q6H   allopurinol  150 mg Oral Daily   amiodarone  200 mg Oral BID   cholecalciferol  2,000 Units Oral Daily   enoxaparin (LOVENOX) injection  40 mg Subcutaneous [id number]   folic acid  1 mg Oral Daily   gabapentin  100 mg Oral TID   lidocaine  1 patch Transdermal Q24H   methocarbamol  750 mg Oral Q8H   mirtazapine  30 mg Oral QHS   multivitamin with minerals  1 tablet Oral Daily   thiamine  100 mg Oral Daily   Or   thiamine  100 mg Intravenous Daily   Continuous Infusions:  cefTRIAXone (ROCEPHIN)  IV 1 g (08/20/22 0951)   PRN Meds:.guaiFENesin-dextromethorphan, [EXPIRED] LORazepam **FOLLOWED BY** LORazepam, LORazepam, magnesium hydroxide, morphine injection, ondansetron **OR**  ondansetron (ZOFRAN) IV, oxyCODONE, traZODone Medications Prior to Admission:  Prior to Admission medications   Medication Sig Start Date End Date Taking? Authorizing Provider  acetaminophen (TYLENOL) 325 MG tablet Take 2 tablets (650 mg total) by mouth every 4 (four) hours as needed for headache or mild pain. 09/19/20  Yes Swayze, Ava, DO  allopurinol (ZYLOPRIM) 100 MG tablet TAKE 1.5 TABLETS (150 MG TOTAL) BY MOUTH DAILY 08/07/22  Yes [name], MD  amiodarone (PACERONE) 200 MG tablet Take 1 tablet (200 mg total) by mouth 2 (two) times daily. 07/27/22  Yes Fenton, Clint R, PA  amLODipine (NORVASC) 5 MG tablet Take 10 mg by mouth daily.   Yes [provider]  apixaban (ELIQUIS) 2.5 MG TABS tablet TAKE 1 TABLET BY MOUTH TWICE A DAY 05/13/22  Yes Vickie Epley, MD  cholecalciferol (VITAMIN D) 25 MCG (1000 UNIT) tablet Take 2 tablets (2,000 Units total) by mouth daily. 09/20/20  Yes Swayze, Ava, DO  furosemide (LASIX) 40 MG tablet TAKE 0.5 TABLETS (20 MG TOTAL) BY MOUTH EVERY OTHER DAY. 07/29/22  Yes [name], MD  LORazepam (ATIVAN) 0.5 MG tablet TAKE 1 TABLET BY MOUTH TWICE A DAY AS NEEDED FOR ANXIETY 06/09/22  Yes [name], MD  mirtazapine (REMERON) 30 MG tablet Take 1 tablet (30 mg total) by mouth at bedtime. 06/09/22  Yes [name], MD  Multiple Vitamin (MULTIVITAMIN) capsule Take 1 capsule by mouth daily.   Yes [provider]  Multiple Vitamins-Minerals (PRESERVISION AREDS 2+MULTI VIT PO) Take 1 capsule by mouth in the morning and at bedtime.   Yes [provider]  furosemide (LASIX) 20 MG tablet TAKE 1 TABLET BY MOUTH EVERY DAY Patient not taking: Reported on 08/14/2022 07/29/22   [name], MD   No Known Allergies Review of Systems  Respiratory:         Pressure with deep breath.    Physical Exam Pulmonary:     Comments: On high flow cannula Neurological:     Mental Status: She is alert.     Vital Signs: BP 128/64  (BP Location: Right Arm)   Pulse 77   Temp 99 F (37.2 C) (Axillary)   Resp 17   Wt 58.8 kg   SpO2 95%   BMI 20.30 kg/m  Pain Scale: 0-10 POSS *See Group Information*: S-Acceptable,Sleep, easy to arouse Pain Score: Asleep   SpO2: SpO2: 95 % O2 Device:SpO2: 95 % O2 Flow Rate: .O2 Flow Rate (L/min): 50 L/min  IO: Intake/output summary:  Intake/Output Summary (Last 24 hours) at 08/20/2022 1642 Last data filed at 08/20/2022 1500 Gross per 24 hour  Intake 700 ml  Output 1100 ml  Net -400 ml    LBM: Last BM Date : 08/14/22 Baseline Weight: Weight: 52 kg Most recent weight: Weight: 58.8 kg      Signed by: Asencion Gowda, NP   Please contact Palliative Medicine Team phone at [phone] for questions and concerns.  For individual provider: See Shea Evans

## 2022-08-21 ENCOUNTER — Inpatient Hospital Stay: Payer: Medicare Other

## 2022-08-21 ENCOUNTER — Inpatient Hospital Stay (HOSPITAL_COMMUNITY)
Admit: 2022-08-21 | Discharge: 2022-08-21 | Disposition: A | Discharge status: Home / Self Care | Payer: Medicare Other | Attending: Internal Medicine | Admitting: Internal Medicine

## 2022-08-21 ENCOUNTER — Ambulatory Visit: Payer: Medicare Other | Admitting: Physician Assistant

## 2022-08-21 DIAGNOSIS — I5031 Acute diastolic (congestive) heart failure: Secondary | ICD-10-CM

## 2022-08-21 DIAGNOSIS — J9601 Acute respiratory failure with hypoxia: Secondary | ICD-10-CM | POA: Diagnosis not present

## 2022-08-21 LAB — ECHOCARDIOGRAM COMPLETE
AR max vel: 2.04 cm2
AV Area VTI: 2.34 cm2
AV Area mean vel: 2.02 cm2
AV Mean grad: 3 mmHg
AV Peak grad: 5.2 mmHg
Ao pk vel: 1.14 m/s
Area-P 1/2: 5.5 cm2
S' Lateral: 2.7 cm
Weight: 2074.09 oz

## 2022-08-21 LAB — CBC
HCT: 33.1 % — ABNORMAL LOW (ref 36.0–46.0)
Hemoglobin: 10.9 g/dL — ABNORMAL LOW (ref 12.0–15.0)
MCH: 34.4 pg — ABNORMAL HIGH (ref 26.0–34.0)
MCHC: 32.9 g/dL (ref 30.0–36.0)
MCV: 104.4 fL — ABNORMAL HIGH (ref 80.0–100.0)
Platelets: 327 10*3/uL (ref 150–400)
RBC: 3.17 MIL/uL — ABNORMAL LOW (ref 3.87–5.11)
RDW: 13.5 % (ref 11.5–15.5)
WBC: 8.7 10*3/uL (ref 4.0–10.5)
nRBC: 0 % (ref 0.0–0.2)

## 2022-08-21 LAB — BASIC METABOLIC PANEL
Anion gap: 11 (ref 5–15)
BUN: 21 mg/dL (ref 8–23)
CO2: 31 mmol/L (ref 22–32)
Calcium: 9 mg/dL (ref 8.9–10.3)
Chloride: 96 mmol/L — ABNORMAL LOW (ref 98–111)
Creatinine, Ser: 0.8 mg/dL (ref 0.44–1.00)
GFR, Estimated: >60 mL/min (ref >60)
Glucose, Bld: 89 mg/dL (ref 70–99)
Potassium: 3.3 mmol/L — ABNORMAL LOW (ref 3.5–5.1)
Sodium: 138 mmol/L (ref 135–145)

## 2022-08-21 LAB — MAGNESIUM: Magnesium: 1.5 mg/dL — ABNORMAL LOW (ref 1.7–2.4)

## 2022-08-21 MED ORDER — POTASSIUM CHLORIDE CRYS ER 20 MEQ PO TBCR
40.0000 meq | EXTENDED_RELEASE_TABLET | Freq: Once | ORAL | Status: AC
  Administered 2022-08-21: 40 meq via ORAL
  Filled 2022-08-21: qty 2

## 2022-08-21 MED ORDER — MAGNESIUM SULFATE 2 GM/50ML IV SOLN
2.0000 g | Freq: Once | INTRAVENOUS | Status: AC
  Administered 2022-08-21: 2 g via INTRAVENOUS
  Filled 2022-08-21: qty 50

## 2022-08-21 MED ORDER — FUROSEMIDE 10 MG/ML IJ SOLN
40.0000 mg | Freq: Two times a day (BID) | INTRAMUSCULAR | Status: AC
  Administered 2022-08-22 – 2022-08-23 (×4): 40 mg via INTRAVENOUS
  Filled 2022-08-21 (×4): qty 4

## 2022-08-21 NOTE — Progress Notes (Signed)
PROGRESS NOTE  [name] GMW:[id number] DOB: December 30, 1940 DOA: 08/14/2022 PCP: [name], MD  HPI/Recap of past 24 hours: [name] is a 82 y.o. female with medical history significant of permanent A-fib on Eliquis, alcohol abuse, hypertension, CKD-3a, hyperlipidemia, diastolic CHF, gout, depression with anxiety, MGUS, psoriasis, left breast cancer, anemia, who presented on 08/14/2022 for evaluation after a mechanical fall and subsequent severe right rib cage pain.  She denied hitting head or LOC, reportedly declined CT's of head and neck.   ED Course: pt was found to have hemoglobin 10.7 (12.4 on 08/14/2022), stable renal function, afebrile and stable vitals with BP low/normal.     Imaging revealed right-sided lateral rib fractures of ribs 5-8, a small right apical pneumothorax (5% volume), right base opacity (atelectasis vs PNA), COPD chronic changes.  Also has chronic left-sided rib fractures.  Moderate left-side pleural effusion.  CT chest/abdomen/pelvis showed small volume right hemo-pneumothorax and pulmonary contusions. Rib fractures noted 3-10, with 5th rib fractured in two places.  Also small T7-T9 transverse process fractures.     Patient was admitted to medicine service with surgery consulted.   No chest tube has been required thus far, and no indication for surgical intervention.     1/27: repeat CXR, stable R apical PTX of ~5%, increased right pleural fluid. 1/29: no residual PTX on CXR today.  Stable but pain remains uncontrolled.   1/30: increased O2 needs overnight.  Started antibiotic, given a dose IV Lasix.  Suspect some fluid overload and ?developing pneumonia. 1/31: ongoing work of breathing, tachypnea, on NRB mask borderline/low sats with minimal exertion. Further diuresis.  08/20/2022: On nonrebreather.  Personally reviewed chest x-ray done on 08/19/2022 which showed bilateral pleural effusions with bibasilar opacities right greater than left,  concerning for underlying pneumonia.  On Rocephin.  Started diuresing with IV Lasix.  08/21/2022: The patient was seen and examined at her bedside.  Family members were present in the room.  She has no new complaints.  Weaning off O2 supplementation.  Ongoing diuresing with IV Lasix 40 mg twice daily.  2D echo completed, LVEF 60 to 65%.  Assessment/Plan: Principal Problem:   Multiple rib fractures Active Problems:   Hypokalemia   Hemopneumothorax   Lobar pneumonia (HCC)   Acute respiratory failure with hypoxia (HCC)   Bilateral pleural effusion   HTN (hypertension)   Chronic diastolic CHF (congestive heart failure) (HCC)   Chronic kidney disease, stage 3a (HCC)   Macrocytic anemia   AKI (acute kidney injury) ([place])   Gout   Depression with anxiety   Alcohol use   Atrial fibrillation, chronic (HCC)   Blunt injury to chest   Fall   Underweight   Pressure injury of skin  Multiple rib fractures See imaging reports.  On CT scan, right ribs 3 through 10 are fractured, with 5th rib two fractures.  Also T7-T9 transverse process fractures. --Pain control - scheduled Tylenol and PRN oxycodone, PRN morphine for breakthrough Encourage use of incentive spirometer.  Acute respiratory failure with hypoxia (HCC) Currently on HHFNC, improving, weaning of O2 supplementation as tolerated Continue IV diuresing Incentive spirometer Maintain O2 saturation greater than 92%  Acute on chronic diastolic CHF BNP 440 Chest x-ray with bilateral pleural effusions 2D echo with normal LVEF Continue strict I's and O's and daily weight And I&O -2.2 L.  Bilateral pleural effusion IV diuresing Incentive spirometer Mobilize as tolerated   Lobar pneumonia (HCC) Pt likely developing a pneumonia secondary to multiple rib fractures  and very poor inspirations.  Continue Rocephin, day 4/5.   Hemopneumothorax Surgery following. Due to multiple rib fractures. Home Eliquis on hold.  Hemoglobin  stable.  Resolved, post repletion, hypokalemia   AKI (acute kidney injury) ([place]) Resolved Avoid nephrotoxic agents and hypotension   Macrocytic anemia Hbg 12.4 on 08/14/22, on admission Hemoglobin uptrending 10.9 from 10.7.   HTN (hypertension) BPs are at goal. Continue to monitor vital signs  Blunt injury to chest Due to mechanical fall   Atrial fibrillation, chronic (HCC) Rate controlled on amiodarone Hold Eliquis due to hemopneumothorax   Alcohol use disorder No withdrawal signs.  D/C CIWA orders.   Depression with anxiety Continue home regimen   Gout Continue allopurinol   Fall With multiple R rib fractures and hemopneumothorax.  Pt declined CT head or neck on admission and denied striking her head or LOC. --PT/OT evaluations, continue --SNF recommended --TOC following for placement --Fall precautions, continue   Underweight Body mass index is 17.96 kg/m. Encourage oral protein calorie intake   Critical care time: 65 minutes.     Pressure injury of skin Pressure Injury 08/14/22 Vertebral column Lower;Medial Stage 1 -  Intact skin with non-blanchable redness of a localized area usually over a bony prominence. (Active)  08/14/22 1552  Location: Vertebral column  Location Orientation: Lower;Medial  Staging: Stage 1 -  Intact skin with non-blanchable redness of a localized area usually over a bony prominence.  Wound Description (Comments):   Present on Admission: Yes                Code Status: Full code  Family Communication: Updated family members at bedside on 08/20/2022 and 08/21/22  Disposition Plan: Plan is to discharge to SNF   Consultants: None.   DVT prophylaxis: Subcu Lovenox daily  Status is: Inpatient The patient requires at least 2 midnights for further evaluation and treatment of present condition.    Objective: Vitals:   08/21/22 0834 08/21/22 1317 08/21/22 1355 08/21/22 1507  BP: (!) 164/74 (!) 124/50  116/65  Pulse: 76  63  60  Resp: (!) '22 16  16  '$ Temp: 98.5 F (36.9 C) 97.6 F (36.4 C)  98.2 F (36.8 C)  TempSrc: Oral Oral    SpO2: 95% 98% 99% 98%  Weight:        Intake/Output Summary (Last 24 hours) at 08/21/2022 1749 Last data filed at 08/21/2022 1519 Gross per 24 hour  Intake 1060 ml  Output 1850 ml  Net -790 ml   Filed Weights   08/18/22 0914 08/19/22 0814 08/20/22 0846  Weight: 62.7 kg 63 kg 58.8 kg    Exam:  General: 82 y.o. year-old female frail-appearing in no acute distress.  She is alert and oriented x 3. Cardiovascular: Regular rate and rhythm no rubs or gallops.  Respiratory: Mild rales at bases no wheezing noted.  t. Abdomen: Soft noted normal bowel sounds present.  Musculoskeletal: No lower extremity edema bilaterally.  Skin: No ulcerative lesions noted or rashes, Psychiatry: Mood is appropriate for condition and setting.   Data Reviewed: CBC: Recent Labs  Lab 08/16/22 0557 08/16/22 1613 08/17/22 0502 08/18/22 0438 08/19/22 0213 08/21/22 0404  WBC 8.2  --  9.4 9.6 10.0 8.7  HGB 9.7* 9.9* 10.2* 10.0* 10.7* 10.9*  HCT 30.6* 31.5* 32.4* 31.4* 32.3* 33.1*  MCV 108.5*  --  109.1* 107.5* 104.9* 104.4*  PLT 185  --  209 254 268 810   Basic Metabolic Panel: Recent Labs  Lab 08/15/22 0137 08/16/22  4081 08/17/22 0502 08/18/22 0438 08/19/22 0213 08/21/22 0404  NA 137 135 137 135 136 138  K 4.5 4.1 4.8 4.1 3.6 3.3*  CL 103 107 105 108 103 96*  CO2 '26 24 22 23 24 31  '$ GLUCOSE 99 98 110* 107* 100* 89  BUN 27* 40* 35* 24* 18 21  CREATININE 1.25* 1.51* 1.23* 0.82 0.73 0.80  CALCIUM 9.0 8.6* 9.0 8.6* 9.1 9.0  MG 1.8  --  2.0  --   --  1.5*   GFR: Estimated Creatinine Clearance: 49.5 mL/min (by C-G formula based on SCr of 0.8 mg/dL). Liver Function Tests: No results for input(s): "AST", "ALT", "ALKPHOS", "BILITOT", "PROT", "ALBUMIN" in the last 168 hours.  No results for input(s): "LIPASE", "AMYLASE" in the last 168 hours. No results for input(s): "AMMONIA" in the  last 168 hours. Coagulation Profile: No results for input(s): "INR", "PROTIME" in the last 168 hours.  Cardiac Enzymes: No results for input(s): "CKTOTAL", "CKMB", "CKMBINDEX", "TROPONINI" in the last 168 hours. BNP (last 3 results) Recent Labs    02/27/22 1106  PROBNP 420.0*   HbA1C: No results for input(s): "HGBA1C" in the last 72 hours. CBG: No results for input(s): "GLUCAP" in the last 168 hours. Lipid Profile: No results for input(s): "CHOL", "HDL", "LDLCALC", "TRIG", "CHOLHDL", "LDLDIRECT" in the last 72 hours. Thyroid Function Tests: No results for input(s): "TSH", "T4TOTAL", "FREET4", "T3FREE", "THYROIDAB" in the last 72 hours. Anemia Panel: No results for input(s): "VITAMINB12", "FOLATE", "FERRITIN", "TIBC", "IRON", "RETICCTPCT" in the last 72 hours. Urine analysis: No results found for: "COLORURINE", "APPEARANCEUR", "LABSPEC", "PHURINE", "GLUCOSEU", "HGBUR", "BILIRUBINUR", "KETONESUR", "PROTEINUR", "UROBILINOGEN", "NITRITE", "LEUKOCYTESUR" Sepsis Labs: '@LABRCNTIP'$ (procalcitonin:4,lacticidven:4)  )No results found for this or any previous visit (from the past 240 hour(s)).    Studies: ECHOCARDIOGRAM COMPLETE  Result Date: 08/21/2022    ECHOCARDIOGRAM REPORT   Patient Name:   [name] Date of Exam: 08/21/2022 Medical Rec #:  [id number]          Height:       67.0 in Accession #:    [id number]         Weight:       129.6 lb Date of Birth:  08-29-1940          BSA:          1.682 m Patient Age:    64 years           BP:           132/79 mmHg Patient Gender: F                  HR:           75 bpm. Exam Location:  [facility] Procedure: 2D Echo, Cardiac Doppler and Color Doppler Indications:     CHF-acute diastolic Y85.02  History:         Patient has prior history of Echocardiogram examinations, most                  recent 12/29/2021. CHF, Arrythmias:Atrial Fibrillation; Risk                  Factors:Hypertension.  Sonographer:     Sherrie Sport Referring Phys:  [id number] [place] Diagnosing Phys: Ida Rogue MD  Sonographer Comments: Suboptimal apical window. IMPRESSIONS  1. Left ventricular ejection fraction, by estimation, is 55 to 60%. The left ventricle has normal function. The left ventricle has no regional wall motion abnormalities. There is mild left  ventricular hypertrophy. Left ventricular diastolic parameters are indeterminate.  2. Right ventricular systolic function is normal. The right ventricular size is normal.  3. The mitral valve is normal in structure. Mild mitral valve regurgitation. No evidence of mitral stenosis.  4. The aortic valve is normal in structure. Aortic valve regurgitation is not visualized. No aortic stenosis is present.  5. The inferior vena cava is normal in size with greater than 50% respiratory variability, suggesting right atrial pressure of 3 mmHg. FINDINGS  Left Ventricle: Left ventricular ejection fraction, by estimation, is 55 to 60%. The left ventricle has normal function. The left ventricle has no regional wall motion abnormalities. The left ventricular internal cavity size was normal in size. There is  mild left ventricular hypertrophy. Left ventricular diastolic parameters are indeterminate. Right Ventricle: The right ventricular size is normal. No increase in right ventricular wall thickness. Right ventricular systolic function is normal. Left Atrium: Left atrial size was normal in size. Right Atrium: Right atrial size was normal in size. Pericardium: There is no evidence of pericardial effusion. Mitral Valve: The mitral valve is normal in structure. There is mild thickening of the mitral valve leaflet(s). Mild mitral valve regurgitation. No evidence of mitral valve stenosis. Tricuspid Valve: The tricuspid valve is normal in structure. Tricuspid valve regurgitation is mild . No evidence of tricuspid stenosis. Aortic Valve: The aortic valve is normal in structure. Aortic valve regurgitation is not visualized. No aortic stenosis is present.  Aortic valve mean gradient measures 3.0 mmHg. Aortic valve peak gradient measures 5.2 mmHg. Aortic valve area, by VTI measures 2.34 cm. Pulmonic Valve: The pulmonic valve was normal in structure. Pulmonic valve regurgitation is not visualized. No evidence of pulmonic stenosis. Aorta: The aortic root is normal in size and structure. Venous: The inferior vena cava is normal in size with greater than 50% respiratory variability, suggesting right atrial pressure of 3 mmHg. IAS/Shunts: No atrial level shunt detected by color flow Doppler.  LEFT VENTRICLE PLAX 2D LVIDd:         3.65 cm   Diastology LVIDs:         2.70 cm   LV e' medial:    6.96 cm/s LV PW:         1.00 cm   LV E/e' medial:  14.8 LV IVS:        1.00 cm   LV e' lateral:   10.80 cm/s LVOT diam:     1.90 cm   LV E/e' lateral: 9.5 LV SV:         48 LV SV Index:   29 LVOT Area:     2.84 cm  RIGHT VENTRICLE RV Basal diam:  3.30 cm RV Mid diam:    2.90 cm RV S prime:     12.90 cm/s TAPSE (M-mode): 2.2 cm LEFT ATRIUM             Index        RIGHT ATRIUM           Index LA diam:        3.10 cm 1.84 cm/m   RA Area:     17.00 cm LA Vol (A2C):   29.9 ml 17.78 ml/m  RA Volume:   46.40 ml  27.59 ml/m LA Vol (A4C):   36.6 ml 21.76 ml/m LA Biplane Vol: 36.8 ml 21.88 ml/m  AORTIC VALVE AV Area (Vmax):    2.04 cm AV Area (Vmean):   2.02 cm AV Area (VTI):  2.34 cm AV Vmax:           114.00 cm/s AV Vmean:          79.700 cm/s AV VTI:            0.206 m AV Peak Grad:      5.2 mmHg AV Mean Grad:      3.0 mmHg LVOT Vmax:         82.20 cm/s LVOT Vmean:        56.700 cm/s LVOT VTI:          0.170 m LVOT/AV VTI ratio: 0.83  AORTA Ao Root diam: 3.10 cm MITRAL VALVE                TRICUSPID VALVE MV Area (PHT): 5.50 cm     TR Peak grad:   14.3 mmHg MV Decel Time: 138 msec     TR Vmax:        189.00 cm/s MV E velocity: 103.00 cm/s MV A velocity: 50.30 cm/s   SHUNTS MV E/A ratio:  2.05         Systemic VTI:  0.17 m                             Systemic Diam: 1.90 cm  Ida Rogue MD Electronically signed by Ida Rogue MD Signature Date/Time: 08/21/2022/2:33:34 PM    Final     Scheduled Meds:  acetaminophen  1,000 mg Oral Q6H   allopurinol  150 mg Oral Daily   amiodarone  200 mg Oral BID   cholecalciferol  2,000 Units Oral Daily   enoxaparin (LOVENOX) injection  40 mg Subcutaneous Y70W   folic acid  1 mg Oral Daily   furosemide  40 mg Intravenous BID   gabapentin  100 mg Oral TID   lidocaine  1 patch Transdermal Q24H   magnesium oxide  400 mg Oral BID   methocarbamol  750 mg Oral Q8H   mirtazapine  30 mg Oral QHS   multivitamin with minerals  1 tablet Oral Daily   potassium chloride  20 mEq Oral BID   thiamine  100 mg Oral Daily   Or   thiamine  100 mg Intravenous Daily    Continuous Infusions:  cefTRIAXone (ROCEPHIN)  IV 1 g (08/21/22 1020)     LOS: 7 days     Kayleen Memos, MD [facility] Pager [phone]  If 7PM-7AM, please contact night-coverage [email or web address] Password [facility] 08/21/2022, 5:49 PM

## 2022-08-21 NOTE — TOC Progression Note (Signed)
Transition of Care [facility]) - Progression Note    Patient Details  Name: [name] MRN: [id number] Date of Birth: 1941-07-20  Transition of Care [facility] CM/SW Contact  Laurena Slimmer, RN Phone Number: 08/21/2022, 12:54 PM  Clinical Narrative:    Spoke with Amy, Patient's daughter. They have chosen [facility] for SNF.        Expected Discharge Plan and Services                                               Social Determinants of Health (SDOH) Interventions SDOH Screenings   Food Insecurity: No Food Insecurity (02/13/2022)  Housing: Low Risk  (02/13/2022)  Transportation Needs: No Transportation Needs (02/13/2022)  Depression (PHQ2-9): Low Risk  (08/04/2022)  Financial Resource Strain: Low Risk  (02/13/2022)  Physical Activity: Unknown (02/10/2021)  Social Connections: Unknown (02/13/2022)  Stress: No Stress Concern Present (02/13/2022)  Tobacco Use: Medium Risk (08/17/2022)    Readmission Risk Interventions     No data to display

## 2022-08-21 NOTE — Progress Notes (Signed)
PT Cancellation Note  Patient Details Name: [name] MRN: [id number] DOB: October 25, 1940   Cancelled Treatment:    Reason Eval/Treat Not Completed: Fatigue/lethargy limiting ability to participate. RN reports patient had been up in recliner and is now back in bed sleeping. Will re-attempt at later time/date.     Sylus Stgermain 08/21/2022, 2:41 PM

## 2022-08-21 NOTE — Progress Notes (Signed)
*  PRELIMINARY RESULTS* Echocardiogram 2D Echocardiogram has been performed.  [name] 08/21/2022, 7:48 AM

## 2022-08-22 LAB — BASIC METABOLIC PANEL
Anion gap: 10 (ref 5–15)
BUN: 23 mg/dL (ref 8–23)
CO2: 32 mmol/L (ref 22–32)
Calcium: 8.8 mg/dL — ABNORMAL LOW (ref 8.9–10.3)
Chloride: 94 mmol/L — ABNORMAL LOW (ref 98–111)
Creatinine, Ser: 0.88 mg/dL (ref 0.44–1.00)
GFR, Estimated: >60 mL/min (ref >60)
Glucose, Bld: 99 mg/dL (ref 70–99)
Potassium: 4.1 mmol/L (ref 3.5–5.1)
Sodium: 136 mmol/L (ref 135–145)

## 2022-08-22 LAB — MAGNESIUM: Magnesium: 1.8 mg/dL (ref 1.7–2.4)

## 2022-08-22 LAB — PHOSPHORUS: Phosphorus: 2.5 mg/dL (ref 2.5–4.6)

## 2022-08-22 MED ORDER — MAGNESIUM OXIDE -MG SUPPLEMENT 400 (240 MG) MG PO TABS
400.0000 mg | ORAL_TABLET | Freq: Two times a day (BID) | ORAL | Status: DC
  Administered 2022-08-22 – 2022-08-26 (×9): 400 mg via ORAL
  Filled 2022-08-22 (×9): qty 1

## 2022-08-22 MED ORDER — POTASSIUM CHLORIDE 20 MEQ PO PACK
20.0000 meq | PACK | Freq: Two times a day (BID) | ORAL | Status: DC
  Administered 2022-08-22 – 2022-08-25 (×7): 20 meq via ORAL
  Filled 2022-08-22 (×8): qty 1

## 2022-08-22 NOTE — Progress Notes (Signed)
Physical Therapy Treatment Patient Details Name: [name] MRN: [id number] DOB: 05-19-1941 Today's Date: 08/22/2022   History of Present Illness Pt is an 82 y.o. female presenting to hospital 08/14/22 s/p fall with R rib pain.  Imaging showing mildly displaced acute fx's R 3rd-10th ribs (5th rib fx'd in 2 places); small thoracic vertebral transverse process tip fx's T7-T9, and small R hemo-pneumothorax; also chronic L sided rib fx.  Pt admitted with hemopneumothrax and multiple rib fx's.  No current indication for surgery or chest tube.  PMH includes CHF, alcohol abuse, L breast CA s/p lumpectomy, CAD, closed R hip fx 09/20/19, htn, osteopenia, persistent a-fib, psoriasis, wide complex tachycardia, orthostatic hypotension, CKD, MGUS, chronic neck pain and low back pain, lymphedema, L ankle fx sx, cardioversion, carotid endarterectomy L shoulder sx, R THA.    PT Comments    Pt requesting to transfer to bedside chair upon arrival, but is open to progressing gait training with session as well. The pt demonstrates improved  LE strength, balance, and endurance for short distance in room ambulation into bedside chair. The pt presents with decreased gait speeds and at the completion of gait and transfer reports increased fatigue. She is left upright in bedside chair with all needs within reach and family present to assist with any immediate needs. The pt continues to require SNF for short term rehab.     Recommendations for follow up therapy are one component of a multi-disciplinary discharge planning process, led by the attending physician.  Recommendations may be updated based on patient status, additional functional criteria and insurance authorization.  Follow Up Recommendations  Skilled nursing-short term rehab (<3 hours/day) Can patient physically be transported by private vehicle: No   Assistance Recommended at Discharge Intermittent Supervision/Assistance  Patient can return home with the  following A lot of help with walking and/or transfers;A lot of help with bathing/dressing/bathroom   Equipment Recommendations  Rolling walker (2 wheels);BSC/3in1    Recommendations for Other Services       Precautions / Restrictions Precautions Precautions: Fall Precaution Comments: R sided rib fx's; monitor O2 Restrictions Weight Bearing Restrictions: No     Mobility  Bed Mobility Overal bed mobility: Needs Assistance Bed Mobility: Supine to Sit     Supine to sit: Min assist, HOB elevated          Transfers Overall transfer level: Needs assistance Equipment used: Rolling walker (2 wheels) Transfers: Sit to/from Stand Sit to Stand: Min guard                Ambulation/Gait Ambulation/Gait assistance: Min assist (Very close guarding d/t hx of knee buckle; none noted this session.) Gait Distance (Feet): 8 Feet Assistive device: Rolling walker (2 wheels)             Stairs             Wheelchair Mobility    Modified Rankin (Stroke Patients Only)       Balance Overall balance assessment: Needs assistance   Sitting balance-Leahy Scale: Fair       Standing balance-Leahy Scale: Fair                              Cognition Arousal/Alertness: Awake/alert Behavior During Therapy: WFL for tasks assessed/performed Overall Cognitive Status: Within Functional Limits for tasks assessed  Exercises      General Comments        Pertinent Vitals/Pain Pain Assessment Pain Assessment: Faces Faces Pain Scale: Hurts little more Pain Location: R sided rib fx's Pain Descriptors / Indicators: Discomfort, Sore Pain Intervention(s): Monitored during session, Other (comment) (Patient educated on splinting technique for pain and coughing.)    Home Living                          Prior Function            PT Goals (current goals can now be found in the care plan  section) Acute Rehab PT Goals Patient Stated Goal: to improve pain and mobility PT Goal Formulation: With patient Time For Goal Achievement: 08/30/22 Potential to Achieve Goals: Fair Progress towards PT goals: Progressing toward goals    Frequency    Min 2X/week      PT Plan Current plan remains appropriate    Co-evaluation              AM-PAC PT "6 Clicks" Mobility   Outcome Measure  Help needed turning from your back to your side while in a flat bed without using bedrails?: A Little Help needed moving from lying on your back to sitting on the side of a flat bed without using bedrails?: A Little Help needed moving to and from a bed to a chair (including a wheelchair)?: A Little Help needed standing up from a chair using your arms (e.g., wheelchair or bedside chair)?: A Little Help needed to walk in hospital room?: A Little Help needed climbing 3-5 steps with a railing? : A Lot 6 Click Score: 17    End of Session Equipment Utilized During Treatment: Oxygen (family assist with managing oxygen cords) Activity Tolerance: Patient tolerated treatment well Patient left: in bed;with call bell/phone within reach;with family/visitor present Nurse Communication: Mobility status PT Visit Diagnosis: Other abnormalities of gait and mobility (R26.89);Muscle weakness (generalized) (M62.81);History of falling (Z91.81);Pain Pain - Right/Left: Right Pain - part of body:  (chest wall (ribs))     Time: 1040-1105 PT Time Calculation (min) (ACUTE ONLY): 25 min  Charges:  $Gait Training: 23-37 mins                     11:57 AM, 08/22/22 Jovanie Verge A. Saverio Danker PT, DPT Physical Therapist - [facility] A Jaggar Benko 08/22/2022, 11:55 AM

## 2022-08-22 NOTE — Progress Notes (Signed)
[name]  PROGRESS NOTE  [name] GQB:[id number] DOB: Jan 04, 1941 DOA: 08/14/2022 PCP: [name], MD  HPI/Recap of past 24 hours: [name] is a 82 y.o. female with medical history significant of permanent A-fib on Eliquis, alcohol abuse, hypertension, CKD-3a, hyperlipidemia, diastolic CHF, gout, depression with anxiety, MGUS, psoriasis, left breast cancer, anemia, who presented on 08/14/2022 for evaluation after a mechanical fall and subsequent severe right rib cage pain.  She denied hitting head or LOC, reportedly declined CT's of head and neck.   ED Course: pt was found to have hemoglobin 10.7 (12.4 on 08/14/2022), stable renal function, afebrile and stable vitals with BP low/normal.     Imaging revealed right-sided lateral rib fractures of ribs 5-8, a small right apical pneumothorax (5% volume), right base opacity (atelectasis vs PNA), COPD chronic changes.  Also has chronic left-sided rib fractures.  Moderate left-side pleural effusion.  CT chest/abdomen/pelvis showed small volume right hemo-pneumothorax and pulmonary contusions. Rib fractures noted 3-10, with 5th rib fractured in two places.  Also small T7-T9 transverse process fractures.     Patient was admitted to medicine service with surgery consulted.   No chest tube has been required thus far, and no indication for surgical intervention.     1/27: repeat CXR, stable R apical PTX of ~5%, increased right pleural fluid. 1/29: no residual PTX on CXR today.  Stable but pain remains uncontrolled.   1/30: increased O2 needs overnight.  Started antibiotic, given a dose IV Lasix.  Suspect some fluid overload and ?developing pneumonia. 1/31: ongoing work of breathing, tachypnea, on NRB mask borderline/low sats with minimal exertion. Further diuresis.  08/20/2022: On nonrebreather.  Personally reviewed chest x-ray done on 08/19/2022 which showed bilateral pleural effusions with bibasilar opacities right greater than left,  concerning for underlying pneumonia.  On Rocephin.  Started diuresing with IV Lasix.  08/21/2022: The patient was seen and examined at her bedside.  Family members were present in the room.  She has no new complaints.  Weaning off O2 supplementation.  Ongoing diuresing with IV Lasix 40 mg twice daily.  2D echo completed, LVEF 60 to 65%.  2/3 : Patient seen and examined this morning.  No overnight events.  Patient continues to stay on high flow nasal cannula at 40 L with 45% FiO2.  On day 4 of ceftriaxone antibiotic for possible pneumonia.  Assessment/Plan: Principal Problem:   Multiple rib fractures Active Problems:   Hypokalemia   Hemopneumothorax   Lobar pneumonia (HCC)   Acute respiratory failure with hypoxia (HCC)   Bilateral pleural effusion   HTN (hypertension)   Chronic diastolic CHF (congestive heart failure) (HCC)   Chronic kidney disease, stage 3a (HCC)   Macrocytic anemia   AKI (acute kidney injury) ([place])   Gout   Depression with anxiety   Alcohol use   Atrial fibrillation, chronic (HCC)   Blunt injury to chest   Fall   Underweight   Pressure injury of skin  Multiple rib fractures See imaging reports.  On CT scan, right ribs 3 through 10 are fractured, with 5th rib two fractures.  Also T7-T9 transverse process fractures. --Pain control - scheduled Tylenol and PRN oxycodone, PRN morphine for breakthrough Encourage use of incentive spirometer.  Acute respiratory failure with hypoxia (HCC) Currently on HHFNC, on 40 L at 45% FiO2 Continue IV diuresing Incentive spirometer Maintain O2 saturation greater than 92%  Acute on chronic diastolic CHF BNP 828 Chest x-ray with bilateral pleural effusions 2D echo with normal  LVEF Continue strict I's and O's and daily weight And I&O   Bilateral pleural effusion IV diuresing Incentive spirometer Mobilize as tolerated   Lobar pneumonia (HCC) Pt likely developing a pneumonia secondary to multiple rib fractures and very poor  inspirations.  Continue Rocephin, day 4/5.   Hemopneumothorax Surgery following. Due to multiple rib fractures. Home Eliquis on hold.  Hemoglobin stable.  Resolved, post repletion, hypokalemia   AKI (acute kidney injury) ([place]) Resolved Avoid nephrotoxic agents and hypotension   Macrocytic anemia Hbg 12.4 on 08/14/22, on admission Hemoglobin uptrending 10.9 from 10.7.   HTN (hypertension) BPs are at goal. Continue to monitor vital signs  Blunt injury to chest Due to mechanical fall   Atrial fibrillation, chronic (HCC) Rate controlled on amiodarone Hold Eliquis due to hemopneumothorax   Alcohol use disorder No withdrawal signs.  D/C CIWA orders.   Depression with anxiety Continue home regimen   Gout Continue allopurinol   Fall With multiple R rib fractures and hemopneumothorax.  Pt declined CT head or neck on admission and denied striking her head or LOC. --PT/OT evaluations, continue --SNF recommended --TOC following for placement --Fall precautions, continue   Underweight Body mass index is 17.96 kg/m. Encourage oral protein calorie intake   Critical care time: 65 minutes.     Pressure injury of skin Pressure Injury 08/14/22 Vertebral column Lower;Medial Stage 1 -  Intact skin with non-blanchable redness of a localized area usually over a bony prominence. (Active)  08/14/22 1552  Location: Vertebral column  Location Orientation: Lower;Medial  Staging: Stage 1 -  Intact skin with non-blanchable redness of a localized area usually over a bony prominence.  Wound Description (Comments):   Present on Admission: Yes                Code Status: Full code  Family Communication: Updated family members at bedside on 08/20/2022 and 08/21/22  Disposition Plan: Plan is to discharge to SNF   Consultants: None.   DVT prophylaxis: Subcu Lovenox daily  Status is: Inpatient The patient requires at least 2 midnights for further evaluation and treatment of  present condition.    Objective: Vitals:   08/22/22 1200 08/22/22 1216 08/22/22 1300 08/22/22 1359  BP: (!) 154/66     Pulse: 61     Resp: (!) 24 (!) 21 (!) 25   Temp: 98 F (36.7 C)     TempSrc: Axillary     SpO2: 98% 100%  100%  Weight:        Intake/Output Summary (Last 24 hours) at 08/22/2022 1402 Last data filed at 08/22/2022 0623 Gross per 24 hour  Intake 540 ml  Output 950 ml  Net -410 ml   Filed Weights   08/18/22 0914 08/19/22 0814 08/20/22 0846  Weight: 62.7 kg 63 kg 58.8 kg    Exam:  General: 82 y.o. year-old female frail-appearing in no acute distress.  She is alert and oriented x 3. Cardiovascular: Regular rate and rhythm no rubs or gallops.  Respiratory: Mild rales at bases no wheezing noted.  t. Abdomen: Soft noted normal bowel sounds present.  Musculoskeletal: No lower extremity edema bilaterally.  Skin: No ulcerative lesions noted or rashes, Psychiatry: Mood is appropriate for condition and setting.   Data Reviewed: CBC: Recent Labs  Lab 08/16/22 0557 08/16/22 1613 08/17/22 0502 08/18/22 0438 08/19/22 0213 08/21/22 0404  WBC 8.2  --  9.4 9.6 10.0 8.7  HGB 9.7* 9.9* 10.2* 10.0* 10.7* 10.9*  HCT 30.6* 31.5* 32.4* 31.4* 32.3*  33.1*  MCV 108.5*  --  109.1* 107.5* 104.9* 104.4*  PLT 185  --  209 254 268 720   Basic Metabolic Panel: Recent Labs  Lab 08/17/22 0502 08/18/22 0438 08/19/22 0213 08/21/22 0404 08/22/22 0458  NA 137 135 136 138 136  K 4.8 4.1 3.6 3.3* 4.1  CL 105 108 103 96* 94*  CO2 '22 23 24 31 '$ 32  GLUCOSE 110* 107* 100* 89 99  BUN 35* 24* '18 21 23  '$ CREATININE 1.23* 0.82 0.73 0.80 0.88  CALCIUM 9.0 8.6* 9.1 9.0 8.8*  MG 2.0  --   --  1.5* 1.8  PHOS  --   --   --   --  2.5   GFR: Estimated Creatinine Clearance: 45 mL/min (by C-G formula based on SCr of 0.88 mg/dL). Liver Function Tests: No results for input(s): "AST", "ALT", "ALKPHOS", "BILITOT", "PROT", "ALBUMIN" in the last 168 hours.  No results for input(s): "LIPASE",  "AMYLASE" in the last 168 hours. No results for input(s): "AMMONIA" in the last 168 hours. Coagulation Profile: No results for input(s): "INR", "PROTIME" in the last 168 hours.  Cardiac Enzymes: No results for input(s): "CKTOTAL", "CKMB", "CKMBINDEX", "TROPONINI" in the last 168 hours. BNP (last 3 results) Recent Labs    02/27/22 1106  PROBNP 420.0*   HbA1C: No results for input(s): "HGBA1C" in the last 72 hours. CBG: No results for input(s): "GLUCAP" in the last 168 hours. Lipid Profile: No results for input(s): "CHOL", "HDL", "LDLCALC", "TRIG", "CHOLHDL", "LDLDIRECT" in the last 72 hours. Thyroid Function Tests: No results for input(s): "TSH", "T4TOTAL", "FREET4", "T3FREE", "THYROIDAB" in the last 72 hours. Anemia Panel: No results for input(s): "VITAMINB12", "FOLATE", "FERRITIN", "TIBC", "IRON", "RETICCTPCT" in the last 72 hours. Urine analysis: No results found for: "COLORURINE", "APPEARANCEUR", "LABSPEC", "PHURINE", "GLUCOSEU", "HGBUR", "BILIRUBINUR", "KETONESUR", "PROTEINUR", "UROBILINOGEN", "NITRITE", "LEUKOCYTESUR" Sepsis Labs: '@LABRCNTIP'$ (procalcitonin:4,lacticidven:4)  )No results found for this or any previous visit (from the past 240 hour(s)).    Studies: DG Chest Port 1 View  Result Date: 08/21/2022 CLINICAL DATA:  Hypoxia, recent right rib fractures EXAM: PORTABLE CHEST 1 VIEW COMPARISON:  08/19/2022 FINDINGS: Single frontal view of the chest demonstrates a stable cardiac silhouette. Diffuse interstitial prominence is again noted, with slight improvement in the bibasilar ground-glass airspace disease seen previously. Multiple right-sided rib fractures are again noted, with stable right pleural effusion and right basilar consolidation. Small left pleural effusion is unchanged. No pneumothorax. IMPRESSION: 1. Stable right-sided rib fractures and moderate right pleural effusion. No pneumothorax. 2. Persistent but improving interstitial and ground-glass opacities throughout  the lungs, which may reflect improving volume status. 3. Small left pleural effusion. Electronically Signed   By: Randa Ngo M.D.   On: 08/21/2022 19:22    Scheduled Meds:  acetaminophen  1,000 mg Oral Q6H   allopurinol  150 mg Oral Daily   amiodarone  200 mg Oral BID   cholecalciferol  2,000 Units Oral Daily   enoxaparin (LOVENOX) injection  40 mg Subcutaneous N47S   folic acid  1 mg Oral Daily   furosemide  40 mg Intravenous BID   gabapentin  100 mg Oral TID   lidocaine  1 patch Transdermal Q24H   magnesium oxide  400 mg Oral BID   methocarbamol  750 mg Oral Q8H   mirtazapine  30 mg Oral QHS   multivitamin with minerals  1 tablet Oral Daily   potassium chloride  20 mEq Oral BID   thiamine  100 mg Oral Daily   Or  thiamine  100 mg Intravenous Daily    Continuous Infusions:  cefTRIAXone (ROCEPHIN)  IV 1 g (08/22/22 1153)     LOS: 8 days     Oran Rein, MD [facility] Pager [phone]  If 7PM-7AM, please contact night-coverage [email or web address] Password [email or web address] 08/22/2022, 2:02 PM

## 2022-08-23 LAB — BASIC METABOLIC PANEL
Anion gap: 8 (ref 5–15)
BUN: 30 mg/dL — ABNORMAL HIGH (ref 8–23)
CO2: 34 mmol/L — ABNORMAL HIGH (ref 22–32)
Calcium: 8.7 mg/dL — ABNORMAL LOW (ref 8.9–10.3)
Chloride: 95 mmol/L — ABNORMAL LOW (ref 98–111)
Creatinine, Ser: 1.07 mg/dL — ABNORMAL HIGH (ref 0.44–1.00)
GFR, Estimated: 52 mL/min — ABNORMAL LOW (ref >60)
Glucose, Bld: 95 mg/dL (ref 70–99)
Potassium: 4.7 mmol/L (ref 3.5–5.1)
Sodium: 137 mmol/L (ref 135–145)

## 2022-08-23 LAB — CBC
HCT: 28.3 % — ABNORMAL LOW (ref 36.0–46.0)
Hemoglobin: 9.4 g/dL — ABNORMAL LOW (ref 12.0–15.0)
MCH: 34.6 pg — ABNORMAL HIGH (ref 26.0–34.0)
MCHC: 33.2 g/dL (ref 30.0–36.0)
MCV: 104 fL — ABNORMAL HIGH (ref 80.0–100.0)
Platelets: 381 10*3/uL (ref 150–400)
RBC: 2.72 MIL/uL — ABNORMAL LOW (ref 3.87–5.11)
RDW: 13.8 % (ref 11.5–15.5)
WBC: 7 10*3/uL (ref 4.0–10.5)
nRBC: 0 % (ref 0.0–0.2)

## 2022-08-23 LAB — MAGNESIUM: Magnesium: 2.1 mg/dL (ref 1.7–2.4)

## 2022-08-23 MED ORDER — FLUTICASONE PROPIONATE 50 MCG/ACT NA SUSP
2.0000 | Freq: Two times a day (BID) | NASAL | Status: DC | PRN
  Administered 2022-08-23 – 2022-08-24 (×2): 2 via NASAL
  Filled 2022-08-23: qty 16

## 2022-08-23 NOTE — Progress Notes (Signed)
..  PROGRESS NOTE  [name] DQQ:[id number] DOB: July 11, 1941 DOA: 08/14/2022 PCP: [name], MD  HPI/Recap of past 24 hours: [name] is a 82 y.o. female with medical history significant of permanent A-fib on Eliquis, alcohol abuse, hypertension, CKD-3a, hyperlipidemia, diastolic CHF, gout, depression with anxiety, MGUS, psoriasis, left breast cancer, anemia, who presented on 08/14/2022 for evaluation after a mechanical fall and subsequent severe right rib cage pain.  She denied hitting head or LOC, reportedly declined CT's of head and neck.   ED Course: pt was found to have hemoglobin 10.7 (12.4 on 08/14/2022), stable renal function, afebrile and stable vitals with BP low/normal.     Imaging revealed right-sided lateral rib fractures of ribs 5-8, a small right apical pneumothorax (5% volume), right base opacity (atelectasis vs PNA), COPD chronic changes.  Also has chronic left-sided rib fractures.  Moderate left-side pleural effusion.  CT chest/abdomen/pelvis showed small volume right hemo-pneumothorax and pulmonary contusions. Rib fractures noted 3-10, with 5th rib fractured in two places.  Also small T7-T9 transverse process fractures.     Patient was admitted to medicine service with surgery consulted.   No chest tube has been required thus far, and no indication for surgical intervention.     1/27: repeat CXR, stable R apical PTX of ~5%, increased right pleural fluid. 1/29: no residual PTX on CXR today.  Stable but pain remains uncontrolled.   1/30: increased O2 needs overnight.  Started antibiotic, given a dose IV Lasix.  Suspect some fluid overload and ?developing pneumonia. 1/31: ongoing work of breathing, tachypnea, on NRB mask borderline/low sats with minimal exertion. Further diuresis.  08/20/2022: On nonrebreather.  Personally reviewed chest x-ray done on 08/19/2022 which showed bilateral pleural effusions with bibasilar opacities right greater than left,  concerning for underlying pneumonia.  On Rocephin.  Started diuresing with IV Lasix.  08/21/2022: The patient was seen and examined at her bedside.  Family members were present in the room.  She has no new complaints.  Weaning off O2 supplementation.  Ongoing diuresing with IV Lasix 40 mg twice daily.  2D echo completed, LVEF 60 to 65%.  2/3 : Patient seen and examined this morning.  No overnight events.  Patient continues to stay on high flow nasal cannula at 40 L with 45% FiO2.  On day 4 of ceftriaxone antibiotic for possible pneumonia.  2/4 : Patient seen and examined this morning.  No overnight events.  Continues to stay on high flow 35 L / 35% FiO2.  Weaning in process.  Assessment/Plan: Principal Problem:   Multiple rib fractures Active Problems:   Hypokalemia   Hemopneumothorax   Lobar pneumonia (HCC)   Acute respiratory failure with hypoxia (HCC)   Bilateral pleural effusion   HTN (hypertension)   Chronic diastolic CHF (congestive heart failure) (HCC)   Chronic kidney disease, stage 3a (HCC)   Macrocytic anemia   AKI (acute kidney injury) ([place])   Gout   Depression with anxiety   Alcohol use   Atrial fibrillation, chronic (HCC)   Blunt injury to chest   Fall   Underweight   Pressure injury of skin  Multiple rib fractures See imaging reports.  On CT scan, right ribs 3 through 10 are fractured, with 5th rib two fractures.  Also T7-T9 transverse process fractures. --Pain control - scheduled Tylenol and PRN oxycodone, PRN morphine for breakthrough Encourage use of incentive spirometer.  Acute respiratory failure with hypoxia (HCC) Currently on HHFNC, on 40 L at 45% FiO2  Continue IV diuresing Incentive spirometer Maintain O2 saturation greater than 92%  Acute on chronic diastolic CHF BNP 347 Chest x-ray with bilateral pleural effusions 2D echo with normal LVEF Continue strict I's and O's and daily weight And I&O   Bilateral pleural effusion IV diuresing Incentive  spirometer Mobilize as tolerated   Lobar pneumonia (HCC) Pt likely developing a pneumonia secondary to multiple rib fractures and very poor inspirations.  Continue Rocephin, day 4/5.   Hemopneumothorax Surgery following. Due to multiple rib fractures. Home Eliquis on hold.  Hemoglobin stable.  Resolved, post repletion, hypokalemia   AKI (acute kidney injury) ([place]) Resolved Avoid nephrotoxic agents and hypotension   Macrocytic anemia Hbg 12.4 on 08/14/22, on admission Hemoglobin uptrending 10.9 from 10.7.   HTN (hypertension) BPs are at goal. Continue to monitor vital signs  Blunt injury to chest Due to mechanical fall   Atrial fibrillation, chronic (HCC) Rate controlled on amiodarone Hold Eliquis due to hemopneumothorax   Alcohol use disorder No withdrawal signs.  D/C CIWA orders.   Depression with anxiety Continue home regimen   Gout Continue allopurinol   Fall With multiple R rib fractures and hemopneumothorax.  Pt declined CT head or neck on admission and denied striking her head or LOC. --PT/OT evaluations, continue --SNF recommended --TOC following for placement --Fall precautions, continue   Underweight Body mass index is 17.96 kg/m. Encourage oral protein calorie intake   Critical care time: 65 minutes.     Pressure injury of skin Pressure Injury 08/14/22 Vertebral column Lower;Medial Stage 1 -  Intact skin with non-blanchable redness of a localized area usually over a bony prominence. (Active)  08/14/22 1552  Location: Vertebral column  Location Orientation: Lower;Medial  Staging: Stage 1 -  Intact skin with non-blanchable redness of a localized area usually over a bony prominence.  Wound Description (Comments):   Present on Admission: Yes    Code Status: Full code  Family Communication: Updated family members at bedside on 08/20/2022 and 08/21/22  Disposition Plan: Plan is to discharge to SNF   Consultants: None.  DVT  prophylaxis: Subcu Lovenox daily  Status is: Inpatient The patient requires at least 2 midnights for further evaluation and treatment of present condition.    Objective: Vitals:   08/23/22 0417 08/23/22 0741 08/23/22 0813 08/23/22 1145  BP: (!) 114/57  (!) 166/82 (!) 130/59  Pulse: (!) 59  72 60  Resp: 17   (!) 24  Temp: 97.9 F (36.6 C)   97.9 F (36.6 C)  TempSrc: Oral   Oral  SpO2: 92% 96% 91% 96%  Weight:        Intake/Output Summary (Last 24 hours) at 08/23/2022 1213 Last data filed at 08/23/2022 1148 Gross per 24 hour  Intake 460 ml  Output 1425 ml  Net -965 ml   Filed Weights   08/18/22 0914 08/19/22 0814 08/20/22 0846  Weight: 62.7 kg 63 kg 58.8 kg    Exam:  General: 82 y.o. year-old female frail-appearing in no acute distress.  She is alert and oriented x 3. Cardiovascular: Regular rate and rhythm no rubs or gallops.  Respiratory: Decreased breath sounds bilaterally.  Poor air entry.  Abdomen: Soft noted normal bowel sounds present.  Musculoskeletal: No lower extremity edema bilaterally.  Skin: No ulcerative lesions noted or rashes, Psychiatry: Mood is appropriate for condition and setting.   Data Reviewed: CBC: Recent Labs  Lab 08/17/22 0502 08/18/22 0438 08/19/22 0213 08/21/22 0404 08/23/22 0528  WBC 9.4 9.6 10.0 8.7 7.0  HGB 10.2* 10.0* 10.7* 10.9* 9.4*  HCT 32.4* 31.4* 32.3* 33.1* 28.3*  MCV 109.1* 107.5* 104.9* 104.4* 104.0*  PLT 209 254 268 327 914   Basic Metabolic Panel: Recent Labs  Lab 08/17/22 0502 08/18/22 0438 08/19/22 0213 08/21/22 0404 08/22/22 0458 08/23/22 0528  NA 137 135 136 138 136 137  K 4.8 4.1 3.6 3.3* 4.1 4.7  CL 105 108 103 96* 94* 95*  CO2 '22 23 24 31 '$ 32 34*  GLUCOSE 110* 107* 100* 89 99 95  BUN 35* 24* '18 21 23 '$ 30*  CREATININE 1.23* 0.82 0.73 0.80 0.88 1.07*  CALCIUM 9.0 8.6* 9.1 9.0 8.8* 8.7*  MG 2.0  --   --  1.5* 1.8 2.1  PHOS  --   --   --   --  2.5  --    GFR: Estimated Creatinine Clearance: 37 mL/min  (A) (by C-G formula based on SCr of 1.07 mg/dL (H)). Liver Function Tests: No results for input(s): "AST", "ALT", "ALKPHOS", "BILITOT", "PROT", "ALBUMIN" in the last 168 hours.  No results for input(s): "LIPASE", "AMYLASE" in the last 168 hours. No results for input(s): "AMMONIA" in the last 168 hours. Coagulation Profile: No results for input(s): "INR", "PROTIME" in the last 168 hours.  Cardiac Enzymes: No results for input(s): "CKTOTAL", "CKMB", "CKMBINDEX", "TROPONINI" in the last 168 hours. BNP (last 3 results) Recent Labs    02/27/22 1106  PROBNP 420.0*   HbA1C: No results for input(s): "HGBA1C" in the last 72 hours. CBG: No results for input(s): "GLUCAP" in the last 168 hours. Lipid Profile: No results for input(s): "CHOL", "HDL", "LDLCALC", "TRIG", "CHOLHDL", "LDLDIRECT" in the last 72 hours. Thyroid Function Tests: No results for input(s): "TSH", "T4TOTAL", "FREET4", "T3FREE", "THYROIDAB" in the last 72 hours. Anemia Panel: No results for input(s): "VITAMINB12", "FOLATE", "FERRITIN", "TIBC", "IRON", "RETICCTPCT" in the last 72 hours. Urine analysis: No results found for: "COLORURINE", "APPEARANCEUR", "LABSPEC", "PHURINE", "GLUCOSEU", "HGBUR", "BILIRUBINUR", "KETONESUR", "PROTEINUR", "UROBILINOGEN", "NITRITE", "LEUKOCYTESUR" Sepsis Labs: '@LABRCNTIP'$ (procalcitonin:4,lacticidven:4)  )No results found for this or any previous visit (from the past 240 hour(s)).    Studies: No results found.  Scheduled Meds:  acetaminophen  1,000 mg Oral Q6H   allopurinol  150 mg Oral Daily   amiodarone  200 mg Oral BID   cholecalciferol  2,000 Units Oral Daily   enoxaparin (LOVENOX) injection  40 mg Subcutaneous N82N   folic acid  1 mg Oral Daily   furosemide  40 mg Intravenous BID   gabapentin  100 mg Oral TID   lidocaine  1 patch Transdermal Q24H   magnesium oxide  400 mg Oral BID   methocarbamol  750 mg Oral Q8H   mirtazapine  30 mg Oral QHS   multivitamin with minerals  1  tablet Oral Daily   potassium chloride  20 mEq Oral BID   thiamine  100 mg Oral Daily   Or   thiamine  100 mg Intravenous Daily    Continuous Infusions:  cefTRIAXone (ROCEPHIN)  IV 1 g (08/23/22 1012)     LOS: 9 days     Oran Rein, MD [facility] Pager [phone]  If 7PM-7AM, please contact night-coverage [email or web address] Password [email or web address] 08/23/2022, 12:13 PM

## 2022-08-24 DIAGNOSIS — S2242XK Multiple fractures of ribs, left side, subsequent encounter for fracture with nonunion: Secondary | ICD-10-CM | POA: Diagnosis not present

## 2022-08-24 DIAGNOSIS — S298XXA Other specified injuries of thorax, initial encounter: Secondary | ICD-10-CM | POA: Diagnosis not present

## 2022-08-24 DIAGNOSIS — J942 Hemothorax: Secondary | ICD-10-CM | POA: Diagnosis not present

## 2022-08-24 DIAGNOSIS — S270XXA Traumatic pneumothorax, initial encounter: Secondary | ICD-10-CM | POA: Diagnosis not present

## 2022-08-24 DIAGNOSIS — Z515 Encounter for palliative care: Secondary | ICD-10-CM

## 2022-08-24 DIAGNOSIS — W19XXXA Unspecified fall, initial encounter: Secondary | ICD-10-CM | POA: Diagnosis not present

## 2022-08-24 DIAGNOSIS — I482 Chronic atrial fibrillation, unspecified: Secondary | ICD-10-CM | POA: Diagnosis not present

## 2022-08-24 DIAGNOSIS — S2241XA Multiple fractures of ribs, right side, initial encounter for closed fracture: Secondary | ICD-10-CM | POA: Diagnosis not present

## 2022-08-24 LAB — CBC
HCT: 29.5 % — ABNORMAL LOW (ref 36.0–46.0)
Hemoglobin: 9.9 g/dL — ABNORMAL LOW (ref 12.0–15.0)
MCH: 35 pg — ABNORMAL HIGH (ref 26.0–34.0)
MCHC: 33.6 g/dL (ref 30.0–36.0)
MCV: 104.2 fL — ABNORMAL HIGH (ref 80.0–100.0)
Platelets: 412 10*3/uL — ABNORMAL HIGH (ref 150–400)
RBC: 2.83 MIL/uL — ABNORMAL LOW (ref 3.87–5.11)
RDW: 13.6 % (ref 11.5–15.5)
WBC: 7.1 10*3/uL (ref 4.0–10.5)
nRBC: 0 % (ref 0.0–0.2)

## 2022-08-24 LAB — BASIC METABOLIC PANEL
Anion gap: 12 (ref 5–15)
BUN: 25 mg/dL — ABNORMAL HIGH (ref 8–23)
CO2: 32 mmol/L (ref 22–32)
Calcium: 8.8 mg/dL — ABNORMAL LOW (ref 8.9–10.3)
Chloride: 93 mmol/L — ABNORMAL LOW (ref 98–111)
Creatinine, Ser: 0.96 mg/dL (ref 0.44–1.00)
GFR, Estimated: 59 mL/min — ABNORMAL LOW (ref >60)
Glucose, Bld: 94 mg/dL (ref 70–99)
Potassium: 4 mmol/L (ref 3.5–5.1)
Sodium: 137 mmol/L (ref 135–145)

## 2022-08-24 MED ORDER — IPRATROPIUM-ALBUTEROL 0.5-2.5 (3) MG/3ML IN SOLN
3.0000 mL | Freq: Once | RESPIRATORY_TRACT | Status: AC
  Administered 2022-08-24: 3 mL via RESPIRATORY_TRACT
  Filled 2022-08-24: qty 3

## 2022-08-24 MED ORDER — OXYCODONE HCL 5 MG PO TABS
5.0000 mg | ORAL_TABLET | ORAL | Status: DC | PRN
  Administered 2022-08-25 – 2022-08-26 (×4): 5 mg via ORAL
  Filled 2022-08-24 (×4): qty 1

## 2022-08-24 MED ORDER — SALINE SPRAY 0.65 % NA SOLN
1.0000 | NASAL | Status: DC | PRN
  Administered 2022-08-24: 1 via NASAL
  Filled 2022-08-24: qty 44

## 2022-08-24 MED ORDER — IPRATROPIUM-ALBUTEROL 0.5-2.5 (3) MG/3ML IN SOLN
3.0000 mL | Freq: Two times a day (BID) | RESPIRATORY_TRACT | Status: DC
  Administered 2022-08-25 – 2022-08-26 (×3): 3 mL via RESPIRATORY_TRACT
  Filled 2022-08-24 (×3): qty 3

## 2022-08-24 MED ORDER — IPRATROPIUM-ALBUTEROL 0.5-2.5 (3) MG/3ML IN SOLN: 3.0000 mL | Freq: Four times a day (QID) | RESPIRATORY_TRACT | Status: DC

## 2022-08-24 NOTE — Progress Notes (Signed)
Palliative Care Progress Note, Assessment & Plan   Patient Name: [name]       Date: 08/24/2022 DOB: 06/26/1941  Age: 82 y.o. MRN#: [id number] Attending Physician: Richarda Osmond, MD Primary Care Physician: [name], MD Admit Date: 08/14/2022  Subjective: Patient is out of bed and sitting in the recliner.  She acknowledges my presence and is able to make her wishes known.  High flow nasal cannula remains in place.  During our visit, respiratory therapy visited as well.  Plan is for nebulizer treatment to be administered and patient to be converted to nasal cannula-continue with wean.  No family or friends present at bedside.  HPI: [name] is a 82 y.o. female with medical history significant of A-fib on Eliquis, alcohol abuse, hypertension, CKD-3a, hyperlipidemia, diastolic CHF, gout, depression with anxiety, MGUS, psoriasis, left breast cancer, anemia, who presented on 08/14/2022 for evaluation after a mechanical fall and subsequent severe right rib cage pain.  She denied hitting head or LOC, reportedly declined CT's of head and neck.   ED Course: pt was found to have hemoglobin 10.7 (12.4 on 08/14/2022), stable renal function, afebrile and stable vitals with BP low/normal.     Imaging revealed right-sided lateral rib fractures of ribs 3, 5-8, 10 a small right apical pneumothorax, hemothorax, bilateral opacities.  Also has chronic left-sided rib fractures. Moderate left-side pleural effusion.  Summary of counseling/coordination of care: After reviewing the patient's chart and assessing the patient at bedside, I spoke with patient in regards to plan and goals of care.  She shares she is grateful to feel upset to seeing people other than her family.  She says she started to feel  more like herself.  We discussed that she has weaned to almost as low as she can get on high flow nasal cannula.  Plan is to convert her to nasal cannula, step in the right direction.  Patient is glad that she is improving.  I attempted to elicit goals and values important to the patient.  I highlighted that in previous discussions with my colleagues and her family, patient had made her wishes known as far as CODE STATUS.  Patient again today was clear that she would never want her life to be sustained artificially by machines.  In the event of cardiopulmonary arrest she would not want resuscitative measures or use of mechanical ventilation.  However, she is not prepared to make final decision and allow me to make the adjustments in her medical record at this time.  Patient shares that they plan to "flush out my nose" today.  Patient is concerned that she will become claustrophobic and anxious when this occurs.  We reviewed that the nasal washout will help clear up her sinuses and is a temporary and minimally invasive procedure that is usually not painful.  We reviewed that patient could have Oxy IR as needed prior to nasal washout.  She agreed and shared she would ask for the medication before flushing out her nasal passages.   We discussed importance of nutrition and patient's overall prognosis.  She shares she has never been much of an eater and often is turned off by food after a  few bites.  Reviewed food as fuel and not fun.  Discussed taking small bites and snacking throughout the day.  Patient shares she will do her best to increase her p.o. intake as she knows she does not want to continue to lose weight.  No other acute issues today.   Ongoing support will be given.  Plan on visiting with the patient again tomorrow to continue goals of care discussions.  Physical Exam Vitals reviewed.  Constitutional:      General: She is not in acute distress.    Appearance: She is normal weight.  HENT:      Mouth/Throat:     Mouth: Mucous membranes are moist.  Eyes:     Pupils: Pupils are equal, round, and reactive to light.  Cardiovascular:     Rate and Rhythm: Normal rate.     Pulses: Normal pulses.  Pulmonary:     Effort: Pulmonary effort is normal.  Abdominal:     Palpations: Abdomen is soft.  Musculoskeletal:        General: Normal range of motion.  Skin:    General: Skin is warm and dry.  Neurological:     Mental Status: She is alert and oriented to person, place, and time.  Psychiatric:        Mood and Affect: Mood normal.        Behavior: Behavior normal.        Thought Content: Thought content normal.        Judgment: Judgment normal.             Total Time 50 minutes   Leeroy Lovings L. Ilsa Iha, FNP-BC Palliative Medicine Team Team Phone # [phone]

## 2022-08-24 NOTE — Progress Notes (Signed)
Physical Therapy Treatment Patient Details Name: [name] MRN: [id number] DOB: 1941/06/30 Today's Date: 08/24/2022   History of Present Illness Pt is an 82 y.o. female presenting to hospital 08/14/22 s/p fall with R rib pain.  Imaging showing mildly displaced acute fx's R 3rd-10th ribs (5th rib fx'd in 2 places); small thoracic vertebral transverse process tip fx's T7-T9, and small R hemo-pneumothorax; also chronic L sided rib fx.  Pt admitted with hemopneumothrax and multiple rib fx's.  No current indication for surgery or chest tube.  PMH includes CHF, alcohol abuse, L breast CA s/p lumpectomy, CAD, closed R hip fx 09/20/19, htn, osteopenia, persistent a-fib, psoriasis, wide complex tachycardia, orthostatic hypotension, CKD, MGUS, chronic neck pain and low back pain, lymphedema, L ankle fx sx, cardioversion, carotid endarterectomy L shoulder sx, R THA.    PT Comments    Pt was pleasant and motivated to participate during the session and put forth good effort throughout. Pt found on 4LO2/min with SpO2 around 90% but with poor pleth.  Attempted to monitor SpO2 at multiple times during the session but unable to obtain reliable readings.  Pt presented with grossly improved functional strength and activity tolerance compared to prior session but overall each remains significantly below baseline.  Pt remains at an elevated risk for falls and further functional decline and will benefit from PT services in a SNF setting upon discharge to safely address deficits listed in patient problem list for decreased caregiver assistance and eventual return to PLOF.     Recommendations for follow up therapy are one component of a multi-disciplinary discharge planning process, led by the attending physician.  Recommendations may be updated based on patient status, additional functional criteria and insurance authorization.  Follow Up Recommendations  Skilled nursing-short term rehab (<3 hours/day) Can patient  physically be transported by private vehicle: Yes   Assistance Recommended at Discharge Intermittent Supervision/Assistance  Patient can return home with the following A little help with walking and/or transfers;A little help with bathing/dressing/bathroom;Assist for transportation;Assistance with cooking/housework   Equipment Recommendations  Rolling walker (2 wheels);BSC/3in1    Recommendations for Other Services       Precautions / Restrictions Precautions Precautions: Fall Precaution Comments: R sided rib fx's; monitor O2 Restrictions Weight Bearing Restrictions: No     Mobility  Bed Mobility               General bed mobility comments: NT, pt in recliner    Transfers Overall transfer level: Needs assistance Equipment used: Rolling walker (2 wheels) Transfers: Sit to/from Stand Sit to Stand: Min guard, Min assist           General transfer comment: Pt required min A to come to full upright standing during 1st attempt but CGA for remainder of the session with cues for hand placement    Ambulation/Gait Ambulation/Gait assistance: Min guard Gait Distance (Feet): 15 Feet x 1, 10 Feet x 1 Assistive device: Rolling walker (2 wheels) Gait Pattern/deviations: Decreased step length - right, Decreased step length - left, Decreased stride length, Step-through pattern, Trunk flexed Gait velocity: decreased     General Gait Details: Slow cadence with short B step length but steady without LOB   Stairs             Wheelchair Mobility    Modified Rankin (Stroke Patients Only)       Balance Overall balance assessment: Needs assistance, History of Falls Sitting-balance support: Feet supported Sitting balance-Leahy Scale: Good     Standing  balance support: Bilateral upper extremity supported, During functional activity, Reliant on assistive device for balance Standing balance-Leahy Scale: Fair                              Cognition  Arousal/Alertness: Awake/alert Behavior During Therapy: WFL for tasks assessed/performed Overall Cognitive Status: Within Functional Limits for tasks assessed                                          Exercises Total Joint Exercises Ankle Circles/Pumps: Strengthening, Both, 15 reps Towel Squeeze: Strengthening, Both, 15 reps Long Arc Quad: Strengthening, Both, 15 reps Knee Flexion: Strengthening, Both, 15 reps Marching in Standing: Strengthening, Both, 5 reps, Standing    General Comments        Pertinent Vitals/Pain Pain Assessment Pain Assessment: 0-10 Pain Score: 3  Pain Location: R sided rib fx's Pain Descriptors / Indicators: Discomfort, Sore Pain Intervention(s): Repositioned, Premedicated before session, Monitored during session    Home Living                          Prior Function            PT Goals (current goals can now be found in the care plan section) Progress towards PT goals: Progressing toward goals    Frequency    Min 2X/week      PT Plan Current plan remains appropriate    Co-evaluation              AM-PAC PT "6 Clicks" Mobility   Outcome Measure  Help needed turning from your back to your side while in a flat bed without using bedrails?: A Little Help needed moving from lying on your back to sitting on the side of a flat bed without using bedrails?: A Little Help needed moving to and from a bed to a chair (including a wheelchair)?: A Little Help needed standing up from a chair using your arms (e.g., wheelchair or bedside chair)?: A Little Help needed to walk in hospital room?: A Little Help needed climbing 3-5 steps with a railing? : A Lot 6 Click Score: 17    End of Session Equipment Utilized During Treatment: Gait belt;Oxygen Activity Tolerance: Patient tolerated treatment well Patient left: with call bell/phone within reach;in chair Nurse Communication: Mobility status;Other (comment) (Pt found in  recliner with no chair alarm and left without alarm at end of session, nursing notified) PT Visit Diagnosis: Other abnormalities of gait and mobility (R26.89);Muscle weakness (generalized) (M62.81);History of falling (Z91.81);Pain Pain - Right/Left: Right     Time: 4825-0037 PT Time Calculation (min) (ACUTE ONLY): 29 min  Charges:  $Gait Training: 8-22 mins $Therapeutic Exercise: 8-22 mins                    D. Scott Zaim Nitta PT, DPT 08/24/22, 3:27 PM

## 2022-08-24 NOTE — TOC Progression Note (Signed)
Transition of Care [facility]) - Progression Note    Patient Details  Name: [name] MRN: [id number] Date of Birth: 07-07-1941  [facility] CM/SW Contact  Laurena Slimmer, RN Phone Number: 08/24/2022, 1:51 PM  Clinical Narrative:    Case reviewed for needs and changes in disposition.         Expected Discharge Plan and Services                                               Social Determinants of Health (SDOH) Interventions SDOH Screenings   Food Insecurity: No Food Insecurity (02/13/2022)  Housing: Low Risk  (02/13/2022)  Transportation Needs: No Transportation Needs (02/13/2022)  Depression (PHQ2-9): Low Risk  (08/04/2022)  Financial Resource Strain: Low Risk  (02/13/2022)  Physical Activity: Unknown (02/10/2021)  Social Connections: Unknown (02/13/2022)  Stress: No Stress Concern Present (02/13/2022)  Tobacco Use: Medium Risk (08/17/2022)    Readmission Risk Interventions     No data to display

## 2022-08-24 NOTE — Progress Notes (Signed)
..  PROGRESS NOTE  [name]:[id number] DOB: Aug 16, 1940 DOA: 08/14/2022 PCP: [name], MD  HPI/Recap of past 24 hours: [name] is a 82 y.o. female with medical history significant of permanent A-fib on Eliquis, alcohol abuse, hypertension, CKD-3a, hyperlipidemia, diastolic CHF, gout, depression with anxiety, MGUS, psoriasis, left breast cancer, anemia, who presented on 08/14/2022 for evaluation after a mechanical fall and subsequent severe right rib cage pain.  She denied hitting head or LOC, reportedly declined CT's of head and neck.   ED Course: pt was found to have hemoglobin 10.7 (12.4 on 08/14/2022), stable renal function, afebrile and stable vitals with BP low/normal.     Imaging revealed right-sided lateral rib fractures of ribs 5-8, a small right apical pneumothorax (5% volume), right base opacity (atelectasis vs PNA), COPD chronic changes.  Also has chronic left-sided rib fractures.  Moderate left-side pleural effusion.  CT chest/abdomen/pelvis showed small volume right hemo-pneumothorax and pulmonary contusions. Rib fractures noted 3-10, with 5th rib fractured in two places.  Also small T7-T9 transverse process fractures.     Patient was admitted to medicine service with surgery consulted.   No chest tube has been required thus far, and no indication for surgical intervention.     1/27: repeat CXR, stable R apical PTX of ~5%, increased right pleural fluid. 1/29: no residual PTX on CXR today.  Stable but pain remains uncontrolled.   1/30: increased O2 needs overnight.  Started antibiotic, given a dose IV Lasix.  Suspect some fluid overload and ?developing pneumonia. 1/31: ongoing work of breathing, tachypnea, on NRB mask borderline/low sats with minimal exertion. Further diuresis.  08/20/2022: On nonrebreather.  Personally reviewed chest x-ray done on 08/19/2022 which showed bilateral pleural effusions with bibasilar opacities right greater than left,  concerning for underlying pneumonia.  On Rocephin.  Started diuresing with IV Lasix.  2/2-2/5: patient having very slow and gradual improvement in respiratory status. Now transitioned from HFNC to regular [place] on 4L.  Assessment/Plan: Principal Problem:   Multiple rib fractures Active Problems:   Hypokalemia   Hemopneumothorax   Lobar pneumonia (HCC)   Acute respiratory failure with hypoxia (HCC)   Bilateral pleural effusion   HTN (hypertension)   Chronic diastolic CHF (congestive heart failure) (HCC)   Chronic kidney disease, stage 3a (HCC)   Macrocytic anemia   AKI (acute kidney injury) ([place])   Gout   Depression with anxiety   Alcohol use   Atrial fibrillation, chronic (HCC)   Blunt injury to chest   Fall   Underweight   Pressure injury of skin  Multiple rib fractures See imaging reports.  On CT scan, right ribs 3 through 10 are fractured, with 5th rib two fractures.  Also T7-T9 transverse process fractures. --Pain control - scheduled Tylenol and PRN oxycodone, PRN morphine for breakthrough Encourage use of incentive spirometer.  Acute respiratory failure with hypoxia (HCC) Lobar pneumonia ([place] Transitioned from HFNC to [place] 4L today.  - Continue IV diuresing - Incentive spirometer - continue duonebs for mild wheezing heard on exam today - Continue Rocephin, day 4/5.  Acute on chronic diastolic CHF Bilateral pleural effusion BNP 522 Chest x-ray with bilateral pleural effusions 2D echo with normal LVEF Continue strict I's and O's and daily weight  Hemopneumothorax- Due to multiple rib fractures. Hemoglobin stable. Surgery following Home Eliquis on hold. May restart eliquis tomorrow if repeat chest xray stable/improved  Hypokalemia- Resolved, post repletion   AKI (acute kidney injury) ([place]) Resolved Avoid nephrotoxic agents and  hypotension   Macrocytic anemia Hbg 12.4 on 08/14/22, on admission Hemoglobin uptrending 10.9 from 10.7.   HTN (hypertension) BPs are  at goal. Continue to monitor vital signs   Atrial fibrillation, chronic (HCC) Rate controlled on amiodarone Hold Eliquis due to hemopneumothorax. May restart tomorrow   Alcohol use disorder No withdrawal signs.   Depression with anxiety Continue home regimen   Gout Continue allopurinol   Fall With multiple R rib fractures and hemopneumothorax.  Pt declined CT head or neck on admission and denied striking her head or LOC. --PT/OT evaluations, continue --SNF recommended --TOC following for placement --Fall precautions, continue   Underweight Body mass index is 17.96 kg/m. Encourage oral protein calorie intake  Critical care time: 65 minutes.   Pressure injury of skin Pressure Injury 08/14/22 Vertebral column Lower;Medial Stage 1 -  Intact skin with non-blanchable redness of a localized area usually over a bony prominence. (Active)  08/14/22 1552  Location: Vertebral column  Location Orientation: Lower;Medial  Staging: Stage 1 -  Intact skin with non-blanchable redness of a localized area usually over a bony prominence.  Wound Description (Comments):   Present on Admission: Yes    Code Status: Full code  Family Communication: Updated family members at bedside on 08/20/2022 and 08/21/22  Disposition Plan: Plan is to discharge to SNF   Consultants: surgery  DVT prophylaxis: Subcu Lovenox daily  Status is: Inpatient The patient requires at least 2 midnights for further evaluation and treatment of present condition.  Objective: Vitals:   08/23/22 2037 08/23/22 2233 08/24/22 0338 08/24/22 0817  BP: (!) 158/63  (!) 157/83 138/88  Pulse: 65   65  Resp: '17  18 16  '$ Temp: 98.6 F (37 C)  98.4 F (36.9 C) (!) 97.4 F (36.3 C)  TempSrc: Oral  Oral Oral  SpO2: 93% 95% 98% 96%  Weight:        Intake/Output Summary (Last 24 hours) at 08/24/2022 0819 Last data filed at 08/24/2022 0500 Gross per 24 hour  Intake 480 ml  Output 1650 ml  Net -1170 ml    Filed Weights    08/18/22 0914 08/19/22 0814 08/20/22 0846  Weight: 62.7 kg 63 kg 58.8 kg    Exam:  General: frail-appearing in no acute distress.  She is alert and oriented x 3. Cardiovascular: Regular rate and rhythm no rubs or gallops.  Respiratory: regular effort. Clear bilaterally with mild wheezing in RLL Abdomen: Soft noted normal bowel sounds present.  Musculoskeletal: No lower extremity edema bilaterally.  Skin: No ulcerative lesions noted or rashes, Psychiatry: Mood is appropriate for condition and setting.  Data Reviewed: CBC: Recent Labs  Lab 08/18/22 0438 08/19/22 0213 08/21/22 0404 08/23/22 0528 08/24/22 0449  WBC 9.6 10.0 8.7 7.0 7.1  HGB 10.0* 10.7* 10.9* 9.4* 9.9*  HCT 31.4* 32.3* 33.1* 28.3* 29.5*  MCV 107.5* 104.9* 104.4* 104.0* 104.2*  PLT 254 268 327 381 412*    Basic Metabolic Panel: Recent Labs  Lab 08/19/22 0213 08/21/22 0404 08/22/22 0458 08/23/22 0528 08/24/22 0449  NA 136 138 136 137 137  K 3.6 3.3* 4.1 4.7 4.0  CL 103 96* 94* 95* 93*  CO2 24 31 32 34* 32  GLUCOSE 100* 89 99 95 94  BUN '18 21 23 '$ 30* 25*  CREATININE 0.73 0.80 0.88 1.07* 0.96  CALCIUM 9.1 9.0 8.8* 8.7* 8.8*  MG  --  1.5* 1.8 2.1  --   PHOS  --   --  2.5  --   --  BNP (last 3 results) Recent Labs    02/27/22 1106  PROBNP 420.0*    Studies: DG Chest Port 1 View  Result Date: 08/21/2022 CLINICAL DATA:  Hypoxia, recent right rib fractures EXAM: PORTABLE CHEST 1 VIEW COMPARISON:  08/19/2022 FINDINGS: Single frontal view of the chest demonstrates a stable cardiac silhouette. Diffuse interstitial prominence is again noted, with slight improvement in the bibasilar ground-glass airspace disease seen previously. Multiple right-sided rib fractures are again noted, with stable right pleural effusion and right basilar consolidation. Small left pleural effusion is unchanged. No pneumothorax. IMPRESSION: 1. Stable right-sided rib fractures and moderate right pleural effusion. No pneumothorax. 2.  Persistent but improving interstitial and ground-glass opacities throughout the lungs, which may reflect improving volume status. 3. Small left pleural effusion. Electronically Signed   By: Randa Ngo M.D.   On: 08/21/2022 19:22   ECHOCARDIOGRAM COMPLETE  Result Date: 08/21/2022    ECHOCARDIOGRAM REPORT   Patient Name:   [name] Date of Exam: 08/21/2022 Medical Rec #:  [id number]          Height:       67.0 in Accession #:    [id number]         Weight:       129.6 lb Date of Birth:  December 27, 1940          BSA:          1.682 m Patient Age:    13 years           BP:           132/79 mmHg Patient Gender: F                  HR:           75 bpm. Exam Location:  [facility] Procedure: 2D Echo, Cardiac Doppler and Color Doppler Indications:     CHF-acute diastolic O37.85  History:         Patient has prior history of Echocardiogram examinations, most                  recent 12/29/2021. CHF, Arrythmias:Atrial Fibrillation; Risk                  Factors:Hypertension.  Sonographer:     Sherrie Sport Referring Phys:  [id number] [place] Diagnosing Phys: Ida Rogue MD  Sonographer Comments: Suboptimal apical window. IMPRESSIONS  1. Left ventricular ejection fraction, by estimation, is 55 to 60%. The left ventricle has normal function. The left ventricle has no regional wall motion abnormalities. There is mild left ventricular hypertrophy. Left ventricular diastolic parameters are indeterminate.  2. Right ventricular systolic function is normal. The right ventricular size is normal.  3. The mitral valve is normal in structure. Mild mitral valve regurgitation. No evidence of mitral stenosis.  4. The aortic valve is normal in structure. Aortic valve regurgitation is not visualized. No aortic stenosis is present.  5. The inferior vena cava is normal in size with greater than 50% respiratory variability, suggesting right atrial pressure of 3 mmHg. FINDINGS  Left Ventricle: Left ventricular ejection fraction, by estimation,  is 55 to 60%. The left ventricle has normal function. The left ventricle has no regional wall motion abnormalities. The left ventricular internal cavity size was normal in size. There is  mild left ventricular hypertrophy. Left ventricular diastolic parameters are indeterminate. Right Ventricle: The right ventricular size is normal. No increase in right ventricular wall thickness. Right ventricular systolic function is  normal. Left Atrium: Left atrial size was normal in size. Right Atrium: Right atrial size was normal in size. Pericardium: There is no evidence of pericardial effusion. Mitral Valve: The mitral valve is normal in structure. There is mild thickening of the mitral valve leaflet(s). Mild mitral valve regurgitation. No evidence of mitral valve stenosis. Tricuspid Valve: The tricuspid valve is normal in structure. Tricuspid valve regurgitation is mild . No evidence of tricuspid stenosis. Aortic Valve: The aortic valve is normal in structure. Aortic valve regurgitation is not visualized. No aortic stenosis is present. Aortic valve mean gradient measures 3.0 mmHg. Aortic valve peak gradient measures 5.2 mmHg. Aortic valve area, by VTI measures 2.34 cm. Pulmonic Valve: The pulmonic valve was normal in structure. Pulmonic valve regurgitation is not visualized. No evidence of pulmonic stenosis. Aorta: The aortic root is normal in size and structure. Venous: The inferior vena cava is normal in size with greater than 50% respiratory variability, suggesting right atrial pressure of 3 mmHg. IAS/Shunts: No atrial level shunt detected by color flow Doppler.  LEFT VENTRICLE PLAX 2D LVIDd:         3.65 cm   Diastology LVIDs:         2.70 cm   LV e' medial:    6.96 cm/s LV PW:         1.00 cm   LV E/e' medial:  14.8 LV IVS:        1.00 cm   LV e' lateral:   10.80 cm/s LVOT diam:     1.90 cm   LV E/e' lateral: 9.5 LV SV:         48 LV SV Index:   29 LVOT Area:     2.84 cm  RIGHT VENTRICLE RV Basal diam:  3.30 cm RV Mid  diam:    2.90 cm RV S prime:     12.90 cm/s TAPSE (M-mode): 2.2 cm LEFT ATRIUM             Index        RIGHT ATRIUM           Index LA diam:        3.10 cm 1.84 cm/m   RA Area:     17.00 cm LA Vol (A2C):   29.9 ml 17.78 ml/m  RA Volume:   46.40 ml  27.59 ml/m LA Vol (A4C):   36.6 ml 21.76 ml/m LA Biplane Vol: 36.8 ml 21.88 ml/m  AORTIC VALVE AV Area (Vmax):    2.04 cm AV Area (Vmean):   2.02 cm AV Area (VTI):     2.34 cm AV Vmax:           114.00 cm/s AV Vmean:          79.700 cm/s AV VTI:            0.206 m AV Peak Grad:      5.2 mmHg AV Mean Grad:      3.0 mmHg LVOT Vmax:         82.20 cm/s LVOT Vmean:        56.700 cm/s LVOT VTI:          0.170 m LVOT/AV VTI ratio: 0.83  AORTA Ao Root diam: 3.10 cm MITRAL VALVE                TRICUSPID VALVE MV Area (PHT): 5.50 cm     TR Peak grad:   14.3 mmHg MV Decel Time: 138 msec  TR Vmax:        189.00 cm/s MV E velocity: 103.00 cm/s MV A velocity: 50.30 cm/s   SHUNTS MV E/A ratio:  2.05         Systemic VTI:  0.17 m                             Systemic Diam: 1.90 cm Ida Rogue MD Electronically signed by Ida Rogue MD Signature Date/Time: 08/21/2022/2:33:34 PM    Final      Scheduled Meds:  acetaminophen  1,000 mg Oral Q6H   allopurinol  150 mg Oral Daily   amiodarone  200 mg Oral BID   cholecalciferol  2,000 Units Oral Daily   enoxaparin (LOVENOX) injection  40 mg Subcutaneous L27N   folic acid  1 mg Oral Daily   gabapentin  100 mg Oral TID   lidocaine  1 patch Transdermal Q24H   magnesium oxide  400 mg Oral BID   methocarbamol  750 mg Oral Q8H   mirtazapine  30 mg Oral QHS   multivitamin with minerals  1 tablet Oral Daily   potassium chloride  20 mEq Oral BID   thiamine  100 mg Oral Daily   Or   thiamine  100 mg Intravenous Daily    Continuous Infusions:  cefTRIAXone (ROCEPHIN)  IV Stopped (08/23/22 1045)     LOS: 10 days   Richarda Osmond, MD [facility] Pager [phone]  If 7PM-7AM, please contact  night-coverage [email or web address] Password [facility] 08/24/2022, 8:19 AM

## 2022-08-24 NOTE — Progress Notes (Addendum)
Occupational Therapy Treatment Patient Details Name: [name] MRN: [id number] DOB: 1941-07-12 Today's Date: 08/24/2022   History of present illness Pt is an 82 y.o. female presenting to hospital 08/14/22 s/p fall with R rib pain.  Imaging showing mildly displaced acute fx's R 3rd-10th ribs (5th rib fx'd in 2 places); small thoracic vertebral transverse process tip fx's T7-T9, and small R hemo-pneumothorax; also chronic L sided rib fx.  Pt admitted with hemopneumothrax and multiple rib fx's.  No current indication for surgery or chest tube.  PMH includes CHF, alcohol abuse, L breast CA s/p lumpectomy, CAD, closed R hip fx 09/20/19, htn, osteopenia, persistent a-fib, psoriasis, wide complex tachycardia, orthostatic hypotension, CKD, MGUS, chronic neck pain and low back pain, lymphedema, L ankle fx sx, cardioversion, carotid endarterectomy L shoulder sx, R THA.   OT comments  Upon entering session, pt sitting up in recliner and agreeable to OT. Pt endorsing her bottom is starting to hurt from sitting in chair and requested to return to bed. She required CGA-Min A to stand from recliner and Min guard for step pivot transfer to EOB using RW. She required Min guard for safety to return to supine. On 4L O2 via [place] t/o and VSS. Pt with improved alertness, strength, balance, and activity tolerance compared to last session. Although pt with improvements in overall function this date, she still remains below baseline. She would continue to benefit from SNF upon discharge prior to return home. OT will continue to follow acutely.    Recommendations for follow up therapy are one component of a multi-disciplinary discharge planning process, led by the attending physician.  Recommendations may be updated based on patient status, additional functional criteria and insurance authorization.    Follow Up Recommendations  Skilled nursing-short term rehab (<3 hours/day)     Assistance Recommended at Discharge  Frequent or constant Supervision/Assistance  Patient can return home with the following  A little help with walking and/or transfers;A little help with bathing/dressing/bathroom;Assistance with cooking/housework;Assist for transportation;Help with stairs or ramp for entrance   Equipment Recommendations  Other (comment) (defer to next venue of care)    Recommendations for Other Services      Precautions / Restrictions Precautions Precautions: Fall Precaution Comments: R sided rib fx's; monitor O2 Restrictions Weight Bearing Restrictions: No       Mobility Bed Mobility Overal bed mobility: Needs Assistance Bed Mobility: Sit to Supine       Sit to supine: Min guard        Transfers Overall transfer level: Needs assistance Equipment used: Rolling walker (2 wheels) Transfers: Sit to/from Stand, Bed to chair/wheelchair/BSC Sit to Stand: Min guard, Min assist (from recliner)     Step pivot transfers: Min guard           Balance Overall balance assessment: Needs assistance, History of Falls Sitting-balance support: Feet supported Sitting balance-Leahy Scale: Good     Standing balance support: Bilateral upper extremity supported, During functional activity, Reliant on assistive device for balance Standing balance-Leahy Scale: Fair                             ADL either performed or assessed with clinical judgement   ADL Overall ADL's : Needs assistance/impaired  General ADL Comments: Pt deferred toileting and grooming tasks this date.    Extremity/Trunk Assessment Upper Extremity Assessment Upper Extremity Assessment: Generalized weakness   Lower Extremity Assessment Lower Extremity Assessment: Generalized weakness   Cervical / Trunk Assessment Cervical / Trunk Assessment: Normal    Vision Patient Visual Report: No change from baseline     Perception     Praxis      Cognition  Arousal/Alertness: Awake/alert Behavior During Therapy: WFL for tasks assessed/performed Overall Cognitive Status: Within Functional Limits for tasks assessed                                          Exercises      Shoulder Instructions       General Comments      Pertinent Vitals/ Pain       Pain Assessment Pain Assessment: No/denies pain  Home Living                                          Prior Functioning/Environment              Frequency  Min 2X/week        Progress Toward Goals  OT Goals(current goals can now be found in the care plan section)  Progress towards OT goals: Progressing toward goals  Acute Rehab OT Goals Patient Stated Goal: to improve pain and breathing OT Goal Formulation: With patient Time For Goal Achievement: 08/31/22 Potential to Achieve Goals: Good  Plan Discharge plan remains appropriate;Frequency remains appropriate    Co-evaluation                 AM-PAC OT "6 Clicks" Daily Activity     Outcome Measure   Help from another person eating meals?: None Help from another person taking care of personal grooming?: A Little Help from another person toileting, which includes using toliet, bedpan, or urinal?: A Lot Help from another person bathing (including washing, rinsing, drying)?: A Lot Help from another person to put on and taking off regular upper body clothing?: A Little Help from another person to put on and taking off regular lower body clothing?: A Little 6 Click Score: 17    End of Session Equipment Utilized During Treatment: Gait belt;Rolling walker (2 wheels);Oxygen  OT Visit Diagnosis: Other abnormalities of gait and mobility (R26.89);Muscle weakness (generalized) (M62.81)   Activity Tolerance Patient tolerated treatment well   Patient Left in bed;with call bell/phone within reach;with bed alarm set   Nurse Communication Mobility status        Time: 4008-6761 OT  Time Calculation (min): 12 min  Charges: OT General Charges $OT Visit: 1 Visit OT Treatments $Therapeutic Activity: 8-22 mins  [facility] MS, OTR/L ascom [phone]  08/24/22, 5:47 PM

## 2022-08-25 ENCOUNTER — Inpatient Hospital Stay: Payer: Medicare Other

## 2022-08-25 ENCOUNTER — Telehealth: Payer: Self-pay | Admitting: Cardiology

## 2022-08-25 DIAGNOSIS — R636 Underweight: Secondary | ICD-10-CM | POA: Diagnosis not present

## 2022-08-25 DIAGNOSIS — Z66 Do not resuscitate: Secondary | ICD-10-CM

## 2022-08-25 DIAGNOSIS — S2241XA Multiple fractures of ribs, right side, initial encounter for closed fracture: Secondary | ICD-10-CM | POA: Diagnosis not present

## 2022-08-25 DIAGNOSIS — R52 Pain, unspecified: Secondary | ICD-10-CM

## 2022-08-25 DIAGNOSIS — S2242XK Multiple fractures of ribs, left side, subsequent encounter for fracture with nonunion: Secondary | ICD-10-CM | POA: Diagnosis not present

## 2022-08-25 DIAGNOSIS — S298XXA Other specified injuries of thorax, initial encounter: Secondary | ICD-10-CM | POA: Diagnosis not present

## 2022-08-25 DIAGNOSIS — W19XXXA Unspecified fall, initial encounter: Secondary | ICD-10-CM | POA: Diagnosis not present

## 2022-08-25 DIAGNOSIS — J9601 Acute respiratory failure with hypoxia: Secondary | ICD-10-CM

## 2022-08-25 DIAGNOSIS — J942 Hemothorax: Secondary | ICD-10-CM | POA: Diagnosis not present

## 2022-08-25 LAB — BASIC METABOLIC PANEL
Anion gap: 8 (ref 5–15)
BUN: 24 mg/dL — ABNORMAL HIGH (ref 8–23)
CO2: 34 mmol/L — ABNORMAL HIGH (ref 22–32)
Calcium: 9 mg/dL (ref 8.9–10.3)
Chloride: 95 mmol/L — ABNORMAL LOW (ref 98–111)
Creatinine, Ser: 1.12 mg/dL — ABNORMAL HIGH (ref 0.44–1.00)
GFR, Estimated: 49 mL/min — ABNORMAL LOW (ref >60)
Glucose, Bld: 98 mg/dL (ref 70–99)
Potassium: 4 mmol/L (ref 3.5–5.1)
Sodium: 137 mmol/L (ref 135–145)

## 2022-08-25 LAB — CBC
HCT: 30.4 % — ABNORMAL LOW (ref 36.0–46.0)
Hemoglobin: 9.7 g/dL — ABNORMAL LOW (ref 12.0–15.0)
MCH: 33.4 pg (ref 26.0–34.0)
MCHC: 31.9 g/dL (ref 30.0–36.0)
MCV: 104.8 fL — ABNORMAL HIGH (ref 80.0–100.0)
Platelets: 458 10*3/uL — ABNORMAL HIGH (ref 150–400)
RBC: 2.9 MIL/uL — ABNORMAL LOW (ref 3.87–5.11)
RDW: 13.8 % (ref 11.5–15.5)
WBC: 7.4 10*3/uL (ref 4.0–10.5)
nRBC: 0 % (ref 0.0–0.2)

## 2022-08-25 MED ORDER — APIXABAN 2.5 MG PO TABS
2.5000 mg | ORAL_TABLET | Freq: Two times a day (BID) | ORAL | Status: DC
  Administered 2022-08-25 – 2022-08-26 (×3): 2.5 mg via ORAL
  Filled 2022-08-25 (×3): qty 1

## 2022-08-25 NOTE — Progress Notes (Addendum)
..  PROGRESS NOTE  DNYA HICKLE GEZ:[id number] DOB: [birth year] DOA: 08/14/2022 PCP: [name], MD  HPI/Recap of past 24 hours: [name] is a 82 y.o. female with medical history significant of permanent A-fib on Eliquis, alcohol abuse, hypertension, CKD-3a, hyperlipidemia, diastolic CHF, gout, depression with anxiety, MGUS, psoriasis, left breast cancer, anemia, who presented on 08/14/2022 for evaluation after a mechanical fall and subsequent severe right rib cage pain.  She denied hitting head or LOC, reportedly declined CT's of head and neck.   ED Course: pt was found to have hemoglobin 10.7 (12.4 on 08/14/2022), stable renal function, afebrile and stable vitals with BP low/normal.     Imaging revealed right-sided lateral rib fractures of ribs 5-8, a small right apical pneumothorax (5% volume), right base opacity (atelectasis vs PNA), COPD chronic changes.  Also has chronic left-sided rib fractures.  Moderate left-side pleural effusion.  CT chest/abdomen/pelvis showed small volume right hemo-pneumothorax and pulmonary contusions. Rib fractures noted 3-10, with 5th rib fractured in two places.  Also small T7-T9 transverse process fractures.     Patient was admitted to medicine service with surgery consulted.   No chest tube has been required thus far, and no indication for surgical intervention.     1/27: repeat CXR, stable R apical PTX of ~5%, increased right pleural fluid. 1/29: no residual PTX on CXR today.  Stable but pain remains uncontrolled.   1/30: increased O2 needs overnight.  Started antibiotic, given a dose IV Lasix.  Suspect some fluid overload and ?developing pneumonia. 1/31: ongoing work of breathing, tachypnea, on NRB mask borderline/low sats with minimal exertion. Further diuresis.  2/1: On nonrebreather.  Personally reviewed chest x-ray done on 1/31 which showed bilateral pleural effusions with bibasilar opacities right greater than left, concerning for  underlying pneumonia.  On Rocephin.  Started diuresing with IV Lasix.placed on HFNC  2/2-2/5: patient having very slow and gradual improvement in respiratory status. Now transitioned from HFNC to regular [place] on 5L.  2/6: patient continues to have improvement in respiratory status. Now on 3L [place]. Adherent with frequent incentive spirometer use. Normal WOB. Repeat chest xray showed improvement from previous. Eliquis restarting today. If remains stable, will likely be able to dc to SNF tomorrow on oxygen. She denies significant dyspnea or cough. She is anxious to be discharged as soon as possible.   Assessment/Plan: Principal Problem:   Multiple rib fractures Active Problems:   Hypokalemia   Hemopneumothorax   Lobar pneumonia (HCC)   Acute respiratory failure with hypoxia (HCC)   Bilateral pleural effusion   HTN (hypertension)   Chronic diastolic CHF (congestive heart failure) (HCC)   Chronic kidney disease, stage 3a (HCC)   Macrocytic anemia   AKI (acute kidney injury) ([place])   Gout   Depression with anxiety   Alcohol use   Atrial fibrillation, chronic (HCC)   Blunt injury to chest   Fall   Underweight   Pressure injury of skin   Hemothorax on right  Multiple rib fractures See imaging reports.  On CT scan, right ribs 3 through 10 are fractured, with 5th rib two fractures.  Also T7-T9 transverse process fractures. --Pain control - scheduled Tylenol and PRN oxycodone Encourage use of incentive spirometer.  Acute respiratory failure with hypoxia (HCC) Lobar pneumonia Transitioned from HFNC to  5L and gradual improvement to 3L today. Normal WOB and no conversational dyspnea.   - Incentive spirometer - continue duonebs for mild wheezing - Completed Rocephin x5 day - PT//OT  recommending SNF at dc. TOC engaged  Acute on chronic diastolic CHF Bilateral pleural effusion- CTAB on exam today. Euvolemic on exam. BNP 522 on admission. Chest x-ray with bilateral pleural effusions 2D echo  with normal LVEF - Continue strict I's and O's and daily weight  Hemopneumothorax- Due to multiple rib fractures. Hemoglobin stable. And chest xray stable/improved.  Surgery signed off - Home Eliquis restarted today - cbc am  Hypokalemia- Resolved, post repletion - monitor and replete PRN   AKI (acute kidney injury) ([place]) Resolved Avoid nephrotoxic agents and hypotension   Macrocytic anemia- stable. Hbg 12.4 on 08/14/22. Hgb this admission moderately stable around 10. 19.2>10.9>>9.9>9.7 - CBC am since restarting eliquis today   HTN (hypertension) BPs are at goal. Continue to monitor vital signs   Atrial fibrillation, chronic (HCC) Rate controlled on amiodarone - restart eliquis   Alcohol use disorder No withdrawal signs.   Depression with anxiety Continue home regimen   Gout Continue allopurinol   Fall With multiple R rib fractures and hemopneumothorax.  Pt declined CT head or neck on admission and denied striking her head or LOC. --PT/OT evaluations, continue --SNF recommended --TOC following for placement --Fall precautions, continue   Underweight Body mass index is 17.96 kg/m. Encourage oral protein calorie intake  Critical care time: 65 minutes.   Pressure injury of skin Pressure Injury 08/14/22 Vertebral column Lower;Medial Stage 1 -  Intact skin with non-blanchable redness of a localized area usually over a bony prominence. (Active)  08/14/22 1552  Location: Vertebral column  Location Orientation: Lower;Medial  Staging: Stage 1 -  Intact skin with non-blanchable redness of a localized area usually over a bony prominence.  Wound Description (Comments):   Present on Admission: Yes    Code Status: Full code  Family Communication: Updated family members at bedside on 08/20/2022 and 08/21/22  Disposition Plan: Plan is to discharge to SNF  Consultants: surgery  DVT prophylaxis: Subcu Lovenox daily  Status is: Inpatient The patient requires at least 2  midnights for further evaluation and treatment of present condition.  Objective: Vitals:   08/24/22 2100 08/24/22 2339 08/25/22 0428 08/25/22 0804  BP:  (!) 128/46 (!) 167/80 (!) 151/66  Pulse:  (!) 56 64 64  Resp: (!) '22 16 20 18  '$ Temp:  98.1 F (36.7 C) 97.6 F (36.4 C) 98.2 F (36.8 C)  TempSrc:  Oral Oral   SpO2:  99% 100% 100%  Weight:        Intake/Output Summary (Last 24 hours) at 08/25/2022 0807 Last data filed at 08/25/2022 0433 Gross per 24 hour  Intake 1080 ml  Output 2100 ml  Net -1020 ml    Filed Weights   08/18/22 0914 08/19/22 0814 08/20/22 0846  Weight: 62.7 kg 63 kg 58.8 kg    Exam:  General: frail-appearing in no acute distress.  She is alert and oriented x 3. Cardiovascular: Regular rate and rhythm no rubs or gallops.  Respiratory: regular effort. Clear bilaterally without wheezing or rhales Abdomen: Soft, non tender Musculoskeletal: No lower extremity edema bilaterally.  Skin: No ulcerative lesions noted or rashes, Psychiatry: Mood is appropriate for condition and setting.  Data Reviewed: CBC: Recent Labs  Lab 08/19/22 0213 08/21/22 0404 08/23/22 0528 08/24/22 0449 08/25/22 0619  WBC 10.0 8.7 7.0 7.1 7.4  HGB 10.7* 10.9* 9.4* 9.9* 9.7*  HCT 32.3* 33.1* 28.3* 29.5* 30.4*  MCV 104.9* 104.4* 104.0* 104.2* 104.8*  PLT 268 327 381 412* 458*    Basic Metabolic Panel: Recent  Labs  Lab 08/21/22 0404 08/22/22 0458 08/23/22 0528 08/24/22 0449 08/25/22 0619  NA 138 136 137 137 137  K 3.3* 4.1 4.7 4.0 4.0  CL 96* 94* 95* 93* 95*  CO2 31 32 34* 32 34*  GLUCOSE 89 99 95 94 98  BUN 21 23 30* 25* 24*  CREATININE 0.80 0.88 1.07* 0.96 1.12*  CALCIUM 9.0 8.8* 8.7* 8.8* 9.0  MG 1.5* 1.8 2.1  --   --   PHOS  --  2.5  --   --   --     BNP (last 3 results) Recent Labs    02/27/22 1106  PROBNP 420.0*    Studies: DG Chest Port 1 View  Result Date: 08/25/2022 CLINICAL DATA:  Shortness of breath. EXAM: PORTABLE CHEST 1 VIEW COMPARISON:  Chest  x-ray 08/21/2022, 08/14/2022, 05/08/2022. CT 08/14/2022. FINDINGS: Mediastinum and hilar structures normal. Cardiomegaly. No pulmonary venous congestion noted. Diffuse bilateral interstitial prominence and bilateral pleural effusions noted suggesting CHF. Pneumonitis cannot be excluded. A component of chronic underlying interstitial disease cannot be excluded. No pneumothorax noted. Multiple acute right rib fractures again noted. Multiple old left rib fractures are again noted. Degenerative changes scoliosis thoracic spine. IMPRESSION: 1. Cardiomegaly. Diffuse bilateral interstitial prominence and bilateral pleural effusions noted suggesting CHF. Pneumonitis cannot be excluded. A component of chronic underlying interstitial disease cannot be excluded. 2. Multiple acute right rib fractures again noted. Multiple old left rib fractures again noted. No pneumothorax. Electronically Signed   By: Marcello Moores  Register M.D.   On: 08/25/2022 07:28   DG Chest Port 1 View  Result Date: 08/21/2022 CLINICAL DATA:  Hypoxia, recent right rib fractures EXAM: PORTABLE CHEST 1 VIEW COMPARISON:  08/19/2022 FINDINGS: Single frontal view of the chest demonstrates a stable cardiac silhouette. Diffuse interstitial prominence is again noted, with slight improvement in the bibasilar ground-glass airspace disease seen previously. Multiple right-sided rib fractures are again noted, with stable right pleural effusion and right basilar consolidation. Small left pleural effusion is unchanged. No pneumothorax. IMPRESSION: 1. Stable right-sided rib fractures and moderate right pleural effusion. No pneumothorax. 2. Persistent but improving interstitial and ground-glass opacities throughout the lungs, which may reflect improving volume status. 3. Small left pleural effusion. Electronically Signed   By: Randa Ngo M.D.   On: 08/21/2022 19:22     Scheduled Meds:  acetaminophen  1,000 mg Oral Q6H   allopurinol  150 mg Oral Daily   amiodarone   200 mg Oral BID   cholecalciferol  2,000 Units Oral Daily   enoxaparin (LOVENOX) injection  40 mg Subcutaneous F02D   folic acid  1 mg Oral Daily   gabapentin  100 mg Oral TID   ipratropium-albuterol  3 mL Nebulization BID   lidocaine  1 patch Transdermal Q24H   magnesium oxide  400 mg Oral BID   methocarbamol  750 mg Oral Q8H   mirtazapine  30 mg Oral QHS   multivitamin with minerals  1 tablet Oral Daily   potassium chloride  20 mEq Oral BID   thiamine  100 mg Oral Daily   Or   thiamine  100 mg Intravenous Daily    Continuous Infusions:     LOS: 11 days   Richarda Osmond, MD [facility] Pager [phone]  If 7PM-7AM, please contact night-coverage [email or web address] Password [email or web address] 08/25/2022, 8:07 AM

## 2022-08-25 NOTE — Progress Notes (Signed)
Palliative Care Progress Note, Assessment & Plan   Patient Name: [name]       Date: 08/25/2022 DOB: Sep 08, 1940  Age: 82 y.o. MRN#: [id number] Attending Physician: Richarda Osmond, MD Primary Care Physician: [name], MD Admit Date: 08/14/2022  Subjective: Patient is out of bed and sitting in the recliner.  She is acknowledges my presence and is able to make her wishes known.  Her son [name] is at bedside.  HPI: [name] is a 82 y.o. female with medical history significant of A-fib on Eliquis, alcohol abuse, hypertension, CKD-3a, hyperlipidemia, diastolic CHF, gout, depression with anxiety, MGUS, psoriasis, left breast cancer, anemia, who presented on 08/14/2022 for evaluation after a mechanical fall and subsequent severe right rib cage pain.  She denied hitting head or LOC, reportedly declined CT's of head and neck.   ED Course: pt was found to have hemoglobin 10.7 (12.4 on 08/14/2022), stable renal function, afebrile and stable vitals with BP low/normal.     Imaging revealed right-sided lateral rib fractures of ribs 3, 5-8, 10 a small right apical pneumothorax, hemothorax, bilateral opacities.  Also has chronic left-sided rib fractures. Moderate left-side pleural effusion.  Summary of counseling/coordination of care: After reviewing the patient's chart and assessing the patient at bedside, I spoke with patient and her son [name] at bedside in regards to plan and goals of care.  I highlighted discussions that have had with patient previously in regards to advance care planning and CODE STATUS.  Patient had conveyed in previous discussions that she would want to be a DNR/DNI.  I confirmed patient's wishes.  Son at bedside was in agreement.  They both felt patient already had a DNR in  place.  CODE STATUS changed to DNR/DNI and attending Dr. Ouida Sills made aware.  [place] form completed and placed in patient's shadow chart.  Also discussed that patient has plans to go to a skilled nursing facility for rehab at discharge.  Center due to the copy of a MOST form as further documentations of patient's wishes and goals.  I completed a MOST form today. The patient and family outlined their wishes for the following treatment decisions:  Cardiopulmonary Resuscitation: Do Not Attempt Resuscitation (DNR/No CPR)  Medical Interventions: Limited Additional Interventions: Use medical treatment, IV fluids and cardiac monitoring as indicated, DO NOT USE intubation or mechanical ventilation. May consider use of less invasive airway support such as BiPAP or CPAP. Also provide comfort measures. Transfer to the hospital if indicated. Avoid intensive care.   Antibiotics: Antibiotics if indicated  IV Fluids: IV fluids if indicated  Feeding Tube: No feeding tube    MOST form signed by patient and myself.  Copy given to family and original placed in patient's shadow chart.  PMT contact information given to patient and son.  We discussed patient's pain.  She shares she has not had pain medication in quite some time and is experiencing pain in her rib with movement.  Requested pain medicine be given from RN.  Oxy IR as needed given.  Patient also complained of tenderness on her rectum.  Discussed importance of continuous movement to avoid skin breakdown.  Allevyn foam pad placed to sacrum to  prevent skin breakdown.  Questions and concerns were addressed.  PMT will continue to follow and support the patient/family throughout her hospitalization.  Physical Exam Constitutional:      General: She is not in acute distress.    Appearance: She is normal weight. She is not ill-appearing.  Eyes:     Pupils: Pupils are equal, round, and reactive to light.  Cardiovascular:     Rate and Rhythm: Normal  rate.     Pulses: Normal pulses.  Pulmonary:     Effort: Pulmonary effort is normal.  Abdominal:     Palpations: Abdomen is soft.  Musculoskeletal:        General: Normal range of motion.     Comments: Generalized weakness but MAETC  Skin:    General: Skin is warm and dry.  Neurological:     Mental Status: She is alert and oriented to person, place, and time.  Psychiatric:        Mood and Affect: Mood normal.        Behavior: Behavior normal.        Thought Content: Thought content normal.        Judgment: Judgment normal.             Total Time 50 minutes   [name] L. Ilsa Iha, FNP-BC Palliative Medicine Team Team Phone # [phone]

## 2022-08-25 NOTE — Telephone Encounter (Signed)
Patient's son called to let Dr. Quentin Ore and nurse know that patient was in hospital.

## 2022-08-25 NOTE — Progress Notes (Signed)
Physical Therapy Treatment Patient Details Name: [name] MRN: [id number] DOB: 1941/05/07 Today's Date: 08/25/2022   History of Present Illness Pt is an 82 y.o. female presenting to hospital 08/14/22 s/p fall with R rib pain.  Imaging showing mildly displaced acute fx's R 3rd-10th ribs (5th rib fx'd in 2 places); small thoracic vertebral transverse process tip fx's T7-T9, and small R hemo-pneumothorax; also chronic L sided rib fx.  Pt admitted with hemopneumothrax and multiple rib fx's.  No current indication for surgery or chest tube.  PMH includes CHF, alcohol abuse, L breast CA s/p lumpectomy, CAD, closed R hip fx 09/20/19, htn, osteopenia, persistent a-fib, psoriasis, wide complex tachycardia, orthostatic hypotension, CKD, MGUS, chronic neck pain and low back pain, lymphedema, L ankle fx sx, cardioversion, carotid endarterectomy L shoulder sx, R THA.    PT Comments    Pt in chair.  Ready to walk.  Stood with min guard/assist with cues for hand placements.  She is able to walk 60' x 1 and 50' x 1 with RW and slow generally unsteady gait.  She does report feeling wobbly and is limited by fatigue but overall progressing.  Sats with poor readings during gait 68 but pt does not report SOB, appears good,  and stated reading have been inaccurate at times.  Pt given increased time to rest during walks as a precaution.  Reads 94% once sitting in recliner after gait.  3 lpm O2.   Recommendations for follow up therapy are one component of a multi-disciplinary discharge planning process, led by the attending physician.  Recommendations may be updated based on patient status, additional functional criteria and insurance authorization.  Follow Up Recommendations  Skilled nursing-short term rehab (<3 hours/day)     Assistance Recommended at Discharge Intermittent Supervision/Assistance  Patient can return home with the following A little help with walking and/or transfers;A little help with  bathing/dressing/bathroom;Assist for transportation;Assistance with cooking/housework   Equipment Recommendations  Rolling walker (2 wheels);BSC/3in1    Recommendations for Other Services       Precautions / Restrictions Precautions Precautions: Fall Precaution Comments: R sided rib fx's; monitor O2 Restrictions Weight Bearing Restrictions: No     Mobility  Bed Mobility               General bed mobility comments: NT, pt in recliner Patient Response: Cooperative  Transfers Overall transfer level: Needs assistance Equipment used: Rolling walker (2 wheels) Transfers: Sit to/from Stand Sit to Stand: Min guard, Min assist                Ambulation/Gait Ambulation/Gait assistance: Herbalist (Feet): 60 Feet Assistive device: Rolling walker (2 wheels) Gait Pattern/deviations: Decreased step length - right, Decreased step length - left, Decreased stride length, Step-through pattern, Trunk flexed Gait velocity: decreased     General Gait Details: 60' 50'  slow and pt reports feeling "wobbly" but overall does well limited by fatigue   Stairs             Wheelchair Mobility    Modified Rankin (Stroke Patients Only)       Balance Overall balance assessment: Needs assistance, History of Falls Sitting-balance support: Feet supported Sitting balance-Leahy Scale: Good     Standing balance support: Bilateral upper extremity supported, During functional activity, Reliant on assistive device for balance Standing balance-Leahy Scale: Fair Standing balance comment: +1 for safety  Cognition Arousal/Alertness: Awake/alert Behavior During Therapy: WFL for tasks assessed/performed Overall Cognitive Status: Within Functional Limits for tasks assessed                                          Exercises      General Comments        Pertinent Vitals/Pain Pain Assessment Pain  Assessment: Faces Faces Pain Scale: Hurts a little bit Pain Location: coccyx from sitting too much Pain Descriptors / Indicators: Discomfort, Sore Pain Intervention(s): Monitored during session, Repositioned    Home Living                          Prior Function            PT Goals (current goals can now be found in the care plan section) Progress towards PT goals: Progressing toward goals    Frequency    Min 2X/week      PT Plan Current plan remains appropriate    Co-evaluation              AM-PAC PT "6 Clicks" Mobility   Outcome Measure  Help needed turning from your back to your side while in a flat bed without using bedrails?: A Little Help needed moving from lying on your back to sitting on the side of a flat bed without using bedrails?: A Little Help needed moving to and from a bed to a chair (including a wheelchair)?: A Little Help needed standing up from a chair using your arms (e.g., wheelchair or bedside chair)?: A Little Help needed to walk in hospital room?: A Little Help needed climbing 3-5 steps with a railing? : A Lot 6 Click Score: 17    End of Session Equipment Utilized During Treatment: Gait belt;Oxygen Activity Tolerance: Patient tolerated treatment well Patient left: with call bell/phone within reach;in chair;with chair alarm set Nurse Communication: Mobility status PT Visit Diagnosis: Other abnormalities of gait and mobility (R26.89);Muscle weakness (generalized) (M62.81);History of falling (Z91.81);Pain Pain - Right/Left: Right     Time: 8338-2505 PT Time Calculation (min) (ACUTE ONLY): 18 min  Charges:  $Gait Training: 8-22 mins                   Chesley Noon, PTA 08/25/22, 2:13 PM

## 2022-08-25 NOTE — Plan of Care (Signed)
  Problem: Health Behavior/Discharge Planning: Goal: Ability to manage health-related needs will improve Outcome: Progressing   Problem: Clinical Measurements: Goal: Respiratory complications will improve Outcome: Progressing   Problem: Activity: Goal: Risk for activity intolerance will decrease Outcome: Progressing   Problem: Pain Managment: Goal: General experience of comfort will improve Outcome: Progressing   Problem: Safety: Goal: Ability to remain free from injury will improve Outcome: Progressing

## 2022-08-26 DIAGNOSIS — S2249XD Multiple fractures of ribs, unspecified side, subsequent encounter for fracture with routine healing: Secondary | ICD-10-CM | POA: Diagnosis not present

## 2022-08-26 DIAGNOSIS — E785 Hyperlipidemia, unspecified: Secondary | ICD-10-CM | POA: Diagnosis not present

## 2022-08-26 DIAGNOSIS — R531 Weakness: Secondary | ICD-10-CM | POA: Diagnosis not present

## 2022-08-26 DIAGNOSIS — R059 Cough, unspecified: Secondary | ICD-10-CM | POA: Diagnosis not present

## 2022-08-26 DIAGNOSIS — J9601 Acute respiratory failure with hypoxia: Secondary | ICD-10-CM | POA: Diagnosis not present

## 2022-08-26 DIAGNOSIS — Z743 Need for continuous supervision: Secondary | ICD-10-CM | POA: Diagnosis not present

## 2022-08-26 DIAGNOSIS — R278 Other lack of coordination: Secondary | ICD-10-CM | POA: Diagnosis not present

## 2022-08-26 DIAGNOSIS — S298XXA Other specified injuries of thorax, initial encounter: Secondary | ICD-10-CM | POA: Diagnosis not present

## 2022-08-26 DIAGNOSIS — R52 Pain, unspecified: Secondary | ICD-10-CM | POA: Diagnosis not present

## 2022-08-26 DIAGNOSIS — R296 Repeated falls: Secondary | ICD-10-CM | POA: Diagnosis not present

## 2022-08-26 DIAGNOSIS — R2689 Other abnormalities of gait and mobility: Secondary | ICD-10-CM | POA: Diagnosis not present

## 2022-08-26 DIAGNOSIS — R0602 Shortness of breath: Secondary | ICD-10-CM | POA: Diagnosis not present

## 2022-08-26 DIAGNOSIS — M6281 Muscle weakness (generalized): Secondary | ICD-10-CM | POA: Diagnosis not present

## 2022-08-26 DIAGNOSIS — J942 Hemothorax: Secondary | ICD-10-CM | POA: Diagnosis not present

## 2022-08-26 DIAGNOSIS — S270XXA Traumatic pneumothorax, initial encounter: Secondary | ICD-10-CM | POA: Diagnosis not present

## 2022-08-26 DIAGNOSIS — I5032 Chronic diastolic (congestive) heart failure: Secondary | ICD-10-CM | POA: Diagnosis not present

## 2022-08-26 DIAGNOSIS — M1990 Unspecified osteoarthritis, unspecified site: Secondary | ICD-10-CM | POA: Diagnosis not present

## 2022-08-26 DIAGNOSIS — R1312 Dysphagia, oropharyngeal phase: Secondary | ICD-10-CM | POA: Diagnosis not present

## 2022-08-26 DIAGNOSIS — J90 Pleural effusion, not elsewhere classified: Secondary | ICD-10-CM | POA: Diagnosis not present

## 2022-08-26 DIAGNOSIS — Z9181 History of falling: Secondary | ICD-10-CM | POA: Diagnosis not present

## 2022-08-26 DIAGNOSIS — I83023 Varicose veins of left lower extremity with ulcer of ankle: Secondary | ICD-10-CM | POA: Diagnosis not present

## 2022-08-26 DIAGNOSIS — S272XXA Traumatic hemopneumothorax, initial encounter: Secondary | ICD-10-CM | POA: Diagnosis not present

## 2022-08-26 DIAGNOSIS — N1831 Chronic kidney disease, stage 3a: Secondary | ICD-10-CM | POA: Diagnosis not present

## 2022-08-26 DIAGNOSIS — D72829 Elevated white blood cell count, unspecified: Secondary | ICD-10-CM | POA: Diagnosis not present

## 2022-08-26 DIAGNOSIS — L89616 Pressure-induced deep tissue damage of right heel: Secondary | ICD-10-CM | POA: Diagnosis not present

## 2022-08-26 DIAGNOSIS — S2241XA Multiple fractures of ribs, right side, initial encounter for closed fracture: Secondary | ICD-10-CM | POA: Diagnosis not present

## 2022-08-26 DIAGNOSIS — W19XXXA Unspecified fall, initial encounter: Secondary | ICD-10-CM | POA: Diagnosis not present

## 2022-08-26 DIAGNOSIS — I4811 Longstanding persistent atrial fibrillation: Secondary | ICD-10-CM | POA: Diagnosis not present

## 2022-08-26 DIAGNOSIS — I10 Essential (primary) hypertension: Secondary | ICD-10-CM | POA: Diagnosis not present

## 2022-08-26 DIAGNOSIS — J181 Lobar pneumonia, unspecified organism: Secondary | ICD-10-CM | POA: Diagnosis not present

## 2022-08-26 DIAGNOSIS — I517 Cardiomegaly: Secondary | ICD-10-CM | POA: Diagnosis not present

## 2022-08-26 DIAGNOSIS — I482 Chronic atrial fibrillation, unspecified: Secondary | ICD-10-CM | POA: Diagnosis not present

## 2022-08-26 LAB — CBC
HCT: 28.4 % — ABNORMAL LOW (ref 36.0–46.0)
Hemoglobin: 9.2 g/dL — ABNORMAL LOW (ref 12.0–15.0)
MCH: 33.8 pg (ref 26.0–34.0)
MCHC: 32.4 g/dL (ref 30.0–36.0)
MCV: 104.4 fL — ABNORMAL HIGH (ref 80.0–100.0)
Platelets: 466 10*3/uL — ABNORMAL HIGH (ref 150–400)
RBC: 2.72 MIL/uL — ABNORMAL LOW (ref 3.87–5.11)
RDW: 13.8 % (ref 11.5–15.5)
WBC: 7.8 10*3/uL (ref 4.0–10.5)
nRBC: 0 % (ref 0.0–0.2)

## 2022-08-26 LAB — BASIC METABOLIC PANEL
Anion gap: 8 (ref 5–15)
BUN: 27 mg/dL — ABNORMAL HIGH (ref 8–23)
CO2: 31 mmol/L (ref 22–32)
Calcium: 8.8 mg/dL — ABNORMAL LOW (ref 8.9–10.3)
Chloride: 97 mmol/L — ABNORMAL LOW (ref 98–111)
Creatinine, Ser: 1.16 mg/dL — ABNORMAL HIGH (ref 0.44–1.00)
GFR, Estimated: 47 mL/min — ABNORMAL LOW (ref >60)
Glucose, Bld: 107 mg/dL — ABNORMAL HIGH (ref 70–99)
Potassium: 4.3 mmol/L (ref 3.5–5.1)
Sodium: 136 mmol/L (ref 135–145)

## 2022-08-26 MED ORDER — IPRATROPIUM-ALBUTEROL 0.5-2.5 (3) MG/3ML IN SOLN: 3.0000 mL | Freq: Four times a day (QID) | RESPIRATORY_TRACT | Status: DC | PRN

## 2022-08-26 MED ORDER — SALINE SPRAY 0.65 % NA SOLN: 1.0000 | NASAL | 0 refills | Status: DC | PRN

## 2022-08-26 MED ORDER — FLUTICASONE PROPIONATE 50 MCG/ACT NA SUSP: 2.0000 | Freq: Two times a day (BID) | NASAL | 2 refills | Status: DC | PRN

## 2022-08-26 MED ORDER — OXYCODONE HCL 5 MG PO TABS: 5.0000 mg | ORAL_TABLET | Freq: Four times a day (QID) | ORAL | 0 refills | Status: AC | PRN

## 2022-08-26 MED ORDER — TRAZODONE HCL 50 MG PO TABS: 25.0000 mg | ORAL_TABLET | Freq: Every evening | ORAL | Status: DC | PRN

## 2022-08-26 MED ORDER — GABAPENTIN 100 MG PO CAPS: 100.0000 mg | ORAL_CAPSULE | Freq: Three times a day (TID) | ORAL | Status: DC

## 2022-08-26 MED ORDER — METHOCARBAMOL 750 MG PO TABS: 750.0000 mg | ORAL_TABLET | Freq: Three times a day (TID) | ORAL | Status: DC

## 2022-08-26 NOTE — Progress Notes (Signed)
                                                     Palliative Care Progress Note   Patient Name: [name]       Date: 08/26/2022 DOB: Jan 24, 1941  Age: 82 y.o. MRN#: [id number] Attending Physician: Richarda Osmond, MD Primary Care Physician: [name], MD Admit Date: 08/14/2022  Chart reviewed.   Discharge summary and discharge plan in place for today.   No acute palliative needs today.  DNR and MOST in place.   Thank you for allowing the Palliative Medicine Team to assist in the care of [name].  [name], FNP-BC Palliative Medicine Team Team Phone # [phone]  NO CHARGE

## 2022-08-26 NOTE — Plan of Care (Signed)

## 2022-08-26 NOTE — Discharge Summary (Signed)
Physician Discharge Summary  Patient: [name]:[id number] DOB: 1940-12-10   Code Status: DNR Admit date: 08/14/2022 Discharge date: 08/26/2022 Disposition: Skilled nursing facility, PT, OT, nurse aid, and RN PCP: [name], MD  Recommendations for Outpatient Follow-up:  Follow up with PCP within 1-2 weeks Regarding general hospital follow up and preventative care Recommend following up on electrolytes Patient discharged with 3L O2 and may be able to wean off of supplement with time. Please monitor pulse ox with ambulation.  Discharge Diagnoses:  Principal Problem:   Multiple rib fractures Active Problems:   Hypokalemia   Hemopneumothorax   Lobar pneumonia (HCC)   Acute respiratory failure with hypoxia (HCC)   Bilateral pleural effusion   HTN (hypertension)   Chronic diastolic CHF (congestive heart failure) (HCC)   Chronic kidney disease, stage 3a (HCC)   Macrocytic anemia   AKI (acute kidney injury) ([place])   Gout   Depression with anxiety   Alcohol use   Atrial fibrillation, chronic (HCC)   Blunt injury to chest   Fall   Underweight   Pressure injury of skin   Hemothorax on right   Intractable pain   Acute hypoxemic respiratory failure [facility] [address])  Brief Hospital Course Summary: [name] is a 82 y.o. female with medical history significant of permanent A-fib on Eliquis, alcohol abuse, hypertension, CKD-3a, hyperlipidemia, diastolic CHF, gout, depression with anxiety, MGUS, psoriasis, left breast cancer, anemia, who presented on 08/14/2022 for evaluation after a mechanical fall and subsequent severe right rib cage pain.  She denied hitting head or LOC, reportedly declined CT's of head and neck.   ED Course: pt was found to have hemoglobin 10.7 (12.4 on 08/14/2022), stable renal function, afebrile and stable vitals with BP low/normal.     Imaging revealed right-sided lateral rib fractures of ribs 5-8, a small right apical pneumothorax (5% volume),  right base opacity (atelectasis vs PNA), COPD chronic changes.  Also has chronic left-sided rib fractures.  Moderate left-side pleural effusion.  CT chest/abdomen/pelvis showed small volume right hemo-pneumothorax and pulmonary contusions. Rib fractures noted 3-10, with 5th rib fractured in two places.  Also small T7-T9 transverse process fractures.     Patient was admitted to medicine service with surgery consulted.   No chest tube has been required, and no indication for surgical intervention.  Her home eliquis was held to avoid bleeding from acute thoracic injury.    1/27: repeat CXR, stable R apical PTX of ~5%, increased right pleural fluid. 1/29: no residual PTX on CXR today.  Stable but pain remains uncontrolled.   1/30: increased O2 needs overnight.  Started antibiotic for possible secondary pneumonia with poor inspiration from rib fractures, given a dose IV Lasix.  1/31-2/1: remained on NRB and was transitioned to HFNC up to 40L  2/2-2/4: patient having very slow and gradual improvement in respiratory status. Weaning HFNC gradually.  2/5: Now transitioned from HFNC to regular [place] on 5L. 2/6: patient continues to have improvement in respiratory status. Now on 3L [place]. Adherent with frequent incentive spirometer use. Normal WOB. Repeat chest xray showed improvement from previous. Eliquis restarted today. Completed Abx.  2/7: oxygen weaning continues. Now on 2L [place]. She will likely be able to wean to room air soon. Her pain is well controlled from Rib fractures and she is having normal WOB. Hgb remains stable with restarting of eliquis.    Discharge Condition: Good, improved Recommended discharge diet: Regular healthy diet  Consultations: CVTS  Procedures/Studies: none  Allergies as of 08/26/2022   No Known Allergies      Medication List     STOP taking these medications    amLODipine 5 MG tablet Commonly known as: NORVASC   furosemide 20 MG tablet Commonly known as: LASIX    furosemide 40 MG tablet Commonly known as: LASIX       TAKE these medications    acetaminophen 325 MG tablet Commonly known as: TYLENOL Take 2 tablets (650 mg total) by mouth every 4 (four) hours as needed for headache or mild pain.   allopurinol 100 MG tablet Commonly known as: ZYLOPRIM TAKE 1.5 TABLETS (150 MG TOTAL) BY MOUTH DAILY   amiodarone 200 MG tablet Commonly known as: PACERONE Take 1 tablet (200 mg total) by mouth 2 (two) times daily.   cholecalciferol 25 MCG (1000 UNIT) tablet Commonly known as: VITAMIN D3 Take 2 tablets (2,000 Units total) by mouth daily.   Eliquis 2.5 MG Tabs tablet Generic drug: apixaban TAKE 1 TABLET BY MOUTH TWICE A DAY   fluticasone 50 MCG/ACT nasal spray Commonly known as: FLONASE Place 2 sprays into both nostrils 2 (two) times daily as needed for allergies or rhinitis.   gabapentin 100 MG capsule Commonly known as: NEURONTIN Take 1 capsule (100 mg total) by mouth 3 (three) times daily.   LORazepam 0.5 MG tablet Commonly known as: ATIVAN TAKE 1 TABLET BY MOUTH TWICE A DAY AS NEEDED FOR ANXIETY   methocarbamol 750 MG tablet Commonly known as: ROBAXIN Take 1 tablet (750 mg total) by mouth every 8 (eight) hours.   mirtazapine 30 MG tablet Commonly known as: REMERON Take 1 tablet (30 mg total) by mouth at bedtime.   multivitamin capsule Take 1 capsule by mouth daily.   PRESERVISION AREDS 2+MULTI VIT PO Take 1 capsule by mouth in the morning and at bedtime.   sodium chloride 0.65 % Soln nasal spray Commonly known as: OCEAN Place 1 spray into both nostrils as needed for congestion.   traZODone 50 MG tablet Commonly known as: DESYREL Take 0.5 tablets (25 mg total) by mouth at bedtime as needed for sleep.        Contact information for after-discharge care     [facility] .   Service: Skilled Nursing Contact information: [address] [place] [phone]                     Subjective   Pt reports feeling well today. She is tolerating a diet well. Endorses frequent use of her incentive spirometer and is doing well with it. Pain is well controlled. Denies respiratory distress. She is anxious to get to rehab with goal to get home as soon as possible so she can see her cat, Mila.   All questions and concerns were addressed at time of discharge.  Objective  Blood pressure 118/70, pulse (!) 57, temperature 98.2 F (36.8 C), resp. rate 16, weight 57.3 kg, SpO2 97 %.   General: Pt is alert, awake, not in acute distress Cardiovascular: RRR, S1/S2 +, no rubs, no gallops Respiratory: CTA bilaterally, no wheezing, no rhonchi Abdominal: Soft, NT, ND, bowel sounds + Extremities: no edema, no cyanosis  The results of significant diagnostics from this hospitalization (including imaging, microbiology, ancillary and laboratory) are listed below for reference.   Imaging studies: DG Chest Port 1 View  Result Date: 08/25/2022 CLINICAL DATA:  Shortness of breath. EXAM:  PORTABLE CHEST 1 VIEW COMPARISON:  Chest x-ray 08/21/2022, 08/14/2022, 05/08/2022. CT 08/14/2022. FINDINGS: Mediastinum and hilar structures normal. Cardiomegaly. No pulmonary venous congestion noted. Diffuse bilateral interstitial prominence and bilateral pleural effusions noted suggesting CHF. Pneumonitis cannot be excluded. A component of chronic underlying interstitial disease cannot be excluded. No pneumothorax noted. Multiple acute right rib fractures again noted. Multiple old left rib fractures are again noted. Degenerative changes scoliosis thoracic spine. IMPRESSION: 1. Cardiomegaly. Diffuse bilateral interstitial prominence and bilateral pleural effusions noted suggesting CHF. Pneumonitis cannot be excluded. A component of chronic underlying interstitial disease cannot be excluded. 2. Multiple acute right rib fractures again noted. Multiple old  left rib fractures again noted. No pneumothorax. Electronically Signed   By: Marcello Moores  Register M.D.   On: 08/25/2022 07:28   DG Chest Port 1 View  Result Date: 08/21/2022 CLINICAL DATA:  Hypoxia, recent right rib fractures EXAM: PORTABLE CHEST 1 VIEW COMPARISON:  08/19/2022 FINDINGS: Single frontal view of the chest demonstrates a stable cardiac silhouette. Diffuse interstitial prominence is again noted, with slight improvement in the bibasilar ground-glass airspace disease seen previously. Multiple right-sided rib fractures are again noted, with stable right pleural effusion and right basilar consolidation. Small left pleural effusion is unchanged. No pneumothorax. IMPRESSION: 1. Stable right-sided rib fractures and moderate right pleural effusion. No pneumothorax. 2. Persistent but improving interstitial and ground-glass opacities throughout the lungs, which may reflect improving volume status. 3. Small left pleural effusion. Electronically Signed   By: Randa Ngo M.D.   On: 08/21/2022 19:22   ECHOCARDIOGRAM COMPLETE  Result Date: 08/21/2022    ECHOCARDIOGRAM REPORT   Patient Name:   [name] Date of Exam: 08/21/2022 Medical Rec #:  [id number]          Height:       67.0 in Accession #:    [id number]         Weight:       129.6 lb Date of Birth:  08-13-1940          BSA:          1.682 m Patient Age:    48 years           BP:           132/79 mmHg Patient Gender: F                  HR:           75 bpm. Exam Location:  [facility] Procedure: 2D Echo, Cardiac Doppler and Color Doppler Indications:     CHF-acute diastolic E52.77  History:         Patient has prior history of Echocardiogram examinations, most                  recent 12/29/2021. CHF, Arrythmias:Atrial Fibrillation; Risk                  Factors:Hypertension.  Sonographer:     Sherrie Sport Referring Phys:  [id number] [place] Diagnosing Phys: Ida Rogue MD  Sonographer Comments: Suboptimal apical window. IMPRESSIONS  1. Left ventricular  ejection fraction, by estimation, is 55 to 60%. The left ventricle has normal function. The left ventricle has no regional wall motion abnormalities. There is mild left ventricular hypertrophy. Left ventricular diastolic parameters are indeterminate.  2. Right ventricular systolic function is normal. The right ventricular size is normal.  3. The mitral valve is normal in structure. Mild mitral valve regurgitation. No evidence of mitral  stenosis.  4. The aortic valve is normal in structure. Aortic valve regurgitation is not visualized. No aortic stenosis is present.  5. The inferior vena cava is normal in size with greater than 50% respiratory variability, suggesting right atrial pressure of 3 mmHg. FINDINGS  Left Ventricle: Left ventricular ejection fraction, by estimation, is 55 to 60%. The left ventricle has normal function. The left ventricle has no regional wall motion abnormalities. The left ventricular internal cavity size was normal in size. There is  mild left ventricular hypertrophy. Left ventricular diastolic parameters are indeterminate. Right Ventricle: The right ventricular size is normal. No increase in right ventricular wall thickness. Right ventricular systolic function is normal. Left Atrium: Left atrial size was normal in size. Right Atrium: Right atrial size was normal in size. Pericardium: There is no evidence of pericardial effusion. Mitral Valve: The mitral valve is normal in structure. There is mild thickening of the mitral valve leaflet(s). Mild mitral valve regurgitation. No evidence of mitral valve stenosis. Tricuspid Valve: The tricuspid valve is normal in structure. Tricuspid valve regurgitation is mild . No evidence of tricuspid stenosis. Aortic Valve: The aortic valve is normal in structure. Aortic valve regurgitation is not visualized. No aortic stenosis is present. Aortic valve mean gradient measures 3.0 mmHg. Aortic valve peak gradient measures 5.2 mmHg. Aortic valve area, by VTI  measures 2.34 cm. Pulmonic Valve: The pulmonic valve was normal in structure. Pulmonic valve regurgitation is not visualized. No evidence of pulmonic stenosis. Aorta: The aortic root is normal in size and structure. Venous: The inferior vena cava is normal in size with greater than 50% respiratory variability, suggesting right atrial pressure of 3 mmHg. IAS/Shunts: No atrial level shunt detected by color flow Doppler.  LEFT VENTRICLE PLAX 2D LVIDd:         3.65 cm   Diastology LVIDs:         2.70 cm   LV e' medial:    6.96 cm/s LV PW:         1.00 cm   LV E/e' medial:  14.8 LV IVS:        1.00 cm   LV e' lateral:   10.80 cm/s LVOT diam:     1.90 cm   LV E/e' lateral: 9.5 LV SV:         48 LV SV Index:   29 LVOT Area:     2.84 cm  RIGHT VENTRICLE RV Basal diam:  3.30 cm RV Mid diam:    2.90 cm RV S prime:     12.90 cm/s TAPSE (M-mode): 2.2 cm LEFT ATRIUM             Index        RIGHT ATRIUM           Index LA diam:        3.10 cm 1.84 cm/m   RA Area:     17.00 cm LA Vol (A2C):   29.9 ml 17.78 ml/m  RA Volume:   46.40 ml  27.59 ml/m LA Vol (A4C):   36.6 ml 21.76 ml/m LA Biplane Vol: 36.8 ml 21.88 ml/m  AORTIC VALVE AV Area (Vmax):    2.04 cm AV Area (Vmean):   2.02 cm AV Area (VTI):     2.34 cm AV Vmax:           114.00 cm/s AV Vmean:          79.700 cm/s AV VTI:  0.206 m AV Peak Grad:      5.2 mmHg AV Mean Grad:      3.0 mmHg LVOT Vmax:         82.20 cm/s LVOT Vmean:        56.700 cm/s LVOT VTI:          0.170 m LVOT/AV VTI ratio: 0.83  AORTA Ao Root diam: 3.10 cm MITRAL VALVE                TRICUSPID VALVE MV Area (PHT): 5.50 cm     TR Peak grad:   14.3 mmHg MV Decel Time: 138 msec     TR Vmax:        189.00 cm/s MV E velocity: 103.00 cm/s MV A velocity: 50.30 cm/s   SHUNTS MV E/A ratio:  2.05         Systemic VTI:  0.17 m                             Systemic Diam: 1.90 cm Ida Rogue MD Electronically signed by Ida Rogue MD Signature Date/Time: 08/21/2022/2:33:34 PM    Final    DG  Chest Port 1 View  Result Date: 08/19/2022 CLINICAL DATA:  Acute respiratory failure with hypoxia. EXAM: PORTABLE CHEST 1 VIEW COMPARISON:  August 17, 2022. FINDINGS: Stable cardiomegaly. Stable bilateral opacities are noted concerning for pneumonia or edema with associated pleural effusions. Stable right rib fractures are noted. IMPRESSION: Stable bilateral lung opacities and pleural effusions as noted above. Stable right rib fractures. Electronically Signed   By: Marijo Conception M.D.   On: 08/19/2022 09:03   DG Chest Port 1 View  Result Date: 08/18/2022 CLINICAL DATA:  Hypoxia EXAM: PORTABLE CHEST 1 VIEW COMPARISON:  08/17/2022 FINDINGS: Patchy lingular and left lower lobe opacity, mildly progressive, favoring atelectasis over pneumonia. Small left and moderate right pleural effusions. No pneumothorax is radiographically evident. Multiple right rib fractures. The heart is normal in size.  Thoracic aortic atherosclerosis. IMPRESSION: Patchy lingular and left lower lobe opacity, mildly progressive, favoring atelectasis over pneumonia. Small left and moderate right pleural effusions. No pneumothorax is radiographically evident. Multiple right rib fractures. Electronically Signed   By: Julian Hy M.D.   On: 08/18/2022 00:10   DG Chest 1 View  Result Date: 08/17/2022 CLINICAL DATA:  Follow-up pneumothorax EXAM: CHEST  1 VIEW COMPARISON:  08/16/2022 FINDINGS: I can no longer identify any pleural air in the right hemithorax. Multiple right rib fractures as seen previously. Persistent and possibly slightly worsened atelectasis the right lung base. Lesser atelectasis at the left lung base. IMPRESSION: No persistent visible pneumothorax on the right. Persistent and possibly slightly worsened atelectasis at the right lung base. Lesser atelectasis at the left lung base. Electronically Signed   By: Nelson Chimes M.D.   On: 08/17/2022 08:05   DG Chest Port 1 View  Result Date: 08/16/2022 CLINICAL DATA:   Follow-up right rib fractures and right pneumothorax. EXAM: PORTABLE CHEST 1 VIEW COMPARISON:  Chest radiographs dated 08/15/2022 and chest CT dated 08/14/2022. FINDINGS: Multiple displaced right rib fractures are again demonstrated. Less than 5% right apical pneumothorax with an interval decrease in size. Increased interval increased density in the right mid lung zone as well as increased ill-defined density in the right lower lung zone. Patchy density in the left mid lung zone and linear density in the left lower lobe is not changed significantly. No gross change in  right pleural fluid and decreased left pleural fluid. Stable enlarged cardiac silhouette and old, healed left rib fractures. IMPRESSION: 1. Less than 5% right apical pneumothorax with an interval decrease in size. 2. Increased right mid and lower lung zone atelectasis and possible pneumonia or pulmonary contusions. 3. Stable left mid lung zone and left lower lobe patchy atelectasis and possible pneumonia or pulmonary contusions. 4. Stable right pleural fluid and decreased left pleural fluid. Electronically Signed   By: Claudie Revering M.D.   On: 08/16/2022 12:02   DG Chest 2 View  Result Date: 08/15/2022 CLINICAL DATA:  Right pneumothorax with multilevel right ribcage fractures. [id number], [id number]. EXAM: CHEST - 2 VIEW COMPARISON:  Portable chest yesterday at 1:50 p.m., chest CT with contrast yesterday at 4:12 a.m. FINDINGS: Small right apical pneumothorax is not significantly changed, estimated volume 5%. There is increasing right pleural fluid today now covering the hemidiaphragm, small unchanged left pleural fluid. Patchy airspace disease in the bases appear similar. No new lung opacity is seen. There are multilevel recent right ribcage fractures most laterally. Osteopenia. IMPRESSION: 1. No significant change in small right apical pneumothorax, estimated volume 5%. 2. Increased right pleural fluid, now covers which the dome of the right diaphragm. 3.  No change in the small left pleural fluid and bibasilar opacities. 4. Multilevel recent right ribcage fractures. Electronically Signed   By: Telford Nab M.D.   On: 08/15/2022 07:30   DG Chest Port 1 View  Result Date: 08/14/2022 CLINICAL DATA:  Hemopneumothorax. EXAM: PORTABLE CHEST 1 VIEW COMPARISON:  Radiographs 08/14/2022 and 05/11/2022.  CT 08/14/2022. FINDINGS: Stable cardiomegaly and aortic atherosclerosis. Stable pleural effusions and bibasilar pulmonary opacities. Pleural line of known right apical pneumothorax is not well visualized, although this pneumothorax has not enlarged and is likely stable. Multiple acute right-sided rib fractures are noted. There are old left-sided rib fractures. No definite new findings are seen. Telemetry leads overlie the chest. IMPRESSION: No significant change in the appearance of the chest. Stable cardiomegaly, pleural effusions and bibasilar pulmonary opacities. Probable small residual right apical pneumothorax, unchanged. Multiple acute right-sided rib fractures. Electronically Signed   By: Richardean Sale M.D.   On: 08/14/2022 14:12   X-ray chest PA and lateral  Addendum Date: 08/14/2022   ADDENDUM REPORT: 08/14/2022 09:55 ADDENDUM: Findings were discussed with Dr. Blaine Hamper. Electronically Signed   By: Sammie Bench M.D.   On: 08/14/2022 09:55   Result Date: 08/14/2022 CLINICAL DATA:  [id number] Hemopneumothorax on right [id number] EXAM: CHEST - 2 VIEW COMPARISON:  08/14/2022, 2:55 a.m. FINDINGS: Cardiac silhouette is prominent. There is pulmonary interstitial prominence with vascular congestion. No focal consolidation. There is a moderate left-sided pleural effusion and tiny pleural effusion on right. Aorta is calcified. Acute right lateral fifth through eighth rib fractures. Chronic left-sided rib fractures identified as better demonstrated on CT. Tiny right apical pneumothorax, smaller than on the study from earlier. IMPRESSION: 1. Findings suggest CHF. 2. Numerous  acute right-sided rib fractures and chronic left-sided rib fractures. The possibility of non-accidental trauma must be considered. 3. Tiny right apical pneumothorax. 4. Moderate left-sided pleural effusion. Electronically Signed: By: Sammie Bench M.D. On: 08/14/2022 09:01   CT CHEST ABDOMEN PELVIS W CONTRAST  Result Date: 08/14/2022 CLINICAL DATA:  82 year old female status post fall. Right rib fractures and small pneumothorax on portable chest x-ray this morning. EXAM: CT CHEST, ABDOMEN, AND PELVIS WITH CONTRAST TECHNIQUE: Multidetector CT imaging of the chest, abdomen and pelvis was performed following the standard protocol during bolus  administration of intravenous contrast. RADIATION DOSE REDUCTION: This exam was performed according to the departmental dose-optimization program which includes automated exposure control, adjustment of the mA and/or kV according to patient size and/or use of iterative reconstruction technique. CONTRAST:  60m OMNIPAQUE IOHEXOL 300 MG/ML  SOLN COMPARISON:  Portable chest 0255 hours. Cardiac CT 02/26/2022. CT Abdomen and Pelvis 02/15/2012. High-resolution chest CT 01/22/2022. FINDINGS: CT CHEST FINDINGS Cardiovascular: Calcified aortic atherosclerosis. Tortuous thoracic aorta without dissection or discrete aneurysm. Cardiomegaly. No pericardial effusion. Central mediastinal vascular structures appear intact. Mediastinum/Nodes: No mediastinal hematoma or lymphadenopathy. Lungs/Pleura: Small volume layering complex fluid density right pleural effusion compatible with small hemothorax on series 2, image 34. Superimposed small right pneumothorax, most visible at the anterior costophrenic, cardiophrenic angle. Enhancing but confluent opacity throughout the posterior basal segment of the right lower lobe, with additional patchy pulmonary contusion in the lateral basal segment of that lobe on series 4, image 80. Major airways remain patent, lung volumes are low. Contralateral left  lower lobe and lingula atelectasis. No left-side pneumothorax or pleural effusion. Musculoskeletal: Numerous chronic left rib fractures were present on the chest CT last year. Acute fractures of the right lateral 3rd, 5th, 6th, 7th, 8th and 10th ribs. Most are mildly displaced. Superimposed posterior right 5th rib fracture is minimally displaced. Visible shoulder osseous structures and the sternum appear intact. There are small avulsion fractures in the right thoracic vertebrae transverse processes of T7 (series 4, image 61), T8, T9. But on sagittal images thoracic vertebral height and alignment is maintained and stable from last year CT ABDOMEN PELVIS FINDINGS Hepatobiliary: Several small and circumscribed low-density areas in both hepatic lobes do not appear to be traumatic (right lobe series 2, image 54) and some were visible in 2013. These are largely unchanged on the delayed images and most likely benign hepatic cysts (no follow-up imaging recommended). Layering sludge in the gallbladder. No pericholecystic inflammation. No perihepatic fluid. Pancreas: Partially atrophy. Spleen:  Intact. No perisplenic fluid. Adrenals/Urinary Tract: Adrenal glands appears stable and intact. Kidneys appears stable and intact with some renal parenchymal volume loss. Symmetric renal contrast excretion on the delayed images. Distended but otherwise unremarkable bladder. Occasional pelvic phleboliths. Stomach/Bowel: No dilated large or small bowel. Redundant large bowel. Sigmoid diverticulosis in the pelvis. Normal appendix in the right lower quadrant coronal image 39. Flocculated material in terminal ileum and other pelvic small bowel, but no upstream dilated loops. Decompressed stomach and duodenum. No free air or free fluid. Vascular/Lymphatic: Extensive Aortoiliac calcified atherosclerosis. Negative for abdominal aortic aneurysm. Major arterial structures remain patent. Portal venous system appears to be patent. No  lymphadenopathy identified. Reproductive: Chronically absent uterus, diminutive or absent ovaries. Other: No pelvis free fluid. Musculoskeletal: Chronic L1 superior endplate compression fracture is stable from last year. Chronic severe lumbar disc and endplate degeneration superimposed. Chronic right hip arthroplasty. Lumbar spine, sacrum, SI joints, pelvis, and proximal femurs appear intact. IMPRESSION: 1. Small volume Right Hemo-pneumothorax. Suspect confluent right lower lobe pulmonary contusion in the posterior basal segment. Smaller contusion in the lateral basal segment. 2. Associated acute fractures of the right ribs 3 through 10. The 5th rib is fractured in two places. And there are associated small thoracic vertebral transverse process tip fractures at T7 through T9. 3. No other acute vertebral fracture identified. Chronic L1 compression fracture. 4. No other acute traumatic injury identified in the chest, abdomen, or pelvis. 5.  Aortic Atherosclerosis (ICD10-I70.0).  Cardiomegaly. Electronically Signed   By: HGenevie AnnM.D.   On:  08/14/2022 05:03   DG Chest Port 1 View  Result Date: 08/14/2022 CLINICAL DATA:  Fall with right ribcage pain. EXAM: PORTABLE CHEST 1 VIEW COMPARISON:  PA and lateral chest 05/08/2022. FINDINGS: There are slightly displaced acute fractures of the lateral right fifth through eighth ribs and mild underlying pleural reaction. No other displaced rib fracture is seen. There are healed fracture deformities of the posterolateral left fourth through 7th ribs, noted previously. There is generalized osteopenia with no other acute skeletal findings. Also noted is an acute small right apical pneumothorax, estimated volume 5%, without mediastinal shift. There is increased opacity in right base which could be atelectasis, pneumonia or posttraumatic contusions. Remaining lungs are clear with COPD change and decreased pulmonary inspiration from the previous exam. There is mild aortic tortuosity  and patchy calcific plaques with stable mediastinum. No vascular congestion is seen.  Mild cardiomegaly. IMPRESSION: 1. Mildly displaced acute fractures of the lateral right fifth through eighth ribs. 2. Small right apical pneumothorax estimated volume 5%. 3. Increased opacity in the right base which could be atelectasis, pneumonia or posttraumatic contusions. 4. COPD. 5. Aortic atherosclerosis.  Mild cardiomegaly. 6. These results will be called to the ordering clinician or representative by the Radiologist Assistant, and communication documented in the PACS or [employer]. Electronically Signed   By: Telford Nab M.D.   On: 08/14/2022 03:37    Labs: Basic Metabolic Panel: Recent Labs  Lab 08/21/22 0404 08/22/22 0458 08/23/22 0528 08/24/22 0449 08/25/22 0619 08/26/22 0552  NA 138 136 137 137 137 136  K 3.3* 4.1 4.7 4.0 4.0 4.3  CL 96* 94* 95* 93* 95* 97*  CO2 31 32 34* 32 34* 31  GLUCOSE 89 99 95 94 98 107*  BUN 21 23 30* 25* 24* 27*  CREATININE 0.80 0.88 1.07* 0.96 1.12* 1.16*  CALCIUM 9.0 8.8* 8.7* 8.8* 9.0 8.8*  MG 1.5* 1.8 2.1  --   --   --   PHOS  --  2.5  --   --   --   --    CBC: Recent Labs  Lab 08/21/22 0404 08/23/22 0528 08/24/22 0449 08/25/22 0619 08/26/22 0552  WBC 8.7 7.0 7.1 7.4 7.8  HGB 10.9* 9.4* 9.9* 9.7* 9.2*  HCT 33.1* 28.3* 29.5* 30.4* 28.4*  MCV 104.4* 104.0* 104.2* 104.8* 104.4*  PLT 327 381 412* 458* 466*   Microbiology: Results for orders placed or performed during the hospital encounter of 11/15/21  Resp Panel by RT-PCR (Flu A&B, Covid) Nasopharyngeal Swab     Status: None   Collection Time: 11/15/21 12:34 PM   Specimen: Nasopharyngeal Swab; Nasopharyngeal(NP) swabs in vial transport medium  Result Value Ref Range Status   SARS Coronavirus 2 by RT PCR NEGATIVE NEGATIVE Final    Comment: (NOTE) SARS-CoV-2 target nucleic acids are NOT DETECTED.  The SARS-CoV-2 RNA is generally detectable in upper respiratory specimens during the acute  phase of infection. The lowest concentration of SARS-CoV-2 viral copies this assay can detect is 138 copies/mL. A negative result does not preclude SARS-Cov-2 infection and should not be used as the sole basis for treatment or other patient management decisions. A negative result may occur with  improper specimen collection/handling, submission of specimen other than nasopharyngeal swab, presence of viral mutation(s) within the areas targeted by this assay, and inadequate number of viral copies(<138 copies/mL). A negative result must be combined with clinical observations, patient history, and epidemiological information. The expected result is Negative.  Fact Sheet for Patients:  EntrepreneurPulse.com.au  Fact Sheet for Healthcare Providers:  IncredibleEmployment.be  This test is no t yet approved or cleared by the [place] FDA and  has been authorized for detection and/or diagnosis of SARS-CoV-2 by FDA under an Emergency Use Authorization (EUA). This EUA will remain  in effect (meaning this test can be used) for the duration of the COVID-19 declaration under Section 564(b)(1) of the Act, 21 U.S.C.section 360bbb-3(b)(1), unless the authorization is terminated  or revoked sooner.       Influenza A by PCR NEGATIVE NEGATIVE Final   Influenza B by PCR NEGATIVE NEGATIVE Final    Comment: (NOTE) The Xpert Xpress SARS-CoV-2/FLU/RSV plus assay is intended as an aid in the diagnosis of influenza from Nasopharyngeal swab specimens and should not be used as a sole basis for treatment. Nasal washings and aspirates are unacceptable for Xpert Xpress SARS-CoV-2/FLU/RSV testing.  Fact Sheet for Patients: EntrepreneurPulse.com.au  Fact Sheet for Healthcare Providers: IncredibleEmployment.be  This test is not yet approved or cleared by the [place] FDA and has been authorized for detection and/or diagnosis of  SARS-CoV-2 by FDA under an Emergency Use Authorization (EUA). This EUA will remain in effect (meaning this test can be used) for the duration of the COVID-19 declaration under Section 564(b)(1) of the Act, 21 U.S.C. section 360bbb-3(b)(1), unless the authorization is terminated or revoked.  Performed at [facility], [address] [place], [place] [address]   Aerobic/Anaerobic Culture w Gram Stain (surgical/deep wound)     Status: None   Collection Time: 11/15/21  6:10 PM   Specimen: Pleural Fluid  Result Value Ref Range Status   Specimen Description   Final    PLEURAL Performed at [facility], [address] [place], [place] [address]    Special Requests   Final    PLEURAL Performed at [facility], [address] [place], [place] [address]    Gram Stain   Final    ABUNDANT WBC PRESENT, PREDOMINANTLY MONONUCLEAR NO ORGANISMS SEEN    Culture   Final    No growth aerobically or anaerobically. Performed at [facility], [place] [address] [place], [place] [address]    Report Status 11/21/2021 FINAL  Final   *Note: Due to a large number of results and/or encounters for the requested time period, some results have not been displayed. A complete set of results can be found in Results Review.   Time coordinating discharge: Over 30 minutes  Richarda Osmond, MD  [facility] 08/26/2022, 10:10 AM

## 2022-08-26 NOTE — Care Management Important Message (Signed)
Important Message  Patient Details  Name: [name] MRN: [id number] Date of Birth: 08-10-1940   Medicare Important Message Given:  Yes     [name] 08/26/2022, 10:22 AM

## 2022-08-26 NOTE — TOC Transition Note (Addendum)
Transition of Care [facility]) - CM/SW Discharge Note   Patient Details  Name: PARUL PORCELLI MRN: [id number] Date of Birth: August 18, 1940  Transition [facility]) CM/SW Contact:  Laurena Slimmer, RN Phone Number: 08/26/2022, 9:41 AM   Clinical Narrative:    Damaris Schooner with Sharyn Lull from [place] to see if patient can be accepted today. Patient assigned room # 908. Nurse will call report to [phone].  MD notified.  Face sheet and medical necessity printed to the floor DNR previously signed by MD EMS arranged for 2pm\ Family notified.  Discharge summary and SNF transfer report sent  TOC signing off.           Patient Goals and CMS Choice      Discharge Placement                         Discharge Plan and Services Additional resources added to the After Visit Summary for                                       Social Determinants of Health (SDOH) Interventions SDOH Screenings   Food Insecurity: No Food Insecurity (02/13/2022)  Housing: Low Risk  (02/13/2022)  Transportation Needs: No Transportation Needs (02/13/2022)  Depression (PHQ2-9): Low Risk  (08/04/2022)  Financial Resource Strain: Low Risk  (02/13/2022)  Physical Activity: Unknown (02/10/2021)  Social Connections: Unknown (02/13/2022)  Stress: No Stress Concern Present (02/13/2022)  Tobacco Use: Medium Risk (08/17/2022)     Readmission Risk Interventions     No data to display

## 2022-08-27 ENCOUNTER — Telehealth: Payer: Self-pay | Admitting: Family Medicine

## 2022-08-27 DIAGNOSIS — I10 Essential (primary) hypertension: Secondary | ICD-10-CM | POA: Diagnosis not present

## 2022-08-27 DIAGNOSIS — S2241XA Multiple fractures of ribs, right side, initial encounter for closed fracture: Secondary | ICD-10-CM | POA: Diagnosis not present

## 2022-08-27 DIAGNOSIS — I5032 Chronic diastolic (congestive) heart failure: Secondary | ICD-10-CM | POA: Diagnosis not present

## 2022-08-27 DIAGNOSIS — J181 Lobar pneumonia, unspecified organism: Secondary | ICD-10-CM | POA: Diagnosis not present

## 2022-08-27 DIAGNOSIS — J9601 Acute respiratory failure with hypoxia: Secondary | ICD-10-CM | POA: Diagnosis not present

## 2022-08-27 DIAGNOSIS — J90 Pleural effusion, not elsewhere classified: Secondary | ICD-10-CM | POA: Diagnosis not present

## 2022-08-27 DIAGNOSIS — I4811 Longstanding persistent atrial fibrillation: Secondary | ICD-10-CM | POA: Diagnosis not present

## 2022-08-27 DIAGNOSIS — N1831 Chronic kidney disease, stage 3a: Secondary | ICD-10-CM | POA: Diagnosis not present

## 2022-08-27 DIAGNOSIS — S272XXA Traumatic hemopneumothorax, initial encounter: Secondary | ICD-10-CM | POA: Diagnosis not present

## 2022-08-27 DIAGNOSIS — R296 Repeated falls: Secondary | ICD-10-CM | POA: Diagnosis not present

## 2022-08-27 DIAGNOSIS — M6281 Muscle weakness (generalized): Secondary | ICD-10-CM | POA: Diagnosis not present

## 2022-08-27 NOTE — Telephone Encounter (Signed)
Noted.  We can follow-up with her when she is out of rehab.

## 2022-08-27 NOTE — Telephone Encounter (Signed)
Patient's daughter [name] called to let Dr Caryl Bis know that her mother is out of the hospital and in rehab. Not sure for how long. [facility] and [place], [place].

## 2022-08-28 DIAGNOSIS — J181 Lobar pneumonia, unspecified organism: Secondary | ICD-10-CM | POA: Diagnosis not present

## 2022-08-28 DIAGNOSIS — R0602 Shortness of breath: Secondary | ICD-10-CM | POA: Diagnosis not present

## 2022-08-28 DIAGNOSIS — R2689 Other abnormalities of gait and mobility: Secondary | ICD-10-CM | POA: Diagnosis not present

## 2022-08-28 DIAGNOSIS — Z9181 History of falling: Secondary | ICD-10-CM | POA: Diagnosis not present

## 2022-08-28 DIAGNOSIS — I482 Chronic atrial fibrillation, unspecified: Secondary | ICD-10-CM | POA: Diagnosis not present

## 2022-08-28 DIAGNOSIS — S2249XD Multiple fractures of ribs, unspecified side, subsequent encounter for fracture with routine healing: Secondary | ICD-10-CM | POA: Diagnosis not present

## 2022-08-28 DIAGNOSIS — M6281 Muscle weakness (generalized): Secondary | ICD-10-CM | POA: Diagnosis not present

## 2022-08-28 DIAGNOSIS — R296 Repeated falls: Secondary | ICD-10-CM | POA: Diagnosis not present

## 2022-08-28 DIAGNOSIS — S2241XA Multiple fractures of ribs, right side, initial encounter for closed fracture: Secondary | ICD-10-CM | POA: Diagnosis not present

## 2022-08-28 DIAGNOSIS — I5032 Chronic diastolic (congestive) heart failure: Secondary | ICD-10-CM | POA: Diagnosis not present

## 2022-08-31 DIAGNOSIS — I4811 Longstanding persistent atrial fibrillation: Secondary | ICD-10-CM | POA: Diagnosis not present

## 2022-08-31 DIAGNOSIS — J90 Pleural effusion, not elsewhere classified: Secondary | ICD-10-CM | POA: Diagnosis not present

## 2022-08-31 DIAGNOSIS — S272XXA Traumatic hemopneumothorax, initial encounter: Secondary | ICD-10-CM | POA: Diagnosis not present

## 2022-08-31 DIAGNOSIS — R296 Repeated falls: Secondary | ICD-10-CM | POA: Diagnosis not present

## 2022-08-31 DIAGNOSIS — N1831 Chronic kidney disease, stage 3a: Secondary | ICD-10-CM | POA: Diagnosis not present

## 2022-08-31 DIAGNOSIS — I10 Essential (primary) hypertension: Secondary | ICD-10-CM | POA: Diagnosis not present

## 2022-08-31 DIAGNOSIS — M6281 Muscle weakness (generalized): Secondary | ICD-10-CM | POA: Diagnosis not present

## 2022-08-31 DIAGNOSIS — S2241XA Multiple fractures of ribs, right side, initial encounter for closed fracture: Secondary | ICD-10-CM | POA: Diagnosis not present

## 2022-08-31 DIAGNOSIS — I5032 Chronic diastolic (congestive) heart failure: Secondary | ICD-10-CM | POA: Diagnosis not present

## 2022-08-31 DIAGNOSIS — D72829 Elevated white blood cell count, unspecified: Secondary | ICD-10-CM | POA: Diagnosis not present

## 2022-08-31 DIAGNOSIS — J9601 Acute respiratory failure with hypoxia: Secondary | ICD-10-CM | POA: Diagnosis not present

## 2022-08-31 DIAGNOSIS — J181 Lobar pneumonia, unspecified organism: Secondary | ICD-10-CM | POA: Diagnosis not present

## 2022-09-01 DIAGNOSIS — S272XXA Traumatic hemopneumothorax, initial encounter: Secondary | ICD-10-CM | POA: Diagnosis not present

## 2022-09-01 DIAGNOSIS — I5032 Chronic diastolic (congestive) heart failure: Secondary | ICD-10-CM | POA: Diagnosis not present

## 2022-09-01 DIAGNOSIS — I83023 Varicose veins of left lower extremity with ulcer of ankle: Secondary | ICD-10-CM | POA: Diagnosis not present

## 2022-09-01 DIAGNOSIS — I482 Chronic atrial fibrillation, unspecified: Secondary | ICD-10-CM | POA: Diagnosis not present

## 2022-09-01 DIAGNOSIS — S2241XA Multiple fractures of ribs, right side, initial encounter for closed fracture: Secondary | ICD-10-CM | POA: Diagnosis not present

## 2022-09-01 DIAGNOSIS — J9601 Acute respiratory failure with hypoxia: Secondary | ICD-10-CM | POA: Diagnosis not present

## 2022-09-01 DIAGNOSIS — S2249XD Multiple fractures of ribs, unspecified side, subsequent encounter for fracture with routine healing: Secondary | ICD-10-CM | POA: Diagnosis not present

## 2022-09-01 DIAGNOSIS — R2689 Other abnormalities of gait and mobility: Secondary | ICD-10-CM | POA: Diagnosis not present

## 2022-09-01 DIAGNOSIS — I4811 Longstanding persistent atrial fibrillation: Secondary | ICD-10-CM | POA: Diagnosis not present

## 2022-09-01 DIAGNOSIS — I10 Essential (primary) hypertension: Secondary | ICD-10-CM | POA: Diagnosis not present

## 2022-09-01 DIAGNOSIS — N1831 Chronic kidney disease, stage 3a: Secondary | ICD-10-CM | POA: Diagnosis not present

## 2022-09-01 DIAGNOSIS — M6281 Muscle weakness (generalized): Secondary | ICD-10-CM | POA: Diagnosis not present

## 2022-09-01 DIAGNOSIS — R296 Repeated falls: Secondary | ICD-10-CM | POA: Diagnosis not present

## 2022-09-01 DIAGNOSIS — Z9181 History of falling: Secondary | ICD-10-CM | POA: Diagnosis not present

## 2022-09-01 DIAGNOSIS — J90 Pleural effusion, not elsewhere classified: Secondary | ICD-10-CM | POA: Diagnosis not present

## 2022-09-01 DIAGNOSIS — J181 Lobar pneumonia, unspecified organism: Secondary | ICD-10-CM | POA: Diagnosis not present

## 2022-09-02 DIAGNOSIS — I4811 Longstanding persistent atrial fibrillation: Secondary | ICD-10-CM | POA: Diagnosis not present

## 2022-09-02 DIAGNOSIS — M6281 Muscle weakness (generalized): Secondary | ICD-10-CM | POA: Diagnosis not present

## 2022-09-02 DIAGNOSIS — R296 Repeated falls: Secondary | ICD-10-CM | POA: Diagnosis not present

## 2022-09-02 DIAGNOSIS — J181 Lobar pneumonia, unspecified organism: Secondary | ICD-10-CM | POA: Diagnosis not present

## 2022-09-02 DIAGNOSIS — S2241XA Multiple fractures of ribs, right side, initial encounter for closed fracture: Secondary | ICD-10-CM | POA: Diagnosis not present

## 2022-09-02 DIAGNOSIS — J90 Pleural effusion, not elsewhere classified: Secondary | ICD-10-CM | POA: Diagnosis not present

## 2022-09-02 DIAGNOSIS — N1831 Chronic kidney disease, stage 3a: Secondary | ICD-10-CM | POA: Diagnosis not present

## 2022-09-02 DIAGNOSIS — R0602 Shortness of breath: Secondary | ICD-10-CM | POA: Diagnosis not present

## 2022-09-02 DIAGNOSIS — J9601 Acute respiratory failure with hypoxia: Secondary | ICD-10-CM | POA: Diagnosis not present

## 2022-09-02 DIAGNOSIS — S272XXA Traumatic hemopneumothorax, initial encounter: Secondary | ICD-10-CM | POA: Diagnosis not present

## 2022-09-02 DIAGNOSIS — I10 Essential (primary) hypertension: Secondary | ICD-10-CM | POA: Diagnosis not present

## 2022-09-02 DIAGNOSIS — I5032 Chronic diastolic (congestive) heart failure: Secondary | ICD-10-CM | POA: Diagnosis not present

## 2022-09-04 DIAGNOSIS — I482 Chronic atrial fibrillation, unspecified: Secondary | ICD-10-CM | POA: Diagnosis not present

## 2022-09-04 DIAGNOSIS — S272XXA Traumatic hemopneumothorax, initial encounter: Secondary | ICD-10-CM | POA: Diagnosis not present

## 2022-09-04 DIAGNOSIS — S2249XD Multiple fractures of ribs, unspecified side, subsequent encounter for fracture with routine healing: Secondary | ICD-10-CM | POA: Diagnosis not present

## 2022-09-04 DIAGNOSIS — R296 Repeated falls: Secondary | ICD-10-CM | POA: Diagnosis not present

## 2022-09-04 DIAGNOSIS — S2241XA Multiple fractures of ribs, right side, initial encounter for closed fracture: Secondary | ICD-10-CM | POA: Diagnosis not present

## 2022-09-04 DIAGNOSIS — J181 Lobar pneumonia, unspecified organism: Secondary | ICD-10-CM | POA: Diagnosis not present

## 2022-09-04 DIAGNOSIS — J90 Pleural effusion, not elsewhere classified: Secondary | ICD-10-CM | POA: Diagnosis not present

## 2022-09-04 DIAGNOSIS — Z9181 History of falling: Secondary | ICD-10-CM | POA: Diagnosis not present

## 2022-09-04 DIAGNOSIS — I10 Essential (primary) hypertension: Secondary | ICD-10-CM | POA: Diagnosis not present

## 2022-09-04 DIAGNOSIS — N1831 Chronic kidney disease, stage 3a: Secondary | ICD-10-CM | POA: Diagnosis not present

## 2022-09-04 DIAGNOSIS — R2689 Other abnormalities of gait and mobility: Secondary | ICD-10-CM | POA: Diagnosis not present

## 2022-09-04 DIAGNOSIS — M6281 Muscle weakness (generalized): Secondary | ICD-10-CM | POA: Diagnosis not present

## 2022-09-04 DIAGNOSIS — J9601 Acute respiratory failure with hypoxia: Secondary | ICD-10-CM | POA: Diagnosis not present

## 2022-09-04 DIAGNOSIS — I4811 Longstanding persistent atrial fibrillation: Secondary | ICD-10-CM | POA: Diagnosis not present

## 2022-09-04 DIAGNOSIS — D72829 Elevated white blood cell count, unspecified: Secondary | ICD-10-CM | POA: Diagnosis not present

## 2022-09-04 DIAGNOSIS — I5032 Chronic diastolic (congestive) heart failure: Secondary | ICD-10-CM | POA: Diagnosis not present

## 2022-09-08 DIAGNOSIS — S2249XD Multiple fractures of ribs, unspecified side, subsequent encounter for fracture with routine healing: Secondary | ICD-10-CM | POA: Diagnosis not present

## 2022-09-08 DIAGNOSIS — I5032 Chronic diastolic (congestive) heart failure: Secondary | ICD-10-CM | POA: Diagnosis not present

## 2022-09-08 DIAGNOSIS — L89616 Pressure-induced deep tissue damage of right heel: Secondary | ICD-10-CM | POA: Diagnosis not present

## 2022-09-08 DIAGNOSIS — J181 Lobar pneumonia, unspecified organism: Secondary | ICD-10-CM | POA: Diagnosis not present

## 2022-09-08 DIAGNOSIS — M6281 Muscle weakness (generalized): Secondary | ICD-10-CM | POA: Diagnosis not present

## 2022-09-08 DIAGNOSIS — I83023 Varicose veins of left lower extremity with ulcer of ankle: Secondary | ICD-10-CM | POA: Diagnosis not present

## 2022-09-08 DIAGNOSIS — I482 Chronic atrial fibrillation, unspecified: Secondary | ICD-10-CM | POA: Diagnosis not present

## 2022-09-08 DIAGNOSIS — R2689 Other abnormalities of gait and mobility: Secondary | ICD-10-CM | POA: Diagnosis not present

## 2022-09-08 DIAGNOSIS — Z9181 History of falling: Secondary | ICD-10-CM | POA: Diagnosis not present

## 2022-09-09 ENCOUNTER — Ambulatory Visit: Payer: Medicare Other | Admitting: Cardiology

## 2022-09-09 DIAGNOSIS — N1831 Chronic kidney disease, stage 3a: Secondary | ICD-10-CM | POA: Diagnosis not present

## 2022-09-09 DIAGNOSIS — I10 Essential (primary) hypertension: Secondary | ICD-10-CM | POA: Diagnosis not present

## 2022-09-09 DIAGNOSIS — R296 Repeated falls: Secondary | ICD-10-CM | POA: Diagnosis not present

## 2022-09-09 DIAGNOSIS — I5032 Chronic diastolic (congestive) heart failure: Secondary | ICD-10-CM | POA: Diagnosis not present

## 2022-09-09 DIAGNOSIS — S272XXA Traumatic hemopneumothorax, initial encounter: Secondary | ICD-10-CM | POA: Diagnosis not present

## 2022-09-09 DIAGNOSIS — J9601 Acute respiratory failure with hypoxia: Secondary | ICD-10-CM | POA: Diagnosis not present

## 2022-09-09 DIAGNOSIS — J181 Lobar pneumonia, unspecified organism: Secondary | ICD-10-CM | POA: Diagnosis not present

## 2022-09-09 DIAGNOSIS — J90 Pleural effusion, not elsewhere classified: Secondary | ICD-10-CM | POA: Diagnosis not present

## 2022-09-09 DIAGNOSIS — D72829 Elevated white blood cell count, unspecified: Secondary | ICD-10-CM | POA: Diagnosis not present

## 2022-09-09 DIAGNOSIS — S2241XA Multiple fractures of ribs, right side, initial encounter for closed fracture: Secondary | ICD-10-CM | POA: Diagnosis not present

## 2022-09-09 DIAGNOSIS — I4811 Longstanding persistent atrial fibrillation: Secondary | ICD-10-CM | POA: Diagnosis not present

## 2022-09-09 DIAGNOSIS — M6281 Muscle weakness (generalized): Secondary | ICD-10-CM | POA: Diagnosis not present

## 2022-09-11 DIAGNOSIS — D72829 Elevated white blood cell count, unspecified: Secondary | ICD-10-CM | POA: Diagnosis not present

## 2022-09-11 DIAGNOSIS — J9601 Acute respiratory failure with hypoxia: Secondary | ICD-10-CM | POA: Diagnosis not present

## 2022-09-11 DIAGNOSIS — I482 Chronic atrial fibrillation, unspecified: Secondary | ICD-10-CM | POA: Diagnosis not present

## 2022-09-11 DIAGNOSIS — N1831 Chronic kidney disease, stage 3a: Secondary | ICD-10-CM | POA: Diagnosis not present

## 2022-09-11 DIAGNOSIS — S2241XA Multiple fractures of ribs, right side, initial encounter for closed fracture: Secondary | ICD-10-CM | POA: Diagnosis not present

## 2022-09-11 DIAGNOSIS — S2249XD Multiple fractures of ribs, unspecified side, subsequent encounter for fracture with routine healing: Secondary | ICD-10-CM | POA: Diagnosis not present

## 2022-09-11 DIAGNOSIS — S272XXA Traumatic hemopneumothorax, initial encounter: Secondary | ICD-10-CM | POA: Diagnosis not present

## 2022-09-11 DIAGNOSIS — Z9181 History of falling: Secondary | ICD-10-CM | POA: Diagnosis not present

## 2022-09-11 DIAGNOSIS — J181 Lobar pneumonia, unspecified organism: Secondary | ICD-10-CM | POA: Diagnosis not present

## 2022-09-11 DIAGNOSIS — I4811 Longstanding persistent atrial fibrillation: Secondary | ICD-10-CM | POA: Diagnosis not present

## 2022-09-11 DIAGNOSIS — M6281 Muscle weakness (generalized): Secondary | ICD-10-CM | POA: Diagnosis not present

## 2022-09-11 DIAGNOSIS — I10 Essential (primary) hypertension: Secondary | ICD-10-CM | POA: Diagnosis not present

## 2022-09-11 DIAGNOSIS — J90 Pleural effusion, not elsewhere classified: Secondary | ICD-10-CM | POA: Diagnosis not present

## 2022-09-11 DIAGNOSIS — R296 Repeated falls: Secondary | ICD-10-CM | POA: Diagnosis not present

## 2022-09-11 DIAGNOSIS — I5032 Chronic diastolic (congestive) heart failure: Secondary | ICD-10-CM | POA: Diagnosis not present

## 2022-09-11 DIAGNOSIS — R2689 Other abnormalities of gait and mobility: Secondary | ICD-10-CM | POA: Diagnosis not present

## 2022-09-15 DIAGNOSIS — I482 Chronic atrial fibrillation, unspecified: Secondary | ICD-10-CM | POA: Diagnosis not present

## 2022-09-15 DIAGNOSIS — M1990 Unspecified osteoarthritis, unspecified site: Secondary | ICD-10-CM | POA: Diagnosis not present

## 2022-09-15 DIAGNOSIS — M6281 Muscle weakness (generalized): Secondary | ICD-10-CM | POA: Diagnosis not present

## 2022-09-15 DIAGNOSIS — Z9181 History of falling: Secondary | ICD-10-CM | POA: Diagnosis not present

## 2022-09-15 DIAGNOSIS — J181 Lobar pneumonia, unspecified organism: Secondary | ICD-10-CM | POA: Diagnosis not present

## 2022-09-15 DIAGNOSIS — R2689 Other abnormalities of gait and mobility: Secondary | ICD-10-CM | POA: Diagnosis not present

## 2022-09-15 DIAGNOSIS — L89616 Pressure-induced deep tissue damage of right heel: Secondary | ICD-10-CM | POA: Diagnosis not present

## 2022-09-15 DIAGNOSIS — E785 Hyperlipidemia, unspecified: Secondary | ICD-10-CM | POA: Diagnosis not present

## 2022-09-15 DIAGNOSIS — I5032 Chronic diastolic (congestive) heart failure: Secondary | ICD-10-CM | POA: Diagnosis not present

## 2022-09-15 DIAGNOSIS — S2249XD Multiple fractures of ribs, unspecified side, subsequent encounter for fracture with routine healing: Secondary | ICD-10-CM | POA: Diagnosis not present

## 2022-09-17 ENCOUNTER — Telehealth: Payer: Self-pay | Admitting: Family Medicine

## 2022-09-17 DIAGNOSIS — J9601 Acute respiratory failure with hypoxia: Secondary | ICD-10-CM | POA: Diagnosis not present

## 2022-09-17 DIAGNOSIS — S2241XA Multiple fractures of ribs, right side, initial encounter for closed fracture: Secondary | ICD-10-CM | POA: Diagnosis not present

## 2022-09-17 DIAGNOSIS — I5032 Chronic diastolic (congestive) heart failure: Secondary | ICD-10-CM | POA: Diagnosis not present

## 2022-09-17 DIAGNOSIS — M6281 Muscle weakness (generalized): Secondary | ICD-10-CM | POA: Diagnosis not present

## 2022-09-17 DIAGNOSIS — S272XXA Traumatic hemopneumothorax, initial encounter: Secondary | ICD-10-CM | POA: Diagnosis not present

## 2022-09-17 DIAGNOSIS — D72829 Elevated white blood cell count, unspecified: Secondary | ICD-10-CM | POA: Diagnosis not present

## 2022-09-17 DIAGNOSIS — J90 Pleural effusion, not elsewhere classified: Secondary | ICD-10-CM | POA: Diagnosis not present

## 2022-09-17 DIAGNOSIS — I10 Essential (primary) hypertension: Secondary | ICD-10-CM | POA: Diagnosis not present

## 2022-09-17 DIAGNOSIS — R296 Repeated falls: Secondary | ICD-10-CM | POA: Diagnosis not present

## 2022-09-17 DIAGNOSIS — N1831 Chronic kidney disease, stage 3a: Secondary | ICD-10-CM | POA: Diagnosis not present

## 2022-09-17 DIAGNOSIS — J181 Lobar pneumonia, unspecified organism: Secondary | ICD-10-CM | POA: Diagnosis not present

## 2022-09-17 DIAGNOSIS — I4811 Longstanding persistent atrial fibrillation: Secondary | ICD-10-CM | POA: Diagnosis not present

## 2022-09-17 NOTE — Telephone Encounter (Signed)
[name] (daughter n law) called wanting to leave information regarding the pt care at the rehab center and a release date

## 2022-09-18 NOTE — Telephone Encounter (Signed)
[name] called back in regarding previous message. She would like to F/u with provider concerning pt, give an update. She said she can speak to his assistant as well. She's available [phone].

## 2022-09-22 DIAGNOSIS — M6281 Muscle weakness (generalized): Secondary | ICD-10-CM | POA: Diagnosis not present

## 2022-09-22 DIAGNOSIS — I482 Chronic atrial fibrillation, unspecified: Secondary | ICD-10-CM | POA: Diagnosis not present

## 2022-09-22 DIAGNOSIS — J181 Lobar pneumonia, unspecified organism: Secondary | ICD-10-CM | POA: Diagnosis not present

## 2022-09-22 DIAGNOSIS — Z9181 History of falling: Secondary | ICD-10-CM | POA: Diagnosis not present

## 2022-09-22 DIAGNOSIS — L89616 Pressure-induced deep tissue damage of right heel: Secondary | ICD-10-CM | POA: Diagnosis not present

## 2022-09-22 DIAGNOSIS — S2249XD Multiple fractures of ribs, unspecified side, subsequent encounter for fracture with routine healing: Secondary | ICD-10-CM | POA: Diagnosis not present

## 2022-09-22 DIAGNOSIS — I5032 Chronic diastolic (congestive) heart failure: Secondary | ICD-10-CM | POA: Diagnosis not present

## 2022-09-22 DIAGNOSIS — R2689 Other abnormalities of gait and mobility: Secondary | ICD-10-CM | POA: Diagnosis not present

## 2022-09-23 ENCOUNTER — Other Ambulatory Visit: Payer: Self-pay | Admitting: Internal Medicine

## 2022-09-23 NOTE — Telephone Encounter (Signed)
The 3/18 visit should be fine unless the provider at [facility] place feels she needs to be seen sooner after discharge.

## 2022-09-23 NOTE — Telephone Encounter (Signed)
I called and spoke with the patient and her daughter and the patient fell and broke 8 ribs, punctured her lungs and had pneumonia and RSV, she has been in rehab at [facility] rehab and she thinks the provider would need to sign for her to leave the rehab, they are meeting with the provider at rehab today.  She would like for the provider to take over the new medications as well.  Tayden Nichelson,cma

## 2022-09-23 NOTE — Telephone Encounter (Signed)
The provider at [facility] place would be the one to determine when she is ready to leave rehab. Once she leaves rehab I can certainly look at her new medications to determine if they are something that I can prescribe for the patient.

## 2022-09-23 NOTE — Telephone Encounter (Signed)
Patient states [name] stated she will be released on Monday 09/28/22 and she has an appointment with you on 10/05/22 at 11:00. Patient wants to know if she needs to see you sooner than 3/18?

## 2022-09-24 NOTE — Telephone Encounter (Signed)
Spoke to Patient to let her know that if the provider at [facility] states she needs to be seen before 3/18 to call back and let us know and we will work her in somewhere.

## 2022-09-25 DIAGNOSIS — S2249XD Multiple fractures of ribs, unspecified side, subsequent encounter for fracture with routine healing: Secondary | ICD-10-CM | POA: Diagnosis not present

## 2022-09-25 DIAGNOSIS — I482 Chronic atrial fibrillation, unspecified: Secondary | ICD-10-CM | POA: Diagnosis not present

## 2022-09-25 DIAGNOSIS — I5032 Chronic diastolic (congestive) heart failure: Secondary | ICD-10-CM | POA: Diagnosis not present

## 2022-09-25 DIAGNOSIS — J181 Lobar pneumonia, unspecified organism: Secondary | ICD-10-CM | POA: Diagnosis not present

## 2022-09-25 DIAGNOSIS — R2689 Other abnormalities of gait and mobility: Secondary | ICD-10-CM | POA: Diagnosis not present

## 2022-09-25 DIAGNOSIS — M6281 Muscle weakness (generalized): Secondary | ICD-10-CM | POA: Diagnosis not present

## 2022-09-25 DIAGNOSIS — Z9181 History of falling: Secondary | ICD-10-CM | POA: Diagnosis not present

## 2022-09-28 DIAGNOSIS — J90 Pleural effusion, not elsewhere classified: Secondary | ICD-10-CM | POA: Diagnosis not present

## 2022-09-28 DIAGNOSIS — J181 Lobar pneumonia, unspecified organism: Secondary | ICD-10-CM | POA: Diagnosis not present

## 2022-09-28 DIAGNOSIS — R296 Repeated falls: Secondary | ICD-10-CM | POA: Diagnosis not present

## 2022-09-28 DIAGNOSIS — N1831 Chronic kidney disease, stage 3a: Secondary | ICD-10-CM | POA: Diagnosis not present

## 2022-09-28 DIAGNOSIS — S272XXA Traumatic hemopneumothorax, initial encounter: Secondary | ICD-10-CM | POA: Diagnosis not present

## 2022-09-28 DIAGNOSIS — I4811 Longstanding persistent atrial fibrillation: Secondary | ICD-10-CM | POA: Diagnosis not present

## 2022-09-28 DIAGNOSIS — J9601 Acute respiratory failure with hypoxia: Secondary | ICD-10-CM | POA: Diagnosis not present

## 2022-09-28 DIAGNOSIS — S2241XA Multiple fractures of ribs, right side, initial encounter for closed fracture: Secondary | ICD-10-CM | POA: Diagnosis not present

## 2022-09-28 DIAGNOSIS — M6281 Muscle weakness (generalized): Secondary | ICD-10-CM | POA: Diagnosis not present

## 2022-09-28 DIAGNOSIS — I10 Essential (primary) hypertension: Secondary | ICD-10-CM | POA: Diagnosis not present

## 2022-09-28 DIAGNOSIS — I5032 Chronic diastolic (congestive) heart failure: Secondary | ICD-10-CM | POA: Diagnosis not present

## 2022-09-28 DIAGNOSIS — D72829 Elevated white blood cell count, unspecified: Secondary | ICD-10-CM | POA: Diagnosis not present

## 2022-10-01 DIAGNOSIS — K219 Gastro-esophageal reflux disease without esophagitis: Secondary | ICD-10-CM | POA: Diagnosis not present

## 2022-10-01 DIAGNOSIS — Z7901 Long term (current) use of anticoagulants: Secondary | ICD-10-CM | POA: Diagnosis not present

## 2022-10-01 DIAGNOSIS — I5032 Chronic diastolic (congestive) heart failure: Secondary | ICD-10-CM | POA: Diagnosis not present

## 2022-10-01 DIAGNOSIS — N1831 Chronic kidney disease, stage 3a: Secondary | ICD-10-CM | POA: Diagnosis not present

## 2022-10-01 DIAGNOSIS — Z96649 Presence of unspecified artificial hip joint: Secondary | ICD-10-CM | POA: Diagnosis not present

## 2022-10-01 DIAGNOSIS — L409 Psoriasis, unspecified: Secondary | ICD-10-CM | POA: Diagnosis not present

## 2022-10-01 DIAGNOSIS — S2243XD Multiple fractures of ribs, bilateral, subsequent encounter for fracture with routine healing: Secondary | ICD-10-CM | POA: Diagnosis not present

## 2022-10-01 DIAGNOSIS — Z853 Personal history of malignant neoplasm of breast: Secondary | ICD-10-CM | POA: Diagnosis not present

## 2022-10-01 DIAGNOSIS — M8580 Other specified disorders of bone density and structure, unspecified site: Secondary | ICD-10-CM | POA: Diagnosis not present

## 2022-10-01 DIAGNOSIS — M109 Gout, unspecified: Secondary | ICD-10-CM | POA: Diagnosis not present

## 2022-10-01 DIAGNOSIS — Z9181 History of falling: Secondary | ICD-10-CM | POA: Diagnosis not present

## 2022-10-01 DIAGNOSIS — Z87891 Personal history of nicotine dependence: Secondary | ICD-10-CM | POA: Diagnosis not present

## 2022-10-01 DIAGNOSIS — F418 Other specified anxiety disorders: Secondary | ICD-10-CM | POA: Diagnosis not present

## 2022-10-01 DIAGNOSIS — I130 Hypertensive heart and chronic kidney disease with heart failure and stage 1 through stage 4 chronic kidney disease, or unspecified chronic kidney disease: Secondary | ICD-10-CM | POA: Diagnosis not present

## 2022-10-01 DIAGNOSIS — I4821 Permanent atrial fibrillation: Secondary | ICD-10-CM | POA: Diagnosis not present

## 2022-10-01 DIAGNOSIS — M1990 Unspecified osteoarthritis, unspecified site: Secondary | ICD-10-CM | POA: Diagnosis not present

## 2022-10-01 DIAGNOSIS — E785 Hyperlipidemia, unspecified: Secondary | ICD-10-CM | POA: Diagnosis not present

## 2022-10-01 DIAGNOSIS — J449 Chronic obstructive pulmonary disease, unspecified: Secondary | ICD-10-CM | POA: Diagnosis not present

## 2022-10-01 DIAGNOSIS — Z8701 Personal history of pneumonia (recurrent): Secondary | ICD-10-CM | POA: Diagnosis not present

## 2022-10-01 DIAGNOSIS — D631 Anemia in chronic kidney disease: Secondary | ICD-10-CM | POA: Diagnosis not present

## 2022-10-02 DIAGNOSIS — S2243XD Multiple fractures of ribs, bilateral, subsequent encounter for fracture with routine healing: Secondary | ICD-10-CM | POA: Diagnosis not present

## 2022-10-02 DIAGNOSIS — I4821 Permanent atrial fibrillation: Secondary | ICD-10-CM | POA: Diagnosis not present

## 2022-10-02 DIAGNOSIS — D631 Anemia in chronic kidney disease: Secondary | ICD-10-CM | POA: Diagnosis not present

## 2022-10-02 DIAGNOSIS — I130 Hypertensive heart and chronic kidney disease with heart failure and stage 1 through stage 4 chronic kidney disease, or unspecified chronic kidney disease: Secondary | ICD-10-CM | POA: Diagnosis not present

## 2022-10-02 DIAGNOSIS — I5032 Chronic diastolic (congestive) heart failure: Secondary | ICD-10-CM | POA: Diagnosis not present

## 2022-10-02 DIAGNOSIS — N1831 Chronic kidney disease, stage 3a: Secondary | ICD-10-CM | POA: Diagnosis not present

## 2022-10-05 ENCOUNTER — Telehealth: Payer: Self-pay

## 2022-10-05 ENCOUNTER — Encounter: Payer: Self-pay | Admitting: Family Medicine

## 2022-10-05 ENCOUNTER — Ambulatory Visit (INDEPENDENT_AMBULATORY_CARE_PROVIDER_SITE_OTHER): Payer: Medicare Other | Admitting: Family Medicine

## 2022-10-05 VITALS — BP 120/74 | HR 77 | Temp 97.8°F | Ht 67.0 in | Wt 104.4 lb

## 2022-10-05 DIAGNOSIS — J181 Lobar pneumonia, unspecified organism: Secondary | ICD-10-CM | POA: Diagnosis not present

## 2022-10-05 DIAGNOSIS — G479 Sleep disorder, unspecified: Secondary | ICD-10-CM | POA: Insufficient documentation

## 2022-10-05 DIAGNOSIS — I480 Paroxysmal atrial fibrillation: Secondary | ICD-10-CM | POA: Diagnosis not present

## 2022-10-05 DIAGNOSIS — I10 Essential (primary) hypertension: Secondary | ICD-10-CM

## 2022-10-05 DIAGNOSIS — S2241XD Multiple fractures of ribs, right side, subsequent encounter for fracture with routine healing: Secondary | ICD-10-CM | POA: Diagnosis not present

## 2022-10-05 DIAGNOSIS — J942 Hemothorax: Secondary | ICD-10-CM | POA: Diagnosis not present

## 2022-10-05 MED ORDER — TRAZODONE HCL 50 MG PO TABS: 25.0000 mg | ORAL_TABLET | Freq: Every evening | ORAL | 2 refills | Status: DC | PRN

## 2022-10-05 NOTE — Assessment & Plan Note (Signed)
Chronic issue.  Discussed restarting trazodone 25 mg daily.  Advised to monitor for drowsiness with this.  Advised to try this prior to taking her lorazepam.  She should only take the lorazepam on top of the trazodone if she has somebody that is staying with her at home to make sure she does not get excessively drowsy.  If she has excessive drowsiness she will let us know.

## 2022-10-05 NOTE — Assessment & Plan Note (Signed)
Quite a bit improvement on this.  Discussed pain control with Tylenol 650 mg every 6 hours as needed for pain.  Discussed trying to take this before bedtime to help with pain control at night.  If worsening pain she will let us know.  Discussed at this point I do not think there is any role for gabapentin or Robaxin.

## 2022-10-05 NOTE — Assessment & Plan Note (Signed)
Chronic issue.  Sinus rhythm today.  Amiodarone is managed by cardiology she will continue Eliquis 2.5 mg twice daily at this time.  I did encourage them to contact cardiology office to let them know what has been going on given that she is scheduled for a ablation towards the end of April.

## 2022-10-05 NOTE — Telephone Encounter (Signed)
Spoke with the patient and cancelled her ablation. CT scan has also been cancelled. Patient would like to keep follow up with [facility] as scheduled.

## 2022-10-05 NOTE — Telephone Encounter (Signed)
-----   Message from Vickie Epley, MD sent at 10/05/2022  3:49 PM EDT ----- Regarding: RE: ?ablation Agree.  Cancel ablation and plan for follow up with [facility] or [facility] in 3 months.  Thanks, Lars Mage ----- Message ----- From: Juluis Mire, RN Sent: 10/05/2022   1:45 PM EDT To: Vickie Epley, MD; Bernestine Amass, RN Subject: ?ablation                                      Pt had left a message on voicemail stating she has been in hospital/rehab the last 2 months not sure she is medically ok to have ablation and wanted to discuss. [place]

## 2022-10-05 NOTE — Patient Instructions (Addendum)
Nice to see you. You can take Tylenol 650 mg every 6 hours as needed for pain.  It may be a good idea to try taking this before bedtime. We will have you start back on the trazodone 25 mg nightly as needed for sleep.  Do not take the lorazepam while you are taking this unless you have somebody around at night. If you are excessively drowsy with the trazodone or if you take the lorazepam as well you need to discontinue use of these medicines and let us know. We will get lab work today and have you return for an x-ray in several weeks.

## 2022-10-05 NOTE — Assessment & Plan Note (Signed)
Plan for repeat x-ray in 2 weeks to evaluate for resolution of chest x-ray findings.

## 2022-10-05 NOTE — Progress Notes (Signed)
[name], MD Phone: [phone]  [name] is a 82 y.o. female who presents today for follow-up.  Rib fractures/hemopneumothorax: Patient notes she had a fall in her bedroom where she tripped and injured her right ribs.  She was evaluated in the hospital and found to have rib fractures of her right third through 10th ribs and a hemopneumothorax.  She had small T7-T9 transverse process fractures.  Surgery evaluated her and noted no surgery was needed.  Her Eliquis was held though was restarted on 2/6.  She was discharged to a skilled nursing facility.  The patient was treated for pneumonia in the hospital and also notes she was retreated at the skilled nursing facility.  They also told her she had RSV though she noted no symptoms of RSV.  Since discharge from the skilled nursing facility a week ago she has had some pain in her right ribs though notes it is nothing like it was previously.  She has not been taking any gabapentin, Robaxin, or oxycodone since she got home.  She is taking Tylenol 650 mg about twice daily for her pain.  She feels as though the pain may be keeping her up at night.  They also placed her on trazodone to help with sleep and she notes that had been helpful.  She has not been on trazodone since discharge from skilled nursing.  She has been taking some lorazepam at night for sleep though not nightly.  She notes no shortness of breath.  No palpitations.  She has not been on oxygen over the past several weeks per her report.  She was discharged to skilled nursing on 3 L nasal cannula.   Social History   Tobacco Use  Smoking Status Former   Packs/day: 1.00   Years: 40.00   Additional pack years: 0.00   Total pack years: 40.00   Types: Cigarettes   Quit date: 07/21/1995   Years since quitting: 27.2  Smokeless Tobacco Never  Tobacco Comments   Former smoker 05/15/22    Current Outpatient Medications on File Prior to Visit  Medication Sig Dispense Refill    acetaminophen (TYLENOL) 325 MG tablet Take 2 tablets (650 mg total) by mouth every 4 (four) hours as needed for headache or mild pain.     allopurinol (ZYLOPRIM) 100 MG tablet TAKE 1.5 TABLETS (150 MG TOTAL) BY MOUTH DAILY 135 tablet 1   amiodarone (PACERONE) 200 MG tablet Take 1 tablet (200 mg total) by mouth 2 (two) times daily. 90 tablet 1   apixaban (ELIQUIS) 2.5 MG TABS tablet TAKE 1 TABLET BY MOUTH TWICE A DAY 60 tablet 5   cholecalciferol (VITAMIN D) 25 MCG (1000 UNIT) tablet Take 2 tablets (2,000 Units total) by mouth daily.     LORazepam (ATIVAN) 0.5 MG tablet TAKE 1 TABLET BY MOUTH TWICE A DAY AS NEEDED FOR ANXIETY 60 tablet 0   mirtazapine (REMERON) 30 MG tablet Take 1 tablet (30 mg total) by mouth at bedtime. 90 tablet 3   Multiple Vitamin (MULTIVITAMIN) capsule Take 1 capsule by mouth daily.     Multiple Vitamins-Minerals (PRESERVISION AREDS 2+MULTI VIT PO) Take 1 capsule by mouth in the morning and at bedtime.     gabapentin (NEURONTIN) 100 MG capsule Take 1 capsule (100 mg total) by mouth 3 (three) times daily. (Patient not taking: Reported on 10/05/2022)     No current facility-administered medications on file prior to visit.     ROS see history of present illness  Objective  Physical Exam Vitals:   10/05/22 1105  BP: 120/74  Pulse: 77  Temp: 97.8 F (36.6 C)  SpO2: 96%    BP Readings from Last 3 Encounters:  10/05/22 120/74  08/26/22 118/70  08/10/22 (!) 164/75   Wt Readings from Last 3 Encounters:  10/05/22 104 lb 6.4 oz (47.4 kg)  08/26/22 126 lb 5.2 oz (57.3 kg)  08/10/22 113 lb (51.3 kg)    Physical Exam Constitutional:      General: She is not in acute distress.    Appearance: She is not diaphoretic.  Cardiovascular:     Rate and Rhythm: Normal rate and regular rhythm.     Heart sounds: Normal heart sounds.  Pulmonary:     Effort: Pulmonary effort is normal.     Breath sounds: Normal breath sounds.  Musculoskeletal:     Comments: Some tenderness  on the lower ribs of the right mid axillary line  Skin:    General: Skin is warm and dry.  Neurological:     Mental Status: She is alert.      Assessment/Plan: Please see individual problem list.  Paroxysmal atrial fibrillation ([place]) Assessment & Plan: Chronic issue.  Sinus rhythm today.  Amiodarone is managed by cardiology she will continue Eliquis 2.5 mg twice daily at this time.  I did encourage them to contact cardiology office to let them know what has been going on given that she is scheduled for a ablation towards the end of April.  Orders: -     CBC; Future  Hemopneumothorax on right -     DG Chest 2 View; Future -     CBC; Future -     Basic metabolic panel; Future  Lobar pneumonia [facility]) Assessment & Plan: Plan for repeat x-ray in 2 weeks to evaluate for resolution of chest x-ray findings.  Orders: -     DG Chest 2 View; Future -     Basic metabolic panel; Future  Hemopneumothorax Assessment & Plan: Related to multiple rib fractures.  Lungs are clear bilaterally today.  They do report having had a follow-up x-ray at [facility].  We will repeat an x-ray in 2 weeks.   Closed fracture of multiple ribs of right side with routine healing, subsequent encounter Assessment & Plan: Quite a bit improvement on this.  Discussed pain control with Tylenol 650 mg every 6 hours as needed for pain.  Discussed trying to take this before bedtime to help with pain control at night.  If worsening pain she will let us know.  Discussed at this point I do not think there is any role for gabapentin or Robaxin.   Sleeping difficulty Assessment & Plan: Chronic issue.  Discussed restarting trazodone 25 mg daily.  Advised to monitor for drowsiness with this.  Advised to try this prior to taking her lorazepam.  She should only take the lorazepam on top of the trazodone if she has somebody that is staying with her at home to make sure she does not get excessively drowsy.  If she has excessive  drowsiness she will let us know.  Orders: -     traZODone HCl; Take 0.5 tablets (25 mg total) by mouth at bedtime as needed for sleep.  Dispense: 15 tablet; Refill: 2  Primary hypertension Assessment & Plan: Chronic issue.  Blood pressure is acceptable.  She is not currently on any blood pressure medications.  Will continue to monitor this.     Return in about 2 weeks (around  10/19/2022) for Chest x-ray, return as scheduled for follow-u.   [name], MD [place]

## 2022-10-05 NOTE — Assessment & Plan Note (Signed)
Related to multiple rib fractures.  Lungs are clear bilaterally today.  They do report having had a follow-up x-ray at [facility].  We will repeat an x-ray in 2 weeks.

## 2022-10-05 NOTE — Assessment & Plan Note (Signed)
Chronic issue.  Blood pressure is acceptable.  She is not currently on any blood pressure medications.  Will continue to monitor this.

## 2022-10-06 DIAGNOSIS — I4821 Permanent atrial fibrillation: Secondary | ICD-10-CM | POA: Diagnosis not present

## 2022-10-06 DIAGNOSIS — N1831 Chronic kidney disease, stage 3a: Secondary | ICD-10-CM | POA: Diagnosis not present

## 2022-10-06 DIAGNOSIS — D631 Anemia in chronic kidney disease: Secondary | ICD-10-CM | POA: Diagnosis not present

## 2022-10-06 DIAGNOSIS — I5032 Chronic diastolic (congestive) heart failure: Secondary | ICD-10-CM | POA: Diagnosis not present

## 2022-10-06 DIAGNOSIS — I130 Hypertensive heart and chronic kidney disease with heart failure and stage 1 through stage 4 chronic kidney disease, or unspecified chronic kidney disease: Secondary | ICD-10-CM | POA: Diagnosis not present

## 2022-10-06 DIAGNOSIS — S2243XD Multiple fractures of ribs, bilateral, subsequent encounter for fracture with routine healing: Secondary | ICD-10-CM | POA: Diagnosis not present

## 2022-10-09 DIAGNOSIS — I4821 Permanent atrial fibrillation: Secondary | ICD-10-CM | POA: Diagnosis not present

## 2022-10-09 DIAGNOSIS — I5032 Chronic diastolic (congestive) heart failure: Secondary | ICD-10-CM | POA: Diagnosis not present

## 2022-10-09 DIAGNOSIS — N1831 Chronic kidney disease, stage 3a: Secondary | ICD-10-CM | POA: Diagnosis not present

## 2022-10-09 DIAGNOSIS — I130 Hypertensive heart and chronic kidney disease with heart failure and stage 1 through stage 4 chronic kidney disease, or unspecified chronic kidney disease: Secondary | ICD-10-CM | POA: Diagnosis not present

## 2022-10-09 DIAGNOSIS — S2243XD Multiple fractures of ribs, bilateral, subsequent encounter for fracture with routine healing: Secondary | ICD-10-CM | POA: Diagnosis not present

## 2022-10-09 DIAGNOSIS — D631 Anemia in chronic kidney disease: Secondary | ICD-10-CM | POA: Diagnosis not present

## 2022-10-12 DIAGNOSIS — R2689 Other abnormalities of gait and mobility: Secondary | ICD-10-CM | POA: Diagnosis not present

## 2022-10-12 DIAGNOSIS — E46 Unspecified protein-calorie malnutrition: Secondary | ICD-10-CM | POA: Diagnosis not present

## 2022-10-14 DIAGNOSIS — I5032 Chronic diastolic (congestive) heart failure: Secondary | ICD-10-CM | POA: Diagnosis not present

## 2022-10-14 DIAGNOSIS — N1831 Chronic kidney disease, stage 3a: Secondary | ICD-10-CM | POA: Diagnosis not present

## 2022-10-14 DIAGNOSIS — I130 Hypertensive heart and chronic kidney disease with heart failure and stage 1 through stage 4 chronic kidney disease, or unspecified chronic kidney disease: Secondary | ICD-10-CM | POA: Diagnosis not present

## 2022-10-14 DIAGNOSIS — S2243XD Multiple fractures of ribs, bilateral, subsequent encounter for fracture with routine healing: Secondary | ICD-10-CM | POA: Diagnosis not present

## 2022-10-14 DIAGNOSIS — I4821 Permanent atrial fibrillation: Secondary | ICD-10-CM | POA: Diagnosis not present

## 2022-10-14 DIAGNOSIS — D631 Anemia in chronic kidney disease: Secondary | ICD-10-CM | POA: Diagnosis not present

## 2022-10-15 ENCOUNTER — Telehealth: Payer: Self-pay | Admitting: Family Medicine

## 2022-10-15 NOTE — Telephone Encounter (Signed)
Pt want to be called regarding her medication-Amlodipine and Furosamide. Pt need to know if she should take these and what they are for

## 2022-10-15 NOTE — Telephone Encounter (Signed)
Spoke with pt and she had several questions about what medications she should be taking and not taking since being out of rehab. Pt was advised to schedule an appointment and it has been scheduled for Monday.

## 2022-10-16 DIAGNOSIS — S2243XD Multiple fractures of ribs, bilateral, subsequent encounter for fracture with routine healing: Secondary | ICD-10-CM | POA: Diagnosis not present

## 2022-10-16 DIAGNOSIS — D631 Anemia in chronic kidney disease: Secondary | ICD-10-CM | POA: Diagnosis not present

## 2022-10-16 DIAGNOSIS — I5032 Chronic diastolic (congestive) heart failure: Secondary | ICD-10-CM | POA: Diagnosis not present

## 2022-10-16 DIAGNOSIS — I4821 Permanent atrial fibrillation: Secondary | ICD-10-CM | POA: Diagnosis not present

## 2022-10-16 DIAGNOSIS — I130 Hypertensive heart and chronic kidney disease with heart failure and stage 1 through stage 4 chronic kidney disease, or unspecified chronic kidney disease: Secondary | ICD-10-CM | POA: Diagnosis not present

## 2022-10-16 DIAGNOSIS — N1831 Chronic kidney disease, stage 3a: Secondary | ICD-10-CM | POA: Diagnosis not present

## 2022-10-19 ENCOUNTER — Ambulatory Visit (INDEPENDENT_AMBULATORY_CARE_PROVIDER_SITE_OTHER): Payer: Medicare Other | Admitting: Family Medicine

## 2022-10-19 ENCOUNTER — Telehealth: Payer: Self-pay

## 2022-10-19 ENCOUNTER — Encounter: Payer: Self-pay | Admitting: Family Medicine

## 2022-10-19 ENCOUNTER — Other Ambulatory Visit: Payer: Medicare Other

## 2022-10-19 ENCOUNTER — Ambulatory Visit (INDEPENDENT_AMBULATORY_CARE_PROVIDER_SITE_OTHER): Payer: Medicare Other

## 2022-10-19 VITALS — BP 104/72 | HR 131 | Temp 98.2°F | Ht 67.0 in | Wt 106.8 lb

## 2022-10-19 DIAGNOSIS — R053 Chronic cough: Secondary | ICD-10-CM | POA: Diagnosis not present

## 2022-10-19 DIAGNOSIS — J811 Chronic pulmonary edema: Secondary | ICD-10-CM | POA: Diagnosis not present

## 2022-10-19 DIAGNOSIS — R0602 Shortness of breath: Secondary | ICD-10-CM | POA: Diagnosis not present

## 2022-10-19 DIAGNOSIS — I10 Essential (primary) hypertension: Secondary | ICD-10-CM | POA: Diagnosis not present

## 2022-10-19 DIAGNOSIS — I480 Paroxysmal atrial fibrillation: Secondary | ICD-10-CM

## 2022-10-19 DIAGNOSIS — R0789 Other chest pain: Secondary | ICD-10-CM

## 2022-10-19 DIAGNOSIS — J181 Lobar pneumonia, unspecified organism: Secondary | ICD-10-CM

## 2022-10-19 LAB — CBC
HCT: 36.9 % (ref 36.0–46.0)
Hemoglobin: 12.2 g/dL (ref 12.0–15.0)
MCHC: 33.1 g/dL (ref 30.0–36.0)
MCV: 101.8 fl — ABNORMAL HIGH (ref 78.0–100.0)
Platelets: 321 10*3/uL (ref 150.0–400.0)
RBC: 3.63 Mil/uL — ABNORMAL LOW (ref 3.87–5.11)
RDW: 15.8 % — ABNORMAL HIGH (ref 11.5–15.5)
WBC: 7.6 10*3/uL (ref 4.0–10.5)

## 2022-10-19 LAB — BASIC METABOLIC PANEL
BUN: 21 mg/dL (ref 6–23)
CO2: 29 mEq/L (ref 19–32)
Calcium: 9.6 mg/dL (ref 8.4–10.5)
Chloride: 99 mEq/L (ref 96–112)
Creatinine, Ser: 1.21 mg/dL — ABNORMAL HIGH (ref 0.40–1.20)
GFR: 41.42 mL/min — ABNORMAL LOW (ref >60.00)
Glucose, Bld: 93 mg/dL (ref 70–99)
Potassium: 4 mEq/L (ref 3.5–5.1)
Sodium: 137 mEq/L (ref 135–145)

## 2022-10-19 LAB — TSH: TSH: 2.85 u[IU]/mL (ref 0.35–5.50)

## 2022-10-19 LAB — TROPONIN I (HIGH SENSITIVITY): High Sens Troponin I: 16 ng/L (ref 2–17)

## 2022-10-19 NOTE — Patient Instructions (Addendum)
Nice to see you. I sent a message to Dr. Quentin Ore and I will contact you when he responds. Will get lab work today and contact you with the results. If you develop persistent chest heaviness please seek medical attention immediately.

## 2022-10-19 NOTE — Progress Notes (Signed)
[name], MD Phone: [phone]  [name] is a 82 y.o. female who presents today for f/u.  Atrial fibrillation: Patient notes she does not ever feel her A-fib.  Has not had any palpitations.  She has had a little heaviness in her chest the last couple of days that occurs intermittently.  It is not occurring often.  It occurs typically when she is being active.  She did take some Pepto-Bismol and felt as though that was helpful.  She notes nothing specific seems to bring it on.  She would describe it as indigestion.  She notes no excessive fatigue.  She was supposed to have an ablation at the end of April though this was canceled.  She notes it was canceled as she was doing better after rehab.  Hypertension: Patient notes she found some amlodipine, Lasix, and losartan at her house and wonders if she should be on these.  She notes it has been quite some time since she has taken any losartan and notes she has been off the amlodipine for at least several weeks.  She has not taken any Lasix recently either.  History of pneumonia: Patient due for follow-up x-ray.  Social History   Tobacco Use  Smoking Status Former   Packs/day: 1.00   Years: 40.00   Additional pack years: 0.00   Total pack years: 40.00   Types: Cigarettes   Quit date: 07/21/1995   Years since quitting: 27.2  Smokeless Tobacco Never  Tobacco Comments   Former smoker 05/15/22    Current Outpatient Medications on File Prior to Visit  Medication Sig Dispense Refill   acetaminophen (TYLENOL) 325 MG tablet Take 2 tablets (650 mg total) by mouth every 4 (four) hours as needed for headache or mild pain.     allopurinol (ZYLOPRIM) 100 MG tablet TAKE 1.5 TABLETS (150 MG TOTAL) BY MOUTH DAILY 135 tablet 1   amiodarone (PACERONE) 200 MG tablet Take 1 tablet (200 mg total) by mouth 2 (two) times daily. 90 tablet 1   apixaban (ELIQUIS) 2.5 MG TABS tablet TAKE 1 TABLET BY MOUTH TWICE A DAY 60 tablet 5   cholecalciferol  (VITAMIN D) 25 MCG (1000 UNIT) tablet Take 2 tablets (2,000 Units total) by mouth daily.     LORazepam (ATIVAN) 0.5 MG tablet TAKE 1 TABLET BY MOUTH TWICE A DAY AS NEEDED FOR ANXIETY 60 tablet 0   mirtazapine (REMERON) 30 MG tablet Take 1 tablet (30 mg total) by mouth at bedtime. 90 tablet 3   Multiple Vitamin (MULTIVITAMIN) capsule Take 1 capsule by mouth daily.     Multiple Vitamins-Minerals (PRESERVISION AREDS 2+MULTI VIT PO) Take 1 capsule by mouth in the morning and at bedtime.     traZODone (DESYREL) 50 MG tablet Take 0.5 tablets (25 mg total) by mouth at bedtime as needed for sleep. 15 tablet 2   gabapentin (NEURONTIN) 100 MG capsule Take 1 capsule (100 mg total) by mouth 3 (three) times daily. (Patient not taking: Reported on 10/05/2022)     No current facility-administered medications on file prior to visit.     ROS see history of present illness  Objective  Physical Exam Vitals:   10/19/22 1115  BP: 104/72  Pulse: (!) 131  Temp: 98.2 F (36.8 C)  SpO2: 95%    BP Readings from Last 3 Encounters:  10/19/22 104/72  10/05/22 120/74  08/26/22 118/70   Wt Readings from Last 3 Encounters:  10/19/22 106 lb 12.8 oz (48.4 kg)  10/05/22  104 lb 6.4 oz (47.4 kg)  08/26/22 126 lb 5.2 oz (57.3 kg)    Physical Exam Constitutional:      General: She is not in acute distress.    Appearance: She is not diaphoretic.  Cardiovascular:     Rate and Rhythm: Tachycardia present. Rhythm irregularly irregular.     Heart sounds: Normal heart sounds.  Pulmonary:     Effort: Pulmonary effort is normal.     Breath sounds: Normal breath sounds.  Skin:    General: Skin is warm and dry.  Neurological:     Mental Status: She is alert.    EKG: Atrial fibrillation, rate 117, voltage criteria for LVH noted, nonspecific ST depression V4 through V6 though she does have baseline wander and the voltage criteria in these leads that could be contributing to the appearance of the ST  depression  Assessment/Plan: Please see individual problem list.  Primary hypertension Assessment & Plan: Chronic issue.  Incredibly well-controlled today.  She is no longer on any medications for blood pressure.  Discussed she does not need to resume the amlodipine, Lasix, or losartan at this time.  Orders: -     Basic metabolic panel  Lobar pneumonia Assessment & Plan: Patient is due for repeat x-ray to evaluate for resolution of chest x-ray findings.  She will have this completed today.  Orders: -     DG Chest 2 View; Future  Paroxysmal atrial fibrillation Assessment & Plan: Chronic issue.  Rate is up today.  Possibly related to missing her medication last night.  Given her chest heaviness that has intermittently been going on over the last few days I am going to get a troponin to help rule out underlying ischemic issues.  Lab work will also be obtained to see if there is something causing her rate to be up as well.  I did message her EP physician regarding her elevated rate to get their input and am awaiting their response.  I advised the patient we would contact her once her EP physician response.  She was advised to seek medical attention immediately if she develops persistent chest heaviness.  Orders: -     EKG 12-Lead -     CBC -     Basic metabolic panel -     TSH  Chest heaviness -     Troponin I (High Sensitivity)     Return in about 3 months (around 01/18/2023).   [name], MD [place]

## 2022-10-19 NOTE — Telephone Encounter (Signed)
Left message to call back regarding her Troponin level is negative which means she is not currently having a heart attack

## 2022-10-19 NOTE — Assessment & Plan Note (Signed)
Chronic issue.  Rate is up today.  Possibly related to missing her medication last night.  Given her chest heaviness that has intermittently been going on over the last few days I am going to get a troponin to help rule out underlying ischemic issues.  Lab work will also be obtained to see if there is something causing her rate to be up as well.  I did message her EP physician regarding her elevated rate to get their input and am awaiting their response.  I advised the patient we would contact her once her EP physician response.  She was advised to seek medical attention immediately if she develops persistent chest heaviness.

## 2022-10-19 NOTE — Assessment & Plan Note (Signed)
Patient is due for repeat x-ray to evaluate for resolution of chest x-ray findings.  She will have this completed today.

## 2022-10-19 NOTE — Telephone Encounter (Signed)
-----   Message from [name], MD sent at 10/19/2022  3:15 PM EDT ----- Please let the patient know that her troponin was negative.  This indicates she is not currently having a heart attack.

## 2022-10-19 NOTE — Assessment & Plan Note (Signed)
Chronic issue.  Incredibly well-controlled today.  She is no longer on any medications for blood pressure.  Discussed she does not need to resume the amlodipine, Lasix, or losartan at this time.

## 2022-10-20 NOTE — Telephone Encounter (Signed)
Pt returned Jennings call. Unable to transfer. Note below was read to her. As per pt, she confused about previous message. Pt will like to F/U with [facility] assistant.

## 2022-10-20 NOTE — Telephone Encounter (Signed)
Pt called back regarding previous message. As per pt, she would like to speak w/ provider or his assistant. As per pt, she's worried, don't want nothing happening to her.

## 2022-10-20 NOTE — Telephone Encounter (Signed)
Her electrophysiology cardiologist messaged back last night a noted he would have the afib clinic call her today to check on her and see about any symptoms. If they have not reached out to her we can forward this again to cardiology to get their input. If she is feeling short of breath, any palpitations, or chest pain she needs to be evaluated again.

## 2022-10-20 NOTE — Telephone Encounter (Signed)
Spoke to Patient yesterday to let her know the Troponin was negative which means no current heart attack.

## 2022-10-20 NOTE — Telephone Encounter (Signed)
Pt is calling the cma back regarding below message. I am unable to read message due to understanding of words

## 2022-10-20 NOTE — Telephone Encounter (Signed)
Patient called the office upset stating she had AFIB last night and today and would like to know what is Dr. Ellen Henri recommendations on this? "Does she need to see cardio or what because she does not want to end up in the hospital."

## 2022-10-20 NOTE — Telephone Encounter (Signed)
LMTCB regarding Dr. Sonnenberg's message 

## 2022-10-21 DIAGNOSIS — I130 Hypertensive heart and chronic kidney disease with heart failure and stage 1 through stage 4 chronic kidney disease, or unspecified chronic kidney disease: Secondary | ICD-10-CM | POA: Diagnosis not present

## 2022-10-21 DIAGNOSIS — R2689 Other abnormalities of gait and mobility: Secondary | ICD-10-CM | POA: Diagnosis not present

## 2022-10-21 DIAGNOSIS — I4821 Permanent atrial fibrillation: Secondary | ICD-10-CM | POA: Diagnosis not present

## 2022-10-21 DIAGNOSIS — E46 Unspecified protein-calorie malnutrition: Secondary | ICD-10-CM | POA: Diagnosis not present

## 2022-10-21 DIAGNOSIS — N1831 Chronic kidney disease, stage 3a: Secondary | ICD-10-CM | POA: Diagnosis not present

## 2022-10-21 DIAGNOSIS — I5032 Chronic diastolic (congestive) heart failure: Secondary | ICD-10-CM | POA: Diagnosis not present

## 2022-10-21 DIAGNOSIS — D631 Anemia in chronic kidney disease: Secondary | ICD-10-CM | POA: Diagnosis not present

## 2022-10-21 DIAGNOSIS — S2243XD Multiple fractures of ribs, bilateral, subsequent encounter for fracture with routine healing: Secondary | ICD-10-CM | POA: Diagnosis not present

## 2022-10-21 NOTE — Telephone Encounter (Signed)
LMTCB

## 2022-10-21 NOTE — Telephone Encounter (Signed)
Just finally  spoke to the Patient she states she is still in AFIB per her Apple Watch but the alarm is not going off now. Patient would like for you to send another message to Cardiology. Patient denies any SOB, chest pain, headache or blurred vision but does agree to go to be evaluated if she gets symptomatic.

## 2022-10-21 NOTE — Telephone Encounter (Signed)
Left another message to call back

## 2022-10-21 NOTE — Telephone Encounter (Signed)
Noted. I will forward the message to Dr Quentin Ore so he can have the afib clinic call the patient as previously discussed.

## 2022-10-22 ENCOUNTER — Ambulatory Visit (INDEPENDENT_AMBULATORY_CARE_PROVIDER_SITE_OTHER): Payer: Medicare Other | Admitting: Podiatry

## 2022-10-22 ENCOUNTER — Encounter: Payer: Self-pay | Admitting: Podiatry

## 2022-10-22 DIAGNOSIS — B351 Tinea unguium: Secondary | ICD-10-CM

## 2022-10-22 DIAGNOSIS — D689 Coagulation defect, unspecified: Secondary | ICD-10-CM | POA: Diagnosis not present

## 2022-10-22 DIAGNOSIS — M79676 Pain in unspecified toe(s): Secondary | ICD-10-CM | POA: Diagnosis not present

## 2022-10-22 DIAGNOSIS — N1831 Chronic kidney disease, stage 3a: Secondary | ICD-10-CM | POA: Diagnosis not present

## 2022-10-22 DIAGNOSIS — D631 Anemia in chronic kidney disease: Secondary | ICD-10-CM | POA: Diagnosis not present

## 2022-10-22 DIAGNOSIS — I130 Hypertensive heart and chronic kidney disease with heart failure and stage 1 through stage 4 chronic kidney disease, or unspecified chronic kidney disease: Secondary | ICD-10-CM | POA: Diagnosis not present

## 2022-10-22 DIAGNOSIS — S2243XD Multiple fractures of ribs, bilateral, subsequent encounter for fracture with routine healing: Secondary | ICD-10-CM | POA: Diagnosis not present

## 2022-10-22 DIAGNOSIS — I5032 Chronic diastolic (congestive) heart failure: Secondary | ICD-10-CM | POA: Diagnosis not present

## 2022-10-22 DIAGNOSIS — I4821 Permanent atrial fibrillation: Secondary | ICD-10-CM | POA: Diagnosis not present

## 2022-10-22 NOTE — Progress Notes (Signed)
This patient returns to my office for at risk foot care.  This patient requires this care by a professional since this patient will be at risk due to having lymphedema.  This patient is unable to cut nails herself since the patient cannot reach her nails.These nails are painful walking and wearing shoes.  This patient presents for at risk foot care today. ? ?General Appearance  Alert, conversant and in no acute stress. ? ?Vascular  Dorsalis pedis and posterior tibial  pulses are palpable  bilaterally.  Capillary return is within normal limits  bilaterally. Temperature is within normal limits  bilaterally. ? ?Neurologic  Senn-Weinstein monofilament wire test within normal limits  bilaterally. Muscle power within normal limits bilaterally. ? ?Nails Thick disfigured discolored nails with subungual debris  from hallux to fifth toes bilaterally. No evidence of bacterial infection or drainage bilaterally. ? ?Orthopedic  No limitations of motion  feet .  No crepitus or effusions noted.  No bony pathology or digital deformities noted.  HAV  B/L.  Mallet toe third toe left foot. ? ?Skin  normotropic skin with no porokeratosis noted bilaterally.  No signs of infections or ulcers noted.    ? ?Onychomycosis  Pain in right toes  Pain in left toes ? ?Consent was obtained for treatment procedures.   Mechanical debridement of nails 1-5  bilaterally performed with a nail nipper.  Filed with dremel without incident.   ? ? ?Return office visit    3 months                  Told patient to return for periodic foot care and evaluation due to potential at risk complications. ? ? ?Travante Knee DPM  ?

## 2022-10-23 ENCOUNTER — Other Ambulatory Visit: Payer: Self-pay | Admitting: Family Medicine

## 2022-10-23 DIAGNOSIS — F419 Anxiety disorder, unspecified: Secondary | ICD-10-CM

## 2022-10-23 MED ORDER — METOPROLOL TARTRATE 25 MG PO TABS: 25.0000 mg | ORAL_TABLET | Freq: Two times a day (BID) | ORAL | 0 refills | Status: DC

## 2022-10-23 NOTE — Telephone Encounter (Signed)
Requesting: Lorazepam 0.5 mg Contract: No UDS: no Last Visit: 10/19/2022 Next Visit: 01/19/2023 Last Refill: 06/09/22  Please Advise

## 2022-10-23 NOTE — Telephone Encounter (Signed)
I can send in a medication called metoprolol for her to start on that should help slow her heart rate down some and hopefully help her feel better. I will also forward the message again to her cardiology team to get their input. If she is having chest pain, shortness of breath, her heart rate remains elevated in the 130s-140s, or if she is feeling poorly enough she needs to go to the ED.

## 2022-10-23 NOTE — Addendum Note (Signed)
Addended by: Glori Luis on: 10/23/2022 09:20 AM   Modules accepted: Orders

## 2022-10-23 NOTE — Telephone Encounter (Signed)
LMTCB

## 2022-10-23 NOTE — Telephone Encounter (Signed)
Left message to call the office back regarding Dr. Purvis Sheffield message about medication

## 2022-10-23 NOTE — Telephone Encounter (Signed)
Spoke to the Patient again she is still in AFIB but she feels a little better due to you calling in the Metoprolol. Patient thought she was already on this medication. I got her straighten out on the medication confusion. Patient still would like for Afib clinic to call her.

## 2022-10-26 NOTE — Telephone Encounter (Signed)
Duke Salvia, MD [phone])  Sent: Mon October 26, 2022 11:02 AM  To: Glori Luis, MD; Jefferey Pica, RN; Dalia Heading         Message  Ladies  can we see this lady later this week to sort out what is going on weith her "afib" stuff  Thanks SK

## 2022-10-26 NOTE — Telephone Encounter (Signed)
I spoke with the patient.  I have offered her an in office visit with Dr. Graciela Husbands tomorrow, 4/9 at 8:40 am in the [place] office. The patient voices understanding and is agreeable. She was very appreciative of the call back.

## 2022-10-27 ENCOUNTER — Encounter: Payer: Self-pay | Admitting: Internal Medicine

## 2022-10-27 ENCOUNTER — Telehealth: Payer: Self-pay | Admitting: Internal Medicine

## 2022-10-27 ENCOUNTER — Ambulatory Visit: Payer: Medicare Other | Attending: Internal Medicine | Admitting: Internal Medicine

## 2022-10-27 ENCOUNTER — Ambulatory Visit
Admission: RE | Admit: 2022-10-27 | Discharge: 2022-10-27 | Disposition: A | Discharge status: Home / Self Care | Payer: Medicare Other | Source: Ambulatory Visit | Attending: Internal Medicine | Admitting: Internal Medicine

## 2022-10-27 VITALS — BP 126/88 | HR 114 | Ht 67.0 in | Wt 109.4 lb

## 2022-10-27 DIAGNOSIS — I4821 Permanent atrial fibrillation: Secondary | ICD-10-CM | POA: Diagnosis not present

## 2022-10-27 DIAGNOSIS — E871 Hypo-osmolality and hyponatremia: Secondary | ICD-10-CM | POA: Diagnosis present

## 2022-10-27 DIAGNOSIS — I4819 Other persistent atrial fibrillation: Secondary | ICD-10-CM | POA: Diagnosis not present

## 2022-10-27 DIAGNOSIS — M8580 Other specified disorders of bone density and structure, unspecified site: Secondary | ICD-10-CM | POA: Diagnosis present

## 2022-10-27 DIAGNOSIS — L409 Psoriasis, unspecified: Secondary | ICD-10-CM | POA: Diagnosis present

## 2022-10-27 DIAGNOSIS — I499 Cardiac arrhythmia, unspecified: Secondary | ICD-10-CM | POA: Diagnosis not present

## 2022-10-27 DIAGNOSIS — E876 Hypokalemia: Secondary | ICD-10-CM | POA: Diagnosis present

## 2022-10-27 DIAGNOSIS — R569 Unspecified convulsions: Secondary | ICD-10-CM | POA: Diagnosis not present

## 2022-10-27 DIAGNOSIS — I110 Hypertensive heart disease with heart failure: Secondary | ICD-10-CM | POA: Diagnosis present

## 2022-10-27 DIAGNOSIS — I4729 Other ventricular tachycardia: Secondary | ICD-10-CM | POA: Diagnosis not present

## 2022-10-27 DIAGNOSIS — Z4682 Encounter for fitting and adjustment of non-vascular catheter: Secondary | ICD-10-CM | POA: Diagnosis not present

## 2022-10-27 DIAGNOSIS — B974 Respiratory syncytial virus as the cause of diseases classified elsewhere: Secondary | ICD-10-CM | POA: Diagnosis present

## 2022-10-27 DIAGNOSIS — I428 Other cardiomyopathies: Secondary | ICD-10-CM | POA: Diagnosis present

## 2022-10-27 DIAGNOSIS — E785 Hyperlipidemia, unspecified: Secondary | ICD-10-CM | POA: Diagnosis present

## 2022-10-27 DIAGNOSIS — E872 Acidosis, unspecified: Secondary | ICD-10-CM | POA: Diagnosis present

## 2022-10-27 DIAGNOSIS — N1831 Chronic kidney disease, stage 3a: Secondary | ICD-10-CM | POA: Diagnosis not present

## 2022-10-27 DIAGNOSIS — Z7189 Other specified counseling: Secondary | ICD-10-CM | POA: Diagnosis not present

## 2022-10-27 DIAGNOSIS — R519 Headache, unspecified: Secondary | ICD-10-CM | POA: Diagnosis not present

## 2022-10-27 DIAGNOSIS — Z96649 Presence of unspecified artificial hip joint: Secondary | ICD-10-CM | POA: Diagnosis present

## 2022-10-27 DIAGNOSIS — I5021 Acute systolic (congestive) heart failure: Secondary | ICD-10-CM | POA: Diagnosis not present

## 2022-10-27 DIAGNOSIS — R739 Hyperglycemia, unspecified: Secondary | ICD-10-CM | POA: Diagnosis present

## 2022-10-27 DIAGNOSIS — R001 Bradycardia, unspecified: Secondary | ICD-10-CM | POA: Diagnosis not present

## 2022-10-27 DIAGNOSIS — R000 Tachycardia, unspecified: Secondary | ICD-10-CM | POA: Diagnosis not present

## 2022-10-27 DIAGNOSIS — J9601 Acute respiratory failure with hypoxia: Secondary | ICD-10-CM | POA: Diagnosis present

## 2022-10-27 DIAGNOSIS — I4811 Longstanding persistent atrial fibrillation: Secondary | ICD-10-CM | POA: Diagnosis present

## 2022-10-27 DIAGNOSIS — J90 Pleural effusion, not elsewhere classified: Secondary | ICD-10-CM | POA: Diagnosis not present

## 2022-10-27 DIAGNOSIS — S2243XD Multiple fractures of ribs, bilateral, subsequent encounter for fracture with routine healing: Secondary | ICD-10-CM | POA: Diagnosis not present

## 2022-10-27 DIAGNOSIS — Z515 Encounter for palliative care: Secondary | ICD-10-CM | POA: Diagnosis not present

## 2022-10-27 DIAGNOSIS — R188 Other ascites: Secondary | ICD-10-CM | POA: Diagnosis not present

## 2022-10-27 DIAGNOSIS — D631 Anemia in chronic kidney disease: Secondary | ICD-10-CM | POA: Diagnosis not present

## 2022-10-27 DIAGNOSIS — R0689 Other abnormalities of breathing: Secondary | ICD-10-CM | POA: Diagnosis not present

## 2022-10-27 DIAGNOSIS — R55 Syncope and collapse: Secondary | ICD-10-CM | POA: Diagnosis not present

## 2022-10-27 DIAGNOSIS — I130 Hypertensive heart and chronic kidney disease with heart failure and stage 1 through stage 4 chronic kidney disease, or unspecified chronic kidney disease: Secondary | ICD-10-CM | POA: Diagnosis not present

## 2022-10-27 DIAGNOSIS — K219 Gastro-esophageal reflux disease without esophagitis: Secondary | ICD-10-CM | POA: Diagnosis present

## 2022-10-27 DIAGNOSIS — I5043 Acute on chronic combined systolic (congestive) and diastolic (congestive) heart failure: Secondary | ICD-10-CM | POA: Diagnosis present

## 2022-10-27 DIAGNOSIS — I959 Hypotension, unspecified: Secondary | ICD-10-CM | POA: Diagnosis not present

## 2022-10-27 DIAGNOSIS — I5032 Chronic diastolic (congestive) heart failure: Secondary | ICD-10-CM | POA: Diagnosis not present

## 2022-10-27 DIAGNOSIS — R748 Abnormal levels of other serum enzymes: Secondary | ICD-10-CM | POA: Diagnosis not present

## 2022-10-27 DIAGNOSIS — R918 Other nonspecific abnormal finding of lung field: Secondary | ICD-10-CM | POA: Diagnosis not present

## 2022-10-27 DIAGNOSIS — I472 Ventricular tachycardia, unspecified: Secondary | ICD-10-CM | POA: Diagnosis not present

## 2022-10-27 DIAGNOSIS — Z66 Do not resuscitate: Secondary | ICD-10-CM | POA: Diagnosis present

## 2022-10-27 DIAGNOSIS — J690 Pneumonitis due to inhalation of food and vomit: Secondary | ICD-10-CM | POA: Diagnosis present

## 2022-10-27 DIAGNOSIS — Z1152 Encounter for screening for COVID-19: Secondary | ICD-10-CM | POA: Diagnosis not present

## 2022-10-27 DIAGNOSIS — Z452 Encounter for adjustment and management of vascular access device: Secondary | ICD-10-CM | POA: Diagnosis not present

## 2022-10-27 DIAGNOSIS — I469 Cardiac arrest, cause unspecified: Secondary | ICD-10-CM | POA: Diagnosis not present

## 2022-10-27 DIAGNOSIS — J9691 Respiratory failure, unspecified with hypoxia: Secondary | ICD-10-CM | POA: Diagnosis not present

## 2022-10-27 MED ORDER — METOPROLOL TARTRATE 25 MG PO TABS: 25.0000 mg | ORAL_TABLET | Freq: Three times a day (TID) | ORAL | 0 refills | Status: DC

## 2022-10-27 MED ORDER — AMOXICILLIN-POT CLAVULANATE 500-125 MG PO TABS: 1.0000 | ORAL_TABLET | Freq: Two times a day (BID) | ORAL | 0 refills | Status: DC

## 2022-10-27 MED ORDER — PREDNISONE 20 MG PO TABS: 40.0000 mg | ORAL_TABLET | Freq: Every day | ORAL | 0 refills | Status: DC

## 2022-10-27 NOTE — Telephone Encounter (Signed)
Patient came by office - just completed her head CT Would like to know if there is anything that Dr Graciela Husbands can send in to help with her headache  Please call to discuss

## 2022-10-27 NOTE — Telephone Encounter (Signed)
The CT did not reveal any intracranial bleeding which is good. She appears to have some sinus issues on the scan. Please call the patient and specifically see what symptoms she is having. Is she having sinus congestion? Fevers? Cough? Other respiratory symptoms? Numbness, weakness, vision changes? Where are her headaches located? Does anything make her headaches worse?

## 2022-10-27 NOTE — Progress Notes (Signed)
Patient ID: [name], female   DOB: 1941/02/24, 82 y.o.   MRN: [id number]       Patient Care Team: Glori Luis, MD as PCP - General (Family Medicine) Duke Salvia, MD as PCP - Cardiology (Cardiology) Lanier Prude, MD as PCP - Electrophysiology (Cardiology) Glori Luis, MD (Family Medicine)   HPI   [name] is a 82 y.o. female seen in follow-up for recurrent atrial fibrillation, treated with  flecainide >> Has had hospitalizations related to HFpEF with rapid AF. Recurrent cardioversions. Most recently 3/22 at which time the flecainide was replaced with amiodarone. Anticoagulation with dabigitran no bleeding.  With above, referred for RFCA >>CL 10/23 with posterior wall also ablated.  But had relatively rapid recurrence with >> increase amio>> DCCV 1/24   9/21 ER following a fall while Inebriated ( ER BAL >200)  Had reduced her alcohol by report,  Had been tolerating amio without atrial fib but calls recently with complaints     Metoprolol 25 twice daily was initiated 10/23/2022 and amiodarone has been continued at 200 twice daily    Patient denies symptoms of respiratory, GI intolerance, sun sensitivity, neurological symptoms attributable to amiodarone.      Recurrent symptomatic tachypalpitations associated with lightheadedness and shortness of breath.  A little bit frustrated at the poor communication with cardiology.  She had canceled her ablation at the end of April.  Here today with one of the worst headaches of her life of about 3 days duration   Intercurrent fall 1/24 (BAL not measured)    DATE TEST EF   2/19 Echo   65 % LAE (48/2.8/46)  9/21 Echo  60-65%   3/22 Echo 50-55% LA normal   6/23 Echo  60-65%     Date Cr K TSH LFT  Hgb  2/19 0.8 3.9   12.5  5/19  1.12 3.8     6/20 0.97 4.4   12.4  12/20 0.9  (CE) 2/21 4.2    10.6  9/21 1.11<<1.7 5.0   10.3<<8.5  6/22 1.08 4.6 2.65 (7/22) 19 (7/22) 11.1  5/23 1.03 3.8 3.64 (4/23)  77 10.0  10/23 1.18 3.7 2.58 (8/23) 59 12.7           Thromboembolic risk factors ( age  -2, HTN-1, Vasc disease -1, Gender-1) for a CHADSVASc Score of 5   husband past away in 2019  Past Surgical History:  Procedure Laterality Date   ABDOMINAL HYSTERECTOMY     ANKLE FRACTURE SURGERY  4/08   left---hardware still in place   ATRIAL FIBRILLATION ABLATION N/A 05/01/2022   Procedure: ATRIAL FIBRILLATION ABLATION;  Surgeon: Lanier Prude, MD;  Location: [facility];  Service: Cardiovascular;  Laterality: N/A;   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   BREAST EXCISIONAL BIOPSY Left yrs ago   benign   BREAST LUMPECTOMY Left 2001   f/u radiation   CARDIOVERSION N/A 10/04/2017   Procedure: CARDIOVERSION;  Surgeon: Iran Ouch, MD;  Location: [facility] OR[facility];  Service: Cardiovascular;  Laterality: N/A;   CARDIOVERSION N/A 12/16/2017   Procedure: CARDIOVERSION;  Surgeon: Duke Salvia, MD;  Location: [facility] OR[facility];  Service: Cardiovascular;  Laterality: N/A;   CARDIOVERSION N/A 06/24/2020   Procedure: CARDIOVERSION;  Surgeon: Iran Ouch, MD;  Location: [facility] OR[facility];  Service: Cardiovascular;  Laterality: N/A;   CARDIOVERSION N/A 08/10/2022   Procedure: CARDIOVERSION;  Surgeon: Iran Ouch, MD;  Location: [facility] OR[facility];  Service:  Cardiovascular;  Laterality: N/A;   CAROTID ENDARTERECTOMY Left 10/23/2004   FRACTURE SURGERY     OOPHORECTOMY     SHOULDER SURGERY  6/07   left   TONSILLECTOMY AND ADENOIDECTOMY     TOTAL HIP ARTHROPLASTY  2004   right    Current Outpatient Medications  Medication Sig Dispense Refill   acetaminophen (TYLENOL) 325 MG tablet Take 2 tablets (650 mg total) by mouth every 4 (four) hours as needed for headache or mild pain.     allopurinol (ZYLOPRIM) 100 MG tablet TAKE 1.5 TABLETS (150 MG TOTAL) BY MOUTH DAILY 135 tablet 1   amiodarone (PACERONE) 200 MG tablet Take 1 tablet (200 mg total) by mouth 2 (two) times daily. 90 tablet 1   apixaban (ELIQUIS) 2.5 MG  TABS tablet TAKE 1 TABLET BY MOUTH TWICE A DAY 60 tablet 5   cholecalciferol (VITAMIN D) 25 MCG (1000 UNIT) tablet Take 2 tablets (2,000 Units total) by mouth daily.     LORazepam (ATIVAN) 0.5 MG tablet TAKE 1 TABLET BY MOUTH TWICE A DAY AS NEEDED FOR ANXIETY 60 tablet 0   mirtazapine (REMERON) 30 MG tablet Take 1 tablet (30 mg total) by mouth at bedtime. 90 tablet 3   Multiple Vitamin (MULTIVITAMIN) capsule Take 1 capsule by mouth daily.     Multiple Vitamins-Minerals (PRESERVISION AREDS 2+MULTI VIT PO) Take 1 capsule by mouth in the morning and at bedtime.     gabapentin (NEURONTIN) 100 MG capsule Take 1 capsule (100 mg total) by mouth 3 (three) times daily.     metoprolol tartrate (LOPRESSOR) 25 MG tablet Take 1 tablet (25 mg total) by mouth 3 (three) times daily. 90 tablet 0   No current facility-administered medications for this visit.    No Known Allergies  Review of Systems negative except from HPI and PMH  Physical Exam: BP 126/88   Pulse (!) 114   Ht 5\' 7"  (1.702 m)   Wt 109 lb 6.4 oz (49.6 kg)   SpO2 99%   BMI 17.13 kg/m  Well developed and nourished in no acute distress HENT normal Neck supple with JVP-  flat  Clear Irregularly Irregular rate and rhythm rapid ventricular response Regular rate and rhythm, no murmurs or gallops Abd-soft with active BS No Clubbing cyanosis edema Skin-warm and dry A & Oriented  Grossly normal sensory and motor function  ECG atrial fibrillation at 111 Interval-/09/36  Assessment and  Plan  Atrial fibrillation-persistent with a rapid rate  High Risk Medication Surveillance-amiodarone  Dyspnea on exertion  Anemia -- elevated MCV, iron labs normal  Renal insufficiency grade 3  Hypertension   Orthostatic hypotension  Headache-severe  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  Will have to reach out to Dr. Merita Norton, but I am not sanguine that a repeat procedure will be particularly helpful and have  suggested that we think about AV junction ablation and pacing.  A more urgent nature is for severe headache, and this may inform atrial fibrillation therapies.  We need to exclude intracranial bleeding.  In the event that this is positive, anticoagulation will need to be stopped in the event that it is negative, cardioversion can be considered as we try to make a final recommendation is to neck step for her atrial fibrillation.

## 2022-10-27 NOTE — Telephone Encounter (Signed)
I sent in prednisone for her to start on and augmentin to treat a possible sinus infection.  It is likely that the sinus congestion is causing her symptoms.  Please confirm how long her symptoms have been going on for.  Thanks.

## 2022-10-27 NOTE — Telephone Encounter (Signed)
Patient states she is having a severe headache in her ears and behind her ears and a runny nose. Patient's pain level was a 8 out of 10 but she took Tylenol now it is a 7. Patient states she would like a prescription for this headache. Patient denies any other symptoms. Patient would like you to know that Dr. Graciela Husbands is talking about an ablation and pacemaker. Please advise

## 2022-10-27 NOTE — Telephone Encounter (Signed)
Patient states she has had these symptoms for 4 days. Patient understands and is agreeable to the Prednisone and Augmentin.

## 2022-10-27 NOTE — Patient Instructions (Signed)
Medication Instructions:  Your physician has recommended you make the following change in your medication:   Increase Metoprolol 25mg  to three times daily  *If you need a refill on your cardiac medications before your next appointment, please call your pharmacy*   Lab Work: None ordered.  If you have labs (blood work) drawn today and your tests are completely normal, you will receive your results only by: MyChart Message (if you have MyChart) OR A paper copy in the mail If you have any lab test that is abnormal or we need to change your treatment, we will call you to review the results.   Testing/Procedures:  Stat Head CT without contrast Non-Cardiac CT scanning, (CAT scanning), is a noninvasive, special x-ray that produces cross-sectional images of the body using x-rays and a computer. CT scans help physicians diagnose and treat medical conditions. For some CT exams, a contrast material is used to enhance visibility in the area of the body being studied. CT scans provide greater clarity and reveal more details than regular x-ray exams.    Follow-Up: At [facility], you and your health needs are our priority.  As part of our continuing mission to provide you with exceptional heart care, we have created designated Provider Care Teams.  These Care Teams include your primary Cardiologist (physician) and Advanced Practice Providers (APPs -  Physician Assistants and Nurse Practitioners) who all work together to provide you with the care you need, when you need it.  We recommend signing up for the patient portal called "MyChart".  Sign up information is provided on this After Visit Summary.  MyChart is used to connect with patients for Virtual Visits (Telemedicine).  Patients are able to view lab/test results, encounter notes, upcoming appointments, etc.  Non-urgent messages can be sent to your provider as well.   To learn more about what you can do with MyChart, go to  [email or web address].    Your next appointment:   To be determined

## 2022-10-28 ENCOUNTER — Inpatient Hospital Stay
Admission: EM | Admit: 2022-10-28 | Discharge: 2022-11-18 | DRG: 286 | Disposition: E | Payer: Medicare Other | Attending: Internal Medicine | Admitting: Internal Medicine

## 2022-10-28 ENCOUNTER — Other Ambulatory Visit: Payer: Self-pay

## 2022-10-28 ENCOUNTER — Inpatient Hospital Stay: Payer: Medicare Other

## 2022-10-28 ENCOUNTER — Inpatient Hospital Stay (HOSPITAL_COMMUNITY)
Admit: 2022-10-28 | Discharge: 2022-10-28 | Disposition: A | Discharge status: Home / Self Care | Payer: Medicare Other | Attending: Medical | Admitting: Medical

## 2022-10-28 ENCOUNTER — Encounter: Admission: EM | Disposition: E | Payer: Self-pay | Source: Home / Self Care | Attending: Internal Medicine

## 2022-10-28 DIAGNOSIS — Z8249 Family history of ischemic heart disease and other diseases of the circulatory system: Secondary | ICD-10-CM

## 2022-10-28 DIAGNOSIS — E785 Hyperlipidemia, unspecified: Secondary | ICD-10-CM | POA: Diagnosis present

## 2022-10-28 DIAGNOSIS — Z7189 Other specified counseling: Secondary | ICD-10-CM | POA: Diagnosis not present

## 2022-10-28 DIAGNOSIS — E871 Hypo-osmolality and hyponatremia: Secondary | ICD-10-CM | POA: Diagnosis present

## 2022-10-28 DIAGNOSIS — E872 Acidosis, unspecified: Secondary | ICD-10-CM | POA: Diagnosis present

## 2022-10-28 DIAGNOSIS — K219 Gastro-esophageal reflux disease without esophagitis: Secondary | ICD-10-CM | POA: Diagnosis present

## 2022-10-28 DIAGNOSIS — I2510 Atherosclerotic heart disease of native coronary artery without angina pectoris: Secondary | ICD-10-CM | POA: Diagnosis present

## 2022-10-28 DIAGNOSIS — R7401 Elevation of levels of liver transaminase levels: Secondary | ICD-10-CM | POA: Diagnosis not present

## 2022-10-28 DIAGNOSIS — Z853 Personal history of malignant neoplasm of breast: Secondary | ICD-10-CM

## 2022-10-28 DIAGNOSIS — I472 Ventricular tachycardia, unspecified: Secondary | ICD-10-CM

## 2022-10-28 DIAGNOSIS — I4811 Longstanding persistent atrial fibrillation: Secondary | ICD-10-CM | POA: Diagnosis present

## 2022-10-28 DIAGNOSIS — Z96649 Presence of unspecified artificial hip joint: Secondary | ICD-10-CM | POA: Diagnosis present

## 2022-10-28 DIAGNOSIS — B974 Respiratory syncytial virus as the cause of diseases classified elsewhere: Secondary | ICD-10-CM | POA: Diagnosis present

## 2022-10-28 DIAGNOSIS — R748 Abnormal levels of other serum enzymes: Secondary | ICD-10-CM | POA: Diagnosis not present

## 2022-10-28 DIAGNOSIS — J690 Pneumonitis due to inhalation of food and vomit: Secondary | ICD-10-CM | POA: Diagnosis present

## 2022-10-28 DIAGNOSIS — L409 Psoriasis, unspecified: Secondary | ICD-10-CM | POA: Diagnosis present

## 2022-10-28 DIAGNOSIS — Z7901 Long term (current) use of anticoagulants: Secondary | ICD-10-CM

## 2022-10-28 DIAGNOSIS — Z452 Encounter for adjustment and management of vascular access device: Secondary | ICD-10-CM | POA: Diagnosis not present

## 2022-10-28 DIAGNOSIS — Z4682 Encounter for fitting and adjustment of non-vascular catheter: Secondary | ICD-10-CM | POA: Diagnosis not present

## 2022-10-28 DIAGNOSIS — I5043 Acute on chronic combined systolic (congestive) and diastolic (congestive) heart failure: Secondary | ICD-10-CM | POA: Diagnosis present

## 2022-10-28 DIAGNOSIS — I428 Other cardiomyopathies: Secondary | ICD-10-CM | POA: Diagnosis present

## 2022-10-28 DIAGNOSIS — I4729 Other ventricular tachycardia: Secondary | ICD-10-CM | POA: Diagnosis present

## 2022-10-28 DIAGNOSIS — I959 Hypotension, unspecified: Secondary | ICD-10-CM | POA: Diagnosis not present

## 2022-10-28 DIAGNOSIS — R739 Hyperglycemia, unspecified: Secondary | ICD-10-CM | POA: Diagnosis present

## 2022-10-28 DIAGNOSIS — R001 Bradycardia, unspecified: Secondary | ICD-10-CM | POA: Diagnosis present

## 2022-10-28 DIAGNOSIS — I110 Hypertensive heart disease with heart failure: Secondary | ICD-10-CM | POA: Diagnosis present

## 2022-10-28 DIAGNOSIS — M8580 Other specified disorders of bone density and structure, unspecified site: Secondary | ICD-10-CM | POA: Diagnosis present

## 2022-10-28 DIAGNOSIS — J9601 Acute respiratory failure with hypoxia: Secondary | ICD-10-CM | POA: Diagnosis present

## 2022-10-28 DIAGNOSIS — I469 Cardiac arrest, cause unspecified: Secondary | ICD-10-CM | POA: Diagnosis present

## 2022-10-28 DIAGNOSIS — Z66 Do not resuscitate: Secondary | ICD-10-CM | POA: Diagnosis present

## 2022-10-28 DIAGNOSIS — I5021 Acute systolic (congestive) heart failure: Secondary | ICD-10-CM | POA: Diagnosis not present

## 2022-10-28 DIAGNOSIS — Z90710 Acquired absence of both cervix and uterus: Secondary | ICD-10-CM

## 2022-10-28 DIAGNOSIS — Z923 Personal history of irradiation: Secondary | ICD-10-CM

## 2022-10-28 DIAGNOSIS — Z515 Encounter for palliative care: Secondary | ICD-10-CM | POA: Diagnosis not present

## 2022-10-28 DIAGNOSIS — Z87891 Personal history of nicotine dependence: Secondary | ICD-10-CM

## 2022-10-28 DIAGNOSIS — R188 Other ascites: Secondary | ICD-10-CM | POA: Diagnosis not present

## 2022-10-28 DIAGNOSIS — R569 Unspecified convulsions: Secondary | ICD-10-CM | POA: Diagnosis present

## 2022-10-28 DIAGNOSIS — Z79899 Other long term (current) drug therapy: Secondary | ICD-10-CM

## 2022-10-28 DIAGNOSIS — I499 Cardiac arrhythmia, unspecified: Secondary | ICD-10-CM | POA: Diagnosis not present

## 2022-10-28 DIAGNOSIS — J90 Pleural effusion, not elsewhere classified: Secondary | ICD-10-CM | POA: Diagnosis not present

## 2022-10-28 DIAGNOSIS — E876 Hypokalemia: Secondary | ICD-10-CM | POA: Diagnosis present

## 2022-10-28 DIAGNOSIS — R55 Syncope and collapse: Secondary | ICD-10-CM | POA: Diagnosis not present

## 2022-10-28 DIAGNOSIS — J9691 Respiratory failure, unspecified with hypoxia: Secondary | ICD-10-CM | POA: Diagnosis not present

## 2022-10-28 DIAGNOSIS — Z1152 Encounter for screening for COVID-19: Secondary | ICD-10-CM

## 2022-10-28 DIAGNOSIS — Z7952 Long term (current) use of systemic steroids: Secondary | ICD-10-CM

## 2022-10-28 DIAGNOSIS — R000 Tachycardia, unspecified: Secondary | ICD-10-CM | POA: Diagnosis not present

## 2022-10-28 DIAGNOSIS — R918 Other nonspecific abnormal finding of lung field: Secondary | ICD-10-CM | POA: Diagnosis not present

## 2022-10-28 DIAGNOSIS — R0689 Other abnormalities of breathing: Secondary | ICD-10-CM | POA: Diagnosis not present

## 2022-10-28 DIAGNOSIS — I4819 Other persistent atrial fibrillation: Secondary | ICD-10-CM | POA: Diagnosis not present

## 2022-10-28 DIAGNOSIS — I493 Ventricular premature depolarization: Secondary | ICD-10-CM | POA: Diagnosis present

## 2022-10-28 HISTORY — PX: TEMPORARY PACEMAKER: CATH118268

## 2022-10-28 HISTORY — PX: LEFT HEART CATH AND CORONARY ANGIOGRAPHY: CATH118249

## 2022-10-28 HISTORY — PX: CORONARY/GRAFT ACUTE MI REVASCULARIZATION: CATH118305

## 2022-10-28 LAB — URINE DRUG SCREEN, QUALITATIVE (ARMC ONLY)
Amphetamines, Ur Screen: NOT DETECTED
Barbiturates, Ur Screen: NOT DETECTED
Benzodiazepine, Ur Scrn: POSITIVE — AB
Cannabinoid 50 Ng, Ur ~~LOC~~: NOT DETECTED
Cocaine Metabolite,Ur ~~LOC~~: NOT DETECTED
MDMA (Ecstasy)Ur Screen: NOT DETECTED
Methadone Scn, Ur: NOT DETECTED
Opiate, Ur Screen: NOT DETECTED
Phencyclidine (PCP) Ur S: NOT DETECTED
Tricyclic, Ur Screen: NOT DETECTED

## 2022-10-28 LAB — URINALYSIS, COMPLETE (UACMP) WITH MICROSCOPIC
Bacteria, UA: NONE SEEN
Bilirubin Urine: NEGATIVE
Glucose, UA: 50 mg/dL — AB
Hgb urine dipstick: NEGATIVE
Ketones, ur: 5 mg/dL — AB
Leukocytes,Ua: NEGATIVE
Nitrite: NEGATIVE
Protein, ur: 100 mg/dL — AB
Specific Gravity, Urine: >1.046 — ABNORMAL HIGH (ref 1.005–1.030)
pH: 5 (ref 5.0–8.0)

## 2022-10-28 LAB — BLOOD GAS, ARTERIAL
Acid-base deficit: 6.1 mmol/L — ABNORMAL HIGH (ref 0.0–2.0)
Bicarbonate: 18.9 mmol/L — ABNORMAL LOW (ref 20.0–28.0)
FIO2: 60 %
MECHVT: 400 mL
Mechanical Rate: 20
O2 Saturation: 99 %
PEEP: 8 cmH2O
Patient temperature: 37
pCO2 arterial: 35 mmHg (ref 32–48)
pH, Arterial: 7.34 — ABNORMAL LOW (ref 7.35–7.45)
pO2, Arterial: 128 mmHg — ABNORMAL HIGH (ref 83–108)

## 2022-10-28 LAB — OSMOLALITY, URINE: Osmolality, Ur: 567 mOsm/kg (ref 300–900)

## 2022-10-28 LAB — BASIC METABOLIC PANEL
Anion gap: 11 (ref 5–15)
BUN: 19 mg/dL (ref 8–23)
CO2: 15 mmol/L — ABNORMAL LOW (ref 22–32)
Calcium: 6.1 mg/dL — CL (ref 8.9–10.3)
Chloride: 107 mmol/L (ref 98–111)
Creatinine, Ser: 0.87 mg/dL (ref 0.44–1.00)
GFR, Estimated: >60 mL/min (ref >60)
Glucose, Bld: 466 mg/dL — ABNORMAL HIGH (ref 70–99)
Potassium: 3.3 mmol/L — ABNORMAL LOW (ref 3.5–5.1)
Sodium: 133 mmol/L — ABNORMAL LOW (ref 135–145)

## 2022-10-28 LAB — CBC
HCT: 34.9 % — ABNORMAL LOW (ref 36.0–46.0)
Hemoglobin: 11.2 g/dL — ABNORMAL LOW (ref 12.0–15.0)
MCH: 32.9 pg (ref 26.0–34.0)
MCHC: 32.1 g/dL (ref 30.0–36.0)
MCV: 102.6 fL — ABNORMAL HIGH (ref 80.0–100.0)
Platelets: 314 10*3/uL (ref 150–400)
RBC: 3.4 MIL/uL — ABNORMAL LOW (ref 3.87–5.11)
RDW: 16.1 % — ABNORMAL HIGH (ref 11.5–15.5)
WBC: 5.4 10*3/uL (ref 4.0–10.5)
nRBC: 0 % (ref 0.0–0.2)

## 2022-10-28 LAB — HEPATIC FUNCTION PANEL
ALT: 65 U/L — ABNORMAL HIGH (ref 0–44)
AST: 122 U/L — ABNORMAL HIGH (ref 15–41)
Albumin: 3 g/dL — ABNORMAL LOW (ref 3.5–5.0)
Alkaline Phosphatase: 314 U/L — ABNORMAL HIGH (ref 38–126)
Bilirubin, Direct: 0.3 mg/dL — ABNORMAL HIGH (ref 0.0–0.2)
Indirect Bilirubin: 0.7 mg/dL (ref 0.3–0.9)
Total Bilirubin: 1 mg/dL (ref 0.3–1.2)
Total Protein: 6.4 g/dL — ABNORMAL LOW (ref 6.5–8.1)

## 2022-10-28 LAB — MAGNESIUM: Magnesium: 1.5 mg/dL — ABNORMAL LOW (ref 1.7–2.4)

## 2022-10-28 LAB — ETHANOL: Alcohol, Ethyl (B): <10 mg/dL (ref <10)

## 2022-10-28 LAB — BRAIN NATRIURETIC PEPTIDE: B Natriuretic Peptide: 1101.6 pg/mL — ABNORMAL HIGH (ref 0.0–100.0)

## 2022-10-28 LAB — GLUCOSE, CAPILLARY
Glucose-Capillary: 232 mg/dL — ABNORMAL HIGH (ref 70–99)
Glucose-Capillary: 145 mg/dL — ABNORMAL HIGH (ref 70–99)
Glucose-Capillary: 115 mg/dL — ABNORMAL HIGH (ref 70–99)

## 2022-10-28 LAB — CBG MONITORING, ED: Glucose-Capillary: 285 mg/dL — ABNORMAL HIGH (ref 70–99)

## 2022-10-28 LAB — TSH: TSH: 2.407 u[IU]/mL (ref 0.350–4.500)

## 2022-10-28 LAB — TROPONIN I (HIGH SENSITIVITY)
Troponin I (High Sensitivity): 32 ng/L — ABNORMAL HIGH (ref <18)
Troponin I (High Sensitivity): 35 ng/L — ABNORMAL HIGH (ref <18)

## 2022-10-28 LAB — SARS CORONAVIRUS 2 BY RT PCR: SARS Coronavirus 2 by RT PCR: NEGATIVE

## 2022-10-28 LAB — PHOSPHORUS: Phosphorus: 2.8 mg/dL (ref 2.5–4.6)

## 2022-10-28 LAB — OSMOLALITY: Osmolality: 301 mOsm/kg — ABNORMAL HIGH (ref 275–295)

## 2022-10-28 LAB — BETA-HYDROXYBUTYRIC ACID: Beta-Hydroxybutyric Acid: 0.98 mmol/L — ABNORMAL HIGH (ref 0.05–0.27)

## 2022-10-28 LAB — MRSA NEXT GEN BY PCR, NASAL: MRSA by PCR Next Gen: NOT DETECTED

## 2022-10-28 SURGERY — CORONARY/GRAFT ACUTE MI REVASCULARIZATION
Anesthesia: Moderate Sedation

## 2022-10-28 MED ORDER — MIDAZOLAM HCL 2 MG/2ML IJ SOLN
1.0000 mg | INTRAMUSCULAR | Status: DC | PRN
  Administered 2022-10-28: 2 mg via INTRAVENOUS
  Filled 2022-10-28 (×2): qty 2

## 2022-10-28 MED ORDER — SODIUM CHLORIDE 0.9 % IV SOLN: Freq: Once | INTRAVENOUS | Status: AC

## 2022-10-28 MED ORDER — SUCCINYLCHOLINE CHLORIDE 20 MG/ML IJ SOLN
INTRAMUSCULAR | Status: AC | PRN
  Administered 2022-10-28: 100 mg via INTRAVENOUS

## 2022-10-28 MED ORDER — POLYETHYLENE GLYCOL 3350 17 G PO PACK
17.0000 g | PACK | Freq: Every day | ORAL | Status: DC
  Administered 2022-10-29: 17 g
  Filled 2022-10-28: qty 1

## 2022-10-28 MED ORDER — HYDRALAZINE HCL 20 MG/ML IJ SOLN: 10.0000 mg | INTRAMUSCULAR | Status: DC | PRN

## 2022-10-28 MED ORDER — AMIODARONE HCL IN DEXTROSE 360-4.14 MG/200ML-% IV SOLN: 30.0000 mg/h | INTRAVENOUS | Status: DC

## 2022-10-28 MED ORDER — ETOMIDATE 2 MG/ML IV SOLN
INTRAVENOUS | Status: AC | PRN
  Administered 2022-10-28: 20 mg via INTRAVENOUS

## 2022-10-28 MED ORDER — HEPARIN (PORCINE) IN NACL 1000-0.9 UT/500ML-% IV SOLN
INTRAVENOUS | Status: DC | PRN
  Administered 2022-10-28 (×2): 500 mL

## 2022-10-28 MED ORDER — MIDAZOLAM HCL 2 MG/2ML IJ SOLN
INTRAMUSCULAR | Status: AC
  Filled 2022-10-28: qty 2

## 2022-10-28 MED ORDER — PROPOFOL 10 MG/ML IV BOLUS
20.0000 mg | Freq: Once | INTRAVENOUS | Status: AC
  Administered 2022-10-28: 20 mg via INTRAVENOUS

## 2022-10-28 MED ORDER — AMIODARONE HCL IN DEXTROSE 360-4.14 MG/200ML-% IV SOLN
60.0000 mg/h | INTRAVENOUS | Status: DC
  Administered 2022-10-28: 60 mg/h via INTRAVENOUS
  Filled 2022-10-28: qty 200

## 2022-10-28 MED ORDER — LABETALOL HCL 5 MG/ML IV SOLN: 10.0000 mg | INTRAVENOUS | Status: DC | PRN

## 2022-10-28 MED ORDER — SODIUM CHLORIDE 0.9% FLUSH: 3.0000 mL | INTRAVENOUS | Status: DC | PRN

## 2022-10-28 MED ORDER — PROPOFOL 1000 MG/100ML IV EMUL
5.0000 ug/kg/min | INTRAVENOUS | Status: DC
  Administered 2022-10-28: 40 ug/kg/min via INTRAVENOUS
  Filled 2022-10-28: qty 100

## 2022-10-28 MED ORDER — POTASSIUM CHLORIDE 20 MEQ PO PACK
40.0000 meq | PACK | Freq: Once | ORAL | Status: AC
  Administered 2022-10-29: 40 meq
  Filled 2022-10-28: qty 2

## 2022-10-28 MED ORDER — INSULIN ASPART 100 UNIT/ML IJ SOLN
0.0000 [IU] | INTRAMUSCULAR | Status: DC
  Administered 2022-10-28: 3 [IU] via SUBCUTANEOUS
  Filled 2022-10-28: qty 1

## 2022-10-28 MED ORDER — MIDAZOLAM HCL 2 MG/2ML IJ SOLN
2.0000 mg | Freq: Once | INTRAMUSCULAR | Status: AC
  Administered 2022-10-28: 2 mg via INTRAVENOUS

## 2022-10-28 MED ORDER — NOREPINEPHRINE 4 MG/250ML-% IV SOLN
INTRAVENOUS | Status: AC
  Filled 2022-10-28: qty 250

## 2022-10-28 MED ORDER — INSULIN ASPART 100 UNIT/ML IJ SOLN: 0.0000 [IU] | INTRAMUSCULAR | Status: DC

## 2022-10-28 MED ORDER — ETOMIDATE 2 MG/ML IV SOLN
INTRAVENOUS | Status: AC
  Filled 2022-10-28: qty 10

## 2022-10-28 MED ORDER — POTASSIUM CHLORIDE 20 MEQ PO PACK
20.0000 meq | PACK | Freq: Once | ORAL | Status: AC
  Administered 2022-10-28: 20 meq
  Filled 2022-10-28: qty 1

## 2022-10-28 MED ORDER — SUCCINYLCHOLINE CHLORIDE 200 MG/10ML IV SOSY
PREFILLED_SYRINGE | INTRAVENOUS | Status: AC
  Filled 2022-10-28: qty 10

## 2022-10-28 MED ORDER — ONDANSETRON HCL 4 MG/2ML IJ SOLN: 4.0000 mg | Freq: Four times a day (QID) | INTRAMUSCULAR | Status: DC | PRN

## 2022-10-28 MED ORDER — MIDAZOLAM HCL 2 MG/2ML IJ SOLN
INTRAMUSCULAR | Status: DC | PRN
  Administered 2022-10-28 (×2): 2 mg via INTRAVENOUS

## 2022-10-28 MED ORDER — SODIUM CHLORIDE 0.9 % IV SOLN: 250.0000 mL | INTRAVENOUS | Status: DC | PRN

## 2022-10-28 MED ORDER — CALCIUM GLUCONATE-NACL 2-0.675 GM/100ML-% IV SOLN
2.0000 g | Freq: Once | INTRAVENOUS | Status: AC
  Administered 2022-10-28: 2000 mg via INTRAVENOUS
  Filled 2022-10-28: qty 100

## 2022-10-28 MED ORDER — ENOXAPARIN SODIUM 40 MG/0.4ML IJ SOSY
40.0000 mg | PREFILLED_SYRINGE | INTRAMUSCULAR | Status: DC
  Administered 2022-10-28: 40 mg via SUBCUTANEOUS
  Filled 2022-10-28: qty 0.4

## 2022-10-28 MED ORDER — IOHEXOL 300 MG/ML  SOLN
INTRAMUSCULAR | Status: DC | PRN
  Administered 2022-10-28: 96 mL

## 2022-10-28 MED ORDER — ACETAMINOPHEN 325 MG PO TABS: 650.0000 mg | ORAL_TABLET | ORAL | Status: DC | PRN

## 2022-10-28 MED ORDER — PANTOPRAZOLE SODIUM 40 MG IV SOLR
40.0000 mg | INTRAVENOUS | Status: DC
  Administered 2022-10-28: 40 mg via INTRAVENOUS
  Filled 2022-10-28: qty 10

## 2022-10-28 MED ORDER — PROPOFOL 1000 MG/100ML IV EMUL
INTRAVENOUS | Status: AC
  Administered 2022-10-28: 20 ug/kg/min
  Filled 2022-10-28: qty 100

## 2022-10-28 MED ORDER — MAGNESIUM SULFATE 4 GM/100ML IV SOLN
4.0000 g | Freq: Once | INTRAVENOUS | Status: AC
  Administered 2022-10-28: 4 g via INTRAVENOUS
  Filled 2022-10-28: qty 100

## 2022-10-28 MED ORDER — FENTANYL BOLUS VIA INFUSION: 25.0000 ug | INTRAVENOUS | Status: DC | PRN

## 2022-10-28 MED ORDER — SODIUM CHLORIDE 0.9 % WEIGHT BASED INFUSION
1.0000 mL/kg/h | INTRAVENOUS | Status: DC
  Administered 2022-10-28: 1 mL/kg/h via INTRAVENOUS

## 2022-10-28 MED ORDER — SODIUM CHLORIDE 0.9% FLUSH
3.0000 mL | Freq: Two times a day (BID) | INTRAVENOUS | Status: DC
  Administered 2022-10-29: 3 mL via INTRAVENOUS

## 2022-10-28 MED ORDER — FENTANYL 2500MCG IN NS 250ML (10MCG/ML) PREMIX INFUSION
25.0000 ug/h | INTRAVENOUS | Status: DC
  Administered 2022-10-28: 25 ug/h via INTRAVENOUS
  Filled 2022-10-28: qty 250

## 2022-10-28 MED ORDER — DOCUSATE SODIUM 50 MG/5ML PO LIQD
100.0000 mg | Freq: Two times a day (BID) | ORAL | Status: DC
  Administered 2022-10-29: 100 mg
  Filled 2022-10-28: qty 10

## 2022-10-28 MED ORDER — NOREPINEPHRINE 4 MG/250ML-% IV SOLN
0.0000 ug/min | INTRAVENOUS | Status: DC
  Administered 2022-10-28: 5 ug/min via INTRAVENOUS

## 2022-10-28 MED ORDER — SODIUM BICARBONATE 8.4 % IV SOLN
50.0000 meq | Freq: Once | INTRAVENOUS | Status: AC
  Administered 2022-10-28: 50 meq via INTRAVENOUS
  Filled 2022-10-28: qty 50

## 2022-10-28 SURGICAL SUPPLY — 16 items
BIOPATCH RED 1 DISK 7.0 (GAUZE/BANDAGES/DRESSINGS) IMPLANT
CABLE ADAPT PACING TEMP 12FT (ADAPTER) IMPLANT
CATH INFINITI 5FR MULTPACK ANG (CATHETERS) IMPLANT
CATH S G BIP PACING (CATHETERS) IMPLANT
DEVICE CLOSURE MYNXGRIP 5F (Vascular Products) IMPLANT
NDL PERC 18GX7CM (NEEDLE) IMPLANT
NEEDLE PERC 18GX7CM (NEEDLE) ×1 IMPLANT
PACK CARDIAC CATH (CUSTOM PROCEDURE TRAY) ×1 IMPLANT
PROTECTION STATION PRESSURIZED (MISCELLANEOUS) ×1
SET ATX-X65L (MISCELLANEOUS) IMPLANT
SHEATH AVANTI 5FR X 11CM (SHEATH) IMPLANT
SHEATH AVANTI 6FR X 11CM (SHEATH) IMPLANT
SLEEVE REPOSITIONING LENGTH 30 (MISCELLANEOUS) IMPLANT
STATION PROTECTION PRESSURIZED (MISCELLANEOUS) IMPLANT
SUT SILK 0 FSL (SUTURE) IMPLANT
WIRE GUIDERIGHT .035X150 (WIRE) IMPLANT

## 2022-10-28 NOTE — ED Triage Notes (Signed)
Pt to ED [facility] from home Called for seizure like activity 5 instances of seizure like activity today lasting 10-30 sec, 1 tonic clonic seizure (L arm and L leg only) witnessed by EMS (5th one) 2.5mg  versed given IV. Also amiodarone and D5W was started by EMS to treat VT run (150mg  amio given)  EMS VS: CBG 234, HR variable, BP was stable (SBP 138), 92% on RA then 96% on 2L 12 lead was NSR with run of VT during seizure  Pt denies hx seizures. Hx a fib, took PO metoprolol prior to EMS arrival  88% on RA, placed on 2L oxygen. Alert, oriented. No postictal signs noted. No injury noted. Denies urinary incontinence.  Pt complains of HA and neck, shoulder pain  EDP at bedside. Monitor reading sinus brady with PVCs.

## 2022-10-28 NOTE — Code Documentation (Signed)
Sinus brady with PVCs.

## 2022-10-28 NOTE — Progress Notes (Signed)
RT called to room for Code/Intubation. Pt intubated by ED MD and Student NP without difficulty. Positive color change on CO2 detector, breath sounds all fields, direct visualization of ETT placement. Pt placed on Ventilator

## 2022-10-28 NOTE — Progress Notes (Signed)
  Chaplain On-Call responded to Code STEMI notification at 1631 hours.  The patient was examined in room ED-17, and then was transported to the Cath Lab for procedures.  Chaplain met three family members in the Cath Lab Waiting room: Daughter Si Gaul Son-in-Law Dimple Casey Daughter-in-Law Taneeka Hardeman  Chaplain offered spiritual and emotional support as they described the events of today leading to the patient's emergency situation. They stated their understanding that a temporary pacemaker will be placed, and cardiac catheterization performed.  Chaplain provided prayer and assurance of further support if requested.  Chaplain Evelena Peat M.Div., [facility]

## 2022-10-28 NOTE — Code Documentation (Signed)
Placed on 15L.

## 2022-10-28 NOTE — ED Notes (Signed)
RT aware that ABG was ordered.

## 2022-10-28 NOTE — ED Notes (Signed)
Pt states would want chest compressions and intubation "only if I would have a good quality of life" and otherwise not. Pt is alert, oriented. Zoll pads on. EDP has been back to check on pt.

## 2022-10-28 NOTE — Code Documentation (Signed)
Cardioversion performed. 

## 2022-10-28 NOTE — Code Documentation (Signed)
In and out of VT. SB now. Preparing to intubate.

## 2022-10-28 NOTE — Code Documentation (Signed)
Succ and etomidate verbally ordered. Pt will be intubated. Pt has pulse but is gasping with eyes open and not responding verbally.

## 2022-10-28 NOTE — ED Notes (Signed)
EDP at bedside. Pt is conscious.

## 2022-10-28 NOTE — Progress Notes (Signed)
Applied heated blankets

## 2022-10-28 NOTE — Code Documentation (Signed)
Pt getting ventilations with BVM through ET tube.

## 2022-10-28 NOTE — ED Notes (Signed)
RT aware that

## 2022-10-28 NOTE — ED Notes (Signed)
Pt went into VT again. EKG captured. EDP called to bedside. Pt lost pulses. CPR just started.

## 2022-10-28 NOTE — Progress Notes (Signed)
Pt transported to Cath Lab with RN, NT, MD. This RT remained with pt throughout procedure then transported to CT, then transported to ICU1 without incident

## 2022-10-28 NOTE — ED Notes (Signed)
Placed on zoll pads. HR is 46 with frequent PVCs. Obtaining second IV.

## 2022-10-28 NOTE — Progress Notes (Signed)
PHARMACY CONSULT NOTE - FOLLOW UP  Pharmacy Consult for Electrolyte Monitoring and Replacement   Recent Labs: Potassium (mmol/L)  Date Value  11/08/2022 3.3 (L)  05/01/2013 4.2   Magnesium (mg/dL)  Date Value  74/82/7078 2.1   Calcium (mg/dL)  Date Value  67/54/4920 6.1 (LL)   Calcium, Total (mg/dL)  Date Value  04/25/1218 9.2   Albumin (g/dL)  Date Value  75/88/3254 3.5  07/31/2021 4.1   Phosphorus (mg/dL)  Date Value  98/26/4158 2.5   Sodium (mmol/L)  Date Value  11/07/2022 133 (L)  08/11/2021 145 (H)  05/01/2013 139     Assessment: Potassium: 3.3, Calcium 6.1 (last albumin 3.5 on 1/26)  Goal of Therapy:  Electrolytes WNL  Plan:  Kcl 20 mEq per tube x 1 Calcium gluconate 2 gm IV x 1 Recheck electrolytes with 4/11 AM labs  Manfred Shirts ,PharmD Clinical Pharmacist 11/09/2022 6:35 PM

## 2022-10-28 NOTE — ED Notes (Signed)
Pt will go to cath lab. RT on way. Cath lab is ready.

## 2022-10-28 NOTE — Code Documentation (Signed)
EDP performed synchronized cardioversion twice before CPR was started. Pt has pulse again now and is conscious. CPR lasted about 30 seconds.

## 2022-10-28 NOTE — Code Documentation (Signed)
Cardiologist at bedside.  

## 2022-10-28 NOTE — Progress Notes (Signed)
Pt arrival around 1830 and placed on monitor. Pt connected to temporary pacemaker. Leanord Asal, NP given update of need to advance OGT - OGT advanced by 10-15cms. Right groin site CDI without hematoma present. BLE cool, with doppler pulses present. Updates given to pt's daughter at bedside, and pt's daughter via phone. Report given to [facility] [place].

## 2022-10-28 NOTE — H&P (Signed)
NAME:  [name], MRN:  [id number], DOB:  March 28, 1941, LOS: 0 ADMISSION DATE:  11/14/2022, CONSULTATION DATE:  10/24/2022 REFERRING MD:  Dr. Okey Dupre, CHIEF COMPLAINT:  Seizure-like activity and syncope    History of Present Illness:  [name] is a 82 y.o. 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  EMS noted her to be in V. Tach of which she was given Amiodarone.  This was followed by brief episode of seizure like activity (tremor of all extremities) and unconsciousness.  She then awoke and was not postictal.  Upon arrival to the ED she was bradycardic (A. Fib), awake and alert, and normotensive.  She denied chest pain, but did report some generalized body aches/soreness.  ED Course: Initial Vital Signs: Temperature , Pulse 51, BP 123/65, SpO2 99% Significant Labs: Sodium 133, potassium 3.3, bicarb 15, glucose 466, calcium 6.1, anion gap 11, high-sensitivity troponin 32, WBC 5.4, hemoglobin 11.2 Imaging Chest X-ray>>IMPRESSION: 1. Endotracheal tube tip about 2 cm superior to carina. 2. Esophageal tube tip at the level of diaphragm, side-port at the level of distal esophagus. Further advancement is recommended for more optimal positioning. 3. Interval left greater than right interstitial and ground-glass opacity, suspect for edema.  While in the ED she had several runs of V. Tach, at one point losing consciousness.  She was placed Amiodarone infusion, and code STEMI was called for evaluation of temporary pacer.  Later she did briefly lost pulses for about 20 seconds of which received CPR and defibrillation x2 with return of pulses. Decision was made to intubate, and to take her emergently  to the Cath Lab for emergent transvenous pacemaker placement.  She returns to ICU post Cath and remains intubated.  PCCM consulted for further workup and treatment, and management of vent.  Please see "Significant Hospital Events" section below for full detailed hospital course.   Pertinent  Medical History   Past Medical History:  Diagnosis Date   (HFpEF) heart failure with preserved ejection fraction    a. 08/2017 Echo: EF 55-60%, no rwma, mild MR, mildly dil LA, nl RV fxn; b. 03/2020 Echo: EF 60-65%, no rwma, Gr2 DD. RVSP 44.66mmHg. Mod dil LA. Mild MR; c. 09/2020 Echo: EF 50-55%, no rwma, mild LVH, mod red RV fxn, mildly dily RA.   Acute CHF (congestive heart failure) 09/18/2020   Alcohol abuse 04/05/2020   Breast cancer 2001   left breast   Cancer 2001   left breast ca   Carotid arterial disease    a. 10/2004 s/p L CEA; b. 12/2015 Carotid U/S: RICA 1-39%; b. LICA patent CEA site.   Closed right hip fracture 09/20/2019   GERD (gastroesophageal reflux disease)    Hyperlipidemia    Hypertension    Osteoarthritis, multiple sites    Osteopenia    Persistent atrial fibrillation    a. Dx 08/2017; b. CHA2DS2VASc = 6-->Pradaxa; c. 09/2017 Successful DCCV (second shock - 200J); d. 10/2017 Recurrent Afib-->flecainide started 11/2017; e. 06/2020 s/p DCCV (200J x 1); f. 06/2020 Recurrent AF->Flec inc 75bid; g. 09/2020 WCT->flec d/c'd->amio started.   Personal history of radiation therapy 2001   left breast ca   Psoriasis    Wide-complex tachycardia    a. 09/2020  in setting of presumed Flecainide toxicity.  Flecainide d/c'd.    Micro Data:  Nov 04, 2022: COVID-19 PCR>> 4/10: RVP>>  Antimicrobials:   Anti-infectives (From admission, onward)    None        Significant Hospital Events: Including procedures, antibiotic start and stop dates in addition to other pertinent events   11/04/2022: Presented to ED with seizure like activity and syncope.  Found to be in V. Tach, briefly lost pulses for 20 seconds  requiring CPR and defibrillation x2.  Cardiology took emergently to Cath Lab for [facility] and pacer placement.  Returns to ICU and remains intubated.  PCCM consulted.  Interim History / Subjective:  -Patient developed sustained V. tach in the ED and became pulseless requiring roughly 20 seconds of CPR and defibrillation x 2 -She was taken to Cath Lab by cardiology, found to have obstructive cardiac disease, however did not require any stent placements, temporary pacer was placed -Returns to ICU remains intubated -Rhythm is currently paced, blood pressure stable  Objective   Blood pressure 138/63, pulse (!) 47, temperature 98.5 F (36.9 C), resp. rate (!) 22, height 5\' 7"  (1.702 m), weight 47.2 kg, SpO2 98 %.    Vent Mode: AC FiO2 (%):  [60 %] 60 % Set Rate:  [20 bmp] 20 bmp Vt Set:  [400 mL] 400 mL PEEP:  [8 cmH20] 8 cmH20  No intake or output data in the 24 hours ending 04-Nov-2022 1734 Filed Weights   11/04/2022 1525  Weight: 47.2 kg    Examination: General: Acute on chronically ill-appearing frail elderly female, laying in bed, intubated sedated, no acute distress HENT: Atraumatic, normocephalic, neck supple, no JVD Lungs: Coarse breath sounds throughout, even, nonlabored, occasionally overbreathing the vent Cardiovascular: Paced rhythm, no murmurs, rubs, gallops Abdomen: Soft, nontender, nondistended, no guarding or rebound tenderness, bowel sounds positive x 4 Extremities: Extremities are cool, normal bulk and tone, no deformities, chronic changes to the bilateral lower extremities Neuro: Sedated, withdraws from pain, pupils PERRLA, currently not following commands GU: Foley catheter in place draining yellow urine  Resolved Hospital Problem list     Assessment & Plan:   #Sustained V. Tach (with brief loss of pulses, approximately 20 seconds) #Atrial Fibrillation with slow ventricular rates s/p Temporary Transvenous Pacemaker Placement on November 04, 2022 #Chronic HFpEF #Mildly Elevated  Troponin, suspect demand ischemia PMHx: Persistent A.fib, HTN Echocardiogram 08/21/22: LVEF 55-60%, mild LVH, indeterminate diastolic parameters, RV systolic function normal, mild MR -Continuous cardiac monitoring -Maintain MAP >65 -Gentle IV fluids -Vasopressors as needed to maintain MAP goal ~ currently not requiring -Trend lactic acid until normalized -Trend HS Troponin until peaked -Repeat Echocardiogram pending -Check TSH -Cardiology and EP following, appreciate input ~ "Recommend continued temporary transvenous pacing. Rate is currently set at 60 bpm, though if the patient has recurrent PVCs or VT, I recommend increasing the rate as needed to help suppress these. Rate lowering agents such as beta-blockers and calcium channel blockers will be held for the time being. Amiodarone and other QT prolonging medications are also discouraged. I will check magnesium level. Magnesium and potassium should be repleted to target levels greater than 2.0 and 4.0, respectively. "  #Intubated for Airway Protection due to hemodynamic instability & brief cardiac arrest requiring multiple defibrillations -Full vent support, implement lung protective strategies -Plateau pressures less than 30 cm H20 -Wean FiO2 & PEEP as tolerated to maintain O2 sats >92% -Follow intermittent Chest X-ray & ABG as needed -Spontaneous Breathing Trials when respiratory parameters met and mental status permits -  Implement VAP Bundle -Prn Bronchodilators  #Hypocalcemia #Mild Hypokalemia #Mild Hyponatremia #AG Metabolic Acidosis -Monitor I&O's / urinary output -Follow BMP -Ensure adequate renal perfusion -Avoid nephrotoxic agents as able -Replace electrolytes as indicated, Pharmacy consulted to assist with electrolyte replacement -Check Albumin for corrected calcium level  #Severe Hyperglycemia, rule out DKA vs HHS -CBG's q4h; Target range of 140 to 180 -SSI ~ may end up needing insulin gtt once able to rule in DKA vs  HHS -Follow ICU Hypo/Hyperglycemia protocol -Check Beta-hydroxybutyric acid and serum osmolality  #Seizure-like activity #Headache (report of worst HA of her life on 4/9, outpatient CT Head negative) #Sedation needs in the setting of mechanical ventilation PMHx: ETOH abuse -Maintain a RASS goal of 0 to -1 -Propofol and fentanyl as needed to maintain RASS goal -Avoid sedating medications as able -Daily wake up assessment -PRN Versed for breakthrough seizures -Seizure and Aspiration precautions -Repeat CT head is negative for acute intracranial process -Obtain EEG -Check UDS & Ethanol -Consult Neurology in the AM, appreciate input      Best Practice (right click and "Reselect all SmartList Selections" daily)   Diet/type: NPO DVT prophylaxis: Lovenox GI prophylaxis: PPI Lines: N/A Foley:  N/A Code Status:  full code Last date of multidisciplinary goals of care discussion [N/A]  4/10: Will update pt's family when they arrive at bedside.  Labs   CBC: Recent Labs  Lab 08/03/2022 1531  WBC 5.4  HGB 11.2*  HCT 34.9*  MCV 102.6*  PLT 314    Basic Metabolic Panel: Recent Labs  Lab 08/03/2022 1531  NA 133*  K 3.3*  CL 107  CO2 15*  GLUCOSE 466*  BUN 19  CREATININE 0.87  CALCIUM 6.1*   GFR: Estimated Creatinine Clearance: 36.5 mL/min (by C-G formula based on SCr of 0.87 mg/dL). Recent Labs  Lab 08/03/2022 1531  WBC 5.4    Liver Function Tests: No results for input(s): "AST", "ALT", "ALKPHOS", "BILITOT", "PROT", "ALBUMIN" in the last 168 hours. No results for input(s): "LIPASE", "AMYLASE" in the last 168 hours. No results for input(s): "AMMONIA" in the last 168 hours.  ABG    Component Value Date/Time   HCO3 25.4 09/19/2020 2353   TCO2 19 (L) 03/29/2020 2033   ACIDBASEDEF 0.0 09/19/2020 2353   O2SAT 54.9 09/19/2020 2353     Coagulation Profile: No results for input(s): "INR", "PROTIME" in the last 168 hours.  Cardiac Enzymes: No results for input(s):  "CKTOTAL", "CKMB", "CKMBINDEX", "TROPONINI" in the last 168 hours.  HbA1C: Hgb A1c MFr Bld  Date/Time Value Ref Range Status  12/18/2014 10:58 AM 5.3 4.6 - 6.5 % Final    Comment:    Glycemic Control Guidelines for People with Diabetes:Non Diabetic:  <6%Goal of Therapy: <7%Additional Action Suggested:  >8%     CBG: Recent Labs  Lab 08/03/2022 1543  GLUCAP 285*    Review of Systems:   Unable to assess due to intubation/sedation/critical illness   Past Medical History:  She,  has a past medical history of (HFpEF) heart failure with preserved ejection fraction, Acute CHF (congestive heart failure) (09/18/2020), Alcohol abuse (04/05/2020), Breast cancer (2001), Cancer (2001), Carotid arterial disease, Closed right hip fracture (09/20/2019), GERD (gastroesophageal reflux disease), Hyperlipidemia, Hypertension, Osteoarthritis, multiple sites, Osteopenia, Persistent atrial fibrillation, Personal history of radiation therapy (2001), Psoriasis, and Wide-complex tachycardia.   Surgical History:   Past Surgical History:  Procedure Laterality Date   ABDOMINAL HYSTERECTOMY     ANKLE FRACTURE SURGERY  4/08   left---hardware still in place  ATRIAL FIBRILLATION ABLATION N/A 05/01/2022   Procedure: ATRIAL FIBRILLATION ABLATION;  Surgeon: Lanier Prude, MD;  Location: [facility];  Service: Cardiovascular;  Laterality: N/A;   BREAST BIOPSY Left 2001   breast ca   BREAST EXCISIONAL BIOPSY Left yrs ago   benign   BREAST LUMPECTOMY Left 2001   f/u radiation   CARDIOVERSION N/A 10/04/2017   Procedure: CARDIOVERSION;  Surgeon: Iran Ouch, MD;  Location: [facility] ORS;  Service: Cardiovascular;  Laterality: N/A;   CARDIOVERSION N/A 12/16/2017   Procedure: CARDIOVERSION;  Surgeon: Duke Salvia, MD;  Location: [facility] ORS;  Service: Cardiovascular;  Laterality: N/A;   CARDIOVERSION N/A 06/24/2020   Procedure: CARDIOVERSION;  Surgeon: Iran Ouch, MD;  Location: [facility] ORS;  Service:  Cardiovascular;  Laterality: N/A;   CARDIOVERSION N/A 08/10/2022   Procedure: CARDIOVERSION;  Surgeon: Iran Ouch, MD;  Location: [facility] ORS;  Service: Cardiovascular;  Laterality: N/A;   CAROTID ENDARTERECTOMY Left 10/23/2004   FRACTURE SURGERY     OOPHORECTOMY     SHOULDER SURGERY  6/07   left   TONSILLECTOMY AND ADENOIDECTOMY     TOTAL HIP ARTHROPLASTY  2004   right     Social History:   reports that she quit smoking about 27 years ago. Her smoking use included cigarettes. She has a 40.00 pack-year smoking history. She has never used smokeless tobacco. She reports current alcohol use of about 14.0 standard drinks of alcohol per week. She reports that she does not use drugs.   Family History:  Her family history includes Hypertension in her brother; Leukemia in her father; Pneumonia in her mother. There is no history of Heart disease, Diabetes, or Breast cancer.   Allergies No Known Allergies   Home Medications  Prior to Admission medications   Medication Sig Start Date End Date Taking? Authorizing Provider  acetaminophen (TYLENOL) 325 MG tablet Take 2 tablets (650 mg total) by mouth every 4 (four) hours as needed for headache or mild pain. 09/19/20   Swayze, Ava, DO  allopurinol (ZYLOPRIM) 100 MG tablet TAKE 1.5 TABLETS (150 MG TOTAL) BY MOUTH DAILY 08/07/22   Glori Luis, MD  amiodarone (PACERONE) 200 MG tablet Take 1 tablet (200 mg total) by mouth 2 (two) times daily. 07/27/22   Fenton, Clint R, PA  amoxicillin-clavulanate (AUGMENTIN) 500-125 MG tablet Take 1 tablet by mouth in the morning and at bedtime. 10/27/22   Glori Luis, MD  apixaban (ELIQUIS) 2.5 MG TABS tablet TAKE 1 TABLET BY MOUTH TWICE A DAY 05/13/22   Lanier Prude, MD  cholecalciferol (VITAMIN D) 25 MCG (1000 UNIT) tablet Take 2 tablets (2,000 Units total) by mouth daily. 09/20/20   Swayze, Ava, DO  gabapentin (NEURONTIN) 100 MG capsule Take 1 capsule (100 mg total) by mouth 3 (three) times daily. 08/26/22    Leeroy Bock, MD  LORazepam (ATIVAN) 0.5 MG tablet TAKE 1 TABLET BY MOUTH TWICE A DAY AS NEEDED FOR ANXIETY 10/23/22   Glori Luis, MD  metoprolol tartrate (LOPRESSOR) 25 MG tablet Take 1 tablet (25 mg total) by mouth 3 (three) times daily. 10/27/22   Duke Salvia, MD  mirtazapine (REMERON) 30 MG tablet Take 1 tablet (30 mg total) by mouth at bedtime. 06/09/22   Glori Luis, MD  Multiple Vitamin (MULTIVITAMIN) capsule Take 1 capsule by mouth daily.    [provider]  Multiple Vitamins-Minerals (PRESERVISION AREDS 2+MULTI VIT PO) Take 1 capsule by mouth in the morning  and at bedtime.    [provider]  predniSONE (DELTASONE) 20 MG tablet Take 2 tablets (40 mg total) by mouth daily with breakfast. 10/27/22   Glori Luis, MD     Critical care time: 55 minutes     Harlon Ditty, AGACNP-BC [facility] Prefer epic messenger for cross cover needs If after hours, please call E-link

## 2022-10-28 NOTE — ED Provider Notes (Signed)
[facility] Provider Note    Event Date/Time   First MD Initiated Contact with Patient 11/13/22 1518     (approximate)  History   Chief Complaint: Seizures and Irregular Heart Beat  HPI  [name] is a 82 y.o. female with a past medical history of CHF, atrial fibrillation, gastric reflux, hypertension, hyperlipidemia, presents to the emergency department for syncope versus seizure.  According to EMS they were told that the patient had a likely seizure episode.  They state when they had the patient in the ambulance they noticed what appeared to be ventricular tachycardia on the cardiac monitor followed by a brief episode of seizure activity in which the patient was unconscious and had tremor of all extremities.  The patient then awoke with no postictal period.  Upon arrival to the emergency department patient is bradycardic around 45 bpm.  She is awake she is alert she is oriented and mentating appropriately.  Blood pressure currently 123/65.  Patient denies any chest pain but does states some generalized body ache/soreness.  Physical Exam   Triage Vital Signs: ED Triage Vitals  Enc Vitals Group     BP 2022-11-13 1529 123/65     Pulse Rate 2022/11/13 1529 (!) 51     Resp 11/13/2022 1529 18     Temp --      Temp Source 2022/11/13 1529 Oral     SpO2 November 13, 2022 1529 99 %     Weight 11-13-22 1525 104 lb (47.2 kg)     Height November 13, 2022 1525 5\' 7"  (1.702 m)     Head Circumference --      Peak Flow --      Pain Score 2022/11/13 1523 3     Pain Loc --      Pain Edu? --      Excl. in GC? --     Most recent vital signs: Vitals:   November 13, 2022 1529 13-Nov-2022 1530  BP: 123/65   Pulse: (!) 51 (!) 46  Resp: 18   SpO2: 99%     General: Awake, no distress.  CV:  Good peripheral perfusion.  Fairly regular Resp:  Normal effort.  Equal breath sounds bilaterally.  Abd:  No distention.  Soft, nontender.  No rebound or guarding.   ED Results / Procedures / Treatments    EKG  EKG viewed and interpreted by myself.  Show atrial fibrillation with a bradycardic rate at 44 bpm with a narrow QRS, normal axis, largely normal intervals nonspecific ST changes occasional PVC.  MEDICATIONS ORDERED IN ED: Medications - No data to display   IMPRESSION / MDM / ASSESSMENT AND PLAN / ED COURSE  I reviewed the triage vital signs and the nursing notes.  Patient's presentation is most consistent with acute presentation with potential threat to life or bodily function.  Patient presents to the emergency department for syncope versus seizure.  Per EMS they noted what they thought was ventricular tachycardia followed by tremor and unconsciousness.  Unclear if this was a true seizure versus hypoxic jerking and syncopal episode.  They did dose amiodarone.  Upon arrival to the emergency department patient is in A-fib but appears to be quite bradycardia around 40 bpm.  Watching the patient's cardiac monitor in the room she will occasionally bradycardia down as low as 31 bpm.  At times when she has a heart rate around 50 bpm she is often in bigeminy with an ectopic beat every other beat with no palpable pulse during her  ectopic beats.  However patient is maintaining a good blood pressure.  We will place the patient on a ZOLL monitor.  We will check labs including electrolytes and cardiac enzymes.  We will continue to closely monitor the patient in the emergency department.  In reviewing the patient's medication list it appears that she is on oral amiodarone at home as well as Eliquis and metoprolol.  Amiodarone or metoprolol could be the cause of the patient's bradycardia.  We will begin gently IV hydrating.  Patient will need admission regardless for further workup treatment and monitoring as well as likely washout of her rate control medications.  Patient had several runs of ventricular tachycardia.  Some of the runs were 4-5 beats and some were more prolonged.  Patient lost consciousness  several times during sustained ventricular tachycardia.  I spoke to Dr. Darrold JunkerParaschos of cardiology who is on-call for the STEMI pager as the patient may need a temporary pace maker given her intermittent significant bradycardia followed by runs of ventricular tachycardia.  Patient was maintained on amiodarone infusion throughout this time.  At 1 point the patient did briefly lose pulses and approximately 20 seconds of CPR was initiated followed by a synchronized cardioversion and return of pulses.  At this point I made the decision to intubate the patient with etomidate and succinylcholine.  [facility] cardiologist Dr. Okey DupreEnd who came to to the room quickly and spoke to the patient's EP cardiologist Dr. Lalla BrothersLambert was in the emergency department and came to see the patient as well.  In total patient received 2 synchronized cardioversions.  Patient is now on propofol for sedation.  Cardiology had decided to take the patient for temporary pacemaker as well as a cardiac catheterization.  Patient's power of attorney who is the patient's daughter lives in [place] I spoke to her And informed her of the situation.  Patient's other daughter who lives locally is here in the emergency department and was able to see the patient before she went to the cardiac catheterization lab.  Before the patient lost consciousness she was awake alert oriented, we did discuss whether or not she would want advanced procedures done such as intubation CPR and did discuss the possibility of a pacemaker as well.  Patient stated that she would want any of these interventions performed if we thought that she could have a good quality of life going forward.  Lab work has begun to result showing a reassuring CBC, troponin of 32.  Chemistry pending.  CBG of 285.  INTUBATION Performed by: Minna AntisKevin Juliann Olesky  Required items: required blood products, implants, devices, and special equipment available Patient identity confirmed: provided  demographic data and hospital-assigned identification number Time out: Immediately prior to procedure a "time out" was called to verify the correct patient, procedure, equipment, support staff and site/side marked as required.  Indications: Cardiac arrest  Intubation method: S4 Glidescope Laryngoscopy   Preoxygenation: 100% BVM  Sedatives: 20 mg etomidate Paralytic: 100 mg succinylcholine  Tube Size: 1.5 cuffed  Post-procedure assessment: chest rise and ETCO2 monitor Breath sounds: equal and absent over the epigastrium Tube secured with: ETT holder Chest x-ray interpreted by radiologist and me. Patient tolerated the procedure well with no immediate complications.  Cardiopulmonary Resuscitation (CPR) Procedure Note Directed/Performed by: Minna AntisKevin Marylee Belzer I personally directed ancillary staff and/or performed CPR in an effort to regain return of spontaneous circulation and to maintain cardiac, neuro and systemic perfusion.   CRITICAL CARE Performed by: Minna AntisKevin Lissete Maestas   Total critical  care time: 60 minutes  Critical care time was exclusive of separately billable procedures and treating other patients.  Critical care was necessary to treat or prevent imminent or life-threatening deterioration.  Critical care was time spent personally by me on the following activities: development of treatment plan with patient and/or surrogate as well as nursing, discussions with consultants, evaluation of patient's response to treatment, examination of patient, obtaining history from patient or surrogate, ordering and performing treatments and interventions, ordering and review of laboratory studies, ordering and review of radiographic studies, pulse oximetry and re-evaluation of patient's condition.     FINAL CLINICAL IMPRESSION(S) / ED DIAGNOSES   Symptomatic bradycardia Syncope Cardiac arrest Ventricular tachycardia    Note:  This document was prepared using Dragon voice  recognition software and may include unintentional dictation errors.   Minna Antis, MD 10/27/2022 [phone]

## 2022-10-28 NOTE — Consult Note (Signed)
Electrophysiology Consultation   Patient ID: TAYONA BECKERT MRN: [id number]; DOB: Apr 03, 1941  Admit date: 11/08/2022 Date of Consult: 10/31/2022  PCP:  Glori Luis, MD   [facility] Providers Cardiologist:  Sherryl Manges, MD  Electrophysiologist:  Lanier Prude, MD  {   History of Present Illness:   Ms. Latham is an 82 year old woman who I am seeing today for an evaluation of recurrent polymorphic ventricular tachycardia at the request of Dr. Lenard Lance.  The patient is known to me from the outpatient setting and has a history of longstanding persistent atrial fibrillation, alcohol use/dependence, hypertension, chronic diastolic heart failure. She has a history of atrial fibrillation ablation in October 2023 during which the pulmonary veins and posterior wall were isolated.  Unfortunately, she had recurrence of atrial fibrillation.  She has had multiple prior cardioversions as well.  After her recent recurrences of atrial fibrillation, amiodarone was started.  She was taking a stable dose of oral amiodarone in the outpatient setting.  The patient had been in a nursing facility after a fall in January.  Given her worsening overall health status, her redo catheter ablation was canceled.  She actually saw Dr. Graciela Husbands in clinic on October 27, 2022.  At that appointment she reported one of the worst headaches of her life lasting for the last 3 days.  They discussed treatment options for her atrial fibrillation and decided to pursue pacemaker implant with AV nodal ablation.  Given the headache severity, she was sent for a CT scan which did not show any evidence of hemorrhage.  She went home.  She presented back to the emergency department with seizure-like activity.  EMS was called and found her to have salvos of polymorphic VT.  During the episodes of polymorphic VT, she would lose consciousness and shake.  In the emergency department she was cardioverted at least twice and  required brief CPR.  She was intubated and sedated.  She was noted to be bradycardic with salvos of nonsustained and sustained ventricular tachycardia.  The on-call interventional cardiologist was summoned to the bedside and took her to the Cath Lab for a temporary transvenous pacemaker.  I was present during this decision making and recommended TVP plus left heart cath and repeat CT scan to exclude evolving intracranial hemorrhage.  Family was updated at the bedside.  Family reports several deaths in the immediate family over the last week.  Past medical, surgical, family and social history reviewed.  Allergies:   No Known Allergies  ROS:  Please see the history of present illness.  All other ROS reviewed and negative.     Physical Exam/Data:   Vitals:   11/01/2022 1618 10/30/2022 1620 10/21/2022 1630 10/27/2022 1652  BP:   138/63   Pulse: (!) 43 76 (!) 47   Resp: (!) 23 (!) 22 (!) 22   Temp:   98.5 F (36.9 C)   TempSrc:      SpO2: 94% 93% 98% 98%  Weight:      Height:       No intake or output data in the 24 hours ending 11/11/2022 1712    10/28/2022    3:25 PM 10/27/2022    9:08 AM 10/19/2022   11:15 AM  Last 3 Weights  Weight (lbs) 104 lb 109 lb 6.4 oz 106 lb 12.8 oz  Weight (kg) 47.174 kg 49.624 kg 48.444 kg     Body mass index is 16.29 kg/m. Marland Kitchen      General: Elderly,  frail, intubated and sedated Cardiac: Bradycardic, regular rhythm, tepid extremities   EKG:   Multiple EKGs were reviewed and show polymorphic VT, sinus rhythm with a prolonged QTc greater than 600 ms, sinus rhythm with salvos of nonsustained VT.  There is a marked difference in her QTc today compared to yesterday.     Assessment and Plan:   Ms. Early Chars is an 82 year old woman who presented to the ER today with seizure-like activity in the setting of salvos of polymorphic VT.  I suspect her polymorphic VT is due to profound bradycardia after cardioverting from atrial fibrillation to sinus bradycardia.  I do  think it is important to exclude an evolving intracranial hemorrhage given her headache history.  An evolving Takotsubo cardiomyopathy may also be contributing given the loss of several first-degree relatives and her left ventricular gram during today's left heart catheterization.  Recommend stopping amiodarone drip for now.  Maintain temporary pacing with a minimal rate of 60 bpm.  Okay to increase pacing rate to 90 bpm should ventricular ectopy resume  Avoid QT prolonging medications.  Keep K greater than 4 and mag greater than 2  Dr. Graciela Husbands to see tomorrow and (Thursday).  Appreciate ICU team care   For questions or updates, please contact [facility] Please consult [email or web address] for contact info under    Signed, Lanier Prude, MD  11/06/2022 5:12 PM

## 2022-10-28 NOTE — ED Notes (Signed)
Pt sent into in VT and lost consciousness. EDP called to bedside. Amio ordered.Pt is conscious now.

## 2022-10-28 NOTE — Progress Notes (Incomplete)
2325: Notified NP Cheryll Cockayne: pt pressure dropped 70/50 map 60. paused fentanyl, propofol, mag IVPB.

## 2022-10-28 NOTE — Consult Note (Addendum)
Cardiology Consultation   Patient ID: [name] MRN: [id number]; DOB: 18-Nov-1940  Admit date: 11-09-22 Date of Consult: 09-Nov-2022  PCP:  Glori Luis, MD   [facility] Providers Cardiologist:  Sherryl Manges, MD  Electrophysiologist:  Lanier Prude, MD  {   Patient Profile:   [name] is a 82 y.o. female with a hx of nonosbtructive cAD by Cardiac CT 02/2022, persistent Afib with prior cardioversion with prior ablation s/p afib recurrence, anemia, HTN, HFpEF, orthostatic hypertension prior alcohol intake who is being seen 11-09-22 for the evaluation of V. Tach at the request of Dr. Lenard Lance.  History of Present Illness:   [name] with history of afib previously failed flecainide, with multiple cardioversions, prior ablation on amiodarone. She was to under go repeat afib ablation in April 2024, but this was canceled.  She saw Dr. Graciela Husbands 10/27/22 reporting the worse headache of her life. Head CT was ordered, which was normal.   The patient had a cardiac CTA 02/2022 showing coronary calcium score of 211, calcified plaque causing mild LAD and Lcx stenosis 25-49%, calcified plaque in the mid LM causing minimal stenosis, mild nonobstructive CAD.   The patient was brought from Nursing home to [facility] ER for seizure-like activity. EMS found the patient to be in VT en route to the ER. Noted to have seizure with LOC and extremity tremors. Patient awoke with no post-ictal.   On arrival tot he ER she was alert and oriented. HR 51bpm, 995O2, normal BP. Complained of muscle aches and sorenss.   Tele showed afib with a slow ventricular response into the 30-40s. Noted to go in and out of VT, she lost consciousness and required CPR, started on IV amiodarone. Went into sustained VT, lost pulses, and patient was subsequently intubated and shocked x 2. Cardiology and EP saw patient in the ER. Patient was taken emergently to the cath lab.  Labs HS trop 32, WBC 5.4,  Hgb 11.2. Other labs pending.  Past Medical History:  Diagnosis Date   (HFpEF) heart failure with preserved ejection fraction    a. 08/2017 Echo: EF 55-60%, no rwma, mild MR, mildly dil LA, nl RV fxn; b. 03/2020 Echo: EF 60-65%, no rwma, Gr2 DD. RVSP 44.34mmHg. Mod dil LA. Mild MR; c. 09/2020 Echo: EF 50-55%, no rwma, mild LVH, mod red RV fxn, mildly dily RA.   Acute CHF (congestive heart failure) 09/18/2020   Alcohol abuse 04/05/2020   Breast cancer 2001   left breast   Cancer 2001   left breast ca   Carotid arterial disease    a. 10/2004 s/p L CEA; b. 12/2015 Carotid U/S: RICA 1-39%; b. LICA patent CEA site.   Closed right hip fracture 09/20/2019   GERD (gastroesophageal reflux disease)    Hyperlipidemia    Hypertension    Osteoarthritis, multiple sites    Osteopenia    Persistent atrial fibrillation    a. Dx 08/2017; b. CHA2DS2VASc = 6-->Pradaxa; c. 09/2017 Successful DCCV (second shock - 200J); d. 10/2017 Recurrent Afib-->flecainide started 11/2017; e. 06/2020 s/p DCCV (200J x 1); f. 06/2020 Recurrent AF->Flec inc 75bid; g. 09/2020 WCT->flec d/c'd->amio started.   Personal history of radiation therapy 2001   left breast ca   Psoriasis    Wide-complex tachycardia    a. 09/2020 in setting of presumed Flecainide toxicity.  Flecainide d/c'd.    Past Surgical History:  Procedure Laterality Date   ABDOMINAL HYSTERECTOMY     ANKLE FRACTURE SURGERY  4/08   left---hardware still in place   ATRIAL FIBRILLATION ABLATION N/A 05/01/2022   Procedure: ATRIAL FIBRILLATION ABLATION;  Surgeon: Lanier Prude, MD;  Location: [facility] INVASIVE CV LAB;  Service: Cardiovascular;  Laterality: N/A;   BREAST BIOPSY Left 2001   breast ca   BREAST EXCISIONAL BIOPSY Left yrs ago   benign   BREAST LUMPECTOMY Left 2001   f/u radiation   CARDIOVERSION N/A 10/04/2017   Procedure: CARDIOVERSION;  Surgeon: Iran Ouch, MD;  Location: [facility] ORS;  Service: Cardiovascular;  Laterality: N/A;   CARDIOVERSION N/A 12/16/2017    Procedure: CARDIOVERSION;  Surgeon: Duke Salvia, MD;  Location: [facility] ORS;  Service: Cardiovascular;  Laterality: N/A;   CARDIOVERSION N/A 06/24/2020   Procedure: CARDIOVERSION;  Surgeon: Iran Ouch, MD;  Location: [facility] ORS;  Service: Cardiovascular;  Laterality: N/A;   CARDIOVERSION N/A 08/10/2022   Procedure: CARDIOVERSION;  Surgeon: Iran Ouch, MD;  Location: [facility] ORS;  Service: Cardiovascular;  Laterality: N/A;   CAROTID ENDARTERECTOMY Left 10/23/2004   FRACTURE SURGERY     OOPHORECTOMY     SHOULDER SURGERY  6/07   left   TONSILLECTOMY AND ADENOIDECTOMY     TOTAL HIP ARTHROPLASTY  2004   right     Home Medications:  Prior to Admission medications   Medication Sig Start Date End Date Taking? Authorizing Provider  acetaminophen (TYLENOL) 325 MG tablet Take 2 tablets (650 mg total) by mouth every 4 (four) hours as needed for headache or mild pain. 09/19/20   Swayze, Ava, DO  allopurinol (ZYLOPRIM) 100 MG tablet TAKE 1.5 TABLETS (150 MG TOTAL) BY MOUTH DAILY 08/07/22   Glori Luis, MD  amiodarone (PACERONE) 200 MG tablet Take 1 tablet (200 mg total) by mouth 2 (two) times daily. 07/27/22   Fenton, Clint R, PA  amoxicillin-clavulanate (AUGMENTIN) 500-125 MG tablet Take 1 tablet by mouth in the morning and at bedtime. 10/27/22   Glori Luis, MD  apixaban (ELIQUIS) 2.5 MG TABS tablet TAKE 1 TABLET BY MOUTH TWICE A DAY 05/13/22   Lanier Prude, MD  cholecalciferol (VITAMIN D) 25 MCG (1000 UNIT) tablet Take 2 tablets (2,000 Units total) by mouth daily. 09/20/20   Swayze, Ava, DO  gabapentin (NEURONTIN) 100 MG capsule Take 1 capsule (100 mg total) by mouth 3 (three) times daily. 08/26/22   Leeroy Bock, MD  LORazepam (ATIVAN) 0.5 MG tablet TAKE 1 TABLET BY MOUTH TWICE A DAY AS NEEDED FOR ANXIETY 10/23/22   Glori Luis, MD  metoprolol tartrate (LOPRESSOR) 25 MG tablet Take 1 tablet (25 mg total) by mouth 3 (three) times daily. 10/27/22   Duke Salvia, MD   mirtazapine (REMERON) 30 MG tablet Take 1 tablet (30 mg total) by mouth at bedtime. 06/09/22   Glori Luis, MD  Multiple Vitamin (MULTIVITAMIN) capsule Take 1 capsule by mouth daily.    [provider]  Multiple Vitamins-Minerals (PRESERVISION AREDS 2+MULTI VIT PO) Take 1 capsule by mouth in the morning and at bedtime.    [provider]  predniSONE (DELTASONE) 20 MG tablet Take 2 tablets (40 mg total) by mouth daily with breakfast. 10/27/22   Glori Luis, MD    Inpatient Medications: Scheduled Meds:  Continuous Infusions:  amiodarone 60 mg/hr (10/30/2022 1551)   amiodarone     propofol     PRN Meds: etomidate, propofol, succinylcholine  Allergies:   No Known Allergies  Social History:   Social History   Socioeconomic History  Marital status: Widowed    Spouse name: Not on file   Number of children: Not on file   Years of education: Not on file   Highest education level: Not on file  Occupational History   Occupation: Marketing    Comment: Retired  Tobacco Use   Smoking status: Former    Packs/day: 1.00    Years: 40.00    Additional pack years: 0.00    Total pack years: 40.00    Types: Cigarettes    Quit date: 07/21/1995    Years since quitting: 27.2   Smokeless tobacco: Never   Tobacco comments:    Former smoker 05/15/22  Vaping Use   Vaping Use: Never used  Substance and Sexual Activity   Alcohol use: Yes    Alcohol/week: 14.0 standard drinks of alcohol    Types: 7 Glasses of wine, 7 Standard drinks or equivalent per week    Comment: 1 glass of wine and 1 drink of liquor daily 05/15/22   Drug use: No   Sexual activity: Never  Other Topics Concern   Not on file  Social History Narrative   1 natural child   3 adopted children   Artist---still teaches water colors   Husband has Alzheimers      Has living will   Daughter Amy is health care POA   DNR    No tube feeds if cognitively unaware   Social Determinants of Health    Financial Resource Strain: Low Risk  (02/13/2022)   Overall Financial Resource Strain (CARDIA)    Difficulty of Paying Living Expenses: Not hard at all  Food Insecurity: No Food Insecurity (02/13/2022)   Hunger Vital Sign    Worried About Running Out of Food in the Last Year: Never true    Ran Out of Food in the Last Year: Never true  Transportation Needs: No Transportation Needs (02/13/2022)   PRAPARE - Administrator, Civil Service (Medical): No    Lack of Transportation (Non-Medical): No  Physical Activity: Unknown (02/10/2021)   Exercise Vital Sign    Days of Exercise per Week: 0 days    Minutes of Exercise per Session: Not on file  Stress: No Stress Concern Present (02/13/2022)   [employer] of Occupational Health - Occupational Stress Questionnaire    Feeling of Stress : Not at all  Social Connections: Unknown (02/13/2022)   Social Connection and Isolation Panel [NHANES]    Frequency of Communication with Friends and Family: More than three times a week    Frequency of Social Gatherings with Friends and Family: More than three times a week    Attends Religious Services: Not on file    Active Member of Clubs or Organizations: Not on file    Attends Banker Meetings: Not on file    Marital Status: Not on file  Intimate Partner Violence: Not At Risk (02/13/2022)   Humiliation, Afraid, Rape, and Kick questionnaire    Fear of Current or Ex-Partner: No    Emotionally Abused: No    Physically Abused: No    Sexually Abused: No    Family History:    Family History  Problem Relation Age of Onset   Hypertension Brother    Pneumonia Mother    Leukemia Father    Heart disease Neg Hx    Diabetes Neg Hx    Breast cancer Neg Hx      ROS:  Please see the history of present illness.   All  other ROS reviewed and negative.     Physical Exam/Data:   Vitals:   10/30/2022 1608 10/24/2022 1609 11/03/2022 1610 11/14/2022 1611  BP:      Pulse: 93 68 (!) 36 (!) 31   Resp: 19 (!) 38 (!) 37 (!) 34  TempSrc:      SpO2: 90% (!) 74% (!) 77% (!) 82%  Weight:      Height:       No intake or output data in the 24 hours ending 10/23/2022 1619    10/22/2022    3:25 PM 10/27/2022    9:08 AM 10/19/2022   11:15 AM  Last 3 Weights  Weight (lbs) 104 lb 109 lb 6.4 oz 106 lb 12.8 oz  Weight (kg) 47.174 kg 49.624 kg 48.444 kg     Body mass index is 16.29 kg/m.  General:  intubated and sedated frail  HEENT: normal Neck: no JVD Vascular: No carotid bruits; Distal pulses 2+ bilaterally Cardiac:  normal S1, S2; RRR; no murmur  Lungs:  clear to auscultation bilaterally, no wheezing, rhonchi or rales  Abd: soft, nontender, no hepatomegaly  Ext: no edema Musculoskeletal:  No deformities, BUE and BLE strength normal and equal Skin: warm and dry  Neuro:  intubated and sedated Psych:  Normal affect   EKG:  The EKG was personally reviewed and demonstrates:  afib with slow ventricular reponse, 44bpm, PVCs Telemetry:  Telemetry was personally reviewed and demonstrates:  afib with a slow ventricular response 40s, PVCs, monomorphic and polymorphic VT up to 160s  Relevant CV Studies:  Echo 08/21/22 1. Left ventricular ejection fraction, by estimation, is 55 to 60%. The  left ventricle has normal function. The left ventricle has no regional  wall motion abnormalities. There is mild left ventricular hypertrophy.  Left ventricular diastolic parameters  are indeterminate.   2. Right ventricular systolic function is normal. The right ventricular  size is normal.   3. The mitral valve is normal in structure. Mild mitral valve  regurgitation. No evidence of mitral stenosis.   4. The aortic valve is normal in structure. Aortic valve regurgitation is  not visualized. No aortic stenosis is present.   5. The inferior vena cava is normal in size with greater than 50%  respiratory variability, suggesting right atrial pressure of 3 mmHg.   Laboratory Data:  High Sensitivity  Troponin:  No results for input(s): "TROPONINIHS" in the last 720 hours.   ChemistryNo results for input(s): "NA", "K", "CL", "CO2", "GLUCOSE", "BUN", "CREATININE", "CALCIUM", "MG", "GFRNONAA", "GFRAA", "ANIONGAP" in the last 168 hours.  No results for input(s): "PROT", "ALBUMIN", "AST", "ALT", "ALKPHOS", "BILITOT" in the last 168 hours. Lipids No results for input(s): "CHOL", "TRIG", "HDL", "LABVLDL", "LDLCALC", "CHOLHDL" in the last 168 hours.  Hematology Recent Labs  Lab 11/10/2022 1531  WBC 5.4  RBC 3.40*  HGB 11.2*  HCT 34.9*  MCV 102.6*  MCH 32.9  MCHC 32.1  RDW 16.1*  PLT 314   Thyroid No results for input(s): "TSH", "FREET4" in the last 168 hours.  BNPNo results for input(s): "BNP", "PROBNP" in the last 168 hours.  DDimer No results for input(s): "DDIMER" in the last 168 hours.   Radiology/Studies:  CT HEAD WO CONTRAST (5MM)  Result Date: 10/27/2022 CLINICAL DATA:  82 year old female sudden severe headache on anticoagulation. EXAM: CT HEAD WITHOUT CONTRAST TECHNIQUE: Contiguous axial images were obtained from the base of the skull through the vertex without intravenous contrast. RADIATION DOSE REDUCTION: This exam was performed according to the  departmental dose-optimization program which includes automated exposure control, adjustment of the mA and/or kV according to patient size and/or use of iterative reconstruction technique. COMPARISON:  Brain MRI 10/20/2004.  Head CT 11/15/2021. FINDINGS: Brain: Stable cerebral volume. No midline shift, ventriculomegaly, mass effect, evidence of mass lesion, intracranial hemorrhage or evidence of cortically based acute infarction. Patchy bilateral cerebral white matter hypodensity appears stable from last year. Vascular: Calcified atherosclerosis at the skull base. No suspicious intracranial vascular hyperdensity. Skull: No acute osseous abnormality identified. Sinuses/Orbits: Widespread paranasal sinus mucosal thickening with some bubbly opacity  and occasional sinus opacification (left sphenoid) is new from last year. Tympanic cavities and mastoids remain clear. Other: No acute orbit or scalp soft tissue finding. IMPRESSION: 1. No acute intracranial abnormality. Stable non contrast CT appearance of mild to moderate for age cerebral white matter changes. 2. Widespread paranasal sinus inflammation is new from last year. Consider Acute Sinusitis. Electronically Signed   By: Odessa Fleming M.D.   On: 10/27/2022 10:31     Assessment and Plan:   Sustained VT - presented from a nursing home with seizure like complaints found to have VT en route by EMS requiring CPR, shocks x 2, IV amiodarone and eventual intubation - second shock in the ED with conversion to afib with slow ventricular response vs junctional bradycardia - tele shows afib with slow ventricular response in the 40s, frequent PVCs, and sustained VT (monomorphic and polymorphic) - suspect brady-mediated - EP saw in the ER - stop amiodarone - No BB given bradycardia - keep Mag>2 and K>4 - check TSH - check echo. Prior echo with normal EF 12/2021 - nonobstructive CAD on cardiac CTA 02/2022 - patient was taken emergently to Cath lab for temp wire and heart cath.  - patient complained of severe headache at 4/9 appointment and seizure like activity today. Head CT was negative. Would repeat imaging after cath procedure.  H/o Persistent Afib Afib with slow ventricular rates - h/o persistent afib with multiple failed cardioversions and prior ablation. Repeat ablation canceled in April 2024 - on amiodarone>hold as above - hold PTA Eliquis for heart cath - plan as above - EP is following  HFpEF - prior echo with normal LVEF - repeat echo   For questions or updates, please contact [facility] Please consult [email or web address] for contact info under    Signed, Nichols Corter David Stall, PA-C  10/23/2022 4:19 PM

## 2022-10-29 ENCOUNTER — Encounter: Payer: Self-pay | Admitting: Cardiology

## 2022-10-29 ENCOUNTER — Inpatient Hospital Stay: Payer: Medicare Other

## 2022-10-29 DIAGNOSIS — I5021 Acute systolic (congestive) heart failure: Secondary | ICD-10-CM

## 2022-10-29 DIAGNOSIS — I4819 Other persistent atrial fibrillation: Secondary | ICD-10-CM | POA: Diagnosis not present

## 2022-10-29 DIAGNOSIS — J9691 Respiratory failure, unspecified with hypoxia: Secondary | ICD-10-CM

## 2022-10-29 DIAGNOSIS — I4729 Other ventricular tachycardia: Secondary | ICD-10-CM | POA: Diagnosis not present

## 2022-10-29 DIAGNOSIS — Z515 Encounter for palliative care: Secondary | ICD-10-CM

## 2022-10-29 DIAGNOSIS — Z7189 Other specified counseling: Secondary | ICD-10-CM

## 2022-10-29 LAB — ECHOCARDIOGRAM COMPLETE
Height: 67 in
S' Lateral: 2.6 cm
Weight: 1664 [oz_av]

## 2022-10-29 LAB — HEPATIC FUNCTION PANEL
ALT: 54 U/L — ABNORMAL HIGH (ref 0–44)
AST: 75 U/L — ABNORMAL HIGH (ref 15–41)
Albumin: 2.7 g/dL — ABNORMAL LOW (ref 3.5–5.0)
Alkaline Phosphatase: 279 U/L — ABNORMAL HIGH (ref 38–126)
Bilirubin, Direct: 0.1 mg/dL (ref 0.0–0.2)
Indirect Bilirubin: 0.5 mg/dL (ref 0.3–0.9)
Total Bilirubin: 0.6 mg/dL (ref 0.3–1.2)
Total Protein: 5.7 g/dL — ABNORMAL LOW (ref 6.5–8.1)

## 2022-10-29 LAB — CBC
HCT: 30.5 % — ABNORMAL LOW (ref 36.0–46.0)
Hemoglobin: 10.1 g/dL — ABNORMAL LOW (ref 12.0–15.0)
MCH: 32.8 pg (ref 26.0–34.0)
MCHC: 33.1 g/dL (ref 30.0–36.0)
MCV: 99 fL (ref 80.0–100.0)
Platelets: 323 10*3/uL (ref 150–400)
RBC: 3.08 MIL/uL — ABNORMAL LOW (ref 3.87–5.11)
RDW: 15.9 % — ABNORMAL HIGH (ref 11.5–15.5)
WBC: 10.5 10*3/uL (ref 4.0–10.5)
nRBC: 0 % (ref 0.0–0.2)

## 2022-10-29 LAB — RENAL FUNCTION PANEL
Albumin: 2.7 g/dL — ABNORMAL LOW (ref 3.5–5.0)
Anion gap: 8 (ref 5–15)
BUN: 21 mg/dL (ref 8–23)
CO2: 24 mmol/L (ref 22–32)
Calcium: 8.8 mg/dL — ABNORMAL LOW (ref 8.9–10.3)
Chloride: 106 mmol/L (ref 98–111)
Creatinine, Ser: 0.87 mg/dL (ref 0.44–1.00)
GFR, Estimated: >60 mL/min (ref >60)
Glucose, Bld: 99 mg/dL (ref 70–99)
Phosphorus: 2.8 mg/dL (ref 2.5–4.6)
Potassium: 3.8 mmol/L (ref 3.5–5.1)
Sodium: 138 mmol/L (ref 135–145)

## 2022-10-29 LAB — GLUCOSE, CAPILLARY
Glucose-Capillary: 80 mg/dL (ref 70–99)
Glucose-Capillary: 92 mg/dL (ref 70–99)

## 2022-10-29 LAB — RESPIRATORY PANEL BY PCR

## 2022-10-29 LAB — PROTIME-INR
INR: 1.4 — ABNORMAL HIGH (ref 0.8–1.2)
INR: 1.2 (ref 0.8–1.2)
Prothrombin Time: 16.7 seconds — ABNORMAL HIGH (ref 11.4–15.2)
Prothrombin Time: 15.5 seconds — ABNORMAL HIGH (ref 11.4–15.2)

## 2022-10-29 LAB — BLOOD GAS, VENOUS
Acid-Base Excess: 0.8 mmol/L (ref 0.0–2.0)
Bicarbonate: 25.3 mmol/L (ref 20.0–28.0)
FIO2: 40 %
MECHVT: 400 mL
Mechanical Rate: 20
O2 Saturation: 78 %
PEEP: 8 cmH2O
Patient temperature: 37
pCO2, Ven: 39 mmHg — ABNORMAL LOW (ref 44–60)
pH, Ven: 7.42 (ref 7.25–7.43)
pO2, Ven: 49 mmHg — ABNORMAL HIGH (ref 32–45)

## 2022-10-29 LAB — COOXEMETRY PANEL
Carboxyhemoglobin: <0.3 % — ABNORMAL LOW (ref 0.5–1.5)
Methemoglobin: <0.7 % (ref 0.0–1.5)
O2 Saturation: 29.9 %
Total hemoglobin: 16.7 g/dL — ABNORMAL HIGH (ref 12.0–16.0)
Total oxygen content: 29.7 %

## 2022-10-29 LAB — PROCALCITONIN: Procalcitonin: 0.18 ng/mL

## 2022-10-29 LAB — LACTIC ACID, PLASMA: Lactic Acid, Venous: 1.7 mmol/L (ref 0.5–1.9)

## 2022-10-29 LAB — BASIC METABOLIC PANEL
Anion gap: 8 (ref 5–15)
BUN: 22 mg/dL (ref 8–23)
CO2: 23 mmol/L (ref 22–32)
Calcium: 8.8 mg/dL — ABNORMAL LOW (ref 8.9–10.3)
Chloride: 106 mmol/L (ref 98–111)
Creatinine, Ser: 0.83 mg/dL (ref 0.44–1.00)
GFR, Estimated: >60 mL/min (ref >60)
Glucose, Bld: 97 mg/dL (ref 70–99)
Potassium: 3.7 mmol/L (ref 3.5–5.1)
Sodium: 137 mmol/L (ref 135–145)

## 2022-10-29 LAB — MAGNESIUM: Magnesium: 3.1 mg/dL — ABNORMAL HIGH (ref 1.7–2.4)

## 2022-10-29 LAB — APTT: aPTT: 44 seconds — ABNORMAL HIGH (ref 24–36)

## 2022-10-29 LAB — PHOSPHORUS: Phosphorus: 2.8 mg/dL (ref 2.5–4.6)

## 2022-10-29 LAB — TRIGLYCERIDES: Triglycerides: 95 mg/dL (ref <150)

## 2022-10-29 LAB — HEPARIN LEVEL (UNFRACTIONATED): Heparin Unfractionated: >1.1 IU/mL — ABNORMAL HIGH (ref 0.30–0.70)

## 2022-10-29 MED ORDER — ACETAMINOPHEN 650 MG RE SUPP: 650.0000 mg | Freq: Four times a day (QID) | RECTAL | Status: DC | PRN

## 2022-10-29 MED ORDER — LORAZEPAM 2 MG/ML IJ SOLN: 1.0000 mg | INTRAMUSCULAR | Status: DC | PRN

## 2022-10-29 MED ORDER — HEPARIN (PORCINE) 25000 UT/250ML-% IV SOLN
700.0000 [IU]/h | INTRAVENOUS | Status: DC
  Administered 2022-10-29: 700 [IU]/h via INTRAVENOUS
  Filled 2022-10-29: qty 250

## 2022-10-29 MED ORDER — BIOTENE DRY MOUTH MT LIQD: 15.0000 mL | OROMUCOSAL | Status: DC | PRN

## 2022-10-29 MED ORDER — GLYCOPYRROLATE 0.2 MG/ML IJ SOLN: 0.2000 mg | INTRAMUSCULAR | Status: DC | PRN

## 2022-10-29 MED ORDER — HEPARIN BOLUS VIA INFUSION
2500.0000 [IU] | Freq: Once | INTRAVENOUS | Status: AC
  Administered 2022-10-29: 2500 [IU] via INTRAVENOUS
  Filled 2022-10-29: qty 2500

## 2022-10-29 MED ORDER — POLYVINYL ALCOHOL 1.4 % OP SOLN: 1.0000 [drp] | Freq: Four times a day (QID) | OPHTHALMIC | Status: DC | PRN

## 2022-10-29 MED ORDER — NOREPINEPHRINE 16 MG/250ML-% IV SOLN
0.0000 ug/min | INTRAVENOUS | Status: DC
  Administered 2022-10-29: 9 ug/min via INTRAVENOUS
  Filled 2022-10-29: qty 250

## 2022-10-29 MED ORDER — LORAZEPAM 2 MG/ML PO CONC
1.0000 mg | ORAL | Status: DC | PRN
  Filled 2022-10-29: qty 1

## 2022-10-29 MED ORDER — ALBUMIN HUMAN 25 % IV SOLN
25.0000 g | Freq: Once | INTRAVENOUS | Status: AC
  Administered 2022-10-29: 25 g via INTRAVENOUS
  Filled 2022-10-29: qty 100

## 2022-10-29 MED ORDER — ONDANSETRON HCL 4 MG/2ML IJ SOLN: 4.0000 mg | Freq: Four times a day (QID) | INTRAMUSCULAR | Status: DC | PRN

## 2022-10-29 MED ORDER — GLYCOPYRROLATE 1 MG PO TABS: 1.0000 mg | ORAL_TABLET | ORAL | Status: DC | PRN

## 2022-10-29 MED ORDER — SODIUM CHLORIDE 0.9 % IV SOLN
3.0000 g | Freq: Four times a day (QID) | INTRAVENOUS | Status: DC
  Administered 2022-10-29 (×2): 3 g via INTRAVENOUS
  Filled 2022-10-29 (×2): qty 3
  Filled 2022-10-29: qty 8

## 2022-10-29 MED ORDER — POTASSIUM CHLORIDE 10 MEQ/100ML IV SOLN
10.0000 meq | INTRAVENOUS | Status: DC
  Administered 2022-10-29: 10 meq via INTRAVENOUS
  Filled 2022-10-29 (×4): qty 100

## 2022-10-29 MED ORDER — CHLORHEXIDINE GLUCONATE CLOTH 2 % EX PADS
6.0000 | MEDICATED_PAD | Freq: Every day | CUTANEOUS | Status: DC
  Administered 2022-10-29: 6 via TOPICAL

## 2022-10-29 MED ORDER — ACETAMINOPHEN 325 MG PO TABS: 650.0000 mg | ORAL_TABLET | Freq: Four times a day (QID) | ORAL | Status: DC | PRN

## 2022-10-29 MED ORDER — POTASSIUM CHLORIDE 20 MEQ PO PACK
20.0000 meq | PACK | Freq: Once | ORAL | Status: AC
  Administered 2022-10-29: 20 meq
  Filled 2022-10-29: qty 1

## 2022-10-29 MED ORDER — HALOPERIDOL LACTATE 5 MG/ML IJ SOLN: 0.5000 mg | INTRAMUSCULAR | Status: DC | PRN

## 2022-10-29 MED ORDER — FUROSEMIDE 10 MG/ML IJ SOLN
40.0000 mg | Freq: Two times a day (BID) | INTRAMUSCULAR | Status: DC
  Administered 2022-10-29: 40 mg via INTRAVENOUS
  Filled 2022-10-29: qty 4

## 2022-10-29 MED ORDER — HALOPERIDOL LACTATE 2 MG/ML PO CONC: 0.5000 mg | ORAL | Status: DC | PRN

## 2022-10-29 MED ORDER — ONDANSETRON 4 MG PO TBDP: 4.0000 mg | ORAL_TABLET | Freq: Four times a day (QID) | ORAL | Status: DC | PRN

## 2022-10-29 MED ORDER — HALOPERIDOL 0.5 MG PO TABS: 0.5000 mg | ORAL_TABLET | ORAL | Status: DC | PRN

## 2022-10-29 MED ORDER — MIDAZOLAM HCL 2 MG/2ML IJ SOLN
1.0000 mg | INTRAMUSCULAR | Status: DC | PRN
  Administered 2022-10-29: 2 mg via INTRAVENOUS

## 2022-10-29 MED ORDER — ISOPROTERENOL HCL 0.2 MG/ML IJ SOLN: 1.0000 ug/min | INTRAVENOUS | Status: DC

## 2022-10-30 LAB — HEMOGLOBIN A1C
Hgb A1c MFr Bld: 5.2 % (ref 4.8–5.6)
Mean Plasma Glucose: 103 mg/dL

## 2022-10-30 LAB — LIPOPROTEIN A (LPA): Lipoprotein (a): 21.3 nmol/L (ref <75.0)

## 2022-11-10 ENCOUNTER — Other Ambulatory Visit: Payer: Medicare Other

## 2022-11-16 ENCOUNTER — Ambulatory Visit: Payer: Medicare Other | Admitting: Family Medicine

## 2022-11-17 ENCOUNTER — Encounter (HOSPITAL_COMMUNITY): Payer: Self-pay

## 2022-11-17 ENCOUNTER — Ambulatory Visit (HOSPITAL_COMMUNITY): Admit: 2022-11-17 | Payer: Medicare Other | Admitting: Cardiology

## 2022-11-17 SURGERY — ATRIAL FIBRILLATION ABLATION
Anesthesia: General

## 2022-11-18 NOTE — Progress Notes (Signed)
Suspected Aspiration Pneumonia Circulatory Shock Called bedside as patient became hemodynamically unstable with SBP remaining in the 50's-60's, sedation paused  & Levophed started with up-titration beyond peripheral parameters.TV pacer functioning well, HR stable, femoral site and back assessed- no signs of ecchymosis/ bleeding. Recent CXR: diffuse stable bilateral interstitial opacities, slightly more prominent within the LUL. - emergent CVC placement, obtain A-line as able - continue levophed drip, wean as tolerated to maintain MAP > 65 - STAT labs: CMP, INR, CBC, lactic, PCT, VBG - initiate aspiration coverage: Unasyn - f/u cultures - conservative IVF due to HFpEF exacerbation     Betsey Holiday, AGACNP-BC Acute Care Nurse Practitioner [facility]   [phone] / [phone] Please see Amion for pager details.

## 2022-11-18 NOTE — Progress Notes (Signed)
Pt extubated to room air with comfort measures only. Family at bedside. Pacemaker turned off.

## 2022-11-18 NOTE — Procedures (Addendum)
Central Venous Catheter Insertion Procedure Note  CARLIANA LEINWEBER  [id number]  1941-04-05  Date:11/15/2022  Time:12:50 AM   Provider Performing:Tanuj Mullens L [place]   Procedure: Insertion of Non-tunneled Central Venous Catheter(36556) with US guidance (76811)   Indication(s) Medication administration  Consent Unable to obtain consent due to emergent nature of procedure.  Anesthesia Topical only with 1% lidocaine , propofol & fentanyl drips running  Timeout Verified patient identification, verified procedure, site/side was marked, verified correct patient position, special equipment/implants available, medications/allergies/relevant history reviewed, required imaging and test results available.  Sterile Technique Maximal sterile technique including full sterile barrier drape, hand hygiene, sterile gown, sterile gloves, mask, hair covering, sterile ultrasound probe cover (if used).  Procedure Description Area of catheter insertion was cleaned with chlorhexidine and draped in sterile fashion.  With real-time ultrasound guidance a central venous catheter was placed into the left internal jugular vein. Nonpulsatile blood flow and easy flushing noted in all ports.  The catheter was sutured in place and sterile dressing applied.  Complications/Tolerance None; patient tolerated the procedure well. Chest X-ray is ordered to verify placement for internal jugular or subclavian cannulation.   Chest x-ray is not ordered for femoral cannulation.  EBL Minimal  Specimen(s) None   Betsey Holiday, AGACNP-BC Acute Care Nurse Practitioner [facility]   [phone] / [phone] Please see Amion for pager details.

## 2022-11-18 NOTE — Progress Notes (Signed)
Pt. Extubated to room air. 

## 2022-11-18 NOTE — Consult Note (Addendum)
Advanced Heart Failure Team Consult Note   Primary Physician: Glori LuisSonnenberg, Eric G, MD PCP-Cardiologist:  Sherryl MangesSteven Klein, MD  Reason for Consultation: CHF  HPI:    Dana MorinChristine S Harris is seen today for evaluation of CHF at the request of Dr. Mariah MillingGollan.   82 y.o. with history of persistent atrial fibrillation, HTN, ETOH, and chronic diastolic CHF.  She had atrial fibrillation ablation in 10/23.  She has had multiple cardioversions as wall and has been on amiodarone at home.  Redo ablation was planned but she ended up in a SNF after a fall in 1/24.  Prior echo in 2/24 showed EF 55-60%.  Apparently she is now living independently again.   She saw Dr. Graciela HusbandsKlein on 10/27/22, and plan was made for AV nodal ablation with permanent pacing given inability to control atrial fibrillation.  HR was 110s in AF on 4/9.  She had a severe HA and was sent to the ER, where head CT was unremarkable.  The next morning, she was sent back to ER by SNF due to seizure activity. In ER, she was noted to have runs of polymorphic VT wiith brief loss of consciousness and shaking.  She was cardioverted x 2 in the ER and had brief CPR.  She was intubated.  She was bradycardic when not in VT, EP saw and suspected polymorphic VT was due to profound bradycardia after converted out of atrial fibrillation.  Amiodarone was stopped.  She was taken to the cath lab and temporary transvenous pacer was placed.  Coronary angiography showed nonobstructive disease. Echo showed EF down to 35-40% with basal to mid septal and peri-apical severe hypokinesis and moderate RV dysfunction with dilated IVC.   Overnight, she developed hypotension and was started on NE, currently at 12.  Unable to place arterial line.  Unasyn started due to concern for aspiration pneumonitis.  Procalcitonin not significantly elevated. RSV noted on respiratory virus panel.  Lactate normal.  CVP is 12-13 this morning, on vent at FiO2 0.4.   Review of Systems: Unable to obtain,  intubated/sedated.   Home Medications Prior to Admission medications   Medication Sig Start Date End Date Taking? Authorizing Provider  acetaminophen (TYLENOL) 325 MG tablet Take 2 tablets (650 mg total) by mouth every 4 (four) hours as needed for headache or mild pain. 09/19/20   Swayze, Ava, DO  allopurinol (ZYLOPRIM) 100 MG tablet TAKE 1.5 TABLETS (150 MG TOTAL) BY MOUTH DAILY 08/07/22   Glori LuisSonnenberg, Eric G, MD  amiodarone (PACERONE) 200 MG tablet Take 1 tablet (200 mg total) by mouth 2 (two) times daily. 07/27/22   Fenton, Clint R, PA  amoxicillin-clavulanate (AUGMENTIN) 500-125 MG tablet Take 1 tablet by mouth in the morning and at bedtime. 10/27/22   Glori LuisSonnenberg, Eric G, MD  apixaban (ELIQUIS) 2.5 MG TABS tablet TAKE 1 TABLET BY MOUTH TWICE A DAY 05/13/22   Lanier PrudeLambert, Cameron T, MD  cholecalciferol (VITAMIN D) 25 MCG (1000 UNIT) tablet Take 2 tablets (2,000 Units total) by mouth daily. 09/20/20   Swayze, Ava, DO  gabapentin (NEURONTIN) 100 MG capsule Take 1 capsule (100 mg total) by mouth 3 (three) times daily. 08/26/22   Leeroy BockAnderson, Chelsey L, MD  LORazepam (ATIVAN) 0.5 MG tablet TAKE 1 TABLET BY MOUTH TWICE A DAY AS NEEDED FOR ANXIETY 10/23/22   Glori LuisSonnenberg, Eric G, MD  metoprolol tartrate (LOPRESSOR) 25 MG tablet Take 1 tablet (25 mg total) by mouth 3 (three) times daily. 10/27/22   Duke SalviaKlein, Steven C, MD  mirtazapine (  REMERON) 30 MG tablet Take 1 tablet (30 mg total) by mouth at bedtime. 06/09/22   Glori Luis, MD  Multiple Vitamin (MULTIVITAMIN) capsule Take 1 capsule by mouth daily.    [provider]  Multiple Vitamins-Minerals (PRESERVISION AREDS 2+MULTI VIT PO) Take 1 capsule by mouth in the morning and at bedtime.    [provider]  predniSONE (DELTASONE) 20 MG tablet Take 2 tablets (40 mg total) by mouth daily with breakfast. 10/27/22   Glori Luis, MD    Past Medical History: Past Medical History:  Diagnosis Date   (HFpEF) heart failure with preserved ejection fraction     a. 08/2017 Echo: EF 55-60%, no rwma, mild MR, mildly dil LA, nl RV fxn; b. 03/2020 Echo: EF 60-65%, no rwma, Gr2 DD. RVSP 44.73mmHg. Mod dil LA. Mild MR; c. 09/2020 Echo: EF 50-55%, no rwma, mild LVH, mod red RV fxn, mildly dily RA.   Acute CHF (congestive heart failure) 09/18/2020   Alcohol abuse 04/05/2020   Breast cancer 2001   left breast   Cancer 2001   left breast ca   Carotid arterial disease    a. 10/2004 s/p L CEA; b. 12/2015 Carotid U/S: RICA 1-39%; b. LICA patent CEA site.   Closed right hip fracture 09/20/2019   GERD (gastroesophageal reflux disease)    Hyperlipidemia    Hypertension    Osteoarthritis, multiple sites    Osteopenia    Persistent atrial fibrillation    a. Dx 08/2017; b. CHA2DS2VASc = 6-->Pradaxa; c. 09/2017 Successful DCCV (second shock - 200J); d. 10/2017 Recurrent Afib-->flecainide started 11/2017; e. 06/2020 s/p DCCV (200J x 1); f. 06/2020 Recurrent AF->Flec inc 75bid; g. 09/2020 WCT->flec d/c'd->amio started.   Personal history of radiation therapy 2001   left breast ca   Psoriasis    Wide-complex tachycardia    a. 09/2020 in setting of presumed Flecainide toxicity.  Flecainide d/c'd.    Past Surgical History: Past Surgical History:  Procedure Laterality Date   ABDOMINAL HYSTERECTOMY     ANKLE FRACTURE SURGERY  4/08   left---hardware still in place   ATRIAL FIBRILLATION ABLATION N/A 05/01/2022   Procedure: ATRIAL FIBRILLATION ABLATION;  Surgeon: Lanier Prude, MD;  Location: [facility];  Service: Cardiovascular;  Laterality: N/A;   BREAST BIOPSY Left 2001   breast ca   BREAST EXCISIONAL BIOPSY Left yrs ago   benign   BREAST LUMPECTOMY Left 2001   f/u radiation   CARDIOVERSION N/A 10/04/2017   Procedure: CARDIOVERSION;  Surgeon: Iran Ouch, MD;  Location: [facility];  Service: Cardiovascular;  Laterality: N/A;   CARDIOVERSION N/A 12/16/2017   Procedure: CARDIOVERSION;  Surgeon: Duke Salvia, MD;  Location: [facility];  Service: Cardiovascular;   Laterality: N/A;   CARDIOVERSION N/A 06/24/2020   Procedure: CARDIOVERSION;  Surgeon: Iran Ouch, MD;  Location: [facility];  Service: Cardiovascular;  Laterality: N/A;   CARDIOVERSION N/A 08/10/2022   Procedure: CARDIOVERSION;  Surgeon: Iran Ouch, MD;  Location: [facility];  Service: Cardiovascular;  Laterality: N/A;   CAROTID ENDARTERECTOMY Left 10/23/2004   CORONARY/GRAFT ACUTE MI REVASCULARIZATION N/A 11/16/2022   Procedure: Coronary/Graft Acute MI Revascularization;  Surgeon: Marcina Millard, MD;  Location: [facility];  Service: Cardiovascular;  Laterality: N/A;   FRACTURE SURGERY     LEFT HEART CATH AND CORONARY ANGIOGRAPHY N/A 11/05/2022   Procedure: LEFT HEART CATH AND CORONARY ANGIOGRAPHY;  Surgeon: Marcina Millard, MD;  Location: [facility];  Service: Cardiovascular;  Laterality: N/A;   OOPHORECTOMY     SHOULDER SURGERY  6/07   left   TEMPORARY PACEMAKER N/A 10/21/2022   Procedure: TEMPORARY PACEMAKER;  Surgeon: Marcina Millard, MD;  Location: [facility];  Service: Cardiovascular;  Laterality: N/A;   TONSILLECTOMY AND ADENOIDECTOMY     TOTAL HIP ARTHROPLASTY  2004   right    Family History: Family History  Problem Relation Age of Onset   Hypertension Brother    Pneumonia Mother    Leukemia Father    Heart disease Neg Hx    Diabetes Neg Hx    Breast cancer Neg Hx     Social History: Social History   Socioeconomic History   Marital status: Widowed    Spouse name: Not on file   Number of children: Not on file   Years of education: Not on file   Highest education level: Not on file  Occupational History   Occupation: Marketing    Comment: Retired  Tobacco Use   Smoking status: Former    Packs/day: 1.00    Years: 40.00    Additional pack years: 0.00    Total pack years: 40.00    Types: Cigarettes    Quit date: 07/21/1995    Years since quitting: 27.2   Smokeless tobacco: Never   Tobacco comments:    Former  smoker 05/15/22  Vaping Use   Vaping Use: Never used  Substance and Sexual Activity   Alcohol use: Yes    Alcohol/week: 14.0 standard drinks of alcohol    Types: 7 Glasses of wine, 7 Standard drinks or equivalent per week    Comment: 1 glass of wine and 1 drink of liquor daily 05/15/22   Drug use: No   Sexual activity: Never  Other Topics Concern   Not on file  Social History Narrative   1 natural child   3 adopted children   Artist---still teaches water colors   Husband has Alzheimers      Has living will   Daughter Amy is health care POA   DNR    No tube feeds if cognitively unaware   Social Determinants of Health   Financial Resource Strain: Low Risk  (02/13/2022)   Overall Financial Resource Strain (CARDIA)    Difficulty of Paying Living Expenses: Not hard at all  Food Insecurity: No Food Insecurity (02/13/2022)   Hunger Vital Sign    Worried About Running Out of Food in the Last Year: Never true    Ran Out of Food in the Last Year: Never true  Transportation Needs: No Transportation Needs (02/13/2022)   PRAPARE - Administrator, Civil Service (Medical): No    Lack of Transportation (Non-Medical): No  Physical Activity: Unknown (02/10/2021)   Exercise Vital Sign    Days of Exercise per Week: 0 days    Minutes of Exercise per Session: Not on file  Stress: No Stress Concern Present (02/13/2022)   [employer] of Occupational Health - Occupational Stress Questionnaire    Feeling of Stress : Not at all  Social Connections: Unknown (02/13/2022)   Social Connection and Isolation Panel [NHANES]    Frequency of Communication with Friends and Family: More than three times a week    Frequency of Social Gatherings with Friends and Family: More than three times a week    Attends Religious Services: Not on file    Active Member of Clubs or Organizations: Not on file    Attends Banker  Meetings: Not on file    Marital Status: Not on file    Allergies:   No Known Allergies  Objective:    Vital Signs:   Temp:  [96.6 F (35.9 C)-99 F (37.2 C)] 98.2 F (36.8 C) 2022/11/08 0740) Pulse Rate:  [25-107] 59 11/08/22 0740) Resp:  [15-38] 20 2022-11-08 0740) BP: (56-149)/(47-108) 99/69 11-08-2022 0740) SpO2:  [70 %-100 %] 97 % 11-08-2022 0757) FiO2 (%):  [28 %-60 %] 28 % 11/08/22 0757) Weight:  [47.2 kg] 47.2 kg (04/10 1525)    Weight change: Filed Weights   10/24/2022 1525  Weight: 47.2 kg    Intake/Output:   Intake/Output Summary (Last 24 hours) at Nov 08, 2022 0850 Last data filed at 11/08/2022 0653 Gross per 24 hour  Intake 1209.03 ml  Output 705 ml  Net 504.03 ml      Physical Exam    General:  Intubated/sedated.  HEENT: normal Neck: supple. JVP 12-14 cm. Carotids 2+ bilat; no bruits. No lymphadenopathy or thyromegaly appreciated. Cor: PMI nondisplaced. Regular rate & rhythm. No rubs, gallops or murmurs. Lungs: Decreased BS dependently.  Abdomen: soft, nontender, nondistended. No hepatosplenomegaly. No bruits or masses. Good bowel sounds. Extremities: no cyanosis, clubbing, rash, edema Neuro: Sedated on vent.    Telemetry   V-paced at 60 with underlying junctional rhythm. Personally reviewed.   EKG    ?Junctional rhythm rate 42 (personally reviewed)  Labs   Basic Metabolic Panel: Recent Labs  Lab 11/01/2022 1531 11/10/2022 1826 November 08, 2022 0115  NA 133*  --  138  137  K 3.3*  --  3.8  3.7  CL 107  --  106  106  CO2 15*  --  24  23  GLUCOSE 466*  --  99  97  BUN 19  --  21  22  CREATININE 0.87  --  0.87  0.83  CALCIUM 6.1*  --  8.8*  8.8*  MG  --  1.5* 3.1*  PHOS 2.8  --  2.8  2.8    Liver Function Tests: Recent Labs  Lab 11/13/2022 1826 November 08, 2022 0115  AST 122* 75*  ALT 65* 54*  ALKPHOS 314* 279*  BILITOT 1.0 0.6  PROT 6.4* 5.7*  ALBUMIN 3.0* 2.7*  2.7*   No results for input(s): "LIPASE", "AMYLASE" in the last 168 hours. No results for input(s): "AMMONIA" in the last 168 hours.  CBC: Recent Labs  Lab  11/02/2022 1531 11/08/2022 0115  WBC 5.4 10.5  HGB 11.2* 10.1*  HCT 34.9* 30.5*  MCV 102.6* 99.0  PLT 314 323    Cardiac Enzymes: No results for input(s): "CKTOTAL", "CKMB", "CKMBINDEX", "TROPONINI" in the last 168 hours.  BNP: BNP (last 3 results) Recent Labs    08/14/22 0231 08/18/22 0434 10/22/2022 1850  BNP 121.7* 522.1* 1,101.6*    ProBNP (last 3 results) Recent Labs    02/27/22 1106  PROBNP 420.0*     CBG: Recent Labs  Lab 11/11/2022 1937 10/27/2022 2201 11/01/2022 2315 November 08, 2022 0309 11-08-22 0720  GLUCAP 232* 145* 115* 80 92    Coagulation Studies: Recent Labs    11-08-22 0115  LABPROT 16.7*  INR 1.4*     Imaging   ECHOCARDIOGRAM COMPLETE  Result Date: November 08, 2022    ECHOCARDIOGRAM REPORT   Patient Name:   [name] Date of Exam: 10/27/2022 Medical Rec #:  [id number]          Height:       67.0 in Accession #:    [id number]  Weight:       104.0 lb Date of Birth:  1940-11-22          BSA:          1.532 m Patient Age:    30 years           BP:           87/64 mmHg Patient Gender: F                  HR:           60 bpm. Exam Location:  [facility] Procedure: 2D Echo, Cardiac Doppler and Color Doppler Indications:     I47.2 Ventricular Tachycardia.  History:         Patient has prior history of Echocardiogram examinations, most                  recent 08/21/2022. CHF, Arrythmias:Atrial Fibrillation; Risk                  Factors:Hypertension and Dyslipidemia.  Sonographer:     Daphine Deutscher RDCS Referring Phys:  [id number] Francee Nodal FURTH Diagnosing Phys: Julien Nordmann MD IMPRESSIONS  1. Left ventricular ejection fraction, by estimation, is 35 to 40%. The left ventricle has moderately decreased function. The left ventricle demonstrates mild global hypokinesis with severe hypokinesis of the anterior/anteroseptal regions. There is mild  left ventricular hypertrophy.  2. Right ventricular systolic function is moderately reduced. The right ventricular size is  normal. There is normal pulmonary artery systolic pressure. The estimated right ventricular systolic pressure is 20.2 mmHg.  3. Left atrial size was mild to moderately dilated.  4. The mitral valve is normal in structure. Mild mitral valve regurgitation. No evidence of mitral stenosis.  5. Tricuspid valve regurgitation is mild to moderate.  6. The aortic valve is tricuspid. Aortic valve regurgitation is not visualized. Aortic valve sclerosis is present, with no evidence of aortic valve stenosis.  7. The inferior vena cava is dilated in size with <50% respiratory variability, suggesting right atrial pressure of 15 mmHg. FINDINGS  Left Ventricle: Left ventricular ejection fraction, by estimation, is 35 to 40%. The left ventricle has moderately decreased function. The left ventricle demonstrates global hypokinesis. The left ventricular internal cavity size was normal in size. There is mild left ventricular hypertrophy. Left ventricular diastolic parameters are indeterminate. Right Ventricle: The right ventricular size is normal. No increase in right ventricular wall thickness. Right ventricular systolic function is moderately reduced. There is normal pulmonary artery systolic pressure. The tricuspid regurgitant velocity is 1.95 m/s, and with an assumed right atrial pressure of 5 mmHg, the estimated right ventricular systolic pressure is 20.2 mmHg. Left Atrium: Left atrial size was mild to moderately dilated. Right Atrium: Right atrial size was normal in size. Pericardium: There is no evidence of pericardial effusion. Mitral Valve: The mitral valve is normal in structure. There is mild thickening of the mitral valve leaflet(s). There is mild calcification of the mitral valve leaflet(s). Mild mitral valve regurgitation. No evidence of mitral valve stenosis. Tricuspid Valve: The tricuspid valve is normal in structure. Tricuspid valve regurgitation is mild to moderate. No evidence of tricuspid stenosis. Aortic Valve: The  aortic valve is tricuspid. Aortic valve regurgitation is not visualized. Aortic valve sclerosis is present, with no evidence of aortic valve stenosis. Pulmonic Valve: The pulmonic valve was normal in structure. Pulmonic valve regurgitation is mild. No evidence of pulmonic stenosis. Aorta: The aortic root is normal in size and structure.  Venous: The inferior vena cava is dilated in size with less than 50% respiratory variability, suggesting right atrial pressure of 15 mmHg. IAS/Shunts: No atrial level shunt detected by color flow Doppler.  LEFT VENTRICLE PLAX 2D LVIDd:         3.80 cm LVIDs:         2.60 cm LV PW:         1.20 cm LV IVS:        1.00 cm LVOT diam:     1.90 cm LV SV:         31 LV SV Index:   20 LVOT Area:     2.84 cm  RIGHT VENTRICLE            IVC RV Basal diam:  3.60 cm    IVC diam: 2.30 cm RV S prime:     6.56 cm/s TAPSE (M-mode): 1.1 cm LEFT ATRIUM             Index        RIGHT ATRIUM           Index LA diam:        3.90 cm 2.55 cm/m   RA Area:     12.80 cm LA Vol (A2C):   63.5 ml 41.46 ml/m  RA Volume:   34.30 ml  22.40 ml/m LA Vol (A4C):   56.9 ml 37.15 ml/m LA Biplane Vol: 60.8 ml 39.70 ml/m  AORTIC VALVE LVOT Vmax:   54.50 cm/s LVOT Vmean:  32.700 cm/s LVOT VTI:    0.109 m  AORTA Ao Root diam: 3.10 cm Ao Asc diam:  3.40 cm MV E velocity: 61.83 cm/s  TRICUSPID VALVE                            TR Peak grad:   15.2 mmHg                            TR Vmax:        195.00 cm/s                             SHUNTS                            Systemic VTI:  0.11 m                            Systemic Diam: 1.90 cm Julien Nordmann MD Electronically signed by Julien Nordmann MD Signature Date/Time: 10/30/2022/7:26:21 AM    Final    DG Chest Port 1 View  Result Date: 10/25/2022 CLINICAL DATA:  Status post central line placement EXAM: PORTABLE CHEST 1 VIEW COMPARISON:  Film from the previous day. FINDINGS: Endotracheal tube and gastric catheter are seen and stable. Transvenous pacing wire is again  noted and stable. New left jugular central line is seen with the catheter tip at the cavoatrial junction. No pneumothorax is noted. Diffuse interstitial changes are again identified. IMPRESSION: No pneumothorax following central line placement. The remainder of the exam is stable. Electronically Signed   By: Alcide Clever M.D.   On: 10/19/2022 01:16   DG Chest Port 1 View  Result Date: 11/15/2022 CLINICAL DATA:  Evaluate nasogastric tube. EXAM: PORTABLE CHEST 1 VIEW COMPARISON:  October 28, 2022 (6:19 p.m.) FINDINGS: There is stable endotracheal tube and ascending pacing lead wire positioning. A nasogastric tube is seen with its distal end looped within the body of the stomach. The heart size and mediastinal contours are within normal limits. Diffuse bilateral interstitial opacities are again seen, slightly more prominent within the left upper lobe. There is no evidence of a pleural effusion or pneumothorax. The visualized skeletal structures are unremarkable. IMPRESSION: 1. Nasogastric tube positioning, as described above. 2. Stable, diffuse bilateral interstitial opacities. Electronically Signed   By: Aram Candela M.D.   On: 11/15/2022 21:10   DG Chest Port 1 View  Result Date: 10/19/2022 CLINICAL DATA:  Intubated EXAM: PORTABLE CHEST 1 VIEW COMPARISON:  10/19/2022 FINDINGS: Endotracheal tube tip is about 2 cm superior to carina. Esophageal tube tip at the level of diaphragm, side-port at the level of distal esophagus. Borderline cardiomegaly. Ascending pacing lead with tip over the RV. Interval left greater than right interstitial and ground-glass opacity. No pleural effusion or pneumothorax. IMPRESSION: 1. Endotracheal tube tip about 2 cm superior to carina. 2. Esophageal tube tip at the level of diaphragm, side-port at the level of distal esophagus. Further advancement is recommended for more optimal positioning. 3. Interval left greater than right interstitial and ground-glass opacity, suspect for  edema. These results will be called to the ordering clinician or representative by the Radiologist Assistant, and communication documented in the PACS or [employer]. Electronically Signed   By: Jasmine Pang M.D.   On: 11/06/2022 18:35   CT HEAD WO CONTRAST ( )  Result Date: 11/01/2022 CLINICAL DATA:  Syncope/presyncope. Cerebrovascular cause suspected. Seizure like activity. EXAM: CT HEAD WITHOUT CONTRAST TECHNIQUE: Contiguous axial images were obtained from the base of the skull through the vertex without intravenous contrast. RADIATION DOSE REDUCTION: This exam was performed according to the departmental dose-optimization program which includes automated exposure control, adjustment of the mA and/or kV according to patient size and/or use of iterative reconstruction technique. COMPARISON:  10/27/2022 FINDINGS: Brain: Intravascular contrast is present presumably due to some other prior procedure. No focal abnormality affects the brainstem or cerebellum. Cerebral hemispheres show atrophy and chronic small-vessel ischemic change of the white matter but no acute stroke, mass, hemorrhage, hydrocephalus or extra-axial collection. No abnormal enhancement occurs. Vascular: Major vessels at the base of the brain show flow. Skull: Negative Sinuses/Orbits: Sinus inflammatory changes as noted on yesterday's exam. Other: None IMPRESSION: No acute CT finding. Atrophy and chronic small-vessel ischemic change of the white matter. Intravascular contrast is present from some other procedure and [place] this examination serves as a postcontrast head CT. Electronically Signed   By: Paulina Fusi M.D.   On: 10/23/2022 18:11   CARDIAC CATHETERIZATION  Result Date: 11/10/2022   Mid LM to Dist LM lesion is 20% stenosed.   Prox LAD to Mid LAD lesion is 50% stenosed.   Dist LAD lesion is 60% stenosed.   Prox RCA lesion is 40% stenosed.   There is mild left ventricular systolic dysfunction.   The left ventricular ejection  fraction is 35-45% by visual estimate. 1.  Nonobstructive coronary artery disease 2.  Mildly reduced left ventricular function with estimated LVEF 40% with anterior apical hypokinesis.  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 3.  Sinus bradycardia, junctional bradycardia, status post temporary pacemaker via right femoral vein 4.  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  Medical therapy 2.  DC amiodarone infusion 3.  Temporary pacemaker at at 60 bpm  4.  EP evaluation in a.m. 5.  2D echocardiogram in a.m.     Medications:     Current Medications:  Chlorhexidine Gluconate Cloth  6 each Topical Daily   docusate  100 mg Per Tube BID   enoxaparin (LOVENOX) injection  40 mg Subcutaneous Q24H   furosemide  40 mg Intravenous BID   insulin aspart  0-20 Units Subcutaneous Q4H   pantoprazole (PROTONIX) IV  40 mg Intravenous Q24H   polyethylene glycol  17 g Per Tube Daily   sodium chloride flush  3 mL Intravenous Q12H    Infusions:  sodium chloride     ampicillin-sulbactam (UNASYN) IV 200 mL/hr at 11/20/2022 0653   fentaNYL infusion INTRAVENOUS 100 mcg/hr (11/20/22 0653)   norepinephrine (LEVOPHED) Adult infusion 12 mcg/min (Nov 20, 2022 0653)   potassium chloride     propofol (DIPRIVAN) infusion 25 mcg/kg/min (Nov 20, 2022 0653)      Assessment/Plan   1. Polymorphic VT/prolonged QT interval: It appears that she converted from atrial fibrillation (was in AF with RVR on 4/9) to sinus brady/junctional brady with prolonged QT interval and developed episodes of bradycardia-mediated PMVT.  She is now paced at 60 bpm with no further runs of PMVT overnight.  Underlying rhythm appears to be profound junctional bradycardia when pacing rate was transiently decreased today.  Cause of markedly prolonged QT is uncertain as it has been normal in the past, ?related to Takotsubo event.  - Avoid amiodarone with PMVT to avoid QT  prolongation.  - Replace K and Mg aggressively (Mg high today, needs supplemental K => ordered). - She will ultimately need PPM, prefer left bundle lead, and will need AV nodal ablation as planned prior.  - Discussed with Dr. Graciela Husbands, will try isoproterenol to keep HR elevated enough to avoid RV pacing.  2. Atrial fibrillation: Persistent, has been difficult to control.  Was on amiodarone at home but not holding NSR (AF/RVR on 4/9 ECG).   Had ablation in 10/23.  Plan per Dr. Graciela Husbands had been AV nodal ablation with PPM. Underlying rhythm today appears junctional brady.  - When more stable and extubated, will need AVN ablation and PPM.  - Can restart heparin gtt today, stop Lovenox.  3. Acute systolic CHF: Echo in 2/24 with EF 55-60%.  Echo this admission with EF down to 35-40% with mid to apical septal and peri-apical severe hypokinesis and moderate RV dysfunction with dilated IVC. Cath showed nonobstructive CAD.  Suspect that this is a Takotsubo/stress cardiomyopathy, ?due to severe headache versus emotional stress event (?death of relatives) versus due to stunning from VT arrest itself.  Currently sedated on on NE 11. CVP 12-13.  - Send co-ox.  - Wean norepinephrine as able.  - Will add isoproterenol to aim for HR 70-80 bpm to see if we can avoid dyssynchrony from RV pacing.  - Lasix 40 mg IV bid x 2 doses and replace K.  4. Acute hypoxemic respiratory failure: Suspect pulmonary edema based on CXR and elevated CVP, also possible aspiration pneumonitis.  - Diuresis as above.  - Covering with Unasyn.  - SBT this morning, extubate when able.   Length of Stay: 1  Marca Ancona, MD  20-Nov-2022, 8:50 AM  Advanced Heart Failure Team Pager [phone] (M-F; 7a - 5p)  Please contact [facility] for night-coverage after hours (4p -7a ) and weekends on [email or web address]

## 2022-11-18 NOTE — Progress Notes (Signed)
Pharmacy Antibiotic Note  JAHAN LEATH is a 82 y.o. female admitted on 10/20/2022 with  aspiration PNA .  Pharmacy has been consulted for Unasyn dosing.  Plan: Unasyn 3 gm IV Q6H ordered to start on 4/11 @ 0030.   Height: 5\' 7"  (170.2 cm) Weight: 47.2 kg (104 lb) IBW/kg (Calculated) : 61.6  Temp (24hrs), Avg:97.2 F (36.2 C), Min:96.6 F (35.9 C), Max:98.5 F (36.9 C)  Recent Labs  Lab 10/22/2022 1531  WBC 5.4  CREATININE 0.87    Estimated Creatinine Clearance: 36.5 mL/min (by C-G formula based on SCr of 0.87 mg/dL).    No Known Allergies  Antimicrobials this admission:   >>    >>   Dose adjustments this admission:   Microbiology results:  BCx:   UCx:    Sputum:    MRSA PCR:   Thank you for allowing pharmacy to be a part of this patient's care.  Tatem Fesler D 11/17/2022 12:13 AM

## 2022-11-18 NOTE — Hospital Course (Signed)
32  35 1101.6 

## 2022-11-18 NOTE — Progress Notes (Addendum)
Patient Name: [name] Date of Encounter: 10/20/2022  Primary Cardiologist: Sherryl Manges, MD Electrophysiologist: Lanier Prude, MD  Interval Summary   S/p temp pacing wire yesterday late afternoon S/p LHC Remains intubated, sedated  Family at bedside CCM planning to attempt to wean sedation, extubate today  Inpatient Medications    Scheduled Meds:  docusate  100 mg Per Tube BID   enoxaparin (LOVENOX) injection  40 mg Subcutaneous Q24H   insulin aspart  0-20 Units Subcutaneous Q4H   pantoprazole (PROTONIX) IV  40 mg Intravenous Q24H   polyethylene glycol  17 g Per Tube Daily   sodium chloride flush  3 mL Intravenous Q12H   Continuous Infusions:  sodium chloride     ampicillin-sulbactam (UNASYN) IV 200 mL/hr at 10/27/2022 0653   fentaNYL infusion INTRAVENOUS 100 mcg/hr (11/01/2022 0653)   norepinephrine (LEVOPHED) Adult infusion 12 mcg/min (11/06/2022 0653)   propofol (DIPRIVAN) infusion 25 mcg/kg/min (11/08/2022 0653)   PRN Meds: sodium chloride, acetaminophen, fentaNYL, midazolam, ondansetron (ZOFRAN) IV, sodium chloride flush   Vital Signs    Vitals:   10/21/2022 0523 11/08/2022 0540 11/15/2022 0610 11/15/2022 0640  BP: 99/69 100/70 103/72 106/73  Pulse: 60 60 60 60  Resp: 20 20 20 20   Temp: 98.8 F (37.1 C) 98.6 F (37 C) 98.6 F (37 C) 98.4 F (36.9 C)  TempSrc:  Bladder Bladder   SpO2: 97% 97% 97% 97%  Weight:      Height:        Intake/Output Summary (Last 24 hours) at 11/17/2022 0726 Last data filed at 10/25/2022 3500 Gross per 24 hour  Intake 1209.03 ml  Output 705 ml  Net 504.03 ml   Filed Weights   11/16/2022 1525  Weight: 47.2 kg    Physical Exam    GEN- The patient is intubated, sedated   Lungs- Clear to ausculation bilaterally, normal work of breathing Cardiac- Regular rate and rhythm, no murmurs, rubs or gallops; unable to palpate radial pulses GI- soft, NT, ND, + BS Extremities- cold bilat hands, warm lower extremities  Telemetry     V-paced at 60 Brief NSR period around 0400am, with prolonged QT No further VT (personally reviewed)  Hospital Course    [name] is a 82 y.o. female with PMH persistent Afib s/p ablation, alcohol use/dependence, HTN, chronic diastolic HF.   S/p AF ablation October 2023 during which the pulmonary veins and posterior wall were isolated.  Unfortunately, she had recurrence of atrial fibrillation.  She has had multiple prior cardioversions as well.  After her recent recurrences of atrial fibrillation, amiodarone was started.  She was taking a stable dose of oral amiodarone in the outpatient setting. Was planned to repeat ablation, but patient had worsening overall health status, so instead planned to pursue PPM and AVN ablation.   She was seen in clinic 10/27/22 by Dr. Graciela Husbands with "worst headache of life" x 3 days, she was sent for a CT scan which did not show any evidence of hemorrhage. She went home. She presented back to the emergency department with seizure-like activity. EMS was called and found her to have salvos of polymorphic VT. During the episodes of polymorphic VT, she would lose consciousness and shake. In the emergency department she was cardioverted at least twice and required brief CPR. She was intubated and sedated. She was noted to be bradycardic with salvos of nonsustained and sustained ventricular tachycardia.   She underwent urgent temp transvenous pacing wire and LHC, as well as repeat  CT scan to eval for possible evolving intracranial hemorrhage.  No further PMVT   No notes on file  Assessment & Plan    #) persistent Afib #) PMVT #) acute on chronic HFpEF #) possible takutsobo S/p temp pacing wire, pacing at 60 Underlying rhythm: junctional in 30s LHC showed non-obs CAD, mild reduced EF, possible takotsubo's New, reduced EF by echo Will initiate isuprel to attempt to minimize RV pacing and increase HR > shorten QT Avoid QT prolonging medications Keep K > 4,  Mag > 2 Home eliquis being held, hep gtt - appreciate pharm's assistance with hep mgmt  Appreciate HF team input  Family at bedside updated on guarded prognosis  #) seizure-like activity #) RSV infection #) potential aspiration All managed by CCM      For questions or updates, please contact [facility] Please consult [email or web address] for contact info under Cardiology/STEMI.  Signed, Sherie Don, NP  11/10/2022, 7:26 AM   As above.  Met with the family member, female, but remember to his son or son-in-law but he knew my name, reviewed prognosis.  It was still unclear as to what had caused the profound QT prolongation to emerge overnight but suspect it is a here to date unspecified neurological event.  Could have been cardiac ischemic event as others have suggested as a Tako-Tsubo for now we will continue supportive care

## 2022-11-18 NOTE — Progress Notes (Addendum)
2000 notified Cheryll Cockayne that OGT, that pt had upon admission to ICU, needed to be advanced per recommendation of CXR. Cheryll Cockayne reviewed imaging and recommended advancement of 8-10cm, but due to current tube size/length, staff unable to do so.  Per verbal order from NP Cheryll Cockayne, RN removed current OGT and replaced with new one. OGT secured at 80cm at lip and had bile looking return. New order for CXR to verify placement.

## 2022-11-18 NOTE — Progress Notes (Signed)
NAME:  [name], MRN:  [id number], DOB:  08/04/1940, LOS: 1 ADMISSION DATE:  10/26/2022, CONSULTATION DATE:  11/15/2022 REFERRING MD:  Dr. Okey DupreEnd, CHIEF COMPLAINT:  Seizure-like activity and syncope    History of Present Illness:  Dana MorinChristine S Harris is a 82 y.o. 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  EMS noted her to be in V. Tach of which she was given Amiodarone.  This was followed by brief episode of seizure like activity (tremor of all extremities) and unconsciousness.  She then awoke and was not postictal.  Upon arrival to the ED she was bradycardic (A. Fib), awake and alert, and normotensive.  She denied chest pain, but did report some generalized body aches/soreness.  ED Course: Initial Vital Signs: Temperature , Pulse 51, BP 123/65, SpO2 99% Significant Labs: Sodium 133, potassium 3.3, bicarb 15, glucose 466, calcium 6.1, anion gap 11, high-sensitivity troponin 32, WBC 5.4, hemoglobin 11.2 Imaging Chest X-ray>>IMPRESSION: 1. Endotracheal tube tip about 2 cm superior to carina. 2. Esophageal tube tip at the level of diaphragm, side-port at the level of distal esophagus. Further advancement is recommended for more optimal positioning. 3. Interval left greater than right interstitial and ground-glass opacity, suspect for edema.  While in the ED she had several runs of V. Tach, at one point losing consciousness.  She was placed Amiodarone infusion, and code STEMI was called for evaluation of temporary pacer.  Later she did briefly lost pulses for about 20 seconds of which received CPR and defibrillation x2 with return of pulses. Decision was made to intubate, and to take her emergently  to the Cath Lab for emergent transvenous pacemaker placement.  She returns to ICU post Cath and remains intubated.  PCCM consulted for further workup and treatment, and management of vent.  Please see "Significant Hospital Events" section below for full detailed hospital course.   Pertinent  Medical History   Past Medical History:  Diagnosis Date   (HFpEF) heart failure with preserved ejection fraction    a. 08/2017 Echo: EF 55-60%, no rwma, mild MR, mildly dil LA, nl RV fxn; b. 03/2020 Echo: EF 60-65%, no rwma, Gr2 DD. RVSP 44.607mmHg. Mod dil LA. Mild MR; c. 09/2020 Echo: EF 50-55%, no rwma, mild LVH, mod red RV fxn, mildly dily RA.   Acute CHF (congestive heart failure) 09/18/2020   Alcohol abuse 04/05/2020   Breast cancer 2001   left breast   Cancer 2001   left breast ca   Carotid arterial disease    a. 10/2004 s/p L CEA; b. 12/2015 Carotid U/S: RICA 1-39%; b. LICA patent CEA site.   Closed right hip fracture 09/20/2019   GERD (gastroesophageal reflux disease)    Hyperlipidemia    Hypertension    Osteoarthritis, multiple sites    Osteopenia    Persistent atrial fibrillation    a. Dx 08/2017; b. CHA2DS2VASc = 6-->Pradaxa; c. 09/2017 Successful DCCV (second shock - 200J); d. 10/2017 Recurrent Afib-->flecainide started 11/2017; e. 06/2020 s/p DCCV (200J x 1); f. 06/2020 Recurrent AF->Flec inc 75bid; g. 09/2020 WCT->flec d/c'd->amio started.   Personal history of radiation therapy 2001   left breast ca   Psoriasis    Wide-complex tachycardia    a. 09/2020  in setting of presumed Flecainide toxicity.  Flecainide d/c'd.    Micro Data:  2022/11/19: COVID-19 PCR>> negative 4/10: RVP>> + RSV 4/11: Tracheal aspirate>>  Antimicrobials:   Anti-infectives (From admission, onward)    Start     Dose/Rate Route Frequency Ordered Stop   10/21/2022 0100  Ampicillin-Sulbactam (UNASYN) 3 g in sodium chloride 0.9 % 100 mL IVPB        3 g 200 mL/hr over 30 Minutes Intravenous Every 6 hours 10/26/2022 0011           Significant Hospital Events: Including procedures, antibiotic start and stop dates in addition to other pertinent events   11-19-2022: Presented to ED with seizure like activity and syncope.  Found to be in V. Tach, briefly lost pulses for 20 seconds requiring CPR and defibrillation x2.  Cardiology took emergently to Cath Lab for [facility] and pacer placement.  Returns to ICU and remains intubated.  PCCM consulted. 4/11: Remains on vent, minimal settings, will trial SBT as tolerated.  Requiring Levophed.  + for RSV.  Echocardiogram with new decrease in LVEF to 35-40% and moderate reduction in RV function, advanced CHF consulted, concern for Cardiogenic shock. Starting Heparin for anticoagulation.  RUQ [place] is pending for mild transaminitis. Palliative Care consulted for GOC.  Interim History / Subjective:  -Last night became hypotensive requiring Levophed and placement of central line ~ will check CVP and Coox as Echo from last night with new decrease in LVEF to 35-40% and mild global hypokinesis, along with moderately reduced RV systolic function ~ Advanced CHF consulted -ScvO2 came back at  29,  concerning for Cardiogenic shock -RVP came back + for RSV -Concern for possible aspiration on CXR ~ was empirically placed on Unasyn ~ will obtain Tracheal aspirate as able -Remains intubated, vent settings: 40% FiO2 & 5 PEEP -Kidney function remains normal, UOP 700 cc last 24 hrs (net + 500 cc) -Electrolyte derangements resolved following replacement last night -LFT's remain mildly elevated but trending down ~ RUQ [place] is pending -Plan to start Heparin gtt per Cardiology for anticoagulation -Palliative Care consulted for GOC (pt had previously signed MOST form in February of this year)  Objective   Blood pressure 106/73, pulse 60, temperature 98.4 F (36.9 C), resp. rate 20, height 5\' 7"  (1.702 m), weight 47.2 kg, SpO2 97 %.    Vent Mode: PRVC FiO2 (%):  [40 %-60 %] 40 % Set Rate:  [20 bmp] 20 bmp Vt Set:   [8 mL-400 mL] 400 mL PEEP:  [8 cmH20] 8 cmH20 Plateau Pressure:  [19 cmH20] 19 cmH20   Intake/Output Summary (Last 24 hours) at 11/01/2022 9629 Last data filed at 11/16/2022 5284 Gross per 24 hour  Intake 1209.03 ml  Output 705 ml  Net 504.03 ml   Filed Weights   11-19-22 1525  Weight: 47.2 kg    Examination: General: Acute on chronically ill-appearing frail elderly female, laying in bed, intubated sedated, no acute distress HENT: Atraumatic, normocephalic, neck supple, no JVD Lungs: Coarse breath sounds throughout, even, nonlabored, synchronous with the vent Cardiovascular: Paced rhythm, no murmurs, rubs, gallops Abdomen: Soft, nontender, nondistended, no guarding or rebound tenderness, bowel sounds positive x 4 Extremities: Extremities are cool, normal bulk and tone, no deformities, chronic changes to the bilateral lower extremities Neuro: Sedated, withdraws from pain, pupils PERRLA, currently not following commands GU: Foley catheter in place draining yellow urine  Resolved Hospital Problem list     Assessment & Plan:   #Shock: Cardiogenic +/- Septic +/-  sedation related #Sustained V. Tach (with brief loss of pulses, approximately 20 seconds) #Atrial Fibrillation with slow ventricular rates s/p Temporary Transvenous Pacemaker Placement on 11/06/2022 #Acute on Chronic HFpEF (LVEF 55-60 in Feb. 2024), now with new HFrEF (LVEF 35-40% and moderately reduced RV function), ? Takotsubo/stress Cardiomyopathy #Mildly Elevated Troponin, suspect demand ischemia PMHx: Persistent A.fib on Eliquis, HTN Echocardiogram 11/07/2022: LVEF 35 to 40%, mild LVH and mild global hypokinesis and severe hypokinesis of the anterior/anteroseptal regions, RV function moderately reduced, mild MR, mild to moderate TR -Continuous cardiac monitoring -Maintain MAP >65 -Vasopressors as needed to maintain MAP goal -Lactic acid has normalized -Trend HS Troponin until peaked (32 ~ 35 ~) -TSH normal at 2.4 -Given  vasopressor requirements with new reduction in LV and RV function, will check Coox panel and CVP ~ ScvO2 is 29 consistent with Cardiogenic shock ~ given recent V.Tach would be hesitant to start inotropes ~ will defer to Cardiology -Diuresis as BP and renal function permits as per Advanced CHF (plan for Lasix 40 mg BID x2 doses) -Cardiology, EP, and Advanced Heart Failure following, appreciate input  -Start Heparin gtt for anticoagulation  #Acute Hypoxic Respiratory Failure due to hemodynamic instability & brief cardiac arrest requiring multiple defibrillations, along with developing pulmonary edema #Concern for possible aspiration -Full vent support, implement lung protective strategies -Plateau pressures less than 30 cm H20 -Wean FiO2 & PEEP as tolerated to maintain O2 sats >92% -Follow intermittent Chest X-ray & ABG as needed -Spontaneous Breathing Trials when respiratory parameters met and mental status permits -Implement VAP Bundle -Prn Bronchodilators -ABX as above -Diuresis as BP and renal function permits   #RSV infection #Concern for potential Aspiration -Monitor fever curve -Trend WBC's & Procalcitonin -Follow cultures as above -Continue empiric Unasyn pending cultures & sensitivities  #Hypocalcemia ~ IMPROVED #Mild Hypokalemia ~ RESOLVED #Mild Hyponatremia  ~ RESOLVED #AG Metabolic Acidosis ~ RESOLVED -Monitor I&O's / urinary output -Follow BMP -Ensure adequate renal perfusion -Avoid nephrotoxic agents as able -Replace electrolytes as indicated, Pharmacy consulted to assist with electrolyte replacement  #Mild Transaminitis ~ IMPROVING -Trend LFT's and coags -RUQ Ultrasound pending  #Severe Hyperglycemia, rule out DKA vs HHS ~ IMPROVED  Hgb A1c on admission is 5.3 -CBG's q4h; Target range of 140 to 180 -SSI  -Follow ICU Hypo/Hyperglycemia protocol -Check Beta-hydroxybutyric acid and serum osmolality  #Seizure-like activity #Headache (report of worst HA of her  life on 4/9, outpatient CT Head negative on 4/9) #Sedation needs in the setting of mechanical ventilation PMHx: ETOH abuse -Maintain a RASS goal of 0 to -1 -Propofol and fentanyl as needed to maintain RASS goal -Avoid sedating medications as able -Daily wake up assessment -PRN Versed for breakthrough seizures -Seizure and Aspiration precautions -Repeat CT head is negative for acute intracranial process -Obtain EEG -UDS + for Benzodiazepines, Ethyl alcohol negative -Consult Neurology, appreciate input     Pt is critically ill.  High risk for cardiac arrest and death, prognosis is guarded.  Given current critical illness superimposed on multiple co-morbidities and advanced age, overall long term prognosis is poor.  Recommend DNR/DNI status, Palliative Care has been consulted to assist with GOC conversations.   Best Practice (right click and "Reselect all SmartList Selections" daily)   Diet/type: NPO, start tube feeds DVT prophylaxis: Start Heparin gtt GI prophylaxis: PPI Lines: Left IJ CVC, and is still needed due to vasopressors Foley:  yes, and is still needed Code Status:  full code Last date of multidisciplinary goals of care discussion [4/11]  4/11: Will  update pt's family when they arrive at bedside.  Labs   CBC: Recent Labs  Lab Nov 05, 2022 1531 11/14/2022 0115  WBC 5.4 10.5  HGB 11.2* 10.1*  HCT 34.9* 30.5*  MCV 102.6* 99.0  PLT 314 323     Basic Metabolic Panel: Recent Labs  Lab 11/05/2022 1531 2022-11-05 1826 10/30/2022 0115  NA 133*  --  138  137  K 3.3*  --  3.8  3.7  CL 107  --  106  106  CO2 15*  --  24  23  GLUCOSE 466*  --  99  97  BUN 19  --  21  22  CREATININE 0.87  --  0.87  0.83  CALCIUM 6.1*  --  8.8*  8.8*  MG  --  1.5* 3.1*  PHOS 2.8  --  2.8  2.8    GFR: Estimated Creatinine Clearance: 36.5 mL/min (by C-G formula based on SCr of 0.87 mg/dL). Recent Labs  Lab 2022/11/05 1531 11/14/2022 0115  PROCALCITON  --  0.18  WBC 5.4 10.5   LATICACIDVEN  --  1.7     Liver Function Tests: Recent Labs  Lab 2022-11-05 1826 11/09/2022 0115  AST 122* 75*  ALT 65* 54*  ALKPHOS 314* 279*  BILITOT 1.0 0.6  PROT 6.4* 5.7*  ALBUMIN 3.0* 2.7*  2.7*   No results for input(s): "LIPASE", "AMYLASE" in the last 168 hours. No results for input(s): "AMMONIA" in the last 168 hours.  ABG    Component Value Date/Time   PHART 7.34 (L) November 05, 2022 1700   PCO2ART 35 11/05/22 1700   PO2ART 128 (H) 2022/11/05 1700   HCO3 25.3 10/27/2022 0043   TCO2 19 (L) 03/29/2020 2033   ACIDBASEDEF 6.1 (H) Nov 05, 2022 1700   O2SAT 78 10/25/2022 0043     Coagulation Profile: Recent Labs  Lab 10/23/2022 0115  INR 1.4*    Cardiac Enzymes: No results for input(s): "CKTOTAL", "CKMB", "CKMBINDEX", "TROPONINI" in the last 168 hours.  HbA1C: Hgb A1c MFr Bld  Date/Time Value Ref Range Status  12/18/2014 10:58 AM 5.3 4.6 - 6.5 % Final    Comment:    Glycemic Control Guidelines for People with Diabetes:Non Diabetic:  <6%Goal of Therapy: <7%Additional Action Suggested:  >8%     CBG: Recent Labs  Lab 11-05-2022 1543 2022/11/05 1937 Nov 05, 2022 2201 2022-11-05 2315 11/12/2022 0309  GLUCAP 285* 232* 145* 115* 80     Review of Systems:   Unable to assess due to intubation/sedation/critical illness   Past Medical History:  She,  has a past medical history of (HFpEF) heart failure with preserved ejection fraction, Acute CHF (congestive heart failure) (09/18/2020), Alcohol abuse (04/05/2020), Breast cancer (2001), Cancer (2001), Carotid arterial disease, Closed right hip fracture (09/20/2019), GERD (gastroesophageal reflux disease), Hyperlipidemia, Hypertension, Osteoarthritis, multiple sites, Osteopenia, Persistent atrial fibrillation, Personal history of radiation therapy (2001), Psoriasis, and Wide-complex tachycardia.   Surgical History:   Past Surgical History:  Procedure Laterality Date   ABDOMINAL HYSTERECTOMY     ANKLE FRACTURE SURGERY  4/08    left---hardware still in place   ATRIAL FIBRILLATION ABLATION N/A 05/01/2022   Procedure: ATRIAL FIBRILLATION ABLATION;  Surgeon: Lanier Prude, MD;  Location: [facility] IN[facility]AS[facility];  Service: Cardiovascular;  Laterality: N/A;   BREAST BIOPSY Left 2001   breast ca   BREAST EXCISIONAL BIOPSY Left yrs ago   benign   BREAST LUMPECTOMY Left 2001   f/u radiation   CARDIOVERSION N/A 10/04/2017   Procedure: CARDIOVERSION;  Surgeon:  Iran Ouch, MD;  Location: [facility] ORS;  Service: Cardiovascular;  Laterality: N/A;   CARDIOVERSION N/A 12/16/2017   Procedure: CARDIOVERSION;  Surgeon: Duke Salvia, MD;  Location: [facility] ORS;  Service: Cardiovascular;  Laterality: N/A;   CARDIOVERSION N/A 06/24/2020   Procedure: CARDIOVERSION;  Surgeon: Iran Ouch, MD;  Location: [facility] ORS;  Service: Cardiovascular;  Laterality: N/A;   CARDIOVERSION N/A 08/10/2022   Procedure: CARDIOVERSION;  Surgeon: Iran Ouch, MD;  Location: [facility] ORS;  Service: Cardiovascular;  Laterality: N/A;   CAROTID ENDARTERECTOMY Left 10/23/2004   CORONARY/GRAFT ACUTE MI REVASCULARIZATION N/A 05-Nov-2022   Procedure: Coronary/Graft Acute MI Revascularization;  Surgeon: Marcina Millard, MD;  Location: [facility] INVASIVE CV LAB;  Service: Cardiovascular;  Laterality: N/A;   FRACTURE SURGERY     LEFT HEART CATH AND CORONARY ANGIOGRAPHY N/A 11-05-22   Procedure: LEFT HEART CATH AND CORONARY ANGIOGRAPHY;  Surgeon: Marcina Millard, MD;  Location: [facility] INVASIVE CV LAB;  Service: Cardiovascular;  Laterality: N/A;   OOPHORECTOMY     SHOULDER SURGERY  6/07   left   TEMPORARY PACEMAKER N/A 11/05/22   Procedure: TEMPORARY PACEMAKER;  Surgeon: Marcina Millard, MD;  Location: [facility] INVAS[facility] CV LAB;  Service: Cardiovascular;  Laterality: N/A;   TONSILLECTOMY AND ADENOIDECTOMY     TOTAL HIP ARTHROPLASTY  2004   right     Social History:   reports that she quit smoking about 27 years ago. Her smoking use included cigarettes. She  has a 40.00 pack-year smoking history. She has never used smokeless tobacco. She reports current alcohol use of about 14.0 standard drinks of alcohol per week. She reports that she does not use drugs.   Family History:  Her family history includes Hypertension in her brother; Leukemia in her father; Pneumonia in her mother. There is no history of Heart disease, Diabetes, or Breast cancer.   Allergies No Known Allergies   Home Medications  Prior to Admission medications   Medication Sig Start Date End Date Taking? Authorizing Provider  acetaminophen (TYLENOL) 325 MG tablet Take 2 tablets (650 mg total) by mouth every 4 (four) hours as needed for headache or mild pain. 09/19/20   Swayze, Ava, DO  allopurinol (ZYLOPRIM) 100 MG tablet TAKE 1.5 TABLETS (150 MG TOTAL) BY MOUTH DAILY 08/07/22   Glori Luis, MD  amiodarone (PACERONE) 200 MG tablet Take 1 tablet (200 mg total) by mouth 2 (two) times daily. 07/27/22   Fenton, Clint R, PA  amoxicillin-clavulanate (AUGMENTIN) 500-125 MG tablet Take 1 tablet by mouth in the morning and at bedtime. 10/27/22   Glori Luis, MD  apixaban (ELIQUIS) 2.5 MG TABS tablet TAKE 1 TABLET BY MOUTH TWICE A DAY 05/13/22   Lanier Prude, MD  cholecalciferol (VITAMIN D) 25 MCG (1000 UNIT) tablet Take 2 tablets (2,000 Units total) by mouth daily. 09/20/20   Swayze, Ava, DO  gabapentin (NEURONTIN) 100 MG capsule Take 1 capsule (100 mg total) by mouth 3 (three) times daily. 08/26/22   Leeroy Bock, MD  LORazepam (ATIVAN) 0.5 MG tablet TAKE 1 TABLET BY MOUTH TWICE A DAY AS NEEDED FOR ANXIETY 10/23/22   Glori Luis, MD  metoprolol tartrate (LOPRESSOR) 25 MG tablet Take 1 tablet (25 mg total) by mouth 3 (three) times daily. 10/27/22   Duke Salvia, MD  mirtazapine (REMERON) 30 MG tablet Take 1 tablet (30 mg total) by mouth at bedtime. 06/09/22   Glori Luis, MD  Multiple Vitamin (MULTIVITAMIN) capsule Take 1 capsule by  mouth daily.    [provider]  Multiple Vitamins-Minerals (PRESERVISION AREDS 2+MULTI VIT PO) Take 1 capsule by mouth in the morning and at bedtime.    [provider]  predniSONE (DELTASONE) 20 MG tablet Take 2 tablets (40 mg total) by mouth daily with breakfast. 10/27/22   Glori Luis, MD     Critical care time: 40 minutes     Harlon Ditty, AGACNP-BC [facility] Prefer epic messenger for cross cover needs If after hours, please call E-link

## 2022-11-18 NOTE — Consult Note (Signed)
Consultation Note Date: 11/12/22   Patient Name: [name]  DOB: 10/03/1940  MRN: [id number]  Age / Sex: 82 y.o., female  PCP: Glori Luis, MD Referring Physician: Erin Fulling, MD  Reason for Consultation: Establishing goals of care and Withdrawal of life-sustaining treatment  HPI/Patient Profile: 82 y.o. female  with past medical history of nonobstructive CAD by cardiac CT August 2023, persistent A-fib/Eliquis with prior cardioversion/ablation with A-fib recurrence, anemia, HTN, HFpEF, admitted on 11/05/2022 with V. tach.   Clinical Assessment and Goals of Care: I have reviewed medical records including EPIC notes, labs and imaging, received report from RN, assessed the patient.  Mrs. Hillstead is lying quietly in bed.  She is intubated/ventilated and sedated.  She cannot respond to me in any meaningful way.  She is surrounded by her loving family including her son Jesusita Oka, daughter Noreene Larsson, and healthcare power of attorney Amy via telephone..  We meet at the bedside to discuss diagnosis prognosis, GOC, EOL wishes, disposition and options.  I introduced Palliative Medicine as specialized medical care for people living with serious illness. It focuses on providing relief from the symptoms and stress of a serious illness. The goal is to improve quality of life for both the patient and the family.  We focused on their current illness.  We talk about Mrs. Sylla's acute health concerns.  We talk about ventilator support and medications via IV.  We talk about her heart issues.  We talked about her advanced directives completed and signed by her own hand February of this year requesting DNR.  The difference between aggressive medical intervention and comfort care was considered in light of the patient's goals of care. The natural disease trajectory and expectations at EOL were discussed.  Advanced directives,  concepts specific to code status, artifical feeding and hydration, and rehospitalization were considered and discussed.  All adult children endorsed DNR as signed by Mrs. Seilers own hand in February of this year.  They endorse her desire for DNR and a natural death.  We talk about prognosis with permission.  Discussed the importance of continued conversation with family and the medical providers regarding overall plan of care and treatment options, ensuring decisions are within the context of the patient's values and GOCs.  Questions and concerns were addressed.  The family was encouraged to call with questions or concerns.  PMT will continue to support holistically.  Conference with attending MD and NP, neurology, bedside nursing staff, transition of care team related to patient condition, needs, goals of care, desire for more comfort and dignity at end-of-life, compassionate extubation.   HCPOA HCPOA -daughter, Jannette Fogo.  Son Sedalia Muta and daughter Noreene Larsson are present and in agreement with compassionate extubation/comfort care.    SUMMARY OF RECOMMENDATIONS   Compassionate extubation Comfort and dignity at end-of-life End-of-life order set implemented   Code Status/Advance Care Planning: DNR  Symptom Management:  End-of-life order set in place  Palliative Prophylaxis:  Frequent Pain Assessment  Additional Recommendations (Limitations, Scope, Preferences): Full Comfort Care  Psycho-social/Spiritual:  Desire for further Chaplaincy support:no Additional Recommendations: Grief/Bereavement Support  Prognosis:  Hours - Days  Discharge Planning: Anticipated Hospital Death      Primary Diagnoses: Present on Admission:  Ventricular tachycardia (paroxysmal)   I have reviewed the medical record, interviewed the patient and family, and examined the patient. The following aspects are pertinent.  Past Medical History:  Diagnosis Date   (HFpEF) heart failure with preserved ejection  fraction    a. 08/2017 Echo: EF 55-60%, no rwma, mild MR, mildly dil LA, nl RV fxn; b. 03/2020 Echo: EF 60-65%, no rwma, Gr2 DD. RVSP 44.267mmHg. Mod dil LA. Mild MR; c. 09/2020 Echo: EF 50-55%, no rwma, mild LVH, mod red RV fxn, mildly dily RA.   Acute CHF (congestive heart failure) 09/18/2020   Alcohol abuse 04/05/2020   Breast cancer 2001   left breast   Cancer 2001   left breast ca   Carotid arterial disease    a. 10/2004 s/p L CEA; b. 12/2015 Carotid U/S: RICA 1-39%; b. LICA patent CEA site.   Closed right hip fracture 09/20/2019   GERD (gastroesophageal reflux disease)    Hyperlipidemia    Hypertension    Osteoarthritis, multiple sites    Osteopenia    Persistent atrial fibrillation    a. Dx 08/2017; b. CHA2DS2VASc = 6-->Pradaxa; c. 09/2017 Successful DCCV (second shock - 200J); d. 10/2017 Recurrent Afib-->flecainide started 11/2017; e. 06/2020 s/p DCCV (200J x 1); f. 06/2020 Recurrent AF->Flec inc 75bid; g. 09/2020 WCT->flec d/c'd->amio started.   Personal history of radiation therapy 2001   left breast ca   Psoriasis    Wide-complex tachycardia    a. 09/2020 in setting of presumed Flecainide toxicity.  Flecainide d/c'd.   Social History   Socioeconomic History   Marital status: Widowed    Spouse name: Not on file   Number of children: Not on file   Years of education: Not on file   Highest education level: Not on file  Occupational History   Occupation: Marketing    Comment: Retired  Tobacco Use   Smoking status: Former    Packs/day: 1.00    Years: 40.00    Additional pack years: 0.00    Total pack years: 40.00    Types: Cigarettes    Quit date: 07/21/1995    Years since quitting: 27.2   Smokeless tobacco: Never   Tobacco comments:    Former smoker 05/15/22  Vaping Use   Vaping Use: Never used  Substance and Sexual Activity   Alcohol use: Yes    Alcohol/week: 14.0 standard drinks of alcohol    Types: 7 Glasses of wine, 7 Standard drinks or equivalent per week    Comment: 1  glass of wine and 1 drink of liquor daily 05/15/22   Drug use: No   Sexual activity: Never  Other Topics Concern   Not on file  Social History Narrative   1 natural child   3 adopted children   Artist---still teaches water colors   Husband has Alzheimers      Has living will   Daughter Amy is health care POA   DNR    No tube feeds if cognitively unaware   Social Determinants of Health   Financial Resource Strain: Low Risk  (02/13/2022)   Overall Financial Resource Strain (CARDIA)    Difficulty of Paying Living Expenses: Not hard at all  Food Insecurity: No Food Insecurity (02/13/2022)   Hunger Vital Sign    Worried About Running Out of  Food in the Last Year: Never true    Ran Out of Food in the Last Year: Never true  Transportation Needs: No Transportation Needs (02/13/2022)   PRAPARE - Administrator, Civil Service (Medical): No    Lack of Transportation (Non-Medical): No  Physical Activity: Unknown (02/10/2021)   Exercise Vital Sign    Days of Exercise per Week: 0 days    Minutes of Exercise per Session: Not on file  Stress: No Stress Concern Present (02/13/2022)   [employer] of Occupational Health - Occupational Stress Questionnaire    Feeling of Stress : Not at all  Social Connections: Unknown (02/13/2022)   Social Connection and Isolation Panel [NHANES]    Frequency of Communication with Friends and Family: More than three times a week    Frequency of Social Gatherings with Friends and Family: More than three times a week    Attends Religious Services: Not on Marketing executive or Organizations: Not on file    Attends Banker Meetings: Not on file    Marital Status: Not on file   Family History  Problem Relation Age of Onset   Hypertension Brother    Pneumonia Mother    Leukemia Father    Heart disease Neg Hx    Diabetes Neg Hx    Breast cancer Neg Hx    Scheduled Meds: Continuous Infusions:  fentaNYL infusion  INTRAVENOUS Stopped (11/03/2022 1013)   propofol (DIPRIVAN) infusion Stopped (11/16/2022 1013)   PRN Meds:.acetaminophen **OR** acetaminophen, antiseptic oral rinse, glycopyrrolate **OR** glycopyrrolate **OR** glycopyrrolate, haloperidol **OR** haloperidol **OR** haloperidol lactate, LORazepam, midazolam, ondansetron **OR** ondansetron (ZOFRAN) IV, polyvinyl alcohol, sodium chloride flush Medications Prior to Admission:  Prior to Admission medications   Medication Sig Start Date End Date Taking? Authorizing Provider  acetaminophen (TYLENOL) 325 MG tablet Take 2 tablets (650 mg total) by mouth every 4 (four) hours as needed for headache or mild pain. 09/19/20   Swayze, Ava, DO  allopurinol (ZYLOPRIM) 100 MG tablet TAKE 1.5 TABLETS (150 MG TOTAL) BY MOUTH DAILY 08/07/22   Glori Luis, MD  amiodarone (PACERONE) 200 MG tablet Take 1 tablet (200 mg total) by mouth 2 (two) times daily. 07/27/22   Fenton, Clint R, PA  amoxicillin-clavulanate (AUGMENTIN) 500-125 MG tablet Take 1 tablet by mouth in the morning and at bedtime. 10/27/22   Glori Luis, MD  apixaban (ELIQUIS) 2.5 MG TABS tablet TAKE 1 TABLET BY MOUTH TWICE A DAY 05/13/22   Lanier Prude, MD  cholecalciferol (VITAMIN D) 25 MCG (1000 UNIT) tablet Take 2 tablets (2,000 Units total) by mouth daily. 09/20/20   Swayze, Ava, DO  gabapentin (NEURONTIN) 100 MG capsule Take 1 capsule (100 mg total) by mouth 3 (three) times daily. 08/26/22   Leeroy Bock, MD  LORazepam (ATIVAN) 0.5 MG tablet TAKE 1 TABLET BY MOUTH TWICE A DAY AS NEEDED FOR ANXIETY 10/23/22   Glori Luis, MD  metoprolol tartrate (LOPRESSOR) 25 MG tablet Take 1 tablet (25 mg total) by mouth 3 (three) times daily. 10/27/22   Duke Salvia, MD  mirtazapine (REMERON) 30 MG tablet Take 1 tablet (30 mg total) by mouth at bedtime. 06/09/22   Glori Luis, MD  Multiple Vitamin (MULTIVITAMIN) capsule Take 1 capsule by mouth daily.    [provider]  Multiple  Vitamins-Minerals (PRESERVISION AREDS 2+MULTI VIT PO) Take 1 capsule by mouth in the morning and at bedtime.    [provider]  predniSONE (DELTASONE) 20 MG tablet Take 2 tablets (40 mg total) by mouth daily with breakfast. 10/27/22   Glori Luis, MD   No Known Allergies Review of Systems  Unable to perform ROS: Intubated    Physical Exam Vitals and nursing note reviewed.     Vital Signs: BP (!) 68/51   Pulse (!) 59   Temp (!) 97.2 F (36.2 C)   Resp (!) 22   Ht 5\' 7"  (1.702 m)   Wt 47.2 kg   SpO2 99%   BMI 16.29 kg/m  Pain Scale: CPOT POSS *See Group Information*: 1-Acceptable,Awake and alert Pain Score: 0-No pain   SpO2: SpO2: 99 % O2 Device:SpO2: 99 % O2 Flow Rate: .   IO: Intake/output summary:  Intake/Output Summary (Last 24 hours) at 11/03/2022 1228 Last data filed at 10/20/2022 1111 Gross per 24 hour  Intake 1547.29 ml  Output 945 ml  Net 602.29 ml    LBM: Last BM Date :  (PTA) Baseline Weight: Weight: 47.2 kg Most recent weight: Weight: 47.2 kg     Palliative Assessment/Data:     Time In: 0820 Time Out: 0935 Time Total: 75 minutes  Greater than 50%  of this time was spent counseling and coordinating care related to the above assessment and plan.  Signed by: Katheran Awe, NP   Please contact Palliative Medicine Team phone at [phone] for questions and concerns.  For individual provider: See Loretha Stapler

## 2022-11-18 NOTE — Death Summary Note (Signed)
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 Cardiogenic Shock Elevated Troponin Acute Hypoxic Respiratory Failure Aspiration Pneumonitis RSV Infection Hypocalcemia Hypokalemia Hyponatremia Anion Gap Metabolic Acidosis Mild Transaminitis  Hyperglycemia Seizure Like Activity  Diagnoses  Preliminary cause of death:  Nonischemic Cardiomyopathy Secondary Diagnoses (including complications and co-morbidities):  Principal Problem:   Ventricular tachycardia (paroxysmal) Atrial Fibrillation with slow ventricular response 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   EMS noted her to be in V. Tach of which she was given Amiodarone.  This was followed by brief episode of seizure like activity (tremor of all  extremities) and unconsciousness.  She then awoke and was not postictal.  Upon arrival to the ED she was bradycardic (A. Fib), awake and alert, and normotensive.  She denied chest pain, but did report some generalized body aches/soreness.   ED Course: Initial Vital Signs: Temperature , Pulse 51, BP 123/65, SpO2 99% Significant Labs: Sodium 133, potassium 3.3, bicarb 15, glucose 466, calcium 6.1, anion gap 11, high-sensitivity troponin 32, WBC 5.4, hemoglobin 11.2 Imaging Chest X-ray>>IMPRESSION: 1. Endotracheal tube tip about 2 cm superior to carina. 2. Esophageal tube tip at the level of diaphragm, side-port at the level of distal esophagus. Further advancement is recommended for more optimal positioning. 3. Interval left greater than right interstitial and ground-glass opacity, suspect for edema.   While in the ED she had several runs of V. Tach, at one point losing consciousness.  She was placed Amiodarone infusion, and code STEMI was called for evaluation of temporary pacer.  Later she did briefly lost pulses for about 20 seconds of which received CPR and defibrillation x2 with return of pulses. Decision was made to intubate, and to take her emergently to the Cath Lab for emergent transvenous pacemaker placement.   She returns to ICU post Cath and remains intubated.  PCCM consulted for further workup and treatment, and management of vent.   Please see "Significant Hospital Events" section below for full detailed hospital course.  Significant Hospital Events: Including procedures, antibiotic start and stop dates in addition to other pertinent events   2022/11/24: Presented to ED with seizure like activity and syncope.  Found to be in V. Tach, briefly lost pulses for 20 seconds requiring CPR and defibrillation x2.  Cardiology took emergently to Cath Lab for [facility] and pacer placement.  Returns to ICU and remains intubated.  PCCM consulted. 11-25-2022: Remains on vent, minimal settings, will trial SBT as  tolerated.  Requiring Levophed.  + for RSV.  Echocardiogram with new  decrease in LVEF to 35-40% and moderate reduction in RV function, advanced CHF consulted, concern for Cardiogenic shock. Starting Heparin for anticoagulation.  RUQ [place] is pending for mild transaminitis. Palliative Care consulted for GOC05-09-24: Pt's family decided to transition to COMFORT MEASURES ONLY and withdraw care.  Pt expired shortly after withdrawal of care.  Pertinent Labs and Studies  Significant Diagnostic Studies US Abdomen Limited RUQ (LIVER/GB)  Result Date: 2022/11/26 CLINICAL DATA:  82 year old with transaminitis. EXAM: ULTRASOUND ABDOMEN LIMITED RIGHT UPPER QUADRANT COMPARISON:  CT chest, abdomen and pelvis 08/14/2022 FINDINGS: Gallbladder: Gallbladder is mildly distended without wall thickening. There is a small amount of echogenic sludge. No definite stones. Reportedly, patient was not awake and cannot evaluate for sonographic Murphy sign. Common bile duct: Diameter: 0.3 cm Liver: No focal lesion identified. Liver parenchyma is within normal limits. Small amount of perihepatic fluid. No evidence for intrahepatic biliary dilatation. Portal vein is patent on color Doppler imaging with normal direction of blood flow towards the liver. Other: Atherosclerotic disease in the abdominal aorta. Right pleural effusion. Trace fluid around the right kidney. IMPRESSION: 1. Gallbladder is mildly distended with sludge. No definite gallstones. 2. Small amount of right upper quadrant ascites. Right pleural effusion. Electronically Signed   By: Richarda Overlie M.D.   On: 2022-11-26 10:24   ECHOCARDIOGRAM COMPLETE  Result Date: 2022-11-26    ECHOCARDIOGRAM REPORT   Patient Name:   [name] Date of Exam: 11/12/2022 Medical Rec #:  [id number]          Height:       67.0 in Accession #:    [id number]         Weight:       104.0 lb Date of Birth:  12-17-1940          BSA:          1.532 m Patient Age:    81 years           BP:            87/64 mmHg Patient Gender: F                  HR:           60 bpm. Exam Location:  [facility] Procedure: 2D Echo, Cardiac Doppler and Color Doppler Indications:     I47.2 Ventricular Tachycardia.  History:         Patient has prior history of Echocardiogram examinations, most                  recent 08/21/2022. CHF, Arrythmias:Atrial Fibrillation; Risk                  Factors:Hypertension and Dyslipidemia.  Sonographer:     Daphine Deutscher RDCS Referring Phys:  [id number] Francee Nodal FURTH Diagnosing Phys: Julien Nordmann MD IMPRESSIONS  1. Left ventricular ejection fraction, by estimation, is 35 to 40%. The left ventricle has moderately decreased function. The left ventricle demonstrates mild global hypokinesis with severe hypokinesis of the anterior/anteroseptal regions. There is mild  left ventricular hypertrophy.  2. Right ventricular systolic function is moderately reduced. The right ventricular size is normal. There is normal pulmonary artery systolic pressure. The estimated right ventricular systolic pressure is 20.2 mmHg.  3. Left atrial size was mild to moderately dilated.  4. The mitral valve is normal in structure. Mild mitral valve regurgitation. No evidence of mitral stenosis.  5. Tricuspid valve regurgitation is mild to moderate.  6. The  aortic valve is tricuspid. Aortic valve regurgitation is not visualized. Aortic valve sclerosis is present, with no evidence of aortic valve stenosis.  7. The inferior vena cava is dilated in size with <50% respiratory variability, suggesting right atrial pressure of 15 mmHg. FINDINGS  Left Ventricle: Left ventricular ejection fraction, by estimation, is 35 to 40%. The left ventricle has moderately decreased function. The left ventricle demonstrates global hypokinesis. The left ventricular internal cavity size was normal in size. There is mild left ventricular hypertrophy. Left ventricular diastolic parameters are indeterminate. Right Ventricle: The right ventricular size  is normal. No increase in right ventricular wall thickness. Right ventricular systolic function is moderately reduced. There is normal pulmonary artery systolic pressure. The tricuspid regurgitant velocity is 1.95 m/s, and with an assumed right atrial pressure of 5 mmHg, the estimated right ventricular systolic pressure is 20.2 mmHg. Left Atrium: Left atrial size was mild to moderately dilated. Right Atrium: Right atrial size was normal in size. Pericardium: There is no evidence of pericardial effusion. Mitral Valve: The mitral valve is normal in structure. There is mild thickening of the mitral valve leaflet(s). There is mild calcification of the mitral valve leaflet(s). Mild mitral valve regurgitation. No evidence of mitral valve stenosis. Tricuspid Valve: The tricuspid valve is normal in structure. Tricuspid valve regurgitation is mild to moderate. No evidence of tricuspid stenosis. Aortic Valve: The aortic valve is tricuspid. Aortic valve regurgitation is not visualized. Aortic valve sclerosis is present, with no evidence of aortic valve stenosis. Pulmonic Valve: The pulmonic valve was normal in structure. Pulmonic valve regurgitation is mild. No evidence of pulmonic stenosis. Aorta: The aortic root is normal in size and structure. Venous: The inferior vena cava is dilated in size with less than 50% respiratory variability, suggesting right atrial pressure of 15 mmHg. IAS/Shunts: No atrial level shunt detected by color flow Doppler.  LEFT VENTRICLE PLAX 2D LVIDd:         3.80 cm LVIDs:         2.60 cm LV PW:         1.20 cm LV IVS:        1.00 cm LVOT diam:     1.90 cm LV SV:         31 LV SV Index:   20 LVOT Area:     2.84 cm  RIGHT VENTRICLE            IVC RV Basal diam:  3.60 cm    IVC diam: 2.30 cm RV S prime:     6.56 cm/s TAPSE (M-mode): 1.1 cm LEFT ATRIUM             Index        RIGHT ATRIUM           Index LA diam:        3.90 cm 2.55 cm/m   RA Area:     12.80 cm LA Vol (A2C):   63.5 ml 41.46 ml/m   RA Volume:   34.30 ml  22.40 ml/m LA Vol (A4C):   56.9 ml 37.15 ml/m LA Biplane Vol: 60.8 ml 39.70 ml/m  AORTIC VALVE LVOT Vmax:   54.50 cm/s LVOT Vmean:  32.700 cm/s LVOT VTI:    0.109 m  AORTA Ao Root diam: 3.10 cm Ao Asc diam:  3.40 cm MV E velocity: 61.83 cm/s  TRICUSPID VALVE  TR Peak grad:   15.2 mmHg                            TR Vmax:        195.00 cm/s                             SHUNTS                            Systemic VTI:  0.11 m                            Systemic Diam: 1.90 cm Julien Nordmann MD Electronically signed by Julien Nordmann MD Signature Date/Time: 17-Nov-2022/7:26:21 AM    Final    DG Chest Port 1 View  Result Date: 17-Nov-2022 CLINICAL DATA:  Status post central line placement EXAM: PORTABLE CHEST 1 VIEW COMPARISON:  Film from the previous day. FINDINGS: Endotracheal tube and gastric catheter are seen and stable. Transvenous pacing wire is again noted and stable. New left jugular central line is seen with the catheter tip at the cavoatrial junction. No pneumothorax is noted. Diffuse interstitial changes are again identified. IMPRESSION: No pneumothorax following central line placement. The remainder of the exam is stable. Electronically Signed   By: Alcide Clever M.D.   On: 11-17-22 01:16   DG Chest Port 1 View  Result Date: 10/26/2022 CLINICAL DATA:  Evaluate nasogastric tube. EXAM: PORTABLE CHEST 1 VIEW COMPARISON:  October 28, 2022 (6:19 p.m.) FINDINGS: There is stable endotracheal tube and ascending pacing lead wire positioning. A nasogastric tube is seen with its distal end looped within the body of the stomach. The heart size and mediastinal contours are within normal limits. Diffuse bilateral interstitial opacities are again seen, slightly more prominent within the left upper lobe. There is no evidence of a pleural effusion or pneumothorax. The visualized skeletal structures are unremarkable. IMPRESSION: 1. Nasogastric tube positioning, as described  above. 2. Stable, diffuse bilateral interstitial opacities. Electronically Signed   By: Aram Candela M.D.   On: 11/13/2022 21:10   DG Chest Port 1 View  Result Date: 11/03/2022 CLINICAL DATA:  Intubated EXAM: PORTABLE CHEST 1 VIEW COMPARISON:  10/19/2022 FINDINGS: Endotracheal tube tip is about 2 cm superior to carina. Esophageal tube tip at the level of diaphragm, side-port at the level of distal esophagus. Borderline cardiomegaly. Ascending pacing lead with tip over the RV. Interval left greater than right interstitial and ground-glass opacity. No pleural effusion or pneumothorax. IMPRESSION: 1. Endotracheal tube tip about 2 cm superior to carina. 2. Esophageal tube tip at the level of diaphragm, side-port at the level of distal esophagus. Further advancement is recommended for more optimal positioning. 3. Interval left greater than right interstitial and ground-glass opacity, suspect for edema. These results will be called to the ordering clinician or representative by the Radiologist Assistant, and communication documented in the PACS or [employer]. Electronically Signed   By: Jasmine Pang M.D.   On: 11/05/2022 18:35   CT HEAD WO CONTRAST ( )  Result Date: 10/24/2022 CLINICAL DATA:  Syncope/presyncope. Cerebrovascular cause suspected. Seizure like activity. EXAM: CT HEAD WITHOUT CONTRAST TECHNIQUE: Contiguous axial images were obtained from the base of the skull through the vertex without intravenous contrast. RADIATION DOSE REDUCTION: This exam was performed according to the departmental dose-optimization program  which includes automated exposure control, adjustment of the mA and/or kV according to patient size and/or use of iterative reconstruction technique. COMPARISON:  10/27/2022 FINDINGS: Brain: Intravascular contrast is present presumably due to some other prior procedure. No focal abnormality affects the brainstem or cerebellum. Cerebral hemispheres show atrophy and chronic  small-vessel ischemic change of the white matter but no acute stroke, mass, hemorrhage, hydrocephalus or extra-axial collection. No abnormal enhancement occurs. Vascular: Major vessels at the base of the brain show flow. Skull: Negative Sinuses/Orbits: Sinus inflammatory changes as noted on yesterday's exam. Other: None IMPRESSION: No acute CT finding. Atrophy and chronic small-vessel ischemic change of the white matter. Intravascular contrast is present from some other procedure and [place] this examination serves as a postcontrast head CT. Electronically Signed   By: Paulina Fusi M.D.   On: 11/13/2022 18:11   CARDIAC CATHETERIZATION  Result Date: 11/03/2022   Mid LM to Dist LM lesion is 20% stenosed.   Prox LAD to Mid LAD lesion is 50% stenosed.   Dist LAD lesion is 60% stenosed.   Prox RCA lesion is 40% stenosed.   There is mild left ventricular systolic dysfunction.   The left ventricular ejection fraction is 35-45% by visual estimate. 1.  Nonobstructive coronary artery disease 2.  Mildly reduced left ventricular function with estimated LVEF 40% with anterior apical hypokinesis.  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 3.  Sinus bradycardia, junctional bradycardia, status post temporary pacemaker via right femoral vein 4.  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  Medical therapy 2.  DC amiodarone infusion 3.  Temporary pacemaker at at 60 bpm 4.  EP evaluation in a.m. 5.  2D echocardiogram in a.m.   CT HEAD WO CONTRAST ( )  Result Date: 10/27/2022 CLINICAL DATA:  82 year old female sudden severe headache on anticoagulation. EXAM: CT HEAD WITHOUT CONTRAST TECHNIQUE: Contiguous axial images were obtained from the base of the skull through the vertex without intravenous contrast. RADIATION DOSE REDUCTION: This exam was performed according to the departmental dose-optimization program which includes automated exposure  control, adjustment of the mA and/or kV according to patient size and/or use of iterative reconstruction technique. COMPARISON:  Brain MRI 10/20/2004.  Head CT 11/15/2021. FINDINGS: Brain: Stable cerebral volume. No midline shift, ventriculomegaly, mass effect, evidence of mass lesion, intracranial hemorrhage or evidence of cortically based acute infarction. Patchy bilateral cerebral white matter hypodensity appears stable from last year. Vascular: Calcified atherosclerosis at the skull base. No suspicious intracranial vascular hyperdensity. Skull: No acute osseous abnormality identified. Sinuses/Orbits: Widespread paranasal sinus mucosal thickening with some bubbly opacity and occasional sinus opacification (left sphenoid) is new from last year. Tympanic cavities and mastoids remain clear. Other: No acute orbit or scalp soft tissue finding. IMPRESSION: 1. No acute intracranial abnormality. Stable non contrast CT appearance of mild to moderate for age cerebral white matter changes. 2. Widespread paranasal sinus inflammation is new from last year. Consider Acute Sinusitis. Electronically Signed   By: Odessa Fleming M.D.   On: 10/27/2022 10:31   DG Chest 2 View  Result Date: 10/20/2022 CLINICAL DATA:  SOB chronic cough EXAM: CHEST - 2 VIEW COMPARISON:  08/25/2022 FINDINGS: The heart size and mediastinal contours are within normal limits. Lungs are hyperinflated suggesting COPD. There is no focal consolidation. No pneumothorax or pleural effusion. Chronic bilateral rib fractures. Aorta calcified. IMPRESSION: Hyperinflated lungs consistent with COPD. Pulmonary vascular congestion without focal consolidation. Electronically Signed   By: Layla Maw M.D.   On: 10/20/2022 18:37  Microbiology Recent Results (from the past 240 hour(s))  SARS Coronavirus 2 by RT PCR (hospital order, performed in [facility] hospital lab) *cepheid single result test* Anterior Nasal Swab     Status: None   Collection Time: 11/20/22   6:42 PM   Specimen: Anterior Nasal Swab  Result Value Ref Range Status   SARS Coronavirus 2 by RT PCR NEGATIVE NEGATIVE Final    Comment: (NOTE) SARS-CoV-2 target nucleic acids are NOT DETECTED.  The SARS-CoV-2 RNA is generally detectable in upper and lower respiratory specimens during the acute phase of infection. The lowest concentration of SARS-CoV-2 viral copies this assay can detect is 250 copies / mL. A negative result does not preclude SARS-CoV-2 infection and should not be used as the sole basis for treatment or other patient management decisions.  A negative result may occur with improper specimen collection / handling, submission of specimen other than nasopharyngeal swab, presence of viral mutation(s) within the areas targeted by this assay, and inadequate number of viral copies (<250 copies / mL). A negative result must be combined with clinical observations, patient history, and epidemiological information.  Fact Sheet for Patients:   RoadLapTop.co.za  Fact Sheet for Healthcare Providers: http://kim-miller.com/  This test is not yet approved or  cleared by the [place] FDA and has been authorized for detection and/or diagnosis of SARS-CoV-2 by FDA under an Emergency Use Authorization (EUA).  This EUA will remain in effect (meaning this test can be used) for the duration of the COVID-19 declaration under Section 564(b)(1) of the Act, 21 U.S.C. section 360bbb-3(b)(1), unless the authorization is terminated or revoked sooner.  Performed at [facility], [address] [place], [place] [address]   Respiratory (~20 pathogens) panel by PCR     Status: Abnormal   Collection Time: 2022/11/20  6:42 PM   Specimen: Nasopharyngeal Swab; Respiratory  Result Value Ref Range Status   Adenovirus NOT DETECTED NOT DETECTED Final   Coronavirus 229E NOT DETECTED NOT DETECTED Final    Comment: (NOTE) The Coronavirus on the  Respiratory Panel, DOES NOT test for the novel  Coronavirus (2019 nCoV)    Coronavirus HKU1 NOT DETECTED NOT DETECTED Final   Coronavirus NL63 NOT DETECTED NOT DETECTED Final   Coronavirus OC43 NOT DETECTED NOT DETECTED Final   Metapneumovirus NOT DETECTED NOT DETECTED Final   Rhinovirus / Enterovirus NOT DETECTED NOT DETECTED Final   Influenza A NOT DETECTED NOT DETECTED Final   Influenza B NOT DETECTED NOT DETECTED Final   Parainfluenza Virus 1 NOT DETECTED NOT DETECTED Final   Parainfluenza Virus 2 NOT DETECTED NOT DETECTED Final   Parainfluenza Virus 3 NOT DETECTED NOT DETECTED Final   Parainfluenza Virus 4 NOT DETECTED NOT DETECTED Final   Respiratory Syncytial Virus DETECTED (A) NOT DETECTED Final   Bordetella pertussis NOT DETECTED NOT DETECTED Final   Bordetella Parapertussis NOT DETECTED NOT DETECTED Final   Chlamydophila pneumoniae NOT DETECTED NOT DETECTED Final   Mycoplasma pneumoniae NOT DETECTED NOT DETECTED Final    Comment: Performed at [facility], [address] [place], [place] [address]  MRSA Next Gen by PCR, Nasal     Status: None   Collection Time: 2022/11/20  6:42 PM   Specimen: Nasal Mucosa; Nasal Swab  Result Value Ref Range Status   MRSA by PCR Next Gen NOT DETECTED NOT DETECTED Final    Comment: (NOTE) The GeneXpert MRSA Assay (FDA approved for NASAL specimens only), is one component of a comprehensive MRSA  colonization surveillance program. It is not intended to diagnose MRSA infection nor to guide or monitor treatment for MRSA infections. Test performance is not FDA approved in patients less than [address] years old. Performed at [facility], [address] [place], [place] [id number]     Lab Basic Metabolic Panel: Recent Labs  Lab 10/26/2022 1531 11/04/2022 1826 2023-04-15 0115  NA 133*  --  138  137  K 3.3*  --  3.8  3.7  CL 107  --  106  106  CO2 15*  --  24  23  GLUCOSE 466*  --  99  97  BUN 19  --  21  22  CREATININE 0.87   --  0.87  0.83  CALCIUM 6.1*  --  8.8*  8.8*  MG  --  1.5* 3.1*  PHOS 2.8  --  2.8  2.8   Liver Function Tests: Recent Labs  Lab 11/16/2022 1826 2023-04-15 0115  AST 122* 75*  ALT 65* 54*  ALKPHOS 314* 279*  BILITOT 1.0 0.6  PROT 6.4* 5.7*  ALBUMIN 3.0* 2.7*  2.7*   No results for input(s): "LIPASE", "AMYLASE" in the last 168 hours. No results for input(s): "AMMONIA" in the last 168 hours. CBC: Recent Labs  Lab 11/06/2022 1531 2023-04-15 0115  WBC 5.4 10.5  HGB 11.2* 10.1*  HCT 34.9* 30.5*  MCV 102.6* 99.0  PLT 314 323   Cardiac Enzymes: No results for input(s): "CKTOTAL", "CKMB", "CKMBINDEX", "TROPONINI" in the last 168 hours. Sepsis Labs: Recent Labs  Lab 10/30/2022 1531 2023-04-15 0115  PROCALCITON  --  0.18  WBC 5.4 10.5  LATICACIDVEN  --  1.7    Procedures/Operations  4/10: Endotracheal intubation in ED 4/10: Cardiac Catheterization 4/11: Left IJ CVC placed    Harlon DittyJeremiah Loring Liskey, AGACNP-BC [facility] Prefer epic messenger for cross cover needs If after hours, please call E-link  Judithe ModestJeremiah D Canary Fister 12/29/22, 1:41 PM

## 2022-11-18 NOTE — IPAL (Signed)
  Interdisciplinary Goals of Care Family Meeting   Date carried out: 11/08/2022  Location of the meeting: Bedside  Member's involved: Physician, Bedside Registered Nurse, and Family Member or next of kin    GOALS OF CARE DISCUSSION  The Clinical status was relayed to family in detail-Son and Daughter  Updated and notified of patients medical condition- Patient remains unresponsive and will not open eyes to command.   Patient is having a weak cough and struggling to remove secretions.   Patient with increased WOB and using accessory muscles to breathe Explained to family course of therapy and the modalities   Patient with Progressive multiorgan failure with a very high probablity of a very minimal chance of meaningful recovery despite all aggressive and optimal medical therapy.    Family understands the situation.  Patient has signed a MOST form and family states that she does NOT want to be kept alive on machines We offered to wait 24-48 hrs but patient is suffering and family would NOT want to prolong suffering.   They have consented and agreed to DNR/DNI and would like to proceed with Comfort care measures.  Family are satisfied with Plan of action and management. All questions answered  Additional CC time 35 mins   Natale Barba Santiago Glad, M.D.  [facility]  Medical Director [facility] Medical Director [facility] Cardio-Pulmonary Department

## 2022-11-18 DEATH — deceased

## 2022-12-15 ENCOUNTER — Ambulatory Visit (HOSPITAL_COMMUNITY): Payer: Medicare Other | Admitting: Physician Assistant

## 2022-12-31 ENCOUNTER — Ambulatory Visit: Payer: Medicare Other | Admitting: Internal Medicine

## 2023-01-19 ENCOUNTER — Ambulatory Visit: Payer: Medicare Other | Admitting: Family Medicine

## 2023-01-28 ENCOUNTER — Ambulatory Visit: Payer: Medicare Other | Admitting: Podiatry

## 2023-12-15 ENCOUNTER — Other Ambulatory Visit: Payer: Self-pay
